# Patient Record
Sex: Male | Born: 1965
Health system: Southern US, Academic
[De-identification: ages and names within clinical notes are randomized; demographics above are authoritative.]

## PROBLEM LIST (undated history)

## (undated) ENCOUNTER — Telehealth
Attending: Student in an Organized Health Care Education/Training Program | Primary: Student in an Organized Health Care Education/Training Program

## (undated) ENCOUNTER — Encounter

## (undated) ENCOUNTER — Ambulatory Visit

## (undated) ENCOUNTER — Ambulatory Visit
Payer: MEDICAID | Attending: Student in an Organized Health Care Education/Training Program | Primary: Student in an Organized Health Care Education/Training Program

## (undated) ENCOUNTER — Telehealth: Attending: Clinical | Primary: Clinical

## (undated) ENCOUNTER — Encounter: Attending: Family | Primary: Family

## (undated) ENCOUNTER — Telehealth

## (undated) ENCOUNTER — Telehealth: Attending: Registered" | Primary: Registered"

## (undated) ENCOUNTER — Ambulatory Visit: Payer: MEDICAID

## (undated) ENCOUNTER — Encounter
Attending: Pharmacist Clinician (PhC)/ Clinical Pharmacy Specialist | Primary: Pharmacist Clinician (PhC)/ Clinical Pharmacy Specialist

## (undated) ENCOUNTER — Ambulatory Visit: Payer: MEDICAID | Attending: Addiction (Substance Use Disorder) | Primary: Addiction (Substance Use Disorder)

## (undated) ENCOUNTER — Encounter
Attending: Student in an Organized Health Care Education/Training Program | Primary: Student in an Organized Health Care Education/Training Program

## (undated) ENCOUNTER — Inpatient Hospital Stay: Payer: MEDICAID

## (undated) ENCOUNTER — Ambulatory Visit: Payer: MEDICAID | Attending: Clinical | Primary: Clinical

## (undated) ENCOUNTER — Ambulatory Visit
Attending: Student in an Organized Health Care Education/Training Program | Primary: Student in an Organized Health Care Education/Training Program

## (undated) ENCOUNTER — Inpatient Hospital Stay: Payer: Medicaid (Managed Care)

## (undated) ENCOUNTER — Ambulatory Visit: Attending: Addiction (Substance Use Disorder) | Primary: Addiction (Substance Use Disorder)

## (undated) ENCOUNTER — Ambulatory Visit
Payer: Medicaid (Managed Care) | Attending: Student in an Organized Health Care Education/Training Program | Primary: Student in an Organized Health Care Education/Training Program

## (undated) ENCOUNTER — Ambulatory Visit: Payer: MEDICAID | Attending: Registered" | Primary: Registered"

## (undated) ENCOUNTER — Encounter: Attending: Certified Registered" | Primary: Certified Registered"

## (undated) ENCOUNTER — Ambulatory Visit: Attending: Pharmacist | Primary: Pharmacist

## (undated) ENCOUNTER — Ambulatory Visit: Payer: MEDICAID | Attending: Urology | Primary: Urology

## (undated) ENCOUNTER — Ambulatory Visit: Payer: Medicaid (Managed Care)

## (undated) ENCOUNTER — Ambulatory Visit: Payer: MEDICAID | Attending: Acute Care | Primary: Acute Care

## (undated) ENCOUNTER — Telehealth: Payer: MEDICAID

## (undated) ENCOUNTER — Ambulatory Visit: Attending: Mental Health | Primary: Mental Health

## (undated) ENCOUNTER — Ambulatory Visit
Payer: Medicare (Managed Care) | Attending: Student in an Organized Health Care Education/Training Program | Primary: Student in an Organized Health Care Education/Training Program

## (undated) ENCOUNTER — Emergency Department: Admission: EM | Payer: MEDICAID

## (undated) DIAGNOSIS — F319 Bipolar disorder, unspecified: Secondary | ICD-10-CM

## (undated) DIAGNOSIS — I499 Cardiac arrhythmia, unspecified: Secondary | ICD-10-CM

## (undated) DIAGNOSIS — M009 Pyogenic arthritis, unspecified: Secondary | ICD-10-CM

## (undated) DIAGNOSIS — N400 Enlarged prostate without lower urinary tract symptoms: Secondary | ICD-10-CM

## (undated) DIAGNOSIS — M1990 Unspecified osteoarthritis, unspecified site: Secondary | ICD-10-CM

## (undated) DIAGNOSIS — T8859XA Other complications of anesthesia, initial encounter: Secondary | ICD-10-CM

## (undated) DIAGNOSIS — I10 Essential (primary) hypertension: Secondary | ICD-10-CM

## (undated) DIAGNOSIS — F1410 Cocaine abuse, uncomplicated: Secondary | ICD-10-CM

## (undated) DIAGNOSIS — R112 Nausea with vomiting, unspecified: Secondary | ICD-10-CM

## (undated) DIAGNOSIS — K219 Gastro-esophageal reflux disease without esophagitis: Secondary | ICD-10-CM

## (undated) DIAGNOSIS — I2510 Atherosclerotic heart disease of native coronary artery without angina pectoris: Secondary | ICD-10-CM

## (undated) DIAGNOSIS — B1920 Unspecified viral hepatitis C without hepatic coma: Secondary | ICD-10-CM

## (undated) DIAGNOSIS — F32A Depression, unspecified: Secondary | ICD-10-CM

## (undated) DIAGNOSIS — Z21 Asymptomatic human immunodeficiency virus [HIV] infection status: Secondary | ICD-10-CM

## (undated) DIAGNOSIS — B20 Human immunodeficiency virus [HIV] disease: Secondary | ICD-10-CM

## (undated) DIAGNOSIS — T783XXA Angioneurotic edema, initial encounter: Secondary | ICD-10-CM

## (undated) DIAGNOSIS — J40 Bronchitis, not specified as acute or chronic: Secondary | ICD-10-CM

## (undated) DIAGNOSIS — Z9889 Other specified postprocedural states: Secondary | ICD-10-CM

## (undated) DIAGNOSIS — A630 Anogenital (venereal) warts: Secondary | ICD-10-CM

## (undated) DIAGNOSIS — N179 Acute kidney failure, unspecified: Secondary | ICD-10-CM

## (undated) DIAGNOSIS — E785 Hyperlipidemia, unspecified: Secondary | ICD-10-CM

## (undated) DIAGNOSIS — J45909 Unspecified asthma, uncomplicated: Secondary | ICD-10-CM

## (undated) DIAGNOSIS — K439 Ventral hernia without obstruction or gangrene: Secondary | ICD-10-CM

## (undated) DIAGNOSIS — R001 Bradycardia, unspecified: Secondary | ICD-10-CM

## (undated) DIAGNOSIS — R7303 Prediabetes: Secondary | ICD-10-CM

## (undated) DIAGNOSIS — F329 Major depressive disorder, single episode, unspecified: Secondary | ICD-10-CM

## (undated) DIAGNOSIS — I471 Supraventricular tachycardia, unspecified: Secondary | ICD-10-CM

## (undated) DIAGNOSIS — T4145XA Adverse effect of unspecified anesthetic, initial encounter: Secondary | ICD-10-CM

## (undated) HISTORY — PX: HERNIA REPAIR: SHX51

## (undated) HISTORY — PX: TOE SURGERY: SHX1073

---
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
## 2005-05-18 ENCOUNTER — Emergency Department: Payer: Self-pay | Admitting: Emergency Medicine

## 2008-01-03 ENCOUNTER — Emergency Department: Payer: Self-pay | Admitting: Emergency Medicine

## 2008-06-01 ENCOUNTER — Emergency Department: Payer: Self-pay | Admitting: Emergency Medicine

## 2008-09-07 ENCOUNTER — Emergency Department: Payer: Self-pay | Admitting: Emergency Medicine

## 2008-09-10 ENCOUNTER — Other Ambulatory Visit: Payer: Self-pay

## 2008-09-10 ENCOUNTER — Observation Stay: Payer: Self-pay | Admitting: Internal Medicine

## 2008-09-14 ENCOUNTER — Emergency Department: Payer: Self-pay | Admitting: Emergency Medicine

## 2009-02-03 ENCOUNTER — Emergency Department: Payer: Self-pay | Admitting: Emergency Medicine

## 2009-03-12 ENCOUNTER — Emergency Department: Payer: Self-pay | Admitting: Emergency Medicine

## 2009-04-16 ENCOUNTER — Emergency Department: Payer: Self-pay | Admitting: Emergency Medicine

## 2009-06-22 ENCOUNTER — Emergency Department: Payer: Self-pay | Admitting: Emergency Medicine

## 2009-07-12 ENCOUNTER — Ambulatory Visit: Payer: Self-pay

## 2011-09-25 ENCOUNTER — Emergency Department: Payer: Self-pay | Admitting: Internal Medicine

## 2011-10-27 ENCOUNTER — Emergency Department: Payer: Self-pay | Admitting: Emergency Medicine

## 2012-01-21 ENCOUNTER — Emergency Department: Payer: Self-pay | Admitting: Unknown Physician Specialty

## 2012-04-16 ENCOUNTER — Emergency Department: Payer: Self-pay

## 2012-04-16 LAB — URINALYSIS, COMPLETE
Bacteria: NONE SEEN
Bilirubin,UR: NEGATIVE
Glucose,UR: NEGATIVE mg/dL (ref 0–75)
Ph: 5 (ref 4.5–8.0)
Protein: NEGATIVE
Squamous Epithelial: NONE SEEN

## 2012-04-29 ENCOUNTER — Emergency Department: Payer: Self-pay | Admitting: Emergency Medicine

## 2012-06-21 ENCOUNTER — Emergency Department: Payer: Self-pay | Admitting: Emergency Medicine

## 2013-05-08 ENCOUNTER — Inpatient Hospital Stay: Payer: Self-pay | Admitting: Internal Medicine

## 2013-05-08 LAB — COMPREHENSIVE METABOLIC PANEL
Albumin: 4.4 g/dL (ref 3.4–5.0)
Alkaline Phosphatase: 87 U/L (ref 50–136)
Anion Gap: 6 — ABNORMAL LOW (ref 7–16)
Co2: 26 mmol/L (ref 21–32)
EGFR (African American): 38 — ABNORMAL LOW
EGFR (Non-African Amer.): 33 — ABNORMAL LOW
Glucose: 145 mg/dL — ABNORMAL HIGH (ref 65–99)
Total Protein: 9.1 g/dL — ABNORMAL HIGH (ref 6.4–8.2)

## 2013-05-08 LAB — URINALYSIS, COMPLETE
Leukocyte Esterase: NEGATIVE
Nitrite: NEGATIVE

## 2013-05-08 LAB — TROPONIN I
Troponin-I: <0.02 ng/mL
Troponin-I: <0.02 ng/mL

## 2013-05-08 LAB — CBC
HGB: 16.9 g/dL (ref 13.0–18.0)
MCHC: 33.1 g/dL (ref 32.0–36.0)
MCV: 101 fL — ABNORMAL HIGH (ref 80–100)
Platelet: 204 10*3/uL (ref 150–440)
WBC: 15.5 10*3/uL — ABNORMAL HIGH (ref 3.8–10.6)

## 2013-05-08 LAB — CK TOTAL AND CKMB (NOT AT ARMC)
CK, Total: 396 U/L — ABNORMAL HIGH (ref 35–232)
CK-MB: 4.2 ng/mL — ABNORMAL HIGH (ref 0.5–3.6)

## 2013-05-09 DIAGNOSIS — I059 Rheumatic mitral valve disease, unspecified: Secondary | ICD-10-CM

## 2013-05-09 LAB — BASIC METABOLIC PANEL
Anion Gap: 5 — ABNORMAL LOW (ref 7–16)
BUN: 19 mg/dL — ABNORMAL HIGH (ref 7–18)
Calcium, Total: 8.6 mg/dL (ref 8.5–10.1)
Chloride: 108 mmol/L — ABNORMAL HIGH (ref 98–107)
EGFR (African American): >60
Sodium: 139 mmol/L (ref 136–145)

## 2013-05-09 LAB — CK-MB
CK-MB: 2.6 ng/mL (ref 0.5–3.6)
CK-MB: 2.1 ng/mL (ref 0.5–3.6)

## 2013-05-09 LAB — CBC WITH DIFFERENTIAL/PLATELET
Eosinophil #: 0.1 10*3/uL (ref 0.0–0.7)
HCT: 41.4 % (ref 40.0–52.0)
HGB: 14 g/dL (ref 13.0–18.0)
Lymphocyte %: 33.1 %
MCH: 33.8 pg (ref 26.0–34.0)
MCHC: 33.7 g/dL (ref 32.0–36.0)
Monocyte %: 4.5 %
Neutrophil #: 5 10*3/uL (ref 1.4–6.5)
Platelet: 158 10*3/uL (ref 150–440)
RDW: 13.1 % (ref 11.5–14.5)

## 2013-05-09 LAB — TROPONIN I: Troponin-I: <0.02 ng/mL

## 2013-05-09 LAB — CK: CK, Total: 263 U/L — ABNORMAL HIGH (ref 35–232)

## 2013-05-10 LAB — BASIC METABOLIC PANEL
Calcium, Total: 8.3 mg/dL — ABNORMAL LOW (ref 8.5–10.1)
Chloride: 108 mmol/L — ABNORMAL HIGH (ref 98–107)
Co2: 27 mmol/L (ref 21–32)
Creatinine: 1.06 mg/dL (ref 0.60–1.30)
EGFR (African American): >60
EGFR (Non-African Amer.): >60
Glucose: 89 mg/dL (ref 65–99)
Osmolality: 275 (ref 275–301)
Potassium: 3.6 mmol/L (ref 3.5–5.1)
Sodium: 138 mmol/L (ref 136–145)

## 2013-08-22 ENCOUNTER — Emergency Department: Payer: Self-pay | Admitting: Emergency Medicine

## 2013-11-25 ENCOUNTER — Emergency Department: Payer: Self-pay | Admitting: Emergency Medicine

## 2014-04-19 ENCOUNTER — Emergency Department: Payer: Self-pay | Admitting: Emergency Medicine

## 2014-05-25 IMAGING — CT CT HEAD WITHOUT CONTRAST
2 series · 16 of 30 positions shown, 20 images · non-contrast
Comparison: none

REASON FOR EXAM: hit in head w/ towel dispenser
COMMENTS:

PROCEDURE:     CT  - CT HEAD WITHOUT CONTRAST  - June 21, 2012  [DATE]
RESULT:     Comparison:  None
TECHNIQUE: Multiple axial images from the foramen magnum to the vertex were
obtained without IV contrast.

[Series 2: without · axial · non-contrast · 0.45mm/px · z∈[-50,+70]mm · 13 of 29 slices shown, 17 images]
[im 3/29  brain]
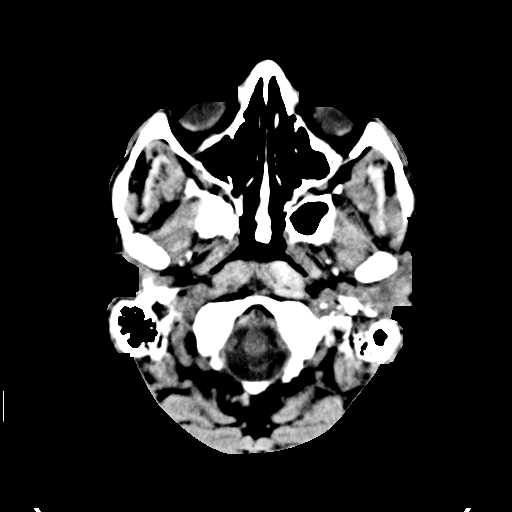
[im 3/29  bone]
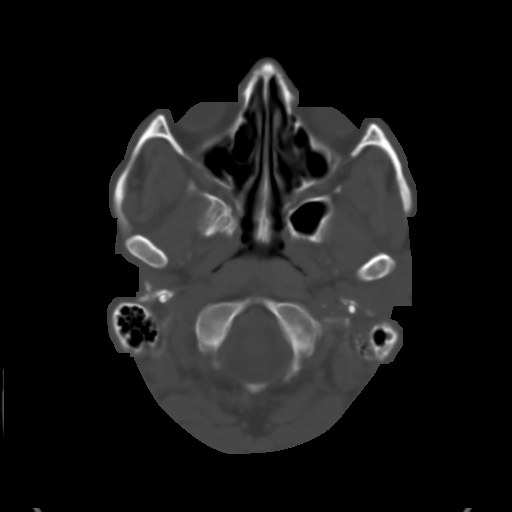
[im 5/29  brain]
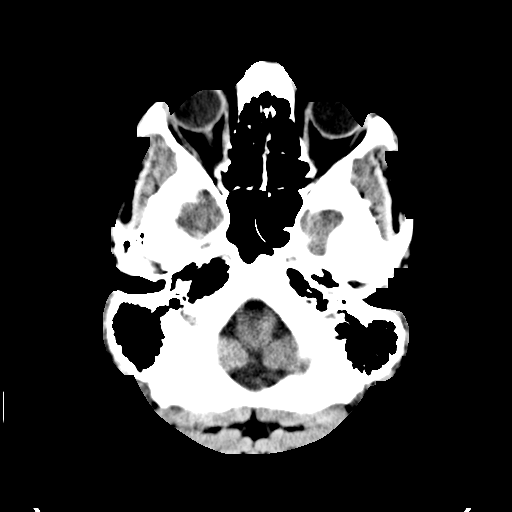
[im 7/29  brain]
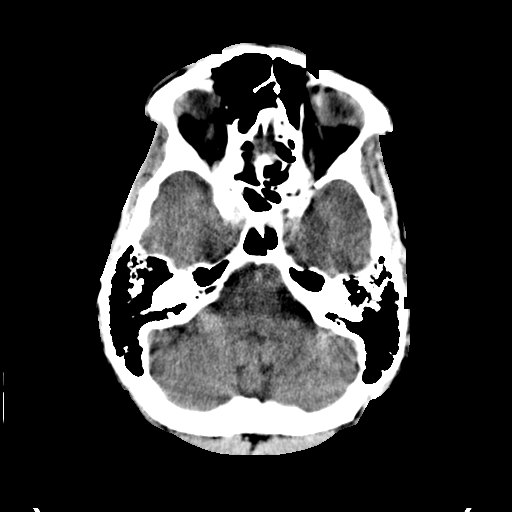
[im 9/29  brain]
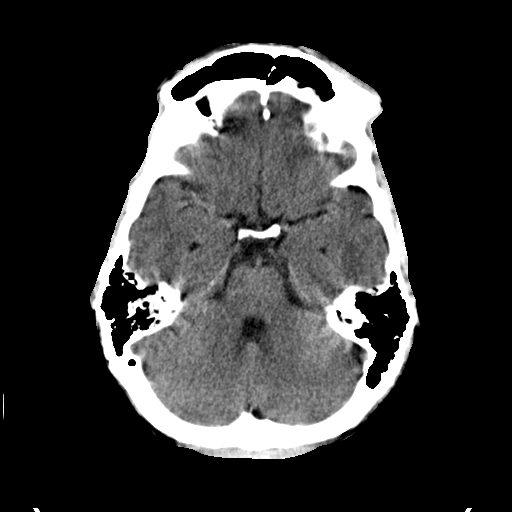
[im 11/29  brain]
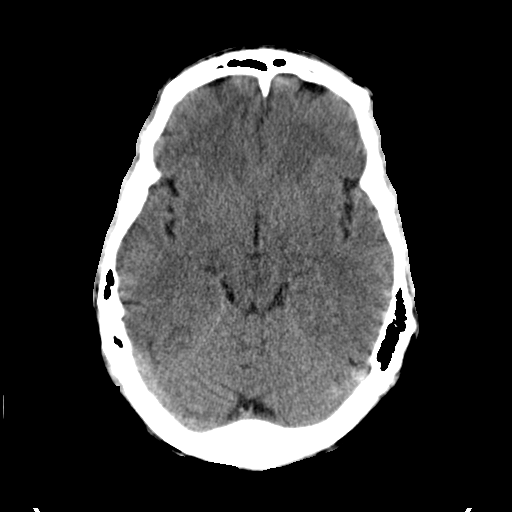
[im 11/29  bone]
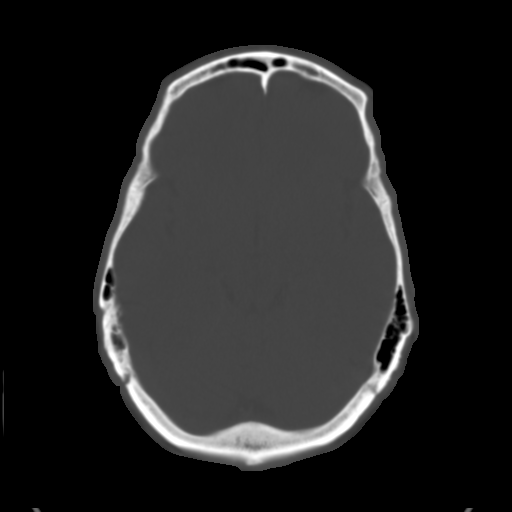
[im 13/29  brain]
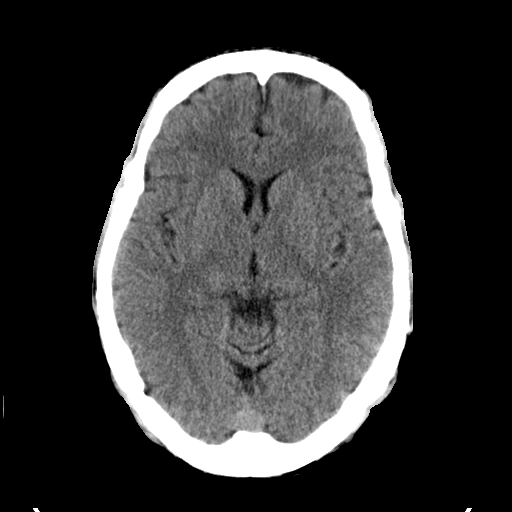
[im 15/29  brain]
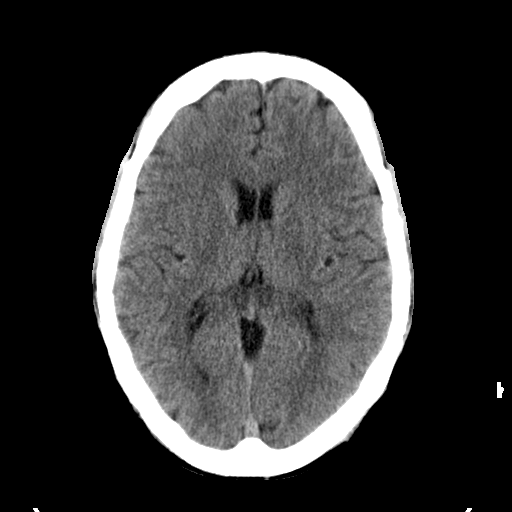
[im 17/29  brain]
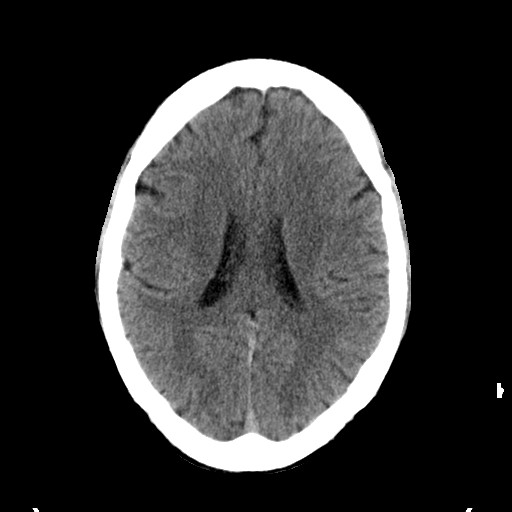
[im 19/29  brain]
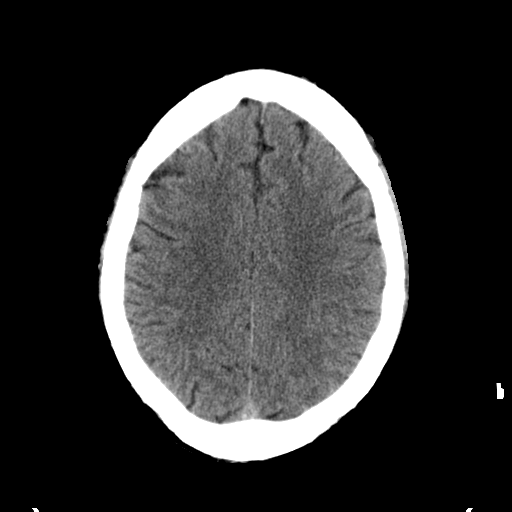
[im 19/29  bone]
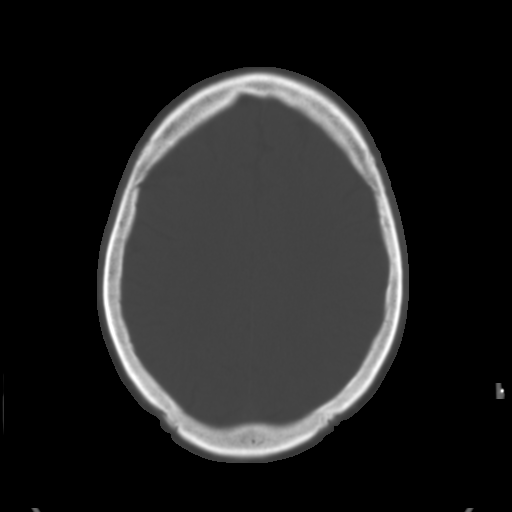
[im 21/29  brain]
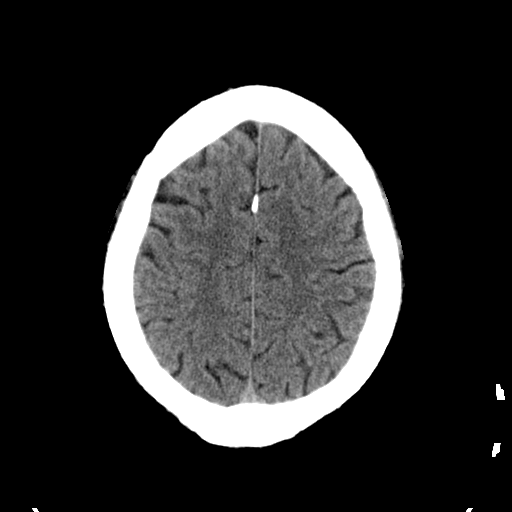
[im 23/29  brain]
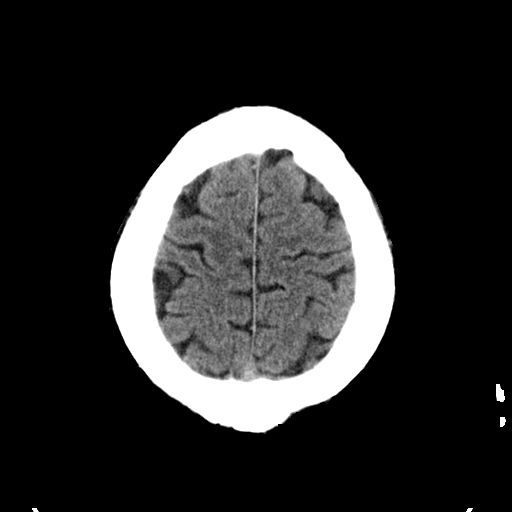
[im 25/29  brain]
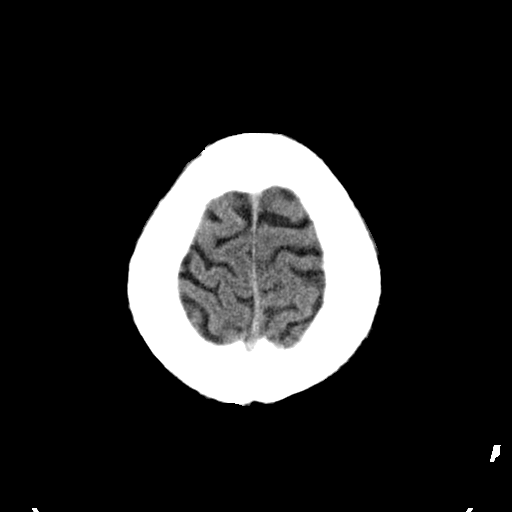
[im 27/29  brain]
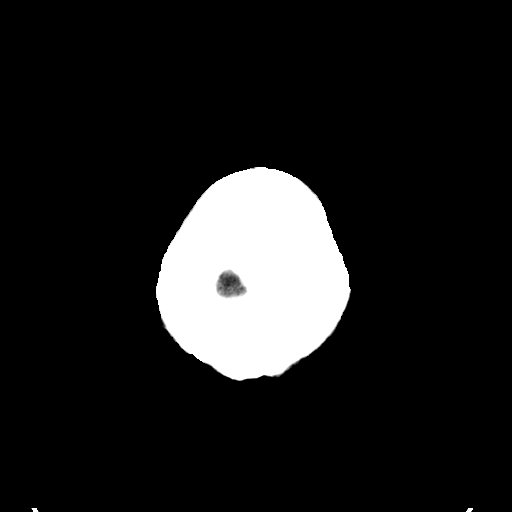
[im 27/29  bone]
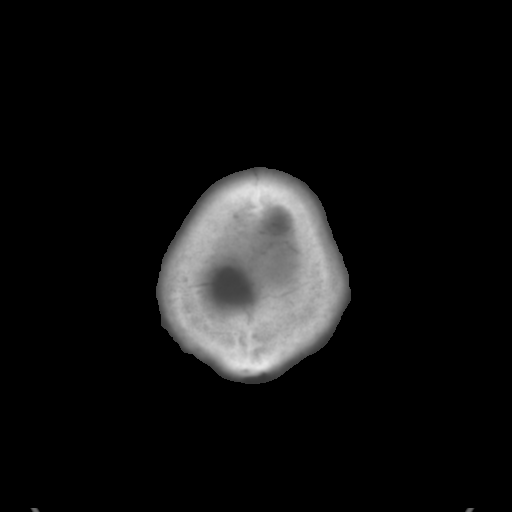

[Series 3: bone · axial · 0.45mm/px · z∈[-50,-10]mm · 3 of 29 slices shown]
[im 3/29  bone]
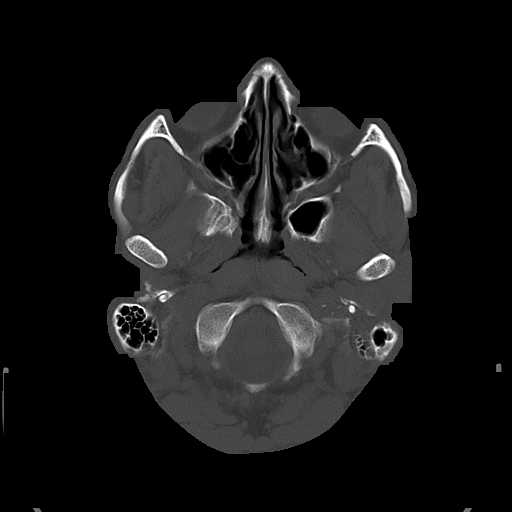
[im 7/29  bone]
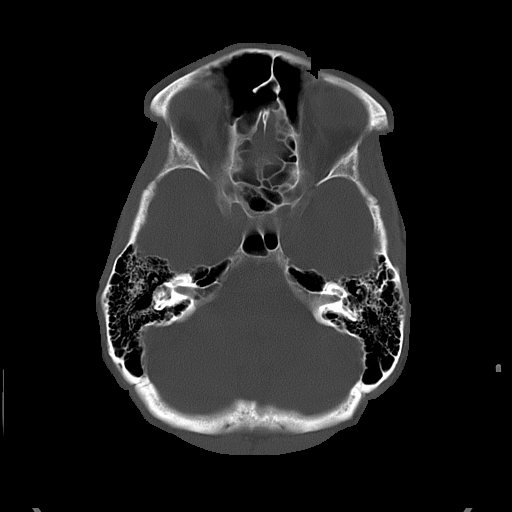
[im 11/29  bone]
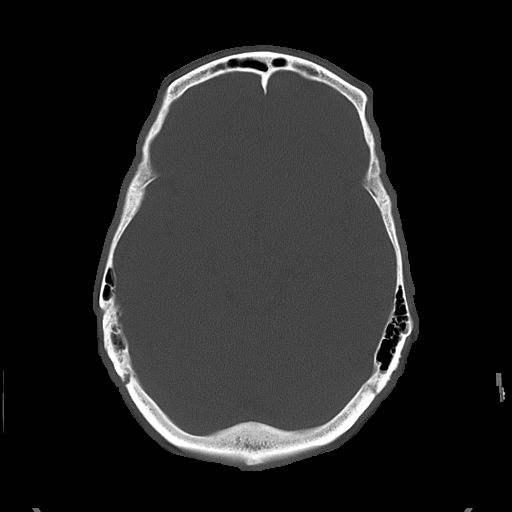

[16 of 30 positions shown; findings below may reference images not displayed]

FINDINGS: There is no evidence of mass effect, midline shift, or extra-axial fluid
collections.  There is no evidence of a space-occupying lesion or
intracranial hemorrhage. There is no evidence of a cortical-based area of
acute infarction.

The ventricles and sulci are appropriate for the patient's age. The basal
cisterns are patent.

Visualized portions of the orbits are unremarkable. The visualized portions
of the paranasal sinuses and mastoid air cells are unremarkable.

The osseous structures are unremarkable.
IMPRESSION: No acute intracranial process.

[REDACTED]

## 2014-10-25 DIAGNOSIS — M1990 Unspecified osteoarthritis, unspecified site: Secondary | ICD-10-CM | POA: Insufficient documentation

## 2015-01-06 ENCOUNTER — Emergency Department: Payer: Self-pay | Admitting: Student

## 2015-01-06 LAB — COMPREHENSIVE METABOLIC PANEL
ANION GAP: 5 — AB (ref 7–16)
AST: 33 U/L (ref 15–37)
Albumin: 3.7 g/dL (ref 3.4–5.0)
Alkaline Phosphatase: 84 U/L
BILIRUBIN TOTAL: 0.3 mg/dL (ref 0.2–1.0)
BUN: 16 mg/dL (ref 7–18)
CALCIUM: 9 mg/dL (ref 8.5–10.1)
CO2: 29 mmol/L (ref 21–32)
Chloride: 107 mmol/L (ref 98–107)
Creatinine: 1.28 mg/dL (ref 0.60–1.30)
EGFR (African American): >60
Glucose: 111 mg/dL — ABNORMAL HIGH (ref 65–99)
OSMOLALITY: 283 (ref 275–301)
Potassium: 3.9 mmol/L (ref 3.5–5.1)
SGPT (ALT): 63 U/L
SODIUM: 141 mmol/L (ref 136–145)
Total Protein: 7.5 g/dL (ref 6.4–8.2)

## 2015-01-06 LAB — URINALYSIS, COMPLETE
BILIRUBIN, UR: NEGATIVE
Bacteria: NONE SEEN
Glucose,UR: NEGATIVE mg/dL (ref 0–75)
KETONE: NEGATIVE
Leukocyte Esterase: NEGATIVE
Nitrite: NEGATIVE
PH: 5 (ref 4.5–8.0)
Protein: NEGATIVE
RBC,UR: 4 /HPF (ref 0–5)
SPECIFIC GRAVITY: 1.023 (ref 1.003–1.030)
SQUAMOUS EPITHELIAL: NONE SEEN
WBC UR: 1 /HPF (ref 0–5)

## 2015-01-06 LAB — CBC WITH DIFFERENTIAL/PLATELET
BASOS ABS: 0 10*3/uL (ref 0.0–0.1)
Basophil %: 0.6 %
EOS ABS: 0.1 10*3/uL (ref 0.0–0.7)
Eosinophil %: 2.8 %
HCT: 44.6 % (ref 40.0–52.0)
HGB: 15 g/dL (ref 13.0–18.0)
Lymphocyte #: 2.6 10*3/uL (ref 1.0–3.6)
Lymphocyte %: 55.2 %
MCH: 33.1 pg (ref 26.0–34.0)
MCHC: 33.5 g/dL (ref 32.0–36.0)
MCV: 99 fL (ref 80–100)
MONO ABS: 0.3 x10 3/mm (ref 0.2–1.0)
Monocyte %: 7.4 %
NEUTROS PCT: 34 %
Neutrophil #: 1.6 10*3/uL (ref 1.4–6.5)
PLATELETS: 221 10*3/uL (ref 150–440)
RBC: 4.51 10*6/uL (ref 4.40–5.90)
RDW: 13 % (ref 11.5–14.5)
WBC: 4.7 10*3/uL (ref 3.8–10.6)

## 2015-01-06 LAB — TROPONIN I: Troponin-I: <0.02 ng/mL

## 2015-01-15 ENCOUNTER — Emergency Department: Payer: Self-pay | Admitting: Physician Assistant

## 2015-02-07 ENCOUNTER — Emergency Department: Payer: Self-pay | Admitting: Emergency Medicine

## 2015-02-17 ENCOUNTER — Emergency Department: Payer: Self-pay | Admitting: Emergency Medicine

## 2015-02-18 ENCOUNTER — Emergency Department: Payer: Self-pay | Admitting: Emergency Medicine

## 2015-04-08 NOTE — H&P (Signed)
PATIENT NAME:  Roy [name], Roy [name] MR#:  [id number] DATE OF BIRTH:  09/02/1966  DATE OF ADMISSION:  05/08/2013  REFERRING PHYSICIAN: Dorothea Glassman, MD  PRIMARY CARE PHYSICIAN: At [facility].  INFECTIOUS DISEASE PHYSICIAN: At [facility].   CHIEF COMPLAINT: "I passed out."  HISTORY OF PRESENT ILLNESS: The patient is a pleasant 49 year old African American male with history of hepatitis C, HIV, on antiviral medication, who presents with above chief complaint. The patient stated that overall he has been doing well. Of note, he was diagnosed with hypertension recently and started on a low-dose ACE inhibitor, which he ran out after a month and has not taken it for a week. He has occasional positional dizziness, but this morning was mowing the lawn and passed out without having any shortness of breath, pains in the chest or palpitations. He possibly passed out for a few minutes, as much as 5 minutes per him, and a friend who was also helping called EMS. Here, he is noted to have renal failure with creatinine greater than 2. CT of the head is negative. He stated that he was presyncopal at a recent office visit at [facility] and was told that he was dehydrated of note. He has no fevers. He did have up to 5  episodes of nausea, vomiting and diarrhea. This all happened post syncope and none pre-syncope. Hospitalist services were contacted for further evaluation and management.   PAST MEDICAL HISTORY: Chronic hepatitis C, HIV, on antiviral therapy, hypertension, depression.   PAST SURGICAL HISTORY: History of hernia repair x 1 and a foot surgery on the left.   SOCIAL HISTORY: He smokes 15 cigarettes a day. Drinks about 2 beers a day, more over the weekends of up to a 6-pack. No drug use. Is on disability.   FAMILY HISTORY: Mom with hypertension. Brother with MI. Mom also has palpitations, some heart issues.   ALLERGIES: No known drug allergies.   OUTPATIENT MEDICATIONS: Aripiprazole 10 mg daily, aspirin  325 mg as needed for headache, citalopram 20 mg daily, Complera 1 tab once a day, lisinopril 5 mg daily (last taken about a week ago).   REVIEW OF SYSTEMS:    CONSTITUTIONAL: No headache. No fatigue. Has no weight changes. Has some night sweats for 6 months off and on.  EYES: Has chronic blurry vision. Wears glasses.  EARS, NOSE, THROAT: No tinnitus or hearing loss. Has allergies seasonally.  RESPIRATORY: Denies cough, shortness of breath or wheezing. No painful respirations.  CARDIOVASCULAR: Denies chest pain. States that he has low heart rate at baseline. Recently diagnosed with blood pressure issues. Has occasional skipped beats. No dyspnea on exertion. GASTROINTESTINAL: Nausea, vomiting and diarrhea post syncope, resolved now. No abdominal pain. Has history of abdominal hernia.  GENITOURINARY: Denies frequency or incontinence. Occasionally has difficulty starting urine passage.  ENDOCRINE: No polyuria or nocturia.  HEMATOLOGIC AND LYMPHATIC: Denies anemia or easy bruising.  SKIN: Denies rashes.  MUSCULOSKELETAL: Denies focal weakness, numbness or strokes.  PSYCHIATRIC: Denies depression or anxiety.   PHYSICAL EXAMINATION:   VITAL SIGNS: Temperature on arrival 97.8. Initial pulse rate 57. While I was in the room, it was high 40s, low 50s. Initial blood pressure 128/76. Oxygen saturation 97% on room air.  GENERAL: The patient is a well-developed [place] American male lying in bed in no obvious distress.  HEENT: Normocephalic, atraumatic. Pupils are equal and reactive. Anicteric sclerae. Extraocular muscles intact. Moist mucous membranes.  NECK: Supple. No thyroid tenderness. No cervical lymphadenopathy.  CARDIOVASCULAR: S1,  S2, bradycardic. No murmurs, rubs or gallops.  LUNGS: Clear to auscultation without wheezing, rhonchi or rales.  ABDOMEN: Soft, nontender, nondistended. Positive bowel sounds in all quadrants.  EXTREMITIES: No significant lower extremity edema.  NEUROLOGIC: Cranial  nerves II through XII grossly intact. Strength is 5 out of 5 in all extremities. Sensation is intact to light touch.  PSYCHIATRIC: Awake, alert, oriented x 3. Pleasant, cooperative, conversant.  SKIN: No obvious rashes.   LABORATORY, DIAGNOSTIC AND RADIOLOGICAL DATA: Glucose 145, BUN 16, creatinine 2.26, sodium 138. LFTs: AST 39, total protein 9.1, otherwise within normal limits. CK total 396, CK-MB 4.2, troponin negative. WBC 15.5, hemoglobin 16.9, platelets 204. Urinalysis not suggestive of infection. CT of head without contrast: No acute intracranial process. EKG: Sinus bradycardia, rate 54, no acute ST elevations or depressions.   ASSESSMENT AND PLAN: We have a 49 year old African American male with history of chronic hepatitis C, human immunodeficiency virus, on HAART and per him has been compliant but his medication and last viral load was undetectable, history of recently diagnosed hypertension who took a month of lisinopril, off of it for about a week, who presents with syncopal episode. He states he was passed out for 5 minutes or so and currently is neurologically intact and had a CT of the head which was negative for acute stroke. He does appear to have new  renal failure, as his creatinine on March 31 per [facility] record was 1.23, currently is in the low 2's. He was also told recently that he was dehydrated. It is possible that he passed out because of renal failure, also possibly bradycardia. He does appear to have sinus bradycardia, but states that this has been ongoing and chronic. At this point, we would admit the patient to the hospital for syncopal workup. We would start the patient on intravenous fluids, hold the lisinopril, admit the patient with remote telemetry to look for significant arrhythmias, obtain an echocardiogram and ultrasound of the carotids. He does not have any chest pains or shortness of breath, but does have positional dizziness. It is possible that the patient is dehydrated and  therefore passed out. We will check orthostatic vital signs; however, he has had 2 liters of intravenous fluids thus far. He does have mild rhabdomyolysis with CK total of 396, which is slightly elevated and therefore, I would go ahead and trend them. He has no chest pains. I would follow the CK-MB and troponins as well. He does have mild leukocytosis, which could be reactive. He has no fevers, no gastrointestinal symptoms prior to passing out, no rashes or urinary tract infection symptoms or respiratory symptoms otherwise. It might be reactive and we will be monitoring it. Although infection is a possibility, he does not appear to be significantly immunocompromised, as he states his viral load from his HIV is  below detection and he has been compliant with medication. We would monitor the WBC tomorrow and I would not start an antibiotic at this point. We would also follow up with his GFR in the morning. Resume his depression medications as well as his HIV medication. I will  start him on heparin for deep vein thrombosis prophylaxis.   TOTAL TIME SPENT: 55 minutes.   CODE STATUS: The patient is full code.    ____________________________ Krystal Eaton, MD sa:jm D: 05/08/2013 19:08:37 ET T: 05/08/2013 19:48:20 ET JOB#: [id number]  cc: Krystal Eaton, MD, <Dictator> Krystal Eaton MD ELECTRONICALLY SIGNED 05/19/2013 12:40

## 2015-04-08 NOTE — Discharge Summary (Signed)
PATIENT NAME:  Roy [name], Roy [name] MR#:  [id number] DATE OF BIRTH:  Jul 16, 1966  DATE OF ADMISSION:  05/08/2013  DATE OF DISCHARGE:  05/10/2013  PRIMARY CARE PHYSICIAN:  Non-local.  DISCHARGE DIAGNOSIS:  1.  Syncope, possibly due to acute renal failure. 2.  Human immunodeficiency virus.  3.  Tobacco abuse.   CONDITION:  Stable.   CODE STATUS:  FULL CODE.   HOME MEDICATIONS:   1.  Aripiprazole 10 mg p.o. daily.  2.  Complera 200 mg/25 mg/300 mg p.o. tablets once a day.  3.  Citalopram 20 mg p.o. daily.  4.  Norvasc 10 mg p.o. daily.   DIET:  Low sodium diet.   ACTIVITY:  As tolerated.   FOLLOWUP CARE:   1.  Follow with PCP within 1 to 2 weeks.  2.  Smoking cessation.   REASON FOR ADMISSION:  Passed out.  HOSPITAL COURSE: The patient is 49 year old African-American male with a history of hepatitis C and HIV, on antiviral medication, presented in the ED with passing out. The patient also has occasional positional dizziness. The patient passed out while he was mowing the lawn.  The patient's CAT scan of head was negative in the ED. For detailed History and Physical Examination, please refer to the admission note dictated by Dr. Jacques Navy.   LABS:  On admission date, the patient's laboratory data are as follows:  BUN 16, creatinine 2.26, sodium 138, glucose 140, CK 396. Troponin negative. WBC 15.5, hemoglobin 16.9. Urinalysis not suggestive of infection. EKG shows sinus bradycardia at 54.  HOSPITAL COURSE:  The patient was admitted for syncope, possibly due to sinus bradycardia or acute renal failure. After admission, the patient's lisinopril was on hold due to acute renal failure. In addition, patient has been treated with IV fluid support. His renal function has improved to normal range. In addition, patient got an echocardiogram which showed essentially normal study, with ejection fraction 50% to 55%. In addition, the patient's leukocytosis has improved. The patient has no complaints  since admission. His vital signs are stable. He is clinically stable, and will be discharged home today. I discussed the patient's discharge plan with the patient and the case manager.   TIME SPENT:  About 32 minutes.    ____________________________ Shaune Pollack, MD qc:mr D: 05/10/2013 18:41:00 ET T: 05/10/2013 21:58:52 ET JOB#: [id number]  cc: Shaune Pollack, MD, <Dictator> Shaune Pollack MD ELECTRONICALLY SIGNED 05/11/2013 15:00

## 2015-04-17 ENCOUNTER — Other Ambulatory Visit: Payer: Self-pay

## 2015-04-17 ENCOUNTER — Emergency Department: Payer: Medicaid Other

## 2015-04-17 ENCOUNTER — Emergency Department
Admission: EM | Admit: 2015-04-17 | Discharge: 2015-04-17 | Disposition: A | Discharge status: Home / Self Care | Payer: Medicaid Other | Attending: Internal Medicine | Admitting: Internal Medicine

## 2015-04-17 ENCOUNTER — Encounter: Payer: Self-pay | Admitting: Emergency Medicine

## 2015-04-17 DIAGNOSIS — I10 Essential (primary) hypertension: Secondary | ICD-10-CM | POA: Insufficient documentation

## 2015-04-17 DIAGNOSIS — R079 Chest pain, unspecified: Secondary | ICD-10-CM | POA: Diagnosis present

## 2015-04-17 DIAGNOSIS — F419 Anxiety disorder, unspecified: Secondary | ICD-10-CM | POA: Diagnosis not present

## 2015-04-17 DIAGNOSIS — Z720 Tobacco use: Secondary | ICD-10-CM | POA: Insufficient documentation

## 2015-04-17 DIAGNOSIS — F17200 Nicotine dependence, unspecified, uncomplicated: Secondary | ICD-10-CM

## 2015-04-17 HISTORY — DX: Human immunodeficiency virus (HIV) disease: B20

## 2015-04-17 HISTORY — DX: Unspecified viral hepatitis C without hepatic coma: B19.20

## 2015-04-17 HISTORY — DX: Essential (primary) hypertension: I10

## 2015-04-17 HISTORY — DX: Asymptomatic human immunodeficiency virus (hiv) infection status: Z21

## 2015-04-17 HISTORY — DX: Unspecified osteoarthritis, unspecified site: M19.90

## 2015-04-17 LAB — COMPREHENSIVE METABOLIC PANEL
ALT: 46 U/L (ref 17–63)
AST: 38 U/L (ref 15–41)
Albumin: 4.4 g/dL (ref 3.5–5.0)
Alkaline Phosphatase: 73 U/L (ref 38–126)
Anion gap: 9 (ref 5–15)
BUN: 21 mg/dL — ABNORMAL HIGH (ref 6–20)
CALCIUM: 9.4 mg/dL (ref 8.9–10.3)
CO2: 26 mmol/L (ref 22–32)
CREATININE: 1.31 mg/dL — AB (ref 0.61–1.24)
Chloride: 103 mmol/L (ref 101–111)
GFR calc Af Amer: >60 mL/min (ref >60)
GFR calc non Af Amer: >60 mL/min (ref >60)
Glucose, Bld: 116 mg/dL — ABNORMAL HIGH (ref 65–99)
Potassium: 3.6 mmol/L (ref 3.5–5.1)
SODIUM: 138 mmol/L (ref 135–145)
TOTAL PROTEIN: 7.8 g/dL (ref 6.5–8.1)
Total Bilirubin: 0.9 mg/dL (ref 0.3–1.2)

## 2015-04-17 LAB — CBC
HEMATOCRIT: 43.8 % (ref 40.0–52.0)
Hemoglobin: 14.9 g/dL (ref 13.0–18.0)
MCH: 33.5 pg (ref 26.0–34.0)
MCHC: 34.1 g/dL (ref 32.0–36.0)
MCV: 98.3 fL (ref 80.0–100.0)
Platelets: 200 10*3/uL (ref 150–440)
RBC: 4.45 MIL/uL (ref 4.40–5.90)
RDW: 13.3 % (ref 11.5–14.5)
WBC: 3.7 10*3/uL — ABNORMAL LOW (ref 3.8–10.6)

## 2015-04-17 LAB — TROPONIN I

## 2015-04-17 MED ORDER — KETOROLAC TROMETHAMINE 10 MG PO TABS: 10.0000 mg | ORAL_TABLET | Freq: Three times a day (TID) | ORAL | Status: AC | PRN

## 2015-04-17 MED ORDER — KETOROLAC TROMETHAMINE 10 MG PO TABS
ORAL_TABLET | ORAL | Status: AC
  Administered 2015-04-17: 18:00:00
  Filled 2015-04-17: qty 1

## 2015-04-17 MED ORDER — KETOROLAC TROMETHAMINE 10 MG PO TABS: 10.0000 mg | ORAL_TABLET | Freq: Once | ORAL | Status: AC

## 2015-04-17 NOTE — ED Notes (Signed)
Pt states that he started having chest pain last night, states that he has had this before and states that when he checked his heart rate last night it was 47, pt states that it feels like it is skipping, ekg performed in triage with a rate of 57, pulse ox reading pulse of 90. No distress noted at this time

## 2015-04-17 NOTE — ED Provider Notes (Signed)
[facility] Emergency Department Provider Note    ____________________________________________  Time seen: 5:35 PM  I have reviewed the triage vital signs and the nursing notes.   HISTORY  Chief Complaint Chest Pain       HPI Herschell Ferrebee Sr. is a 49 y.o. male with a past medical history of hypertension HIV hepatitis C and arthritis who presents to the emergency department with acute onset of sharp chest pain that started at midnight last night while he was smoking a cigarette. He has not smoked in 5 months. He has been using the patch to stop smoking. He felt very upset and smoked a cigarette. After smoke the cigarette he started to have shooting sharp chest pain over his left breast area. He also had associated pounding heart rate. He denies any shortness of breath. He denies drug use. The pain was sharp. Located over the left breast area. Lasted 2 minutes. That has been intermittently recurrent during the day. This is severity was 5 out of 10. The quality was stabbing. The modifying factor was related to smoking. There were no other associated symptoms.      Past Medical History  Diagnosis Date  . HTN (hypertension)   . HIV (human immunodeficiency virus infection)   . Arthritis   . Hepatitis C     There are no active problems to display for this patient.   Past Surgical History  Procedure Laterality Date  . Hernia repair      No current outpatient prescriptions on file.  Allergies Review of patient's allergies indicates no known allergies.  No family history on file.  Social History History  Substance Use Topics  . Smoking status: Current Every Day Smoker    Types: Cigarettes  . Smokeless tobacco: Not on file  . Alcohol Use: No    Review of Systems  Constitutional: Negative for fever. Eyes: Negative for visual changes. ENT: Negative for sore throat. Cardiovascular: Positive for chest pain. That is sharp Respiratory:  Negative for shortness of breath. Gastrointestinal: Negative for abdominal pain, vomiting and diarrhea. Genitourinary: Negative for dysuria. Musculoskeletal: Negative for back pain. Skin: Negative for rash. Neurological: Negative for headaches, focal weakness or numbness.  10-point ROS otherwise negative.  ____________________________________________   PHYSICAL EXAM:  VITAL SIGNS: ED Triage Vitals  Enc Vitals Group     BP 04/17/15 1511 139/90 mmHg     Pulse Rate 04/17/15 1511 91     Resp --      Temp 04/17/15 1511 98.5 F (36.9 C)     Temp Source 04/17/15 1511 Oral     SpO2 04/17/15 1511 97 %     Weight 04/17/15 1511 205 lb (92.987 kg)     Height 04/17/15 1511 5\' 9"  (1.753 m)     Head Cir --      Peak Flow --      Pain Score --      Pain Loc --      Pain Edu? --      Excl. in GC? --    Vital signs are noted and are stable  Constitutional: Alert and oriented. Well appearing and in no distress. Eyes: Conjunctivae are normal. PERRL. Normal extraocular movements. ENT   Head: Normocephalic and atraumatic.   Nose: No congestion/rhinnorhea.   Mouth/Throat: Mucous membranes are moist.   Neck: No stridor. Hematological/Lymphatic/Immunilogical: No cervical lymphadenopathy. Cardiovascular: Normal rate, regular rhythm. Normal and symmetric distal pulses are present in all extremities. No murmurs, rubs, or gallops.  Respiratory: Normal respiratory effort without tachypnea nor retractions. Breath sounds are clear and equal bilaterally. No wheezes/rales/rhonchi. Gastrointestinal: Soft and nontender. No distention. No abdominal bruits. There is no CVA tenderness. Genitourinary: Deferred Musculoskeletal: Nontender with normal range of motion in all extremities. No joint effusions.  No lower extremity tenderness nor edema. Neurologic:  Normal speech and language. No gross focal neurologic deficits are appreciated. Speech is normal. No gait instability. Skin:  Skin is warm,  dry and intact. No rash noted. Psychiatric: Mood and affect are normal. Speech and behavior are normal. Patient exhibits appropriate insight and judgment.  ____________________________________________   EKG  ED ECG REPORT   Date: 04/17/2015  EKG Time: 1503  Rate: 57  Rhythm:nsr  Axis: Normal  Intervals:none  ST&T Change: Specific ST-T wave changes, moderate voltage for LVH   ____________________________________________    RADIOLOGY  Normal chest x-ray  ____________________________________________   PROCEDURES  Procedure(s) performed: None  Critical Care performed: No  ____________________________________________   INITIAL IMPRESSION / ASSESSMENT AND PLAN / ED COURSE  Pertinent labs & imaging results that were available during my care of the patient were reviewed by me and considered in my medical decision making (see chart for details).  Patient is a 49 year old black male with a history of HIV hepatitis C. Who had chest pain that started greater than 16 hours ago.   He has no past medical history of cardiac disease.  He has no risk factors for cardiac disease except for a former history of smoking.   He is chest pain is atypical . He will receive nonsteroidal anti-inflammatory pain medicine in the emergency department. Evaluation in the emergency department including chest x-ray labs including troponin and EKG are without any abnormalities.  He remained stable in the emergency department and is stable for follow-up outpatient with his primary care physician. ____________________________________________   FINAL CLINICAL IMPRESSION(S) / ED DIAGNOSES  Final diagnoses:  None  1. Chest pain 2. Anxiety 3. Smoking   Sherlyn Hay, DO 04/17/15 1804

## 2015-04-17 NOTE — ED Notes (Signed)
Patient out to nurses desk to request hospital note stating patient was in ED, patient wanting to leave. Informed patient MD will be in room shortly to assess patient. Patient verbalized understanding and agreed to stay and be treated. MD made aware.

## 2015-04-17 NOTE — ED Notes (Signed)
Dr. Mindi Junker at bedside to assess patient.

## 2015-11-08 ENCOUNTER — Emergency Department
Admission: EM | Admit: 2015-11-08 | Discharge: 2015-11-08 | Disposition: A | Discharge status: Home / Self Care | Payer: Medicaid Other | Attending: Emergency Medicine | Admitting: Emergency Medicine

## 2015-11-08 ENCOUNTER — Encounter: Payer: Self-pay | Admitting: *Deleted

## 2015-11-08 DIAGNOSIS — R441 Visual hallucinations: Secondary | ICD-10-CM | POA: Insufficient documentation

## 2015-11-08 DIAGNOSIS — I10 Essential (primary) hypertension: Secondary | ICD-10-CM | POA: Insufficient documentation

## 2015-11-08 DIAGNOSIS — Z008 Encounter for other general examination: Secondary | ICD-10-CM | POA: Diagnosis present

## 2015-11-08 DIAGNOSIS — F17210 Nicotine dependence, cigarettes, uncomplicated: Secondary | ICD-10-CM | POA: Insufficient documentation

## 2015-11-08 DIAGNOSIS — F1410 Cocaine abuse, uncomplicated: Secondary | ICD-10-CM | POA: Diagnosis not present

## 2015-11-08 LAB — COMPREHENSIVE METABOLIC PANEL
ALT: 45 U/L (ref 17–63)
ANION GAP: 5 (ref 5–15)
AST: 32 U/L (ref 15–41)
Albumin: 3.7 g/dL (ref 3.5–5.0)
Alkaline Phosphatase: 82 U/L (ref 38–126)
BUN: 21 mg/dL — ABNORMAL HIGH (ref 6–20)
CHLORIDE: 104 mmol/L (ref 101–111)
CO2: 31 mmol/L (ref 22–32)
Calcium: 8.6 mg/dL — ABNORMAL LOW (ref 8.9–10.3)
Creatinine, Ser: 1.42 mg/dL — ABNORMAL HIGH (ref 0.61–1.24)
GFR calc non Af Amer: 56 mL/min — ABNORMAL LOW (ref >60)
GLUCOSE: 88 mg/dL (ref 65–99)
POTASSIUM: 3.9 mmol/L (ref 3.5–5.1)
SODIUM: 140 mmol/L (ref 135–145)
Total Bilirubin: 0.4 mg/dL (ref 0.3–1.2)
Total Protein: 7.5 g/dL (ref 6.5–8.1)

## 2015-11-08 LAB — URINE DRUG SCREEN, QUALITATIVE (ARMC ONLY)
AMPHETAMINES, UR SCREEN: NOT DETECTED
Barbiturates, Ur Screen: NOT DETECTED
Benzodiazepine, Ur Scrn: NOT DETECTED
COCAINE METABOLITE, UR ~~LOC~~: POSITIVE — AB
Cannabinoid 50 Ng, Ur ~~LOC~~: NOT DETECTED
MDMA (ECSTASY) UR SCREEN: NOT DETECTED
Methadone Scn, Ur: NOT DETECTED
OPIATE, UR SCREEN: NOT DETECTED
PHENCYCLIDINE (PCP) UR S: NOT DETECTED
Tricyclic, Ur Screen: NOT DETECTED

## 2015-11-08 LAB — CBC
HEMATOCRIT: 44.3 % (ref 40.0–52.0)
HEMOGLOBIN: 14.9 g/dL (ref 13.0–18.0)
MCH: 33.6 pg (ref 26.0–34.0)
MCHC: 33.7 g/dL (ref 32.0–36.0)
MCV: 99.6 fL (ref 80.0–100.0)
PLATELETS: 191 10*3/uL (ref 150–440)
RBC: 4.44 MIL/uL (ref 4.40–5.90)
RDW: 13.1 % (ref 11.5–14.5)
WBC: 4.5 10*3/uL (ref 3.8–10.6)

## 2015-11-08 LAB — ETHANOL: Alcohol, Ethyl (B): 12 mg/dL — ABNORMAL HIGH (ref <5)

## 2015-11-08 LAB — TROPONIN I: Troponin I: <0.03 ng/mL (ref <0.031)

## 2015-11-08 NOTE — ED Notes (Signed)
Spoke with [facility], Tammy Sours, and informed them pt is on his way to their facility. Tammy Sours verbalized understanding.

## 2015-11-08 NOTE — ED Notes (Signed)
Called lab to add on Troponin 

## 2015-11-08 NOTE — ED Notes (Signed)

## 2015-11-08 NOTE — ED Provider Notes (Signed)
CSN: [id number]     Arrival date & time 11/08/15  1647 History   First MD Initiated Contact with Patient 11/08/15 1728     Chief Complaint  Patient presents with  . Medical Clearance     (Consider location/radiation/quality/duration/timing/severity/associated sxs/prior Treatment) The history is provided by the patient.  Roy Liter Kerper Sr. is a 49 y.o. male hx of HIV (CD4 nl, viral load undetectable a week ago at [facility] per patient), here wanting detox. Patient states that he wants detox from alcohol and cocaine. Patient uses cocaine daily and last use was a day ago. Patient also drinks alcohol and last alcohol use was 2 days ago. Called [facility] in [place] wants him to come here for medical clearance. Denies suicidal or homicidal ideations. Has occasional visual hallucinations but no auditory hallucinations.    Past Medical History  Diagnosis Date  . HTN (hypertension)   . HIV (human immunodeficiency virus infection) (HCC)   . Arthritis   . Hepatitis C    Past Surgical History  Procedure Laterality Date  . Hernia repair    . Toe surgery     No family history on file. Social History  Substance Use Topics  . Smoking status: Current Every Day Smoker    Types: Cigarettes  . Smokeless tobacco: None  . Alcohol Use: No    Review of Systems  Psychiatric/Behavioral: The patient is not nervous/anxious.   All other systems reviewed and are negative.     Allergies  Review of patient's allergies indicates no known allergies.  Home Medications   Prior to Admission medications   Not on File   BP 131/86 mmHg  Pulse 49  Temp(Src) 98.6 F (37 C) (Oral)  Resp 20  Ht 5\' 9"  (1.753 m)  Wt 185 lb (83.915 kg)  BMI 27.31 kg/m2  SpO2 98% Physical Exam  Constitutional: He is oriented to person, place, and time. He appears well-developed and well-nourished.  HENT:  Head: Normocephalic.  Mouth/Throat: Oropharynx is clear and moist.  Eyes: Conjunctivae are normal. Pupils  are equal, round, and reactive to light.  Neck: Normal range of motion.  Cardiovascular: Normal rate, regular rhythm and normal heart sounds.   Pulmonary/Chest: Effort normal and breath sounds normal. No respiratory distress. He has no wheezes. He has no rales.  Abdominal: Soft. Bowel sounds are normal. He exhibits no distension. There is no tenderness. There is no rebound.  Musculoskeletal: Normal range of motion. He exhibits no edema or tenderness.  Neurological: He is alert and oriented to person, place, and time. No cranial nerve deficit. Coordination normal.  Skin: Skin is warm and dry.  Psychiatric: He has a normal mood and affect. His behavior is normal. Judgment and thought content normal.  Nursing note and vitals reviewed.   ED Course  Procedures (including critical care time) Labs Review Labs Reviewed  COMPREHENSIVE METABOLIC PANEL - Abnormal; Notable for the following:    BUN 21 (*)    Creatinine, Ser 1.42 (*)    Calcium 8.6 (*)    GFR calc non Af Amer 56 (*)    All other components within normal limits  ETHANOL - Abnormal; Notable for the following:    Alcohol, Ethyl (B) 12 (*)    All other components within normal limits  URINE DRUG SCREEN, QUALITATIVE ([facility] ONLY) - Abnormal; Notable for the following:    Cocaine Metabolite,Ur [place] POSITIVE (*)    All other components within normal limits  CBC  TROPONIN I  Imaging Review No results found. I have personally reviewed and evaluated these images and lab results as part of my medical decision-making.   EKG Interpretation None       ED ECG REPORT I, YAO, DAVID, the attending physician, personally viewed and interpreted this ECG.   Date: 11/08/2015  EKG Time: 18:50  Rate: 53  Rhythm: sinus bradycardia  Axis: normal  Intervals:none  ST&T Change: nonspecific   MDM   Final diagnoses:  None   Roy Register Sr. is a 49 y.o. male here with medical clearance. No suicidal or homicidal. Has occasional  visual hallucinations that is chronic and no auditory hallucinations. Will check CBC given hx of HIV. If labs unremarkable, will be medically cleared. Will not need psych eval.   6:50 pm Patient had some chest pain just now. EKG unremarkable. Will add on trop. Turned out that he had intermittent chest pain for several weeks so trop x 1 sufficient.   8:00 PM Trop neg x 1. UDS + cocaine. Nurse called [facility]. Patient can be discharged and will go there for detox.      Richardean Canal, MD 11/08/15 2005

## 2015-11-08 NOTE — ED Notes (Signed)
Pt here for med clearance to [facility] in [place]. Denies SI,HI. A/O. Not dressed out at this time. Blood and urine sent to lab per order.

## 2015-11-08 NOTE — ED Notes (Signed)
Drug abuse, wants to go to [facility] in [place]

## 2015-11-08 NOTE — ED Notes (Signed)
Per Dr. Silverio Lay, pt is medically cleared at this time. [facility], [phone], was called to confirm bed for pt.Marland Kitchen Spoke with [place]. Bed confirmed for pt. Called [facility] per pt statement, spoke with Kathlene November. Kathlene November stated he will be at [facility] to pick pt up in an hour to take to [facility]. Pt verbalized understanding of plan. Pt was provided meal tray and drink.

## 2015-11-08 NOTE — Discharge Instructions (Signed)
Go to [facility] for detox.   Avoid taking drugs or drinking alcohol.  See your doctor.   Return to ER if you have thoughts of harming yourself or others, hallucinations.

## 2015-11-08 NOTE — ED Notes (Signed)
Pt c/o 6/10 chest pain, mid sternal. States that he has been having cp on and off about 2 weeks and takes aspirin when he has pain. Ekg, vs done. Dr Silverio Lay notified.

## 2015-12-04 ENCOUNTER — Encounter: Payer: Self-pay | Admitting: *Deleted

## 2015-12-04 ENCOUNTER — Emergency Department
Admission: EM | Admit: 2015-12-04 | Discharge: 2015-12-04 | Disposition: A | Discharge status: Home / Self Care | Payer: Medicaid Other | Attending: Emergency Medicine | Admitting: Emergency Medicine

## 2015-12-04 ENCOUNTER — Emergency Department: Payer: Medicaid Other

## 2015-12-04 DIAGNOSIS — R05 Cough: Secondary | ICD-10-CM | POA: Diagnosis not present

## 2015-12-04 DIAGNOSIS — F17210 Nicotine dependence, cigarettes, uncomplicated: Secondary | ICD-10-CM | POA: Insufficient documentation

## 2015-12-04 DIAGNOSIS — R079 Chest pain, unspecified: Secondary | ICD-10-CM

## 2015-12-04 DIAGNOSIS — I10 Essential (primary) hypertension: Secondary | ICD-10-CM | POA: Diagnosis not present

## 2015-12-04 HISTORY — DX: Unspecified asthma, uncomplicated: J45.909

## 2015-12-04 LAB — CBC WITH DIFFERENTIAL/PLATELET
BASOS PCT: 1 %
Basophils Absolute: 0.1 10*3/uL (ref 0–0.1)
Eosinophils Absolute: 0.2 10*3/uL (ref 0–0.7)
Eosinophils Relative: 4 %
HEMATOCRIT: 41.5 % (ref 40.0–52.0)
HEMOGLOBIN: 13.9 g/dL (ref 13.0–18.0)
LYMPHS ABS: 2.7 10*3/uL (ref 1.0–3.6)
LYMPHS PCT: 60 %
MCH: 32.8 pg (ref 26.0–34.0)
MCHC: 33.5 g/dL (ref 32.0–36.0)
MCV: 98.2 fL (ref 80.0–100.0)
MONO ABS: 0.4 10*3/uL (ref 0.2–1.0)
MONOS PCT: 8 %
NEUTROS ABS: 1.2 10*3/uL — AB (ref 1.4–6.5)
NEUTROS PCT: 27 %
Platelets: 174 10*3/uL (ref 150–440)
RBC: 4.23 MIL/uL — ABNORMAL LOW (ref 4.40–5.90)
RDW: 12.5 % (ref 11.5–14.5)
WBC: 4.5 10*3/uL (ref 3.8–10.6)

## 2015-12-04 LAB — TROPONIN I
Troponin I: <0.03 ng/mL (ref <0.031)
Troponin I: <0.03 ng/mL (ref <0.031)

## 2015-12-04 LAB — BASIC METABOLIC PANEL
ANION GAP: 8 (ref 5–15)
BUN: 18 mg/dL (ref 6–20)
CALCIUM: 8.8 mg/dL — AB (ref 8.9–10.3)
CHLORIDE: 109 mmol/L (ref 101–111)
CO2: 24 mmol/L (ref 22–32)
Creatinine, Ser: 1.24 mg/dL (ref 0.61–1.24)
GFR calc non Af Amer: >60 mL/min (ref >60)
Glucose, Bld: 153 mg/dL — ABNORMAL HIGH (ref 65–99)
Potassium: 4 mmol/L (ref 3.5–5.1)
SODIUM: 141 mmol/L (ref 135–145)

## 2015-12-04 LAB — CBC
HCT: 42.6 % (ref 40.0–52.0)
HEMOGLOBIN: 14.3 g/dL (ref 13.0–18.0)
MCH: 32.8 pg (ref 26.0–34.0)
MCHC: 33.5 g/dL (ref 32.0–36.0)
MCV: 98 fL (ref 80.0–100.0)
Platelets: 185 10*3/uL (ref 150–440)
RBC: 4.35 MIL/uL — AB (ref 4.40–5.90)
RDW: 12.5 % (ref 11.5–14.5)
WBC: 3.7 10*3/uL — AB (ref 3.8–10.6)

## 2015-12-04 MED ORDER — IOHEXOL 350 MG/ML SOLN
75.0000 mL | Freq: Once | INTRAVENOUS | Status: AC | PRN
  Administered 2015-12-04: 75 mL via INTRAVENOUS

## 2015-12-04 NOTE — ED Provider Notes (Signed)
[facility] Emergency Department Provider Note  ____________________________________________  Time seen: Approximately 10:37 PM  I have reviewed the triage vital signs and the nursing notes.   HISTORY  Chief Complaint Chest Pain    HPI Roy Googe Sr. is a 49 y.o. male who per his last note has HIV with an undetectable viral load. Patient reports he is taking his medicines as directed. He has been having some sharp stabbing chest pain in the left chest comes last a second goes away comes last or second goes away repeats like this for approximately half an hour time. Nothing really seems to make it better or worse and worse and really exercising or deep breathing. Several times in the past and was admitted for twice in the past but he says nothing was found. Patient is not short of breath with it is not nauseated with it . Patient reports his heart rate is usually in the mid low 40s. Patient denies any fever but says he is coughing up lots of thick slimy black material.  Past Medical History  Diagnosis Date  . HTN (hypertension)   . HIV (human immunodeficiency virus infection) (HCC)   . Arthritis   . Hepatitis C   . Asthma     There are no active problems to display for this patient.   Past Surgical History  Procedure Laterality Date  . Hernia repair    . Toe surgery      No current outpatient prescriptions on file.  Allergies Review of patient's allergies indicates no known allergies.  No family history on file.  Social History Social History  Substance Use Topics  . Smoking status: Current Every Day Smoker    Types: Cigarettes  . Smokeless tobacco: None  . Alcohol Use: No    Review of Systems Constitutional: No fever/chills Eyes: No visual changes. ENT: No sore throat. Cardiovascular:  chest pain. Respiratory: Denies shortness of breath. Gastrointestinal: No abdominal pain.  No nausea, no vomiting.  No diarrhea.  No  constipation. Genitourinary: Negative for dysuria. Musculoskeletal: Negative for back pain. Skin: Negative for rash. Neurological: Negative for headaches, focal weakness or numbness.  10-point ROS otherwise negative.  ____________________________________________   PHYSICAL EXAM:  VITAL SIGNS: ED Triage Vitals  Enc Vitals Group     BP 12/04/15 1548 130/65 mmHg     Pulse Rate 12/04/15 1548 50     Resp 12/04/15 1548 16     Temp 12/04/15 1548 99.1 F (37.3 C)     Temp Source 12/04/15 1548 Oral     SpO2 12/04/15 1548 97 %     Weight 12/04/15 1548 196 lb (88.905 kg)     Height 12/04/15 1548 5\' 9"  (1.753 m)     Head Cir --      Peak Flow --      Pain Score 12/04/15 1550 6     Pain Loc --      Pain Edu? --      Excl. in GC? --     Constitutional: Alert and oriented. Well appearing and in no acute distress. Eyes: Conjunctivae are normal. PERRL. EOMI. Head: Atraumatic. Nose: No congestion/rhinnorhea. Mouth/Throat: Mucous membranes are moist.  Oropharynx non-erythematous. Neck: No stridor. Cardiovascular: Normal rate, regular rhythm. Grossly normal heart sounds.  Good peripheral circulation. Respiratory: Normal respiratory effort.  No retractions. Lungs CTAB. Gastrointestinal: Soft and nontender. No distention. No abdominal bruits. No CVA tenderness. Musculoskeletal: No lower extremity tenderness nor edema.  No joint effusions. Neurologic:  Normal speech and language. No gross focal neurologic deficits are appreciated. No gait instability. Skin:  Skin is warm, dry and intact. No rash noted. Psychiatric: Mood and affect are normal. Speech and behavior are normal.  ____________________________________________   LABS (all labs ordered are listed, but only abnormal results are displayed)  Labs Reviewed  BASIC METABOLIC PANEL - Abnormal; Notable for the following:    Glucose, Bld 153 (*)    Calcium 8.8 (*)    All other components within normal limits  CBC - Abnormal; Notable  for the following:    WBC 3.7 (*)    RBC 4.35 (*)    All other components within normal limits  CBC WITH DIFFERENTIAL/PLATELET - Abnormal; Notable for the following:    RBC 4.23 (*)    Neutro Abs 1.2 (*)    All other components within normal limits  TROPONIN I  TROPONIN I   ____________________________________________  EKG  EKG read and interpreted by me shows sinus bradycardia at rate of 46 normal axis there are some nonspecific ST-T wave changes ____________________________________________  RADIOLOGY  Chest x-ray read by radiology as no acute disease CT read by radiology as no acute disease ____________________________________________   PROCEDURES    ____________________________________________   INITIAL IMPRESSION / ASSESSMENT AND PLAN / ED COURSE  Pertinent labs & imaging results that were available during my care of the patient were reviewed by me and considered in my medical decision making (see chart for details).   ____________________________________________   FINAL CLINICAL IMPRESSION(S) / ED DIAGNOSES  Final diagnoses:  Chest pain, unspecified chest pain type      Arnaldo Natal, MD 12/04/15 2332

## 2015-12-04 NOTE — ED Notes (Signed)
Patient c/o left-side chest pain that has been intermittent for over 6 months. Patient is being followed at [facility] at the ID clinic due to swollen lymph nodes in the jaw, night sweats, pain with swallowing. Patient states the chest pain became worse this afternoon after church. Patient states he has a productive cough and is coughing up "black balls." Patient states he had a recent chest cold.

## 2015-12-06 ENCOUNTER — Other Ambulatory Visit: Payer: Self-pay | Admitting: Internal Medicine

## 2015-12-06 DIAGNOSIS — R011 Cardiac murmur, unspecified: Secondary | ICD-10-CM

## 2015-12-06 DIAGNOSIS — R0602 Shortness of breath: Secondary | ICD-10-CM

## 2015-12-06 DIAGNOSIS — I208 Other forms of angina pectoris: Secondary | ICD-10-CM

## 2015-12-07 ENCOUNTER — Other Ambulatory Visit: Payer: Self-pay | Admitting: Internal Medicine

## 2015-12-07 DIAGNOSIS — R011 Cardiac murmur, unspecified: Secondary | ICD-10-CM

## 2015-12-07 DIAGNOSIS — I208 Other forms of angina pectoris: Secondary | ICD-10-CM

## 2015-12-07 DIAGNOSIS — R0602 Shortness of breath: Secondary | ICD-10-CM

## 2015-12-15 ENCOUNTER — Ambulatory Visit
Admission: RE | Admit: 2015-12-15 | Discharge: 2015-12-15 | Disposition: A | Discharge status: Home / Self Care | Payer: Medicaid Other | Source: Ambulatory Visit | Attending: Internal Medicine | Admitting: Internal Medicine

## 2015-12-15 DIAGNOSIS — R011 Cardiac murmur, unspecified: Secondary | ICD-10-CM | POA: Diagnosis not present

## 2015-12-15 DIAGNOSIS — I208 Other forms of angina pectoris: Secondary | ICD-10-CM

## 2015-12-15 DIAGNOSIS — R0602 Shortness of breath: Secondary | ICD-10-CM | POA: Insufficient documentation

## 2015-12-15 MED ORDER — TECHNETIUM TC 99M SESTAMIBI - CARDIOLITE
13.9900 | Freq: Once | INTRAVENOUS | Status: AC | PRN
  Administered 2015-12-15: 09:00:00 13.99 via INTRAVENOUS

## 2015-12-15 MED ORDER — TECHNETIUM TC 99M SESTAMIBI - CARDIOLITE
30.7600 | Freq: Once | INTRAVENOUS | Status: AC | PRN
  Administered 2015-12-15: 30.76 via INTRAVENOUS

## 2015-12-15 NOTE — Progress Notes (Signed)
*  PRELIMINARY RESULTS* Echocardiogram 2D Echocardiogram has been performed.  Roy [name] 12/15/2015, 11:17 AM

## 2015-12-20 LAB — NM MYOCAR MULTI W/SPECT W/WALL MOTION / EF
CHL CUP RESTING HR STRESS: 43 {beats}/min
CSEPED: 10 min
CSEPHR: 72 %
CSEPPHR: 122 {beats}/min
Estimated workload: 11.7 METS
Exercise duration (sec): 0 s
LV dias vol: 155 mL
LVSYSVOL: 63 mL
MPHR: 169 {beats}/min
SDS: 0
SRS: 0
SSS: 0
TID: 1

## 2016-07-16 DIAGNOSIS — M2021 Hallux rigidus, right foot: Secondary | ICD-10-CM | POA: Insufficient documentation

## 2016-10-24 ENCOUNTER — Encounter: Payer: Self-pay | Admitting: Emergency Medicine

## 2016-10-24 ENCOUNTER — Emergency Department
Admission: EM | Admit: 2016-10-24 | Discharge: 2016-10-25 | Disposition: A | Discharge status: Other Acute Inpatient Hospital | Payer: Medicaid Other | Attending: Emergency Medicine | Admitting: Emergency Medicine

## 2016-10-24 DIAGNOSIS — M79671 Pain in right foot: Secondary | ICD-10-CM | POA: Insufficient documentation

## 2016-10-24 DIAGNOSIS — J45909 Unspecified asthma, uncomplicated: Secondary | ICD-10-CM | POA: Diagnosis not present

## 2016-10-24 DIAGNOSIS — I10 Essential (primary) hypertension: Secondary | ICD-10-CM | POA: Insufficient documentation

## 2016-10-24 DIAGNOSIS — R45851 Suicidal ideations: Secondary | ICD-10-CM

## 2016-10-24 DIAGNOSIS — F4325 Adjustment disorder with mixed disturbance of emotions and conduct: Secondary | ICD-10-CM | POA: Diagnosis not present

## 2016-10-24 DIAGNOSIS — F17210 Nicotine dependence, cigarettes, uncomplicated: Secondary | ICD-10-CM | POA: Diagnosis not present

## 2016-10-24 DIAGNOSIS — Z21 Asymptomatic human immunodeficiency virus [HIV] infection status: Secondary | ICD-10-CM | POA: Diagnosis not present

## 2016-10-24 DIAGNOSIS — Z79899 Other long term (current) drug therapy: Secondary | ICD-10-CM | POA: Insufficient documentation

## 2016-10-24 LAB — COMPREHENSIVE METABOLIC PANEL
ALT: 33 U/L (ref 17–63)
AST: 29 U/L (ref 15–41)
Albumin: 3.6 g/dL (ref 3.5–5.0)
Alkaline Phosphatase: 60 U/L (ref 38–126)
Anion gap: 5 (ref 5–15)
BUN: 16 mg/dL (ref 6–20)
CO2: 27 mmol/L (ref 22–32)
Calcium: 8.8 mg/dL — ABNORMAL LOW (ref 8.9–10.3)
Chloride: 107 mmol/L (ref 101–111)
Creatinine, Ser: 1.23 mg/dL (ref 0.61–1.24)
GFR calc Af Amer: >60 mL/min (ref >60)
GFR calc non Af Amer: >60 mL/min (ref >60)
Glucose, Bld: 88 mg/dL (ref 65–99)
Potassium: 4.4 mmol/L (ref 3.5–5.1)
Sodium: 139 mmol/L (ref 135–145)
Total Bilirubin: 0.2 mg/dL — ABNORMAL LOW (ref 0.3–1.2)
Total Protein: 7.4 g/dL (ref 6.5–8.1)

## 2016-10-24 LAB — CBC
HCT: 41.9 % (ref 40.0–52.0)
Hemoglobin: 14.1 g/dL (ref 13.0–18.0)
MCH: 33.9 pg (ref 26.0–34.0)
MCHC: 33.6 g/dL (ref 32.0–36.0)
MCV: 100.8 fL — ABNORMAL HIGH (ref 80.0–100.0)
Platelets: 213 10*3/uL (ref 150–440)
RBC: 4.16 MIL/uL — ABNORMAL LOW (ref 4.40–5.90)
RDW: 13.1 % (ref 11.5–14.5)
WBC: 4 10*3/uL (ref 3.8–10.6)

## 2016-10-24 LAB — ETHANOL

## 2016-10-24 LAB — ACETAMINOPHEN LEVEL

## 2016-10-24 LAB — SALICYLATE LEVEL: Salicylate Lvl: <7 mg/dL (ref 2.8–30.0)

## 2016-10-24 NOTE — ED Notes (Addendum)
Pt ambulatory to the BR with no assistance  Pt pushing the walker but ambulates with a steady gait

## 2016-10-24 NOTE — ED Notes (Addendum)
+  hepC +HIV +Homeless

## 2016-10-24 NOTE — ED Notes (Signed)
Pt given ginger ale with ice and two packs of graham crackers at this time. No other needs voiced at this time.

## 2016-10-24 NOTE — ED Notes (Signed)
Collected CBC, R forearm. Sent to lab.

## 2016-10-24 NOTE — ED Provider Notes (Signed)
[facility] Emergency Department Provider Note   ____________________________________________    I have reviewed the triage vital signs and the nursing notes.   HISTORY  Chief Complaint Suicidal     HPI Roy RegisterDanny Kay Hattabaugh Sr. is a 50 y.o. male who presents with complaints of suicidal ideation. Patient reports he broke up with his girlfriend and is thinking of "ending it all" by taking a "bunch of pills." Reportedly he has attempted suicide before. Patient has a history of HIV, he reports compliance with his medications. He denies physical complaints this time.   Past Medical History:  Diagnosis Date  . Arthritis   . Asthma   . Hepatitis C   . HIV (human immunodeficiency virus infection) (HCC)   . HTN (hypertension)     Patient Active Problem List   Diagnosis Date Noted  . 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 HERNIA REPAIR    . TOE SURGERY      Prior to Admission medications   Medication Sig Start Date End Date Taking? Authorizing Provider  acetaminophen (TYLENOL) 500 MG tablet Take 500 mg by mouth every 8 (eight) hours as needed.   Yes Historical Provider, MD  aspirin EC 81 MG tablet Take 81 mg by mouth daily.   Yes Historical Provider, MD  docusate sodium (COLACE) 100 MG capsule Take by mouth 2 (two) times daily.   Yes Historical Provider, MD  ergocalciferol (VITAMIN D2) 50000 units capsule Take 50,000 Units by mouth once a week.   Yes Historical Provider, MD  gabapentin (NEURONTIN) 100 MG capsule Take 100 mg by mouth 3 (three) times daily.   Yes Historical Provider, MD  oxycodone (OXY-IR) 5 MG capsule Take 5 mg by mouth every 4 (four) hours as needed.   Yes Historical Provider, MD  promethazine (PHENERGAN) 12.5 MG tablet Take 12.5 mg by mouth every 6 (six) hours as needed for nausea or vomiting.   Yes Historical Provider, MD      Allergies Patient has no known allergies.  No family history on file.  Social History Social History  Substance Use Topics  . Smoking status: Current Every Day Smoker    Types: Cigarettes  . Smokeless tobacco: Never Used  . Alcohol use No    Review of Systems  Constitutional: No fever/chills   Cardiovascular: Denies chest pain. Respiratory: Denies shortness of breath. Gastrointestinal: No abdominal pain.  No nausea, no vomiting.   Genitourinary: Negative for dysuria. Musculoskeletal: Right foot pain, post surgical Skin: Negative for rash. Neurological: Negative for headaches or weakness  10-point ROS otherwise negative.  ____________________________________________   PHYSICAL EXAM:  VITAL SIGNS: ED Triage Vitals  Enc Vitals Group     BP 10/24/16 1522 122/68     Pulse Rate 10/24/16 1522 (!) 46     Resp 10/24/16 1522 20     Temp 10/24/16 1522 98.9 F (37.2 C)     Temp Source 10/24/16 1522 Oral     SpO2 10/24/16 1522 97 %     Weight 10/24/16 1522 185 lb (83.9 kg)     Height 10/24/16 1522 5\' 9"  (1.753 m)     Head Circumference --      Peak Flow --      Pain Score 10/24/16 1528 8     Pain Loc --      Pain Edu? --      Excl. in GC? --  Constitutional: Alert and oriented. No acute distress. Eyes: Conjunctivae are normal.   Nose: No congestion/rhinnorhea. Mouth/Throat: Mucous membranes are moist.    Cardiovascular: Normal rate, regular rhythm. Grossly normal heart sounds.  Good peripheral circulation. Respiratory: Normal respiratory effort.  No retractions. Lungs CTAB. Gastrointestinal: Soft and nontender. No distention.  No CVA tenderness. Genitourinary: deferred Musculoskeletal: Right foot, healing incision, clean dry and intact, warm and well perfused Neurologic:  Normal speech and language. No gross focal neurologic deficits are appreciated.  Skin:  Skin is warm, dry Psychiatric: Mood and affect are normal. Speech and behavior are  normal.  ____________________________________________   LABS (all labs ordered are listed, but only abnormal results are displayed)  Labs Reviewed  COMPREHENSIVE METABOLIC PANEL - Abnormal; Notable for the following:       Result Value   Calcium 8.8 (*)    Total Bilirubin 0.2 (*)    All other components within normal limits  ACETAMINOPHEN LEVEL - Abnormal; Notable for the following:    Acetaminophen (Tylenol), Serum <10 (*)    All other components within normal limits  CBC - Abnormal; Notable for the following:    RBC 4.16 (*)    MCV 100.8 (*)    All other components within normal limits  ETHANOL  SALICYLATE LEVEL  URINE DRUG SCREEN, QUALITATIVE ([facility] ONLY)   ____________________________________________  EKG  None ____________________________________________  RADIOLOGY  None ____________________________________________   PROCEDURES  Procedure(s) performed: No    Critical Care performed: No ____________________________________________   INITIAL IMPRESSION / ASSESSMENT AND PLAN / ED COURSE  Pertinent labs & imaging results that were available during my care of the patient were reviewed by me and considered in my medical decision making (see chart for details).  Patient overall well-appearing and in no distress. He is actually joking with staff however given his history of depression and suicidal attempts and he acknowledges suicidal ideation currently I will place him under involuntary commitment and consult TTS in psychiatry.  Clinical Course    ____________________________________________   FINAL CLINICAL IMPRESSION(S) / ED DIAGNOSES  Final diagnoses:  Suicidal ideation      NEW MEDICATIONS STARTED DURING THIS VISIT:  New Prescriptions   No medications on file     Note:  This document was prepared using Dragon voice recognition software and may include unintentional dictation errors.    Jene Every, MD 10/24/16 [phone]

## 2016-10-24 NOTE — ED Triage Notes (Signed)
Pt presents with SI, brought in by case worker. Pt states him and his girlfriend broke up recently. Pt states that he would take a lot of pills to end his life. Pt with hx of same.

## 2016-10-24 NOTE — Consult Note (Signed)
[facility] Consult   Reason for Consult:  Consult for 50 year old man with a history of multiple medical problems who came to the emergency room voicing suicidal ideation Referring Physician:  Corky Downs Patient Identification: Roy Payment Hassing Sr. MRN:  [id number] Principal Diagnosis: Adjustment disorder with mixed disturbance of emotions and conduct Diagnosis:   Patient Active Problem List   Diagnosis Date Noted  . Adjustment disorder with mixed disturbance of emotions and conduct [F43.25] 10/24/2016    Total Time spent with patient: 1 hour  Subjective:   Roy Guild Sr. is a 50 y.o. male patient admitted with "I've just been feeling suicidal".  HPI:  Patient interviewed. Chart reviewed including his notes from other hospitals most specifically including the note from the [facility] emergency room yesterday. This is a 50 year old man who comes into the emergency room saying he is having suicidal thoughts. He tells me that he's been having suicidal thoughts for the last few days. He feels like everything is hopeless and known will ever help him and so he should just give up. He says he's been thinking about "taking a bunch of pills". He can't really specify why it is that he hasn't followed up on it and done anything yet. He says he's been depressed for about a month and he attributes it to the death of his mother. [name] died a couple months ago and since then he has not had her to fall back on. He's had trouble having a stable place to live. He expresses resentment against his siblings for not helping him more. Right now he appears to be homeless. Was at the [facility] emergency room yesterday with an identical set of complaints but recanted the suicidality when they said they could get him into a shelter. There was no bed available at the [facility] today and so his case manager dropped him at our emergency room. Patient denies being on any medicine for depression  although it's clear from the note yesterday that he is supposed to be on citalopram. He doesn't seem to have a very clear idea of any of his medical care. He denies that he's been drinking or using any drugs.  Social history: Mother is deceased. Patient is disabled. He complains about how he can't get full benefits because of his history of convictions in the past. Has several siblings but sounds like he doesn't have very good relationships with them.  Medical history: HIV-positive. Hepatitis C positive. Patient claims that he is compliant with all of his medications. He has a bag of medicine with him however and none of it is his HIV medicine.  Substance abuse history: Says that he hasn't used any alcohol or cocaine in about 3 or 4 months.  Past Psychiatric History: Patient has had prior psychiatric admissions he says many years ago at our facility probably 30 or 14 years ago. He has been followed for his mental health treatment largely through the HIV clinic. He is supposed to be on citalopram currently. He claims that he is supposed to be on Abilify but I don't see any mention of that recently. He says there was a time in the past when he thought seriously about killing himself by an overdose. Not clear that he ever really followed through on it. No history of mania.  Risk to Self: Is patient at risk for suicide?: Yes Risk to Others:   Prior Inpatient Therapy:   Prior Outpatient Therapy:    Past Medical History:  Past  Medical History:  Diagnosis Date  . Arthritis   . Asthma   . Hepatitis C   . HIV (human immunodeficiency virus infection) ([place])   . HTN (hypertension)     Past Surgical History:  Procedure Laterality Date  . HERNIA REPAIR    . TOE SURGERY     Family History: No family history on file. Family Psychiatric  History: No known family history Social History:  History  Alcohol Use No     History  Drug use: Unknown    Social History   Social History  . Marital  status: Single    Spouse name: N/A  . Number of children: N/A  . Years of education: N/A   Social History Main Topics  . Smoking status: Current Every Day Smoker    Types: Cigarettes  . Smokeless tobacco: Never Used  . Alcohol use No  . Drug use: Unknown  . Sexual activity: Yes   Other Topics Concern  . None   Social History Narrative  . None   Additional Social History:    Allergies:  No Known Allergies  Labs:  Results for orders placed or performed during the hospital encounter of 10/24/16 (from the past 48 hour(s))  Comprehensive metabolic panel     Status: Abnormal   Collection Time: 10/24/16  3:30 PM  Result Value Ref Range   Sodium 139 135 - 145 mmol/L   Potassium 4.4 3.5 - 5.1 mmol/L   Chloride 107 101 - 111 mmol/L   CO2 27 22 - 32 mmol/L   Glucose, Bld 88 65 - 99 mg/dL   BUN 16 6 - 20 mg/dL   Creatinine, Ser 1.23 0.61 - 1.24 mg/dL   Calcium 8.8 (L) 8.9 - 10.3 mg/dL   Total Protein 7.4 6.5 - 8.1 g/dL   Albumin 3.6 3.5 - 5.0 g/dL   AST 29 15 - 41 U/L   ALT 33 17 - 63 U/L   Alkaline Phosphatase 60 38 - 126 U/L   Total Bilirubin 0.2 (L) 0.3 - 1.2 mg/dL   GFR calc non Af Amer >60 >60 mL/min   GFR calc Af Amer >60 >60 mL/min    Comment: (NOTE) The eGFR has been calculated using the CKD EPI equation. This calculation has not been validated in all clinical situations. eGFR's persistently <60 mL/min signify possible Chronic Kidney Disease.    Anion gap 5 5 - 15  Ethanol     Status: None   Collection Time: 10/24/16  3:30 PM  Result Value Ref Range   Alcohol, Ethyl (B) <5 <5 mg/dL    Comment:        LOWEST DETECTABLE LIMIT FOR SERUM ALCOHOL IS 5 mg/dL FOR MEDICAL PURPOSES ONLY   Salicylate level     Status: None   Collection Time: 10/24/16  3:30 PM  Result Value Ref Range   Salicylate Lvl <5.0 2.8 - 30.0 mg/dL  Acetaminophen level     Status: Abnormal   Collection Time: 10/24/16  3:30 PM  Result Value Ref Range   Acetaminophen (Tylenol), Serum <10 (L)  10 - 30 ug/mL    Comment:        THERAPEUTIC CONCENTRATIONS VARY SIGNIFICANTLY. A RANGE OF 10-30 ug/mL MAY BE AN EFFECTIVE CONCENTRATION FOR MANY PATIENTS. HOWEVER, SOME ARE BEST TREATED AT CONCENTRATIONS OUTSIDE THIS RANGE. ACETAMINOPHEN CONCENTRATIONS >150 ug/mL AT 4 HOURS AFTER INGESTION AND >50 ug/mL AT 12 HOURS AFTER INGESTION ARE OFTEN ASSOCIATED WITH TOXIC REACTIONS.   cbc  Status: Abnormal   Collection Time: 10/24/16  3:30 PM  Result Value Ref Range   WBC 4.0 3.8 - 10.6 K/uL   RBC 4.16 (L) 4.40 - 5.90 MIL/uL   Hemoglobin 14.1 13.0 - 18.0 g/dL   HCT 41.9 40.0 - 52.0 %   MCV 100.8 (H) 80.0 - 100.0 fL   MCH 33.9 26.0 - 34.0 pg   MCHC 33.6 32.0 - 36.0 g/dL   RDW 13.1 11.5 - 14.5 %   Platelets 213 150 - 440 K/uL    No current facility-administered medications for this encounter.    No current outpatient prescriptions on file.    Musculoskeletal: Strength & Muscle Tone: within normal limits Gait & Station: normal Patient leans: N/A  Psychiatric Specialty Exam: Physical Exam  Nursing note and vitals reviewed. Constitutional: He appears well-developed and well-nourished.  HENT:  Head: Normocephalic and atraumatic.  Eyes: Conjunctivae are normal. Pupils are equal, round, and reactive to light.  Neck: Normal range of motion.  Cardiovascular: Regular rhythm and normal heart sounds.   Respiratory: Effort normal. No respiratory distress.  GI: Soft.  Musculoskeletal: Normal range of motion.  Neurological: He is alert.  Skin: Skin is warm and dry.  Psychiatric: His speech is normal and behavior is normal. Cognition and memory are normal. He expresses impulsivity. He exhibits a depressed mood. He expresses suicidal ideation. He expresses no suicidal plans.    Review of Systems  Constitutional: Negative.   HENT: Negative.   Eyes: Negative.   Respiratory: Negative.   Cardiovascular: Negative.   Gastrointestinal: Negative.   Musculoskeletal: Negative.   Skin:  Negative.   Neurological: Negative.   Psychiatric/Behavioral: Positive for depression and suicidal ideas. Negative for hallucinations, memory loss and substance abuse. The patient is nervous/anxious and has insomnia.     Blood pressure 122/68, pulse (!) 46, temperature 98.9 F (37.2 C), temperature source Oral, resp. rate 20, height _0  (1.753 m), weight 83.9 kg (185 lb), SpO2 97 %.Body mass index is 27.32 kg/m.  General Appearance: Casual  Eye Contact:  Fair  Speech:  Clear and Coherent  Volume:  Normal  Mood:  Dysphoric  Affect:  Appropriate  Thought Process:  Goal Directed  Orientation:  Full (Time, Place, and Person)  Thought Content:  Logical  Suicidal Thoughts:  Yes.  without intent/plan  Homicidal Thoughts:  No  Memory:  Immediate;   Good Recent;   Fair Remote;   Fair  Judgement:  Fair  Insight:  Fair  Psychomotor Activity:  Decreased  Concentration:  Concentration: Fair  Recall:  [employer] of Knowledge:  Fair  Language:  Fair  Akathisia:  No  Handed:  Right  AIMS (if indicated):     Assets:  Communication Skills Desire for Improvement  ADL's:  Intact  Cognition:  WNL  Sleep:        Treatment Plan Summary: Plan This is a 50 year old man presents to the emergency room claiming to be suicidal. It's pretty well documented yesterday at [facility] that he was also saying he was suicidal but recanted it when they thought they could get him into a homeless shelter. Although he is talking about being suicidal today he also has with him names of homeless shelters and reports that he really wants to get himself back on his feet. His suicidality appears to be situationally based for secondary gain. This despite the fact that he probably does have some mood problems. At this point we do not have a bed available for  admission. I will reconsult with TTS and social worker over the next half day and see how he is feeling in the morning.  Disposition: Supportive therapy provided about  ongoing stressors.  Alethia Berthold, MD 10/24/2016 5:24 PM

## 2016-10-24 NOTE — ED Notes (Signed)
BEHAVIORAL HEALTH ROUNDING Patient sleeping: No. Patient alert and oriented: yes Behavior appropriate: Yes.  ; If no, describe:  Nutrition and fluids offered: yes Toileting and hygiene offered: Yes  Sitter present: q15 minute observations and security  monitoring Law enforcement present: Yes  ODS  

## 2016-10-24 NOTE — ED Notes (Signed)

## 2016-10-25 ENCOUNTER — Inpatient Hospital Stay
Admission: RE | Admit: 2016-10-25 | Discharge: 2016-10-29 | DRG: 885 | Disposition: A | Discharge status: Home / Self Care | Payer: Medicaid Other | Source: Intra-hospital | Attending: Psychiatry | Admitting: Psychiatry

## 2016-10-25 DIAGNOSIS — Z590 Homelessness: Secondary | ICD-10-CM | POA: Diagnosis not present

## 2016-10-25 DIAGNOSIS — Z79899 Other long term (current) drug therapy: Secondary | ICD-10-CM

## 2016-10-25 DIAGNOSIS — E785 Hyperlipidemia, unspecified: Secondary | ICD-10-CM | POA: Diagnosis present

## 2016-10-25 DIAGNOSIS — F332 Major depressive disorder, recurrent severe without psychotic features: Principal | ICD-10-CM | POA: Diagnosis present

## 2016-10-25 DIAGNOSIS — J449 Chronic obstructive pulmonary disease, unspecified: Secondary | ICD-10-CM | POA: Diagnosis present

## 2016-10-25 DIAGNOSIS — Z9889 Other specified postprocedural states: Secondary | ICD-10-CM | POA: Diagnosis not present

## 2016-10-25 DIAGNOSIS — R45851 Suicidal ideations: Secondary | ICD-10-CM | POA: Diagnosis present

## 2016-10-25 DIAGNOSIS — Z7982 Long term (current) use of aspirin: Secondary | ICD-10-CM

## 2016-10-25 DIAGNOSIS — B20 Human immunodeficiency virus [HIV] disease: Secondary | ICD-10-CM | POA: Diagnosis present

## 2016-10-25 DIAGNOSIS — M1990 Unspecified osteoarthritis, unspecified site: Secondary | ICD-10-CM | POA: Diagnosis present

## 2016-10-25 DIAGNOSIS — G4700 Insomnia, unspecified: Secondary | ICD-10-CM | POA: Diagnosis present

## 2016-10-25 DIAGNOSIS — F4325 Adjustment disorder with mixed disturbance of emotions and conduct: Secondary | ICD-10-CM | POA: Diagnosis not present

## 2016-10-25 DIAGNOSIS — I10 Essential (primary) hypertension: Secondary | ICD-10-CM | POA: Diagnosis present

## 2016-10-25 DIAGNOSIS — N400 Enlarged prostate without lower urinary tract symptoms: Secondary | ICD-10-CM | POA: Diagnosis present

## 2016-10-25 DIAGNOSIS — K5900 Constipation, unspecified: Secondary | ICD-10-CM | POA: Diagnosis present

## 2016-10-25 DIAGNOSIS — F17210 Nicotine dependence, cigarettes, uncomplicated: Secondary | ICD-10-CM | POA: Diagnosis not present

## 2016-10-25 MED ORDER — ALUM & MAG HYDROXIDE-SIMETH 200-200-20 MG/5ML PO SUSP
30.0000 mL | ORAL | Status: DC | PRN
  Filled 2016-10-25: qty 30

## 2016-10-25 MED ORDER — AMLODIPINE BESYLATE 5 MG PO TABS
5.0000 mg | ORAL_TABLET | Freq: Every day | ORAL | Status: DC
  Filled 2016-10-25: qty 1

## 2016-10-25 MED ORDER — OXYMETAZOLINE HCL 0.05 % NA SOLN
2.0000 | Freq: Two times a day (BID) | NASAL | Status: DC | PRN
  Administered 2016-10-26 – 2016-10-29 (×5): 2 via NASAL
  Filled 2016-10-25: qty 15

## 2016-10-25 MED ORDER — ALBUTEROL SULFATE HFA 108 (90 BASE) MCG/ACT IN AERS
2.0000 | INHALATION_SPRAY | RESPIRATORY_TRACT | Status: DC | PRN
  Filled 2016-10-25: qty 6.7

## 2016-10-25 MED ORDER — POLYETHYLENE GLYCOL 3350 17 G PO PACK
17.0000 g | PACK | Freq: Every day | ORAL | Status: DC
  Administered 2016-10-25: 17 g via ORAL
  Filled 2016-10-25: qty 1

## 2016-10-25 MED ORDER — TAMSULOSIN HCL 0.4 MG PO CAPS
0.4000 mg | ORAL_CAPSULE | Freq: Every day | ORAL | Status: DC
  Administered 2016-10-26 – 2016-10-29 (×4): 0.4 mg via ORAL
  Filled 2016-10-25 (×5): qty 1

## 2016-10-25 MED ORDER — EMTRICITABINE-TENOFOVIR AF 200-25 MG PO TABS
1.0000 | ORAL_TABLET | Freq: Every day | ORAL | Status: DC
  Administered 2016-10-25: 1 via ORAL
  Filled 2016-10-25: qty 1

## 2016-10-25 MED ORDER — MAGNESIUM HYDROXIDE 400 MG/5ML PO SUSP
30.0000 mL | Freq: Every day | ORAL | Status: DC | PRN
  Filled 2016-10-25: qty 30

## 2016-10-25 MED ORDER — OXYCODONE HCL 5 MG PO TABS
5.0000 mg | ORAL_TABLET | ORAL | Status: DC | PRN
  Administered 2016-10-25 – 2016-10-28 (×3): 5 mg via ORAL
  Filled 2016-10-25 (×3): qty 1

## 2016-10-25 MED ORDER — DOCUSATE SODIUM 100 MG PO CAPS
100.0000 mg | ORAL_CAPSULE | Freq: Two times a day (BID) | ORAL | Status: DC
  Administered 2016-10-25: 100 mg via ORAL
  Filled 2016-10-25: qty 1

## 2016-10-25 MED ORDER — AMLODIPINE BESYLATE 5 MG PO TABS
5.0000 mg | ORAL_TABLET | Freq: Every day | ORAL | Status: DC
  Administered 2016-10-26 – 2016-10-29 (×4): 5 mg via ORAL
  Filled 2016-10-25 (×5): qty 1

## 2016-10-25 MED ORDER — EMTRICITABINE-TENOFOVIR AF 200-25 MG PO TABS
1.0000 | ORAL_TABLET | Freq: Every day | ORAL | Status: DC
  Administered 2016-10-26 – 2016-10-29 (×4): 1 via ORAL
  Filled 2016-10-25 (×4): qty 1

## 2016-10-25 MED ORDER — ASPIRIN EC 81 MG PO TBEC
81.0000 mg | DELAYED_RELEASE_TABLET | Freq: Every day | ORAL | Status: DC
  Administered 2016-10-25: 81 mg via ORAL
  Filled 2016-10-25: qty 1

## 2016-10-25 MED ORDER — ATORVASTATIN CALCIUM 20 MG PO TABS
40.0000 mg | ORAL_TABLET | Freq: Every day | ORAL | Status: DC
  Administered 2016-10-26 – 2016-10-28 (×3): 40 mg via ORAL
  Filled 2016-10-25 (×4): qty 2

## 2016-10-25 MED ORDER — POLYETHYLENE GLYCOL 3350 17 G PO PACK
17.0000 g | PACK | Freq: Every day | ORAL | Status: DC
  Administered 2016-10-26 – 2016-10-29 (×4): 17 g via ORAL
  Filled 2016-10-25 (×4): qty 1

## 2016-10-25 MED ORDER — ACETAMINOPHEN 325 MG PO TABS: 650.0000 mg | ORAL_TABLET | Freq: Four times a day (QID) | ORAL | Status: DC | PRN

## 2016-10-25 MED ORDER — GABAPENTIN 300 MG PO CAPS
300.0000 mg | ORAL_CAPSULE | Freq: Three times a day (TID) | ORAL | Status: DC
  Administered 2016-10-26 – 2016-10-29 (×11): 300 mg via ORAL
  Filled 2016-10-25 (×12): qty 1

## 2016-10-25 MED ORDER — ASPIRIN EC 81 MG PO TBEC
81.0000 mg | DELAYED_RELEASE_TABLET | Freq: Every day | ORAL | Status: DC
  Administered 2016-10-26 – 2016-10-29 (×4): 81 mg via ORAL
  Filled 2016-10-25 (×5): qty 1

## 2016-10-25 MED ORDER — CITALOPRAM HYDROBROMIDE 20 MG PO TABS
20.0000 mg | ORAL_TABLET | Freq: Every day | ORAL | Status: DC
  Administered 2016-10-25: 20 mg via ORAL
  Filled 2016-10-25: qty 1

## 2016-10-25 MED ORDER — ATORVASTATIN CALCIUM 20 MG PO TABS
40.0000 mg | ORAL_TABLET | Freq: Every day | ORAL | Status: DC
  Administered 2016-10-25: 40 mg via ORAL
  Filled 2016-10-25: qty 2

## 2016-10-25 MED ORDER — LISINOPRIL 20 MG PO TABS
20.0000 mg | ORAL_TABLET | Freq: Every day | ORAL | Status: DC
  Administered 2016-10-25: 20 mg via ORAL
  Filled 2016-10-25: qty 1

## 2016-10-25 MED ORDER — CITALOPRAM HYDROBROMIDE 20 MG PO TABS
20.0000 mg | ORAL_TABLET | Freq: Every day | ORAL | Status: DC
  Administered 2016-10-26 – 2016-10-29 (×4): 20 mg via ORAL
  Filled 2016-10-25 (×5): qty 1

## 2016-10-25 MED ORDER — DOLUTEGRAVIR SODIUM 50 MG PO TABS
50.0000 mg | ORAL_TABLET | Freq: Every day | ORAL | Status: DC
  Administered 2016-10-25: 50 mg via ORAL
  Filled 2016-10-25: qty 1

## 2016-10-25 MED ORDER — DOCUSATE SODIUM 100 MG PO CAPS
100.0000 mg | ORAL_CAPSULE | Freq: Two times a day (BID) | ORAL | Status: DC
  Administered 2016-10-25 – 2016-10-29 (×8): 100 mg via ORAL
  Filled 2016-10-25 (×9): qty 1

## 2016-10-25 MED ORDER — GABAPENTIN 300 MG PO CAPS
300.0000 mg | ORAL_CAPSULE | Freq: Three times a day (TID) | ORAL | Status: DC
  Administered 2016-10-25: 300 mg via ORAL
  Filled 2016-10-25: qty 1

## 2016-10-25 MED ORDER — DOLUTEGRAVIR SODIUM 50 MG PO TABS
50.0000 mg | ORAL_TABLET | Freq: Every day | ORAL | Status: DC
  Administered 2016-10-26 – 2016-10-29 (×4): 50 mg via ORAL
  Filled 2016-10-25 (×5): qty 1

## 2016-10-25 MED ORDER — LISINOPRIL 10 MG PO TABS
20.0000 mg | ORAL_TABLET | Freq: Every day | ORAL | Status: DC
  Administered 2016-10-26 – 2016-10-29 (×4): 20 mg via ORAL
  Filled 2016-10-25 (×5): qty 2

## 2016-10-25 MED ORDER — ALBUTEROL SULFATE HFA 108 (90 BASE) MCG/ACT IN AERS
2.0000 | INHALATION_SPRAY | RESPIRATORY_TRACT | Status: DC | PRN
  Administered 2016-10-26 – 2016-10-29 (×6): 2 via RESPIRATORY_TRACT
  Filled 2016-10-25: qty 6.7

## 2016-10-25 MED ORDER — TAMSULOSIN HCL 0.4 MG PO CAPS
0.4000 mg | ORAL_CAPSULE | Freq: Every day | ORAL | Status: DC
Start: 2016-10-26 — End: 2016-10-25

## 2016-10-25 NOTE — ED Notes (Signed)
Pt requested graham crackers and ginger ale. RN Susy Frizzle approved because the pt was sleeping when dinner was served earlier. Graham crackers and ginger ale were provided.

## 2016-10-25 NOTE — ED Notes (Signed)
This RN gave update to Enzo Montgomery, [phone], with patient permission at this time.

## 2016-10-25 NOTE — ED Notes (Signed)
NAD noted at this time. Pt resting in bed. Denies any needs at this time. Will continue to monitor for further patient needs.

## 2016-10-25 NOTE — ED Notes (Signed)
Pt using telephone  

## 2016-10-25 NOTE — ED Notes (Signed)
Pt given breakfast tray

## 2016-10-25 NOTE — ED Provider Notes (Signed)
-----------------------------------------   6:13 AM on 10/25/2016 -----------------------------------------   Blood pressure (!) 151/92, pulse 63, temperature 98.2 F (36.8 C), temperature source Oral, resp. rate 20, height 5\' 9"  (1.753 m), weight 185 lb (83.9 kg), SpO2 99 %.  The patient had no acute events since last update.  Calm and cooperative at this time.  Disposition is pending Psychiatry/Behavioral Medicine team recommendations.     Irean Hong, MD 10/25/16 [phone]

## 2016-10-25 NOTE — ED Notes (Signed)
Pt d/c to lower level BHU at this time, report called to [facility], pt taken to lobby by Alinda Money, EDT.

## 2016-10-25 NOTE — BH Assessment (Signed)
Assessment Note  Roy Oleson Sr. is an 50 y.o. male who presents to the ER due to voicing SI with plan to overdose on medications. Patient reports of having several factors that are contributing to his current mental and emotional state. His mother passed away approximately "a month and a half ago." His relationship with his siblings is strained and after the death of his mother, it worsened. He's currently homeless, on yesterday (10/24/2016) there was no beds at the [facility]) shelter. The early part of this month he had surgery on his right foot and now he is temporarily unable to walk like he was prior to the surgery. He have to use a walker.  He admits to past substance use. Main drug of use is; alcohol and cocaine. Reported last use of alcohol was 07/2016. The last use of cocaine use was 06/2016.  Patient denies current involvement with the legal system and DSS. He denies having history of aggression and violence. During the interview, he was calm, cooperative and pleasant.  Patient denies HI and AV/H.  Diagnosis: Depression  Past Medical History:  Past Medical History:  Diagnosis Date  . Arthritis   . Asthma   . Hepatitis C   . HIV (human immunodeficiency virus infection) (HCC)   . HTN (hypertension)     Past Surgical History:  Procedure Laterality Date  . HERNIA REPAIR    . TOE SURGERY      Family History: No family history on file.  Social History:  reports that he has been smoking Cigarettes.  He has never used smokeless tobacco. He reports that he does not drink alcohol. His drug history is not on file.  Additional Social History:  Alcohol / Drug Use Pain Medications: See PTA Prescriptions: See PTA Over the Counter: See PTA History of alcohol / drug use?: Yes Longest period of sobriety (when/how long): "I really can't say (Unable to quantify)" Negative Consequences of Use: Personal relationships, Legal, Financial, Work / School Substance #1 Name of Substance  1: Alcohol 1 - Age of First Use: 9 1 - Amount (size/oz): Case of beer (24, 24oz) 1 - Frequency: Use to be daily 1 - Duration: On and off for 25 years 1 - Last Use / Amount: 07/2016 Substance #2 Name of Substance 2: Cocaine 2 - Age of First Use: 43 2 - Amount (size/oz): $500 to $600 2 - Frequency: Daily 2 - Duration: 6 years 2 - Last Use / Amount: 06/2016  CIWA: CIWA-Ar BP: (!) 133/94 Pulse Rate: (!) 52 COWS:    Allergies: No Known Allergies  Home Medications:  (Not in a hospital admission)  OB/GYN Status:  No LMP for male patient.  General Assessment Data Location of Assessment: [facility] ED TTS Assessment: In system Is this a Tele or Face-to-Face Assessment?: Face-to-Face Is this an Initial Assessment or a Re-assessment for this encounter?: Initial Assessment Marital status: Single Maiden name: n/a Is patient pregnant?: No Pregnancy Status: No Living Arrangements: Spouse/significant other Can pt return to current living arrangement?: No Admission Status: Voluntary Is patient capable of signing voluntary admission?: No Referral Source: Self/Family/Friend Insurance type: None  Medical Screening Exam [facility] Walk-in ONLY) Medical Exam completed: Yes  Crisis Care Plan Living Arrangements: Spouse/significant other Legal Guardian: Other: (None) Name of Psychiatrist: Reports of none Name of Therapist: Reports of none  Education Status Is patient currently in school?: No Current Grade: n/a Highest grade of school patient has completed: 6th grade Name of school: n/a Contact person: n/a  Risk to self with the past 6 months Suicidal Ideation: Yes-Currently Present Has patient been a risk to self within the past 6 months prior to admission? : Yes Suicidal Intent: No Has patient had any suicidal intent within the past 6 months prior to admission? : No Is patient at risk for suicide?: Yes Suicidal Plan?: Yes-Currently Present Has patient had any suicidal plan within the  past 6 months prior to admission? : Yes Specify Current Suicidal Plan: Overdose on medication Access to Means: Yes Specify Access to Suicidal Means: Have medication What has been your use of drugs/alcohol within the last 12 months?: Alcohol & Cocaine Previous Attempts/Gestures: No Other Self Harm Risks: Addiction  Triggers for Past Attempts: Other (Comment), Other personal contacts, Family contact (Active addiction ) Intentional Self Injurious Behavior: None Family Suicide History: Unknown Recent stressful life event(s): Other (Comment), Conflict (Comment), Financial Problems, Trauma (Comment) Persecutory voices/beliefs?: No Depression: Yes Depression Symptoms: Feeling worthless/self pity, Loss of interest in usual pleasures, Guilt, Fatigue, Isolating, Tearfulness Substance abuse history and/or treatment for substance abuse?: No Suicide prevention information given to non-admitted patients: Not applicable  Risk to Others within the past 6 months Homicidal Ideation: No Does patient have any lifetime risk of violence toward others beyond the six months prior to admission? : No Thoughts of Harm to Others: No Current Homicidal Intent: No Current Homicidal Plan: No Access to Homicidal Means: No Identified Victim: Reports of none History of harm to others?: No Assessment of Violence: None Noted Violent Behavior Description: Reports of none Does patient have access to weapons?: No Criminal Charges Pending?: No Does patient have a court date: No Is patient on probation?: No  Psychosis Hallucinations: None noted Delusions: None noted  Mental Status Report Appearance/Hygiene: Unremarkable, In scrubs Eye Contact: Good Motor Activity: Unremarkable, Freedom of movement Speech: Soft, Logical/coherent, Unremarkable Level of Consciousness: Alert Mood: Depressed, Anxious, Helpless, Sad, Pleasant Affect: Appropriate to circumstance, Anxious, Sad, Depressed Anxiety Level: Minimal Thought  Processes: Coherent, Relevant Judgement: Unimpaired Orientation: Person, Place, Time, Situation, Appropriate for developmental age Obsessive Compulsive Thoughts/Behaviors: Minimal  Cognitive Functioning Concentration: Normal Memory: Recent Intact, Remote Intact IQ: Average Insight: Fair Impulse Control: Poor Appetite: Fair Weight Loss: 0 Weight Gain: 0 Sleep: No Change Total Hours of Sleep: 8 Vegetative Symptoms: None  ADLScreening [facility] Assessment Services) [name] cognitive ability adequate to safely complete daily activities?: Yes Patient able to express need for assistance with ADLs?: Yes Independently performs ADLs?: Yes (appropriate for developmental age)  Prior Inpatient Therapy Prior Inpatient Therapy: Yes Prior Therapy Dates: 09/2016 Prior Therapy Facilty/Provider(s): [facility] Reason for Treatment: Depression  Prior Outpatient Therapy Prior Outpatient Therapy: Yes Prior Therapy Dates: Current Prior Therapy Facilty/Provider(s): [facility] Reason for Treatment: Depression Does patient have an ACCT team?: No Does patient have [facility] services? : No Does patient have P4CC services?: No  ADL Screening (condition at time of admission) [name] cognitive ability adequate to safely complete daily activities?: Yes Is the patient deaf or have difficulty hearing?: No Does the patient have difficulty seeing, even when wearing glasses/contacts?: No Does the patient have difficulty concentrating, remembering, or making decisions?: No Patient able to express need for assistance with ADLs?: Yes Does the patient have difficulty dressing or bathing?: No Independently performs ADLs?: Yes (appropriate for developmental age) Does the patient have difficulty walking or climbing stairs?: No Weakness of Legs: None Weakness of Arms/Hands: None  Home Assistive Devices/Equipment Home Assistive Devices/Equipment: None  Therapy Consults (therapy  consults  require a physician order) PT Evaluation Needed: No OT Evalulation Needed: No SLP Evaluation Needed: No Abuse/Neglect Assessment (Assessment to be complete while patient is alone) Physical Abuse: Denies Verbal Abuse: Denies Sexual Abuse: Denies Exploitation of patient/patient's resources: Denies Self-Neglect: Denies Values / Beliefs Cultural Requests During Hospitalization: None Spiritual Requests During Hospitalization: None Consults Spiritual Care Consult Needed: No Social Work Consult Needed: No Merchant navy officerAdvance Directives (For Healthcare) Does patient have an advance directive?: No Would patient like information on creating an advanced directive?: No - patient declined information    Additional Information 1:1 In Past 12 Months?: No CIRT Risk: No Elopement Risk: No Does patient have medical clearance?: Yes  Child/Adolescent Assessment Running Away Risk: Denies (Patient is an adult)  Disposition:  Disposition Initial Assessment Completed for this Encounter: Yes Disposition of Patient: Other dispositions  On Site Evaluation by:   Reviewed with Physician:    Lilyan Gilfordalvin J. Jcion Buddenhagen MS, LCAS, LPC, NCC, CCSI Therapeutic Triage Specialist 10/25/2016 10:49 AM

## 2016-10-25 NOTE — ED Notes (Signed)
Pt resting in bed at this time. Respirations even and unlabored at this time. Will continue to monitor for further patient needs.

## 2016-10-25 NOTE — ED Notes (Signed)
NAD noted at this time. Pt resting in bed. Pt up intermittently with his walker. Denies any needs at this time.

## 2016-10-25 NOTE — Progress Notes (Signed)
Skin and contraband search completed and witnessed by [facility]. Pt has surgical incision to right foot, intact, no redness, bleeding. No other areas noted. No contraband found on patient.

## 2016-10-25 NOTE — BH Assessment (Signed)
Patient is to be admitted to [facility] by Dr. Toni Amend.  Attending Physician will be Dr. Jennet Maduro.   Patient has been assigned to room 324, by [facility] Charge Nurse Victorino Dike.   Intake Paper Work has been signed and placed on patient chart.  ER staff is aware of the admission [facility]).

## 2016-10-25 NOTE — ED Notes (Signed)
Pt to the door to return phone at this time. NAD noted. Pt noted to use walker appropriately at this time. Will continue to monitor for further patient needs.

## 2016-10-25 NOTE — Tx Team (Signed)
Initial Treatment Plan 10/25/2016 8:12 PM Kelen Ryals Toda Sr. GQB:[id number]    PATIENT STRESSORS: Financial difficulties Health problems   PATIENT STRENGTHS: Communication skills General fund of knowledge   PATIENT IDENTIFIED PROBLEMS: "Get me a place when I'm out"   Suicidal Ideations   Depression                  DISCHARGE CRITERIA:  Improved stabilization in mood, thinking, and/or behavior  PRELIMINARY DISCHARGE PLAN: Outpatient therapy  PATIENT/FAMILY INVOLVEMENT: This treatment plan has been presented to and reviewed with the patient, Diangelo Biber Sr., and/or family member.  The patient and family have been given the opportunity to ask questions and make suggestions.  Lendell Caprice, RN 10/25/2016, 8:12 PM

## 2016-10-25 NOTE — Progress Notes (Signed)
Patient ID: Roy Drechsler Sr., male   DOB: [birth year], 50 y.o.   MRN: [id number] Patient admitted stating, "I'm tired of living." Patient states he doesn't trust anybody anymore and everybody lies to him. He endorses SI but contracts for safety. Denies HI/AVH. Patient compliant with admission. Patient states he had surgery on his left foot recently. Sutures still in foot. Clean dry and intact. Patient asked for medication for pain and states he had been taking oxy IR and it is in his home meds sheet. MD notified and order put in. Patient oriented to unit. Safety maintained with 15 min checks.

## 2016-10-25 NOTE — BH Assessment (Signed)
[facility] Assessment Progress Note  Patient accepted to [facility]. Attending Dr. Jennet Maduro. Rm [id number]

## 2016-10-25 NOTE — Consult Note (Signed)
[place] [facility] Consult   Reason for Consult:  Consult for 50 year old man with a history of multiple medical problems who came to the emergency room voicing suicidal ideation Referring Physician:  Corky [name] Patient Identification: Roy Payment Oberhaus Sr. MRN:  [id number] Principal Diagnosis: Adjustment disorder with mixed disturbance of emotions and conduct Diagnosis:   Patient Active Problem List   Diagnosis Date Noted  . Adjustment disorder with mixed disturbance of emotions and conduct [F43.25] 10/24/2016    Total Time spent with patient: 20 minutes  Subjective:   Roy Guild Sr. is a 50 y.o. male patient admitted with "I've just been feeling suicidal".  Patient seen for follow-up assessment today. Chart reviewed. Patient continues to state symptoms of depression. Passive suicidal thoughts. Feelings of hopelessness. His affect is irritable. He is withdrawn and blunted in his affect. At this point patient seems to still be functioning very poorly. He was able to discuss his outpatient treatment but still seems hopeless about it. He had not been restarted on any of his normal medicines but I've been able to get what appears to be a reasonable updated list of what he is supposed to be taking from his [facility] notes.  HPI:  Patient interviewed. Chart reviewed including his notes from other hospitals most specifically including the note from the [facility] emergency room yesterday. This is a 50 year old man who comes into the emergency room saying he is having suicidal thoughts. He tells me that he's been having suicidal thoughts for the last few days. He feels like everything is hopeless and known will ever help him and so he should just give up. He says he's been thinking about "taking a bunch of pills". He can't really specify why it is that he hasn't followed up on it and done anything yet. He says he's been depressed for about a month and he attributes it to the death of his mother.  [name] died a couple months ago and since then he has not had her to fall back on. He's had trouble having a stable place to live. He expresses resentment against his siblings for not helping him more. Right now he appears to be homeless. Was at the [facility] emergency room yesterday with an identical set of complaints but recanted the suicidality when they said they could get him into a shelter. There was no bed available at the [facility] today and so his case manager dropped him at our emergency room. Patient denies being on any medicine for depression although it's clear from the note yesterday that he is supposed to be on citalopram. He doesn't seem to have a very clear idea of any of his medical care. He denies that he's been drinking or using any drugs.  Social history: Mother is deceased. Patient is disabled. He complains about how he can't get full benefits because of his history of convictions in the past. Has several siblings but sounds like he doesn't have very good relationships with them.  Medical history: HIV-positive. Hepatitis C positive. Patient claims that he is compliant with all of his medications. He has a bag of medicine with him however and none of it is his HIV medicine.  Substance abuse history: Says that he hasn't used any alcohol or cocaine in about 3 or 4 months.  Past Psychiatric History: Patient has had prior psychiatric admissions he says many years ago at our facility probably 22 or 14 years ago. He has been followed for his mental health treatment largely  through the HIV clinic. He is supposed to be on citalopram currently. He claims that he is supposed to be on Abilify but I don't see any mention of that recently. He says there was a time in the past when he thought seriously about killing himself by an overdose. Not clear that he ever really followed through on it. No history of mania.  Risk to Self: Suicidal Ideation: Yes-Currently Present Suicidal Intent:  No Is patient at risk for suicide?: Yes Suicidal Plan?: Yes-Currently Present Specify Current Suicidal Plan: Overdose on medication Access to Means: Yes Specify Access to Suicidal Means: Have medication What has been your use of drugs/alcohol within the last 12 months?: Alcohol & Cocaine Other Self Harm Risks: Addiction  Triggers for Past Attempts: Other (Comment), Other personal contacts, Family contact (Active addiction ) Intentional Self Injurious Behavior: None Risk to Others: Homicidal Ideation: No Thoughts of Harm to Others: No Current Homicidal Intent: No Current Homicidal Plan: No Access to Homicidal Means: No Identified Victim: Reports of none History of harm to others?: No Assessment of Violence: None Noted Violent Behavior Description: Reports of none Does patient have access to weapons?: No Criminal Charges Pending?: No Does patient have a court date: No Prior Inpatient Therapy: Prior Inpatient Therapy: Yes Prior Therapy Dates: 09/2016 Prior Therapy Facilty/Provider(s): [facility] Reason for Treatment: Depression Prior Outpatient Therapy: Prior Outpatient Therapy: Yes Prior Therapy Dates: Current Prior Therapy Facilty/Provider(s): [facility] Reason for Treatment: Depression Does patient have an ACCT team?: No Does patient have [facility] services? : No Does patient have [facility] services?: No  Past Medical History:  Past Medical History:  Diagnosis Date  . Arthritis   . Asthma   . Hepatitis C   . HIV (human immunodeficiency virus infection) ([place])   . HTN (hypertension)     Past Surgical History:  Procedure Laterality Date  . HERNIA REPAIR    . TOE SURGERY     Family History: No family history on file. Family Psychiatric  History: No known family history Social History:  History  Alcohol Use No     History  Drug use: Unknown    Social History   Social History  . Marital status: Single    Spouse name: N/A  . Number of  children: N/A  . Years of education: N/A   Social History Main Topics  . Smoking status: Current Every Day Smoker    Types: Cigarettes  . Smokeless tobacco: Never Used  . Alcohol use No  . Drug use: Unknown  . Sexual activity: Yes   Other Topics Concern  . None   Social History Narrative  . None   Additional Social History:    Allergies:  No Known Allergies  Labs:  Results for orders placed or performed during the hospital encounter of 10/24/16 (from the past 48 hour(s))  Comprehensive metabolic panel     Status: Abnormal   Collection Time: 10/24/16  3:30 PM  Result Value Ref Range   Sodium 139 135 - 145 mmol/L   Potassium 4.4 3.5 - 5.1 mmol/L   Chloride 107 101 - 111 mmol/L   CO2 27 22 - 32 mmol/L   Glucose, Bld 88 65 - 99 mg/dL   BUN 16 6 - 20 mg/dL   Creatinine, Ser 1.23 0.61 - 1.24 mg/dL   Calcium 8.8 (L) 8.9 - 10.3 mg/dL   Total Protein 7.4 6.5 - 8.1 g/dL   Albumin 3.6 3.5 - 5.0 g/dL  AST 29 15 - 41 U/L   ALT 33 17 - 63 U/L   Alkaline Phosphatase 60 38 - 126 U/L   Total Bilirubin 0.2 (L) 0.3 - 1.2 mg/dL   GFR calc non Af Amer >60 >60 mL/min   GFR calc Af Amer >60 >60 mL/min    Comment: (NOTE) The eGFR has been calculated using the CKD EPI equation. This calculation has not been validated in all clinical situations. eGFR's persistently <60 mL/min signify possible Chronic Kidney Disease.    Anion gap 5 5 - 15  Ethanol     Status: None   Collection Time: 10/24/16  3:30 PM  Result Value Ref Range   Alcohol, Ethyl (B) <5 <5 mg/dL    Comment:        LOWEST DETECTABLE LIMIT FOR SERUM ALCOHOL IS 5 mg/dL FOR MEDICAL PURPOSES ONLY   Salicylate level     Status: None   Collection Time: 10/24/16  3:30 PM  Result Value Ref Range   Salicylate Lvl <3.5 2.8 - 30.0 mg/dL  Acetaminophen level     Status: Abnormal   Collection Time: 10/24/16  3:30 PM  Result Value Ref Range   Acetaminophen (Tylenol), Serum <10 (L) 10 - 30 ug/mL    Comment:        THERAPEUTIC  CONCENTRATIONS VARY SIGNIFICANTLY. A RANGE OF 10-30 ug/mL MAY BE AN EFFECTIVE CONCENTRATION FOR MANY PATIENTS. HOWEVER, SOME ARE BEST TREATED AT CONCENTRATIONS OUTSIDE THIS RANGE. ACETAMINOPHEN CONCENTRATIONS >150 ug/mL AT 4 HOURS AFTER INGESTION AND >50 ug/mL AT 12 HOURS AFTER INGESTION ARE OFTEN ASSOCIATED WITH TOXIC REACTIONS.   cbc     Status: Abnormal   Collection Time: 10/24/16  3:30 PM  Result Value Ref Range   WBC 4.0 3.8 - 10.6 K/uL   RBC 4.16 (L) 4.40 - 5.90 MIL/uL   Hemoglobin 14.1 13.0 - 18.0 g/dL   HCT 41.9 40.0 - 52.0 %   MCV 100.8 (H) 80.0 - 100.0 fL   MCH 33.9 26.0 - 34.0 pg   MCHC 33.6 32.0 - 36.0 g/dL   RDW 13.1 11.5 - 14.5 %   Platelets 213 150 - 440 K/uL    Current Facility-Administered Medications  Medication Dose Route Frequency Provider Last Rate Last Dose  . albuterol (PROVENTIL HFA;VENTOLIN HFA) 108 (90 Base) MCG/ACT inhaler 2 puff  2 puff Inhalation Q4H PRN Gonzella Lex, MD      . amLODipine (NORVASC) tablet 5 mg  5 mg Oral Daily Gonzella Lex, MD      . aspirin EC tablet 81 mg  81 mg Oral Daily Gonzella Lex, MD      . atorvastatin (LIPITOR) tablet 40 mg  40 mg Oral q1800 Gonzella Lex, MD      . citalopram (CELEXA) tablet 20 mg  20 mg Oral Daily John T Clapacs, MD      . docusate sodium (COLACE) capsule 100 mg  100 mg Oral BID Gonzella Lex, MD      . dolutegravir (TIVICAY) tablet 50 mg  50 mg Oral Daily Gonzella Lex, MD      . emtricitabine-tenofovir AF (DESCOVY) 200-25 MG per tablet 1 tablet  1 tablet Oral Daily John T Clapacs, MD      . gabapentin (NEURONTIN) tablet 300 mg  300 mg Oral TID Gonzella Lex, MD      . lisinopril (PRINIVIL,ZESTRIL) tablet 20 mg  20 mg Oral Daily Gonzella Lex, MD      .  polyethylene glycol (MIRALAX / GLYCOLAX) packet 17 g  17 g Oral Daily Gonzella Lex, MD      . Derrill Memo ON 10/26/2016] tamsulosin (FLOMAX) capsule 0.4 mg  0.4 mg Oral QPC breakfast Gonzella Lex, MD       Current Outpatient Prescriptions   Medication Sig Dispense Refill  . acetaminophen (TYLENOL) 500 MG tablet Take 500 mg by mouth every 8 (eight) hours as needed.    Marland Kitchen aspirin EC 81 MG tablet Take 81 mg by mouth daily.    Marland Kitchen docusate sodium (COLACE) 100 MG capsule Take by mouth 2 (two) times daily.    . ergocalciferol (VITAMIN D2) 50000 units capsule Take 50,000 Units by mouth once a week.    . gabapentin (NEURONTIN) 100 MG capsule Take 100 mg by mouth 3 (three) times daily.    Marland Kitchen oxycodone (OXY-IR) 5 MG capsule Take 5 mg by mouth every 4 (four) hours as needed.    . promethazine (PHENERGAN) 12.5 MG tablet Take 12.5 mg by mouth every 6 (six) hours as needed for nausea or vomiting.      Musculoskeletal: Strength & Muscle Tone: within normal limits Gait & Station: normal Patient leans: N/A  Psychiatric Specialty Exam: Physical Exam  Nursing note and vitals reviewed. Constitutional: He appears well-developed and well-nourished.  HENT:  Head: Normocephalic and atraumatic.  Eyes: Conjunctivae are normal. Pupils are equal, round, and reactive to light.  Neck: Normal range of motion.  Cardiovascular: Regular rhythm and normal heart sounds.   Respiratory: Effort normal. No respiratory distress.  GI: Soft.  Musculoskeletal: Normal range of motion.  Neurological: He is alert.  Skin: Skin is warm and dry.  Psychiatric: His speech is normal and behavior is normal. Cognition and memory are normal. He expresses impulsivity. He exhibits a depressed mood. He expresses suicidal ideation. He expresses no suicidal plans.    Review of Systems  Constitutional: Negative.   HENT: Negative.   Eyes: Negative.   Respiratory: Negative.   Cardiovascular: Negative.   Gastrointestinal: Negative.   Musculoskeletal: Negative.   Skin: Negative.   Neurological: Negative.   Psychiatric/Behavioral: Positive for depression and suicidal ideas. Negative for hallucinations, memory loss and substance abuse. The patient is nervous/anxious and has  insomnia.     Blood pressure (!) 133/94, pulse (!) 52, temperature 98.8 F (37.1 C), temperature source Oral, resp. rate 20, height '5\' 9"'$  (1.753 m), weight 83.9 kg (185 lb), SpO2 99 %.Body mass index is 27.32 kg/m.  General Appearance: Casual  Eye Contact:  Fair  Speech:  Clear and Coherent  Volume:  Normal  Mood:  Dysphoric  Affect:  Appropriate  Thought Process:  Goal Directed  Orientation:  Full (Time, Place, and Person)  Thought Content:  Logical  Suicidal Thoughts:  Yes.  without intent/plan  Homicidal Thoughts:  No  Memory:  Immediate;   Good Recent;   Fair Remote;   Fair  Judgement:  Fair  Insight:  Fair  Psychomotor Activity:  Decreased  Concentration:  Concentration: Fair  Recall:  [employer] of Knowledge:  Fair  Language:  Fair  Akathisia:  No  Handed:  Right  AIMS (if indicated):     Assets:  Communication Skills Desire for Improvement  ADL's:  Intact  Cognition:  WNL  Sleep:        Treatment Plan Summary: Plan After talking with the patient again it seems that the best thing to do at this point would simply be to admit him to the  hospital for his depression. Reviewed plan with patient who is agreeable. I have restarted him on medication going by the notes from [facility] and a way that seems to be most reasonable. Doesn't seem to be obviously needed to restart narcotics that can be considered downstairs. Continue the citalopram for depression. Restart HIV medicines and medicines for blood pressure. Orders done for admission. 15 minute checks. Usual groups and activities.  Disposition: Recommend psychiatric Inpatient admission when medically cleared. Supportive therapy provided about ongoing stressors.  Alethia Berthold, MD 10/25/2016 1:30 PM

## 2016-10-25 NOTE — ED Notes (Signed)
Report called to Gwen,RN.

## 2016-10-26 ENCOUNTER — Encounter: Payer: Self-pay | Admitting: Psychiatry

## 2016-10-26 DIAGNOSIS — E785 Hyperlipidemia, unspecified: Secondary | ICD-10-CM | POA: Diagnosis present

## 2016-10-26 DIAGNOSIS — N400 Enlarged prostate without lower urinary tract symptoms: Secondary | ICD-10-CM | POA: Diagnosis present

## 2016-10-26 DIAGNOSIS — F332 Major depressive disorder, recurrent severe without psychotic features: Principal | ICD-10-CM

## 2016-10-26 DIAGNOSIS — K5900 Constipation, unspecified: Secondary | ICD-10-CM | POA: Diagnosis present

## 2016-10-26 DIAGNOSIS — B20 Human immunodeficiency virus [HIV] disease: Secondary | ICD-10-CM | POA: Diagnosis present

## 2016-10-26 DIAGNOSIS — I10 Essential (primary) hypertension: Secondary | ICD-10-CM | POA: Diagnosis present

## 2016-10-26 LAB — LIPID PANEL
CHOLESTEROL: 138 mg/dL (ref 0–200)
HDL: 57 mg/dL (ref >40)
LDL Cholesterol: 69 mg/dL (ref 0–99)
Total CHOL/HDL Ratio: 2.4 RATIO
Triglycerides: 62 mg/dL (ref <150)
VLDL: 12 mg/dL (ref 0–40)

## 2016-10-26 LAB — URINE DRUG SCREEN, QUALITATIVE (ARMC ONLY)
AMPHETAMINES, UR SCREEN: NOT DETECTED
Barbiturates, Ur Screen: NOT DETECTED
Benzodiazepine, Ur Scrn: NOT DETECTED
COCAINE METABOLITE, UR ~~LOC~~: NOT DETECTED
Cannabinoid 50 Ng, Ur ~~LOC~~: NOT DETECTED
MDMA (ECSTASY) UR SCREEN: NOT DETECTED
Methadone Scn, Ur: NOT DETECTED
Opiate, Ur Screen: NOT DETECTED
Phencyclidine (PCP) Ur S: NOT DETECTED
TRICYCLIC, UR SCREEN: NOT DETECTED

## 2016-10-26 LAB — TSH: TSH: 2.372 u[IU]/mL (ref 0.350–4.500)

## 2016-10-26 MED ORDER — ENSURE ENLIVE PO LIQD
237.0000 mL | Freq: Three times a day (TID) | ORAL | Status: DC
  Administered 2016-10-26 – 2016-10-29 (×9): 237 mL via ORAL

## 2016-10-26 NOTE — Progress Notes (Signed)
Recreation Therapy Notes  Date: 11.10.17 Time: 1:00 pm Location: Craft Room  Group Topic: Social Skills  Goal Area(s) Addresses:  Patient will participate in healthy coping skill. Patient will verbalize benefit of using art as a coping skill.  Behavioral Response: Attentive, Interactive  Intervention: [employer]  Activity: Patients were given 15 pipe cleaners and were instructed to build a free standing tower. Patients were given 2 minutes to strategize. After about 5 minutes of building, patients were instructed to put their dominant hand behind their backs and continue building.  Education: LRT educated patients on healthy support systems.  Education Outcome: Acknowledges education/In group clarification offered  Clinical Observations/Feedback: Patient worked with peer to build tower. Patient used effective communication, problem solving, and teamwork skills. Patient contributed to group discussion by stating his team was successful, what skills they used to be successful, why these skills are important, what prevents him from using these skills at home, how he felt after using these skills in group, and what would change for him if he used these skills post d/c.  Jacquelynn Cree, LRT/CTRS 10/26/2016 3:04 PM

## 2016-10-26 NOTE — Plan of Care (Signed)
Problem: Safety: Goal: Ability to remain free from injury will improve Outcome: Progressing No injury reported or observed   
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
## 2016-10-26 NOTE — [facility] Group Notes (Signed)
[facility] LCSW Group Therapy  10/26/2016 2:05 PM  Type of Therapy:  Group Therapy  Participation Level:  Active  Participation Quality:  Attentive  Affect:  Appropriate  Cognitive:  Alert  Insight:  Limited  Engagement in Therapy:  Limited  Modes of Intervention:  Activity, Discussion, Education and Support  Summary of Progress/Problems:Feelings around Relapse. Group members discussed the meaning of relapse and shared personal stories of relapse, how it affected them and others, and how they perceived themselves during this time. Group members were encouraged to identify triggers, warning signs and coping skills used when facing the possibility of relapse. Social supports were discussed and explored in detail. Patients also discussed facing disappointment and how that can trigger someone to relapse.   [name] G. Garnette Czech MSW, LCSWA 10/26/2016, 2:05 PM

## 2016-10-26 NOTE — [facility] Suicide Risk Assessment (Signed)
[facility] Admission Suicide Risk Assessment   Nursing information obtained from:  Patient Demographic factors:  Male, Divorced or widowed, Low socioeconomic status, Unemployed Current Mental Status:  Suicidal ideation indicated by patient, Self-harm thoughts, Intention to act on suicide plan, Suicide plan Loss Factors:  Financial problems / change in socioeconomic status Historical Factors:  Prior suicide attempts ("five or six years ago"-SA) Risk Reduction Factors:  Living with another person, especially a relative, Positive social support  Total Time spent with patient: 1 hour Principal Problem: Severe recurrent major depression without psychotic features (HCC) Diagnosis:   Patient Active Problem List   Diagnosis Date Noted  . Tobacco use disorder [F17.200] 10/26/2016  . HIV disease (HCC) [B20] 10/26/2016  . HTN (hypertension) [I10] 10/26/2016  . Dyslipidemia [E78.5] 10/26/2016  . BPH (benign prostatic hyperplasia) [N40.0] 10/26/2016  . Constipation [K59.00] 10/26/2016  . Severe recurrent major depression without psychotic features (HCC) [F33.2] 10/25/2016   Subjective Data: Suicidal ideation.  Continued Clinical Symptoms:  Alcohol Use Disorder Identification Test Final Score (AUDIT): 0 The "Alcohol Use Disorders Identification Test", Guidelines for Use in Primary Care, Second Edition.  World Science writer [facility]). Score between 0-7:  no or low risk or alcohol related problems. Score between 8-15:  moderate risk of alcohol related problems. Score between 16-19:  high risk of alcohol related problems. Score 20 or above:  warrants further diagnostic evaluation for alcohol dependence and treatment.   CLINICAL FACTORS:   Bipolar Disorder:   Depressive phase Depression:   Impulsivity Medical Diagnoses and Treatments/Surgeries   Musculoskeletal: Strength & Muscle Tone: within normal limits Gait & Station: normal Patient leans: N/A  Psychiatric Specialty Exam: Physical Exam   Nursing note and vitals reviewed.   Review of Systems  Musculoskeletal: Positive for joint pain.  Psychiatric/Behavioral: Positive for depression and suicidal ideas.  All other systems reviewed and are negative.   Blood pressure (!) 143/92, pulse (!) 42, temperature 98.2 F (36.8 C), temperature source Oral, resp. rate 18, height 5\' 9"  (1.753 m), weight 88 kg (194 lb), SpO2 100 %.Body mass index is 28.65 kg/m.  General Appearance: Casual  Eye Contact:  Good  Speech:  Clear and Coherent  Volume:  Normal  Mood:  Dysphoric  Affect:  Appropriate  Thought Process:  Goal Directed and Descriptions of Associations: Intact  Orientation:  Full (Time, Place, and Person)  Thought Content:  WDL  Suicidal Thoughts:  Yes.  with intent/plan  Homicidal Thoughts:  No  Memory:  Immediate;   Fair Recent;   Fair Remote;   Fair  Judgement:  Poor  Insight:  Shallow  Psychomotor Activity:  Normal  Concentration:  Concentration: Fair and Attention Span: Fair  Recall:  [employer] of Knowledge:  Fair  Language:  Fair  Akathisia:  No  Handed:  Right  AIMS (if indicated):     Assets:  Communication Skills Desire for Improvement Financial Resources/Insurance Resilience Social Support  ADL's:  Intact  Cognition:  WNL  Sleep:  Number of Hours: 7      COGNITIVE FEATURES THAT CONTRIBUTE TO RISK:  None    SUICIDE RISK:   Moderate:  Frequent suicidal ideation with limited intensity, and duration, some specificity in terms of plans, no associated intent, good self-control, limited dysphoria/symptomatology, some risk factors present, and identifiable protective factors, including available and accessible social support.   PLAN OF CARE: Hospital admission, medication management, discharge planning.  [name] is a 50 year old male with history of bipolar disorder admitted for suicidal ideation  the context of severe social stressors.  1. Suicidal ideation. The patient is able to contract for  safety in the hospital.  2. Mood. We will continue Celexa for depression. Abilify was discontinued in the community as it caused EPS.  3. HIV. We'll continue antiretrovirals as prescribed by [facility].  4. Hypertension. He is on lisinopril, ASA, and Norvasc.  5. BPH. He is on Flomax.  6. COPD. He is on inhaler.  7. Dyslipidemia. He is on Lipitor.  8. Arthritis. [facility]. He still has stitches. He is on Percocet.  9. Metabolic syndrome monitoring. Lipid panel, TSH and hemoglobin A1c are pending.  10. EKG. Pending.  11. Constipation. He is on MiraLAX.   12. Substance abuse. The patient has a history of substance use but has been clean for 4 months.   13. Disposition. To be established. The patient is homeless and will not get his money until the first of next month. She will follow up with [facility].  I certify that inpatient services furnished can reasonably be expected to improve the patient's condition.  Kristine LineaJolanta Tereasa Yilmaz, MD 10/26/2016, 9:38 AM

## 2016-10-26 NOTE — Progress Notes (Signed)
Recreation Therapy Notes  INPATIENT RECREATION THERAPY ASSESSMENT  Patient Details Name: Roy [name] [facility] MRN: [id number] DOB: [birth year] Today's Date: 10/26/2016  Patient Stressors: Family, Death, Other (Comment) Not good relationship with family - since mom died things are not the same; mom's death has been stressful; no home; learning how to read  Coping Skills:   Isolate, Avoidance, Exercise, Art/Dance, Music, Sports  Personal Challenges: Communication, Concentration, Decision-Making, Expressing Yourself, Problem-Solving, Relationships, Self-Esteem/Confidence, Social Interaction, Stress Management, Trusting Others  Leisure Interests (2+):  Individual - Other (Comment) (Go to the movies, go to the beach)  Awareness of Community Resources:  Yes  Community Resources:  [employer], Research scientist (physical sciences)  Current Use: No  If no, Barriers?: Other (Comment) (Been homeless)  Patient Strengths:  "Not now"  Patient Identified Areas of Improvement:  Having somehwere to stay and develop relationship with family  Current Recreation Participation:  Can't remember  Patient Goal for Hospitalization:  To get a place to stay and get married  Lane of Residence:  [place] of Residence:  [place]   Current SI (including self-harm):  Yes ("Sometimes")  Current HI:  No  Consent to Intern Participation: N/A   Jacquelynn Cree, LRT/CTRS 10/26/2016, 2:32 PM

## 2016-10-26 NOTE — Progress Notes (Signed)
Patient pleasant and cooperative with care. No negative behaviors. Med and group compliant. Appropriate with staff and peers. Denies HI, AVH.  Passive SI, endorses depression.  Encouragement and support offered. Pt receptive and remains safe on unit with q 15 min checks.
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
## 2016-10-27 DIAGNOSIS — Z79899 Other long term (current) drug therapy: Secondary | ICD-10-CM

## 2016-10-27 DIAGNOSIS — F17210 Nicotine dependence, cigarettes, uncomplicated: Secondary | ICD-10-CM

## 2016-10-27 LAB — HEMOGLOBIN A1C
Hgb A1c MFr Bld: 5.6 % (ref 4.8–5.6)
Mean Plasma Glucose: 114 mg/dL

## 2016-10-27 LAB — CK TOTAL AND CKMB (NOT AT ARMC)
CK, MB: 2.4 ng/mL (ref 0.5–5.0)
Relative Index: 1.9 (ref 0.0–2.5)
Total CK: 127 U/L (ref 49–397)

## 2016-10-27 NOTE — Progress Notes (Signed)
Pt was observed in the dayroom watching television with peers. Alert and oriented x 4, respirations even and unlabored, uses a wheelchair for mobility, no acute distress noted. Pt requested to use Albuterol inhaler before bed. Denied feeling SOB but wanted it to "loosen me up. I use it all of the time when I'm at home." Also denied having any pain or discomfort. Pt reports that he had surgery on 09/18/2016 at [facility] on his right foot due to "arthritis." Denies AVH but does report "I feel suicidal at times. Not all of the time. I'm just tired of people lying to me." Pt was asked to elaborate on his statement but he just kept repeating "i'm just tired of people lying to me." Pt did report feeling anxiety at a level of 8/10 and depression at a level of 6/10. Pt is group and medication compliant. No further complaints. Remains on q15 minute observation rounds for safety. Will continue to monitor.

## 2016-10-27 NOTE — Plan of Care (Signed)
Problem: Safety: Goal: Ability to remain free from injury will improve Outcome: Progressing Pt has remained free from injury.  Problem: Health Behavior/Discharge Planning: Goal: Ability to manage health-related needs will improve Outcome: Progressing Pt will collaborate with staff to manage health-related needs.  Problem: Pain Managment: Goal: General experience of comfort will improve Outcome: Progressing Pt denies having any pain or discomfort.  Problem: Physical Regulation: Goal: Will remain free from infection Outcome: Progressing Pt has remained free from infection.

## 2016-10-27 NOTE — Progress Notes (Signed)
[facility] MD Progress Note  10/27/2016 12:28 PM Roy Joyce Gross Bray Sr.  MRN:  [id number] Subjective:  Patient is a 50 year old male with history of bipolar disorder and who presented with Tom suicidal thoughts due to stressors in his life. His mother died recently 2 months ago and he had a problem with his apartment and had to go to the homeless shelter. He shouldn't recently had surgery on his fourth at [facility] for arthritis and is currently limping since he is not healed fully. Patient continues to report symptoms of depression with the poor sleep and decreased appetite. He has endorsed symptoms of feeling hopeless and having crying spells. Morning patient was seen and chart reviewed. Per nurse patient has been complaining of pain in his left shoulder and some chest pain. On assessment patient looks well and is walking with a slight limp. He denies any shortness of breath. He denies any dizziness. He did say that his been having a sharp pain in his left arm radiating from his shoulder. Denies any previous cardiac history. However patient is positive for HIV and hepatitis C.  Principal Problem: Severe recurrent major depression without psychotic features (HCC) Diagnosis:   Patient Active Problem List   Diagnosis Date Noted  . Tobacco use disorder [F17.200] 10/26/2016  . HIV disease (HCC) [B20] 10/26/2016  . HTN (hypertension) [I10] 10/26/2016  . Dyslipidemia [E78.5] 10/26/2016  . BPH (benign prostatic hyperplasia) [N40.0] 10/26/2016  . Constipation [K59.00] 10/26/2016  . Severe recurrent major depression without psychotic features (HCC) [F33.2] 10/25/2016   Total Time spent with patient: 20 minutes  Past Psychiatric History:   Past Medical History:  Past Medical History:  Diagnosis Date  . Arthritis   . Asthma   . Hepatitis C   . HIV (human immunodeficiency virus infection) (HCC)   . HTN (hypertension)     Past Surgical History:  Procedure Laterality Date  . HERNIA REPAIR    . TOE SURGERY      Family History: History reviewed. No pertinent family history. Family Psychiatric  History:  Social History:  History  Alcohol Use No     History  Drug use: Unknown    Social History   Social History  . Marital status: Single    Spouse name: N/A  . Number of children: N/A  . Years of education: N/A   Social History Main Topics  . Smoking status: Current Every Day Smoker    Types: Cigarettes  . Smokeless tobacco: Never Used  . Alcohol use No  . Drug use: Unknown  . Sexual activity: Yes   Other Topics Concern  . None   Social History Narrative  . None   Additional Social History:                         Sleep: Fair  Appetite:  Fair  Current Medications: Current Facility-Administered Medications  Medication Dose Route Frequency Provider Last Rate Last Dose  . acetaminophen (TYLENOL) tablet 650 mg  650 mg Oral Q6H PRN Audery Amel, MD      . albuterol (PROVENTIL HFA;VENTOLIN HFA) 108 (90 Base) MCG/ACT inhaler 2 puff  2 puff Inhalation Q4H PRN Audery Amel, MD   2 puff at 10/26/16 2040  . alum & mag hydroxide-simeth (MAALOX/MYLANTA) 200-200-20 MG/5ML suspension 30 mL  30 mL Oral Q4H PRN Audery Amel, MD      . amLODipine (NORVASC) tablet 5 mg  5 mg Oral Daily Audery Amel, MD  5 mg at 10/27/16 0813  . aspirin EC tablet 81 mg  81 mg Oral Daily Audery Amel, MD   81 mg at 10/27/16 0813  . atorvastatin (LIPITOR) tablet 40 mg  40 mg Oral q1800 Audery Amel, MD   40 mg at 10/26/16 1643  . citalopram (CELEXA) tablet 20 mg  20 mg Oral Daily Audery Amel, MD   20 mg at 10/27/16 0813  . docusate sodium (COLACE) capsule 100 mg  100 mg Oral BID Audery Amel, MD   100 mg at 10/27/16 0813  . dolutegravir (TIVICAY) tablet 50 mg  50 mg Oral Daily Audery Amel, MD   50 mg at 10/27/16 0813  . emtricitabine-tenofovir AF (DESCOVY) 200-25 MG per tablet 1 tablet  1 tablet Oral Daily Audery Amel, MD   1 tablet at 10/27/16 0849  . feeding supplement (ENSURE  ENLIVE) (ENSURE ENLIVE) liquid 237 mL  237 mL Oral TID BM Jolanta B Pucilowska, MD   237 mL at 10/27/16 0853  . gabapentin (NEURONTIN) capsule 300 mg  300 mg Oral TID Audery Amel, MD   300 mg at 10/27/16 1144  . lisinopril (PRINIVIL,ZESTRIL) tablet 20 mg  20 mg Oral Daily Audery Amel, MD   20 mg at 10/27/16 0813  . magnesium hydroxide (MILK OF MAGNESIA) suspension 30 mL  30 mL Oral Daily PRN Audery Amel, MD      . oxyCODONE (Oxy IR/ROXICODONE) immediate release tablet 5 mg  5 mg Oral Q4H PRN Brandy Hale, MD   5 mg at 10/27/16 1144  . oxymetazoline (AFRIN) 0.05 % nasal spray 2 spray  2 spray Each Nare BID PRN Brandy Hale, MD   2 spray at 10/26/16 0850  . polyethylene glycol (MIRALAX / GLYCOLAX) packet 17 g  17 g Oral Daily Audery Amel, MD   17 g at 10/27/16 0849  . tamsulosin (FLOMAX) capsule 0.4 mg  0.4 mg Oral QPC breakfast Audery Amel, MD   0.4 mg at 10/27/16 1610    Lab Results:  Results for orders placed or performed during the hospital encounter of 10/25/16 (from the past 48 hour(s))  Hemoglobin A1c     Status: None   Collection Time: 10/26/16  6:42 AM  Result Value Ref Range   Hgb A1c MFr Bld 5.6 4.8 - 5.6 %    Comment: (NOTE)         Pre-diabetes: 5.7 - 6.4         Diabetes: >6.4         Glycemic control for adults with diabetes: <7.0    Mean Plasma Glucose 114 mg/dL    Comment: (NOTE) Performed At: [facility] [address] [place], [place] [id number] Mila Homer MD [id number]   Lipid panel     Status: None   Collection Time: 10/26/16  6:42 AM  Result Value Ref Range   Cholesterol 138 0 - 200 mg/dL   Triglycerides 62 <130 mg/dL   HDL 57 >86 mg/dL   Total CHOL/HDL Ratio 2.4 RATIO   VLDL 12 0 - 40 mg/dL   LDL Cholesterol 69 0 - 99 mg/dL    Comment:        Total Cholesterol/HDL:CHD Risk Coronary Heart Disease Risk Table                     Men   Women  1/2 Average Risk   3.4   3.3  Average Risk  5.0   4.4  2 X Average Risk   9.6    7.1  3 X Average Risk  23.4   11.0        Use the calculated Patient Ratio above and the CHD Risk Table to determine the patient's CHD Risk.        ATP III CLASSIFICATION (LDL):  <100     mg/dL   Optimal  161-096  mg/dL   Near or Above                    Optimal  130-159  mg/dL   Borderline  045-409  mg/dL   High  >811     mg/dL   Very High   TSH     Status: None   Collection Time: 10/26/16  6:42 AM  Result Value Ref Range   TSH 2.372 0.350 - 4.500 uIU/mL    Comment: Performed by a 3rd Generation assay with a functional sensitivity of <=0.01 uIU/mL.  Urine Drug Screen, Qualitative     Status: None   Collection Time: 10/26/16  7:05 AM  Result Value Ref Range   Tricyclic, Ur Screen NONE DETECTED NONE DETECTED   Amphetamines, Ur Screen NONE DETECTED NONE DETECTED   MDMA (Ecstasy)Ur Screen NONE DETECTED NONE DETECTED   Cocaine Metabolite,Ur [place] NONE DETECTED NONE DETECTED   Opiate, Ur Screen NONE DETECTED NONE DETECTED   Phencyclidine (PCP) Ur S NONE DETECTED NONE DETECTED   Cannabinoid 50 Ng, Ur [place] NONE DETECTED NONE DETECTED   Barbiturates, Ur Screen NONE DETECTED NONE DETECTED   Benzodiazepine, Ur Scrn NONE DETECTED NONE DETECTED   Methadone Scn, Ur NONE DETECTED NONE DETECTED    Comment: (NOTE) 100  Tricyclics, urine               Cutoff 1000 ng/mL 200  Amphetamines, urine             Cutoff 1000 ng/mL 300  MDMA (Ecstasy), urine           Cutoff 500 ng/mL 400  Cocaine Metabolite, urine       Cutoff 300 ng/mL 500  Opiate, urine                   Cutoff 300 ng/mL 600  Phencyclidine (PCP), urine      Cutoff 25 ng/mL 700  Cannabinoid, urine              Cutoff 50 ng/mL 800  Barbiturates, urine             Cutoff 200 ng/mL 900  Benzodiazepine, urine           Cutoff 200 ng/mL 1000 Methadone, urine                Cutoff 300 ng/mL 1100 1200 The urine drug screen provides only a preliminary, unconfirmed 1300 analytical test result and should not be used for non-medical 1400  purposes. Clinical consideration and professional judgment should 1500 be applied to any positive drug screen result due to possible 1600 interfering substances. A more specific alternate chemical method 1700 must be used in order to obtain a confirmed analytical result.  1800 Gas chromato graphy / mass spectrometry (GC/MS) is the preferred 1900 confirmatory method.     Blood Alcohol level:  Lab Results  Component Value Date   ETH <5 10/24/2016   ETH 12 (H) 11/08/2015    Metabolic Disorder Labs: Lab Results  Component Value Date   HGBA1C  5.6 10/26/2016   MPG 114 10/26/2016   No results found for: PROLACTIN Lab Results  Component Value Date   CHOL 138 10/26/2016   TRIG 62 10/26/2016   HDL 57 10/26/2016   CHOLHDL 2.4 10/26/2016   VLDL 12 10/26/2016   LDLCALC 69 10/26/2016    Physical Findings: AIMS:  , ,  ,  ,    CIWA:    COWS:     Musculoskeletal: Strength & Muscle Tone: within normal limits Gait & Station: Patient currently limping due to surgery on his foot Patient leans: Right  Psychiatric Specialty Exam: Physical Exam  ROS  Blood pressure (!) 148/90, pulse 77, temperature 98.6 F (37 C), temperature source Oral, resp. rate 19, height 5\' 9"  (1.753 m), weight 194 lb (88 kg), SpO2 100 %.Body mass index is 28.65 kg/m.  General Appearance: Casual  Eye Contact:  Fair  Speech:  Clear and Coherent  Volume:  Normal  Mood:  Anxious and Dysphoric  Affect:  Congruent  Thought Process:  Coherent  Orientation:  Full (Time, Place, and Person)  Thought Content:  WDL  Suicidal Thoughts:  No  Homicidal Thoughts:  No  Memory:  Immediate;   Fair Recent;   Fair Remote;   Fair  Judgement:  Impaired  Insight:  Shallow  Psychomotor Activity:  Normal  Concentration:  Concentration: Fair and Attention Span: Fair  Recall:  [employer] of Knowledge:  Fair  Language:  Fair  Akathisia:  No  Handed:  Right  AIMS (if indicated):     Assets:  Communication Skills  ADL's:   Intact  Cognition:  WNL  Sleep:  Number of Hours: 7.15     Treatment Plan Summary: Daily contact with patient to assess and evaluate symptoms and progress in treatment and Medication management   Mr. Mudgett is a 50 year old male with history of bipolar disorder admitted for suicidal ideation the context of severe social stressors.  1. Suicidal ideation. The patient is able to contract for safety in the hospital.  2. Mood. We will continue Celexa for depression.   3. HIV. We'll continue antiretrovirals as prescribed by [facility]  4. Hypertension. He is on lisinopril, ASA, and Norvasc.  5. BPH. He is on Flomax.  6. COPD. He is on inhaler.  7. Dyslipidemia. He is on Lipitor.  8. Arthritis. He had foot surgery recently at [facility]. He still has stitches. He is on Percocet.  9. Metabolic syndrome monitoring. Lipid panel Is normal TSH and hemoglobin A1c is normal  10. EKG. Pending.  11. Constipation. He is on MiraLAX.   12. Substance abuse. The patient has a history of substance use but has been clean for 4 months.  Blood alcohol level was detected at 12  13. Insomnia. Trazodone is available.  14. Chest pain- to be monitored, EKG was ordered for today along with cardiac enzymes  15. Disposition. To be established. The patient is homeless and will not get his money until the first of next month. She will follow up with [facility].    Physician Treatment Plan for Primary Diagnosis: Severe recurrent major depression without psychotic features (HCC) Long Term Goal(s): Improvement in symptoms so as ready for discharge  Short Term Goals: Ability to identify changes in lifestyle to reduce recurrence of condition will improve, Ability to verbalize feelings will improve, Ability to disclose and discuss suicidal ideas, Ability to demonstrate self-control will improve, Ability to identify and develop effective coping behaviors will improve, Ability to maintain  clinical measurements within normal limits will improve and Compliance with prescribed medications will improve  Physician Treatment Plan for Secondary Diagnosis: Principal Problem:   Severe recurrent major depression without psychotic features (HCC) Active Problems:   Tobacco use disorder   HIV disease (HCC)   HTN (hypertension)   Dyslipidemia   BPH (benign prostatic hyperplasia)   Constipation    Verlaine Embry, MD 10/27/2016, 12:28 PM

## 2016-10-27 NOTE — [facility] Group Notes (Signed)
[facility] LCSW Group Therapy  10/27/2016 3:25 PM  Type of Therapy:  Group Therapy  Participation Level:  Patient did not attend group. CSW invited patient to group.   Summary of Progress/Problems:Self esteem: Patients discussed self esteem and how it impacts them. They discussed what aspects in their lives has influenced their self esteem. They were challenged to identify changes that are needed in order to improve self esteem. Patients participated in activity where they had to identify positive adjectives they felt described their personality. Patients shared with the group on the following areas: Things I am good at, What I like about my appearance, I've helped others by, What I value the most, compliments I have received, challenges I have overcome, thing that make me unique, and Times I've made others happy.    Jeoffrey Eleazer G. Garnette Czech MSW, LCSWA 10/27/2016, 3:26 PM

## 2016-10-27 NOTE — Progress Notes (Addendum)
Patient with appropriate affect, cooperative behavior with meals, meds and plan of care. No SI/HI at this time. Patient c/o discomfort to shoulder, back and chest. No s/s of distress. Skin warm and dry. VS monitored and recorded. MD into assess and considers orders. Patient with dressing to surgical site on foot that is clean, dry, intact. Patient verbalizes understanding to use wheelchair with leg elevated.Clergy aware patient is verbalizes grief process rt his "Mother passed two months ago". Safety maintained. Attend therapy group is daily goal.

## 2016-10-28 MED ORDER — TRAZODONE HCL 50 MG PO TABS
50.0000 mg | ORAL_TABLET | Freq: Once | ORAL | Status: AC
  Administered 2016-10-28: 50 mg via ORAL
  Filled 2016-10-28: qty 1

## 2016-10-28 NOTE — Progress Notes (Signed)
[facility] MD Progress Note  10/28/2016 1:20 PM Roy Joyce Gross Greenblatt Sr.  MRN:  [id number] Subjective:  Patient is a 50 year old male with history of bipolar disorder and who presented with Tom suicidal thoughts due to stressors in his life. His mother died recently 2 months ago and he had a problem with his apartment and had to go to the homeless shelter. He shouldn't recently had surgery on his fourth at [facility] for arthritis and is currently limping since he is not healed fully. Patient continues to report symptoms of depression with the poor sleep and decreased appetite. He has endorsed symptoms of feeling hopeless and having crying spells. Patient was seen and chart reviewed. Patient has been complaining of pain in his left shoulder , looks comfortable with his foot propped up and watching TV. Patient informed his cardiac enzymes are normal. Reports mood as okay, denies any suicidal thoughts. Denies any previous cardiac history. However patient is positive for HIV and hepatitis C.  Principal Problem: Severe recurrent major depression without psychotic features (HCC) Diagnosis:   Patient Active Problem List   Diagnosis Date Noted  . Tobacco use disorder [F17.200] 10/26/2016  . HIV disease (HCC) [B20] 10/26/2016  . HTN (hypertension) [I10] 10/26/2016  . Dyslipidemia [E78.5] 10/26/2016  . BPH (benign prostatic hyperplasia) [N40.0] 10/26/2016  . Constipation [K59.00] 10/26/2016  . Severe recurrent major depression without psychotic features (HCC) [F33.2] 10/25/2016   Total Time spent with patient: 20 minutes  Past Psychiatric History:   Past Medical History:  Past Medical History:  Diagnosis Date  . Arthritis   . Asthma   . Hepatitis C   . HIV (human immunodeficiency virus infection) (HCC)   . HTN (hypertension)     Past Surgical History:  Procedure Laterality Date  . HERNIA REPAIR    . TOE SURGERY     Family History: History reviewed. No pertinent family history. Family Psychiatric   History:  Social History:  History  Alcohol Use No     History  Drug use: Unknown    Social History   Social History  . Marital status: Single    Spouse name: N/A  . Number of children: N/A  . Years of education: N/A   Social History Main Topics  . Smoking status: Current Every Day Smoker    Types: Cigarettes  . Smokeless tobacco: Never Used  . Alcohol use No  . Drug use: Unknown  . Sexual activity: Yes   Other Topics Concern  . None   Social History Narrative  . None   Additional Social History:                         Sleep: Fair  Appetite:  Fair  Current Medications: Current Facility-Administered Medications  Medication Dose Route Frequency Provider Last Rate Last Dose  . acetaminophen (TYLENOL) tablet 650 mg  650 mg Oral Q6H PRN Audery Amel, MD      . albuterol (PROVENTIL HFA;VENTOLIN HFA) 108 (90 Base) MCG/ACT inhaler 2 puff  2 puff Inhalation Q4H PRN Audery Amel, MD   2 puff at 10/28/16 0855  . alum & mag hydroxide-simeth (MAALOX/MYLANTA) 200-200-20 MG/5ML suspension 30 mL  30 mL Oral Q4H PRN Audery Amel, MD      . amLODipine (NORVASC) tablet 5 mg  5 mg Oral Daily Audery Amel, MD   5 mg at 10/28/16 0854  . aspirin EC tablet 81 mg  81 mg Oral Daily John T  Clapacs, MD   81 mg at 10/28/16 0854  . atorvastatin (LIPITOR) tablet 40 mg  40 mg Oral q1800 Audery AmelJohn T Clapacs, MD   40 mg at 10/27/16 1616  . citalopram (CELEXA) tablet 20 mg  20 mg Oral Daily Audery AmelJohn T Clapacs, MD   20 mg at 10/28/16 0854  . docusate sodium (COLACE) capsule 100 mg  100 mg Oral BID Audery AmelJohn T Clapacs, MD   100 mg at 10/28/16 0854  . dolutegravir (TIVICAY) tablet 50 mg  50 mg Oral Daily Audery AmelJohn T Clapacs, MD   50 mg at 10/28/16 0854  . emtricitabine-tenofovir AF (DESCOVY) 200-25 MG per tablet 1 tablet  1 tablet Oral Daily Audery AmelJohn T Clapacs, MD   1 tablet at 10/28/16 0855  . feeding supplement (ENSURE ENLIVE) (ENSURE ENLIVE) liquid 237 mL  237 mL Oral TID BM Jolanta B Pucilowska, MD   237  mL at 10/28/16 1142  . gabapentin (NEURONTIN) capsule 300 mg  300 mg Oral TID Audery AmelJohn T Clapacs, MD   300 mg at 10/28/16 1142  . lisinopril (PRINIVIL,ZESTRIL) tablet 20 mg  20 mg Oral Daily Audery AmelJohn T Clapacs, MD   20 mg at 10/28/16 0854  . magnesium hydroxide (MILK OF MAGNESIA) suspension 30 mL  30 mL Oral Daily PRN Audery AmelJohn T Clapacs, MD      . oxyCODONE (Oxy IR/ROXICODONE) immediate release tablet 5 mg  5 mg Oral Q4H PRN Brandy HaleUzma Faheem, MD   5 mg at 10/28/16 0856  . oxymetazoline (AFRIN) 0.05 % nasal spray 2 spray  2 spray Each Nare BID PRN Brandy HaleUzma Faheem, MD   2 spray at 10/28/16 0855  . polyethylene glycol (MIRALAX / GLYCOLAX) packet 17 g  17 g Oral Daily Audery AmelJohn T Clapacs, MD   17 g at 10/28/16 0854  . tamsulosin (FLOMAX) capsule 0.4 mg  0.4 mg Oral QPC breakfast Audery AmelJohn T Clapacs, MD   0.4 mg at 10/28/16 [id number]    Lab Results:  Results for orders placed or performed during the hospital encounter of 10/25/16 (from the past 48 hour(s))  CK total and CKMB (cardiac)not [facility]     Status: None   Collection Time: 10/27/16  1:58 PM  Result Value Ref Range   Total CK 127 49 - 397 U/L   CK, MB 2.4 0.5 - 5.0 ng/mL   Relative Index 1.9 0.0 - 2.5    Blood Alcohol level:  Lab Results  Component Value Date   ETH <5 10/24/2016   ETH 12 (H) 11/08/2015    Metabolic Disorder Labs: Lab Results  Component Value Date   HGBA1C 5.6 10/26/2016   MPG 114 10/26/2016   No results found for: PROLACTIN Lab Results  Component Value Date   CHOL 138 10/26/2016   TRIG 62 10/26/2016   HDL 57 10/26/2016   CHOLHDL 2.4 10/26/2016   VLDL 12 10/26/2016   LDLCALC 69 10/26/2016    Physical Findings: AIMS:  , ,  ,  ,    CIWA:    COWS:     Musculoskeletal: Strength & Muscle Tone: within normal limits Gait & Station: Patient currently limping due to surgery on his foot Patient leans: Right  Psychiatric Specialty Exam: Physical Exam  ROS  Blood pressure (!) 153/94, pulse (!) 50, temperature 98.7 F (37.1 C), resp. rate  19, height 5\' 9"  (1.753 m), weight 194 lb (88 kg), SpO2 100 %.Body mass index is 28.65 kg/m.  General Appearance: Casual  Eye Contact:  Fair  Speech:  Clear  and Coherent  Volume:  Normal  Mood:  Anxious and Dysphoric  Affect:  Congruent  Thought Process:  Coherent  Orientation:  Full (Time, Place, and Person)  Thought Content:  WDL  Suicidal Thoughts:  No  Homicidal Thoughts:  No  Memory:  Immediate;   Fair Recent;   Fair Remote;   Fair  Judgement:  Impaired  Insight:  Shallow  Psychomotor Activity:  Normal  Concentration:  Concentration: Fair and Attention Span: Fair  Recall:  [employer] of Knowledge:  Fair  Language:  Fair  Akathisia:  No  Handed:  Right  AIMS (if indicated):     Assets:  Communication Skills  ADL's:  Intact  Cognition:  WNL  Sleep:  Number of Hours: 7.15     Treatment Plan Summary: Daily contact with patient to assess and evaluate symptoms and progress in treatment and Medication management   Mr. Picardo is a 50 year old male with history of bipolar disorder admitted for suicidal ideation the context of severe social stressors.  1. Suicidal ideation. The patient is able to contract for safety in the hospital.  2. Mood. We will continue Celexa for depression.   3. HIV. We'll continue antiretrovirals as prescribed by [facility].  4. Hypertension. He is on lisinopril, ASA, and Norvasc.  5. BPH. He is on Flomax.  6. COPD. He is on inhaler.  7. Dyslipidemia. He is on Lipitor.  8. Arthritis. He had foot surgery recently at [facility]. He still has stitches. He is on Percocet.  9. Metabolic syndrome monitoring. Lipid panel Is normal TSH and hemoglobin A1c is normal  10. EKG. Pending.  11. Constipation. He is on MiraLAX.   12. Substance abuse. The patient has a history of substance use but has been clean for 4 months.  Blood alcohol level was detected at 12  13. Insomnia. Trazodone is available.  14. Chest pain- to be monitored,  EKG was ordered for today along with cardiac enzymes  15. Disposition. To be established. The patient is homeless and will not get his money until the first of next month. He will follow up with [facility].    Physician Treatment Plan for Primary Diagnosis: Severe recurrent major depression without psychotic features (HCC) Long Term Goal(s): Improvement in symptoms so as ready for discharge  Short Term Goals: Ability to identify changes in lifestyle to reduce recurrence of condition will improve, Ability to verbalize feelings will improve, Ability to disclose and discuss suicidal ideas, Ability to demonstrate self-control will improve, Ability to identify and develop effective coping behaviors will improve, Ability to maintain clinical measurements within normal limits will improve and Compliance with prescribed medications will improve  Physician Treatment Plan for Secondary Diagnosis: Principal Problem:   Severe recurrent major depression without psychotic features (HCC) Active Problems:   Tobacco use disorder   HIV disease (HCC)   HTN (hypertension)   Dyslipidemia   BPH (benign prostatic hyperplasia)   Constipation  No change in medication management 10/28/2016.   Patrick North, MD 10/28/2016, 1:20 PM

## 2016-10-28 NOTE — Plan of Care (Signed)
Problem: Safety: Goal: Ability to remain free from injury will improve Outcome: Progressing Pt has remained free from injury.   Problem: Pain Managment: Goal: General experience of comfort will improve Outcome: Progressing Pts reports that he is "not as bad as he was at first." Overall comfort has improved.  Problem: Physical Regulation: Goal: Will remain free from infection Outcome: Progressing Pt has remained free form infection.  Problem: Nutrition: Goal: Adequate nutrition will be maintained Outcome: Progressing Pt has maintained adequate nutrition. Receives Ensure supplement in addition to meals and snacks.

## 2016-10-28 NOTE — [facility] Group Notes (Signed)
[facility] LCSW Group Therapy  10/28/2016 3:17 PM  Type of Therapy:  Group Therapy  Participation Level:  Patient did not attend group. CSW invited patient to group.   Summary of Progress/Problems:Coping Skills: Patients defined and discussed healthy coping skills. Patients identified healthy coping skills they would like to try during hospitalization and after discharge. CSW offered insight to varying coping skills that may have been new to patients such as practicing mindfulness.  Roy [name] G. Garnette Czech MSW, LCSWA 10/28/2016, 3:18 PM

## 2016-10-28 NOTE — Plan of Care (Signed)
Problem: Safety: Goal: Ability to remain free from injury will improve Outcome: Progressing Pt has remained free from injury.  Problem: Pain Managment: Goal: General experience of comfort will improve Outcome: Progressing Today, pt has expressed that he is happy. Has an upcoming wedding to look forward to.  Problem: Tissue Perfusion: Goal: Risk factors for ineffective tissue perfusion will decrease Outcome: Progressing Incision on right foot appears to be healing well and free of infection.  Problem: Nutrition: Goal: Adequate nutrition will be maintained Outcome: Progressing Pt reports eating all meals. Also receives a supplement, Ensure Enlive.

## 2016-10-28 NOTE — Progress Notes (Signed)
Pt alert and oriented x 4, respirations even and unlabored, uses a wheelchair for mobility, no acute distress noted. Denies HI/AVH, but does report feeling depressed and anxious on a level of 4/10. Also reports feeling suicidal "at times, but not right now." Pt states "i'm tired of being lied to." Complained of nose bleeding and feeling dizzy. Vital signs checked and were normal. Pt reported "no, I don't drink much water at all. My nose feels dry." Pt was encouraged to drink more water and was also given a Gatorade to drink. About thirty minutes later pt reported "I feel better now. I'm drinking the Gatorade and I'm ok. I'll try to drink more water too." No further complaints. Pt reports that the dressing on his right foot was changed this morning and the sutures are to be removed soon. Remains on q15 minute observation rounds for safety. Will continue to monitor.

## 2016-10-28 NOTE — Progress Notes (Signed)
Patient with appropriate affect, cooperative behavior with meals, meds and plan of care. No SI/HI at this time. Dressing change with no s/s of infection. Patient states "stiches are supposed to be removed this week". Uses prn pain med for shoulder discomfort with good effect. No distress. Patient states he [facility] a girl at [facility] and will marry end of next summer.

## 2016-10-29 MED ORDER — LISINOPRIL 20 MG PO TABS
20.0000 mg | ORAL_TABLET | Freq: Every day | ORAL | 0 refills | Status: DC
Start: 2016-10-29 — End: 2017-02-26

## 2016-10-29 MED ORDER — AMLODIPINE BESYLATE 5 MG PO TABS: 5.0000 mg | ORAL_TABLET | Freq: Every day | ORAL | 0 refills | Status: DC

## 2016-10-29 MED ORDER — TAMSULOSIN HCL 0.4 MG PO CAPS: 0.4000 mg | ORAL_CAPSULE | Freq: Every day | ORAL | 0 refills | Status: DC

## 2016-10-29 MED ORDER — EMTRICITABINE-TENOFOVIR AF 200-25 MG PO TABS
1.0000 | ORAL_TABLET | Freq: Every day | ORAL | 0 refills | Status: DC
Start: 2016-10-29 — End: 2017-02-26

## 2016-10-29 MED ORDER — POLYETHYLENE GLYCOL 3350 17 G PO PACK: 17.0000 g | PACK | Freq: Every day | ORAL | 0 refills | Status: DC

## 2016-10-29 MED ORDER — ATORVASTATIN CALCIUM 40 MG PO TABS
40.0000 mg | ORAL_TABLET | Freq: Every day | ORAL | 0 refills | Status: DC
Start: 2016-10-29 — End: 2017-02-26

## 2016-10-29 MED ORDER — ALBUTEROL SULFATE HFA 108 (90 BASE) MCG/ACT IN AERS: 2.0000 | INHALATION_SPRAY | RESPIRATORY_TRACT | 0 refills | Status: DC | PRN

## 2016-10-29 MED ORDER — DOLUTEGRAVIR SODIUM 50 MG PO TABS
50.0000 mg | ORAL_TABLET | Freq: Every day | ORAL | 0 refills | Status: DC
Start: 2016-10-29 — End: 2017-02-26

## 2016-10-29 MED ORDER — GABAPENTIN 300 MG PO CAPS: 300.0000 mg | ORAL_CAPSULE | Freq: Three times a day (TID) | ORAL | 0 refills | Status: DC

## 2016-10-29 MED ORDER — OXYCODONE HCL 5 MG PO TABS: 5.0000 mg | ORAL_TABLET | Freq: Four times a day (QID) | ORAL | Status: DC | PRN

## 2016-10-29 MED ORDER — ASPIRIN EC 81 MG PO TBEC
81.0000 mg | DELAYED_RELEASE_TABLET | Freq: Every day | ORAL | 0 refills | Status: DC
Start: 2016-10-29 — End: 2017-02-26

## 2016-10-29 MED ORDER — CITALOPRAM HYDROBROMIDE 20 MG PO TABS: 20.0000 mg | ORAL_TABLET | Freq: Every day | ORAL | 0 refills | Status: DC

## 2016-10-29 MED ORDER — DOCUSATE SODIUM 100 MG PO CAPS: 100.0000 mg | ORAL_CAPSULE | Freq: Two times a day (BID) | ORAL | 0 refills | Status: DC

## 2016-10-29 NOTE — Progress Notes (Signed)
Recreation Therapy Notes  Date: 11.13.17 Time: 1:00 pm Location: Craft Room  Group Topic: Wellness  Goal Area(s) Addresses:  Patient will identify at least one item per dimension of health. Patient will examine areas they are deficient in.  Behavioral Response: Attentive, Interactive  Intervention: 6 Dimensions of Health  Activity: Patients were given a definition sheet with the 6 Dimensions of Health and a worksheet with each dimension listed. Patients were instructed to write at least one item in each dimension that they were doing before they came to the hospital. LRT encouraged patient to write 2-3 items.  Education: LRT educated patients on ways to improve each dimension.  Education Outcome: Acknowledges education/In group clarification offered   Clinical Observations/Feedback: Patient wrote at least one item in each dimension with assistance from LRT. Patient contributed to group discussion by stating what areas he was not giving enough attention to, and what he can do to improve certain emotions.  Jacquelynn Cree, LRT/CTRS 10/29/2016 2:16 PM

## 2016-10-29 NOTE — Progress Notes (Signed)
Pt is alert and oriented x 4, respirations even and unlabored, uses a wheelchair to assist with mobility, no acute distress noted. Denies SI/HI/AVH, anxiety and depression. With a huge smile on his face pt stated "i'm getting married when I leave here. My fiance, she gets 2 checks, and I get 1 check, and I think we can make it. She's a Engineer, site too. I'm happy about that." Pt did c/o having insomnia last night stating "I had a hard time getting to sleep last night. I woke up off and on. Do I have anything ordered for sleep?" On call was notified and ordered a one-time dose of Trazodone 50 mg by mouth. Also requested to use his inhaler and nasal spray because "it's so dry in here. This helps me a lot." Has surgical wound to right foot. No s/s of infection noted. Pt was encouraged to keep this extremity elevated while sitting. No further complaints. Remains on q15 minute observation checks for safety. Will continue to monitor.
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
## 2016-10-29 NOTE — Progress Notes (Signed)
  [facility] Adult Case Management Discharge Plan :  Will you be returning to the same living situation after discharge:  No, pt will be discharging to [place] to live with his aunt. At discharge, do you have transportation home?: Yes,  pt will be provided with a bus pass at discharge Do you have the ability to pay for your medications: Yes,  pt will be provided with prescriptions at discharge  Release of information consent forms completed and in the chart;  [name] signature needed at discharge.  Patient to Follow up at: Follow-up Information    [facility] Follow up.   Why:  Please arrive to the walk-in clinic between the hours of 8am-2:30pm for an assessment for medication management, substance abuse treatment and therapy.  Please call Unk Pinto at [phone] for questions and assista Contact information: [facility] [address] [place] [address] Ph: [phone] Fax: [phone]          Next level of care provider has access to [facility] Link:no  Safety Planning and Suicide Prevention discussed: No.  Pt refused SPE from then CSW.   Have you used any form of tobacco in the last 30 days? (Cigarettes, Smokeless Tobacco, Cigars, and/or Pipes): No  Has patient been referred to the Quitline?: N/A patient is not a smoker  Patient has been referred for addiction treatment: Yes  Dorothe Pea Nakyla Bracco 10/29/2016, 2:59 PM

## 2016-10-29 NOTE — BHH Suicide Risk Assessment (Signed)
[facility] Discharge Suicide Risk Assessment   Principal Problem: Severe recurrent major depression without psychotic features [facility]) Discharge Diagnoses:  Patient Active Problem List   Diagnosis Date Noted  . Tobacco use disorder [F17.200] 10/26/2016  . HIV disease (HCC) [B20] 10/26/2016  . HTN (hypertension) [I10] 10/26/2016  . Dyslipidemia [E78.5] 10/26/2016  . BPH (benign prostatic hyperplasia) [N40.0] 10/26/2016  . Constipation [K59.00] 10/26/2016  . Severe recurrent major depression without psychotic features (HCC) [F33.2] 10/25/2016    Total Time spent with patient: 30 minutes  Musculoskeletal: Strength & Muscle Tone: within normal limits Gait & Station: normal Patient leans: N/A  Psychiatric Specialty Exam: Review of Systems  Musculoskeletal: Positive for joint pain.  All other systems reviewed and are negative.   Blood pressure (!) 153/94, pulse (!) 50, temperature 98.7 F (37.1 C), resp. rate 19, height 5\' 9"  (1.753 m), weight 88 kg (194 lb), SpO2 100 %.Body mass index is 28.65 kg/m.  General Appearance: Casual  Eye Contact::  Good  Speech:  Clear and Coherent409  Volume:  Normal  Mood:  Euthymic  Affect:  Appropriate  Thought Process:  Goal Directed  Orientation:  Full (Time, Place, and Person)  Thought Content:  WDL  Suicidal Thoughts:  No  Homicidal Thoughts:  No  Memory:  Immediate;   Fair Recent;   Fair Remote;   Fair  Judgement:  Impaired  Insight:  Shallow  Psychomotor Activity:  Normal  Concentration:  Fair  Recall:  [employer] of Knowledge:Fair  Language: Fair  Akathisia:  No  Handed:  Right  AIMS (if indicated):     Assets:  Communication Skills Desire for Improvement Financial Resources/Insurance Resilience Social Support  Sleep:  Number of Hours: 7.15  Cognition: WNL  ADL's:  Intact   Mental Status Per Nursing Assessment::   On Admission:  Suicidal ideation indicated by patient, Self-harm thoughts, Intention to act on suicide plan,  Suicide plan  Demographic Factors:  Male and Low socioeconomic status  Loss Factors: Loss of significant relationship, Decline in physical health and Financial problems/change in socioeconomic status  Historical Factors: Prior suicide attempts, Family history of mental illness or substance abuse and Impulsivity  Risk Reduction Factors:   Sense of responsibility to family and Positive therapeutic relationship  Continued Clinical Symptoms:  Depression:   Impulsivity Medical Diagnoses and Treatments/Surgeries  Cognitive Features That Contribute To Risk:  None    Suicide Risk:  Minimal: No identifiable suicidal ideation.  Patients presenting with no risk factors but with morbid ruminations; may be classified as minimal risk based on the severity of the depressive symptoms    Plan Of Care/Follow-up recommendations:  Activity:  as tolerated. Diet:  low sodium heart healthy. Other:  keep follow up appointments.  Kristine Linea, MD 10/29/2016, 5:39 AM

## 2016-10-29 NOTE — BHH Group Notes (Signed)
BHH Group Notes:  (Nursing/MHT/Case Management/Adjunct)  Date:  10/29/2016  Time:  12:55 AM  Type of Therapy:  Psychoeducational Skills  Participation Level:  Active  Participation Quality:  Appropriate, Attentive and Sharing  Affect:  Appropriate  Cognitive:  Appropriate  Insight:  Appropriate, Good and Improving  Engagement in Group:  Engaged  Modes of Intervention:  Discussion, Socialization and Support  Summary of Progress/Problems:  Roy [name] 10/29/2016, 12:55 AM

## 2016-10-29 NOTE — Plan of Care (Signed)
Problem: BHH Participation in Recreation Therapeutic Interventions Goal: STG-Patient will demonstrate improved self esteem by identif STG: Self-Esteem - Within 3 treatment sessions, patient will verbalize at least 5 positive affirmation statements in one treatment session to increase self-esteem post d/c.  Outcome: Completed/Met Date Met: 10/29/16 Treatment Session 1; Completed 1 out of 1: At approximately 11:25 am, LRT met with patient in community room. Patient verbalized 5 positive affirmation statements. Patient reported it felt "good". LRT encouraged patient to continue saying positive affirmation statements.   M , LRT/CTRS 11.13.17 11:44 am Goal: STG-Other Recreation Therapy Goal (Specify) STG: Stress Management - Within 3 treatment sessions, patient will verbalize understanding of the stress management techniques in one treatment session to increase stress management skills post d/c.  Outcome: Completed/Met Date Met: 10/29/16 Treatment Session 1; Completed 1 out of 1: At approximately 11:25 am, LRT met with patient in community room. LRT educated and provided patient with handouts on stress management techniques. Patient verbalized understanding. LRT encouraged patient to practice the stress management techniques.   M , LRT/CTRS 11.13.17 11:45 am   

## 2016-10-29 NOTE — Tx Team (Signed)
Pt was admitted after the previous tx team mtg that the CSW was present for (on11/9/17) and before the next scheduled tx meeting (on 10/30/16) and as such, a tx team note was not needed.    Walid Haig F. Kroy Sprung, LCSWA, LCAS  10/29/16  

## 2016-10-29 NOTE — BHH Suicide Risk Assessment (Signed)
BHH INPATIENT:  Family/Significant Other Suicide Prevention Education  Suicide Prevention Education:  Patient Refusal for Family/Significant Other Suicide Prevention Education: The patient Roy Illingworth Sr. has refused to provide written consent for family/significant other to be provided Family/Significant Other Suicide Prevention Education during admission and/or prior to discharge.  Physician notified.   Pt refused SPE from then CSW.  Roy [name] 10/29/2016, 2:56 PM

## 2016-10-29 NOTE — BHH Suicide Risk Assessment (Signed)
[facility] Discharge Suicide Risk Assessment   Principal Problem: Severe recurrent major depression without psychotic features [facility]) Discharge Diagnoses:  Patient Active Problem List   Diagnosis Date Noted  . Tobacco use disorder [F17.200] 10/26/2016  . HIV disease (HCC) [B20] 10/26/2016  . HTN (hypertension) [I10] 10/26/2016  . Dyslipidemia [E78.5] 10/26/2016  . BPH (benign prostatic hyperplasia) [N40.0] 10/26/2016  . Constipation [K59.00] 10/26/2016  . Severe recurrent major depression without psychotic features (HCC) [F33.2] 10/25/2016    Total Time spent with patient: 30 minutes  Musculoskeletal: Strength & Muscle Tone: within normal limits Gait & Station: normal Patient leans: N/A  Psychiatric Specialty Exam: Review of Systems  Musculoskeletal: Positive for joint pain.  All other systems reviewed and are negative.   Blood pressure 121/68, pulse (!) 49, temperature 98.4 F (36.9 C), temperature source Oral, resp. rate 18, height 5\' 9"  (1.753 m), weight 88 kg (194 lb), SpO2 100 %.Body mass index is 28.65 kg/m.  General Appearance: Casual  Eye Contact::  Good  Speech:  Clear and Coherent409  Volume:  Normal  Mood:  Euthymic  Affect:  Appropriate  Thought Process:  Goal Directed  Orientation:  Full (Time, Place, and Person)  Thought Content:  WDL  Suicidal Thoughts:  No  Homicidal Thoughts:  No  Memory:  Immediate;   Fair Recent;   Fair Remote;   Fair  Judgement:  Impaired  Insight:  Shallow  Psychomotor Activity:  Normal  Concentration:  Fair  Recall:  [employer] of Knowledge:Fair  Language: Fair  Akathisia:  No  Handed:  Right  AIMS (if indicated):     Assets:  Communication Skills Desire for Improvement Financial Resources/Insurance Resilience Social Support  Sleep:  Number of Hours: 7.15  Cognition: WNL  ADL's:  Intact   Mental Status Per Nursing Assessment::   On Admission:  Suicidal ideation indicated by patient, Self-harm thoughts, Intention to act on  suicide plan, Suicide plan  Demographic Factors:  Male and Low socioeconomic status  Loss Factors: Loss of significant relationship, Decline in physical health and Financial problems/change in socioeconomic status  Historical Factors: Prior suicide attempts, Family history of mental illness or substance abuse and Impulsivity  Risk Reduction Factors:   Sense of responsibility to family and Positive therapeutic relationship  Continued Clinical Symptoms:  Depression:   Impulsivity Medical Diagnoses and Treatments/Surgeries  Cognitive Features That Contribute To Risk:  None    Suicide Risk:  Minimal: No identifiable suicidal ideation.  Patients presenting with no risk factors but with morbid ruminations; may be classified as minimal risk based on the severity of the depressive symptoms    Plan Of Care/Follow-up recommendations:  Activity:  as tolerated. Diet:  low sodium heart healthy. Other:  keep follow up appointments.  Kristine Linea, MD 10/29/2016, 12:04 PM

## 2016-10-30 NOTE — Progress Notes (Signed)
Recreation Therapy Notes  INPATIENT RECREATION TR PLAN  Patient Details Name: Roy Markuson [facility] Sr. MRN: [id number] DOB: [birth year] Today's Date: 10/30/2016  Rec Therapy Plan Is patient appropriate for Therapeutic Recreation?: Yes Treatment times per week: At least once a week TR Treatment/Interventions: 1:1 session, Group participation (Comment) (Appropriate participation in daily recreational therapy tx)  Discharge Criteria Pt will be discharged from therapy if:: Treatment goals are met, Discharged Treatment plan/goals/alternatives discussed and agreed upon by:: Patient/family  Discharge Summary Short term goals set: See Care Plan Short term goals met: Complete Progress toward goals comments: One-to-one attended Which groups?: Social skills, Wellness One-to-one attended: Self-esteem, stress management Reason goals not met: N/A Therapeutic equipment acquired: None Reason patient discharged from therapy: Discharge from hospital Pt/family agrees with progress & goals achieved: Yes Date patient discharged from therapy: 10/29/16   Leonette Monarch, LRT/CTRS 10/30/2016, 10:31 AM

## 2016-10-31 ENCOUNTER — Emergency Department
Admission: EM | Admit: 2016-10-31 | Discharge: 2016-10-31 | Disposition: A | Discharge status: Home / Self Care | Payer: Medicaid Other | Attending: Emergency Medicine | Admitting: Emergency Medicine

## 2016-10-31 ENCOUNTER — Encounter: Payer: Self-pay | Admitting: Emergency Medicine

## 2016-10-31 DIAGNOSIS — Z7982 Long term (current) use of aspirin: Secondary | ICD-10-CM | POA: Insufficient documentation

## 2016-10-31 DIAGNOSIS — I10 Essential (primary) hypertension: Secondary | ICD-10-CM | POA: Diagnosis not present

## 2016-10-31 DIAGNOSIS — Z21 Asymptomatic human immunodeficiency virus [HIV] infection status: Secondary | ICD-10-CM | POA: Insufficient documentation

## 2016-10-31 DIAGNOSIS — F17210 Nicotine dependence, cigarettes, uncomplicated: Secondary | ICD-10-CM | POA: Insufficient documentation

## 2016-10-31 DIAGNOSIS — Z4802 Encounter for removal of sutures: Secondary | ICD-10-CM | POA: Diagnosis not present

## 2016-10-31 DIAGNOSIS — Z791 Long term (current) use of non-steroidal anti-inflammatories (NSAID): Secondary | ICD-10-CM | POA: Diagnosis not present

## 2016-10-31 DIAGNOSIS — J45909 Unspecified asthma, uncomplicated: Secondary | ICD-10-CM | POA: Insufficient documentation

## 2016-10-31 DIAGNOSIS — Z79899 Other long term (current) drug therapy: Secondary | ICD-10-CM | POA: Diagnosis not present

## 2016-10-31 NOTE — ED Notes (Signed)
Pt verbalized understanding of discharge instructions. NAD at this time. 

## 2016-10-31 NOTE — ED Provider Notes (Signed)
[facility] Emergency Department Provider Note  ____________________________________________  Time seen: Approximately 2:43 PM  I have reviewed the triage vital signs and the nursing notes.   HISTORY  Chief Complaint Suture / Staple Removal    HPI Roy Sada Sr. is a 50 y.o. male , NAD, presents to the emergency department for suture removal about the right foot. States he had surgery at [facility] about the right great toe to repair "arthritis". Sutures were placed after the surgery and was told to follow-up in 7-10 days for suture removal. Patient notes there has been no redness, increased warmth, increased pain, oozing or weeping at the site. He's had no fevers, chills or body aches. Denies abdominal pain, nausea or vomiting. No chest pain or shortness of breath.   Past Medical History:  Diagnosis Date  . Arthritis   . Asthma   . Hepatitis C   . HIV (human immunodeficiency virus infection) (HCC)   . HTN (hypertension)     Patient Active Problem List   Diagnosis Date Noted  . Tobacco use disorder 10/26/2016  . HIV disease (HCC) 10/26/2016  . HTN (hypertension) 10/26/2016  . Dyslipidemia 10/26/2016  . BPH (benign prostatic hyperplasia) 10/26/2016  . Constipation 10/26/2016  . Severe recurrent major depression without psychotic features (HCC) 10/25/2016    Past Surgical History:  Procedure Laterality Date  . HERNIA REPAIR    . TOE SURGERY      Prior to Admission medications   Medication Sig Start Date End Date Taking? Authorizing Provider  acetaminophen (TYLENOL) 500 MG tablet Take 500 mg by mouth every 8 (eight) hours as needed.    Historical Provider, MD  albuterol (PROVENTIL HFA;VENTOLIN HFA) 108 (90 Base) MCG/ACT inhaler Inhale 2 puffs into the lungs every 4 (four) hours as needed for shortness of breath. 10/29/16   Shari Prows, MD  amLODipine (NORVASC) 5 MG tablet Take 1 tablet (5 mg total) by mouth daily. 10/29/16    Shari Prows, MD  aspirin EC 81 MG tablet Take 1 tablet (81 mg total) by mouth daily. 10/29/16   Shari Prows, MD  atorvastatin (LIPITOR) 40 MG tablet Take 1 tablet (40 mg total) by mouth daily at 6 PM. 10/29/16   Jolanta B Pucilowska, MD  citalopram (CELEXA) 20 MG tablet Take 1 tablet (20 mg total) by mouth daily. 10/29/16   Shari Prows, MD  docusate sodium (COLACE) 100 MG capsule Take 1 capsule (100 mg total) by mouth 2 (two) times daily. 10/29/16   Shari Prows, MD  dolutegravir (TIVICAY) 50 MG tablet Take 1 tablet (50 mg total) by mouth daily. 10/29/16   Shari Prows, MD  emtricitabine-tenofovir AF (DESCOVY) 200-25 MG tablet Take 1 tablet by mouth daily. 10/29/16   Shari Prows, MD  ergocalciferol (VITAMIN D2) 50000 units capsule Take 50,000 Units by mouth once a week.    Historical Provider, MD  gabapentin (NEURONTIN) 300 MG capsule Take 1 capsule (300 mg total) by mouth 3 (three) times daily. 10/29/16   Shari Prows, MD  lisinopril (PRINIVIL,ZESTRIL) 20 MG tablet Take 1 tablet (20 mg total) by mouth daily. 10/29/16   Shari Prows, MD  polyethylene glycol (MIRALAX / GLYCOLAX) packet Take 17 g by mouth daily. 10/29/16   Shari Prows, MD  tamsulosin (FLOMAX) 0.4 MG CAPS capsule Take 1 capsule (0.4 mg total) by mouth daily after breakfast. 10/29/16   Shari Prows, MD    Allergies Patient  has no known allergies.  History reviewed. No pertinent family history.  Social History Social History  Substance Use Topics  . Smoking status: Current Every Day Smoker    Types: Cigarettes  . Smokeless tobacco: Never Used  . Alcohol use No     Review of Systems  Constitutional: No fever/chills Cardiovascular: No chest pain. Respiratory: No shortness of breath. Gastrointestinal: No abdominal pain.  No nausea, vomiting.   Musculoskeletal: Negative for foot pain.  Skin: Surgical incision with 9 sutures in place about  right foot. Negative for rash, redness, swelling, oozing, weeping, bleeding. Neurological: Negative for numbness, weakness, tingling. 10-point ROS otherwise negative.  ____________________________________________   PHYSICAL EXAM:  VITAL SIGNS: ED Triage Vitals  Enc Vitals Group     BP 10/31/16 1433 137/76     Pulse Rate 10/31/16 1433 (!) 54     Resp 10/31/16 1433 19     Temp 10/31/16 1433 97.8 F (36.6 C)     Temp src --      SpO2 10/31/16 1433 100 %     Weight --      Height --      Head Circumference --      Peak Flow --      Pain Score 10/31/16 1434 0     Pain Loc --      Pain Edu? --      Excl. in GC? --      Constitutional: Alert and oriented. Well appearing and in no acute distress. Eyes: Conjunctivae are normal.  Head: Atraumatic. Cardiovascular: Good peripheral circulation with 2+ pulses in the right lower extremity. Respiratory: Normal respiratory effort without tachypnea or retractions.  Musculoskeletal: No lower extremity tenderness nor edema.  No joint effusions. No pain to palpation of right foot and toes Neurologic:  Normal speech and language. No gross focal neurologic deficits are appreciated.  Skin:  Well healing surgical incision with 9 sutures in place about the medial, distal right foot consistent with hallux repair. Skin is warm, dry. No rash, redness, swelling, abnormal warmth, oozing, weeping, bleeding noted. Psychiatric: Mood and affect are normal. Speech and behavior are normal. Patient exhibits appropriate insight and judgement.   ____________________________________________   LABS  None ____________________________________________  EKG  None ____________________________________________  RADIOLOGY  None ____________________________________________    PROCEDURES  Procedure(s) performed: None   .Suture Removal Date/Time: 10/31/2016 2:55 PM Performed by: Tye SavoyHAGLER, Landon Truax L Authorized by: Hope PigeonHAGLER, Montague Corella L   Consent:    Consent  obtained:  Verbal   Consent given by:  Patient   Risks discussed:  Bleeding, pain and wound separation   Alternatives discussed:  No treatment Location:    Location:  Lower extremity   Lower extremity location:  Foot   Foot location:  R foot Procedure details:    Wound appearance:  No signs of infection   Number of sutures removed:  9 Post-procedure details:    Post-removal:  No dressing applied   Patient tolerance of procedure:  Tolerated well, no immediate complications     Medications - No data to display   ____________________________________________   INITIAL IMPRESSION / ASSESSMENT AND PLAN / ED COURSE  Pertinent labs & imaging results that were available during my care of the patient were reviewed by me and considered in my medical decision making (see chart for details).  Clinical Course     [name] diagnosis is consistent with encounter for removal of sutures.  Patient advised to keep wound clean and dry. Discussed signs of  infection to monitor. Patient is to follow up with [facility] as needed. Patient is given ED precautions to return to the ED for any worsening or new symptoms.    ____________________________________________  FINAL CLINICAL IMPRESSION(S) / ED DIAGNOSES  Final diagnoses:  Encounter for removal of sutures      NEW MEDICATIONS STARTED DURING THIS VISIT:  Discharge Medication List as of 10/31/2016  2:58 PM           Hope Pigeon, PA-C 10/31/16 1510    Rockne Menghini, MD 10/31/16 1534

## 2016-10-31 NOTE — ED Triage Notes (Signed)
Pt presents to ED for suture removal on right foot

## 2016-10-31 NOTE — ED Notes (Signed)
Pt to ED for suture removal. Not redness, swelling or infection noted upon assessment.

## 2016-12-17 ENCOUNTER — Emergency Department
Admission: EM | Admit: 2016-12-17 | Discharge: 2016-12-17 | Disposition: A | Discharge status: Home / Self Care | Payer: Medicaid Other | Attending: Emergency Medicine | Admitting: Emergency Medicine

## 2016-12-17 ENCOUNTER — Emergency Department: Payer: Medicaid Other

## 2016-12-17 DIAGNOSIS — I10 Essential (primary) hypertension: Secondary | ICD-10-CM | POA: Diagnosis not present

## 2016-12-17 DIAGNOSIS — Z79899 Other long term (current) drug therapy: Secondary | ICD-10-CM | POA: Insufficient documentation

## 2016-12-17 DIAGNOSIS — F1490 Cocaine use, unspecified, uncomplicated: Secondary | ICD-10-CM | POA: Insufficient documentation

## 2016-12-17 DIAGNOSIS — M778 Other enthesopathies, not elsewhere classified: Secondary | ICD-10-CM

## 2016-12-17 DIAGNOSIS — Z21 Asymptomatic human immunodeficiency virus [HIV] infection status: Secondary | ICD-10-CM | POA: Insufficient documentation

## 2016-12-17 DIAGNOSIS — J45909 Unspecified asthma, uncomplicated: Secondary | ICD-10-CM | POA: Insufficient documentation

## 2016-12-17 DIAGNOSIS — F17210 Nicotine dependence, cigarettes, uncomplicated: Secondary | ICD-10-CM | POA: Diagnosis not present

## 2016-12-17 DIAGNOSIS — M779 Enthesopathy, unspecified: Secondary | ICD-10-CM | POA: Insufficient documentation

## 2016-12-17 DIAGNOSIS — Z7982 Long term (current) use of aspirin: Secondary | ICD-10-CM | POA: Insufficient documentation

## 2016-12-17 DIAGNOSIS — M25531 Pain in right wrist: Secondary | ICD-10-CM | POA: Diagnosis present

## 2016-12-17 MED ORDER — MELOXICAM 7.5 MG PO TABS: 7.5000 mg | ORAL_TABLET | Freq: Every day | ORAL | 2 refills | Status: DC

## 2016-12-17 NOTE — ED Notes (Signed)
velcro R wrist splint applied with instructions given.

## 2016-12-17 NOTE — Discharge Instructions (Signed)
Wear cockup wrist splint for immobilization and protection. Begin meloxicam once a day with food. Follow-up with her primary care doctor at [facility] if any continued problems.

## 2016-12-17 NOTE — ED Provider Notes (Signed)
[facility] Emergency Department Provider Note  ____________________________________________   First MD Initiated Contact with Patient 12/17/16 1158     (approximate)  I have reviewed the triage vital signs and the nursing notes.   HISTORY  Chief Complaint Wrist Pain   HPI Roy Noun Sr. is a 51 y.o. male is here complaining of right wrist pain.Patient states that yesterday he was chopping wood and overnight his right wrist began to hurt with increased pain this morning. Patient has full range of motion of his extremity which increases his pain. He denies any injury other than movement of CHOP in lead. He denies any previous problems with his wrist. He has not taken any over-the-counter medication for his wrist pain. Currently he rates his pain as 6/10.   Past Medical History:  Diagnosis Date  . Arthritis   . Asthma   . Hepatitis C   . HIV (human immunodeficiency virus infection) (HCC)   . HTN (hypertension)     Patient Active Problem List   Diagnosis Date Noted  . Tobacco use disorder 10/26/2016  . HIV disease (HCC) 10/26/2016  . HTN (hypertension) 10/26/2016  . Dyslipidemia 10/26/2016  . BPH (benign prostatic hyperplasia) 10/26/2016  . Constipation 10/26/2016  . Severe recurrent major depression without psychotic features (HCC) 10/25/2016    Past Surgical History:  Procedure Laterality Date  . HERNIA REPAIR    . TOE SURGERY      Prior to Admission medications   Medication Sig Start Date End Date Taking? Authorizing Provider  acetaminophen (TYLENOL) 500 MG tablet Take 500 mg by mouth every 8 (eight) hours as needed.    Historical Provider, MD  albuterol (PROVENTIL HFA;VENTOLIN HFA) 108 (90 Base) MCG/ACT inhaler Inhale 2 puffs into the lungs every 4 (four) hours as needed for shortness of breath. 10/29/16   Shari Prows, MD  amLODipine (NORVASC) 5 MG tablet Take 1 tablet (5 mg total) by mouth daily. 10/29/16   Shari Prows, MD  aspirin EC 81 MG tablet Take 1 tablet (81 mg total) by mouth daily. 10/29/16   Shari Prows, MD  atorvastatin (LIPITOR) 40 MG tablet Take 1 tablet (40 mg total) by mouth daily at 6 PM. 10/29/16   Jolanta B Pucilowska, MD  citalopram (CELEXA) 20 MG tablet Take 1 tablet (20 mg total) by mouth daily. 10/29/16   Shari Prows, MD  docusate sodium (COLACE) 100 MG capsule Take 1 capsule (100 mg total) by mouth 2 (two) times daily. 10/29/16   Shari Prows, MD  dolutegravir (TIVICAY) 50 MG tablet Take 1 tablet (50 mg total) by mouth daily. 10/29/16   Shari Prows, MD  emtricitabine-tenofovir AF (DESCOVY) 200-25 MG tablet Take 1 tablet by mouth daily. 10/29/16   Shari Prows, MD  ergocalciferol (VITAMIN D2) 50000 units capsule Take 50,000 Units by mouth once a week.    Historical Provider, MD  gabapentin (NEURONTIN) 300 MG capsule Take 1 capsule (300 mg total) by mouth 3 (three) times daily. 10/29/16   Shari Prows, MD  lisinopril (PRINIVIL,ZESTRIL) 20 MG tablet Take 1 tablet (20 mg total) by mouth daily. 10/29/16   Shari Prows, MD  meloxicam (MOBIC) 7.5 MG tablet Take 1 tablet (7.5 mg total) by mouth daily. With food 12/17/16 12/17/17  Tommi Rumps, PA-C  polyethylene glycol (MIRALAX / GLYCOLAX) packet Take 17 g by mouth daily. 10/29/16   Shari Prows, MD  tamsulosin (FLOMAX) 0.4 MG CAPS capsule  Take 1 capsule (0.4 mg total) by mouth daily after breakfast. 10/29/16   Shari Prows, MD    Allergies Patient has no known allergies.  No family history on file.  Social History Social History  Substance Use Topics  . Smoking status: Current Every Day Smoker    Types: Cigarettes  . Smokeless tobacco: Never Used  . Alcohol use No    Review of Systems Constitutional: No fever/chills Cardiovascular: Denies chest pain. Respiratory: Denies shortness of breath. Gastrointestinal:   No nausea, no vomiting.     Musculoskeletal: Positive for right wrist pain. Skin: Negative for rash. Neurological: Negative for  focal weakness or numbness.  10-point ROS otherwise negative.  ____________________________________________   PHYSICAL EXAM:  VITAL SIGNS: ED Triage Vitals [12/17/16 1127]  Enc Vitals Group     BP 136/86     Pulse Rate 94     Resp 16     Temp 97.7 F (36.5 C)     Temp Source Oral     SpO2 100 %     Weight 195 lb (88.5 kg)     Height      Head Circumference      Peak Flow      Pain Score 6     Pain Loc      Pain Edu?      Excl. in GC?     Constitutional: Alert and oriented. Well appearing and in no acute distress. Eyes: Conjunctivae are normal. PERRL. EOMI. Head: Atraumatic. Nose: No congestion/rhinnorhea. Neck: No stridor.   Cardiovascular: Normal rate, regular rhythm. Grossly normal heart sounds.  Good peripheral circulation. Respiratory: Normal respiratory effort.  No retractions. Lungs CTAB. Musculoskeletal: On examination of the right wrist there is no gross deformity noted and no swelling appreciated in comparison with his left wrist. Range of motion is decreased secondary to pain and on the radial aspect of the wrist there is some crepitus with range of motion. Pulses positive. Skin without ecchymosis or abrasions. The digits move distally without any difficulty. Motor sensory function intact. Neurologic:  Normal speech and language. No gross focal neurologic deficits are appreciated. No gait instability. Skin:  Skin is warm, dry and intact. No rash noted. Psychiatric: Mood and affect are normal. Speech and behavior are normal.  ____________________________________________   LABS (all labs ordered are listed, but only abnormal results are displayed)  Labs Reviewed - No data to display   RADIOLOGY  Right wrist x-ray per radiologist shows old degenerative changes. I, Tommi Rumps, personally viewed and evaluated these images (plain radiographs) as part  of my medical decision making, as well as reviewing the written report by the radiologist. ____________________________________________   PROCEDURES  Procedure(s) performed: None  Procedures  Critical Care performed: No  ____________________________________________   INITIAL IMPRESSION / ASSESSMENT AND PLAN / ED COURSE  Pertinent labs & imaging results that were available during my care of the patient were reviewed by me and considered in my medical decision making (see chart for details).    Clinical Course    Patient is placed in a Velcro cock-up splint. He is also given a prescription for meloxicam 7.5 mg one daily with food for 2 weeks. Patient is follow-up with his primary care doctor at [facility] if any continued problems.  ____________________________________________   FINAL CLINICAL IMPRESSION(S) / ED DIAGNOSES  Final diagnoses:  Right wrist tendonitis      NEW MEDICATIONS STARTED DURING THIS VISIT:  Discharge Medication List as of 12/17/2016 12:34  PM    START taking these medications   Details  meloxicam (MOBIC) 7.5 MG tablet Take 1 tablet (7.5 mg total) by mouth daily. With food, Starting Mon 12/17/2016, Until Tue 12/17/2017, Print         Note:  This document was prepared using Dragon voice recognition software and may include unintentional dictation errors.    Tommi Rumps, PA-C 12/17/16 1252    Sharman Cheek, MD 12/17/16 [phone]

## 2016-12-17 NOTE — ED Triage Notes (Signed)
Right wrist pain after chopping wood yesterday, pain worsening overnight. Full ROM, color WNL of extremity. No deformity. Pt alert and oriented X4, active, cooperative, pt in NAD. RR even and unlabored, color WNL.

## 2016-12-19 ENCOUNTER — Encounter: Payer: Self-pay | Admitting: Emergency Medicine

## 2016-12-19 ENCOUNTER — Emergency Department
Admission: EM | Admit: 2016-12-19 | Discharge: 2016-12-19 | Disposition: A | Discharge status: Home / Self Care | Payer: Medicaid Other | Attending: Emergency Medicine | Admitting: Emergency Medicine

## 2016-12-19 DIAGNOSIS — I10 Essential (primary) hypertension: Secondary | ICD-10-CM | POA: Insufficient documentation

## 2016-12-19 DIAGNOSIS — Z7982 Long term (current) use of aspirin: Secondary | ICD-10-CM | POA: Insufficient documentation

## 2016-12-19 DIAGNOSIS — Z21 Asymptomatic human immunodeficiency virus [HIV] infection status: Secondary | ICD-10-CM | POA: Diagnosis not present

## 2016-12-19 DIAGNOSIS — F1410 Cocaine abuse, uncomplicated: Secondary | ICD-10-CM | POA: Diagnosis present

## 2016-12-19 DIAGNOSIS — F17210 Nicotine dependence, cigarettes, uncomplicated: Secondary | ICD-10-CM | POA: Insufficient documentation

## 2016-12-19 DIAGNOSIS — Z79899 Other long term (current) drug therapy: Secondary | ICD-10-CM | POA: Insufficient documentation

## 2016-12-19 DIAGNOSIS — J45909 Unspecified asthma, uncomplicated: Secondary | ICD-10-CM | POA: Insufficient documentation

## 2016-12-19 LAB — URINE DRUG SCREEN, QUALITATIVE (ARMC ONLY)
Amphetamines, Ur Screen: NOT DETECTED
BARBITURATES, UR SCREEN: NOT DETECTED
BENZODIAZEPINE, UR SCRN: NOT DETECTED
CANNABINOID 50 NG, UR ~~LOC~~: NOT DETECTED
Cocaine Metabolite,Ur ~~LOC~~: POSITIVE — AB
MDMA (Ecstasy)Ur Screen: NOT DETECTED
METHADONE SCREEN, URINE: NOT DETECTED
OPIATE, UR SCREEN: NOT DETECTED
PHENCYCLIDINE (PCP) UR S: NOT DETECTED
Tricyclic, Ur Screen: NOT DETECTED

## 2016-12-19 LAB — COMPREHENSIVE METABOLIC PANEL
ALT: 37 U/L (ref 17–63)
AST: 32 U/L (ref 15–41)
Albumin: 3.3 g/dL — ABNORMAL LOW (ref 3.5–5.0)
Alkaline Phosphatase: 60 U/L (ref 38–126)
Anion gap: 4 — ABNORMAL LOW (ref 5–15)
BILIRUBIN TOTAL: 0.4 mg/dL (ref 0.3–1.2)
BUN: 14 mg/dL (ref 6–20)
CALCIUM: 8.2 mg/dL — AB (ref 8.9–10.3)
CHLORIDE: 113 mmol/L — AB (ref 101–111)
CO2: 24 mmol/L (ref 22–32)
CREATININE: 0.97 mg/dL (ref 0.61–1.24)
Glucose, Bld: 116 mg/dL — ABNORMAL HIGH (ref 65–99)
Potassium: 4 mmol/L (ref 3.5–5.1)
Sodium: 141 mmol/L (ref 135–145)
TOTAL PROTEIN: 6.6 g/dL (ref 6.5–8.1)

## 2016-12-19 LAB — CBC WITH DIFFERENTIAL/PLATELET
Basophils Absolute: 0 10*3/uL (ref 0–0.1)
Basophils Relative: 0 %
EOS PCT: 4 %
Eosinophils Absolute: 0.2 10*3/uL (ref 0–0.7)
HEMATOCRIT: 38.4 % — AB (ref 40.0–52.0)
Hemoglobin: 13.2 g/dL (ref 13.0–18.0)
LYMPHS ABS: 2.1 10*3/uL (ref 1.0–3.6)
LYMPHS PCT: 58 %
MCH: 33.5 pg (ref 26.0–34.0)
MCHC: 34.4 g/dL (ref 32.0–36.0)
MCV: 97.4 fL (ref 80.0–100.0)
MONO ABS: 0.6 10*3/uL (ref 0.2–1.0)
Monocytes Relative: 15 %
NEUTROS ABS: 0.9 10*3/uL — AB (ref 1.4–6.5)
Neutrophils Relative %: 23 %
PLATELETS: 151 10*3/uL (ref 150–440)
RBC: 3.94 MIL/uL — ABNORMAL LOW (ref 4.40–5.90)
RDW: 12.7 % (ref 11.5–14.5)
WBC: 3.6 10*3/uL — ABNORMAL LOW (ref 3.8–10.6)

## 2016-12-19 LAB — ETHANOL: Alcohol, Ethyl (B): <5 mg/dL (ref <5)

## 2016-12-19 LAB — ACETAMINOPHEN LEVEL

## 2016-12-19 LAB — SALICYLATE LEVEL

## 2016-12-19 NOTE — ED Notes (Signed)
Spoke with Reita Cliche, the case manager for the patient, and explained that the patient has been medically cleared and that paperwork has been faxed to [facility].  Explained that the patient would be free to go there and see if they could place him once he is discharged from the hospital.  Patient states he has no transportation.  Reita Cliche explained that he will call [facility] first and make sure they would be willing to take him before transportation is arranged for his pickup.

## 2016-12-19 NOTE — ED Triage Notes (Signed)
Pt ambulatory to triage with steady gait, no distress noted. 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 Pt reports last use 2 days ago.

## 2016-12-19 NOTE — ED Provider Notes (Signed)
-----------------------------------------   1:56 PM on 12/19/2016 -----------------------------------------   Blood pressure 137/73, pulse 60, temperature 97.8 F (36.6 C), temperature source Oral, resp. rate 15, height 5\' 9"  (1.753 m), weight 185 lb (83.9 kg), SpO2 100 %.  The patient had no acute events since last update.  Calm and cooperative at this time.  Patient resting comfortably at this time. Patient to be discharged to the [facility] for rehabilitation. Has a ride.    Myrna Blazer, MD 12/19/16 1356

## 2016-12-19 NOTE — ED Notes (Signed)
Reita Cliche, case Production designer, theatre/television/film, is on his way to be transportation for the patient from [facility] to [facility].

## 2016-12-19 NOTE — ED Notes (Signed)
Pt given lunch tray.

## 2016-12-19 NOTE — Progress Notes (Signed)
Labs and requested documents sent to [facility].  Per [facility], states pt. Can come in for an assessment, no further info was given. Jayvin Hurrell K. Sherlon Handing, LPC-A, [facility]  Counselor 12/19/2016 12:46 PM

## 2016-12-19 NOTE — ED Notes (Signed)
Gave food tray. 

## 2016-12-19 NOTE — ED Provider Notes (Signed)
[facility] Emergency Department Provider Note   ____________________________________________   First MD Initiated Contact with Patient 12/19/16 [phone]     (approximate)  I have reviewed the triage vital signs and the nursing notes.   HISTORY  Chief Complaint Addiction Problem    HPI Roy [name]. is a 51 y.o. male who presents to the ED from home with a chief complaint of desiring detox. Specifically, patient desires to detox from cocaine. States he wants to go to [facility] as he has been there before. Reports not taking his medications for hypertension, HIV or hepatitis C. Voices no medical complaints. Denies active SI/HI/AH/VH. Denies chest pain with cocaine use.   Past Medical History:  Diagnosis Date  . Arthritis   . Asthma   . Hepatitis C   . HIV (human immunodeficiency virus infection) (HCC)   . HTN (hypertension)     Patient Active Problem List   Diagnosis Date Noted  . Tobacco use disorder 10/26/2016  . HIV disease (HCC) 10/26/2016  . HTN (hypertension) 10/26/2016  . Dyslipidemia 10/26/2016  . BPH (benign prostatic hyperplasia) 10/26/2016  . Constipation 10/26/2016  . Severe recurrent major depression without psychotic features (HCC) 10/25/2016    Past Surgical History:  Procedure Laterality Date  . HERNIA REPAIR    . TOE SURGERY      Prior to Admission medications   Medication Sig Start Date End Date Taking? Authorizing Provider  acetaminophen (TYLENOL) 500 MG tablet Take 500 mg by mouth every 8 (eight) hours as needed.    Historical Provider, MD  albuterol (PROVENTIL HFA;VENTOLIN HFA) 108 (90 Base) MCG/ACT inhaler Inhale 2 puffs into the lungs every 4 (four) hours as needed for shortness of breath. 10/29/16   Shari Prows, MD  amLODipine (NORVASC) 5 MG tablet Take 1 tablet (5 mg total) by mouth daily. 10/29/16   Shari Prows, MD  aspirin EC 81 MG tablet Take 1 tablet (81 mg total) by mouth daily.  10/29/16   Shari Prows, MD  atorvastatin (LIPITOR) 40 MG tablet Take 1 tablet (40 mg total) by mouth daily at 6 PM. 10/29/16   Jolanta B Pucilowska, MD  citalopram (CELEXA) 20 MG tablet Take 1 tablet (20 mg total) by mouth daily. 10/29/16   Shari Prows, MD  docusate sodium (COLACE) 100 MG capsule Take 1 capsule (100 mg total) by mouth 2 (two) times daily. 10/29/16   Shari Prows, MD  dolutegravir (TIVICAY) 50 MG tablet Take 1 tablet (50 mg total) by mouth daily. 10/29/16   Shari Prows, MD  emtricitabine-tenofovir AF (DESCOVY) 200-25 MG tablet Take 1 tablet by mouth daily. 10/29/16   Shari Prows, MD  ergocalciferol (VITAMIN D2) 50000 units capsule Take 50,000 Units by mouth once a week.    Historical Provider, MD  gabapentin (NEURONTIN) 300 MG capsule Take 1 capsule (300 mg total) by mouth 3 (three) times daily. 10/29/16   Shari Prows, MD  lisinopril (PRINIVIL,ZESTRIL) 20 MG tablet Take 1 tablet (20 mg total) by mouth daily. 10/29/16   Shari Prows, MD  meloxicam (MOBIC) 7.5 MG tablet Take 1 tablet (7.5 mg total) by mouth daily. With food 12/17/16 12/17/17  Tommi Rumps, PA-C  polyethylene glycol (MIRALAX / GLYCOLAX) packet Take 17 g by mouth daily. 10/29/16   Shari Prows, MD  tamsulosin (FLOMAX) 0.4 MG CAPS capsule Take 1 capsule (0.4 mg total) by mouth daily after breakfast. 10/29/16   Jolanta B  Pucilowska, MD    Allergies Patient has no known allergies.  History reviewed. No pertinent family history.  Social History Social History  Substance Use Topics  . Smoking status: Current Every Day Smoker    Types: Cigarettes  . Smokeless tobacco: Never Used  . Alcohol use No    Review of Systems  Constitutional: No fever/chills. Eyes: No visual changes. ENT: No sore throat. Cardiovascular: Denies chest pain. Respiratory: Denies shortness of breath. Gastrointestinal: No abdominal pain.  No nausea, no vomiting.  No  diarrhea.  No constipation. Genitourinary: Negative for dysuria. Musculoskeletal: Negative for back pain. Skin: Negative for rash. Neurological: Negative for headaches, focal weakness or numbness. Psychiatric: Negative for SI.  10-point ROS otherwise negative.  ____________________________________________   PHYSICAL EXAM:  VITAL SIGNS: ED Triage Vitals [12/19/16 0511]  Enc Vitals Group     BP 137/73     Pulse Rate 60     Resp 15     Temp 97.8 F (36.6 C)     Temp Source Oral     SpO2 100 %     Weight 185 lb (83.9 kg)     Height 5\' 9"  (1.753 m)     Head Circumference      Peak Flow      Pain Score      Pain Loc      Pain Edu?      Excl. in GC?     Constitutional: Alert and oriented. Well appearing and in no acute distress. Eyes: Conjunctivae are normal. PERRL. EOMI. Head: Atraumatic. Nose: No congestion/rhinnorhea. Mouth/Throat: Mucous membranes are moist.  Oropharynx non-erythematous. Neck: No stridor.   Cardiovascular: Normal rate, regular rhythm. Grossly normal heart sounds.  Good peripheral circulation. Respiratory: Normal respiratory effort.  No retractions. Lungs CTAB. Gastrointestinal: Soft and nontender. No distention. No abdominal bruits. No CVA tenderness. Musculoskeletal: No lower extremity tenderness nor edema.  No joint effusions. Neurologic:  Normal speech and language. No gross focal neurologic deficits are appreciated. No gait instability. Skin:  Skin is warm, dry and intact. No rash noted. Psychiatric: Mood and affect are normal. Speech and behavior are normal.  ____________________________________________   LABS (all labs ordered are listed, but only abnormal results are displayed)  Labs Reviewed  CBC WITH DIFFERENTIAL/PLATELET  COMPREHENSIVE METABOLIC PANEL  ACETAMINOPHEN LEVEL  SALICYLATE LEVEL  ETHANOL  URINE DRUG SCREEN, QUALITATIVE ([facility] ONLY)    ____________________________________________  EKG  None ____________________________________________  RADIOLOGY  None ____________________________________________   PROCEDURES  Procedure(s) performed: None  Procedures  Critical Care performed: No  ____________________________________________   INITIAL IMPRESSION / ASSESSMENT AND PLAN / ED COURSE  Pertinent labs & imaging results that were available during my care of the patient were reviewed by me and considered in my medical decision making (see chart for details).  51 year old male desiring cocaine detox. TTS Deanna Artis spoke with patient and [facility] who desires labwork and urine and will consider patient further program. No IVC criteria. Care transferred to Dr. Langston Masker pending laboratory urinalysis results.  Clinical Course as of Dec 19 812  Wed Dec 19, 2016  4782 Laboratory and urinalysis results remarkable for positive cocaine metabolites. At this time patient is medically cleared for behavioral medicine disposition.  [JS]    Clinical Course User Index [JS] Irean Hong, MD     ____________________________________________   FINAL CLINICAL IMPRESSION(S) / ED DIAGNOSES  Final diagnoses:  Cocaine abuse      NEW MEDICATIONS STARTED DURING THIS VISIT:  New Prescriptions  No medications on file     Note:  This document was prepared using Dragon voice recognition software and may include unintentional dictation errors.    Irean Hong, MD 12/19/16 [phone]

## 2016-12-27 DIAGNOSIS — F1020 Alcohol dependence, uncomplicated: Secondary | ICD-10-CM | POA: Insufficient documentation

## 2017-02-17 ENCOUNTER — Emergency Department
Admission: EM | Admit: 2017-02-17 | Discharge: 2017-02-17 | Disposition: A | Discharge status: Home / Self Care | Payer: Medicaid Other | Attending: Emergency Medicine | Admitting: Emergency Medicine

## 2017-02-17 ENCOUNTER — Emergency Department
Admission: EM | Admit: 2017-02-17 | Discharge: 2017-02-18 | Disposition: A | Discharge status: Home / Self Care | Payer: Medicaid Other | Source: Home / Self Care | Attending: Emergency Medicine | Admitting: Emergency Medicine

## 2017-02-17 ENCOUNTER — Emergency Department: Payer: Medicaid Other

## 2017-02-17 ENCOUNTER — Encounter: Payer: Self-pay | Admitting: Emergency Medicine

## 2017-02-17 DIAGNOSIS — J45909 Unspecified asthma, uncomplicated: Secondary | ICD-10-CM | POA: Diagnosis not present

## 2017-02-17 DIAGNOSIS — M1611 Unilateral primary osteoarthritis, right hip: Secondary | ICD-10-CM | POA: Diagnosis not present

## 2017-02-17 DIAGNOSIS — I10 Essential (primary) hypertension: Secondary | ICD-10-CM | POA: Insufficient documentation

## 2017-02-17 DIAGNOSIS — Z79899 Other long term (current) drug therapy: Secondary | ICD-10-CM | POA: Diagnosis not present

## 2017-02-17 DIAGNOSIS — Z21 Asymptomatic human immunodeficiency virus [HIV] infection status: Secondary | ICD-10-CM | POA: Insufficient documentation

## 2017-02-17 DIAGNOSIS — Z7982 Long term (current) use of aspirin: Secondary | ICD-10-CM | POA: Insufficient documentation

## 2017-02-17 DIAGNOSIS — M19071 Primary osteoarthritis, right ankle and foot: Secondary | ICD-10-CM | POA: Diagnosis not present

## 2017-02-17 DIAGNOSIS — Z791 Long term (current) use of non-steroidal anti-inflammatories (NSAID): Secondary | ICD-10-CM | POA: Diagnosis not present

## 2017-02-17 DIAGNOSIS — M25551 Pain in right hip: Secondary | ICD-10-CM | POA: Diagnosis present

## 2017-02-17 DIAGNOSIS — F17210 Nicotine dependence, cigarettes, uncomplicated: Secondary | ICD-10-CM | POA: Insufficient documentation

## 2017-02-17 DIAGNOSIS — R079 Chest pain, unspecified: Secondary | ICD-10-CM

## 2017-02-17 DIAGNOSIS — M19079 Primary osteoarthritis, unspecified ankle and foot: Secondary | ICD-10-CM

## 2017-02-17 HISTORY — DX: Human immunodeficiency virus (HIV) disease: B20

## 2017-02-17 LAB — CBC
HCT: 44.1 % (ref 40.0–52.0)
HEMOGLOBIN: 15.1 g/dL (ref 13.0–18.0)
MCH: 32.6 pg (ref 26.0–34.0)
MCHC: 34.1 g/dL (ref 32.0–36.0)
MCV: 95.5 fL (ref 80.0–100.0)
Platelets: 175 10*3/uL (ref 150–440)
RBC: 4.62 MIL/uL (ref 4.40–5.90)
RDW: 14.2 % (ref 11.5–14.5)
WBC: 6.8 10*3/uL (ref 3.8–10.6)

## 2017-02-17 LAB — COMPREHENSIVE METABOLIC PANEL
ALT: 72 U/L — AB (ref 17–63)
AST: 102 U/L — AB (ref 15–41)
Albumin: 4.3 g/dL (ref 3.5–5.0)
Alkaline Phosphatase: 60 U/L (ref 38–126)
Anion gap: 11 (ref 5–15)
BUN: 38 mg/dL — AB (ref 6–20)
CHLORIDE: 102 mmol/L (ref 101–111)
CO2: 21 mmol/L — AB (ref 22–32)
CREATININE: 1.45 mg/dL — AB (ref 0.61–1.24)
Calcium: 8.8 mg/dL — ABNORMAL LOW (ref 8.9–10.3)
GFR calc Af Amer: >60 mL/min (ref >60)
GFR, EST NON AFRICAN AMERICAN: 54 mL/min — AB (ref >60)
Glucose, Bld: 76 mg/dL (ref 65–99)
Potassium: 3.6 mmol/L (ref 3.5–5.1)
SODIUM: 134 mmol/L — AB (ref 135–145)
Total Bilirubin: 2 mg/dL — ABNORMAL HIGH (ref 0.3–1.2)
Total Protein: 8.2 g/dL — ABNORMAL HIGH (ref 6.5–8.1)

## 2017-02-17 LAB — TROPONIN I: TROPONIN I: 0.03 ng/mL — AB (ref <0.03)

## 2017-02-17 MED ORDER — PREDNISONE 10 MG PO TABS: 10.0000 mg | ORAL_TABLET | Freq: Every day | ORAL | 0 refills | Status: DC

## 2017-02-17 MED ORDER — HYDROCODONE-ACETAMINOPHEN 5-325 MG PO TABS
1.0000 | ORAL_TABLET | ORAL | Status: AC
  Administered 2017-02-17: 1 via ORAL
  Filled 2017-02-17: qty 1

## 2017-02-17 MED ORDER — SODIUM CHLORIDE 0.9 % IV BOLUS (SEPSIS)
1000.0000 mL | INTRAVENOUS | Status: AC
  Administered 2017-02-17: 1000 mL via INTRAVENOUS

## 2017-02-17 NOTE — ED Triage Notes (Signed)
Pt states is being treated for bronchitis. Pt states he has AIDS and began to experience left sided to mid chest pain while ambulating tonight. Pt states he does feel dizzy and is "coughing up black stuff". resps unlabored, pt states pain increases with inspiration.

## 2017-02-17 NOTE — ED Notes (Signed)
Patient transported to X-ray 

## 2017-02-17 NOTE — Discharge Instructions (Signed)
Please take medications as prescribed and return to the ER for any worsening symptoms urgent changes in her health. Please follow-up with orthopedics.

## 2017-02-17 NOTE — ED Notes (Signed)
Patient c/o right foot pain extending up leg. Pt reports hx of surgery to foot.

## 2017-02-17 NOTE — Discharge Instructions (Signed)

## 2017-02-17 NOTE — ED Triage Notes (Signed)
Pt c/o right foot pain for 3-4 days; pt says he had surgery on his foot back in November of last year for arthritis in his great toe; pt with redness and mild swelling to area; pt was seen here last night for chest pain but did not mention his foot as it wasn't as painful; pt says he has been outside working on the yard, on his feet all day; pt in no acute distress

## 2017-02-17 NOTE — ED Provider Notes (Signed)
[facility]-EMERGENCY DEPARTMENT Provider Note   CSN: [id number] Arrival date & time: 02/17/17  1935     History   Chief Complaint Chief Complaint  Patient presents with  . Foot Pain    HPI Roy Chinchilla Duckett Sr. is a 51 y.o. male presents to the emergency department for evaluation of right groin, right great toe pain. Patient has had one week of increasing right great toe pain. He denies any trauma or injury. No warmth or redness. He has increased swelling with walking. He has a history of surgery to the right great toe. Patient also complains of right groin pain that is moderate and described as a deep ache radiating down to the distal thigh anteriorly. No numbness tingling. No back pain. She has not been taking any medications for pain. Denies a history of gout. No fevers.  HPI  Past Medical History:  Diagnosis Date  . AIDS (acquired immune deficiency syndrome) (HCC)   . Arthritis   . Asthma   . Hepatitis C   . HIV (human immunodeficiency virus infection) (HCC)   . HTN (hypertension)     Patient Active Problem List   Diagnosis Date Noted  . Tobacco use disorder 10/26/2016  . HIV disease (HCC) 10/26/2016  . HTN (hypertension) 10/26/2016  . Dyslipidemia 10/26/2016  . BPH (benign prostatic hyperplasia) 10/26/2016  . Constipation 10/26/2016  . Severe recurrent major depression without psychotic features (HCC) 10/25/2016    Past Surgical History:  Procedure Laterality Date  . HERNIA REPAIR    . TOE SURGERY         Home Medications    Prior to Admission medications   Medication Sig Start Date End Date Taking? Authorizing Provider  acetaminophen (TYLENOL) 500 MG tablet Take 500 mg by mouth every 8 (eight) hours as needed.    Historical Provider, MD  albuterol (PROVENTIL HFA;VENTOLIN HFA) 108 (90 Base) MCG/ACT inhaler Inhale 2 puffs into the lungs every 4 (four) hours as needed for shortness of breath. 10/29/16   Shari Prows, MD  amLODipine (NORVASC) 5 MG tablet  Take 1 tablet (5 mg total) by mouth daily. 10/29/16   Shari Prows, MD  aspirin EC 81 MG tablet Take 1 tablet (81 mg total) by mouth daily. 10/29/16   Shari Prows, MD  atorvastatin (LIPITOR) 40 MG tablet Take 1 tablet (40 mg total) by mouth daily at 6 PM. 10/29/16   Jolanta B Pucilowska, MD  citalopram (CELEXA) 20 MG tablet Take 1 tablet (20 mg total) by mouth daily. 10/29/16   Shari Prows, MD  docusate sodium (COLACE) 100 MG capsule Take 1 capsule (100 mg total) by mouth 2 (two) times daily. 10/29/16   Shari Prows, MD  dolutegravir (TIVICAY) 50 MG tablet Take 1 tablet (50 mg total) by mouth daily. 10/29/16   Shari Prows, MD  emtricitabine-tenofovir AF (DESCOVY) 200-25 MG tablet Take 1 tablet by mouth daily. Patient not taking: Reported on 02/17/2017 10/29/16   Shari Prows, MD  ergocalciferol (VITAMIN D2) 50000 units capsule Take 50,000 Units by mouth once a week.    Historical Provider, MD  gabapentin (NEURONTIN) 300 MG capsule Take 1 capsule (300 mg total) by mouth 3 (three) times daily. Patient not taking: Reported on 02/17/2017 10/29/16   Shari Prows, MD  lisinopril (PRINIVIL,ZESTRIL) 20 MG tablet Take 1 tablet (20 mg total) by mouth daily. 10/29/16   Shari Prows, MD  meloxicam (MOBIC) 7.5 MG tablet Take 1 tablet (7.5 mg total)  by mouth daily. With food 12/17/16 12/17/17  Tommi Rumps, PA-C  polyethylene glycol (MIRALAX / GLYCOLAX) packet Take 17 g by mouth daily. 10/29/16   Shari Prows, MD  predniSONE (DELTASONE) 10 MG tablet Take 1 tablet (10 mg total) by mouth daily. 6,5,4,3,2,1 six day taper 02/17/17   Evon Slack, PA-C  tamsulosin [facility]) 0.4 MG CAPS capsule Take 1 capsule (0.4 mg total) by mouth daily after breakfast. 10/29/16   Shari Prows, MD    Family History No family history on file.  Social History Social History  Substance Use Topics  . Smoking status: Current Every Day Smoker    Types:  Cigarettes  . Smokeless tobacco: Never Used  . Alcohol use No     Allergies   Lactose and Pollen extract   Review of Systems Review of Systems  Constitutional: Negative for chills and fever.  HENT: Negative for ear pain and sore throat.   Eyes: Negative for pain and visual disturbance.  Respiratory: Negative for cough and shortness of breath.   Cardiovascular: Negative for chest pain and palpitations.  Gastrointestinal: Negative for abdominal pain and vomiting.  Genitourinary: Negative for dysuria and hematuria.  Musculoskeletal: Positive for arthralgias and joint swelling. Negative for back pain.  Skin: Negative for color change and rash.  Neurological: Negative for seizures and syncope.  All other systems reviewed and are negative.    Physical Exam Updated Vital Signs BP 133/78 (BP Location: Left Arm)   Pulse (!) 51   Temp 98 F (36.7 C) (Oral)   Resp 16   Ht 5\' 9"  (1.753 m)   Wt 96.2 kg   SpO2 97%   BMI 31.31 kg/m   Physical Exam  Constitutional: He appears well-developed and well-nourished.  HENT:  Head: Normocephalic and atraumatic.  Eyes: Conjunctivae are normal.  Neck: Normal range of motion. Neck supple.  Cardiovascular: Normal rate, regular rhythm and normal heart sounds.   No murmur heard. Pulmonary/Chest: Effort normal and breath sounds normal. No respiratory distress. He has no wheezes. He has no rales.  Abdominal: Soft. There is no tenderness. There is no guarding.  Musculoskeletal: He exhibits no edema.  Examination of the right lower extremity shows patient has no swelling warmth erythema or edema. He is tender along the first MTP joint of the right foot with no skin breakdown noted. No significant tenderness to palpation. No warmth or erythema. Negative Homans sign. Patient has painful right hip internal rotation with stiffness, limited range of motion with hip internal rotation.  Neurological: He is alert.  Skin: Skin is warm and dry.    Psychiatric: He has a normal mood and affect.  Nursing note and vitals reviewed.    ED Treatments / Results  Labs (all labs ordered are listed, but only abnormal results are displayed) Labs Reviewed - No data to display  EKG  EKG Interpretation None       Radiology Dg Chest 2 View  Result Date: 02/17/2017 CLINICAL DATA:  Left-sided chest pain. EXAM: CHEST  2 VIEW COMPARISON:  Radiographs 12/04/2015 FINDINGS: The cardiomediastinal contours are normal. The lungs are clear. Pulmonary vasculature is normal. No consolidation, pleural effusion, or pneumothorax. No acute osseous abnormalities are seen. IMPRESSION: No acute abnormality. Electronically Signed   By: Rubye Oaks M.D.   On: 02/17/2017 03:35   Dg Toe Great Right  Result Date: 02/17/2017 CLINICAL DATA:  Right foot pain times 3-4 days. Erythema and swelling. EXAM: RIGHT GREAT TOE COMPARISON:  None. FINDINGS:  Osteoarthritic joint space narrowing and spurring along the medial aspect of the first MTP articulation. No acute fractures identified. There is soft tissue swelling along the medial aspect of the first metatarsal head with large extra-articular erosion involving medial first metatarsal head. Findings are strongly suspicious for crystalline arthropathy and gout. No acute fracture or dislocations. 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 Osteoarthritis of the first MTP. No acute fracture. Electronically Signed   By: Tollie Eth M.D.   On: 02/17/2017 23:15   Dg Hip Unilat W Or Wo Pelvis 2-3 Views Right  Result Date: 02/17/2017 CLINICAL DATA:  Right groin pain radiating down leg x1 week. EXAM: DG HIP (WITH OR WITHOUT PELVIS) 2-3V RIGHT COMPARISON:  None. FINDINGS: Joint space narrowing of both hips right slightly worse than left with subchondral cystic change of the acetabular components bilaterally.  Osteoarthritis with spurring off the femoral heads bilaterally. There is lower lumbar facet arthropathy and hypertrophy at L4-5. The bony pelvis appears intact. SI joints and pubic symphysis are unremarkable. Small phlebolith or injection granuloma adjacent to the left hip laterally. IMPRESSION: Lower lumbar facet arthropathy L4-5 bilaterally. Osteoarthritis of both hips with joint space narrowing, spurring and degenerative subchondral cystic lucencies noted. Electronically Signed   By: Tollie Eth M.D.   On: 02/17/2017 23:12    Procedures Procedures (including critical care time)  Medications Ordered in ED Medications  HYDROcodone-acetaminophen (NORCO/VICODIN) 5-325 MG per tablet 1 tablet (1 tablet Oral Given 02/17/17 2305)     Initial Impression / Assessment and Plan / ED Course  I have reviewed the triage vital signs and the nursing notes.  Pertinent labs & imaging results that were available during my care of the patient were reviewed by me and considered in my medical decision making (see chart for details).     51 year old male with right hip osteoarthritis and right great toe first MTP arthritis, possibly secondary to gout. No signs of infection today. Patient is started on a 6 day steroid taper. He'll follow-up with orthopedics. He'll return to the ER for any worsening symptoms urgent changes in his health.  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 6,5,4,3,2,1 six day taper     Evon Slack, PA-C 02/17/17 2356    Jeanmarie Plant, MD 02/18/17 [phone]

## 2017-02-17 NOTE — ED Notes (Signed)
Pt up to commode for bowel movement.  

## 2017-02-17 NOTE — ED Provider Notes (Signed)
[facility] Emergency Department Provider Note  ____________________________________________   First MD Initiated Contact with Patient 02/17/17 0327     (approximate)  I have reviewed the triage vital signs and the nursing notes.   HISTORY  Chief Complaint Chest Pain    HPI Gio Janoski Sr. is a 51 y.o. male with a history that includes HIV/AIDS for which he is followed at [facility] who presents by EMS for evaluation of acute onset chest pain.  He reports that he was seen a few months ago at [facility] for chest pain and had a stress test, but they told him "everything was okay."  He states that he regularly has chest pain and shortness of breath, particularly with exertion, but this was worse.  Tonight he watched the basketball game at his uncle's house and was walking home when he developed a severe cramping sensation in the left upper chest with some shortness of breath.  He sat down to rest and called EMS.  He feels better now but still feels some discomfort in his chest which is reproducible with palpation and movement of his torso.    Chest pain was severe, now it is mild.  He denies fever/chills, nausea/vomiting, sweating, headache, abdominal pain, dizziness/lightheadedness.    Past Medical History:  Diagnosis Date  . AIDS (acquired immune deficiency syndrome) (HCC)   . Arthritis   . Asthma   . Hepatitis C   . HIV (human immunodeficiency virus infection) (HCC)   . HTN (hypertension)     Patient Active Problem List   Diagnosis Date Noted  . Tobacco use disorder 10/26/2016  . HIV disease (HCC) 10/26/2016  . HTN (hypertension) 10/26/2016  . Dyslipidemia 10/26/2016  . BPH (benign prostatic hyperplasia) 10/26/2016  . Constipation 10/26/2016  . Severe recurrent major depression without psychotic features (HCC) 10/25/2016    Past Surgical History:  Procedure Laterality Date  . HERNIA REPAIR    . TOE SURGERY      Prior to Admission medications     Medication Sig Start Date End Date Taking? Authorizing Provider  acetaminophen (TYLENOL) 500 MG tablet Take 500 mg by mouth every 8 (eight) hours as needed.   Yes Historical Provider, MD  albuterol (PROVENTIL HFA;VENTOLIN HFA) 108 (90 Base) MCG/ACT inhaler Inhale 2 puffs into the lungs every 4 (four) hours as needed for shortness of breath. 10/29/16  Yes Jolanta B Pucilowska, MD  amLODipine (NORVASC) 5 MG tablet Take 1 tablet (5 mg total) by mouth daily. 10/29/16  Yes Shari Prows, MD  aspirin EC 81 MG tablet Take 1 tablet (81 mg total) by mouth daily. 10/29/16  Yes Jolanta B Pucilowska, MD  atorvastatin (LIPITOR) 40 MG tablet Take 1 tablet (40 mg total) by mouth daily at 6 PM. 10/29/16  Yes Jolanta B Pucilowska, MD  citalopram (CELEXA) 20 MG tablet Take 1 tablet (20 mg total) by mouth daily. 10/29/16  Yes Jolanta B Pucilowska, MD  docusate sodium (COLACE) 100 MG capsule Take 1 capsule (100 mg total) by mouth 2 (two) times daily. 10/29/16  Yes Jolanta B Pucilowska, MD  dolutegravir (TIVICAY) 50 MG tablet Take 1 tablet (50 mg total) by mouth daily. 10/29/16  Yes Jolanta B Pucilowska, MD  lisinopril (PRINIVIL,ZESTRIL) 20 MG tablet Take 1 tablet (20 mg total) by mouth daily. 10/29/16  Yes Jolanta B Pucilowska, MD  meloxicam (MOBIC) 7.5 MG tablet Take 1 tablet (7.5 mg total) by mouth daily. With food 12/17/16 12/17/17 Yes Tommi Rumps, PA-C  polyethylene glycol (MIRALAX / GLYCOLAX) packet Take 17 g by mouth daily. 10/29/16  Yes Shari Prows, MD  tamsulosin (FLOMAX) 0.4 MG CAPS capsule Take 1 capsule (0.4 mg total) by mouth daily after breakfast. 10/29/16  Yes Jolanta B Pucilowska, MD  emtricitabine-tenofovir AF (DESCOVY) 200-25 MG tablet Take 1 tablet by mouth daily. Patient not taking: Reported on 02/17/2017 10/29/16   Shari Prows, MD  ergocalciferol (VITAMIN D2) 50000 units capsule Take 50,000 Units by mouth once a week.    Historical Provider, MD  gabapentin (NEURONTIN) 300 MG  capsule Take 1 capsule (300 mg total) by mouth 3 (three) times daily. Patient not taking: Reported on 02/17/2017 10/29/16   Shari Prows, MD    Allergies Lactose and Pollen extract  No family history on file.  Social History Social History  Substance Use Topics  . Smoking status: Current Every Day Smoker    Types: Cigarettes  . Smokeless tobacco: Never Used  . Alcohol use No    Review of Systems Constitutional: No fever/chills Eyes: No visual changes. ENT: No sore throat. Cardiovascular: +chest pain. Respiratory: +shortness of breath. Gastrointestinal: No abdominal pain.  No nausea, no vomiting.  No diarrhea.  No constipation. Genitourinary: Negative for dysuria. Musculoskeletal: Negative for back pain. Skin: Negative for rash. Neurological: Negative for headaches, focal weakness or numbness.  10-point ROS otherwise negative.  ____________________________________________   PHYSICAL EXAM:  VITAL SIGNS: ED Triage Vitals  Enc Vitals Group     BP 02/17/17 0256 (!) 141/88     Pulse Rate 02/17/17 0259 63     Resp 02/17/17 0256 20     Temp 02/17/17 0259 98 F (36.7 C)     Temp Source 02/17/17 0256 Oral     SpO2 02/17/17 0259 96 %     Weight 02/17/17 0256 212 lb (96.2 kg)     Height 02/17/17 0256 5\' 9"  (1.753 m)     Head Circumference --      Peak Flow --      Pain Score 02/17/17 0256 7     Pain Loc --      Pain Edu? --      Excl. in GC? --     Constitutional: Alert and oriented. Well appearing and in no acute distress. Eyes: Conjunctivae are normal. PERRL. EOMI. Head: Atraumatic. Nose: No congestion/rhinnorhea. Mouth/Throat: Mucous membranes are moist. Neck: No stridor.  No meningeal signs.   Cardiovascular: Normal rate, regular rhythm. Good peripheral circulation. Grossly normal heart sounds.  Reproducible anterior left sided chest wall tenderness to palpation Respiratory: Normal respiratory effort.  No retractions. Lungs CTAB. Gastrointestinal: Soft  and nontender. No distention.  Musculoskeletal: No lower extremity tenderness nor edema. No gross deformities of extremities. Neurologic:  Normal speech and language. No gross focal neurologic deficits are appreciated.  Skin:  Skin is warm, dry and intact. No rash noted. Psychiatric: Mood and affect are normal. Speech and behavior are normal.  ____________________________________________   LABS (all labs ordered are listed, but only abnormal results are displayed)  Labs Reviewed  TROPONIN I - Abnormal; Notable for the following:       Result Value   Troponin I 0.03 (*)    All other components within normal limits  COMPREHENSIVE METABOLIC PANEL - Abnormal; Notable for the following:    Sodium 134 (*)    CO2 21 (*)    BUN 38 (*)    Creatinine, Ser 1.45 (*)    Calcium 8.8 (*)    Total  Protein 8.2 (*)    AST 102 (*)    ALT 72 (*)    Total Bilirubin 2.0 (*)    GFR calc non Af Amer 54 (*)    All other components within normal limits  CBC  TROPONIN I   ____________________________________________  EKG  ED ECG REPORT I, Khloee Garza, the attending physician, personally viewed and interpreted this ECG.  Date: 02/17/2017 EKG Time: 3:00 AM Rate: 61 Rhythm: normal sinus rhythm QRS Axis: normal Intervals: normal ST/T Wave abnormalities: normal Conduction Disturbances: none Narrative Interpretation: unremarkable  ____________________________________________  RADIOLOGY   Dg Chest 2 View  Result Date: 02/17/2017 CLINICAL DATA:  Left-sided chest pain. EXAM: CHEST  2 VIEW COMPARISON:  Radiographs 12/04/2015 FINDINGS: The cardiomediastinal contours are normal. The lungs are clear. Pulmonary vasculature is normal. No consolidation, pleural effusion, or pneumothorax. No acute osseous abnormalities are seen. IMPRESSION: No acute abnormality. Electronically Signed   By: Rubye Oaks M.D.   On: 02/17/2017 03:35     ____________________________________________   PROCEDURES  Procedure(s) performed:   Procedures   Critical Care performed: No ____________________________________________   INITIAL IMPRESSION / ASSESSMENT AND PLAN / ED COURSE  Pertinent labs & imaging results that were available during my care of the patient were reviewed by me and considered in my medical decision making (see chart for details).  I verified in CareEverywhere That about 6 months ago he had an exercise stress test at [facility] that was nondiagnostic followed by a dobutamine stress echo which was low risk with no evidence of ischemia.  6 months prior to that he had a nuclear medicine study here at [facility] that was negative for ischemia.  He does have risk factors of hypertension and tobacco abuse but he has had relatively recent his initial troponin here was 0.03.  I will give a full dose aspirin and we will check his 3 hour troponin to determine whether or not he needs to come into this hospital or whether he is appropriate for outpatient follow-up.  I discussed all of this with the patient and he understands and agrees with the plan.    Clinical Course as of Feb 18 651  Wynelle Link Feb 17, 2017  0336 I reviewed the patient's prescription history over the last 12 months in the multi-state controlled substances database(s) that includes [place], [place], [place], [place], [place], [place], [place], [place], [place], [place], [place], [place], [place], and [place].  The patient has filled no controlled substances during that time.   [CF]  0554 Although creatinine is slightly elevated, I reviewed prior results including those at [facility] that are available and care everywhere and he has a widely variable creatinine.  The current one of 1.4 is consistent with some of his prior values.  I have given 1 L normal saline for rehydration but I do not believe this represents an acute or emergent renal  insufficiency.  [CF]  [phone] Repeat troponin was negative.  Patient has been sleeping comfortably and has no pain or discomfort at this time.I gave my usual and customary return precautions and he assured me he will follow-up in [facility].  [CF]    Clinical Course User Index [CF] Loleta Rose, MD    ____________________________________________  FINAL CLINICAL IMPRESSION(S) / ED DIAGNOSES  Final diagnoses:  Chest pain, unspecified type     MEDICATIONS GIVEN DURING THIS VISIT:  Medications  sodium chloride 0.9 % bolus 1,000 mL (0 mLs Intravenous Stopped 02/17/17 0600)     NEW  OUTPATIENT MEDICATIONS STARTED DURING THIS VISIT:  New Prescriptions   No medications on file    Modified Medications   No medications on file    Discontinued Medications   No medications on file     Note:  This document was prepared using Dragon voice recognition software and may include unintentional dictation errors.    Loleta Rose, MD 02/17/17 [phone]

## 2017-02-18 NOTE — ED Notes (Signed)
Reviewed d/c instructions, follow-up care, prescription, and cryotherapy with patient. Pt verbalized understanding

## 2017-02-19 ENCOUNTER — Encounter: Payer: Self-pay | Admitting: Emergency Medicine

## 2017-02-19 ENCOUNTER — Emergency Department
Admission: EM | Admit: 2017-02-19 | Discharge: 2017-02-20 | Disposition: A | Discharge status: Other Acute Inpatient Hospital | Payer: Medicaid Other | Attending: Emergency Medicine | Admitting: Emergency Medicine

## 2017-02-19 DIAGNOSIS — Z79899 Other long term (current) drug therapy: Secondary | ICD-10-CM | POA: Insufficient documentation

## 2017-02-19 DIAGNOSIS — R45851 Suicidal ideations: Secondary | ICD-10-CM | POA: Diagnosis present

## 2017-02-19 DIAGNOSIS — F1420 Cocaine dependence, uncomplicated: Secondary | ICD-10-CM

## 2017-02-19 DIAGNOSIS — F1994 Other psychoactive substance use, unspecified with psychoactive substance-induced mood disorder: Secondary | ICD-10-CM | POA: Diagnosis not present

## 2017-02-19 DIAGNOSIS — J45909 Unspecified asthma, uncomplicated: Secondary | ICD-10-CM | POA: Diagnosis not present

## 2017-02-19 DIAGNOSIS — Z7982 Long term (current) use of aspirin: Secondary | ICD-10-CM | POA: Diagnosis not present

## 2017-02-19 DIAGNOSIS — F1410 Cocaine abuse, uncomplicated: Secondary | ICD-10-CM | POA: Diagnosis not present

## 2017-02-19 DIAGNOSIS — F17210 Nicotine dependence, cigarettes, uncomplicated: Secondary | ICD-10-CM | POA: Insufficient documentation

## 2017-02-19 DIAGNOSIS — B20 Human immunodeficiency virus [HIV] disease: Secondary | ICD-10-CM | POA: Insufficient documentation

## 2017-02-19 DIAGNOSIS — I10 Essential (primary) hypertension: Secondary | ICD-10-CM | POA: Diagnosis not present

## 2017-02-19 HISTORY — DX: Gastro-esophageal reflux disease without esophagitis: K21.9

## 2017-02-19 HISTORY — DX: Bipolar disorder, unspecified: F31.9

## 2017-02-19 HISTORY — DX: Depression, unspecified: F32.A

## 2017-02-19 HISTORY — DX: Bronchitis, not specified as acute or chronic: J40

## 2017-02-19 HISTORY — DX: Major depressive disorder, single episode, unspecified: F32.9

## 2017-02-19 LAB — COMPREHENSIVE METABOLIC PANEL
ALBUMIN: 4.3 g/dL (ref 3.5–5.0)
ALK PHOS: 66 U/L (ref 38–126)
ALT: 55 U/L (ref 17–63)
AST: 45 U/L — AB (ref 15–41)
Anion gap: 7 (ref 5–15)
BUN: 13 mg/dL (ref 6–20)
CALCIUM: 9.1 mg/dL (ref 8.9–10.3)
CHLORIDE: 105 mmol/L (ref 101–111)
CO2: 25 mmol/L (ref 22–32)
CREATININE: 0.93 mg/dL (ref 0.61–1.24)
GFR calc non Af Amer: >60 mL/min (ref >60)
GLUCOSE: 96 mg/dL (ref 65–99)
Potassium: 3.7 mmol/L (ref 3.5–5.1)
SODIUM: 137 mmol/L (ref 135–145)
Total Bilirubin: 1 mg/dL (ref 0.3–1.2)
Total Protein: 8.3 g/dL — ABNORMAL HIGH (ref 6.5–8.1)

## 2017-02-19 LAB — CBC
HEMATOCRIT: 43.9 % (ref 40.0–52.0)
HEMOGLOBIN: 15.2 g/dL (ref 13.0–18.0)
MCH: 33 pg (ref 26.0–34.0)
MCHC: 34.6 g/dL (ref 32.0–36.0)
MCV: 95.3 fL (ref 80.0–100.0)
Platelets: 187 10*3/uL (ref 150–440)
RBC: 4.61 MIL/uL (ref 4.40–5.90)
RDW: 13.8 % (ref 11.5–14.5)
WBC: 5.2 10*3/uL (ref 3.8–10.6)

## 2017-02-19 LAB — URINE DRUG SCREEN, QUALITATIVE (ARMC ONLY)
Amphetamines, Ur Screen: NOT DETECTED
Barbiturates, Ur Screen: NOT DETECTED
Benzodiazepine, Ur Scrn: NOT DETECTED
CANNABINOID 50 NG, UR ~~LOC~~: NOT DETECTED
COCAINE METABOLITE, UR ~~LOC~~: POSITIVE — AB
MDMA (ECSTASY) UR SCREEN: NOT DETECTED
METHADONE SCREEN, URINE: NOT DETECTED
OPIATE, UR SCREEN: NOT DETECTED
Phencyclidine (PCP) Ur S: NOT DETECTED
TRICYCLIC, UR SCREEN: NOT DETECTED

## 2017-02-19 LAB — ACETAMINOPHEN LEVEL: Acetaminophen (Tylenol), Serum: <10 ug/mL — ABNORMAL LOW (ref 10–30)

## 2017-02-19 LAB — ETHANOL: Alcohol, Ethyl (B): <5 mg/dL (ref <5)

## 2017-02-19 LAB — SALICYLATE LEVEL

## 2017-02-19 MED ORDER — DIPHENHYDRAMINE HCL 25 MG PO CAPS
25.0000 mg | ORAL_CAPSULE | Freq: Four times a day (QID) | ORAL | Status: DC | PRN
  Administered 2017-02-19: 25 mg via ORAL

## 2017-02-19 MED ORDER — CEFAZOLIN IN D5W 1 GM/50ML IV SOLN
INTRAVENOUS | Status: AC
  Filled 2017-02-19: qty 50

## 2017-02-19 MED ORDER — ACETAMINOPHEN 500 MG PO TABS
1000.0000 mg | ORAL_TABLET | Freq: Four times a day (QID) | ORAL | Status: DC | PRN
  Administered 2017-02-19 – 2017-02-20 (×2): 1000 mg via ORAL
  Filled 2017-02-19 (×2): qty 2

## 2017-02-19 MED ORDER — OXYMETAZOLINE HCL 0.05 % NA SOLN
1.0000 | Freq: Once | NASAL | Status: AC
  Administered 2017-02-19: 1 via NASAL
  Filled 2017-02-19: qty 15

## 2017-02-19 MED ORDER — SALINE SPRAY 0.65 % NA SOLN
1.0000 | Freq: Once | NASAL | Status: DC
  Filled 2017-02-19: qty 44

## 2017-02-19 MED ORDER — DIPHENHYDRAMINE HCL 25 MG PO CAPS
ORAL_CAPSULE | ORAL | Status: AC
Start: 2017-02-19 — End: 2017-02-19
  Administered 2017-02-19: 25 mg via ORAL
  Filled 2017-02-19: qty 1

## 2017-02-19 NOTE — ED Notes (Signed)
Patient has been quiet with eyes closed and unlabored respirations.  POC discussed. 15' visual checks continued.

## 2017-02-19 NOTE — ED Notes (Signed)
ENVIRONMENTAL ASSESSMENT  Potentially harmful objects out of patient reach: Yes.  Personal belongings secured: Yes.  Patient dressed in hospital provided attire only: Yes.  Plastic bags out of patient reach: Yes.  Patient care equipment (cords, cables, call bells, lines, and drains) shortened, removed, or accounted for: Yes.  Equipment and supplies removed from bottom of stretcher: Yes.  Potentially toxic materials out of patient reach: Yes.  Sharps container removed or out of patient reach: Yes  BEHAVIORAL HEALTH ROUNDING  Patient sleeping: No.  Patient alert and oriented: yes  Behavior appropriate: Yes. ; If no, describe:  Nutrition and fluids offered: Yes  Toileting and hygiene offered: Yes  Sitter present: not applicable, Q 15 min safety rounds and observation via security camera. Law enforcement present: Yes ODS  Pt brought into ED BHU via sally port and wand with metal detector for safety by ODS officer. Patient oriented to unit/care area: Pt informed of unit policies and procedures.  Informed that, for their safety, care areas are designed for safety and monitored by security cameras at all times; and visiting hours explained to patient. Patient verbalizes understanding, and verbal contract for safety obtained.Pt shown to their room.   ED BHU PLACEMENT JUSTIFICATION  Is the patient under IVC or is there intent for IVC: No.  Is the patient medically cleared: Yes.  Is there vacancy in the ED BHU: Yes.  Is the population mix appropriate for patient: Yes.  Is the patient awaiting placement in inpatient or outpatient setting: Pending psych consult. Has the patient had a psychiatric consult: Pending.  Survey of unit performed for contraband, proper placement and condition of furniture, tampering with fixtures in bathroom, shower, and each patient room: Yes. ; Findings: All clear  APPEARANCE/BEHAVIOR  calm, cooperative and adequate rapport can be established  NEURO ASSESSMENT   Orientation: time, place and person  Hallucinations: No.None noted (Hallucinations)  Speech: Normal  Gait: normal  RESPIRATORY ASSESSMENT  WNL  CARDIOVASCULAR ASSESSMENT  WNL  GASTROINTESTINAL ASSESSMENT  WNL  EXTREMITIES  WNL  PLAN OF CARE  Provide calm/safe environment. Vital signs assessed TID. ED BHU Assessment once each 12-hour shift. Collaborate with TTS daily or as condition indicates. Assure the ED provider has rounded once each shift. Provide and encourage hygiene. Provide redirection as needed. Assess for escalating behavior; address immediately and inform ED provider.  Assess family dynamic and appropriateness for visitation as needed: Yes. ; If necessary, describe findings:  Educate the patient/family about BHU procedures/visitation: Yes. ; If necessary, describe findings: Pt is calm and cooperative at this time. Pt understanding and accepting of unit procedures/rules. Will continue to monitor with Q 15 min safety rounds and observation via security camera.    Marland Kitchen

## 2017-02-19 NOTE — ED Provider Notes (Signed)
[facility] Emergency Department Provider Note   ____________________________________________   First MD Initiated Contact with Patient 02/19/17 [phone]     (approximate)  I have reviewed the triage vital signs and the nursing notes.   HISTORY  Chief Complaint Psychiatric Evaluation and Suicidal    HPI Roy Ghee Sr. is a 51 y.o. male who comes into the hospital today with suicidal thoughts. He reports that they started right before he came into the hospital. He was using cocaine when he started having these symptoms. He denies using any other drugs or drinking alcohol. He's had a history of suicidal thoughts with an attempt. He reports the last time was a few months ago. The patient denies any auditory or actions. He reports that he is feeling depressed. He is here today for evaluation of these symptoms.   Past Medical History:  Diagnosis Date  . AIDS (acquired immune deficiency syndrome) (HCC)   . Arthritis   . Asthma   . Bipolar disorder (HCC)   . Bronchitis   . Depression   . GERD (gastroesophageal reflux disease)   . Hepatitis C   . HIV (human immunodeficiency virus infection) (HCC)   . HTN (hypertension)     Patient Active Problem List   Diagnosis Date Noted  . Tobacco use disorder 10/26/2016  . HIV disease (HCC) 10/26/2016  . HTN (hypertension) 10/26/2016  . Dyslipidemia 10/26/2016  . BPH (benign prostatic hyperplasia) 10/26/2016  . Constipation 10/26/2016  . Severe recurrent major depression without psychotic features (HCC) 10/25/2016    Past Surgical History:  Procedure Laterality Date  . HERNIA REPAIR    . TOE SURGERY      Prior to Admission medications   Medication Sig Start Date End Date Taking? Authorizing Provider  acetaminophen (TYLENOL) 500 MG tablet Take 500 mg by mouth every 8 (eight) hours as needed.   Yes Historical Provider, MD  albuterol (PROVENTIL HFA;VENTOLIN HFA) 108 (90 Base) MCG/ACT inhaler Inhale 2  puffs into the lungs every 4 (four) hours as needed for shortness of breath. 10/29/16  Yes Jolanta B Pucilowska, MD  amLODipine (NORVASC) 5 MG tablet Take 1 tablet (5 mg total) by mouth daily. 10/29/16  Yes Shari Prows, MD  aspirin EC 81 MG tablet Take 1 tablet (81 mg total) by mouth daily. 10/29/16  Yes Jolanta B Pucilowska, MD  atorvastatin (LIPITOR) 40 MG tablet Take 1 tablet (40 mg total) by mouth daily at 6 PM. 10/29/16  Yes Jolanta B Pucilowska, MD  citalopram (CELEXA) 20 MG tablet Take 1 tablet (20 mg total) by mouth daily. 10/29/16  Yes Jolanta B Pucilowska, MD  docusate sodium (COLACE) 100 MG capsule Take 1 capsule (100 mg total) by mouth 2 (two) times daily. 10/29/16  Yes Jolanta B Pucilowska, MD  dolutegravir (TIVICAY) 50 MG tablet Take 1 tablet (50 mg total) by mouth daily. 10/29/16  Yes Jolanta B Pucilowska, MD  lisinopril (PRINIVIL,ZESTRIL) 20 MG tablet Take 1 tablet (20 mg total) by mouth daily. 10/29/16  Yes Jolanta B Pucilowska, MD  meloxicam (MOBIC) 7.5 MG tablet Take 1 tablet (7.5 mg total) by mouth daily. With food 12/17/16 12/17/17 Yes Tommi Rumps, PA-C  polyethylene glycol (MIRALAX / GLYCOLAX) packet Take 17 g by mouth daily. 10/29/16  Yes Jolanta B Pucilowska, MD  predniSONE (DELTASONE) 10 MG tablet Take 1 tablet (10 mg total) by mouth daily. 6,5,4,3,2,1 six day taper 02/17/17  Yes Evon Slack, PA-C  tamsulosin (FLOMAX) 0.4 MG CAPS capsule  Take 1 capsule (0.4 mg total) by mouth daily after breakfast. 10/29/16  Yes Jolanta B Pucilowska, MD  emtricitabine-tenofovir AF (DESCOVY) 200-25 MG tablet Take 1 tablet by mouth daily. Patient not taking: Reported on 02/17/2017 10/29/16   Shari Prows, MD  ergocalciferol (VITAMIN D2) 50000 units capsule Take 50,000 Units by mouth once a week.    Historical Provider, MD  gabapentin (NEURONTIN) 300 MG capsule Take 1 capsule (300 mg total) by mouth 3 (three) times daily. Patient not taking: Reported on 02/17/2017 10/29/16    Shari Prows, MD    Allergies Lactose and Pollen extract  No family history on file.  Social History Social History  Substance Use Topics  . Smoking status: Current Every Day Smoker    Types: Cigarettes  . Smokeless tobacco: Never Used  . Alcohol use No    Review of Systems Constitutional: No fever/chills Eyes: No visual changes. ENT: No sore throat. Cardiovascular: Denies chest pain. Respiratory: Denies shortness of breath. Gastrointestinal: No abdominal pain.  No nausea, no vomiting.  No diarrhea.  No constipation. Genitourinary: Negative for dysuria. Musculoskeletal: Negative for back pain. Skin: Negative for rash. Neurological: Negative for headaches, focal weakness or numbness. Psych: Suicidal ideation  10-point ROS otherwise negative.  ____________________________________________   PHYSICAL EXAM:  VITAL SIGNS: ED Triage Vitals  Enc Vitals Group     BP 02/19/17 0307 (!) 170/109     Pulse Rate 02/19/17 0307 71     Resp 02/19/17 0307 20     Temp 02/19/17 0307 98.4 F (36.9 C)     Temp Source 02/19/17 0307 Oral     SpO2 02/19/17 0307 99 %     Weight 02/19/17 0307 212 lb (96.2 kg)     Height 02/19/17 0307 5\' 9"  (1.753 m)     Head Circumference --      Peak Flow --      Pain Score 02/19/17 0308 10     Pain Loc --      Pain Edu? --      Excl. in GC? --     Constitutional: Alert and oriented. Well appearing and in no acute distress. Eyes: Conjunctivae are normal. PERRL. EOMI. Head: Atraumatic. Nose: No congestion/rhinnorhea. Mouth/Throat: Mucous membranes are moist.  Oropharynx non-erythematous. Cardiovascular: Normal rate, regular rhythm. Grossly normal heart sounds.  Good peripheral circulation. Respiratory: Normal respiratory effort.  No retractions. Lungs CTAB. Gastrointestinal: Soft and nontender. No distention. Positive bowel sounds Musculoskeletal: No lower extremity tenderness nor edema.  . Neurologic:  Normal speech and language.    Skin:  Skin is warm, dry and intact.  Psychiatric: Mood and affect are normal.   ____________________________________________   LABS (all labs ordered are listed, but only abnormal results are displayed)  Labs Reviewed  COMPREHENSIVE METABOLIC PANEL - Abnormal; Notable for the following:       Result Value   Total Protein 8.3 (*)    AST 45 (*)    All other components within normal limits  ACETAMINOPHEN LEVEL - Abnormal; Notable for the following:    Acetaminophen (Tylenol), Serum <10 (*)    All other components within normal limits  URINE DRUG SCREEN, QUALITATIVE ([facility] ONLY) - Abnormal; Notable for the following:    Cocaine Metabolite,Ur [place] POSITIVE (*)    All other components within normal limits  ETHANOL  SALICYLATE LEVEL  CBC   ____________________________________________  EKG  none ____________________________________________  RADIOLOGY  none ____________________________________________   PROCEDURES  Procedure(s) performed: None  Procedures  Critical  Care performed: No  ____________________________________________   INITIAL IMPRESSION / ASSESSMENT AND PLAN / ED COURSE  Pertinent labs & imaging results that were available during my care of the patient were reviewed by me and considered in my medical decision making (see chart for details).  This is a 51 year old male who comes into the hospital today with suicidal ideation. The patient was using cocaine which was the trigger to these symptoms. The patient has been seen in the hospital previously for this. I will have the patient seen by psych for further evaluation.      ____________________________________________   FINAL CLINICAL IMPRESSION(S) / ED DIAGNOSES  Final diagnoses:  Suicidal ideation  Cocaine abuse      NEW MEDICATIONS STARTED DURING THIS VISIT:  New Prescriptions   No medications on file     Note:  This document was prepared using Dragon voice recognition software and may  include unintentional dictation errors.    Rebecka Apley, MD 02/19/17 [phone]

## 2017-02-19 NOTE — BH Assessment (Signed)
Assessment Note  Roy Aber Sr. is an 51 y.o. male.  [name] reports to the ED due to suicidal thoughts.  He reports that he "came back from [facility], and failed", He states that he started doing drugs again.  He shared "I feel like I let everybody down".  He shared that he is feeling depressed and reported, "I feel unsafe and nervous".  He shared that he is further depressed due to his body breaking down and arthritis in his knee. He expressed having a difficult time since his mother passed away.  He states that he lost his place to stay, and he is now homeless. He denied symptoms of anxiety.  He denied auditory or visual hallucinations.  He denied homicidal ideation or intent. He reports that he has been using cocaine.  Diagnosis: Depression, SI, Cocaine Misuse  Past Medical History:  Past Medical History:  Diagnosis Date  . AIDS (acquired immune deficiency syndrome) (HCC)   . Arthritis   . Asthma   . Bipolar disorder (HCC)   . Bronchitis   . Depression   . GERD (gastroesophageal reflux disease)   . Hepatitis C   . HIV (human immunodeficiency virus infection) (HCC)   . HTN (hypertension)     Past Surgical History:  Procedure Laterality Date  . HERNIA REPAIR    . TOE SURGERY      Family History: No family history on file.  Social History:  reports that he has been smoking Cigarettes.  He has never used smokeless tobacco. He reports that he uses drugs, including Cocaine. He reports that he does not drink alcohol.  Additional Social History:  Alcohol / Drug Use History of alcohol / drug use?: Yes Substance #1 Name of Substance 1: Cocaine 1 - Age of First Use: 23 1 - Amount (size/oz): Unknown 1 - Frequency: daily 1 - Last Use / Amount: 02/19/2017  CIWA: CIWA-Ar BP: (!) 170/109 Pulse Rate: 71 COWS:    Allergies:  Allergies  Allergen Reactions  . Lactose Other (See Comments)    GI distress  . Pollen Extract Itching    Home Medications:  (Not in a  hospital admission)  OB/GYN Status:  No LMP for male patient.  General Assessment Data Location of Assessment: [facility] ED TTS Assessment: In system Is this a Tele or Face-to-Face Assessment?: Face-to-Face Is this an Initial Assessment or a Re-assessment for this encounter?: Initial Assessment Marital status: Single Maiden name: n/a Is patient pregnant?: No Pregnancy Status: No Living Arrangements: Alone Can pt return to current living arrangement?: Yes Admission Status: Voluntary Is patient capable of signing voluntary admission?: Yes Referral Source: Self/Family/Friend Insurance type: Medicaid  Medical Screening Exam [facility] Walk-in ONLY) Medical Exam completed: Yes  Crisis Care Plan Living Arrangements: Alone Legal Guardian: Other: (Self) Name of Psychiatrist: None at this time Name of Therapist: None at this time (Participates with [facility] at [facility])  Education Status Is patient currently in school?: No Current Grade: n/a Highest grade of school patient has completed: 5th Name of school: [employer] person: n/a  Risk to self with the past 6 months Suicidal Ideation: Yes-Currently Present Has patient been a risk to self within the past 6 months prior to admission? : Yes Suicidal Intent: No-Not Currently/Within Last 6 Months Has patient had any suicidal intent within the past 6 months prior to admission? : Yes Is patient at risk for suicide?: No (Currently in the hospital) Suicidal Plan?: Yes-Currently Present Has patient had any suicidal  plan within the past 6 months prior to admission? : Yes Specify Current Suicidal Plan: Overdose on medication Access to Means: Yes Specify Access to Suicidal Means: patient has access to his medications What has been your use of drugs/alcohol within the last 12 months?: daily use of cocaine Previous Attempts/Gestures: Yes How many times?: 1 Other Self Harm Risks: denied Triggers for Past Attempts: Unknown Intentional  Self Injurious Behavior: None Family Suicide History: No Recent stressful life event(s): Financial Problems (loss of home, relapse) Persecutory voices/beliefs?: No Depression: Yes Depression Symptoms: Despondent Substance abuse history and/or treatment for substance abuse?: Yes Suicide prevention information given to non-admitted patients: Not applicable  Risk to Others within the past 6 months Homicidal Ideation: No Does patient have any lifetime risk of violence toward others beyond the six months prior to admission? : No Thoughts of Harm to Others: No Current Homicidal Intent: No Current Homicidal Plan: No Access to Homicidal Means: No Identified Victim: None identified History of harm to others?: No Assessment of Violence: None Noted Does patient have access to weapons?: No Criminal Charges Pending?: Yes Describe Pending Criminal Charges: Shoplifting, Breaking and Entering, misdomeanor larceny Does patient have a court date: Yes (April 10, 2017) Court Date: 02/28/17 Is patient on probation?: No  Psychosis Hallucinations: None noted Delusions: None noted  Mental Status Report Appearance/Hygiene: In scrubs Eye Contact: Fair Motor Activity: Unremarkable Speech: Logical/coherent Level of Consciousness: Alert Mood: Depressed Affect: Sad Anxiety Level: None Thought Processes: Coherent Judgement: Unimpaired Orientation: Person, Place, Time, Situation Obsessive Compulsive Thoughts/Behaviors: None  Cognitive Functioning Concentration: Normal Memory: Recent Intact IQ: Average Insight: Fair Impulse Control: Fair Appetite: Good Sleep: Decreased Vegetative Symptoms: None  ADLScreening [facility] Assessment Services) [name] cognitive ability adequate to safely complete daily activities?: Yes Patient able to express need for assistance with ADLs?: Yes Independently performs ADLs?: Yes (appropriate for developmental age)  Prior Inpatient Therapy Prior Inpatient Therapy:  Yes Prior Therapy Dates: 2017 Prior Therapy Facilty/Provider(s): [facility], [facility], [facility] Reason for Treatment: Depression, Substance abuse  Prior Outpatient Therapy Prior Outpatient Therapy: Yes Prior Therapy Dates: Current Prior Therapy Facilty/Provider(s): [facility] Reason for Treatment: Depression Does patient have an ACCT team?: No Does patient have Intensive In-House Services?  : No Does patient have [facility] services? : No Does patient have P4CC services?: No  ADL Screening (condition at time of admission) [name] cognitive ability adequate to safely complete daily activities?: Yes Patient able to express need for assistance with ADLs?: Yes Independently performs ADLs?: Yes (appropriate for developmental age)       Abuse/Neglect Assessment (Assessment to be complete while patient is alone) Physical Abuse: Denies Verbal Abuse: Denies Sexual Abuse: Denies Exploitation of patient/patient's resources: Denies Self-Neglect: Denies     Merchant navy officer (For Healthcare) Does Patient Have a Medical Advance Directive?: No Would patient like information on creating a medical advance directive?: No - Patient declined    Additional Information 1:1 In Past 12 Months?: No CIRT Risk: No Elopement Risk: No Does patient have medical clearance?: Yes     Disposition:  Disposition Initial Assessment Completed for this Encounter: Yes Disposition of Patient: Other dispositions  On Site Evaluation by:   Reviewed with Physician:    Justice Deeds 02/19/2017 6:05 AM

## 2017-02-19 NOTE — ED Notes (Signed)
Pt  Seen  By  Dr  Toni Amend  Pending  placement

## 2017-02-19 NOTE — ED Triage Notes (Signed)
Pt presents to ED with suicidal ideation. Denies recent attempt. Pt states he wants to take a "bunch of pills". Reports "using dope" yesterday and feeling depressed.

## 2017-02-19 NOTE — Consult Note (Signed)
[facility] Consult   Reason for Consult:  Consult for 52 year old man with a history of substance abuse and depression seen this morning in the emergency room Referring Physician:  Jimmye Norman Patient Identification: Roy Payment Rudder Sr. MRN:  [id number] Principal Diagnosis: Substance induced mood disorder ([place]) Diagnosis:   Patient Active Problem List   Diagnosis Date Noted  . Cocaine abuse [F14.10] 02/19/2017  . Substance induced mood disorder ([place]) [F19.94] 02/19/2017  . Tobacco use disorder [F17.200] 10/26/2016  . HIV disease ([place]) [B20] 10/26/2016  . HTN (hypertension) [I10] 10/26/2016  . Dyslipidemia [E78.5] 10/26/2016  . BPH (benign prostatic hyperplasia) [N40.0] 10/26/2016  . Constipation [K59.00] 10/26/2016  . Severe recurrent major depression without psychotic features ([place]) [F33.2] 10/25/2016    Total Time spent with patient: 1 hour  Subjective:   Roy Guild Sr. is a 51 y.o. male patient admitted with "really it's my leg hurting".  HPI:  Patient interviewed chart reviewed. 51 year old man with a history of substance abuse depression HIV and leg pain presented to the emergency room this time complaining of suicidal thinking. He had just been in the emergency room the previous day. At that time he had not been discussing suicidal thoughts. Was discharged with an outpatient plan and it sounds like he immediately fell and with some people that started him smoking cocaine. Smokes and cocaine and had been off of it for about a month before that. As soon as he did so he said he started having thoughts about killing himself. Doesn't have a specific plan. Talks vaguely about "taking a bunch of pills". Patient is able to articulate and discuss plans for the future. Does not appear to actually want to die. He is able to discuss the fact that he has really no place safe to stay at the moment. He was planning to move into his deceased mother's home in [place] but  it's not ready for living yet there are no utilities and he has run out of money. She has not been taking his medicines for the last couple days.  Social history: Had been living with his brother for a month or so but just left there. During that time he was staying clean. He says he was planning to go to [place] where his late mother's home is a vacant however that plan is not yet ready. On further questioning however it turns out that he actually is going to have to serve a year in jail starting in April. He tells me that mostly he just wants to make it until he can get his next check so that he'll level some money when he gets out of jail.  Medical history: Patient is HIV positive and is followed at the [facility] infectious disease clinic. Planes of chronic leg pain.  Substance abuse history: Long history of cocaine abuse. Most of his time sober is been when he was either in a contained setting or living with family. Has been in some substance abuse treatment in the past. Has a distant history of being atrocious but only completed a few months of it.  Past Psychiatric History: Past history of suicidal threats. No suicide attempts. Has been on medicine in the past including Celexa and Abilify which she gets prescribed by his infectious disease doctors.  Risk to Self: Suicidal Ideation: Yes-Currently Present Suicidal Intent: No-Not Currently/Within Last 6 Months Is patient at risk for suicide?: No (Currently in the hospital) Suicidal Plan?: Yes-Currently Present Specify Current Suicidal Plan: Overdose  on medication Access to Means: Yes Specify Access to Suicidal Means: patient has access to his medications What has been your use of drugs/alcohol within the last 12 months?: daily use of cocaine How many times?: 1 Other Self Harm Risks: denied Triggers for Past Attempts: Unknown Intentional Self Injurious Behavior: None Risk to Others: Homicidal Ideation: No Thoughts of Harm to Others:  No Current Homicidal Intent: No Current Homicidal Plan: No Access to Homicidal Means: No Identified Victim: None identified History of harm to others?: No Assessment of Violence: None Noted Does patient have access to weapons?: No Criminal Charges Pending?: Yes Describe Pending Criminal Charges: Shoplifting, Breaking and Entering, misdomeanor larceny Does patient have a court date: Yes (April 10, 2017) Court Date: 02/28/17 Prior Inpatient Therapy: Prior Inpatient Therapy: Yes Prior Therapy Dates: 2017 Prior Therapy Facilty/Provider(s): [facility], [facility], [facility] Reason for Treatment: Depression, Substance abuse Prior Outpatient Therapy: Prior Outpatient Therapy: Yes Prior Therapy Dates: Current Prior Therapy Facilty/Provider(s): [facility] Reason for Treatment: Depression Does patient have an ACCT team?: No Does patient have Intensive In-House Services?  : No Does patient have [facility] services? : No Does patient have P4CC services?: No  Past Medical History:  Past Medical History:  Diagnosis Date  . AIDS (acquired immune deficiency syndrome) (HCC)   . Arthritis   . Asthma   . Bipolar disorder (HCC)   . Bronchitis   . Depression   . GERD (gastroesophageal reflux disease)   . Hepatitis C   . HIV (human immunodeficiency virus infection) (HCC)   . HTN (hypertension)     Past Surgical History:  Procedure Laterality Date  . HERNIA REPAIR    . TOE SURGERY     Family History: No family history on file. Family Psychiatric  History: None Social History:  History  Alcohol Use No     History  Drug Use  . Types: Cocaine    Social History   Social History  . Marital status: Divorced    Spouse name: N/A  . Number of children: N/A  . Years of education: N/A   Social History Main Topics  . Smoking status: Current Every Day Smoker    Types: Cigarettes  . Smokeless tobacco: Never Used  . Alcohol use No  . Drug use: Yes    Types: Cocaine  . Sexual activity:  Yes   Other Topics Concern  . Not on file   Social History Narrative  . No narrative on file   Additional Social History:    Allergies:   Allergies  Allergen Reactions  . Lactose Other (See Comments)    GI distress  . Pollen Extract Itching    Labs:  Results for orders placed or performed during the hospital encounter of 02/19/17 (from the past 48 hour(s))  Comprehensive metabolic panel     Status: Abnormal   Collection Time: 02/19/17  3:13 AM  Result Value Ref Range   Sodium 137 135 - 145 mmol/L   Potassium 3.7 3.5 - 5.1 mmol/L   Chloride 105 101 - 111 mmol/L   CO2 25 22 - 32 mmol/L   Glucose, Bld 96 65 - 99 mg/dL   BUN 13 6 - 20 mg/dL   Creatinine, Ser 0.68 0.61 - 1.24 mg/dL   Calcium 9.1 8.9 - 40.3 mg/dL   Total Protein 8.3 (H) 6.5 - 8.1 g/dL   Albumin 4.3 3.5 - 5.0 g/dL   AST 45 (H) 15 - 41 U/L   ALT 55 17 - 63  U/L   Alkaline Phosphatase 66 38 - 126 U/L   Total Bilirubin 1.0 0.3 - 1.2 mg/dL   GFR calc non Af Amer >60 >60 mL/min   GFR calc Af Amer >60 >60 mL/min    Comment: (NOTE) The eGFR has been calculated using the CKD EPI equation. This calculation has not been validated in all clinical situations. eGFR's persistently <60 mL/min signify possible Chronic Kidney Disease.    Anion gap 7 5 - 15  Ethanol     Status: None   Collection Time: 02/19/17  3:13 AM  Result Value Ref Range   Alcohol, Ethyl (B) <5 <5 mg/dL    Comment:        LOWEST DETECTABLE LIMIT FOR SERUM ALCOHOL IS 5 mg/dL FOR MEDICAL PURPOSES ONLY   Salicylate level     Status: None   Collection Time: 02/19/17  3:13 AM  Result Value Ref Range   Salicylate Lvl <4.5 2.8 - 30.0 mg/dL  Acetaminophen level     Status: Abnormal   Collection Time: 02/19/17  3:13 AM  Result Value Ref Range   Acetaminophen (Tylenol), Serum <10 (L) 10 - 30 ug/mL    Comment:        THERAPEUTIC CONCENTRATIONS VARY SIGNIFICANTLY. A RANGE OF 10-30 ug/mL MAY BE AN EFFECTIVE CONCENTRATION FOR MANY PATIENTS. HOWEVER,  SOME ARE BEST TREATED AT CONCENTRATIONS OUTSIDE THIS RANGE. ACETAMINOPHEN CONCENTRATIONS >150 ug/mL AT 4 HOURS AFTER INGESTION AND >50 ug/mL AT 12 HOURS AFTER INGESTION ARE OFTEN ASSOCIATED WITH TOXIC REACTIONS.   cbc     Status: None   Collection Time: 02/19/17  3:13 AM  Result Value Ref Range   WBC 5.2 3.8 - 10.6 K/uL   RBC 4.61 4.40 - 5.90 MIL/uL   Hemoglobin 15.2 13.0 - 18.0 g/dL   HCT 43.9 40.0 - 52.0 %   MCV 95.3 80.0 - 100.0 fL   MCH 33.0 26.0 - 34.0 pg   MCHC 34.6 32.0 - 36.0 g/dL   RDW 13.8 11.5 - 14.5 %   Platelets 187 150 - 440 K/uL  Urine Drug Screen, Qualitative     Status: Abnormal   Collection Time: 02/19/17  3:13 AM  Result Value Ref Range   Tricyclic, Ur Screen NONE DETECTED NONE DETECTED   Amphetamines, Ur Screen NONE DETECTED NONE DETECTED   MDMA (Ecstasy)Ur Screen NONE DETECTED NONE DETECTED   Cocaine Metabolite,Ur [place] POSITIVE (A) NONE DETECTED   Opiate, Ur Screen NONE DETECTED NONE DETECTED   Phencyclidine (PCP) Ur S NONE DETECTED NONE DETECTED   Cannabinoid 50 Ng, Ur [place] NONE DETECTED NONE DETECTED   Barbiturates, Ur Screen NONE DETECTED NONE DETECTED   Benzodiazepine, Ur Scrn NONE DETECTED NONE DETECTED   Methadone Scn, Ur NONE DETECTED NONE DETECTED    Comment: (NOTE) 997  Tricyclics, urine               Cutoff 1000 ng/mL 200  Amphetamines, urine             Cutoff 1000 ng/mL 300  MDMA (Ecstasy), urine           Cutoff 500 ng/mL 400  Cocaine Metabolite, urine       Cutoff 300 ng/mL 500  Opiate, urine                   Cutoff 300 ng/mL 600  Phencyclidine (PCP), urine      Cutoff 25 ng/mL 700  Cannabinoid, urine  Cutoff 50 ng/mL 800  Barbiturates, urine             Cutoff 200 ng/mL 900  Benzodiazepine, urine           Cutoff 200 ng/mL 1000 Methadone, urine                Cutoff 300 ng/mL 1100 1200 The urine drug screen provides only a preliminary, unconfirmed 1300 analytical test result and should not be used for non-medical 1400  purposes. Clinical consideration and professional judgment should 1500 be applied to any positive drug screen result due to possible 1600 interfering substances. A more specific alternate chemical method 1700 must be used in order to obtain a confirmed analytical result.  1800 Gas chromato graphy / mass spectrometry (GC/MS) is the preferred 1900 confirmatory method.     Current Facility-Administered Medications  Medication Dose Route Frequency Provider Last Rate Last Dose  . acetaminophen (TYLENOL) tablet 1,000 mg  1,000 mg Oral Q6H PRN Earleen Newport, MD   1,000 mg at 02/19/17 1128   Current Outpatient Prescriptions  Medication Sig Dispense Refill  . acetaminophen (TYLENOL) 500 MG tablet Take 500 mg by mouth every 8 (eight) hours as needed.    Marland Kitchen albuterol (PROVENTIL HFA;VENTOLIN HFA) 108 (90 Base) MCG/ACT inhaler Inhale 2 puffs into the lungs every 4 (four) hours as needed for shortness of breath. 1 Inhaler 0  . amLODipine (NORVASC) 5 MG tablet Take 1 tablet (5 mg total) by mouth daily. 30 tablet 0  . aspirin EC 81 MG tablet Take 1 tablet (81 mg total) by mouth daily. 30 tablet 0  . atorvastatin (LIPITOR) 40 MG tablet Take 1 tablet (40 mg total) by mouth daily at 6 PM. 30 tablet 0  . citalopram (CELEXA) 20 MG tablet Take 1 tablet (20 mg total) by mouth daily. 30 tablet 0  . docusate sodium (COLACE) 100 MG capsule Take 1 capsule (100 mg total) by mouth 2 (two) times daily. 60 capsule 0  . dolutegravir (TIVICAY) 50 MG tablet Take 1 tablet (50 mg total) by mouth daily. 30 tablet 0  . lisinopril (PRINIVIL,ZESTRIL) 20 MG tablet Take 1 tablet (20 mg total) by mouth daily. 30 tablet 0  . meloxicam (MOBIC) 7.5 MG tablet Take 1 tablet (7.5 mg total) by mouth daily. With food 14 tablet 2  . polyethylene glycol (MIRALAX / GLYCOLAX) packet Take 17 g by mouth daily. 14 each 0  . predniSONE (DELTASONE) 10 MG tablet Take 1 tablet (10 mg total) by mouth daily. 6,5,4,3,2,1 six day taper 21 tablet 0   . tamsulosin (FLOMAX) 0.4 MG CAPS capsule Take 1 capsule (0.4 mg total) by mouth daily after breakfast. 30 capsule 0  . emtricitabine-tenofovir AF (DESCOVY) 200-25 MG tablet Take 1 tablet by mouth daily. (Patient not taking: Reported on 02/17/2017) 30 tablet 0  . ergocalciferol (VITAMIN D2) 50000 units capsule Take 50,000 Units by mouth once a week.    . gabapentin (NEURONTIN) 300 MG capsule Take 1 capsule (300 mg total) by mouth 3 (three) times daily. (Patient not taking: Reported on 02/17/2017) 90 capsule 0    Musculoskeletal: Strength & Muscle Tone: within normal limits Gait & Station: normal Patient leans: N/A  Psychiatric Specialty Exam: Physical Exam  Nursing note and vitals reviewed. Constitutional: He appears well-developed and well-nourished.  HENT:  Head: Normocephalic and atraumatic.  Eyes: Conjunctivae are normal. Pupils are equal, round, and reactive to light.  Neck: Normal range of motion.  Cardiovascular: Regular rhythm and  normal heart sounds.   Respiratory: Effort normal. No respiratory distress.  GI: Soft.  Musculoskeletal: Normal range of motion.  Neurological: He is alert.  Skin: Skin is warm and dry.  Psychiatric: His speech is delayed. He is slowed. Cognition and memory are normal. He expresses impulsivity. He exhibits a depressed mood. He expresses suicidal ideation. He expresses no suicidal plans.    Review of Systems  Constitutional: Negative.   HENT: Negative.   Eyes: Negative.   Respiratory: Negative.   Cardiovascular: Negative.   Gastrointestinal: Negative.   Musculoskeletal: Positive for joint pain.  Skin: Negative.   Neurological: Negative.   Psychiatric/Behavioral: Positive for depression, substance abuse and suicidal ideas. Negative for hallucinations and memory loss. The patient is nervous/anxious and has insomnia.     Blood pressure (!) 142/76, pulse (!) 52, temperature 98.4 F (36.9 C), temperature source Oral, resp. rate 16, height '5\' 9"'$   (1.753 m), weight 96.2 kg (212 lb), SpO2 98 %.Body mass index is 31.31 kg/m.  General Appearance: Casual  Eye Contact:  Fair  Speech:  Normal Rate  Volume:  Normal  Mood:  Depressed  Affect:  Congruent  Thought Process:  Goal Directed  Orientation:  Full (Time, Place, and Person)  Thought Content:  Logical  Suicidal Thoughts:  Yes.  without intent/plan  Homicidal Thoughts:  No  Memory:  Immediate;   Good Recent;   Fair Remote;   Fair  Judgement:  Impaired  Insight:  Shallow  Psychomotor Activity:  Decreased  Concentration:  Concentration: Fair  Recall:  [employer] of Knowledge:  Fair  Language:  Fair  Akathisia:  No  Handed:  Right  AIMS (if indicated):     Assets:  Communication Skills Desire for Improvement  ADL's:  Intact  Cognition:  WNL  Sleep:        Treatment Plan Summary: Daily contact with patient to assess and evaluate symptoms and progress in treatment and Plan 51 year old man presented to the emergency room with vague suicidal statements and seemed to mostly be related to the fact that he has no place to live for the moment and spent all his money on cocaine. Patient has minimal resources at this time. We did not discharge him from the emergency room today as I was concerned about where he would go to stay but he doesn't clearly require inpatient treatment. He will be reevaluated tomorrow. Supportive counseling and review of the need for him to stay off of his cocaine completed.  Disposition: No evidence of imminent risk to self or others at present.   Supportive therapy provided about ongoing stressors.  Alethia Berthold, MD 02/19/2017 6:18 PM

## 2017-02-20 ENCOUNTER — Inpatient Hospital Stay
Admission: EM | Admit: 2017-02-20 | Discharge: 2017-02-27 | DRG: 885 | Disposition: A | Discharge status: Home / Self Care | Payer: Medicaid Other | Source: Intra-hospital | Attending: Psychiatry | Admitting: Psychiatry

## 2017-02-20 DIAGNOSIS — F332 Major depressive disorder, recurrent severe without psychotic features: Principal | ICD-10-CM | POA: Diagnosis present

## 2017-02-20 DIAGNOSIS — Z9114 Patient's other noncompliance with medication regimen: Secondary | ICD-10-CM

## 2017-02-20 DIAGNOSIS — Z590 Homelessness: Secondary | ICD-10-CM

## 2017-02-20 DIAGNOSIS — M1990 Unspecified osteoarthritis, unspecified site: Secondary | ICD-10-CM | POA: Diagnosis present

## 2017-02-20 DIAGNOSIS — I10 Essential (primary) hypertension: Secondary | ICD-10-CM | POA: Diagnosis present

## 2017-02-20 DIAGNOSIS — F1410 Cocaine abuse, uncomplicated: Secondary | ICD-10-CM | POA: Diagnosis not present

## 2017-02-20 DIAGNOSIS — E785 Hyperlipidemia, unspecified: Secondary | ICD-10-CM | POA: Diagnosis present

## 2017-02-20 DIAGNOSIS — G4700 Insomnia, unspecified: Secondary | ICD-10-CM | POA: Diagnosis present

## 2017-02-20 DIAGNOSIS — K5900 Constipation, unspecified: Secondary | ICD-10-CM | POA: Diagnosis present

## 2017-02-20 DIAGNOSIS — Z9889 Other specified postprocedural states: Secondary | ICD-10-CM | POA: Diagnosis not present

## 2017-02-20 DIAGNOSIS — Z7982 Long term (current) use of aspirin: Secondary | ICD-10-CM | POA: Diagnosis not present

## 2017-02-20 DIAGNOSIS — R45851 Suicidal ideations: Secondary | ICD-10-CM | POA: Diagnosis present

## 2017-02-20 DIAGNOSIS — J449 Chronic obstructive pulmonary disease, unspecified: Secondary | ICD-10-CM | POA: Diagnosis present

## 2017-02-20 DIAGNOSIS — F1994 Other psychoactive substance use, unspecified with psychoactive substance-induced mood disorder: Secondary | ICD-10-CM | POA: Diagnosis present

## 2017-02-20 DIAGNOSIS — B20 Human immunodeficiency virus [HIV] disease: Secondary | ICD-10-CM | POA: Diagnosis present

## 2017-02-20 DIAGNOSIS — F17210 Nicotine dependence, cigarettes, uncomplicated: Secondary | ICD-10-CM | POA: Diagnosis present

## 2017-02-20 DIAGNOSIS — Z915 Personal history of self-harm: Secondary | ICD-10-CM

## 2017-02-20 DIAGNOSIS — Z79899 Other long term (current) drug therapy: Secondary | ICD-10-CM | POA: Diagnosis not present

## 2017-02-20 DIAGNOSIS — N400 Enlarged prostate without lower urinary tract symptoms: Secondary | ICD-10-CM | POA: Diagnosis present

## 2017-02-20 DIAGNOSIS — K219 Gastro-esophageal reflux disease without esophagitis: Secondary | ICD-10-CM | POA: Diagnosis present

## 2017-02-20 DIAGNOSIS — F1420 Cocaine dependence, uncomplicated: Secondary | ICD-10-CM | POA: Diagnosis present

## 2017-02-20 DIAGNOSIS — Z888 Allergy status to other drugs, medicaments and biological substances status: Secondary | ICD-10-CM | POA: Diagnosis not present

## 2017-02-20 MED ORDER — LISINOPRIL 20 MG PO TABS
20.0000 mg | ORAL_TABLET | Freq: Every day | ORAL | Status: DC
  Administered 2017-02-20: 20 mg via ORAL
  Filled 2017-02-20: qty 1

## 2017-02-20 MED ORDER — DOLUTEGRAVIR SODIUM 50 MG PO TABS
50.0000 mg | ORAL_TABLET | Freq: Every day | ORAL | Status: DC
  Administered 2017-02-21 – 2017-02-27 (×7): 50 mg via ORAL
  Filled 2017-02-20 (×7): qty 1

## 2017-02-20 MED ORDER — AMLODIPINE BESYLATE 5 MG PO TABS
5.0000 mg | ORAL_TABLET | Freq: Every day | ORAL | Status: DC
  Administered 2017-02-20: 5 mg via ORAL
  Filled 2017-02-20: qty 1

## 2017-02-20 MED ORDER — INFLUENZA VAC SPLIT QUAD 0.5 ML IM SUSY
0.5000 mL | PREFILLED_SYRINGE | INTRAMUSCULAR | Status: AC
  Administered 2017-02-21: 0.5 mL via INTRAMUSCULAR
  Filled 2017-02-20: qty 0.5

## 2017-02-20 MED ORDER — TAMSULOSIN HCL 0.4 MG PO CAPS
0.4000 mg | ORAL_CAPSULE | Freq: Every day | ORAL | Status: DC
  Administered 2017-02-20: 0.4 mg via ORAL
  Filled 2017-02-20: qty 1

## 2017-02-20 MED ORDER — AMLODIPINE BESYLATE 5 MG PO TABS
5.0000 mg | ORAL_TABLET | Freq: Every day | ORAL | Status: DC
  Administered 2017-02-21 – 2017-02-27 (×7): 5 mg via ORAL
  Filled 2017-02-20 (×7): qty 1

## 2017-02-20 MED ORDER — GABAPENTIN 300 MG PO CAPS
300.0000 mg | ORAL_CAPSULE | Freq: Three times a day (TID) | ORAL | Status: DC
  Administered 2017-02-21 – 2017-02-23 (×7): 300 mg via ORAL
  Filled 2017-02-20 (×7): qty 1

## 2017-02-20 MED ORDER — CITALOPRAM HYDROBROMIDE 20 MG PO TABS
20.0000 mg | ORAL_TABLET | Freq: Every day | ORAL | Status: DC
  Administered 2017-02-20 – 2017-02-24 (×5): 20 mg via ORAL
  Filled 2017-02-20 (×5): qty 1

## 2017-02-20 MED ORDER — MAGNESIUM HYDROXIDE 400 MG/5ML PO SUSP
30.0000 mL | Freq: Every day | ORAL | Status: DC | PRN
  Administered 2017-02-25: 30 mL via ORAL
  Filled 2017-02-20: qty 30

## 2017-02-20 MED ORDER — ASPIRIN EC 81 MG PO TBEC
81.0000 mg | DELAYED_RELEASE_TABLET | Freq: Every day | ORAL | Status: DC
  Administered 2017-02-21 – 2017-02-27 (×7): 81 mg via ORAL
  Filled 2017-02-20 (×7): qty 1

## 2017-02-20 MED ORDER — DOLUTEGRAVIR SODIUM 50 MG PO TABS
50.0000 mg | ORAL_TABLET | Freq: Every day | ORAL | Status: DC
  Filled 2017-02-20: qty 1

## 2017-02-20 MED ORDER — OXYMETAZOLINE HCL 0.05 % NA SOLN
1.0000 | Freq: Two times a day (BID) | NASAL | Status: DC | PRN
  Administered 2017-02-20: 1 via NASAL
  Filled 2017-02-20: qty 15

## 2017-02-20 MED ORDER — GABAPENTIN 300 MG PO CAPS
300.0000 mg | ORAL_CAPSULE | Freq: Three times a day (TID) | ORAL | Status: DC
  Administered 2017-02-20: 300 mg via ORAL
  Filled 2017-02-20: qty 1

## 2017-02-20 MED ORDER — PNEUMOCOCCAL VAC POLYVALENT 25 MCG/0.5ML IJ INJ
0.5000 mL | INJECTION | INTRAMUSCULAR | Status: AC
  Administered 2017-02-21: 0.5 mL via INTRAMUSCULAR
  Filled 2017-02-20: qty 0.5

## 2017-02-20 MED ORDER — ASPIRIN EC 81 MG PO TBEC
81.0000 mg | DELAYED_RELEASE_TABLET | Freq: Every day | ORAL | Status: DC
  Administered 2017-02-20: 81 mg via ORAL
  Filled 2017-02-20: qty 1

## 2017-02-20 MED ORDER — DOCUSATE SODIUM 100 MG PO CAPS
100.0000 mg | ORAL_CAPSULE | Freq: Two times a day (BID) | ORAL | Status: DC
  Administered 2017-02-20 – 2017-02-27 (×15): 100 mg via ORAL
  Filled 2017-02-20 (×15): qty 1

## 2017-02-20 MED ORDER — ALUM & MAG HYDROXIDE-SIMETH 200-200-20 MG/5ML PO SUSP
30.0000 mL | ORAL | Status: DC | PRN
  Administered 2017-02-24 – 2017-02-26 (×3): 30 mL via ORAL
  Filled 2017-02-20 (×3): qty 30

## 2017-02-20 MED ORDER — LISINOPRIL 10 MG PO TABS
20.0000 mg | ORAL_TABLET | Freq: Every day | ORAL | Status: DC
  Administered 2017-02-21 – 2017-02-27 (×7): 20 mg via ORAL
  Filled 2017-02-20 (×7): qty 2

## 2017-02-20 MED ORDER — EMTRICITABINE-TENOFOVIR AF 200-25 MG PO TABS
1.0000 | ORAL_TABLET | Freq: Every day | ORAL | Status: DC
  Administered 2017-02-21 – 2017-02-27 (×7): 1 via ORAL
  Filled 2017-02-20 (×7): qty 1

## 2017-02-20 MED ORDER — TAMSULOSIN HCL 0.4 MG PO CAPS
0.4000 mg | ORAL_CAPSULE | Freq: Every day | ORAL | Status: DC
  Administered 2017-02-20 – 2017-02-27 (×8): 0.4 mg via ORAL
  Filled 2017-02-20 (×8): qty 1

## 2017-02-20 MED ORDER — ALBUTEROL SULFATE HFA 108 (90 BASE) MCG/ACT IN AERS
2.0000 | INHALATION_SPRAY | RESPIRATORY_TRACT | Status: DC | PRN
  Filled 2017-02-20: qty 6.7

## 2017-02-20 MED ORDER — MELOXICAM 7.5 MG PO TABS
7.5000 mg | ORAL_TABLET | Freq: Every day | ORAL | Status: DC
  Administered 2017-02-20 – 2017-02-23 (×4): 7.5 mg via ORAL
  Filled 2017-02-20 (×4): qty 1

## 2017-02-20 MED ORDER — ACETAMINOPHEN 325 MG PO TABS
650.0000 mg | ORAL_TABLET | Freq: Four times a day (QID) | ORAL | Status: DC | PRN
  Administered 2017-02-21 – 2017-02-24 (×3): 650 mg via ORAL
  Filled 2017-02-20 (×3): qty 2

## 2017-02-20 NOTE — ED Notes (Signed)
Pt complaining of nasal congestion and only wants to use Afrin. RN spoke with ER MD and obtained orders. Medication administered as ordered.

## 2017-02-20 NOTE — ED Provider Notes (Signed)
Patient seen by psychiatry, recommends admission   Jene Every, MD 02/20/17 1601

## 2017-02-20 NOTE — ED Notes (Signed)
Pt irritable, reports he did not sleep well because he was "stuffy."  Pt informed he would be re-evaluated by psychiatrist and a disposition would be determined. Pt accepting. Pt voiced no further needs/concerns. Maintained on 15 minute checks and observation by security camera for safety.

## 2017-02-20 NOTE — ED Notes (Signed)
Pt will be admitted to BMU. Pt accepting. No concerns or needs at this time. Maintained on 15 minute checks and observation by security camera for safety.

## 2017-02-20 NOTE — BH Assessment (Signed)
Patient is to be admitted to [facility] by Dr. Toni Amend.  Attending Physician will be Dr. Jennet Maduro.   Patient has been assigned to room 320, by [facility] Charge Nurse Edwena Bunde.   Intake Paper Work has been signed and placed on patient chart.  ER staff is aware of the admission [name], ER Sect.; Dr. Cyril Loosen, ER MD; [name]., [name] Nurse & [name], Patient Access).

## 2017-02-20 NOTE — Tx Team (Signed)
Initial Treatment Plan 02/20/2017 6:49 PM Roy Liter Lamke Sr. VUY:[id number]    PATIENT STRESSORS: Financial difficulties Legal issue Substance abuse   PATIENT STRENGTHS: Motivation for treatment/growth Supportive family/friends   PATIENT IDENTIFIED PROBLEMS:   Increased depression, hopelessness, anxiety= suicidal ideation    "I used my whole check to buy cociane"-substance abuse    Homeless           DISCHARGE CRITERIA:  Ability to meet basic life and health needs Adequate post-discharge living arrangements Improved stabilization in mood, thinking, and/or behavior Medical problems require only outpatient monitoring Motivation to continue treatment in a less acute level of care Verbal commitment to aftercare and medication compliance Withdrawal symptoms are absent or subacute and managed without 24-hour nursing intervention  PRELIMINARY DISCHARGE PLAN: Outpatient therapy Placement in alternative living arrangements  PATIENT/FAMILY INVOLVEMENT: This treatment plan has been presented to and reviewed with the patient, Roy Zubia Sr..  The patient and family have been given the opportunity to ask questions and make suggestions.  Tonye Pearson, RN 02/20/2017, 6:49 PM

## 2017-02-20 NOTE — ED Notes (Signed)
Patient resting quietly in room. No noted distress or abnormal behaviors noted. Will continue 15 minute checks and observation by security camera for safety. 

## 2017-02-20 NOTE — Consult Note (Signed)
Roy [name]   Reason for [name]:  [name] for 51 year old man with a history of substance abuse and depression seen this morning in the emergency room Referring Physician:  Jimmye Norman Roy [name] Identification: Roy Payment Lofstrom Sr. MRN:  [id number] Principal Diagnosis: Substance induced mood disorder ([place]) Diagnosis:   Roy [name] Active Problem List   Diagnosis Date Noted  . Cocaine abuse [F14.10] 02/19/2017  . Substance induced mood disorder ([place]) [F19.94] 02/19/2017  . Tobacco use disorder [F17.200] 10/26/2016  . HIV disease ([place]) [B20] 10/26/2016  . HTN (hypertension) [I10] 10/26/2016  . Dyslipidemia [E78.5] 10/26/2016  . BPH (benign prostatic hyperplasia) [N40.0] 10/26/2016  . Constipation [K59.00] 10/26/2016  . Severe recurrent major depression without psychotic features ([place]) [F33.2] 10/25/2016    Total Time spent with Roy [name]: 20 minutes  Subjective:   Roy Guild Sr. is a 51 y.o. male Roy [name] admitted with "really it's my leg hurting".  Follow-up for 51 year old man with a history of substance abuse and depression. Reevaluated today. Roy [name] says that he still feels very bad. Still feels depressed and hopeless. Claims to still have active thoughts of killing himself. Affect blunted and flat. Roy [name] is withdrawn low activity. Compliant with treatment so far.  HPI:  Roy [name] interviewed chart reviewed. 51 year old man with a history of substance abuse depression HIV and leg pain presented to the emergency room this time complaining of suicidal thinking. He had just been in the emergency room the previous day. At that time he had not been discussing suicidal thoughts. Was discharged with an outpatient plan and it sounds like he immediately fell and with some people that started him smoking cocaine. Smokes and cocaine and had been off of it for about a month before that. As soon as he did so he said he started having thoughts about killing himself. Doesn't  have a specific plan. Talks vaguely about "taking a bunch of pills". Roy [name] is able to articulate and discuss plans for the future. Does not appear to actually want to die. He is able to discuss the fact that he has really no place safe to stay at the moment. He was planning to move into his deceased mother's home in [place] but it's not ready for living yet there are no utilities and he has run out of money. She has not been taking his medicines for the last couple days.  Social history: Had been living with his brother for a month or so but just left there. During that time he was staying clean. He says he was planning to go to [place] where his late mother's home is a vacant however that plan is not yet ready. On further questioning however it turns out that he actually is going to have to serve a year in jail starting in April. He tells me that mostly he just wants to make it until he can get his next check so that he'll level some money when he gets out of jail.  Medical history: Roy [name] is HIV positive and is followed at the [facility] infectious disease clinic. Planes of chronic leg pain.  Substance abuse history: Long history of cocaine abuse. Most of his time sober is been when he was either in a contained setting or living with family. Has been in some substance abuse treatment in the past. Has a distant history of being atrocious but only completed a few months of it.  Past Psychiatric History: Past history of suicidal threats. No suicide attempts. Has been on medicine  in the past including Celexa and Abilify which she gets prescribed by his infectious disease doctors.  Risk to Self: Suicidal Ideation: Yes-Currently Present Suicidal Intent: No-Not Currently/Within Last 6 Months [name]: No (Currently in the hospital) Suicidal Plan?: Yes-Currently Present Specify Current Suicidal Plan: Overdose on medication Access to Means: Yes Specify Access to Suicidal Means:  Roy [name] has access to his medications What has been your use of drugs/alcohol within the last 12 months?: daily use of cocaine How many times?: 1 Other Self Harm Risks: denied Triggers for Past Attempts: Unknown Intentional Self Injurious Behavior: None Risk to Others: Homicidal Ideation: No Thoughts of Harm to Others: No Current Homicidal Intent: No Current Homicidal Plan: No Access to Homicidal Means: No Identified Victim: None identified History of harm to others?: No Assessment of Violence: None Noted [name]: No Criminal Charges Pending?: Yes Describe Pending Criminal Charges: Shoplifting, Breaking and Entering, misdomeanor larceny [name]: Yes (April 10, 2017) Court Date: 02/28/17 Prior Inpatient Therapy: Prior Inpatient Therapy: Yes Prior Therapy Dates: 2017 Prior Therapy Facilty/Provider(s): [facility], [facility], [facility] Reason for Treatment: Depression, Substance abuse Prior Outpatient Therapy: Prior Outpatient Therapy: Yes Prior Therapy Dates: Current Prior Therapy Facilty/Provider(s): [facility] Reason for Treatment: Depression [name]: No [name]  : No [name] : No [name]: No  Past Medical History:  Past Medical History:  Diagnosis Date  . AIDS (acquired immune deficiency syndrome) ([place])   . Arthritis   . Asthma   . Bipolar disorder ([place])   . Bronchitis   . Depression   . GERD (gastroesophageal reflux disease)   . Hepatitis C   . HIV (human immunodeficiency virus infection) ([place])   . HTN (hypertension)     Past Surgical History:  Procedure Laterality Date  . HERNIA REPAIR    . TOE SURGERY     Family History: No family history on file. Family Psychiatric  History: None Social History:  History  Alcohol Use No     History  Drug Use  . Types: Cocaine    Social History    Social History  . Marital status: Divorced    Spouse name: N/A  . Number of children: N/A  . Years of education: N/A   Social History Main Topics  . Smoking status: Current Every Day Smoker    Types: Cigarettes  . Smokeless tobacco: Never Used  . Alcohol use No  . Drug use: Yes    Types: Cocaine  . Sexual activity: Yes   Other Topics Concern  . Not on file   Social History Narrative  . No narrative on file   Additional Social History:    Allergies:   Allergies  Allergen Reactions  . Lactose Other (See Comments)    GI distress  . Pollen Extract Itching    Labs:  Results for orders placed or performed during the hospital encounter of 02/19/17 (from the past 48 hour(s))  Comprehensive metabolic panel     Status: Abnormal   Collection Time: 02/19/17  3:13 AM  Result Value Ref Range   Sodium 137 135 - 145 mmol/L   Potassium 3.7 3.5 - 5.1 mmol/L   Chloride 105 101 - 111 mmol/L   CO2 25 22 - 32 mmol/L   Glucose, Bld 96 65 - 99 mg/dL   BUN 13 6 - 20 mg/dL  Creatinine, Ser 0.93 0.61 - 1.24 mg/dL   Calcium 9.1 8.9 - 10.3 mg/dL   Total Protein 8.3 (H) 6.5 - 8.1 g/dL   Albumin 4.3 3.5 - 5.0 g/dL   AST 45 (H) 15 - 41 U/L   ALT 55 17 - 63 U/L   Alkaline Phosphatase 66 38 - 126 U/L   Total Bilirubin 1.0 0.3 - 1.2 mg/dL   GFR calc non Af Amer >60 >60 mL/min   GFR calc Af Amer >60 >60 mL/min    Comment: (NOTE) The eGFR has been calculated using the CKD EPI equation. This calculation has not been validated in all clinical situations. eGFR's persistently <60 mL/min signify possible Chronic Kidney Disease.    Anion gap 7 5 - 15  Ethanol     Status: None   Collection Time: 02/19/17  3:13 AM  Result Value Ref Range   Alcohol, Ethyl (B) <5 <5 mg/dL    Comment:        LOWEST DETECTABLE LIMIT FOR SERUM ALCOHOL IS 5 mg/dL FOR MEDICAL PURPOSES ONLY   Salicylate level     Status: None   Collection Time: 02/19/17  3:13 AM  Result Value Ref Range   Salicylate Lvl <7.7  2.8 - 30.0 mg/dL  Acetaminophen level     Status: Abnormal   Collection Time: 02/19/17  3:13 AM  Result Value Ref Range   Acetaminophen (Tylenol), Serum <10 (L) 10 - 30 ug/mL    Comment:        THERAPEUTIC CONCENTRATIONS VARY SIGNIFICANTLY. A RANGE OF 10-30 ug/mL MAY BE AN EFFECTIVE CONCENTRATION FOR MANY PATIENTS. HOWEVER, SOME ARE BEST TREATED AT CONCENTRATIONS OUTSIDE THIS RANGE. ACETAMINOPHEN CONCENTRATIONS >150 ug/mL AT 4 HOURS AFTER INGESTION AND >50 ug/mL AT 12 HOURS AFTER INGESTION ARE OFTEN ASSOCIATED WITH TOXIC REACTIONS.   cbc     Status: None   Collection Time: 02/19/17  3:13 AM  Result Value Ref Range   WBC 5.2 3.8 - 10.6 K/uL   RBC 4.61 4.40 - 5.90 MIL/uL   Hemoglobin 15.2 13.0 - 18.0 g/dL   HCT 43.9 40.0 - 52.0 %   MCV 95.3 80.0 - 100.0 fL   MCH 33.0 26.0 - 34.0 pg   MCHC 34.6 32.0 - 36.0 g/dL   RDW 13.8 11.5 - 14.5 %   Platelets 187 150 - 440 K/uL  Urine Drug Screen, Qualitative     Status: Abnormal   Collection Time: 02/19/17  3:13 AM  Result Value Ref Range   Tricyclic, Ur Screen NONE DETECTED NONE DETECTED   Amphetamines, Ur Screen NONE DETECTED NONE DETECTED   MDMA (Ecstasy)Ur Screen NONE DETECTED NONE DETECTED   Cocaine Metabolite,Ur [place] POSITIVE (A) NONE DETECTED   Opiate, Ur Screen NONE DETECTED NONE DETECTED   Phencyclidine (PCP) Ur S NONE DETECTED NONE DETECTED   Cannabinoid 50 Ng, Ur  NONE DETECTED NONE DETECTED   Barbiturates, Ur Screen NONE DETECTED NONE DETECTED   Benzodiazepine, Ur Scrn NONE DETECTED NONE DETECTED   Methadone Scn, Ur NONE DETECTED NONE DETECTED    Comment: (NOTE) 412  Tricyclics, urine               Cutoff 1000 ng/mL 200  Amphetamines, urine             Cutoff 1000 ng/mL 300  MDMA (Ecstasy), urine           Cutoff 500 ng/mL 400  Cocaine Metabolite, urine       Cutoff 300 ng/mL 500  Opiate, urine                   Cutoff 300 ng/mL 600  Phencyclidine (PCP), urine      Cutoff 25 ng/mL 700  Cannabinoid, urine               Cutoff 50 ng/mL 800  Barbiturates, urine             Cutoff 200 ng/mL 900  Benzodiazepine, urine           Cutoff 200 ng/mL 1000 Methadone, urine                Cutoff 300 ng/mL 1100 1200 The urine drug screen provides only a preliminary, unconfirmed 1300 analytical test result and should not be used for non-medical 1400 purposes. Clinical consideration and professional judgment should 1500 be applied to any positive drug screen result due to possible 1600 interfering substances. A more specific alternate chemical method 1700 must be used in order to obtain a confirmed analytical result.  1800 Gas chromato graphy / mass spectrometry (GC/MS) is the preferred 1900 confirmatory method.     Current Facility-Administered Medications  Medication Dose Route Frequency Provider Last Rate Last Dose  . acetaminophen (TYLENOL) tablet 1,000 mg  1,000 mg Oral Q6H PRN Earleen Newport, MD   1,000 mg at 02/20/17 1149  . diphenhydrAMINE (BENADRYL) capsule 25 mg  25 mg Oral Q6H PRN Merlyn Lot, MD   25 mg at 02/19/17 2124  . oxymetazoline (AFRIN) 0.05 % nasal spray 1 spray  1 spray Each Nare BID PRN Lavonia Drafts, MD   1 spray at 02/20/17 0909  . sodium chloride (OCEAN) 0.65 % nasal spray 1 spray  1 spray Each Nare Once Merlyn Lot, MD       Current Outpatient Prescriptions  Medication Sig Dispense Refill  . acetaminophen (TYLENOL) 500 MG tablet Take 500 mg by mouth every 8 (eight) hours as needed.    Marland Kitchen albuterol (PROVENTIL HFA;VENTOLIN HFA) 108 (90 Base) MCG/ACT inhaler Inhale 2 puffs into the lungs every 4 (four) hours as needed for shortness of breath. 1 Inhaler 0  . amLODipine (NORVASC) 5 MG tablet Take 1 tablet (5 mg total) by mouth daily. 30 tablet 0  . aspirin EC 81 MG tablet Take 1 tablet (81 mg total) by mouth daily. 30 tablet 0  . atorvastatin (LIPITOR) 40 MG tablet Take 1 tablet (40 mg total) by mouth daily at 6 PM. 30 tablet 0  . citalopram (CELEXA) 20 MG tablet Take 1 tablet  (20 mg total) by mouth daily. 30 tablet 0  . docusate sodium (COLACE) 100 MG capsule Take 1 capsule (100 mg total) by mouth 2 (two) times daily. 60 capsule 0  . dolutegravir (TIVICAY) 50 MG tablet Take 1 tablet (50 mg total) by mouth daily. 30 tablet 0  . lisinopril (PRINIVIL,ZESTRIL) 20 MG tablet Take 1 tablet (20 mg total) by mouth daily. 30 tablet 0  . meloxicam (MOBIC) 7.5 MG tablet Take 1 tablet (7.5 mg total) by mouth daily. With food 14 tablet 2  . polyethylene glycol (MIRALAX / GLYCOLAX) packet Take 17 g by mouth daily. 14 each 0  . predniSONE (DELTASONE) 10 MG tablet Take 1 tablet (10 mg total) by mouth daily. 6,5,4,3,2,1 six day taper 21 tablet 0  . tamsulosin (FLOMAX) 0.4 MG CAPS capsule Take 1 capsule (0.4 mg total) by mouth daily after breakfast. 30 capsule 0  . emtricitabine-tenofovir AF (DESCOVY) 200-25 MG tablet Take  1 tablet by mouth daily. (Roy [name] not taking: Reported on 02/17/2017) 30 tablet 0  . ergocalciferol (VITAMIN D2) 50000 units capsule Take 50,000 Units by mouth once a week.    . gabapentin (NEURONTIN) 300 MG capsule Take 1 capsule (300 mg total) by mouth 3 (three) times daily. (Roy [name] not taking: Reported on 02/17/2017) 90 capsule 0    Musculoskeletal: Strength & Muscle Tone: within normal limits Gait & Station: normal Roy [name] leans: N/A  Psychiatric Specialty Exam: Physical Exam  Nursing note and vitals reviewed. Constitutional: He appears well-developed and well-nourished.  HENT:  Head: Normocephalic and atraumatic.  Eyes: Conjunctivae are normal. Pupils are equal, round, and reactive to light.  Neck: Normal range of motion.  Cardiovascular: Regular rhythm and normal heart sounds.   Respiratory: Effort normal. No respiratory distress.  GI: Soft.  Musculoskeletal: Normal range of motion.  Neurological: He is alert.  Skin: Skin is warm and dry.  Psychiatric: His speech is delayed. He is slowed. Cognition and memory are normal. He expresses impulsivity. He  exhibits a depressed mood. He expresses suicidal ideation. He expresses no suicidal plans.    Review of Systems  Constitutional: Negative.   HENT: Negative.   Eyes: Negative.   Respiratory: Negative.   Cardiovascular: Negative.   Gastrointestinal: Negative.   Musculoskeletal: Positive for joint pain.  Skin: Negative.   Neurological: Negative.   Psychiatric/Behavioral: Positive for depression, substance abuse and suicidal ideas. Negative for hallucinations and memory loss. The Roy [name] is nervous/anxious and has insomnia.     Blood pressure 127/79, pulse (!) 53, temperature 98.3 F (36.8 C), temperature source Oral, resp. rate 18, height '5\' 9"'$  (1.753 m), weight 96.2 kg (212 lb), SpO2 97 %.Body mass index is 31.31 kg/m.  General Appearance: Casual  Eye Contact:  Fair  Speech:  Normal Rate  Volume:  Normal  Mood:  Depressed  Affect:  Congruent  Thought Process:  Goal Directed  Orientation:  Full (Time, Place, and Person)  Thought Content:  Logical  Suicidal Thoughts:  Yes.  without intent/plan  Homicidal Thoughts:  No  Memory:  Immediate;   Good Recent;   Fair Remote;   Fair  Judgement:  Impaired  Insight:  Shallow  Psychomotor Activity:  Decreased  Concentration:  Concentration: Fair  Recall:  [employer] of Knowledge:  Fair  Language:  Fair  Akathisia:  No  Handed:  Right  AIMS (if indicated):     Assets:  Communication Skills Desire for Improvement  ADL's:  Intact  Cognition:  WNL  Sleep:        Treatment Plan Summary: Daily contact with Roy [name] to assess and evaluate symptoms and progress in treatment and Plan Roy [name] continues to endorse suicidality. Admit Roy [name] to psychiatric unit downstairs. Restart appropriate outpatient medication. Roy [name] is agreeable to the plan. Case reviewed with TTS and emergency room physician.  Disposition: No evidence of imminent risk to self or others at present.   Supportive therapy provided about ongoing stressors.  Alethia Berthold, MD 02/20/2017 3:49 PM

## 2017-02-20 NOTE — ED Notes (Signed)
Report called to [place], RN in BMU.  Pt has signed Voluntary Consent form.  All belongings will be sent with patient.

## 2017-02-20 NOTE — Progress Notes (Signed)
[facility] in scrubs. Pt unsteady when ambulating due to right knee pain/swelling. Pt very calm, cooperative with admission assessment. Appears somewhat anxious, but is bright on interaction, smiling/laughing appropriately. Skin and contraband search performed with Shelia Media, RN present. No contraband found. Scarring to Right knee noted, pt reports surgery in Nov 2017 to Right knee. Describes arthritic pain rated 8/10 to knee. Pt reports recent suicidal ideation, depression, hopelessness due to "using my check to buy cocaine, now I don't have any money or place to go until I'm paid on the first." Pt reports he had not used cocaine "all last month," but when he got paid, he purchased some cocaine and used all his money for the month. Pt reports he is awaiting his mother's home to come available for him to move into due to recent death of his mother, but the house is not ready yet, so he is homeless until then. Pt is requesting Ensure while here. Pt oriented to room/unit/call light, is familiar with unit due to recent admission in November 2017. Educated on fall risk and fall safety. Support and encouragement provided. Food and fluids provided by dietary, pt appears to have good appetite. Safety maintained with every 15 minute checks. Will continue to monitor.

## 2017-02-21 ENCOUNTER — Encounter: Payer: Self-pay | Admitting: Psychiatry

## 2017-02-21 DIAGNOSIS — F332 Major depressive disorder, recurrent severe without psychotic features: Principal | ICD-10-CM

## 2017-02-21 MED ORDER — ENSURE ENLIVE PO LIQD
237.0000 mL | Freq: Three times a day (TID) | ORAL | Status: DC
  Administered 2017-02-21 – 2017-02-27 (×20): 237 mL via ORAL

## 2017-02-21 MED ORDER — OXYMETAZOLINE HCL 0.05 % NA SOLN
1.0000 | Freq: Two times a day (BID) | NASAL | Status: DC
  Administered 2017-02-21 – 2017-02-27 (×13): 1 via NASAL
  Filled 2017-02-21 (×2): qty 15
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
## 2017-02-21 NOTE — Progress Notes (Signed)
D: Pt denies SI/HI/AVH, affect is flat but brightens upon approach. Pt is pleasant and cooperative, thoughts are organized. Pt appears less anxious and he is interacting with peers and staff appropriately. Patient c/o nasal congestion because he has polyps he stated " I only used afrin, l have been on it for years"  Writer noted that patient's afrin has been recently discontinued.   A: Pt was offered support and encouragement. Pt was given scheduled medications. MD on call paged awiating response. Pt was encouraged to attend groups. Q 15 minute checks were done for safety.  R:Pt attends groups and interacts well with peers and staff. Pt is taking medication. Patient receptive to treatment and safety maintained on unit.

## 2017-02-21 NOTE — [facility] Group Notes (Signed)
[facility] Group Notes:  (Nursing/MHT/Case Management/Adjunct)  Date:  02/21/2017  Time:  4:59 PM  Type of Therapy:  Psychoeducational Skills  Participation Level:  Did Not Attend  Swan [place] De'Chelle Marty Uy 02/21/2017, 4:59 PM

## 2017-02-21 NOTE — Progress Notes (Signed)
Pt on the phone, from the phone, pt speaks to nurse reporting he felt dizzy. When pt hung up the phone, VS checked, stable as noted. Fluids encouraged. Pt re-educated on fall safety, to use call light, no slip socks, use side rails on walls of hallway, rise slowly from lying to sitting to standing. Pt verbalized understanding, went to lie down in his bed. Support and encouragement provided. Safety maintained. Will continue to monitor.

## 2017-02-21 NOTE — [facility] Group Notes (Signed)
[facility] Group Notes:  (Nursing/MHT/Case Management/Adjunct)  Date:  02/21/2017  Time:  1:50 AM  Type of Therapy:  Group Therapy  Participation Level:  Active  Participation Quality:  Appropriate  Affect:  Appropriate  Cognitive:  Appropriate  Insight:  Appropriate  Engagement in Group:  Engaged  Modes of Intervention:  n/a  Summary of Progress/Problems:  Roy [name] Roy [name] 02/21/2017, 1:50 AM 

## 2017-02-21 NOTE — Progress Notes (Signed)
Pt awake, alert, oriented and up on unit today. Pleasant and cooperative, bright on approach. Requests made for nose spray "because I can't breathe without it" and ensure to drink. Orders placed by MD and dietician. Continues to endorse suicidal ideation without a specific plan, verbally contracts for safety here at the hospital. Pt appropriately interacts with staff/peers, but is noted to minimally interact with peers. Did not attend group today. Pt is medication complaint.   Support and encouragement provided with use of therapeutic communication. Medications administered as ordered with education. Safety maintained with every 15 minute checks. Will continue to monitor.

## 2017-02-21 NOTE — Plan of Care (Signed)
Problem: Medication: Goal: Compliance with prescribed medication regimen will improve Outcome: Progressing Compliant with medications as ordered.

## 2017-02-21 NOTE — Progress Notes (Signed)
Recreation Therapy Notes  INPATIENT RECREATION THERAPY ASSESSMENT  Patient Details Name: Roy Sabetta [facility] Sr. MRN: [id number] DOB: 1966/11/22 Today's Date: 02/21/2017  Patient Stressors: Family, Relationship, Death, Other (Comment) (Doesn't talk to family; dating but "she's a mess"; mom died in 2016/11/28; can't sleep; about to be locked up for a year; arthritis is hurting him)  Coping Skills:   Isolate, Substance Abuse, Avoidance, Art/Dance, Music, Sports  Personal Challenges: Anger, Communication, Concentration, Decision-Making, Expressing Yourself, Relationships, Self-Esteem/Confidence, Problem-Solving, Social Interaction, Stress Management, Substance Abuse, Time Management, Trusting Others  Leisure Interests (2+):  [employer], Individual - Other (Comment) (Going out to eat)  Awareness of Community Resources:  Yes  Community Resources:  Gym, Other (Comment) ([place])  Current Use: No  If no, Barriers?: Transportation  Patient Strengths:  "Not right now"  Patient Identified Areas of Improvement:  Everything  Current Recreation Participation:  Doing dope  Patient Goal for Hospitalization:  Patient does not know yet  [place] of Residence:  [place] of Residence:  [place]   Current SI (including self-harm):  Yes  Current HI:  No  Consent to Intern Participation: N/A   Jacquelynn Cree, LRT/CTRS 02/21/2017, 4:06 PM

## 2017-02-21 NOTE — [facility] Group Notes (Signed)
[facility] LCSW Group Therapy Note  Date/Time: 02/21/17, 0930  Type of Therapy/Topic:  Group Therapy:  Balance in Life  Participation Level:  Pt did not attend group.  Description of Group:    This group will address the concept of balance and how it feels and looks when one is unbalanced. Patients will be encouraged to process areas in their lives that are out of balance, and identify reasons for remaining unbalanced. Facilitators will guide patients utilizing problem- solving interventions to address and correct the stressor making their life unbalanced. Understanding and applying boundaries will be explored and addressed for obtaining  and maintaining a balanced life. Patients will be encouraged to explore ways to assertively make their unbalanced needs known to significant others in their lives, using other group members and facilitator for support and feedback.  Therapeutic Goals: 1. Patient will identify two or more emotions or situations they have that consume much of in their lives. 2. Patient will identify signs/triggers that life has become out of balance:  3. Patient will identify two ways to set boundaries in order to achieve balance in their lives:  4. Patient will demonstrate ability to communicate their needs through discussion and/or role plays  Summary of Patient Progress:          Therapeutic Modalities:   Cognitive Behavioral Therapy Solution-Focused Therapy Assertiveness Training  D[employer], LCSW

## 2017-02-21 NOTE — [facility] Suicide Risk Assessment (Signed)
[facility] INPATIENT:  Family/Significant Other Suicide Prevention Education  Suicide Prevention Education:  Patient Refusal for Family/Significant Other Suicide Prevention Education: The patient Roy Travassos Sr. has refused to provide written consent for family/significant other to be provided Family/Significant Other Suicide Prevention Education during admission and/or prior to discharge.  Physician notified.  Lynden Oxford, MSW, LCSW-A 02/21/2017, 9:49 AM

## 2017-02-21 NOTE — [facility] Suicide Risk Assessment (Signed)
[facility] Admission Suicide Risk Assessment   Nursing information obtained from:  Patient Demographic factors:  Male, Divorced or widowed, Low socioeconomic status, Unemployed Current Mental Status:  Suicidal ideation indicated by patient Loss Factors:  Decline in physical health, Legal issues, Financial problems / change in socioeconomic status Historical Factors:  Prior suicide attempts, Family history of mental illness or substance abuse Risk Reduction Factors:  Sense of responsibility to family  Total Time spent with patient: 1 hour Principal Problem: Severe recurrent major depression without psychotic features (HCC) Diagnosis:   Patient Active Problem List   Diagnosis Date Noted  . Cocaine use disorder, moderate, dependence (HCC) [F14.20] 02/19/2017  . Substance induced mood disorder (HCC) [F19.94] 02/19/2017  . Tobacco use disorder [F17.200] 10/26/2016  . HIV disease (HCC) [B20] 10/26/2016  . HTN (hypertension) [I10] 10/26/2016  . Dyslipidemia [E78.5] 10/26/2016  . BPH (benign prostatic hyperplasia) [N40.0] 10/26/2016  . Constipation [K59.00] 10/26/2016  . Severe recurrent major depression without psychotic features (HCC) [F33.2] 10/25/2016   Subjective Data: suicidal ideation.  Continued Clinical Symptoms:  Alcohol Use Disorder Identification Test Final Score (AUDIT): 0 The "Alcohol Use Disorders Identification Test", Guidelines for Use in Primary Care, Second Edition.  World Science writer [facility]). Score between 0-7:  no or low risk or alcohol related problems. Score between 8-15:  moderate risk of alcohol related problems. Score between 16-19:  high risk of alcohol related problems. Score 20 or above:  warrants further diagnostic evaluation for alcohol dependence and treatment.   CLINICAL FACTORS:   Depression:   Comorbid alcohol abuse/dependence Impulsivity Alcohol/Substance Abuse/Dependencies   Musculoskeletal: Strength & Muscle Tone: within normal limits Gait &  Station: normal Patient leans: N/A  Psychiatric Specialty Exam: Physical Exam  Nursing note and vitals reviewed. Psychiatric: His speech is normal. He is withdrawn. Cognition and memory are normal. He expresses impulsivity. He exhibits a depressed mood. He expresses suicidal ideation. He expresses suicidal plans.    Review of Systems  Psychiatric/Behavioral: Positive for depression, substance abuse and suicidal ideas.  All other systems reviewed and are negative.   Blood pressure (!) 144/92, pulse (!) 47, temperature 98.6 F (37 C), resp. rate 18, height 5\' 8"  (1.727 m), weight 93.4 kg (206 lb), SpO2 100 %.Body mass index is 31.32 kg/m.  General Appearance: Casual  Eye Contact:  Good  Speech:  Clear and Coherent  Volume:  Normal  Mood:  Angry  Affect:  Congruent  Thought Process:  Goal Directed and Descriptions of Associations: Intact  Orientation:  Full (Time, Place, and Person)  Thought Content:  WDL  Suicidal Thoughts:  Yes.  with intent/plan  Homicidal Thoughts:  No  Memory:  Immediate;   Fair Recent;   Fair Remote;   Fair  Judgement:  Poor  Insight:  Lacking  Psychomotor Activity:  Decreased  Concentration:  Concentration: Fair and Attention Span: Fair  Recall:  [employer] of Knowledge:  Fair  Language:  Fair  Akathisia:  No  Handed:  Right  AIMS (if indicated):     Assets:  Communication Skills Desire for Improvement Financial Resources/Insurance Resilience  ADL's:  Intact  Cognition:  WNL  Sleep:  Number of Hours: 5.5      COGNITIVE FEATURES THAT CONTRIBUTE TO RISK:  None    SUICIDE RISK:   Moderate:  Frequent suicidal ideation with limited intensity, and duration, some specificity in terms of plans, no associated intent, good self-control, limited dysphoria/symptomatology, some risk factors present, and identifiable protective factors, including available and accessible  social support.  PLAN OF CARE: Hospital admission, medication management, and  substance abuse counseling, discharge planning.  Mr. Bruney is a 51 year old male with history of depression and substance admitted for suicidal ideation in the context of relapse on cocaine and homelessness.  1. Suicidal ideation. The patient is able to contract for safety in the hospital.  2. Mood. We'll continue Celexa 20 mg daily.  3. HIV. We continue antiretrovirals. The patient follows up with [facility] ID clinic.  4. Hypertension. He is on ASA, lisinopril and Norvasc.  5. COPD. He is on albuterol.  6. Arthritis. He is on Mobic and Neurontin.  7. BPH. He is on Flomax.  8. Constipation. He is on Colace.  9. Weight loss. We offered an sure.  10. Allergies. He is on Afrin.  11. Cocaine abuse. The patient has a long history of cocaine use. He minimizes his problems and puts forward depression. He is not interested in substance abuse treatment.  12. Social. He spent all his money this month, he is homeless, he goes to jail on April 7 for a year.  13. Disposition. To be established. He follows up with [facility] infectious disease clinic at [facility] in [place].    I certify that inpatient services furnished can reasonably be expected to improve the patient's condition.   Kristine Linea, MD 02/21/2017, 1:18 PM

## 2017-02-21 NOTE — [facility] Group Notes (Signed)
[facility] Group Notes:  (Nursing/MHT/Case Management/Adjunct)  Date:  02/21/2017  Time:  9:55 PM  Type of Therapy:  Evening Wrap-up Group  Participation Level:  Minimal  Participation Quality:  Appropriate and Attentive  Affect:  Appropriate  Cognitive:  Appropriate  Insight:  Appropriate  Engagement in Group:  Improving  Modes of Intervention:  Discussion  Summary of Progress/Problems:  Tomasita Morrow 02/21/2017, 9:55 PM

## 2017-02-21 NOTE — Progress Notes (Signed)
NUTRITION ASSESSMENT  Pt identified as at risk on the Malnutrition Screen Tool  INTERVENTION: 1. Educated patient on the importance of nutrition and encouraged intake of food and beverages. 2. Discussed weight goals. 3. Supplements: Ensure Enlive po TID per patient request, each supplement provides 350 kcal and 20 grams of protein  NUTRITION DIAGNOSIS: Unintentional weight loss related to sub-optimal intake as evidenced by pt report.   Goal: Pt to meet >/= 90% of their estimated nutrition needs.  Monitor:  PO intake  Assessment:  51 y.o. male with PMHx of HTN, HIVAIDS, Hepatitis C, Bipolar disorder, GERD.  Spoke with patient in room. He reports his appetite has been poor for a while now. He may eat 1.5 meals per day (usually french fries or cheese burgers). Patient reports he has difficulty chewing because he does not have any teeth so he has to eat soft foods. Patient also endorses abdominal pain from gastritis and acid reflux, so he reports avoiding greasy or fried foods. Patient is requesting Ensure TID. Also discussed with patient soft foods on the menu he can choose and also soft snacks he can have on the unit between meals.   Patient reports he was 212 lbs one week ago and he has lost 6 lbs (2.8% body weight) over 1 week, which would be significant for time frame. Unsure of accuracy of weight recorded in chart last week as previously patient was 185-195 lbs.   Meal Completion: 100% of Regular diet per chart  Height: Ht Readings from Last 1 Encounters:  02/20/17 5\' 8"  (1.727 m)    Weight: Wt Readings from Last 1 Encounters:  02/20/17 206 lb (93.4 kg)    Weight Hx: Wt Readings from Last 10 Encounters:  02/20/17 206 lb (93.4 kg)  02/19/17 212 lb (96.2 kg)  02/17/17 212 lb (96.2 kg)  02/17/17 212 lb (96.2 kg)  12/19/16 185 lb (83.9 kg)  12/17/16 195 lb (88.5 kg)  10/25/16 194 lb (88 kg)  10/24/16 185 lb (83.9 kg)  12/04/15 196 lb (88.9 kg)  11/08/15 185 lb (83.9 kg)     BMI:  Body mass index is 31.32 kg/m. Pt meets criteria for obesity class I based on current BMI.  Estimated Nutritional Needs: Kcal: 25-30 kcal/kg Protein: > 1 gram protein/kg Fluid: 1 ml/kcal  Diet Order: Diet regular Room service appropriate? Yes; Fluid consistency: Thin Pt is also offered choice of unit snacks mid-morning and mid-afternoon.  Pt is eating as desired.   Lab results and medications reviewed.   Helane Rima, MS, RD, LDN Pager: [phone] After Hours Pager: [phone]

## 2017-02-21 NOTE — H&P (Signed)
Psychiatric Admission Assessment Adult  Patient Identification: Roy Sundt Wahlert Sr. MRN:  [id number] Date of Evaluation:  02/21/2017 Chief Complaint:  Depression Principal Diagnosis: Severe recurrent major depression without psychotic features [facility]) Diagnosis:   Patient Active Problem List   Diagnosis Date Noted  . Cocaine use disorder, moderate, dependence (HCC) [F14.20] 02/19/2017  . Substance induced mood disorder (HCC) [F19.94] 02/19/2017  . Tobacco use disorder [F17.200] 10/26/2016  . HIV disease (HCC) [B20] 10/26/2016  . HTN (hypertension) [I10] 10/26/2016  . Dyslipidemia [E78.5] 10/26/2016  . BPH (benign prostatic hyperplasia) [N40.0] 10/26/2016  . Constipation [K59.00] 10/26/2016  . Severe recurrent major depression without psychotic features (HCC) [F33.2] 2016/11/18   History of Present Illness:   Identifying data. Roy [name] is a 51 year old male with history of depression, mood instability and substance abuse.   Chief complaint. "I lost my mother."  History of present illness. Information was obtained from the patient and the chart.The patient came to the emergency room. Days ago complaining of chest pain. He was ruled out for cardiac event and sent home after a few days. He returned to the hospital that same day this time complaining of suicidal ideation with a plan to overdose on medications. He reports that he is under considerable stress since his mother passed away in Nov 18, 2016. He promised his dying mother that he would do better but has not been able to keep his promises. He reports good compliance with medications but he has been getting increasingly depressed with poor sleep, decreased appetite, anhedonia, feeling of guilt and hopelessness worthlessness, poor energy and concentration social isolation crying spells that culminated in suicidal thinking. It is quite possible that he is malingering as he is now also homeless and without any money that he spent  cocaine. He denies psychotic symptoms or symptoms suggestive of bipolar mania. He is not excessively anxious. He reports that he has been free of substances for the past month living with his brother. He recently relapsed on cocaine and may no longer stay with his brother. He denies alcohol use.    Past psychiatric history. The patient reports prior hospitalization for "crisis". He he was admitted to [facility] several times. His last hospitalization was at [facility] in January in a very similar scenario. He is usually maintained on Celexa. He believes that Abilify gave him EPS. He believes that he has a diagnosis of bipolar illness. He reports suicidal attempts in the past.  Family psychiatric history. His son has substance abuse problems but is doing better. He just graduated from the [employer].  Social history. He is disabled from HIV . He has health insurance. Since his mother passed awa,y he does not have much support and not a place to stay. He has family in the area but they have not been supportive. Of note, the patient doesn't read or write. The patient has extensive legal history but has not been arrested in the past 8 years. He will go to jail for 1 year on March 23, 2017.  Total Time spent with patient: 1 hour  Is the patient at risk to self? Yes.    Has the patient been a risk to self in the past 6 months? Yes.    Has the patient been a risk to self within the distant past? Yes.    Is the patient a risk to others? No.  Has the patient been a risk to others in the past 6 months? No.  Has the patient been  a risk to others within the distant past? No.   Prior Inpatient Therapy:   Prior Outpatient Therapy:    Alcohol Screening: 1. How often do you have a drink containing alcohol?: Never 2. How many drinks containing alcohol do you have on a typical day when you are drinking?: 1 or 2 3. How often do you have six or more drinks on one occasion?: Never Preliminary  Score: 0 4. How often during the last year have you found that you were not able to stop drinking once you had started?: Never 5. How often during the last year have you failed to do what was normally expected from you becasue of drinking?: Never 6. How often during the last year have you needed a first drink in the morning to get yourself going after a heavy drinking session?: Never 7. How often during the last year have you had a feeling of guilt of remorse after drinking?: Never 8. How often during the last year have you been unable to remember what happened the night before because you had been drinking?: Never 9. Have you or someone else been injured as a result of your drinking?: No 10. Has a relative or friend or a doctor or another health worker been concerned about your drinking or suggested you cut down?: No Alcohol Use Disorder Identification Test Final Score (AUDIT): 0 Brief Intervention: AUDIT score less than 7 or less-screening does not suggest unhealthy drinking-brief intervention not indicated Substance Abuse History in the last 12 months:  Yes.   Consequences of Substance Abuse: Negative Previous Psychotropic Medications: Yes  Psychological Evaluations: No  Past Medical History:  Past Medical History:  Diagnosis Date  . AIDS (acquired immune deficiency syndrome) (HCC)   . Arthritis   . Asthma   . Bipolar disorder (HCC)   . Bronchitis   . Depression   . GERD (gastroesophageal reflux disease)   . Hepatitis C   . HIV (human immunodeficiency virus infection) (HCC)   . HTN (hypertension)     Past Surgical History:  Procedure Laterality Date  . HERNIA REPAIR    . TOE SURGERY     Family History: History reviewed. No pertinent family history.  Tobacco Screening: Have you used any form of tobacco in the last 30 days? (Cigarettes, Smokeless Tobacco, Cigars, and/or Pipes): Yes Tobacco use, Select all that apply: 4 or less cigarettes per day Are you interested in Tobacco  Cessation Medications?: No, patient refused Counseled patient on smoking cessation including recognizing danger situations, developing coping skills and basic information about quitting provided: Yes Social History:  History  Alcohol Use No     History  Drug Use  . Types: Cocaine    Additional Social History:      History of alcohol / drug use?: Yes Negative Consequences of Use: Financial, Legal, Personal relationships                    Allergies:   Allergies  Allergen Reactions  . Lactose Other (See Comments)    GI distress  . Pollen Extract Itching   Lab Results: No results found for this or any previous visit (from the past 48 hour(s)).  Blood Alcohol level:  Lab Results  Component Value Date   [facility] <5 02/19/2017   ETH <5 12/19/2016    Metabolic Disorder Labs:  Lab Results  Component Value Date   HGBA1C 5.6 10/26/2016   MPG 114 10/26/2016   No results found for: PROLACTIN Lab Results  Component Value Date   CHOL 138 10/26/2016   TRIG 62 10/26/2016   HDL 57 10/26/2016   CHOLHDL 2.4 10/26/2016   VLDL 12 10/26/2016   LDLCALC 69 10/26/2016    Current Medications: Current Facility-Administered Medications  Medication Dose Route Frequency Provider Last Rate Last Dose  . acetaminophen (TYLENOL) tablet 650 mg  650 mg Oral Q6H PRN Audery Amel, MD   650 mg at 02/21/17 0801  . albuterol (PROVENTIL HFA;VENTOLIN HFA) 108 (90 Base) MCG/ACT inhaler 2 puff  2 puff Inhalation Q4H PRN Audery Amel, MD      . alum & mag hydroxide-simeth (MAALOX/MYLANTA) 200-200-20 MG/5ML suspension 30 mL  30 mL Oral Q4H PRN Audery Amel, MD      . amLODipine (NORVASC) tablet 5 mg  5 mg Oral Daily Audery Amel, MD   5 mg at 02/21/17 0800  . aspirin EC tablet 81 mg  81 mg Oral Daily Audery Amel, MD   81 mg at 02/21/17 0800  . citalopram (CELEXA) tablet 20 mg  20 mg Oral Daily Audery Amel, MD   20 mg at 02/21/17 0800  . docusate sodium (COLACE) capsule 100 mg  100 mg  Oral BID Audery Amel, MD   100 mg at 02/21/17 0800  . dolutegravir (TIVICAY) tablet 50 mg  50 mg Oral Daily Audery Amel, MD   50 mg at 02/21/17 0800  . emtricitabine-tenofovir AF (DESCOVY) 200-25 MG per tablet 1 tablet  1 tablet Oral Daily Audery Amel, MD   1 tablet at 02/21/17 0800  . feeding supplement (ENSURE ENLIVE) (ENSURE ENLIVE) liquid 237 mL  237 mL Oral TID BM Kelli Robeck B Jadon Ressler, MD   237 mL at 02/21/17 1030  . gabapentin (NEURONTIN) capsule 300 mg  300 mg Oral TID Audery Amel, MD   300 mg at 02/21/17 1112  . lisinopril (PRINIVIL,ZESTRIL) tablet 20 mg  20 mg Oral Daily Audery Amel, MD   20 mg at 02/21/17 0800  . magnesium hydroxide (MILK OF MAGNESIA) suspension 30 mL  30 mL Oral Daily PRN Audery Amel, MD      . meloxicam (MOBIC) tablet 7.5 mg  7.5 mg Oral Daily Audery Amel, MD   7.5 mg at 02/21/17 0800  . oxymetazoline (AFRIN) 0.05 % nasal spray 1 spray  1 spray Each Nare BID Shari Prows, MD   1 spray at 02/21/17 1111  . tamsulosin (FLOMAX) capsule 0.4 mg  0.4 mg Oral QPC supper Audery Amel, MD   0.4 mg at 02/20/17 2100   PTA Medications: Prescriptions Prior to Admission  Medication Sig Dispense Refill Last Dose  . acetaminophen (TYLENOL) 500 MG tablet Take 500 mg by mouth every 8 (eight) hours as needed.   02/16/2017 at Unknown time  . albuterol (PROVENTIL HFA;VENTOLIN HFA) 108 (90 Base) MCG/ACT inhaler Inhale 2 puffs into the lungs every 4 (four) hours as needed for shortness of breath. 1 Inhaler 0 02/16/2017 at Unknown time  . amLODipine (NORVASC) 5 MG tablet Take 1 tablet (5 mg total) by mouth daily. 30 tablet 0 02/16/2017 at Unknown time  . aspirin EC 81 MG tablet Take 1 tablet (81 mg total) by mouth daily. 30 tablet 0 02/16/2017 at Unknown time  . atorvastatin (LIPITOR) 40 MG tablet Take 1 tablet (40 mg total) by mouth daily at 6 PM. 30 tablet 0 02/16/2017 at Unknown time  . citalopram (CELEXA) 20 MG tablet Take 1 tablet (20  mg total) by mouth daily. 30  tablet 0 02/16/2017 at Unknown time  . docusate sodium (COLACE) 100 MG capsule Take 1 capsule (100 mg total) by mouth 2 (two) times daily. 60 capsule 0 02/16/2017 at Unknown time  . dolutegravir (TIVICAY) 50 MG tablet Take 1 tablet (50 mg total) by mouth daily. 30 tablet 0 02/16/2017 at Unknown time  . emtricitabine-tenofovir AF (DESCOVY) 200-25 MG tablet Take 1 tablet by mouth daily. (Patient not taking: Reported on 02/17/2017) 30 tablet 0 Not Taking at Unknown time  . ergocalciferol (VITAMIN D2) 50000 units capsule Take 50,000 Units by mouth once a week.   Not Taking at Unknown time  . gabapentin (NEURONTIN) 300 MG capsule Take 1 capsule (300 mg total) by mouth 3 (three) times daily. (Patient not taking: Reported on 02/17/2017) 90 capsule 0 Not Taking at Unknown time  . lisinopril (PRINIVIL,ZESTRIL) 20 MG tablet Take 1 tablet (20 mg total) by mouth daily. 30 tablet 0 02/16/2017 at Unknown time  . meloxicam (MOBIC) 7.5 MG tablet Take 1 tablet (7.5 mg total) by mouth daily. With food 14 tablet 2 02/16/2017 at Unknown time  . polyethylene glycol (MIRALAX / GLYCOLAX) packet Take 17 g by mouth daily. 14 each 0 02/16/2017 at Unknown time  . predniSONE (DELTASONE) 10 MG tablet Take 1 tablet (10 mg total) by mouth daily. 6,5,4,3,2,1 six day taper 21 tablet 0   . tamsulosin (FLOMAX) 0.4 MG CAPS capsule Take 1 capsule (0.4 mg total) by mouth daily after breakfast. 30 capsule 0 02/16/2017 at Unknown time    Musculoskeletal: Strength & Muscle Tone: within normal limits Gait & Station: normal Patient leans: N/A  Psychiatric Specialty Exam: I reviewed physical exam performed in the emergency room and agree with the findings. Physical Exam  Nursing note and vitals reviewed. Psychiatric: His speech is normal. His affect is angry. He is withdrawn. Cognition and memory are normal. He expresses impulsivity. He expresses suicidal ideation. He expresses suicidal plans.    Review of Systems  Musculoskeletal: Positive for joint  pain.  Psychiatric/Behavioral: Positive for substance abuse.  All other systems reviewed and are negative.   Blood pressure (!) 144/92, pulse (!) 47, temperature 98.6 F (37 C), resp. rate 18, height 5\' 8"  (1.727 m), weight 93.4 kg (206 lb), SpO2 100 %.Body mass index is 31.32 kg/m.  See SRA.                                                  Sleep:  Number of Hours: 5.5    Treatment Plan Summary: Daily contact with patient to assess and evaluate symptoms and progress in treatment and Medication management   Roy [name] is a 51 year old male with history of depression and substance admitted for suicidal ideation in the context of relapse on cocaine and homelessness.  1. Suicidal ideation. The patient is able to contract for safety in the hospital.  2. Mood. We'll continue Celexa 20 mg daily.  3. HIV. We continue antiretrovirals. The patient follows up with [facility] ID clinic.  4. Hypertension. He is on ASA, lisinopril and Norvasc.  5. COPD. He is on albuterol.  6. Arthritis. He is on Mobic and Neurontin.  7. BPH. He is on Flomax.  8. Constipation. He is on Colace.  9. Weight loss. We offered an sure.  10. Allergies. He is on Afrin.  11. Cocaine abuse. The patient has a long history of cocaine use. He minimizes his problems and puts forward depression. He is not interested in substance abuse treatment.  12. Social. He spent all his money this month, he is homeless, he goes to jail on April 7 for a year.  13. Disposition. To be established. He follows up with [facility] infectious disease clinic [facility] in [place].   Observation Level/Precautions:  15 minute checks  Laboratory:  CBC Chemistry Profile UDS UA  Psychotherapy:    Medications:    Consultations:    Discharge Concerns:    Estimated LOS:  Other:     Physician Treatment Plan for Primary Diagnosis: Severe recurrent major depression without psychotic features (HCC) Long Term Goal(s):  Improvement in symptoms so as ready for discharge  Short Term Goals: Ability to identify changes in lifestyle to reduce recurrence of condition will improve, Ability to verbalize feelings will improve, Ability to disclose and discuss suicidal ideas, Ability to demonstrate self-control will improve, Ability to identify and develop effective coping behaviors will improve, Ability to maintain clinical measurements within normal limits will improve, Compliance with prescribed medications will improve and Ability to identify triggers associated with substance abuse/mental health issues will improve  Physician Treatment Plan for Secondary Diagnosis: Principal Problem:   Severe recurrent major depression without psychotic features (HCC) Active Problems:   Tobacco use disorder   HIV disease (HCC)   HTN (hypertension)   BPH (benign prostatic hyperplasia)   Constipation   Cocaine use disorder, moderate, dependence (HCC)   Substance induced mood disorder (HCC)  Long Term Goal(s): Improvement in symptoms so as ready for discharge  Short Term Goals: Ability to identify changes in lifestyle to reduce recurrence of condition will improve, Ability to demonstrate self-control will improve and Ability to identify triggers associated with substance abuse/mental health issues will improve  I certify that inpatient services furnished can reasonably be expected to improve the patient's condition.    Kristine Linea, MD 3/8/20181:18 PM

## 2017-02-22 MED ORDER — TRAZODONE HCL 100 MG PO TABS
100.0000 mg | ORAL_TABLET | Freq: Every day | ORAL | Status: DC
  Administered 2017-02-22: 100 mg via ORAL
  Filled 2017-02-22: qty 1

## 2017-02-22 NOTE — Progress Notes (Signed)
[facility] MD Progress Note  02/22/2017 12:11 PM Roy Zigmund Daniel Mallicoat Sr.  MRN:  [id number]  Subjective:   [name] has a long history of depression, mood instability, and substance. He relapsed on cocaine recently and came to the hospital suicidal. He has not been compliant with his medications including HIV treatment. He was restarted on all his medicines. He is able to return to his brother's house. He will go to prison for a year on April 9 which is his major stressor now. He also spent his money on cocaine.  02/22/2017. [name] met with treatment team today. He feels slightly better as he realizes that he can return to his brother's place. He seems to tolerate medications well. Sleep and appetite are good. He receives additional and as he claims to lose some weight. He complains of arthritis pain in his right leg that he now believes spread to his hip. This is an old problem. He is very worried about his HIV status as he was noncompliant and feels energetic. He is asking to get CD4 count. I would do it but the patient is going to [facility] ID clinic where he should follow up before his jail time.  Per nursing: D: Pt denies SI/HI/AVH. Pt is pleasant and cooperative. Pt appears less anxious and he is interacting with peers and staff appropriately. Patient thought are organized. Patient stated he wanted his CD4 checked because he was feeling funny.  A: Pt was offered support and encouragement. Pt was given scheduled medications. Pt was encouraged to attend groups. Q 15 minute checks were done for safety.  R:Pt attends groups and interacts well with peers and staff. Pt is taking medication. Pt has no complaints.Pt receptive to treatment and safety maintained on unit.  Principal Problem: Severe recurrent major depression without psychotic features ([place]) Diagnosis:   Patient Active Problem List   Diagnosis Date Noted  . Cocaine use disorder, moderate, dependence ([place]) [F14.20] 02/19/2017  . Substance induced  mood disorder ([place]) [F19.94] 02/19/2017  . Tobacco use disorder [F17.200] 10/26/2016  . HIV disease ([place]) [B20] 10/26/2016  . HTN (hypertension) [I10] 10/26/2016  . Dyslipidemia [E78.5] 10/26/2016  . BPH (benign prostatic hyperplasia) [N40.0] 10/26/2016  . Constipation [K59.00] 10/26/2016  . Severe recurrent major depression without psychotic features ([place]) [F33.2] 10/25/2016   Total Time spent with patient: 20 minutes  Past Psychiatric History: substance abuse.  Past Medical History:  Past Medical History:  Diagnosis Date  . AIDS (acquired immune deficiency syndrome) ([place])   . Arthritis   . Asthma   . Bipolar disorder ([place])   . Bronchitis   . Depression   . GERD (gastroesophageal reflux disease)   . Hepatitis C   . HIV (human immunodeficiency virus infection) ([place])   . HTN (hypertension)     Past Surgical History:  Procedure Laterality Date  . HERNIA REPAIR    . TOE SURGERY     Family History: History reviewed. No pertinent family history. Family Psychiatric  History: see H&P. Social History:  History  Alcohol Use No     History  Drug Use  . Types: Cocaine    Social History   Social History  . Marital status: Divorced    Spouse name: N/A  . Number of children: N/A  . Years of education: N/A   Social History Main Topics  . Smoking status: Current Every Day Smoker    Types: Cigarettes  . Smokeless tobacco: Never Used  . Alcohol use No  . Drug  use: Yes    Types: Cocaine  . Sexual activity: Yes   Other Topics Concern  . None   Social History Narrative  . None   Additional Social History:    History of alcohol / drug use?: Yes Negative Consequences of Use: Financial, Legal, Personal relationships                    Sleep: Fair  Appetite:  Fair  Current Medications: Current Facility-Administered Medications  Medication Dose Route Frequency Provider Last Rate Last Dose  . acetaminophen (TYLENOL) tablet 650 mg  650 mg Oral Q6H PRN Gonzella Lex, MD   650 mg at 02/21/17 0801  . albuterol (PROVENTIL HFA;VENTOLIN HFA) 108 (90 Base) MCG/ACT inhaler 2 puff  2 puff Inhalation Q4H PRN Gonzella Lex, MD      . alum & mag hydroxide-simeth (MAALOX/MYLANTA) 200-200-20 MG/5ML suspension 30 mL  30 mL Oral Q4H PRN Gonzella Lex, MD      . amLODipine (NORVASC) tablet 5 mg  5 mg Oral Daily Gonzella Lex, MD   5 mg at 02/22/17 0845  . aspirin EC tablet 81 mg  81 mg Oral Daily Gonzella Lex, MD   81 mg at 02/22/17 0845  . citalopram (CELEXA) tablet 20 mg  20 mg Oral Daily Gonzella Lex, MD   20 mg at 02/22/17 0845  . docusate sodium (COLACE) capsule 100 mg  100 mg Oral BID Gonzella Lex, MD   100 mg at 02/22/17 0845  . dolutegravir (TIVICAY) tablet 50 mg  50 mg Oral Daily Gonzella Lex, MD   50 mg at 02/22/17 0846  . emtricitabine-tenofovir AF (DESCOVY) 200-25 MG per tablet 1 tablet  1 tablet Oral Daily Gonzella Lex, MD   1 tablet at 02/22/17 0845  . feeding supplement (ENSURE ENLIVE) (ENSURE ENLIVE) liquid 237 mL  237 mL Oral TID BM Caswell Alvillar B Danford Tat, MD   237 mL at 02/22/17 1000  . gabapentin (NEURONTIN) capsule 300 mg  300 mg Oral TID Gonzella Lex, MD   300 mg at 02/22/17 0845  . lisinopril (PRINIVIL,ZESTRIL) tablet 20 mg  20 mg Oral Daily Gonzella Lex, MD   20 mg at 02/22/17 0844  . magnesium hydroxide (MILK OF MAGNESIA) suspension 30 mL  30 mL Oral Daily PRN Gonzella Lex, MD      . meloxicam (MOBIC) tablet 7.5 mg  7.5 mg Oral Daily Gonzella Lex, MD   7.5 mg at 02/22/17 0845  . oxymetazoline (AFRIN) 0.05 % nasal spray 1 spray  1 spray Each Nare BID Clovis Fredrickson, MD   1 spray at 02/22/17 0846  . tamsulosin (FLOMAX) capsule 0.4 mg  0.4 mg Oral QPC supper Gonzella Lex, MD   0.4 mg at 02/21/17 1647    Lab Results: No results found for this or any previous visit (from the past 48 hour(s)).  Blood Alcohol level:  Lab Results  Component Value Date   [facility] Psychiatric <5 02/19/2017   ETH <5 13/24/4010    Metabolic Disorder  Labs: Lab Results  Component Value Date   HGBA1C 5.6 10/26/2016   MPG 114 10/26/2016   No results found for: PROLACTIN Lab Results  Component Value Date   CHOL 138 10/26/2016   TRIG 62 10/26/2016   HDL 57 10/26/2016   CHOLHDL 2.4 10/26/2016   VLDL 12 10/26/2016   LDLCALC 69 10/26/2016    Physical Findings: AIMS: Facial and Oral  Movements Muscles of Facial Expression: None, normal Lips and Perioral Area: None, normal Jaw: None, normal Tongue: None, normal,Extremity Movements Upper (arms, wrists, hands, fingers): None, normal Lower (legs, knees, ankles, toes): None, normal, Trunk Movements Neck, shoulders, hips: None, normal, Overall Severity Severity of abnormal movements (highest score from questions above): None, normal Incapacitation due to abnormal movements: None, normal [name] awareness of abnormal movements (rate only patient's report): No Awareness, Dental Status Current problems with teeth and/or dentures?: Yes Does patient usually wear dentures?: No  CIWA:    COWS:     Musculoskeletal: Strength & Muscle Tone: within normal limits Gait & Station: normal Patient leans: N/A  Psychiatric Specialty Exam: Physical Exam  Nursing note and vitals reviewed. Psychiatric: His speech is normal. He is withdrawn. Cognition and memory are normal. He expresses impulsivity. He exhibits a depressed mood. He expresses suicidal ideation. He expresses suicidal plans.    Review of Systems  Psychiatric/Behavioral: Positive for depression, substance abuse and suicidal ideas.  All other systems reviewed and are negative.   Blood pressure (!) 151/96, pulse (!) 52, temperature 98.7 F (37.1 C), temperature source Oral, resp. rate 18, height '5\' 8"'$  (1.727 m), weight 93.4 kg (206 lb), SpO2 100 %.Body mass index is 31.32 kg/m.  General Appearance: Casual  Eye Contact:  Good  Speech:  Clear and Coherent  Volume:  Normal  Mood:  Depressed and Irritable  Affect:  Congruent  Thought  Process:  Goal Directed and Descriptions of Associations: Intact  Orientation:  Full (Time, Place, and Person)  Thought Content:  WDL  Suicidal Thoughts:  No  Homicidal Thoughts:  No  Memory:  Immediate;   Fair Recent;   Fair Remote;   Fair  Judgement:  Poor  Insight:  Lacking  Psychomotor Activity:  Decreased  Concentration:  Concentration: Fair and Attention Span: Fair  Recall:  [employer] of Knowledge:  Fair  Language:  Fair  Akathisia:  No  Handed:  Right  AIMS (if indicated):     Assets:  Communication Skills Desire for Improvement Financial Resources/Insurance Resilience Social Support  ADL's:  Intact  Cognition:  WNL  Sleep:  Number of Hours: 7     Treatment Plan Summary: Daily contact with patient to assess and evaluate symptoms and progress in treatment and Medication management   Mr. Ress is a 51 year old male with history of depression and substance admitted for suicidal ideation in the context of relapse on cocaine and homelessness.  1. Suicidal ideation. The patient is able to contract for safety in the hospital.  2. Mood. We'll continue Celexa 20 mg daily.  3. HIV. We continue antiretrovirals. The patient follows up with [facility] ID clinic. Will order CD4.  4. Hypertension. He is on ASA, lisinopril and Norvasc.  5. COPD. He is on albuterol.  6. Arthritis. He is on Mobic and Neurontin.  7. BPH. He is on Flomax.  8. Constipation. He is on Colace.  9. Weight loss. We offered an sure.  10. Allergies. He is on Afrin.  11. Cocaine abuse. The patient has a long history of cocaine use. He minimizes his problems and puts forward depression. He is not interested in substance abuse treatment.  12. Social. He spent all his money this month, he is homeless, he goes to jail on April 7 for a year.  13. Disposition. To be established. He follows up with [facility] infectious disease clinic at [facility] in [place].  Orson Slick, MD 02/22/2017, 12:11  PM

## 2017-02-22 NOTE — Progress Notes (Signed)
Recreation Therapy Notes  Date: 03.09.18 Time: 1:00 pm Location: Craft Room  Group Topic: Social Skills  Goal Area(s) Addresses:  Patient will effectively work with peer towards shared goal. Patient will identify skills used to make activity successful. Patient will identify benefit of using group skills effectively post d/c.  Behavioral Response: Attentive, Interactive  Intervention: [employer]  Activity: Patients were put in groups and given 15 pipe cleaners. Patients were instructed to build a free standing tower with all 15 pipe cleaners. Patients were given 2 minutes to strategize. After about 5 minutes of building, patient were instructed to put their dominant hand behind their backs. After about another 5 minutes of building, patients were instructed to stop talking to each other.  Education: LRT educated patients on healthy support systems.  Education Outcome: Acknowledges education/In group clarification offered  Clinical Observations/Feedback: Patient worked with peers to build tower. Patient used effective communication, teamwork, and problem solving skills. Patient contributed to group discussion by stating why these skills are important, and what would change for him if he started using these skills.  Jacquelynn Cree, LRT/CTRS 02/22/2017 2:07 PM

## 2017-02-22 NOTE — Tx Team (Signed)
Interdisciplinary Treatment and Diagnostic Plan Update  02/22/2017 Time of Session: 10:30 AM Park Liter [name] MRN: [id number]  Principal Diagnosis: Severe recurrent major depression without psychotic features (HCC)  Secondary Diagnoses: Principal Problem:   Severe recurrent major depression without psychotic features (HCC) Active Problems:   Tobacco use disorder   HIV disease (HCC)   HTN (hypertension)   BPH (benign prostatic hyperplasia)   Constipation   Cocaine use disorder, moderate, dependence (HCC)   Substance induced mood disorder (HCC)   Current Medications:  Current Facility-Administered Medications  Medication Dose Route Frequency Provider Last Rate Last Dose  . acetaminophen (TYLENOL) tablet 650 mg  650 mg Oral Q6H PRN Audery Amel, MD   650 mg at 02/21/17 0801  . albuterol (PROVENTIL HFA;VENTOLIN HFA) 108 (90 Base) MCG/ACT inhaler 2 puff  2 puff Inhalation Q4H PRN Audery Amel, MD      . alum & mag hydroxide-simeth (MAALOX/MYLANTA) 200-200-20 MG/5ML suspension 30 mL  30 mL Oral Q4H PRN Audery Amel, MD      . amLODipine (NORVASC) tablet 5 mg  5 mg Oral Daily Audery Amel, MD   5 mg at 02/22/17 0845  . aspirin EC tablet 81 mg  81 mg Oral Daily Audery Amel, MD   81 mg at 02/22/17 0845  . citalopram (CELEXA) tablet 20 mg  20 mg Oral Daily Audery Amel, MD   20 mg at 02/22/17 0845  . docusate sodium (COLACE) capsule 100 mg  100 mg Oral BID Audery Amel, MD   100 mg at 02/22/17 0845  . dolutegravir (TIVICAY) tablet 50 mg  50 mg Oral Daily Audery Amel, MD   50 mg at 02/22/17 0846  . emtricitabine-tenofovir AF (DESCOVY) 200-25 MG per tablet 1 tablet  1 tablet Oral Daily Audery Amel, MD   1 tablet at 02/22/17 0845  . feeding supplement (ENSURE ENLIVE) (ENSURE ENLIVE) liquid 237 mL  237 mL Oral TID BM Jolanta B Pucilowska, MD   237 mL at 02/22/17 1000  . gabapentin (NEURONTIN) capsule 300 mg  300 mg Oral TID Audery Amel, MD   300 mg at 02/22/17 0845  .  lisinopril (PRINIVIL,ZESTRIL) tablet 20 mg  20 mg Oral Daily Audery Amel, MD   20 mg at 02/22/17 0844  . magnesium hydroxide (MILK OF MAGNESIA) suspension 30 mL  30 mL Oral Daily PRN Audery Amel, MD      . meloxicam (MOBIC) tablet 7.5 mg  7.5 mg Oral Daily Audery Amel, MD   7.5 mg at 02/22/17 0845  . oxymetazoline (AFRIN) 0.05 % nasal spray 1 spray  1 spray Each Nare BID Shari Prows, MD   1 spray at 02/22/17 0846  . tamsulosin (FLOMAX) capsule 0.4 mg  0.4 mg Oral QPC supper Audery Amel, MD   0.4 mg at 02/21/17 1647   PTA Medications: Prescriptions Prior to Admission  Medication Sig Dispense Refill Last Dose  . acetaminophen (TYLENOL) 500 MG tablet Take 500 mg by mouth every 8 (eight) hours as needed.   02/16/2017 at Unknown time  . albuterol (PROVENTIL HFA;VENTOLIN HFA) 108 (90 Base) MCG/ACT inhaler Inhale 2 puffs into the lungs every 4 (four) hours as needed for shortness of breath. 1 Inhaler 0 02/16/2017 at Unknown time  . amLODipine (NORVASC) 5 MG tablet Take 1 tablet (5 mg total) by mouth daily. 30 tablet 0 02/16/2017 at Unknown time  . aspirin EC 81 MG tablet Take  1 tablet (81 mg total) by mouth daily. 30 tablet 0 02/16/2017 at Unknown time  . atorvastatin (LIPITOR) 40 MG tablet Take 1 tablet (40 mg total) by mouth daily at 6 PM. 30 tablet 0 02/16/2017 at Unknown time  . citalopram (CELEXA) 20 MG tablet Take 1 tablet (20 mg total) by mouth daily. 30 tablet 0 02/16/2017 at Unknown time  . docusate sodium (COLACE) 100 MG capsule Take 1 capsule (100 mg total) by mouth 2 (two) times daily. 60 capsule 0 02/16/2017 at Unknown time  . dolutegravir (TIVICAY) 50 MG tablet Take 1 tablet (50 mg total) by mouth daily. 30 tablet 0 02/16/2017 at Unknown time  . emtricitabine-tenofovir AF (DESCOVY) 200-25 MG tablet Take 1 tablet by mouth daily. (Patient not taking: Reported on 02/17/2017) 30 tablet 0 Not Taking at Unknown time  . ergocalciferol (VITAMIN D2) 50000 units capsule Take 50,000 Units by mouth  once a week.   Not Taking at Unknown time  . gabapentin (NEURONTIN) 300 MG capsule Take 1 capsule (300 mg total) by mouth 3 (three) times daily. (Patient not taking: Reported on 02/17/2017) 90 capsule 0 Not Taking at Unknown time  . lisinopril (PRINIVIL,ZESTRIL) 20 MG tablet Take 1 tablet (20 mg total) by mouth daily. 30 tablet 0 02/16/2017 at Unknown time  . meloxicam (MOBIC) 7.5 MG tablet Take 1 tablet (7.5 mg total) by mouth daily. With food 14 tablet 2 02/16/2017 at Unknown time  . polyethylene glycol (MIRALAX / GLYCOLAX) packet Take 17 g by mouth daily. 14 each 0 02/16/2017 at Unknown time  . predniSONE (DELTASONE) 10 MG tablet Take 1 tablet (10 mg total) by mouth daily. 6,5,4,3,2,1 six day taper 21 tablet 0   . tamsulosin (FLOMAX) 0.4 MG CAPS capsule Take 1 capsule (0.4 mg total) by mouth daily after breakfast. 30 capsule 0 02/16/2017 at Unknown time    Patient Stressors: Financial difficulties Legal issue Substance abuse  Patient Strengths: Motivation for treatment/growth Supportive family/friends  Treatment Modalities: Medication Management, Group therapy, Case management,  1 to 1 session with clinician, Psychoeducation, Recreational therapy.   Physician Treatment Plan for Primary Diagnosis: Severe recurrent major depression without psychotic features (HCC) Long Term Goal(s): Improvement in symptoms so as ready for discharge Improvement in symptoms so as ready for discharge   Short Term Goals: Ability to identify changes in lifestyle to reduce recurrence of condition will improve Ability to verbalize feelings will improve Ability to disclose and discuss suicidal ideas Ability to demonstrate self-control will improve Ability to identify and develop effective coping behaviors will improve Ability to maintain clinical measurements within normal limits will improve Compliance with prescribed medications will improve Ability to identify triggers associated with substance abuse/mental health  issues will improve Ability to identify changes in lifestyle to reduce recurrence of condition will improve Ability to demonstrate self-control will improve Ability to identify triggers associated with substance abuse/mental health issues will improve  Medication Management: Evaluate patient's response, side effects, and tolerance of medication regimen.  Therapeutic Interventions: 1 to 1 sessions, Unit Group sessions and Medication administration.  Evaluation of Outcomes: Progressing  Physician Treatment Plan for Secondary Diagnosis: Principal Problem:   Severe recurrent major depression without psychotic features (HCC) Active Problems:   Tobacco use disorder   HIV disease (HCC)   HTN (hypertension)   BPH (benign prostatic hyperplasia)   Constipation   Cocaine use disorder, moderate, dependence (HCC)   Substance induced mood disorder (HCC)  Long Term Goal(s): Improvement in symptoms so as ready for discharge  Improvement in symptoms so as ready for discharge   Short Term Goals: Ability to identify changes in lifestyle to reduce recurrence of condition will improve Ability to verbalize feelings will improve Ability to disclose and discuss suicidal ideas Ability to demonstrate self-control will improve Ability to identify and develop effective coping behaviors will improve Ability to maintain clinical measurements within normal limits will improve Compliance with prescribed medications will improve Ability to identify triggers associated with substance abuse/mental health issues will improve Ability to identify changes in lifestyle to reduce recurrence of condition will improve Ability to demonstrate self-control will improve Ability to identify triggers associated with substance abuse/mental health issues will improve     Medication Management: Evaluate patient's response, side effects, and tolerance of medication regimen.  Therapeutic Interventions: 1 to 1 sessions, Unit Group  sessions and Medication administration.  Evaluation of Outcomes: Progressing   RN Treatment Plan for Primary Diagnosis: Severe recurrent major depression without psychotic features (HCC) Long Term Goal(s): Knowledge of disease and therapeutic regimen to maintain health will improve  Short Term Goals: Ability to remain free from injury will improve, Ability to demonstrate self-control and Ability to disclose and discuss suicidal ideas  Medication Management: RN will administer medications as ordered by provider, will assess and evaluate patient's response and provide education to patient for prescribed medication. RN will report any adverse and/or side effects to prescribing provider.  Therapeutic Interventions: 1 on 1 counseling sessions, Psychoeducation, Medication administration, Evaluate responses to treatment, Monitor vital signs and CBGs as ordered, Perform/monitor CIWA, COWS, AIMS and Fall Risk screenings as ordered, Perform wound care treatments as ordered.  Evaluation of Outcomes: Progressing   LCSW Treatment Plan for Primary Diagnosis: Severe recurrent major depression without psychotic features (HCC) Long Term Goal(s): Safe transition to appropriate next level of care at discharge, Engage patient in therapeutic group addressing interpersonal concerns.  Short Term Goals: Engage patient in aftercare planning with referrals and resources, Increase ability to appropriately verbalize feelings and Increase emotional regulation  Therapeutic Interventions: Assess for all discharge needs, 1 to 1 time with Social worker, Explore available resources and support systems, Assess for adequacy in community support network, Educate family and significant other(s) on suicide prevention, Complete Psychosocial Assessment, Interpersonal group therapy.  Evaluation of Outcomes: Progressing    Recreational Therapy Treatment Plan for Primary Diagnosis: Severe recurrent major depression without psychotic  features (HCC) Long Term Goal(s): Patient will participate in recreation therapy treatment in at least 2 group sessions without prompting from LRT  Short Term Goals: Increase self-esteem, Increase health coping skills  Treatment Modalities: Group Therapy and Individual Treatment Sessions  Therapeutic Interventions: Psychoeducation  Evaluation of Outcomes: Progressing   Progress in Treatment: Attending groups: Yes. Participating in groups: Yes. Taking medication as prescribed: Yes. Toleration medication: Yes. Family/Significant other contact made: No, will contact:  Pt refused family contact. Patient understands diagnosis: Yes. Discussing patient identified problems/goals with staff: Yes. Medical problems stabilized or resolved: Yes. Denies suicidal/homicidal ideation: Yes. Issues/concerns per patient self-inventory: No.  New problem(s) identified: No, Describe:  None identified.  New Short Term/Long Term Goal(s): Pt stated that his goal is to "get back on track." He also would like to stop doing drugs as a long term goal.  Discharge Plan or Barriers: Pt will discharge home with his brother and follow-up with [facility].  Reason for Continuation of Hospitalization: Depression Suicidal ideation  Estimated Length of Stay: 2 days   Attendees: Patient: Roy Sondgeroth Sr. 02/22/2017 10:34 AM  Physician:  Dr. Kristine Linea, MD  02/22/2017 10:34 AM  Nursing: Leonia Reader, BSN, RN 02/22/2017 10:34 AM  RN Care Manager: 02/22/2017 10:34 AM  Social Worker: Hampton Abbot, MSW, LCSW-A 02/22/2017 10:34 AM  Recreational Therapist: Princella Ion, LRT/CTRS  02/22/2017 10:34 AM  Other:  02/22/2017 10:34 AM  Other:  02/22/2017 10:34 AM  Other: 02/22/2017 10:34 AM    Scribe for Treatment Team: Lynden Oxford, LCSWA 02/22/2017 10:34 AM

## 2017-02-22 NOTE — [facility] Group Notes (Signed)
[facility] LCSW Group Therapy Note  Date/Time: 02/22/17, 0930  Type of Therapy and Topic:  Group Therapy:  Feelings around Relapse and Recovery  Participation Level:  Did Not Attend   Mood:  Description of Group:    Patients in this group will discuss emotions they experience before and after a relapse. They will process how experiencing these feelings, or avoidance of experiencing them, relates to having a relapse. Facilitator will guide patients to explore emotions they have related to recovery. Patients will be encouraged to process which emotions are more powerful. They will be guided to discuss the emotional reaction significant others in their lives may have to patients' relapse or recovery. Patients will be assisted in exploring ways to respond to the emotions of others without this contributing to a relapse.  Therapeutic Goals: 1. Patient will identify two or more emotions that lead to relapse for them:  2. Patient will identify two emotions that result when they relapse:  3. Patient will identify two emotions related to recovery:  4. Patient will demonstrate ability to communicate their needs through discussion and/or role plays.   Summary of Patient Progress:     Therapeutic Modalities:   Cognitive Behavioral Therapy Solution-Focused Therapy Assertiveness Training Relapse Prevention Therapy  Greg Makenzy Krist, LCSW       

## 2017-02-22 NOTE — Progress Notes (Signed)
Recreation Therapy Notes  (Late Entry for 03.08.18)  Date: 03.08.18 Time: 1:00 pm Location: Craft Room  Group Topic: Leisure Education  Goal Area(s) Addresses:  Patient will identify activities for each letter of the alphabet. Patient will verbalize ability to integrate positive leisure into life post d/c. Patient will verbalized ability to use leisure as a Associate Professor.  Behavioral Response: Did not attend  Intervention: Leisure Alphabet  Activity: Patients were given a Leisure Information systems manager and were instructed to write healthy leisure activities for each letter of the alphabet.  Education: LRT educated patients on what they need to participate in leisure.  Education Outcome: Patient did not attend group.   Clinical Observations/Feedback: Patient did not attend group.  Jacquelynn Cree, LRT/CTRS 02/22/2017 8:11 AM

## 2017-02-22 NOTE — Progress Notes (Signed)
D: Pt denies SI/HI/AVH. Pt is pleasant and cooperative. Pt appears less anxious and he is interacting with peers and staff appropriately. Patient thought are organized. Patient stated he wanted his CD4 checked because he was feeling funny.  A: Pt was offered support and encouragement. Pt was given scheduled medications. Pt was encouraged to attend groups. Q 15 minute checks were done for safety.  R:Pt attends groups and interacts well with peers and staff. Pt is taking medication. Pt has no complaints.Pt receptive to treatment and safety maintained on unit.

## 2017-02-22 NOTE — Plan of Care (Signed)
Problem: Coping: Goal: Ability to verbalize feelings will improve Outcome: Progressing Patient verbalized feelings to staff.    

## 2017-02-22 NOTE — Plan of Care (Signed)
Problem: [facility] Participation in Recreation Therapeutic Interventions Goal: STG-Patient will demonstrate improved self esteem by identif STG: Self-Esteem - Within 4 treatment sessions, patient will verbalize at least 5 positive affirmation statements in each of 2 treatment sessions to increase self-esteem.  Outcome: Not Progressing Treatment Session 1; Completed 0 out of 1: At approximately 3:55 pm, LRT attempted treatment session. Patient reported he wanted to rest because he did not sleep last night.  Jacquelynn Cree, LRT/CTRS 03.09.18 4:37 pm Goal: STG-Patient will identify at least five coping skills for ** STG: Coping Skills - Within 4 treatment sessions, patient will verbalize at least 5 coping skills for substance abuse in each of 2 treatment sessions to decrease substance abuse.  Outcome: Not Progressing Treatment Session 1; Completed 0 out of 1: At approximately 3:55 pm, LRT attempted treatment session. Patient reported he wanted to rest because he did not sleep last night.  Jacquelynn Cree, LRT/CTRS 03.09.18 4:38 pm

## 2017-02-22 NOTE — Progress Notes (Signed)
Passive SI.  Affect sad.  Medication and group compliant.  Support and encouragement offered.  Safety maintained.

## 2017-02-23 LAB — T-HELPER CELLS CD4/CD8 %
% CD 4 POS. LYMPH.: 22 % — AB (ref 30.8–58.5)
ABSOLUTE CD 4 HELPER: 506 /uL (ref 359–1519)
BASOS: 0 %
Basophils Absolute: 0 10*3/uL (ref 0.0–0.2)
CD3+CD4+ CELLS/CD3+CD8+ CELLS BLD: 0.36 — AB (ref 0.92–3.72)
CD3+CD8+ CELLS NFR BLD: 60.6 % — AB (ref 12.0–35.5)
CD3+CD8+ Cells # Bld: 1394 /uL — ABNORMAL HIGH (ref 109–897)
EOS (ABSOLUTE): 0.1 10*3/uL (ref 0.0–0.4)
EOS: 2 %
Hematocrit: 39.7 % (ref 37.5–51.0)
Hemoglobin: 13.3 g/dL (ref 13.0–17.7)
IMMATURE GRANS (ABS): 0 10*3/uL (ref 0.0–0.1)
Immature Granulocytes: 1 %
LYMPHS: 43 %
Lymphocytes Absolute: 2.3 10*3/uL (ref 0.7–3.1)
MCH: 32 pg (ref 26.6–33.0)
MCHC: 33.5 g/dL (ref 31.5–35.7)
MCV: 95 fL (ref 79–97)
MONOCYTES: 7 %
Monocytes Absolute: 0.4 10*3/uL (ref 0.1–0.9)
Neutrophils Absolute: 2.4 10*3/uL (ref 1.4–7.0)
Neutrophils: 47 %
Platelets: 243 10*3/uL (ref 150–379)
RBC: 4.16 x10E6/uL (ref 4.14–5.80)
RDW: 13.9 % (ref 12.3–15.4)
WBC: 5.3 10*3/uL (ref 3.4–10.8)

## 2017-02-23 MED ORDER — GABAPENTIN 300 MG PO CAPS
300.0000 mg | ORAL_CAPSULE | Freq: Three times a day (TID) | ORAL | Status: DC
  Administered 2017-02-23 – 2017-02-24 (×4): 300 mg via ORAL
  Filled 2017-02-23 (×4): qty 1

## 2017-02-23 MED ORDER — TRAZODONE HCL 50 MG PO TABS
150.0000 mg | ORAL_TABLET | Freq: Every day | ORAL | Status: DC
  Administered 2017-02-23 – 2017-02-26 (×4): 150 mg via ORAL
  Filled 2017-02-23 (×4): qty 1

## 2017-02-23 MED ORDER — MELOXICAM 7.5 MG PO TABS
7.5000 mg | ORAL_TABLET | Freq: Two times a day (BID) | ORAL | Status: DC
  Administered 2017-02-23 – 2017-02-27 (×9): 7.5 mg via ORAL
  Filled 2017-02-23 (×9): qty 1

## 2017-02-23 NOTE — [facility] Group Notes (Signed)
[facility] LCSW Group Therapy  02/23/2017 3:32 PM  Type of Therapy:  Group Therapy  Participation Level:  Patient did not attend group. CSW invited patient to group.   Summary of Progress/Problems: Goal Setting: The objective is to set goals as they relate to the crisis in which they were admitted. Patients will be using SMART goal modalities to set measurable goals. Characteristics of realistic goals will be discussed and patients will be assisted in setting and processing how one will reach their goal. Facilitator will also assist patients in applying interventions and coping skills learned in psycho-education groups to the SMART goal and process how one will achieve defined goal.  Yaziel Brandon G. Garnette Czech MSW, LCSWA 02/23/2017, 3:32 PM

## 2017-02-23 NOTE — Progress Notes (Signed)
Continues to endorse passive SI.  Rates depression, anxiety and hopelessness as 2/2.  Visible in the milieu.  No inappropriate behavior noted.  Safety checks maintained.  Support and encouragement offered.

## 2017-02-23 NOTE — [facility] Group Notes (Signed)
[facility] Group Notes:  (Nursing/MHT/Case Management/Adjunct)  Date:  02/23/2017  Time:  2:47 AM  Type of Therapy:  Psychoeducational Skills  Participation Level:  Did Not Attend  Summary of Progress/Problems:  Chancy Milroy 02/23/2017, 2:47 AM

## 2017-02-23 NOTE — Progress Notes (Signed)
[facility] MD Progress Note  02/23/2017 10:28 AM Roy Zigmund Daniel Omdahl Sr.  MRN:  [id number]  Subjective:   Roy [name] has a long history of depression, mood instability, and substance. He relapsed on cocaine recently and came to the hospital suicidal. He has not been compliant with his medications including HIV treatment. He was restarted on all his medicines. He is able to return to his brother's house. He will go to prison for a year on April 9 which is his major stressor now. He also spent his money on cocaine.  02/22/2017. Roy [name]. He feels slightly better as he realizes that he can return to his brother's place. He seems to tolerate medications well. Sleep and appetite are good. He receives additional and as he claims to lose some weight. He complains of arthritis pain in his right leg that he now believes spread to his hip. This is an old problem. He is very worried about his HIV status as he was noncompliant and feels energetic. He is asking to get CD4 count. I would do it but the patient is going to [facility] ID clinic where he should follow up before his jail time.  3/10 patient continues to report having ideations. Patient says that he was unable to sleep last night. He denies hallucinations homicidal ideation. He continues to feel very depressed and worried about his HIV status. As far as physical complaints he reports that pain in his joints. No other physical complaints. Denies any side effects from medications  Per nursing: Passive SI.  Affect sad.  Medication and group compliant.  Support and encouragement offered.  Safety maintained.    Principal Problem: Severe recurrent major depression without psychotic features ([place]) Diagnosis:   Patient Active Problem List   Diagnosis Date Noted  . Cocaine use disorder, moderate, dependence ([place]) [F14.20] 02/19/2017  . Substance induced mood disorder ([place]) [F19.94] 02/19/2017  . Tobacco use disorder [F17.200] 10/26/2016  .  HIV disease ([place]) [B20] 10/26/2016  . HTN (hypertension) [I10] 10/26/2016  . Dyslipidemia [E78.5] 10/26/2016  . BPH (benign prostatic hyperplasia) [N40.0] 10/26/2016  . Constipation [K59.00] 10/26/2016  . Severe recurrent major depression without psychotic features ([place]) [F33.2] 10/25/2016   Total Time spent with patient: 20 minutes  Past Psychiatric History: substance abuse.  Past Medical History:  Past Medical History:  Diagnosis Date  . AIDS (acquired immune deficiency syndrome) ([place])   . Arthritis   . Asthma   . Bipolar disorder ([place])   . Bronchitis   . Depression   . GERD (gastroesophageal reflux disease)   . Hepatitis C   . HIV (human immunodeficiency virus infection) ([place])   . HTN (hypertension)     Past Surgical History:  Procedure Laterality Date  . HERNIA REPAIR    . TOE SURGERY     Family History: History reviewed. No pertinent family history. Family Psychiatric  History: see H&P. Social History:  History  Alcohol Use No     History  Drug Use  . Types: Cocaine    Social History   Social History  . Marital status: Divorced    Spouse name: N/A  . Number of children: N/A  . Years of education: N/A   Social History Main Topics  . Smoking status: Current Every Day Smoker    Types: Cigarettes  . Smokeless tobacco: Never Used  . Alcohol use No  . Drug use: Yes    Types: Cocaine  . Sexual activity: Yes   Other  Topics Concern  . None   Social History Narrative  . None   Additional Social History:    History of alcohol / drug use?: Yes Negative Consequences of Use: Financial, Legal, Personal relationships                    Sleep: Fair  Appetite:  Fair  Current Medications: Current Facility-Administered Medications  Medication Dose Route Frequency Provider Last Rate Last Dose  . acetaminophen (TYLENOL) tablet 650 mg  650 mg Oral Q6H PRN Gonzella Lex, MD   650 mg at 02/22/17 1240  . albuterol (PROVENTIL HFA;VENTOLIN HFA) 108 (90  Base) MCG/ACT inhaler 2 puff  2 puff Inhalation Q4H PRN Gonzella Lex, MD      . alum & mag hydroxide-simeth (MAALOX/MYLANTA) 200-200-20 MG/5ML suspension 30 mL  30 mL Oral Q4H PRN Gonzella Lex, MD      . amLODipine (NORVASC) tablet 5 mg  5 mg Oral Daily Gonzella Lex, MD   5 mg at 02/23/17 0814  . aspirin EC tablet 81 mg  81 mg Oral Daily Gonzella Lex, MD   81 mg at 02/23/17 2542  . citalopram (CELEXA) tablet 20 mg  20 mg Oral Daily Gonzella Lex, MD   20 mg at 02/23/17 0814  . docusate sodium (COLACE) capsule 100 mg  100 mg Oral BID Gonzella Lex, MD   100 mg at 02/23/17 7062  . dolutegravir (TIVICAY) tablet 50 mg  50 mg Oral Daily Gonzella Lex, MD   50 mg at 02/23/17 0814  . emtricitabine-tenofovir AF (DESCOVY) 200-25 MG per tablet 1 tablet  1 tablet Oral Daily Gonzella Lex, MD   1 tablet at 02/23/17 [phone]  . feeding supplement (ENSURE ENLIVE) (ENSURE ENLIVE) liquid 237 mL  237 mL Oral TID BM Hildred Priest, MD   237 mL at 02/22/17 2100  . gabapentin (NEURONTIN) capsule 300 mg  300 mg Oral TID Hildred Priest, MD      . lisinopril (PRINIVIL,ZESTRIL) tablet 20 mg  20 mg Oral Daily Gonzella Lex, MD   20 mg at 02/23/17 0814  . magnesium hydroxide (MILK OF MAGNESIA) suspension 30 mL  30 mL Oral Daily PRN Gonzella Lex, MD      . meloxicam (MOBIC) tablet 7.5 mg  7.5 mg Oral BID Hildred Priest, MD      . oxymetazoline (AFRIN) 0.05 % nasal spray 1 spray  1 spray Each Nare BID Clovis Fredrickson, MD   1 spray at 02/23/17 0814  . tamsulosin (FLOMAX) capsule 0.4 mg  0.4 mg Oral QPC supper Gonzella Lex, MD   0.4 mg at 02/22/17 1735  . traZODone (DESYREL) tablet 150 mg  150 mg Oral QHS Hildred Priest, MD        Lab Results: No results found for this or any previous visit (from the past 48 hour(s)).  Blood Alcohol level:  Lab Results  Component Value Date   ETH <5 02/19/2017   ETH <5 83/15/1761    Metabolic Disorder Labs: Lab Results   Component Value Date   HGBA1C 5.6 10/26/2016   MPG 114 10/26/2016   No results found for: PROLACTIN Lab Results  Component Value Date   CHOL 138 10/26/2016   TRIG 62 10/26/2016   HDL 57 10/26/2016   CHOLHDL 2.4 10/26/2016   VLDL 12 10/26/2016   LDLCALC 69 10/26/2016    Physical Findings: AIMS: Facial and Oral Movements Muscles of Facial Expression:  None, normal Lips and Perioral Area: None, normal Jaw: None, normal Tongue: None, normal,Extremity Movements Upper (arms, wrists, hands, fingers): None, normal Lower (legs, knees, ankles, toes): None, normal, Trunk Movements Neck, shoulders, hips: None, normal, Overall Severity Severity of abnormal movements (highest score from questions above): None, normal Incapacitation due to abnormal movements: None, normal [name] awareness of abnormal movements (rate only patient's report): No Awareness, Dental Status Current problems with teeth and/or dentures?: Yes Does patient usually wear dentures?: No  CIWA:    COWS:     Musculoskeletal: Strength & Muscle Tone: within normal limits Gait & Station: normal Patient leans: N/A  Psychiatric Specialty Exam: Physical Exam  Nursing note and vitals reviewed. Psychiatric: His speech is normal. He is withdrawn. Cognition and memory are normal. He expresses impulsivity. He exhibits a depressed mood. He expresses suicidal ideation. He expresses suicidal plans.    Review of Systems  Psychiatric/Behavioral: Positive for depression, substance abuse and suicidal ideas.  All other systems reviewed and are negative.   Blood pressure 130/80, pulse (!) 51, temperature 98 F (36.7 C), temperature source Oral, resp. rate 18, height 5' 8" (1.727 m), weight 93.4 kg (206 lb), SpO2 100 %.Body mass index is 31.32 kg/m.  General Appearance: Casual  Eye Contact:  Good  Speech:  Clear and Coherent  Volume:  Normal  Mood:  Depressed  Affect:  Congruent  Thought Process:  Goal Directed and  Descriptions of Associations: Intact  Orientation:  Full (Time, Place, and Person)  Thought Content:  WDL  Suicidal Thoughts:  Yes.  without intent/plan  Homicidal Thoughts:  No  Memory:  Immediate;   Fair Recent;   Fair Remote;   Fair  Judgement:  Poor  Insight:  Lacking  Psychomotor Activity:  Decreased  Concentration:  Concentration: Fair and Attention Span: Fair  Recall:  [employer] of Knowledge:  Fair  Language:  Fair  Akathisia:  No  Handed:  Right  AIMS (if indicated):     Assets:  Communication Skills Desire for Improvement Financial Resources/Insurance Resilience Social Support  ADL's:  Intact  Cognition:  WNL  Sleep:  Number of Hours: 8     Treatment Plan Summary: Daily contact with patient to assess and evaluate symptoms and progress in treatment and Medication management   Roy [name] is a 51 year old male with history of depression and substance admitted for suicidal ideation in the context of relapse on cocaine and homelessness.  1. Suicidal ideation. The patient is able to contract for safety in the hospital.  2. Mood. We'll continue Celexa 20 mg daily.  3. HIV. We continue antiretrovirals. The patient follows up with [facility] ID clinic. Will order CD4.  4. Hypertension. He is on ASA, lisinopril and Norvasc.  5. COPD. He is on albuterol.  6. Arthritis. He is on Mobic and Neurontin.  7. BPH. He is on Flomax.  8. Constipation. He is on Colace.  9. Weight loss. We offered an sure.  10. Allergies. He is on Afrin.  11. Cocaine abuse. The patient has a long history of cocaine use. He minimizes his problems and puts forward depression. He is not interested in substance abuse treatment.  12. Social. He spent all his money this month, he is homeless, he goes to jail on April 7 for a year.  13. Disposition. To be established. He follows up with [facility] infectious disease clinic at [facility] in [place].   3/10 I will increase his trazodone to 150 mg  daily at bedtime. 4  patient I will increase Mobic from once daily to twice a day.  No other changes patient appears to be slowly improving but still having passive suicidal ideation.  Hildred Priest, MD 02/23/2017, 10:28 AM

## 2017-02-24 MED ORDER — GABAPENTIN 300 MG PO CAPS
300.0000 mg | ORAL_CAPSULE | Freq: Every day | ORAL | Status: DC
  Administered 2017-02-25 – 2017-02-27 (×3): 300 mg via ORAL
  Filled 2017-02-24 (×3): qty 1

## 2017-02-24 MED ORDER — CITALOPRAM HYDROBROMIDE 20 MG PO TABS
20.0000 mg | ORAL_TABLET | Freq: Every day | ORAL | Status: DC
Start: 2017-02-25 — End: 2017-02-27
  Administered 2017-02-25 – 2017-02-26 (×2): 20 mg via ORAL
  Filled 2017-02-24 (×2): qty 1

## 2017-02-24 MED ORDER — ACETAMINOPHEN 500 MG PO TABS
1000.0000 mg | ORAL_TABLET | Freq: Four times a day (QID) | ORAL | Status: DC | PRN
  Administered 2017-02-25 – 2017-02-26 (×2): 1000 mg via ORAL
  Filled 2017-02-24 (×2): qty 2

## 2017-02-24 MED ORDER — GABAPENTIN 600 MG PO TABS
600.0000 mg | ORAL_TABLET | Freq: Every day | ORAL | Status: DC
  Administered 2017-02-24 – 2017-02-26 (×3): 600 mg via ORAL
  Filled 2017-02-24 (×4): qty 1

## 2017-02-24 NOTE — Progress Notes (Signed)
Pleasant and cooperative.  Denies SI/HI/AVH.  Affect bright.  Visible in the milieu.  Rates depression , anxiety and hopelessness as 2/10.  Medication and group compliant.  Safety checks maintained.  Support and encouragement offered.

## 2017-02-24 NOTE — Progress Notes (Signed)
Pleasant on approach, cooperative with treatment, he was medication compliant, he continues to endorse passive SI although. He rates depression, anxiety and hopelessness as 2/2.  Visible in the milieu, he spent most of the evening in the dayroom with peers .  No inappropriate behavior noted.  Safety checks maintained. Support and encouragement offered.

## 2017-02-24 NOTE — Progress Notes (Signed)
[facility] MD Progress Note  02/24/2017 12:49 PM Roy Zigmund Daniel Abascal Sr.  MRN:  [id number]  Subjective:   [name] has a long history of depression, mood instability, and substance. He relapsed on cocaine recently and came to the hospital suicidal. He has not been compliant with his medications including HIV treatment. He was restarted on all his medicines. He is able to return to his brother's house. He will go to prison for a year on April 9 which is his major stressor now. He also spent his money on cocaine.  02/22/2017. [name] met with treatment team today. He feels slightly better as he realizes that he can return to his brother's place. He seems to tolerate medications well. Sleep and appetite are good. He receives additional and as he claims to lose some weight. He complains of arthritis pain in his right leg that he now believes spread to his hip. This is an old problem. He is very worried about his HIV status as he was noncompliant and feels energetic. He is asking to get CD4 count. I would do it but the patient is going to [facility] ID clinic where he should follow up before his jail time.  3/10 patient continues to report having ideations. Patient says that he was unable to sleep last night. He denies hallucinations homicidal ideation. He continues to feel very depressed and worried about his HIV status. As far as physical complaints he reports that pain in his joints. No other physical complaints. Denies any side effects from medications  3/11 Patient reports he feels about the same. He spent most of yesterday sleeping, he sleeps during the day because he is unable to sleep at night. Reports poor mood, low energy, and lack of concentration/motivation. Does not have suicidal ideations today. Denies Hi/auditory/visual hallucinations. Appetite fluctuates, no side effects to meds, reports pain from arthritis in hip. Yesterday he found out his best friend since elementary school died from bursting a heart  vessel after drinking alcohol and mixing oxycontin. Having a difficult time coping. Has a court date on Mar 15th, was wondering if he would still be here by then and if so can we call the court and make them aware.  Per nursing:   Pleasant on approach, cooperative with treatment, he was medication compliant, he continues to endorse passive SI although. Herates depression, anxiety and hopelessness as 2/2. Visible in the milieu, he spent most of the evening in the dayroom with peers .  No inappropriate behavior noted. Safety checks maintained. Support and encouragement offered.   Continues to endorse passive SI.  Rates depression, anxiety and hopelessness as 2/2.  Visible in the milieu.  No inappropriate behavior noted.  Safety checks maintained.  Support and encouragement offered.    Principal Problem: Severe recurrent major depression without psychotic features ([place]) Diagnosis:   Patient Active Problem List   Diagnosis Date Noted  . Cocaine use disorder, moderate, dependence ([place]) [F14.20] 02/19/2017  . Substance induced mood disorder ([place]) [F19.94] 02/19/2017  . Tobacco use disorder [F17.200] 10/26/2016  . HIV disease ([place]) [B20] 10/26/2016  . HTN (hypertension) [I10] 10/26/2016  . Dyslipidemia [E78.5] 10/26/2016  . BPH (benign prostatic hyperplasia) [N40.0] 10/26/2016  . Constipation [K59.00] 10/26/2016  . Severe recurrent major depression without psychotic features ([place]) [F33.2] 10/25/2016   Total Time spent with patient: 20 minutes  Past Psychiatric History: substance abuse.  Past Medical History:  Past Medical History:  Diagnosis Date  . AIDS (acquired immune deficiency syndrome) ([place])   .  Arthritis   . Asthma   . Bipolar disorder ([place])   . Bronchitis   . Depression   . GERD (gastroesophageal reflux disease)   . Hepatitis C   . HIV (human immunodeficiency virus infection) ([place])   . HTN (hypertension)     Past Surgical History:  Procedure Laterality Date  . HERNIA  REPAIR    . TOE SURGERY     Family History: History reviewed. No pertinent family history. Family Psychiatric  History: see H&P. Social History:  History  Alcohol Use No     History  Drug Use  . Types: Cocaine    Social History   Social History  . Marital status: Divorced    Spouse name: N/A  . Number of children: N/A  . Years of education: N/A   Social History Main Topics  . Smoking status: Current Every Day Smoker    Types: Cigarettes  . Smokeless tobacco: Never Used  . Alcohol use No  . Drug use: Yes    Types: Cocaine  . Sexual activity: Yes   Other Topics Concern  . None   Social History Narrative  . None   Additional Social History:    History of alcohol / drug use?: Yes Negative Consequences of Use: Financial, Legal, Personal relationships                    Sleep: Fair  Appetite:  Fair  Current Medications: Current Facility-Administered Medications  Medication Dose Route Frequency Provider Last Rate Last Dose  . acetaminophen (TYLENOL) tablet 1,000 mg  1,000 mg Oral Q6H PRN Hildred Priest, MD      . albuterol (PROVENTIL HFA;VENTOLIN HFA) 108 (90 Base) MCG/ACT inhaler 2 puff  2 puff Inhalation Q4H PRN Gonzella Lex, MD      . alum & mag hydroxide-simeth (MAALOX/MYLANTA) 200-200-20 MG/5ML suspension 30 mL  30 mL Oral Q4H PRN Gonzella Lex, MD      . amLODipine (NORVASC) tablet 5 mg  5 mg Oral Daily Gonzella Lex, MD   5 mg at 02/24/17 0931  . aspirin EC tablet 81 mg  81 mg Oral Daily Gonzella Lex, MD   81 mg at 02/24/17 0932  . [START ON 02/25/2017] citalopram (CELEXA) tablet 20 mg  20 mg Oral QHS Hildred Priest, MD      . docusate sodium (COLACE) capsule 100 mg  100 mg Oral BID Gonzella Lex, MD   100 mg at 02/24/17 0930  . dolutegravir (TIVICAY) tablet 50 mg  50 mg Oral Daily Gonzella Lex, MD   50 mg at 02/24/17 0931  . emtricitabine-tenofovir AF (DESCOVY) 200-25 MG per tablet 1 tablet  1 tablet Oral Daily Gonzella Lex, MD   1 tablet at 02/24/17 0931  . feeding supplement (ENSURE ENLIVE) (ENSURE ENLIVE) liquid 237 mL  237 mL Oral TID BM Hildred Priest, MD   237 mL at 02/24/17 1000  . [START ON 02/25/2017] gabapentin (NEURONTIN) capsule 300 mg  300 mg Oral Daily Hildred Priest, MD      . gabapentin (NEURONTIN) tablet 600 mg  600 mg Oral QHS Hildred Priest, MD      . lisinopril (PRINIVIL,ZESTRIL) tablet 20 mg  20 mg Oral Daily Gonzella Lex, MD   20 mg at 02/24/17 0931  . magnesium hydroxide (MILK OF MAGNESIA) suspension 30 mL  30 mL Oral Daily PRN Gonzella Lex, MD      . meloxicam (MOBIC) tablet 7.5 mg  7.5 mg Oral BID Hildred Priest, MD   7.5 mg at 02/24/17 0931  . oxymetazoline (AFRIN) 0.05 % nasal spray 1 spray  1 spray Each Nare BID Clovis Fredrickson, MD   1 spray at 02/24/17 0710  . tamsulosin (FLOMAX) capsule 0.4 mg  0.4 mg Oral QPC supper Gonzella Lex, MD   0.4 mg at 02/23/17 1746  . traZODone (DESYREL) tablet 150 mg  150 mg Oral QHS Hildred Priest, MD   150 mg at 02/23/17 2219    Lab Results:  Results for orders placed or performed during the hospital encounter of 02/20/17 (from the past 48 hour(s))  T-helper cells CD4/CD8 %     Status: Abnormal   Collection Time: 02/22/17  2:59 PM  Result Value Ref Range   Absolute CD 4 Helper 506 359 - 1,519 /uL   % CD 4 Pos. Lymph. 22.0 (L) 30.8 - 58.5 %   CD3+CD8+ Cells # Bld 1,394 (H) 109 - 897 /uL   CD3+CD8+ Cells NFr Bld 60.6 (H) 12.0 - 35.5 %   CD3+CD4+ Cells/CD3+CD8+ Cells Bld 0.36 (L) 0.92 - 3.72   WBC 5.3 3.4 - 10.8 x10E3/uL   RBC 4.16 4.14 - 5.80 x10E6/uL   Hemoglobin 13.3 13.0 - 17.7 g/dL   Hematocrit 39.7 37.5 - 51.0 %   MCV 95 79 - 97 fL   MCH 32.0 26.6 - 33.0 pg   MCHC 33.5 31.5 - 35.7 g/dL   RDW 13.9 12.3 - 15.4 %   Platelets 243 150 - 379 x10E3/uL   Neutrophils 47 Not Estab. %   Lymphs 43 Not Estab. %   Monocytes 7 Not Estab. %   Eos 2 Not Estab. %   Basos 0 Not Estab.  %   Neutrophils Absolute 2.4 1.4 - 7.0 x10E3/uL   Lymphocytes Absolute 2.3 0.7 - 3.1 x10E3/uL   Monocytes Absolute 0.4 0.1 - 0.9 x10E3/uL   EOS (ABSOLUTE) 0.1 0.0 - 0.4 x10E3/uL   Basophils Absolute 0.0 0.0 - 0.2 x10E3/uL   Immature Granulocytes 1 Not Estab. %   Immature Grans (Abs) 0.0 0.0 - 0.1 x10E3/uL    Comment: (NOTE) Performed At: [facility] [address] [place], [place] [id number] Lindon Romp MD OQ:[id number]     Blood Alcohol level:  Lab Results  Component Value Date   [facility] <5 02/19/2017   ETH <5 54/65/6812    Metabolic Disorder Labs: Lab Results  Component Value Date   HGBA1C 5.6 10/26/2016   MPG 114 10/26/2016   No results found for: PROLACTIN Lab Results  Component Value Date   CHOL 138 10/26/2016   TRIG 62 10/26/2016   HDL 57 10/26/2016   CHOLHDL 2.4 10/26/2016   VLDL 12 10/26/2016   LDLCALC 69 10/26/2016    Physical Findings: AIMS: Facial and Oral Movements Muscles of Facial Expression: None, normal Lips and Perioral Area: None, normal Jaw: None, normal Tongue: None, normal,Extremity Movements Upper (arms, wrists, hands, fingers): None, normal Lower (legs, knees, ankles, toes): None, normal, Trunk Movements Neck, shoulders, hips: None, normal, Overall Severity Severity of abnormal movements (highest score from questions above): None, normal Incapacitation due to abnormal movements: None, normal [name] awareness of abnormal movements (rate only patient's report): No Awareness, Dental Status Current problems with teeth and/or dentures?: Yes Does patient usually wear dentures?: No  CIWA:    COWS:     Musculoskeletal: Strength & Muscle Tone: within normal limits Gait & Station: normal Patient leans: N/A  Psychiatric Specialty Exam: Physical  Exam  Nursing note and vitals reviewed. Constitutional: He is oriented to person, place, and time. He appears well-developed and well-nourished.  HENT:  Head: Normocephalic.  Eyes: EOM  are normal.  Musculoskeletal: Normal range of motion.  Neurological: He is alert and oriented to person, place, and time.  Psychiatric: His speech is normal. He is withdrawn. Cognition and memory are normal. He expresses impulsivity. He exhibits a depressed mood. He expresses suicidal ideation. He expresses suicidal plans.    Review of Systems  Musculoskeletal: Positive for joint pain.  Psychiatric/Behavioral: Positive for depression, substance abuse and suicidal ideas. The patient has insomnia.   All other systems reviewed and are negative.   Blood pressure 132/86, pulse (!) 48, temperature 98.4 F (36.9 C), temperature source Oral, resp. rate 18, height '5\' 8"'$  (1.727 m), weight 93.4 kg (206 lb), SpO2 100 %.Body mass index is 31.32 kg/m.  General Appearance: Casual  Eye Contact:  Good  Speech:  Clear and Coherent  Volume:  Normal  Mood:  Depressed  Affect:  Congruent  Thought Process:  Goal Directed and Descriptions of Associations: Intact  Orientation:  Full (Time, Place, and Person)  Thought Content:  WDL  Suicidal Thoughts:  Yes.  without intent/plan  Homicidal Thoughts:  No  Memory:  Immediate;   Fair Recent;   Fair Remote;   Fair  Judgement:  Fair  Insight:  Fair  Psychomotor Activity:  Normal  Concentration:  Concentration: Fair and Attention Span: Fair  Recall:  [employer] of Knowledge:  Fair  Language:  Fair  Akathisia:  No  Handed:  Right  AIMS (if indicated):     Assets:  Communication Skills Desire for Improvement Financial Resources/Insurance Resilience Social Support  ADL's:  Intact  Cognition:  WNL  Sleep:  Number of Hours: 5.5     Treatment Plan Summary: Daily contact with patient to assess and evaluate symptoms and progress in treatment and Medication management   Mr. Cromartie is a 51 year old male with history of depression and substance admitted for suicidal ideation in the context of relapse on cocaine and homelessness.  1. Suicidal ideation.  The patient is able to contract for safety in the hospital.  2. Mood. We'll continue Celexa 20 mg daily.  3. HIV. We continue antiretrovirals. The patient follows up with [facility] ID clinic. Will order CD4.  4. Hypertension. He is on ASA, lisinopril and Norvasc.  5. COPD. He is on albuterol.  6. Arthritis. He is on Mobic and Neurontin. Add tylenol 1000 mg q6 prn.  7. BPH. He is on Flomax.  8. Constipation. He is on Colace.  9. Weight loss. We offered an sure.  10. Allergies. He is on Afrin.  11. Cocaine abuse. The patient has a long history of cocaine use. He minimizes his problems and puts forward depression. He is not interested in substance abuse treatment.  12. Social. He spent all his money this month, he is homeless, he goes to jail on April 7 for a year.  13. Disposition. To be established. He follows up with [facility] infectious disease clinic at [facility] in [place].   3/10 I will increase his trazodone to 150 mg daily at bedtime. 4 patient I will increase Mobic from once daily to twice a day.  No other changes patient appears to be slowly improving but still having passive suicidal ideation.  3/11 Decreased suicidal ideation today. Added tylenol 1000 mg q6 prn for pain from arthritis. Neurotin dosing changed from 300 mg tid to  300 mg in the AM and 600 mg at bedtime to aid with insomnia. Patient will start taking Celexa at bedtime instead.  Hildred Priest, MD 02/24/2017, 12:49 PM

## 2017-02-24 NOTE — [facility] Group Notes (Signed)
[facility] LCSW Group Therapy  02/24/2017 2:50 PM  Type of Therapy:  Group Therapy  Participation Level:  Active  Participation Quality:  Appropriate  Affect:  Appropriate  Cognitive:  Appropriate  Insight:  Developing/Improving  Engagement in Therapy:  Developing/Improving  Modes of Intervention:  Discussion, Education, Problem-solving, Reality Testing and Support  Summary of Progress/Problems: Communications: Patients identify how individuals communicate with one another appropriately and inappropriately. Patients will be guided to discuss their thoughts, feelings, and behaviors related to barriers when communicating. The group will process together ways to execute positive and appropriate communications.   Derrel Moore G. Garnette Czech MSW, LCSWA 02/24/2017, 2:50 PM

## 2017-02-24 NOTE — [facility] Group Notes (Signed)
[facility] Group Notes:  (Nursing/MHT/Case Management/Adjunct)  Date:  02/24/2017  Time:  4:03 AM  Type of Therapy:  Psychoeducational Skills  Participation Level:  Active  Participation Quality:  Appropriate  Affect:  Appropriate  Cognitive:  Appropriate  Insight:  Appropriate and Good  Engagement in Group:  Engaged  Modes of Intervention:  Discussion, Socialization and Support  Summary of Progress/Problems:  Chancy Milroy 02/24/2017, 4:03 AM

## 2017-02-25 NOTE — [facility] Group Notes (Signed)
[facility] Group Notes:  (Nursing/MHT/Case Management/Adjunct)  Date:  02/25/2017  Time:  11:57 PM  Type of Therapy:  Psychoeducational Skills  Participation Level:  Active  Participation Quality:  Appropriate  Affect:  Appropriate  Cognitive:  Alert  Insight:  Good  Engagement in Group:  Engaged  Modes of Intervention:  Activity  Summary of Progress/Problems:  Roy [name] 02/25/2017, 11:57 PM

## 2017-02-25 NOTE — Progress Notes (Signed)
[facility] MD Progress Note  02/25/2017 9:57 AM Roy Zigmund Daniel Abarca Sr.  MRN:  [id number]  Subjective:   [name] has a long history of depression, mood instability, and substance. He relapsed on cocaine recently and came to the hospital suicidal. He has not been compliant with his medications including HIV treatment. He was restarted on all his medicines. He is able to return to his brother's house. He will go to prison for a year on April 9 which is his major stressor now. He also spent his money on cocaine.  02/22/2017. [name] met with treatment team today. He feels slightly better as he realizes that he can return to his brother's place. He seems to tolerate medications well. Sleep and appetite are good. He receives additional and as he claims to lose some weight. He complains of arthritis pain in his right leg that he now believes spread to his hip. This is an old problem. He is very worried about his HIV status as he was noncompliant and feels energetic. He is asking to get CD4 count. I would do it but the patient is going to [facility] ID clinic where he should follow up before his jail time.  3/10 patient continues to report having ideations. Patient says that he was unable to sleep last night. He denies hallucinations homicidal ideation. He continues to feel very depressed and worried about his HIV status. As far as physical complaints he reports that pain in his joints. No other physical complaints. Denies any side effects from medications  3/11 Patient reports he feels about the same. He spent most of yesterday sleeping, he sleeps during the day because he is unable to sleep at night. Reports poor mood, low energy, and lack of concentration/motivation. Does not have suicidal ideations today. Denies Hi/auditory/visual hallucinations. Appetite fluctuates, no side effects to meds, reports pain from arthritis in hip. Yesterday he found out his best friend since elementary school died from bursting a heart  vessel after drinking alcohol and mixing oxycontin. Having a difficult time coping. Has a court date on Mar 15th, was wondering if he would still be here by then and if so can we call the court and make them aware.  02/25/2017. [name] feels very anxious this morning. There is a court date on Thursday for shoplifting. He feels that he must appear in court. She would like to be discharged as soon as possible but still reports suicidal ideation. He complains of hip and leg pain. He accepts medications and tolerates them well. He was in bed today and did not participate in group.   Per nursing: D: Pt passive SI-contracts for safety, denies HI/ AVH. Pt is pleasant and cooperative. Pt stated he was feeling better due to not having SI thoughts everyday  A: Pt was offered support and encouragement. Pt was given scheduled medications. Pt was encourage to attend groups. Q 15 minute checks were done for safety.   R: Pt is taking medication. .Pt receptive to treatment and safety maintained on unit.  Principal Problem: Severe recurrent major depression without psychotic features ([place]) Diagnosis:   Patient Active Problem List   Diagnosis Date Noted  . Cocaine use disorder, moderate, dependence ([place]) [F14.20] 02/19/2017  . Substance induced mood disorder ([place]) [F19.94] 02/19/2017  . Tobacco use disorder [F17.200] 10/26/2016  . HIV disease ([place]) [B20] 10/26/2016  . HTN (hypertension) [I10] 10/26/2016  . Dyslipidemia [E78.5] 10/26/2016  . BPH (benign prostatic hyperplasia) [N40.0] 10/26/2016  . Constipation [K59.00] 10/26/2016  .  Severe recurrent major depression without psychotic features ([place]) [F33.2] 10/25/2016   Total Time spent with patient: 20 minutes  Past Psychiatric History: substance abuse.  Past Medical History:  Past Medical History:  Diagnosis Date  . AIDS (acquired immune deficiency syndrome) ([place])   . Arthritis   . Asthma   . Bipolar disorder ([place])   . Bronchitis   .  Depression   . GERD (gastroesophageal reflux disease)   . Hepatitis C   . HIV (human immunodeficiency virus infection) ([place])   . HTN (hypertension)     Past Surgical History:  Procedure Laterality Date  . HERNIA REPAIR    . TOE SURGERY     Family History: History reviewed. No pertinent family history. Family Psychiatric  History: see H&P. Social History:  History  Alcohol Use No     History  Drug Use  . Types: Cocaine    Social History   Social History  . Marital status: Divorced    Spouse name: N/A  . Number of children: N/A  . Years of education: N/A   Social History Main Topics  . Smoking status: Current Every Day Smoker    Types: Cigarettes  . Smokeless tobacco: Never Used  . Alcohol use No  . Drug use: Yes    Types: Cocaine  . Sexual activity: Yes   Other Topics Concern  . None   Social History Narrative  . None   Additional Social History:    History of alcohol / drug use?: Yes Negative Consequences of Use: Financial, Legal, Personal relationships                    Sleep: Fair  Appetite:  Fair  Current Medications: Current Facility-Administered Medications  Medication Dose Route Frequency Provider Last Rate Last Dose  . acetaminophen (TYLENOL) tablet 1,000 mg  1,000 mg Oral Q6H PRN Hildred Priest, MD      . albuterol (PROVENTIL HFA;VENTOLIN HFA) 108 (90 Base) MCG/ACT inhaler 2 puff  2 puff Inhalation Q4H PRN Gonzella Lex, MD      . alum & mag hydroxide-simeth (MAALOX/MYLANTA) 200-200-20 MG/5ML suspension 30 mL  30 mL Oral Q4H PRN Gonzella Lex, MD   30 mL at 02/24/17 2146  . amLODipine (NORVASC) tablet 5 mg  5 mg Oral Daily Gonzella Lex, MD   5 mg at 02/25/17 0811  . aspirin EC tablet 81 mg  81 mg Oral Daily Gonzella Lex, MD   81 mg at 02/25/17 0811  . citalopram (CELEXA) tablet 20 mg  20 mg Oral QHS Hildred Priest, MD      . docusate sodium (COLACE) capsule 100 mg  100 mg Oral BID Gonzella Lex, MD   100 mg  at 02/25/17 0811  . dolutegravir (TIVICAY) tablet 50 mg  50 mg Oral Daily Gonzella Lex, MD   50 mg at 02/25/17 0811  . emtricitabine-tenofovir AF (DESCOVY) 200-25 MG per tablet 1 tablet  1 tablet Oral Daily Gonzella Lex, MD   1 tablet at 02/25/17 [phone]  . feeding supplement (ENSURE ENLIVE) (ENSURE ENLIVE) liquid 237 mL  237 mL Oral TID BM Hildred Priest, MD   237 mL at 02/25/17 1000  . gabapentin (NEURONTIN) capsule 300 mg  300 mg Oral Daily Hildred Priest, MD   300 mg at 02/25/17 0811  . gabapentin (NEURONTIN) tablet 600 mg  600 mg Oral QHS Hildred Priest, MD   600 mg at 02/24/17 2145  . lisinopril (PRINIVIL,ZESTRIL)  tablet 20 mg  20 mg Oral Daily Gonzella Lex, MD   20 mg at 02/25/17 1740  . magnesium hydroxide (MILK OF MAGNESIA) suspension 30 mL  30 mL Oral Daily PRN Gonzella Lex, MD      . meloxicam (MOBIC) tablet 7.5 mg  7.5 mg Oral BID Hildred Priest, MD   7.5 mg at 02/25/17 0811  . oxymetazoline (AFRIN) 0.05 % nasal spray 1 spray  1 spray Each Nare BID Jolanta B Pucilowska, MD   1 spray at 02/25/17 0700  . tamsulosin (FLOMAX) capsule 0.4 mg  0.4 mg Oral QPC supper Gonzella Lex, MD   0.4 mg at 02/24/17 1719  . traZODone (DESYREL) tablet 150 mg  150 mg Oral QHS Hildred Priest, MD   150 mg at 02/24/17 2145    Lab Results:  No results found for this or any previous visit (from the past 36 hour(s)).  Blood Alcohol level:  Lab Results  Component Value Date   ETH <5 02/19/2017   ETH <5 81/44/8185    Metabolic Disorder Labs: Lab Results  Component Value Date   HGBA1C 5.6 10/26/2016   MPG 114 10/26/2016   No results found for: PROLACTIN Lab Results  Component Value Date   CHOL 138 10/26/2016   TRIG 62 10/26/2016   HDL 57 10/26/2016   CHOLHDL 2.4 10/26/2016   VLDL 12 10/26/2016   LDLCALC 69 10/26/2016    Physical Findings: AIMS: Facial and Oral Movements Muscles of Facial Expression: None, normal Lips and Perioral  Area: None, normal Jaw: None, normal Tongue: None, normal,Extremity Movements Upper (arms, wrists, hands, fingers): None, normal Lower (legs, knees, ankles, toes): None, normal, Trunk Movements Neck, shoulders, hips: None, normal, Overall Severity Severity of abnormal movements (highest score from questions above): None, normal Incapacitation due to abnormal movements: None, normal [name] awareness of abnormal movements (rate only patient's report): No Awareness, Dental Status Current problems with teeth and/or dentures?: Yes Does patient usually wear dentures?: No  CIWA:    COWS:     Musculoskeletal: Strength & Muscle Tone: within normal limits Gait & Station: normal Patient leans: N/A  Psychiatric Specialty Exam: Physical Exam  Nursing note and vitals reviewed. Constitutional: He is oriented to person, place, and time. He appears well-developed and well-nourished.  HENT:  Head: Normocephalic.  Eyes: EOM are normal.  Musculoskeletal: Normal range of motion.  Neurological: He is alert and oriented to person, place, and time.  Psychiatric: His speech is normal. He is withdrawn. Cognition and memory are normal. He expresses impulsivity. He exhibits a depressed mood. He expresses suicidal ideation. He expresses suicidal plans.    Review of Systems  Musculoskeletal: Positive for joint pain.  Psychiatric/Behavioral: Positive for depression, substance abuse and suicidal ideas. The patient has insomnia.   All other systems reviewed and are negative.   Blood pressure (!) 146/96, pulse 62, temperature 98.9 F (37.2 C), temperature source Oral, resp. rate 18, height '5\' 8"'$  (1.727 m), weight 93.4 kg (206 lb), SpO2 100 %.Body mass index is 31.32 kg/m.  General Appearance: Casual  Eye Contact:  Good  Speech:  Clear and Coherent  Volume:  Normal  Mood:  Depressed  Affect:  Congruent  Thought Process:  Goal Directed and Descriptions of Associations: Intact  Orientation:  Full (Time,  Place, and Person)  Thought Content:  WDL  Suicidal Thoughts:  Yes.  without intent/plan  Homicidal Thoughts:  No  Memory:  Immediate;   Fair Recent;   Fair Remote;  Fair  Judgement:  Fair  Insight:  Fair  Psychomotor Activity:  Normal  Concentration:  Concentration: Fair and Attention Span: Fair  Recall:  [employer] of Knowledge:  Fair  Language:  Fair  Akathisia:  No  Handed:  Right  AIMS (if indicated):     Assets:  Communication Skills Desire for Improvement Financial Resources/Insurance Resilience Social Support  ADL's:  Intact  Cognition:  WNL  Sleep:  Number of Hours: 7.45     Treatment Plan Summary: Daily contact with patient to assess and evaluate symptoms and progress in treatment and Medication management   [name] is a 51 year old male with history of depression and substance admitted for suicidal ideation in the context of relapse on cocaine and homelessness.  1. Suicidal ideation. The patient is able to contract for safety in the hospital.  2. Mood. We'll continue Celexa 20 mg daily.  3. HIV. We continue antiretrovirals. The patient follows up with [facility] ID clinic. CD4 500.  4. Hypertension. He is on ASA, lisinopril and Norvasc.  5. COPD. He is on albuterol.  6. Arthritis. He is on Mobic and Neurontin. Add tylenol 1000 mg q6 prn.  7. BPH. He is on Flomax.  8. Constipation. He is on Colace.  9. Weight loss. We offered Ensure.  10. Allergies. He is on Afrin.  11. Cocaine abuse. The patient has a long history of cocaine use. He minimizes his problems and puts forward depression. He is not interested in substance abuse treatment.  12. Social. He spent all his money this month, he is homeless, he goes to jail on April 7 for a year. Cort date for shoplifting on 02/28/2017.  13. Insomnia. Trazodone is available.  Slept 8 hours.  14. Disposition. To be established. He follows up with [facility] infectious disease clinic at [facility] in  [place].        Orson Slick, MD 02/25/2017, 9:57 AM

## 2017-02-25 NOTE — Plan of Care (Signed)
Problem: Nutrition: Goal: Adequate nutrition will be maintained Outcome: Progressing Patient eating and drinking as well as taking supplements

## 2017-02-25 NOTE — Progress Notes (Signed)
Recreation Therapy Notes  Date: 03.12.18 Time: 1:00 pm Location: Craft Room  Group Topic: Wellness  Goal Area(s) Addresses:  Patient will identify at least one item per dimension of health. Patient will examine areas they are deficient in.  Behavioral Response: Attentive, Interactive  Intervention: 6 Dimensions of Health  Activity: Patients were given a definition sheet defining the 6 dimensions of health and a worksheet with each dimension listed. Patients were instructed to write items they were currently doing in each dimension.  Education: LRT educated patients on ways they can improve each dimension.  Education Outcome: Acknowledges education/In group clarification offered  Clinical Observations/Feedback: Patient wrote items in each dimension. Patient contributed to group discussion by stating areas he was giving enough attention to, areas he was not giving enough attention to, how this activity relates to his admission, how this activity relates to his d/c, and what would change for him if he was more aware of his wellness.  Jacquelynn Cree, LRT/CTRS 02/25/2017 1:53 PM

## 2017-02-25 NOTE — [facility] Group Notes (Signed)
[facility] LCSW Group Therapy Note  Date/Time: 02/21/17, 0930  Type of Therapy and Topic:  Group Therapy:  Overcoming Obstacles  Participation Level:  Pt came to group 5 minutes from the end.  CSW briefly engaged him in a summary of the topic and invited him to attend groups on time in the future.  Description of Group:    In this group patients will be encouraged to explore what they see as obstacles to their own wellness and recovery. They will be guided to discuss their thoughts, feelings, and behaviors related to these obstacles. The group will process together ways to cope with barriers, with attention given to specific choices patients can make. Each patient will be challenged to identify changes they are motivated to make in order to overcome their obstacles. This group will be process-oriented, with patients participating in exploration of their own experiences as well as giving and receiving support and challenge from other group members.  Therapeutic Goals: 1. Patient will identify personal and current obstacles as they relate to admission. 2. Patient will identify barriers that currently interfere with their wellness or overcoming obstacles.  3. Patient will identify feelings, thought process and behaviors related to these barriers. 4. Patient will identify two changes they are willing to make to overcome these obstacles:    Summary of Patient Progress      Therapeutic Modalities:   Cognitive Behavioral Therapy Solution Focused Therapy Motivational Interviewing Relapse Prevention Therapy  [employer], LCSW

## 2017-02-25 NOTE — Progress Notes (Signed)
Patient pleasant and cooperative with care. Med and group compliant. Appropriate with staff and peers. Denies SI, HI, AVH. No negative behaviors. No complaints voiced. Encouragement and support offered. Safety checks maintained. Pt receptive and remains safe on unit with q 15 min checks.

## 2017-02-25 NOTE — [facility] Group Notes (Signed)
[facility] Group Notes:  (Nursing/MHT/Case Management/Adjunct)  Date:  02/25/2017  Time:  4:26 PM  Type of Therapy:  Psychoeducational Skills  Participation Level:  Did Not Attend  Twanna Hy 02/25/2017, 4:26 PM

## 2017-02-25 NOTE — Plan of Care (Signed)
Problem: [facility] Participation in Recreation Therapeutic Interventions Goal: STG-Patient will demonstrate improved self esteem by identif STG: Self-Esteem - Within 4 treatment sessions, patient will verbalize at least 5 positive affirmation statements in each of 2 treatment sessions to increase self-esteem.  Outcome: Progressing Treatment Session 2; Completed 1 out of 2: At approximately 11:45 am, LRT met with patient in craft room. Patient verbalized 5 positive affirmation statements. Patient reported it felt "pretty good". LRT encouraged patient to continue saying positive affirmation statements.  Leonette Monarch, LRT/CTRS 03.12.18 3:59 pm Goal: STG-Patient will identify at least five coping skills for ** STG: Coping Skills - Within 4 treatment sessions, patient will verbalize at least 5 coping skills for substance abuse in each of 2 treatment sessions to decrease substance abuse.  Outcome: Progressing Treatment Session 2; Completed 2 out of 2: At approximately 11:45 am, LRT met with patient in craft room. Patient verbalized 5 coping skills for substance abuse. LRT educated patient on leisure and why it is important to implement into his schedule. LRT educated and provided patient with blank schedules to help him plan his day and try to avoid using substances. LRT educated patient on healthy support systems.  Leonette Monarch, LRT/CTRS 03.12.18 4:01 pm

## 2017-02-25 NOTE — Tx Team (Signed)
Interdisciplinary Treatment and Diagnostic Plan Update  02/25/2017 Time of Session: 10:30 AM Roy Liter Manwarren Sr. MRN: [id number]  Principal Diagnosis: Severe recurrent major depression without psychotic features (HCC)  Secondary Diagnoses: Principal Problem:   Severe recurrent major depression without psychotic features (HCC) Active Problems:   Tobacco use disorder   HIV disease (HCC)   HTN (hypertension)   BPH (benign prostatic hyperplasia)   Constipation   Cocaine use disorder, moderate, dependence (HCC)   Substance induced mood disorder (HCC)   Current Medications:  Current Facility-Administered Medications  Medication Dose Route Frequency Provider Last Rate Last Dose  . acetaminophen (TYLENOL) tablet 1,000 mg  1,000 mg Oral Q6H PRN Jimmy Footman, MD      . albuterol (PROVENTIL HFA;VENTOLIN HFA) 108 (90 Base) MCG/ACT inhaler 2 puff  2 puff Inhalation Q4H PRN Audery Amel, MD      . alum & mag hydroxide-simeth (MAALOX/MYLANTA) 200-200-20 MG/5ML suspension 30 mL  30 mL Oral Q4H PRN Audery Amel, MD   30 mL at 02/24/17 2146  . amLODipine (NORVASC) tablet 5 mg  5 mg Oral Daily Audery Amel, MD   5 mg at 02/25/17 0811  . aspirin EC tablet 81 mg  81 mg Oral Daily Audery Amel, MD   81 mg at 02/25/17 0811  . citalopram (CELEXA) tablet 20 mg  20 mg Oral QHS Jimmy Footman, MD      . docusate sodium (COLACE) capsule 100 mg  100 mg Oral BID Audery Amel, MD   100 mg at 02/25/17 0811  . dolutegravir (TIVICAY) tablet 50 mg  50 mg Oral Daily Audery Amel, MD   50 mg at 02/25/17 0811  . emtricitabine-tenofovir AF (DESCOVY) 200-25 MG per tablet 1 tablet  1 tablet Oral Daily Audery Amel, MD   1 tablet at 02/25/17 [phone]  . feeding supplement (ENSURE ENLIVE) (ENSURE ENLIVE) liquid 237 mL  237 mL Oral TID BM Jimmy Footman, MD   237 mL at 02/25/17 1000  . gabapentin (NEURONTIN) capsule 300 mg  300 mg Oral Daily Jimmy Footman, MD   300 mg  at 02/25/17 0811  . gabapentin (NEURONTIN) tablet 600 mg  600 mg Oral QHS Jimmy Footman, MD   600 mg at 02/24/17 2145  . lisinopril (PRINIVIL,ZESTRIL) tablet 20 mg  20 mg Oral Daily Audery Amel, MD   20 mg at 02/25/17 0811  . magnesium hydroxide (MILK OF MAGNESIA) suspension 30 mL  30 mL Oral Daily PRN Audery Amel, MD      . meloxicam (MOBIC) tablet 7.5 mg  7.5 mg Oral BID Jimmy Footman, MD   7.5 mg at 02/25/17 0811  . oxymetazoline (AFRIN) 0.05 % nasal spray 1 spray  1 spray Each Nare BID Jolanta B Pucilowska, MD   1 spray at 02/25/17 0700  . tamsulosin (FLOMAX) capsule 0.4 mg  0.4 mg Oral QPC supper Audery Amel, MD   0.4 mg at 02/24/17 1719  . traZODone (DESYREL) tablet 150 mg  150 mg Oral QHS Jimmy Footman, MD   150 mg at 02/24/17 2145   PTA Medications: Prescriptions Prior to Admission  Medication Sig Dispense Refill Last Dose  . acetaminophen (TYLENOL) 500 MG tablet Take 500 mg by mouth every 8 (eight) hours as needed.   02/16/2017 at Unknown time  . albuterol (PROVENTIL HFA;VENTOLIN HFA) 108 (90 Base) MCG/ACT inhaler Inhale 2 puffs into the lungs every 4 (four) hours as needed for shortness of breath. 1 Inhaler  0 02/16/2017 at Unknown time  . amLODipine (NORVASC) 5 MG tablet Take 1 tablet (5 mg total) by mouth daily. 30 tablet 0 02/16/2017 at Unknown time  . aspirin EC 81 MG tablet Take 1 tablet (81 mg total) by mouth daily. 30 tablet 0 02/16/2017 at Unknown time  . atorvastatin (LIPITOR) 40 MG tablet Take 1 tablet (40 mg total) by mouth daily at 6 PM. 30 tablet 0 02/16/2017 at Unknown time  . citalopram (CELEXA) 20 MG tablet Take 1 tablet (20 mg total) by mouth daily. 30 tablet 0 02/16/2017 at Unknown time  . docusate sodium (COLACE) 100 MG capsule Take 1 capsule (100 mg total) by mouth 2 (two) times daily. 60 capsule 0 02/16/2017 at Unknown time  . dolutegravir (TIVICAY) 50 MG tablet Take 1 tablet (50 mg total) by mouth daily. 30 tablet 0 02/16/2017 at Unknown  time  . emtricitabine-tenofovir AF (DESCOVY) 200-25 MG tablet Take 1 tablet by mouth daily. (Patient not taking: Reported on 02/17/2017) 30 tablet 0 Not Taking at Unknown time  . ergocalciferol (VITAMIN D2) 50000 units capsule Take 50,000 Units by mouth once a week.   Not Taking at Unknown time  . gabapentin (NEURONTIN) 300 MG capsule Take 1 capsule (300 mg total) by mouth 3 (three) times daily. (Patient not taking: Reported on 02/17/2017) 90 capsule 0 Not Taking at Unknown time  . lisinopril (PRINIVIL,ZESTRIL) 20 MG tablet Take 1 tablet (20 mg total) by mouth daily. 30 tablet 0 02/16/2017 at Unknown time  . meloxicam (MOBIC) 7.5 MG tablet Take 1 tablet (7.5 mg total) by mouth daily. With food 14 tablet 2 02/16/2017 at Unknown time  . polyethylene glycol (MIRALAX / GLYCOLAX) packet Take 17 g by mouth daily. 14 each 0 02/16/2017 at Unknown time  . predniSONE (DELTASONE) 10 MG tablet Take 1 tablet (10 mg total) by mouth daily. 6,5,4,3,2,1 six day taper 21 tablet 0   . tamsulosin (FLOMAX) 0.4 MG CAPS capsule Take 1 capsule (0.4 mg total) by mouth daily after breakfast. 30 capsule 0 02/16/2017 at Unknown time    Patient Stressors: Financial difficulties Legal issue Substance abuse  Patient Strengths: Motivation for treatment/growth Supportive family/friends  Treatment Modalities: Medication Management, Group therapy, Case management,  1 to 1 session with clinician, Psychoeducation, Recreational therapy.   Physician Treatment Plan for Primary Diagnosis: Severe recurrent major depression without psychotic features (HCC) Long Term Goal(s): Improvement in symptoms so as ready for discharge Improvement in symptoms so as ready for discharge   Short Term Goals: Ability to identify changes in lifestyle to reduce recurrence of condition will improve Ability to verbalize feelings will improve Ability to disclose and discuss suicidal ideas Ability to demonstrate self-control will improve Ability to identify and  develop effective coping behaviors will improve Ability to maintain clinical measurements within normal limits will improve Compliance with prescribed medications will improve Ability to identify triggers associated with substance abuse/mental health issues will improve Ability to identify changes in lifestyle to reduce recurrence of condition will improve Ability to demonstrate self-control will improve Ability to identify triggers associated with substance abuse/mental health issues will improve  Medication Management: Evaluate patient's response, side effects, and tolerance of medication regimen.  Therapeutic Interventions: 1 to 1 sessions, Unit Group sessions and Medication administration.  Evaluation of Outcomes: Progressing  Physician Treatment Plan for Secondary Diagnosis: Principal Problem:   Severe recurrent major depression without psychotic features (HCC) Active Problems:   Tobacco use disorder   HIV disease (HCC)   HTN (hypertension)  BPH (benign prostatic hyperplasia)   Constipation   Cocaine use disorder, moderate, dependence (HCC)   Substance induced mood disorder (HCC)  Long Term Goal(s): Improvement in symptoms so as ready for discharge Improvement in symptoms so as ready for discharge   Short Term Goals: Ability to identify changes in lifestyle to reduce recurrence of condition will improve Ability to verbalize feelings will improve Ability to disclose and discuss suicidal ideas Ability to demonstrate self-control will improve Ability to identify and develop effective coping behaviors will improve Ability to maintain clinical measurements within normal limits will improve Compliance with prescribed medications will improve Ability to identify triggers associated with substance abuse/mental health issues will improve Ability to identify changes in lifestyle to reduce recurrence of condition will improve Ability to demonstrate self-control will improve Ability  to identify triggers associated with substance abuse/mental health issues will improve     Medication Management: Evaluate patient's response, side effects, and tolerance of medication regimen.  Therapeutic Interventions: 1 to 1 sessions, Unit Group sessions and Medication administration.  Evaluation of Outcomes: Progressing   RN Treatment Plan for Primary Diagnosis: Severe recurrent major depression without psychotic features (HCC) Long Term Goal(s): Knowledge of disease and therapeutic regimen to maintain health will improve  Short Term Goals: Ability to remain free from injury will improve, Ability to demonstrate self-control and Ability to disclose and discuss suicidal ideas  Medication Management: RN will administer medications as ordered by provider, will assess and evaluate patient's response and provide education to patient for prescribed medication. RN will report any adverse and/or side effects to prescribing provider.  Therapeutic Interventions: 1 on 1 counseling sessions, Psychoeducation, Medication administration, Evaluate responses to treatment, Monitor vital signs and CBGs as ordered, Perform/monitor CIWA, COWS, AIMS and Fall Risk screenings as ordered, Perform wound care treatments as ordered.  Evaluation of Outcomes: Progressing   LCSW Treatment Plan for Primary Diagnosis: Severe recurrent major depression without psychotic features (HCC) Long Term Goal(s): Safe transition to appropriate next level of care at discharge, Engage patient in therapeutic group addressing interpersonal concerns.  Short Term Goals: Engage patient in aftercare planning with referrals and resources, Increase ability to appropriately verbalize feelings and Increase emotional regulation  Therapeutic Interventions: Assess for all discharge needs, 1 to 1 time with Social worker, Explore available resources and support systems, Assess for adequacy in community support network, Educate family and  significant other(s) on suicide prevention, Complete Psychosocial Assessment, Interpersonal group therapy.  Evaluation of Outcomes: Progressing    Recreational Therapy Treatment Plan for Primary Diagnosis: Severe recurrent major depression without psychotic features (HCC) Long Term Goal(s): Patient will participate in recreation therapy treatment in at least 2 group sessions without prompting from LRT  Short Term Goals: Increase self-esteem, Increase health coping skills  Treatment Modalities: Group Therapy and Individual Treatment Sessions  Therapeutic Interventions: Psychoeducation  Evaluation of Outcomes: Progressing   Progress in Treatment: Attending groups: Yes. Participating in groups: Yes. Taking medication as prescribed: Yes. Toleration medication: Yes. Family/Significant other contact made: No, will contact:  Pt refused family contact. Patient understands diagnosis: Yes. Discussing patient identified problems/goals with staff: Yes. Medical problems stabilized or resolved: Yes. Denies suicidal/homicidal ideation: Yes. Issues/concerns per patient self-inventory: No.  New problem(s) identified: No, Describe:  None identified.  New Short Term/Long Term Goal(s): Pt stated that his goal is to "get back on track." He also would like to stop doing drugs as a long term goal.  Discharge Plan or Barriers: Pt will discharge home with his brother and  follow-up with [facility].  Reason for Continuation of Hospitalization: Depression Suicidal ideation  Estimated Length of Stay: 2 days   Attendees: Patient: Roy Schwartz Sr. 02/25/2017 11:04 AM  Physician: Dr. Kristine Linea, MD  02/25/2017 11:04 AM  Nursing: Shelia Media, BSN, RN 02/25/2017 11:04 AM  RN Care Manager: 02/25/2017 11:04 AM  Social Worker: Hampton Abbot, MSW, LCSW-A 02/25/2017 11:04 AM  Recreational Therapist: Princella Ion, LRT/CTRS  02/25/2017 11:04 AM  Other:  02/25/2017 11:04 AM  Other:   02/25/2017 11:04 AM  Other: 02/25/2017 11:04 AM    Scribe for Treatment Team: Lynden Oxford, LCSWA 02/25/2017 11:04 AM

## 2017-02-25 NOTE — Progress Notes (Signed)
D: Pt passive SI-contracts for safety, denies HI/ AVH. Pt is pleasant and cooperative. Pt stated he was feeling better due to not having SI thoughts everyday  A: Pt was offered support and encouragement. Pt was given scheduled medications. Pt was encourage to attend groups. Q 15 minute checks were done for safety.   R: Pt is taking medication. .Pt receptive to treatment and safety maintained on unit.

## 2017-02-25 NOTE — [facility] Group Notes (Signed)
[facility] Group Notes:  (Nursing/MHT/Case Management/Adjunct)  Date:  02/25/2017  Time:  3:32 AM  Type of Therapy:  Group Therapy  Participation Level:  Did Not Attend   Veva Holes 02/25/2017, 3:32 AM

## 2017-02-26 MED ORDER — ATORVASTATIN CALCIUM 20 MG PO TABS
40.0000 mg | ORAL_TABLET | Freq: Every day | ORAL | Status: DC
  Administered 2017-02-26 – 2017-02-27 (×2): 40 mg via ORAL
  Filled 2017-02-26 (×2): qty 2

## 2017-02-26 MED ORDER — ERGOCALCIFEROL 1.25 MG (50000 UT) PO CAPS: 50000.0000 [IU] | ORAL_CAPSULE | ORAL | 1 refills | Status: DC

## 2017-02-26 MED ORDER — OXYMETAZOLINE HCL 0.05 % NA SOLN: 1.0000 | Freq: Two times a day (BID) | NASAL | 1 refills | Status: DC

## 2017-02-26 MED ORDER — TRAZODONE HCL 150 MG PO TABS: 150.0000 mg | ORAL_TABLET | Freq: Every day | ORAL | 1 refills | Status: DC

## 2017-02-26 MED ORDER — ASPIRIN EC 81 MG PO TBEC: 81.0000 mg | DELAYED_RELEASE_TABLET | Freq: Every day | ORAL | 1 refills | Status: DC

## 2017-02-26 MED ORDER — TAMSULOSIN HCL 0.4 MG PO CAPS: 0.4000 mg | ORAL_CAPSULE | Freq: Every day | ORAL | 1 refills | Status: DC

## 2017-02-26 MED ORDER — DOLUTEGRAVIR SODIUM 50 MG PO TABS: 50.0000 mg | ORAL_TABLET | Freq: Every day | ORAL | 1 refills | Status: DC

## 2017-02-26 MED ORDER — POLYETHYLENE GLYCOL 3350 17 G PO PACK: 17.0000 g | PACK | Freq: Every day | ORAL | 1 refills | Status: DC

## 2017-02-26 MED ORDER — ALBUTEROL SULFATE HFA 108 (90 BASE) MCG/ACT IN AERS: 2.0000 | INHALATION_SPRAY | RESPIRATORY_TRACT | 1 refills | Status: DC | PRN

## 2017-02-26 MED ORDER — MELOXICAM 7.5 MG PO TABS: 7.5000 mg | ORAL_TABLET | Freq: Two times a day (BID) | ORAL | 1 refills | Status: DC

## 2017-02-26 MED ORDER — ATORVASTATIN CALCIUM 40 MG PO TABS: 40.0000 mg | ORAL_TABLET | Freq: Every day | ORAL | 1 refills | Status: DC

## 2017-02-26 MED ORDER — EMTRICITABINE-TENOFOVIR AF 200-25 MG PO TABS: 1.0000 | ORAL_TABLET | Freq: Every day | ORAL | 1 refills | Status: DC

## 2017-02-26 MED ORDER — AMLODIPINE BESYLATE 5 MG PO TABS: 5.0000 mg | ORAL_TABLET | Freq: Every day | ORAL | 1 refills | Status: DC

## 2017-02-26 MED ORDER — LISINOPRIL 20 MG PO TABS: 20.0000 mg | ORAL_TABLET | Freq: Every day | ORAL | 1 refills | Status: DC

## 2017-02-26 MED ORDER — ACETAMINOPHEN 500 MG PO TABS: 500.0000 mg | ORAL_TABLET | Freq: Three times a day (TID) | ORAL | 1 refills | Status: DC | PRN

## 2017-02-26 MED ORDER — DOCUSATE SODIUM 100 MG PO CAPS: 100.0000 mg | ORAL_CAPSULE | Freq: Two times a day (BID) | ORAL | 1 refills | Status: DC

## 2017-02-26 MED ORDER — CITALOPRAM HYDROBROMIDE 20 MG PO TABS: 20.0000 mg | ORAL_TABLET | Freq: Every day | ORAL | 1 refills | Status: DC

## 2017-02-26 MED ORDER — GABAPENTIN 300 MG PO CAPS: 300.0000 mg | ORAL_CAPSULE | Freq: Three times a day (TID) | ORAL | 1 refills | Status: DC

## 2017-02-26 NOTE — [facility] Group Notes (Signed)
[facility] LCSW Group Therapy Note  Date/Time: 02/26/17, 0930  Type of Therapy/Topic:  Group Therapy:  Feelings about Diagnosis  Participation Level:  Did Not Attend   Mood:   Description of Group:    This group will allow patients to explore their thoughts and feelings about diagnoses they have received. Patients will be guided to explore their level of understanding and acceptance of these diagnoses. Facilitator will encourage patients to process their thoughts and feelings about the reactions of others to their diagnosis, and will guide patients in identifying ways to discuss their diagnosis with significant others in their lives. This group will be process-oriented, with patients participating in exploration of their own experiences as well as giving and receiving support and challenge from other group members.   Therapeutic Goals: 1. Patient will demonstrate understanding of diagnosis as evidence by identifying two or more symptoms of the disorder:  2. Patient will be able to express two feelings regarding the diagnosis 3. Patient will demonstrate ability to communicate their needs through discussion and/or role plays  Summary of Patient Progress:        Therapeutic Modalities:   Cognitive Behavioral Therapy Brief Therapy Feelings Identification   Greg Elantra Caprara, LCSW 

## 2017-02-26 NOTE — Plan of Care (Signed)
Problem: Safety: Goal: Ability to remain free from injury will improve Outcome: Not Met (add Reason) Calm and cooperative. Visible in dayroom. Attends group. Med compliant. C/o genl pain, ingestion, constipation, and nasal congestion. Tylenol 650 mg po, MOM 30 ml, Maalox 30 ml given as ordered PRN. No behavior issues noted. No voiced thoughts of harming himself. Safety maintained wq 15 min checks.    

## 2017-02-26 NOTE — Progress Notes (Signed)
Recreation Therapy Notes  Date: 03.13.18 Time: 3:00 pm Location: Craft Room  Group Topic: Self-expression  Goal Area(s) Addresses:  Patient will be able to identify a color that represents each emotion. Patient will verbalize benefit of using art as a means of self-expression. Patient will verbalize one emotion experienced while participating in activity.  Behavioral Response: Attentive, Interactive  Intervention: The Colors Within Me  Activity: Patients were given a blank face worksheet and were instructed to pick a color for each emotion they were feeling and show on the worksheet how much of that emotion they were feeling.  Education: LRT educated patients on other forms of self-expression.  Education Outcome: Acknowledges education/In group clarification offered  Clinical Observations/Feedback: Patient picked a color for each emotion he was feeling and showed on the worksheet how much of that emotion he was feeling. Patient contributed to group discussion by stating what emotions he was feeling, how his emotions affect his treatment in the hospital, how he sees his emotions changing once he is able to d/c, and what he can do to feel more positive.  Jacquelynn Cree, LRT/CTRS 02/26/2017 4:00 PM

## 2017-02-26 NOTE — Progress Notes (Signed)
[facility] MD Progress Note  02/26/2017 9:24 AM Katlin Zigmund Daniel Bentley Sr.  MRN:  [id number]  Subjective:   [name] has a long history of depression, mood instability, and substance. He relapsed on cocaine recently and came to the hospital suicidal. He has not been compliant with his medications including HIV treatment. He was restarted on all his medicines. He is able to return to his brother's house. He will go to prison for a year on April 9 which is his major stressor now. He also spent his money on cocaine.  02/22/2017. Mr. Welker met with treatment team today. He feels slightly better as he realizes that he can return to his brother's place. He seems to tolerate medications well. Sleep and appetite are good. He receives additional and as he claims to lose some weight. He complains of arthritis pain in his right leg that he now believes spread to his hip. This is an old problem. He is very worried about his HIV status as he was noncompliant and feels energetic. He is asking to get CD4 count. I would do it but the patient is going to [facility] ID clinic where he should follow up before his jail time.  3/10 patient continues to report having ideations. Patient says that he was unable to sleep last night. He denies hallucinations homicidal ideation. He continues to feel very depressed and worried about his HIV status. As far as physical complaints he reports that pain in his joints. No other physical complaints. Denies any side effects from medications  3/11 Patient reports he feels about the same. He spent most of yesterday sleeping, he sleeps during the day because he is unable to sleep at night. Reports poor mood, low energy, and lack of concentration/motivation. Does not have suicidal ideations today. Denies Hi/auditory/visual hallucinations. Appetite fluctuates, no side effects to meds, reports pain from arthritis in hip. Yesterday he found out his best friend since elementary school died from bursting a heart  vessel after drinking alcohol and mixing oxycontin. Having a difficult time coping. Has a court date on Mar 15th, was wondering if he would still be here by then and if so can we call the court and make them aware.  02/25/2017. Mr. Yeargan feels very anxious this morning. There is a court date on Thursday for shoplifting. He feels that he must appear in court. She would like to be discharged as soon as possible but still reports suicidal ideation. He complains of hip and leg pain. He accepts medications and tolerates them well. He was in bed today and did not participate in group.   02/26/2017. Mr. Woolsey has mostly somatic complaints today. Mood is better, anxiety is manageable, he is not psychotic, suicidal or homicidal. Discharge tomorrow.   Per nursing: Patient pleasant and cooperative with care. Med and group compliant. Appropriate with staff and peers. Denies SI, HI, AVH. No negative behaviors. No complaints voiced. Encouragement and support offered. Safety checks maintained. Pt receptive and remains safe on unit with q 15 min checks.  Principal Problem: Severe recurrent major depression without psychotic features ([place]) Diagnosis:   Patient Active Problem List   Diagnosis Date Noted  . Cocaine use disorder, moderate, dependence ([place]) [F14.20] 02/19/2017  . Substance induced mood disorder ([place]) [F19.94] 02/19/2017  . Tobacco use disorder [F17.200] 10/26/2016  . HIV disease ([place]) [B20] 10/26/2016  . HTN (hypertension) [I10] 10/26/2016  . Dyslipidemia [E78.5] 10/26/2016  . BPH (benign prostatic hyperplasia) [N40.0] 10/26/2016  . Constipation [K59.00] 10/26/2016  .  Severe recurrent major depression without psychotic features ([place]) [F33.2] 10/25/2016   Total Time spent with patient: 20 minutes  Past Psychiatric History: substance abuse.  Past Medical History:  Past Medical History:  Diagnosis Date  . AIDS (acquired immune deficiency syndrome) ([place])   . Arthritis   . Asthma   .  Bipolar disorder ([place])   . Bronchitis   . Depression   . GERD (gastroesophageal reflux disease)   . Hepatitis C   . HIV (human immunodeficiency virus infection) ([place])   . HTN (hypertension)     Past Surgical History:  Procedure Laterality Date  . HERNIA REPAIR    . TOE SURGERY     Family History: History reviewed. No pertinent family history. Family Psychiatric  History: see H&P. Social History:  History  Alcohol Use No     History  Drug Use  . Types: Cocaine    Social History   Social History  . Marital status: Divorced    Spouse name: N/A  . Number of children: N/A  . Years of education: N/A   Social History Main Topics  . Smoking status: Current Every Day Smoker    Types: Cigarettes  . Smokeless tobacco: Never Used  . Alcohol use No  . Drug use: Yes    Types: Cocaine  . Sexual activity: Yes   Other Topics Concern  . None   Social History Narrative  . None   Additional Social History:    History of alcohol / drug use?: Yes Negative Consequences of Use: Financial, Legal, Personal relationships                    Sleep: Fair  Appetite:  Fair  Current Medications: Current Facility-Administered Medications  Medication Dose Route Frequency Provider Last Rate Last Dose  . acetaminophen (TYLENOL) tablet 1,000 mg  1,000 mg Oral Q6H PRN Hildred Priest, MD   1,000 mg at 02/25/17 1946  . albuterol (PROVENTIL HFA;VENTOLIN HFA) 108 (90 Base) MCG/ACT inhaler 2 puff  2 puff Inhalation Q4H PRN Gonzella Lex, MD      . alum & mag hydroxide-simeth (MAALOX/MYLANTA) 200-200-20 MG/5ML suspension 30 mL  30 mL Oral Q4H PRN Gonzella Lex, MD   30 mL at 02/25/17 1946  . amLODipine (NORVASC) tablet 5 mg  5 mg Oral Daily Gonzella Lex, MD   5 mg at 02/26/17 0160  . aspirin EC tablet 81 mg  81 mg Oral Daily Gonzella Lex, MD   81 mg at 02/26/17 1093  . citalopram (CELEXA) tablet 20 mg  20 mg Oral QHS Hildred Priest, MD   20 mg at 02/25/17 2153   . docusate sodium (COLACE) capsule 100 mg  100 mg Oral BID Gonzella Lex, MD   100 mg at 02/26/17 0808  . dolutegravir (TIVICAY) tablet 50 mg  50 mg Oral Daily Gonzella Lex, MD   50 mg at 02/26/17 2355  . emtricitabine-tenofovir AF (DESCOVY) 200-25 MG per tablet 1 tablet  1 tablet Oral Daily Gonzella Lex, MD   1 tablet at 02/26/17 [phone]  . feeding supplement (ENSURE ENLIVE) (ENSURE ENLIVE) liquid 237 mL  237 mL Oral TID BM Hildred Priest, MD   237 mL at 02/26/17 1000  . gabapentin (NEURONTIN) capsule 300 mg  300 mg Oral Daily Hildred Priest, MD   300 mg at 02/26/17 0254  . gabapentin (NEURONTIN) tablet 600 mg  600 mg Oral QHS Hildred Priest, MD   600 mg at  02/25/17 2153  . lisinopril (PRINIVIL,ZESTRIL) tablet 20 mg  20 mg Oral Daily Gonzella Lex, MD   20 mg at 02/26/17 8921  . magnesium hydroxide (MILK OF MAGNESIA) suspension 30 mL  30 mL Oral Daily PRN Gonzella Lex, MD   30 mL at 02/25/17 2154  . meloxicam (MOBIC) tablet 7.5 mg  7.5 mg Oral BID Hildred Priest, MD   7.5 mg at 02/26/17 1941  . oxymetazoline (AFRIN) 0.05 % nasal spray 1 spray  1 spray Each Nare BID Clovis Fredrickson, MD   1 spray at 02/26/17 0808  . tamsulosin (FLOMAX) capsule 0.4 mg  0.4 mg Oral QPC supper Gonzella Lex, MD   0.4 mg at 02/25/17 1642  . traZODone (DESYREL) tablet 150 mg  150 mg Oral QHS Hildred Priest, MD   150 mg at 02/25/17 2153    Lab Results:  No results found for this or any previous visit (from the past 57 hour(s)).  Blood Alcohol level:  Lab Results  Component Value Date   ETH <5 02/19/2017   ETH <5 74/07/1447    Metabolic Disorder Labs: Lab Results  Component Value Date   HGBA1C 5.6 10/26/2016   MPG 114 10/26/2016   No results found for: PROLACTIN Lab Results  Component Value Date   CHOL 138 10/26/2016   TRIG 62 10/26/2016   HDL 57 10/26/2016   CHOLHDL 2.4 10/26/2016   VLDL 12 10/26/2016   LDLCALC 69 10/26/2016     Physical Findings: AIMS: Facial and Oral Movements Muscles of Facial Expression: None, normal Lips and Perioral Area: None, normal Jaw: None, normal Tongue: None, normal,Extremity Movements Upper (arms, wrists, hands, fingers): None, normal Lower (legs, knees, ankles, toes): None, normal, Trunk Movements Neck, shoulders, hips: None, normal, Overall Severity Severity of abnormal movements (highest score from questions above): None, normal Incapacitation due to abnormal movements: None, normal [name] awareness of abnormal movements (rate only patient's report): No Awareness, Dental Status Current problems with teeth and/or dentures?: Yes Does patient usually wear dentures?: No  CIWA:    COWS:     Musculoskeletal: Strength & Muscle Tone: within normal limits Gait & Station: normal Patient leans: N/A  Psychiatric Specialty Exam: Physical Exam  Nursing note and vitals reviewed. Constitutional: He is oriented to person, place, and time. He appears well-developed and well-nourished.  HENT:  Head: Normocephalic.  Eyes: EOM are normal.  Musculoskeletal: Normal range of motion.  Neurological: He is alert and oriented to person, place, and time.  Psychiatric: His speech is normal. He is withdrawn. Cognition and memory are normal. He expresses impulsivity. He exhibits a depressed mood. He expresses suicidal ideation. He expresses suicidal plans.    Review of Systems  Musculoskeletal: Positive for joint pain.  Psychiatric/Behavioral: Positive for depression, substance abuse and suicidal ideas. The patient has insomnia.   All other systems reviewed and are negative.   Blood pressure (!) 140/91, pulse (!) 47, temperature 98.3 F (36.8 C), temperature source Oral, resp. rate 20, height '5\' 8"'$  (1.727 m), weight 93.4 kg (206 lb), SpO2 98 %.Body mass index is 31.32 kg/m.  General Appearance: Casual  Eye Contact:  Good  Speech:  Clear and Coherent  Volume:  Normal  Mood:  Depressed   Affect:  Congruent  Thought Process:  Goal Directed and Descriptions of Associations: Intact  Orientation:  Full (Time, Place, and Person)  Thought Content:  WDL  Suicidal Thoughts:  Yes.  without intent/plan  Homicidal Thoughts:  No  Memory:  Immediate;   Fair Recent;   Fair Remote;   Fair  Judgement:  Fair  Insight:  Fair  Psychomotor Activity:  Normal  Concentration:  Concentration: Fair and Attention Span: Fair  Recall:  [employer] of Knowledge:  Fair  Language:  Fair  Akathisia:  No  Handed:  Right  AIMS (if indicated):     Assets:  Communication Skills Desire for Improvement Financial Resources/Insurance Resilience Social Support  ADL's:  Intact  Cognition:  WNL  Sleep:  Number of Hours: 6.45     Treatment Plan Summary: Daily contact with patient to assess and evaluate symptoms and progress in treatment and Medication management   Mr. Burciaga is a 51 year old male with history of depression and substance admitted for suicidal ideation in the context of relapse on cocaine and homelessness.  1. Suicidal ideation. The patient is able to contract for safety in the hospital.  2. Mood. We continude Celexa 20 mg daily.  3. HIV. We continue antiretrovirals. The patient follows up with [facility] ID clinic. CD4 500.  4. Hypertension. He is on ASA, lisinopril and Norvasc.  5. COPD. He is on albuterol.  6. Arthritis. He is on Mobic, Neurontin and Tylenol.  7. BPH. He is on Flomax.  8. Constipation. He is on Colace.  9. Weight loss. We offered Ensure.  10. Allergies. He is on Afrin.  11. Cocaine abuse. The patient has a long history of cocaine use. He minimizes his problems and puts forward depression as a main issue. He is not interested in substance abuse treatment.  12. Social. He goes to jail on April 7 for one year. There is court date for shoplifting on 02/28/2017.  13. Insomnia. Trazodone was available.    14. Disposition. He will be discharged  with his brither in time to attend court. He will follow up with [facility] infectious disease clinic and [facility] for substance abuse and medication management.         Orson Slick, MD 02/26/2017, 9:24 AM

## 2017-02-26 NOTE — Plan of Care (Signed)
Problem: [facility] Participation in Recreation Therapeutic Interventions Goal: STG-Patient will demonstrate improved self esteem by identif STG: Self-Esteem - Within 4 treatment sessions, patient will verbalize at least 5 positive affirmation statements in each of 2 treatment sessions to increase self-esteem.  Outcome: Completed/Met Date Met: 02/26/17 Treatment Session 3; Completed 2 out of 2: At approximately 11:40 am, LRT met with patient in craft room. Patient verbalized 5 positive affirmation statements. Patient reported it felt "great". LRT encouraged patient to continue saying positive affirmation statements.  Leonette Monarch, LRT/CTRS 03.13.18 2:26 pm Goal: STG-Patient will identify at least five coping skills for ** STG: Coping Skills - Within 4 treatment sessions, patient will verbalize at least 5 coping skills for substance abuse in each of 2 treatment sessions to decrease substance abuse.  Outcome: Completed/Met Date Met: 02/26/17 Treatment Session 3; Completed 2 out of 2: At approximately 11:40 am, LRT met with patient in craft room. Patient verbalized 5 coping skills for substance abuse. LRT encouraged patient to use his coping skills instead of using substances.  Leonette Monarch, LRT/CTRS 03.13.18 2:27 pm

## 2017-02-26 NOTE — Progress Notes (Signed)
Patient presents pleasant and cooperative with care. Appropriate with staff and peers. Denies SI, HI, AVH. Pt with orders to discharge tomorrow. Medication and group compliant. Pt noted in hallway with bloody tissue in hand this am. Reports nose dry and was bleeding.  Encouragement and support offered. Pt given nasal spray for dry nose. Medications given as prescribed. Safety checks maintained. Educated patient on safety and infection control practices. Pt verbalized understanding. Pt receptive and remains safe on unit with q 15 min checks.

## 2017-02-27 NOTE — Progress Notes (Signed)
  [facility] Adult Case Management Discharge Plan :  Will you be returning to the same living situation after discharge:  Yes,  home with brother. At discharge, do you have transportation home?: Yes,  pt's sister will pick pt up. Do you have the ability to pay for your medications: Yes,  Medicaid.  Release of information consent forms completed and in the chart;  [name] signature needed at discharge.  Patient to Follow up at: Follow-up Information    [facility]. Go on 03/01/2017.   Why:  Please plan to arrive to your appointment on March 16th at 9:30 AM. It is important that you arrive on time to ensure prompt servicers. If you have any questions or concerns, contact the clinic directly.  Contact information: Address: [address]  [place], [place] [address] Phone: [phone] Fax: [phone]          Next level of care provider has access to [facility] Link:no  Safety Planning and Suicide Prevention discussed: Yes,  SPE completed with patient.  Have you used any form of tobacco in the last 30 days? (Cigarettes, Smokeless Tobacco, Cigars, and/or Pipes): Yes  Has patient been referred to the Quitline?: Patient refused referral  Patient has been referred for addiction treatment: Pt. refused referral  Lynden Oxford, MSW, LCSW-A 02/27/2017, 9:26 AM

## 2017-02-27 NOTE — [facility] Group Notes (Signed)
[facility] Group Notes:  (Nursing/MHT/Case Management/Adjunct)  Date:  02/27/2017  Time:  12:35 AM  Type of Therapy:  Psychoeducational Skills  Participation Level:  Active  Participation Quality:  Appropriate  Affect:  Appropriate  Cognitive:  Appropriate  Insight:  Good  Engagement in Group:  Engaged  Modes of Intervention:  Activity  Summary of Progress/Problems:  Mayra Neer 02/27/2017, 12:35 AM

## 2017-02-27 NOTE — [facility] Suicide Risk Assessment (Signed)
[facility] Discharge Suicide Risk Assessment   Principal Problem: Severe recurrent major depression without psychotic features [facility]) Discharge Diagnoses:  Patient Active Problem List   Diagnosis Date Noted  . Cocaine use disorder, moderate, dependence ([facility]) [F14.20] 02/19/2017  . Substance induced mood disorder ([facility]) [F19.94] 02/19/2017  . Tobacco use disorder [F17.200] 10/26/2016  . HIV disease ([facility]) [B20] 10/26/2016  . HTN (hypertension) [I10] 10/26/2016  . Dyslipidemia [E78.5] 10/26/2016  . BPH (benign prostatic hyperplasia) [N40.0] 10/26/2016  . Constipation [K59.00] 10/26/2016  . Severe recurrent major depression without psychotic features ([facility]) [F33.2] 10/25/2016    Total Time spent with patient: 30 minutes  Musculoskeletal: Strength & Muscle Tone: within normal limits Gait & Station: normal Patient leans: N/A  Psychiatric Specialty Exam: Review of Systems  Musculoskeletal: Positive for joint pain.  Psychiatric/Behavioral: Positive for substance abuse.  All other systems reviewed and are negative.   Blood pressure (!) 143/88, pulse (!) 55, temperature 98.4 F (36.9 C), resp. rate 20, height 5\' 8"  (1.727 m), weight 93.4 kg (206 lb), SpO2 98 %.Body mass index is 31.32 kg/m.  General Appearance: Casual  Eye Contact::  Good  Speech:  Clear and Coherent409  Volume:  Normal  Mood:  Euthymic  Affect:  Appropriate  Thought Process:  Goal Directed and Descriptions of Associations: Intact  Orientation:  Full (Time, Place, and Person)  Thought Content:  WDL  Suicidal Thoughts:  No  Homicidal Thoughts:  No  Memory:  Immediate;   Fair Recent;   Fair Remote;   Fair  Judgement:  Impaired  Insight:  Shallow  Psychomotor Activity:  Normal  Concentration:  Fair  Recall:  [employer] of Knowledge:Fair  Language: Fair  Akathisia:  No  Handed:  Right  AIMS (if indicated):     Assets:  Communication Skills Desire for Improvement Financial  Resources/Insurance Housing Resilience Social Support  Sleep:  Number of Hours: 7  Cognition: WNL  ADL's:  Intact   Mental Status Per Nursing Assessment::   On Admission:  Suicidal ideation indicated by patient  Demographic Factors:  Male and Low socioeconomic status  Loss Factors: Loss of significant relationship, Decline in physical health, Legal issues and Financial problems/change in socioeconomic status  Historical Factors: Prior suicide attempts, Family history of mental illness or substance abuse and Impulsivity  Risk Reduction Factors:   Sense of responsibility to family, Living with another person, especially a relative, Positive social support and Positive therapeutic relationship  Continued Clinical Symptoms:  Depression:   Comorbid alcohol abuse/dependence Impulsivity Alcohol/Substance Abuse/Dependencies Chronic Pain Previous Psychiatric Diagnoses and Treatments Medical Diagnoses and Treatments/Surgeries  Cognitive Features That Contribute To Risk:  None    Suicide Risk:  Minimal: No identifiable suicidal ideation.  Patients presenting with no risk factors but with morbid ruminations; may be classified as minimal risk based on the severity of the depressive symptoms  Follow-up Information    [facility]. Go on 03/01/2017.   Why:  Please plan to arrive to your appointment on March 16th at 9:30 AM. It is important that you arrive on time to ensure prompt servicers. If you have any questions or concerns, contact the clinic directly.  Contact information: Address: [address]  [place], [place] [address] Phone: [phone] Fax: [phone]          Plan Of Care/Follow-up recommendations:  Activity:  As tolerated. Diet:  Low sodium heart healthy. Other:  Keep follow-up appointments.  Kristine Linea, MD 02/27/2017, 8:25 AM

## 2017-02-27 NOTE — Progress Notes (Signed)
Patient discharged home. DC instructions provided and explained. Medications reviewed. Rx given. All questions answered. Pt stable at discharge. Denies SI, Hi, AVH. Belongings returned.

## 2017-02-27 NOTE — [facility] Group Notes (Signed)
  [facility] LCSW Group Therapy Note  Date/Time: 02/27/17, 0930  Type of Therapy/Topic:  Group Therapy:  Emotion Regulation  Participation Level:  Did Not Attend   Mood:  Description of Group:    The purpose of this group is to assist patients in learning to regulate negative emotions and experience positive emotions. Patients will be guided to discuss ways in which they have been vulnerable to their negative emotions. These vulnerabilities will be juxtaposed with experiences of positive emotions or situations, and patients challenged to use positive emotions to combat negative ones. Special emphasis will be placed on coping with negative emotions in conflict situations, and patients will process healthy conflict resolution skills.  Therapeutic Goals: 1. Patient will identify two positive emotions or experiences to reflect on in order to balance out negative emotions:  2. Patient will label two or more emotions that they find the most difficult to experience:  3. Patient will be able to demonstrate positive conflict resolution skills through discussion or role plays:   Summary of Patient Progress:       Therapeutic Modalities:   Cognitive Behavioral Therapy Feelings Identification Dialectical Behavioral Therapy  Greg Carsyn Taubman, LCSW 

## 2017-02-27 NOTE — Tx Team (Signed)
Interdisciplinary Treatment and Diagnostic Plan Update  02/27/2017 Time of Session: 10:30 AM Roy Liter Kister Sr. MRN: [id number]  Principal Diagnosis: Severe recurrent major depression without psychotic features (HCC)  Secondary Diagnoses: Principal Problem:   Severe recurrent major depression without psychotic features (HCC) Active Problems:   Tobacco use disorder   HIV disease (HCC)   HTN (hypertension)   BPH (benign prostatic hyperplasia)   Constipation   Cocaine use disorder, moderate, dependence (HCC)   Substance induced mood disorder (HCC)   Current Medications:  Current Facility-Administered Medications  Medication Dose Route Frequency Provider Last Rate Last Dose  . acetaminophen (TYLENOL) tablet 1,000 mg  1,000 mg Oral Q6H PRN Jimmy Footman, MD   1,000 mg at 02/26/17 2004  . albuterol (PROVENTIL HFA;VENTOLIN HFA) 108 (90 Base) MCG/ACT inhaler 2 puff  2 puff Inhalation Q4H PRN Audery Amel, MD      . alum & mag hydroxide-simeth (MAALOX/MYLANTA) 200-200-20 MG/5ML suspension 30 mL  30 mL Oral Q4H PRN Audery Amel, MD   30 mL at 02/26/17 2004  . amLODipine (NORVASC) tablet 5 mg  5 mg Oral Daily Audery Amel, MD   5 mg at 02/27/17 0747  . aspirin EC tablet 81 mg  81 mg Oral Daily Audery Amel, MD   81 mg at 02/27/17 0748  . atorvastatin (LIPITOR) tablet 40 mg  40 mg Oral q1800 Jolanta B Pucilowska, MD   40 mg at 02/26/17 1716  . citalopram (CELEXA) tablet 20 mg  20 mg Oral QHS Jimmy Footman, MD   20 mg at 02/26/17 2143  . docusate sodium (COLACE) capsule 100 mg  100 mg Oral BID Audery Amel, MD   100 mg at 02/27/17 0747  . dolutegravir (TIVICAY) tablet 50 mg  50 mg Oral Daily Audery Amel, MD   50 mg at 02/27/17 0747  . emtricitabine-tenofovir AF (DESCOVY) 200-25 MG per tablet 1 tablet  1 tablet Oral Daily Audery Amel, MD   1 tablet at 02/27/17 0747  . feeding supplement (ENSURE ENLIVE) (ENSURE ENLIVE) liquid 237 mL  237 mL Oral TID BM  Jimmy Footman, MD   237 mL at 02/27/17 1000  . gabapentin (NEURONTIN) capsule 300 mg  300 mg Oral Daily Jimmy Footman, MD   300 mg at 02/27/17 0747  . gabapentin (NEURONTIN) tablet 600 mg  600 mg Oral QHS Jimmy Footman, MD   600 mg at 02/26/17 2143  . lisinopril (PRINIVIL,ZESTRIL) tablet 20 mg  20 mg Oral Daily Audery Amel, MD   20 mg at 02/27/17 0746  . magnesium hydroxide (MILK OF MAGNESIA) suspension 30 mL  30 mL Oral Daily PRN Audery Amel, MD   30 mL at 02/25/17 2154  . meloxicam (MOBIC) tablet 7.5 mg  7.5 mg Oral BID Jimmy Footman, MD   7.5 mg at 02/27/17 0747  . oxymetazoline (AFRIN) 0.05 % nasal spray 1 spray  1 spray Each Nare BID Shari Prows, MD   1 spray at 02/27/17 0747  . tamsulosin (FLOMAX) capsule 0.4 mg  0.4 mg Oral QPC supper Audery Amel, MD   0.4 mg at 02/26/17 1716  . traZODone (DESYREL) tablet 150 mg  150 mg Oral QHS Jimmy Footman, MD   150 mg at 02/26/17 2143   PTA Medications: Prescriptions Prior to Admission  Medication Sig Dispense Refill Last Dose  . meloxicam (MOBIC) 7.5 MG tablet Take 1 tablet (7.5 mg total) by mouth daily. With food 14 tablet 2  02/16/2017 at Unknown time  . predniSONE (DELTASONE) 10 MG tablet Take 1 tablet (10 mg total) by mouth daily. 6,5,4,3,2,1 six day taper 21 tablet 0   . [DISCONTINUED] acetaminophen (TYLENOL) 500 MG tablet Take 500 mg by mouth every 8 (eight) hours as needed.   02/16/2017 at Unknown time  . [DISCONTINUED] albuterol (PROVENTIL HFA;VENTOLIN HFA) 108 (90 Base) MCG/ACT inhaler Inhale 2 puffs into the lungs every 4 (four) hours as needed for shortness of breath. 1 Inhaler 0 02/16/2017 at Unknown time  . [DISCONTINUED] amLODipine (NORVASC) 5 MG tablet Take 1 tablet (5 mg total) by mouth daily. 30 tablet 0 02/16/2017 at Unknown time  . [DISCONTINUED] aspirin EC 81 MG tablet Take 1 tablet (81 mg total) by mouth daily. 30 tablet 0 02/16/2017 at Unknown time  .  [DISCONTINUED] atorvastatin (LIPITOR) 40 MG tablet Take 1 tablet (40 mg total) by mouth daily at 6 PM. 30 tablet 0 02/16/2017 at Unknown time  . [DISCONTINUED] citalopram (CELEXA) 20 MG tablet Take 1 tablet (20 mg total) by mouth daily. 30 tablet 0 02/16/2017 at Unknown time  . [DISCONTINUED] docusate sodium (COLACE) 100 MG capsule Take 1 capsule (100 mg total) by mouth 2 (two) times daily. 60 capsule 0 02/16/2017 at Unknown time  . [DISCONTINUED] dolutegravir (TIVICAY) 50 MG tablet Take 1 tablet (50 mg total) by mouth daily. 30 tablet 0 02/16/2017 at Unknown time  . [DISCONTINUED] emtricitabine-tenofovir AF (DESCOVY) 200-25 MG tablet Take 1 tablet by mouth daily. (Patient not taking: Reported on 02/17/2017) 30 tablet 0 Not Taking at Unknown time  . [DISCONTINUED] ergocalciferol (VITAMIN D2) 50000 units capsule Take 50,000 Units by mouth once a week.   Not Taking at Unknown time  . [DISCONTINUED] gabapentin (NEURONTIN) 300 MG capsule Take 1 capsule (300 mg total) by mouth 3 (three) times daily. (Patient not taking: Reported on 02/17/2017) 90 capsule 0 Not Taking at Unknown time  . [DISCONTINUED] lisinopril (PRINIVIL,ZESTRIL) 20 MG tablet Take 1 tablet (20 mg total) by mouth daily. 30 tablet 0 02/16/2017 at Unknown time  . [DISCONTINUED] polyethylene glycol (MIRALAX / GLYCOLAX) packet Take 17 g by mouth daily. 14 each 0 02/16/2017 at Unknown time  . [DISCONTINUED] tamsulosin (FLOMAX) 0.4 MG CAPS capsule Take 1 capsule (0.4 mg total) by mouth daily after breakfast. 30 capsule 0 02/16/2017 at Unknown time    Patient Stressors: Financial difficulties Legal issue Substance abuse  Patient Strengths: Motivation for treatment/growth Supportive family/friends  Treatment Modalities: Medication Management, Group therapy, Case management,  1 to 1 session with clinician, Psychoeducation, Recreational therapy.   Physician Treatment Plan for Primary Diagnosis: Severe recurrent major depression without psychotic features  (HCC) Long Term Goal(s): Improvement in symptoms so as ready for discharge Improvement in symptoms so as ready for discharge   Short Term Goals: Ability to identify changes in lifestyle to reduce recurrence of condition will improve Ability to verbalize feelings will improve Ability to disclose and discuss suicidal ideas Ability to demonstrate self-control will improve Ability to identify and develop effective coping behaviors will improve Ability to maintain clinical measurements within normal limits will improve Compliance with prescribed medications will improve Ability to identify triggers associated with substance abuse/mental health issues will improve Ability to identify changes in lifestyle to reduce recurrence of condition will improve Ability to demonstrate self-control will improve Ability to identify triggers associated with substance abuse/mental health issues will improve  Medication Management: Evaluate patient's response, side effects, and tolerance of medication regimen.  Therapeutic Interventions: 1 to 1 sessions,  Unit Group sessions and Medication administration.  Evaluation of Outcomes: Adequate for discharge.  Physician Treatment Plan for Secondary Diagnosis: Principal Problem:   Severe recurrent major depression without psychotic features (HCC) Active Problems:   Tobacco use disorder   HIV disease (HCC)   HTN (hypertension)   BPH (benign prostatic hyperplasia)   Constipation   Cocaine use disorder, moderate, dependence (HCC)   Substance induced mood disorder (HCC)  Long Term Goal(s): Improvement in symptoms so as ready for discharge Improvement in symptoms so as ready for discharge   Short Term Goals: Ability to identify changes in lifestyle to reduce recurrence of condition will improve Ability to verbalize feelings will improve Ability to disclose and discuss suicidal ideas Ability to demonstrate self-control will improve Ability to identify and develop  effective coping behaviors will improve Ability to maintain clinical measurements within normal limits will improve Compliance with prescribed medications will improve Ability to identify triggers associated with substance abuse/mental health issues will improve Ability to identify changes in lifestyle to reduce recurrence of condition will improve Ability to demonstrate self-control will improve Ability to identify triggers associated with substance abuse/mental health issues will improve     Medication Management: Evaluate patient's response, side effects, and tolerance of medication regimen.  Therapeutic Interventions: 1 to 1 sessions, Unit Group sessions and Medication administration.  Evaluation of Outcomes: Adequate for discharge.   RN Treatment Plan for Primary Diagnosis: Severe recurrent major depression without psychotic features (HCC) Long Term Goal(s): Knowledge of disease and therapeutic regimen to maintain health will improve  Short Term Goals: Ability to remain free from injury will improve, Ability to demonstrate self-control and Ability to disclose and discuss suicidal ideas  Medication Management: RN will administer medications as ordered by provider, will assess and evaluate patient's response and provide education to patient for prescribed medication. RN will report any adverse and/or side effects to prescribing provider.  Therapeutic Interventions: 1 on 1 counseling sessions, Psychoeducation, Medication administration, Evaluate responses to treatment, Monitor vital signs and CBGs as ordered, Perform/monitor CIWA, COWS, AIMS and Fall Risk screenings as ordered, Perform wound care treatments as ordered.  Evaluation of Outcomes: Adequate for discharge.   LCSW Treatment Plan for Primary Diagnosis: Severe recurrent major depression without psychotic features (HCC) Long Term Goal(s): Safe transition to appropriate next level of care at discharge, Engage patient in  therapeutic group addressing interpersonal concerns.  Short Term Goals: Engage patient in aftercare planning with referrals and resources, Increase ability to appropriately verbalize feelings and Increase emotional regulation  Therapeutic Interventions: Assess for all discharge needs, 1 to 1 time with Social worker, Explore available resources and support systems, Assess for adequacy in community support network, Educate family and significant other(s) on suicide prevention, Complete Psychosocial Assessment, Interpersonal group therapy.  Evaluation of Outcomes: Adequate for discharge.  Recreational Therapy Treatment Plan for Primary Diagnosis: Severe recurrent major depression without psychotic features (HCC) Long Term Goal(s): Patient will participate in recreation therapy treatment in at least 2 group sessions without prompting from LRT  Short Term Goals: Increase self-esteem, Increase health coping skills  Treatment Modalities: Group Therapy and Individual Treatment Sessions  Therapeutic Interventions: Psychoeducation  Evaluation of Outcomes: Adequate for discharge.  Progress in Treatment: Attending groups: Yes. Participating in groups: Yes. Taking medication as prescribed: Yes. Toleration medication: Yes. Family/Significant other contact made: No, will contact:  Pt refused family contact. Patient understands diagnosis: Yes. Discussing patient identified problems/goals with staff: Yes. Medical problems stabilized or resolved: Yes. Denies suicidal/homicidal ideation: Yes. Issues/concerns per  patient self-inventory: No.  New problem(s) identified: No, Describe:  None identified.  New Short Term/Long Term Goal(s): Pt stated that his goal is to "get back on track." He also would like to stop doing drugs as a long term goal.  Discharge Plan or Barriers: Pt will discharge home with his brother and follow-up with [facility].  Reason for Continuation of Hospitalization:  Depression Suicidal ideation  Estimated Length of Stay: D/C 02/27/2017  Attendees: Patient: Roy Proudfoot Sr. 02/27/2017 11:12 AM  Physician: Dr. Kristine Linea, MD  02/27/2017 11:12 AM  Nursing: Hulan Amato, RN 02/27/2017 11:12 AM  RN Care Manager: 02/27/2017 11:12 AM  Social Worker: Hampton Abbot, MSW, LCSW-A 02/27/2017 11:12 AM  Recreational Therapist: Princella Ion, LRT/CTRS  02/27/2017 11:12 AM  Other:  02/27/2017 11:12 AM  Other:  02/27/2017 11:12 AM  Other: 02/27/2017 11:12 AM    Scribe for Treatment Team: Lynden Oxford, LCSWA 02/27/2017 11:12 AM

## 2017-02-27 NOTE — Plan of Care (Signed)
Problem: Safety: Goal: Ability to remain free from injury will improve Outcome: Not Met (add Reason) Calm and cooperative. Flat affect. Attends group. Interacting with peers and staff. Med compliant.  C/o ingestion and joint pain. Maalox 30 ml and tylenol 650 mg po PRN. Med adm w/ education. Safety maintained w/ q 15 min checks.

## 2017-02-27 NOTE — Progress Notes (Signed)
Recreation Therapy Notes  Date: 03.14.18 Time: 1:00 pm Location: Craft Room  Group Topic: Self-esteem  Goal Area(s) Addresses:  Patient will write at least one positive trait about self. Patient will verbalize benefit of having healthy self-esteem.  Behavioral Response: Attentive, Interactive  Intervention: I Am  Activity: Patients were given a worksheet with the letter I on it and were instructed to write as many positive traits about themselves inside the letter.  Education: LRT educated patients on ways they can increase their self-esteem.  Education Outcome: Acknowledges education/In group clarification offered  Clinical Observations/Feedback: Patient wrote positive traits about self. Patient contributed to group discussion by stating it was difficult to think of positive traits and why, how his self-esteem affects him, and how he can increase his self-esteem.  Jacquelynn Cree, LRT/CTRS 02/27/2017 2:03 PM

## 2017-02-27 NOTE — Discharge Summary (Signed)
Physician Discharge Summary Note  Patient:  Roy Duggins Sr. is an 51 y.o., male MRN:  [id number] DOB:  January 23, 1966 Patient phone:  [phone] (home)  Patient address:   [address] [place] [address],  Total Time spent with patient: 30 minutes  Date of Admission:  02/20/2017 Date of Discharge: 02/27/2017  Reason for Admission:  Suicidal ideation.  Identifying data. [name] is a 51 year old male with history of depression, mood instability and substance abuse.   Chief complaint. "I lost my mother."  History of present illness. Information was obtained from the patient and the chart.The patient came to the emergency room. Days ago complaining of chest pain. He was ruled out for cardiac event and sent home after a few days. He returned to the hospital that same day this time complaining of suicidal ideation with a plan to overdose on medications. He reports that he is under considerable stress since his mother passed away in 12-06-2016. He promised his dying mother that he would do better but has not been able to keep his promises. He reports good compliance with medications but he has been getting increasingly depressed with poor sleep, decreased appetite, anhedonia, feeling of guilt and hopelessness worthlessness, poor energy and concentration social isolation crying spells that culminated in suicidal thinking. It is quite possible that he is malingering as he is now also homeless and without any money that he spent cocaine. He denies psychotic symptoms or symptoms suggestive of bipolar mania. He is not excessively anxious. He reports that he has been free of substances for the past month living with his brother. He recently relapsed on cocaine and may no longer stay with his brother. He denies alcohol use.    Past psychiatric history. The patient reports prior hospitalization for "crisis". He he was admitted to [facility] several times. His last  hospitalization was at [facility] in January in a very similar scenario. He is usually maintained on Celexa. He believes that Abilify gave him EPS. He believes that he has a diagnosis of bipolar illness. He reports suicidal attempts in the past.  Family psychiatric history. His son has substance abuse problems but is doing better. He just graduated from the [employer].  Social history. He is disabled from HIV . He has health insurance. Since his mother passed awa,y he does not have much support and not a place to stay. He has family in the area but they have not been supportive. Of note, the patient doesn't read or write. The patient has extensive legal history but has not been arrested in the past 8 years. He will go to jail for 1 year on March 23, 2017.  Principal Problem: Severe recurrent major depression without psychotic features [facility]) Discharge Diagnoses: Patient Active Problem List   Diagnosis Date Noted  . Cocaine use disorder, moderate, dependence (HCC) [F14.20] 02/19/2017  . Substance induced mood disorder (HCC) [F19.94] 02/19/2017  . Tobacco use disorder [F17.200] 10/26/2016  . HIV disease (HCC) [B20] 10/26/2016  . HTN (hypertension) [I10] 10/26/2016  . Dyslipidemia [E78.5] 10/26/2016  . BPH (benign prostatic hyperplasia) [N40.0] 10/26/2016  . Constipation [K59.00] 10/26/2016  . Severe recurrent major depression without psychotic features (HCC) [F33.2] 10/25/2016    Past Medical History:  Past Medical History:  Diagnosis Date  . AIDS (acquired immune deficiency syndrome) (HCC)   . Arthritis   . Asthma   . Bipolar disorder (HCC)   . Bronchitis   . Depression   . GERD (  gastroesophageal reflux disease)   . Hepatitis C   . HIV (human immunodeficiency virus infection) (HCC)   . HTN (hypertension)     Past Surgical History:  Procedure Laterality Date  . HERNIA REPAIR    . TOE SURGERY     Family History: History reviewed. No pertinent family history.  Social History:   History  Alcohol Use No     History  Drug Use  . Types: Cocaine    Social History   Social History  . Marital status: Divorced    Spouse name: N/A  . Number of children: N/A  . Years of education: N/A   Social History Main Topics  . Smoking status: Current Every Day Smoker    Types: Cigarettes  . Smokeless tobacco: Never Used  . Alcohol use No  . Drug use: Yes    Types: Cocaine  . Sexual activity: Yes   Other Topics Concern  . None   Social History Narrative  . None    Hospital Course:    Roy [name] is a 51 year old male with history of depression and substance admitted for suicidal ideation in the context of relapse on cocaine and homelessness.  1. Suicidal ideation. Resolved. The patient is able to contract for safety. He is forward thinking and more optimistic about the future.   2. Mood. We continued Celexa 20 mg daily.  3. HIV. We continued antiretrovirals. The patient follows up with [facility] ID clinic. CD4 500.  4. Hypertension. He is on ASA, Lisinopril and Norvasc.  5. COPD. He is on Albuterol.  6. Arthritis. He is on Mobic, Neurontin and Tylenol.  7. BPH. He is on Flomax.  8. Constipation. He is on Colace and Miralax.  9. Weight loss. We offered Ensure.  10. Allergies. He is on Afrin.  11. Cocaine abuse. The patient has a long history of cocaine use. He minimizes his problems and puts forward depression as a main issue. He is not interested in substance abuse treatment.  12. Social. He goes to jail on April 7 for one year. There is court date for shoplifting on 02/28/2017.  13. Insomnia. Trazodone was available.    14. Dyslipidemia. He is on Lipitor.   15. Disposition. He was discharged with his brither in time to attend court. He will follow up with [facility] infectious disease clinic.    Physical Findings: AIMS: Facial and Oral Movements Muscles of Facial Expression: None, normal Lips and Perioral Area: None, normal Jaw:  None, normal Tongue: None, normal,Extremity Movements Upper (arms, wrists, hands, fingers): None, normal Lower (legs, knees, ankles, toes): None, normal, Trunk Movements Neck, shoulders, hips: None, normal, Overall Severity Severity of abnormal movements (highest score from questions above): None, normal Incapacitation due to abnormal movements: None, normal [name] awareness of abnormal movements (rate only patient's report): No Awareness, Dental Status Current problems with teeth and/or dentures?: Yes Does patient usually wear dentures?: No  CIWA:    COWS:     Musculoskeletal: Strength & Muscle Tone: within normal limits Gait & Station: normal Patient leans: N/A  Psychiatric Specialty Exam: Physical Exam  Nursing note and vitals reviewed. Psychiatric: He has a normal mood and affect. His speech is normal and behavior is normal. Thought content normal. Cognition and memory are normal. He expresses impulsivity.    Review of Systems  Musculoskeletal: Positive for joint pain.  Psychiatric/Behavioral: Positive for substance abuse.  All other systems reviewed and are negative.   Blood pressure (!) 143/88, pulse (!) 55,  temperature 98.4 F (36.9 C), resp. rate 20, height 5\' 8"  (1.727 m), weight 93.4 kg (206 lb), SpO2 98 %.Body mass index is 31.32 kg/m.  General Appearance: Casual  Eye Contact:  Good  Speech:  Clear and Coherent  Volume:  Normal  Mood:  Euthymic  Affect:  Appropriate  Thought Process:  Goal Directed and Descriptions of Associations: Intact  Orientation:  Full (Time, Place, and Person)  Thought Content:  WDL  Suicidal Thoughts:  No  Homicidal Thoughts:  No  Memory:  Immediate;   Fair Recent;   Fair Remote;   Fair  Judgement:  Impaired  Insight:  Shallow  Psychomotor Activity:  Normal  Concentration:  Concentration: Fair and Attention Span: Fair  Recall:  [employer] of Knowledge:  Fair  Language:  Fair  Akathisia:  No  Handed:  Right  AIMS (if  indicated):     Assets:  Communication Skills Desire for Improvement Financial Resources/Insurance Housing Resilience Social Support  ADL's:  Intact  Cognition:  WNL  Sleep:  Number of Hours: 7     Have you used any form of tobacco in the last 30 days? (Cigarettes, Smokeless Tobacco, Cigars, and/or Pipes): Yes  Has this patient used any form of tobacco in the last 30 days? (Cigarettes, Smokeless Tobacco, Cigars, and/or Pipes) Yes, Yes, A prescription for an FDA-approved tobacco cessation medication was offered at discharge and the patient refused  Blood Alcohol level:  Lab Results  Component Value Date   [facility] <5 02/19/2017   ETH <5 12/19/2016    Metabolic Disorder Labs:  Lab Results  Component Value Date   HGBA1C 5.6 10/26/2016   MPG 114 10/26/2016   No results found for: PROLACTIN Lab Results  Component Value Date   CHOL 138 10/26/2016   TRIG 62 10/26/2016   HDL 57 10/26/2016   CHOLHDL 2.4 10/26/2016   VLDL 12 10/26/2016   LDLCALC 69 10/26/2016    See Psychiatric Specialty Exam and Suicide Risk Assessment completed by Attending Physician prior to discharge.  Discharge destination:  Home  Is patient on multiple antipsychotic therapies at discharge:  No   Has Patient had three or more failed trials of antipsychotic monotherapy by history:  No  Recommended Plan for Multiple Antipsychotic Therapies: NA  Discharge Instructions    Diet - low sodium heart healthy    Complete by:  As directed    Increase activity slowly    Complete by:  As directed      Allergies as of 02/27/2017      Reactions   Lactose Other (See Comments)   GI distress   Pollen Extract Itching      Medication List    STOP taking these medications   predniSONE 10 MG tablet Commonly known as:  DELTASONE     TAKE these medications     Indication  acetaminophen 500 MG tablet Commonly known as:  TYLENOL Take 1 tablet (500 mg total) by mouth every 8 (eight) hours as needed.  Indication:   Pain   albuterol 108 (90 Base) MCG/ACT inhaler Commonly known as:  PROVENTIL HFA;VENTOLIN HFA Inhale 2 puffs into the lungs every 4 (four) hours as needed for shortness of breath.  Indication:  Disease Involving Spasms of the Bronchus   amLODipine 5 MG tablet Commonly known as:  NORVASC Take 1 tablet (5 mg total) by mouth daily.  Indication:  High Blood Pressure Disorder   aspirin EC 81 MG tablet Take 1 tablet (81 mg total)  by mouth daily.  Indication:  Blood Clot   atorvastatin 40 MG tablet Commonly known as:  LIPITOR Take 1 tablet (40 mg total) by mouth daily at 6 PM.  Indication:  Ischemic Heart Disease   citalopram 20 MG tablet Commonly known as:  CELEXA Take 1 tablet (20 mg total) by mouth daily.  Indication:  Depression   docusate sodium 100 MG capsule Commonly known as:  COLACE Take 1 capsule (100 mg total) by mouth 2 (two) times daily.  Indication:  Constipation   dolutegravir 50 MG tablet Commonly known as:  TIVICAY Take 1 tablet (50 mg total) by mouth daily.  Indication:  HIV Disease   emtricitabine-tenofovir AF 200-25 MG tablet Commonly known as:  DESCOVY Take 1 tablet by mouth daily.  Indication:  HIV Disease   ergocalciferol 50000 units capsule Commonly known as:  VITAMIN D2 Take 1 capsule (50,000 Units total) by mouth once a week.  Indication:  Vitamin D Deficiency   gabapentin 300 MG capsule Commonly known as:  NEURONTIN Take 1 capsule (300 mg total) by mouth 3 (three) times daily.  Indication:  Neurogenic Pain   lisinopril 20 MG tablet Commonly known as:  PRINIVIL,ZESTRIL Take 1 tablet (20 mg total) by mouth daily.  Indication:  High Blood Pressure Disorder   meloxicam 7.5 MG tablet Commonly known as:  MOBIC Take 1 tablet (7.5 mg total) by mouth 2 (two) times daily. What changed:  when to take this  additional instructions  Indication:  Rheumatoid Arthritis   oxymetazoline 0.05 % nasal spray Commonly known as:  AFRIN Place 1 spray  into both nostrils 2 (two) times daily.  Indication:  Stuffy Nose   polyethylene glycol packet Commonly known as:  MIRALAX / GLYCOLAX Take 17 g by mouth daily.  Indication:  Constipation   tamsulosin 0.4 MG Caps capsule Commonly known as:  FLOMAX Take 1 capsule (0.4 mg total) by mouth daily after breakfast.  Indication:  Benign Enlargement of Prostate   traZODone 150 MG tablet Commonly known as:  DESYREL Take 1 tablet (150 mg total) by mouth at bedtime.  Indication:  Trouble Sleeping      Follow-up Information    [facility]. Go on 03/01/2017.   Why:  Please plan to arrive to your appointment on March 16th at 9:30 AM. It is important that you arrive on time to ensure prompt servicers. If you have any questions or concerns, contact the clinic directly.  Contact information: Address: [address]  [place], [place] [address] Phone: [phone] Fax: [phone]          Follow-up recommendations:  Activity:  As tolerated. Diet:  Low sodium heart healthy. Other:  Keep follow-up appointments.  Comments:    Signed: Kristine Linea, MD 02/27/2017, 11:49 AM

## 2017-02-28 NOTE — Progress Notes (Signed)
Recreation Therapy Notes  INPATIENT RECREATION TR PLAN  Patient Details Name: Kendricks Reap [facility] MRN: [id number] DOB: 15-Jul-1966 Today's Date: 02/28/2017  Rec Therapy Plan Is patient appropriate for Therapeutic Recreation?: Yes Treatment times per week: At least once a week TR Treatment/Interventions: 1:1 session, Group participation (Comment) (Appropriate participation in daily recreational therapy tx)  Discharge Criteria Pt will be discharged from therapy if:: Treatment goals are met, Discharged Treatment plan/goals/alternatives discussed and agreed upon by:: Patient/family  Discharge Summary Short term goals set: See Care Plan Short term goals met: Complete Progress toward goals comments: One-to-one attended Which groups?: Social skills, Wellness, Self-esteem, Other (Comment) (Self-expressions) One-to-one attended: Self-esteem, coping skills Reason goals not met: N/A Therapeutic equipment acquired: None Reason patient discharged from therapy: Discharge from hospital Pt/family agrees with progress & goals achieved: Yes Date patient discharged from therapy: 02/27/17   Leonette Monarch, LRT/CTRS 02/28/2017, 8:16 AM

## 2017-03-07 DIAGNOSIS — Z765 Malingerer [conscious simulation]: Secondary | ICD-10-CM | POA: Insufficient documentation

## 2017-03-09 ENCOUNTER — Encounter: Payer: Self-pay | Admitting: Emergency Medicine

## 2017-03-09 ENCOUNTER — Emergency Department: Payer: Medicaid Other

## 2017-03-09 ENCOUNTER — Emergency Department
Admission: EM | Admit: 2017-03-09 | Discharge: 2017-03-09 | Disposition: A | Discharge status: Home / Self Care | Payer: Medicaid Other | Attending: Emergency Medicine | Admitting: Emergency Medicine

## 2017-03-09 DIAGNOSIS — F1910 Other psychoactive substance abuse, uncomplicated: Secondary | ICD-10-CM | POA: Insufficient documentation

## 2017-03-09 DIAGNOSIS — J45909 Unspecified asthma, uncomplicated: Secondary | ICD-10-CM | POA: Diagnosis not present

## 2017-03-09 DIAGNOSIS — I10 Essential (primary) hypertension: Secondary | ICD-10-CM | POA: Diagnosis not present

## 2017-03-09 DIAGNOSIS — F17210 Nicotine dependence, cigarettes, uncomplicated: Secondary | ICD-10-CM | POA: Insufficient documentation

## 2017-03-09 DIAGNOSIS — Z5181 Encounter for therapeutic drug level monitoring: Secondary | ICD-10-CM | POA: Insufficient documentation

## 2017-03-09 DIAGNOSIS — Z21 Asymptomatic human immunodeficiency virus [HIV] infection status: Secondary | ICD-10-CM | POA: Insufficient documentation

## 2017-03-09 LAB — URINALYSIS, COMPLETE (UACMP) WITH MICROSCOPIC
BILIRUBIN URINE: NEGATIVE
Bacteria, UA: NONE SEEN
GLUCOSE, UA: NEGATIVE mg/dL
HGB URINE DIPSTICK: NEGATIVE
KETONES UR: NEGATIVE mg/dL
Leukocytes, UA: NEGATIVE
NITRITE: NEGATIVE
PROTEIN: NEGATIVE mg/dL
Specific Gravity, Urine: 1.024 (ref 1.005–1.030)
pH: 6 (ref 5.0–8.0)

## 2017-03-09 LAB — COMPREHENSIVE METABOLIC PANEL
ALT: 68 U/L — AB (ref 17–63)
AST: 39 U/L (ref 15–41)
Albumin: 4.7 g/dL (ref 3.5–5.0)
Alkaline Phosphatase: 62 U/L (ref 38–126)
Anion gap: 8 (ref 5–15)
BUN: 18 mg/dL (ref 6–20)
CHLORIDE: 102 mmol/L (ref 101–111)
CO2: 26 mmol/L (ref 22–32)
CREATININE: 1.02 mg/dL (ref 0.61–1.24)
Calcium: 9.5 mg/dL (ref 8.9–10.3)
GFR calc Af Amer: >60 mL/min (ref >60)
GFR calc non Af Amer: >60 mL/min (ref >60)
Glucose, Bld: 105 mg/dL — ABNORMAL HIGH (ref 65–99)
Potassium: 4.2 mmol/L (ref 3.5–5.1)
SODIUM: 136 mmol/L (ref 135–145)
Total Bilirubin: 0.7 mg/dL (ref 0.3–1.2)
Total Protein: 8.8 g/dL — ABNORMAL HIGH (ref 6.5–8.1)

## 2017-03-09 LAB — URINE DRUG SCREEN, QUALITATIVE (ARMC ONLY)
Amphetamines, Ur Screen: NOT DETECTED
BENZODIAZEPINE, UR SCRN: NOT DETECTED
Barbiturates, Ur Screen: NOT DETECTED
CANNABINOID 50 NG, UR ~~LOC~~: NOT DETECTED
Cocaine Metabolite,Ur ~~LOC~~: POSITIVE — AB
MDMA (Ecstasy)Ur Screen: NOT DETECTED
Methadone Scn, Ur: NOT DETECTED
Opiate, Ur Screen: NOT DETECTED
PHENCYCLIDINE (PCP) UR S: NOT DETECTED
Tricyclic, Ur Screen: NOT DETECTED

## 2017-03-09 LAB — CBC WITH DIFFERENTIAL/PLATELET
BASOS ABS: 0.1 10*3/uL (ref 0–0.1)
BASOS PCT: 1 %
EOS ABS: 0.1 10*3/uL (ref 0–0.7)
EOS PCT: 1 %
HCT: 46.2 % (ref 40.0–52.0)
Hemoglobin: 15.8 g/dL (ref 13.0–18.0)
LYMPHS ABS: 2.5 10*3/uL (ref 1.0–3.6)
Lymphocytes Relative: 31 %
MCH: 32.9 pg (ref 26.0–34.0)
MCHC: 34.3 g/dL (ref 32.0–36.0)
MCV: 95.9 fL (ref 80.0–100.0)
Monocytes Absolute: 0.6 10*3/uL (ref 0.2–1.0)
Monocytes Relative: 7 %
Neutro Abs: 5 10*3/uL (ref 1.4–6.5)
Neutrophils Relative %: 60 %
PLATELETS: 180 10*3/uL (ref 150–440)
RBC: 4.82 MIL/uL (ref 4.40–5.90)
RDW: 14 % (ref 11.5–14.5)
WBC: 8.3 10*3/uL (ref 3.8–10.6)

## 2017-03-09 LAB — ACETAMINOPHEN LEVEL

## 2017-03-09 LAB — ETHANOL: Alcohol, Ethyl (B): <5 mg/dL (ref <5)

## 2017-03-09 LAB — SALICYLATE LEVEL: Salicylate Lvl: <7 mg/dL (ref 2.8–30.0)

## 2017-03-09 NOTE — ED Triage Notes (Signed)
Pt reports he is ready to make a change in his life wants help with Detox, from drugs and alcohol. Pt reports drank alcohol last night and drinks every day about 4 (40) of beer, last time he used drugs last night cocaine

## 2017-03-09 NOTE — ED Notes (Addendum)
Per request of ER MD (McShane), writer provided the pt. with information and instructions on how to access Outpatient Mental Health & Substance Abuse Treatment  [facility] )   [address] [place], [place] [address] [phone]    Patient denies SI/HI and AV/H.

## 2017-03-09 NOTE — ED Notes (Signed)
TTS at bedside. 

## 2017-03-09 NOTE — ED Provider Notes (Addendum)
[facility] Emergency Department Provider Note  ____________________________________________   I have reviewed the triage vital signs and the nursing notes.   HISTORY  Chief Complaint Medical Clearance    HPI Roy Vandersloot Sr. is a 51 y.o. male Who presents today stating that he wants to have help with cocaine abuse. Denies chest pain or shortness of breath. He does  He denies any chills. He does have HIV. He states he is compliant with his medications. He is here for "medical clearance". Patient also takes alcohol. Denies history of SI or HI denies overdose, and denies any history of alcohol withdrawal.     Past Medical History:  Diagnosis Date  . AIDS (acquired immune deficiency syndrome) (HCC)   . Arthritis   . Asthma   . Bipolar disorder (HCC)   . Bronchitis   . Depression   . GERD (gastroesophageal reflux disease)   . Hepatitis C   . HIV (human immunodeficiency virus infection) (HCC)   . HTN (hypertension)     Patient Active Problem List   Diagnosis Date Noted  . Cocaine use disorder, moderate, dependence (HCC) 02/19/2017  . Substance induced mood disorder (HCC) 02/19/2017  . Tobacco use disorder 10/26/2016  . HIV disease (HCC) 10/26/2016  . HTN (hypertension) 10/26/2016  . Dyslipidemia 10/26/2016  . BPH (benign prostatic hyperplasia) 10/26/2016  . Constipation 10/26/2016  . Severe recurrent major depression without psychotic features (HCC) 10/25/2016    Past Surgical History:  Procedure Laterality Date  . HERNIA REPAIR    . TOE SURGERY      Prior to Admission medications   Medication Sig Start Date End Date Taking? Authorizing Provider  acetaminophen (TYLENOL) 500 MG tablet Take 1 tablet (500 mg total) by mouth every 8 (eight) hours as needed. 02/26/17   Shari Prows, MD  albuterol (PROVENTIL HFA;VENTOLIN HFA) 108 (90 Base) MCG/ACT inhaler Inhale 2 puffs into the lungs every 4 (four) hours as needed for shortness of  breath. 02/26/17   Shari Prows, MD  amLODipine (NORVASC) 5 MG tablet Take 1 tablet (5 mg total) by mouth daily. 02/26/17   Shari Prows, MD  aspirin EC 81 MG tablet Take 1 tablet (81 mg total) by mouth daily. 02/26/17   Shari Prows, MD  atorvastatin (LIPITOR) 40 MG tablet Take 1 tablet (40 mg total) by mouth daily at 6 PM. 02/26/17   Jolanta B Pucilowska, MD  citalopram (CELEXA) 20 MG tablet Take 1 tablet (20 mg total) by mouth daily. 02/26/17   Shari Prows, MD  docusate sodium (COLACE) 100 MG capsule Take 1 capsule (100 mg total) by mouth 2 (two) times daily. 02/26/17   Shari Prows, MD  dolutegravir (TIVICAY) 50 MG tablet Take 1 tablet (50 mg total) by mouth daily. 02/26/17   Shari Prows, MD  emtricitabine-tenofovir AF (DESCOVY) 200-25 MG tablet Take 1 tablet by mouth daily. 02/26/17   Shari Prows, MD  ergocalciferol (VITAMIN D2) 50000 units capsule Take 1 capsule (50,000 Units total) by mouth once a week. 02/26/17   Shari Prows, MD  gabapentin (NEURONTIN) 300 MG capsule Take 1 capsule (300 mg total) by mouth 3 (three) times daily. 02/26/17   Shari Prows, MD  lisinopril (PRINIVIL,ZESTRIL) 20 MG tablet Take 1 tablet (20 mg total) by mouth daily. 02/26/17   Shari Prows, MD  meloxicam (MOBIC) 7.5 MG tablet Take 1 tablet (7.5 mg total) by mouth 2 (two) times daily. 02/26/17  Shari Prows, MD  oxymetazoline (AFRIN) 0.05 % nasal spray Place 1 spray into both nostrils 2 (two) times daily. 02/26/17   Shari Prows, MD  polyethylene glycol (MIRALAX / GLYCOLAX) packet Take 17 g by mouth daily. 02/26/17   Shari Prows, MD  tamsulosin (FLOMAX) 0.4 MG CAPS capsule Take 1 capsule (0.4 mg total) by mouth daily after breakfast. 02/26/17   Jolanta B Pucilowska, MD  traZODone (DESYREL) 150 MG tablet Take 1 tablet (150 mg total) by mouth at bedtime. 02/26/17   Shari Prows, MD    Allergies Lactose and Pollen  extract  No family history on file.  Social History Social History  Substance Use Topics  . Smoking status: Current Every Day Smoker    Types: Cigarettes  . Smokeless tobacco: Current User  . Alcohol use 2.4 oz/week    4 Cans of beer per week    Review of Systems Constitutional: No fever/chills Eyes: No visual changes. ENT: No sore throat. No stiff neck no neck pain Cardiovascular: Denies chest pain. Respiratory: Denies shortness of breath. Gastrointestinal:   no vomiting.  No diarrhea.  No constipation. Genitourinary: Negative for dysuria. Musculoskeletal: Negative lower extremity swelling Skin: Negative for rash. Neurological: Negative for severe headaches, focal weakness or numbness. 10-point ROS otherwise negative.  ____________________________________________   PHYSICAL EXAM:  VITAL SIGNS: ED Triage Vitals  Enc Vitals Group     BP 03/09/17 1217 (!) 151/94     Pulse Rate 03/09/17 1217 74     Resp 03/09/17 1217 20     Temp 03/09/17 1217 98.6 F (37 C)     Temp Source 03/09/17 1217 Oral     SpO2 03/09/17 1217 96 %     Weight 03/09/17 1218 227 lb (103 kg)     Height 03/09/17 1218 5\' 9"  (1.753 m)     Head Circumference --      Peak Flow --      Pain Score 03/09/17 1218 6     Pain Loc --      Pain Edu? --      Excl. in GC? --     Constitutional: Alert and oriented. Well appearing and in no acute distress. Eyes: Conjunctivae are normal. PERRL. EOMI. Head: Atraumatic. Nose: No congestion/rhinnorhea. Mouth/Throat: Mucous membranes are moist.  Oropharynx non-erythematous. Neck: No stridor.   Nontender with no meningismus Cardiovascular: Normal rate, regular rhythm. Grossly normal heart sounds.  Good peripheral circulation. Respiratory: Normal respiratory effort.  No retractions. Lungs CTAB. Abdominal: Soft and nontender. No distention. No guarding no rebound Back:  There is no focal tenderness or step off.  there is no midline tenderness there are no lesions  noted. there is no CVA tenderness Musculoskeletal: No lower extremity tenderness, no upper extremity tenderness. No joint effusions, no DVT signs strong distal pulses no edema Neurologic:  Normal speech and language. No gross focal neurologic deficits are appreciated.  Skin:  Skin is warm, dry and intact. No rash noted. Psychiatric: Mood and affect are normal. Speech and behavior are normal.  ____________________________________________   LABS (all labs ordered are listed, but only abnormal results are displayed)  Labs Reviewed  CBC WITH DIFFERENTIAL/PLATELET  COMPREHENSIVE METABOLIC PANEL  ETHANOL  URINALYSIS, COMPLETE (UACMP) WITH MICROSCOPIC  URINE DRUG SCREEN, QUALITATIVE ([facility] ONLY)  ACETAMINOPHEN LEVEL  SALICYLATE LEVEL   ____________________________________________  EKG  I personally interpreted any EKGs ordered by me or triage  ____________________________________________  RADIOLOGY  I reviewed any imaging ordered by me or  triage that were performed during my shift and, if possible, patient and/or family made aware of any abnormal findings. ____________________________________________   PROCEDURES  Procedure(s) performed: None  Procedures  Critical Care performed: None  ____________________________________________   INITIAL IMPRESSION / ASSESSMENT AND PLAN / ED COURSE  Pertinent labs & imaging results that were available during my care of the patient were reviewed by me and considered in my medical decision making (see chart for details).  Very well-appearing gentleman because of history of HIV , we did do a chest x-ray which is reassuring. No evidence of pneumonia or tuberculosis. We are sending screening labs prior to disposition for EtOH/cocaine abuse.    ____________________________________________   FINAL CLINICAL IMPRESSION(S) / ED DIAGNOSES  Final diagnoses:  None      This chart was dictated using voice recognition software.  Despite  best efforts to proofread,  errors can occur which can change meaning.      Jeanmarie Plant, MD 03/09/17 1321    Jeanmarie Plant, MD 03/09/17 [phone]

## 2017-03-09 NOTE — ED Notes (Signed)
Patient transported to X-ray 

## 2017-03-09 NOTE — ED Notes (Signed)
Pt. Given taxi voucher to [employer] [facility] [place].

## 2017-03-09 NOTE — ED Notes (Signed)
[facility]. Does not want to go to [facility] because not sure they will have a bed and he states " I will just be on the streets doing drugs till Monday then".  Will see if can get a referral for pt.

## 2018-01-30 NOTE — Unmapped (Signed)
Social Work Intern contacted patient regarding depression treatment and remission follow-up. The person that responded to the call stated that he was not the patient and the number was incorrect. Caller will follow up.

## 2018-04-08 ENCOUNTER — Emergency Department: Payer: Medicaid Other

## 2018-04-08 ENCOUNTER — Inpatient Hospital Stay
Admission: EM | Admit: 2018-04-08 | Discharge: 2018-04-10 | DRG: 565 | Disposition: A | Discharge status: Home / Self Care | Payer: Medicaid Other | Attending: Specialist | Admitting: Specialist

## 2018-04-08 ENCOUNTER — Other Ambulatory Visit: Payer: Self-pay

## 2018-04-08 DIAGNOSIS — G629 Polyneuropathy, unspecified: Secondary | ICD-10-CM | POA: Diagnosis present

## 2018-04-08 DIAGNOSIS — F17210 Nicotine dependence, cigarettes, uncomplicated: Secondary | ICD-10-CM | POA: Diagnosis present

## 2018-04-08 DIAGNOSIS — W19XXXA Unspecified fall, initial encounter: Secondary | ICD-10-CM | POA: Diagnosis present

## 2018-04-08 DIAGNOSIS — Z8619 Personal history of other infectious and parasitic diseases: Secondary | ICD-10-CM | POA: Diagnosis not present

## 2018-04-08 DIAGNOSIS — X30XXXA Exposure to excessive natural heat, initial encounter: Secondary | ICD-10-CM | POA: Diagnosis not present

## 2018-04-08 DIAGNOSIS — Z79899 Other long term (current) drug therapy: Secondary | ICD-10-CM | POA: Diagnosis not present

## 2018-04-08 DIAGNOSIS — F319 Bipolar disorder, unspecified: Secondary | ICD-10-CM | POA: Diagnosis present

## 2018-04-08 DIAGNOSIS — N179 Acute kidney failure, unspecified: Secondary | ICD-10-CM | POA: Diagnosis present

## 2018-04-08 DIAGNOSIS — B20 Human immunodeficiency virus [HIV] disease: Secondary | ICD-10-CM | POA: Diagnosis present

## 2018-04-08 DIAGNOSIS — I10 Essential (primary) hypertension: Secondary | ICD-10-CM | POA: Diagnosis present

## 2018-04-08 DIAGNOSIS — N289 Disorder of kidney and ureter, unspecified: Secondary | ICD-10-CM | POA: Diagnosis present

## 2018-04-08 DIAGNOSIS — M6282 Rhabdomyolysis: Secondary | ICD-10-CM

## 2018-04-08 DIAGNOSIS — E739 Lactose intolerance, unspecified: Secondary | ICD-10-CM | POA: Diagnosis present

## 2018-04-08 DIAGNOSIS — R55 Syncope and collapse: Secondary | ICD-10-CM | POA: Diagnosis present

## 2018-04-08 DIAGNOSIS — M1990 Unspecified osteoarthritis, unspecified site: Secondary | ICD-10-CM | POA: Diagnosis present

## 2018-04-08 DIAGNOSIS — E785 Hyperlipidemia, unspecified: Secondary | ICD-10-CM | POA: Diagnosis present

## 2018-04-08 DIAGNOSIS — J45909 Unspecified asthma, uncomplicated: Secondary | ICD-10-CM | POA: Diagnosis present

## 2018-04-08 DIAGNOSIS — Z91048 Other nonmedicinal substance allergy status: Secondary | ICD-10-CM

## 2018-04-08 DIAGNOSIS — Z7982 Long term (current) use of aspirin: Secondary | ICD-10-CM | POA: Diagnosis not present

## 2018-04-08 DIAGNOSIS — Z791 Long term (current) use of non-steroidal anti-inflammatories (NSAID): Secondary | ICD-10-CM | POA: Diagnosis not present

## 2018-04-08 DIAGNOSIS — E860 Dehydration: Secondary | ICD-10-CM | POA: Diagnosis present

## 2018-04-08 DIAGNOSIS — R748 Abnormal levels of other serum enzymes: Secondary | ICD-10-CM | POA: Diagnosis present

## 2018-04-08 DIAGNOSIS — T796XXA Traumatic ischemia of muscle, initial encounter: Principal | ICD-10-CM | POA: Diagnosis present

## 2018-04-08 DIAGNOSIS — Y92096 Garden or yard of other non-institutional residence as the place of occurrence of the external cause: Secondary | ICD-10-CM

## 2018-04-08 DIAGNOSIS — R079 Chest pain, unspecified: Secondary | ICD-10-CM

## 2018-04-08 LAB — CBC
HEMATOCRIT: 44.4 % (ref 40.0–52.0)
Hemoglobin: 15.2 g/dL (ref 13.0–18.0)
MCH: 33.4 pg (ref 26.0–34.0)
MCHC: 34.2 g/dL (ref 32.0–36.0)
MCV: 97.9 fL (ref 80.0–100.0)
Platelets: 241 10*3/uL (ref 150–440)
RBC: 4.54 MIL/uL (ref 4.40–5.90)
RDW: 13.8 % (ref 11.5–14.5)
WBC: 6.3 10*3/uL (ref 3.8–10.6)

## 2018-04-08 LAB — TROPONIN I: Troponin I: <0.03 ng/mL (ref <0.03)

## 2018-04-08 LAB — BASIC METABOLIC PANEL
Anion gap: 7 (ref 5–15)
BUN: 36 mg/dL — AB (ref 6–20)
CO2: 24 mmol/L (ref 22–32)
CREATININE: 1.96 mg/dL — AB (ref 0.61–1.24)
Calcium: 9.6 mg/dL (ref 8.9–10.3)
Chloride: 105 mmol/L (ref 101–111)
GFR calc Af Amer: 43 mL/min — ABNORMAL LOW (ref >60)
GFR, EST NON AFRICAN AMERICAN: 37 mL/min — AB (ref >60)
GLUCOSE: 138 mg/dL — AB (ref 65–99)
POTASSIUM: 4 mmol/L (ref 3.5–5.1)
Sodium: 136 mmol/L (ref 135–145)

## 2018-04-08 LAB — CK: Total CK: 2194 U/L — ABNORMAL HIGH (ref 49–397)

## 2018-04-08 MED ORDER — ONDANSETRON HCL 4 MG/2ML IJ SOLN: 4.0000 mg | Freq: Four times a day (QID) | INTRAMUSCULAR | Status: DC | PRN

## 2018-04-08 MED ORDER — OXYMETAZOLINE HCL 0.05 % NA SOLN
1.0000 | Freq: Two times a day (BID) | NASAL | Status: DC
  Administered 2018-04-08 – 2018-04-09 (×2): 1 via NASAL
  Filled 2018-04-08 (×2): qty 15

## 2018-04-08 MED ORDER — BISACODYL 5 MG PO TBEC: 5.0000 mg | DELAYED_RELEASE_TABLET | Freq: Every day | ORAL | Status: DC | PRN

## 2018-04-08 MED ORDER — EMTRICITABINE-TENOFOVIR AF 200-25 MG PO TABS
1.0000 | ORAL_TABLET | Freq: Every day | ORAL | Status: DC
  Administered 2018-04-08 – 2018-04-09 (×2): 1 via ORAL
  Filled 2018-04-08 (×3): qty 1

## 2018-04-08 MED ORDER — ASPIRIN EC 81 MG PO TBEC
81.0000 mg | DELAYED_RELEASE_TABLET | Freq: Every day | ORAL | Status: DC
  Administered 2018-04-08 – 2018-04-09 (×2): 81 mg via ORAL
  Filled 2018-04-08 (×2): qty 1

## 2018-04-08 MED ORDER — GABAPENTIN 300 MG PO CAPS
300.0000 mg | ORAL_CAPSULE | Freq: Three times a day (TID) | ORAL | Status: DC
  Administered 2018-04-08 – 2018-04-09 (×4): 300 mg via ORAL
  Filled 2018-04-08 (×4): qty 1

## 2018-04-08 MED ORDER — ATORVASTATIN CALCIUM 20 MG PO TABS
40.0000 mg | ORAL_TABLET | Freq: Every day | ORAL | Status: DC
  Administered 2018-04-08 – 2018-04-09 (×2): 40 mg via ORAL
  Filled 2018-04-08: qty 4
  Filled 2018-04-08: qty 2

## 2018-04-08 MED ORDER — DOLUTEGRAVIR SODIUM 50 MG PO TABS
50.0000 mg | ORAL_TABLET | Freq: Every day | ORAL | Status: DC
  Administered 2018-04-08 – 2018-04-09 (×2): 50 mg via ORAL
  Filled 2018-04-08 (×3): qty 1

## 2018-04-08 MED ORDER — NICOTINE 14 MG/24HR TD PT24
14.0000 mg | MEDICATED_PATCH | Freq: Every day | TRANSDERMAL | Status: DC
  Administered 2018-04-08 – 2018-04-09 (×2): 14 mg via TRANSDERMAL
  Filled 2018-04-08 (×2): qty 1

## 2018-04-08 MED ORDER — ACETAMINOPHEN 650 MG RE SUPP: 650.0000 mg | Freq: Four times a day (QID) | RECTAL | Status: DC | PRN

## 2018-04-08 MED ORDER — ONDANSETRON HCL 4 MG PO TABS: 4.0000 mg | ORAL_TABLET | Freq: Four times a day (QID) | ORAL | Status: DC | PRN

## 2018-04-08 MED ORDER — SODIUM CHLORIDE 0.9 % IV SOLN
INTRAVENOUS | Status: DC
  Administered 2018-04-08 – 2018-04-10 (×4): via INTRAVENOUS

## 2018-04-08 MED ORDER — TRAZODONE HCL 50 MG PO TABS
150.0000 mg | ORAL_TABLET | Freq: Every day | ORAL | Status: DC
  Administered 2018-04-08 – 2018-04-09 (×2): 150 mg via ORAL
  Filled 2018-04-08 (×2): qty 1

## 2018-04-08 MED ORDER — SODIUM CHLORIDE 0.9 % IV BOLUS
1000.0000 mL | Freq: Once | INTRAVENOUS | Status: AC
  Administered 2018-04-08: 1000 mL via INTRAVENOUS

## 2018-04-08 MED ORDER — ERGOCALCIFEROL 1.25 MG (50000 UT) PO CAPS
50000.0000 [IU] | ORAL_CAPSULE | ORAL | Status: DC
  Administered 2018-04-08: 50000 [IU] via ORAL
  Filled 2018-04-08: qty 1

## 2018-04-08 MED ORDER — ALBUTEROL SULFATE (2.5 MG/3ML) 0.083% IN NEBU: 2.5000 mg | INHALATION_SOLUTION | RESPIRATORY_TRACT | Status: DC | PRN

## 2018-04-08 MED ORDER — SENNOSIDES-DOCUSATE SODIUM 8.6-50 MG PO TABS: 1.0000 | ORAL_TABLET | Freq: Every evening | ORAL | Status: DC | PRN

## 2018-04-08 MED ORDER — POLYETHYLENE GLYCOL 3350 17 G PO PACK
17.0000 g | PACK | Freq: Every day | ORAL | Status: DC
  Administered 2018-04-09: 17 g via ORAL
  Filled 2018-04-08: qty 1

## 2018-04-08 MED ORDER — DOCUSATE SODIUM 100 MG PO CAPS
100.0000 mg | ORAL_CAPSULE | Freq: Two times a day (BID) | ORAL | Status: DC
  Administered 2018-04-09 (×2): 100 mg via ORAL
  Filled 2018-04-08 (×2): qty 1

## 2018-04-08 MED ORDER — CITALOPRAM HYDROBROMIDE 20 MG PO TABS
20.0000 mg | ORAL_TABLET | Freq: Every day | ORAL | Status: DC
  Administered 2018-04-08 – 2018-04-09 (×2): 20 mg via ORAL
  Filled 2018-04-08 (×2): qty 1

## 2018-04-08 MED ORDER — ACETAMINOPHEN 325 MG PO TABS: 650.0000 mg | ORAL_TABLET | Freq: Four times a day (QID) | ORAL | Status: DC | PRN

## 2018-04-08 MED ORDER — HEPARIN SODIUM (PORCINE) 5000 UNIT/ML IJ SOLN
5000.0000 [IU] | Freq: Three times a day (TID) | INTRAMUSCULAR | Status: DC
  Administered 2018-04-08 – 2018-04-10 (×5): 5000 [IU] via SUBCUTANEOUS
  Filled 2018-04-08 (×5): qty 1

## 2018-04-08 MED ORDER — HYDROCODONE-ACETAMINOPHEN 5-325 MG PO TABS
1.0000 | ORAL_TABLET | ORAL | Status: DC | PRN
  Administered 2018-04-09 (×2): 1 via ORAL
  Administered 2018-04-10: 2 via ORAL
  Filled 2018-04-08: qty 1
  Filled 2018-04-08: qty 2
  Filled 2018-04-08: qty 1

## 2018-04-08 NOTE — ED Notes (Signed)
Pt back in room from XR 

## 2018-04-08 NOTE — ED Triage Notes (Signed)
Says was out mowing lawn and he started feeling dizzy and chest started hurting

## 2018-04-08 NOTE — ED Notes (Signed)
Patient transported to XR. 

## 2018-04-08 NOTE — ED Triage Notes (Signed)
Both legs and feet aching since the chest pain started this am.  Has not been taking htn med as is out.  [facility] appt tomorrow at [facility].

## 2018-04-08 NOTE — ED Provider Notes (Signed)
[facility] Emergency Department Provider Note  ___________________________________________   First MD Initiated Contact with Patient 04/08/18 1849     (approximate)  I have reviewed the triage vital signs and the nursing notes.   HISTORY  Chief Complaint Chest Pain   HPI Roy Buccheri Sr. is a 52 y.o. male with a history of HIV and hypertension was presenting to the emergency department today with syncope and chest pain.  He says that he was pushing a lawnmower today when he began to feel a squeezing pain under his left pectoralis major muscle.  He says that he then felt lightheaded and thinks that he passed out because he woke up, confused, on the ground.  He says ever since then he has been having aching pains to his right lower extremity.  Denying any chest pain at this time.  York Spaniel that he has had a similar episode in the past and was told that he had a very slow heart rate and was considered for pacemaker.  Denies any shortness of breath.  Denies any nausea or vomiting.  Says that he does not drink water and thinks he may also be dehydrated.  Says that he has been off his HIV medications for about the past 2 to 3 weeks.   Past Medical History:  Diagnosis Date  . AIDS (acquired immune deficiency syndrome) (HCC)   . Arthritis   . Asthma   . Bipolar disorder (HCC)   . Bronchitis   . Depression   . GERD (gastroesophageal reflux disease)   . Hepatitis C   . HIV (human immunodeficiency virus infection) (HCC)   . HTN (hypertension)     Patient Active Problem List   Diagnosis Date Noted  . ARF (acute renal failure) (HCC) 04/08/2018  . Cocaine use disorder, moderate, dependence (HCC) 02/19/2017  . Substance induced mood disorder (HCC) 02/19/2017  . Tobacco use disorder 10/26/2016  . HIV disease (HCC) 10/26/2016  . HTN (hypertension) 10/26/2016  . Dyslipidemia 10/26/2016  . BPH (benign prostatic hyperplasia) 10/26/2016  . Constipation 10/26/2016    . Severe recurrent major depression without psychotic features (HCC) 10/25/2016    Past Surgical History:  Procedure Laterality Date  . HERNIA REPAIR    . TOE SURGERY      Prior to Admission medications   Medication Sig Start Date End Date Taking? Authorizing Provider  amLODipine (NORVASC) 5 MG tablet Take 1 tablet (5 mg total) by mouth daily. 02/26/17  Yes Pucilowska, Jolanta B, MD  aspirin EC 81 MG tablet Take 1 tablet (81 mg total) by mouth daily. 02/26/17  Yes Pucilowska, Jolanta B, MD  atorvastatin (LIPITOR) 40 MG tablet Take 1 tablet (40 mg total) by mouth daily at 6 PM. 02/26/17  Yes Pucilowska, Jolanta B, MD  citalopram (CELEXA) 20 MG tablet Take 1 tablet (20 mg total) by mouth daily. 02/26/17  Yes Pucilowska, Jolanta B, MD  docusate sodium (COLACE) 100 MG capsule Take 1 capsule (100 mg total) by mouth 2 (two) times daily. 02/26/17  Yes Pucilowska, Jolanta B, MD  dolutegravir (TIVICAY) 50 MG tablet Take 1 tablet (50 mg total) by mouth daily. 02/26/17  Yes Pucilowska, Jolanta B, MD  emtricitabine-tenofovir AF (DESCOVY) 200-25 MG tablet Take 1 tablet by mouth daily. 02/26/17  Yes Pucilowska, Jolanta B, MD  ergocalciferol (VITAMIN D2) 50000 units capsule Take 1 capsule (50,000 Units total) by mouth once a week. 02/26/17  Yes Pucilowska, Jolanta B, MD  gabapentin (NEURONTIN) 300 MG capsule Take 1  capsule (300 mg total) by mouth 3 (three) times daily. 02/26/17  Yes Pucilowska, Jolanta B, MD  lisinopril (PRINIVIL,ZESTRIL) 20 MG tablet Take 1 tablet (20 mg total) by mouth daily. 02/26/17  Yes Pucilowska, Jolanta B, MD  meloxicam (MOBIC) 7.5 MG tablet Take 1 tablet (7.5 mg total) by mouth 2 (two) times daily. 02/26/17  Yes Pucilowska, Jolanta B, MD  oxymetazoline (AFRIN) 0.05 % nasal spray Place 1 spray into both nostrils 2 (two) times daily. 02/26/17  Yes Pucilowska, Jolanta B, MD  polyethylene glycol (MIRALAX / GLYCOLAX) packet Take 17 g by mouth daily. 02/26/17  Yes Pucilowska, Jolanta B, MD   traZODone (DESYREL) 150 MG tablet Take 1 tablet (150 mg total) by mouth at bedtime. 02/26/17  Yes Pucilowska, Jolanta B, MD  acetaminophen (TYLENOL) 500 MG tablet Take 1 tablet (500 mg total) by mouth every 8 (eight) hours as needed. 02/26/17   Pucilowska, Jolanta B, MD  albuterol (PROVENTIL HFA;VENTOLIN HFA) 108 (90 Base) MCG/ACT inhaler Inhale 2 puffs into the lungs every 4 (four) hours as needed for shortness of breath. 02/26/17   Pucilowska, Braulio Conte B, MD  tamsulosin (FLOMAX) 0.4 MG CAPS capsule Take 1 capsule (0.4 mg total) by mouth daily after breakfast. 02/26/17   Pucilowska, Jolanta B, MD    Allergies Lactose and Pollen extract  Family History  Problem Relation Age of Onset  . Cancer Brother     Social History Social History   Tobacco Use  . Smoking status: Current Every Day Smoker    Types: Cigarettes  . Smokeless tobacco: Current User  Substance Use Topics  . Alcohol use: Yes    Alcohol/week: 2.4 oz    Types: 4 Cans of beer per week  . Drug use: Yes    Types: Cocaine    Review of Systems  Constitutional: No fever/chills Eyes: No visual changes. ENT: No sore throat. Cardiovascular: As above Respiratory: Denies shortness of breath. Gastrointestinal: No abdominal pain.  No nausea, no vomiting.  No diarrhea.  No constipation. Genitourinary: Negative for dysuria. Musculoskeletal: Negative for back pain. Skin: Negative for rash. Neurological: Negative for headaches, focal weakness or numbness.   ____________________________________________   PHYSICAL EXAM:  VITAL SIGNS: ED Triage Vitals  Enc Vitals Group     BP 04/08/18 1714 111/66     Pulse Rate 04/08/18 1714 76     Resp 04/08/18 1714 16     Temp --      Temp src --      SpO2 04/08/18 1714 97 %     Weight --      Height --      Head Circumference --      Peak Flow --      Pain Score 04/08/18 1335 9     Pain Loc --      Pain Edu? --      Excl. in GC? --     Constitutional: Alert and oriented. Well  appearing and in no acute distress. Eyes: Conjunctivae are normal.  Head: Atraumatic. Nose: No congestion/rhinnorhea. Mouth/Throat: Mucous membranes are moist.  Neck: No stridor.   Cardiovascular: Normal rate, regular rhythm. Grossly normal heart sounds.  Equal and bilateral radial as well as dorsalis pedis pulses. Respiratory: Normal respiratory effort.  No retractions. Lungs CTAB. Gastrointestinal: Soft and nontender. No distention.  Musculoskeletal: No lower extremity tenderness nor edema.  No joint effusions.  Patient says that it hurts his right femur when he lifts his right leg.  Compartments are soft.  No  deformity or evidence of trauma.   Neurologic:  Normal speech and language. No gross focal neurologic deficits are appreciated. Skin:  Skin is warm, dry and intact. No rash noted. Psychiatric: Mood and affect are normal. Speech and behavior are normal.  ____________________________________________   LABS (all labs ordered are listed, but only abnormal results are displayed)  Labs Reviewed  BASIC METABOLIC PANEL - Abnormal; Notable for the following components:      Result Value   Glucose, Bld 138 (*)    BUN 36 (*)    Creatinine, Ser 1.96 (*)    GFR calc non Af Amer 37 (*)    GFR calc Af Amer 43 (*)    All other components within normal limits  CK - Abnormal; Notable for the following components:   Total CK 2,194 (*)    All other components within normal limits  CBC  TROPONIN I  TROPONIN I   ____________________________________________  EKG  ED ECG REPORT I, Arelia Longest, the attending physician, personally viewed and interpreted this ECG.   Date: 04/08/2018  EKG Time: 1333  Rate: 74  Rhythm: normal sinus rhythm  Axis: Normal  Intervals:none  ST&T Change: No ST segment elevation or depression.  No abnormal T wave inversion.  ____________________________________________  RADIOLOGY  No acute finding on the femur x-ray.  No acute finding on the chest  x-ray. ____________________________________________   PROCEDURES  Procedure(s) performed:   Procedures  Critical Care performed:   ____________________________________________   INITIAL IMPRESSION / ASSESSMENT AND PLAN / ED COURSE  Pertinent labs & imaging results that were available during my care of the patient were reviewed by me and considered in my medical decision making (see chart for details).  DDX: Arrhythmia, aortic dissection, syncope, rhabdomyolysis, dehydration, acute kidney failure, MI, PE As part of my medical decision making, I reviewed the following data within the electronic MEDICAL RECORD NUMBER Notes from prior ED visits  ----------------------------------------- 7:45 PM on 04/08/2018 -----------------------------------------  Patient found to be in mild acute renal failure as well as rhabdomyolysis.  I believe the patient will require telemetry monitoring as well as IV fluids.  Says that when he has syncopized in the past he is also had leg cramping.  Possibly related to the rhabdomyolysis.  Patient is understanding of the diagnosis as well as treatment plan willing to comply.  Will be admitted to the hospital.  Signed out to Dr. Imogene Burn. ____________________________________________   FINAL CLINICAL IMPRESSION(S) / ED DIAGNOSES  Renal insufficiency.  Rhabdomyolysis.  Syncope.  Chest pain.    NEW MEDICATIONS STARTED DURING THIS VISIT:  New Prescriptions   No medications on file     Note:  This document was prepared using Dragon voice recognition software and may include unintentional dictation errors.     Myrna Blazer, MD 04/08/18 1946

## 2018-04-08 NOTE — ED Notes (Signed)
Pt is resting comfortably at this time. States that he was mowing his lawn mower earlier and feels as though he blacked out. States "I'm not sure if I passed out; I probably did." Reports bilateral aching in lower extremities. Says he drinks water "almost never."

## 2018-04-08 NOTE — H&P (Addendum)
[facility] [place] at [facility]   PATIENT NAME: Roy [name]    MR#:  [id number]  DATE OF BIRTH:  1966/07/31  DATE OF ADMISSION:  04/08/2018  PRIMARY CARE PHYSICIAN: System, Provider Not In   REQUESTING/REFERRING PHYSICIAN: Myrna Blazer, MD  CHIEF COMPLAINT:   Chief Complaint  Patient presents with  . Chest Pain   Dizziness, weakness and syncope today. HISTORY OF PRESENT ILLNESS:  Roy [name]  is a 52 y.o. male with a known history of multiple medical problems as below.  The patient feels dizzy, weakness and almost pass out when he was doing yard work today.  He fell on the ground hit leg and chest.  He complains of right same muscle aching and chest pain.  He was found renal failure and elevated CK. PAST MEDICAL HISTORY:   Past Medical History:  Diagnosis Date  . AIDS (acquired immune deficiency syndrome) (HCC)   . Arthritis   . Asthma   . Bipolar disorder (HCC)   . Bronchitis   . Depression   . GERD (gastroesophageal reflux disease)   . Hepatitis C   . HIV (human immunodeficiency virus infection) (HCC)   . HTN (hypertension)     PAST SURGICAL HISTORY:   Past Surgical History:  Procedure Laterality Date  . HERNIA REPAIR    . TOE SURGERY      SOCIAL HISTORY:   Social History   Tobacco Use  . Smoking status: Current Every Day Smoker    Types: Cigarettes  . Smokeless tobacco: Current User  Substance Use Topics  . Alcohol use: Yes    Alcohol/week: 2.4 oz    Types: 4 Cans of beer per week    FAMILY HISTORY:   Family History  Problem Relation Age of Onset  . Cancer Brother     DRUG ALLERGIES:   Allergies  Allergen Reactions  . Lactose Other (See Comments)    GI distress  . Pollen Extract Itching    REVIEW OF SYSTEMS:   Review of Systems  Constitutional: Positive for malaise/fatigue. Negative for chills and fever.  HENT: Negative for sore throat.   Eyes: Negative for blurred vision and double vision.    Respiratory: Negative for cough, hemoptysis, shortness of breath, wheezing and stridor.   Cardiovascular: Positive for chest pain. Negative for palpitations, orthopnea and leg swelling.  Gastrointestinal: Negative for abdominal pain, blood in stool, diarrhea, melena, nausea and vomiting.  Genitourinary: Negative for dysuria, flank pain and hematuria.  Musculoskeletal: Negative for back pain and joint pain.  Skin: Negative for rash.  Neurological: Positive for dizziness. Negative for sensory change, focal weakness, seizures, loss of consciousness, weakness and headaches.  Endo/Heme/Allergies: Negative for polydipsia.  Psychiatric/Behavioral: Negative for depression. The patient is not nervous/anxious.     MEDICATIONS AT HOME:   Prior to Admission medications   Medication Sig Start Date End Date Taking? Authorizing Provider  amLODipine (NORVASC) 5 MG tablet Take 1 tablet (5 mg total) by mouth daily. 02/26/17  Yes Pucilowska, Jolanta B, MD  aspirin EC 81 MG tablet Take 1 tablet (81 mg total) by mouth daily. 02/26/17  Yes Pucilowska, Jolanta B, MD  atorvastatin (LIPITOR) 40 MG tablet Take 1 tablet (40 mg total) by mouth daily at 6 PM. 02/26/17  Yes Pucilowska, Jolanta B, MD  citalopram (CELEXA) 20 MG tablet Take 1 tablet (20 mg total) by mouth daily. 02/26/17  Yes Pucilowska, Jolanta B, MD  docusate sodium (COLACE) 100 MG capsule Take 1 capsule (  100 mg total) by mouth 2 (two) times daily. 02/26/17  Yes Pucilowska, Jolanta B, MD  dolutegravir (TIVICAY) 50 MG tablet Take 1 tablet (50 mg total) by mouth daily. 02/26/17  Yes Pucilowska, Jolanta B, MD  emtricitabine-tenofovir AF (DESCOVY) 200-25 MG tablet Take 1 tablet by mouth daily. 02/26/17  Yes Pucilowska, Jolanta B, MD  ergocalciferol (VITAMIN D2) 50000 units capsule Take 1 capsule (50,000 Units total) by mouth once a week. 02/26/17  Yes Pucilowska, Jolanta B, MD  gabapentin (NEURONTIN) 300 MG capsule Take 1 capsule (300 mg total) by mouth 3 (three)  times daily. 02/26/17  Yes Pucilowska, Jolanta B, MD  lisinopril (PRINIVIL,ZESTRIL) 20 MG tablet Take 1 tablet (20 mg total) by mouth daily. 02/26/17  Yes Pucilowska, Jolanta B, MD  meloxicam (MOBIC) 7.5 MG tablet Take 1 tablet (7.5 mg total) by mouth 2 (two) times daily. 02/26/17  Yes Pucilowska, Jolanta B, MD  oxymetazoline (AFRIN) 0.05 % nasal spray Place 1 spray into both nostrils 2 (two) times daily. 02/26/17  Yes Pucilowska, Jolanta B, MD  polyethylene glycol (MIRALAX / GLYCOLAX) packet Take 17 g by mouth daily. 02/26/17  Yes Pucilowska, Jolanta B, MD  traZODone (DESYREL) 150 MG tablet Take 1 tablet (150 mg total) by mouth at bedtime. 02/26/17  Yes Pucilowska, Jolanta B, MD  acetaminophen (TYLENOL) 500 MG tablet Take 1 tablet (500 mg total) by mouth every 8 (eight) hours as needed. 02/26/17   Pucilowska, Jolanta B, MD  albuterol (PROVENTIL HFA;VENTOLIN HFA) 108 (90 Base) MCG/ACT inhaler Inhale 2 puffs into the lungs every 4 (four) hours as needed for shortness of breath. 02/26/17   Pucilowska, Braulio Conte B, MD  tamsulosin (FLOMAX) 0.4 MG CAPS capsule Take 1 capsule (0.4 mg total) by mouth daily after breakfast. 02/26/17   Pucilowska, Jolanta B, MD      VITAL SIGNS:  Blood pressure 119/68, pulse (!) 56, resp. rate 13, SpO2 100 %.  PHYSICAL EXAMINATION:  Physical Exam  GENERAL:  52 y.o.-year-old patient lying in the bed with no acute distress.  EYES: Pupils equal, round, reactive to light and accommodation. No scleral icterus. Extraocular muscles intact.  HEENT: Head atraumatic, normocephalic. Oropharynx and nasopharynx clear.  NECK:  Supple, no jugular venous distention. No thyroid enlargement, no tenderness.  LUNGS: Normal breath sounds bilaterally, no wheezing, rales,rhonchi or crepitation. No use of accessory muscles of respiration.  Chest tenderness on palpation. CARDIOVASCULAR: S1, S2 normal. No murmurs, rubs, or gallops.  ABDOMEN: Soft, nontender, nondistended. Bowel sounds present. No  organomegaly or mass.  EXTREMITIES: No pedal edema, cyanosis, or clubbing.  NEUROLOGIC: Cranial nerves II through XII are intact. Muscle strength 5/5 in all extremities. Sensation intact. Gait not checked.  PSYCHIATRIC: The patient is alert and oriented x 3.  SKIN: No obvious rash, lesion, or ulcer.   LABORATORY PANEL:   CBC Recent Labs  Lab 04/08/18 1344  WBC 6.3  HGB 15.2  HCT 44.4  PLT 241   ------------------------------------------------------------------------------------------------------------------  Chemistries  Recent Labs  Lab 04/08/18 1344  NA 136  K 4.0  CL 105  CO2 24  GLUCOSE 138*  BUN 36*  CREATININE 1.96*  CALCIUM 9.6   ------------------------------------------------------------------------------------------------------------------  Cardiac Enzymes Recent Labs  Lab 04/08/18 1802  TROPONINI <0.03   ------------------------------------------------------------------------------------------------------------------  RADIOLOGY:  Dg Chest 2 View  Result Date: 04/08/2018 CLINICAL DATA:  Chest pain EXAM: CHEST - 2 VIEW COMPARISON:  03/09/2017 FINDINGS: The heart size and mediastinal contours are within normal limits. Both lungs are clear. Mild degenerative changes of the  spine. IMPRESSION: No active cardiopulmonary disease. Electronically Signed   By: Jasmine Pang M.D.   On: 04/08/2018 13:59   Dg Femur Min 2 Views Right  Result Date: 04/08/2018 CLINICAL DATA:  Recent fall with right leg pain, initial encounter EXAM: RIGHT FEMUR 2 VIEWS COMPARISON:  02/17/2017 FINDINGS: Degenerative changes in the right hip joint are noted with loss of the superior joint space and mild remodeling of the femoral head. No acute fracture or dislocation is noted. No joint effusion is seen. No soft tissue changes are noted. IMPRESSION: Chronic degenerative change without acute abnormality Electronically Signed   By: Alcide Clever M.D.   On: 04/08/2018 18:38      IMPRESSION AND  PLAN:   Acute renal failure. The patient will be admitted to medical floor. Hold meloxicam and lisinopril, start normal saline IV and follow-up BMP.  Rhabdomyolysis.  IV fluid support and follow-up CK.  Syncope, due to dehydration and possible orthostatic hypotension. Orthostatic vital signs.  Normal saline IV.  AIDS.  Continue home medication.  Hypertension.  Hold hypertension medication due to low side blood pressure. Tobacco abuse.  Smoking cessation was counseled for 4 minutes, nicotine patch. All the records are reviewed and case discussed with ED provider. Management plans discussed with the patient, family and they are in agreement.  CODE STATUS:   TOTAL TIME TAKING CARE OF THIS PATIENT: 55 minutes.    Shaune Pollack M.D on 04/08/2018 at 8:04 PM  Between 7am to 6pm - Pager - [phone]  After 6pm go to [email or web address] - Social research officer, government  [facility]  Office  [phone]  CC: Primary care physician; System, Provider Not In   Note: This dictation was prepared with Dragon dictation along with smaller phrase technology. Any transcriptional errors that result from this process are unin

## 2018-04-09 ENCOUNTER — Inpatient Hospital Stay: Payer: Medicaid Other

## 2018-04-09 LAB — CK
CK TOTAL: 1006 U/L — AB (ref 49–397)
CK TOTAL: 813 U/L — AB (ref 49–397)

## 2018-04-09 LAB — LIPID PANEL
CHOL/HDL RATIO: 3.5 ratio
Cholesterol: 130 mg/dL (ref 0–200)
HDL: 37 mg/dL — AB (ref >40)
LDL Cholesterol: 74 mg/dL (ref 0–99)
Triglycerides: 94 mg/dL (ref <150)
VLDL: 19 mg/dL (ref 0–40)

## 2018-04-09 LAB — BASIC METABOLIC PANEL
ANION GAP: 4 — AB (ref 5–15)
Anion gap: 5 (ref 5–15)
BUN: 27 mg/dL — ABNORMAL HIGH (ref 6–20)
BUN: 23 mg/dL — AB (ref 6–20)
CHLORIDE: 109 mmol/L (ref 101–111)
CO2: 22 mmol/L (ref 22–32)
CO2: 24 mmol/L (ref 22–32)
Calcium: 8 mg/dL — ABNORMAL LOW (ref 8.9–10.3)
Calcium: 8 mg/dL — ABNORMAL LOW (ref 8.9–10.3)
Chloride: 106 mmol/L (ref 101–111)
Creatinine, Ser: 1.25 mg/dL — ABNORMAL HIGH (ref 0.61–1.24)
Creatinine, Ser: 1.26 mg/dL — ABNORMAL HIGH (ref 0.61–1.24)
GFR calc Af Amer: >60 mL/min (ref >60)
GLUCOSE: 97 mg/dL (ref 65–99)
Glucose, Bld: 119 mg/dL — ABNORMAL HIGH (ref 65–99)
POTASSIUM: 3.6 mmol/L (ref 3.5–5.1)
POTASSIUM: 4.2 mmol/L (ref 3.5–5.1)
SODIUM: 135 mmol/L (ref 135–145)
Sodium: 135 mmol/L (ref 135–145)

## 2018-04-09 MED ORDER — AMLODIPINE BESYLATE 5 MG PO TABS: 5.0000 mg | ORAL_TABLET | Freq: Every day | ORAL | 1 refills | Status: DC

## 2018-04-09 MED ORDER — GABAPENTIN 300 MG PO CAPS: 300.0000 mg | ORAL_CAPSULE | Freq: Three times a day (TID) | ORAL | 1 refills | Status: DC

## 2018-04-09 MED ORDER — TRAZODONE HCL 150 MG PO TABS: 150.0000 mg | ORAL_TABLET | Freq: Every day | ORAL | 1 refills | Status: DC

## 2018-04-09 MED ORDER — DOCUSATE SODIUM 100 MG PO CAPS: 100.0000 mg | ORAL_CAPSULE | Freq: Two times a day (BID) | ORAL | 1 refills | Status: DC

## 2018-04-09 MED ORDER — CITALOPRAM HYDROBROMIDE 20 MG PO TABS: 20.0000 mg | ORAL_TABLET | Freq: Every day | ORAL | 1 refills | Status: DC

## 2018-04-09 MED ORDER — ATORVASTATIN CALCIUM 40 MG PO TABS: 40.0000 mg | ORAL_TABLET | Freq: Every day | ORAL | 1 refills | Status: DC

## 2018-04-09 MED ORDER — POLYETHYLENE GLYCOL 3350 17 G PO PACK: 17.0000 g | PACK | Freq: Every day | ORAL | 1 refills | Status: DC

## 2018-04-09 MED ORDER — LISINOPRIL 20 MG PO TABS: 20.0000 mg | ORAL_TABLET | Freq: Every day | ORAL | 1 refills | Status: DC

## 2018-04-09 MED ORDER — ERGOCALCIFEROL 1.25 MG (50000 UT) PO CAPS: 50000.0000 [IU] | ORAL_CAPSULE | ORAL | 1 refills | Status: DC

## 2018-04-09 MED ORDER — MELOXICAM 7.5 MG PO TABS: 7.5000 mg | ORAL_TABLET | Freq: Two times a day (BID) | ORAL | 1 refills | Status: DC

## 2018-04-09 MED ORDER — ALBUTEROL SULFATE HFA 108 (90 BASE) MCG/ACT IN AERS: 2.0000 | INHALATION_SPRAY | RESPIRATORY_TRACT | 1 refills | Status: DC | PRN

## 2018-04-09 NOTE — Progress Notes (Signed)
Per Dr. Cherlynn Kaiser okay to cancel discharge as pt is to have a ride in the AM to help look for housing.

## 2018-04-09 NOTE — Care Management (Signed)
Patient to discharge home today.  Patient states that he is followed up [facility] for his antiviral medication.  Patient states he was recently released from prison, and they did not provide him with a supply of medication or rx at his release date.  Patient states that he has previously had Medicaid, and is in the process of getting it reinstated.  Patient states that he is follow by a Case worker from [facility] named Laurette Schimke who will help him obtain his anti viral medication after discharge.  All other discharge scripts have been sent to [facility].  Patient will be able to obtain all scripts (except colace and miralax) at no charge.  Patient to pick up after discharge.  Patient provided with application to [facility]  And [facility].  RNCM signing off.

## 2018-04-09 NOTE — Clinical Social Work Note (Addendum)
CSW was informed by bedside nurse that [facility] which is where patient has been staying has called and at first said he could return back to [employer], then someone called back later and said he was not able to return back.  CSW spoke to patient, he has all of his belongings at [employer] and was planning on returning.  CSW attempted to speak with [employer], CSW left a message awaiting for call back from [employer].  Patient was released from prison on April 4, and he does not have anywhere else to stay so he went to [facility].  3:45pm CSW received phone call back from [employer] of [employer], and he informed CSW that patient was discharged from [employer] on Sunday, because he broke the rules, by sneaking out after dark.  Patient was informed that he was discharged, and patient stated he did not know that he was discharged.  Patient stated that he was working on a trailer which was his mom's with his son on Monday, and he is trying to fix it up so he can live in it.  Patient stated he can not go to his son's house because he does not have space, and then he can not go to his fiancee's because she is in section 8 housing.  Patient contacted his case manager in the community who can pick him up tomorrow and help with finding a boarding house.  Roy [name]. Maricela Schreur, MSW, [facility] Majors [phone]  04/09/2018 3:21 PM

## 2018-04-09 NOTE — Progress Notes (Signed)
[facility] [place] at [facility]   PATIENT NAME: Roy [name]    MR#:  [id number]  DATE OF BIRTH:  09-18-1966  SUBJECTIVE:   She admitted to the hospital due to weakness, dizziness and noted to have acute rhabdomyolysis with acute kidney injury.  CKs and renal function have significantly improved since yesterday.  Patient still feels somewhat weak.  REVIEW OF SYSTEMS:    Review of Systems  Constitutional: Negative for chills and fever.  HENT: Negative for congestion and tinnitus.   Eyes: Negative for blurred vision and double vision.  Respiratory: Negative for cough, shortness of breath and wheezing.   Cardiovascular: Negative for chest pain, orthopnea and PND.  Gastrointestinal: Negative for abdominal pain, diarrhea, nausea and vomiting.  Genitourinary: Negative for dysuria and hematuria.  Neurological: Negative for dizziness, sensory change and focal weakness.  All other systems reviewed and are negative.   Nutrition: heart healthy Tolerating Diet: Yes Tolerating PT: Ambulatory   DRUG ALLERGIES:   Allergies  Allergen Reactions  . Lactose Other (See Comments)    GI distress  . Pollen Extract Itching    VITALS:  Blood pressure 101/69, pulse (!) 54, temperature 99.3 F (37.4 C), temperature source Oral, resp. rate 18, height 5\' 9"  (1.753 m), weight 102.5 kg (225 lb 15.5 oz), SpO2 97 %.  PHYSICAL EXAMINATION:   Physical Exam  GENERAL:  52 y.o.-year-old patient lying in bed in no acute distress.  EYES: Pupils equal, round, reactive to light and accommodation. No scleral icterus. Extraocular muscles intact.  HEENT: Head atraumatic, normocephalic. Oropharynx and nasopharynx clear.  NECK:  Supple, no jugular venous distention. No thyroid enlargement, no tenderness.  LUNGS: Normal breath sounds bilaterally, no wheezing, rales, rhonchi. No use of accessory muscles of respiration.  CARDIOVASCULAR: S1, S2 normal. No murmurs, rubs, or gallops.  ABDOMEN:  Soft, nontender, nondistended. Bowel sounds present. No organomegaly or mass.  EXTREMITIES: No cyanosis, clubbing or edema b/l.    NEUROLOGIC: Cranial nerves II through XII are intact. No focal Motor or sensory deficits b/l.   PSYCHIATRIC: The patient is alert and oriented x 3.  SKIN: No obvious rash, lesion, or ulcer.    LABORATORY PANEL:   CBC Recent Labs  Lab 04/08/18 1344  WBC 6.3  HGB 15.2  HCT 44.4  PLT 241   ------------------------------------------------------------------------------------------------------------------  Chemistries  Recent Labs  Lab 04/09/18 1313  NA 135  K 4.2  CL 106  CO2 24  GLUCOSE 97  BUN 23*  CREATININE 1.26*  CALCIUM 8.0*   ------------------------------------------------------------------------------------------------------------------  Cardiac Enzymes Recent Labs  Lab 04/08/18 1802  TROPONINI <0.03   ------------------------------------------------------------------------------------------------------------------  RADIOLOGY:  Dg Chest 2 View  Result Date: 04/08/2018 CLINICAL DATA:  Chest pain EXAM: CHEST - 2 VIEW COMPARISON:  03/09/2017 FINDINGS: The heart size and mediastinal contours are within normal limits. Both lungs are clear. Mild degenerative changes of the spine. IMPRESSION: No active cardiopulmonary disease. Electronically Signed   By: Jasmine Pang M.D.   On: 04/08/2018 13:59   US Carotid Bilateral  Result Date: 04/09/2018 CLINICAL DATA:  52 year old male with a history of syncope. Cardiovascular risk factors include hypertension, stroke/TIA, hyperlipidemia, tobacco use EXAM: BILATERAL CAROTID DUPLEX ULTRASOUND TECHNIQUE: Wallace Cullens scale imaging, color Doppler and duplex ultrasound were performed of bilateral carotid and vertebral arteries in the neck. COMPARISON:  None. FINDINGS: Criteria: Quantification of carotid stenosis is based on velocity parameters that correlate the residual internal carotid diameter with NASCET-based  stenosis levels, using the diameter of the distal  internal carotid lumen as the denominator for stenosis measurement. The following velocity measurements were obtained: RIGHT ICA:  Systolic 83 cm/sec, Diastolic 22 cm/sec CCA:  99 cm/sec SYSTOLIC ICA/CCA RATIO:  0.8 ECA:  124 cm/sec LEFT ICA:  Systolic 83 cm/sec, Diastolic 19 cm/sec CCA:  98 cm/sec SYSTOLIC ICA/CCA RATIO:  0.9 ECA:  128 cm/sec Right Brachial SBP: Not acquired Left Brachial SBP: Not acquired RIGHT CAROTID ARTERY: No significant calcifications of the right common carotid artery. Intermediate waveform maintained. Heterogeneous and partially calcified plaque at the right carotid bifurcation. No significant lumen shadowing. Low resistance waveform of the right ICA. No significant tortuosity. RIGHT VERTEBRAL ARTERY: Antegrade flow with low resistance waveform. LEFT CAROTID ARTERY: No significant calcifications of the left common carotid artery. Intermediate waveform maintained. Heterogeneous and partially calcified plaque at the left carotid bifurcation without significant lumen shadowing. Low resistance waveform of the left ICA. No significant tortuosity. LEFT VERTEBRAL ARTERY:  Antegrade flow with low resistance waveform. IMPRESSION: Color duplex indicates minimal heterogeneous and calcified plaque, with no hemodynamically significant stenosis by duplex criteria in the extracranial cerebrovascular circulation. Signed, Yvone Neu. Loreta Ave, DO [facility] Radiology Electronically Signed   By: Gilmer Mor D.O.   On: 04/09/2018 11:29   Dg Femur Min 2 Views Right  Result Date: 04/08/2018 CLINICAL DATA:  Recent fall with right leg pain, initial encounter EXAM: RIGHT FEMUR 2 VIEWS COMPARISON:  02/17/2017 FINDINGS: Degenerative changes in the right hip joint are noted with loss of the superior joint space and mild remodeling of the femoral head. No acute fracture or dislocation is noted. No joint effusion is  seen. No soft tissue changes are noted. IMPRESSION: Chronic degenerative change without acute abnormality Electronically Signed   By: Alcide Clever M.D.   On: 04/08/2018 18:38     ASSESSMENT AND PLAN:   52 yo male with PMH of HIV, GERD, hepatitis C, asthma, bipolar disorder who presented to the hospital due to weakness, dizziness and presyncope and noted to be in acute kidney injury with rhabdomyolysis.  1.  Acute rhabdomyolysis-secondary to patient working outside and being dehydrated. -Much improved with IV fluid hydration.  CKs have trended down.  2.  Acute kidney injury-secondary to dehydration and rhabdomyolysis.  Creatinine much improved with IV fluid hydration.  3.  History of HIV-continue patient's HAART  4.  Neuropathy-continue gabapentin.    5.  Depression-continue Celexa.    6.  Tobacco abuse-continue nicotine patch.    7.  Hyperlipidemia-continue atorvastatin.   Possible d/c today.   All the records are reviewed and case discussed with Care Management/Social Worker. Management plans discussed with the patient, family and they are in agreement.  CODE STATUS:  Full code  DVT Prophylaxis: Hep. SQ  TOTAL TIME TAKING CARE OF THIS PATIENT: 30 minutes.   POSSIBLE D/C IN 1-2 DAYS, DEPENDING ON CLINICAL CONDITION.   Houston Siren M.D on 04/09/2018 at 3:05 PM  Between 7am to 6pm - Pager - [phone]  After 6pm go to [email or web address] - Social research officer, government  [facility] [place] [facility]  Office  [phone]  CC: Primary care physician; System, Provider Not In

## 2018-04-10 NOTE — Progress Notes (Signed)
Roy Liter Callaway Sr. to be D/C'd Home per MD order.  Discussed prescriptions and follow up appointments with the [name]. Prescriptions given to [name], medication list explained in detail. Pt verbalized understanding.  Allergies as of 04/10/2018      Reactions   Lactose Other (See Comments)   GI distress   Pollen Extract Itching      Medication List    STOP taking these medications   tamsulosin 0.4 MG Caps capsule Commonly known as:  FLOMAX     TAKE these medications   acetaminophen 500 MG tablet Commonly known as:  TYLENOL Take 1 tablet (500 mg total) by mouth every 8 (eight) hours as needed.   albuterol 108 (90 Base) MCG/ACT inhaler Commonly known as:  PROVENTIL HFA;VENTOLIN HFA Inhale 2 puffs into the lungs every 4 (four) hours as needed for shortness of breath.   amLODipine 5 MG tablet Commonly known as:  NORVASC Take 1 tablet (5 mg total) by mouth daily.   aspirin EC 81 MG tablet Take 1 tablet (81 mg total) by mouth daily.   atorvastatin 40 MG tablet Commonly known as:  LIPITOR Take 1 tablet (40 mg total) by mouth daily at 6 PM.   citalopram 20 MG tablet Commonly known as:  CELEXA Take 1 tablet (20 mg total) by mouth daily.   docusate sodium 100 MG capsule Commonly known as:  COLACE Take 1 capsule (100 mg total) by mouth 2 (two) times daily.   dolutegravir 50 MG tablet Commonly known as:  TIVICAY Take 1 tablet (50 mg total) by mouth daily.   emtricitabine-tenofovir AF 200-25 MG tablet Commonly known as:  DESCOVY Take 1 tablet by mouth daily.   ergocalciferol 50000 units capsule Commonly known as:  VITAMIN D2 Take 1 capsule (50,000 Units total) by mouth once a week.   gabapentin 300 MG capsule Commonly known as:  NEURONTIN Take 1 capsule (300 mg total) by mouth 3 (three) times daily.   lisinopril 20 MG tablet Commonly known as:  PRINIVIL,ZESTRIL Take 1 tablet (20 mg total) by mouth daily.   meloxicam 7.5 MG tablet Commonly known as:  MOBIC Take  1 tablet (7.5 mg total) by mouth 2 (two) times daily.   oxymetazoline 0.05 % nasal spray Commonly known as:  AFRIN Place 1 spray into both nostrils 2 (two) times daily.   polyethylene glycol packet Commonly known as:  MIRALAX / GLYCOLAX Take 17 g by mouth daily.   traZODone 150 MG tablet Commonly known as:  DESYREL Take 1 tablet (150 mg total) by mouth at bedtime.       Vitals:   04/09/18 2009 04/10/18 0524  BP: 126/70 116/80  Pulse: (!) 59 60  Resp: 20 19  Temp: 99.3 F (37.4 C) 100 F (37.8 C)  SpO2: 99% 95%    Skin clean, dry and intact without evidence of skin break down, no evidence of skin tears noted. IV catheter discontinued intact. Site without signs and symptoms of complications. Dressing and pressure applied. Pt denies pain at this time. No complaints noted.  An After Visit Summary was printed and given to the [name]. pts case manager in the room [name] escorted via CM, and D/C via private auto.  Roy [name]

## 2018-04-10 NOTE — Clinical Social Work Note (Signed)
Patient discharged with his community care manager, to help him find housing options since patient was discharged from [employer] on Sunday per [employer].  CSW to sign off please reconsult if other social work needs arise.  Ervin Knack. Liborio Saccente, MSW, Theresia Majors [phone]  04/10/2018 10:19 AM

## 2018-04-11 NOTE — Discharge Summary (Signed)
[facility] - [place] at [facility]   PATIENT NAME: Roy [name]    MR#:  [id number]  DATE OF BIRTH:  1966-07-15  DATE OF ADMISSION:  04/08/2018 ADMITTING PHYSICIAN: Shaune Pollack, MD  DATE OF DISCHARGE: 04/10/2018 10:34 AM  PRIMARY CARE PHYSICIAN: System, Provider Not In    ADMISSION DIAGNOSIS:  Renal insufficiency [N28.9] Non-traumatic rhabdomyolysis [M62.82] Syncope, unspecified syncope type [R55] Chest pain, unspecified type [R07.9]  DISCHARGE DIAGNOSIS:  Active Problems:   ARF (acute renal failure) (HCC)   SECONDARY DIAGNOSIS:   Past Medical History:  Diagnosis Date  . AIDS (acquired immune deficiency syndrome) (HCC)   . Arthritis   . Asthma   . Bipolar disorder (HCC)   . Bronchitis   . Depression   . GERD (gastroesophageal reflux disease)   . Hepatitis C   . HIV (human immunodeficiency virus infection) (HCC)   . HTN (hypertension)     HOSPITAL COURSE:   52 yo male with PMH of HIV, GERD, hepatitis C, asthma, bipolar disorder who presented to the hospital due to weakness, dizziness and presyncope and noted to be in acute kidney injury with rhabdomyolysis.  1.  Acute rhabdomyolysis-secondary to patient working outside in the heat and being dehydrated. -Patient was aggressively hydrated with IV fluids, his CKs have trended down and he feels better.  2.  Acute kidney injury-secondary to dehydration and rhabdomyolysis.   -[name] creatinine has improved and is back to baseline.  3.  History of HIV- pt. Will continue patient's HAART  4.  Neuropathy-pt. Will continue gabapentin.    5.  Depression- pt. Will continue Celexa.    6.  Hyperlipidemia- pt. Will continue atorvastatin  Patient was given prescriptions to all his maintenance medications other than his HIV meds.  DISCHARGE CONDITIONS:   Stable  CONSULTS OBTAINED:    DRUG ALLERGIES:   Allergies  Allergen Reactions  . Lactose Other (See Comments)    GI distress  . Pollen  Extract Itching    DISCHARGE MEDICATIONS:   Allergies as of 04/10/2018      Reactions   Lactose Other (See Comments)   GI distress   Pollen Extract Itching      Medication List    STOP taking these medications   tamsulosin 0.4 MG Caps capsule Commonly known as:  FLOMAX     TAKE these medications   acetaminophen 500 MG tablet Commonly known as:  TYLENOL Take 1 tablet (500 mg total) by mouth every 8 (eight) hours as needed.   albuterol 108 (90 Base) MCG/ACT inhaler Commonly known as:  PROVENTIL HFA;VENTOLIN HFA Inhale 2 puffs into the lungs every 4 (four) hours as needed for shortness of breath.   amLODipine 5 MG tablet Commonly known as:  NORVASC Take 1 tablet (5 mg total) by mouth daily.   aspirin EC 81 MG tablet Take 1 tablet (81 mg total) by mouth daily.   atorvastatin 40 MG tablet Commonly known as:  LIPITOR Take 1 tablet (40 mg total) by mouth daily at 6 PM.   citalopram 20 MG tablet Commonly known as:  CELEXA Take 1 tablet (20 mg total) by mouth daily.   docusate sodium 100 MG capsule Commonly known as:  COLACE Take 1 capsule (100 mg total) by mouth 2 (two) times daily.   dolutegravir 50 MG tablet Commonly known as:  TIVICAY Take 1 tablet (50 mg total) by mouth daily.   emtricitabine-tenofovir AF 200-25 MG tablet Commonly known as:  DESCOVY Take 1 tablet by mouth  daily.   ergocalciferol 50000 units capsule Commonly known as:  VITAMIN D2 Take 1 capsule (50,000 Units total) by mouth once a week.   gabapentin 300 MG capsule Commonly known as:  NEURONTIN Take 1 capsule (300 mg total) by mouth 3 (three) times daily.   lisinopril 20 MG tablet Commonly known as:  PRINIVIL,ZESTRIL Take 1 tablet (20 mg total) by mouth daily.   meloxicam 7.5 MG tablet Commonly known as:  MOBIC Take 1 tablet (7.5 mg total) by mouth 2 (two) times daily.   oxymetazoline 0.05 % nasal spray Commonly known as:  AFRIN Place 1 spray into both nostrils 2 (two) times daily.    polyethylene glycol packet Commonly known as:  MIRALAX / GLYCOLAX Take 17 g by mouth daily.   traZODone 150 MG tablet Commonly known as:  DESYREL Take 1 tablet (150 mg total) by mouth at bedtime.         DISCHARGE INSTRUCTIONS:   DIET:  Cardiac diet  DISCHARGE CONDITION:  Stable  ACTIVITY:  Activity as tolerated  OXYGEN:  Home Oxygen: No.   Oxygen Delivery: room air  DISCHARGE LOCATION:  home   If you experience worsening of your admission symptoms, develop shortness of breath, life threatening emergency, suicidal or homicidal thoughts you must seek medical attention immediately by calling 911 or calling your MD immediately  if symptoms less severe.  You Must read complete instructions/literature along with all the possible adverse reactions/side effects for all the Medicines you take and that have been prescribed to you. Take any new Medicines after you have completely understood and accpet all the possible adverse reactions/side effects.   Please note  You were cared for by a hospitalist during your hospital stay. If you have any questions about your discharge medications or the care you received while you were in the hospital after you are discharged, you can call the unit and asked to speak with the hospitalist on call if the hospitalist that took care of you is not available. Once you are discharged, your primary care physician will handle any further medical issues. Please note that NO REFILLS for any discharge medications will be authorized once you are discharged, as it is imperative that you return to your primary care physician (or establish a relationship with a primary care physician if you do not have one) for your aftercare needs so that they can reassess your need for medications and monitor your lab values.     Today   No acute events overnight.  Feels much better.  CKs have trended down.  Renal function looks improved.  Will discharge home  today.  VITAL SIGNS:  Blood pressure 116/80, pulse 60, temperature 100 F (37.8 C), temperature source Oral, resp. rate 19, height 5\' 9"  (1.753 m), weight 102.5 kg (225 lb 15.5 oz), SpO2 95 %.  I/O:  No intake or output data in the 24 hours ending 04/11/18 1523  PHYSICAL EXAMINATION:  GENERAL:  52 y.o.-year-old patient lying in the bed with no acute distress.  EYES: Pupils equal, round, reactive to light and accommodation. No scleral icterus. Extraocular muscles intact.  HEENT: Head atraumatic, normocephalic. Oropharynx and nasopharynx clear.  NECK:  Supple, no jugular venous distention. No thyroid enlargement, no tenderness.  LUNGS: Normal breath sounds bilaterally, no wheezing, rales,rhonchi. No use of accessory muscles of respiration.  CARDIOVASCULAR: S1, S2 normal. No murmurs, rubs, or gallops.  ABDOMEN: Soft, non-tender, non-distended. Bowel sounds present. No organomegaly or mass.  EXTREMITIES: No pedal edema, cyanosis,  or clubbing.  NEUROLOGIC: Cranial nerves II through XII are intact. No focal motor or sensory defecits b/l.  PSYCHIATRIC: The patient is alert and oriented x 3.  SKIN: No obvious rash, lesion, or ulcer.   DATA REVIEW:   CBC Recent Labs  Lab 04/08/18 1344  WBC 6.3  HGB 15.2  HCT 44.4  PLT 241    Chemistries  Recent Labs  Lab 04/09/18 1313  NA 135  K 4.2  CL 106  CO2 24  GLUCOSE 97  BUN 23*  CREATININE 1.26*  CALCIUM 8.0*    Cardiac Enzymes Recent Labs  Lab 04/08/18 1802  TROPONINI <0.03    Microbiology Results  No results found for this or any previous visit.  RADIOLOGY:  No results found.    Management plans discussed with the patient, family and they are in agreement.  CODE STATUS:  Code Status History    Date Active Date Inactive Code Status Order ID Comments User Context   04/08/2018 2008 04/10/2018 1334 Full Code [id number]  Shaune Pollack, MD Inpatient   TOTAL TIME TAKING CARE OF THIS PATIENT: 40 minutes.    Houston Siren  M.D on 04/11/2018 at 3:23 PM  Between 7am to 6pm - Pager - [phone]  After 6pm go to [email or web address] - Social research officer, government  [facility] [place] [facility]  Office  [phone]  CC: Primary care physician; System, Provider Not In

## 2018-04-30 ENCOUNTER — Ambulatory Visit: Admit: 2018-04-30 | Discharge: 2018-04-30

## 2018-04-30 DIAGNOSIS — B20 Human immunodeficiency virus [HIV] disease: Principal | ICD-10-CM

## 2018-04-30 DIAGNOSIS — I10 Essential (primary) hypertension: Secondary | ICD-10-CM

## 2018-04-30 LAB — URINALYSIS
BACTERIA: NONE SEEN /HPF
BILIRUBIN UA: NEGATIVE
GLUCOSE UA: NEGATIVE
KETONES UA: NEGATIVE
LEUKOCYTE ESTERASE UA: NEGATIVE
NITRITE UA: NEGATIVE
PH UA: 5.5 (ref 5.0–9.0)
RBC UA: 1 /HPF (ref <=3)
SPECIFIC GRAVITY UA: 1.032 — ABNORMAL HIGH (ref 1.003–1.030)
SQUAMOUS EPITHELIAL: <1 /HPF (ref 0–5)
UROBILINOGEN UA: 0.2
WBC UA: <1 /HPF (ref <=2)

## 2018-04-30 LAB — ESTIMATED AVERAGE GLUCOSE: Estimated average glucose:MCnc:Pt:Bld:Qn:Estimated from glycated hemoglobin: 103

## 2018-04-30 LAB — GLUCOSE UA: Lab: NEGATIVE

## 2018-04-30 LAB — CBC W/ AUTO DIFF
BASOPHILS ABSOLUTE COUNT: 0 10*9/L (ref 0.0–0.1)
BASOPHILS RELATIVE PERCENT: 0.4 %
EOSINOPHILS RELATIVE PERCENT: 1.4 %
HEMATOCRIT: 44.7 % (ref 41.0–53.0)
HEMOGLOBIN: 14.7 g/dL (ref 13.5–17.5)
LARGE UNSTAINED CELLS: 2 % (ref 0–4)
LYMPHOCYTES ABSOLUTE COUNT: 1.9 10*9/L (ref 1.5–5.0)
LYMPHOCYTES RELATIVE PERCENT: 40.4 %
MEAN CORPUSCULAR HEMOGLOBIN CONC: 32.9 g/dL (ref 31.0–37.0)
MEAN CORPUSCULAR HEMOGLOBIN: 32.8 pg (ref 26.0–34.0)
MEAN CORPUSCULAR VOLUME: 99.9 fL (ref 80.0–100.0)
MEAN PLATELET VOLUME: 8.4 fL (ref 7.0–10.0)
MONOCYTES ABSOLUTE COUNT: 0.3 10*9/L (ref 0.2–0.8)
MONOCYTES RELATIVE PERCENT: 6.2 %
NEUTROPHILS ABSOLUTE COUNT: 2.3 10*9/L (ref 2.0–7.5)
NEUTROPHILS RELATIVE PERCENT: 49.2 %
PLATELET COUNT: 234 10*9/L (ref 150–440)
RED BLOOD CELL COUNT: 4.48 10*12/L — ABNORMAL LOW (ref 4.50–5.90)
RED CELL DISTRIBUTION WIDTH: 13.1 % (ref 12.0–15.0)
WBC ADJUSTED: 4.7 10*9/L (ref 4.5–11.0)

## 2018-04-30 LAB — HEMOGLOBIN A1C: ESTIMATED AVERAGE GLUCOSE: 103 mg/dL

## 2018-04-30 LAB — BASIC METABOLIC PANEL
ANION GAP: 11 mmol/L (ref 9–15)
BLOOD UREA NITROGEN: 24 mg/dL — ABNORMAL HIGH (ref 7–21)
CALCIUM: 9.5 mg/dL (ref 8.5–10.2)
CHLORIDE: 108 mmol/L — ABNORMAL HIGH (ref 98–107)
CO2: 24 mmol/L (ref 22.0–30.0)
EGFR MDRD AF AMER: >=60 mL/min/{1.73_m2} (ref >=60)
EGFR MDRD NON AF AMER: >=60 mL/min/{1.73_m2} (ref >=60)
GLUCOSE RANDOM: 88 mg/dL (ref 65–99)
POTASSIUM: 4.5 mmol/L (ref 3.5–5.0)

## 2018-04-30 LAB — BILIRUBIN TOTAL: Bilirubin:MCnc:Pt:Ser/Plas:Qn:: 0.3

## 2018-04-30 LAB — HEPATITIS A IGG: Hepatitis A virus Ab.IgG:PrThr:Pt:Ser:Ord:: REACTIVE — AB

## 2018-04-30 LAB — ALT (SGPT): Alanine aminotransferase:CCnc:Pt:Ser/Plas:Qn:: 26

## 2018-04-30 LAB — AST (SGOT): Aspartate aminotransferase:CCnc:Pt:Ser/Plas:Qn:: 24

## 2018-04-30 LAB — CREATININE: Creatinine:MCnc:Pt:Ser/Plas:Qn:: 1.26

## 2018-04-30 LAB — NEUTROPHILS ABSOLUTE COUNT: Lab: 2.3

## 2018-04-30 LAB — HEPATITIS B SURFACE ANTIBODY QUANT: Hepatitis B virus surface Ab:ACnc:Pt:Ser:Qn:: 536.8 — ABNORMAL HIGH

## 2018-04-30 MED ORDER — BICTEGRAVIR 50 MG-EMTRICITABINE 200 MG-TENOFOVIR ALAFENAM 25 MG TABLET
ORAL_TABLET | ORAL | 1 refills | 0 days
Start: 2018-04-30 — End: 2019-04-30

## 2018-04-30 MED ORDER — BICTEGRAVIR 50 MG-EMTRICITABINE 200 MG-TENOFOVIR ALAFENAM 25 MG TABLET
ORAL_TABLET | Freq: Every day | ORAL | 2 refills | 0 days | Status: CP
Start: 2018-04-30 — End: 2018-05-14

## 2018-04-30 MED FILL — BIKTARVY/50-200-25MG/TABS: BIKTARVY/50-200-25MG/TABS | 30 days supply | Qty: 30 | Fill #0

## 2018-04-30 NOTE — Unmapped (Signed)
Assessment/Plan:      Samuel Baker, a 52 y.o. male seen today to re-establish HIV care in our clinic following incarceration. The bulk of today's visit was spent working to resume access to medication and addressing social needs.     Plan:  HIV  Concerning lapse in ART (has happened intermittently in past). Usually fills ART via Medicaid but Medicaid lapsed .   ?? For manufacturer assistance for [place] until [place] is reinstated. Also applying for HMAP. E-prescribed today.  ?? Checking HIV RNA & safety labs (brief return).  ?? Discussed specific strategies to improve ARV adherence.  Lab Results   Component Value Date    ACD4 520 03/28/2017    CD4 22 (L) 03/28/2017    HIVCP 51 (H) 03/28/2017    HIVRS Detected (A) 02/28/2017       Hepatitis C  ?? Treated in prison by Dr. Anne Shutter 08/2017-11/2017 (ELB/GRAZ).     Sexual health & secondary prevention  Sex with women. Monogamous with single partner (also HIV+ and on ART).    Lab Results   Component Value Date    RPR Nonreactive 02/28/2017    LABRPR NON-REACTIVE 01/24/2015    CTNAA Negative 01/06/2017    CTNAA Negative 07/24/2016    CTNAA Negative 05/25/2016    GCNAA Negative 01/06/2017    GCNAA Negative 07/24/2016    GCNAA Negative 05/25/2016    SPECTYPE Urine 01/06/2017    SPECTYPE Urine 07/24/2016    SPECTYPE Urine 05/25/2016    SPECSOURCE Urine 01/06/2017    SPECSOURCE Urine 07/24/2016    SPECSOURCE Urine 05/25/2016     ?? GC/CT NAATs -- needed but deferred to future visit  ?? RPR -- for screening obtained today    Health maintenance  Lab Results   Component Value Date    CREATININE 1.26 03/28/2017    QFTTBGOLD Negative 06/06/2015    HCVRNAIU 1,610,960 10/31/2015    HCVIU 454098 03/08/2015    CHOL 161 02/28/2017    HDL 67 (H) 02/28/2017    LDL 62 02/28/2017    NONHDL 94 02/28/2017    TRIG 119 (H) 02/28/2017    A1C 5.7 02/28/2017    FINALDX  01/06/2016     A: Stomach, biopsy   - Gastric fundic mucosa with chronic superficial gastritis   - Gastric antral mucosa with mildly active chronic superficial gastritis and reactive foveolar hyperplasia  - No Helicobacter pylori identified on H&E stain        Communicable diseases  # TB - no longer needed; negative PPD 2016 and 0mm PPD 04/04/17 in prison and low/no risk  # HCV - s/p HCV tx 08/2017-11/2017 (genotype 1, ELB/GRAZ); rescreen w/RNA q1-2y    Cancer screening  # Anorectal - not yet done  # Colorectal - done with colonoscopy 2016 - repeat 5Y - DUE 2021  # Liver - neg for [facility] by ultrasound 2017 but had liver heterogeneity at that time. Now HCV is cured but will address HCC surveillance at a future visit.  # Lung - not yet done - refer to HIV/pulm clinic at future visit.  # Prostate - Has history of BPH, not address today. Needs shared decision-making re PSA and cancer screening.    Cardiovascular disease  # The 10-year ASCVD risk score Denman George DC Jr., et al., 2013) is: 16.4% (largely driven by smoking)    Immunization History   Administered Date(s) Administered   ??? Hepatitis A 07/04/2017   ??? Hepatitis B, Adult 10/12/2008, 02/15/2009, 07/04/2017   ???  INFLUENZA TIV (TRI) PF (IM) 10/12/2008, 09/30/2009, 12/05/2010, 09/02/2012   ??? Influenza Vaccine Quad (IIV4 PF) 11mo+ injectable 08/21/2013, 09/03/2014, 10/31/2015, 10/02/2016, 02/21/2017   ??? PNEUMOCOCCAL POLYSACCHARIDE 23 04/20/2008, 08/25/2013, 02/21/2017   ??? PPD Test 10/12/2008, 02/15/2009, 03/08/2009, 01/03/2010, 04/11/2010, 06/19/2011, 09/02/2012   ??? Pneumococcal Conjugate 13-Valent 12/02/2012   ??? TdaP 04/20/2008     ?? Screening ordered today: none  ?? Immunizations ordered today: none    Counseling services took more than 50% of today's visit time. An additional 20 minutes was spent reviewing records from prison system and recent hospitalization.  Counseled as documented above regarding medication adherence.    Disposition  Return to clinic 4 weeks or sooner if needed.    Amparo Bristol, MD, MPH   [facility]   [address], 1st floor   [place], [place]. [address] Phone: [phone]   Fax: [phone]        Subjective:      Chief Complaint   HIV followup    HPI  Return patient visit for Samuel Baker, a 52 y.o. man establishing care with me. He has a history of HIV dx in 2006 while at [facility] (initially on TDF/FTC/EFV, then TDF/FTC/RPV, then TDF/FTC/DTG --> TAF/FTC/DTG and now TAF/FTC/BIC started within the past year.)  PMH is significant for HCV (genotype 1a, treated 9-11/2017), HTN, arthritis, depression, substance use (cocaine, denies recent/current use).  Just released from prison 03/2017 - out of meds, Medicaid not yet reactivated. Has been off of Biktarvy for around 3 weeks.  Recently admitted to [facility] with mild rhabdo and dehydration after working outside. CK around 2000, Cr around 2 per Care Everywhere.   At present he is worried about being off of meds. Unsettled re current housing situation (see Social History) and very hungry.  Mental health is ok - denies SI. Feels very anxious about his housing situation.  No fevers/chills/myalgias/NS.    Past Medical History:   Diagnosis Date   ??? Allergic rhinitis    ??? Arthritis    ??? Bipolar disorder (CMS-HCC)    ??? Depression    ??? Depressive disorder    ??? GERD (gastroesophageal reflux disease)    ??? Hepatitis C     genotype 1   ??? HIV disease (CMS-HCC)    ??? HLD (hyperlipidemia)    ??? Hypertension    ??? Kidney stone    ??? Polysubstance abuse    ??? Psychiatric Hospitalizations    ??? Psychiatric Medication Trials    ??? Psychosis    ??? Substance abuse        Medications and Allergies - currently out of most of his meds.  Reviewed and updated today.     Allergies   Allergen Reactions   ??? Pollen Extracts Itching     Social History  ?? Homeless, [place] He cannot read. Does want to learn.  ?? Staying with cousin, ran out of days at shelter. Very worried as cousin is selling drugs and he is worried he will get reincarcerated.  Has h/o incarceration 2007-2009.  ?? Feels he doesn't get enough to eat - the people he is staying with don't keep much food in the house.   ?? 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  ?? Smoking again due to stress, has about 45-50 py hx (started as a little kid, around 1-2 ppd as an adult). Drinking alcohol - around 6 pack beer every other day.  ??  Sex with women - has a steady partner [name]) also positive. They have been together 14 years. Has 2 grown boys with his first wife (they also live in [place], they have their own families and are not able to help him).   ?? His entire family knows he is HIV+ and per records have been supportive.    Review of Systems  As per HPI. Remainder of 10 systems reviewed, negative.        Objective:      BP 116/79  - Pulse 72  - Temp 36.8 ??C (98.3 ??F) (Oral)  - Ht 175.3 cm (5' 9)  - Wt 98.9 kg (218 lb)  - SpO2 98%  - BMI 32.19 kg/m??   Wt Readings from Last 3 Encounters:   03/28/17 98.3 kg (216 lb 11.2 oz)   03/03/17 (!) 102.2 kg (225 lb 4.8 oz)   01/14/17 96.5 kg (212 lb 10.1 oz)     Const attentive, alert, appropriate   Eyes sclerae anicteric, noninjected OU   ENT no thrush, leukoplakia or oral lesions   Lymph no cervical or supraclavicular LAD   CV RRR. No murmurs. No rub or gallop. S1/S2.   Resp CTAB ant/post, normal work of breathing, no wheeze, breath sounds diminished throughout.   GI Soft, no organomegaly. NTND. NABS.   GU deferred   Rectal deferred   Skin no petechiae, ecchymoses or obvious rashes on clothed exam   MSK Antalgic gait sparing R hip. No effusions, full exam deferred today.   Neuro CN II-XII grossly intact, MAEE, non focal   Psych Appropriate affect. Eye contact good. Linear thoughts. Fluent speech.     Laboratory Data  Reviewed in Epic today, using Synopsis and Chart Review filters.    Lab Results   Component Value Date    CREATININE 1.26 03/28/2017    QFTTBGOLD Negative 06/06/2015    HCVRNAIU 2,440,102 10/31/2015    HCVIU [id number] 03/08/2015    CHOL 161 02/28/2017    HDL 67 (H) 02/28/2017    LDL 62 02/28/2017 NONHDL 94 02/28/2017    TRIG 440 (H) 02/28/2017    A1C 5.7 02/28/2017    FINALDX  01/06/2016     A: Stomach, biopsy   - Gastric fundic mucosa with chronic superficial gastritis   - Gastric antral mucosa with mildly active chronic superficial gastritis and reactive foveolar hyperplasia  - No Helicobacter pylori identified on H&E stain                   _____________________________________________________________________

## 2018-04-30 NOTE — Unmapped (Signed)
Met w/ pt today in clinic. Pt recently released from being incarcerated. While incarcerated pt lost his medicaid and social security benefits. Pts case manager was with him today and informed me that he submitted application to get benefits re-instated. Application was submitted 04/23/18. In the meantime, pt needs a way to access medication. Completed HMAP application w/ pt today. Application will be mailed to [facility] today for review. Pt is familiar w/ program. Advised pt that although it is May, if Medicaid is not re-instated he will need to complete HMAP summer renewal July1-Aug 15th.     A financial assessment was completed to determine need and eligibility for patient financial assistance programs.    Household FPL: 0%  Individual FPL: 0%    Current Insurance status:   Uninsured     Patient is currently enrolled in the following financial support programs (check all that apply):  HMAP (State HIV Medication Assistance Program)- Formerly ADAP,      [employer] subprogram (Pharmacy assistance for uninsured patients), [facility], [facility] Pharmacy Assistance and [facility] Pharmacy Assistance    Patient may qualify for other financial support programs (check all that apply):  HMAP (State HIV Medication Assistance Program)- Formerly ADAP,      [employer] subprogram (Pharmacy assistance for uninsured patients), [facility], [facility] Pharmacy Assistance and [employer] Pharmacy Assistance    Interventions Completed (check all that apply):  [employer] Program Eligibility Started - , [employer] entered for individual FPL <300%, HMAP Application Completed and Referred to [facility] to receive assistance with provider and hospital bills    For uninsured patients:  [employer] Screener completed: yes    Outcome of Diplomatic Services operational officer (check all that apply):  Under income requirements      Comments and Follow up plan:    Pt resubmitted application for Medicaid     Time Duration of intervention in minutes: 20 minutes

## 2018-04-30 NOTE — Unmapped (Signed)
Addended byBlair Promise on: 04/30/2018 03:43 PM     Modules accepted: Orders

## 2018-04-30 NOTE — Unmapped (Signed)
Adherence & Medication Management Pharmacist Note    Referral reason: Medication access/adherence  Referring provider: Amparo Bristol, MD    HPI: [name] is a 52 year old male who presents to the ID clinic today for HIV follow-up. He was recently release from prison (around the second week of April). [name] ART regimen is BIC/TAF/FTC. He last received the medication last month and has been off of treatment x 3-4 weeks.     He has come to clinic to get re-established in care. He has some medications on hand that were dispensed to him from the prison upon his release.     HIV-related labs:  Lab Results   Component Value Date/Time    HIVRS Detected (A) 02/28/2017 08:10 PM    HIVRS Not Detected 07/31/2016 09:35 AM    HIVRS Not Detected 05/25/2016 12:00 PM    HIVRS Not Detected 01/24/2015 10:06 AM    HIVRS Detected 10/25/2014 11:22 AM    HIVRS Not Detected 09/03/2014 12:29 PM    HIVCP 51 (H) 03/28/2017 02:39 PM    HIVCP <40 (H) 02/28/2017 08:10 PM    HIVCP <40 10/31/2015 12:32 PM    HIVCP <40 10/25/2014 11:22 AM    HIVCP <40 07/20/2014 10:20 AM    HIVCP 508 08/25/2013 12:20 PM    HIV10 1.71 (H) 03/28/2017 02:39 PM    HIV10  02/28/2017 08:10 PM      Comment:      <1.6 log    HIV10  10/31/2015 12:32 PM      Comment:      <1.6 log    HIV10 <1.60 10/25/2014 11:22 AM    HIV10 <1.60 07/20/2014 10:20 AM    HIV10 2.71 08/25/2013 12:20 PM    HIVCM  03/28/2017 02:39 PM      Comment:      HIV-1 quantification by real-time RT-PCR is performed using the Abbott RealTime  HIV-1 test. This test is FDA approved and can quantitate HIV-1 RNA over the  range of 40 - 1,000,000 copies/mL (1.60 log(10) -6.00 log(10) copies/mL). The reference range for this assay is Not Detected.    HIVCM  02/28/2017 08:10 PM      Comment:      HIV-1 quantification by real-time RT-PCR is performed using the Abbott RealTime  HIV-1 test. This test is FDA approved and can quantitate HIV-1 RNA over the  range of 40 - 1,000,000 copies/mL (1.60 log(10) -6.00 log(10) copies/mL). The reference range for this assay is Not Detected.    HIVCM  07/31/2016 09:35 AM      Comment:      HIV-1 quantification by real-time RT-PCR is performed using the Abbott RealTime  HIV-1 test. This test is FDA approved and can quantitate HIV-1 RNA over the  range of 40 - 1,000,000 copies/mL (1.60 log(10) -6.00 log(10) copies/mL). The reference range for this assay is Not Detected.    HIVCM : 01/24/2015 10:06 AM    HIVCM : 10/25/2014 11:22 AM    HIVCM : 09/03/2014 12:29 PM    ACD4 520 03/28/2017 02:39 PM    ACD4 797 02/28/2017 08:10 PM    ACD4 578 05/25/2016 12:00 PM    ACD4 644 01/24/2015 10:06 AM    ACD4 469 (L) 10/25/2014 11:22 AM    ACD4 274 (L) 07/20/2014 10:20 AM       Adherence Assessment:  Self-reported adherence to ART in past 7 days Missed doses due to not having medications   Refill history in past 3  months N/A   Pill-box N/A   Medication administration N/A   Food requirements N/A     Barriers to adherence:  Access to medications: Medicaid lapsed  Transportation: ???  Financial: ???  Motivation: pt has been motivated  Side effects: none when he was on treatment    Drug interaction review (if applicable): none currently    Assessment/Plan:  1. HIV:  - Not adherence due to lack of access after prison release. He is doing well overall and wants to start taking medication.   - Previously ND with a few blips  - Manufacturer assistance has been applied for and patient will p/u Biktarvy today.   - SW will work with patient on medication access   -Based on adherence assessment, patient has good/partial/poor adherence    2. HCV:  - Treated with Zepatier in Prison and cured. HCV VL will be assessed to confirm cure.     3. Follow-up with patient via telephone in 1 week (05/07/18) and will have an office visit on 06/11/18.    Time Spent During Encounter: 30 minutes    Tonie Griffith, PharmD, BCPS, AAHIVP, CPP  Infectious Disease Clinical Pharmacy Practitioner   [facility] Direct line: [phone]

## 2018-04-30 NOTE — Unmapped (Signed)
Completed [employer] IMMEDIATE access w/ pt today in clinic. Pt is to pick up Biktarvy medication today. Pt is on several other medications in which he was advised to apply to [employer] pharmacy assistance to access those medications until HMAP has been approved. [facility] pharmacy billing information listed below. Also completed [employer] application w/ pt in case pt needs a second fill of Biktarvy while HMAP is processing.     ID# [id number]    BIN#  [id number]    PCN#  [id number]    Group#  [id number]    Avel Peace

## 2018-04-30 NOTE — Unmapped (Signed)
Patient cannot read. Discussed plan of care.

## 2018-04-30 NOTE — Unmapped (Signed)
This SW met with Samuel Baker at the request of his provider, Dr. Rosemarie Beath, to discuss housing and food resources. Samuel Baker shared that since he has been released from prison, he has been staying with his cousin. He explained that they do not own any dishes or cooking utensils, so he isn't able to cook for himself. He reports that he hasn't eaten in the past two days, so this SW provided a food voucher. Samuel Baker explained that he would prefer to stay in a shelter in [place] until his disability is reinstated. This SW provided the information about [place]'s homelessness services to Samuel Baker and his caseworker Samuel Baker since Samuel Baker will need to go to walk in hours at Samuel Baker to receive a shelter referral. This SW will also help Samuel Baker schedule an appointment with his PCP and will contact him at [phone] once the appointment has been scheduled. Samuel Baker asked for assistance with the cost of parking. This SW provided 3 RWB parking passes to cover the cost of parking during today's clinic visit since he is RW eligible.     Duration of intervention: 20 minutes      Chelesea Weiand, LCSWA, CCM

## 2018-05-01 LAB — SYPHILIS RPR SCREEN: Reagin Ab:PrThr:Pt:Ser:Ord:RPR: NONREACTIVE

## 2018-05-05 LAB — HIV RNA, QUANTITATIVE, PCR
HIV RNA LOG(10): 1.9 {Log_copies}/mL — ABNORMAL HIGH (ref <0.00)
HIV RNA: 79 {copies}/mL — ABNORMAL HIGH (ref <0)

## 2018-05-05 LAB — HIV RNA: HIV 1 RNA:NCnc:Pt:Ser/Plas:Qn:Probe.amp.tar: 79 — ABNORMAL HIGH

## 2018-05-05 NOTE — Unmapped (Signed)
Duration of Intervention: 5 Minutes    SW completed and mailed the Medical Report on Adult with Allegation of HIV Infection form that pt provided to clinic on date of last appointment.  This is to assist pt with SSI eligibility now that he has been released from prison.    Arlyn Dunning, MSW, LCSW

## 2018-05-09 NOTE — Unmapped (Signed)
Rcvd lttr from [facility] indicating that pt has been enrolled in program from 04/30/18 to 04/30/19 to access medication until HMAP has been approved. Pharmacy billing information is listed below.     ID#  [id number]    BIN#  [id number]    Grp#  [id number]    PCN#  [id number]        Samuel Baker

## 2018-05-14 MED ORDER — LISINOPRIL 20 MG TABLET
ORAL_TABLET | Freq: Every day | ORAL | 11 refills | 0.00000 days | Status: CP
Start: 2018-05-14 — End: 2019-02-06

## 2018-05-14 MED ORDER — AMLODIPINE 5 MG TABLET
ORAL_TABLET | Freq: Every day | ORAL | 11 refills | 0 days | Status: CP
Start: 2018-05-14 — End: 2019-04-16

## 2018-05-14 MED ORDER — BICTEGRAVIR 50 MG-EMTRICITABINE 200 MG-TENOFOVIR ALAFENAM 25 MG TABLET
ORAL_TABLET | Freq: Every day | ORAL | 11 refills | 0 days | Status: CP
Start: 2018-05-14 — End: 2019-02-06

## 2018-05-14 MED ORDER — GABAPENTIN 300 MG CAPSULE
ORAL_CAPSULE | Freq: Three times a day (TID) | ORAL | 3 refills | 0 days | Status: CP
Start: 2018-05-14 — End: 2019-05-07

## 2018-05-14 MED ORDER — ATORVASTATIN 40 MG TABLET
ORAL_TABLET | Freq: Every day | ORAL | 4 refills | 0 days | Status: CP
Start: 2018-05-14 — End: ?

## 2018-05-14 NOTE — Unmapped (Signed)
Rcvd lttr from [name] indicating that pt has been approved for HMAP until 09/15/2018. Sent message to nurse to have provider send prescriptions to [facility] specialty pharmacy.     Tried reaching pt at number located in epic to inform of approval. A male answered and stated pt was unavailable at time of call. She took my name and number and stated she would have pt return call.     Samuel Baker

## 2018-05-14 NOTE — Unmapped (Signed)
Rcvd return call from pt. Informed pt about HMAP approval. Advised pt that I sent notification to nurse informing of approval and to have provider to send prescriptions to [facility] specialty pharmacy. Provided pt w/ [facility] client line contact info.     Samuel Baker

## 2018-05-26 ENCOUNTER — Inpatient Hospital Stay
Admission: EM | Admit: 2018-05-26 | Discharge: 2018-05-27 | DRG: 311 | Disposition: A | Discharge status: Other Acute Inpatient Hospital | Payer: Medicaid Other | Attending: Internal Medicine | Admitting: Internal Medicine

## 2018-05-26 ENCOUNTER — Emergency Department: Payer: Medicaid Other

## 2018-05-26 ENCOUNTER — Other Ambulatory Visit: Payer: Self-pay

## 2018-05-26 DIAGNOSIS — K219 Gastro-esophageal reflux disease without esophagitis: Secondary | ICD-10-CM | POA: Diagnosis present

## 2018-05-26 DIAGNOSIS — Z79891 Long term (current) use of opiate analgesic: Secondary | ICD-10-CM

## 2018-05-26 DIAGNOSIS — B20 Human immunodeficiency virus [HIV] disease: Secondary | ICD-10-CM | POA: Diagnosis present

## 2018-05-26 DIAGNOSIS — N400 Enlarged prostate without lower urinary tract symptoms: Secondary | ICD-10-CM | POA: Diagnosis present

## 2018-05-26 DIAGNOSIS — Z590 Homelessness: Secondary | ICD-10-CM

## 2018-05-26 DIAGNOSIS — I209 Angina pectoris, unspecified: Secondary | ICD-10-CM

## 2018-05-26 DIAGNOSIS — F332 Major depressive disorder, recurrent severe without psychotic features: Secondary | ICD-10-CM | POA: Diagnosis present

## 2018-05-26 DIAGNOSIS — Z9114 Patient's other noncompliance with medication regimen: Secondary | ICD-10-CM

## 2018-05-26 DIAGNOSIS — E785 Hyperlipidemia, unspecified: Secondary | ICD-10-CM | POA: Diagnosis present

## 2018-05-26 DIAGNOSIS — Z91040 Latex allergy status: Secondary | ICD-10-CM

## 2018-05-26 DIAGNOSIS — F17220 Nicotine dependence, chewing tobacco, uncomplicated: Secondary | ICD-10-CM | POA: Diagnosis present

## 2018-05-26 DIAGNOSIS — B1920 Unspecified viral hepatitis C without hepatic coma: Secondary | ICD-10-CM | POA: Diagnosis present

## 2018-05-26 DIAGNOSIS — F17210 Nicotine dependence, cigarettes, uncomplicated: Secondary | ICD-10-CM | POA: Diagnosis present

## 2018-05-26 DIAGNOSIS — F1420 Cocaine dependence, uncomplicated: Secondary | ICD-10-CM | POA: Diagnosis present

## 2018-05-26 DIAGNOSIS — Z7982 Long term (current) use of aspirin: Secondary | ICD-10-CM

## 2018-05-26 DIAGNOSIS — F1424 Cocaine dependence with cocaine-induced mood disorder: Secondary | ICD-10-CM | POA: Diagnosis present

## 2018-05-26 DIAGNOSIS — Y636 Underdosing and nonadministration of necessary drug, medicament or biological substance: Secondary | ICD-10-CM | POA: Diagnosis present

## 2018-05-26 DIAGNOSIS — I10 Essential (primary) hypertension: Secondary | ICD-10-CM | POA: Diagnosis present

## 2018-05-26 DIAGNOSIS — Z888 Allergy status to other drugs, medicaments and biological substances status: Secondary | ICD-10-CM

## 2018-05-26 DIAGNOSIS — R079 Chest pain, unspecified: Secondary | ICD-10-CM | POA: Diagnosis present

## 2018-05-26 DIAGNOSIS — F1994 Other psychoactive substance use, unspecified with psychoactive substance-induced mood disorder: Secondary | ICD-10-CM | POA: Diagnosis present

## 2018-05-26 DIAGNOSIS — R45851 Suicidal ideations: Secondary | ICD-10-CM | POA: Diagnosis present

## 2018-05-26 DIAGNOSIS — Z79899 Other long term (current) drug therapy: Secondary | ICD-10-CM

## 2018-05-26 LAB — COMPREHENSIVE METABOLIC PANEL
ALBUMIN: 3.8 g/dL (ref 3.5–5.0)
ALK PHOS: 68 U/L (ref 38–126)
ALT: 19 U/L (ref 17–63)
ANION GAP: 10 (ref 5–15)
AST: 22 U/L (ref 15–41)
BUN: 20 mg/dL (ref 6–20)
CALCIUM: 9.1 mg/dL (ref 8.9–10.3)
CO2: 23 mmol/L (ref 22–32)
Chloride: 106 mmol/L (ref 101–111)
Creatinine, Ser: 1.21 mg/dL (ref 0.61–1.24)
Glucose, Bld: 97 mg/dL (ref 65–99)
POTASSIUM: 3.4 mmol/L — AB (ref 3.5–5.1)
Sodium: 139 mmol/L (ref 135–145)
Total Bilirubin: 0.5 mg/dL (ref 0.3–1.2)
Total Protein: 7.4 g/dL (ref 6.5–8.1)

## 2018-05-26 LAB — CBC WITH DIFFERENTIAL/PLATELET
BASOS PCT: 1 %
Basophils Absolute: 0 10*3/uL (ref 0–0.1)
EOS ABS: 0 10*3/uL (ref 0–0.7)
EOS PCT: 1 %
HCT: 42.1 % (ref 40.0–52.0)
HEMOGLOBIN: 14.2 g/dL (ref 13.0–18.0)
Lymphocytes Relative: 41 %
Lymphs Abs: 1.6 10*3/uL (ref 1.0–3.6)
MCH: 33.7 pg (ref 26.0–34.0)
MCHC: 33.7 g/dL (ref 32.0–36.0)
MCV: 99.8 fL (ref 80.0–100.0)
Monocytes Absolute: 0.3 10*3/uL (ref 0.2–1.0)
Monocytes Relative: 7 %
NEUTROS PCT: 50 %
Neutro Abs: 2 10*3/uL (ref 1.4–6.5)
PLATELETS: 176 10*3/uL (ref 150–440)
RBC: 4.22 MIL/uL — AB (ref 4.40–5.90)
RDW: 13.2 % (ref 11.5–14.5)
WBC: 3.9 10*3/uL (ref 3.8–10.6)

## 2018-05-26 LAB — URINE DRUG SCREEN, QUALITATIVE (ARMC ONLY)
AMPHETAMINES, UR SCREEN: NOT DETECTED
BENZODIAZEPINE, UR SCRN: NOT DETECTED
Barbiturates, Ur Screen: NOT DETECTED
CANNABINOID 50 NG, UR ~~LOC~~: NOT DETECTED
Cocaine Metabolite,Ur ~~LOC~~: POSITIVE — AB
MDMA (Ecstasy)Ur Screen: NOT DETECTED
Methadone Scn, Ur: NOT DETECTED
Opiate, Ur Screen: NOT DETECTED
PHENCYCLIDINE (PCP) UR S: NOT DETECTED
TRICYCLIC, UR SCREEN: NOT DETECTED

## 2018-05-26 LAB — ACETAMINOPHEN LEVEL: Acetaminophen (Tylenol), Serum: <10 ug/mL — ABNORMAL LOW (ref 10–30)

## 2018-05-26 LAB — CK: CK TOTAL: 319 U/L (ref 49–397)

## 2018-05-26 LAB — ETHANOL

## 2018-05-26 LAB — SALICYLATE LEVEL

## 2018-05-26 LAB — TROPONIN I

## 2018-05-26 LAB — BRAIN NATRIURETIC PEPTIDE: B NATRIURETIC PEPTIDE 5: 21 pg/mL (ref 0.0–100.0)

## 2018-05-26 MED ORDER — GI COCKTAIL ~~LOC~~
30.0000 mL | Freq: Once | ORAL | Status: AC
  Administered 2018-05-26: 30 mL via ORAL

## 2018-05-26 MED ORDER — GI COCKTAIL ~~LOC~~
ORAL | Status: AC
  Administered 2018-05-26: 30 mL via ORAL
  Filled 2018-05-26: qty 60

## 2018-05-26 MED ORDER — ASPIRIN 81 MG PO CHEW
162.0000 mg | CHEWABLE_TABLET | Freq: Once | ORAL | Status: AC
  Administered 2018-05-26: 162 mg via ORAL
  Filled 2018-05-26: qty 2
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
## 2018-05-26 NOTE — ED Notes (Signed)
Pt changed out of clothing and placed into hospital approved wine colored scrubs, green pants, belt belt, shirt , black colored wallet , no money or credit card, shoes, soak
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
## 2018-05-26 NOTE — ED Notes (Signed)
Pt given sandwich tray and ginger ale. 
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
## 2018-05-27 ENCOUNTER — Inpatient Hospital Stay
Admission: AD | Admit: 2018-05-27 | Discharge: 2018-06-03 | DRG: 885 | Disposition: A | Discharge status: Home / Self Care | Payer: No Typology Code available for payment source | Source: Intra-hospital | Attending: Psychiatry | Admitting: Psychiatry

## 2018-05-27 DIAGNOSIS — F1424 Cocaine dependence with cocaine-induced mood disorder: Secondary | ICD-10-CM | POA: Diagnosis present

## 2018-05-27 DIAGNOSIS — Z91040 Latex allergy status: Secondary | ICD-10-CM | POA: Diagnosis not present

## 2018-05-27 DIAGNOSIS — K5900 Constipation, unspecified: Secondary | ICD-10-CM | POA: Diagnosis present

## 2018-05-27 DIAGNOSIS — Z590 Homelessness: Secondary | ICD-10-CM

## 2018-05-27 DIAGNOSIS — N400 Enlarged prostate without lower urinary tract symptoms: Secondary | ICD-10-CM | POA: Diagnosis present

## 2018-05-27 DIAGNOSIS — G4700 Insomnia, unspecified: Secondary | ICD-10-CM | POA: Diagnosis present

## 2018-05-27 DIAGNOSIS — F332 Major depressive disorder, recurrent severe without psychotic features: Principal | ICD-10-CM | POA: Diagnosis present

## 2018-05-27 DIAGNOSIS — Z7982 Long term (current) use of aspirin: Secondary | ICD-10-CM

## 2018-05-27 DIAGNOSIS — R45851 Suicidal ideations: Secondary | ICD-10-CM | POA: Diagnosis present

## 2018-05-27 DIAGNOSIS — F1410 Cocaine abuse, uncomplicated: Secondary | ICD-10-CM | POA: Diagnosis present

## 2018-05-27 DIAGNOSIS — Z79899 Other long term (current) drug therapy: Secondary | ICD-10-CM

## 2018-05-27 DIAGNOSIS — J45909 Unspecified asthma, uncomplicated: Secondary | ICD-10-CM | POA: Diagnosis present

## 2018-05-27 DIAGNOSIS — M1990 Unspecified osteoarthritis, unspecified site: Secondary | ICD-10-CM | POA: Diagnosis present

## 2018-05-27 DIAGNOSIS — Z9114 Patient's other noncompliance with medication regimen: Secondary | ICD-10-CM | POA: Diagnosis not present

## 2018-05-27 DIAGNOSIS — Z888 Allergy status to other drugs, medicaments and biological substances status: Secondary | ICD-10-CM | POA: Diagnosis not present

## 2018-05-27 DIAGNOSIS — F419 Anxiety disorder, unspecified: Secondary | ICD-10-CM | POA: Diagnosis present

## 2018-05-27 DIAGNOSIS — Z79891 Long term (current) use of opiate analgesic: Secondary | ICD-10-CM | POA: Diagnosis not present

## 2018-05-27 DIAGNOSIS — K219 Gastro-esophageal reflux disease without esophagitis: Secondary | ICD-10-CM | POA: Diagnosis present

## 2018-05-27 DIAGNOSIS — I10 Essential (primary) hypertension: Secondary | ICD-10-CM | POA: Diagnosis present

## 2018-05-27 DIAGNOSIS — E785 Hyperlipidemia, unspecified: Secondary | ICD-10-CM | POA: Diagnosis present

## 2018-05-27 DIAGNOSIS — F17210 Nicotine dependence, cigarettes, uncomplicated: Secondary | ICD-10-CM | POA: Diagnosis present

## 2018-05-27 DIAGNOSIS — Y636 Underdosing and nonadministration of necessary drug, medicament or biological substance: Secondary | ICD-10-CM | POA: Diagnosis present

## 2018-05-27 DIAGNOSIS — R079 Chest pain, unspecified: Secondary | ICD-10-CM | POA: Diagnosis present

## 2018-05-27 DIAGNOSIS — B1920 Unspecified viral hepatitis C without hepatic coma: Secondary | ICD-10-CM | POA: Diagnosis present

## 2018-05-27 DIAGNOSIS — I209 Angina pectoris, unspecified: Secondary | ICD-10-CM | POA: Diagnosis not present

## 2018-05-27 DIAGNOSIS — F1994 Other psychoactive substance use, unspecified with psychoactive substance-induced mood disorder: Secondary | ICD-10-CM | POA: Diagnosis present

## 2018-05-27 DIAGNOSIS — B20 Human immunodeficiency virus [HIV] disease: Secondary | ICD-10-CM | POA: Diagnosis present

## 2018-05-27 DIAGNOSIS — F17220 Nicotine dependence, chewing tobacco, uncomplicated: Secondary | ICD-10-CM | POA: Diagnosis present

## 2018-05-27 LAB — GLUCOSE, CAPILLARY: Glucose-Capillary: 104 mg/dL — ABNORMAL HIGH (ref 65–99)

## 2018-05-27 LAB — BASIC METABOLIC PANEL
Anion gap: 7 (ref 5–15)
BUN: 21 mg/dL — AB (ref 6–20)
CHLORIDE: 108 mmol/L (ref 101–111)
CO2: 23 mmol/L (ref 22–32)
CREATININE: 1.14 mg/dL (ref 0.61–1.24)
Calcium: 8.2 mg/dL — ABNORMAL LOW (ref 8.9–10.3)
GFR calc Af Amer: >60 mL/min (ref >60)
GFR calc non Af Amer: >60 mL/min (ref >60)
Glucose, Bld: 106 mg/dL — ABNORMAL HIGH (ref 65–99)
Potassium: 3.3 mmol/L — ABNORMAL LOW (ref 3.5–5.1)
SODIUM: 138 mmol/L (ref 135–145)

## 2018-05-27 LAB — CBC
HCT: 39.2 % — ABNORMAL LOW (ref 40.0–52.0)
Hemoglobin: 13.4 g/dL (ref 13.0–18.0)
MCH: 34.2 pg — ABNORMAL HIGH (ref 26.0–34.0)
MCHC: 34.2 g/dL (ref 32.0–36.0)
MCV: 99.8 fL (ref 80.0–100.0)
PLATELETS: 175 10*3/uL (ref 150–440)
RBC: 3.92 MIL/uL — ABNORMAL LOW (ref 4.40–5.90)
RDW: 12.7 % (ref 11.5–14.5)
WBC: 3.9 10*3/uL (ref 3.8–10.6)

## 2018-05-27 LAB — TROPONIN I: Troponin I: <0.03 ng/mL (ref <0.03)

## 2018-05-27 MED ORDER — HYDROCODONE-ACETAMINOPHEN 5-325 MG PO TABS: 1.0000 | ORAL_TABLET | ORAL | Status: DC | PRN

## 2018-05-27 MED ORDER — IBUPROFEN 200 MG PO TABS: 400.0000 mg | ORAL_TABLET | Freq: Four times a day (QID) | ORAL | Status: DC | PRN

## 2018-05-27 MED ORDER — MAGNESIUM HYDROXIDE 400 MG/5ML PO SUSP: 30.0000 mL | Freq: Every day | ORAL | Status: DC | PRN

## 2018-05-27 MED ORDER — LISINOPRIL 10 MG PO TABS
20.0000 mg | ORAL_TABLET | Freq: Every day | ORAL | Status: DC
  Administered 2018-05-27: 20 mg via ORAL
  Filled 2018-05-27: qty 2

## 2018-05-27 MED ORDER — EMTRICITABINE-TENOFOVIR AF 200-25 MG PO TABS: 1.0000 | ORAL_TABLET | Freq: Every day | ORAL | Status: DC

## 2018-05-27 MED ORDER — BICTEGRAVIR-EMTRICITAB-TENOFOV 50-200-25 MG PO TABS
1.0000 | ORAL_TABLET | Freq: Every day | ORAL | Status: DC
  Administered 2018-05-28 – 2018-06-03 (×7): 1 via ORAL
  Filled 2018-05-27 (×7): qty 1

## 2018-05-27 MED ORDER — DOCUSATE SODIUM 100 MG PO CAPS
100.0000 mg | ORAL_CAPSULE | Freq: Two times a day (BID) | ORAL | Status: DC
  Administered 2018-05-27 – 2018-06-03 (×14): 100 mg via ORAL
  Filled 2018-05-27 (×14): qty 1

## 2018-05-27 MED ORDER — TRAZODONE HCL 100 MG PO TABS
150.0000 mg | ORAL_TABLET | Freq: Every day | ORAL | Status: DC
  Administered 2018-05-27 – 2018-05-28 (×2): 150 mg via ORAL
  Filled 2018-05-27 (×2): qty 1

## 2018-05-27 MED ORDER — HEPARIN SODIUM (PORCINE) 5000 UNIT/ML IJ SOLN
5000.0000 [IU] | Freq: Three times a day (TID) | INTRAMUSCULAR | Status: DC
  Administered 2018-05-27: 5000 [IU] via SUBCUTANEOUS
  Filled 2018-05-27 (×2): qty 1

## 2018-05-27 MED ORDER — POLYETHYLENE GLYCOL 3350 17 G PO PACK: 17.0000 g | PACK | Freq: Every day | ORAL | Status: DC

## 2018-05-27 MED ORDER — AMLODIPINE BESYLATE 5 MG PO TABS
5.0000 mg | ORAL_TABLET | Freq: Every day | ORAL | Status: DC
  Administered 2018-05-27: 5 mg via ORAL
  Filled 2018-05-27: qty 1

## 2018-05-27 MED ORDER — ONDANSETRON HCL 4 MG PO TABS: 4.0000 mg | ORAL_TABLET | Freq: Four times a day (QID) | ORAL | Status: DC | PRN

## 2018-05-27 MED ORDER — ALUM & MAG HYDROXIDE-SIMETH 200-200-20 MG/5ML PO SUSP: 30.0000 mL | ORAL | Status: DC | PRN

## 2018-05-27 MED ORDER — ONDANSETRON HCL 4 MG/2ML IJ SOLN: 4.0000 mg | Freq: Four times a day (QID) | INTRAMUSCULAR | Status: DC | PRN

## 2018-05-27 MED ORDER — VITAMIN D (ERGOCALCIFEROL) 1.25 MG (50000 UNIT) PO CAPS
50000.0000 [IU] | ORAL_CAPSULE | ORAL | Status: DC
  Administered 2018-06-02: 50000 [IU] via ORAL
  Filled 2018-05-27: qty 1

## 2018-05-27 MED ORDER — ACETAMINOPHEN 325 MG PO TABS
650.0000 mg | ORAL_TABLET | Freq: Four times a day (QID) | ORAL | Status: DC | PRN
  Administered 2018-05-29: 650 mg via ORAL
  Filled 2018-05-27: qty 2

## 2018-05-27 MED ORDER — ASPIRIN EC 81 MG PO TBEC
81.0000 mg | DELAYED_RELEASE_TABLET | Freq: Every day | ORAL | Status: DC
  Administered 2018-05-28 – 2018-06-03 (×7): 81 mg via ORAL
  Filled 2018-05-27 (×7): qty 1

## 2018-05-27 MED ORDER — BISACODYL 5 MG PO TBEC
5.0000 mg | DELAYED_RELEASE_TABLET | Freq: Every day | ORAL | Status: DC | PRN
  Filled 2018-05-27: qty 1

## 2018-05-27 MED ORDER — SODIUM CHLORIDE 0.9 % IV SOLN
INTRAVENOUS | Status: DC
  Administered 2018-05-27: 02:00:00 via INTRAVENOUS

## 2018-05-27 MED ORDER — NICOTINE 21 MG/24HR TD PT24
21.0000 mg | MEDICATED_PATCH | Freq: Every day | TRANSDERMAL | Status: DC
  Filled 2018-05-27 (×3): qty 1

## 2018-05-27 MED ORDER — ACETAMINOPHEN 650 MG RE SUPP: 650.0000 mg | Freq: Four times a day (QID) | RECTAL | Status: DC | PRN

## 2018-05-27 MED ORDER — GABAPENTIN 300 MG PO CAPS
300.0000 mg | ORAL_CAPSULE | Freq: Three times a day (TID) | ORAL | Status: DC
  Administered 2018-05-27 – 2018-05-30 (×9): 300 mg via ORAL
  Filled 2018-05-27 (×11): qty 1

## 2018-05-27 MED ORDER — ERGOCALCIFEROL 1.25 MG (50000 UT) PO CAPS
50000.0000 [IU] | ORAL_CAPSULE | ORAL | Status: DC
  Filled 2018-05-27: qty 1

## 2018-05-27 MED ORDER — BICTEGRAVIR-EMTRICITAB-TENOFOV 50-200-25 MG PO TABS
1.0000 | ORAL_TABLET | Freq: Every day | ORAL | Status: DC
  Administered 2018-05-27: 1 via ORAL
  Filled 2018-05-27: qty 1

## 2018-05-27 MED ORDER — ACETAMINOPHEN 325 MG PO TABS: 650.0000 mg | ORAL_TABLET | Freq: Four times a day (QID) | ORAL | Status: DC | PRN

## 2018-05-27 MED ORDER — BISACODYL 5 MG PO TBEC: 5.0000 mg | DELAYED_RELEASE_TABLET | Freq: Every day | ORAL | Status: DC | PRN

## 2018-05-27 MED ORDER — ENSURE ENLIVE PO LIQD
237.0000 mL | Freq: Two times a day (BID) | ORAL | Status: DC
  Administered 2018-05-28 – 2018-06-03 (×13): 237 mL via ORAL

## 2018-05-27 MED ORDER — ALBUTEROL SULFATE (2.5 MG/3ML) 0.083% IN NEBU: 3.0000 mL | INHALATION_SOLUTION | RESPIRATORY_TRACT | Status: DC | PRN

## 2018-05-27 MED ORDER — ATORVASTATIN CALCIUM 20 MG PO TABS
40.0000 mg | ORAL_TABLET | Freq: Every day | ORAL | Status: DC
  Administered 2018-05-27 – 2018-06-02 (×7): 40 mg via ORAL
  Filled 2018-05-27 (×7): qty 2

## 2018-05-27 MED ORDER — POTASSIUM CHLORIDE CRYS ER 20 MEQ PO TBCR
40.0000 meq | EXTENDED_RELEASE_TABLET | Freq: Once | ORAL | Status: AC
  Administered 2018-05-27: 40 meq via ORAL
  Filled 2018-05-27: qty 2

## 2018-05-27 MED ORDER — ENSURE ENLIVE PO LIQD
237.0000 mL | Freq: Two times a day (BID) | ORAL | Status: DC
  Administered 2018-05-27: 237 mL via ORAL

## 2018-05-27 MED ORDER — CITALOPRAM HYDROBROMIDE 20 MG PO TABS
20.0000 mg | ORAL_TABLET | Freq: Every day | ORAL | Status: DC
  Administered 2018-05-28 – 2018-06-03 (×7): 20 mg via ORAL
  Filled 2018-05-27 (×7): qty 1

## 2018-05-27 MED ORDER — GABAPENTIN 300 MG PO CAPS
300.0000 mg | ORAL_CAPSULE | Freq: Three times a day (TID) | ORAL | Status: DC
  Administered 2018-05-27 (×2): 300 mg via ORAL
  Filled 2018-05-27 (×2): qty 1

## 2018-05-27 MED ORDER — IBUPROFEN 400 MG PO TABS: 400.0000 mg | ORAL_TABLET | Freq: Four times a day (QID) | ORAL | Status: DC | PRN

## 2018-05-27 MED ORDER — DOCUSATE SODIUM 100 MG PO CAPS
100.0000 mg | ORAL_CAPSULE | Freq: Two times a day (BID) | ORAL | Status: DC
  Administered 2018-05-27: 100 mg via ORAL
  Filled 2018-05-27: qty 1

## 2018-05-27 MED ORDER — ATORVASTATIN CALCIUM 20 MG PO TABS: 40.0000 mg | ORAL_TABLET | Freq: Every day | ORAL | Status: DC

## 2018-05-27 MED ORDER — HYDROCODONE-ACETAMINOPHEN 5-325 MG PO TABS
1.0000 | ORAL_TABLET | Freq: Four times a day (QID) | ORAL | Status: DC | PRN
  Administered 2018-05-27 (×2): 1 via ORAL
  Filled 2018-05-27 (×2): qty 1

## 2018-05-27 MED ORDER — CITALOPRAM HYDROBROMIDE 20 MG PO TABS
20.0000 mg | ORAL_TABLET | Freq: Every day | ORAL | Status: DC
  Administered 2018-05-27: 20 mg via ORAL
  Filled 2018-05-27: qty 1

## 2018-05-27 MED ORDER — AMLODIPINE BESYLATE 5 MG PO TABS
5.0000 mg | ORAL_TABLET | Freq: Every day | ORAL | Status: DC
  Administered 2018-05-28 – 2018-06-03 (×7): 5 mg via ORAL
  Filled 2018-05-27 (×7): qty 1

## 2018-05-27 MED ORDER — ASPIRIN EC 81 MG PO TBEC
81.0000 mg | DELAYED_RELEASE_TABLET | Freq: Every day | ORAL | Status: DC
  Administered 2018-05-27: 81 mg via ORAL
  Filled 2018-05-27: qty 1

## 2018-05-27 MED ORDER — POLYETHYLENE GLYCOL 3350 17 G PO PACK
17.0000 g | PACK | Freq: Every day | ORAL | Status: DC
  Administered 2018-05-28 – 2018-06-02 (×5): 17 g via ORAL
  Filled 2018-05-27 (×6): qty 1

## 2018-05-27 MED ORDER — DOCUSATE SODIUM 100 MG PO CAPS: 100.0000 mg | ORAL_CAPSULE | Freq: Two times a day (BID) | ORAL | Status: DC

## 2018-05-27 MED ORDER — TRAZODONE HCL 100 MG PO TABS
150.0000 mg | ORAL_TABLET | Freq: Every day | ORAL | Status: DC
  Administered 2018-05-27: 150 mg via ORAL
  Filled 2018-05-27: qty 2

## 2018-05-27 MED ORDER — ADULT MULTIVITAMIN W/MINERALS CH
1.0000 | ORAL_TABLET | Freq: Every day | ORAL | Status: DC
  Administered 2018-05-27: 1 via ORAL
  Filled 2018-05-27: qty 1

## 2018-05-27 MED ORDER — LISINOPRIL 20 MG PO TABS
20.0000 mg | ORAL_TABLET | Freq: Every day | ORAL | Status: DC
  Administered 2018-05-28 – 2018-06-03 (×7): 20 mg via ORAL
  Filled 2018-05-27 (×7): qty 1

## 2018-05-27 MED ORDER — DOLUTEGRAVIR SODIUM 50 MG PO TABS: 50.0000 mg | ORAL_TABLET | Freq: Every day | ORAL | Status: DC

## 2018-05-27 MED ORDER — ADULT MULTIVITAMIN W/MINERALS CH
1.0000 | ORAL_TABLET | Freq: Every day | ORAL | Status: DC
  Administered 2018-05-28 – 2018-06-02 (×7): 1 via ORAL
  Filled 2018-05-27 (×7): qty 1

## 2018-05-27 NOTE — Plan of Care (Addendum)
Patient found in bed sleeping upon my arrival. Neither visible nor social this evening. Patient appearance is dirty and malodorous. Attempted to have the patient bathe but was unsuccessful. Patient is depressed and anxious, affect congruent. Denies SI at this time. Denies HI/AVH. Denies pain. Reports eating and voiding adequately but did not eat snack. Patient is minimally verbal. Patient has no scheduled HS medications. Q 15 minute checks maintained. Will continue to monitor throughout the shift. Patient slept 7 hours. No apparent distress. Woke at 0530 requests Afrin. Encouraged him to speak with MD. Refused nicotine patch. Will endorse care to oncoming shift.  Problem: Education: Goal: Knowledge of  General Education information/materials will improve Outcome: Not Progressing Goal: Mental status will improve Outcome: Not Progressing Goal: Verbalization of understanding the information provided will improve Outcome: Not Progressing   Problem: Coping: Goal: Ability to verbalize frustrations and anger appropriately will improve Outcome: Not Progressing   Problem: Health Behavior/Discharge Planning: Goal: Identification of resources available to assist in meeting health care needs will improve Outcome: Not Progressing   Problem: Medication: Goal: Compliance with prescribed medication regimen will improve Outcome: Not Progressing   Problem: Self-Concept: Goal: Ability to disclose and discuss suicidal ideas will improve Outcome: Not Progressing Goal: Will verbalize positive feelings about self Outcome: Not Progressing
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
## 2018-05-27 NOTE — Plan of Care (Signed)
  Problem: Education: Goal: Knowledge of General Education information will improve Outcome: Progressing   Problem: Clinical Measurements: Goal: Cardiovascular complication will be avoided Outcome: Progressing   Problem: Pain Managment: Goal: General experience of comfort will improve Outcome: Progressing   Problem: Spiritual Needs Goal: Ability to function at adequate level Outcome: Progressing

## 2018-05-27 NOTE — Progress Notes (Addendum)
Initial Nutrition Assessment  DOCUMENTATION CODES:   Not applicable  INTERVENTION:   Ensure Enlive po BID, each supplement provides 350 kcal and 20 grams of protein  MVI daily  Liberalize diet   Pt likely at moderate refeeding risk; recommend monitor K, Mg, and P labs.   NUTRITION DIAGNOSIS:   Unintentional weight loss related to social / environmental circumstances(homelessness, drug abuse ) as evidenced by per patient/family report, 9% percent weight loss in 2 months.  GOAL:   Patient will meet greater than or equal to 90% of their needs  MONITOR:   PO intake, Supplement acceptance, Labs, Weight trends, I & O's  REASON FOR ASSESSMENT:   Malnutrition Screening Tool    ASSESSMENT:   52 y.o. male with a known history of AIDS, bipolar disorder, depression, hepatitis C, HIV, homeless, hypertension admitted with chest pain and SI   Met with pt in room today. Pt reports good appetite and oral intake at baseline but reports that he has recently become homeless and has difficulty getting assess to food. Pt reports that the majority of his meals come from individuals  where he trades food for services such as mowing the grass. Pt does not get three meals a day and often only eats a sandwich the whole day. Pt reports that at one point, he was staying in a homeless shelter but he was asked to leave after 30 days. Per chart, pt has lost 20lbs(9%) over the past two months; this is significant given the time frame. Pt requesting regular diet today at time of RD visit. Pt also would like to have Ensure. RD will order supplements and MVI to help pt meet his estimated needs. Pt eating 75% of meals. Pt likely at moderate refeeding risk; recommend monitor K, Mg, and P labs. Pt to transfer to [facility].    Medications reviewed and include: aspirin, celexa, colace, vitamin D2, heparin, MVI, miralax, KCl, trazodone  Labs reviewed: K 3.3(L), BUN 21(H)  NUTRITION - FOCUSED PHYSICAL EXAM:    Most  Recent Value  Orbital Region  No depletion  Upper Arm Region  No depletion  Thoracic and Lumbar Region  No depletion  Buccal Region  No depletion  Temple Region  No depletion  Clavicle Bone Region  No depletion  Clavicle and Acromion Bone Region  No depletion  Scapular Bone Region  No depletion  Dorsal Hand  No depletion  Patellar Region  No depletion  Anterior Thigh Region  No depletion  Posterior Calf Region  No depletion  Edema (RD Assessment)  None  Hair  Reviewed  Eyes  Reviewed  Mouth  Reviewed  Skin  Reviewed  Nails  Reviewed     Diet Order:   Diet Order           Diet regular Room service appropriate? Yes; Fluid consistency: Thin  Diet effective now         EDUCATION NEEDS:   Education needs have been addressed  Skin:  Skin Assessment: Reviewed RN Assessment  Last BM:  6/10  Height:   Ht Readings from Last 1 Encounters:  05/27/18 '5\' 9"'$  (1.753 m)    Weight:   Wt Readings from Last 1 Encounters:  05/27/18 203 lb 8 oz (92.3 kg)    Ideal Body Weight:  72.7 kg  BMI:  Body mass index is 30.05 kg/m.  Estimated Nutritional Needs:   Kcal:  1800-2100kcal/day   Protein:  93-112g/day   Fluid:  >2L/day   Koleen Distance MS, RD,  LDN Pager #- [phone] Office#- [phone] After Hours Pager: [phone]

## 2018-05-27 NOTE — Tx Team (Signed)
Initial Treatment Plan 05/27/2018 5:10 PM Park Liter Trussell Sr. BJS:[id number]    PATIENT STRESSORS: Financial difficulties Health problems Legal issue Loss of mother and aunt in the past year Medication change or noncompliance Other: unable to obtain his medictions   PATIENT STRENGTHS: Ability for insight Average or above average intelligence Capable of independent living Communication skills Motivation for treatment/growth   PATIENT IDENTIFIED PROBLEMS: homeless  Financially  Medicaid  No transportation  Arthritis  HIV+           DISCHARGE CRITERIA:  Ability to meet basic life and health needs Adequate post-discharge living arrangements Improved stabilization in mood, thinking, and/or behavior  PRELIMINARY DISCHARGE PLAN: Placement in alternative living arrangements  PATIENT/FAMILY INVOLVEMENT: This treatment plan has been presented to and reviewed with the patient, Lanney Carrithers Sr.  The patient has been given the opportunity to ask questions and make suggestions.  Rex Kras, RN 05/27/2018, 5:10 PM

## 2018-05-27 NOTE — Progress Notes (Signed)
   05/27/18 1046  Clinical Encounter Type  Visited With Patient  Visit Type Spiritual support  Referral From Nurse  Consult/Referral To Chaplain  Spiritual Encounters  Spiritual Needs Prayer;Emotional  Stress Factors  Patient Stress Factors Family relationships;Financial concerns;Major life changes  Family Stress Factors Family relationships;Health changes;Loss of control;Major life changes  [facility] received an order requisition for Roy [name] for pastoral care. He is depressed and endorsing (passive) SI. He was recently released from jail April this year and has had several set backs to include lack of family and social support, lack of finances and his continued use of cocaine to quell his ongoing emotional discord.I and Judeth Cornfield offered ministry pf presence, provided uplift and hope, via incarnational presence, Judeth Cornfield and I offered closed with prayer.
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
## 2018-05-27 NOTE — ED Notes (Signed)
Pt denies wanting to harm himself at this time. Pt states "no" when asked if he wants to harm himself at this time.
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
## 2018-05-27 NOTE — Consult Note (Signed)
We will admit to psychiatry when medically cleared. Admission orders entered.

## 2018-05-27 NOTE — ED Notes (Signed)
TTS in rm at this time

## 2018-05-27 NOTE — Clinical Social Work Note (Signed)
CSW was informed that patient will be transferring to [place], CSW signing off.  Ervin Knack. Meleane Selinger, MSW, Theresia Majors [phone]  05/27/2018 2:55 PM

## 2018-05-27 NOTE — Progress Notes (Signed)
Pt arrived from ED alert and oriented. No c/o chest pain greater than 3/10. Pt states he had thoughts of killing himself earlier but he had no intention of doing it at this time. No concerns offered at this time.

## 2018-05-27 NOTE — BH Assessment (Signed)
Patient is to be admitted to [facility] by Dr. Jennet Maduro.  Attending Physician will be Dr. Jennet Maduro.   Patient has been assigned to room 304, by [facility] Charge Nurse [place] F.

## 2018-05-27 NOTE — Progress Notes (Signed)
Patient stable and ruled out for MI. Needs transfer to [facility].  Can be transferred when bed available.

## 2018-05-27 NOTE — Tx Team (Signed)
Received Roy [name] from the ED, alert and oriented x4. He was cooperative with the admission process. He verbalized this admission is related to SI since April  4th 2019. He hasn't acted on the thoughts, but thought about taking an over dose of his HIV medications. He listed many stressors and health deviations. He has loss 20 lbs in the past month and homeless . He is  currently on probation. He feels anxious, depressed, passive SI and previous voices. He feels he can be safe here on the unit because he is getting help. The skin assessment was completed with nurse Isaiah Blakes. He was oriented to his new environment and dinner was ordered. He was placed on moderated falls precautions related to arthritis in his hip and a previous fall in the p[ast 30 days.

## 2018-05-27 NOTE — Progress Notes (Signed)
Pt discharged to behavorial health unit this afternoon, report given to RN in [facility]. Pt is AxOx4, VSS, no complaints of pain, and cardiac markers negative. He is cooperative and calm. No complaints at this time

## 2018-05-28 LAB — HELPER T-LYMPH-CD4 (ARMC ONLY)
% CD 4 Pos. Lymph.: 27.9 % — ABNORMAL LOW (ref 30.8–58.5)
Absolute CD 4 Helper: 698 /uL (ref 359–1519)
BASOS ABS: 0 10*3/uL (ref 0.0–0.2)
BASOS: 1 %
EOS (ABSOLUTE): 0.1 10*3/uL (ref 0.0–0.4)
EOS: 2 %
HEMOGLOBIN: 13.2 g/dL (ref 13.0–17.7)
Hematocrit: 39.9 % (ref 37.5–51.0)
Immature Grans (Abs): 0 10*3/uL (ref 0.0–0.1)
Immature Granulocytes: 0 %
LYMPHS ABS: 2.5 10*3/uL (ref 0.7–3.1)
Lymphs: 57 %
MCH: 32.8 pg (ref 26.6–33.0)
MCHC: 33.1 g/dL (ref 31.5–35.7)
MCV: 99 fL — AB (ref 79–97)
Monocytes Absolute: 0.4 10*3/uL (ref 0.1–0.9)
Monocytes: 9 %
Neutrophils Absolute: 1.3 10*3/uL — ABNORMAL LOW (ref 1.4–7.0)
Neutrophils: 31 %
PLATELETS: 202 10*3/uL (ref 150–450)
RBC: 4.03 x10E6/uL — AB (ref 4.14–5.80)
RDW: 13.2 % (ref 12.3–15.4)
WBC: 4.3 10*3/uL (ref 3.4–10.8)

## 2018-05-28 LAB — LIPID PANEL
CHOL/HDL RATIO: 3 ratio
CHOLESTEROL: 121 mg/dL (ref 0–200)
HDL: 40 mg/dL — AB (ref >40)
LDL Cholesterol: 67 mg/dL (ref 0–99)
TRIGLYCERIDES: 70 mg/dL (ref <150)
VLDL: 14 mg/dL (ref 0–40)

## 2018-05-28 LAB — TSH: TSH: 0.732 u[IU]/mL (ref 0.350–4.500)

## 2018-05-28 LAB — HEMOGLOBIN A1C
Hgb A1c MFr Bld: 5.2 % (ref 4.8–5.6)
Mean Plasma Glucose: 102.54 mg/dL

## 2018-05-28 MED ORDER — DICLOFENAC SODIUM 75 MG PO TBEC
75.0000 mg | DELAYED_RELEASE_TABLET | Freq: Two times a day (BID) | ORAL | Status: DC
  Administered 2018-05-28 – 2018-06-03 (×12): 75 mg via ORAL
  Filled 2018-05-28 (×14): qty 1

## 2018-05-28 NOTE — BHH Group Notes (Signed)
LCSW Group Therapy Note  05/28/2018 1:00 pm  Type of Therapy/Topic:  Group Therapy:  Emotion Regulation  Participation Level:  Did Not Attend   Description of Group:    The purpose of this group is to assist patients in learning to regulate negative emotions and experience positive emotions. Patients will be guided to discuss ways in which they have been vulnerable to their negative emotions. These vulnerabilities will be juxtaposed with experiences of positive emotions or situations, and patients will be challenged to use positive emotions to combat negative ones. Special emphasis will be placed on coping with negative emotions in conflict situations, and patients will process healthy conflict resolution skills.  Therapeutic Goals: 1. Patient will identify two positive emotions or experiences to reflect on in order to balance out negative emotions 2. Patient will label two or more emotions that they find the most difficult to experience 3. Patient will demonstrate positive conflict resolution skills through discussion and/or role plays  Summary of Patient Progress:  Roy [name] was invited to today's group, but chose not to attend.     Therapeutic Modalities:   Cognitive Behavioral Therapy Feelings Identification Dialectical Behavioral Therapy

## 2018-05-28 NOTE — Progress Notes (Signed)
Recreation Therapy Notes  Date: 05/28/2018  Time: 9:30 am   Location: Craft Room   Behavioral response: N/A   Intervention Topic:  Communication  Discussion/Intervention: Patient did not attend group.   Clinical Observations/Feedback:  Patient did not attend group.   Aiya Keach LRT/CTRS        Lynard Postlewait 05/28/2018 11:22 AM

## 2018-05-28 NOTE — Progress Notes (Signed)
Recreation Therapy Notes  INPATIENT RECREATION THERAPY ASSESSMENT  Patient Details Name: Roy [name] [facility]. MRN: [id number] DOB: June 29, 1966 Today's Date: 05/28/2018       Information Obtained From: Patient  Able to Participate in Assessment/Interview: Yes  Patient Presentation: Responsive  Reason for Admission (Per Patient): Med Non-Compliance, Other (Comments)(Chest pains)  Patient Stressors:    Coping Skills:   Substance Abuse  Leisure Interests (2+):  Art - Draw([place], Walk the beach)  Frequency of Recreation/Participation: Monthly  Awareness of Community Resources:  Yes  Community Resources:  [employer]  Current Use: Yes  If no, Barriers?:    Expressed Interest in [employer] Information:    [place] of Residence:  Film/video editor  Patient Main Form of Transportation: Walk  Patient Strengths:  I do not know  Patient Identified Areas of Improvement:  Get disability and medicaid back  Patient Goal for Hospitalization:  Try to find a place to stay  Current SI (including self-harm):  No  Current HI:  No  Current AVH: No  Staff Intervention Plan: Group Attendance, Collaborate with Interdisciplinary Treatment Team  Consent to Intern Participation: N/A  Roy [name] 05/28/2018, 3:45 PM

## 2018-05-28 NOTE — [facility] Suicide Risk Assessment (Signed)
[facility] Admission Suicide Risk Assessment   Nursing information obtained from:  Patient Demographic factors:  Male, Divorced or widowed, Living alone, Unemployed, Low socioeconomic status Current Mental Status:  Suicidal ideation indicated by patient, Self-harm thoughts, Suicide plan Loss Factors:  Legal issues, Decline in physical health, Financial problems / change in socioeconomic status Historical Factors:  Prior suicide attempts, Anniversary of important loss Risk Reduction Factors:  Positive coping skills or problem solving skills  Total Time spent with patient: 1 hour Principal Problem: Major depressive disorder, recurrent severe without psychotic features (HCC) Diagnosis:   Patient Active Problem List   Diagnosis Date Noted  . Major depressive disorder, recurrent severe without psychotic features (HCC) [F33.2] 05/27/2018    Priority: High  . Chest pain [R07.9] 05/27/2018  . ARF (acute renal failure) (HCC) [N17.9] 04/08/2018  . Cocaine use disorder, moderate, dependence (HCC) [F14.20] 02/19/2017  . Substance induced mood disorder (HCC) [F19.94] 02/19/2017  . HIV disease (HCC) [B20] 10/26/2016  . HTN (hypertension) [I10] 10/26/2016  . Dyslipidemia [E78.5] 10/26/2016  . BPH (benign prostatic hyperplasia) [N40.0] 10/26/2016  . Constipation [K59.00] 10/26/2016   Subjective Data: suicidal ideation  Continued Clinical Symptoms:  Alcohol Use Disorder Identification Test Final Score (AUDIT): 2 The "Alcohol Use Disorders Identification Test", Guidelines for Use in Primary Care, Second Edition.  World Science writer [facility]). Score between 0-7:  no or low risk or alcohol related problems. Score between 8-15:  moderate risk of alcohol related problems. Score between 16-19:  high risk of alcohol related problems. Score 20 or above:  warrants further diagnostic evaluation for alcohol dependence and treatment.   CLINICAL FACTORS:   Depression:   Comorbid alcohol  abuse/dependence Impulsivity Insomnia Alcohol/Substance Abuse/Dependencies Previous Psychiatric Diagnoses and Treatments Medical Diagnoses and Treatments/Surgeries   Musculoskeletal: Strength & Muscle Tone: within normal limits Gait & Station: normal Patient leans: N/A  Psychiatric Specialty Exam: Physical Exam  Nursing note and vitals reviewed. Psychiatric: His speech is normal. His affect is blunt. He is slowed and withdrawn. Cognition and memory are normal. He expresses impulsivity. He exhibits a depressed mood. He expresses suicidal ideation. He expresses suicidal plans.    Review of Systems  Neurological: Negative.   Psychiatric/Behavioral: Positive for depression, substance abuse and suicidal ideas. The patient has insomnia.   All other systems reviewed and are negative.   Blood pressure (!) 130/99, pulse (!) 58, temperature 98.2 F (36.8 C), temperature source Oral, resp. rate 18, height 5\' 9"  (1.753 m), weight 96.6 kg (213 lb), SpO2 97 %.Body mass index is 31.45 kg/m.  See SRA                                                  Sleep:  Number of Hours: 7      COGNITIVE FEATURES THAT CONTRIBUTE TO RISK:  None    SUICIDE RISK:   Moderate:  Frequent suicidal ideation with limited intensity, and duration, some specificity in terms of plans, no associated intent, good self-control, limited dysphoria/symptomatology, some risk factors present, and identifiable protective factors, including available and accessible social support.  PLAN OF CARE: hospital admission, medication management, substance abuse counseling, discharge planning.  Roy [name] is a 52 year old male with a history of depression and substance abuse transferred from medical floor, where he was hospitalized for chest pain, in the context of severe social stressors and relapse on  cocaine.   #Suicidal ideation -patient able to contract for safety in the hospital  #Mood -continue  Celexa 20 mg daily -continue Trazodone 100 mg nightly  #Substance abuse -positive for cocaine and cannabis -unable to participate in rehab due to legal situation  #HIV -Biktarvy daily  #HTN -continue Norvasc 5 mg and Lisinopril 20 mg  #Dyslipidemia -continue ASA 81 mg and Lipitor 40 mg   #Weight loss -Ensure TID and MVI  #Constipation -continue bowel regimen  #Arthritis -Neurontin 300 mg TID -start Voltaren 75 mg BID  #Smoking cessation -nicotine patch is available  #Disposition -homeless -follow up with [facility] and [facility]    I certify that inpatient services furnished can reasonably be expected to improve the patient's condition.   Kristine Linea, MD 05/28/2018, 9:42 AM

## 2018-05-28 NOTE — Plan of Care (Addendum)
Patient found asleep in bed upon my arrival. Patient is visible but not social this evening. Continues to report depression but affect is brighter and more animated. Patient remains on the periphery of milieu. Denies SI/HI/AVH. Reports he feels safe here. Reports eating and voiding adequately. Compliant with HS medications and staff direction. Q 15 minute checks maintained. Will continue to monitor throughout the shift. Patient slept 6.25 hours. No apparent distress. Will endorse care to oncoming shift. Refused nicotine patch.   Problem: Education: Goal: Mental status will improve Outcome: Progressing Goal: Verbalization of understanding the information provided will improve Outcome: Progressing   Problem: Coping: Goal: Ability to verbalize frustrations and anger appropriately will improve Outcome: Progressing   Problem: Medication: Goal: Compliance with prescribed medication regimen will improve Outcome: Progressing   Problem: Self-Concept: Goal: Ability to disclose and discuss suicidal ideas will improve Outcome: Progressing

## 2018-05-28 NOTE — Progress Notes (Signed)
Initial Nutrition Assessment  DOCUMENTATION CODES:   Not applicable  INTERVENTION:   Ensure Enlive po BID, each supplement provides 350 kcal and 20 grams of protein  MVI daily  NUTRITION DIAGNOSIS:   Unintentional weight loss related to social / environmental circumstances(homelessness, drug abuse ) as evidenced by per patient/family report, 9% percent weight loss in 2 months.  GOAL:   Patient will meet greater than or equal to 90% of their needs  MONITOR:   PO intake, Supplement acceptance  REASON FOR ASSESSMENT:   Malnutrition Screening Tool    ASSESSMENT:   52 y.o. male with a known history of AIDS, bipolar disorder, depression, hepatitis C, HIV, homeless, hypertension admitted with chest pain and SI   Met with pt in room 6/11. Pt reports good appetite and oral intake at baseline but reports that he has recently become homeless and has difficulty getting assess to food. Pt reports that the majority of his meals come from individuals  where he trades food for services such as mowing the grass. Pt does not get three meals a day and often only eats a sandwich the whole day. Pt reports that at one point, he was staying in a homeless shelter but he was asked to leave after 30 days. Per chart, pt has lost 20lbs(9%) over the past two months; this is significant given the time frame. Pt would like to have Ensure. RD will order supplements and MVI to help pt meet his estimated needs. Pt currently eating 75-100% of meals.  Medications reviewed and include: aspirin, celexa, colace, nicotine, vitamin D2, MVI, miralax, trazodone  Labs reviewed: K 3.3(L), BUN 21(H)- 6/11  NUTRITION - FOCUSED PHYSICAL EXAM:    Most Recent Value  Orbital Region  No depletion  Upper Arm Region  No depletion  Thoracic and Lumbar Region  No depletion  Buccal Region  No depletion  Temple Region  No depletion  Clavicle Bone Region  No depletion  Clavicle and Acromion Bone Region  No depletion  Scapular  Bone Region  No depletion  Dorsal Hand  No depletion  Patellar Region  No depletion  Anterior Thigh Region  No depletion  Posterior Calf Region  No depletion  Edema (RD Assessment)  None  Hair  Reviewed  Eyes  Reviewed  Mouth  Reviewed  Skin  Reviewed  Nails  Reviewed     Diet Order:   Diet Order           Diet regular Room service appropriate? Yes; Fluid consistency: Thin  Diet effective now         EDUCATION NEEDS:   Education needs have been addressed  Skin:  Skin Assessment: Reviewed RN Assessment  Last BM:  6/10  Height:   Ht Readings from Last 1 Encounters:  05/27/18 '5\' 9"'$  (1.753 m)    Weight:   Wt Readings from Last 1 Encounters:  05/27/18 213 lb (96.6 kg)    Ideal Body Weight:  72.7 kg  BMI:  Body mass index is 31.45 kg/m.  Estimated Nutritional Needs:   Kcal:  1800-2100kcal/day   Protein:  93-112g/day   Fluid:  >2L/day   Koleen Distance MS, RD, LDN Pager #- [phone] Office#- [phone] After Hours Pager: [phone]

## 2018-05-28 NOTE — [facility] Group Notes (Signed)
[facility] Group Notes:  (Nursing/MHT/Case Management/Adjunct)  Date:  05/28/2018  Time:  9:31 PM  Type of Therapy:  Group Therapy  Participation Level:  Active  Participation Quality:  Appropriate  Affect:  Appropriate  Cognitive:  Appropriate  Insight:  Appropriate  Engagement in Group:  Engaged  Modes of Intervention:  Discussion  Summary of Progress/Problems:  Burt Ek 05/28/2018, 9:31 PM

## 2018-05-28 NOTE — Tx Team (Addendum)
Interdisciplinary Treatment and Diagnostic Plan Update  05/28/2018 Time of Session: 10:30 AM Park Liter Fairbank Sr. MRN: [id number]  Principal Diagnosis: Major depressive disorder, recurrent severe without psychotic features (HCC)  Secondary Diagnoses: Principal Problem:   Major depressive disorder, recurrent severe without psychotic features (HCC) Active Problems:   HIV disease (HCC)   HTN (hypertension)   Dyslipidemia   BPH (benign prostatic hyperplasia)   Constipation   Substance induced mood disorder (HCC)   Current Medications:  Current Facility-Administered Medications  Medication Dose Route Frequency Provider Last Rate Last Dose  . acetaminophen (TYLENOL) tablet 650 mg  650 mg Oral Q6H PRN Pucilowska, Jolanta B, MD      . albuterol (PROVENTIL) (2.5 MG/3ML) 0.083% nebulizer solution 3 mL  3 mL Inhalation Q4H PRN Pucilowska, Jolanta B, MD      . alum & mag hydroxide-simeth (MAALOX/MYLANTA) 200-200-20 MG/5ML suspension 30 mL  30 mL Oral Q4H PRN Pucilowska, Jolanta B, MD      . amLODipine (NORVASC) tablet 5 mg  5 mg Oral Daily Pucilowska, Jolanta B, MD   5 mg at 05/28/18 0800  . aspirin EC tablet 81 mg  81 mg Oral Daily Pucilowska, Jolanta B, MD   81 mg at 05/28/18 0800  . atorvastatin (LIPITOR) tablet 40 mg  40 mg Oral q1800 Pucilowska, Jolanta B, MD   40 mg at 05/27/18 1723  . bictegravir-emtricitabine-tenofovir AF (BIKTARVY) 50-200-25 MG per tablet 1 tablet  1 tablet Oral Daily Pucilowska, Jolanta B, MD   1 tablet at 05/28/18 0802  . bisacodyl (DULCOLAX) EC tablet 5 mg  5 mg Oral Daily PRN Pucilowska, Jolanta B, MD      . citalopram (CELEXA) tablet 20 mg  20 mg Oral Daily Pucilowska, Jolanta B, MD   20 mg at 05/28/18 0800  . docusate sodium (COLACE) capsule 100 mg  100 mg Oral BID Pucilowska, Jolanta B, MD   100 mg at 05/28/18 0800  . feeding supplement (ENSURE ENLIVE) (ENSURE ENLIVE) liquid 237 mL  237 mL Oral BID BM Pucilowska, Jolanta B, MD   237 mL at 05/28/18 1048  .  gabapentin (NEURONTIN) capsule 300 mg  300 mg Oral TID Pucilowska, Jolanta B, MD   300 mg at 05/28/18 1147  . ibuprofen (ADVIL,MOTRIN) tablet 400 mg  400 mg Oral Q6H PRN Pucilowska, Jolanta B, MD      . lisinopril (PRINIVIL,ZESTRIL) tablet 20 mg  20 mg Oral Daily Pucilowska, Jolanta B, MD   20 mg at 05/28/18 0800  . magnesium hydroxide (MILK OF MAGNESIA) suspension 30 mL  30 mL Oral Daily PRN Pucilowska, Jolanta B, MD      . multivitamin with minerals tablet 1 tablet  1 tablet Oral Daily Pucilowska, Jolanta B, MD   1 tablet at 05/28/18 0800  . nicotine (NICODERM CQ - dosed in mg/24 hours) patch 21 mg  21 mg Transdermal Q0600 Pucilowska, Jolanta B, MD      . polyethylene glycol (MIRALAX / GLYCOLAX) packet 17 g  17 g Oral Daily Pucilowska, Jolanta B, MD   17 g at 05/28/18 0800  . traZODone (DESYREL) tablet 150 mg  150 mg Oral QHS Pucilowska, Jolanta B, MD   150 mg at 05/27/18 2158  . [START ON 06/03/2018] Vitamin D (Ergocalciferol) (DRISDOL) capsule 50,000 Units  50,000 Units Oral Weekly Pucilowska, Jolanta B, MD       PTA Medications: Medications Prior to Admission  Medication Sig Dispense Refill Last Dose  . acetaminophen (TYLENOL) 500 MG tablet Take 1  tablet (500 mg total) by mouth every 8 (eight) hours as needed. 90 tablet 1 prn at prn  . albuterol (PROVENTIL HFA;VENTOLIN HFA) 108 (90 Base) MCG/ACT inhaler Inhale 2 puffs into the lungs every 4 (four) hours as needed for shortness of breath. 1 Inhaler 1 prn at prn  . amLODipine (NORVASC) 5 MG tablet Take 1 tablet (5 mg total) by mouth daily. 30 tablet 1 05/25/2018 at Unknown time  . aspirin EC 81 MG tablet Take 1 tablet (81 mg total) by mouth daily. 30 tablet 1 05/25/2018 at Unknown time  . atorvastatin (LIPITOR) 40 MG tablet Take 1 tablet (40 mg total) by mouth daily at 6 PM. 30 tablet 1 Past Month at Unknown time  . bictegravir-emtricitabine-tenofovir AF (BIKTARVY) 50-200-25 MG TABS tablet Take 1 tablet by mouth daily.   05/26/2018 at Unknown time  .  citalopram (CELEXA) 20 MG tablet Take 1 tablet (20 mg total) by mouth daily. 30 tablet 1 05/25/2018 at Unknown time  . docusate sodium (COLACE) 100 MG capsule Take 1 capsule (100 mg total) by mouth 2 (two) times daily. (Patient not taking: Reported on 05/26/2018) 60 capsule 1 Not Taking at Unknown time  . dolutegravir (TIVICAY) 50 MG tablet Take 1 tablet (50 mg total) by mouth daily. (Patient not taking: Reported on 05/26/2018) 30 tablet 1 Not Taking at Unknown time  . emtricitabine-tenofovir AF (DESCOVY) 200-25 MG tablet Take 1 tablet by mouth daily. (Patient not taking: Reported on 05/26/2018) 30 tablet 1 Not Taking at Unknown time  . ergocalciferol (VITAMIN D2) 50000 units capsule Take 1 capsule (50,000 Units total) by mouth once a week. 4 capsule 1 Past Month at Unknown time  . gabapentin (NEURONTIN) 300 MG capsule Take 1 capsule (300 mg total) by mouth 3 (three) times daily. 90 capsule 1 Past Month at Unknown time  . lisinopril (PRINIVIL,ZESTRIL) 20 MG tablet Take 1 tablet (20 mg total) by mouth daily. 30 tablet 1 Past Month at Unknown time  . meloxicam (MOBIC) 7.5 MG tablet Take 1 tablet (7.5 mg total) by mouth 2 (two) times daily. (Patient not taking: Reported on 05/26/2018) 60 tablet 1 Not Taking at Unknown time  . oxymetazoline (AFRIN) 0.05 % nasal spray Place 1 spray into both nostrils 2 (two) times daily. 30 mL 1 05/25/2018 at Unknown time  . polyethylene glycol (MIRALAX / GLYCOLAX) packet Take 17 g by mouth daily. 14 each 1 Past Week at Unknown time  . traZODone (DESYREL) 150 MG tablet Take 1 tablet (150 mg total) by mouth at bedtime. 30 tablet 1 Past Month at Unknown time    Patient Stressors: Financial difficulties Health problems Legal issue Loss of mother and aunt in the past year Medication change or noncompliance Other: unable to obtain his medictions  Patient Strengths: Ability for insight Average or above average intelligence Capable of independent living Communication  skills Motivation for treatment/growth  Treatment Modalities: Medication Management, Group therapy, Case management,  1 to 1 session with clinician, Psychoeducation, Recreational therapy.   Physician Treatment Plan for Primary Diagnosis: Major depressive disorder, recurrent severe without psychotic features (HCC) Long Term Goal(s): Improvement in symptoms so as ready for discharge Improvement in symptoms so as ready for discharge   Short Term Goals: Ability to identify changes in lifestyle to reduce recurrence of condition will improve Ability to verbalize feelings will improve Ability to disclose and discuss suicidal ideas Ability to demonstrate self-control will improve Ability to identify and develop effective coping behaviors will improve Ability to maintain  clinical measurements within normal limits will improve Compliance with prescribed medications will improve Ability to identify triggers associated with substance abuse/mental health issues will improve Ability to identify changes in lifestyle to reduce recurrence of condition will improve Ability to demonstrate self-control will improve Ability to identify triggers associated with substance abuse/mental health issues will improve  Medication Management: Evaluate patient's response, side effects, and tolerance of medication regimen.  Therapeutic Interventions: 1 to 1 sessions, Unit Group sessions and Medication administration.  Evaluation of Outcomes: Progressing  Physician Treatment Plan for Secondary Diagnosis: Principal Problem:   Major depressive disorder, recurrent severe without psychotic features (HCC) Active Problems:   HIV disease (HCC)   HTN (hypertension)   Dyslipidemia   BPH (benign prostatic hyperplasia)   Constipation   Substance induced mood disorder (HCC)  Long Term Goal(s): Improvement in symptoms so as ready for discharge Improvement in symptoms so as ready for discharge   Short Term Goals: Ability  to identify changes in lifestyle to reduce recurrence of condition will improve Ability to verbalize feelings will improve Ability to disclose and discuss suicidal ideas Ability to demonstrate self-control will improve Ability to identify and develop effective coping behaviors will improve Ability to maintain clinical measurements within normal limits will improve Compliance with prescribed medications will improve Ability to identify triggers associated with substance abuse/mental health issues will improve Ability to identify changes in lifestyle to reduce recurrence of condition will improve Ability to demonstrate self-control will improve Ability to identify triggers associated with substance abuse/mental health issues will improve     Medication Management: Evaluate patient's response, side effects, and tolerance of medication regimen.  Therapeutic Interventions: 1 to 1 sessions, Unit Group sessions and Medication administration.  Evaluation of Outcomes: Progressing   RN Treatment Plan for Primary Diagnosis: Major depressive disorder, recurrent severe without psychotic features (HCC) Long Term Goal(s): Knowledge of disease and therapeutic regimen to maintain health will improve  Short Term Goals: Ability to remain free from injury will improve, Ability to disclose and discuss suicidal ideas, Ability to identify and develop effective coping behaviors will improve and Compliance with prescribed medications will improve  Medication Management: RN will administer medications as ordered by provider, will assess and evaluate patient's response and provide education to patient for prescribed medication. RN will report any adverse and/or side effects to prescribing provider.  Therapeutic Interventions: 1 on 1 counseling sessions, Psychoeducation, Medication administration, Evaluate responses to treatment, Monitor vital signs and CBGs as ordered, Perform/monitor CIWA, COWS, AIMS and Fall Risk  screenings as ordered, Perform wound care treatments as ordered.  Evaluation of Outcomes: Progressing   LCSW Treatment Plan for Primary Diagnosis: Major depressive disorder, recurrent severe without psychotic features (HCC) Long Term Goal(s): Safe transition to appropriate next level of care at discharge, Engage patient in therapeutic group addressing interpersonal concerns.  Short Term Goals: Engage patient in aftercare planning with referrals and resources and Increase skills for wellness and recovery  Therapeutic Interventions: Assess for all discharge needs, 1 to 1 time with Social worker, Explore available resources and support systems, Assess for adequacy in community support network, Educate family and significant other(s) on suicide prevention, Complete Psychosocial Assessment, Interpersonal group therapy.  Evaluation of Outcomes: Progressing   Progress in Treatment: Attending groups: No. Participating in groups: No. Taking medication as prescribed: Yes. Toleration medication: Yes. Family/Significant other contact made: No, will contact:  CSW given concent to contact pt's significant other, [name] Patient understands diagnosis: Yes. Discussing patient identified problems/goals with staff: Yes. Medical  problems stabilized or resolved: Yes. Denies suicidal/homicidal ideation: Yes. Issues/concerns per patient self-inventory: No. Other: n/a  New problem(s) identified: No, Describe:  No new problems identified  New Short Term/Long Term Goal(s):  Patient Goals:  Stop feeling like I want to die and my life is worthless.  I want to stop the storm"  Discharge Plan or Barriers: Barriers include pt being homeless and on parole.  He would like to go to the homeless shelter in [place], but unsure if he can have his parole transferred to this area.  Pt is also interested in residential substance use treatment.  Reason for Continuation of Hospitalization:  Anxiety Depression Medication stabilization  Estimated Length of Stay: 3-5 days  Recreational Therapy: Patient Stressors: N/A  Patient Goal: Patient will engage in groups without prompting or encouragement from LRT x3 group sessions within 5 recreation therapy group sessions  Attendees: Patient: Roy [name], Sr 05/28/2018 12:48 PM  Physician: Kristine Linea, MD 05/28/2018 12:48 PM  Nursing: Hulan Amato, RN 05/28/2018 12:48 PM  RN Care Manager: 05/28/2018 12:48 PM  Social Worker: Huey Romans, LCSW 05/28/2018 12:48 PM  Recreational Therapist: Garret Reddish, LRT 05/28/2018 12:48 PM  Other: Heidi Dach, LCSW 05/28/2018 12:48 PM  Other: Johny Shears, LCSWA 05/28/2018 12:48 PM  Other: Damian Leavell, Chaplain 05/28/2018 12:48 PM    Scribe for Treatment Team: Alease Frame, LCSW 05/28/2018 12:48 PM

## 2018-05-28 NOTE — H&P (Signed)
Psychiatric Admission Assessment Adult  Patient Identification: Ashaad Gaertner Krysiak Sr. MRN:  [id number] Date of Evaluation:  05/28/2018 Chief Complaint:  Depression Principal Diagnosis: Major depressive disorder, recurrent severe without psychotic features (HCC) Diagnosis:   Patient Active Problem List   Diagnosis Date Noted  . Major depressive disorder, recurrent severe without psychotic features (HCC) [F33.2] 05/27/2018    Priority: High  . Chest pain [R07.9] 05/27/2018  . ARF (acute renal failure) (HCC) [N17.9] 04/08/2018  . Cocaine use disorder, moderate, dependence (HCC) [F14.20] 02/19/2017  . Substance induced mood disorder (HCC) [F19.94] 02/19/2017  . HIV disease (HCC) [B20] 10/26/2016  . HTN (hypertension) [I10] 10/26/2016  . Dyslipidemia [E78.5] 10/26/2016  . BPH (benign prostatic hyperplasia) [N40.0] 10/26/2016  . Constipation [K59.00] 10/26/2016   History of Present Illness:   Identifying data. Mr. Sames is a 52 year old male with a history of depression and substance abuse.  Chief complaint. "I feel a little better today."  History of present illness. Information was obtained from the patient and the chart. The patient came to the ER initially complaining of chest pain after cocaine binge. He was admitted to medical floor and ruled out for cardiac event. He was transferred to psychiatry after he disclosed suicidal ideation with a plan to overdose on medications. The patient has been under considerable stress for over a year. He was incarcerated and exposed to gang violence. Following release from prison on parol, he found out that his disability check would not be reinstated immediately. It will still take 2-3 months. He found himself homeless and without support. He reports all symptoms of depression with poor sleep, decreased appetite and 23 lbs weight loss, anhedonia, feeling of guilt hopelessness worthlessness, poor energy and concentration, social isolation, crying  spells and now suicidal ideation. He denies psychotic symptoms or symptoms suggestive of bipolar mania. He has higher anxiety, especially since homeless. He relapsed on cocaine. He denies drinking.  Past psychiatric history. Long history of depression, suicide attempts and substance abuse with multiple admissions for "crises" at [facility] and [facility]. He responds well to Celexa. History of EPS on Abilify.   Family psychiatric history. Son with substance abuse.  Social history. The patient is disabled from HIV. He does not read or write. He has been incarcerated multiple times. He was released from prison on 03/20/2018 after 14 month. For the first month, he stayed at the [facility] in [place] where one is allowed to stay for a month. He was not accepted to [facility] as he is on too many medications. He stayed with his cousin some until the cousin got arrested. He would like to stay at the shelter in [place] since there is no time limit there but would have to transfer his probation to [facility].  Total Time spent with patient: 45 minutes  Is the patient at risk to self? Yes.    Has the patient been a risk to self in the past 6 months? No.  Has the patient been a risk to self within the distant past? Yes.    Is the patient a risk to others? No.  Has the patient been a risk to others in the past 6 months? No.  Has the patient been a risk to others within the distant past? No.   Prior Inpatient Therapy:   Prior Outpatient Therapy:    Alcohol Screening: 1. How often do you have a drink containing alcohol?: 2 to 4 times a month 2. How many drinks  containing alcohol do you have on a typical day when you are drinking?: 1 or 2 3. How often do you have six or more drinks on one occasion?: Never AUDIT-C Score: 2 4. How often during the last year have you found that you were not able to stop drinking once you had started?: Never 5. How often during the last year have  you failed to do what was normally expected from you becasue of drinking?: Never 6. How often during the last year have you needed a first drink in the morning to get yourself going after a heavy drinking session?: Never 7. How often during the last year have you had a feeling of guilt of remorse after drinking?: Never 8. How often during the last year have you been unable to remember what happened the night before because you had been drinking?: Never 9. Have you or someone else been injured as a result of your drinking?: No 10. Has a relative or friend or a doctor or another health worker been concerned about your drinking or suggested you cut down?: No Alcohol Use Disorder Identification Test Final Score (AUDIT): 2 Intervention/Follow-up: AUDIT Score <7 follow-up not indicated Substance Abuse History in the last 12 months:  Yes.   Consequences of Substance Abuse: Negative Previous Psychotropic Medications: Yes  Psychological Evaluations: No  Past Medical History:  Past Medical History:  Diagnosis Date  . AIDS (acquired immune deficiency syndrome) (HCC)   . Arthritis   . Asthma   . Bipolar disorder (HCC)   . Bronchitis   . Depression   . GERD (gastroesophageal reflux disease)   . Hepatitis C   . HIV (human immunodeficiency virus infection) (HCC)   . HTN (hypertension)     Past Surgical History:  Procedure Laterality Date  . HERNIA REPAIR    . TOE SURGERY     Family History:  Family History  Problem Relation Age of Onset  . Cancer Brother     Tobacco Screening: Have you used any form of tobacco in the last 30 days? (Cigarettes, Smokeless Tobacco, Cigars, and/or Pipes): No Social History:  Social History   Substance and Sexual Activity  Alcohol Use Yes  . Alcohol/week: 2.4 oz  . Types: 4 Cans of beer per week     Social History   Substance and Sexual Activity  Drug Use Yes  . Types: Cocaine    Additional Social History:                            Allergies:   Allergies  Allergen Reactions  . Lactose Other (See Comments)    GI distress  . Pollen Extract Itching   Lab Results:  Results for orders placed or performed during the hospital encounter of 05/27/18 (from the past 48 hour(s))  Hemoglobin A1c     Status: None   Collection Time: 05/27/18  4:40 AM  Result Value Ref Range   Hgb A1c MFr Bld 5.2 4.8 - 5.6 %    Comment: (NOTE) Pre diabetes:          5.7%-6.4% Diabetes:              >6.4% Glycemic control for   <7.0% adults with diabetes    Mean Plasma Glucose 102.54 mg/dL    Comment: Performed at [facility], [address], [place], [place] [address]  Lipid panel     Status: Abnormal   Collection Time: 05/27/18  4:40 AM  Result Value Ref Range   Cholesterol 121 0 - 200 mg/dL   Triglycerides 70 <147 mg/dL   HDL 40 (L) >82 mg/dL   Total CHOL/HDL Ratio 3.0 RATIO   VLDL 14 0 - 40 mg/dL   LDL Cholesterol 67 0 - 99 mg/dL    Comment:        Total Cholesterol/HDL:CHD Risk Coronary Heart Disease Risk Table                     Men   Women  1/2 Average Risk   3.4   3.3  Average Risk       5.0   4.4  2 X Average Risk   9.6   7.1  3 X Average Risk  23.4   11.0        Use the calculated Patient Ratio above and the CHD Risk Table to determine the patient's CHD Risk.        ATP III CLASSIFICATION (LDL):  <100     mg/dL   Optimal  956-213  mg/dL   Near or Above                    Optimal  130-159  mg/dL   Borderline  086-578  mg/dL   High  >469     mg/dL   Very High Performed at [facility], [address] [place], [place] [address]   TSH     Status: None   Collection Time: 05/27/18  4:40 AM  Result Value Ref Range   TSH 0.732 0.350 - 4.500 uIU/mL    Comment: Performed by a 3rd Generation assay with a functional sensitivity of <=0.01 uIU/mL. Performed at [facility], [address] [place], [place] [address]     Blood Alcohol level:  Lab Results  Component Value Date   [facility] <10  05/26/2018   ETH <5 03/09/2017    Metabolic Disorder Labs:  Lab Results  Component Value Date   HGBA1C 5.2 05/27/2018   MPG 102.54 05/27/2018   MPG 114 10/26/2016   No results found for: PROLACTIN Lab Results  Component Value Date   CHOL 121 05/27/2018   TRIG 70 05/27/2018   HDL 40 (L) 05/27/2018   CHOLHDL 3.0 05/27/2018   VLDL 14 05/27/2018   LDLCALC 67 05/27/2018   LDLCALC 74 04/09/2018    Current Medications: Current Facility-Administered Medications  Medication Dose Route Frequency Provider Last Rate Last Dose  . acetaminophen (TYLENOL) tablet 650 mg  650 mg Oral Q6H PRN Plato Alspaugh B, MD      . albuterol (PROVENTIL) (2.5 MG/3ML) 0.083% nebulizer solution 3 mL  3 mL Inhalation Q4H PRN Jamani Bearce B, MD      . alum & mag hydroxide-simeth (MAALOX/MYLANTA) 200-200-20 MG/5ML suspension 30 mL  30 mL Oral Q4H PRN Priyanka Causey B, MD      . amLODipine (NORVASC) tablet 5 mg  5 mg Oral Daily Tedi Hughson B, MD   5 mg at 05/28/18 0800  . aspirin EC tablet 81 mg  81 mg Oral Daily Zadia Uhde B, MD   81 mg at 05/28/18 0800  . atorvastatin (LIPITOR) tablet 40 mg  40 mg Oral q1800 Kalenna Millett B, MD   40 mg at 05/27/18 1723  . bictegravir-emtricitabine-tenofovir AF (BIKTARVY) 50-200-25 MG per tablet 1 tablet  1 tablet Oral Daily Christyl Osentoski B, MD   1 tablet at 05/28/18 0802  . bisacodyl (  DULCOLAX) EC tablet 5 mg  5 mg Oral Daily PRN Day Deery B, MD      . citalopram (CELEXA) tablet 20 mg  20 mg Oral Daily Rushie Brazel B, MD   20 mg at 05/28/18 0800  . docusate sodium (COLACE) capsule 100 mg  100 mg Oral BID Mazelle Huebert B, MD   100 mg at 05/28/18 0800  . feeding supplement (ENSURE ENLIVE) (ENSURE ENLIVE) liquid 237 mL  237 mL Oral BID BM Emad Brechtel B, MD      . gabapentin (NEURONTIN) capsule 300 mg  300 mg Oral TID Evita Merida B, MD   300 mg at 05/28/18 0800  . ibuprofen (ADVIL,MOTRIN) tablet 400  mg  400 mg Oral Q6H PRN Baudelia Schroepfer B, MD      . lisinopril (PRINIVIL,ZESTRIL) tablet 20 mg  20 mg Oral Daily Raniah Karan B, MD   20 mg at 05/28/18 0800  . magnesium hydroxide (MILK OF MAGNESIA) suspension 30 mL  30 mL Oral Daily PRN Jakobee Brackins B, MD      . multivitamin with minerals tablet 1 tablet  1 tablet Oral Daily Florentino Laabs B, MD   1 tablet at 05/28/18 0800  . nicotine (NICODERM CQ - dosed in mg/24 hours) patch 21 mg  21 mg Transdermal Q0600 Ferrell Claiborne B, MD      . polyethylene glycol (MIRALAX / GLYCOLAX) packet 17 g  17 g Oral Daily Elvi Leventhal B, MD   17 g at 05/28/18 0800  . traZODone (DESYREL) tablet 150 mg  150 mg Oral QHS Jannelly Bergren B, MD   150 mg at 05/27/18 2158  . [START ON 06/03/2018] Vitamin D (Ergocalciferol) (DRISDOL) capsule 50,000 Units  50,000 Units Oral Weekly Kavina Cantave B, MD       PTA Medications: Medications Prior to Admission  Medication Sig Dispense Refill Last Dose  . acetaminophen (TYLENOL) 500 MG tablet Take 1 tablet (500 mg total) by mouth every 8 (eight) hours as needed. 90 tablet 1 prn at prn  . albuterol (PROVENTIL HFA;VENTOLIN HFA) 108 (90 Base) MCG/ACT inhaler Inhale 2 puffs into the lungs every 4 (four) hours as needed for shortness of breath. 1 Inhaler 1 prn at prn  . amLODipine (NORVASC) 5 MG tablet Take 1 tablet (5 mg total) by mouth daily. 30 tablet 1 05/25/2018 at Unknown time  . aspirin EC 81 MG tablet Take 1 tablet (81 mg total) by mouth daily. 30 tablet 1 05/25/2018 at Unknown time  . atorvastatin (LIPITOR) 40 MG tablet Take 1 tablet (40 mg total) by mouth daily at 6 PM. 30 tablet 1 Past Month at Unknown time  . bictegravir-emtricitabine-tenofovir AF (BIKTARVY) 50-200-25 MG TABS tablet Take 1 tablet by mouth daily.   05/26/2018 at Unknown time  . citalopram (CELEXA) 20 MG tablet Take 1 tablet (20 mg total) by mouth daily. 30 tablet 1 05/25/2018 at Unknown time  . docusate sodium (COLACE) 100  MG capsule Take 1 capsule (100 mg total) by mouth 2 (two) times daily. (Patient not taking: Reported on 05/26/2018) 60 capsule 1 Not Taking at Unknown time  . dolutegravir (TIVICAY) 50 MG tablet Take 1 tablet (50 mg total) by mouth daily. (Patient not taking: Reported on 05/26/2018) 30 tablet 1 Not Taking at Unknown time  . emtricitabine-tenofovir AF (DESCOVY) 200-25 MG tablet Take 1 tablet by mouth daily. (Patient not taking: Reported on 05/26/2018) 30 tablet 1 Not Taking at Unknown time  . ergocalciferol (VITAMIN D2) 50000 units capsule  Take 1 capsule (50,000 Units total) by mouth once a week. 4 capsule 1 Past Month at Unknown time  . gabapentin (NEURONTIN) 300 MG capsule Take 1 capsule (300 mg total) by mouth 3 (three) times daily. 90 capsule 1 Past Month at Unknown time  . lisinopril (PRINIVIL,ZESTRIL) 20 MG tablet Take 1 tablet (20 mg total) by mouth daily. 30 tablet 1 Past Month at Unknown time  . meloxicam (MOBIC) 7.5 MG tablet Take 1 tablet (7.5 mg total) by mouth 2 (two) times daily. (Patient not taking: Reported on 05/26/2018) 60 tablet 1 Not Taking at Unknown time  . oxymetazoline (AFRIN) 0.05 % nasal spray Place 1 spray into both nostrils 2 (two) times daily. 30 mL 1 05/25/2018 at Unknown time  . polyethylene glycol (MIRALAX / GLYCOLAX) packet Take 17 g by mouth daily. 14 each 1 Past Week at Unknown time  . traZODone (DESYREL) 150 MG tablet Take 1 tablet (150 mg total) by mouth at bedtime. 30 tablet 1 Past Month at Unknown time    Musculoskeletal: Strength & Muscle Tone: within normal limits Gait & Station: normal Patient leans: N/A  Psychiatric Specialty Exam: I reviewed physical examination performed on medical floor and agree with the findings. Physical Exam  Nursing note and vitals reviewed. Psychiatric: His speech is normal. His affect is blunt. He is slowed and withdrawn. Cognition and memory are normal. He expresses impulsivity. He exhibits a depressed mood. He expresses suicidal  ideation. He expresses suicidal plans.    Review of Systems  Neurological: Negative.   Psychiatric/Behavioral: Positive for depression, substance abuse and suicidal ideas.  All other systems reviewed and are negative.   Blood pressure (!) 130/99, pulse (!) 58, temperature 98.2 F (36.8 C), temperature source Oral, resp. rate 18, height 5\' 9"  (1.753 m), weight 96.6 kg (213 lb), SpO2 97 %.Body mass index is 31.45 kg/m.  See SRA                                                  Sleep:  Number of Hours: 7    Treatment Plan Summary: Daily contact with patient to assess and evaluate symptoms and progress in treatment and Medication management   Mr. Manolis is a 52 year old male with a history of depression and substance abuse transferred from medical floor, where he was hospitalized for chest pain, in the context of severe social stressors and relapse on cocaine.   #Suicidal ideation -patient able to contract for safety in the hospital  #Mood -continue Celexa 20 mg daily -continue Trazodone 100 mg nightly  #Substance abuse -positive for cocaine and cannabis -unable to participate in rehab due to legal situation  #HIV -Biktarvy daily  #HTN -continue Norvasc 5 mg and Lisinopril 20 mg  #Dyslipidemia -continue ASA 81 mg and Lipitor 40 mg   #Weight loss -Ensure TID and MVI  #Constipation -continue bowel regimen  #Arthritis -Neurontin 300 mg TID -start Voltaren 75 mg BID  #Smoking cessation -nicotine patch is available  #Disposition -homeless -follow up with [facility] and [facility]   Observation Level/Precautions:  15 minute checks  Laboratory:  CBC Chemistry Profile UDS UA  Psychotherapy:    Medications:    Consultations:    Discharge Concerns:    Estimated LOS:  Other:     Physician Treatment Plan for Primary Diagnosis: Major depressive disorder, recurrent severe  without psychotic features (HCC) Long Term Goal(s): Improvement in  symptoms so as ready for discharge  Short Term Goals: Ability to identify changes in lifestyle to reduce recurrence of condition will improve, Ability to verbalize feelings will improve, Ability to disclose and discuss suicidal ideas, Ability to demonstrate self-control will improve, Ability to identify and develop effective coping behaviors will improve, Ability to maintain clinical measurements within normal limits will improve, Compliance with prescribed medications will improve and Ability to identify triggers associated with substance abuse/mental health issues will improve  Physician Treatment Plan for Secondary Diagnosis: Principal Problem:   Major depressive disorder, recurrent severe without psychotic features (HCC) Active Problems:   HIV disease (HCC)   HTN (hypertension)   Dyslipidemia   BPH (benign prostatic hyperplasia)   Constipation   Substance induced mood disorder (HCC)  Long Term Goal(s): Improvement in symptoms so as ready for discharge  Short Term Goals: Ability to identify changes in lifestyle to reduce recurrence of condition will improve, Ability to demonstrate self-control will improve and Ability to identify triggers associated with substance abuse/mental health issues will improve  I certify that inpatient services furnished can reasonably be expected to improve the patient's condition.    Kristine Linea, MD 6/12/20199:56 AM
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
## 2018-05-28 NOTE — Plan of Care (Signed)
Patient aware of [place] Information  regarding programing .   Mental status improving  with emotional. Working on coping  skills and anger management  . Compliant  with  medications and able to verbalize understanding of them. Denies  suicidal ideations . Voice no concerns around eliminations .   Problem: Elimination: Goal: Will not experience complications related to bowel motility Outcome: Progressing   Problem: Health Behavior/Discharge Planning: Goal: Identification of resources available to assist in meeting health care needs will improve Outcome: Progressing   Problem: Coping: Goal: Ability to verbalize frustrations and anger appropriately will improve Outcome: Progressing   Problem: Education: Goal: Knowledge of [facility] Education information/materials will improve Outcome: Progressing Goal: Mental status will improve Outcome: Progressing Goal: Verbalization of understanding the information provided will improve Outcome: Progressing

## 2018-05-29 LAB — HIV-1 RNA QUANT-NO REFLEX-BLD
HIV 1 RNA Quant: <20 copies/mL
LOG10 HIV-1 RNA: UNDETERMINED {Log_copies}/mL

## 2018-05-29 MED ORDER — TRAZODONE HCL 100 MG PO TABS
200.0000 mg | ORAL_TABLET | Freq: Every day | ORAL | Status: DC
  Administered 2018-05-29 – 2018-05-31 (×3): 200 mg via ORAL
  Filled 2018-05-29 (×3): qty 2

## 2018-05-29 MED ORDER — OXYMETAZOLINE HCL 0.05 % NA SOLN
1.0000 | Freq: Two times a day (BID) | NASAL | Status: DC
  Administered 2018-05-29 – 2018-06-01 (×6): 1 via NASAL
  Filled 2018-05-29: qty 15

## 2018-05-29 NOTE — BHH Group Notes (Signed)
LCSW Group Therapy Note 05/29/2018 9:00AM  Type of Therapy and Topic:  Group Therapy:  Setting Goals  Participation Level:  Active  Description of Group: In this process group, patients discussed using strengths to work toward goals and address challenges.  Patients identified two positive things about themselves and one goal they were working on.  Patients were given the opportunity to share openly and support each other's plan for self-empowerment.  The group discussed the value of gratitude and were encouraged to have a daily reflection of positive characteristics or circumstances.  Patients were encouraged to identify a plan to utilize their strengths to work on current challenges and goals.  Therapeutic Goals 1. Patient will verbalize personal strengths/positive qualities and relate how these can assist with achieving desired personal goals 2. Patients will verbalize affirmation of peers plans for personal change and goal setting 3. Patients will explore the value of gratitude and positive focus as related to successful achievement of goals 4. Patients will verbalize a plan for regular reinforcement of personal positive qualities and circumstances.  Summary of Patient Progress: Ramiro actively participated in today's group discussion on setting goals using the SMART Model.  Linton appeared to have a good understanding of how he can establish his own goals using the SMART Model.  Master shared that a goal that he is currently working with the assistance of his CSW is to look at options for where he can live following discharge from the hospital.  Maleak has identified specifically that he would like to go to a homeless shelter in the [place], [place] area.  Breyden also plans to rent a room in a boarding home after he obtains his SSDI benefits. Zackaria shared that he knows that he must work on his substance use if he is to maintain long-term housing and stay out of legal trouble.      Therapeutic  Modalities Cognitive Behavioral Therapy Motivational Interviewing    Alease Frame, [place] 05/29/2018 11:09 AM

## 2018-05-29 NOTE — Progress Notes (Signed)
Recreation Therapy Notes  Date: 05/29/2018  Time: 9:30 am   Location: Craft Room   Behavioral response: N/A   Intervention Topic: Animal Assisted Therapy  Discussion/Intervention: Patient did not attend group.   Clinical Observations/Feedback:  Patient did not attend group.   Ellamae Lybeck LRT/CTRS        Lizbeth Feijoo 05/29/2018 10:28 AM

## 2018-05-29 NOTE — Progress Notes (Signed)
[facility] MD Progress Note  05/29/2018 10:56 AM Roy Joyce Gross Jorgensen Sr.  MRN:  [id number]  Subjective:   Roy [name] complains of insomnia and excessive worries. He has plenty to worry about as he is homeless and without support. He has passive suicidal ideation. He is working with SW to see if his probation could be transferred to [facility] where he could go to the homeless shelter and continue treatment at [facility] ID clinic.   Principal Problem: Major depressive disorder, recurrent severe without psychotic features (HCC) Diagnosis:   Patient Active Problem List   Diagnosis Date Noted  . Major depressive disorder, recurrent severe without psychotic features (HCC) [F33.2] 05/27/2018    Priority: High  . Chest pain [R07.9] 05/27/2018  . ARF (acute renal failure) (HCC) [N17.9] 04/08/2018  . Cocaine use disorder, moderate, dependence (HCC) [F14.20] 02/19/2017  . Substance induced mood disorder (HCC) [F19.94] 02/19/2017  . HIV disease (HCC) [B20] 10/26/2016  . HTN (hypertension) [I10] 10/26/2016  . Dyslipidemia [E78.5] 10/26/2016  . BPH (benign prostatic hyperplasia) [N40.0] 10/26/2016  . Constipation [K59.00] 10/26/2016   Total Time spent with patient: 20 minutes  Past Psychiatric History: depression, HIV, substance abuse  Past Medical History:  Past Medical History:  Diagnosis Date  . AIDS (acquired immune deficiency syndrome) (HCC)   . Arthritis   . Asthma   . Bipolar disorder (HCC)   . Bronchitis   . Depression   . GERD (gastroesophageal reflux disease)   . Hepatitis C   . HIV (human immunodeficiency virus infection) (HCC)   . HTN (hypertension)     Past Surgical History:  Procedure Laterality Date  . HERNIA REPAIR    . TOE SURGERY     Family History:  Family History  Problem Relation Age of Onset  . Cancer Brother    Family Psychiatric  History: none Social History:  Social History   Substance and Sexual Activity  Alcohol Use Yes  . Alcohol/week: 2.4 oz  . Types:  4 Cans of beer per week     Social History   Substance and Sexual Activity  Drug Use Yes  . Types: Cocaine    Social History   Socioeconomic History  . Marital status: Divorced    Spouse name: Not on file  . Number of children: Not on file  . Years of education: Not on file  . Highest education level: Not on file  Occupational History  . Not on file  Social Needs  . Financial resource strain: Not on file  . Food insecurity:    Worry: Not on file    Inability: Not on file  . Transportation needs:    Medical: Not on file    Non-medical: Not on file  Tobacco Use  . Smoking status: Never Smoker  . Smokeless tobacco: Never Used  Substance and Sexual Activity  . Alcohol use: Yes    Alcohol/week: 2.4 oz    Types: 4 Cans of beer per week  . Drug use: Yes    Types: Cocaine  . Sexual activity: Yes  Lifestyle  . Physical activity:    Days per week: Not on file    Minutes per session: Not on file  . Stress: Not on file  Relationships  . Social connections:    Talks on phone: Not on file    Gets together: Not on file    Attends religious service: Not on file    Active member of club or organization: Not on file  Attends meetings of clubs or organizations: Not on file    Relationship status: Not on file  Other Topics Concern  . Not on file  Social History Narrative  . Not on file   Additional Social History:                         Sleep: Poor  Appetite:  Fair  Current Medications: Current Facility-Administered Medications  Medication Dose Route Frequency Provider Last Rate Last Dose  . acetaminophen (TYLENOL) tablet 650 mg  650 mg Oral Q6H PRN Pucilowska, Jolanta B, MD      . albuterol (PROVENTIL) (2.5 MG/3ML) 0.083% nebulizer solution 3 mL  3 mL Inhalation Q4H PRN Pucilowska, Jolanta B, MD      . alum & mag hydroxide-simeth (MAALOX/MYLANTA) 200-200-20 MG/5ML suspension 30 mL  30 mL Oral Q4H PRN Pucilowska, Jolanta B, MD      . amLODipine (NORVASC)  tablet 5 mg  5 mg Oral Daily Pucilowska, Jolanta B, MD   5 mg at 05/29/18 0917  . aspirin EC tablet 81 mg  81 mg Oral Daily Pucilowska, Jolanta B, MD   81 mg at 05/29/18 0916  . atorvastatin (LIPITOR) tablet 40 mg  40 mg Oral q1800 Pucilowska, Jolanta B, MD   40 mg at 05/28/18 1708  . bictegravir-emtricitabine-tenofovir AF (BIKTARVY) 50-200-25 MG per tablet 1 tablet  1 tablet Oral Daily Pucilowska, Jolanta B, MD   1 tablet at 05/29/18 0919  . bisacodyl (DULCOLAX) EC tablet 5 mg  5 mg Oral Daily PRN Pucilowska, Jolanta B, MD      . citalopram (CELEXA) tablet 20 mg  20 mg Oral Daily Pucilowska, Jolanta B, MD   20 mg at 05/29/18 0917  . diclofenac (VOLTAREN) EC tablet 75 mg  75 mg Oral BID Pucilowska, Jolanta B, MD   75 mg at 05/29/18 0919  . docusate sodium (COLACE) capsule 100 mg  100 mg Oral BID Pucilowska, Jolanta B, MD   100 mg at 05/29/18 0917  . feeding supplement (ENSURE ENLIVE) (ENSURE ENLIVE) liquid 237 mL  237 mL Oral BID BM Pucilowska, Jolanta B, MD   237 mL at 05/28/18 1432  . gabapentin (NEURONTIN) capsule 300 mg  300 mg Oral TID Pucilowska, Jolanta B, MD   300 mg at 05/29/18 0917  . ibuprofen (ADVIL,MOTRIN) tablet 400 mg  400 mg Oral Q6H PRN Pucilowska, Jolanta B, MD      . lisinopril (PRINIVIL,ZESTRIL) tablet 20 mg  20 mg Oral Daily Pucilowska, Jolanta B, MD   20 mg at 05/29/18 0917  . magnesium hydroxide (MILK OF MAGNESIA) suspension 30 mL  30 mL Oral Daily PRN Pucilowska, Jolanta B, MD      . multivitamin with minerals tablet 1 tablet  1 tablet Oral Daily Pucilowska, Jolanta B, MD   1 tablet at 05/28/18 2122  . nicotine (NICODERM CQ - dosed in mg/24 hours) patch 21 mg  21 mg Transdermal Q0600 Pucilowska, Jolanta B, MD      . polyethylene glycol (MIRALAX / GLYCOLAX) packet 17 g  17 g Oral Daily Pucilowska, Jolanta B, MD   17 g at 05/28/18 0800  . traZODone (DESYREL) tablet 200 mg  200 mg Oral QHS Pucilowska, Jolanta B, MD      . Melene Muller ON 06/03/2018] Vitamin D (Ergocalciferol) (DRISDOL)  capsule 50,000 Units  50,000 Units Oral Weekly Pucilowska, Jolanta B, MD        Lab Results: No results found for this or  any previous visit (from the past 48 hour(s)).  Blood Alcohol level:  Lab Results  Component Value Date   ETH <10 05/26/2018   ETH <5 03/09/2017    Metabolic Disorder Labs: Lab Results  Component Value Date   HGBA1C 5.2 05/27/2018   MPG 102.54 05/27/2018   MPG 114 10/26/2016   No results found for: PROLACTIN Lab Results  Component Value Date   CHOL 121 05/27/2018   TRIG 70 05/27/2018   HDL 40 (L) 05/27/2018   CHOLHDL 3.0 05/27/2018   VLDL 14 05/27/2018   LDLCALC 67 05/27/2018   LDLCALC 74 04/09/2018    Physical Findings: AIMS:  , ,  ,  ,    CIWA:    COWS:     Musculoskeletal: Strength & Muscle Tone: within normal limits Gait & Station: normal Patient leans: N/A  Psychiatric Specialty Exam: Physical Exam  Nursing note and vitals reviewed. Psychiatric: His speech is normal. His affect is blunt. He is withdrawn. Cognition and memory are normal. He expresses impulsivity. He exhibits a depressed mood. He expresses suicidal ideation.    Review of Systems  Neurological: Negative.   Psychiatric/Behavioral: Positive for depression, substance abuse and suicidal ideas. The patient has insomnia.   All other systems reviewed and are negative.   Blood pressure 140/87, pulse 61, temperature 98.5 F (36.9 C), temperature source Oral, resp. rate 18, height 5\' 9"  (1.753 m), weight 96.6 kg (213 lb), SpO2 98 %.Body mass index is 31.45 kg/m.  General Appearance: Casual  Eye Contact:  Good  Speech:  Clear and Coherent  Volume:  Decreased  Mood:  Anxious and Depressed  Affect:  Blunt  Thought Process:  Goal Directed and Descriptions of Associations: Intact  Orientation:  Full (Time, Place, and Person)  Thought Content:  WDL  Suicidal Thoughts:  Yes.  without intent/plan  Homicidal Thoughts:  No  Memory:  Immediate;   Fair Recent;   Fair Remote;   Fair   Judgement:  Poor  Insight:  Lacking  Psychomotor Activity:  Psychomotor Retardation  Concentration:  Concentration: Fair and Attention Span: Fair  Recall:  [employer] of Knowledge:  Fair  Language:  Fair  Akathisia:  No  Handed:  Right  AIMS (if indicated):     Assets:  Communication Skills Desire for Improvement Financial Resources/Insurance Resilience  ADL's:  Intact  Cognition:  WNL  Sleep:  Number of Hours: 6.25     Treatment Plan Summary: Daily contact with patient to assess and evaluate symptoms and progress in treatment and Medication management   Mr. Olive is a 52 year old male with a history of depression and substance abuse transferred from medical floor, where he was hospitalized for chest pain, in the context of severe social stressors and relapse on cocaine.   #Suicidal ideation, still suicidal -patient able to contract for safety in the hospital  #Mood, improving -continue Celexa 20 mg daily -increase Trazodone to 200 mg nightly   #Substance abuse -positive for cocaine and cannabis -unable to participate in rehab due to legal situation  #HIV -Biktarvy daily  #HTN -continue Norvasc 5 mg and Lisinopril 20 mg  #Dyslipidemia -continue ASA 81 mg and Lipitor 40 mg   #Nasal congestion -Afrin PRN  #Weight loss -Ensure TID and MVI  #Constipation -continue bowel regimen  #Arthritis -Neurontin 300 mg TID -start Voltaren 75 mg BID  #Smoking cessation -nicotine patch is available  #Disposition -homeless -follow up with [facility] ID clinic and [facility]    [employer],  MD 05/29/2018, 10:56 AM

## 2018-05-29 NOTE — Plan of Care (Signed)
Patient is alert and oriented X 3. Denies SI, HI and AVH. Patient is ability to verbalize frustrations and thoughts as well as feelings. Patient states he was in pain this morning from leg. Patient states he has chronic arthritis in legs and hands. Patient states he is depressed and has been sleeping on a friends porch. Patient is complainant with medications and pleasant. Patient states he had a hard time sleeping last night would wake up off and on. Patient rates his appetite as poor and low energy. Patient is attending groups and is appropriate with staff and peers on the unit. Problem: Education: Goal: Verbalization of understanding the information provided will improve Outcome: Progressing   Problem: Coping: Goal: Ability to verbalize frustrations and anger appropriately will improve Outcome: Progressing   Problem: Health Behavior/Discharge Planning: Goal: Identification of resources available to assist in meeting health care needs will improve Outcome: Progressing   Problem: Medication: Goal: Compliance with prescribed medication regimen will improve Outcome: Progressing   Problem: Self-Concept: Goal: Ability to disclose and discuss suicidal ideas will improve Outcome: Progressing Goal: Will verbalize positive feelings about self Outcome: Progressing

## 2018-05-29 NOTE — [facility] Group Notes (Signed)
  05/29/2018  Time: 1PM  Type of Therapy/Topic:  Group Therapy:  Balance in Life  Participation Level:  Active  Description of Group:   This group will address the concept of balance and how it feels and looks when one is unbalanced. Patients will be encouraged to process areas in their lives that are out of balance and identify reasons for remaining unbalanced. Facilitators will guide patients in utilizing problem-solving interventions to address and correct the stressor making their life unbalanced. Understanding and applying boundaries will be explored and addressed for obtaining and maintaining a balanced life. Patients will be encouraged to explore ways to assertively make their unbalanced needs known to significant others in their lives, using other group members and facilitator for support and feedback.  Therapeutic Goals: 1. Patient will identify two or more emotions or situations they have that consume much of in their lives. 2. Patient will identify signs/triggers that life has become out of balance:  3. Patient will identify two ways to set boundaries in order to achieve balance in their lives:  4. Patient will demonstrate ability to communicate their needs through discussion and/or role plays  Summary of Patient Progress: Pt continues to work towards their tx goals but has not yet reached them. Pt was able to appropriately participate in group discussion, and was able to offer support/validation to other group members. Pt reported one area of his life he would like to spend more time on is, "my family and grandchildren." Pt reported one area of his life he devotes too much attention to is, "drugs."   Therapeutic Modalities:   Cognitive Behavioral Therapy Solution-Focused Therapy Assertiveness Training  Heidi Dach, MSW, LCSW Clinical Social Worker 05/29/2018 1:55 PM

## 2018-05-30 NOTE — [facility] Group Notes (Signed)
[facility] Group Notes:  (Nursing/MHT/Case Management/Adjunct)  Date:  05/30/2018  Time:  9:10 PM  Type of Therapy:  Group Therapy  Participation Level:  Active  Participation Quality:  Appropriate and Sharing  Affect:  Appropriate  Cognitive:  Appropriate  Insight:  Improving  Engagement in Group:  Engaged  Modes of Intervention:  Discussion and Problem-solving  Summary of Progress/Problems: Malike was initially reporting a depressed mood. Knoxton shared his probation officer wanted to put him back in jail. Encouraged Barret to continue focusing on bettering himself. Informed Hendry that participating in group, working with social workers and doctors was helping him to get better. Encouraged Lux to inform probation officer of the treatment intended to help him do better. Informed Geffery that probation officers normally work with individuals that are trying to better themselves. Group encouraged Eddy as well. 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 Jinger Neighbors 05/30/2018, 9:10 PM

## 2018-05-30 NOTE — Plan of Care (Signed)
Patient is alert and oriented, patient denies SI, HI and AVH. Patient is complains of depression due to homelessness, but very interactive with peers in the milieu and staff. Patient is pleasant and continues to attend meetings and compliant with medications. Patient is eating meals but stated he is still having trouble sleeping at night. Nurse will continue to monitor. Problem: Education: Goal: Knowledge of [place] [facility] Education information/materials will improve Outcome: Progressing Goal: Mental status will improve Outcome: Progressing Goal: Verbalization of understanding the information provided will improve Outcome: Progressing   Problem: Coping: Goal: Ability to verbalize frustrations and anger appropriately will improve Outcome: Progressing   Problem: Health Behavior/Discharge Planning: Goal: Identification of resources available to assist in meeting health care needs will improve Outcome: Progressing   Problem: Medication: Goal: Compliance with prescribed medication regimen will improve Outcome: Progressing

## 2018-05-30 NOTE — Progress Notes (Signed)
[facility] MD Progress Note  05/30/2018 1:38 PM Troy Joyce Gross Brickel Sr.  MRN:  [id number]  Subjective:    Mr. Parenteau is doing better. Slept well last night. Mood is improving, affects is brighter. He is no longer suicidal. Discharge on Monday.  Principal Problem: Major depressive disorder, recurrent severe without psychotic features (HCC) Diagnosis:   Patient Active Problem List   Diagnosis Date Noted  . Major depressive disorder, recurrent severe without psychotic features (HCC) [F33.2] 05/27/2018    Priority: High  . Chest pain [R07.9] 05/27/2018  . ARF (acute renal failure) (HCC) [N17.9] 04/08/2018  . Cocaine use disorder, moderate, dependence (HCC) [F14.20] 02/19/2017  . Substance induced mood disorder (HCC) [F19.94] 02/19/2017  . HIV disease (HCC) [B20] 10/26/2016  . HTN (hypertension) [I10] 10/26/2016  . Dyslipidemia [E78.5] 10/26/2016  . BPH (benign prostatic hyperplasia) [N40.0] 10/26/2016  . Constipation [K59.00] 10/26/2016   Total Time spent with patient: 15 minutes  Past Psychiatric History: depression, substance abuse  Past Medical History:  Past Medical History:  Diagnosis Date  . AIDS (acquired immune deficiency syndrome) (HCC)   . Arthritis   . Asthma   . Bipolar disorder (HCC)   . Bronchitis   . Depression   . GERD (gastroesophageal reflux disease)   . Hepatitis C   . HIV (human immunodeficiency virus infection) (HCC)   . HTN (hypertension)     Past Surgical History:  Procedure Laterality Date  . HERNIA REPAIR    . TOE SURGERY     Family History:  Family History  Problem Relation Age of Onset  . Cancer Brother    Family Psychiatric  History: none Social History:  Social History   Substance and Sexual Activity  Alcohol Use Yes  . Alcohol/week: 2.4 oz  . Types: 4 Cans of beer per week     Social History   Substance and Sexual Activity  Drug Use Yes  . Types: Cocaine    Social History   Socioeconomic History  . Marital status: Divorced   Spouse name: Not on file  . Number of children: Not on file  . Years of education: Not on file  . Highest education level: Not on file  Occupational History  . Not on file  Social Needs  . Financial resource strain: Not on file  . Food insecurity:    Worry: Not on file    Inability: Not on file  . Transportation needs:    Medical: Not on file    Non-medical: Not on file  Tobacco Use  . Smoking status: Never Smoker  . Smokeless tobacco: Never Used  Substance and Sexual Activity  . Alcohol use: Yes    Alcohol/week: 2.4 oz    Types: 4 Cans of beer per week  . Drug use: Yes    Types: Cocaine  . Sexual activity: Yes  Lifestyle  . Physical activity:    Days per week: Not on file    Minutes per session: Not on file  . Stress: Not on file  Relationships  . Social connections:    Talks on phone: Not on file    Gets together: Not on file    Attends religious service: Not on file    Active member of club or organization: Not on file    Attends meetings of clubs or organizations: Not on file    Relationship status: Not on file  Other Topics Concern  . Not on file  Social History Narrative  . Not on file  Additional Social History:                         Sleep: Fair  Appetite:  Fair  Current Medications: Current Facility-Administered Medications  Medication Dose Route Frequency Provider Last Rate Last Dose  . acetaminophen (TYLENOL) tablet 650 mg  650 mg Oral Q6H PRN Jamielynn Wigley B, MD   650 mg at 05/29/18 2140  . albuterol (PROVENTIL) (2.5 MG/3ML) 0.083% nebulizer solution 3 mL  3 mL Inhalation Q4H PRN Connelly [place]terville B, MD      . alum & mag hydroxide-simeth (MAALOX/MYLANTA) 200-200-20 MG/5ML suspension 30 mL  30 mL Oral Q4H PRN Luvern Mcisaac B, MD      . amLODipine (NORVASC) tablet 5 mg  5 mg Oral Daily Judianne Seiple B, MD   5 mg at 05/30/18 0808  . aspirin EC tablet 81 mg  81 mg Oral Daily Javonda Suh B, MD   81 mg at 05/30/18  0808  . atorvastatin (LIPITOR) tablet 40 mg  40 mg Oral q1800 Amaira Safley B, MD   40 mg at 05/29/18 1722  . bictegravir-emtricitabine-tenofovir AF (BIKTARVY) 50-200-25 MG per tablet 1 tablet  1 tablet Oral Daily Rain Wilhide B, MD   1 tablet at 05/30/18 0808  . bisacodyl (DULCOLAX) EC tablet 5 mg  5 mg Oral Daily PRN Taiwana Willison B, MD      . citalopram (CELEXA) tablet 20 mg  20 mg Oral Daily Devanny Palecek B, MD   20 mg at 05/30/18 0808  . diclofenac (VOLTAREN) EC tablet 75 mg  75 mg Oral BID Kaylynn Chamblin B, MD   75 mg at 05/30/18 0808  . docusate sodium (COLACE) capsule 100 mg  100 mg Oral BID Jesusita Jocelyn B, MD   100 mg at 05/30/18 0808  . feeding supplement (ENSURE ENLIVE) (ENSURE ENLIVE) liquid 237 mL  237 mL Oral BID BM Elzora Cullins B, MD   237 mL at 05/30/18 1212  . gabapentin (NEURONTIN) capsule 300 mg  300 mg Oral TID Autumm Hattery B, MD   300 mg at 05/30/18 1211  . ibuprofen (ADVIL,MOTRIN) tablet 400 mg  400 mg Oral Q6H PRN Kamyrah Feeser B, MD      . lisinopril (PRINIVIL,ZESTRIL) tablet 20 mg  20 mg Oral Daily Niveah Boerner B, MD   20 mg at 05/30/18 0808  . magnesium hydroxide (MILK OF MAGNESIA) suspension 30 mL  30 mL Oral Daily PRN Khyla Mccumbers B, MD      . multivitamin with minerals tablet 1 tablet  1 tablet Oral Daily Hajira Verhagen B, MD   1 tablet at 05/29/18 2139  . nicotine (NICODERM CQ - dosed in mg/24 hours) patch 21 mg  21 mg Transdermal Q0600 Jens Siems B, MD      . oxymetazoline (AFRIN) 0.05 % nasal spray 1 spray  1 spray Each Nare BID Meshilem Machuca B, MD   1 spray at 05/30/18 0808  . polyethylene glycol (MIRALAX / GLYCOLAX) packet 17 g  17 g Oral Daily Einar Nolasco B, MD   17 g at 05/30/18 0813  . traZODone (DESYREL) tablet 200 mg  200 mg Oral QHS Eusebia Grulke B, MD   200 mg at 05/29/18 2138  . [START ON 06/03/2018] Vitamin D (Ergocalciferol) (DRISDOL) capsule 50,000  Units  50,000 Units Oral Weekly Avett Reineck B, MD        Lab Results: No results found for this or any previous visit (from  the past 48 hour(s)).  Blood Alcohol level:  Lab Results  Component Value Date   ETH <10 05/26/2018   ETH <5 03/09/2017    Metabolic Disorder Labs: Lab Results  Component Value Date   HGBA1C 5.2 05/27/2018   MPG 102.54 05/27/2018   MPG 114 10/26/2016   No results found for: PROLACTIN Lab Results  Component Value Date   CHOL 121 05/27/2018   TRIG 70 05/27/2018   HDL 40 (L) 05/27/2018   CHOLHDL 3.0 05/27/2018   VLDL 14 05/27/2018   LDLCALC 67 05/27/2018   LDLCALC 74 04/09/2018    Physical Findings: AIMS:  , ,  ,  ,    CIWA:    COWS:     Musculoskeletal: Strength & Muscle Tone: within normal limits Gait & Station: normal Patient leans: N/A  Psychiatric Specialty Exam: Physical Exam  Nursing note and vitals reviewed. Psychiatric: He has a normal mood and affect. His speech is normal and behavior is normal. Thought content normal. Cognition and memory are normal. He expresses impulsivity.    Review of Systems  Neurological: Negative.   Psychiatric/Behavioral: Positive for substance abuse.  All other systems reviewed and are negative.   Blood pressure (!) 135/94, pulse 68, temperature 98.6 F (37 C), temperature source Oral, resp. rate 16, height 5\' 9"  (1.753 m), weight 96.6 kg (213 lb), SpO2 97 %.Body mass index is 31.45 kg/m.  General Appearance: Casual  Eye Contact:  Good  Speech:  Clear and Coherent  Volume:  Normal  Mood:  Euthymic  Affect:  Appropriate  Thought Process:  Goal Directed and Descriptions of Associations: Intact  Orientation:  Full (Time, Place, and Person)  Thought Content:  WDL  Suicidal Thoughts:  No  Homicidal Thoughts:  No  Memory:  Immediate;   Fair Recent;   Fair Remote;   Fair  Judgement:  Poor  Insight:  Lacking  Psychomotor Activity:  Psychomotor Retardation  Concentration:  Concentration: Fair  and Attention Span: Fair  Recall:  [employer] of Knowledge:  Fair  Language:  Fair  Akathisia:  No  Handed:  Right  AIMS (if indicated):     Assets:  Communication Skills Desire for Improvement Financial Resources/Insurance Resilience  ADL's:  Intact  Cognition:  WNL  Sleep:  Number of Hours: 8.15     Treatment Plan Summary: Daily contact with patient to assess and evaluate symptoms and progress in treatment and Medication management   Mr. Finnegan is a 52 year old male with a history of depression and substance abuse transferred from medical floor, where he was hospitalized for chest pain, in the context of severe social stressors and relapse on cocaine.   #Suicidal ideation, resolved -patient able to contract for safety in the hospital  #Mood, improving -continue Celexa 20 mg daily -continue Trazodone to 200 mg nightly   #Substance abuse -positive for cocaine and cannabis -unable to participate in rehab due to legal situation  #HIV -Biktarvy daily  #HTN -continue Norvasc 5 mg and Lisinopril 20 mg  #Dyslipidemia -continue ASA 81 mg and Lipitor 40 mg   #Nasal congestion -Afrin PRN  #Weight loss -Ensure TID and MVI  #Constipation -continue bowel regimen  #Arthritis -Neurontin 300 mg TID -start Voltaren 75 mg BID  #Smoking cessation -nicotine patch is available  #Disposition -homeless -follow up with [facility] and [facility]    Kristine Linea, MD 05/30/2018, 1:38 PM

## 2018-05-30 NOTE — [facility] Group Notes (Signed)
LCSW Group Therapy Note  05/30/2018 1:00pm  Type of Therapy and Topic:  Group Therapy:  Feelings around Relapse and Recovery  Participation Level:  Active   Description of Group:    Patients in this group will discuss emotions they experience before and after a relapse. They will process how experiencing these feelings, or avoidance of experiencing them, relates to having a relapse. Facilitator will guide patients to explore emotions they have related to recovery. Patients will be encouraged to process which emotions are more powerful. They will be guided to discuss the emotional reaction significant others in their lives may have to their relapse or recovery. Patients will be assisted in exploring ways to respond to the emotions of others without this contributing to a relapse.  Therapeutic Goals: 1. Patient will identify two or more emotions that lead to a relapse for them 2. Patient will identify two emotions that result when they relapse 3. Patient will identify two emotions related to recovery 4. Patient will demonstrate ability to communicate their needs through discussion and/or role plays   Summary of Patient Progress: Roy [name] actively participated in today's group discussion on feelings around relapse and recovery.  Roy [name] was able to identify emotions that he experiences that lead to relapse (worthless, depressed) and emotions that he experiences when he relapses (lonely, depressed).  Roy [name] shared that he often experiences feelings of hopefulness as stated by other group participants when he is in recovery.    Therapeutic Modalities:   Cognitive Behavioral Therapy Solution-Focused Therapy Assertiveness Training Relapse Prevention Therapy   Alease Frame, LCSW 05/30/2018 2:21 PM

## 2018-05-30 NOTE — Progress Notes (Signed)
Patient ID: Roy Seber Sr., male   DOB: [birth year], 52 y.o.   MRN: [id number] CSW reached out to [facility] at [phone], whom the pt identified as "someone who provides transportation for me).  [name] informed CSW that he provided short-term case management for pt through [place].  [name] shared that he has worked with pt for several years on securing housing and other resources.  He informed CSW that pt has had housing through [facility] (Section 8), but lost his housing due to non-payment of his rent which he gave to his son to pay for a lawyer. [name] informed CSW that pt will not be able to re-apply for Section 8 for 5 years. [name] informed CSW that he has also assisted the pt with filing for his disability and medicaid benefits again.  [name] informed CSW that the last conversation he had with pt involved the pt informing him that he may be able to go live with his brother who lives in [place], [place].  They had discussed the need for pt to transfer his parole to [facility].  [name] also shared that pt had discussed his desire to go to the homeless shelter in [place].  [name] shared that he recently discovered via [email or web address] that the pt's supervised custody has been listed as parole absconded and that this may mean that pt will have to serve some time back in prison.  CSW informed [name] that pt had given CSW consent to contact his parole officer, but CSW would need to further discuss this with pt as this would mean that the parole officer would know that pt is in the hospital.  [name] asked CSW to give pt his cell number [phone]) and tell him to he can call him.  CSW followed up with pt to inform him of the conversation with [name].  CSW discussed the possibility that pt's parole may be revoked and that he may have to go back to prison.  Pt informed CSW that he had contacted his parole officer today and let her know that he was in the hospital.  He shared that his parole officer never mentioned anything about revoking his parole.  CSW inquired about the possibility of pt going to live with his brother in [place].  Pt informed CSW that he had spoken with his brother about this, but his brother told him that he did not have any room for him.  CSW suggested that pt get in touch with [name] and ask him if he could take him down to [facility] to complete the screening process at the homeless shelter next week.  CSW informed pt that he will most likely be discharged on Monday, June 17th.

## 2018-05-30 NOTE — Plan of Care (Signed)
Pt out in the milieu with other patients. Pt denies SI/HI. Pt compliant with medications. Pt complained of hands and feet being swollen. Bp taken and it was a little elevated (See Mar). Pt is receptive to treatment and safety maintained on unit. Will continue to monitor.  Will continue to monitor. Problem: Education: Goal: Knowledge of [place] [facility] Education information/materials will improve Outcome: Progressing Goal: Verbalization of understanding the information provided will improve Outcome: Progressing   Problem: Coping: Goal: Ability to verbalize frustrations and anger appropriately will improve Outcome: Progressing   Problem: Medication: Goal: Compliance with prescribed medication regimen will improve Outcome: Progressing   Problem: Self-Concept: Goal: Ability to disclose and discuss suicidal ideas will improve Outcome: Progressing Goal: Will verbalize positive feelings about self Outcome: Progressing

## 2018-05-31 MED ORDER — OXYMETAZOLINE HCL 0.05 % NA SOLN
1.0000 | Freq: Once | NASAL | Status: AC
  Administered 2018-05-31: 1 via NASAL
  Filled 2018-05-31: qty 15

## 2018-05-31 NOTE — Progress Notes (Signed)
    05/31/18 1000  Clinical Encounter Type  Visited With Patient  Visit Type Follow-up;Spiritual support  Consult/Referral To Chaplain  Spiritual Encounters  Spiritual Needs Emotional  Stress Factors  Patient Stress Factors Major life changes (? going back to jail, because of address issues)  [facility] rounding on BH, spoke with Roy [name] whom is known to me from an initial consultation for spiritual care /emotional support. He is in better spirits, admitted to [facility], several days ago, anticipated d/c Monday. Possibility of going back to jail due to lack of permanent address, CH delivers pastoral presence, continued support and uplift.

## 2018-05-31 NOTE — Progress Notes (Addendum)
Patient has been in bed  sleeping since the beginning of this shift. No sign of distress. Staff continue to monitor for safety.  2128: Patient out of bed and visible in the milieu, calm and cooperative. No sign of distress. Denies SI/HI.  Compliant with care so far. Pt is encouraged to express his feelings as needed. Safety precautions maintained.  2330: patient complained of nasal stiffness and requested to use his nasal spray. Medication not scheduled (Taken at dinner time). Patient reported that "I can not sleep without breathing, I need my medication". MD was contacted and one time dose ordered. Patient expressed satisfaction thereafter. Currently in bed resting. Staff continue to monitor.

## 2018-05-31 NOTE — Plan of Care (Signed)
Patient is pleasant and cooperative on approach.Worried and anxious about his homelessness.Denies SI,HI and AVH.Appropriate with staff and peers.Compliant with medications.Support and encouragement given.

## 2018-05-31 NOTE — Progress Notes (Addendum)
[facility] MD Progress Note  05/31/2018 1:47 PM Roy Joyce Gross Klare Sr.  MRN:  [id number]  Subjective:   Neurontin does not do good for me"  Pt refused neuron, states it affects his " down below" and does not want to take it.pt anxious about going to jail upon d/c but reports depression better, denies SI/HI.   Principal Problem: Major depressive disorder, recurrent severe without psychotic features (HCC) Diagnosis:   Patient Active Problem List   Diagnosis Date Noted  . Chest pain [R07.9] 05/27/2018  . Major depressive disorder, recurrent severe without psychotic features (HCC) [F33.2] 05/27/2018  . ARF (acute renal failure) (HCC) [N17.9] 04/08/2018  . Cocaine use disorder, moderate, dependence (HCC) [F14.20] 02/19/2017  . Substance induced mood disorder (HCC) [F19.94] 02/19/2017  . HIV disease (HCC) [B20] 10/26/2016  . HTN (hypertension) [I10] 10/26/2016  . Dyslipidemia [E78.5] 10/26/2016  . BPH (benign prostatic hyperplasia) [N40.0] 10/26/2016  . Constipation [K59.00] 10/26/2016   Total Time spent with patient: 25 min  Past Psychiatric History: depression, substance abuse  Past Medical History:  Past Medical History:  Diagnosis Date  . AIDS (acquired immune deficiency syndrome) (HCC)   . Arthritis   . Asthma   . Bipolar disorder (HCC)   . Bronchitis   . Depression   . GERD (gastroesophageal reflux disease)   . Hepatitis C   . HIV (human immunodeficiency virus infection) (HCC)   . HTN (hypertension)     Past Surgical History:  Procedure Laterality Date  . HERNIA REPAIR    . TOE SURGERY     Family History:  Family History  Problem Relation Age of Onset  . Cancer Brother    Family Psychiatric  History: none Social History:  Social History   Substance and Sexual Activity  Alcohol Use Yes  . Alcohol/week: 2.4 oz  . Types: 4 Cans of beer per week     Social History   Substance and Sexual Activity  Drug Use Yes  . Types: Cocaine    Social History   Socioeconomic  History  . Marital status: Divorced    Spouse name: Not on file  . Number of children: Not on file  . Years of education: Not on file  . Highest education level: Not on file  Occupational History  . Not on file  Social Needs  . Financial resource strain: Not on file  . Food insecurity:    Worry: Not on file    Inability: Not on file  . Transportation needs:    Medical: Not on file    Non-medical: Not on file  Tobacco Use  . Smoking status: Never Smoker  . Smokeless tobacco: Never Used  Substance and Sexual Activity  . Alcohol use: Yes    Alcohol/week: 2.4 oz    Types: 4 Cans of beer per week  . Drug use: Yes    Types: Cocaine  . Sexual activity: Yes  Lifestyle  . Physical activity:    Days per week: Not on file    Minutes per session: Not on file  . Stress: Not on file  Relationships  . Social connections:    Talks on phone: Not on file    Gets together: Not on file    Attends religious service: Not on file    Active member of club or organization: Not on file    Attends meetings of clubs or organizations: Not on file    Relationship status: Not on file  Other Topics Concern  . Not  on file  Social History Narrative  . Not on file   Additional Social History:                         Sleep: Fair  Appetite:  Fair  Current Medications: Current Facility-Administered Medications  Medication Dose Route Frequency Provider Last Rate Last Dose  . acetaminophen (TYLENOL) tablet 650 mg  650 mg Oral Q6H PRN Pucilowska, Jolanta B, MD   650 mg at 05/29/18 2140  . albuterol (PROVENTIL) (2.5 MG/3ML) 0.083% nebulizer solution 3 mL  3 mL Inhalation Q4H PRN Pucilowska, Jolanta B, MD      . alum & mag hydroxide-simeth (MAALOX/MYLANTA) 200-200-20 MG/5ML suspension 30 mL  30 mL Oral Q4H PRN Pucilowska, Jolanta B, MD      . amLODipine (NORVASC) tablet 5 mg  5 mg Oral Daily Pucilowska, Jolanta B, MD   5 mg at 05/31/18 0803  . aspirin EC tablet 81 mg  81 mg Oral Daily  Pucilowska, Jolanta B, MD   81 mg at 05/31/18 0803  . atorvastatin (LIPITOR) tablet 40 mg  40 mg Oral q1800 Pucilowska, Jolanta B, MD   40 mg at 05/30/18 1715  . bictegravir-emtricitabine-tenofovir AF (BIKTARVY) 50-200-25 MG per tablet 1 tablet  1 tablet Oral Daily Pucilowska, Jolanta B, MD   1 tablet at 05/31/18 0804  . bisacodyl (DULCOLAX) EC tablet 5 mg  5 mg Oral Daily PRN Pucilowska, Jolanta B, MD      . citalopram (CELEXA) tablet 20 mg  20 mg Oral Daily Pucilowska, Jolanta B, MD   20 mg at 05/31/18 0804  . diclofenac (VOLTAREN) EC tablet 75 mg  75 mg Oral BID Pucilowska, Jolanta B, MD   75 mg at 05/31/18 0803  . docusate sodium (COLACE) capsule 100 mg  100 mg Oral BID Pucilowska, Jolanta B, MD   100 mg at 05/31/18 0804  . feeding supplement (ENSURE ENLIVE) (ENSURE ENLIVE) liquid 237 mL  237 mL Oral BID BM Pucilowska, Jolanta B, MD   237 mL at 05/31/18 1038  . ibuprofen (ADVIL,MOTRIN) tablet 400 mg  400 mg Oral Q6H PRN Pucilowska, Jolanta B, MD      . lisinopril (PRINIVIL,ZESTRIL) tablet 20 mg  20 mg Oral Daily Pucilowska, Jolanta B, MD   20 mg at 05/31/18 0804  . magnesium hydroxide (MILK OF MAGNESIA) suspension 30 mL  30 mL Oral Daily PRN Pucilowska, Jolanta B, MD      . multivitamin with minerals tablet 1 tablet  1 tablet Oral Daily Pucilowska, Jolanta B, MD   1 tablet at 05/30/18 2137  . nicotine (NICODERM CQ - dosed in mg/24 hours) patch 21 mg  21 mg Transdermal Q0600 Pucilowska, Jolanta B, MD      . oxymetazoline (AFRIN) 0.05 % nasal spray 1 spray  1 spray Each Nare BID Pucilowska, Jolanta B, MD   1 spray at 05/31/18 0803  . polyethylene glycol (MIRALAX / GLYCOLAX) packet 17 g  17 g Oral Daily Pucilowska, Jolanta B, MD   17 g at 05/31/18 0803  . traZODone (DESYREL) tablet 200 mg  200 mg Oral QHS Pucilowska, Jolanta B, MD   200 mg at 05/30/18 2137  . [START ON 06/03/2018] Vitamin D (Ergocalciferol) (DRISDOL) capsule 50,000 Units  50,000 Units Oral Weekly Pucilowska, Jolanta B, MD         Lab Results: No results found for this or any previous visit (from the past 48 hour(s)).  Blood Alcohol level:  Lab  Results  Component Value Date   ETH <10 05/26/2018   ETH <5 03/09/2017    Metabolic Disorder Labs: Lab Results  Component Value Date   HGBA1C 5.2 05/27/2018   MPG 102.54 05/27/2018   MPG 114 10/26/2016   No results found for: PROLACTIN Lab Results  Component Value Date   CHOL 121 05/27/2018   TRIG 70 05/27/2018   HDL 40 (L) 05/27/2018   CHOLHDL 3.0 05/27/2018   VLDL 14 05/27/2018   LDLCALC 67 05/27/2018   LDLCALC 74 04/09/2018    Physical Findings: AIMS:  , ,  ,  ,    CIWA:    COWS:     Musculoskeletal: Strength & Muscle Tone: within normal limits Gait & Station: normal Patient leans: N/A  Psychiatric Specialty Exam: Physical Exam  Nursing note and vitals reviewed. Psychiatric: He has a normal mood and affect. His speech is normal and behavior is normal. Thought content normal. Cognition and memory are normal. He expresses impulsivity.      Blood pressure (!) 138/96, pulse 84, temperature 98.9 F (37.2 C), temperature source Oral, resp. rate 18, height 5\' 9"  (1.753 m), weight 96.6 kg (213 lb), SpO2 98 %.Body mass index is 31.45 kg/m.  General Appearance: Casual  Eye Contact:  Good  Speech:  Clear and Coherent  Volume:  Normal  Mood:  anxious  Affect:  Appropriate, anxious  Thought Process:  Goal Directed and Descriptions of Associations: Intact  Orientation:  Full (Time, Place, and Person)  Thought Content:  Legal issues  Suicidal Thoughts:  denies  Homicidal Thoughts:  No  Memory:  Immediate;   Fair Recent;   Fair Remote;   Fair  Judgement:  Poor  Insight:  Lacking  Psychomotor Activity:  Psychomotor Retardation  Concentration:  Concentration: Fair and Attention Span: Fair  Recall:  [employer] of Knowledge:  Fair  Language:  Fair  Akathisia:  No  Handed:  Right  AIMS (if indicated):     Assets:  Communication Skills Desire  for Improvement Financial Resources/Insurance Resilience  ADL's:  Intact  Cognition:  WNL  Sleep:  Number of Hours: 8.15     Treatment Plan Summary: Daily contact with patient to assess and evaluate symptoms and progress in treatment and Medication management   Mr. Mehringer is a 52 year old male with a history of depression and substance abuse, cocaine use d/o. Pt not willing to continue neurontin.  Cont celexa, d/c neurontin.    Beverly Sessions, MD 05/31/2018, 1:47 PMPatient ID: Roy Register Sr., male   DOB: Mar 11, 1966, 52 y.o.   MRN: [id number]

## 2018-05-31 NOTE — Plan of Care (Signed)
Pt out in the dayroom for most of the evening. Pt still has some swelling in his hands and feet but better than the day before. Pt states he quit taking his Neurontin because it was messing with his manhood. Pt denies SI/HI. Pt is receptive to treatment and safety maintained on unit. Will continue to monitor. Problem: Education: Goal: Knowledge of [place] [facility] Education information/materials will improve Outcome: Progressing Goal: Verbalization of understanding the information provided will improve Outcome: Progressing   Problem: Coping: Goal: Ability to verbalize frustrations and anger appropriately will improve Outcome: Progressing   Problem: Health Behavior/Discharge Planning: Goal: Identification of resources available to assist in meeting health care needs will improve Outcome: Progressing   Problem: Medication: Goal: Compliance with prescribed medication regimen will improve Outcome: Progressing   Problem: Self-Concept: Goal: Ability to disclose and discuss suicidal ideas will improve Outcome: Progressing Goal: Will verbalize positive feelings about self Outcome: Progressing   Problem: Education: Goal: Knowledge of [place] [facility] Education information/materials will improve Outcome: Progressing Goal: Verbalization of understanding the information provided will improve Outcome: Progressing   Problem: Coping: Goal: Ability to verbalize frustrations and anger appropriately will improve Outcome: Progressing   Problem: Health Behavior/Discharge Planning: Goal: Identification of resources available to assist in meeting health care needs will improve Outcome: Progressing   Problem: Medication: Goal: Compliance with prescribed medication regimen will improve Outcome: Progressing   Problem: Self-Concept: Goal: Ability to disclose and discuss suicidal ideas will improve Outcome: Progressing Goal: Will verbalize positive feelings about self Outcome:  Progressing

## 2018-05-31 NOTE — [facility] Group Notes (Signed)
LCSW Group Therapy Note  05/31/2018 1:15pm  Type of Therapy and Topic:  Group Therapy:  Fears and Unhealthy Coping Skills  Participation Level:  Did Not Attend   Description of Group:  The focus of this group was to discuss some of the prevalent fears that patients experience, and to identify the commonalities among group members.  An exercise was used to initiate the discussion, followed by writing on the white board a group-generated list of unhealthy coping and healthy coping techniques to deal with each fear.    Therapeutic Goals: 1. Patient will identify and describe 3 fears they experience 2. Patient will identify one positive coping strategy for each fear they experience 3. Patient will respond empathically to peers statements regarding fears they experience  Summary of Patient Progress:  Pt was invited to attend group but chose not to attend. CSW will continue to encourage pt to attend group throughout their admission.      Therapeutic Modalities Cognitive Behavioral Therapy Motivational Interviewing  Johnnye Sima, LCSW 05/31/2018 4:42 PM

## 2018-05-31 NOTE — Plan of Care (Signed)
Currently in the milieu, cooperative

## 2018-06-01 DIAGNOSIS — F332 Major depressive disorder, recurrent severe without psychotic features: Principal | ICD-10-CM

## 2018-06-01 MED ORDER — TRAZODONE HCL 100 MG PO TABS
300.0000 mg | ORAL_TABLET | Freq: Every day | ORAL | Status: DC
  Administered 2018-06-01 – 2018-06-02 (×2): 300 mg via ORAL
  Filled 2018-06-01 (×2): qty 3

## 2018-06-01 MED ORDER — OXYMETAZOLINE HCL 0.05 % NA SOLN
1.0000 | Freq: Three times a day (TID) | NASAL | Status: DC | PRN
  Administered 2018-06-01 – 2018-06-03 (×4): 1 via NASAL
  Filled 2018-06-01: qty 15

## 2018-06-01 NOTE — Progress Notes (Signed)
Pt reports not needing nicotine patch.

## 2018-06-01 NOTE — [facility] Group Notes (Signed)
LCSW Group Therapy Note 06/01/2018 1:15pm  Type of Therapy and Topic: Group Therapy: Feelings Around Returning Home & Establishing a Supportive Framework and Supporting Oneself When Supports Not Available  Participation Level: Did Not Attend  Description of Group:  Patients first processed thoughts and feelings about upcoming discharge. These included fears of upcoming changes, lack of change, new living environments, judgements and expectations from others and overall stigma of mental health issues. The group then discussed the definition of a supportive framework, what that looks and feels like, and how do to discern it from an unhealthy non-supportive network. The group identified different types of supports as well as what to do when your family/friends are less than helpful or unavailable  Therapeutic Goals  1. Patient will identify one healthy supportive network that they can use at discharge. 2. Patient will identify one factor of a supportive framework and how to tell it from an unhealthy network. 3. Patient able to identify one coping skill to use when they do not have positive supports from others. 4. Patient will demonstrate ability to communicate their needs through discussion and/or role plays.  Summary of Patient Progress:  Pt was invited to attend group but chose not to attend. CSW will continue to encourage pt to attend group throughout their admission.   Therapeutic Modalities Cognitive Behavioral Therapy Motivational Interviewing   Roy [name]  CUEBAS-COLON, LCSW 06/01/2018 12:40 PM 

## 2018-06-01 NOTE — Progress Notes (Signed)
Patient has been  Walking around and has remained cooperative.  Alert and oriented and has no sign of distress. Eating well and compliant with medications. Has just had a snack and staff continue to provide support and encouragements.

## 2018-06-01 NOTE — Progress Notes (Addendum)
[facility] MD Progress Note  06/01/2018 11:29 AM Roy Joyce Gross Maggi Sr.  MRN:  [id number]  Subjective:   I need afrin for cold" Pt asking to increase his afrin , reports poor sleep,.pt anxious about disposition  upon d/c but reports depression better, denies SI/HI.   Principal Problem: Major depressive disorder, recurrent severe without psychotic features (HCC) Diagnosis:   Patient Active Problem List   Diagnosis Date Noted  . Chest pain [R07.9] 05/27/2018  . Major depressive disorder, recurrent severe without psychotic features (HCC) [F33.2] 05/27/2018  . ARF (acute renal failure) (HCC) [N17.9] 04/08/2018  . Cocaine use disorder, moderate, dependence (HCC) [F14.20] 02/19/2017  . Substance induced mood disorder (HCC) [F19.94] 02/19/2017  . HIV disease (HCC) [B20] 10/26/2016  . HTN (hypertension) [I10] 10/26/2016  . Dyslipidemia [E78.5] 10/26/2016  . BPH (benign prostatic hyperplasia) [N40.0] 10/26/2016  . Constipation [K59.00] 10/26/2016   Total Time spent with patient: 25 min  Past Psychiatric History: depression, substance abuse  Past Medical History:  Past Medical History:  Diagnosis Date  . AIDS (acquired immune deficiency syndrome) (HCC)   . Arthritis   . Asthma   . Bipolar disorder (HCC)   . Bronchitis   . Depression   . GERD (gastroesophageal reflux disease)   . Hepatitis C   . HIV (human immunodeficiency virus infection) (HCC)   . HTN (hypertension)     Past Surgical History:  Procedure Laterality Date  . HERNIA REPAIR    . TOE SURGERY     Family History:  Family History  Problem Relation Age of Onset  . Cancer Brother    Family Psychiatric  History: none Social History:  Social History   Substance and Sexual Activity  Alcohol Use Yes  . Alcohol/week: 2.4 oz  . Types: 4 Cans of beer per week     Social History   Substance and Sexual Activity  Drug Use Yes  . Types: Cocaine    Social History   Socioeconomic History  . Marital status: Divorced   Spouse name: Not on file  . Number of children: Not on file  . Years of education: Not on file  . Highest education level: Not on file  Occupational History  . Not on file  Social Needs  . Financial resource strain: Not on file  . Food insecurity:    Worry: Not on file    Inability: Not on file  . Transportation needs:    Medical: Not on file    Non-medical: Not on file  Tobacco Use  . Smoking status: Never Smoker  . Smokeless tobacco: Never Used  Substance and Sexual Activity  . Alcohol use: Yes    Alcohol/week: 2.4 oz    Types: 4 Cans of beer per week  . Drug use: Yes    Types: Cocaine  . Sexual activity: Yes  Lifestyle  . Physical activity:    Days per week: Not on file    Minutes per session: Not on file  . Stress: Not on file  Relationships  . Social connections:    Talks on phone: Not on file    Gets together: Not on file    Attends religious service: Not on file    Active member of club or organization: Not on file    Attends meetings of clubs or organizations: Not on file    Relationship status: Not on file  Other Topics Concern  . Not on file  Social History Narrative  . Not on file  Additional Social History:                         Sleep: Fair  Appetite:  Fair  Current Medications: Current Facility-Administered Medications  Medication Dose Route Frequency Provider Last Rate Last Dose  . acetaminophen (TYLENOL) tablet 650 mg  650 mg Oral Q6H PRN Pucilowska, Jolanta B, MD   650 mg at 05/29/18 2140  . albuterol (PROVENTIL) (2.5 MG/3ML) 0.083% nebulizer solution 3 mL  3 mL Inhalation Q4H PRN Pucilowska, Jolanta B, MD      . alum & mag hydroxide-simeth (MAALOX/MYLANTA) 200-200-20 MG/5ML suspension 30 mL  30 mL Oral Q4H PRN Pucilowska, Jolanta B, MD      . amLODipine (NORVASC) tablet 5 mg  5 mg Oral Daily Pucilowska, Jolanta B, MD   5 mg at 06/01/18 0816  . aspirin EC tablet 81 mg  81 mg Oral Daily Pucilowska, Jolanta B, MD   81 mg at 06/01/18  0816  . atorvastatin (LIPITOR) tablet 40 mg  40 mg Oral q1800 Pucilowska, Jolanta B, MD   40 mg at 05/31/18 1717  . bictegravir-emtricitabine-tenofovir AF (BIKTARVY) 50-200-25 MG per tablet 1 tablet  1 tablet Oral Daily Pucilowska, Jolanta B, MD   1 tablet at 06/01/18 0816  . bisacodyl (DULCOLAX) EC tablet 5 mg  5 mg Oral Daily PRN Pucilowska, Jolanta B, MD      . citalopram (CELEXA) tablet 20 mg  20 mg Oral Daily Pucilowska, Jolanta B, MD   20 mg at 06/01/18 0816  . diclofenac (VOLTAREN) EC tablet 75 mg  75 mg Oral BID Pucilowska, Jolanta B, MD   75 mg at 06/01/18 0816  . docusate sodium (COLACE) capsule 100 mg  100 mg Oral BID Pucilowska, Jolanta B, MD   100 mg at 06/01/18 0816  . feeding supplement (ENSURE ENLIVE) (ENSURE ENLIVE) liquid 237 mL  237 mL Oral BID BM Pucilowska, Jolanta B, MD   237 mL at 05/31/18 1449  . ibuprofen (ADVIL,MOTRIN) tablet 400 mg  400 mg Oral Q6H PRN Pucilowska, Jolanta B, MD      . lisinopril (PRINIVIL,ZESTRIL) tablet 20 mg  20 mg Oral Daily Pucilowska, Jolanta B, MD   20 mg at 06/01/18 0816  . magnesium hydroxide (MILK OF MAGNESIA) suspension 30 mL  30 mL Oral Daily PRN Pucilowska, Jolanta B, MD      . multivitamin with minerals tablet 1 tablet  1 tablet Oral Daily Pucilowska, Jolanta B, MD   1 tablet at 05/31/18 2151  . nicotine (NICODERM CQ - dosed in mg/24 hours) patch 21 mg  21 mg Transdermal Q0600 Pucilowska, Jolanta B, MD      . oxymetazoline (AFRIN) 0.05 % nasal spray 1 spray  1 spray Each Nare BID Pucilowska, Jolanta B, MD   1 spray at 06/01/18 0816  . polyethylene glycol (MIRALAX / GLYCOLAX) packet 17 g  17 g Oral Daily Pucilowska, Jolanta B, MD   17 g at 06/01/18 0817  . traZODone (DESYREL) tablet 200 mg  200 mg Oral QHS Pucilowska, Jolanta B, MD   200 mg at 05/31/18 2150  . [START ON 06/03/2018] Vitamin D (Ergocalciferol) (DRISDOL) capsule 50,000 Units  50,000 Units Oral Weekly Pucilowska, Jolanta B, MD        Lab Results: No results found for this or any  previous visit (from the past 48 hour(s)).  Blood Alcohol level:  Lab Results  Component Value Date   ETH <10 05/26/2018   ETH <  5 03/09/2017    Metabolic Disorder Labs: Lab Results  Component Value Date   HGBA1C 5.2 05/27/2018   MPG 102.54 05/27/2018   MPG 114 10/26/2016   No results found for: PROLACTIN Lab Results  Component Value Date   CHOL 121 05/27/2018   TRIG 70 05/27/2018   HDL 40 (L) 05/27/2018   CHOLHDL 3.0 05/27/2018   VLDL 14 05/27/2018   LDLCALC 67 05/27/2018   LDLCALC 74 04/09/2018    Physical Findings: AIMS:  , ,  ,  ,    CIWA:    COWS:     Musculoskeletal: Strength & Muscle Tone: within normal limits Gait & Station: normal Patient leans: N/A  Psychiatric Specialty Exam: Physical Exam  Nursing note and vitals reviewed. Psychiatric: He has a normal mood and affect. His speech is normal and behavior is normal. Thought content normal. Cognition and memory are normal. He expresses impulsivity.      Blood pressure (!) 131/99, pulse 82, temperature 98.2 F (36.8 C), temperature source Oral, resp. rate 18, height 5\' 9"  (1.753 m), weight 96.6 kg (213 lb), SpO2 100 %.Body mass index is 31.45 kg/m.  General Appearance: Casual  Eye Contact:  Good  Speech:  Clear and Coherent  Volume:  Normal  Mood:  anxious  Affect:  anxious, congruent  Thought Process:  Goal Directed and Descriptions of Associations: Intact  Orientation:  Full (Time, Place, and Person)  Thought Content:  Legal issues  Suicidal Thoughts:  no  Homicidal Thoughts:  No  Memory:  Immediate;   Fair Recent;   Fair Remote;   Fair  Judgement:  Poor  Insight:  Lacking  Psychomotor Activity:  Psychomotor Retardation  Concentration:  Concentration: Fair and Attention Span: Fair  Recall:  [employer] of Knowledge:  Fair  Language:  Fair  Akathisia:  No  Handed:  Right  AIMS (if indicated):     Assets:  Communication Skills Desire for Improvement Financial Resources/Insurance Resilience   ADL's:  Intact  Cognition:  WNL  Sleep:  Number of Hours: 5.5     Treatment Plan Summary: Daily contact with patient to assess and evaluate symptoms and progress in treatment and Medication management   Mr. Holliman is a 52 year old male with a history of depression and substance abuse, cocaine use d/o.  Cont celexa,  Increase afrin. Increase trazodone for sleep.    Beverly Sessions, MD 06/01/2018, 11:29 AMPatient ID: Roy Register Sr., male   DOB: Oct 29, 1966, 53 y.o.   MRN: [id number] Patient ID: Roy Atchley Sr., male   DOB: [birth year], 52 y.o.   MRN: [id number]

## 2018-06-01 NOTE — [facility] Group Notes (Signed)
[facility] Group Notes:  (Nursing/MHT/Case Management/Adjunct)  Date:  06/01/2018  Time:  10:12 PM  Type of Therapy:  Group Therapy  Participation Level:  Active  Participation Quality:  Appropriate  Affect:  Appropriate  Cognitive:  Appropriate  Insight:  Appropriate  Engagement in Group:  Engaged  Modes of Intervention:  Discussion  Summary of Progress/Problems:  Roy [name] 06/01/2018, 10:12 PM

## 2018-06-01 NOTE — Plan of Care (Signed)
In the milieu, cooperative with treatment

## 2018-06-01 NOTE — Plan of Care (Signed)
Patient is alert and oriented x4. Denies SI, HI and AVH. Patient is pleasant and complains of arthritis pain. Patient states his sleep is poor but believes it due to HIV medications. Patient also receives colace and miralax to help with BM. Patient states last BM was on 6/14.  Problem: Education: Goal: Knowledge of [place] [facility] Education information/materials will improve Outcome: Progressing Goal: Mental status will improve Outcome: Progressing Goal: Verbalization of understanding the information provided will improve Outcome: Progressing   Problem: Medication: Goal: Compliance with prescribed medication regimen will improve Outcome: Progressing  Patient only concern is finding a place to go to after discharge. Patient attends groups and is compliant with medications. Nurse will continue to monitor.

## 2018-06-02 MED ORDER — DOLUTEGRAVIR SODIUM 50 MG PO TABS: 50.0000 mg | ORAL_TABLET | Freq: Every day | ORAL | 1 refills | Status: DC

## 2018-06-02 MED ORDER — CITALOPRAM HYDROBROMIDE 20 MG PO TABS: 20.0000 mg | ORAL_TABLET | Freq: Every day | ORAL | 1 refills | Status: DC

## 2018-06-02 MED ORDER — ATORVASTATIN CALCIUM 40 MG PO TABS: 40.0000 mg | ORAL_TABLET | Freq: Every day | ORAL | 1 refills | Status: DC

## 2018-06-02 MED ORDER — LISINOPRIL 20 MG PO TABS: 20.0000 mg | ORAL_TABLET | Freq: Every day | ORAL | 1 refills | Status: DC

## 2018-06-02 MED ORDER — DOCUSATE SODIUM 100 MG PO CAPS
100.0000 mg | ORAL_CAPSULE | Freq: Two times a day (BID) | ORAL | 1 refills | Status: DC
Start: 2018-06-02 — End: 2019-01-22

## 2018-06-02 MED ORDER — POLYETHYLENE GLYCOL 3350 17 G PO PACK: 17.0000 g | PACK | Freq: Every day | ORAL | 1 refills | Status: DC

## 2018-06-02 MED ORDER — OXYMETAZOLINE HCL 0.05 % NA SOLN: 1.0000 | Freq: Two times a day (BID) | NASAL | 1 refills | Status: DC

## 2018-06-02 MED ORDER — AMLODIPINE BESYLATE 5 MG PO TABS: 5.0000 mg | ORAL_TABLET | Freq: Every day | ORAL | 1 refills | Status: DC

## 2018-06-02 MED ORDER — ERGOCALCIFEROL 1.25 MG (50000 UT) PO CAPS: 50000.0000 [IU] | ORAL_CAPSULE | ORAL | 1 refills | Status: DC

## 2018-06-02 MED ORDER — TRAZODONE HCL 300 MG PO TABS: 300.0000 mg | ORAL_TABLET | Freq: Every day | ORAL | 1 refills | Status: DC

## 2018-06-02 MED ORDER — GABAPENTIN 300 MG PO CAPS: 300.0000 mg | ORAL_CAPSULE | Freq: Three times a day (TID) | ORAL | 1 refills | Status: DC

## 2018-06-02 MED ORDER — ASPIRIN EC 81 MG PO TBEC: 81.0000 mg | DELAYED_RELEASE_TABLET | Freq: Every day | ORAL | 1 refills | Status: DC

## 2018-06-02 MED ORDER — ALBUTEROL SULFATE HFA 108 (90 BASE) MCG/ACT IN AERS: 2.0000 | INHALATION_SPRAY | RESPIRATORY_TRACT | 1 refills | Status: DC | PRN

## 2018-06-02 NOTE — BHH Suicide Risk Assessment (Signed)
[facility] Discharge Suicide Risk Assessment   Principal Problem: Major depressive disorder, recurrent severe without psychotic features [facility]) Discharge Diagnoses:  Patient Active Problem List   Diagnosis Date Noted  . Major depressive disorder, recurrent severe without psychotic features (HCC) [F33.2] 05/27/2018    Priority: High  . Chest pain [R07.9] 05/27/2018  . ARF (acute renal failure) (HCC) [N17.9] 04/08/2018  . Cocaine use disorder, moderate, dependence (HCC) [F14.20] 02/19/2017  . Substance induced mood disorder (HCC) [F19.94] 02/19/2017  . HIV disease (HCC) [B20] 10/26/2016  . HTN (hypertension) [I10] 10/26/2016  . Dyslipidemia [E78.5] 10/26/2016  . BPH (benign prostatic hyperplasia) [N40.0] 10/26/2016  . Constipation [K59.00] 10/26/2016    Total Time spent with patient: 20 minutes  Musculoskeletal: Strength & Muscle Tone: within normal limits Gait & Station: normal Patient leans: N/A  Psychiatric Specialty Exam: Review of Systems  Musculoskeletal: Positive for joint pain.  Neurological: Negative.   Psychiatric/Behavioral: Positive for substance abuse.  All other systems reviewed and are negative.   Blood pressure 115/77, pulse 75, temperature 98.5 F (36.9 C), temperature source Oral, resp. rate 18, height 5\' 9"  (1.753 m), weight 96.6 kg (213 lb), SpO2 97 %.Body mass index is 31.45 kg/m.  General Appearance: Casual  Eye Contact::  Good  Speech:  Clear and Coherent409  Volume:  Normal  Mood:  Anxious  Affect:  Appropriate  Thought Process:  Goal Directed and Descriptions of Associations: Intact  Orientation:  Full (Time, Place, and Person)  Thought Content:  WDL  Suicidal Thoughts:  No  Homicidal Thoughts:  No  Memory:  Immediate;   Fair Recent;   Fair Remote;   Fair  Judgement:  Poor  Insight:  Lacking  Psychomotor Activity:  Normal  Concentration:  Fair  Recall:  [employer] of Knowledge:Fair  Language: Fair  Akathisia:  No  Handed:  Right  AIMS (if  indicated):     Assets:  Communication Skills Desire for Improvement Financial Resources/Insurance Resilience  Sleep:  Number of Hours: 6.15  Cognition: WNL  ADL's:  Intact   Mental Status Per Nursing Assessment::   On Admission:  Suicidal ideation indicated by patient, Self-harm thoughts, Suicide plan  Demographic Factors:  Male and Low socioeconomic status  Loss Factors: Legal issues and Financial problems/change in socioeconomic status  Historical Factors: Prior suicide attempts, Family history of mental illness or substance abuse and Impulsivity  Risk Reduction Factors:   Positive therapeutic relationship  Continued Clinical Symptoms:  Depression:   Comorbid alcohol abuse/dependence Impulsivity Alcohol/Substance Abuse/Dependencies  Cognitive Features That Contribute To Risk:  None    Suicide Risk:  Minimal: No identifiable suicidal ideation.  Patients presenting with no risk factors but with morbid ruminations; may be classified as minimal risk based on the severity of the depressive symptoms  Follow-up Information    [facility] Follow up.   Why:  Intake appointments are conducted Monday through Friday from 9A-4P.  Please try to go for an intake appointment with the next 7 days.  Thank you. Contact information: [address] [place] [address] [phone]           Plan Of Care/Follow-up recommendations:  Activity:  as tolerated Diet:  low sodium heart healthy Other:  keep follow up appoitments  Kristine Linea, MD 06/03/2018, 7:56 AM

## 2018-06-02 NOTE — Progress Notes (Signed)
Roy [name] attended group on tonight.  He was sharing, cooperative and respectful to his peers and staff with a large smile on his face. He shared with the group how he will choose to go to [place] as a vacation spot just to change his current scenery.   Annasofia Pohl [place], MHT  06/02/2018

## 2018-06-02 NOTE — Plan of Care (Signed)
Pt concerned about not having medications today, and I assured him he did. Pt denies SI/HI. Pt compliant with medications. Pt is receptive to treatment and safety maintained on unit. Will continue to monitor. Problem: Education: Goal: Knowledge of [place] [facility] Education information/materials will improve Outcome: Progressing Goal: Mental status will improve Outcome: Progressing Goal: Verbalization of understanding the information provided will improve Outcome: Progressing   Problem: Health Behavior/Discharge Planning: Goal: Identification of resources available to assist in meeting health care needs will improve Outcome: Progressing   Problem: Medication: Goal: Compliance with prescribed medication regimen will improve Outcome: Progressing   Problem: Self-Concept: Goal: Will verbalize positive feelings about self Outcome: Progressing

## 2018-06-02 NOTE — Tx Team (Signed)
Interdisciplinary Treatment and Diagnostic Plan Update  06/02/2018 Time of Session: 10:30 AM Roy [name]. MRN: [id number]  Principal Diagnosis: Major depressive disorder, recurrent severe without psychotic features (HCC)  Secondary Diagnoses: Principal Problem:   Major depressive disorder, recurrent severe without psychotic features (HCC) Active Problems:   HIV disease (HCC)   HTN (hypertension)   Dyslipidemia   BPH (benign prostatic hyperplasia)   Constipation   Substance induced mood disorder (HCC)   Current Medications:  Current Facility-Administered Medications  Medication Dose Route Frequency Provider Last Rate Last Dose  . acetaminophen (TYLENOL) tablet 650 mg  650 mg Oral Q6H PRN Pucilowska, Jolanta B, MD   650 mg at 05/29/18 2140  . albuterol (PROVENTIL) (2.5 MG/3ML) 0.083% nebulizer solution 3 mL  3 mL Inhalation Q4H PRN Pucilowska, Jolanta B, MD      . alum & mag hydroxide-simeth (MAALOX/MYLANTA) 200-200-20 MG/5ML suspension 30 mL  30 mL Oral Q4H PRN Pucilowska, Jolanta B, MD      . amLODipine (NORVASC) tablet 5 mg  5 mg Oral Daily Pucilowska, Jolanta B, MD   5 mg at 06/02/18 0804  . aspirin EC tablet 81 mg  81 mg Oral Daily Pucilowska, Jolanta B, MD   81 mg at 06/02/18 0804  . atorvastatin (LIPITOR) tablet 40 mg  40 mg Oral q1800 Pucilowska, Jolanta B, MD   40 mg at 06/01/18 1642  . bictegravir-emtricitabine-tenofovir AF (BIKTARVY) 50-200-25 MG per tablet 1 tablet  1 tablet Oral Daily Pucilowska, Jolanta B, MD   1 tablet at 06/02/18 0913  . bisacodyl (DULCOLAX) EC tablet 5 mg  5 mg Oral Daily PRN Pucilowska, Jolanta B, MD      . citalopram (CELEXA) tablet 20 mg  20 mg Oral Daily Pucilowska, Jolanta B, MD   20 mg at 06/02/18 0804  . diclofenac (VOLTAREN) EC tablet 75 mg  75 mg Oral BID Pucilowska, Jolanta B, MD   75 mg at 06/02/18 0805  . docusate sodium (COLACE) capsule 100 mg  100 mg Oral BID Pucilowska, Jolanta B, MD   100 mg at 06/02/18 0804  . feeding  supplement (ENSURE ENLIVE) (ENSURE ENLIVE) liquid 237 mL  237 mL Oral BID BM Pucilowska, Jolanta B, MD   237 mL at 06/01/18 1439  . ibuprofen (ADVIL,MOTRIN) tablet 400 mg  400 mg Oral Q6H PRN Pucilowska, Jolanta B, MD      . lisinopril (PRINIVIL,ZESTRIL) tablet 20 mg  20 mg Oral Daily Pucilowska, Jolanta B, MD   20 mg at 06/02/18 0804  . magnesium hydroxide (MILK OF MAGNESIA) suspension 30 mL  30 mL Oral Daily PRN Pucilowska, Jolanta B, MD      . multivitamin with minerals tablet 1 tablet  1 tablet Oral Daily Pucilowska, Jolanta B, MD   1 tablet at 06/01/18 2156  . oxymetazoline (AFRIN) 0.05 % nasal spray 1 spray  1 spray Each Nare TID PRN Beverly Sessions, MD   1 spray at 06/02/18 0804  . polyethylene glycol (MIRALAX / GLYCOLAX) packet 17 g  17 g Oral Daily Pucilowska, Jolanta B, MD   17 g at 06/02/18 0804  . traZODone (DESYREL) tablet 300 mg  300 mg Oral QHS Beverly Sessions, MD   300 mg at 06/01/18 2155  . [START ON 06/03/2018] Vitamin D (Ergocalciferol) (DRISDOL) capsule 50,000 Units  50,000 Units Oral Weekly Pucilowska, Jolanta B, MD       PTA Medications: Medications Prior to Admission  Medication Sig Dispense Refill Last Dose  . acetaminophen (TYLENOL) 500  MG tablet Take 1 tablet (500 mg total) by mouth every 8 (eight) hours as needed. 90 tablet 1 prn at prn  . bictegravir-emtricitabine-tenofovir AF (BIKTARVY) 50-200-25 MG TABS tablet Take 1 tablet by mouth daily.   05/26/2018 at Unknown time  . emtricitabine-tenofovir AF (DESCOVY) 200-25 MG tablet Take 1 tablet by mouth daily. (Patient not taking: Reported on 05/26/2018) 30 tablet 1 Not Taking at Unknown time  . meloxicam (MOBIC) 7.5 MG tablet Take 1 tablet (7.5 mg total) by mouth 2 (two) times daily. (Patient not taking: Reported on 05/26/2018) 60 tablet 1 Not Taking at Unknown time  . traZODone (DESYREL) 150 MG tablet Take 1 tablet (150 mg total) by mouth at bedtime. 30 tablet 1 Past Month at Unknown time  . [DISCONTINUED] albuterol  (PROVENTIL HFA;VENTOLIN HFA) 108 (90 Base) MCG/ACT inhaler Inhale 2 puffs into the lungs every 4 (four) hours as needed for shortness of breath. 1 Inhaler 1 prn at prn  . [DISCONTINUED] amLODipine (NORVASC) 5 MG tablet Take 1 tablet (5 mg total) by mouth daily. 30 tablet 1 05/25/2018 at Unknown time  . [DISCONTINUED] aspirin EC 81 MG tablet Take 1 tablet (81 mg total) by mouth daily. 30 tablet 1 05/25/2018 at Unknown time  . [DISCONTINUED] atorvastatin (LIPITOR) 40 MG tablet Take 1 tablet (40 mg total) by mouth daily at 6 PM. 30 tablet 1 Past Month at Unknown time  . [DISCONTINUED] citalopram (CELEXA) 20 MG tablet Take 1 tablet (20 mg total) by mouth daily. 30 tablet 1 05/25/2018 at Unknown time  . [DISCONTINUED] docusate sodium (COLACE) 100 MG capsule Take 1 capsule (100 mg total) by mouth 2 (two) times daily. (Patient not taking: Reported on 05/26/2018) 60 capsule 1 Not Taking at Unknown time  . [DISCONTINUED] dolutegravir (TIVICAY) 50 MG tablet Take 1 tablet (50 mg total) by mouth daily. (Patient not taking: Reported on 05/26/2018) 30 tablet 1 Not Taking at Unknown time  . [DISCONTINUED] ergocalciferol (VITAMIN D2) 50000 units capsule Take 1 capsule (50,000 Units total) by mouth once a week. 4 capsule 1 Past Month at Unknown time  . [DISCONTINUED] gabapentin (NEURONTIN) 300 MG capsule Take 1 capsule (300 mg total) by mouth 3 (three) times daily. 90 capsule 1 Past Month at Unknown time  . [DISCONTINUED] lisinopril (PRINIVIL,ZESTRIL) 20 MG tablet Take 1 tablet (20 mg total) by mouth daily. 30 tablet 1 Past Month at Unknown time  . [DISCONTINUED] oxymetazoline (AFRIN) 0.05 % nasal spray Place 1 spray into both nostrils 2 (two) times daily. 30 mL 1 05/25/2018 at Unknown time  . [DISCONTINUED] polyethylene glycol (MIRALAX / GLYCOLAX) packet Take 17 g by mouth daily. 14 each 1 Past Week at Unknown time    Patient Stressors: Financial difficulties Health problems Legal issue Loss of mother and aunt in the past  year Medication change or noncompliance Other: unable to obtain his medictions  Patient Strengths: Ability for insight Average or above average intelligence Capable of independent living Communication skills Motivation for treatment/growth  Treatment Modalities: Medication Management, Group therapy, Case management,  1 to 1 session with clinician, Psychoeducation, Recreational therapy.   Physician Treatment Plan for Primary Diagnosis: Major depressive disorder, recurrent severe without psychotic features (HCC) Long Term Goal(s): Improvement in symptoms so as ready for discharge Improvement in symptoms so as ready for discharge   Short Term Goals: Ability to identify changes in lifestyle to reduce recurrence of condition will improve Ability to verbalize feelings will improve Ability to disclose and discuss suicidal ideas Ability to demonstrate  self-control will improve Ability to identify and develop effective coping behaviors will improve Ability to maintain clinical measurements within normal limits will improve Compliance with prescribed medications will improve Ability to identify triggers associated with substance abuse/mental health issues will improve Ability to identify changes in lifestyle to reduce recurrence of condition will improve Ability to demonstrate self-control will improve Ability to identify triggers associated with substance abuse/mental health issues will improve  Medication Management: Evaluate patient's response, side effects, and tolerance of medication regimen.  Therapeutic Interventions: 1 to 1 sessions, Unit Group sessions and Medication administration.  Evaluation of Outcomes: Progressing  Physician Treatment Plan for Secondary Diagnosis: Principal Problem:   Major depressive disorder, recurrent severe without psychotic features (HCC) Active Problems:   HIV disease (HCC)   HTN (hypertension)   Dyslipidemia   BPH (benign prostatic hyperplasia)    Constipation   Substance induced mood disorder (HCC)  Long Term Goal(s): Improvement in symptoms so as ready for discharge Improvement in symptoms so as ready for discharge   Short Term Goals: Ability to identify changes in lifestyle to reduce recurrence of condition will improve Ability to verbalize feelings will improve Ability to disclose and discuss suicidal ideas Ability to demonstrate self-control will improve Ability to identify and develop effective coping behaviors will improve Ability to maintain clinical measurements within normal limits will improve Compliance with prescribed medications will improve Ability to identify triggers associated with substance abuse/mental health issues will improve Ability to identify changes in lifestyle to reduce recurrence of condition will improve Ability to demonstrate self-control will improve Ability to identify triggers associated with substance abuse/mental health issues will improve     Medication Management: Evaluate patient's response, side effects, and tolerance of medication regimen.  Therapeutic Interventions: 1 to 1 sessions, Unit Group sessions and Medication administration.  Evaluation of Outcomes: Progressing   RN Treatment Plan for Primary Diagnosis: Major depressive disorder, recurrent severe without psychotic features (HCC) Long Term Goal(s): Knowledge of disease and therapeutic regimen to maintain health will improve  Short Term Goals: Ability to remain free from injury will improve, Ability to disclose and discuss suicidal ideas, Ability to identify and develop effective coping behaviors will improve and Compliance with prescribed medications will improve  Medication Management: RN will administer medications as ordered by provider, will assess and evaluate patient's response and provide education to patient for prescribed medication. RN will report any adverse and/or side effects to prescribing provider.  Therapeutic  Interventions: 1 on 1 counseling sessions, Psychoeducation, Medication administration, Evaluate responses to treatment, Monitor vital signs and CBGs as ordered, Perform/monitor CIWA, COWS, AIMS and Fall Risk screenings as ordered, Perform wound care treatments as ordered.  Evaluation of Outcomes: Progressing   LCSW Treatment Plan for Primary Diagnosis: Major depressive disorder, recurrent severe without psychotic features (HCC) Long Term Goal(s): Safe transition to appropriate next level of care at discharge, Engage patient in therapeutic group addressing interpersonal concerns.  Short Term Goals: Engage patient in aftercare planning with referrals and resources and Increase skills for wellness and recovery  Therapeutic Interventions: Assess for all discharge needs, 1 to 1 time with Social worker, Explore available resources and support systems, Assess for adequacy in community support network, Educate family and significant other(s) on suicide prevention, Complete Psychosocial Assessment, Interpersonal group therapy.  Evaluation of Outcomes: Progressing   Progress in Treatment: Attending groups: No. Participating in groups: No. Taking medication as prescribed: Yes. Toleration medication: Yes. Family/Significant other contact made: No, will contact:  CSW given concent to contact pt's  significant other, [name] Patient understands diagnosis: Yes. Discussing patient identified problems/goals with staff: Yes. Medical problems stabilized or resolved: Yes. Denies suicidal/homicidal ideation: Yes. Issues/concerns per patient self-inventory: No. Other: n/a  New problem(s) identified: No, Describe:  No new problems identified  New Short Term/Long Term Goal(s):  Patient Goals:  Stop feeling like I want to die and my life is worthless.  I want to stop the storm"  Discharge Plan or Barriers: Barriers include pt being homeless and on parole.  He would like to go to the homeless shelter  in [place], but unsure if he can have his parole transferred to this area.  Pt is also interested in residential substance use treatment.  Reason for Continuation of Hospitalization: Anxiety Depression Medication stabilization  Estimated Length of Stay:1 day  Recreational Therapy: Patient Stressors: N/A  Patient Goal: Patient will engage in groups without prompting or encouragement from LRT x3 group sessions within 5 recreation therapy group sessions  Attendees: Patient: Roy [name], [name] 06/02/2018 11:42 AM  Physician: Kristine Linea, MD 06/02/2018 11:42 AM  Nursing: Hulan Amato, RN 06/02/2018 11:42 AM  RN Care Manager: 06/02/2018 11:42 AM  Social Worker:  06/02/2018 11:42 AM  Recreational Therapist: Garret Reddish, LRT 06/02/2018 11:42 AM  Other: Heidi Dach, LCSW 06/02/2018 11:42 AM  Other: Johny Shears, LCSWA 06/02/2018 11:42 AM  Other: Damian Leavell, Chaplain 06/02/2018 11:42 AM    Scribe for Treatment Team: Heidi Dach, LCSW 06/02/2018 11:42 AM

## 2018-06-02 NOTE — [facility] Group Notes (Signed)
LCSW Group Note  06/02/2018  Time: 1PM   Type of Therapy and Topic:  Group Therapy:  Overcoming Obstacles   Participation Level:  Did Not Attend   Description of Group:   In this group patients will be encouraged to explore what they see as obstacles to their own wellness and recovery. They will be guided to discuss their thoughts, feelings, and behaviors related to these obstacles. The group will process together ways to cope with barriers, with attention given to specific choices patients can make. Each patient will be challenged to identify changes they are motivated to make in order to overcome their obstacles. This group will be process-oriented, with patients participating in exploration of their own experiences, giving and receiving support, and processing challenge from other group members.   Therapeutic Goals: 1. Patient will identify personal and current obstacles as they relate to admission. 2. Patient will identify barriers that currently interfere with their wellness or overcoming obstacles.  3. Patient will identify feelings, thought process and behaviors related to these barriers. 4. Patient will identify two changes they are willing to make to overcome these obstacles:      Summary of Patient Progress  Pt was invited to attend group but chose not to attend. CSW will continue to encourage pt to attend group throughout their admission.     Therapeutic Modalities:   Cognitive Behavioral Therapy Solution Focused Therapy Motivational Interviewing Relapse Prevention Therapy  Heidi Dach, MSW, LCSW Clinical Social Worker 06/02/2018 1:49 PM

## 2018-06-02 NOTE — Plan of Care (Signed)
Patient aware of [facility] [place] Information  regarding programing .   Mental status improving  with emotional. Working on coping  skills and anger management  . Compliant  with  medications and able to verbalize understanding of them. Denies  suicidal ideations . Voice no concerns around eliminations .   Problem: Education: Goal: Knowledge of [place] [facility] Education information/materials will improve Outcome: Progressing Goal: Mental status will improve Outcome: Progressing Goal: Verbalization of understanding the information provided will improve Outcome: Progressing   Problem: Coping: Goal: Ability to verbalize frustrations and anger appropriately will improve Outcome: Progressing   Problem: Health Behavior/Discharge Planning: Goal: Identification of resources available to assist in meeting health care needs will improve Outcome: Progressing   Problem: Medication: Goal: Compliance with prescribed medication regimen will improve Outcome: Progressing   Problem: Self-Concept: Goal: Ability to disclose and discuss suicidal ideas will improve Outcome: Progressing Goal: Will verbalize positive feelings about self Outcome: Progressing   Problem: Elimination: Goal: Will not experience complications related to bowel motility Outcome: Progressing

## 2018-06-02 NOTE — Progress Notes (Signed)
Recreation Therapy Notes  Date: 06/02/2018  Time: 9:30 am   Location: Craft Room   Behavioral response: N/A   Intervention Topic: Happiness  Discussion/Intervention: Patient did not attend group.   Clinical Observations/Feedback:  Patient did not attend group.   Tranell Wojtkiewicz LRT/CTRS        Jayelle Page 06/02/2018 10:26 AM

## 2018-06-02 NOTE — [facility] Suicide Risk Assessment (Signed)
[facility] INPATIENT:  Family/Significant Other Suicide Prevention Education  Suicide Prevention Education:  Contact Attempts: [name] (gf) at [phone] has been identified by the patient as the family member/significant other with whom the patient will be residing, and identified as the person(s) who will aid the patient in the event of a mental health crisis.  With written consent from the patient, two attempts were made to provide suicide prevention education, prior to and/or following the patient's discharge.  We were unsuccessful in providing suicide prevention education.  A suicide education pamphlet was given to the patient to share with family/significant other.  Date and time of first attempt: 06/02/18 at 0930 Date and time of second attempt: 06/02/18 at 2:00PM Heidi Dach, LCSW 06/02/2018, 2:27 PM

## 2018-06-02 NOTE — Progress Notes (Signed)
D: Patient stated slept poor last night .Stated appetite  ( eating everything on tray )and energy level  Is normal. Stated concentration poor . Stated on Depression scale 0, hopeless 0 and anxiety 0 .( low 0-10 high) Denies suicidal  homicidal ideations  .  No auditory hallucinations  No pain concerns . Appropriate ADL'S. Interacting with peers and staff.  Patient aware of [place] Information regarding programing . Mental status improving with emotional. Working on coping skills and anger management . Compliant with medications and able to verbalize understanding of them. Denies suicidal ideations . Voice no concerns around eliminations .  A: Encourage patient participation with unit programming . Instruction  Given on  Medication , verbalize understanding.  R: Voice no other concerns. Staff continue to monitor

## 2018-06-02 NOTE — Progress Notes (Signed)
[facility] MD Progress Note  06/02/2018 12:17 PM Roy Joyce Gross Lechuga Sr.  MRN:  [id number]  Subjective:    Roy [name] is rather anxious and upset today to discover that there has been arrest warrant out and that Roy [name] will be going to jail for at least 4-5 months. We planned on discharging him today. Roy [name] will be transported tomorrow to jail by [employer] so Roy [name] can continue on his medications.   Roy [name] denies suicidal or homicidal ideation and tolerates medications well.   Principal Problem: Major depressive disorder, recurrent severe without psychotic features (HCC) Diagnosis:   Patient Active Problem List   Diagnosis Date Noted  . Major depressive disorder, recurrent severe without psychotic features (HCC) [F33.2] 05/27/2018    Priority: High  . Chest pain [R07.9] 05/27/2018  . ARF (acute renal failure) (HCC) [N17.9] 04/08/2018  . Cocaine use disorder, moderate, dependence (HCC) [F14.20] 02/19/2017  . Substance induced mood disorder (HCC) [F19.94] 02/19/2017  . HIV disease (HCC) [B20] 10/26/2016  . HTN (hypertension) [I10] 10/26/2016  . Dyslipidemia [E78.5] 10/26/2016  . BPH (benign prostatic hyperplasia) [N40.0] 10/26/2016  . Constipation [K59.00] 10/26/2016   Total Time spent with patient: 15 minutes  Past Psychiatric History: depression  Past Medical History:  Past Medical History:  Diagnosis Date  . AIDS (acquired immune deficiency syndrome) (HCC)   . Arthritis   . Asthma   . Bipolar disorder (HCC)   . Bronchitis   . Depression   . GERD (gastroesophageal reflux disease)   . Hepatitis C   . HIV (human immunodeficiency virus infection) (HCC)   . HTN (hypertension)     Past Surgical History:  Procedure Laterality Date  . HERNIA REPAIR    . TOE SURGERY     Family History:  Family History  Problem Relation Age of Onset  . Cancer Brother    Family Psychiatric  History: none Social History:  Social History   Substance and Sexual Activity  Alcohol Use Yes  .  Alcohol/week: 2.4 oz  . Types: 4 Cans of beer per week     Social History   Substance and Sexual Activity  Drug Use Yes  . Types: Cocaine    Social History   Socioeconomic History  . Marital status: Divorced    Spouse name: Not on file  . Number of children: Not on file  . Years of education: Not on file  . Highest education level: Not on file  Occupational History  . Not on file  Social Needs  . Financial resource strain: Not on file  . Food insecurity:    Worry: Not on file    Inability: Not on file  . Transportation needs:    Medical: Not on file    Non-medical: Not on file  Tobacco Use  . Smoking status: Never Smoker  . Smokeless tobacco: Never Used  Substance and Sexual Activity  . Alcohol use: Yes    Alcohol/week: 2.4 oz    Types: 4 Cans of beer per week  . Drug use: Yes    Types: Cocaine  . Sexual activity: Yes  Lifestyle  . Physical activity:    Days per week: Not on file    Minutes per session: Not on file  . Stress: Not on file  Relationships  . Social connections:    Talks on phone: Not on file    Gets together: Not on file    Attends religious service: Not on file    Active member of club  or organization: Not on file    Attends meetings of clubs or organizations: Not on file    Relationship status: Not on file  Other Topics Concern  . Not on file  Social History Narrative  . Not on file   Additional Social History:                         Sleep: Fair  Appetite:  Fair  Current Medications: Current Facility-Administered Medications  Medication Dose Route Frequency Provider Last Rate Last Dose  . acetaminophen (TYLENOL) tablet 650 mg  650 mg Oral Q6H PRN Anarely Nicholls B, MD   650 mg at 05/29/18 2140  . albuterol (PROVENTIL) (2.5 MG/3ML) 0.083% nebulizer solution 3 mL  3 mL Inhalation Q4H PRN Ajee Heasley B, MD      . alum & mag hydroxide-simeth (MAALOX/MYLANTA) 200-200-20 MG/5ML suspension 30 mL  30 mL Oral Q4H PRN  Tyronda Vizcarrondo B, MD      . amLODipine (NORVASC) tablet 5 mg  5 mg Oral Daily Kanitra Purifoy B, MD   5 mg at 06/02/18 0804  . aspirin EC tablet 81 mg  81 mg Oral Daily Donnesha Karg B, MD   81 mg at 06/02/18 0804  . atorvastatin (LIPITOR) tablet 40 mg  40 mg Oral q1800 Andrue Dini B, MD   40 mg at 06/01/18 1642  . bictegravir-emtricitabine-tenofovir AF (BIKTARVY) 50-200-25 MG per tablet 1 tablet  1 tablet Oral Daily Shenika Quint B, MD   1 tablet at 06/02/18 0913  . bisacodyl (DULCOLAX) EC tablet 5 mg  5 mg Oral Daily PRN Ziyana Morikawa B, MD      . citalopram (CELEXA) tablet 20 mg  20 mg Oral Daily Kamill Fulbright B, MD   20 mg at 06/02/18 0804  . diclofenac (VOLTAREN) EC tablet 75 mg  75 mg Oral BID Sundus Pete B, MD   75 mg at 06/02/18 0805  . docusate sodium (COLACE) capsule 100 mg  100 mg Oral BID Clebert Wenger B, MD   100 mg at 06/02/18 0804  . feeding supplement (ENSURE ENLIVE) (ENSURE ENLIVE) liquid 237 mL  237 mL Oral BID BM Riad Wagley B, MD   237 mL at 06/01/18 1439  . ibuprofen (ADVIL,MOTRIN) tablet 400 mg  400 mg Oral Q6H PRN Domonique Brouillard B, MD      . lisinopril (PRINIVIL,ZESTRIL) tablet 20 mg  20 mg Oral Daily Desmin Daleo B, MD   20 mg at 06/02/18 0804  . magnesium hydroxide (MILK OF MAGNESIA) suspension 30 mL  30 mL Oral Daily PRN Mohmmad Saleeby B, MD      . multivitamin with minerals tablet 1 tablet  1 tablet Oral Daily Debbera Wolken B, MD   1 tablet at 06/01/18 2156  . oxymetazoline (AFRIN) 0.05 % nasal spray 1 spray  1 spray Each Nare TID PRN Beverly Sessions, MD   1 spray at 06/02/18 0804  . polyethylene glycol (MIRALAX / GLYCOLAX) packet 17 g  17 g Oral Daily Nanetta Wiegman B, MD   17 g at 06/02/18 0804  . traZODone (DESYREL) tablet 300 mg  300 mg Oral QHS Beverly Sessions, MD   300 mg at 06/01/18 2155  . [START ON 06/03/2018] Vitamin D (Ergocalciferol) (DRISDOL) capsule 50,000 Units   50,000 Units Oral Weekly Vaanya Shambaugh B, MD        Lab Results: No results found for this or any previous visit (from the past 48 hour(s)).  Blood Alcohol level:  Lab Results  Component Value Date   ETH <10 05/26/2018   ETH <5 03/09/2017    Metabolic Disorder Labs: Lab Results  Component Value Date   HGBA1C 5.2 05/27/2018   MPG 102.54 05/27/2018   MPG 114 10/26/2016   No results found for: PROLACTIN Lab Results  Component Value Date   CHOL 121 05/27/2018   TRIG 70 05/27/2018   HDL 40 (L) 05/27/2018   CHOLHDL 3.0 05/27/2018   VLDL 14 05/27/2018   LDLCALC 67 05/27/2018   LDLCALC 74 04/09/2018    Physical Findings: AIMS:  , ,  ,  ,    CIWA:    COWS:     Musculoskeletal: Strength & Muscle Tone: within normal limits Gait & Station: normal Patient leans: N/A  Psychiatric Specialty Exam: Physical Exam  Nursing note and vitals reviewed. Psychiatric: His speech is normal and behavior is normal. Thought content normal. His mood appears anxious. Cognition and memory are normal. Roy [name] expresses impulsivity.    Review of Systems  Musculoskeletal: Positive for joint pain.  Neurological: Negative.   Psychiatric/Behavioral: The patient is nervous/anxious.   All other systems reviewed and are negative.   Blood pressure 114/87, pulse (!) 111, temperature 99.1 F (37.3 C), temperature source Oral, resp. rate 18, height 5\' 9"  (1.753 m), weight 96.6 kg (213 lb), SpO2 95 %.Body mass index is 31.45 kg/m.  General Appearance: Casual  Eye Contact:  Good  Speech:  Clear and Coherent  Volume:  Normal  Mood:  Euthymic  Affect:  Appropriate  Thought Process:  Goal Directed and Descriptions of Associations: Intact  Orientation:  Full (Time, Place, and Person)  Thought Content:  WDL  Suicidal Thoughts:  No  Homicidal Thoughts:  No  Memory:  Immediate;   Fair Recent;   Fair Remote;   Fair  Judgement:  Poor  Insight:  Shallow  Psychomotor Activity:  Normal  Concentration:   Concentration: Fair and Attention Span: Fair  Recall:  [employer] of Knowledge:  Fair  Language:  Fair  Akathisia:  No  Handed:  Right  AIMS (if indicated):     Assets:  Communication Skills Desire for Improvement Financial Resources/Insurance Resilience  ADL's:  Intact  Cognition:  WNL  Sleep:  Number of Hours: 6.15     Treatment Plan Summary: Daily contact with patient to assess and evaluate symptoms and progress in treatment and Medication management   Roy [name] is a 52 year old male with a history of depression and substance abuse transferred from medical floor, where Roy [name] was hospitalized for chest pain, in the context of severe social stressors and relapse on cocaine. Roy [name] realized today, that Roy [name] will return to jail for probation violation.  #Suicidal ideation, resolved  #Mood, improved -continue Celexa 20 mg daily -continue Trazodone 300 mg nightly  #Substance abuse -positive for cocaine and cannabis -unable to participate in rehab due to legal situation  #HIV -Biktarvy daily  #HTN -continue Norvasc 5 mg and Lisinopril 20 mg  #Dyslipidemia -continue ASA 81 mg and Lipitor 40 mg  #Nasal congestion -Afrin PRN  #Weight loss -Ensure TID and MVI  #Constipation -continue bowel regimen  #Arthritis -Neurontin 300 mg TID -start Voltaren 75 mg BID  #Smoking cessation -nicotine patch is available  #Disposition -arrest warrant out, going to jail  -follow up with [facility] clinic and [facility]    Kristine Linea, MD 06/02/2018, 12:17 PM

## 2018-06-03 NOTE — Unmapped (Signed)
PATIENT HAS HMAP,AND CAN NOT FILL BICTAVY @ [facility] HE HAS TO FILL WITH [facility]

## 2018-06-03 NOTE — Unmapped (Signed)
[facility] Refill Coordination Note    Specialty Medication(s) to be Shipped:   Infectious Disease: Biktarvy    Other medication(s) to be shipped:       Samuel Baker, DOB: Apr 18, 1966  Phone: [phone] (home)   Shipping Address: [address] [place]  [place] [address]    All above HIPAA information was verified with patient's caregiver.     Completed refill call assessment today to schedule patient's medication shipment from the [facility] [phone]).       Specialty medication(s) and dose(s) confirmed: Regimen is correct and unchanged.   Changes to medications: Samuel Baker reports no changes reported at this time.  Changes to insurance: No  Questions for the pharmacist: No    The patient will receive an FSI print out for each medication shipped and additional FDA Medication Guides as required.  Patient education from Samuel Baker or Samuel Baker may also be included in the shipment.    DISEASE-SPECIFIC INFORMATION        N/A    ADHERENCE              MEDICARE PART B DOCUMENTATION         SHIPPING     Shipping address confirmed in FSI.     Delivery Scheduled: Yes, Expected medication delivery date: 062019 via UPS or courier.     Samuel Baker   [facility] Specialty Technician

## 2018-06-03 NOTE — Progress Notes (Signed)
D: Patient is aware of  Discharge this shift .Patient denies suicidal /homicidal ideations. Patient received all belongings brought in  A: No Storage medications. Writer reviewed Discharge Summary, Suicide Risk Assessment, and Transitional Record. Patient also received Prescriptions   from  MD. A 30 day supply of medications and 4 day supply of HIV medications  given to sheriff  R: Patient left unit with no questions  Or concerns  With sheriff

## 2018-06-03 NOTE — [facility] Group Notes (Signed)
CSW Group Therapy Note  06/03/2018  Time:  0900  Type of Therapy and Topic: Group Therapy: Goals Group: SMART Goals    Participation Level:  Did Not Attend    Description of Group:   The purpose of a daily goals group is to assist and guide patients in setting recovery/wellness-related goals. The objective is to set goals as they relate to the crisis in which they were admitted. Patients will be using SMART goal modalities to set measurable goals. Characteristics of realistic goals will be discussed and patients will be assisted in setting and processing how one will reach their goal. Facilitator will also assist patients in applying interventions and coping skills learned in psycho-education groups to the SMART goal and process how one will achieve defined goal.    Therapeutic Goals:  -Patients will develop and document one goal related to or their crisis in which brought them into treatment.  -Patients will be guided by LCSW using SMART goal setting modality in how to set a measurable, attainable, realistic and time sensitive goal.  -Patients will process barriers in reaching goal.  -Patients will process interventions in how to overcome and successful in reaching goal.    [name] Goal:  Pt was invited to attend group but chose not to attend. CSW will continue to encourage pt to attend group throughout their admission.   Therapeutic Modalities:  Motivational Interviewing  Cognitive Behavioral Therapy  Crisis Intervention Model  SMART goals setting  Heidi Dach, MSW, LCSW Clinical Social Worker 06/03/2018 9:32 AM

## 2018-06-03 NOTE — Progress Notes (Signed)
CH was rounding on [facility], came across [name] whom was about to be d/c and likely headed back to [facility] for 3 - 4 months. He seemed to be in fair spirits, because he was struggling to survive since being released due to lack of social support.Pastoral presence ensued, he was encouraged to remain hopeful and prior to our departure salutations hoping that many blessings would be bestowed upon him.   06/03/18 1200  Clinical Encounter Type  Visited With Patient  Visit Type Follow-up  Referral From Other (Comment) (rounding on unit)  Spiritual Encounters  Spiritual Needs Emotional  Stress Factors  Patient Stress Factors Major life changes

## 2018-06-03 NOTE — Progress Notes (Addendum)
  [facility] Adult Case Management Discharge Plan :  Will you be returning to the same living situation after discharge:  No. At discharge, do you have transportation home?: Yes,  police. Do you have the ability to pay for your medications: Yes,  30 day supply  Release of information consent forms completed and in the chart;  [name] signature needed at discharge.  Patient to Follow up at: Follow-up Information    [facility] Follow up.   Why:  Intake appointments are conducted Monday through Friday from 9A-4P.  Please try to go for an intake appointment within the next 7 days.  Thank you. Contact information: [address] [place] [address] [phone]           Next level of care provider has access to [facility] Link:no  Safety Planning and Suicide Prevention discussed: Yes,  with pt and attempted x 2 with pt's girlfriend.  Have you used any form of tobacco in the last 30 days? (Cigarettes, Smokeless Tobacco, Cigars, and/or Pipes): No  Has patient been referred to the Quitline?: N/A patient is not a smoker  Patient has been referred for addiction treatment: N/A  Heidi Dach, LCSW 06/03/2018, 8:11 AM
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
## 2018-12-17 DIAGNOSIS — I219 Acute myocardial infarction, unspecified: Secondary | ICD-10-CM

## 2018-12-17 HISTORY — DX: Acute myocardial infarction, unspecified: I21.9

## 2018-12-31 ENCOUNTER — Ambulatory Visit: Admit: 2018-12-31 | Discharge: 2019-01-01 | Payer: MEDICAID

## 2018-12-31 DIAGNOSIS — I10 Essential (primary) hypertension: Secondary | ICD-10-CM

## 2018-12-31 DIAGNOSIS — B20 Human immunodeficiency virus [HIV] disease: Principal | ICD-10-CM

## 2019-01-14 ENCOUNTER — Ambulatory Visit: Admit: 2019-01-14 | Discharge: 2019-01-15

## 2019-01-14 DIAGNOSIS — I10 Essential (primary) hypertension: Secondary | ICD-10-CM

## 2019-01-14 DIAGNOSIS — B20 Human immunodeficiency virus [HIV] disease: Principal | ICD-10-CM

## 2019-01-14 DIAGNOSIS — Z720 Tobacco use: Secondary | ICD-10-CM

## 2019-01-22 ENCOUNTER — Emergency Department: Payer: Medicaid Other

## 2019-01-22 ENCOUNTER — Other Ambulatory Visit: Payer: Self-pay

## 2019-01-22 ENCOUNTER — Observation Stay
Admission: EM | Admit: 2019-01-22 | Discharge: 2019-01-24 | Disposition: A | Discharge status: Home / Self Care | Payer: Medicaid Other | Attending: Specialist | Admitting: Specialist

## 2019-01-22 ENCOUNTER — Encounter: Payer: Self-pay | Admitting: *Deleted

## 2019-01-22 DIAGNOSIS — K219 Gastro-esophageal reflux disease without esophagitis: Secondary | ICD-10-CM | POA: Diagnosis not present

## 2019-01-22 DIAGNOSIS — F319 Bipolar disorder, unspecified: Secondary | ICD-10-CM | POA: Insufficient documentation

## 2019-01-22 DIAGNOSIS — Z79899 Other long term (current) drug therapy: Secondary | ICD-10-CM | POA: Diagnosis not present

## 2019-01-22 DIAGNOSIS — M1990 Unspecified osteoarthritis, unspecified site: Secondary | ICD-10-CM | POA: Diagnosis not present

## 2019-01-22 DIAGNOSIS — I4430 Unspecified atrioventricular block: Secondary | ICD-10-CM | POA: Insufficient documentation

## 2019-01-22 DIAGNOSIS — R9431 Abnormal electrocardiogram [ECG] [EKG]: Secondary | ICD-10-CM | POA: Diagnosis not present

## 2019-01-22 DIAGNOSIS — E785 Hyperlipidemia, unspecified: Secondary | ICD-10-CM | POA: Insufficient documentation

## 2019-01-22 DIAGNOSIS — B1920 Unspecified viral hepatitis C without hepatic coma: Secondary | ICD-10-CM | POA: Insufficient documentation

## 2019-01-22 DIAGNOSIS — N400 Enlarged prostate without lower urinary tract symptoms: Secondary | ICD-10-CM | POA: Insufficient documentation

## 2019-01-22 DIAGNOSIS — I10 Essential (primary) hypertension: Secondary | ICD-10-CM | POA: Diagnosis not present

## 2019-01-22 DIAGNOSIS — R001 Bradycardia, unspecified: Secondary | ICD-10-CM | POA: Insufficient documentation

## 2019-01-22 DIAGNOSIS — R0602 Shortness of breath: Secondary | ICD-10-CM

## 2019-01-22 DIAGNOSIS — R079 Chest pain, unspecified: Secondary | ICD-10-CM | POA: Diagnosis not present

## 2019-01-22 DIAGNOSIS — Z8249 Family history of ischemic heart disease and other diseases of the circulatory system: Secondary | ICD-10-CM | POA: Diagnosis not present

## 2019-01-22 DIAGNOSIS — J45909 Unspecified asthma, uncomplicated: Secondary | ICD-10-CM | POA: Diagnosis not present

## 2019-01-22 DIAGNOSIS — F419 Anxiety disorder, unspecified: Secondary | ICD-10-CM | POA: Insufficient documentation

## 2019-01-22 DIAGNOSIS — F1410 Cocaine abuse, uncomplicated: Secondary | ICD-10-CM | POA: Insufficient documentation

## 2019-01-22 DIAGNOSIS — B20 Human immunodeficiency virus [HIV] disease: Secondary | ICD-10-CM | POA: Insufficient documentation

## 2019-01-22 LAB — BASIC METABOLIC PANEL
ANION GAP: 5 (ref 5–15)
BUN: 19 mg/dL (ref 6–20)
CO2: 28 mmol/L (ref 22–32)
Calcium: 9.3 mg/dL (ref 8.9–10.3)
Chloride: 107 mmol/L (ref 98–111)
Creatinine, Ser: 1.3 mg/dL — ABNORMAL HIGH (ref 0.61–1.24)
GFR calc Af Amer: >60 mL/min (ref >60)
Glucose, Bld: 115 mg/dL — ABNORMAL HIGH (ref 70–99)
Potassium: 3.6 mmol/L (ref 3.5–5.1)
Sodium: 140 mmol/L (ref 135–145)

## 2019-01-22 LAB — CBC
HCT: 40.9 % (ref 39.0–52.0)
Hemoglobin: 14 g/dL (ref 13.0–17.0)
MCH: 32.6 pg (ref 26.0–34.0)
MCHC: 34.2 g/dL (ref 30.0–36.0)
MCV: 95.1 fL (ref 80.0–100.0)
Platelets: 235 10*3/uL (ref 150–400)
RBC: 4.3 MIL/uL (ref 4.22–5.81)
RDW: 12.4 % (ref 11.5–15.5)
WBC: 4.5 10*3/uL (ref 4.0–10.5)
nRBC: 0 % (ref 0.0–0.2)

## 2019-01-22 LAB — TROPONIN I
Troponin I: <0.03 ng/mL (ref <0.03)
Troponin I: <0.03 ng/mL (ref <0.03)

## 2019-01-22 MED ORDER — ASPIRIN 81 MG PO CHEW
324.0000 mg | CHEWABLE_TABLET | Freq: Once | ORAL | Status: AC
  Administered 2019-01-22: 324 mg via ORAL
  Filled 2019-01-22: qty 4

## 2019-01-22 MED ORDER — SERTRALINE HCL 50 MG PO TABS
75.0000 mg | ORAL_TABLET | Freq: Every day | ORAL | Status: DC
  Administered 2019-01-23 – 2019-01-24 (×2): 75 mg via ORAL
  Filled 2019-01-22: qty 2
  Filled 2019-01-22: qty 1.5
  Filled 2019-01-22: qty 2

## 2019-01-22 MED ORDER — ATORVASTATIN CALCIUM 20 MG PO TABS
40.0000 mg | ORAL_TABLET | Freq: Every day | ORAL | Status: DC
  Administered 2019-01-22 – 2019-01-23 (×2): 40 mg via ORAL
  Filled 2019-01-22 (×3): qty 2

## 2019-01-22 MED ORDER — ONDANSETRON HCL 4 MG/2ML IJ SOLN: 4.0000 mg | Freq: Four times a day (QID) | INTRAMUSCULAR | Status: DC | PRN

## 2019-01-22 MED ORDER — SODIUM CHLORIDE 0.9 % IV SOLN
INTRAVENOUS | Status: DC
  Administered 2019-01-22 – 2019-01-23 (×3): via INTRAVENOUS

## 2019-01-22 MED ORDER — LACTASE 3000 UNITS PO TABS
3000.0000 [IU] | ORAL_TABLET | Freq: Three times a day (TID) | ORAL | Status: DC
  Administered 2019-01-23 – 2019-01-24 (×5): 3000 [IU] via ORAL
  Filled 2019-01-22 (×7): qty 1

## 2019-01-22 MED ORDER — ACETAMINOPHEN 325 MG PO TABS
650.0000 mg | ORAL_TABLET | Freq: Four times a day (QID) | ORAL | Status: DC | PRN
  Administered 2019-01-23: 650 mg via ORAL
  Filled 2019-01-22 (×2): qty 2

## 2019-01-22 MED ORDER — LACTULOSE 10 GM/15ML PO SOLN
20.0000 g | Freq: Two times a day (BID) | ORAL | Status: DC | PRN
  Filled 2019-01-22: qty 30

## 2019-01-22 MED ORDER — ACETAMINOPHEN 650 MG RE SUPP
650.0000 mg | Freq: Four times a day (QID) | RECTAL | Status: DC | PRN
  Filled 2019-01-22: qty 1

## 2019-01-22 MED ORDER — OXYCODONE HCL 5 MG PO TABS: 5.0000 mg | ORAL_TABLET | Freq: Four times a day (QID) | ORAL | Status: DC | PRN

## 2019-01-22 MED ORDER — GABAPENTIN 300 MG PO CAPS
300.0000 mg | ORAL_CAPSULE | Freq: Three times a day (TID) | ORAL | Status: DC
  Administered 2019-01-22 – 2019-01-24 (×5): 300 mg via ORAL
  Filled 2019-01-22 (×4): qty 1

## 2019-01-22 MED ORDER — NITROGLYCERIN 0.4 MG SL SUBL
0.4000 mg | SUBLINGUAL_TABLET | SUBLINGUAL | Status: DC | PRN
  Administered 2019-01-22 (×2): 0.4 mg via SUBLINGUAL
  Filled 2019-01-22: qty 1

## 2019-01-22 MED ORDER — TRAZODONE HCL 100 MG PO TABS
300.0000 mg | ORAL_TABLET | Freq: Every day | ORAL | Status: DC
  Administered 2019-01-22 – 2019-01-23 (×2): 300 mg via ORAL
  Filled 2019-01-22 (×2): qty 3

## 2019-01-22 MED ORDER — SODIUM CHLORIDE 0.9% FLUSH
3.0000 mL | Freq: Once | INTRAVENOUS | Status: AC
  Administered 2019-01-22: 3 mL via INTRAVENOUS

## 2019-01-22 MED ORDER — OXYMETAZOLINE HCL 0.05 % NA SOLN
1.0000 | Freq: Two times a day (BID) | NASAL | Status: DC
  Administered 2019-01-22 – 2019-01-24 (×4): 1 via NASAL
  Filled 2019-01-22: qty 15

## 2019-01-22 MED ORDER — ONDANSETRON HCL 4 MG PO TABS
4.0000 mg | ORAL_TABLET | Freq: Four times a day (QID) | ORAL | Status: DC | PRN
  Filled 2019-01-22: qty 1

## 2019-01-22 MED ORDER — ALBUTEROL SULFATE (2.5 MG/3ML) 0.083% IN NEBU: 2.5000 mg | INHALATION_SOLUTION | RESPIRATORY_TRACT | Status: DC | PRN

## 2019-01-22 MED ORDER — ENOXAPARIN SODIUM 40 MG/0.4ML ~~LOC~~ SOLN
40.0000 mg | SUBCUTANEOUS | Status: DC
  Administered 2019-01-22 – 2019-01-23 (×2): 40 mg via SUBCUTANEOUS
  Filled 2019-01-22: qty 0.4

## 2019-01-22 MED ORDER — BICTEGRAVIR-EMTRICITAB-TENOFOV 50-200-25 MG PO TABS
1.0000 | ORAL_TABLET | Freq: Every day | ORAL | Status: DC
  Administered 2019-01-23 – 2019-01-24 (×2): 1 via ORAL
  Filled 2019-01-22 (×2): qty 1

## 2019-01-22 NOTE — ED Notes (Signed)
Cole RN, aware of bed assigned  

## 2019-01-22 NOTE — ED Notes (Signed)
Off unit to xray

## 2019-01-22 NOTE — ED Notes (Signed)
ED TO INPATIENT HANDOFF REPORT  Name/Age/Gender Roy Liter Zara Sr. 53 y.o. male  Code Status    Code Status Orders  (From admission, onward)         Start     Ordered   01/22/19 1736  Full code  Continuous     01/22/19 1735        Code Status History    Date Active Date Inactive Code Status Order ID Comments User Context   05/27/2018 1645 06/03/2018 1526 Full Code [id number]  Shari Prows, MD Inpatient   05/27/2018 0108 05/27/2018 1558 Full Code [id number]  Cammy Copa, MD ED   04/08/2018 2008 04/10/2018 1334 Full Code [id number]  Shaune Pollack, MD Inpatient   02/20/2017 1725 02/27/2017 2237 Full Code [id number]  Audery Amel, MD Inpatient   10/25/2016 2038 10/29/2016 2139 Full Code [id number]  Clapacs, Jackquline Denmark, MD Inpatient      Home/SNF/Other Home  Chief Complaint Chest Pain   Level of Care/Admitting Diagnosis ED Disposition    ED Disposition Condition Comment   Admit  Hospital Area: [facility] [[id number]]  Level of Care: Telemetry [5]  Diagnosis: Chest pain [975300]  Admitting Physician: Alford Highland [[id number]]  Attending Physician: Alford Highland [phone]  PT Class (Do Not Modify): Observation [104]  PT Acc Code (Do Not Modify): Observation [10022]       Medical History Past Medical History:  Diagnosis Date  . AIDS (acquired immune deficiency syndrome) (HCC)   . Arthritis   . Asthma   . Bipolar disorder (HCC)   . Bronchitis   . Depression   . GERD (gastroesophageal reflux disease)   . Hepatitis C   . HIV (human immunodeficiency virus infection) (HCC)   . HTN (hypertension)     Allergies Allergies  Allergen Reactions  . Lactose Other (See Comments)    GI distress  . Pollen Extract Itching    IV Location/Drains/Wounds Patient Lines/Drains/Airways Status   Active Line/Drains/Airways    Name:   Placement date:   Placement time:   Site:   Days:   Peripheral IV 04/08/18 Left;Medial Antecubital   04/08/18    1817     Antecubital   289   Peripheral IV 01/22/19 Left Antecubital   01/22/19    1553    Antecubital   less than 1          Labs/Imaging Results for orders placed or performed during the hospital encounter of 01/22/19 (from the past 48 hour(s))  Basic metabolic panel     Status: Abnormal   Collection Time: 01/22/19  3:50 PM  Result Value Ref Range   Sodium 140 135 - 145 mmol/L   Potassium 3.6 3.5 - 5.1 mmol/L   Chloride 107 98 - 111 mmol/L   CO2 28 22 - 32 mmol/L   Glucose, Bld 115 (H) 70 - 99 mg/dL   BUN 19 6 - 20 mg/dL   Creatinine, Ser 7.01 (H) 0.61 - 1.24 mg/dL   Calcium 9.3 8.9 - 41.0 mg/dL   GFR calc non Af Amer >60 >60 mL/min   GFR calc Af Amer >60 >60 mL/min   Anion gap 5 5 - 15    Comment: Performed at [facility], [address] [place], [place] [address]  CBC     Status: None   Collection Time: 01/22/19  3:50 PM  Result Value Ref Range   WBC 4.5 4.0 - 10.5 K/uL   RBC 4.30 4.22 - 5.81 MIL/uL  Hemoglobin 14.0 13.0 - 17.0 g/dL   HCT 16.140.9 09.639.0 - 04.552.0 %   MCV 95.1 80.0 - 100.0 fL   MCH 32.6 26.0 - 34.0 pg   MCHC 34.2 30.0 - 36.0 g/dL   RDW 40.912.4 81.111.5 - 91.415.5 %   Platelets 235 150 - 400 K/uL   nRBC 0.0 0.0 - 0.2 %    Comment: Performed at [facility], [address] [place], [place] [address]  Troponin I - ONCE - STAT     Status: None   Collection Time: 01/22/19  3:50 PM  Result Value Ref Range   Troponin I <0.03 <0.03 ng/mL    Comment: Performed at [facility], [address] [place], [place] [address]   Dg Chest 2 View  Result Date: 01/22/2019 CLINICAL DATA:  Chest pain. EXAM: CHEST - 2 VIEW COMPARISON:  05/26/2018 and 04/08/2018 FINDINGS: The heart size and mediastinal contours are within normal limits. Both lungs are clear. The visualized skeletal structures are unremarkable. IMPRESSION: Normal exam. Electronically Signed   By: Francene BoyersJames  Maxwell M.D.   On: 01/22/2019 16:44    Pending Labs Unresulted Labs (From admission, onward)     Start     Ordered   01/29/19 0500  Creatinine, serum  (enoxaparin (LOVENOX)    CrCl >/= 30 ml/min)  Weekly,   STAT    Comments:  while on enoxaparin therapy    01/22/19 1735   01/22/19 2359  Troponin I - Once  Once,   STAT     01/22/19 1734   01/22/19 2359  Lipid panel  Once-Timed,   STAT     01/22/19 1736   01/22/19 2000  Troponin I - Once  Once,   STAT     01/22/19 1734   01/22/19 1742  Helper T-Lymph-CD4 ([facility] only)  Add-on,   AD     01/22/19 1741          Vitals/Pain Today's Vitals   01/22/19 1706 01/22/19 1706 01/22/19 1754 01/22/19 1851  BP: 103/70   106/71  Pulse: 62   (!) 53  Resp: 19   16  SpO2: 95%   98%  Weight:      Height:   5\' 9"  (1.753 m)   PainSc:  5       Isolation Precautions No active isolations  Medications Medications  nitroGLYCERIN (NITROSTAT) SL tablet 0.4 mg (0.4 mg Sublingual Given 01/22/19 1705)  enoxaparin (LOVENOX) injection 40 mg (has no administration in time range)  acetaminophen (TYLENOL) tablet 650 mg (has no administration in time range)    Or  acetaminophen (TYLENOL) suppository 650 mg (has no administration in time range)  ondansetron (ZOFRAN) tablet 4 mg (has no administration in time range)    Or  ondansetron (ZOFRAN) injection 4 mg (has no administration in time range)  oxyCODONE (Oxy IR/ROXICODONE) immediate release tablet 5 mg (has no administration in time range)  bictegravir-emtricitabine-tenofovir AF (BIKTARVY) 50-200-25 MG per tablet 1 tablet (has no administration in time range)  atorvastatin (LIPITOR) tablet 40 mg (40 mg Oral Given 01/22/19 1813)  sertraline (ZOLOFT) tablet 75 mg (has no administration in time range)  traZODone (DESYREL) tablet 300 mg (has no administration in time range)  lactase (LACTAID) tablet 3,000 Units (has no administration in time range)  lactulose (CHRONULAC) 10 GM/15ML solution 20 g (has no administration in time range)  gabapentin (NEURONTIN) capsule 300 mg (has no administration in time range)   albuterol (PROVENTIL) (2.5 MG/3ML) 0.083% nebulizer solution 2.5  mg (has no administration in time range)  oxymetazoline (AFRIN) 0.05 % nasal spray 1 spray (has no administration in time range)  0.9 %  sodium chloride infusion ( Intravenous New Bag/Given 01/22/19 1810)  sodium chloride flush (NS) 0.9 % injection 3 mL (3 mLs Intravenous Given 01/22/19 1554)  aspirin chewable tablet 324 mg (324 mg Oral Given 01/22/19 1656)    Mobility walks
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
## 2019-01-22 NOTE — ED Notes (Signed)
Dinner tray given at this time.  
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
## 2019-01-22 NOTE — ED Notes (Signed)
Patients HT 5 ft 9in.

## 2019-01-22 NOTE — ED Notes (Signed)
Dietary notified to send dinner tray. 
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
## 2019-01-22 NOTE — ED Notes (Signed)
Patient reports chest pain down to 3/1`0 after 2nd nitro. Hospitalist at bedside to assess and plan admission

## 2019-01-23 ENCOUNTER — Other Ambulatory Visit: Payer: Self-pay

## 2019-01-23 ENCOUNTER — Observation Stay
Admit: 2019-01-23 | Discharge: 2019-01-23 | Disposition: A | Discharge status: Home / Self Care | Payer: Medicaid Other | Attending: Family Medicine | Admitting: Family Medicine

## 2019-01-23 LAB — TSH: TSH: 2.105 u[IU]/mL (ref 0.350–4.500)

## 2019-01-23 LAB — LIPID PANEL
Cholesterol: 158 mg/dL (ref 0–200)
HDL: 43 mg/dL (ref >40)
LDL Cholesterol: 102 mg/dL — ABNORMAL HIGH (ref 0–99)
Total CHOL/HDL Ratio: 3.7 RATIO
Triglycerides: 63 mg/dL (ref <150)
VLDL: 13 mg/dL (ref 0–40)

## 2019-01-23 LAB — MRSA PCR SCREENING: MRSA by PCR: NEGATIVE

## 2019-01-23 LAB — ECHOCARDIOGRAM COMPLETE
Height: 69 in
Weight: 3414.13 oz

## 2019-01-23 LAB — TROPONIN I: Troponin I: <0.03 ng/mL (ref <0.03)

## 2019-01-23 NOTE — Progress Notes (Signed)
*  PRELIMINARY RESULTS* Echocardiogram 2D Echocardiogram has been performed.  Roy [name] 01/23/2019, 2:16 PM
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
## 2019-01-24 LAB — HELPER T-LYMPH-CD4 (ARMC ONLY)
% CD 4 Pos. Lymph.: 31.8 % (ref 30.8–58.5)
Absolute CD 4 Helper: 890 /uL (ref 359–1519)
Basophils Absolute: 0 10*3/uL (ref 0.0–0.2)
Basos: 1 %
EOS (ABSOLUTE): 0.1 10*3/uL (ref 0.0–0.4)
Eos: 2 %
Hematocrit: 38 % (ref 37.5–51.0)
Hemoglobin: 12.9 g/dL — ABNORMAL LOW (ref 13.0–17.7)
Immature Grans (Abs): 0 10*3/uL (ref 0.0–0.1)
Immature Granulocytes: 0 %
Lymphocytes Absolute: 2.8 10*3/uL (ref 0.7–3.1)
Lymphs: 65 %
MCH: 33.4 pg — ABNORMAL HIGH (ref 26.6–33.0)
MCHC: 33.9 g/dL (ref 31.5–35.7)
MCV: 98 fL — ABNORMAL HIGH (ref 79–97)
MONOCYTES: 9 %
Monocytes Absolute: 0.4 10*3/uL (ref 0.1–0.9)
Neutrophils Absolute: 1 10*3/uL — ABNORMAL LOW (ref 1.4–7.0)
Neutrophils: 23 %
Platelets: 214 10*3/uL (ref 150–450)
RBC: 3.86 x10E6/uL — ABNORMAL LOW (ref 4.14–5.80)
RDW: 12.6 % (ref 11.6–15.4)
WBC: 4.2 10*3/uL (ref 3.4–10.8)

## 2019-01-24 NOTE — Plan of Care (Signed)
VSS. No complaints of chest pain. Patient is prepared to be discharged to shelter.
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
## 2019-01-24 NOTE — Clinical Social Work Note (Signed)
The CSW met with the patient's RNCM to discuss discharge planning. The CSW contacted the patient who confirmed that he plans to discharge to the detox unit at [place], [place]. After detox, he will transition to the [facility] house in [place] or [place] (depending on openings) and [place]. The CSW confirmed with the [facility] that the [facility] can provide a taxi voucher for discharge to the [facility] location. The CSW provided the address to the [facility]. The CSW is signing off. Please consult should additional needs arise.  Santiago Bumpers, MSW, Latanya Presser [phone]
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
## 2019-01-24 NOTE — Progress Notes (Signed)
[name] normal heart rate is in the 40's. Patient is assymptomatic.

## 2019-01-24 NOTE — Progress Notes (Signed)
Pt to [facility] to [facility]  In [place] today. Iv and tele removed. Discharge instructions and taxi voucher provided to pt. [name] med gather ed from pharmacy and given to him. Awaiting taxi.

## 2019-01-24 NOTE — Clinical Social Work Note (Addendum)
CSW received consult from [facility] that patient no longer has a bed at [facility]. The CSW contacted the patient who confirmed such. The patient gave verbal permission for a referral to [facility] for detox. The CSW has contacted the facility who has requested the H+P and discharge summary. CSW is waiting for disposition and will update as more information is available.  UPDATE: [facility] does not have any beds available for the patient (not due to medical issues; facility is full). CSW will direct the patient to contact [facility] for further assistance. CSW will update once more information is available.  UPDATE: The patient was told by [employer] that he would have to discharge and call mobile crisis. The patient has no location to which he can discharge. The CSW contacted [facility] in [place], and the patient can stay there for tonight due to emergency weather conditions (temperature under 39 degrees). In the meantime, the patient has contacted a representative from [facility] who will reach out to the CSW if they can take the patient, tonight. The CSW has sent a taxi voucher to the patient's RN to have him transported to the shelter in [place] should [facility] fall through. The CSW is following.  UPDATE: CSW spoke with Mobile Crisis personnel [facility] about the patient. The plan is for the patient to be met by a mobile crisis worker who will attempt to get the patient a detox bed by tomorrow. After the assessment, and if they are unable to find a bed for him tonight, the patient can use the taxi voucher to go to the shelter in [place]. The CSW has provided the mobile crisis worker with the patient's direct number in his room. CSW has set a plan for discharge this evening to the shelter with the patient and the mobile crisis worker. At this time, the CSW is signing off. Should the patient remain in the hospital overnight, the CSW will begin to follow at the next available  shift.  Santiago Bumpers, MSW, Latanya Presser [phone]

## 2019-01-25 ENCOUNTER — Ambulatory Visit: Admit: 2019-01-25 | Discharge: 2019-01-27 | Disposition: A | Discharge status: Home / Self Care | Payer: MEDICAID

## 2019-01-25 ENCOUNTER — Emergency Department: Admit: 2019-01-25 | Discharge: 2019-01-27 | Disposition: A | Discharge status: Home / Self Care | Payer: MEDICAID

## 2019-01-25 DIAGNOSIS — R45851 Suicidal ideations: Principal | ICD-10-CM

## 2019-01-31 ENCOUNTER — Ambulatory Visit
Admit: 2019-01-31 | Discharge: 2019-01-31 | Disposition: A | Discharge status: Home / Self Care | Payer: MEDICAID | Attending: Family

## 2019-02-06 MED ORDER — LISINOPRIL 20 MG TABLET
ORAL_TABLET | Freq: Every day | ORAL | 11 refills | 0 days | Status: CP
Start: 2019-02-06 — End: 2019-03-08

## 2019-02-06 MED ORDER — BIKTARVY 50 MG-200 MG-25 MG TABLET
ORAL_TABLET | Freq: Every day | ORAL | 11 refills | 0 days | Status: CP
Start: 2019-02-06 — End: ?

## 2019-02-06 MED ORDER — TRAZODONE 300 MG TABLET
ORAL_TABLET | Freq: Every evening | ORAL | 0 refills | 0.00000 days | Status: CP
Start: 2019-02-06 — End: ?

## 2019-02-09 ENCOUNTER — Ambulatory Visit: Admit: 2019-02-09 | Discharge: 2019-02-10 | Payer: MEDICAID

## 2019-02-09 DIAGNOSIS — R0981 Nasal congestion: Secondary | ICD-10-CM

## 2019-02-09 DIAGNOSIS — R05 Cough: Principal | ICD-10-CM

## 2019-02-09 DIAGNOSIS — B20 Human immunodeficiency virus [HIV] disease: Principal | ICD-10-CM

## 2019-02-09 DIAGNOSIS — Z720 Tobacco use: Principal | ICD-10-CM

## 2019-02-09 DIAGNOSIS — R0609 Other forms of dyspnea: Secondary | ICD-10-CM

## 2019-02-09 MED ORDER — FLUTICASONE PROPIONATE 50 MCG/ACTUATION NASAL SPRAY,SUSPENSION
Freq: Two times a day (BID) | NASAL | 6 refills | 0 days | Status: CP
Start: 2019-02-09 — End: 2019-05-07

## 2019-02-09 MED ORDER — BUPROPION HCL SR 150 MG TABLET,12 HR SUSTAINED-RELEASE
ORAL_TABLET | Freq: Two times a day (BID) | ORAL | 11 refills | 0 days | Status: CP
Start: 2019-02-09 — End: 2020-02-09

## 2019-02-09 MED ORDER — VARENICLINE 0.5 MG TABLET
ORAL_TABLET | 0 refills | 0 days | Status: CP
Start: 2019-02-09 — End: 2019-02-09

## 2019-02-11 ENCOUNTER — Ambulatory Visit: Admit: 2019-02-11 | Discharge: 2019-02-12 | Payer: MEDICAID | Attending: Registered" | Primary: Registered"

## 2019-02-11 DIAGNOSIS — E669 Obesity, unspecified: Principal | ICD-10-CM

## 2019-02-12 ENCOUNTER — Ambulatory Visit: Admit: 2019-02-12 | Discharge: 2019-02-13 | Payer: MEDICAID | Attending: Clinical | Primary: Clinical

## 2019-02-12 DIAGNOSIS — F1421 Cocaine dependence, in remission: Principal | ICD-10-CM

## 2019-02-19 ENCOUNTER — Emergency Department: Payer: Medicaid Other

## 2019-02-19 ENCOUNTER — Encounter: Payer: Self-pay | Admitting: Emergency Medicine

## 2019-02-19 ENCOUNTER — Other Ambulatory Visit: Payer: Self-pay

## 2019-02-19 ENCOUNTER — Encounter: Payer: Self-pay | Admitting: Behavioral Health

## 2019-02-19 ENCOUNTER — Emergency Department
Admission: EM | Admit: 2019-02-19 | Discharge: 2019-02-19 | Disposition: A | Discharge status: Other Acute Inpatient Hospital | Payer: Medicaid Other | Attending: Emergency Medicine | Admitting: Emergency Medicine

## 2019-02-19 ENCOUNTER — Inpatient Hospital Stay
Admission: AD | Admit: 2019-02-19 | Discharge: 2019-02-23 | DRG: 885 | Disposition: A | Discharge status: Home / Self Care | Payer: No Typology Code available for payment source | Source: Intra-hospital | Attending: Psychiatry | Admitting: Psychiatry

## 2019-02-19 DIAGNOSIS — K219 Gastro-esophageal reflux disease without esophagitis: Secondary | ICD-10-CM | POA: Diagnosis present

## 2019-02-19 DIAGNOSIS — F329 Major depressive disorder, single episode, unspecified: Secondary | ICD-10-CM | POA: Insufficient documentation

## 2019-02-19 DIAGNOSIS — K5909 Other constipation: Secondary | ICD-10-CM | POA: Diagnosis present

## 2019-02-19 DIAGNOSIS — F331 Major depressive disorder, recurrent, moderate: Secondary | ICD-10-CM | POA: Diagnosis not present

## 2019-02-19 DIAGNOSIS — F17210 Nicotine dependence, cigarettes, uncomplicated: Secondary | ICD-10-CM | POA: Diagnosis present

## 2019-02-19 DIAGNOSIS — F32A Depression, unspecified: Secondary | ICD-10-CM

## 2019-02-19 DIAGNOSIS — I10 Essential (primary) hypertension: Secondary | ICD-10-CM | POA: Insufficient documentation

## 2019-02-19 DIAGNOSIS — F1420 Cocaine dependence, uncomplicated: Secondary | ICD-10-CM | POA: Diagnosis present

## 2019-02-19 DIAGNOSIS — B20 Human immunodeficiency virus [HIV] disease: Secondary | ICD-10-CM | POA: Diagnosis present

## 2019-02-19 DIAGNOSIS — I259 Chronic ischemic heart disease, unspecified: Secondary | ICD-10-CM | POA: Diagnosis present

## 2019-02-19 DIAGNOSIS — Z79899 Other long term (current) drug therapy: Secondary | ICD-10-CM

## 2019-02-19 DIAGNOSIS — J45909 Unspecified asthma, uncomplicated: Secondary | ICD-10-CM | POA: Diagnosis present

## 2019-02-19 DIAGNOSIS — F332 Major depressive disorder, recurrent severe without psychotic features: Secondary | ICD-10-CM | POA: Diagnosis present

## 2019-02-19 DIAGNOSIS — R45851 Suicidal ideations: Secondary | ICD-10-CM | POA: Diagnosis present

## 2019-02-19 DIAGNOSIS — E739 Lactose intolerance, unspecified: Secondary | ICD-10-CM | POA: Diagnosis present

## 2019-02-19 DIAGNOSIS — Z590 Homelessness: Secondary | ICD-10-CM

## 2019-02-19 LAB — URINE DRUG SCREEN, QUALITATIVE (ARMC ONLY)
Amphetamines, Ur Screen: NOT DETECTED
Barbiturates, Ur Screen: NOT DETECTED
Benzodiazepine, Ur Scrn: NOT DETECTED
Cannabinoid 50 Ng, Ur ~~LOC~~: NOT DETECTED
Cocaine Metabolite,Ur ~~LOC~~: POSITIVE — AB
MDMA (Ecstasy)Ur Screen: NOT DETECTED
Methadone Scn, Ur: NOT DETECTED
Opiate, Ur Screen: NOT DETECTED
Phencyclidine (PCP) Ur S: NOT DETECTED
Tricyclic, Ur Screen: NOT DETECTED

## 2019-02-19 LAB — CBC
HCT: 43.6 % (ref 39.0–52.0)
Hemoglobin: 15 g/dL (ref 13.0–17.0)
MCH: 32.5 pg (ref 26.0–34.0)
MCHC: 34.4 g/dL (ref 30.0–36.0)
MCV: 94.6 fL (ref 80.0–100.0)
Platelets: 245 10*3/uL (ref 150–400)
RBC: 4.61 MIL/uL (ref 4.22–5.81)
RDW: 12.4 % (ref 11.5–15.5)
WBC: 6.4 10*3/uL (ref 4.0–10.5)
nRBC: 0 % (ref 0.0–0.2)

## 2019-02-19 LAB — COMPREHENSIVE METABOLIC PANEL
ALBUMIN: 4.5 g/dL (ref 3.5–5.0)
ALT: 36 U/L (ref 0–44)
AST: 86 U/L — ABNORMAL HIGH (ref 15–41)
Alkaline Phosphatase: 75 U/L (ref 38–126)
Anion gap: 11 (ref 5–15)
BUN: 32 mg/dL — ABNORMAL HIGH (ref 6–20)
CO2: 23 mmol/L (ref 22–32)
Calcium: 9.3 mg/dL (ref 8.9–10.3)
Chloride: 102 mmol/L (ref 98–111)
Creatinine, Ser: 1.47 mg/dL — ABNORMAL HIGH (ref 0.61–1.24)
GFR calc Af Amer: >60 mL/min (ref >60)
GFR calc non Af Amer: 53 mL/min — ABNORMAL LOW (ref >60)
Glucose, Bld: 71 mg/dL (ref 70–99)
Potassium: 3.7 mmol/L (ref 3.5–5.1)
SODIUM: 136 mmol/L (ref 135–145)
Total Bilirubin: 1.6 mg/dL — ABNORMAL HIGH (ref 0.3–1.2)
Total Protein: 8.6 g/dL — ABNORMAL HIGH (ref 6.5–8.1)

## 2019-02-19 LAB — ACETAMINOPHEN LEVEL: Acetaminophen (Tylenol), Serum: <10 ug/mL — ABNORMAL LOW (ref 10–30)

## 2019-02-19 LAB — ETHANOL: Alcohol, Ethyl (B): <10 mg/dL (ref <10)

## 2019-02-19 LAB — SALICYLATE LEVEL: Salicylate Lvl: <7 mg/dL (ref 2.8–30.0)

## 2019-02-19 MED ORDER — BICTEGRAVIR-EMTRICITAB-TENOFOV 50-200-25 MG PO TABS
1.0000 | ORAL_TABLET | Freq: Every day | ORAL | Status: DC
  Filled 2019-02-19: qty 1

## 2019-02-19 MED ORDER — LACTASE 3000 UNITS PO TABS
3000.0000 [IU] | ORAL_TABLET | Freq: Three times a day (TID) | ORAL | Status: DC
  Administered 2019-02-19: 3000 [IU] via ORAL
  Filled 2019-02-19 (×2): qty 1

## 2019-02-19 MED ORDER — LISINOPRIL 10 MG PO TABS
20.0000 mg | ORAL_TABLET | Freq: Every day | ORAL | Status: DC
  Administered 2019-02-19: 20 mg via ORAL
  Filled 2019-02-19: qty 2

## 2019-02-19 MED ORDER — AMLODIPINE BESYLATE 5 MG PO TABS
5.0000 mg | ORAL_TABLET | Freq: Every day | ORAL | Status: DC
  Administered 2019-02-20 – 2019-02-23 (×4): 5 mg via ORAL
  Filled 2019-02-19 (×4): qty 1

## 2019-02-19 MED ORDER — ALUM & MAG HYDROXIDE-SIMETH 200-200-20 MG/5ML PO SUSP: 30.0000 mL | ORAL | Status: DC | PRN

## 2019-02-19 MED ORDER — SERTRALINE HCL 100 MG PO TABS
100.0000 mg | ORAL_TABLET | Freq: Every day | ORAL | Status: DC
  Administered 2019-02-20: 100 mg via ORAL
  Filled 2019-02-19: qty 1

## 2019-02-19 MED ORDER — AMLODIPINE BESYLATE 5 MG PO TABS
5.0000 mg | ORAL_TABLET | Freq: Every day | ORAL | Status: DC
  Administered 2019-02-19: 5 mg via ORAL
  Filled 2019-02-19: qty 1

## 2019-02-19 MED ORDER — LACTULOSE 10 GM/15ML PO SOLN
20.0000 g | Freq: Every day | ORAL | Status: DC
  Administered 2019-02-20 – 2019-02-22 (×3): 20 g via ORAL
  Filled 2019-02-19 (×4): qty 30

## 2019-02-19 MED ORDER — ALBUTEROL SULFATE HFA 108 (90 BASE) MCG/ACT IN AERS
2.0000 | INHALATION_SPRAY | RESPIRATORY_TRACT | Status: DC | PRN
  Filled 2019-02-19: qty 6.7

## 2019-02-19 MED ORDER — LACTASE 3000 UNITS PO TABS
3000.0000 [IU] | ORAL_TABLET | Freq: Three times a day (TID) | ORAL | Status: DC
  Administered 2019-02-20 – 2019-02-23 (×10): 3000 [IU] via ORAL
  Filled 2019-02-19 (×13): qty 1

## 2019-02-19 MED ORDER — BICTEGRAVIR-EMTRICITAB-TENOFOV 50-200-25 MG PO TABS
1.0000 | ORAL_TABLET | Freq: Every day | ORAL | Status: DC
  Administered 2019-02-20 – 2019-02-23 (×4): 1 via ORAL
  Filled 2019-02-19 (×5): qty 1

## 2019-02-19 MED ORDER — MAGNESIUM HYDROXIDE 400 MG/5ML PO SUSP: 30.0000 mL | Freq: Every day | ORAL | Status: DC | PRN

## 2019-02-19 MED ORDER — GABAPENTIN 300 MG PO CAPS
300.0000 mg | ORAL_CAPSULE | Freq: Three times a day (TID) | ORAL | Status: DC
  Administered 2019-02-19: 300 mg via ORAL
  Filled 2019-02-19: qty 1

## 2019-02-19 MED ORDER — GABAPENTIN 300 MG PO CAPS
300.0000 mg | ORAL_CAPSULE | Freq: Three times a day (TID) | ORAL | Status: DC
  Administered 2019-02-19 – 2019-02-23 (×11): 300 mg via ORAL
  Filled 2019-02-19 (×11): qty 1

## 2019-02-19 MED ORDER — LACTULOSE 10 GM/15ML PO SOLN
20.0000 g | Freq: Every day | ORAL | Status: DC
  Administered 2019-02-19: 20 g via ORAL
  Filled 2019-02-19: qty 30

## 2019-02-19 MED ORDER — ALBUTEROL SULFATE (2.5 MG/3ML) 0.083% IN NEBU: 2.5000 mg | INHALATION_SOLUTION | RESPIRATORY_TRACT | Status: DC | PRN

## 2019-02-19 MED ORDER — LISINOPRIL 20 MG PO TABS
20.0000 mg | ORAL_TABLET | Freq: Every day | ORAL | Status: DC
  Administered 2019-02-20 – 2019-02-23 (×4): 20 mg via ORAL
  Filled 2019-02-19 (×4): qty 1

## 2019-02-19 MED ORDER — OXYMETAZOLINE HCL 0.05 % NA SOLN
1.0000 | Freq: Two times a day (BID) | NASAL | Status: DC
  Administered 2019-02-19 – 2019-02-23 (×7): 1 via NASAL
  Filled 2019-02-19: qty 15

## 2019-02-19 MED ORDER — SERTRALINE HCL 50 MG PO TABS: 75.0000 mg | ORAL_TABLET | Freq: Every day | ORAL | Status: DC

## 2019-02-19 MED ORDER — SERTRALINE HCL 50 MG PO TABS
100.0000 mg | ORAL_TABLET | Freq: Every day | ORAL | Status: DC
  Administered 2019-02-19: 100 mg via ORAL
  Filled 2019-02-19: qty 2

## 2019-02-19 MED ORDER — OXYMETAZOLINE HCL 0.05 % NA SOLN: 1.0000 | Freq: Two times a day (BID) | NASAL | Status: DC

## 2019-02-19 MED ORDER — ACETAMINOPHEN 325 MG PO TABS
650.0000 mg | ORAL_TABLET | Freq: Four times a day (QID) | ORAL | Status: DC | PRN
  Administered 2019-02-20 – 2019-02-22 (×3): 650 mg via ORAL
  Filled 2019-02-19 (×3): qty 2

## 2019-02-19 MED ORDER — ATORVASTATIN CALCIUM 20 MG PO TABS: 40.0000 mg | ORAL_TABLET | Freq: Every day | ORAL | Status: DC

## 2019-02-19 MED ORDER — TRAZODONE HCL 100 MG PO TABS
300.0000 mg | ORAL_TABLET | Freq: Every day | ORAL | Status: DC
  Administered 2019-02-19: 300 mg via ORAL
  Filled 2019-02-19: qty 3

## 2019-02-19 MED ORDER — TRAZODONE HCL 100 MG PO TABS: 300.0000 mg | ORAL_TABLET | Freq: Every day | ORAL | Status: DC

## 2019-02-19 MED ORDER — ATORVASTATIN CALCIUM 20 MG PO TABS
40.0000 mg | ORAL_TABLET | Freq: Every day | ORAL | Status: DC
  Administered 2019-02-19 – 2019-02-22 (×4): 40 mg via ORAL
  Filled 2019-02-19 (×4): qty 2

## 2019-02-19 NOTE — ED Notes (Signed)
Patient assigned to appropriate care area   Introduced self to pt  Patient oriented to unit/care area: Informed that, for their safety, care areas are designed for safety and visiting and phone hours explained to patient. Patient verbalizes understanding, and verbal contract for safety obtained  Environment secured   Says he is feeling depressed and suicidal and worthless.  [facility]ed [facility]worker at [facility] and they told him to come here

## 2019-02-19 NOTE — Plan of Care (Signed)
Patient is a 53 year old male admitted to [facility] for depression and suicidal thoughts. Patient is homeless, states he recently was released from prison and is having a hard time financially. Patient states he has been trying to get some type of check reinstated but it may take 30 to 60 days.Patient states he has social support from [facility] and a Sports coach. Patient very flat, but logical and coherent. Patient disheveled in scrubs, but pleasant. Patient does smoke cigarettes but refused a nicotine patch at this time, denies alcohol use, patient did test positive for cocaine and is HIV positive. Patient skin is dry and flaky no open sores or wounds. Patient escorted to room and oriented to unit.

## 2019-02-19 NOTE — ED Notes (Signed)
Patient being transferred to [facility] room 302. Patient received belongings and verbalized he has received all of his belongings. Patient appropriate and cooperative, Denies SI/HI AVH. Vital signs taken. NAD noted.

## 2019-02-19 NOTE — BH Assessment (Signed)
Assessment Note  Roy Schielke Sr. is a 53 y.o. male who presented to the ED with the following complaint "says he is feeling depressed and suicidal and worthless.  Called his caseworker at [facility] and they told him to come here."  Upon assessment, Roy [name] is calm and cooperative. He is oriented and has a clear thought process. He reports current suicidal ideation with no intent or plan.  There is a history of suicide attempt several years ago. There is no HI and no AVH. There is a history of substance use with patient reporting last use "yesterday" with no use in about three years prior to this.   Patient reports he was recently imprisoned - released in January 2020 and has been staying in an [address] since that time.  He reports no longer being able to stay at the [facility] due to inability to pay at this time as his disability benefits have not been reinstated.     Diagnosis: Depression  Past Medical History:  Past Medical History:  Diagnosis Date  . AIDS (acquired immune deficiency syndrome) (HCC)   . Arthritis   . Asthma   . Bipolar disorder (HCC)   . Bronchitis   . Depression   . GERD (gastroesophageal reflux disease)   . Hepatitis C   . HIV (human immunodeficiency virus infection) (HCC)   . HTN (hypertension)     Past Surgical History:  Procedure Laterality Date  . HERNIA REPAIR    . TOE SURGERY      Family History:  Family History  Problem Relation Age of Onset  . Cancer Brother   . Uterine cancer Mother   . CAD Mother   . Hypertension Mother   . Hyperlipidemia Mother     Social History:  reports that he has never smoked. He has never used smokeless tobacco. He reports current alcohol use of about 4.0 standard drinks of alcohol per week. He reports current drug use. Drug: Cocaine.  Additional Social History:  Alcohol / Drug Use Pain Medications: See PTA Prescriptions: See PTA Over the Counter: See PTA History of alcohol / drug use?: Yes Longest  period of sobriety (when/how long): about 2.5 to 3 years  Negative Consequences of Use: Legal Substance #1 Name of Substance 1: Cocaine 1 - Age of First Use: 33 1 - Frequency: regularly prior to 3 years ago 1 - Duration: "about 15 years"  1 - Last Use / Amount: "Yesterday"  CIWA: CIWA-Ar BP: 116/80 Pulse Rate: 85 COWS:    Allergies:  Allergies  Allergen Reactions  . Lactose Other (See Comments)    GI distress  . Pollen Extract Itching    Home Medications: (Not in a hospital admission)   OB/GYN Status:  No LMP for male patient.  General Assessment Data Location of Assessment: [facility] ED TTS Assessment: In system Is this a Tele or Face-to-Face Assessment?: Face-to-Face Is this an Initial Assessment or a Re-assessment for this encounter?: Initial Assessment Patient Accompanied by:: N/A Language Other than English: No Living Arrangements: Homeless/Shelter What gender do you identify as?: Male Marital status: Long term relationship Pregnancy Status: No Living Arrangements: Other (Comment)(no stable housing) Can pt return to current living arrangement?: Yes Admission Status: Voluntary Is patient capable of signing voluntary admission?: Yes Referral Source: Self/Family/Friend Insurance type: (Medicaid)  Medical Screening Exam [facility] Walk-in ONLY) Medical Exam completed: Yes  Crisis Care Plan Living Arrangements: Other (Comment)(no stable housing) Legal Guardian: (none identified)  Education Status Is patient currently  in school?: No  Risk to self with the past 6 months Suicidal Ideation: Yes-Currently Present Has patient been a risk to self within the past 6 months prior to admission? : No Suicidal Intent: No Has patient had any suicidal intent within the past 6 months prior to admission? : No Is patient at risk for suicide?: Yes Suicidal Plan?: No-Not Currently/Within Last 6 Months Has patient had any suicidal plan within the past 6 months prior to admission? :  No Access to Means: Yes Specify Access to Suicidal Means: (medications) What has been your use of drugs/alcohol within the last 12 months?: reports cocaine use 02/19/2019 Previous Attempts/Gestures: Yes How many times?: (1) Other Self Harm Risks: (none noted) Triggers for Past Attempts: Unknown Intentional Self Injurious Behavior: None Family Suicide History: Unknown Recent stressful life event(s): (loss of housing) Persecutory voices/beliefs?: No Depression: Yes Depression Symptoms: Tearfulness, Feeling worthless/self pity, Insomnia Substance abuse history and/or treatment for substance abuse?: Yes Suicide prevention information given to non-admitted patients: Not applicable  Risk to Others within the past 6 months Homicidal Ideation: No Does patient have any lifetime risk of violence toward others beyond the six months prior to admission? : No Thoughts of Harm to Others: No Current Homicidal Intent: No Current Homicidal Plan: No Access to Homicidal Means: No Identified Victim: (----) History of harm to others?: No Assessment of Violence: None Noted Violent Behavior Description: (--) Does patient have access to weapons?: No Criminal Charges Pending?: No Does patient have a court date: No Is patient on probation?: No  Psychosis Hallucinations: None noted Delusions: None noted  Mental Status Report Appearance/Hygiene: In scrubs, Unremarkable Eye Contact: Good Motor Activity: Freedom of movement Speech: Logical/coherent Level of Consciousness: Alert Mood: Depressed Affect: Appropriate to circumstance Anxiety Level: None Thought Processes: Coherent, Relevant Judgement: Unimpaired Orientation: Person, Place, Time, Situation, Appropriate for developmental age Obsessive Compulsive Thoughts/Behaviors: None  Cognitive Functioning Concentration: Normal Memory: Remote Intact, Recent Intact Is patient IDD: No Insight: Good Impulse Control: Good Appetite: Fair Have you had  any weight changes? : Loss Amount of the weight change? (lbs): (5lbs in 30 days) Sleep: Decreased Total Hours of Sleep: 3 Vegetative Symptoms: None  ADLScreening [facility]) [name] cognitive ability adequate to safely complete daily activities?: Yes Patient able to express need for assistance with ADLs?: Yes Independently performs ADLs?: Yes (appropriate for developmental age)  Prior Inpatient Therapy Prior Inpatient Therapy: Yes Prior Therapy Dates: 2019 Prior Therapy Facilty/Provider(s): [facility]     ADL Screening (condition at time of admission) [name] cognitive ability adequate to safely complete daily activities?: Yes Is the patient deaf or have difficulty hearing?: No Does the patient have difficulty seeing, even when wearing glasses/contacts?: No Does the patient have difficulty concentrating, remembering, or making decisions?: No Patient able to express need for assistance with ADLs?: Yes Does the patient have difficulty dressing or bathing?: No Independently performs ADLs?: Yes (appropriate for developmental age) Does the patient have difficulty walking or climbing stairs?: No Weakness of Legs: None Weakness of Arms/Hands: None     Therapy Consults (therapy consults require a physician order) PT Evaluation Needed: No OT Evalulation Needed: No SLP Evaluation Needed: No Abuse/Neglect Assessment (Assessment to be complete while patient is alone) Abuse/Neglect Assessment Can Be Completed: Yes Physical Abuse: Denies Verbal Abuse: Denies Sexual Abuse: Denies Exploitation of patient/patient's resources: Denies Self-Neglect: Denies Values / Beliefs Cultural Requests During Hospitalization: None Consults Spiritual Care Consult Needed: No Social Work Consult Needed: No Merchant navy officer (For Healthcare) Does Patient Have  a Medical Advance Directive?: No          Disposition:    Inpatient Admission to [facility] On Site Evaluation by:  Dr.  Thereasa Distance Becton 02/19/2019 12:05 PM

## 2019-02-19 NOTE — BHH Suicide Risk Assessment (Addendum)
[facility] Admission Suicide Risk Assessment   Nursing information obtained from:  Patient Demographic factors:  Male, Low socioeconomic status Current Mental Status:  Suicidal ideation indicated by patient, Self-harm thoughts Loss Factors:  Financial problems / change in socioeconomic status Historical Factors:  NA Risk Reduction Factors:  Positive social support  Total Time spent with patient: 1 hour Principal Problem: MDD (major depressive disorder), recurrent severe, without psychosis (HCC) Diagnosis:  Principal Problem:   MDD (major depressive disorder), recurrent severe, without psychosis (HCC) Active Problems:   HIV disease (HCC)   HTN (hypertension)   Cocaine use disorder, moderate, dependence (HCC)  Subjective Data: suicidal ideation  Continued Clinical Symptoms:  Alcohol Use Disorder Identification Test Final Score (AUDIT): 0 The "Alcohol Use Disorders Identification Test", Guidelines for Use in Primary Care, Second Edition.  World Science writer [facility]). Score between 0-7:  no or low risk or alcohol related problems. Score between 8-15:  moderate risk of alcohol related problems. Score between 16-19:  high risk of alcohol related problems. Score 20 or above:  warrants further diagnostic evaluation for alcohol dependence and treatment.   CLINICAL FACTORS:   Depression:   Comorbid alcohol abuse/dependence Impulsivity Alcohol/Substance Abuse/Dependencies   Musculoskeletal: Strength & Muscle Tone: within normal limits Gait & Station: normal Patient leans: N/A  Psychiatric Specialty Exam: Physical Exam  Nursing note and vitals reviewed. Psychiatric: He has a normal mood and affect. His speech is normal and behavior is normal. Cognition and memory are normal. He expresses impulsivity. He expresses suicidal ideation. He expresses suicidal plans.    Review of Systems  Neurological: Negative.   Psychiatric/Behavioral: Positive for depression, substance abuse and suicidal  ideas.  All other systems reviewed and are negative.   Blood pressure 120/74, pulse 63, temperature 99 F (37.2 C), temperature source Oral, resp. rate 18, height 5\' 9"  (1.753 m), weight 94.3 kg, SpO2 98 %.Body mass index is 30.72 kg/m.  General Appearance: Casual  Eye Contact:  Good  Speech:  Clear and Coherent  Volume:  Normal  Mood:  Euthymic  Affect:  Appropriate  Thought Process:  Goal Directed and Descriptions of Associations: Intact  Orientation:  Full (Time, Place, and Person)  Thought Content:  WDL  Suicidal Thoughts:  No  Homicidal Thoughts:  No  Memory:  Immediate;   Fair Recent;   Fair Remote;   Fair  Judgement:  Impaired  Insight:  Lacking  Psychomotor Activity:  Normal  Concentration:  Concentration: Fair and Attention Span: Fair  Recall:  [employer] of Knowledge:  Fair  Language:  Fair  Akathisia:  No  Handed:  Right  AIMS (if indicated):     Assets:  Communication Skills Desire for Improvement Resilience Social Support  ADL's:  Intact  Cognition:  WNL  Sleep:  Number of Hours: 6.5      COGNITIVE FEATURES THAT CONTRIBUTE TO RISK:  None    SUICIDE RISK:   Mild:  Suicidal ideation of limited frequency, intensity, duration, and specificity.  There are no identifiable plans, no associated intent, mild dysphoria and related symptoms, good self-control (both objective and subjective assessment), few other risk factors, and identifiable protective factors, including available and accessible social support.  PLAN OF CARE: hospital admission, mdiation management, substance abuse counseling, dischare planing.  Roy [name] is a 53 year old male with a history of depression, cocaine addiction, HIV and multiple social problems admitted for suicidal ideation with a plan to overdose on medication.  #Suicidal ideation, resolved -patient is able to contract for  safety in the hospital  #Mood  -restart Zoloft 100 mg -Trazodone 100 mg -Neurontin 300 mg  TID  #HIV -continue Biktarvy  #HTN -Lisinopril 20 mg -Norvasc 5 mg  #Cocaie addiction -SAIOP at [facility]  #Disposition -attempt to get a spot at [facility], medication regiment could be a problem there, will try to shorten the list -follow up with [facility]  I certify that inpatient services furnished can reasonably be expected to improve the patient's condition.   Kristine Linea, MD 02/20/2019, 12:29 PM

## 2019-02-19 NOTE — BH Assessment (Signed)
Patient is to be admitted to [facility] by Dr. Viviano Simas.  Attending Physician will be Dr. Jennet Maduro .   Patient has been assigned to room 302, by [facility] Charge Nurse Shay.   Intake Paper Work has been signed and placed on patient chart.  ER staff is aware of the admission:  Glenda, ER Secretary    Dr. Delsa Sale, ER MD   Geralynn Ochs, [name] Nurse   Renita, Patient Access.

## 2019-02-19 NOTE — ED Provider Notes (Signed)
[facility] Emergency Department Provider Note   ____________________________________________   First MD Initiated Contact with Patient 02/19/19 1027     (approximate)  I have reviewed the triage vital signs and the nursing notes.   HISTORY  Chief Complaint Suicidal   HPI Roy Sitzman Sr. is a 53 y.o. male he reports feeling worthless.  He is now feeling suicidal.  He is also depressed.  Patient also reports no fever but erratically feeling in his chest and a cough which is nonproductive.         Past Medical History:  Diagnosis Date  . AIDS (acquired immune deficiency syndrome) (HCC)   . Arthritis   . Asthma   . Bipolar disorder (HCC)   . Bronchitis   . Depression   . GERD (gastroesophageal reflux disease)   . Hepatitis C   . HIV (human immunodeficiency virus infection) (HCC)   . HTN (hypertension)     Patient Active Problem List   Diagnosis Date Noted  . Chest pain 05/27/2018  . Major depressive disorder, recurrent severe without psychotic features (HCC) 05/27/2018  . ARF (acute renal failure) (HCC) 04/08/2018  . Cocaine use disorder, moderate, dependence (HCC) 02/19/2017  . Substance induced mood disorder (HCC) 02/19/2017  . HIV disease (HCC) 10/26/2016  . HTN (hypertension) 10/26/2016  . Dyslipidemia 10/26/2016  . BPH (benign prostatic hyperplasia) 10/26/2016  . Constipation 10/26/2016    Past Surgical History:  Procedure Laterality Date  . HERNIA REPAIR    . TOE SURGERY      Prior to Admission medications   Medication Sig Start Date End Date Taking? Authorizing Provider  albuterol (PROVENTIL HFA;VENTOLIN HFA) 108 (90 Base) MCG/ACT inhaler Inhale 2 puffs into the lungs every 4 (four) hours as needed for shortness of breath. 06/02/18   Pucilowska, Jolanta B, MD  amLODipine (NORVASC) 5 MG tablet Take 1 tablet (5 mg total) by mouth daily. 06/02/18   Pucilowska, Braulio Conte B, MD  atorvastatin (LIPITOR) 40 MG tablet Take 1 tablet  (40 mg total) by mouth daily at 6 PM. 06/02/18   Pucilowska, Jolanta B, MD  bictegravir-emtricitabine-tenofovir AF (BIKTARVY) 50-200-25 MG TABS tablet Take 1 tablet by mouth daily.    [provider]  gabapentin (NEURONTIN) 300 MG capsule Take 1 capsule (300 mg total) by mouth 3 (three) times daily. 06/02/18   Pucilowska, Braulio Conte B, MD  lactase (LACTAID) 3000 units tablet Take 3,000 Units by mouth 3 (three) times daily with meals.    [provider]  lactulose, encephalopathy, (CHRONULAC) 10 GM/15ML SOLN Take 20 g by mouth 2 (two) times daily as needed.    [provider]  lisinopril (PRINIVIL,ZESTRIL) 20 MG tablet Take 1 tablet (20 mg total) by mouth daily. 06/02/18   Pucilowska, Jolanta B, MD  oxymetazoline (AFRIN) 0.05 % nasal spray Place 1 spray into both nostrils 2 (two) times daily. 06/02/18   Pucilowska, Jolanta B, MD  sertraline (ZOLOFT) 25 MG tablet Take 75 mg by mouth daily.    [provider]  traZODone (DESYREL) 300 MG tablet Take 1 tablet (300 mg total) by mouth at bedtime. 06/02/18   Pucilowska, Ellin Goodie, MD    Allergies Lactose and Pollen extract  Family History  Problem Relation Age of Onset  . Cancer Brother   . Uterine cancer Mother   . CAD Mother   . Hypertension Mother   . Hyperlipidemia Mother     Social History Social History   Tobacco Use  . Smoking status:  Never Smoker  . Smokeless tobacco: Never Used  Substance Use Topics  . Alcohol use: Yes    Alcohol/week: 4.0 standard drinks    Types: 4 Cans of beer per week  . Drug use: Yes    Types: Cocaine    Review of Systems  Constitutional: No fever/chills Eyes: No visual changes. ENT: No sore throat. Cardiovascular: Denies chest pain. Respiratory: Denies shortness of breath see HPI. Gastrointestinal: No abdominal pain.  No nausea, no vomiting.  No diarrhea.  No constipation. Genitourinary: Negative for dysuria. Musculoskeletal: Negative for back pain. Skin: Negative for  rash. Neurological: Negative for headaches, focal weakness   ____________________________________________   PHYSICAL EXAM:  VITAL SIGNS: ED Triage Vitals  Enc Vitals Group     BP 02/19/19 0952 116/80     Pulse Rate 02/19/19 0952 85     Resp 02/19/19 0952 14     Temp 02/19/19 0952 98.5 F (36.9 C)     Temp Source 02/19/19 0952 Oral     SpO2 02/19/19 0952 96 %     Weight 02/19/19 0953 210 lb (95.3 kg)     Height 02/19/19 0953 5\' 9"  (1.753 m)     Head Circumference --      Peak Flow --      Pain Score 02/19/19 0953 10     Pain Loc --      Pain Edu? --      Excl. in GC? --     Constitutional: Alert and oriented.  Looks somewhat depressed. Eyes: Conjunctivae are normal.  Head: Atraumatic. Nose: No congestion/rhinnorhea. Mouth/Throat: Mucous membranes are moist.  Oropharynx non-erythematous. Neck: No stridor.   Cardiovascular: Normal rate, regular rhythm. Grossly normal heart sounds.  Good peripheral circulation. Respiratory: Normal respiratory effort.  No retractions. Lungs CTAB. Gastrointestinal: Soft and nontender. No distention. No abdominal bruits. No CVA tenderness. Musculoskeletal: No lower extremity tenderness nor edema.   Neurologic:  Normal speech and language. No gross focal neurologic deficits are appreciated.  Skin:  Skin is warm, dry and intact. No rash noted. Psychiatric: Mood and affect are normal. Speech and behavior are normal.  ____________________________________________   LABS (all labs ordered are listed, but only abnormal results are displayed)  Labs Reviewed  CBC  COMPREHENSIVE METABOLIC PANEL  ETHANOL  SALICYLATE LEVEL  ACETAMINOPHEN LEVEL  URINE DRUG SCREEN, QUALITATIVE ([facility] ONLY)   ____________________________________________  EKG   ____________________________________________  RADIOLOGY  ED MD interpretation:    Official radiology report(s): No results  found.  ____________________________________________   PROCEDURES  Procedure(s) performed (including Critical Care):  Procedures   ____________________________________________   INITIAL IMPRESSION / ASSESSMENT AND PLAN / ED COURSE                ____________________________________________   FINAL CLINICAL IMPRESSION(S) / ED DIAGNOSES  Final diagnoses:  Depression, unspecified depression type     ED Discharge Orders    None       Note:  This document was prepared using Dragon voice recognition software and may include unintentional dictation errors.    Arnaldo Natal, MD 02/19/19 1036

## 2019-02-19 NOTE — H&P (Addendum)
Psychiatric Admission Assessment Adult  Patient Identification: Roy Lady Vath Sr. MRN:  [id number] Date of Evaluation:  02/20/2019 Chief Complaint:  depression Principal Diagnosis: MDD (major depressive disorder), recurrent severe, without psychosis (HCC) Diagnosis:  Principal Problem:   MDD (major depressive disorder), recurrent severe, without psychosis (HCC) Active Problems:   HIV disease (HCC)   HTN (hypertension)   Cocaine use disorder, moderate, dependence (HCC)  History of Present Illness:   Identifying data. Roy [name] is a 52-year-od male with a history of cocaine addiction and depression.  Chief coplaint. "I just have nowhere to go."  History of present illness. Information was obtained from the patient and the chart. Roy [name] came to the ER after cocaine binge complaining of chest pain. He then reported suicidal ideation with a plan to overdose on medications. He was admitted in the fall in exactly same scenario. At discharge, he was picked up by his PO for probation violation and send to jail. He completed his sentence in January and has been homeless since. While using cocaine he lost his placement at the [facility]. He applied for his SSD and Medicaid to be reinstated and was approved. It will take 60 days to get the money. He reports good compliance with his HIV medication and last saw a [facility] doctor a week ago. Denies psychosis or anxiety. No other drugs involved.   On interview, he is lamenting his situation but does not appear depressed at all.  Past psychiatric history. Longe history of cocainism with multiple attempts to treat. Reports suicide attempts. His depression is always "situational". Responds well to Celexa and Zoloft.  Family psychiatric history. None reported.  Social history. As above. He is illiterate. Awaiting SSD checkand Medicaid.   Total Time spent with patient: 1 hour  Is the patient at risk to self? Yes.    Has the patient been a  risk to self in the past 6 months? Yes.    Has the patient been a risk to self within the distant past? Yes.    Is the patient a risk to others? No.  Has the patient been a risk to others in the past 6 months? No.  Has the patient been a risk to others within the distant past? No.   Prior Inpatient Therapy:   Prior Outpatient Therapy:    Alcohol Screening: 1. How often do you have a drink containing alcohol?: Never 2. How many drinks containing alcohol do you have on a typical day when you are drinking?: 1 or 2 3. How often do you have six or more drinks on one occasion?: Never AUDIT-C Score: 0 4. How often during the last year have you found that you were not able to stop drinking once you had started?: Never 5. How often during the last year have you failed to do what was normally expected from you becasue of drinking?: Never 6. How often during the last year have you needed a first drink in the morning to get yourself going after a heavy drinking session?: Never 7. How often during the last year have you had a feeling of guilt of remorse after drinking?: Never 8. How often during the last year have you been unable to remember what happened the night before because you had been drinking?: Never 9. Have you or someone else been injured as a result of your drinking?: No 10. Has a relative or friend or a doctor or another health worker been concerned about your drinking or suggested  you cut down?: No Alcohol Use Disorder Identification Test Final Score (AUDIT): 0 Alcohol Brief Interventions/Follow-up: AUDIT Score <7 follow-up not indicated, Alcohol Education Substance Abuse History in the last 12 months:  No. Consequences of Substance Abuse: Negative Previous Psychotropic Medications: Yes  Psychological Evaluations: No  Past Medical History:  Past Medical History:  Diagnosis Date  . AIDS (acquired immune deficiency syndrome) (HCC)   . Arthritis   . Asthma   . Bipolar disorder (HCC)    . Bronchitis   . Depression   . GERD (gastroesophageal reflux disease)   . Hepatitis C   . HIV (human immunodeficiency virus infection) (HCC)   . HTN (hypertension)     Past Surgical History:  Procedure Laterality Date  . HERNIA REPAIR    . TOE SURGERY     Family History:  Family History  Problem Relation Age of Onset  . Cancer Brother   . Uterine cancer Mother   . CAD Mother   . Hypertension Mother   . Hyperlipidemia Mother    Tobacco Screening: Have you used any form of tobacco in the last 30 days? (Cigarettes, Smokeless Tobacco, Cigars, and/or Pipes): Yes Tobacco use, Select all that apply: 4 or less cigarettes per day Are you interested in Tobacco Cessation Medications?: No, patient refused Counseled patient on smoking cessation including recognizing danger situations, developing coping skills and basic information about quitting provided: Yes Social History:  Social History   Substance and Sexual Activity  Alcohol Use Yes  . Alcohol/week: 4.0 standard drinks  . Types: 4 Cans of beer per week     Social History   Substance and Sexual Activity  Drug Use Yes  . Types: Cocaine    Additional Social History:                           Allergies:   Allergies  Allergen Reactions  . Lactose Other (See Comments)    GI distress  . Pollen Extract Itching   Lab Results:  Results for orders placed or performed during the hospital encounter of 02/19/19 (from the past 48 hour(s))  Comprehensive metabolic panel     Status: Abnormal   Collection Time: 02/19/19  9:57 AM  Result Value Ref Range   Sodium 136 135 - 145 mmol/L   Potassium 3.7 3.5 - 5.1 mmol/L   Chloride 102 98 - 111 mmol/L   CO2 23 22 - 32 mmol/L   Glucose, Bld 71 70 - 99 mg/dL   BUN 32 (H) 6 - 20 mg/dL   Creatinine, Ser 7.821.47 (H) 0.61 - 1.24 mg/dL   Calcium 9.3 8.9 - 95.610.3 mg/dL   Total Protein 8.6 (H) 6.5 - 8.1 g/dL   Albumin 4.5 3.5 - 5.0 g/dL   AST 86 (H) 15 - 41 U/L   ALT 36 0 - 44 U/L    Alkaline Phosphatase 75 38 - 126 U/L   Total Bilirubin 1.6 (H) 0.3 - 1.2 mg/dL   GFR calc non Af Amer 53 (L) >60 mL/min   GFR calc Af Amer >60 >60 mL/min   Anion gap 11 5 - 15    Comment: Performed at [facility], [address] [place], [place] [id number]  Ethanol     Status: None   Collection Time: 02/19/19  9:57 AM  Result Value Ref Range   Alcohol, Ethyl (B) <10 <10 mg/dL    Comment: (NOTE) Lowest detectable limit for serum alcohol is 10  mg/dL. For medical purposes only. Performed at [facility], [address] [place], [place] [address]   Salicylate level     Status: None   Collection Time: 02/19/19  9:57 AM  Result Value Ref Range   Salicylate Lvl <7.0 2.8 - 30.0 mg/dL    Comment: Performed at [facility], [address] [place], [place] [address]  Acetaminophen level     Status: Abnormal   Collection Time: 02/19/19  9:57 AM  Result Value Ref Range   Acetaminophen (Tylenol), Serum <10 (L) 10 - 30 ug/mL    Comment: (NOTE) Therapeutic concentrations vary significantly. A range of 10-30 ug/mL  may be an effective concentration for many patients. However, some  are best treated at concentrations outside of this range. Acetaminophen concentrations >150 ug/mL at 4 hours after ingestion  and >50 ug/mL at 12 hours after ingestion are often associated with  toxic reactions. Performed at [facility], [address] [place], [place] [address]   cbc     Status: None   Collection Time: 02/19/19  9:57 AM  Result Value Ref Range   WBC 6.4 4.0 - 10.5 K/uL   RBC 4.61 4.22 - 5.81 MIL/uL   Hemoglobin 15.0 13.0 - 17.0 g/dL   HCT 27.2 53.6 - 64.4 %   MCV 94.6 80.0 - 100.0 fL   MCH 32.5 26.0 - 34.0 pg   MCHC 34.4 30.0 - 36.0 g/dL   RDW 03.4 74.2 - 59.5 %   Platelets 245 150 - 400 K/uL   nRBC 0.0 0.0 - 0.2 %    Comment: Performed at [facility], [address] [place], [place] [address]  Urine Drug Screen, Qualitative      Status: Abnormal   Collection Time: 02/19/19  9:57 AM  Result Value Ref Range   Tricyclic, Ur Screen NONE DETECTED NONE DETECTED   Amphetamines, Ur Screen NONE DETECTED NONE DETECTED   MDMA (Ecstasy)Ur Screen NONE DETECTED NONE DETECTED   Cocaine Metabolite,Ur [place] POSITIVE (A) NONE DETECTED   Opiate, Ur Screen NONE DETECTED NONE DETECTED   Phencyclidine (PCP) Ur S NONE DETECTED NONE DETECTED   Cannabinoid 50 Ng, Ur [place] NONE DETECTED NONE DETECTED   Barbiturates, Ur Screen NONE DETECTED NONE DETECTED   Benzodiazepine, Ur Scrn NONE DETECTED NONE DETECTED   Methadone Scn, Ur NONE DETECTED NONE DETECTED    Comment: (NOTE) Tricyclics + metabolites, urine    Cutoff 1000 ng/mL Amphetamines + metabolites, urine  Cutoff 1000 ng/mL MDMA (Ecstasy), urine              Cutoff 500 ng/mL Cocaine Metabolite, urine          Cutoff 300 ng/mL Opiate + metabolites, urine        Cutoff 300 ng/mL Phencyclidine (PCP), urine         Cutoff 25 ng/mL Cannabinoid, urine                 Cutoff 50 ng/mL Barbiturates + metabolites, urine  Cutoff 200 ng/mL Benzodiazepine, urine              Cutoff 200 ng/mL Methadone, urine                   Cutoff 300 ng/mL The urine drug screen provides only a preliminary, unconfirmed analytical test result and should not be used for non-medical purposes. Clinical consideration and professional judgment should be applied to any positive drug screen result due to possible interfering substances.  A more specific alternate chemical method must be used in order to obtain a confirmed analytical result. Gas chromatography / mass spectrometry (GC/MS) is the preferred confirmat ory method. Performed at [facility], [address] [place], [place] [address]     Blood Alcohol level:  Lab Results  Component Value Date   [facility] <10 02/19/2019   ETH <10 05/26/2018    Metabolic Disorder Labs:  Lab Results  Component Value Date   HGBA1C 5.2 05/27/2018   MPG 102.54  05/27/2018   MPG 114 10/26/2016   No results found for: PROLACTIN Lab Results  Component Value Date   CHOL 158 01/23/2019   TRIG 63 01/23/2019   HDL 43 01/23/2019   CHOLHDL 3.7 01/23/2019   VLDL 13 01/23/2019   LDLCALC 102 (H) 01/23/2019   LDLCALC 67 05/27/2018    Current Medications: Current Facility-Administered Medications  Medication Dose Route Frequency Provider Last Rate Last Dose  . acetaminophen (TYLENOL) tablet 650 mg  650 mg Oral Q6H PRN Mariel Craft, MD      . albuterol (PROVENTIL) (2.5 MG/3ML) 0.083% nebulizer solution 2.5 mg  2.5 mg Inhalation Q4H PRN Mariel Craft, MD      . alum & mag hydroxide-simeth (MAALOX/MYLANTA) 200-200-20 MG/5ML suspension 30 mL  30 mL Oral Q4H PRN Mariel Craft, MD      . amLODipine (NORVASC) tablet 5 mg  5 mg Oral Daily Mariel Craft, MD   5 mg at 02/20/19 1914  . atorvastatin (LIPITOR) tablet 40 mg  40 mg Oral q1800 Mariel Craft, MD   40 mg at 02/19/19 1713  . bictegravir-emtricitabine-tenofovir AF (BIKTARVY) 50-200-25 MG per tablet 1 tablet  1 tablet Oral Daily Mariel Craft, MD   1 tablet at 02/20/19 [phone]  . gabapentin (NEURONTIN) capsule 300 mg  300 mg Oral TID Mariel Craft, MD   300 mg at 02/20/19 5621  . lactase (LACTAID) tablet 3,000 Units  3,000 Units Oral TID WC Mariel Craft, MD   3,000 Units at 02/20/19 [phone]  . lactulose (CHRONULAC) 10 GM/15ML solution 20 g  20 g Oral Daily Mariel Craft, MD   20 g at 02/20/19 5784  . lisinopril (PRINIVIL,ZESTRIL) tablet 20 mg  20 mg Oral Daily Mariel Craft, MD   20 mg at 02/20/19 0825  . magnesium hydroxide (MILK OF MAGNESIA) suspension 30 mL  30 mL Oral Daily PRN Mariel Craft, MD      . oxymetazoline (AFRIN) 0.05 % nasal spray 1 spray  1 spray Each Nare BID Mariel Craft, MD   1 spray at 02/20/19 1056  . sertraline (ZOLOFT) tablet 100 mg  100 mg Oral Daily Mariel Craft, MD   100 mg at 02/20/19 0825  . traZODone (DESYREL) tablet 300 mg  300 mg Oral QHS  Mariel Craft, MD   300 mg at 02/19/19 2136   PTA Medications: Medications Prior to Admission  Medication Sig Dispense Refill Last Dose  . albuterol (PROVENTIL HFA;VENTOLIN HFA) 108 (90 Base) MCG/ACT inhaler Inhale 2 puffs into the lungs every 4 (four) hours as needed for shortness of breath. 1 Inhaler 1 prn at prn  . amLODipine (NORVASC) 5 MG tablet Take 1 tablet (5 mg total) by mouth daily. 30 tablet 1 01/22/2019 at 0700  . atorvastatin (LIPITOR) 40 MG tablet Take 1 tablet (40 mg total) by mouth daily at 6 PM. 30 tablet 1 01/21/2019 at 1800  . bictegravir-emtricitabine-tenofovir AF (BIKTARVY) 50-200-25  MG TABS tablet Take 1 tablet by mouth daily.   01/22/2019 at 0700  . gabapentin (NEURONTIN) 300 MG capsule Take 1 capsule (300 mg total) by mouth 3 (three) times daily. 90 capsule 1 Past Week at Unknown time  . lactase (LACTAID) 3000 units tablet Take 3,000 Units by mouth 3 (three) times daily with meals.   01/21/2019 at 1800  . lactulose, encephalopathy, (CHRONULAC) 10 GM/15ML SOLN Take 20 g by mouth 2 (two) times daily as needed.   01/21/2019 at 2000  . lisinopril (PRINIVIL,ZESTRIL) 20 MG tablet Take 1 tablet (20 mg total) by mouth daily. 30 tablet 1 01/22/2019 at 0700  . oxymetazoline (AFRIN) 0.05 % nasal spray Place 1 spray into both nostrils 2 (two) times daily. 30 mL 1 prn at prn  . sertraline (ZOLOFT) 25 MG tablet Take 75 mg by mouth daily.   01/21/2019 at 1600  . traZODone (DESYREL) 300 MG tablet Take 1 tablet (300 mg total) by mouth at bedtime. 30 tablet 1     Musculoskeletal: Strength & Muscle Tone: within normal limits Gait & Station: normal Patient leans: N/A  Psychiatric Specialty Exam: I reviewed physical exam performed I the ER and agree with te findings. Physical Exam  Nursing note and vitals reviewed. Psychiatric: He has a normal mood and affect. His speech is normal and behavior is normal. Cognition and memory are normal. He expresses impulsivity. He expresses suicidal ideation. He  expresses suicidal plans.    Review of Systems  Neurological: Negative.   Psychiatric/Behavioral: Positive for depression, substance abuse and suicidal ideas.  All other systems reviewed and are negative.   Blood pressure 120/74, pulse 63, temperature 99 F (37.2 C), temperature source Oral, resp. rate 18, height 5\' 9"  (1.753 m), weight 94.3 kg, SpO2 98 %.Body mass index is 30.72 kg/m.  See SRA                                                  Sleep:  Number of Hours: 6.5    Treatment Plan Summary: Daily contact with patient to assess and evaluate symptoms and progress in treatment and Medication management   Mr. Holroyd is a 53 year old male with a history of depression, cocaine addiction, HIV and multiple social problems admitted for suicidal ideation with a plan to overdose on medication.  #Suicidal ideation, resolved -patient is able to contract for safety in the hospital  #Mood  -we will stop Zoloft and Trazodone as the may preclude him fro going into the program -Neurontin 300 mg TID  #HIV -continue Biktarvy  #HTN -Lisinopril 20 mg -Norvasc 5 mg  #Cocaie addiction -[facility] at [facility]  #Disposition -attempt to get a spot at [facility], medication regiment could be a problem there, will try to shorten the list -follow up with [facility]   Observation Level/Precautions:  15 minute checks  Laboratory:  CBC Chemistry Profile UDS UA  Psychotherapy:    Medications:    Consultations:    Discharge Concerns:    Estimated LOS:  Other:     Physician Treatment Plan for Primary Diagnosis: MDD (major depressive disorder), recurrent severe, without psychosis (HCC) Long Term Goal(s): Improvement in symptoms so as ready for discharge  Short Term Goals: Ability to identify changes in lifestyle to reduce recurrence of condition will improve, Ability to verbalize feelings will improve, Ability to disclose and discuss  suicidal ideas, Ability to demonstrate  self-control will improve, Ability to identify and develop effective coping behaviors will improve, Ability to maintain clinical measurements within normal limits will improve, Compliance with prescribed medications will improve and Ability to identify triggers associated with substance abuse/mental health issues will improve  Physician Treatment Plan for Secondary Diagnosis: Principal Problem:   MDD (major depressive disorder), recurrent severe, without psychosis (HCC) Active Problems:   HIV disease (HCC)   HTN (hypertension)   Cocaine use disorder, moderate, dependence (HCC)  Long Term Goal(s): Improvement in symptoms so as ready for discharge  Short Term Goals: Ability to identify changes in lifestyle to reduce recurrence of condition will improve, Ability to demonstrate self-control will improve, Ability to identify and develop effective coping behaviors will improve and Ability to identify triggers associated with substance abuse/mental health issues will improve  I certify that inpatient services furnished can reasonably be expected to improve the patient's condition.    Kristine Linea, MD 3/6/202012:29 PM

## 2019-02-19 NOTE — Tx Team (Signed)
Initial Treatment Plan 02/19/2019 5:53 PM Roy Kessner Olesen Sr. JEH:[id number]    PATIENT STRESSORS: Financial difficulties Substance abuse Other: homeless   PATIENT STRENGTHS: Ability for insight Communication skills Motivation for treatment/growth Supportive family/friends   PATIENT IDENTIFIED PROBLEMS: Financial difficulties   Substance abuse  Ineffective coping                 DISCHARGE CRITERIA:  Improved stabilization in mood, thinking, and/or behavior Motivation to continue treatment in a less acute level of care  PRELIMINARY DISCHARGE PLAN: Attend PHP/IOP Outpatient therapy Return to previous living arrangement  PATIENT/FAMILY INVOLVEMENT: This treatment plan has been presented to and reviewed with the patient, Roy Cassada Sr., and/or family member.  The patient and family have been given the opportunity to ask questions and make suggestions.  Leamon Arnt, RN 02/19/2019, 5:53 PM
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
## 2019-02-19 NOTE — ED Notes (Signed)
Patient went to xray 

## 2019-02-19 NOTE — ED Triage Notes (Signed)
Says he is feeling depressed and suicidal and worthless.  Called his caseworker at [facility] and they told him to come here.

## 2019-02-19 NOTE — ED Notes (Addendum)
Pt dressed out into appropriate behavioral health clothing with this tech and [employer] [place] in the rm. Pt belongings consist of a black wallet, a brown jacket, gray shoes, blue jeans, a black shirt, a black toboggan, a black flip phone, white socks, a white earring with a clear stone and a purple backpack with personal belongings. Pt calm and cooperative while dressing out. Pt denies having any cash in his wallet "just cards with numbers." Pt has two bags of belongings.

## 2019-02-20 NOTE — Plan of Care (Signed)
Patient newly admitted, hasn't had time to progress.   Problem: Education: Goal: Knowledge of [place] [facility] Education information/materials will improve Outcome: Not Progressing Goal: Emotional status will improve Outcome: Not Progressing Goal: Mental status will improve Outcome: Not Progressing Goal: Verbalization of understanding the information provided will improve Outcome: Not Progressing   Problem: Self-Concept: Goal: Will verbalize positive feelings about self Outcome: Not Progressing   Problem: Activity: Goal: Interest or engagement in leisure activities will improve Outcome: Not Progressing Goal: Imbalance in normal sleep/wake cycle will improve Outcome: Not Progressing
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
## 2019-02-20 NOTE — Tx Team (Addendum)
Interdisciplinary Treatment and Diagnostic Plan Update  02/20/2019 Time of Session: 1030am  [facility] Sr. MRN: [id number]  Principal Diagnosis: MDD (major depressive disorder), recurrent severe, without psychosis ([place])  Secondary Diagnoses: Principal Problem:   MDD (major depressive disorder), recurrent severe, without psychosis ([place]) Active Problems:   HIV disease ([place])   HTN (hypertension)   Cocaine use disorder, moderate, dependence ([place])   Current Medications:  Current Facility-Administered Medications  Medication Dose Route Frequency Provider Last Rate Last Dose  . acetaminophen (TYLENOL) tablet 650 mg  650 mg Oral Q6H PRN Lavella Hammock, MD      . alum & mag hydroxide-simeth (MAALOX/MYLANTA) 200-200-20 MG/5ML suspension 30 mL  30 mL Oral Q4H PRN Lavella Hammock, MD      . amLODipine (NORVASC) tablet 5 mg  5 mg Oral Daily Lavella Hammock, MD   5 mg at 02/20/19 3825  . atorvastatin (LIPITOR) tablet 40 mg  40 mg Oral q1800 Lavella Hammock, MD   40 mg at 02/19/19 1713  . bictegravir-emtricitabine-tenofovir AF (BIKTARVY) 50-200-25 MG per tablet 1 tablet  1 tablet Oral Daily Lavella Hammock, MD   1 tablet at 02/20/19 [phone]  . gabapentin (NEURONTIN) capsule 300 mg  300 mg Oral TID Lavella Hammock, MD   300 mg at 02/20/19 1256  . lactase (LACTAID) tablet 3,000 Units  3,000 Units Oral TID WC Lavella Hammock, MD   3,000 Units at 02/20/19 1256  . lactulose (CHRONULAC) 10 GM/15ML solution 20 g  20 g Oral Daily Lavella Hammock, MD   20 g at 02/20/19 7673  . lisinopril (PRINIVIL,ZESTRIL) tablet 20 mg  20 mg Oral Daily Lavella Hammock, MD   20 mg at 02/20/19 0825  . magnesium hydroxide (MILK OF MAGNESIA) suspension 30 mL  30 mL Oral Daily PRN Lavella Hammock, MD      . oxymetazoline (AFRIN) 0.05 % nasal spray 1 spray  1 spray Each Nare BID Lavella Hammock, MD   1 spray at 02/20/19 1056   PTA Medications: Medications Prior to Admission  Medication Sig Dispense Refill Last  Dose  . albuterol (PROVENTIL HFA;VENTOLIN HFA) 108 (90 Base) MCG/ACT inhaler Inhale 2 puffs into the lungs every 4 (four) hours as needed for shortness of breath. 1 Inhaler 1 prn at prn  . amLODipine (NORVASC) 5 MG tablet Take 1 tablet (5 mg total) by mouth daily. 30 tablet 1 01/22/2019 at 0700  . atorvastatin (LIPITOR) 40 MG tablet Take 1 tablet (40 mg total) by mouth daily at 6 PM. 30 tablet 1 01/21/2019 at 1800  . bictegravir-emtricitabine-tenofovir AF (BIKTARVY) 50-200-25 MG TABS tablet Take 1 tablet by mouth daily.   01/22/2019 at 0700  . gabapentin (NEURONTIN) 300 MG capsule Take 1 capsule (300 mg total) by mouth 3 (three) times daily. 90 capsule 1 Past Week at Unknown time  . lactase (LACTAID) 3000 units tablet Take 3,000 Units by mouth 3 (three) times daily with meals.   01/21/2019 at 1800  . lactulose, encephalopathy, (CHRONULAC) 10 GM/15ML SOLN Take 20 g by mouth 2 (two) times daily as needed.   01/21/2019 at 2000  . lisinopril (PRINIVIL,ZESTRIL) 20 MG tablet Take 1 tablet (20 mg total) by mouth daily. 30 tablet 1 01/22/2019 at 0700  . oxymetazoline (AFRIN) 0.05 % nasal spray Place 1 spray into both nostrils 2 (two) times daily. 30 mL 1 prn at prn  . sertraline (ZOLOFT) 25 MG tablet Take 75 mg by mouth daily.  01/21/2019 at 1600  . traZODone (DESYREL) 300 MG tablet Take 1 tablet (300 mg total) by mouth at bedtime. 30 tablet 1     Patient Stressors: Financial difficulties Substance abuse Other: homeless  Patient Strengths: Ability for Estate manager/land agent for treatment/growth Supportive family/friends  Treatment Modalities: Medication Management, Group therapy, Case management,  1 to 1 session with clinician, Psychoeducation, Recreational therapy.   Physician Treatment Plan for Primary Diagnosis: MDD (major depressive disorder), recurrent severe, without psychosis ([place]) Long Term Goal(s): Improvement in symptoms so as ready for discharge Improvement in symptoms so as ready  for discharge   Short Term Goals: Ability to identify changes in lifestyle to reduce recurrence of condition will improve Ability to verbalize feelings will improve Ability to disclose and discuss suicidal ideas Ability to demonstrate self-control will improve Ability to identify and develop effective coping behaviors will improve Ability to maintain clinical measurements within normal limits will improve Compliance with prescribed medications will improve Ability to identify triggers associated with substance abuse/mental health issues will improve Ability to identify changes in lifestyle to reduce recurrence of condition will improve Ability to demonstrate self-control will improve Ability to identify and develop effective coping behaviors will improve Ability to identify triggers associated with substance abuse/mental health issues will improve  Medication Management: Evaluate patient's response, side effects, and tolerance of medication regimen.  Therapeutic Interventions: 1 to 1 sessions, Unit Group sessions and Medication administration.  Evaluation of Outcomes: Not Met  Physician Treatment Plan for Secondary Diagnosis: Principal Problem:   MDD (major depressive disorder), recurrent severe, without psychosis ([place]) Active Problems:   HIV disease ([place])   HTN (hypertension)   Cocaine use disorder, moderate, dependence ([place])  Long Term Goal(s): Improvement in symptoms so as ready for discharge Improvement in symptoms so as ready for discharge   Short Term Goals: Ability to identify changes in lifestyle to reduce recurrence of condition will improve Ability to verbalize feelings will improve Ability to disclose and discuss suicidal ideas Ability to demonstrate self-control will improve Ability to identify and develop effective coping behaviors will improve Ability to maintain clinical measurements within normal limits will improve Compliance with prescribed medications will  improve Ability to identify triggers associated with substance abuse/mental health issues will improve Ability to identify changes in lifestyle to reduce recurrence of condition will improve Ability to demonstrate self-control will improve Ability to identify and develop effective coping behaviors will improve Ability to identify triggers associated with substance abuse/mental health issues will improve     Medication Management: Evaluate patient's response, side effects, and tolerance of medication regimen.  Therapeutic Interventions: 1 to 1 sessions, Unit Group sessions and Medication administration.  Evaluation of Outcomes: Not Met   RN Treatment Plan for Primary Diagnosis: MDD (major depressive disorder), recurrent severe, without psychosis ([place]) Long Term Goal(s): Knowledge of disease and therapeutic regimen to maintain health will improve  Short Term Goals: Ability to participate in decision making will improve, Ability to verbalize feelings will improve, Ability to disclose and discuss suicidal ideas, Ability to identify and develop effective coping behaviors will improve and Compliance with prescribed medications will improve  Medication Management: RN will administer medications as ordered by provider, will assess and evaluate patient's response and provide education to patient for prescribed medication. RN will report any adverse and/or side effects to prescribing provider.  Therapeutic Interventions: 1 on 1 counseling sessions, Psychoeducation, Medication administration, Evaluate responses to treatment, Monitor vital signs and CBGs as ordered, Perform/monitor CIWA, COWS, AIMS and Fall  Risk screenings as ordered, Perform wound care treatments as ordered.  Evaluation of Outcomes: Not Met   LCSW Treatment Plan for Primary Diagnosis: MDD (major depressive disorder), recurrent severe, without psychosis ([place]) Long Term Goal(s): Safe transition to appropriate next level of care at  discharge, Engage patient in therapeutic group addressing interpersonal concerns.  Short Term Goals: Engage patient in aftercare planning with referrals and resources  Therapeutic Interventions: Assess for all discharge needs, 1 to 1 time with Social worker, Explore available resources and support systems, Assess for adequacy in community support network, Educate family and significant other(s) on suicide prevention, Complete Psychosocial Assessment, Interpersonal group therapy.  Evaluation of Outcomes: Not Met   Progress in Treatment: Attending groups: Yes. Participating in groups: Yes. Taking medication as prescribed: Yes. Toleration medication: Yes. Family/Significant other contact made: No, will contact:  pt declined Patient understands diagnosis: Yes. Discussing patient identified problems/goals with staff: Yes. Medical problems stabilized or resolved: Yes. Denies suicidal/homicidal ideation: Yes. Issues/concerns per patient self-inventory: No. Other: NA  New problem(s) identified: No, Describe:  none reported  New Short Term/Long Term Goal(s):"Try to find somewhere to go"  Patient Goals:  "Try to find somewhere to go"  Discharge Plan or Barriers: Pt is homeless, no income, no supports, waiting on disability to be reinstated. Discharge plan TBD  Reason for Continuation of Hospitalization: Medication stabilization  Estimated Length of Stay:5-7 days  Recreational Therapy: Patient Stressors: N/A Patient Goal: Patient will identify 3 healthy boundaries to utilize post d/c within 5 recreation therapy group sessions  Attendees: Patient:Roy [name] 02/20/2019 1:20 PM  Physician: Herma Ard Pucilowska 02/20/2019 1:20 PM  Nursing: Nat Math Ravenell 02/20/2019 1:20 PM  RN Care Manager: 02/20/2019 1:20 PM  Social Worker: Darren Reita Chard Moton Michaela Stanback 02/20/2019 1:20 PM  Recreational Therapist: Isaias Sakai Bennett Ram 02/20/2019 1:20 PM  Other:  02/20/2019 1:20 PM  Other:  02/20/2019  1:20 PM  Other: 02/20/2019 1:20 PM    Scribe for Treatment Team: Yvette Rack, LCSW 02/20/2019 1:20 PM

## 2019-02-20 NOTE — Progress Notes (Signed)
D - Patient was in his room upon arrival to the unit. Patient was pleasant during assessment and medication administration. Patient denies SI/HI/AVH and pain. Patient stated, "My anxiety and depression are 7/10, because I am in here and I don't know where I am going to go when I get out of here." Patient presented with a sad affect but was pleasant and answered assessment questions.   A - Patient compliant with medication administration per MD orders and procedures on the unit. Patient given education. Patient given support and encouragement to be active in his treatment plan. Patient informed to let staff know if there are any problems or issues on the unit.   R - Patient being monitored Q 15 minutes for safety per unit protocol. Patient remains safe on the unit.

## 2019-02-20 NOTE — Plan of Care (Signed)
D: Patient has been sitting in the dayroom watching TV. Isolative to self. Denies SI, HI and AV hallucinations. Reports pain of 8/10 in left hip which he says needs to be replaced. He was medicated with Tylenol  per prn order. No further complaints. A: Continue to monitor for safety and offer support. R: Safety maintained.

## 2019-02-20 NOTE — Progress Notes (Signed)
Recreation Therapy Notes  INPATIENT RECREATION THERAPY ASSESSMENT  Patient Details Name: Roy [name] [facility] Sr. MRN: [id number] DOB: [birth year] Today's Date: 02/20/2019       Information Obtained From: Patient  Able to Participate in Assessment/Interview: Yes  Patient Presentation: Responsive  Reason for Admission (Per Patient): Active Symptoms, Suicidal Ideation  Patient Stressors:    Coping Skills:   Substance Abuse, Talk  Leisure Interests (2+):  Sports - Swimming, Art - Draw, Exercise - Walking(Go out to eat, horse back ridding)  Frequency of Recreation/Participation: Monthly  Awareness of Community Resources:  Yes  Community Resources:  [employer], [employer]  Current Use:    If no, Barriers?:    Expressed Interest in [employer] Information:    [place] of Residence:  Film/video editor  Patient Main Form of Transportation: Walk  Patient Strengths:  Nice, Kind  Patient Identified Areas of Improvement:  Getting my money and become independent  Patient Goal for Hospitalization:  A place to stay and feel better  Current SI (including self-harm):  No  Current HI:  No  Current AVH: No  Staff Intervention Plan: Group Attendance, Collaborate with Interdisciplinary Treatment Team  Consent to Intern Participation: N/A  Henri Baumler 02/20/2019, 3:33 PM

## 2019-02-20 NOTE — Plan of Care (Signed)
Patient is alert and oriented; denies SI, HI and AVH. Patient states he slept well last night, states his appetite has been poor, states his energy level is low and concentration is poor. Patient is pleasant and polite, takes medication without any issues.  Complains of depression and sadness.  No self harming behaviors noted. Problem: Education: Goal: Knowledge of [place] General Education information/materials will improve Outcome: Progressing Goal: Emotional status will improve Outcome: Progressing Goal: Mental status will improve Outcome: Progressing Goal: Verbalization of understanding the information provided will improve Outcome: Progressing   Problem: Self-Concept: Goal: Will verbalize positive feelings about self Outcome: Progressing   Problem: Activity: Goal: Interest or engagement in leisure activities will improve Outcome: Progressing Goal: Imbalance in normal sleep/wake cycle will improve Outcome: Progressing

## 2019-02-20 NOTE — Progress Notes (Addendum)
[facility] MD Progress Note  02/21/2019 3:35 PM Roy Joyce [facility] Sr.  MRN:  [id number]  Subjective:   Roy [name] had an interview wit [facility] which went well. Second part of the interview on Monday. But, he spoke with his cousin who will let him stay in his house until he gets his check. No longer interested in [facility] house. Restart Zoloft and Trazodone. Discharge Monday.  Principal Problem: MDD (major depressive disorder), recurrent severe, without psychosis ([facility]) Diagnosis: Principal Problem:   MDD (major depressive disorder), recurrent severe, without psychosis ([facility]) Active Problems:   HIV disease ([facility])   HTN (hypertension)   Cocaine use disorder, moderate, dependence ([facility])  Total Time spent with patient: 20 minutes  Past Psychiatric History: cocaine abuse, dapressiom  Past Medical History:  Past Medical History:  Diagnosis Date  . AIDS (acquired immune deficiency syndrome) ([facility])   . Arthritis   . Asthma   . Bipolar disorder ([facility])   . Bronchitis   . Depression   . GERD (gastroesophageal reflux disease)   . Hepatitis C   . HIV (human immunodeficiency virus infection) ([facility])   . HTN (hypertension)     Past Surgical History:  Procedure Laterality Date  . HERNIA REPAIR    . TOE SURGERY     Family History:  Family History  Problem Relation Age of Onset  . Cancer Brother   . Uterine cancer Mother   . CAD Mother   . Hypertension Mother   . Hyperlipidemia Mother    Family Psychiatric  History: none knwn Social History:  Social History   Substance and Sexual Activity  Alcohol Use Yes  . Alcohol/week: 4.0 standard drinks  . Types: 4 Cans of beer per week     Social History   Substance and Sexual Activity  Drug Use Yes  . Types: Cocaine    Social History   Socioeconomic History  . Marital status: Divorced    Spouse name: Not on file  . Number of children: Not on file  . Years of education: Not on file  . Highest education level: Not on file   Occupational History  . Not on file  Social Needs  . Financial resource strain: Not on file  . Food insecurity:    Worry: Not on file    Inability: Not on file  . Transportation needs:    Medical: Not on file    Non-medical: Not on file  Tobacco Use  . Smoking status: Never Smoker  . Smokeless tobacco: Never Used  Substance and Sexual Activity  . Alcohol use: Yes    Alcohol/week: 4.0 standard drinks    Types: 4 Cans of beer per week  . Drug use: Yes    Types: Cocaine  . Sexual activity: Yes  Lifestyle  . Physical activity:    Days per week: Not on file    Minutes per session: Not on file  . Stress: Not on file  Relationships  . Social connections:    Talks on phone: Not on file    Gets together: Not on file    Attends religious service: Not on file    Active member of club or organization: Not on file    Attends meetings of clubs or organizations: Not on file    Relationship status: Not on file  Other Topics Concern  . Not on file  Social History Narrative  . Not on file   Additional Social History:  Sleep: Fair  Appetite:  Fair  Current Medications: Current Facility-Administered Medications  Medication Dose Route Frequency Provider Last Rate Last Dose  . acetaminophen (TYLENOL) tablet 650 mg  650 mg Oral Q6H PRN Mariel Craft, MD   650 mg at 02/20/19 2117  . alum & mag hydroxide-simeth (MAALOX/MYLANTA) 200-200-20 MG/5ML suspension 30 mL  30 mL Oral Q4H PRN Mariel Craft, MD      . amLODipine (NORVASC) tablet 5 mg  5 mg Oral Daily Mariel Craft, MD   5 mg at 02/21/19 0818  . atorvastatin (LIPITOR) tablet 40 mg  40 mg Oral q1800 Mariel Craft, MD   40 mg at 02/20/19 1650  . bictegravir-emtricitabine-tenofovir AF (BIKTARVY) 50-200-25 MG per tablet 1 tablet  1 tablet Oral Daily Mariel Craft, MD   1 tablet at 02/21/19 0818  . gabapentin (NEURONTIN) capsule 300 mg  300 mg Oral TID Mariel Craft, MD   300 mg at  02/21/19 1159  . lactase (LACTAID) tablet 3,000 Units  3,000 Units Oral TID WC Mariel Craft, MD   3,000 Units at 02/21/19 1159  . lactulose (CHRONULAC) 10 GM/15ML solution 20 g  20 g Oral Daily Mariel Craft, MD   20 g at 02/21/19 0816  . lisinopril (PRINIVIL,ZESTRIL) tablet 20 mg  20 mg Oral Daily Mariel Craft, MD   20 mg at 02/21/19 0818  . magnesium hydroxide (MILK OF MAGNESIA) suspension 30 mL  30 mL Oral Daily PRN Mariel Craft, MD      . oxymetazoline (AFRIN) 0.05 % nasal spray 1 spray  1 spray Each Nare BID Mariel Craft, MD   1 spray at 02/21/19 [phone]  . sertraline (ZOLOFT) tablet 100 mg  100 mg Oral Daily Yashua Bracco B, MD   100 mg at 02/21/19 1201  . traZODone (DESYREL) tablet 100 mg  100 mg Oral QHS Sameeha Rockefeller B, MD        Lab Results:  No results found for this or any previous visit (from the past 48 hour(s)).  Blood Alcohol level:  Lab Results  Component Value Date   ETH <10 02/19/2019   ETH <10 05/26/2018    Metabolic Disorder Labs: Lab Results  Component Value Date   HGBA1C 5.2 05/27/2018   MPG 102.54 05/27/2018   MPG 114 10/26/2016   No results found for: PROLACTIN Lab Results  Component Value Date   CHOL 158 01/23/2019   TRIG 63 01/23/2019   HDL 43 01/23/2019   CHOLHDL 3.7 01/23/2019   VLDL 13 01/23/2019   LDLCALC 102 (H) 01/23/2019   LDLCALC 67 05/27/2018    Physical Findings: AIMS:  , ,  ,  ,    CIWA:    COWS:     Musculoskeletal: Strength & Muscle Tone: within normal limits Gait & Station: normal Patient leans: N/A  Psychiatric Specialty Exam: Physical Exam  Nursing note and vitals reviewed. Psychiatric: His speech is normal and behavior is normal. Thought content normal. His mood appears anxious. Cognition and memory are normal. He expresses impulsivity.    Review of Systems  Neurological: Negative.   Psychiatric/Behavioral: Positive for depression and substance abuse.  All other systems reviewed and are  negative.   Blood pressure (!) 137/93, pulse 64, temperature 98.4 F (36.9 C), temperature source Oral, resp. rate 16, height 5\' 9"  (1.753 m), weight 94.3 kg, SpO2 97 %.Body mass index is 30.72 kg/m.  General Appearance: Casual  Eye Contact:  Good  Speech:  Clear and Coherent  Volume:  Normal  Mood:  Depressed  Affect:  Flat  Thought Process:  Goal Directed and Descriptions of Associations: Intact  Orientation:  Full (Time, Place, and Person)  Thought Content:  WDL  Suicidal Thoughts:  No  Homicidal Thoughts:  No  Memory:  Immediate;   Fair Recent;   Fair Remote;   Fair  Judgement:  Poor  Insight:  Lacking  Psychomotor Activity:  Normal  Concentration:  Concentration: Fair and Attention Span: Fair  Recall:  [employer] of Knowledge:  Fair  Language:  Fair  Akathisia:  No  Handed:  Right  AIMS (if indicated):     Assets:  Communication Skills Desire for Improvement Resilience Social Support  ADL's:  Intact  Cognition:  WNL  Sleep:  Number of Hours: 7.25     Treatment Plan Summary: Daily contact with patient to assess and evaluate symptoms and progress in treatment and Medication management   Roy [name] is a 53 year old male with a history of depression, cocaine addiction, HIV and multiple social problems admitted for suicidal ideation with a plan to overdose on medication.  #Suicidal ideation, resolved -patient is able to contract for safety in the hospital  #Mood  -Zoloft 100 mg daily -Trazodone 100 mg nightly  -Neurontin 300 mg TID  #HIV -continue Biktarvy  #HTN -Lisinopril 20 mg -Norvasc 5 mg  #Cocaie addiction -discharge with family -[facility]IOP at [facility]  Kristine Linea, MD 02/21/2019, 3:35 PM

## 2019-02-20 NOTE — Progress Notes (Signed)
D: Patient has been sitting in the dayroom watching TV. Isolative to self. Denies SI, HI and AV hallucinations. Reports pain of 8/10 in left hip which he says needs to be replaced. He was medicated with Tylenol  per prn order. No further complaints. A: Continue to monitor for safety and offer support. R: Safety maintained. 

## 2019-02-20 NOTE — Progress Notes (Signed)
Recreation Therapy Notes   Date: 02/20/2019  Time: 9:30 am  Location: Craft Room  Behavioral response: Appropriate  Intervention Topic: Leisure  Discussion/Intervention:  Group content today was focused on leisure. The group defined what leisure is and some positive leisure activities they participate in. Individuals identified the difference between good and bad leisure. Participants expressed how they feel after participating in the leisure of their choice. The group discussed how they go about picking a leisure activity and if others are involved in their leisure activities. The patient stated how many leisure activities they too choose from and reasons why it is important to have leisure time. Individuals participated in the intervention "Leisure Jeopardy" where they had a chance to identify new leisure activities as well as benefits of leisure. Clinical Observations/Feedback:  Patient came to group late due to unknown reasons. Individual was social with peers and staff while participating in group.  Kirah Stice LRT/CTRS          Jannae Fagerstrom 02/20/2019 11:48 AM

## 2019-02-21 MED ORDER — TRAZODONE HCL 100 MG PO TABS
100.0000 mg | ORAL_TABLET | Freq: Every day | ORAL | Status: DC
  Administered 2019-02-21 – 2019-02-22 (×2): 100 mg via ORAL
  Filled 2019-02-21: qty 1

## 2019-02-21 MED ORDER — SERTRALINE HCL 100 MG PO TABS
100.0000 mg | ORAL_TABLET | Freq: Every day | ORAL | Status: DC
  Administered 2019-02-21 – 2019-02-23 (×3): 100 mg via ORAL
  Filled 2019-02-21 (×3): qty 1

## 2019-02-21 NOTE — [facility] Group Notes (Signed)
LCSW Group Therapy Note  02/21/2019 1:15pm  Type of Therapy and Topic:  Group Therapy:  Cognitive Distortions  Participation Level:  Active   Description of Group:    Patients in this group will be introduced to the topic of cognitive distortions.  Patients will identify and describe cognitive distortions, describe the feelings these distortions create for them.  Patients will identify one or more situations in their personal life where they have cognitively distorted thinking and will verbalize challenging this cognitive distortion through positive thinking skills.  Patients will practice the skill of using positive affirmations to challenge cognitive distortions using affirmation cards.    Therapeutic Goals:  1. Patient will identify two or more cognitive distortions they have used 2. Patient will identify one or more emotions that stem from use of a cognitive distortion 3. Patient will demonstrate use of a positive affirmation to counter a cognitive distortion through discussion and/or role play. 4. Patient will describe one way cognitive distortions can be detrimental to wellness   Summary of Patient Progress:  The patient reported that he  feels "depressed." Patients were introduced to the topic of cognitive distortions. The patient was able to identify and describe cognitive distortions. The patient identified a situation in their personal life where he  has cognitively distorted thinking and was able to verbalize and challenged this cognitive distortion through positive thinking skills. Patient was able to provide support and validation to other group members.     Therapeutic Modalities:   Cognitive Behavioral Therapy Motivational Interviewing   Madalena Kesecker  CUEBAS-COLON, LCSW 02/21/2019 12:06 PM

## 2019-02-21 NOTE — Plan of Care (Signed)
Patient present in the milieu with a slow gait and a noted limp. Observed up in the dayroom interacting with select peers. Compliant with treatment plan. Denies SI/HI/AVH and pain at this time. Rates his depression,anxiety and hopelessness a 10. With low energy and poor concentration. His goal for today is work with the Child psychotherapist in helping him find somewhere to stay and get disability. Milieu remains safe with q 15 minute safety checks.  Will continue to monitor.

## 2019-02-21 NOTE — Progress Notes (Signed)
D: [name] heart rate was 45 with blood pressure 137/93. Re-checked manually for one minute it was 48. Patient asymptomatic lying in bed. A: Contacted Dr. Donata Clay by phone and made aware. R: No new orders given, physician advised to continue to monitor.

## 2019-02-22 MED ORDER — LISINOPRIL 20 MG PO TABS: 20.0000 mg | ORAL_TABLET | Freq: Every day | ORAL | 1 refills | Status: DC

## 2019-02-22 MED ORDER — SERTRALINE HCL 100 MG PO TABS: 100.0000 mg | ORAL_TABLET | Freq: Every day | ORAL | 1 refills | Status: DC

## 2019-02-22 MED ORDER — AMLODIPINE BESYLATE 5 MG PO TABS: 5.0000 mg | ORAL_TABLET | Freq: Every day | ORAL | 1 refills | Status: DC

## 2019-02-22 MED ORDER — TRAZODONE HCL 100 MG PO TABS: 100.0000 mg | ORAL_TABLET | Freq: Every day | ORAL | 1 refills | Status: DC

## 2019-02-22 MED ORDER — LACTASE 3000 UNITS PO TABS: 3000.0000 [IU] | ORAL_TABLET | Freq: Three times a day (TID) | ORAL | 1 refills | Status: DC

## 2019-02-22 MED ORDER — ATORVASTATIN CALCIUM 40 MG PO TABS: 40.0000 mg | ORAL_TABLET | Freq: Every day | ORAL | 1 refills | Status: DC

## 2019-02-22 MED ORDER — GABAPENTIN 300 MG PO CAPS: 300.0000 mg | ORAL_CAPSULE | Freq: Three times a day (TID) | ORAL | 1 refills | Status: DC

## 2019-02-22 NOTE — Plan of Care (Signed)
D: Patient was socializing in the dayroom, playing cards with the other patients. Affect bright. Mood is pleasant. Denies SI, HI, and AV hallucinations. Contracts for safety on the unit. A: Continue to monitor for safety and offer support. R: Safety maintained. 

## 2019-02-22 NOTE — BHH Group Notes (Signed)
LCSW Group Therapy Note 02/22/2019 1:15pm  Type of Therapy and Topic: Group Therapy: Feelings Around Returning Home & Establishing a Supportive Framework and Supporting Oneself When Supports Not Available  Participation Level: Did Not Attend  Description of Group:  Patients first processed thoughts and feelings about upcoming discharge. These included fears of upcoming changes, lack of change, new living environments, judgements and expectations from others and overall stigma of mental health issues. The group then discussed the definition of a supportive framework, what that looks and feels like, and how do to discern it from an unhealthy non-supportive network. The group identified different types of supports as well as what to do when your family/friends are less than helpful or unavailable  Therapeutic Goals  1. Patient will identify one healthy supportive network that they can use at discharge. 2. Patient will identify one factor of a supportive framework and how to tell it from an unhealthy network. 3. Patient able to identify one coping skill to use when they do not have positive supports from others. 4. Patient will demonstrate ability to communicate their needs through discussion and/or role plays.  Summary of Patient Progress:  Pt was invited to attend group but chose not to attend. CSW will continue to encourage pt to attend group throughout their admission.   Therapeutic Modalities Cognitive Behavioral Therapy Motivational Interviewing   Roy [name]  CUEBAS-COLON, LCSW 02/22/2019 9:46 AM  

## 2019-02-22 NOTE — Plan of Care (Signed)
Patient present in the milieu with a slow gait and a noted limp. Observed up in the dayroom interacting with select peers. Compliant with treatment plan. Denies SI/HI/AVH and pain at this time. Rates his depression, anxiety and hopelessness as being high with low energy and poor concentration. His goal for today is to remain positive. Milieu remains safe with q 15 minute safety checks. Will continue to monitor.

## 2019-02-22 NOTE — BHH Suicide Risk Assessment (Addendum)
[facility] Discharge Suicide Risk Assessment   Principal Problem: MDD (major depressive disorder), recurrent severe, without psychosis (HCC) Discharge Diagnoses: Principal Problem:   MDD (major depressive disorder), recurrent severe, without psychosis (HCC) Active Problems:   HIV disease (HCC)   HTN (hypertension)   Cocaine use disorder, moderate, dependence (HCC)   Total Time spent with patient: 20 minutes  Musculoskeletal: Strength & Muscle Tone: within normal limits Gait & Station: normal Patient leans: N/A  Psychiatric Specialty Exam: Review of Systems  Neurological: Negative.   Psychiatric/Behavioral: Negative.   All other systems reviewed and are negative.   Blood pressure (!) 136/96, pulse (!) 49, temperature 98.1 F (36.7 C), temperature source Oral, resp. rate 18, height 5\' 9"  (1.753 m), weight 94.3 kg, SpO2 98 %.Body mass index is 30.72 kg/m.  General Appearance: Casual  Eye Contact::  Good  Speech:  Clear and Coherent409  Volume:  Normal  Mood:  Euthymic  Affect:  Appropriate  Thought Process:  Goal Directed and Descriptions of Associations: Intact  Orientation:  Full (Time, Place, and Person)  Thought Content:  WDL  Suicidal Thoughts:  No  Homicidal Thoughts:  No  Memory:  Immediate;   Fair Recent;   Fair Remote;   Fair  Judgement:  Impaired  Insight:  Present  Psychomotor Activity:  Normal  Concentration:  Fair  Recall:  [employer] of Knowledge:Fair  Language: Fair  Akathisia:  No  Handed:  Right  AIMS (if indicated):     Assets:  Communication Skills Desire for Improvement Housing Physical Health Resilience Social Support  Sleep:  Number of Hours: 6.5  Cognition: WNL  ADL's:  Intact   Mental Status Per Nursing Assessment::   On Admission:  Suicidal ideation indicated by patient, Self-harm thoughts  Demographic Factors:  Male and Unemployed  Loss Factors: Financial problems/change in socioeconomic status  Historical Factors: Prior suicide  attempts and Impulsivity  Risk Reduction Factors:   Sense of responsibility to family, Living with another person, especially a relative and Positive social support  Continued Clinical Symptoms:  Depression:   Impulsivity Alcohol/Substance Abuse/Dependencies  Cognitive Features That Contribute To Risk:  None    Suicide Risk:  Minimal: No identifiable suicidal ideation.  Patients presenting with no risk factors but with morbid ruminations; may be classified as minimal risk based on the severity of the depressive symptoms    Plan Of Care/Follow-up recommendations:  Activity:  AS TOLERATED Diet:  LOW SODIUM HEART HEALTHY Other:  KEEP FOLLOW UPAPPOINTMENTS  Kristine Linea, MD 02/23/2019, 8:10 AM

## 2019-02-22 NOTE — Progress Notes (Signed)
[facility] MD Progress Note  02/22/2019 10:00 PM Roy [name] Hasting Sr.  MRN:  [id number]  Subjective: Roy [name] is no longer homeless, suicidal or depressed. Discharge with a vousin tomorrow.   Principal Problem: MDD (major depressive disorder), recurrent severe, without psychosis (HCC) Diagnosis: Principal Problem:   MDD (major depressive disorder), recurrent severe, without psychosis (HCC) Active Problems:   HIV disease (HCC)   HTN (hypertension)   Cocaine use disorder, moderate, dependence (HCC)  Total Time spent with patient: 15 minutes  Past Psychiatric History: depresion, cocaine use  Past Medical History:  Past Medical History:  Diagnosis Date  . AIDS (acquired immune deficiency syndrome) (HCC)   . Arthritis   . Asthma   . Bipolar disorder (HCC)   . Bronchitis   . Depression   . GERD (gastroesophageal reflux disease)   . Hepatitis C   . HIV (human immunodeficiency virus infection) (HCC)   . HTN (hypertension)     Past Surgical History:  Procedure Laterality Date  . HERNIA REPAIR    . TOE SURGERY     Family History:  Family History  Problem Relation Age of Onset  . Cancer Brother   . Uterine cancer Mother   . CAD Mother   . Hypertension Mother   . Hyperlipidemia Mother    Family Psychiatric  History: none Social History:  Social History   Substance and Sexual Activity  Alcohol Use Yes  . Alcohol/week: 4.0 standard drinks  . Types: 4 Cans of beer per week     Social History   Substance and Sexual Activity  Drug Use Yes  . Types: Cocaine    Social History   Socioeconomic History  . Marital status: Divorced    Spouse name: Not on file  . Number of children: Not on file  . Years of education: Not on file  . Highest education level: Not on file  Occupational History  . Not on file  Social Needs  . Financial resource strain: Not on file  . Food insecurity:    Worry: Not on file    Inability: Not on file  . Transportation needs:    Medical: Not  on file    Non-medical: Not on file  Tobacco Use  . Smoking status: Never Smoker  . Smokeless tobacco: Never Used  Substance and Sexual Activity  . Alcohol use: Yes    Alcohol/week: 4.0 standard drinks    Types: 4 Cans of beer per week  . Drug use: Yes    Types: Cocaine  . Sexual activity: Yes  Lifestyle  . Physical activity:    Days per week: Not on file    Minutes per session: Not on file  . Stress: Not on file  Relationships  . Social connections:    Talks on phone: Not on file    Gets together: Not on file    Attends religious service: Not on file    Active member of club or organization: Not on file    Attends meetings of clubs or organizations: Not on file    Relationship status: Not on file  Other Topics Concern  . Not on file  Social History Narrative  . Not on file   Additional Social History:                         Sleep: Fair  Appetite:  Fair  Current Medications: Current Facility-Administered Medications  Medication Dose Route Frequency Provider Last Rate Last  Dose  . acetaminophen (TYLENOL) tablet 650 mg  650 mg Oral Q6H PRN Mariel Craft, MD   650 mg at 02/22/19 2026  . alum & mag hydroxide-simeth (MAALOX/MYLANTA) 200-200-20 MG/5ML suspension 30 mL  30 mL Oral Q4H PRN Mariel Craft, MD      . amLODipine (NORVASC) tablet 5 mg  5 mg Oral Daily Mariel Craft, MD   5 mg at 02/22/19 5277  . atorvastatin (LIPITOR) tablet 40 mg  40 mg Oral q1800 Mariel Craft, MD   40 mg at 02/22/19 1712  . bictegravir-emtricitabine-tenofovir AF (BIKTARVY) 50-200-25 MG per tablet 1 tablet  1 tablet Oral Daily Mariel Craft, MD   1 tablet at 02/22/19 [phone]  . gabapentin (NEURONTIN) capsule 300 mg  300 mg Oral TID Mariel Craft, MD   300 mg at 02/22/19 1623  . lactase (LACTAID) tablet 3,000 Units  3,000 Units Oral TID WC Mariel Craft, MD   3,000 Units at 02/22/19 1624  . lactulose (CHRONULAC) 10 GM/15ML solution 20 g  20 g Oral Daily Mariel Craft, MD   20 g at 02/22/19 [phone]  . lisinopril (PRINIVIL,ZESTRIL) tablet 20 mg  20 mg Oral Daily Mariel Craft, MD   20 mg at 02/22/19 1443  . magnesium hydroxide (MILK OF MAGNESIA) suspension 30 mL  30 mL Oral Daily PRN Mariel Craft, MD      . oxymetazoline (AFRIN) 0.05 % nasal spray 1 spray  1 spray Each Nare BID Mariel Craft, MD   1 spray at 02/22/19 0815  . sertraline (ZOLOFT) tablet 100 mg  100 mg Oral Daily Calahan Pak B, MD   100 mg at 02/22/19 0814  . traZODone (DESYREL) tablet 100 mg  100 mg Oral QHS Duaine Radin B, MD   100 mg at 02/22/19 2157    Lab Results: No results found for this or any previous visit (from the past 48 hour(s)).  Blood Alcohol level:  Lab Results  Component Value Date   ETH <10 02/19/2019   ETH <10 05/26/2018    Metabolic Disorder Labs: Lab Results  Component Value Date   HGBA1C 5.2 05/27/2018   MPG 102.54 05/27/2018   MPG 114 10/26/2016   No results found for: PROLACTIN Lab Results  Component Value Date   CHOL 158 01/23/2019   TRIG 63 01/23/2019   HDL 43 01/23/2019   CHOLHDL 3.7 01/23/2019   VLDL 13 01/23/2019   LDLCALC 102 (H) 01/23/2019   LDLCALC 67 05/27/2018    Physical Findings: AIMS:  , ,  ,  ,    CIWA:    COWS:     Musculoskeletal: Strength & Muscle Tone: within normal limits Gait & Station: normal Patient leans: N/A  Psychiatric Specialty Exam: Physical Exam  Nursing note and vitals reviewed. Psychiatric: He has a normal mood and affect. His speech is normal and behavior is normal. Judgment and thought content normal. Cognition and memory are normal.    Review of Systems  Neurological: Negative.   Psychiatric/Behavioral: Negative.   All other systems reviewed and are negative.   Blood pressure 122/80, pulse 74, temperature 98.1 F (36.7 C), temperature source Oral, resp. rate 16, height 5\' 9"  (1.753 m), weight 94.3 kg, SpO2 99 %.Body mass index is 30.72 kg/m.  General Appearance: Casual  Eye  Contact:  Good  Speech:  Clear and Coherent  Volume:  Normal  Mood:  Euthymic  Affect:  Appropriate  Thought Process:  Goal  Directed and Descriptions of Associations: Intact  Orientation:  Full (Time, Place, and Person)  Thought Content:  WDL  Suicidal Thoughts:  No  Homicidal Thoughts:  No  Memory:  Immediate;   Fair Recent;   Fair Remote;   Fair  Judgement:  Impaired  Insight:  Shallow  Psychomotor Activity:  Normal  Concentration:  Concentration: Fair and Attention Span: Fair  Recall:  [employer] of Knowledge:  Fair  Language:  Fair  Akathisia:  No  Handed:  Right  AIMS (if indicated):     Assets:  Communication Skills Desire for Improvement Physical Health Resilience Social Support  ADL's:  Intact  Cognition:  WNL  Sleep:  Number of Hours: 7     Treatment Plan Summary: Daily contact with patient to assess and evaluate symptoms and progress in treatment and Medication management   Mr. Decardenas is a 53 year old male with a history of depression, cocaine addiction, HIV and multiple social problems admitted for suicidal ideation with a plan to overdose on medication.  #Suicidal ideation, resolved -patient is able to contract for safety in the hospital  #Mood  -Zoloft 100 mg daily -Trazodone 100 mg nightly  -Neurontin 300 mg TID  #HIV -continue Biktarvy  #HTN -Lisinopril 20 mg -Norvasc 5 mg  #Cocaie addiction -discharge with family -SA[facility]OP at [facility]  Kristine Linea, MD 02/22/2019, 10:00 PM

## 2019-02-22 NOTE — Progress Notes (Signed)
D: Patient was socializing in the dayroom, playing cards with the other patients. Affect bright. Mood is pleasant. Denies SI, HI, and AV hallucinations. Contracts for safety on the unit. A: Continue to monitor for safety and offer support. R: Safety maintained.

## 2019-02-23 LAB — URINALYSIS, COMPLETE (UACMP) WITH MICROSCOPIC
Bacteria, UA: NONE SEEN
Bilirubin Urine: NEGATIVE
Glucose, UA: NEGATIVE mg/dL
Ketones, ur: NEGATIVE mg/dL
LEUKOCYTE UA: NEGATIVE
Nitrite: NEGATIVE
Protein, ur: NEGATIVE mg/dL
SPECIFIC GRAVITY, URINE: 1.013 (ref 1.005–1.030)
Squamous Epithelial / HPF: NONE SEEN (ref 0–5)
pH: 7 (ref 5.0–8.0)

## 2019-02-23 NOTE — Progress Notes (Signed)
Patient alert and oriented x 4. Ambulates unit with steady gait. Verbally denies SI/HI/AVH and pain. Patient discharged on above date and time. Verbalized understanding the discharge information provided to patient upon discharge. Patient departed unit with discharge paperwork, prescriptions, 7 day supply of medications and personal belongings. Picked up by his cousin to go home and follow up with [facility]. No complaints voiced.

## 2019-02-23 NOTE — Discharge Summary (Signed)
Physician Discharge Summary Note  Patient:  Roy Mowbray Sr. is a 53 y.o., male MRN:  [id number] DOB:  06/25/1966 Patient phone:  [phone] (home)  Patient address:   [address] [place] [address],  Total Time spent with patient: 20 minutes plus 15 min on care coordination and documentation  Date of Admission:  02/19/2019 Date of Discharge: 02/23/2019  Reason for Admission:  Suicidal ideation  History of Present Illness:   Identifying data. Roy [name] is a 53-year-old male with a history of cocaine addiction and depression.  Chief coplaint. "I just have nowhere to go."  History of present illness. Information was obtained from the patient and the chart. [name] came to the ER after cocaine binge complaining of chest pain. He then reported suicidal ideation with a plan to overdose on medications. He was admitted in the fall in exactly same scenario. At discharge, he was picked up by his PO for probation violation and send to jail. He completed his sentence in January and has been homeless since. While using cocaine he lost his placement at the [facility]. He applied for his SSD and Medicaid to be reinstated and was approved. It will take 60 days to get the money. He reports good compliance with his HIV medication and last saw a [facility] ID doctor a week ago. Denies psychosis or anxiety. No other drugs involved.   On interview, he is lamenting his situation but does not appear depressed at all.  Past psychiatric history. Longe history of cocainism with multiple attempts to treat. Reports suicide attempts. His depression is always "situational". Responds well to Celexa and Zoloft.  Family psychiatric history. None reported.  Social history. As above. He is illiterate. Awaiting SSD check and Medicaid.   Principal Problem: MDD (major depressive disorder), recurrent severe, without psychosis (HCC) Discharge Diagnoses: Principal Problem:   MDD (major depressive  disorder), recurrent severe, without psychosis (HCC) Active Problems:   HIV disease (HCC)   HTN (hypertension)   Cocaine use disorder, moderate, dependence (HCC)    Past Medical History:  Past Medical History:  Diagnosis Date  . AIDS (acquired immune deficiency syndrome) (HCC)   . Arthritis   . Asthma   . Bipolar disorder (HCC)   . Bronchitis   . Depression   . GERD (gastroesophageal reflux disease)   . Hepatitis C   . HIV (human immunodeficiency virus infection) (HCC)   . HTN (hypertension)     Past Surgical History:  Procedure Laterality Date  . HERNIA REPAIR    . TOE SURGERY     Family History:  Family History  Problem Relation Age of Onset  . Cancer Brother   . Uterine cancer Mother   . CAD Mother   . Hypertension Mother   . Hyperlipidemia Mother    Social History:  Social History   Substance and Sexual Activity  Alcohol Use Yes  . Alcohol/week: 4.0 standard drinks  . Types: 4 Cans of beer per week     Social History   Substance and Sexual Activity  Drug Use Yes  . Types: Cocaine    Social History   Socioeconomic History  . Marital status: Divorced    Spouse name: Not on file  . Number of children: Not on file  . Years of education: Not on file  . Highest education level: Not on file  Occupational History  . Not on file  Social Needs  . Financial resource strain: Not on file  . Food  insecurity:    Worry: Not on file    Inability: Not on file  . Transportation needs:    Medical: Not on file    Non-medical: Not on file  Tobacco Use  . Smoking status: Never Smoker  . Smokeless tobacco: Never Used  Substance and Sexual Activity  . Alcohol use: Yes    Alcohol/week: 4.0 standard drinks    Types: 4 Cans of beer per week  . Drug use: Yes    Types: Cocaine  . Sexual activity: Yes  Lifestyle  . Physical activity:    Days per week: Not on file    Minutes per session: Not on file  . Stress: Not on file  Relationships  . Social connections:     Talks on phone: Not on file    Gets together: Not on file    Attends religious service: Not on file    Active member of club or organization: Not on file    Attends meetings of clubs or organizations: Not on file    Relationship status: Not on file  Other Topics Concern  . Not on file  Social History Narrative  . Not on file    Hospital Course:    Roy [name] is a 53 year old male with a history of depression, cocaine addiction, HIV and multiple social problems admitted for suicidal ideation with a plan to overdose on medication. He was restarted on medications and tolerated them well. At the time of discharge, the patient is no longer suicidal. He is able to contract for safety. He is forward thinking and optimistic about the future.  #Suicidal ideation, resolved -patient is able to contract for safety in the hospital  #Mood  -Zoloft100 mg daily -Trazodone100 mg nightly -Neurontin 300 mg TID  #HIV -continue Biktarvy  #HTN -Lisinopril 20 mg -Norvasc 5 mg  #Cocaie addiction -discharge with family -SAIOP at [employer]  Physical Findings: AIMS:  , ,  ,  ,    CIWA:    COWS:     Musculoskeletal: Strength & Muscle Tone: within normal limits Gait & Station: normal Patient leans: N/A  Psychiatric Specialty Exam: Physical Exam  Nursing note and vitals reviewed. Psychiatric: He has a normal mood and affect. His speech is normal and behavior is normal. Thought content normal. Cognition and memory are normal. He expresses impulsivity.    Review of Systems  Neurological: Negative.   Psychiatric/Behavioral: Negative.   All other systems reviewed and are negative.   Blood pressure (!) 136/96, pulse (!) 49, temperature 98.1 F (36.7 C), temperature source Oral, resp. rate 18, height 5\' 9"  (1.753 m), weight 94.3 kg, SpO2 98 %.Body mass index is 30.72 kg/m.  General Appearance: Casual  Eye Contact:  Good  Speech:  Clear and Coherent  Volume:  Normal  Mood:  Euthymic   Affect:  Appropriate  Thought Process:  Goal Directed and Descriptions of Associations: Intact  Orientation:  Full (Time, Place, and Person)  Thought Content:  WDL  Suicidal Thoughts:  No  Homicidal Thoughts:  No  Memory:  Immediate;   Fair Recent;   Fair Remote;   Fair  Judgement:  Impaired  Insight:  Shallow  Psychomotor Activity:  Normal  Concentration:  Concentration: Fair and Attention Span: Fair  Recall:  [employer] of Knowledge:  Fair  Language:  Fair  Akathisia:  No  Handed:  Right  AIMS (if indicated):     Assets:  Communication Skills Desire for Improvement Housing Resilience Social Support  ADL's:  Intact  Cognition:  WNL  Sleep:  Number of Hours: 6.5     Have you used any form of tobacco in the last 30 days? (Cigarettes, Smokeless Tobacco, Cigars, and/or Pipes): Yes  Has this patient used any form of tobacco in the last 30 days? (Cigarettes, Smokeless Tobacco, Cigars, and/or Pipes) Yes, Yes, A prescription for an FDA-approved tobacco cessation medication was offered at discharge and the patient refused  Blood Alcohol level:  Lab Results  Component Value Date   [facility] <10 02/19/2019   ETH <10 05/26/2018    Metabolic Disorder Labs:  Lab Results  Component Value Date   HGBA1C 5.2 05/27/2018   MPG 102.54 05/27/2018   MPG 114 10/26/2016   No results found for: PROLACTIN Lab Results  Component Value Date   CHOL 158 01/23/2019   TRIG 63 01/23/2019   HDL 43 01/23/2019   CHOLHDL 3.7 01/23/2019   VLDL 13 01/23/2019   LDLCALC 102 (H) 01/23/2019   LDLCALC 67 05/27/2018    See Psychiatric Specialty Exam and Suicide Risk Assessment completed by Attending Physician prior to discharge.  Discharge destination:  Home  Is patient on multiple antipsychotic therapies at discharge:  No   Has Patient had three or more failed trials of antipsychotic monotherapy by history:  No  Recommended Plan for Multiple Antipsychotic Therapies: NA  Discharge Instructions     Diet - low sodium heart healthy   Complete by:  As directed    Increase activity slowly   Complete by:  As directed      Allergies as of 02/23/2019      Reactions   Lactose Other (See Comments)   GI distress   Pollen Extract Itching      Medication List    STOP taking these medications   oxymetazoline 0.05 % nasal spray Commonly known as:  AFRIN     TAKE these medications     Indication  albuterol 108 (90 Base) MCG/ACT inhaler Commonly known as:  PROVENTIL HFA;VENTOLIN HFA Inhale 2 puffs into the lungs every 4 (four) hours as needed for shortness of breath.  Indication:  Disease Involving Spasms of the Bronchus   amLODipine 5 MG tablet Commonly known as:  NORVASC Take 1 tablet (5 mg total) by mouth daily.  Indication:  High Blood Pressure Disorder   atorvastatin 40 MG tablet Commonly known as:  LIPITOR Take 1 tablet (40 mg total) by mouth daily at 6 PM.  Indication:  Ischemic Heart Disease   Biktarvy 50-200-25 MG Tabs tablet Generic drug:  bictegravir-emtricitabine-tenofovir AF Take 1 tablet by mouth daily.  Indication:  HIV Disease   gabapentin 300 MG capsule Commonly known as:  NEURONTIN Take 1 capsule (300 mg total) by mouth 3 (three) times daily.  Indication:  Neurogenic Pain   lactase 3000 units tablet Commonly known as:  LACTAID Take 1 tablet (3,000 Units total) by mouth 3 (three) times daily with meals.  Indication:  Inability of Body to Handle Lactose or Milk Sugar   lactulose (encephalopathy) 10 GM/15ML Soln Commonly known as:  CHRONULAC Take 20 g by mouth 2 (two) times daily as needed.  Indication:  Chronic Constipation   lisinopril 20 MG tablet Commonly known as:  PRINIVIL,ZESTRIL Take 1 tablet (20 mg total) by mouth daily.  Indication:  High Blood Pressure Disorder   sertraline 100 MG tablet Commonly known as:  ZOLOFT Take 1 tablet (100 mg total) by mouth daily. What changed:    medication strength  how  much to take  Indication:   Major Depressive Disorder   traZODone 100 MG tablet Commonly known as:  DESYREL Take 1 tablet (100 mg total) by mouth at bedtime. What changed:    medication strength  how much to take  Indication:  Trouble Sleeping        Follow-up recommendations:  Activity:  as tolerated Diet:  low sodium heart healthy Other:  keep follow up appointments  Comments:    Signed: Kristine Linea, MD 02/23/2019, 8:11 AM

## 2019-02-23 NOTE — Progress Notes (Signed)
Recreation Therapy Notes  INPATIENT RECREATION TR PLAN  Patient Details Name: Roy [name] [facility] Sr. MRN: [id number] DOB: 05/19/1966 Today's Date: 02/23/2019  Rec Therapy Plan Is patient appropriate for Therapeutic Recreation?: Yes Treatment times per week: at least 3 Estimated Length of Stay: 5-7days TR Treatment/Interventions: Group participation (Comment)  Discharge Criteria Pt will be discharged from therapy if:: Discharged Treatment plan/goals/alternatives discussed and agreed upon by:: Patient/family  Discharge Summary Short term goals set: Patient will identify 3 healthy boundaries to utilize post d/c within 5 recreation therapy group sessions Short term goals met: Adequate for discharge Progress toward goals comments: Groups attended Which groups?: Leisure education, Other (Comment)(Strengths) Reason goals not met: N/A Therapeutic equipment acquired: N/A Reason patient discharged from therapy: Discharge from hospital Pt/family agrees with progress & goals achieved: Yes Date patient discharged from therapy: 02/23/19   Atwell Mcdanel 02/23/2019, 1:13 PM

## 2019-02-23 NOTE — Progress Notes (Signed)
Recreation Therapy Notes   Date: 02/23/2019  Time: 9:30 am  Location: Craft Room  Behavioral response: Appropriate   Intervention Topic: Strengths  Discussion/Intervention:  Group content today was focused on strengths. The group identified some of the strengths they have. Individuals stated reason why they do not use their strengths. Patients expressed what strengths others see in them. The group identified important reason to use their strengths. The group participated in the intervention "Picking strengths", where they had a chance to identify some of their strengths. Clinical Observations/Feedback:  Patient came to group and identified common sense as a strength of his. He was pulled from group by Child psychotherapist and later returned. Individual was social with peers and staff while participating in group.  Negar Sieler LRT/CTRS          Lolamae Voisin 02/23/2019 1:09 PM

## 2019-02-23 NOTE — BHH Counselor (Signed)
CSW provided pt with a list of shelter resources. Pt was scheduled to have 2nd interview at 10am with [facility] but says he is no longer interested in going there.

## 2019-02-23 NOTE — Progress Notes (Signed)
  [facility] Adult Case Management Discharge Plan :  Will you be returning to the same living situation after discharge:  No. pt says he will be living with his nephew. At discharge, do you have transportation home?: Yes,  CSW assist with transportation Do you have the ability to pay for your medications: No.  Release of information consent forms completed and in the chart;  [name] signature needed at discharge.  Patient to Follow up at: Follow-up Information    [facility], [employer]. Go to.   Why:  Walk in clinic hours are Monday-Friday at 9am-4pm. Please bring photo ID, insurance card and medication. Thank you. Contact information: [address] [place] [address] [phone]           Next level of care provider has access to [facility] Link:no  Safety Planning and Suicide Prevention discussed: Yes,  with pt; pt declined family contact  Have you used any form of tobacco in the last 30 days? (Cigarettes, Smokeless Tobacco, Cigars, and/or Pipes): Yes  Has patient been referred to the Quitline?: N/A patient is not a smoker  Patient has been referred for addiction treatment: N/A  Suzan Slick, LCSW 02/23/2019, 9:57 AM

## 2019-02-24 LAB — URINE CULTURE: Culture: NO GROWTH

## 2019-03-21 ENCOUNTER — Encounter: Payer: Self-pay | Admitting: Emergency Medicine

## 2019-03-21 ENCOUNTER — Other Ambulatory Visit: Payer: Self-pay

## 2019-03-21 ENCOUNTER — Emergency Department
Admission: EM | Admit: 2019-03-21 | Discharge: 2019-03-23 | Disposition: A | Discharge status: Other Acute Inpatient Hospital | Payer: Medicaid Other | Attending: Emergency Medicine | Admitting: Emergency Medicine

## 2019-03-21 DIAGNOSIS — F329 Major depressive disorder, single episode, unspecified: Secondary | ICD-10-CM | POA: Diagnosis present

## 2019-03-21 DIAGNOSIS — R45851 Suicidal ideations: Secondary | ICD-10-CM | POA: Diagnosis not present

## 2019-03-21 DIAGNOSIS — Z21 Asymptomatic human immunodeficiency virus [HIV] infection status: Secondary | ICD-10-CM | POA: Diagnosis not present

## 2019-03-21 DIAGNOSIS — I10 Essential (primary) hypertension: Secondary | ICD-10-CM | POA: Diagnosis not present

## 2019-03-21 DIAGNOSIS — J45909 Unspecified asthma, uncomplicated: Secondary | ICD-10-CM | POA: Diagnosis not present

## 2019-03-21 DIAGNOSIS — F1410 Cocaine abuse, uncomplicated: Secondary | ICD-10-CM | POA: Insufficient documentation

## 2019-03-21 LAB — CBC
HCT: 46 % (ref 39.0–52.0)
Hemoglobin: 15.5 g/dL (ref 13.0–17.0)
MCH: 33.1 pg (ref 26.0–34.0)
MCHC: 33.7 g/dL (ref 30.0–36.0)
MCV: 98.3 fL (ref 80.0–100.0)
Platelets: 219 10*3/uL (ref 150–400)
RBC: 4.68 MIL/uL (ref 4.22–5.81)
RDW: 12.8 % (ref 11.5–15.5)
WBC: 3.2 10*3/uL — ABNORMAL LOW (ref 4.0–10.5)
nRBC: 0 % (ref 0.0–0.2)

## 2019-03-21 LAB — COMPREHENSIVE METABOLIC PANEL
ALT: 21 U/L (ref 0–44)
AST: 30 U/L (ref 15–41)
Albumin: 4.6 g/dL (ref 3.5–5.0)
Alkaline Phosphatase: 89 U/L (ref 38–126)
Anion gap: 11 (ref 5–15)
BUN: 26 mg/dL — ABNORMAL HIGH (ref 6–20)
CO2: 26 mmol/L (ref 22–32)
Calcium: 9.9 mg/dL (ref 8.9–10.3)
Chloride: 100 mmol/L (ref 98–111)
Creatinine, Ser: 2.4 mg/dL — ABNORMAL HIGH (ref 0.61–1.24)
GFR calc Af Amer: 34 mL/min — ABNORMAL LOW (ref >60)
GFR calc non Af Amer: 29 mL/min — ABNORMAL LOW (ref >60)
Glucose, Bld: 98 mg/dL (ref 70–99)
Potassium: 3.8 mmol/L (ref 3.5–5.1)
Sodium: 137 mmol/L (ref 135–145)
Total Bilirubin: 0.7 mg/dL (ref 0.3–1.2)
Total Protein: 8.7 g/dL — ABNORMAL HIGH (ref 6.5–8.1)

## 2019-03-21 LAB — ETHANOL: Alcohol, Ethyl (B): <10 mg/dL (ref <10)

## 2019-03-21 LAB — SALICYLATE LEVEL: Salicylate Lvl: <7 mg/dL (ref 2.8–30.0)

## 2019-03-21 LAB — ACETAMINOPHEN LEVEL: Acetaminophen (Tylenol), Serum: <10 ug/mL — ABNORMAL LOW (ref 10–30)

## 2019-03-21 NOTE — ED Triage Notes (Signed)
Pt to ED via POV, pt states that he is feeling suicidal. Pt states that he had a plan to take a bunch of tramadol, pt states that he just refilled his medication the other day. Pt states that he has been having these thoughts x 4-5 days. Pt states that he has smoked $700 worth of cocaine over the last 3 days. Pt is calm and cooperative in triage at this time.

## 2019-03-21 NOTE — ED Notes (Signed)
SOC in progress.  

## 2019-03-21 NOTE — BH Assessment (Signed)
Assessment Note  Roy Glas Sr. is an 53 y.o. male who presents to the ER due to having thoughts of ending his life. Patient states he have a history of "bad luck and I can't keep living like this..." Patient reports of getting his first disability check 03/18/2019 and was going to use it to get somewhere to live. However, he was unable to get an apartment, "all the boarding homes were full and the hotels went up on the prices..." Thus, he became overwhelmed and smoke approximately $700 in cocaine.  Patient is well known to the ER for similar presentation.  During the interview, the patient was calm, cooperative and pleasant. He was able to provide appropriate answers to the questions. Throughout the interview, he denied HI and AV/H.  Diagnosis: Depression  Past Medical History:  Past Medical History:  Diagnosis Date  . AIDS (acquired immune deficiency syndrome) (HCC)   . Arthritis   . Asthma   . Bipolar disorder (HCC)   . Bronchitis   . Depression   . GERD (gastroesophageal reflux disease)   . Hepatitis C   . HIV (human immunodeficiency virus infection) (HCC)   . HTN (hypertension)     Past Surgical History:  Procedure Laterality Date  . HERNIA REPAIR    . TOE SURGERY      Family History:  Family History  Problem Relation Age of Onset  . Cancer Brother   . Uterine cancer Mother   . CAD Mother   . Hypertension Mother   . Hyperlipidemia Mother     Social History:  reports that he has never smoked. He has never used smokeless tobacco. He reports current alcohol use of about 4.0 standard drinks of alcohol per week. He reports current drug use. Drug: Cocaine.  Additional Social History:  Alcohol / Drug Use Pain Medications: See PTA Prescriptions: See PTA Over the Counter: See PTA History of alcohol / drug use?: Yes Longest period of sobriety (when/how long): about 2.5 to 3 years  Negative Consequences of Use: Legal, Personal relationships Substance #1 Name of  Substance 1: Cocaine 1 - Age of First Use: 33 1 - Frequency: regularly prior to 3 years ago 1 - Last Use / Amount: 03/21/2019  CIWA: CIWA-Ar BP: (!) 94/38 Pulse Rate: (!) 102 COWS:    Allergies:  Allergies  Allergen Reactions  . Lactose Other (See Comments)    GI distress  . Pollen Extract Itching    Home Medications: (Not in a hospital admission)   OB/GYN Status:  No LMP for male patient.  General Assessment Data Location of Assessment: [facility] ED TTS Assessment: In system Is this a Tele or Face-to-Face Assessment?: Face-to-Face Is this an Initial Assessment or a Re-assessment for this encounter?: Initial Assessment Patient Accompanied by:: N/A Language Other than English: No Living Arrangements: Homeless/Shelter What gender do you identify as?: Male Marital status: Single Pregnancy Status: No Living Arrangements: Other (Comment) Can pt return to current living arrangement?: Yes Admission Status: Involuntary Petitioner: Police Is patient capable of signing voluntary admission?: No Referral Source: Self/Family/Friend Insurance type: Medicaid  Medical Screening Exam [facility] Walk-in ONLY) Medical Exam completed: Yes  Crisis Care Plan Living Arrangements: Other (Comment) Legal Guardian: Other:(Self) Name of Psychiatrist: Reports of none Name of Therapist: Reports of none  Education Status Is patient currently in school?: No Is the patient employed, unemployed or receiving disability?: Unemployed, Receiving disability income  Risk to self with the past 6 months Suicidal Ideation: Yes-Currently Present Has patient been  a risk to self within the past 6 months prior to admission? : Yes Suicidal Intent: No Has patient had any suicidal intent within the past 6 months prior to admission? : No Is patient at risk for suicide?: No Suicidal Plan?: Yes-Currently Present Has patient had any suicidal plan within the past 6 months prior to admission? : Yes Specify Current  Suicidal Plan: Overdose on drugs Access to Means: Yes Specify Access to Suicidal Means: Recently bought $700 worth of drugs What has been your use of drugs/alcohol within the last 12 months?: Cocaine Previous Attempts/Gestures: Yes How many times?: 1 Other Self Harm Risks: Reports of none Triggers for Past Attempts: Unknown Intentional Self Injurious Behavior: None Family Suicide History: Unknown Recent stressful life event(s): Other (Comment), Loss (Comment), Turmoil (Comment) Persecutory voices/beliefs?: No Depression: Yes Depression Symptoms: Isolating, Guilt, Feeling worthless/self pity, Feeling angry/irritable Substance abuse history and/or treatment for substance abuse?: Yes Suicide prevention information given to non-admitted patients: Not applicable  Risk to Others within the past 6 months Homicidal Ideation: No Does patient have any lifetime risk of violence toward others beyond the six months prior to admission? : No Thoughts of Harm to Others: No Current Homicidal Intent: No Current Homicidal Plan: No Access to Homicidal Means: No Identified Victim: Reports of none History of harm to others?: No Assessment of Violence: None Noted Violent Behavior Description: Reports of none Does patient have access to weapons?: No Criminal Charges Pending?: No Does patient have a court date: No Is patient on probation?: No  Psychosis Hallucinations: None noted Delusions: None noted  Mental Status Report Appearance/Hygiene: Unremarkable, In scrubs Eye Contact: Good Motor Activity: Freedom of movement, Unable to assess Speech: Logical/coherent, Unremarkable Level of Consciousness: Alert Mood: Sad, Pleasant Affect: Appropriate to circumstance, Sad Anxiety Level: Minimal Thought Processes: Coherent, Relevant Judgement: Unimpaired Orientation: Person, Place, Time, Situation, Appropriate for developmental age Obsessive Compulsive Thoughts/Behaviors: Minimal  Cognitive  Functioning Concentration: Normal Memory: Recent Intact, Remote Intact Is patient IDD: No Insight: Fair Impulse Control: Fair Appetite: Good Have you had any weight changes? : No Change Sleep: No Change Total Hours of Sleep: 8 Vegetative Symptoms: None  ADLScreening [facility] Assessment Services) [name] cognitive ability adequate to safely complete daily activities?: Yes Patient able to express need for assistance with ADLs?: Yes Independently performs ADLs?: Yes (appropriate for developmental age)  Prior Inpatient Therapy Prior Inpatient Therapy: Yes Prior Therapy Dates: 02/2019, 05/2018, 02/2017 & 10/2016 Prior Therapy Facilty/Provider(s): [facility] Reason for Treatment: Depression  Prior Outpatient Therapy Prior Outpatient Therapy: No Does patient have an ACCT team?: No Does patient have Intensive In-House Services?  : No Does patient have [facility] services? : No Does patient have P4CC services?: No  ADL Screening (condition at time of admission) [name] cognitive ability adequate to safely complete daily activities?: Yes Is the patient deaf or have difficulty hearing?: No Does the patient have difficulty seeing, even when wearing glasses/contacts?: No Does the patient have difficulty concentrating, remembering, or making decisions?: No Patient able to express need for assistance with ADLs?: Yes Does the patient have difficulty dressing or bathing?: No Independently performs ADLs?: Yes (appropriate for developmental age) Does the patient have difficulty walking or climbing stairs?: No Weakness of Legs: None Weakness of Arms/Hands: None  Home Assistive Devices/Equipment Home Assistive Devices/Equipment: None  Therapy Consults (therapy consults require a physician order) PT Evaluation Needed: No OT Evalulation Needed: No SLP Evaluation Needed: No Abuse/Neglect Assessment (Assessment to be complete while patient is alone) Abuse/Neglect Assessment Can Be Completed:  Yes Physical Abuse: Denies Verbal Abuse: Denies Sexual Abuse: Denies Exploitation of patient/patient's resources: Denies Self-Neglect: Denies Values / Beliefs Cultural Requests During Hospitalization: None Spiritual Requests During Hospitalization: None Consults Spiritual Care Consult Needed: No Social Work Consult Needed: No Merchant navy officer (For Healthcare) Does Patient Have a Medical Advance Directive?: No Would patient like information on creating a medical advance directive?: No - Patient declined       Child/Adolescent Assessment Running Away Risk: Denies(Patient is an adult)  Disposition:  Disposition Initial Assessment Completed for this Encounter: Yes  On Site Evaluation by:   Reviewed with Physician:    Lilyan Gilford MS, LCAS, LPMHCA, NCC, CCSI Therapeutic Triage Specialist 03/21/2019 4:22 PM

## 2019-03-21 NOTE — ED Provider Notes (Signed)
[facility] Emergency Department Provider Note  Time seen: 1:15 PM  I have reviewed the triage vital signs and the nursing notes.   HISTORY  Chief Complaint Suicidal   HPI Roy Marquez Sr. is a 53 y.o. male with a past medical history of arthritis, bipolar, gastric reflux, HIV, hypertension, substance abuse who presents to the emergency department for suicidal ideation and substance abuse.  According to the patient over the past 3 days he has attempted to overdose using crack cocaine.  States he smoked $700 worth of cocaine over the past 3 days.  Patient states he is having thoughts of killing himself due to his poor life choices per patient.  Denies any alcohol or other substances used.  Denies any use today states last use was last night.  Denies any medical complaints or concerns.  No fever cough congestion or recent travel.   Past Medical History:  Diagnosis Date  . AIDS (acquired immune deficiency syndrome) (HCC)   . Arthritis   . Asthma   . Bipolar disorder (HCC)   . Bronchitis   . Depression   . GERD (gastroesophageal reflux disease)   . Hepatitis C   . HIV (human immunodeficiency virus infection) (HCC)   . HTN (hypertension)     Patient Active Problem List   Diagnosis Date Noted  . MDD (major depressive disorder), recurrent episode, moderate (HCC) 02/19/2019  . MDD (major depressive disorder), recurrent severe, without psychosis (HCC) 02/19/2019  . Chest pain 05/27/2018  . Major depressive disorder, recurrent severe without psychotic features (HCC) 05/27/2018  . ARF (acute renal failure) (HCC) 04/08/2018  . Malingering 03/07/2017  . Cocaine use disorder, moderate, dependence (HCC) 02/19/2017  . Substance induced mood disorder (HCC) 02/19/2017  . HIV disease (HCC) 10/26/2016  . HTN (hypertension) 10/26/2016  . Dyslipidemia 10/26/2016  . BPH (benign prostatic hyperplasia) 10/26/2016  . Constipation 10/26/2016    Past Surgical History:   Procedure Laterality Date  . HERNIA REPAIR    . TOE SURGERY      Prior to Admission medications   Medication Sig Start Date End Date Taking? Authorizing Provider  albuterol (PROVENTIL HFA;VENTOLIN HFA) 108 (90 Base) MCG/ACT inhaler Inhale 2 puffs into the lungs every 4 (four) hours as needed for shortness of breath. 06/02/18   Pucilowska, Jolanta B, MD  amLODipine (NORVASC) 5 MG tablet Take 1 tablet (5 mg total) by mouth daily. 02/22/19   Pucilowska, Braulio Conte B, MD  atorvastatin (LIPITOR) 40 MG tablet Take 1 tablet (40 mg total) by mouth daily at 6 PM. 02/22/19   Pucilowska, Jolanta B, MD  bictegravir-emtricitabine-tenofovir AF (BIKTARVY) 50-200-25 MG TABS tablet Take 1 tablet by mouth daily.    [provider]  gabapentin (NEURONTIN) 300 MG capsule Take 1 capsule (300 mg total) by mouth 3 (three) times daily. 02/22/19   Pucilowska, Braulio Conte B, MD  lactase (LACTAID) 3000 units tablet Take 1 tablet (3,000 Units total) by mouth 3 (three) times daily with meals. 02/22/19   Pucilowska, Jolanta B, MD  lactulose, encephalopathy, (CHRONULAC) 10 GM/15ML SOLN Take 20 g by mouth 2 (two) times daily as needed.    [provider]  lisinopril (PRINIVIL,ZESTRIL) 20 MG tablet Take 1 tablet (20 mg total) by mouth daily. 02/22/19   Pucilowska, Braulio Conte B, MD  sertraline (ZOLOFT) 100 MG tablet Take 1 tablet (100 mg total) by mouth daily. 02/23/19   Pucilowska, Braulio Conte B, MD  traZODone (DESYREL) 100 MG tablet Take 1 tablet (100 mg total) by mouth  at bedtime. 02/23/19   Pucilowska, Ellin Goodie, MD    Allergies  Allergen Reactions  . Lactose Other (See Comments)    GI distress  . Pollen Extract Itching    Family History  Problem Relation Age of Onset  . Cancer Brother   . Uterine cancer Mother   . CAD Mother   . Hypertension Mother   . Hyperlipidemia Mother     Social History Social History   Tobacco Use  . Smoking status: Never Smoker  . Smokeless tobacco: Never Used  Substance Use Topics  .  Alcohol use: Yes    Alcohol/week: 4.0 standard drinks    Types: 4 Cans of beer per week  . Drug use: Yes    Types: Cocaine    Review of Systems Constitutional: Negative for fever. Cardiovascular: Negative for chest pain. Respiratory: Negative for shortness of breath. Gastrointestinal: Negative for abdominal pain Musculoskeletal: Negative for musculoskeletal complaints Skin: Negative for skin complaints  Neurological: Negative for headache All other ROS negative  ____________________________________________   PHYSICAL EXAM:  VITAL SIGNS: ED Triage Vitals [03/21/19 1243]  Enc Vitals Group     BP (!) 94/38     Pulse Rate (!) 102     Resp 16     Temp 97.6 F (36.4 C)     Temp Source Oral     SpO2 99 %     Weight 190 lb (86.2 kg)     Height 5\' 9"  (1.753 m)     Head Circumference      Peak Flow      Pain Score 5     Pain Loc      Pain Edu?      Excl. in GC?    Constitutional: Alert and oriented. Well appearing and in no distress. Eyes: Normal exam ENT   Head: Normocephalic and atraumatic.   Mouth/Throat: Mucous membranes are moist. Cardiovascular: Normal rate, regular rhythm. No murmur Respiratory: Normal respiratory effort without tachypnea nor retractions. Breath sounds are clear  Gastrointestinal: Soft and nontender. No distention.  Musculoskeletal: Nontender with normal range of motion in all extremities.  Neurologic:  Normal speech and language. No gross focal neurologic deficits  Skin:  Skin is warm, dry and intact.  Psychiatric: Mood and affect are normal  ____________________________________________   INITIAL IMPRESSION / ASSESSMENT AND PLAN / ED COURSE  Pertinent labs & imaging results that were available during my care of the patient were reviewed by me and considered in my medical decision making (see chart for details).  Patient presents emergency department for suicidal ideation and substance abuse.  States he has been using crack cocaine and  having thoughts of killing himself.  We will check labs, have psychiatry see the patient.  We will place under an IVC until psychiatry can adequately evaluate the patient given his suicidal ideation.  [name] labs do show worsening kidney function baseline creatinine around 1.4 currently 2.4.  The remainder of the lab work is pending.  Remainder the lab work has resulted largely within normal limits.  Psychiatric disposition pending. ____________________________________________   FINAL CLINICAL IMPRESSION(S) / ED DIAGNOSES  Substance abuse Suicidal ideation   Minna Antis, MD 03/21/19 [phone]

## 2019-03-21 NOTE — ED Notes (Signed)
Called SOC for consu.t 1850

## 2019-03-21 NOTE — ED Notes (Signed)
Gave patient turkey tray and drink.AS 

## 2019-03-21 NOTE — ED Notes (Signed)
Pt belonging placed in bag and pt dressed in hospital appropriate attire. Pt has with him the following items  1 brown and blue Jacket 1 pair of gray shoes 1 pair of black pants 1 black short sleeve shirt 1 gray hat 1 purple back pack labeled with patient tag

## 2019-03-21 NOTE — ED Notes (Addendum)
Patient assigned to appropriate care area   Introduced self to pt  Patient oriented to unit/care area: Informed that, for their safety, care areas are designed for safety and visiting and phone hours explained to patient. Patient verbalizes understanding, and verbal contract for safety obtained  Environment secured   Pt said he was feeling suicidal. Pt states that he had a plan to take a bunch of tramadol, pt states that he just refilled his medication the other day. Pt states that he has been having these thoughts x 4-5 days. Patient said he got a hotel room for the past 3 nights and  smoked $800 worth of cocaine over the last 3 days. Pt is calm and cooperative in triage at this time.

## 2019-03-22 LAB — URINE DRUG SCREEN, QUALITATIVE (ARMC ONLY)
Amphetamines, Ur Screen: NOT DETECTED
Barbiturates, Ur Screen: NOT DETECTED
Benzodiazepine, Ur Scrn: NOT DETECTED
Cannabinoid 50 Ng, Ur ~~LOC~~: NOT DETECTED
Cocaine Metabolite,Ur ~~LOC~~: POSITIVE — AB
MDMA (Ecstasy)Ur Screen: NOT DETECTED
Methadone Scn, Ur: NOT DETECTED
Opiate, Ur Screen: NOT DETECTED
Phencyclidine (PCP) Ur S: NOT DETECTED
Tricyclic, Ur Screen: NOT DETECTED

## 2019-03-22 LAB — BASIC METABOLIC PANEL
Anion gap: 8 (ref 5–15)
BUN: 32 mg/dL — ABNORMAL HIGH (ref 6–20)
CO2: 26 mmol/L (ref 22–32)
Calcium: 8.6 mg/dL — ABNORMAL LOW (ref 8.9–10.3)
Chloride: 104 mmol/L (ref 98–111)
Creatinine, Ser: 1.45 mg/dL — ABNORMAL HIGH (ref 0.61–1.24)
GFR calc Af Amer: >60 mL/min (ref >60)
GFR calc non Af Amer: 54 mL/min — ABNORMAL LOW (ref >60)
Glucose, Bld: 121 mg/dL — ABNORMAL HIGH (ref 70–99)
Potassium: 3.8 mmol/L (ref 3.5–5.1)
Sodium: 138 mmol/L (ref 135–145)

## 2019-03-22 MED ORDER — SODIUM CHLORIDE 0.9 % IV BOLUS
1000.0000 mL | Freq: Once | INTRAVENOUS | Status: AC
  Administered 2019-03-22: 1000 mL via INTRAVENOUS

## 2019-03-22 MED ORDER — ACETAMINOPHEN 325 MG PO TABS
650.0000 mg | ORAL_TABLET | Freq: Four times a day (QID) | ORAL | Status: DC | PRN
  Administered 2019-03-22: 650 mg via ORAL
  Filled 2019-03-22: qty 2

## 2019-03-22 NOTE — Progress Notes (Signed)
Patient given breakfast to eat.

## 2019-03-22 NOTE — ED Notes (Addendum)
Report on Situation, Background, Assessment, and Recommendations received from [place], [place]. Patient transferred to [facility]. Patient alert and in no visible distress, denies pain currently. Patient denies HI/AVH, but continues to endorse suicidal thoughts, but denies specific plan and/or intent to this Clinical research associate. Pt. Contracts for safety in the hospital. Pt. Endorses a depressed and anxious mood. Patient made aware of Q15 minute rounds and security cameras for their safety. Patient instructed to come to me with needs or concerns. Pt. Given orientation of the unit and room. Pt. Given fluids to drink.

## 2019-03-22 NOTE — ED Notes (Signed)
Referral information for Psychiatric Hospitalization faxed to;   [facility] [place] [place] [phone]),    . [facility] [phone] -or- [phone])  . [place] ([phone]---[phone]),  . [facility] [phone], [phone], [phone] or [phone]),   . [place] [phone] or [phone])  . [facility] [phone]),   . [name] [phone]),   . [facility] [phone])

## 2019-03-22 NOTE — ED Notes (Signed)
Hourly rounding reveals patient in room. No complaints, stable, in no acute distress. Q15 minute rounds and monitoring via Security Cameras to continue. 

## 2019-03-22 NOTE — Progress Notes (Signed)
Patient transferred to [facility]

## 2019-03-22 NOTE — ED Notes (Signed)
Pt. Instructed on need for UA sample per MD orders. Pt. Verbalizes he will comply when able to.

## 2019-03-22 NOTE — ED Notes (Signed)
Hourly rounding reveals patient sleeping in room. No complaints, stable, in no acute distress. Q15 minute rounds and monitoring via Security Cameras to continue. 

## 2019-03-22 NOTE — BH Assessment (Signed)
[facility]  [name]  [facility]) called requesting Demo-sheet for patient. Writer faxed demographics over to [phone]. Awaiting referral determination.

## 2019-03-22 NOTE — ED Provider Notes (Signed)
-----------------------------------------   12:40 AM on 03/22/2019 -----------------------------------------   Blood pressure (!) 94/38, pulse (!) 102, temperature 97.6 F (36.4 C), temperature source Oral, resp. rate 16, height 5\' 9"  (1.753 m), weight 86.2 kg, SpO2 99 %.  The patient is calm and cooperative at this time.  There have been no acute events since the last update.  Awaiting disposition plan from Behavioral Medicine team.    Arnaldo Natal, MD 03/22/19 0040

## 2019-03-22 NOTE — BH Assessment (Addendum)
    Roy [name] [phone]), Denied (Asher Muir)       [facility] [phone]-  [phone]), Denied (Cat)     [address] [phone]),  Denied Leotis Shames)     [place] [phone], [phone], [phone] or  [phone]), No beds Liborio Nixon)     [place] [phone] or [phone]), Pending review  Gala Murdoch)     [place] [phone]), Under review Karel Jarvis)     [name] [phone]), Denied-Chronicity Carron Brazen)

## 2019-03-22 NOTE — ED Notes (Signed)
Snack and beverage given. 

## 2019-03-22 NOTE — ED Notes (Signed)
IVC, pending placement 

## 2019-03-22 NOTE — ED Notes (Signed)
Report to include Situation, Background, Assessment, and Recommendations received from [facility]. Patient alert and oriented, warm and dry, in no acute distress. Patient denies HI, AVH and pain. Patient states he has SI without a plan. Patient made aware of Q15 minute rounds and security cameras for their safety. Patient instructed to come to me with needs or concerns.

## 2019-03-22 NOTE — ED Notes (Signed)
Pt. Given shower supplies and took a shower.

## 2019-03-23 ENCOUNTER — Inpatient Hospital Stay (HOSPITAL_COMMUNITY)
Admission: AD | Admit: 2019-03-23 | Discharge: 2019-03-26 | DRG: 897 | Disposition: A | Discharge status: Home / Self Care | Payer: Medicaid Other | Source: Intra-hospital | Attending: Psychiatry | Admitting: Psychiatry

## 2019-03-23 ENCOUNTER — Other Ambulatory Visit: Payer: Self-pay | Admitting: Family

## 2019-03-23 ENCOUNTER — Other Ambulatory Visit: Payer: Self-pay

## 2019-03-23 ENCOUNTER — Encounter (HOSPITAL_COMMUNITY): Payer: Self-pay

## 2019-03-23 DIAGNOSIS — F329 Major depressive disorder, single episode, unspecified: Secondary | ICD-10-CM | POA: Diagnosis not present

## 2019-03-23 DIAGNOSIS — G479 Sleep disorder, unspecified: Secondary | ICD-10-CM | POA: Diagnosis present

## 2019-03-23 DIAGNOSIS — F1424 Cocaine dependence with cocaine-induced mood disorder: Secondary | ICD-10-CM | POA: Diagnosis present

## 2019-03-23 DIAGNOSIS — F17210 Nicotine dependence, cigarettes, uncomplicated: Secondary | ICD-10-CM | POA: Diagnosis not present

## 2019-03-23 DIAGNOSIS — Z590 Homelessness: Secondary | ICD-10-CM | POA: Diagnosis not present

## 2019-03-23 DIAGNOSIS — F1994 Other psychoactive substance use, unspecified with psychoactive substance-induced mood disorder: Secondary | ICD-10-CM | POA: Diagnosis not present

## 2019-03-23 DIAGNOSIS — B1920 Unspecified viral hepatitis C without hepatic coma: Secondary | ICD-10-CM | POA: Diagnosis present

## 2019-03-23 DIAGNOSIS — Z79899 Other long term (current) drug therapy: Secondary | ICD-10-CM | POA: Diagnosis not present

## 2019-03-23 DIAGNOSIS — J45909 Unspecified asthma, uncomplicated: Secondary | ICD-10-CM | POA: Diagnosis present

## 2019-03-23 DIAGNOSIS — Z8349 Family history of other endocrine, nutritional and metabolic diseases: Secondary | ICD-10-CM

## 2019-03-23 DIAGNOSIS — F1420 Cocaine dependence, uncomplicated: Secondary | ICD-10-CM

## 2019-03-23 DIAGNOSIS — R45851 Suicidal ideations: Secondary | ICD-10-CM | POA: Diagnosis present

## 2019-03-23 DIAGNOSIS — Z91011 Allergy to milk products: Secondary | ICD-10-CM | POA: Diagnosis not present

## 2019-03-23 DIAGNOSIS — Z8249 Family history of ischemic heart disease and other diseases of the circulatory system: Secondary | ICD-10-CM

## 2019-03-23 DIAGNOSIS — F319 Bipolar disorder, unspecified: Secondary | ICD-10-CM | POA: Diagnosis present

## 2019-03-23 DIAGNOSIS — F419 Anxiety disorder, unspecified: Secondary | ICD-10-CM | POA: Diagnosis present

## 2019-03-23 DIAGNOSIS — Z8049 Family history of malignant neoplasm of other genital organs: Secondary | ICD-10-CM

## 2019-03-23 DIAGNOSIS — K219 Gastro-esophageal reflux disease without esophagitis: Secondary | ICD-10-CM | POA: Diagnosis present

## 2019-03-23 DIAGNOSIS — F339 Major depressive disorder, recurrent, unspecified: Secondary | ICD-10-CM | POA: Insufficient documentation

## 2019-03-23 DIAGNOSIS — B20 Human immunodeficiency virus [HIV] disease: Secondary | ICD-10-CM | POA: Diagnosis present

## 2019-03-23 DIAGNOSIS — R52 Pain, unspecified: Secondary | ICD-10-CM

## 2019-03-23 DIAGNOSIS — M1990 Unspecified osteoarthritis, unspecified site: Secondary | ICD-10-CM | POA: Diagnosis present

## 2019-03-23 DIAGNOSIS — I10 Essential (primary) hypertension: Secondary | ICD-10-CM | POA: Diagnosis present

## 2019-03-23 DIAGNOSIS — Z046 Encounter for general psychiatric examination, requested by authority: Secondary | ICD-10-CM | POA: Diagnosis present

## 2019-03-23 MED ORDER — ATORVASTATIN CALCIUM 20 MG PO TABS
40.0000 mg | ORAL_TABLET | Freq: Every day | ORAL | Status: DC
  Administered 2019-03-23: 40 mg via ORAL
  Filled 2019-03-23: qty 2

## 2019-03-23 MED ORDER — LACTULOSE 10 GM/15ML PO SOLN
20.0000 g | Freq: Two times a day (BID) | ORAL | Status: DC | PRN
  Filled 2019-03-23: qty 30

## 2019-03-23 MED ORDER — LISINOPRIL 20 MG PO TABS
20.0000 mg | ORAL_TABLET | Freq: Every day | ORAL | Status: DC
  Administered 2019-03-23: 20 mg via ORAL
  Filled 2019-03-23: qty 1

## 2019-03-23 MED ORDER — BICTEGRAVIR-EMTRICITAB-TENOFOV 50-200-25 MG PO TABS
1.0000 | ORAL_TABLET | Freq: Every day | ORAL | Status: DC
  Administered 2019-03-23: 1 via ORAL
  Filled 2019-03-23: qty 1

## 2019-03-23 MED ORDER — AMLODIPINE BESYLATE 5 MG PO TABS
5.0000 mg | ORAL_TABLET | Freq: Every day | ORAL | Status: DC
  Administered 2019-03-23: 12:00:00 5 mg via ORAL
  Filled 2019-03-23: qty 1

## 2019-03-23 MED ORDER — ALBUTEROL SULFATE HFA 108 (90 BASE) MCG/ACT IN AERS: 1.0000 | INHALATION_SPRAY | RESPIRATORY_TRACT | Status: DC | PRN

## 2019-03-23 MED ORDER — TRAZODONE HCL 100 MG PO TABS: 100.0000 mg | ORAL_TABLET | Freq: Every day | ORAL | Status: DC

## 2019-03-23 MED ORDER — ATORVASTATIN CALCIUM 10 MG PO TABS
10.0000 mg | ORAL_TABLET | Freq: Every day | ORAL | Status: DC
  Administered 2019-03-24 – 2019-03-25 (×2): 10 mg via ORAL
  Filled 2019-03-23 (×4): qty 1

## 2019-03-23 MED ORDER — SERTRALINE HCL 100 MG PO TABS
100.0000 mg | ORAL_TABLET | Freq: Every day | ORAL | Status: DC
  Administered 2019-03-24: 100 mg via ORAL
  Filled 2019-03-23 (×2): qty 1

## 2019-03-23 MED ORDER — ALBUTEROL SULFATE HFA 108 (90 BASE) MCG/ACT IN AERS
2.0000 | INHALATION_SPRAY | RESPIRATORY_TRACT | Status: DC | PRN
  Filled 2019-03-23: qty 6.7

## 2019-03-23 MED ORDER — ACETAMINOPHEN ER 650 MG PO TBCR: 650.0000 mg | EXTENDED_RELEASE_TABLET | Freq: Three times a day (TID) | ORAL | Status: DC | PRN

## 2019-03-23 MED ORDER — AMLODIPINE BESYLATE 5 MG PO TABS
5.0000 mg | ORAL_TABLET | Freq: Every day | ORAL | Status: DC
  Administered 2019-03-24 – 2019-03-26 (×3): 5 mg via ORAL
  Filled 2019-03-23 (×5): qty 1

## 2019-03-23 MED ORDER — MAGNESIUM HYDROXIDE 400 MG/5ML PO SUSP: 30.0000 mL | Freq: Every day | ORAL | Status: DC | PRN

## 2019-03-23 MED ORDER — LACTASE 3000 UNITS PO TABS
3000.0000 [IU] | ORAL_TABLET | Freq: Three times a day (TID) | ORAL | Status: DC
  Administered 2019-03-24 – 2019-03-26 (×8): 3000 [IU] via ORAL
  Filled 2019-03-23 (×14): qty 1

## 2019-03-23 MED ORDER — ALUM & MAG HYDROXIDE-SIMETH 200-200-20 MG/5ML PO SUSP: 30.0000 mL | ORAL | Status: DC | PRN

## 2019-03-23 MED ORDER — LACTASE 3000 UNITS PO TABS
3000.0000 [IU] | ORAL_TABLET | Freq: Three times a day (TID) | ORAL | Status: DC
  Administered 2019-03-23 (×2): 3000 [IU] via ORAL
  Filled 2019-03-23 (×2): qty 1

## 2019-03-23 MED ORDER — SERTRALINE HCL 100 MG PO TABS
100.0000 mg | ORAL_TABLET | Freq: Every day | ORAL | Status: DC
  Administered 2019-03-23: 100 mg via ORAL
  Filled 2019-03-23: qty 1

## 2019-03-23 MED ORDER — GABAPENTIN 300 MG PO CAPS
300.0000 mg | ORAL_CAPSULE | Freq: Two times a day (BID) | ORAL | Status: DC
  Administered 2019-03-23 – 2019-03-26 (×6): 300 mg via ORAL
  Filled 2019-03-23 (×11): qty 1

## 2019-03-23 MED ORDER — GABAPENTIN 300 MG PO CAPS
300.0000 mg | ORAL_CAPSULE | Freq: Three times a day (TID) | ORAL | Status: DC
  Administered 2019-03-23: 300 mg via ORAL
  Filled 2019-03-23: qty 1

## 2019-03-23 MED ORDER — ACETAMINOPHEN 325 MG PO TABS
650.0000 mg | ORAL_TABLET | Freq: Four times a day (QID) | ORAL | Status: DC | PRN
  Administered 2019-03-23 – 2019-03-25 (×4): 650 mg via ORAL
  Filled 2019-03-23 (×4): qty 2

## 2019-03-23 MED ORDER — TRAZODONE HCL 100 MG PO TABS
100.0000 mg | ORAL_TABLET | Freq: Every evening | ORAL | Status: DC | PRN
  Administered 2019-03-23 – 2019-03-24 (×2): 100 mg via ORAL
  Filled 2019-03-23 (×2): qty 1

## 2019-03-23 MED ORDER — ENSURE ENLIVE PO LIQD
237.0000 mL | Freq: Two times a day (BID) | ORAL | Status: DC
  Administered 2019-03-24 – 2019-03-26 (×5): 237 mL via ORAL

## 2019-03-23 NOTE — ED Notes (Signed)
Hourly rounding reveals patient sleeping in room. No complaints, stable, in no acute distress. Q15 minute rounds and monitoring via Security Cameras to continue. 

## 2019-03-23 NOTE — ED Notes (Signed)
Breakfast tray given.  Hand hygiene encouraged.   

## 2019-03-23 NOTE — ED Notes (Signed)
emtala reviewed by charge RN 

## 2019-03-23 NOTE — ED Provider Notes (Signed)
-----------------------------------------   1:11 PM on 03/23/2019 -----------------------------------------   BP (!) 154/94   Pulse 60   Temp 98.2 F (36.8 C) (Oral)   Resp 16   Ht 1.753 m (5\' 9" )   Wt 86.2 kg   SpO2 100%   BMI 28.06 kg/m   No acute events overnight. Vitals reviewed. Patient remains medically cleared.  Disposition is pending per Psychiatry/Behavioral Medicine team recommendations.    Jene Every, MD 03/23/19 1311

## 2019-03-23 NOTE — Progress Notes (Signed)
Pt accepted to [facility], room 406-1 Dr. Lucianne Muss is the accepting provider.   Dr. Jama Flavors is the attending provider.   Call report to [phone]   [email or web address] ED notified.    Pt is involuntary and will be transported by law enforcement Pt is scheduled to arrive at [facility] at 8pm.    Wells Guiles, LCSW, LCAS Disposition CSW [facility]/TTS [phone]

## 2019-03-23 NOTE — Tx Team (Signed)
Initial Treatment Plan 03/23/2019 10:11 PM Roy Garringer Leabo Sr. PHX:[phone]    PATIENT STRESSORS: Financial difficulties Health problems Marital or family conflict Substance abuse   PATIENT STRENGTHS: Wellsite geologist fund of knowledge Motivation for treatment/growth   PATIENT IDENTIFIED PROBLEMS: "I need to get away from my hometown and old friends" "drug use"                     DISCHARGE CRITERIA:  Need for constant or close observation no longer present Reduction of life-threatening or endangering symptoms to within safe limits  PRELIMINARY DISCHARGE PLAN: Placement in alternative living arrangements  PATIENT/FAMILY INVOLVEMENT: This treatment plan has been presented to and reviewed with the patient, Roy Borey Sr..  The patient and family have been given the opportunity to ask questions and make suggestions.  Edwyna Perfect, RN 03/23/2019, 10:11 PM

## 2019-03-24 LAB — TSH: TSH: 0.675 u[IU]/mL (ref 0.350–4.500)

## 2019-03-24 MED ORDER — ADULT MULTIVITAMIN W/MINERALS CH
1.0000 | ORAL_TABLET | Freq: Every day | ORAL | Status: DC
  Administered 2019-03-24 – 2019-03-26 (×3): 1 via ORAL
  Filled 2019-03-24 (×5): qty 1

## 2019-03-24 MED ORDER — CITALOPRAM HYDROBROMIDE 20 MG PO TABS
20.0000 mg | ORAL_TABLET | Freq: Every day | ORAL | Status: DC
  Administered 2019-03-24 – 2019-03-25 (×2): 20 mg via ORAL
  Filled 2019-03-24 (×3): qty 1

## 2019-03-24 MED ORDER — BICTEGRAVIR-EMTRICITAB-TENOFOV 50-200-25 MG PO TABS
1.0000 | ORAL_TABLET | Freq: Every day | ORAL | Status: DC
  Administered 2019-03-24 – 2019-03-26 (×3): 1 via ORAL
  Filled 2019-03-24 (×5): qty 1

## 2019-03-24 NOTE — BHH Suicide Risk Assessment (Signed)
B[facility] INPATIENT:  Family/Significant Other Suicide Prevention Education  Suicide Prevention Education:   Patient Refusal for Family/Significant Other Suicide Prevention Education: The patient Roy Wand Sr. has refused to provide written consent for family/significant other to be provided Family/Significant Other Suicide Prevention Education during admission and/or prior to discharge.  Physician notified.  SPE completed with patient, as patient refused to consent to family contact. SPI pamphlet provided to pt and pt was encouraged to share information with support network, ask questions, and talk about any concerns relating to SPE. Patient denies access to guns/firearms and verbalized understanding of information provided. Mobile Crisis information also provided to patient.     [name] 03/24/2019, 11:05 AM

## 2019-03-24 NOTE — H&P (Signed)
Psychiatric Admission Assessment Adult  Patient Identification: Weston Kallman Dirocco Sr. MRN:  [id number] Date of Evaluation:  03/24/2019 Chief Complaint:  mdd recurrent severe Principal Diagnosis: <principal problem not specified> Diagnosis:  Active Problems:   MDD (major depressive disorder), recurrent episode (HCC)  History of Present Illness: Patient is seen and examined.  Patient is a 53 year old male with a past psychiatric history significant for cocaine dependence who presented to the [facility] with suicidal ideation.  The patient stated he had just gotten his check on April 1, but became suicidal and tried to "overdose on cocaine".  He stated he blew all of his money on cocaine.  He stated at that point he considered taking trazodone overdose, but decided to go to the emergency room for help.  The patient stated that he had just gotten out of prison somewhere between January and March of this year.  He stated he had been suicidal since then because he was homeless.  His last psychiatric hospitalizations were at [facility] and [facility] both in March.  He was discharged on Zoloft and trazodone.  He stated that he has had bad luck his whole life and needed to find a place to live.  He had been at an [facility], but he owes them to thousand dollars and was unable to get back in there until he was able to pay that.  He is followed at the HIV clinic in [place].  He was hospitalized at [facility] between 3/15 and 3/23.  This again was for suicidal ideation.  He was discharged to a homeless shelter at that time.  He was discharged on trazodone and citalopram.  He was at 40 mg a day for that.  He was admitted to the hospital for evaluation and stabilization.  Associated Signs/Symptoms: Depression Symptoms:  anhedonia, fatigue, difficulty concentrating, suicidal thoughts without plan, anxiety, loss of energy/fatigue, disturbed sleep, (Hypo) Manic Symptoms:  Irritable  Mood, Anxiety Symptoms:  Excessive Worry, Psychotic Symptoms:  denied PTSD Symptoms: Negative Total Time spent with patient: 30 minutes  Past Psychiatric History: Patient admitted to multiple psychiatric hospitalizations in the past.  He was released from prison sometime between January and March 2020.  Since that time he has had 2 psychiatric hospitalizations, both in March.  1 at [facility], and the other at [facility].  Is the patient at risk to self? Yes.    Has the patient been a risk to self in the past 6 months? Yes.    Has the patient been a risk to self within the distant past? Yes.    Is the patient a risk to others? No.  Has the patient been a risk to others in the past 6 months? No.  Has the patient been a risk to others within the distant past? No.   Prior Inpatient Therapy:   Prior Outpatient Therapy:    Alcohol Screening: 1. How often do you have a drink containing alcohol?: Never 2. How many drinks containing alcohol do you have on a typical day when you are drinking?: 1 or 2 3. How often do you have six or more drinks on one occasion?: Never AUDIT-C Score: 0 9. Have you or someone else been injured as a result of your drinking?: No 10. Has a relative or friend or a doctor or another health worker been concerned about your drinking or suggested you cut down?: No Alcohol Use Disorder Identification Test Final Score (AUDIT): 0 Alcohol Brief Interventions/Follow-up: AUDIT Score <7  follow-up not indicated Substance Abuse History in the last 12 months:  Yes.   Consequences of Substance Abuse: NA Previous Psychotropic Medications: Yes  Psychological Evaluations: Yes  Past Medical History:  Past Medical History:  Diagnosis Date  . AIDS (acquired immune deficiency syndrome) (HCC)   . Arthritis   . Asthma   . Bipolar disorder (HCC)   . Bronchitis   . Depression   . GERD (gastroesophageal reflux disease)   . Hepatitis C   . HIV (human immunodeficiency virus infection)  (HCC)   . HTN (hypertension)     Past Surgical History:  Procedure Laterality Date  . HERNIA REPAIR    . TOE SURGERY     Family History:  Family History  Problem Relation Age of Onset  . Cancer Brother   . Uterine cancer Mother   . CAD Mother   . Hypertension Mother   . Hyperlipidemia Mother    Family Psychiatric  History: Noncontributory Tobacco Screening: Have you used any form of tobacco in the last 30 days? (Cigarettes, Smokeless Tobacco, Cigars, and/or Pipes): Yes Tobacco use, Select all that apply: 4 or less cigarettes per day Are you interested in Tobacco Cessation Medications?: No, patient refused Counseled patient on smoking cessation including recognizing danger situations, developing coping skills and basic information about quitting provided: Yes Social History:  Social History   Substance and Sexual Activity  Alcohol Use Yes  . Alcohol/week: 4.0 standard drinks  . Types: 4 Cans of beer per week     Social History   Substance and Sexual Activity  Drug Use Yes  . Types: Cocaine    Additional Social History:                           Allergies:   Allergies  Allergen Reactions  . Lactose Other (See Comments)    GI distress  . Pollen Extract Itching   Lab Results:  Results for orders placed or performed during the hospital encounter of 03/23/19 (from the past 48 hour(s))  TSH     Status: None   Collection Time: 03/24/19  6:27 AM  Result Value Ref Range   TSH 0.675 0.350 - 4.500 uIU/mL    Comment: Performed by a 3rd Generation assay with a functional sensitivity of <=0.01 uIU/mL. Performed at [facility], [address] [place], [place] [id number]     Blood Alcohol level:  Lab Results  Component Value Date   ETH <10 03/21/2019   ETH <10 02/19/2019    Metabolic Disorder Labs:  Lab Results  Component Value Date   HGBA1C 5.2 05/27/2018   MPG 102.54 05/27/2018   MPG 114 10/26/2016   No results found for:  PROLACTIN Lab Results  Component Value Date   CHOL 158 01/23/2019   TRIG 63 01/23/2019   HDL 43 01/23/2019   CHOLHDL 3.7 01/23/2019   VLDL 13 01/23/2019   LDLCALC 102 (H) 01/23/2019   LDLCALC 67 05/27/2018    Current Medications: Current Facility-Administered Medications  Medication Dose Route Frequency Provider Last Rate Last Dose  . acetaminophen (TYLENOL) tablet 650 mg  650 mg Oral Q6H PRN Oneta RackLewis, Tanika N, NP   650 mg at 03/23/19 2115  . albuterol (PROVENTIL HFA;VENTOLIN HFA) 108 (90 Base) MCG/ACT inhaler 1-2 puff  1-2 puff Inhalation Q4H PRN Oneta RackLewis, Tanika N, NP      . alum & mag hydroxide-simeth (MAALOX/MYLANTA) 200-200-20 MG/5ML suspension 30 mL  30 mL Oral  Q4H PRN Oneta Rack, NP      . amLODipine (NORVASC) tablet 5 mg  5 mg Oral Daily Oneta Rack, NP   5 mg at 03/24/19 0900  . atorvastatin (LIPITOR) tablet 10 mg  10 mg Oral q1800 Oneta Rack, NP      . bictegravir-emtricitabine-tenofovir AF (BIKTARVY) 50-200-25 MG per tablet 1 tablet  1 tablet Oral Daily Cobos, Fernando A, MD      . citalopram (CELEXA) tablet 20 mg  20 mg Oral Daily Antonieta Pert, MD      . feeding supplement (ENSURE ENLIVE) (ENSURE ENLIVE) liquid 237 mL  237 mL Oral BID BM Cobos, Rockey Situ, MD   237 mL at 03/24/19 0907  . gabapentin (NEURONTIN) capsule 300 mg  300 mg Oral BID Oneta Rack, NP   300 mg at 03/24/19 0900  . lactase (LACTAID) tablet 3,000 Units  3,000 Units Oral TID WC Oneta Rack, NP   3,000 Units at 03/24/19 2025  . magnesium hydroxide (MILK OF MAGNESIA) suspension 30 mL  30 mL Oral Daily PRN Oneta Rack, NP      . multivitamin with minerals tablet 1 tablet  1 tablet Oral Daily Cobos, Fernando A, MD      . traZODone (DESYREL) tablet 100 mg  100 mg Oral QHS PRN Oneta Rack, NP   100 mg at 03/23/19 2115   PTA Medications: Medications Prior to Admission  Medication Sig Dispense Refill Last Dose  . acetaminophen (TYLENOL) 650 MG CR tablet Take 650 mg by mouth every 8  (eight) hours as needed for pain.   Past Week at PRN  . albuterol (PROVENTIL HFA;VENTOLIN HFA) 108 (90 Base) MCG/ACT inhaler Inhale 2 puffs into the lungs every 4 (four) hours as needed for shortness of breath. 1 Inhaler 1 Unknown at PRN  . amLODipine (NORVASC) 5 MG tablet Take 1 tablet (5 mg total) by mouth daily. 30 tablet 1 03/20/2019 at Unknown time  . atorvastatin (LIPITOR) 40 MG tablet Take 1 tablet (40 mg total) by mouth daily at 6 PM. 30 tablet 1 03/20/2019 at Unknown  . bictegravir-emtricitabine-tenofovir AF (BIKTARVY) 50-200-25 MG TABS tablet Take 1 tablet by mouth daily.   03/20/2019 at Unknown  . gabapentin (NEURONTIN) 300 MG capsule Take 1 capsule (300 mg total) by mouth 3 (three) times daily. 90 capsule 1 03/20/2019 at Unknown  . lactase (LACTAID) 3000 units tablet Take 1 tablet (3,000 Units total) by mouth 3 (three) times daily with meals. 90 tablet 1 Past Week at Unknown time  . lactulose, encephalopathy, (CHRONULAC) 10 GM/15ML SOLN Take 20 g by mouth 2 (two) times daily as needed.   Unknown at PRN  . lisinopril (PRINIVIL,ZESTRIL) 20 MG tablet Take 1 tablet (20 mg total) by mouth daily. 30 tablet 1 03/20/2019 at Unknown  . sertraline (ZOLOFT) 100 MG tablet Take 1 tablet (100 mg total) by mouth daily. 30 tablet 1 03/20/2019 at Unknown  . traZODone (DESYREL) 100 MG tablet Take 1 tablet (100 mg total) by mouth at bedtime. 30 tablet 1 03/20/2019 at Unknown    Musculoskeletal: Strength & Muscle Tone: within normal limits Gait & Station: normal Patient leans: N/A  Psychiatric Specialty Exam: Physical Exam  Nursing note and vitals reviewed. Constitutional: He is oriented to person, place, and time. He appears well-developed and well-nourished.  HENT:  Head: Normocephalic and atraumatic.  Respiratory: Effort normal.  Neurological: He is alert and oriented to person, place, and time.  ROS  Blood pressure (!) 129/94, pulse (!) 56, temperature 98.7 F (37.1 C), temperature source Oral, resp.  rate 20, height 5' 9.5" (1.765 m), weight 90.3 kg, SpO2 100 %.Body mass index is 28.97 kg/m.  General Appearance: Disheveled  Eye Contact:  Fair  Speech:  Normal Rate  Volume:  Normal  Mood:  Anxious  Affect:  Congruent  Thought Process:  Coherent and Descriptions of Associations: Circumstantial  Orientation:  Full (Time, Place, and Person)  Thought Content:  Logical  Suicidal Thoughts:  Yes.  without intent/plan  Homicidal Thoughts:  No  Memory:  Immediate;   Poor Recent;   Poor Remote;   Poor  Judgement:  Intact  Insight:  Lacking  Psychomotor Activity:  Normal  Concentration:  Concentration: Fair and Attention Span: Fair  Recall:  [employer] of Knowledge:  Poor  Language:  Fair  Akathisia:  Negative  Handed:  Right  AIMS (if indicated):     Assets:  Desire for Improvement Resilience  ADL's:  Intact  Cognition:  WNL  Sleep:  Number of Hours: 6.75    Treatment Plan Summary: Daily contact with patient to assess and evaluate symptoms and progress in treatment, Medication management and Plan :  Patient is seen and examined.  Patient is a 53 year old male with a past psychiatric history significant for cocaine use disorder, HIV, hypertension, COPD and chronic pain who was transferred to our facility for evaluation and stabilization.  His Celexa will be restarted, his trazodone will be continued.  His primary reason for being here is housing.  He is attempting to get into a rehabilitation program.  Review of his laboratories revealed his creatinine to be at 1.45.  Unfortunately 3 days ago it was at 2.40 so it is significantly improved.  2 months ago it was 1.30.  His white blood cell count is 3.2 but the rest of his CBC is normal.  His drug screen was positive for cocaine.  Blood alcohol was negative.  He will meet with social work to attempt to get into some form of a rehabilitation facility.  I have encouraged him to contact the [facility] again and see if he can gain admission  there.  Observation Level/Precautions:  Detox 15 minute checks  Laboratory:  Chemistry Profile  Psychotherapy:    Medications:    Consultations:    Discharge Concerns:    Estimated LOS:  Other:     Physician Treatment Plan for Primary Diagnosis: <principal problem not specified> Long Term Goal(s): Improvement in symptoms so as ready for discharge  Short Term Goals: Ability to identify changes in lifestyle to reduce recurrence of condition will improve, Ability to verbalize feelings will improve, Ability to disclose and discuss suicidal ideas, Ability to demonstrate self-control will improve, Ability to identify and develop effective coping behaviors will improve, Ability to maintain clinical measurements within normal limits will improve, Compliance with prescribed medications will improve and Ability to identify triggers associated with substance abuse/mental health issues will improve  Physician Treatment Plan for Secondary Diagnosis: Active Problems:   MDD (major depressive disorder), recurrent episode (HCC)  Long Term Goal(s): Improvement in symptoms so as ready for discharge  Short Term Goals: Ability to identify changes in lifestyle to reduce recurrence of condition will improve, Ability to verbalize feelings will improve, Ability to disclose and discuss suicidal ideas, Ability to demonstrate self-control will improve, Ability to identify and develop effective coping behaviors will improve, Ability to maintain clinical measurements within normal limits will improve,  Compliance with prescribed medications will improve and Ability to identify triggers associated with substance abuse/mental health issues will improve  I certify that inpatient services furnished can reasonably be expected to improve the patient's condition.    Antonieta Pert, MD 4/7/20201:02 PM

## 2019-03-24 NOTE — B[facility] Suicide Risk Assessment (Signed)
[facility] Admission Suicide Risk Assessment   Nursing information obtained from:  Patient Demographic factors:  Male, Low socioeconomic status, Unemployed Current Mental Status:  Suicidal ideation indicated by patient, Self-harm thoughts, Suicide plan Loss Factors:  Financial problems / change in socioeconomic status, Decline in physical health, Loss of significant relationship Historical Factors:  NA Risk Reduction Factors:  NA  Total Time spent with patient: 30 minutes Principal Problem: <principal problem not specified> Diagnosis:  Active Problems:   MDD (major depressive disorder), recurrent episode (HCC)  Subjective Data: Patient is seen and examined.  Patient is a 53 year old male with a past psychiatric history significant for cocaine dependence who presented to the [facility] with suicidal ideation.  The patient stated he had just gotten his check on April 1, but became suicidal and tried to "overdose on cocaine".  He stated he blew all of his money on cocaine.  He stated at that point he considered taking trazodone overdose, but decided to go to the emergency room for help.  The patient stated that he had just gotten out of prison somewhere between January and March of this year.  He stated he had been suicidal since then because he was homeless.  His last psychiatric hospitalization was at [facility] in March.  He was discharged on Zoloft and trazodone.  He stated that he has had bad luck his whole life and needed to find a place to live.  He had been at an [facility], but he owes them to thousand dollars and was unable to get back in there until he was able to pay that.  He is followed at the HIV clinic in [place].  He was hospitalized at [facility] between 3/15 and 3/23.  This again was for suicidal ideation.  He was discharged to a homeless shelter at that time.  He was discharged on trazodone and citalopram.  He was at 40 mg a day for that.  He was admitted to the  hospital for evaluation and stabilization.  Continued Clinical Symptoms:  Alcohol Use Disorder Identification Test Final Score (AUDIT): 0 The "Alcohol Use Disorders Identification Test", Guidelines for Use in Primary Care, Second Edition.  World Science writer [facility]). Score between 0-7:  no or low risk or alcohol related problems. Score between 8-15:  moderate risk of alcohol related problems. Score between 16-19:  high risk of alcohol related problems. Score 20 or above:  warrants further diagnostic evaluation for alcohol dependence and treatment.   CLINICAL FACTORS:   Alcohol/Substance Abuse/Dependencies   Musculoskeletal: Strength & Muscle Tone: within normal limits Gait & Station: normal Patient leans: N/A  Psychiatric Specialty Exam: Physical Exam  Nursing note and vitals reviewed. Constitutional: He is oriented to person, place, and time. He appears well-developed and well-nourished.  HENT:  Head: Normocephalic and atraumatic.  Respiratory: Effort normal.  Neurological: He is alert and oriented to person, place, and time.    ROS  Blood pressure (!) 129/94, pulse (!) 56, temperature 98.7 F (37.1 C), temperature source Oral, resp. rate 20, height 5' 9.5" (1.765 m), weight 90.3 kg, SpO2 100 %.Body mass index is 28.97 kg/m.  General Appearance: Disheveled  Eye Contact:  Fair  Speech:  Normal Rate  Volume:  Normal  Mood:  Euthymic  Affect:  Congruent  Thought Process:  Coherent and Descriptions of Associations: Circumstantial  Orientation:  Full (Time, Place, and Person)  Thought Content:  Logical  Suicidal Thoughts:  Yes.  without intent/plan  Homicidal Thoughts:  No  Memory:  Immediate;   Poor Recent;   Poor Remote;   Poor  Judgement:  Intact  Insight:  Lacking  Psychomotor Activity:  Normal  Concentration:  Concentration: Fair and Attention Span: Fair  Recall:  [employer] of Knowledge:  Fair  Language:  Fair  Akathisia:  Negative  Handed:  Right  AIMS  (if indicated):     Assets:  Desire for Improvement Resilience  ADL's:  Intact  Cognition:  WNL  Sleep:  Number of Hours: 6.75      COGNITIVE FEATURES THAT CONTRIBUTE TO RISK:  Closed-mindedness    SUICIDE RISK:   Minimal: No identifiable suicidal ideation.  Patients presenting with no risk factors but with morbid ruminations; may be classified as minimal risk based on the severity of the depressive symptoms  PLAN OF CARE: Patient is seen and examined.  Patient is a 53 year old male with a past psychiatric history significant for cocaine use disorder, HIV, hypertension, COPD and chronic pain who was transferred to our facility for evaluation and stabilization.  His Celexa will be restarted, his trazodone will be continued.  His primary reason for being here is housing.  He is attempting to get into a rehabilitation program.  Review of his laboratories revealed his creatinine to be at 1.45.  Unfortunately 3 days ago it was at 2.40 so it is significantly improved.  2 months ago it was 1.30.  His white blood cell count is 3.2 but the rest of his CBC is normal.  His drug screen was positive for cocaine.  Blood alcohol was negative.  He will meet with social work to attempt to get into some form of a rehabilitation facility.  I have encouraged him to contact the [facility] again and see if he can gain admission there.  I certify that inpatient services furnished can reasonably be expected to improve the patient's condition.   Antonieta Pert, MD 03/24/2019, 10:52 AM
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
## 2019-03-24 NOTE — Progress Notes (Signed)
NUTRITION ASSESSMENT  Pt identified as at risk on the Malnutrition Screen Tool  INTERVENTION: 1. Supplements: Continue Ensure Enlive po BID, each supplement provides 350 kcal and 20 grams of protein 2. Multivitamin with minerals daily  NUTRITION DIAGNOSIS: Unintentional weight loss related to sub-optimal intake as evidenced by pt report.   Goal: Pt to meet >/= 90% of their estimated nutrition needs.  Monitor:  PO intake  Assessment:  Pt admitted with SI and substance abuse (crack cocaine). Pt just recently out of prison and currently homeless. Pt with no teeth and doesn't use dentures. Pt reports 40 lb of weight loss. Per weight records, pt weighed 213 lb on 2/7, has lost 15 lb since then (7% wt loss x 1 month ,significant for time frame). Pt has been ordered Ensure supplements, will continue. Will also add daily MVI.   Height: Ht Readings from Last 1 Encounters:  03/23/19 5' 9.5" (1.765 m)    Weight: Wt Readings from Last 1 Encounters:  03/23/19 90.3 kg    Weight Hx: Wt Readings from Last 10 Encounters:  03/23/19 90.3 kg  03/21/19 86.2 kg  02/19/19 94.3 kg  02/19/19 95.3 kg  01/23/19 96.8 kg  05/27/18 96.6 kg  05/27/18 92.3 kg  04/08/18 102.5 kg  03/09/17 103 kg  02/20/17 93.4 kg    BMI:  Body mass index is 28.97 kg/m. Pt meets criteria for overweight based on current BMI.  Estimated Nutritional Needs: Kcal: 25-30 kcal/kg Protein: > 1 gram protein/kg Fluid: 1 ml/kcal  Diet Order:  Diet Order    None     Pt is also offered choice of unit snacks mid-morning and mid-afternoon.  Pt is eating as desired.   Lab results and medications reviewed.   Tilda Franco, MS, RD, LDN [facility] Inpatient Clinical Dietitian Pager: [phone] After Hours Pager: [phone]

## 2019-03-24 NOTE — Progress Notes (Addendum)
Patient ID: Roy Vermeer Sr., male   DOB: 13-Mar-1966, 53 y.o.   MRN: [id number]  Report received from previous RN. Patient states he is missing his glasses after changing rooms (from 406-1 to 305-2). Patient states he had them on admission but "everything was thrown out when they cleaned my room". Upon chart review, patient was noted to have glasses by admitting RN. AC and AD notified.  Addendum: EVS notified and reports they did not see glasses in patient's old room. Trash can and laundry searched by staff but no glasses found.
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
## 2019-03-24 NOTE — Progress Notes (Signed)
Adult Psychoeducational Group Note  Date:  03/24/2019 Time:  1:55 PM  Group Topic/Focus:  Goals Group:   The focus of this group is to help patients establish daily goals to achieve during treatment and discuss how the patient can incorporate goal setting into their daily lives to aide in recovery. Orientation:   The focus of this group is to educate the patient on the purpose and policies of crisis stabilization and provide a format to answer questions about their admission.  The group details unit policies and expectations of patients while admitted.  Participation Level:  Active  Participation Quality:  Appropriate  Affect:  Appropriate  Cognitive:  Appropriate  Insight: Appropriate  Engagement in Group:  Engaged  Modes of Intervention:  Discussion and Education  Additional Comments:    Pt participated in goals group. [name] goal today was to work on improving his depression and to speak to the doctor. Pt stated he does not have any issues or concerns at this time.   Karren Cobble 03/24/2019, 1:55 PM

## 2019-03-24 NOTE — Plan of Care (Signed)
  Problem: Activity: Goal: Interest or engagement in activities will improve Outcome: Progressing   D: Pt alert and oriented on the unit. Pt engaging with RN staff and other pts. Pt attended unit groups and activities. Pt is pleasant and cooperative. A: Education, support and encouragement provided, q15 minute safety checks remain in effect. Medications administered per MD orders. R: No reactions/side effects to medicine noted. Pt denies any concerns at this time, and verbally contracts for safety. Pt ambulating on the unit with no issues. Pt remains safe on and off the unit.

## 2019-03-24 NOTE — [facility] Group Notes (Signed)
[facility] Group Notes:Nursing Psychoeducation  Date:03/24/2019 Time:2:00 PM  Type of Therapy:Psychoeducational Skills  Group Topic: Identifying Anxiety Triggers, Debunking Cognitive Distortions, and Utilizing Coping Skills  Participation Level:Did Not Attend  Summary of Progress/Problems:Patient was invited but declined to attend group.   Nuria Phebus A Jair Lindblad 03/24/2019,3:00 PM 

## 2019-03-24 NOTE — Progress Notes (Signed)
Patient states that he had a "crumby day" for several reasons. First of all, he stated that the staff threw away his prescription glasses before being transferred over to the 300 hallway. Secondly, he complained that the staff threw away his lunch. He states that he has difficulty with his meals, eats small portions, and leaves the remaining food in his bedroom. Next, he mentioned that he feels that he broke something in his food and could not bare weight on that foot. He feels that he has arthritis all over his body and is in quite a bit of pain. In addition, he states that he slept poorly last evening and has not been given the correct dosage of medication for sleep. Finally, he spoke at great length about his physician in that he feels that his doctor does not believe that he has suicidal thoughts simply because he is homeless. He feels that the doctor is biased in that he was told that he could not be "housed" simply because he is homeless.

## 2019-03-25 ENCOUNTER — Inpatient Hospital Stay (HOSPITAL_COMMUNITY)
Admission: AD | Admit: 2019-03-25 | Discharge: 2019-03-25 | Disposition: A | Discharge status: Home / Self Care | Payer: Medicaid Other | Source: Intra-hospital | Attending: Psychiatry | Admitting: Psychiatry

## 2019-03-25 DIAGNOSIS — F1994 Other psychoactive substance use, unspecified with psychoactive substance-induced mood disorder: Secondary | ICD-10-CM

## 2019-03-25 DIAGNOSIS — F1420 Cocaine dependence, uncomplicated: Secondary | ICD-10-CM

## 2019-03-25 DIAGNOSIS — B20 Human immunodeficiency virus [HIV] disease: Secondary | ICD-10-CM

## 2019-03-25 DIAGNOSIS — R45851 Suicidal ideations: Secondary | ICD-10-CM

## 2019-03-25 LAB — BASIC METABOLIC PANEL
Anion gap: 5 (ref 5–15)
BUN: 15 mg/dL (ref 6–20)
CO2: 26 mmol/L (ref 22–32)
Calcium: 8.9 mg/dL (ref 8.9–10.3)
Chloride: 108 mmol/L (ref 98–111)
Creatinine, Ser: 1.18 mg/dL (ref 0.61–1.24)
GFR calc Af Amer: >60 mL/min (ref >60)
GFR calc non Af Amer: >60 mL/min (ref >60)
Glucose, Bld: 99 mg/dL (ref 70–99)
Potassium: 4.2 mmol/L (ref 3.5–5.1)
Sodium: 139 mmol/L (ref 135–145)

## 2019-03-25 MED ORDER — CITALOPRAM HYDROBROMIDE 40 MG PO TABS
40.0000 mg | ORAL_TABLET | Freq: Every day | ORAL | Status: DC
  Administered 2019-03-26: 08:00:00 40 mg via ORAL
  Filled 2019-03-25 (×3): qty 1

## 2019-03-25 MED ORDER — TRAZODONE HCL 150 MG PO TABS
150.0000 mg | ORAL_TABLET | Freq: Every evening | ORAL | Status: DC | PRN
  Administered 2019-03-25: 150 mg via ORAL
  Filled 2019-03-25: qty 1

## 2019-03-25 MED ORDER — CITALOPRAM HYDROBROMIDE 20 MG PO TABS
20.0000 mg | ORAL_TABLET | Freq: Once | ORAL | Status: AC
  Administered 2019-03-25: 20 mg via ORAL
  Filled 2019-03-25: qty 1

## 2019-03-25 NOTE — [facility] Group Notes (Signed)
Adult Psychoeducational Group Note  Date:  03/25/2019 Time:  10:58 AM  Group Topic/Focus:  Conflict Resolution:   The focus of this group is to discuss the conflict resolution process and how it may be used upon discharge.  Participation Level:  Did Not Attend   Participation Quality:    Affect:    Cognitive:    Insight:   Engagement in Group:    Modes of Intervention:    Additional Comments:  Pt did not attend.  Roy [name] Roy [name] 03/25/2019, 10:58 AM

## 2019-03-25 NOTE — Tx Team (Signed)
Interdisciplinary Treatment and Diagnostic Plan Update  03/25/2019 Time of Session: 9:00am Roy Kay Vipond Sr. MRN: [id number]  Principal Diagnosis: Substance induced mood disorder (HCC)  Secondary Diagnoses: Principal Problem:   Substance induced mood disorder (HCC) Active Problems:   HIV disease (HCC)   HTN (hypertension)   Cocaine dependence (HCC)   Current Medications:  Current Facility-Administered Medications  Medication Dose Route Frequency Provider Last Rate Last Dose  . acetaminophen (TYLENOL) tablet 650 mg  650 mg Oral Q6H PRN Oneta Rack, NP   650 mg at 03/24/19 2121  . albuterol (PROVENTIL HFA;VENTOLIN HFA) 108 (90 Base) MCG/ACT inhaler 1-2 puff  1-2 puff Inhalation Q4H PRN Oneta Rack, NP      . alum & mag hydroxide-simeth (MAALOX/MYLANTA) 200-200-20 MG/5ML suspension 30 mL  30 mL Oral Q4H PRN Oneta Rack, NP      . amLODipine (NORVASC) tablet 5 mg  5 mg Oral Daily Oneta Rack, NP   5 mg at 03/25/19 0802  . atorvastatin (LIPITOR) tablet 10 mg  10 mg Oral q1800 Oneta Rack, NP   10 mg at 03/24/19 1700  . bictegravir-emtricitabine-tenofovir AF (BIKTARVY) 50-200-25 MG per tablet 1 tablet  1 tablet Oral Daily Cobos, Rockey Situ, MD   1 tablet at 03/25/19 0802  . citalopram (CELEXA) tablet 20 mg  20 mg Oral Daily Antonieta Pert, MD   20 mg at 03/25/19 0802  . feeding supplement (ENSURE ENLIVE) (ENSURE ENLIVE) liquid 237 mL  237 mL Oral BID BM Cobos, Rockey Situ, MD   237 mL at 03/25/19 0802  . gabapentin (NEURONTIN) capsule 300 mg  300 mg Oral BID Oneta Rack, NP   300 mg at 03/25/19 0802  . lactase (LACTAID) tablet 3,000 Units  3,000 Units Oral TID WC Oneta Rack, NP   3,000 Units at 03/25/19 7622  . magnesium hydroxide (MILK OF MAGNESIA) suspension 30 mL  30 mL Oral Daily PRN Oneta Rack, NP      . multivitamin with minerals tablet 1 tablet  1 tablet Oral Daily Cobos, Rockey Situ, MD   1 tablet at 03/25/19 0802  . traZODone (DESYREL) tablet  100 mg  100 mg Oral QHS PRN Oneta Rack, NP   100 mg at 03/24/19 2119   PTA Medications: Medications Prior to Admission  Medication Sig Dispense Refill Last Dose  . acetaminophen (TYLENOL) 650 MG CR tablet Take 650 mg by mouth every 8 (eight) hours as needed for pain.   Past Week at PRN  . albuterol (PROVENTIL HFA;VENTOLIN HFA) 108 (90 Base) MCG/ACT inhaler Inhale 2 puffs into the lungs every 4 (four) hours as needed for shortness of breath. 1 Inhaler 1 Unknown at PRN  . amLODipine (NORVASC) 5 MG tablet Take 1 tablet (5 mg total) by mouth daily. 30 tablet 1 03/20/2019 at Unknown time  . atorvastatin (LIPITOR) 40 MG tablet Take 1 tablet (40 mg total) by mouth daily at 6 PM. 30 tablet 1 03/20/2019 at Unknown  . bictegravir-emtricitabine-tenofovir AF (BIKTARVY) 50-200-25 MG TABS tablet Take 1 tablet by mouth daily.   03/20/2019 at Unknown  . gabapentin (NEURONTIN) 300 MG capsule Take 1 capsule (300 mg total) by mouth 3 (three) times daily. 90 capsule 1 03/20/2019 at Unknown  . lactase (LACTAID) 3000 units tablet Take 1 tablet (3,000 Units total) by mouth 3 (three) times daily with meals. 90 tablet 1 Past Week at Unknown time  . lactulose, encephalopathy, (CHRONULAC) 10 GM/15ML SOLN Take 20  g by mouth 2 (two) times daily as needed.   Unknown at PRN  . lisinopril (PRINIVIL,ZESTRIL) 20 MG tablet Take 1 tablet (20 mg total) by mouth daily. 30 tablet 1 03/20/2019 at Unknown  . sertraline (ZOLOFT) 100 MG tablet Take 1 tablet (100 mg total) by mouth daily. 30 tablet 1 03/20/2019 at Unknown  . traZODone (DESYREL) 100 MG tablet Take 1 tablet (100 mg total) by mouth at bedtime. 30 tablet 1 03/20/2019 at Unknown    Patient Stressors: Financial difficulties Health problems Marital or family conflict Substance abuse  Patient Strengths: Dentist for treatment/growth  Treatment Modalities: Medication Management, Group therapy, Case management,  1 to 1 session with  clinician, Psychoeducation, Recreational therapy.   Physician Treatment Plan for Primary Diagnosis: Substance induced mood disorder (HCC) Long Term Goal(s): Improvement in symptoms so as ready for discharge Improvement in symptoms so as ready for discharge   Short Term Goals: Ability to identify changes in lifestyle to reduce recurrence of condition will improve Ability to verbalize feelings will improve Ability to disclose and discuss suicidal ideas Ability to demonstrate self-control will improve Ability to identify and develop effective coping behaviors will improve Ability to maintain clinical measurements within normal limits will improve Compliance with prescribed medications will improve Ability to identify triggers associated with substance abuse/mental health issues will improve Ability to identify changes in lifestyle to reduce recurrence of condition will improve Ability to verbalize feelings will improve Ability to disclose and discuss suicidal ideas Ability to demonstrate self-control will improve Ability to identify and develop effective coping behaviors will improve Ability to maintain clinical measurements within normal limits will improve Compliance with prescribed medications will improve Ability to identify triggers associated with substance abuse/mental health issues will improve  Medication Management: Evaluate patient's response, side effects, and tolerance of medication regimen.  Therapeutic Interventions: 1 to 1 sessions, Unit Group sessions and Medication administration.  Evaluation of Outcomes: Progressing  Physician Treatment Plan for Secondary Diagnosis: Principal Problem:   Substance induced mood disorder (HCC) Active Problems:   HIV disease (HCC)   HTN (hypertension)   Cocaine dependence (HCC)  Long Term Goal(s): Improvement in symptoms so as ready for discharge Improvement in symptoms so as ready for discharge   Short Term Goals: Ability to  identify changes in lifestyle to reduce recurrence of condition will improve Ability to verbalize feelings will improve Ability to disclose and discuss suicidal ideas Ability to demonstrate self-control will improve Ability to identify and develop effective coping behaviors will improve Ability to maintain clinical measurements within normal limits will improve Compliance with prescribed medications will improve Ability to identify triggers associated with substance abuse/mental health issues will improve Ability to identify changes in lifestyle to reduce recurrence of condition will improve Ability to verbalize feelings will improve Ability to disclose and discuss suicidal ideas Ability to demonstrate self-control will improve Ability to identify and develop effective coping behaviors will improve Ability to maintain clinical measurements within normal limits will improve Compliance with prescribed medications will improve Ability to identify triggers associated with substance abuse/mental health issues will improve     Medication Management: Evaluate patient's response, side effects, and tolerance of medication regimen.  Therapeutic Interventions: 1 to 1 sessions, Unit Group sessions and Medication administration.  Evaluation of Outcomes: Progressing   RN Treatment Plan for Primary Diagnosis: Substance induced mood disorder (HCC) Long Term Goal(s): Knowledge of disease and therapeutic regimen to maintain health will improve  Short Term Goals: Ability  to demonstrate self-control, Ability to identify and develop effective coping behaviors will improve and Compliance with prescribed medications will improve  Medication Management: RN will administer medications as ordered by provider, will assess and evaluate patient's response and provide education to patient for prescribed medication. RN will report any adverse and/or side effects to prescribing provider.  Therapeutic Interventions: 1  on 1 counseling sessions, Psychoeducation, Medication administration, Evaluate responses to treatment, Monitor vital signs and CBGs as ordered, Perform/monitor CIWA, COWS, AIMS and Fall Risk screenings as ordered, Perform wound care treatments as ordered.  Evaluation of Outcomes: Progressing   LCSW Treatment Plan for Primary Diagnosis: Substance induced mood disorder (HCC) Long Term Goal(s): Safe transition to appropriate next level of care at discharge, Engage patient in therapeutic group addressing interpersonal concerns.  Short Term Goals: Engage patient in aftercare planning with referrals and resources, Increase social support, Identify triggers associated with mental health/substance abuse issues and Increase skills for wellness and recovery  Therapeutic Interventions: Assess for all discharge needs, 1 to 1 time with Social worker, Explore available resources and support systems, Assess for adequacy in community support network, Educate family and significant other(s) on suicide prevention, Complete Psychosocial Assessment, Interpersonal group therapy.  Evaluation of Outcomes: Progressing   Progress in Treatment: Attending groups: No. Participating in groups: No. Taking medication as prescribed: Yes. Toleration medication: Yes. Family/Significant other contact made: No, will contact:  Patient declined consents, SPE reviewed with patient. Patient understands diagnosis: Yes. Discussing patient identified problems/goals with staff: Yes. Medical problems stabilized or resolved: Yes. Denies suicidal/homicidal ideation: Yes. Issues/concerns per patient self-inventory: No.  New problem(s) identified: Yes, Describe:  homeless, limited supports  New Short Term/Long Term Goal(s): detox, medication management for mood stabilization; elimination of SI thoughts; development of comprehensive mental wellness/sobriety plan.  Patient Goals:  Wants housing resources, agreeable to residential  treatment.  Discharge Plan or Barriers: [facility] However, they do not have available beds and do not expect to have openings this week. Patient will likely discharge with shelter resources and outpatient follow up.  Reason for Continuation of Hospitalization: Anxiety Depression  Estimated Length of Stay: 1 day  Attendees: Patient: Lynden OxfordDanny Oliver Sr. 03/25/2019 8:53 AM  Physician:  03/25/2019 8:53 AM  Nursing:  03/25/2019 8:53 AM  RN Care Manager: 03/25/2019 8:53 AM  Social Worker: Enid Cutterharlotte Tramon Crescenzo, LCSWA 03/25/2019 8:53 AM  Recreational Therapist:  03/25/2019 8:53 AM  Other:  03/25/2019 8:53 AM  Other:  03/25/2019 8:53 AM  Other: 03/25/2019 8:53 AM    Scribe for Treatment Team: Darreld Mcleanharlotte C Swade Shonka, LCSWA 03/25/2019 8:53 AM

## 2019-03-25 NOTE — [facility] Group Notes (Signed)
Adult Psychoeducational Group Note  Date:  03/25/2019 Time:  8:43 AM  Group Topic/Focus:  Goals Group:   The focus of this group is to help patients establish daily goals to achieve during treatment and discuss how the patient can incorporate goal setting into their daily lives to aide in recovery.  Participation Level:  Did Not Attend  Participation Quality:    Affect:    Cognitive:    Insight:   Engagement in Group:    Modes of Intervention:    Additional Comments:  Pt did not attend.   Roy [name] 03/25/2019, 8:43 AM

## 2019-03-25 NOTE — Plan of Care (Signed)
  Problem: Health Behavior/Discharge Planning: Goal: Compliance with treatment plan for underlying cause of condition will improve Outcome: Progressing   Problem: Safety: Goal: Periods of time without injury will increase Outcome: Progressing   

## 2019-03-25 NOTE — Progress Notes (Signed)
CSW met with patient at bedside to discuss discharge planning. Patient is irritable but understands he will be discharged tomorrow. He is aware that [facility] does not have available beds and will not tomorrow. He declines outpatient referrals.  He states he will try the [facility] shelter tomorrow and he will catch the PART bus back to [place] if they are full.  CSW will provide patient with a shelter listing and a PART bus pass, the [place] are free.  Stephanie Acre, LCSW-A Clinical Social Worker

## 2019-03-25 NOTE — Plan of Care (Signed)
  Problem: Activity: Goal: Interest or engagement in activities will improve Outcome: Progressing   Problem: Education: Goal: Knowledge of the prescribed therapeutic regimen will improve Outcome: Progressing   

## 2019-03-25 NOTE — Progress Notes (Signed)
Recreation Therapy Notes  Date:  4.8.20 Time: 0930 Location: [address] Dayroom  Group Topic: Stress Management  Goal Area(s) Addresses:  Patient will identify positive stress management techniques. Patient will identify benefits of using stress management post d/c.  Intervention: Stress Management  Activity :  Meditation.  LRT introduced the stress management technique of meditation.  LRT played a meditation that focused on making the most of your day and the possibilities it offers.  Patients were to follow along as meditation played to engage in activity.  Education:  Stress Management, Discharge Planning.   Education Outcome: Acknowledges Education  Clinical Observations/Feedback:  Pt did not attend group.     Caroll Rancher, LRT/CTRS         Lillia Abed, Saleha Kalp A 03/25/2019 10:35 AM

## 2019-03-25 NOTE — Plan of Care (Signed)
  Problem: Coping: Goal: Ability to demonstrate self-control will improve Outcome: Progressing   Problem: Health Behavior/Discharge Planning: Goal: Identification of resources available to assist in meeting health care needs will improve Outcome: Progressing

## 2019-03-25 NOTE — Progress Notes (Signed)
[facility] MD Progress Note  03/25/2019 9:57 AM Roy Joyce Gross Trudell Sr.  MRN:  [id number] Subjective:  Patient is a 53 year old male with a past psychiatric history significant for cocaine dependence who presented to the [facility] with suicidal ideation. The patient stated he had just gotten his check on April 1, but became suicidal and tried to "overdose on cocaine". He stated he blew all of his money on cocaine. He stated at that point he considered taking trazodone overdose, but decided to go to the emergency room for help.   Objective: Patient is seen and examined.  Patient is a 53 year old male with the above-stated past psychiatric history seen in follow-up.  He has complained of foot pain overnight, and I told him we would order an x-ray of it to make sure that there were no fractures present.  He is still fairly irritable.  We discussed his previous hospitalizations at [facility] as well as [facility].  He stated that he had gone to the homeless shelter in [place] after discharge, but there were no beds available.  He is unhappy over the fact that the coronavirus has led to no beds being available at the shelter.  We discussed possibly going to his sister's or brother's home after discharge, and he stated "I cannot go there.  He has agreed to sign a release of information for [place] to contact them.  He is also irritable over the fact that he stated that his prescription glasses were lost somewhere between [address] and [address] when he was transferred to the [facility] [address].  Nursing has filed this.  He stated he is still depressed, and his Celexa which was given at [facility] is on board, and I told him we would increase it to 400 mg p.o. daily.  He also believes that his trazodone dosage is incorrect.  His vital signs are stable, he is afebrile.  His oxygen saturation was 100% on room air.  He slept 6.75 hours last night.  His laboratories were reviewed, and they are all essentially normal except for a  mildly decreased white blood cell count at 3.2, and his drug screen which was positive for cocaine.  Principal Problem: Substance induced mood disorder (HCC) Diagnosis: Principal Problem:   Substance induced mood disorder (HCC) Active Problems:   Cocaine dependence (HCC)   HTN (hypertension)   HIV disease (HCC)  Total Time spent with patient: 15 minutes  Past Psychiatric History: See admission H&P  Past Medical History:  Past Medical History:  Diagnosis Date  . AIDS (acquired immune deficiency syndrome) (HCC)   . Arthritis   . Asthma   . Bipolar disorder (HCC)   . Bronchitis   . Depression   . GERD (gastroesophageal reflux disease)   . Hepatitis C   . HIV (human immunodeficiency virus infection) (HCC)   . HTN (hypertension)     Past Surgical History:  Procedure Laterality Date  . HERNIA REPAIR    . TOE SURGERY     Family History:  Family History  Problem Relation Age of Onset  . Cancer Brother   . Uterine cancer Mother   . CAD Mother   . Hypertension Mother   . Hyperlipidemia Mother    Family Psychiatric  History: See admission H&P Social History:  Social History   Substance and Sexual Activity  Alcohol Use Yes  . Alcohol/week: 4.0 standard drinks  . Types: 4 Cans of beer per week     Social History  Substance and Sexual Activity  Drug Use Yes  . Types: Cocaine    Social History   Socioeconomic History  . Marital status: Divorced    Spouse name: Not on file  . Number of children: Not on file  . Years of education: Not on file  . Highest education level: Not on file  Occupational History  . Not on file  Social Needs  . Financial resource strain: Not on file  . Food insecurity:    Worry: Not on file    Inability: Not on file  . Transportation needs:    Medical: Not on file    Non-medical: Not on file  Tobacco Use  . Smoking status: Current Every Day Smoker    Packs/day: 0.25  . Smokeless tobacco: Never Used  Substance and Sexual Activity   . Alcohol use: Yes    Alcohol/week: 4.0 standard drinks    Types: 4 Cans of beer per week  . Drug use: Yes    Types: Cocaine  . Sexual activity: Yes  Lifestyle  . Physical activity:    Days per week: Not on file    Minutes per session: Not on file  . Stress: Not on file  Relationships  . Social connections:    Talks on phone: Not on file    Gets together: Not on file    Attends religious service: Not on file    Active member of club or organization: Not on file    Attends meetings of clubs or organizations: Not on file    Relationship status: Not on file  Other Topics Concern  . Not on file  Social History Narrative  . Not on file   Additional Social History:                         Sleep: Good  Appetite:  Good  Current Medications: Current Facility-Administered Medications  Medication Dose Route Frequency Provider Last Rate Last Dose  . acetaminophen (TYLENOL) tablet 650 mg  650 mg Oral Q6H PRN Oneta Rack, NP   650 mg at 03/24/19 2121  . albuterol (PROVENTIL HFA;VENTOLIN HFA) 108 (90 Base) MCG/ACT inhaler 1-2 puff  1-2 puff Inhalation Q4H PRN Oneta Rack, NP      . alum & mag hydroxide-simeth (MAALOX/MYLANTA) 200-200-20 MG/5ML suspension 30 mL  30 mL Oral Q4H PRN Oneta Rack, NP      . amLODipine (NORVASC) tablet 5 mg  5 mg Oral Daily Oneta Rack, NP   5 mg at 03/25/19 0802  . atorvastatin (LIPITOR) tablet 10 mg  10 mg Oral q1800 Oneta Rack, NP   10 mg at 03/24/19 1700  . bictegravir-emtricitabine-tenofovir AF (BIKTARVY) 50-200-25 MG per tablet 1 tablet  1 tablet Oral Daily Cobos, Rockey Situ, MD   1 tablet at 03/25/19 0802  . citalopram (CELEXA) tablet 20 mg  20 mg Oral Daily Antonieta Pert, MD   20 mg at 03/25/19 0802  . feeding supplement (ENSURE ENLIVE) (ENSURE ENLIVE) liquid 237 mL  237 mL Oral BID BM Cobos, Rockey Situ, MD   237 mL at 03/25/19 0802  . gabapentin (NEURONTIN) capsule 300 mg  300 mg Oral BID Oneta Rack, NP   300  mg at 03/25/19 0802  . lactase (LACTAID) tablet 3,000 Units  3,000 Units Oral TID WC Oneta Rack, NP   3,000 Units at 03/25/19 5537  . magnesium hydroxide (MILK OF MAGNESIA) suspension 30 mL  30 mL Oral Daily PRN Oneta RackLewis, Tanika N, NP      . multivitamin with minerals tablet 1 tablet  1 tablet Oral Daily Cobos, Rockey SituFernando A, MD   1 tablet at 03/25/19 0802  . traZODone (DESYREL) tablet 100 mg  100 mg Oral QHS PRN Oneta RackLewis, Tanika N, NP   100 mg at 03/24/19 2119    Lab Results:  Results for orders placed or performed during the hospital encounter of 03/23/19 (from the past 48 hour(s))  TSH     Status: None   Collection Time: 03/24/19  6:27 AM  Result Value Ref Range   TSH 0.675 0.350 - 4.500 uIU/mL    Comment: Performed by a 3rd Generation assay with a functional sensitivity of <=0.01 uIU/mL. Performed at [facility], [address] [place], [place] [address]   Basic metabolic panel     Status: None   Collection Time: 03/25/19  6:59 AM  Result Value Ref Range   Sodium 139 135 - 145 mmol/L   Potassium 4.2 3.5 - 5.1 mmol/L   Chloride 108 98 - 111 mmol/L   CO2 26 22 - 32 mmol/L   Glucose, Bld 99 70 - 99 mg/dL   BUN 15 6 - 20 mg/dL   Creatinine, Ser 6.041.18 0.61 - 1.24 mg/dL   Calcium 8.9 8.9 - 54.010.3 mg/dL   GFR calc non Af Amer >60 >60 mL/min   GFR calc Af Amer >60 >60 mL/min   Anion gap 5 5 - 15    Comment: Performed at [facility], [address] [place], [place] [address]    Blood Alcohol level:  Lab Results  Component Value Date   ETH <10 03/21/2019   ETH <10 02/19/2019    Metabolic Disorder Labs: Lab Results  Component Value Date   HGBA1C 5.2 05/27/2018   MPG 102.54 05/27/2018   MPG 114 10/26/2016   No results found for: PROLACTIN Lab Results  Component Value Date   CHOL 158 01/23/2019   TRIG 63 01/23/2019   HDL 43 01/23/2019   CHOLHDL 3.7 01/23/2019   VLDL 13 01/23/2019   LDLCALC 102 (H) 01/23/2019   LDLCALC 67 05/27/2018     Physical Findings: AIMS: Facial and Oral Movements Muscles of Facial Expression: None, normal Lips and Perioral Area: None, normal Jaw: None, normal Tongue: None, normal,Extremity Movements Upper (arms, wrists, hands, fingers): None, normal Lower (legs, knees, ankles, toes): None, normal, Trunk Movements Neck, shoulders, hips: None, normal, Overall Severity Severity of abnormal movements (highest score from questions above): None, normal Incapacitation due to abnormal movements: None, normal [name] awareness of abnormal movements (rate only patient's report): No Awareness, Dental Status Current problems with teeth and/or dentures?: No(no teeth) Does patient usually wear dentures?: No  CIWA:    COWS:     Musculoskeletal: Strength & Muscle Tone: within normal limits Gait & Station: broad based Patient leans: N/A  Psychiatric Specialty Exam: Physical Exam  Nursing note and vitals reviewed. Constitutional: He is oriented to person, place, and time. He appears well-developed and well-nourished.  HENT:  Head: Normocephalic and atraumatic.  Respiratory: Effort normal.  Neurological: He is alert and oriented to person, place, and time.    ROS  Blood pressure 138/90, pulse 78, temperature 98.2 F (36.8 C), temperature source Oral, resp. rate 18, height 5' 9.5" (1.765 m), weight 90.3 kg, SpO2 100 %.Body mass index is 28.97 kg/m.  General Appearance: Casual  Eye Contact:  Fair  Speech:  Normal Rate  Volume:  Normal  Mood:  Dysphoric  Affect:  Congruent  Thought Process:  Coherent and Descriptions of Associations: Circumstantial  Orientation:  Full (Time, Place, and Person)  Thought Content:  Logical  Suicidal Thoughts:  Yes.  without intent/plan  Homicidal Thoughts:  No  Memory:  Immediate;   Fair Recent;   Fair Remote;   Fair  Judgement:  Intact  Insight:  Lacking  Psychomotor Activity:  Normal  Concentration:  Concentration: Fair and Attention Span: Fair  Recall:   [employer] of Knowledge:  Fair  Language:  Fair  Akathisia:  Negative  Handed:  Right  AIMS (if indicated):     Assets:  Desire for Improvement Resilience  ADL's:  Intact  Cognition:  WNL  Sleep:  Number of Hours: 6.75     Treatment Plan Summary: Daily contact with patient to assess and evaluate symptoms and progress in treatment, Medication management and Plan : Patient is seen and examined.  Patient is a 53 year old male with the above-stated past psychiatric history who is seen in follow-up.   Diagnosis: #1 cocaine dependence, #2 substance-induced mood disorder, #3 right foot pain, #4 HIV, #5 hypertension, #6 suspected secondary gain.  Patient is seen and examined.  I will increase his Celexa back up to 40 mg p.o. daily.  I will also increase his trazodone 250 mg p.o. nightly.  We will x-ray his right foot to make sure there are no fractures.  Otherwise we will continue his antihypertensive medication, his hyperlipidemia medication, the gabapentin for pain and anxiety, and his HIV medication.  We have encouraged him to contact the [place] houses that he has been at before, but he stated he owes them money.  I have also asked him to sign a release of information so we can contact his sister and brother to see if they can house him at least in the short run.  It does appear that the homeless shelters have no beds available, and he is very unhappy about that.  He also would like to get placed in a rehabilitation program, but those beds are highly limited at this time as well.  Hopefully we will be able to arrange some form of housing. 1.  Continue amlodipine 5 mg p.o. daily for hypertension. 2.  Continue Lipitor 10 mg p.o. nightly for hyperlipidemia. 3.  Continue Biktarvy for HIV. 4.  Increase citalopram to 40 mg p.o. daily. 5.  Continue gabapentin 300 mg p.o. twice daily for chronic pain and anxiety. 6.  Increase trazodone 250 mg p.o. nightly as needed insomnia. 7.  Disposition planning-in  progress.Antonieta Pert, MD 03/25/2019, 9:57 AM

## 2019-03-25 NOTE — Progress Notes (Signed)
D:  Roy [name] was up and visible on the unit.  He attended evening wrap up group.  He continued to voice passive SI and verbally agrees to not harm self on the unit.  He continued to voice being upset because of his missing glasses.  He did report having some difficulty with pain on the bottom of his right foot, "I think that I may have broken something" but denied any recent injury.  He reported that he uses a cane at home and is requesting walker.  Urged him to talk with MD tomorrow about this as we will need an order or possible OT consult.  He ambulated steadily but cautious on the unit.  Urged him to use handrails and seek out staff if his symptoms worsen.  He took hs medications along with tylenol prn without difficulty.  He is currently resting quietly with his eyes closed and appears to be asleep.   A:  1:1 with RN for support and encouragement.  Medications as ordered.  Q 15 minute checks maintained for safety.  Encouraged participation in group and unit activities.   R:  Roy [name] remains safe on the unit.  We will continue to monitor the progress towards his goals.

## 2019-03-25 NOTE — Progress Notes (Signed)
Patient stated in group that he had a bad day overall. He explained in group that he is to be discharged tomorrow but is unclear as to where he will be going. He would like to go to [facility] in [place] but will call them tomorrow. In addition, he mentioned that he can call the shelter in [place] as a second choice for housing. Another alternative would be to call and find room at one of the [facility] He talked about not having very many options for housing, but was not overly upset.

## 2019-03-25 NOTE — Plan of Care (Deleted)
  Problem: Activity: Goal: Interest or engagement in activities will improve Outcome: Progressing Goal: Sleeping patterns will improve Outcome: Progressing   Problem: Education: Goal: Knowledge of the prescribed therapeutic regimen will improve Outcome: Progressing

## 2019-03-25 NOTE — Progress Notes (Signed)
Patient ID: Roy Whitehall Sr., male   DOB: 1966/11/29, 53 y.o.   MRN: [id number]  Nursing Progress Note [phone]  Patient is calm and cooperative on approach. Patient is seen ambulating without any unsteadiness. He continues to endorse pain to his foot however x-ray was unremarkable. Patient presents disgruntled at times and states, "the system is failing [place], things were better when Obama was in office". Patient expressing interest in going to [facility] in [place] after discharge. Patient compliant with scheduled medications. Patient is seen attending groups and visible in the milieu. Patient currently denies SI/HI/AVH.   Patient is educated about and provided medication per provider's orders. Patient safety maintained with q15 min safety checks and low fall risk precautions. Emotional support given, 1:1 interaction, and active listening provided. Patient encouraged to attend meals, groups, and work on treatment plan and goals. Labs, vital signs and patient behavior monitored throughout shift.   Patient contracts for safety with staff. Patient remains safe on the unit at this time and agrees to come to staff with any issues/concerns. Will continue to support and monitor.

## 2019-03-25 NOTE — Progress Notes (Signed)
D:  Roy [name] was up and visible on the unit.  He continues to be upset about being discharged but did speak of a few places he could possible go.  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  He continued to voice pain in his foot and asked "why is it hurting."  Explained to him that he will have to follow up in an OP clinic for further treatment as his x-ray was unremarkable.  He voiced passive SI and verbally agrees to not harm himself on the unit.  He denied HI or A/V hallucinations. He took his hs medications without difficulty.  He is currently resting with his eyes closed and appears to be asleep. A:  1:1 with RN for support and encouragement.  Medications as ordered.  Q 15 minute checks maintained for safety.  Encouraged participation in group and unit activities.   R:  Roy [name] remains safe on the unit.  We will continue to monitor the progress towards his goals.

## 2019-03-25 NOTE — [facility] Group Notes (Signed)
Adult Psychoeducational Nursing Group Note  Date:  03/25/2019 Time:  4:15 PM  Group Topic/Focus: Anxiety Triggers and Coping Skills  Participation Level:  Active  Participation Quality:  Inattentive and Monopolizing  Affect:  Blunted  Cognitive:  Alert and Oriented  Insight: Lacking and Limited  Engagement in Group:  Limited and Monopolizing  Modes of Intervention:  Activity, Discussion, Education and Socialization  Additional Comments:  Patient states he is anxious about his upcoming discharge. Patient spoke of how the "system is failing him" and how he feels there should be more resources for the homeless. Patient demonstrated poor insight, was blaming others and failed to identify ways he could help himself.  Marchelle Folks A Celso Granja 03/25/2019, 5:00 PM

## 2019-03-26 ENCOUNTER — Emergency Department (HOSPITAL_COMMUNITY)
Admission: EM | Admit: 2019-03-26 | Discharge: 2019-03-26 | Disposition: A | Discharge status: Home / Self Care | Payer: Medicaid Other | Attending: Emergency Medicine | Admitting: Emergency Medicine

## 2019-03-26 ENCOUNTER — Other Ambulatory Visit: Payer: Self-pay

## 2019-03-26 DIAGNOSIS — R45851 Suicidal ideations: Secondary | ICD-10-CM | POA: Insufficient documentation

## 2019-03-26 DIAGNOSIS — I10 Essential (primary) hypertension: Secondary | ICD-10-CM | POA: Insufficient documentation

## 2019-03-26 DIAGNOSIS — F319 Bipolar disorder, unspecified: Secondary | ICD-10-CM | POA: Diagnosis not present

## 2019-03-26 DIAGNOSIS — B20 Human immunodeficiency virus [HIV] disease: Secondary | ICD-10-CM | POA: Insufficient documentation

## 2019-03-26 DIAGNOSIS — Z79899 Other long term (current) drug therapy: Secondary | ICD-10-CM | POA: Insufficient documentation

## 2019-03-26 DIAGNOSIS — F17210 Nicotine dependence, cigarettes, uncomplicated: Secondary | ICD-10-CM | POA: Insufficient documentation

## 2019-03-26 MED ORDER — ATORVASTATIN CALCIUM 10 MG PO TABS: 10.0000 mg | ORAL_TABLET | Freq: Every day | ORAL | 0 refills | Status: DC

## 2019-03-26 MED ORDER — TRAZODONE HCL 150 MG PO TABS: 150.0000 mg | ORAL_TABLET | Freq: Every evening | ORAL | 0 refills | Status: DC | PRN

## 2019-03-26 MED ORDER — AMLODIPINE BESYLATE 5 MG PO TABS: 5.0000 mg | ORAL_TABLET | Freq: Every day | ORAL | 0 refills | Status: DC

## 2019-03-26 MED ORDER — LACTASE 3000 UNITS PO TABS: 3000.0000 [IU] | ORAL_TABLET | Freq: Three times a day (TID) | ORAL | 0 refills | Status: DC

## 2019-03-26 MED ORDER — GABAPENTIN 300 MG PO CAPS: 300.0000 mg | ORAL_CAPSULE | Freq: Two times a day (BID) | ORAL | 0 refills | Status: DC

## 2019-03-26 MED ORDER — BICTEGRAVIR-EMTRICITAB-TENOFOV 50-200-25 MG PO TABS: 1.0000 | ORAL_TABLET | Freq: Every day | ORAL | 0 refills | Status: DC

## 2019-03-26 MED ORDER — CITALOPRAM HYDROBROMIDE 40 MG PO TABS: 40.0000 mg | ORAL_TABLET | Freq: Every day | ORAL | 0 refills | Status: DC

## 2019-03-26 MED ORDER — ALBUTEROL SULFATE HFA 108 (90 BASE) MCG/ACT IN AERS: 1.0000 | INHALATION_SPRAY | RESPIRATORY_TRACT | 0 refills | Status: DC | PRN

## 2019-03-26 NOTE — ED Notes (Addendum)
Pt d/c home per MD order. Pt agitated, blaming hospital and staff for being in [place]. Pt wanting to get back to [place]. Pt informs this nurse he was given a [employer] bus pass when left [facility] earlier, but had problems with the route due to [place] at this [place]. Pt decided to come back to the ED because he is not familiar with [facility] and is homeless; with hopes to get back to [place]. This nurse reached out to the sheriff department to see if able to provide pt courtsey ride back to [place]-unable at this time d/t staffing. This nurse then consulted social work and spoke with Denny Peon- unable to provide transport to [place] at this time. This nurse to room to inform patient and to explore/  review all shelters/resources in [place]. Pt not interested. Jacki Cones NP aware. Pt aware that city bus is free at this time. Pt demanding and unappreciated. Uninterested in any resources this nurse has to give. Pt threw AVS on the floor. Verbally aggressive. Security and [employer] on unit. Ambulatory off unit. Personal property returned.

## 2019-03-26 NOTE — Discharge Summary (Signed)
Physician Discharge Summary Note  Patient:  Roy Spar Sr. is an 53 y.o., male MRN:  [id number] DOB:  1966/03/24 Patient phone:  [phone] (home)  Patient address:   [address] [place] [address],  Total Time spent with patient: 15 minutes  Date of Admission:  03/23/2019 Date of Discharge: 03/26/19  Reason for Admission:  Cocaine abuse with suicidal ideation  Principal Problem: Substance induced mood disorder [facility]) Discharge Diagnoses: Principal Problem:   Substance induced mood disorder (HCC) Active Problems:   HIV disease (HCC)   HTN (hypertension)   Cocaine dependence (HCC)   Past Psychiatric History: From admission H&P: Patient admitted to multiple psychiatric hospitalizations in the past.  He was released from prison sometime between January and March 2020.  Since that time he has had 2 psychiatric hospitalizations, both in March.  1 at [facility], and the other at [facility].  Past Medical History:  Past Medical History:  Diagnosis Date  . AIDS (acquired immune deficiency syndrome) (HCC)   . Arthritis   . Asthma   . Bipolar disorder (HCC)   . Bronchitis   . Depression   . GERD (gastroesophageal reflux disease)   . Hepatitis C   . HIV (human immunodeficiency virus infection) (HCC)   . HTN (hypertension)     Past Surgical History:  Procedure Laterality Date  . HERNIA REPAIR    . TOE SURGERY     Family History:  Family History  Problem Relation Age of Onset  . Cancer Brother   . Uterine cancer Mother   . CAD Mother   . Hypertension Mother   . Hyperlipidemia Mother    Family Psychiatric  History: Noncontributory Social History:  Social History   Substance and Sexual Activity  Alcohol Use Yes  . Alcohol/week: 4.0 standard drinks  . Types: 4 Cans of beer per week     Social History   Substance and Sexual Activity  Drug Use Yes  . Types: Cocaine    Social History   Socioeconomic History  . Marital status: Divorced    Spouse  name: Not on file  . Number of children: Not on file  . Years of education: Not on file  . Highest education level: Not on file  Occupational History  . Not on file  Social Needs  . Financial resource strain: Not on file  . Food insecurity:    Worry: Not on file    Inability: Not on file  . Transportation needs:    Medical: Not on file    Non-medical: Not on file  Tobacco Use  . Smoking status: Current Every Day Smoker    Packs/day: 0.25  . Smokeless tobacco: Never Used  Substance and Sexual Activity  . Alcohol use: Yes    Alcohol/week: 4.0 standard drinks    Types: 4 Cans of beer per week  . Drug use: Yes    Types: Cocaine  . Sexual activity: Yes  Lifestyle  . Physical activity:    Days per week: Not on file    Minutes per session: Not on file  . Stress: Not on file  Relationships  . Social connections:    Talks on phone: Not on file    Gets together: Not on file    Attends religious service: Not on file    Active member of club or organization: Not on file    Attends meetings of clubs or organizations: Not on file    Relationship status: Not on file  Other Topics Concern  . Not on file  Social History Narrative  . Not on file    Hospital Course:  From admission H&P 03/24/2019: Patient is a 53 year old male with a past psychiatric history significant for cocaine dependence who presented to the [facility] with suicidal ideation. The patient stated he had just gotten his check on April 1, but became suicidal and tried to "overdose on cocaine". He stated he blew all of his money on cocaine. He stated at that point he considered taking trazodone overdose, but decided to go to the emergency room for help. The patient stated that he had just gotten out of prison somewhere between January and March of this year. He stated he had been suicidal since then because he was homeless. His last psychiatric hospitalizations were at [facility]  in March. He was discharged on Zoloft and trazodone. He stated that he has had bad luck his whole life and needed to find a place to live. He had been at an [facility] house, but he owes them to thousand dollars and was unable to get back in there until he was able to pay that. He is followed at the HIV clinic in [place]. He [facility] between 3/15 and 3/23. This again was for suicidal ideation. He was discharged to a homeless shelter at that time. He was discharged on trazodone and citalopram. He was at 40 mg a day for that. He was admitted to the hospital for evaluation and stabilization.  Roy [name] was admitted for cocaine abuse with suicidal ideation. Celexa and trazodone were restarted and increased. He had limited participation in group therapy. [facility]. He stabilized with medication and therapy. He was discharged on the medications listed below. He has shown improvement with improved mood, affect, sleep, appetite, and interaction. He denies any SI/HI/AVH and contracts for safety. He agrees [facility] (see below). He is provided with prescriptions for medications upon discharge. He is discharging to homeless shelter via bus.  Physical Findings: AIMS: Facial and Oral Movements Muscles of Facial Expression: None, normal Lips and Perioral Area: None, normal Jaw: None, normal Tongue: None, normal,Extremity Movements Upper (arms, wrists, hands, fingers): None, normal Lower (legs, knees, ankles, toes): None, normal, Trunk Movements Neck, shoulders, hips: None, normal, Overall Severity Severity of abnormal movements (highest score from questions above): None, normal Incapacitation due to abnormal movements: None, normal [name] awareness of abnormal movements (rate only patient's report): No Awareness, Dental Status Current problems with teeth and/or dentures?: No Does patient usually wear dentures?: No  CIWA:    COWS:      Musculoskeletal: Strength & Muscle Tone: within normal limits Gait & Station: normal Patient leans: N/A  Psychiatric Specialty Exam: Physical Exam  Nursing note and vitals reviewed. Constitutional: He is oriented to person, place, and time. He appears well-developed and well-nourished.  Cardiovascular: Normal rate.  Respiratory: Effort normal.  Neurological: He is alert and oriented to person, place, and time.    Review of Systems  Constitutional: Negative.   Psychiatric/Behavioral: Positive for substance abuse (cocaine). Negative for depression, hallucinations, memory loss and suicidal ideas. The patient is not nervous/anxious and does not have insomnia.     Blood pressure 138/88, pulse 67, temperature 98.7 F (37.1 C), temperature source Oral, resp. rate 16, height 5' 9.5" (1.765 m), weight 90.3 kg, SpO2 100 %.Body mass index is 28.97 kg/m.  See MD's discharge  SRA     Have you used any form of tobacco in the last 30 days? (Cigarettes, Smokeless Tobacco, Cigars, and/or Pipes): Yes  Has this patient used any form of tobacco in the last 30 days? (Cigarettes, Smokeless Tobacco, Cigars, and/or Pipes)  No  Blood Alcohol level:  Lab Results  Component Value Date   ETH <10 03/21/2019   ETH <10 02/19/2019    Metabolic Disorder Labs:  Lab Results  Component Value Date   HGBA1C 5.2 05/27/2018   MPG 102.54 05/27/2018   MPG 114 10/26/2016   No results found for: PROLACTIN Lab Results  Component Value Date   CHOL 158 01/23/2019   TRIG 63 01/23/2019   HDL 43 01/23/2019   CHOLHDL 3.7 01/23/2019   VLDL 13 01/23/2019   LDLCALC 102 (H) 01/23/2019   LDLCALC 67 05/27/2018    See Psychiatric Specialty Exam and Suicide Risk Assessment completed by Attending Physician prior to discharge.  Discharge destination:  Other:  Homeless shelter  Is patient on multiple antipsychotic therapies at discharge:  No   Has Patient had three or more failed trials of antipsychotic monotherapy  by history:  No  Recommended Plan for Multiple Antipsychotic Therapies: NA  Discharge Instructions    Discharge instructions   Complete by:  As directed    Patient is instructed to take all prescribed medications as recommended. Report any side effects or adverse reactions to your outpatient psychiatrist. Patient is instructed to abstain from alcohol and illegal drugs while on prescription medications. In the event of worsening symptoms, patient is instructed to call the crisis hotline, 911, or go to the nearest emergency department for evaluation and treatment.     Allergies as of 03/26/2019      Reactions   Lactose Other (See Comments)   GI distress   Pollen Extract Itching      Medication List    STOP taking these medications   acetaminophen 650 MG CR tablet Commonly known as:  TYLENOL   lactulose (encephalopathy) 10 GM/15ML Soln Commonly known as:  CHRONULAC   lisinopril 20 MG tablet Commonly known as:  PRINIVIL,ZESTRIL   sertraline 100 MG tablet Commonly known as:  ZOLOFT     TAKE these medications     Indication  albuterol 108 (90 Base) MCG/ACT inhaler Commonly known as:  PROVENTIL HFA;VENTOLIN HFA Inhale 1-2 puffs into the lungs every 4 (four) hours as needed for wheezing or shortness of breath. What changed:    how much to take  reasons to take this  Indication:  Asthma   amLODipine 5 MG tablet Commonly known as:  NORVASC Take 1 tablet (5 mg total) by mouth daily. For high blood pressure What changed:  additional instructions  Indication:  High Blood Pressure Disorder   atorvastatin 10 MG tablet Commonly known as:  LIPITOR Take 1 tablet (10 mg total) by mouth daily at 6 PM. For high cholesterol What changed:    medication strength  how much to take  additional instructions  Indication:  High Amount of Fats in the Blood   bictegravir-emtricitabine-tenofovir AF 50-200-25 MG Tabs tablet Commonly known as:  Biktarvy Take 1 tablet by mouth daily.  For HIV Start taking on:  March 27, 2019 What changed:  additional instructions  Indication:  HIV Disease   citalopram 40 MG tablet Commonly known as:  CELEXA Take 1 tablet (40 mg total) by mouth daily. For mood Start taking on:  March 27, 2019  Indication:  Mood   gabapentin 300  MG capsule Commonly known as:  NEURONTIN Take 1 capsule (300 mg total) by mouth 2 (two) times daily. What changed:  when to take this  Indication:  Abuse or Misuse of Alcohol   lactase 3000 units tablet Commonly known as:  LACTAID Take 1 tablet (3,000 Units total) by mouth 3 (three) times daily with meals.  Indication:  Inability of Body to Handle Lactose or Milk Sugar   traZODone 150 MG tablet Commonly known as:  DESYREL Take 1 tablet (150 mg total) by mouth at bedtime as needed for sleep. What changed:    medication strength  how much to take  when to take this  reasons to take this  Indication:  Trouble Sleeping      Follow-up Information    [facility] Follow up.   Specialty:  Addiction Medicine Why:  A referral for residential substance use treatment was made for you. If you wish to follow up, please call [phone]. Contact information: [address] [place] [address] [phone]        Patient Declines Follow up.   Contact information: Patient declines outpatient follow up          Follow-up recommendations: Activity as tolerated. Diet as recommended by primary care physician. Keep all scheduled follow-up appointments as recommended.   Comments:   Patient is instructed to take all prescribed medications as recommended. Report any side effects or adverse reactions to your outpatient psychiatrist. Patient is instructed to abstain from alcohol and illegal drugs while on prescription medications. In the event of worsening symptoms, patient is instructed to call the crisis hotline, 911, or go to the nearest emergency department for  evaluation and treatment.  Signed: Aldean Baker, NP 03/26/2019, 10:31 AM

## 2019-03-26 NOTE — ED Notes (Signed)
ED provider at bedside.

## 2019-03-26 NOTE — Progress Notes (Signed)
Patient ID: Roy Merisier Sr., male   DOB: 1966/11/24, 53 y.o.   MRN: [id number]  Patient voices concern about missing glasses. AC notified. Patient provided with Patient Experience number for further assistance.

## 2019-03-26 NOTE — BHH Suicide Risk Assessment (Signed)
[facility] Discharge Suicide Risk Assessment   Principal Problem: Substance induced mood disorder (HCC) Discharge Diagnoses: Principal Problem:   Substance induced mood disorder (HCC) Active Problems:   Cocaine dependence (HCC)   HTN (hypertension)   HIV disease (HCC)   Total Time spent with patient: 15 minutes  Musculoskeletal: Strength & Muscle Tone: within normal limits Gait & Station: normal Patient leans: N/A  Psychiatric Specialty Exam: Review of Systems  Musculoskeletal: Positive for joint pain.  All other systems reviewed and are negative.   Blood pressure 138/88, pulse 67, temperature 98.7 F (37.1 C), temperature source Oral, resp. rate 16, height 5' 9.5" (1.765 m), weight 90.3 kg, SpO2 100 %.Body mass index is 28.97 kg/m.  General Appearance: Casual  Eye Contact::  Fair  Speech:  Normal Rate409  Volume:  Normal  Mood:  Euthymic  Affect:  Congruent  Thought Process:  Coherent and Descriptions of Associations: Intact  Orientation:  Full (Time, Place, and Person)  Thought Content:  Logical  Suicidal Thoughts:  No  Homicidal Thoughts:  No  Memory:  Immediate;   Fair Recent;   Fair Remote;   Fair  Judgement:  Intact  Insight:  Lacking  Psychomotor Activity:  Normal  Concentration:  Good  Recall:  Fair  Fund of Knowledge:Fair  Language: Good  Akathisia:  Negative  Handed:  Right  AIMS (if indicated):     Assets:  Desire for Improvement Resilience  Sleep:  Number of Hours: 6.75  Cognition: WNL  ADL's:  Intact   Mental Status Per Nursing Assessment::   On Admission:  Suicidal ideation indicated by patient, Self-harm thoughts, Suicide plan  Demographic Factors:  Male, Divorced or widowed, Low socioeconomic status and Unemployed  Loss Factors: Legal issues  Historical Factors: Impulsivity  Risk Reduction Factors:   NA  Continued Clinical Symptoms:  Depression:   Comorbid alcohol abuse/dependence Impulsivity Alcohol/Substance  Abuse/Dependencies  Cognitive Features That Contribute To Risk:  Thought constriction (tunnel vision)    Suicide Risk:  Minimal: No identifiable suicidal ideation.  Patients presenting with no risk factors but with morbid ruminations; may be classified as minimal risk based on the severity of the depressive symptoms  Follow-up Information    [facility] Follow up.   Specialty:  Addiction Medicine Why:  A referral for residential substance use treatment was made for you. If you wish to follow up, please call [phone]. Contact information: [address] [place] [address] [phone]        Patient Declines Follow up.   Contact information: Patient declines outpatient follow up          Plan Of Care/Follow-up recommendations:  Activity:  ad lib  Antonieta Pert, MD 03/26/2019, 9:02 AM

## 2019-03-26 NOTE — ED Provider Notes (Signed)
[facility]-EMERGENCY DEPT Provider Note   CSN: [id number] Arrival date & time: 03/26/19  1509    History   Chief Complaint Chief Complaint  Patient presents with  . Psychiatric Evaluation    HPI Roy Saiki Sr. is a 53 y.o. male.     The history is provided by medical records and the patient. No language interpreter was used.   Roy Liter Zogg Sr. is a 53 y.o. male  with a PMH as listed below who presents to the Emergency Department complaining of suicidal thoughts.  Patient was just discharged from behavioral health today o'clock.  He states that he came frustrated because he is not familiar with [facility] and did not know his right around where to go.  He did somehow find his way to his friend's house whom he reports had a gun. He got the gun and had intention of killing himself but friend stopped him. He then came to the ER. He states that he has just continued to have bad luck which is making him depressed. Denies HI or AVH. No other complaints.   Past Medical History:  Diagnosis Date  . AIDS (acquired immune deficiency syndrome) (HCC)   . Arthritis   . Asthma   . Bipolar disorder (HCC)   . Bronchitis   . Depression   . GERD (gastroesophageal reflux disease)   . Hepatitis C   . HIV (human immunodeficiency virus infection) (HCC)   . HTN (hypertension)     Patient Active Problem List   Diagnosis Date Noted  . MDD (major depressive disorder), recurrent episode (HCC) 03/23/2019  . MDD (major depressive disorder), recurrent episode, moderate (HCC) 02/19/2019  . MDD (major depressive disorder), recurrent severe, without psychosis (HCC) 02/19/2019  . Chest pain 05/27/2018  . Major depressive disorder, recurrent severe without psychotic features (HCC) 05/27/2018  . ARF (acute renal failure) (HCC) 04/08/2018  . Malingering 03/07/2017  . Cocaine dependence (HCC) 02/19/2017  . Substance induced mood disorder (HCC) 02/19/2017  . HIV disease  (HCC) 10/26/2016  . HTN (hypertension) 10/26/2016  . Dyslipidemia 10/26/2016  . BPH (benign prostatic hyperplasia) 10/26/2016  . Constipation 10/26/2016    Past Surgical History:  Procedure Laterality Date  . HERNIA REPAIR    . TOE SURGERY          Home Medications    Prior to Admission medications   Medication Sig Start Date End Date Taking? Authorizing Provider  albuterol (PROVENTIL HFA;VENTOLIN HFA) 108 (90 Base) MCG/ACT inhaler Inhale 1-2 puffs into the lungs every 4 (four) hours as needed for wheezing or shortness of breath. 03/26/19  Yes Aldean Baker, NP  amLODipine (NORVASC) 5 MG tablet Take 1 tablet (5 mg total) by mouth daily. For high blood pressure 03/26/19  Yes Aldean Baker, NP  atorvastatin (LIPITOR) 10 MG tablet Take 1 tablet (10 mg total) by mouth daily at 6 PM. For high cholesterol 03/26/19  Yes Aldean Baker, NP  bictegravir-emtricitabine-tenofovir AF (BIKTARVY) 50-200-25 MG TABS tablet Take 1 tablet by mouth daily. For HIV 03/27/19  Yes Aldean Baker, NP  citalopram (CELEXA) 40 MG tablet Take 1 tablet (40 mg total) by mouth daily. For mood 03/27/19  Yes Aldean Baker, NP  gabapentin (NEURONTIN) 300 MG capsule Take 1 capsule (300 mg total) by mouth 2 (two) times daily. 03/26/19  Yes Aldean Baker, NP  lactase (LACTAID) 3000 units tablet Take 1 tablet (3,000 Units total) by mouth 3 (three) times daily with meals.  03/26/19  Yes Aldean Baker, NP  traZODone (DESYREL) 150 MG tablet Take 1 tablet (150 mg total) by mouth at bedtime as needed for sleep. 03/26/19  Yes Aldean Baker, NP    Family History Family History  Problem Relation Age of Onset  . Cancer Brother   . Uterine cancer Mother   . CAD Mother   . Hypertension Mother   . Hyperlipidemia Mother     Social History Social History   Tobacco Use  . Smoking status: Current Every Day Smoker    Packs/day: 0.25  . Smokeless tobacco: Never Used  Substance Use Topics  . Alcohol use: Yes    Alcohol/week: 4.0  standard drinks    Types: 4 Cans of beer per week  . Drug use: Yes    Types: Cocaine     Allergies   Lactose and Pollen extract   Review of Systems Review of Systems  Psychiatric/Behavioral: Positive for suicidal ideas.  All other systems reviewed and are negative.    Physical Exam Updated Vital Signs There were no vitals taken for this visit.  Physical Exam Vitals signs and nursing note reviewed.  Constitutional:      General: He is not in acute distress.    Appearance: He is well-developed.  HENT:     Head: Normocephalic and atraumatic.  Neck:     Musculoskeletal: Neck supple.  Cardiovascular:     Rate and Rhythm: Normal rate and regular rhythm.     Heart sounds: Normal heart sounds. No murmur.  Pulmonary:     Effort: Pulmonary effort is normal. No respiratory distress.     Breath sounds: Normal breath sounds.  Abdominal:     General: There is no distension.     Palpations: Abdomen is soft.     Tenderness: There is no abdominal tenderness.  Skin:    General: Skin is warm and dry.  Neurological:     Mental Status: He is alert and oriented to person, place, and time.  Psychiatric:     Comments: Does not appear to be responding to internal stimuli.       ED Treatments / Results  Labs (all labs ordered are listed, but only abnormal results are displayed) Labs Reviewed - No data to display  EKG None  Radiology Dg Foot 2 Views Right  Result Date: 03/25/2019 CLINICAL DATA:  Marthe Patch pop while walking 2 days ago.  Pain EXAM: RIGHT FOOT - 2 VIEW COMPARISON:  None. FINDINGS: Degenerative changes at the 1st MTP joint with joint space narrowing and spurring. Small plantar calcaneal spur. No acute bony abnormality. Specifically, no fracture, subluxation, or dislocation. IMPRESSION: No acute bony abnormality. Electronically Signed   By: Charlett Nose M.D.   On: 03/25/2019 10:30    Procedures Procedures (including critical care time)  Medications Ordered in ED  Medications - No data to display   Initial Impression / Assessment and Plan / ED Course  I have reviewed the triage vital signs and the nursing notes.  Pertinent labs & imaging results that were available during my care of the patient were reviewed by me and considered in my medical decision making (see chart for details).       Roy Liter Ngu Sr. is a 53 y.o. male who presents to ED for suicidal thoughts. Discharged from inpatient stay at behavioral health at 1PM today. He reports since leaving he has had a string of bad luck including difficulty with the bus and losing his glasses. These  unfortunate events have made him feel suicidal again. I discussed case with [facility] NP Elta Guadeloupe who reports that he has been seen by psychiatry today already and can be discharged from ED. He declined all outpatient resources at [facility] earlier. I included these in his AVS today - hopefully he will use them. [facility] referral was sent for him as well at [facility] which he declined. He was given the phone number for [facility] that he can call if he changes his mind.    Final Clinical Impressions(s) / ED Diagnoses   Final diagnoses:  Suicidal ideation    ED Discharge Orders    None       Noha Milberger, Chase Picket, PA-C 03/26/19 1621    Melene Plan, DO 03/26/19 2054

## 2019-03-26 NOTE — Progress Notes (Signed)
Patient ID: Roy Swartley Sr., male   DOB: 1966-05-15, 53 y.o.   MRN: [id number]  Discharge Note  Patient denies SI/HI and states readiness for discharge.  Written and verbal discharge instructions reviewed with the patient. Patient accepting to information and verbalized understanding with no concerns. All belongings returned to patient from the unit and secured lockers. Patient has completed their Suicide Safety Plan and has been provided Suicide Prevention resources. Patient provided an opportunity to complete and return Patient Satisfaction Survey.   Patient was safely escorted to the lobby for discharge. Patient discharged from [facility] with prescriptions, personal belongings, bus passes and discharge paperwork.

## 2019-03-26 NOTE — ED Triage Notes (Signed)
Pt to room 39. Pt alert. Pt knowledged just being DCd from [facility].  Pt stated transportation was not available to get to [place].  Pt went to friends house. Pt stated had gun to head, friend stopped him.

## 2019-03-26 NOTE — Progress Notes (Signed)
  [facility] Adult Case Management Discharge Plan :  Will you be returning to the same living situation after discharge:  No. Provided shelter resources. At discharge, do you have transportation home?: Yes,  buses are free, PART bus pass on chart. Do you have the ability to pay for your medications: Yes,  Medicaid  Release of information consent forms completed and in the chart;  Shelter Pharmacist, hospital and PART bus pass on chart. Patient to Follow up at: Follow-up Information    [facility] Follow up.   Specialty:  Addiction Medicine Why:  A referral for residential substance use treatment was made for you. If you wish to follow up, please call [phone]. Contact information: [address] [place] [address] [phone]        Patient Declines Follow up.   Contact information: Patient declines outpatient follow up          Next level of care provider has access to [facility] Link:no  Safety Planning and Suicide Prevention discussed: Yes,  with patient.  Have you used any form of tobacco in the last 30 days? (Cigarettes, Smokeless Tobacco, Cigars, and/or Pipes): Yes  Has patient been referred to the Quitline?: Patient refused referral  Patient has been referred for addiction treatment: Pt. refused referral  Darreld Mclean, LCSWA 03/26/2019, 9:11 AM

## 2019-04-16 MED ORDER — AMLODIPINE 5 MG TABLET
ORAL_TABLET | Freq: Every day | ORAL | 11 refills | 0.00000 days | Status: CP
Start: 2019-04-16 — End: 2019-05-16

## 2019-05-07 MED ORDER — FLUTICASONE PROPIONATE 50 MCG/ACTUATION NASAL SPRAY,SUSPENSION
Freq: Two times a day (BID) | NASAL | 6 refills | 0 days | Status: CP
Start: 2019-05-07 — End: 2019-11-23

## 2019-05-07 MED ORDER — GABAPENTIN 300 MG CAPSULE
ORAL_CAPSULE | Freq: Three times a day (TID) | ORAL | 1 refills | 0 days | Status: CP
Start: 2019-05-07 — End: ?

## 2019-05-07 MED ORDER — ALBUTEROL SULFATE HFA 90 MCG/ACTUATION AEROSOL INHALER
Freq: Four times a day (QID) | RESPIRATORY_TRACT | 6 refills | 0 days | Status: CP | PRN
Start: 2019-05-07 — End: 2020-05-06

## 2019-05-13 ENCOUNTER — Institutional Professional Consult (permissible substitution): Admit: 2019-05-13 | Discharge: 2019-05-14 | Payer: MEDICAID

## 2019-06-08 ENCOUNTER — Emergency Department (HOSPITAL_BASED_OUTPATIENT_CLINIC_OR_DEPARTMENT_OTHER)
Admission: EM | Admit: 2019-06-08 | Discharge: 2019-06-08 | Disposition: A | Discharge status: Home / Self Care | Payer: Medicaid Other | Attending: Emergency Medicine | Admitting: Emergency Medicine

## 2019-06-08 ENCOUNTER — Other Ambulatory Visit: Payer: Self-pay

## 2019-06-08 ENCOUNTER — Encounter (HOSPITAL_BASED_OUTPATIENT_CLINIC_OR_DEPARTMENT_OTHER): Payer: Self-pay

## 2019-06-08 ENCOUNTER — Emergency Department (HOSPITAL_BASED_OUTPATIENT_CLINIC_OR_DEPARTMENT_OTHER): Payer: Medicaid Other

## 2019-06-08 DIAGNOSIS — M1611 Unilateral primary osteoarthritis, right hip: Secondary | ICD-10-CM | POA: Diagnosis not present

## 2019-06-08 DIAGNOSIS — Z79899 Other long term (current) drug therapy: Secondary | ICD-10-CM | POA: Diagnosis not present

## 2019-06-08 DIAGNOSIS — B20 Human immunodeficiency virus [HIV] disease: Secondary | ICD-10-CM | POA: Insufficient documentation

## 2019-06-08 DIAGNOSIS — I10 Essential (primary) hypertension: Secondary | ICD-10-CM | POA: Insufficient documentation

## 2019-06-08 DIAGNOSIS — F17210 Nicotine dependence, cigarettes, uncomplicated: Secondary | ICD-10-CM | POA: Insufficient documentation

## 2019-06-08 DIAGNOSIS — J45909 Unspecified asthma, uncomplicated: Secondary | ICD-10-CM | POA: Diagnosis not present

## 2019-06-08 DIAGNOSIS — M25551 Pain in right hip: Secondary | ICD-10-CM | POA: Diagnosis present

## 2019-06-08 MED ORDER — ACETAMINOPHEN 500 MG PO TABS
1000.0000 mg | ORAL_TABLET | Freq: Once | ORAL | Status: AC
  Administered 2019-06-08: 1000 mg via ORAL

## 2019-06-08 MED ORDER — TRAMADOL HCL 50 MG PO TABS: 50.0000 mg | ORAL_TABLET | Freq: Three times a day (TID) | ORAL | 0 refills | Status: DC | PRN

## 2019-06-08 MED ORDER — AMLODIPINE BESYLATE 5 MG PO TABS
5.0000 mg | ORAL_TABLET | Freq: Once | ORAL | Status: AC
  Administered 2019-06-08: 5 mg via ORAL
  Filled 2019-06-08: qty 1

## 2019-06-08 NOTE — ED Triage Notes (Signed)
Pt c/o right hip pain-states hx of same and that he is in need of surgery-waiting for referral-NAD-steady gait

## 2019-06-08 NOTE — ED Provider Notes (Addendum)
[facility] EMERGENCY DEPARTMENT Provider Note   CSN: [id number] Arrival date & time: 06/08/19  1553     History   Chief Complaint Chief Complaint  Patient presents with  . Hip Pain    HPI Park LiterDanny Kay Weyrauch Sr. is a 53 y.o. male.     Patient c/o right hip pain for the past several weeks. Symptoms gradual onset, constant, moderate-severe, slowly worsening. Denies radicular pain or pain down leg. No leg numbness or weakness. No leg swelling. No skin changes, lesions, erythema or rash to area of pain. No low back pain. Indicates hx arthritis. No prior hip surgery. Denies hx ddd. No abrupt worsening today, but states pain makes it harder to work. Denies acute trauma or fall. No fever or chills.   The history is provided by the patient.  Hip Pain    Past Medical History:  Diagnosis Date  . AIDS (acquired immune deficiency syndrome) (HCC)   . Arthritis   . Asthma   . Bipolar disorder (HCC)   . Bronchitis   . Depression   . GERD (gastroesophageal reflux disease)   . Hepatitis C   . HIV (human immunodeficiency virus infection) (HCC)   . HTN (hypertension)     Patient Active Problem List   Diagnosis Date Noted  . MDD (major depressive disorder), recurrent episode (HCC) 03/23/2019  . MDD (major depressive disorder), recurrent episode, moderate (HCC) 02/19/2019  . MDD (major depressive disorder), recurrent severe, without psychosis (HCC) 02/19/2019  . Chest pain 05/27/2018  . Major depressive disorder, recurrent severe without psychotic features (HCC) 05/27/2018  . ARF (acute renal failure) (HCC) 04/08/2018  . Malingering 03/07/2017  . Cocaine dependence (HCC) 02/19/2017  . Substance induced mood disorder (HCC) 02/19/2017  . HIV disease (HCC) 10/26/2016  . HTN (hypertension) 10/26/2016  . Dyslipidemia 10/26/2016  . BPH (benign prostatic hyperplasia) 10/26/2016  . Constipation 10/26/2016    Past Surgical History:  Procedure Laterality Date  . HERNIA REPAIR     . TOE SURGERY          Home Medications    Prior to Admission medications   Medication Sig Start Date End Date Taking? Authorizing Provider  albuterol (PROVENTIL HFA;VENTOLIN HFA) 108 (90 Base) MCG/ACT inhaler Inhale 1-2 puffs into the lungs every 4 (four) hours as needed for wheezing or shortness of breath. 03/26/19   Aldean BakerSykes, Janet E, NP  amLODipine (NORVASC) 5 MG tablet Take 1 tablet (5 mg total) by mouth daily. For high blood pressure 03/26/19   Aldean BakerSykes, Janet E, NP  atorvastatin (LIPITOR) 10 MG tablet Take 1 tablet (10 mg total) by mouth daily at 6 PM. For high cholesterol 03/26/19   Aldean BakerSykes, Janet E, NP  bictegravir-emtricitabine-tenofovir AF (BIKTARVY) 50-200-25 MG TABS tablet Take 1 tablet by mouth daily. For HIV 03/27/19   Aldean BakerSykes, Janet E, NP  citalopram (CELEXA) 40 MG tablet Take 1 tablet (40 mg total) by mouth daily. For mood 03/27/19   Aldean BakerSykes, Janet E, NP  gabapentin (NEURONTIN) 300 MG capsule Take 1 capsule (300 mg total) by mouth 2 (two) times daily. 03/26/19   Aldean BakerSykes, Janet E, NP  lactase (LACTAID) 3000 units tablet Take 1 tablet (3,000 Units total) by mouth 3 (three) times daily with meals. 03/26/19   Aldean BakerSykes, Janet E, NP  traZODone (DESYREL) 150 MG tablet Take 1 tablet (150 mg total) by mouth at bedtime as needed for sleep. 03/26/19   Aldean BakerSykes, Janet E, NP    Family History Family History  Problem Relation Age  of Onset  . Cancer Brother   . Uterine cancer Mother   . CAD Mother   . Hypertension Mother   . Hyperlipidemia Mother     Social History Social History   Tobacco Use  . Smoking status: Current Every Day Smoker    Packs/day: 0.25    Types: Cigarettes  . Smokeless tobacco: Never Used  Substance Use Topics  . Alcohol use: Yes    Comment: daily  . Drug use: Not Currently     Allergies   Lactose and Pollen extract   Review of Systems Review of Systems  Constitutional: Negative for chills and fever.  Cardiovascular: Negative for leg swelling.  Genitourinary: Negative for  flank pain.  Musculoskeletal: Negative for back pain.  Skin: Negative for rash and wound.  Neurological: Negative for weakness and numbness.     Physical Exam Updated Vital Signs BP (!) 167/108 (BP Location: Left Arm)   Pulse (!) 50   Temp 99 F (37.2 C) (Oral)   Resp 20   Ht 1.753 m (5\' 9" )   Wt 103 kg   SpO2 99%   BMI 33.52 kg/m   Physical Exam Vitals signs and nursing note reviewed.  Constitutional:      Appearance: Normal appearance. He is well-developed.  HENT:     Head: Atraumatic.     Nose: Nose normal.     Mouth/Throat:     Mouth: Mucous membranes are moist.     Pharynx: Oropharynx is clear.  Eyes:     General: No scleral icterus.    Conjunctiva/sclera: Conjunctivae normal.  Neck:     Musculoskeletal: Normal range of motion and neck supple. No neck rigidity.     Trachea: No tracheal deviation.  Cardiovascular:     Rate and Rhythm: Normal rate.     Pulses: Normal pulses.  Pulmonary:     Effort: Pulmonary effort is normal. No accessory muscle usage or respiratory distress.  Genitourinary:    Comments: No cva tenderness. Musculoskeletal:        General: No swelling or tenderness.     Right lower leg: No edema.     Left lower leg: No edema.     Comments: Patient notes pain right hip. Good passive rom right hip and knee without pain. No leg swelling. Dp/pt 2+. L/S spine non tender, aligned, no step off.   Skin:    General: Skin is warm and dry.     Findings: No rash.  Neurological:     Mental Status: He is alert.     Comments: Alert, speech clear. RLE motor stre 5/5. sens grossly intact. Steady gait.   Psychiatric:        Mood and Affect: Mood normal.      ED Treatments / Results  Labs (all labs ordered are listed, but only abnormal results are displayed) Labs Reviewed - No data to display  EKG    Radiology Dg Hip Unilat W Or W/o Pelvis 2-3 Views Right  Result Date: 06/08/2019 CLINICAL DATA:  Right hip pain. EXAM: DG HIP (WITH OR WITHOUT  PELVIS) 2-3V RIGHT COMPARISON:  Radiograph 02/17/2017 FINDINGS: Advanced right hip osteoarthritis with near complete joint space loss and subchondral sclerosis and cystic change. Acetabular and femoral head osteophytes. No fracture or bony destructive change. Mild to moderate left hip osteoarthritis. Pubic symphysis and sacroiliac joints are congruent. IMPRESSION: Advanced right hip osteoarthritis.  No acute findings. Electronically Signed   By: Narda Rutherford M.D.   On: 06/08/2019 18:59  Procedures Procedures (including critical care time)  Medications Ordered in ED Medications  acetaminophen (TYLENOL) tablet 1,000 mg (1,000 mg Oral Given 06/08/19 1718)     Initial Impression / Assessment and Plan / ED Course  I have reviewed the triage vital signs and the nursing notes.  Pertinent labs & imaging results that were available during my care of the patient were reviewed by me and considered in my medical decision making (see chart for details).  Xrays.  Reviewed nursing notes and prior charts for additional history.   Acetaminophen po.  xrays reviewed by me -  Severe djd changes.   Discussed xrays w pt.  Recheck pt, comfortable, no distress.  Pt indicates he has adequate of his bp meds at home. Has not yet taken today. Amlodipine po.  No cp or discomfort. No sob. No headache.   Pt unsure of which local pharmacy for his pain med/ultram - therefore will provide paper script.   Pt currently appears stable for d/c.     Final Clinical Impressions(s) / ED Diagnoses   Final diagnoses:  None    ED Discharge Orders    None              Lajean Saver, MD 06/08/19 [id number]

## 2019-06-08 NOTE — Discharge Instructions (Addendum)
It was our pleasure to provide your ER care today - we hope that you feel better.  For hip pain/degenerative joint disease - follow up with orthopedist in the next couple weeks - call office to arrange appointment.   Your blood pressure is high today - take your blood pressure medication, and follow up with primary care doctor in the next 1-2 weeks.  You may take ultram as need for pain - no driving when taking.   Return to ER if worse, new symptoms, fevers, intractable pain, other concern.

## 2019-06-09 ENCOUNTER — Telehealth: Payer: Self-pay | Admitting: *Deleted

## 2019-06-09 NOTE — Telephone Encounter (Signed)
[facility] ED TOC CM -referral PCP/4 ED visits/3 IP admissions in past 6 months  Spoke to pt and states his Leta Speller # [phone] arranged an appt with a new PCP, [facility] [phone] on Friday. States he does not have any details. Contacted [facility] and appt is 06/12/2019 on 11 am with Dr Pamella Pert. Updated new address. States he staying with his brother temp until he and his fiance get their own place. Provided pt with address, phone and time. Jonnie Finner RN CCM Case Mgmt phone [phone]

## 2019-06-11 ENCOUNTER — Telehealth: Payer: Self-pay | Admitting: *Deleted

## 2019-06-11 NOTE — Telephone Encounter (Signed)
Received call from pt requesting information on his appt. Provided pt with address and time from appt with [facility] with Devonne Doughty NP on tomorrow at 10:45 am on [place], [employer]. Jonnie Finner RN CCM Case Mgmt phone [phone]

## 2019-07-11 ENCOUNTER — Encounter: Payer: Self-pay | Admitting: Emergency Medicine

## 2019-07-11 ENCOUNTER — Other Ambulatory Visit: Payer: Self-pay

## 2019-07-11 ENCOUNTER — Emergency Department
Admission: EM | Admit: 2019-07-11 | Discharge: 2019-07-11 | Disposition: A | Discharge status: Home / Self Care | Payer: Medicaid Other | Attending: Emergency Medicine | Admitting: Emergency Medicine

## 2019-07-11 DIAGNOSIS — F17210 Nicotine dependence, cigarettes, uncomplicated: Secondary | ICD-10-CM | POA: Insufficient documentation

## 2019-07-11 DIAGNOSIS — K5900 Constipation, unspecified: Secondary | ICD-10-CM

## 2019-07-11 DIAGNOSIS — I10 Essential (primary) hypertension: Secondary | ICD-10-CM | POA: Diagnosis not present

## 2019-07-11 DIAGNOSIS — Z79899 Other long term (current) drug therapy: Secondary | ICD-10-CM | POA: Insufficient documentation

## 2019-07-11 DIAGNOSIS — R195 Other fecal abnormalities: Secondary | ICD-10-CM | POA: Insufficient documentation

## 2019-07-11 DIAGNOSIS — J45909 Unspecified asthma, uncomplicated: Secondary | ICD-10-CM | POA: Insufficient documentation

## 2019-07-11 LAB — COMPREHENSIVE METABOLIC PANEL
ALT: 38 U/L (ref 0–44)
AST: 30 U/L (ref 15–41)
Albumin: 4.1 g/dL (ref 3.5–5.0)
Alkaline Phosphatase: 79 U/L (ref 38–126)
Anion gap: 5 (ref 5–15)
BUN: 16 mg/dL (ref 6–20)
CO2: 25 mmol/L (ref 22–32)
Calcium: 9.1 mg/dL (ref 8.9–10.3)
Chloride: 107 mmol/L (ref 98–111)
Creatinine, Ser: 1.19 mg/dL (ref 0.61–1.24)
GFR calc Af Amer: >60 mL/min (ref >60)
GFR calc non Af Amer: >60 mL/min (ref >60)
Glucose, Bld: 105 mg/dL — ABNORMAL HIGH (ref 70–99)
Potassium: 4.3 mmol/L (ref 3.5–5.1)
Sodium: 137 mmol/L (ref 135–145)
Total Bilirubin: 0.6 mg/dL (ref 0.3–1.2)
Total Protein: 8 g/dL (ref 6.5–8.1)

## 2019-07-11 LAB — CBC WITH DIFFERENTIAL/PLATELET
Abs Immature Granulocytes: 0.01 10*3/uL (ref 0.00–0.07)
Basophils Absolute: 0 10*3/uL (ref 0.0–0.1)
Basophils Relative: 1 %
Eosinophils Absolute: 0.1 10*3/uL (ref 0.0–0.5)
Eosinophils Relative: 2 %
HCT: 45.8 % (ref 39.0–52.0)
Hemoglobin: 15.8 g/dL (ref 13.0–17.0)
Immature Granulocytes: 0 %
Lymphocytes Relative: 57 %
Lymphs Abs: 2.1 10*3/uL (ref 0.7–4.0)
MCH: 33.1 pg (ref 26.0–34.0)
MCHC: 34.5 g/dL (ref 30.0–36.0)
MCV: 95.8 fL (ref 80.0–100.0)
Monocytes Absolute: 0.3 10*3/uL (ref 0.1–1.0)
Monocytes Relative: 9 %
Neutro Abs: 1.1 10*3/uL — ABNORMAL LOW (ref 1.7–7.7)
Neutrophils Relative %: 31 %
Platelets: 213 10*3/uL (ref 150–400)
RBC: 4.78 MIL/uL (ref 4.22–5.81)
RDW: 11.8 % (ref 11.5–15.5)
WBC: 3.7 10*3/uL — ABNORMAL LOW (ref 4.0–10.5)
nRBC: 0 % (ref 0.0–0.2)

## 2019-07-11 LAB — PROTIME-INR
INR: 1.1 (ref 0.8–1.2)
Prothrombin Time: 13.8 seconds (ref 11.4–15.2)

## 2019-07-11 MED ORDER — SODIUM CHLORIDE 0.9 % IV BOLUS
500.0000 mL | Freq: Once | INTRAVENOUS | Status: AC
  Administered 2019-07-11: 11:00:00 500 mL via INTRAVENOUS

## 2019-07-11 NOTE — Discharge Instructions (Addendum)
Your dark stools were likely caused by your Pepto-Bismol, try stopping the Pepto-Bismol and see if it helps.  Try taking MiraLAX over-the-counter for your constipation.  If you have abdominal pain fever vomiting bloody stools headedness or you feel worse in any way please return to the emergency room

## 2019-07-11 NOTE — ED Triage Notes (Signed)
Pt to ED via POV c/o abdominal pain and dark stools. Pt states that he called his MD at [facility] who advised him to come in and be evaluated for GI bleed. Pt is in NAD at this time.

## 2019-07-11 NOTE — ED Provider Notes (Signed)
[facility] Emergency Department Provider Note  ____________________________________________   I have reviewed the triage vital signs and the nursing notes. Where available I have reviewed prior notes and, if possible and indicated, outside hospital notes.   Patient seen and evaluated during the coronavirus epidemic during a time with low staffing  Patient seen for the symptoms described in the history of present illness. She was evaluated in the context of the global COVID-19 pandemic, which necessitated consideration that the patient might be at risk for infection with the SARS-CoV-2 virus that causes COVID-19. Institutional protocols and algorithms that pertain to the evaluation of patients at risk for COVID-19 are in a state of rapid change based on information released by regulatory bodies including the CDC and federal and state organizations. These policies and algorithms were followed during the patient's care in the ED.    HISTORY  Chief Complaint Abdominal Pain    HPI Roy LiterDanny Kay Burrowes Sr. is a 53 y.o. male  With a history of HIV, nondetectable viral load, under the care of infectious disease.  No history of GI bleed.  Not on aspirin, states he is supposed to be has not taken it in several months, does not take any nonsteroidals  is not on any blood thinners.  He does suffer from constipation.  He states his been constipated for couple days.  He states he took Pepto-Bismol because he thought it would help with the constipation.  Started 3 days ago.  Then his stool turned black.  He has had no hematemesis.  No bright red blood per rectum.  He let his infectious disease doctors know he was having black stools, and they sent him to the emergency room for further evaluation.  He has no abdominal pain.  He states sometimes he has a feeling like he has to have a bowel movement and then it feels better after he has a bowel movement.  Last bowel movement was this morning.   Stools are hard and well formed.     Past Medical History:  Diagnosis Date  . AIDS (acquired immune deficiency syndrome) (HCC)   . Arthritis   . Asthma   . Bipolar disorder (HCC)   . Bronchitis   . Depression   . GERD (gastroesophageal reflux disease)   . Hepatitis C   . HIV (human immunodeficiency virus infection) (HCC)   . HTN (hypertension)     Patient Active Problem List   Diagnosis Date Noted  . MDD (major depressive disorder), recurrent episode (HCC) 03/23/2019  . MDD (major depressive disorder), recurrent episode, moderate (HCC) 02/19/2019  . MDD (major depressive disorder), recurrent severe, without psychosis (HCC) 02/19/2019  . Chest pain 05/27/2018  . Major depressive disorder, recurrent severe without psychotic features (HCC) 05/27/2018  . ARF (acute renal failure) (HCC) 04/08/2018  . Malingering 03/07/2017  . Cocaine dependence (HCC) 02/19/2017  . Substance induced mood disorder (HCC) 02/19/2017  . HIV disease (HCC) 10/26/2016  . HTN (hypertension) 10/26/2016  . Dyslipidemia 10/26/2016  . BPH (benign prostatic hyperplasia) 10/26/2016  . Constipation 10/26/2016    Past Surgical History:  Procedure Laterality Date  . HERNIA REPAIR    . TOE SURGERY      Prior to Admission medications   Medication Sig Start Date End Date Taking? Authorizing Provider  albuterol (PROVENTIL HFA;VENTOLIN HFA) 108 (90 Base) MCG/ACT inhaler Inhale 1-2 puffs into the lungs every 4 (four) hours as needed for wheezing or shortness of breath. 03/26/19   Aldean BakerSykes, Janet E,  NP  amLODipine (NORVASC) 5 MG tablet Take 1 tablet (5 mg total) by mouth daily. For high blood pressure 03/26/19   Aldean Baker, NP  atorvastatin (LIPITOR) 10 MG tablet Take 1 tablet (10 mg total) by mouth daily at 6 PM. For high cholesterol 03/26/19   Aldean Baker, NP  bictegravir-emtricitabine-tenofovir AF (BIKTARVY) 50-200-25 MG TABS tablet Take 1 tablet by mouth daily. For HIV 03/27/19   Aldean Baker, NP  citalopram  (CELEXA) 40 MG tablet Take 1 tablet (40 mg total) by mouth daily. For mood 03/27/19   Aldean Baker, NP  gabapentin (NEURONTIN) 300 MG capsule Take 1 capsule (300 mg total) by mouth 2 (two) times daily. 03/26/19   Aldean Baker, NP  lactase (LACTAID) 3000 units tablet Take 1 tablet (3,000 Units total) by mouth 3 (three) times daily with meals. 03/26/19   Aldean Baker, NP  traMADol (ULTRAM) 50 MG tablet Take 1 tablet (50 mg total) by mouth every 8 (eight) hours as needed. 06/08/19   Cathren Laine, MD  traZODone (DESYREL) 150 MG tablet Take 1 tablet (150 mg total) by mouth at bedtime as needed for sleep. 03/26/19   Aldean Baker, NP    Allergies Lactose and Pollen extract  Family History  Problem Relation Age of Onset  . Cancer Brother   . Uterine cancer Mother   . CAD Mother   . Hypertension Mother   . Hyperlipidemia Mother     Social History Social History   Tobacco Use  . Smoking status: Current Every Day Smoker    Packs/day: 0.25    Types: Cigarettes  . Smokeless tobacco: Never Used  Substance Use Topics  . Alcohol use: Yes    Comment: daily  . Drug use: Not Currently    Review of Systems Constitutional: No fever/chills Eyes: No visual changes. ENT: No sore throat. No stiff neck no neck pain Cardiovascular: Denies chest pain. Respiratory: Denies shortness of breath. Gastrointestinal:   no vomiting.  No diarrhea.  + constipation. Genitourinary: Negative for dysuria. Musculoskeletal: Negative lower extremity swelling Skin: Negative for rash. Neurological: Negative for severe headaches, focal weakness or numbness.   ____________________________________________   PHYSICAL EXAM:  VITAL SIGNS: ED Triage Vitals  Enc Vitals Group     BP 07/11/19 1018 (!) 148/111     Pulse Rate 07/11/19 1018 60     Resp 07/11/19 1018 17     Temp 07/11/19 1018 99 F (37.2 C)     Temp Source 07/11/19 1018 Oral     SpO2 07/11/19 1018 97 %     Weight --      Height --      Head  Circumference --      Peak Flow --      Pain Score 07/11/19 1014 5     Pain Loc --      Pain Edu? --      Excl. in GC? --     Constitutional: Alert and oriented. Well appearing and in no acute distress. Eyes: Conjunctivae are normal Head: Atraumatic HEENT: No congestion/rhinnorhea. Mucous membranes are moist.  Oropharynx non-erythematous Neck:   Nontender with no meningismus, no masses, no stridor Cardiovascular: Normal rate, regular rhythm. Grossly normal heart sounds.  Good peripheral circulation. Respiratory: Normal respiratory effort.  No retractions. Lungs CTAB. Abdominal: Soft and nontender. No distention. No guarding no rebound Back:  There is no focal tenderness or step off.  there is no midline tenderness there are no  lesions noted. there is no CVA tenderness Rectal exam: Dark stool which is guaiac negative Musculoskeletal: No lower extremity tenderness, no upper extremity tenderness. No joint effusions, no DVT signs strong distal pulses no edema Neurologic:  Normal speech and language. No gross focal neurologic deficits are appreciated.  Skin:  Skin is warm, dry and intact. No rash noted. Psychiatric: Mood and affect are normal. Speech and behavior are normal.  ____________________________________________   LABS (all labs ordered are listed, but only abnormal results are displayed)  Labs Reviewed  CBC WITH DIFFERENTIAL/PLATELET  PROTIME-INR  COMPREHENSIVE METABOLIC PANEL    Pertinent labs  results that were available during my care of the patient were reviewed by me and considered in my medical decision making (see chart for details). ____________________________________________  EKG  I personally interpreted any EKGs ordered by me or triage  ____________________________________________  RADIOLOGY  Pertinent labs & imaging results that were available during my care of the patient were reviewed by me and considered in my medical decision making (see chart for  details). If possible, patient and/or family made aware of any abnormal findings.  No results found. ____________________________________________    PROCEDURES  Procedure(s) performed: None  Procedures  Critical Care performed: None  ____________________________________________   INITIAL IMPRESSION / ASSESSMENT AND PLAN / ED COURSE  Pertinent labs & imaging results that were available during my care of the patient were reviewed by me and considered in my medical decision making (see chart for details).   Patient here with dark stool after taking Pepto-Bismol, also complaining of some degree of constipation.  Abdomen is completely benign we will check basic blood work but does not appear to be a GI bleed.  More likely this is secondary to the Pepto-Bismol.  If hemoglobin is reassuring, I think would be safe to get him home with close outpatient follow-up and something for his constipation.  Even though he is guaiac negative I will still check a CBC as this was going on for couple days.    ____________________________________________   FINAL CLINICAL IMPRESSION(S) / ED DIAGNOSES  Final diagnoses:  None      This chart was dictated using voice recognition software.  Despite best efforts to proofread,  errors can occur which can change meaning.      Schuyler Amor, MD 07/11/19 1105

## 2019-07-14 ENCOUNTER — Other Ambulatory Visit: Payer: Self-pay

## 2019-07-14 DIAGNOSIS — Z20822 Contact with and (suspected) exposure to covid-19: Secondary | ICD-10-CM

## 2019-07-16 LAB — NOVEL CORONAVIRUS, NAA: SARS-CoV-2, NAA: NOT DETECTED

## 2019-07-20 ENCOUNTER — Institutional Professional Consult (permissible substitution): Admit: 2019-07-20 | Discharge: 2019-07-21 | Payer: MEDICAID | Attending: Registered" | Primary: Registered"

## 2019-07-24 ENCOUNTER — Encounter: Payer: Self-pay | Admitting: Emergency Medicine

## 2019-07-24 ENCOUNTER — Observation Stay
Admission: EM | Admit: 2019-07-24 | Discharge: 2019-07-25 | Disposition: A | Discharge status: Home / Self Care | Payer: Medicaid Other | Attending: Specialist | Admitting: Specialist

## 2019-07-24 ENCOUNTER — Telehealth: Payer: Self-pay

## 2019-07-24 ENCOUNTER — Other Ambulatory Visit: Payer: Self-pay

## 2019-07-24 ENCOUNTER — Emergency Department: Payer: Medicaid Other

## 2019-07-24 DIAGNOSIS — T380X5A Adverse effect of glucocorticoids and synthetic analogues, initial encounter: Secondary | ICD-10-CM | POA: Diagnosis not present

## 2019-07-24 DIAGNOSIS — K219 Gastro-esophageal reflux disease without esophagitis: Secondary | ICD-10-CM | POA: Insufficient documentation

## 2019-07-24 DIAGNOSIS — Z8349 Family history of other endocrine, nutritional and metabolic diseases: Secondary | ICD-10-CM | POA: Diagnosis not present

## 2019-07-24 DIAGNOSIS — B20 Human immunodeficiency virus [HIV] disease: Secondary | ICD-10-CM | POA: Diagnosis present

## 2019-07-24 DIAGNOSIS — E785 Hyperlipidemia, unspecified: Secondary | ICD-10-CM | POA: Diagnosis not present

## 2019-07-24 DIAGNOSIS — Z8249 Family history of ischemic heart disease and other diseases of the circulatory system: Secondary | ICD-10-CM | POA: Diagnosis not present

## 2019-07-24 DIAGNOSIS — T464X5A Adverse effect of angiotensin-converting-enzyme inhibitors, initial encounter: Secondary | ICD-10-CM | POA: Insufficient documentation

## 2019-07-24 DIAGNOSIS — N400 Enlarged prostate without lower urinary tract symptoms: Secondary | ICD-10-CM | POA: Insufficient documentation

## 2019-07-24 DIAGNOSIS — Z79899 Other long term (current) drug therapy: Secondary | ICD-10-CM | POA: Insufficient documentation

## 2019-07-24 DIAGNOSIS — D72829 Elevated white blood cell count, unspecified: Secondary | ICD-10-CM | POA: Insufficient documentation

## 2019-07-24 DIAGNOSIS — G629 Polyneuropathy, unspecified: Secondary | ICD-10-CM | POA: Diagnosis not present

## 2019-07-24 DIAGNOSIS — T887XXA Unspecified adverse effect of drug or medicament, initial encounter: Secondary | ICD-10-CM | POA: Diagnosis not present

## 2019-07-24 DIAGNOSIS — B1920 Unspecified viral hepatitis C without hepatic coma: Secondary | ICD-10-CM | POA: Diagnosis not present

## 2019-07-24 DIAGNOSIS — Z716 Tobacco abuse counseling: Secondary | ICD-10-CM | POA: Diagnosis not present

## 2019-07-24 DIAGNOSIS — Z20828 Contact with and (suspected) exposure to other viral communicable diseases: Secondary | ICD-10-CM | POA: Insufficient documentation

## 2019-07-24 DIAGNOSIS — J45909 Unspecified asthma, uncomplicated: Secondary | ICD-10-CM | POA: Insufficient documentation

## 2019-07-24 DIAGNOSIS — F17210 Nicotine dependence, cigarettes, uncomplicated: Secondary | ICD-10-CM | POA: Insufficient documentation

## 2019-07-24 DIAGNOSIS — T783XXA Angioneurotic edema, initial encounter: Principal | ICD-10-CM

## 2019-07-24 DIAGNOSIS — F319 Bipolar disorder, unspecified: Secondary | ICD-10-CM | POA: Diagnosis not present

## 2019-07-24 DIAGNOSIS — I10 Essential (primary) hypertension: Secondary | ICD-10-CM | POA: Insufficient documentation

## 2019-07-24 DIAGNOSIS — Z888 Allergy status to other drugs, medicaments and biological substances status: Secondary | ICD-10-CM | POA: Insufficient documentation

## 2019-07-24 DIAGNOSIS — Y848 Other medical procedures as the cause of abnormal reaction of the patient, or of later complication, without mention of misadventure at the time of the procedure: Secondary | ICD-10-CM | POA: Insufficient documentation

## 2019-07-24 LAB — COMPREHENSIVE METABOLIC PANEL
ALT: 26 U/L (ref 0–44)
AST: 25 U/L (ref 15–41)
Albumin: 4.2 g/dL (ref 3.5–5.0)
Alkaline Phosphatase: 74 U/L (ref 38–126)
Anion gap: 8 (ref 5–15)
BUN: 32 mg/dL — ABNORMAL HIGH (ref 6–20)
CO2: 21 mmol/L — ABNORMAL LOW (ref 22–32)
Calcium: 8.9 mg/dL (ref 8.9–10.3)
Chloride: 103 mmol/L (ref 98–111)
Creatinine, Ser: 1.43 mg/dL — ABNORMAL HIGH (ref 0.61–1.24)
GFR calc Af Amer: >60 mL/min (ref >60)
GFR calc non Af Amer: 55 mL/min — ABNORMAL LOW (ref >60)
Glucose, Bld: 144 mg/dL — ABNORMAL HIGH (ref 70–99)
Potassium: 4.6 mmol/L (ref 3.5–5.1)
Sodium: 132 mmol/L — ABNORMAL LOW (ref 135–145)
Total Bilirubin: 1.2 mg/dL (ref 0.3–1.2)
Total Protein: 8.1 g/dL (ref 6.5–8.1)

## 2019-07-24 LAB — CBC WITH DIFFERENTIAL/PLATELET
Abs Immature Granulocytes: 0.01 10*3/uL (ref 0.00–0.07)
Basophils Absolute: 0 10*3/uL (ref 0.0–0.1)
Basophils Relative: 1 %
Eosinophils Absolute: 0 10*3/uL (ref 0.0–0.5)
Eosinophils Relative: 0 %
HCT: 46 % (ref 39.0–52.0)
Hemoglobin: 16.3 g/dL (ref 13.0–17.0)
Immature Granulocytes: 0 %
Lymphocytes Relative: 18 %
Lymphs Abs: 0.8 10*3/uL (ref 0.7–4.0)
MCH: 33.4 pg (ref 26.0–34.0)
MCHC: 35.4 g/dL (ref 30.0–36.0)
MCV: 94.3 fL (ref 80.0–100.0)
Monocytes Absolute: 0.1 10*3/uL (ref 0.1–1.0)
Monocytes Relative: 1 %
Neutro Abs: 3.6 10*3/uL (ref 1.7–7.7)
Neutrophils Relative %: 80 %
Platelets: 214 10*3/uL (ref 150–400)
RBC: 4.88 MIL/uL (ref 4.22–5.81)
RDW: 11.9 % (ref 11.5–15.5)
WBC: 4.4 10*3/uL (ref 4.0–10.5)
nRBC: 0 % (ref 0.0–0.2)

## 2019-07-24 LAB — SARS CORONAVIRUS 2 BY RT PCR (HOSPITAL ORDER, PERFORMED IN ~~LOC~~ HOSPITAL LAB): SARS Coronavirus 2: NEGATIVE

## 2019-07-24 MED ORDER — TRAZODONE HCL 50 MG PO TABS: 150.0000 mg | ORAL_TABLET | Freq: Every evening | ORAL | Status: DC | PRN

## 2019-07-24 MED ORDER — DOCUSATE SODIUM 100 MG PO CAPS: 100.0000 mg | ORAL_CAPSULE | Freq: Two times a day (BID) | ORAL | Status: DC | PRN

## 2019-07-24 MED ORDER — ALBUTEROL SULFATE (2.5 MG/3ML) 0.083% IN NEBU: 2.5000 mg | INHALATION_SOLUTION | RESPIRATORY_TRACT | Status: DC | PRN

## 2019-07-24 MED ORDER — FAMOTIDINE IN NACL 20-0.9 MG/50ML-% IV SOLN
20.0000 mg | Freq: Once | INTRAVENOUS | Status: AC
  Administered 2019-07-24: 18:00:00 20 mg via INTRAVENOUS
  Filled 2019-07-24: qty 50

## 2019-07-24 MED ORDER — FAMOTIDINE IN NACL 20-0.9 MG/50ML-% IV SOLN
20.0000 mg | Freq: Once | INTRAVENOUS | Status: AC
  Administered 2019-07-24: 20 mg via INTRAVENOUS
  Filled 2019-07-24: qty 50

## 2019-07-24 MED ORDER — ATORVASTATIN CALCIUM 20 MG PO TABS: 10.0000 mg | ORAL_TABLET | Freq: Every day | ORAL | Status: DC

## 2019-07-24 MED ORDER — METHYLPREDNISOLONE SODIUM SUCC 125 MG IJ SOLR
125.0000 mg | Freq: Once | INTRAMUSCULAR | Status: AC
  Administered 2019-07-24: 125 mg via INTRAVENOUS
  Filled 2019-07-24: qty 2

## 2019-07-24 MED ORDER — DOLUTEGRAVIR SODIUM 50 MG PO TABS
50.0000 mg | ORAL_TABLET | Freq: Every day | ORAL | Status: DC
  Administered 2019-07-24 – 2019-07-25 (×2): 50 mg via ORAL
  Filled 2019-07-24 (×3): qty 1

## 2019-07-24 MED ORDER — CITALOPRAM HYDROBROMIDE 20 MG PO TABS
40.0000 mg | ORAL_TABLET | Freq: Every day | ORAL | Status: DC
  Administered 2019-07-25: 11:00:00 40 mg via ORAL
  Filled 2019-07-24: qty 2

## 2019-07-24 MED ORDER — DIPHENHYDRAMINE HCL 50 MG/ML IJ SOLN
50.0000 mg | Freq: Once | INTRAMUSCULAR | Status: AC
  Administered 2019-07-24: 50 mg via INTRAVENOUS
  Filled 2019-07-24: qty 1

## 2019-07-24 MED ORDER — HEPARIN SODIUM (PORCINE) 5000 UNIT/ML IJ SOLN
5000.0000 [IU] | Freq: Three times a day (TID) | INTRAMUSCULAR | Status: DC
  Administered 2019-07-24 – 2019-07-25 (×2): 5000 [IU] via SUBCUTANEOUS
  Filled 2019-07-24 (×2): qty 1

## 2019-07-24 MED ORDER — BICTEGRAVIR-EMTRICITAB-TENOFOV 50-200-25 MG PO TABS
1.0000 | ORAL_TABLET | Freq: Every day | ORAL | Status: DC
  Administered 2019-07-25: 1 via ORAL
  Filled 2019-07-24: qty 1

## 2019-07-24 MED ORDER — GABAPENTIN 300 MG PO CAPS
300.0000 mg | ORAL_CAPSULE | Freq: Two times a day (BID) | ORAL | Status: DC
  Administered 2019-07-24 – 2019-07-25 (×2): 300 mg via ORAL
  Filled 2019-07-24 (×2): qty 1

## 2019-07-24 MED ORDER — DIPHENHYDRAMINE HCL 50 MG/ML IJ SOLN
25.0000 mg | Freq: Two times a day (BID) | INTRAMUSCULAR | Status: AC
  Administered 2019-07-24 – 2019-07-25 (×2): 25 mg via INTRAVENOUS
  Filled 2019-07-24 (×2): qty 1

## 2019-07-24 MED ORDER — METHYLPREDNISOLONE SODIUM SUCC 125 MG IJ SOLR
60.0000 mg | Freq: Four times a day (QID) | INTRAMUSCULAR | Status: DC
  Administered 2019-07-24 – 2019-07-25 (×4): 60 mg via INTRAVENOUS
  Filled 2019-07-24 (×4): qty 2

## 2019-07-24 MED ORDER — LACTASE 3000 UNITS PO TABS
3000.0000 [IU] | ORAL_TABLET | Freq: Three times a day (TID) | ORAL | Status: DC
  Administered 2019-07-25 (×2): 3000 [IU] via ORAL
  Filled 2019-07-24 (×3): qty 1

## 2019-07-24 MED ORDER — AMLODIPINE BESYLATE 5 MG PO TABS
5.0000 mg | ORAL_TABLET | Freq: Every day | ORAL | Status: DC
  Administered 2019-07-25: 5 mg via ORAL
  Filled 2019-07-24: qty 1

## 2019-07-24 NOTE — Plan of Care (Signed)
  Problem: Education: Goal: Knowledge of General Education information will improve Description Including pain rating scale, medication(s)/side effects and non-pharmacologic comfort measures Outcome: Progressing   Problem: Health Behavior/Discharge Planning: Goal: Ability to manage health-related needs will improve Outcome: Progressing   

## 2019-07-24 NOTE — ED Provider Notes (Signed)
[facility] Emergency Department Provider Note  ____________________________________________  Time seen: Approximately 1:24 PM  I have reviewed the triage vital signs and the nursing notes.   HISTORY  Chief Complaint Angioedema    HPI Roy Zietz Sr. is a 53 y.o. male with a history of bipolar, hepatitis C, HIV, hypertension who comes the ED complaining of swelling of his tongue and throat that started about 1:00 AM today.  He relates it to eating at a [place] last night for dinner.  He is also been on lisinopril for 14 years, no changes in dosage or stopping and restarting recently.  No known food allergy.  Denies shortness of breath but feels like it is difficult to swallow.   Constant, no aggravating or alleviating factors.  Never had anything like this before     Past Medical History:  Diagnosis Date  . AIDS (acquired immune deficiency syndrome) ([place])   . Arthritis   . Asthma   . Bipolar disorder ([place])   . Bronchitis   . Depression   . GERD (gastroesophageal reflux disease)   . Hepatitis C   . HIV (human immunodeficiency virus infection) ([place])   . HTN (hypertension)      Patient Active Problem List   Diagnosis Date Noted  . MDD (major depressive disorder), recurrent episode ([place]) 03/23/2019  . MDD (major depressive disorder), recurrent episode, moderate ([place]) 02/19/2019  . MDD (major depressive disorder), recurrent severe, without psychosis ([place]) 02/19/2019  . Chest pain 05/27/2018  . Major depressive disorder, recurrent severe without psychotic features ([place]) 05/27/2018  . ARF (acute renal failure) ([place]) 04/08/2018  . Malingering 03/07/2017  . Cocaine dependence ([place]) 02/19/2017  . Substance induced mood disorder ([place]) 02/19/2017  . HIV disease ([place]) 10/26/2016  . HTN (hypertension) 10/26/2016  . Dyslipidemia 10/26/2016  . BPH (benign prostatic hyperplasia) 10/26/2016  . Constipation 10/26/2016     Past  Surgical History:  Procedure Laterality Date  . HERNIA REPAIR    . TOE SURGERY       Prior to Admission medications   Medication Sig Start Date End Date Taking? Authorizing Provider  albuterol (PROVENTIL HFA;VENTOLIN HFA) 108 (90 Base) MCG/ACT inhaler Inhale 1-2 puffs into the lungs every 4 (four) hours as needed for wheezing or shortness of breath. 03/26/19   Connye Burkitt, NP  amLODipine (NORVASC) 5 MG tablet Take 1 tablet (5 mg total) by mouth daily. For high blood pressure 03/26/19   Connye Burkitt, NP  atorvastatin (LIPITOR) 10 MG tablet Take 1 tablet (10 mg total) by mouth daily at 6 PM. For high cholesterol 03/26/19   Connye Burkitt, NP  bictegravir-emtricitabine-tenofovir AF (BIKTARVY) 50-200-25 MG TABS tablet Take 1 tablet by mouth daily. For HIV 03/27/19   Connye Burkitt, NP  citalopram (CELEXA) 40 MG tablet Take 1 tablet (40 mg total) by mouth daily. For mood 03/27/19   Connye Burkitt, NP  gabapentin (NEURONTIN) 300 MG capsule Take 1 capsule (300 mg total) by mouth 2 (two) times daily. 03/26/19   Connye Burkitt, NP  lactase (LACTAID) 3000 units tablet Take 1 tablet (3,000 Units total) by mouth 3 (three) times daily with meals. 03/26/19   Connye Burkitt, NP  traMADol (ULTRAM) 50 MG tablet Take 1 tablet (50 mg total) by mouth every 8 (eight) hours as needed. 06/08/19   Lajean Saver, MD  traZODone (DESYREL) 150 MG tablet Take 1 tablet (150 mg total) by mouth at bedtime as needed for  sleep. 03/26/19   Aldean BakerSykes, Janet E, NP     Allergies Lactose and Pollen extract   Family History  Problem Relation Age of Onset  . Cancer Brother   . Uterine cancer Mother   . CAD Mother   . Hypertension Mother   . Hyperlipidemia Mother     Social History Social History   Tobacco Use  . Smoking status: Current Every Day Smoker    Packs/day: 0.25    Types: Cigarettes  . Smokeless tobacco: Never Used  Substance Use Topics  . Alcohol use: Yes    Comment: daily  . Drug use: Not Currently    Review of  Systems  Constitutional:   No fever or chills.  ENT:   No sore throat. No rhinorrhea.  Tongue and throat swelling as above Cardiovascular:   No chest pain or syncope. Respiratory:   No dyspnea or cough. Gastrointestinal:   Negative for abdominal pain, vomiting and diarrhea.  Musculoskeletal:   Negative for focal pain or swelling All other systems reviewed and are negative except as documented above in ROS and HPI.  ____________________________________________   PHYSICAL EXAM:  VITAL SIGNS: ED Triage Vitals  Enc Vitals Group     BP 07/24/19 0844 130/89     Pulse Rate 07/24/19 0844 62     Resp 07/24/19 0844 16     Temp 07/24/19 0844 98.6 F (37 C)     Temp Source 07/24/19 0844 Oral     SpO2 07/24/19 0844 100 %     Weight 07/24/19 0843 230 lb (104.3 kg)     Height 07/24/19 0843 5\' 9"  (1.753 m)     Head Circumference --      Peak Flow --      Pain Score 07/24/19 0843 0     Pain Loc --      Pain Edu? --      Excl. in GC? --     Vital signs reviewed, nursing assessments reviewed.   Constitutional:   Alert and oriented. Non-toxic appearance. Eyes:   Conjunctivae are normal. EOMI. PERRL. ENT      Head:   Normocephalic and atraumatic.      Nose:   No congestion/rhinnorhea.       Mouth/Throat:   MMM, there is angioedema of the tongue and floor of mouth.  Lips appear unremarkable.  Submandibular space feels full on palpation without focal mass. no pharyngeal erythema. No peritonsillar mass no uvula edema..  Patient is managing secretions without difficulty.      Neck:   No meningismus. Full ROM. Hematological/Lymphatic/Immunilogical:   No cervical lymphadenopathy. Cardiovascular:   RRR. Symmetric bilateral radial and DP pulses.  No murmurs. Cap refill less than 2 seconds. Respiratory:   Normal respiratory effort without tachypnea/retractions. Breath sounds are clear and equal bilaterally. No wheezes/rales/rhonchi.  No stridor Gastrointestinal:   Soft and nontender. Non  distended. There is no CVA tenderness.  No rebound, rigidity, or guarding. Musculoskeletal:   Normal range of motion in all extremities. No joint effusions.  No lower extremity tenderness.  No edema. Neurologic:   Normal speech and language.  Motor grossly intact. No acute focal neurologic deficits are appreciated.  Skin:    Skin is warm, dry and intact. No rash noted.  No petechiae, purpura, or bullae.  ____________________________________________    LABS (pertinent positives/negatives) (all labs ordered are listed, but only abnormal results are displayed) Labs Reviewed  CBC WITH DIFFERENTIAL/PLATELET  COMPREHENSIVE METABOLIC PANEL   ____________________________________________  EKG    ____________________________________________    RADIOLOGY  Dg Neck Soft Tissue  Result Date: 07/24/2019 CLINICAL DATA:  Tongue and throat swelling since last night, pain with swallowing. EXAM: NECK SOFT TISSUES - 1+ VIEW COMPARISON:  Radiographs of the cervical spine 08/22/2013 FINDINGS: There is no evidence of retropharyngeal soft tissue swelling or epiglottic enlargement. Question submandibular/floor of mouth soft tissue swelling. The cervical airway is unremarkable and no radio-opaque foreign body identified. Degenerative change of the cervical spine with multilevel ventral osteophytes. IMPRESSION: No evidence of retropharyngeal soft tissue swelling or epiglottic enlargement. Question submandibular/floor of mouth soft tissue swelling. Electronically Signed   By: Jackey Loge   On: 07/24/2019 09:41    ____________________________________________   PROCEDURES Procedures  ____________________________________________    CLINICAL IMPRESSION / ASSESSMENT AND PLAN / ED COURSE  Medications ordered in the ED: Medications  methylPREDNISolone sodium succinate (SOLU-MEDROL) 125 mg/2 mL injection 125 mg (125 mg Intravenous Given 07/24/19 1003)  diphenhydrAMINE (BENADRYL) injection 50 mg (50 mg  Intravenous Given 07/24/19 1003)  famotidine (PEPCID) IVPB 20 mg premix (0 mg Intravenous Stopped 07/24/19 1035)    Pertinent labs & imaging results that were available during my care of the patient were reviewed by me and considered in my medical decision making (see chart for details).  Roy Kotarski Gail Sr. was evaluated in Emergency Department on 07/24/2019 for the symptoms described in the history of present illness. He was evaluated in the context of the global COVID-19 pandemic, which necessitated consideration that the patient might be at risk for infection with the SARS-CoV-2 virus that causes COVID-19. Institutional protocols and algorithms that pertain to the evaluation of patients at risk for COVID-19 are in a state of rapid change based on information released by regulatory bodies including the CDC and federal and state organizations. These policies and algorithms were followed during the patient's care in the ED.     Clinical Course as of Jul 24 1323  Fri Jul 24, 2019  [id number] Patient presents with tongue and throat swelling that started overnight at about 1:00 AM after eating at a [employer] yesterday evening.  He is also on lisinopril continuously for the past 14 years.  Exam does show edema of the tongue and floor of mouth without stridor wheezing or respiratory distress.  Managing secretions.  Will check a chest x-ray give steroids and antihistamines and observe in the ED   [PS]    Clinical Course User Index [PS] Sharman Cheek, MD    ----------------------------------------- 1:26 PM on 07/24/2019 -----------------------------------------  Persistent swelling of tongue and floor mouth.  Patient feels like it is a little bit harder to swallow, but does take crackers and peanut butter.  He is having some new swelling of the upper lip as well.  With his lack of improvement and gradual worsening despite steroids and antihistamines, he will need to be hospitalized for  further observation of airway status and intervention as needed.  Currently would not intubate.  Vital signs are normal.   ____________________________________________   FINAL CLINICAL IMPRESSION(S) / ED DIAGNOSES    Final diagnoses:  Angioedema, initial encounter  HIV infection, unspecified symptom status [facility])     ED Discharge Orders    None      Portions of this note were generated with dragon dictation software. Dictation errors may occur despite best attempts at proofreading.   Sharman Cheek, MD 07/24/19 1327

## 2019-07-24 NOTE — ED Notes (Signed)
Pt states he's currently on lisinopril and has been taking it for 14 years. Pt has swelling noted under his neck on the lateral sides. Pt in NAD at this time. Pt states "the swelling to his tongue and throat started at 1am and his tongue has since then decreased in size, however the swelling is spreading down his neck further than originally onset". O2 sats 97% on RA.

## 2019-07-24 NOTE — ED Notes (Signed)
Pt reports that his throat feels sore. Pt st that "its tightness hasn't changed" since last reported status

## 2019-07-24 NOTE — H&P (Signed)
[employer] - [place] at [facility]   PATIENT NAME: Roy [name]    MR#:  [id number]  DATE OF BIRTH:  1966-10-10  DATE OF ADMISSION:  07/24/2019  PRIMARY CARE PHYSICIAN: System, Pcp Not In   REQUESTING/REFERRING PHYSICIAN: Scotty Court  CHIEF COMPLAINT:   Chief Complaint  Patient presents with  . Angioedema    HISTORY OF PRESENT ILLNESS: Roy [name]  is a 53 y.o. male with a known history of ARDS, hepatitis C, bipolar disorder, bronchitis, depression, gastroesophageal reflux disease, hypertension-was taking lisinopril for many years.  He follows regularly at his HIV clinic at [facility] and his viral load was undetectable and CD4 count was more than 600 as per him. Today around 1:00 in the early morning he woke up with some tongue swelling and since then that has got some worse he received some injections in the emergency room and his tongue swelling got better but he still have feeling of some swelling in his throat where he has some difficulty in swallowing solid food but he can swallow liquids fine and he denies any difficulty in breathing.  He denies any cough or fever associated with that.  ER physician gave initial treatment with IV steroid, IV Benadryl, IV Pepcid and due to his condition suggested to monitor under medical service until he resolves.  PAST MEDICAL HISTORY:   Past Medical History:  Diagnosis Date  . AIDS (acquired immune deficiency syndrome) ([facility])   . Arthritis   . Asthma   . Bipolar disorder ([facility])   . Bronchitis   . Depression   . GERD (gastroesophageal reflux disease)   . Hepatitis C   . HIV (human immunodeficiency virus infection) ([facility])   . HTN (hypertension)     PAST SURGICAL HISTORY:  Past Surgical History:  Procedure Laterality Date  . HERNIA REPAIR    . TOE SURGERY      SOCIAL HISTORY:  Social History   Tobacco Use  . Smoking status: Current Every Day Smoker    Packs/day: 0.25    Types: Cigarettes  . Smokeless tobacco: Never Used   Substance Use Topics  . Alcohol use: Yes    Comment: daily    FAMILY HISTORY:  Family History  Problem Relation Age of Onset  . Cancer Brother   . Uterine cancer Mother   . CAD Mother   . Hypertension Mother   . Hyperlipidemia Mother     DRUG ALLERGIES:  Allergies  Allergen Reactions  . Lactose Other (See Comments)    GI distress  . Pollen Extract Itching    REVIEW OF SYSTEMS:   CONSTITUTIONAL: No fever, fatigue or weakness.  EYES: No blurred or double vision.  EARS, NOSE, AND THROAT: No tinnitus or ear pain.  Has feeling of swelling in his throat. RESPIRATORY: No cough, shortness of breath, wheezing or hemoptysis.  CARDIOVASCULAR: No chest pain, orthopnea, edema.  GASTROINTESTINAL: No nausea, vomiting, diarrhea or abdominal pain.  GENITOURINARY: No dysuria, hematuria.  ENDOCRINE: No polyuria, nocturia,  HEMATOLOGY: No anemia, easy bruising or bleeding SKIN: No rash or lesion. MUSCULOSKELETAL: No joint pain or arthritis.   NEUROLOGIC: No tingling, numbness, weakness.  PSYCHIATRY: No anxiety or depression.   MEDICATIONS AT HOME:  Prior to Admission medications   Medication Sig Start Date End Date Taking? Authorizing Provider  amLODipine (NORVASC) 10 MG tablet Take 10 mg by mouth daily. Take along with 5 mg tablet for total 15 mg 06/12/19  Yes [provider]  amLODipine (NORVASC) 5 MG tablet  Take 1 tablet (5 mg total) by mouth daily. For high blood pressure 03/26/19  Yes Aldean BakerSykes, Janet E, NP  atorvastatin (LIPITOR) 10 MG tablet Take 1 tablet (10 mg total) by mouth daily at 6 PM. For high cholesterol 03/26/19  Yes Aldean BakerSykes, Janet E, NP  bictegravir-emtricitabine-tenofovir AF (BIKTARVY) 50-200-25 MG TABS tablet Take 1 tablet by mouth daily. For HIV 03/27/19  Yes Aldean BakerSykes, Janet E, NP  citalopram (CELEXA) 40 MG tablet Take 1 tablet (40 mg total) by mouth daily. For mood 03/27/19  Yes Aldean BakerSykes, Janet E, NP  dolutegravir (TIVICAY) 50 MG tablet Take 50 mg by mouth daily. 10/31/15   Yes [provider]  gabapentin (NEURONTIN) 300 MG capsule Take 1 capsule (300 mg total) by mouth 2 (two) times daily. 03/26/19  Yes Aldean BakerSykes, Janet E, NP  lactase (LACTAID) 3000 units tablet Take 1 tablet (3,000 Units total) by mouth 3 (three) times daily with meals. 03/26/19  Yes Aldean BakerSykes, Janet E, NP  traZODone (DESYREL) 150 MG tablet Take 1 tablet (150 mg total) by mouth at bedtime as needed for sleep. Patient taking differently: Take 150 mg by mouth at bedtime as needed for sleep. Takes 300 mg at bedtime 03/26/19  Yes Aldean BakerSykes, Janet E, NP  albuterol (PROVENTIL HFA;VENTOLIN HFA) 108 (90 Base) MCG/ACT inhaler Inhale 1-2 puffs into the lungs every 4 (four) hours as needed for wheezing or shortness of breath. 03/26/19   Aldean BakerSykes, Janet E, NP  traMADol (ULTRAM) 50 MG tablet Take 1 tablet (50 mg total) by mouth every 8 (eight) hours as needed. Patient not taking: Reported on 07/24/2019 06/08/19   Cathren LaineSteinl, Kevin, MD      PHYSICAL EXAMINATION:   VITAL SIGNS: Blood pressure 111/83, pulse 65, temperature 98.6 F (37 C), temperature source Oral, resp. rate 16, height 5\' 9"  (1.753 m), weight 104.3 kg, SpO2 99 %.  GENERAL:  53 y.o.-year-old patient lying in the bed with no acute distress.  EYES: Pupils equal, round, reactive to light and accommodation. No scleral icterus. Extraocular muscles intact.  HEENT: Head atraumatic, normocephalic. Oropharynx and nasopharynx clear.  Slight swelling on upper lip. NECK:  Supple, no jugular venous distention. No thyroid enlargement, no tenderness.  LUNGS: Normal breath sounds bilaterally, no wheezing, rales,rhonchi or crepitation. No use of accessory muscles of respiration.  CARDIOVASCULAR: S1, S2 normal. No murmurs, rubs, or gallops.  ABDOMEN: Soft, nontender, nondistended. Bowel sounds present. No organomegaly or mass.  EXTREMITIES: No pedal edema, cyanosis, or clubbing.  NEUROLOGIC: Cranial nerves II through XII are intact. Muscle strength 5/5 in all extremities. Sensation  intact. Gait not checked.  PSYCHIATRIC: The patient is alert and oriented x 3.  SKIN: No obvious rash, lesion, or ulcer.   LABORATORY PANEL:   CBC Recent Labs  Lab 07/24/19 1355  WBC 4.4  HGB 16.3  HCT 46.0  PLT 214  MCV 94.3  MCH 33.4  MCHC 35.4  RDW 11.9  LYMPHSABS 0.8  MONOABS 0.1  EOSABS 0.0  BASOSABS 0.0   ------------------------------------------------------------------------------------------------------------------  Chemistries  Recent Labs  Lab 07/24/19 1355  NA 132*  K 4.6  CL 103  CO2 21*  GLUCOSE 144*  BUN 32*  CREATININE 1.43*  CALCIUM 8.9  AST 25  ALT 26  ALKPHOS 74  BILITOT 1.2   ------------------------------------------------------------------------------------------------------------------ estimated creatinine clearance is 70.2 mL/min (A) (by C-G formula based on SCr of 1.43 mg/dL (H)). ------------------------------------------------------------------------------------------------------------------ No results for input(s): TSH, T4TOTAL, T3FREE, THYROIDAB in the last 72 hours.  Invalid input(s): FREET3   Coagulation profile  No results for input(s): INR, PROTIME in the last 168 hours. ------------------------------------------------------------------------------------------------------------------- No results for input(s): DDIMER in the last 72 hours. -------------------------------------------------------------------------------------------------------------------  Cardiac Enzymes No results for input(s): CKMB, TROPONINI, MYOGLOBIN in the last 168 hours.  Invalid input(s): CK ------------------------------------------------------------------------------------------------------------------ Invalid input(s): POCBNP  ---------------------------------------------------------------------------------------------------------------  Urinalysis    Component Value Date/Time   COLORURINE STRAW (A) 02/23/2019 1010   APPEARANCEUR CLEAR (A)  02/23/2019 1010   APPEARANCEUR Clear 01/06/2015 1505   LABSPEC 1.013 02/23/2019 1010   LABSPEC 1.023 01/06/2015 1505   PHURINE 7.0 02/23/2019 1010   GLUCOSEU NEGATIVE 02/23/2019 1010   GLUCOSEU Negative 01/06/2015 1505   HGBUR SMALL (A) 02/23/2019 1010   BILIRUBINUR NEGATIVE 02/23/2019 1010   BILIRUBINUR Negative 01/06/2015 1505   KETONESUR NEGATIVE 02/23/2019 1010   PROTEINUR NEGATIVE 02/23/2019 1010   NITRITE NEGATIVE 02/23/2019 1010   LEUKOCYTESUR NEGATIVE 02/23/2019 1010   LEUKOCYTESUR Negative 01/06/2015 1505     RADIOLOGY: Dg Neck Soft Tissue  Result Date: 07/24/2019 CLINICAL DATA:  Tongue and throat swelling since last night, pain with swallowing. EXAM: NECK SOFT TISSUES - 1+ VIEW COMPARISON:  Radiographs of the cervical spine 08/22/2013 FINDINGS: There is no evidence of retropharyngeal soft tissue swelling or epiglottic enlargement. Question submandibular/floor of mouth soft tissue swelling. The cervical airway is unremarkable and no radio-opaque foreign body identified. Degenerative change of the cervical spine with multilevel ventral osteophytes. IMPRESSION: No evidence of retropharyngeal soft tissue swelling or epiglottic enlargement. Question submandibular/floor of mouth soft tissue swelling. Electronically Signed   By: Jackey LogeKyle  Golden   On: 07/24/2019 09:41    EKG: Orders placed or performed during the hospital encounter of 01/22/19  . ED EKG  . ED EKG  . EKG 12-Lead  . EKG 12-Lead  . EKG 12-Lead  . EKG 12-Lead    IMPRESSION AND PLAN:  *Angioedema Likely lisinopril induced Continue IV Benadryl, Pepcid, Solu-Medrol as started by ER. Currently no difficulty breathing. Continue to monitor on medical floor.  I advised patient to report to nurses immediately if he feels any difficulty with his breathing. Lisinopril is added to his allergy list. As patient has HIV, I have discussed with ID physician if there is any conditions in HIV patients which could cause angioedema  to look into that if any further testing will be questions needed. As per ID with my discussion, patient's CD4 count was more than 600 so does not need any further acute testing now he can follow with his primary care as outpatient.  *HIV CD4 count was more than 600, continue same home medication and advised to follow-up with his ID clinic in next 1 to 2 weeks.  *Hypertension Continue amlodipine.  *Hyperlipidemia Continue atorvastatin.  *Active smoking Counseled to quit smoking for 4 minutes and offered nicotine patch.  All the records are reviewed and case discussed with ED provider. Management plans discussed with the patient, family and they are in agreement.  CODE STATUS: Full. Code Status History    Date Active Date Inactive Code Status Order ID Comments User Context   03/23/2019 2036 03/26/2019 1509 Full Code [id number]  Oneta RackLewis, Tanika N, NP Inpatient   02/19/2019 1449 02/23/2019 1416 Full Code [id number]  Mariel CraftMaurer, Sheila M, MD Inpatient   01/22/2019 1735 01/25/2019 0136 Full Code [id number]  Alford HighlandWieting, Richard, MD ED   05/27/2018 1645 06/03/2018 1526 Full Code [id number]  Shari ProwsPucilowska, Jolanta B, MD Inpatient   05/27/2018 0108 05/27/2018 1558 Full Code [id number]  Cammy CopaMaier, Angela, MD ED   04/08/2018 2008 04/10/2018 1334 Full  Code [id number]  Demetrios Loll, MD Inpatient   02/20/2017 1725 02/27/2017 2237 Full Code [id number]  Gonzella Lex, MD Inpatient   10/25/2016 2038 10/29/2016 2139 Full Code [id number]  Clapacs, Madie Reno, MD Inpatient   Advance Care Planning Activity       TOTAL TIME TAKING CARE OF THIS PATIENT: 45 minutes.    Vaughan Basta M.D on 07/24/2019   Between 7am to 6pm - Pager - [phone]  After 6pm go to [email or web address] - password [email or web address] [facility]  Office  [phone]  CC: Primary care physician; System, Pcp Not In   Note: This dictation was prepared with Dragon dictation along with smaller phrase technology. Any transcriptional errors that result from this  process are unintentional.

## 2019-07-24 NOTE — ED Triage Notes (Signed)
Patient presents to the ED with tongue and lip swelling that started yesterday.  Patient states he takes lisinopril for blood pressure.

## 2019-07-24 NOTE — Progress Notes (Signed)
Family Meeting Note  Advance Directive:yes  Today a meeting took place with the Patient and fiance.   The following clinical team members were present during this meeting:MD  The following were discussed:[name] diagnosis: HIV, Hep C, Htn, Angioedema , [name] progosis: Unable to determine and Goals for treatment: Full Code  Additional follow-up to be provided: PMD  Time spent during discussion:20 minutes  Vaughan Basta, MD

## 2019-07-24 NOTE — Telephone Encounter (Signed)
Pt aware covid lab test negative, not detected °

## 2019-07-24 NOTE — ED Notes (Signed)
ED TO INPATIENT HANDOFF REPORT  ED Nurse Name and Phone #: Maralyn SagoSarah & Victorino DikeJennifer [phone]  S Name/Age/Gender Roy Registeranny Kay Buitron Sr. 53 y.o. male Room/Bed: ED24A/ED24A  Code Status   Code Status: Prior  Home/SNF/Other Home Patient oriented to: self, place, time and situation Is this baseline? Yes   Triage Complete: Triage complete  Chief Complaint Throat swelling   Triage Note Patient presents to the ED with tongue and lip swelling that started yesterday.  Patient states he takes lisinopril for blood pressure.     Allergies Allergies  Allergen Reactions  . Lactose Other (See Comments)    GI distress  . Lisinopril Swelling  . Pollen Extract Itching    Level of Care/Admitting Diagnosis ED Disposition    ED Disposition Condition Comment   Admit  Hospital Area: [facility] [[id number]]  Level of Care: Med-Surg [16]  Covid Evaluation: Asymptomatic Screening Protocol (No Symptoms)  Diagnosis: Angioedema [161096][[id number]]  Admitting Physician: Altamese DillingVACHHANI, VAIBHAVKUMAR [[id number]]  Attending Physician: Altamese DillingVACHHANI, VAIBHAVKUMAR [phone][[id number]]  PT Class (Do Not Modify): Observation [104]  PT Acc Code (Do Not Modify): Observation [10022]       B Medical/Surgery History Past Medical History:  Diagnosis Date  . AIDS (acquired immune deficiency syndrome) (HCC)   . Arthritis   . Asthma   . Bipolar disorder (HCC)   . Bronchitis   . Depression   . GERD (gastroesophageal reflux disease)   . Hepatitis C   . HIV (human immunodeficiency virus infection) (HCC)   . HTN (hypertension)    Past Surgical History:  Procedure Laterality Date  . HERNIA REPAIR    . TOE SURGERY       A IV Location/Drains/Wounds Patient Lines/Drains/Airways Status   Active Line/Drains/Airways    Name:   Placement date:   Placement time:   Site:   Days:   Peripheral IV 07/24/19 Left;Lateral Antecubital   07/24/19    1002    Antecubital   less than 1          Intake/Output Last 24 hours No intake  or output data in the 24 hours ending 07/24/19 1653  Labs/Imaging Results for orders placed or performed during the hospital encounter of 07/24/19 (from the past 48 hour(s))  CBC with Differential     Status: None   Collection Time: 07/24/19  1:55 PM  Result Value Ref Range   WBC 4.4 4.0 - 10.5 K/uL   RBC 4.88 4.22 - 5.81 MIL/uL   Hemoglobin 16.3 13.0 - 17.0 g/dL   HCT 29.546.0 62.139.0 - 30.852.0 %   MCV 94.3 80.0 - 100.0 fL   MCH 33.4 26.0 - 34.0 pg   MCHC 35.4 30.0 - 36.0 g/dL   RDW 65.711.9 84.611.5 - 96.215.5 %   Platelets 214 150 - 400 K/uL   nRBC 0.0 0.0 - 0.2 %   Neutrophils Relative % 80 %   Neutro Abs 3.6 1.7 - 7.7 K/uL   Lymphocytes Relative 18 %   Lymphs Abs 0.8 0.7 - 4.0 K/uL   Monocytes Relative 1 %   Monocytes Absolute 0.1 0.1 - 1.0 K/uL   Eosinophils Relative 0 %   Eosinophils Absolute 0.0 0.0 - 0.5 K/uL   Basophils Relative 1 %   Basophils Absolute 0.0 0.0 - 0.1 K/uL   Immature Granulocytes 0 %   Abs Immature Granulocytes 0.01 0.00 - 0.07 K/uL    Comment: Performed at [facility], [address] [place], [place] [address]  Comprehensive metabolic panel  Status: Abnormal   Collection Time: 07/24/19  1:55 PM  Result Value Ref Range   Sodium 132 (L) 135 - 145 mmol/L   Potassium 4.6 3.5 - 5.1 mmol/L   Chloride 103 98 - 111 mmol/L   CO2 21 (L) 22 - 32 mmol/L   Glucose, Bld 144 (H) 70 - 99 mg/dL   BUN 32 (H) 6 - 20 mg/dL   Creatinine, Ser 0.01 (H) 0.61 - 1.24 mg/dL   Calcium 8.9 8.9 - 74.9 mg/dL   Total Protein 8.1 6.5 - 8.1 g/dL   Albumin 4.2 3.5 - 5.0 g/dL   AST 25 15 - 41 U/L   ALT 26 0 - 44 U/L   Alkaline Phosphatase 74 38 - 126 U/L   Total Bilirubin 1.2 0.3 - 1.2 mg/dL   GFR calc non Af Amer 55 (L) >60 mL/min   GFR calc Af Amer >60 >60 mL/min   Anion gap 8 5 - 15    Comment: Performed at [facility], [address] [place], [place] [address]  SARS Coronavirus 2 [facility] order, Performed in [facility] hospital lab) Nasopharyngeal Nasopharyngeal  Swab     Status: None   Collection Time: 07/24/19  3:03 PM   Specimen: Nasopharyngeal Swab  Result Value Ref Range   SARS Coronavirus 2 NEGATIVE NEGATIVE    Comment: (NOTE) If result is NEGATIVE SARS-CoV-2 target nucleic acids are NOT DETECTED. The SARS-CoV-2 RNA is generally detectable in upper and lower  respiratory specimens during the acute phase of infection. The lowest  concentration of SARS-CoV-2 viral copies this assay can detect is 250  copies / mL. A negative result does not preclude SARS-CoV-2 infection  and should not be used as the sole basis for treatment or other  patient management decisions.  A negative result may occur with  improper specimen collection / handling, submission of specimen other  than nasopharyngeal swab, presence of viral mutation(s) within the  areas targeted by this assay, and inadequate number of viral copies  (<250 copies / mL). A negative result must be combined with clinical  observations, patient history, and epidemiological information. If result is POSITIVE SARS-CoV-2 target nucleic acids are DETECTED. The SARS-CoV-2 RNA is generally detectable in upper and lower  respiratory specimens dur ing the acute phase of infection.  Positive  results are indicative of active infection with SARS-CoV-2.  Clinical  correlation with patient history and other diagnostic information is  necessary to determine patient infection status.  Positive results do  not rule out bacterial infection or co-infection with other viruses. If result is PRESUMPTIVE POSTIVE SARS-CoV-2 nucleic acids MAY BE PRESENT.   A presumptive positive result was obtained on the submitted specimen  and confirmed on repeat testing.  While 2019 novel coronavirus  (SARS-CoV-2) nucleic acids may be present in the submitted sample  additional confirmatory testing may be necessary for epidemiological  and / or clinical management purposes  to differentiate between  SARS-CoV-2 and other  Sarbecovirus currently known to infect humans.  If clinically indicated additional testing with an alternate test  methodology [phone]) is advised. The SARS-CoV-2 RNA is generally  detectable in upper and lower respiratory sp ecimens during the acute  phase of infection. The expected result is Negative. Fact Sheet for Patients:  BoilerBrush.com.cy Fact Sheet for Healthcare Providers: https://pope.com/ This test is not yet approved or cleared by the [place] FDA and has been authorized for detection and/or diagnosis of SARS-CoV-2 by FDA under an Emergency Use Authorization (  EUA).  This EUA will remain in effect (meaning this test can be used) for the duration of the COVID-19 declaration under Section 564(b)(1) of the Act, 21 U.S.C. section 360bbb-3(b)(1), unless the authorization is terminated or revoked sooner. Performed at [place] [facility], [address] [place], [place] [address]    Dg Neck Soft Tissue  Result Date: 07/24/2019 CLINICAL DATA:  Tongue and throat swelling since last night, pain with swallowing. EXAM: NECK SOFT TISSUES - 1+ VIEW COMPARISON:  Radiographs of the cervical spine 08/22/2013 FINDINGS: There is no evidence of retropharyngeal soft tissue swelling or epiglottic enlargement. Question submandibular/floor of mouth soft tissue swelling. The cervical airway is unremarkable and no radio-opaque foreign body identified. Degenerative change of the cervical spine with multilevel ventral osteophytes. IMPRESSION: No evidence of retropharyngeal soft tissue swelling or epiglottic enlargement. Question submandibular/floor of mouth soft tissue swelling. Electronically Signed   By: Kellie Simmering   On: 07/24/2019 09:41    Pending Labs [employer] (From admission, onward)    Start     Ordered   Signed and Occupational hygienist morning,   R     Signed and Held   Signed and Held  CBC  Tomorrow morning,   R      Signed and Held   Signed and Held  CBC  (heparin)  Once,   R    Comments: Baseline for heparin therapy IF NOT ALREADY DRAWN.  Notify MD if PLT < 100 K.    Signed and Held   Signed and Held  Creatinine, serum  (heparin)  Once,   R    Comments: Baseline for heparin therapy IF NOT ALREADY DRAWN.    Signed and Held          Vitals/Pain Today's Vitals   07/24/19 1354 07/24/19 1400 07/24/19 1500 07/24/19 1616  BP: (!) 104/58 111/83 103/68 117/79  Pulse: 61 65 65 65  Resp: 16  18 16   Temp:    98.6 F (37 C)  TempSrc:    Oral  SpO2: 95% 99% 96% 97%  Weight:      Height:      PainSc: 0-No pain   0-No pain    Isolation Precautions No active isolations  Medications Medications  diphenhydrAMINE (BENADRYL) injection 25 mg (25 mg Intravenous Given 07/24/19 1619)  famotidine (PEPCID) IVPB 20 mg premix (has no administration in time range)  methylPREDNISolone sodium succinate (SOLU-MEDROL) 125 mg/2 mL injection 60 mg (60 mg Intravenous Given 07/24/19 1618)  methylPREDNISolone sodium succinate (SOLU-MEDROL) 125 mg/2 mL injection 125 mg (125 mg Intravenous Given 07/24/19 1003)  diphenhydrAMINE (BENADRYL) injection 50 mg (50 mg Intravenous Given 07/24/19 1003)  famotidine (PEPCID) IVPB 20 mg premix (0 mg Intravenous Stopped 07/24/19 1035)    Mobility walks Low fall risk   Focused Assessments Pulmonary Assessment Handoff:  Lung sounds:   O2 Device: Room Air     ,   R Recommendations: See Admitting Provider Note  Report given to:   Additional Notes:

## 2019-07-25 LAB — BASIC METABOLIC PANEL
Anion gap: 6 (ref 5–15)
BUN: 33 mg/dL — ABNORMAL HIGH (ref 6–20)
CO2: 23 mmol/L (ref 22–32)
Calcium: 9.3 mg/dL (ref 8.9–10.3)
Chloride: 107 mmol/L (ref 98–111)
Creatinine, Ser: 1.41 mg/dL — ABNORMAL HIGH (ref 0.61–1.24)
GFR calc Af Amer: >60 mL/min (ref >60)
GFR calc non Af Amer: 56 mL/min — ABNORMAL LOW (ref >60)
Glucose, Bld: 135 mg/dL — ABNORMAL HIGH (ref 70–99)
Potassium: 4.8 mmol/L (ref 3.5–5.1)
Sodium: 136 mmol/L (ref 135–145)

## 2019-07-25 LAB — CBC
HCT: 45.2 % (ref 39.0–52.0)
Hemoglobin: 15.7 g/dL (ref 13.0–17.0)
MCH: 33.1 pg (ref 26.0–34.0)
MCHC: 34.7 g/dL (ref 30.0–36.0)
MCV: 95.4 fL (ref 80.0–100.0)
Platelets: 219 10*3/uL (ref 150–400)
RBC: 4.74 MIL/uL (ref 4.22–5.81)
RDW: 11.9 % (ref 11.5–15.5)
WBC: 17 10*3/uL — ABNORMAL HIGH (ref 4.0–10.5)
nRBC: 0 % (ref 0.0–0.2)

## 2019-07-25 MED ORDER — PREDNISONE 10 MG PO TABS: ORAL_TABLET | ORAL | 0 refills | Status: DC

## 2019-07-25 MED ORDER — AMLODIPINE BESYLATE 10 MG PO TABS: 10.0000 mg | ORAL_TABLET | Freq: Every day | ORAL | Status: DC

## 2019-07-25 NOTE — Progress Notes (Signed)
Pt is being discharged home.  Discharged papers given and explained to pt. Pt verbalized understanding. Meds and f/up appointments reviewed. Rx given.

## 2019-07-25 NOTE — Discharge Summary (Signed)
[facility] [place] at [facility]   PATIENT NAME: Roy [name]    MR#:  [id number]  DATE OF BIRTH:  1966/04/13  DATE OF ADMISSION:  07/24/2019 ADMITTING PHYSICIAN: Altamese Dilling, MD  DATE OF DISCHARGE: 07/25/2019  1:01 PM  PRIMARY CARE PHYSICIAN: System, Pcp Not In    ADMISSION DIAGNOSIS:  Angioedema, initial encounter [T78.3XXA] HIV infection, unspecified symptom status (HCC) [B20]  DISCHARGE DIAGNOSIS:  Principal Problem:   Angioedema   SECONDARY DIAGNOSIS:   Past Medical History:  Diagnosis Date  . AIDS (acquired immune deficiency syndrome) (HCC)   . Arthritis   . Asthma   . Bipolar disorder (HCC)   . Bronchitis   . Depression   . GERD (gastroesophageal reflux disease)   . Hepatitis C   . HIV (human immunodeficiency virus infection) (HCC)   . HTN (hypertension)     HOSPITAL COURSE:   53 year old male with past medical history of HIV, bipolar disorder, hypertension, hepatitis C, GERD who presented to the hospital due to tongue and lip swelling and suspected angioedema.  1.  Angioedema- secondary to ACE inhibitor's.  Patient was on lisinopril which she has been taking for a while but developed the symptoms acutely. - He was admitted to the hospital placed on IV steroids, Benadryl as needed and IV Pepcid. - [name] tongue and lip swelling has significantly improved, he is hemodynamically stable, he has no airway compromise.  He will be discharged home on oral prednisone taper.  He was advised to stop taking his lisinopril.  2.  Essential hypertension- patient was taken off his lisinopril given his angioedema, he has already been started on higher dose of Norvasc which he will continue and further changes can be made by his primary care physician.  3.  History of HIV-patient will continue his HAART as stated below.  4.  Neuropathy-patient will continue his gabapentin.  5.  Depression-patient will continue Celexa  6. Leukocytosis - due  to steroids and can be further followed as outpatient.   DISCHARGE CONDITIONS:   Stable.   CONSULTS OBTAINED:    DRUG ALLERGIES:   Allergies  Allergen Reactions  . Lactose Other (See Comments)    GI distress  . Lisinopril Swelling  . Pollen Extract Itching    DISCHARGE MEDICATIONS:   Allergies as of 07/25/2019      Reactions   Lactose Other (See Comments)   GI distress   Lisinopril Swelling   Pollen Extract Itching      Medication List    STOP taking these medications   traMADol 50 MG tablet Commonly known as: ULTRAM     TAKE these medications   albuterol 108 (90 Base) MCG/ACT inhaler Commonly known as: VENTOLIN HFA Inhale 1-2 puffs into the lungs every 4 (four) hours as needed for wheezing or shortness of breath.   amLODipine 10 MG tablet Commonly known as: NORVASC Take 1 tablet (10 mg total) by mouth daily. What changed:   additional instructions  Another medication with the same name was removed. Continue taking this medication, and follow the directions you see here.   atorvastatin 10 MG tablet Commonly known as: LIPITOR Take 1 tablet (10 mg total) by mouth daily at 6 PM. For high cholesterol   bictegravir-emtricitabine-tenofovir AF 50-200-25 MG Tabs tablet Commonly known as: Biktarvy Take 1 tablet by mouth daily. For HIV   citalopram 40 MG tablet Commonly known as: CELEXA Take 1 tablet (40 mg total) by mouth daily. For mood   dolutegravir 50  MG tablet Commonly known as: TIVICAY Take 50 mg by mouth daily.   gabapentin 300 MG capsule Commonly known as: NEURONTIN Take 1 capsule (300 mg total) by mouth 2 (two) times daily.   lactase 3000 units tablet Commonly known as: LACTAID Take 1 tablet (3,000 Units total) by mouth 3 (three) times daily with meals.   predniSONE 10 MG tablet Commonly known as: DELTASONE Label  & dispense according to the schedule below. 5 Pills PO for 1 day then, 4 Pills PO for 1 day, 3 Pills PO for 1 day, 2 Pills PO for 1  day, 1 Pill PO for 1 days then STOP.   traZODone 150 MG tablet Commonly known as: DESYREL Take 1 tablet (150 mg total) by mouth at bedtime as needed for sleep. What changed: additional instructions         DISCHARGE INSTRUCTIONS:   DIET:  Cardiac diet  DISCHARGE CONDITION:  Stable  ACTIVITY:  Activity as tolerated  OXYGEN:  Home Oxygen: No.   Oxygen Delivery: room air  DISCHARGE LOCATION:  home   If you experience worsening of your admission symptoms, develop shortness of breath, life threatening emergency, suicidal or homicidal thoughts you must seek medical attention immediately by calling 911 or calling your MD immediately  if symptoms less severe.  You Must read complete instructions/literature along with all the possible adverse reactions/side effects for all the Medicines you take and that have been prescribed to you. Take any new Medicines after you have completely understood and accpet all the possible adverse reactions/side effects.   Please note  You were cared for by a hospitalist during your hospital stay. If you have any questions about your discharge medications or the care you received while you were in the hospital after you are discharged, you can call the unit and asked to speak with the hospitalist on call if the hospitalist that took care of you is not available. Once you are discharged, your primary care physician will handle any further medical issues. Please note that NO REFILLS for any discharge medications will be authorized once you are discharged, as it is imperative that you return to your primary care physician (or establish a relationship with a primary care physician if you do not have one) for your aftercare needs so that they can reassess your need for medications and monitor your lab values.     Today   Tongue swelling/lip swelling much improved. Hemodynamically stable.  No other complaints tolerating PO well.   VITAL SIGNS:  Blood  pressure (!) 145/94, pulse 65, temperature 97.8 F (36.6 C), temperature source Oral, resp. rate 18, height 5\' 9"  (1.753 m), weight 104.3 kg, SpO2 97 %.  I/O:    Intake/Output Summary (Last 24 hours) at 07/25/2019 1640 Last data filed at 07/25/2019 0500 Gross per 24 hour  Intake 0 ml  Output 200 ml  Net -200 ml    PHYSICAL EXAMINATION:   GENERAL:  53 y.o.-year-old patient lying in the bed with no acute distress.  EYES: Pupils equal, round, reactive to light and accommodation. No scleral icterus. Extraocular muscles intact.  HEENT: Head atraumatic, normocephalic. Oropharynx and nasopharynx clear.  NECK:  Supple, no jugular venous distention. No thyroid enlargement, no tenderness.  LUNGS: Normal breath sounds bilaterally, no wheezing, rales,rhonchi. No use of accessory muscles of respiration.  CARDIOVASCULAR: S1, S2 normal. No murmurs, rubs, or gallops.  ABDOMEN: Soft, non-tender, non-distended. Bowel sounds present. No organomegaly or mass.  EXTREMITIES: No pedal edema, cyanosis, or  clubbing.  NEUROLOGIC: Cranial nerves II through XII are intact. No focal motor or sensory defecits b/l.  PSYCHIATRIC: The patient is alert and oriented x 3.  SKIN: No obvious rash, lesion, or ulcer.   DATA REVIEW:   CBC Recent Labs  Lab 07/25/19 0416  WBC 17.0*  HGB 15.7  HCT 45.2  PLT 219    Chemistries  Recent Labs  Lab 07/24/19 1355 07/25/19 0416  NA 132* 136  K 4.6 4.8  CL 103 107  CO2 21* 23  GLUCOSE 144* 135*  BUN 32* 33*  CREATININE 1.43* 1.41*  CALCIUM 8.9 9.3  AST 25  --   ALT 26  --   ALKPHOS 74  --   BILITOT 1.2  --     Cardiac Enzymes No results for input(s): TROPONINI in the last 168 hours.  Microbiology Results  Results for orders placed or performed during the hospital encounter of 07/24/19  SARS Coronavirus 2 [facility] order, Performed in [facility] hospital lab) Nasopharyngeal Nasopharyngeal Swab     Status: None   Collection Time: 07/24/19  3:03 PM    Specimen: Nasopharyngeal Swab  Result Value Ref Range Status   SARS Coronavirus 2 NEGATIVE NEGATIVE Final    Comment: (NOTE) If result is NEGATIVE SARS-CoV-2 target nucleic acids are NOT DETECTED. The SARS-CoV-2 RNA is generally detectable in upper and lower  respiratory specimens during the acute phase of infection. The lowest  concentration of SARS-CoV-2 viral copies this assay can detect is 250  copies / mL. A negative result does not preclude SARS-CoV-2 infection  and should not be used as the sole basis for treatment or other  patient management decisions.  A negative result may occur with  improper specimen collection / handling, submission of specimen other  than nasopharyngeal swab, presence of viral mutation(s) within the  areas targeted by this assay, and inadequate number of viral copies  (<250 copies / mL). A negative result must be combined with clinical  observations, patient history, and epidemiological information. If result is POSITIVE SARS-CoV-2 target nucleic acids are DETECTED. The SARS-CoV-2 RNA is generally detectable in upper and lower  respiratory specimens dur ing the acute phase of infection.  Positive  results are indicative of active infection with SARS-CoV-2.  Clinical  correlation with patient history and other diagnostic information is  necessary to determine patient infection status.  Positive results do  not rule out bacterial infection or co-infection with other viruses. If result is PRESUMPTIVE POSTIVE SARS-CoV-2 nucleic acids MAY BE PRESENT.   A presumptive positive result was obtained on the submitted specimen  and confirmed on repeat testing.  While 2019 novel coronavirus  (SARS-CoV-2) nucleic acids may be present in the submitted sample  additional confirmatory testing may be necessary for epidemiological  and / or clinical management purposes  to differentiate between  SARS-CoV-2 and other Sarbecovirus currently known to infect humans.  If  clinically indicated additional testing with an alternate test  methodology [id number]) is advised. The SARS-CoV-2 RNA is generally  detectable in upper and lower respiratory sp ecimens during the acute  phase of infection. The expected result is Negative. Fact Sheet for Patients:  StrictlyIdeas.no Fact Sheet for Healthcare Providers: BankingDealers.co.za This test is not yet approved or cleared by the [place] FDA and has been authorized for detection and/or diagnosis of SARS-CoV-2 by FDA under an Emergency Use Authorization (EUA).  This EUA will remain in effect (meaning this test can be used) for the duration of the  COVID-19 declaration under Section 564(b)(1) of the Act, 21 U.S.C. section 360bbb-3(b)(1), unless the authorization is terminated or revoked sooner. Performed at [facility], [address] [place], [place] [address]     RADIOLOGY:  Dg Neck Soft Tissue  Result Date: 07/24/2019 CLINICAL DATA:  Tongue and throat swelling since last night, pain with swallowing. EXAM: NECK SOFT TISSUES - 1+ VIEW COMPARISON:  Radiographs of the cervical spine 08/22/2013 FINDINGS: There is no evidence of retropharyngeal soft tissue swelling or epiglottic enlargement. Question submandibular/floor of mouth soft tissue swelling. The cervical airway is unremarkable and no radio-opaque foreign body identified. Degenerative change of the cervical spine with multilevel ventral osteophytes. IMPRESSION: No evidence of retropharyngeal soft tissue swelling or epiglottic enlargement. Question submandibular/floor of mouth soft tissue swelling. Electronically Signed   By: Jackey LogeKyle  Golden   On: 07/24/2019 09:41      Management plans discussed with the patient, family and they are in agreement.  CODE STATUS:  Code Status History    Date Active Date Inactive Code Status Order ID Comments User Context   07/24/2019 1750 07/25/2019 1603 Full Code [id number]   Altamese DillingVachhani, Vaibhavkumar, MD Inpatient   TOTAL TIME TAKING CARE OF THIS PATIENT: 40 minutes.    Houston SirenVivek J  M.D on 07/25/2019 at 4:40 PM  Between 7am to 6pm - Pager - [phone]  After 6pm go to [email or web address] - Social research officer, governmentpassword [email or web address]  [facility] [place] [facility]  Office  [phone]  CC: Primary care physician; System, Pcp Not In

## 2019-07-25 NOTE — Plan of Care (Signed)

## 2019-08-03 ENCOUNTER — Inpatient Hospital Stay: Admit: 2019-08-03 | Payer: Medicaid Other

## 2019-08-04 ENCOUNTER — Institutional Professional Consult (permissible substitution): Admit: 2019-08-04 | Discharge: 2019-08-05 | Payer: MEDICAID | Attending: Registered" | Primary: Registered"

## 2019-08-07 ENCOUNTER — Other Ambulatory Visit: Admit: 2019-08-07 | Payer: Medicaid Other

## 2019-08-11 ENCOUNTER — Other Ambulatory Visit: Payer: Self-pay

## 2019-08-11 DIAGNOSIS — Z20822 Contact with and (suspected) exposure to covid-19: Secondary | ICD-10-CM

## 2019-08-12 LAB — NOVEL CORONAVIRUS, NAA: SARS-CoV-2, NAA: NOT DETECTED

## 2019-08-18 ENCOUNTER — Institutional Professional Consult (permissible substitution): Admit: 2019-08-18 | Discharge: 2019-08-19 | Payer: MEDICAID | Attending: Registered" | Primary: Registered"

## 2019-08-19 ENCOUNTER — Institutional Professional Consult (permissible substitution): Admit: 2019-08-19 | Discharge: 2019-08-20 | Payer: MEDICAID

## 2019-08-25 ENCOUNTER — Inpatient Hospital Stay: Admission: RE | Admit: 2019-08-25 | Payer: Medicaid Other | Source: Ambulatory Visit

## 2019-08-26 ENCOUNTER — Institutional Professional Consult (permissible substitution): Admit: 2019-08-26 | Discharge: 2019-08-27 | Payer: MEDICAID

## 2019-08-26 DIAGNOSIS — Z21 Asymptomatic human immunodeficiency virus [HIV] infection status: Secondary | ICD-10-CM

## 2019-08-26 DIAGNOSIS — M1611 Unilateral primary osteoarthritis, right hip: Secondary | ICD-10-CM

## 2019-08-26 DIAGNOSIS — B20 Human immunodeficiency virus [HIV] disease: Secondary | ICD-10-CM

## 2019-08-26 DIAGNOSIS — E669 Obesity, unspecified: Secondary | ICD-10-CM

## 2019-08-27 ENCOUNTER — Other Ambulatory Visit: Payer: Self-pay

## 2019-08-27 ENCOUNTER — Encounter
Admission: RE | Admit: 2019-08-27 | Discharge: 2019-08-27 | Disposition: A | Discharge status: Home / Self Care | Payer: Medicaid Other | Source: Ambulatory Visit | Attending: Orthopedic Surgery | Admitting: Orthopedic Surgery

## 2019-08-27 DIAGNOSIS — Z01818 Encounter for other preprocedural examination: Secondary | ICD-10-CM | POA: Diagnosis present

## 2019-08-27 HISTORY — DX: Other specified postprocedural states: Z98.890

## 2019-08-27 HISTORY — DX: Other complications of anesthesia, initial encounter: T88.59XA

## 2019-08-27 HISTORY — DX: Cardiac arrhythmia, unspecified: I49.9

## 2019-08-27 HISTORY — DX: Nausea with vomiting, unspecified: R11.2

## 2019-08-27 LAB — CBC
HCT: 43.9 % (ref 39.0–52.0)
Hemoglobin: 15 g/dL (ref 13.0–17.0)
MCH: 33.3 pg (ref 26.0–34.0)
MCHC: 34.2 g/dL (ref 30.0–36.0)
MCV: 97.3 fL (ref 80.0–100.0)
Platelets: 224 10*3/uL (ref 150–400)
RBC: 4.51 MIL/uL (ref 4.22–5.81)
RDW: 12.3 % (ref 11.5–15.5)
WBC: 4.8 10*3/uL (ref 4.0–10.5)
nRBC: 0 % (ref 0.0–0.2)

## 2019-08-27 LAB — TYPE AND SCREEN
ABO/RH(D): O POS
Antibody Screen: NEGATIVE

## 2019-08-27 LAB — URINALYSIS, ROUTINE W REFLEX MICROSCOPIC
Bacteria, UA: NONE SEEN
Bilirubin Urine: NEGATIVE
Glucose, UA: NEGATIVE mg/dL
Ketones, ur: NEGATIVE mg/dL
Leukocytes,Ua: NEGATIVE
Nitrite: NEGATIVE
Protein, ur: NEGATIVE mg/dL
Specific Gravity, Urine: 1.017 (ref 1.005–1.030)
pH: 5 (ref 5.0–8.0)

## 2019-08-27 LAB — SURGICAL PCR SCREEN
MRSA, PCR: NEGATIVE
Staphylococcus aureus: POSITIVE — AB

## 2019-08-27 LAB — BASIC METABOLIC PANEL
Anion gap: 8 (ref 5–15)
BUN: 16 mg/dL (ref 6–20)
CO2: 25 mmol/L (ref 22–32)
Calcium: 9.2 mg/dL (ref 8.9–10.3)
Chloride: 105 mmol/L (ref 98–111)
Creatinine, Ser: 1.39 mg/dL — ABNORMAL HIGH (ref 0.61–1.24)
GFR calc Af Amer: >60 mL/min (ref >60)
GFR calc non Af Amer: 57 mL/min — ABNORMAL LOW (ref >60)
Glucose, Bld: 101 mg/dL — ABNORMAL HIGH (ref 70–99)
Potassium: 4.2 mmol/L (ref 3.5–5.1)
Sodium: 138 mmol/L (ref 135–145)

## 2019-08-27 LAB — APTT: aPTT: 31 seconds (ref 24–36)

## 2019-08-27 LAB — PROTIME-INR
INR: 1 (ref 0.8–1.2)
Prothrombin Time: 13.5 seconds (ref 11.4–15.2)

## 2019-08-27 LAB — SEDIMENTATION RATE: Sed Rate: 4 mm/hr (ref 0–20)

## 2019-08-27 NOTE — Patient Instructions (Signed)
Your procedure is scheduled on: Tues 9/15 Report to Day Surgery. To find out your arrival time please call [phone] between 1PM - 3PM on Mon 9/14.  Remember: Instructions that are not followed completely may result in serious medical risk,  up to and including death, or upon the discretion of your surgeon and anesthesiologist your  surgery may need to be rescheduled.     _X__ 1. Do not eat food after midnight the night before your procedure.                 No gum chewing or hard candies. You may drink clear liquids up to 2 hours                 before you are scheduled to arrive for your surgery- DO not drink clear                 liquids within 2 hours of the start of your surgery.                 Clear Liquids include:  water, apple juice without pulp, clear carbohydrate                 drink such as Clearfast of Gatorade, Black Coffee or Tea (Do not add                 anything to coffee or tea).  __X__2.  On the morning of surgery brush your teeth with toothpaste and water, you                may rinse your mouth with mouthwash if you wish.  Do not swallow any toothpaste of mouthwash.     _X__ 3.  No Alcohol for 24 hours before or after surgery.   _X__ 4.  Do Not Smoke or use e-cigarettes For 24 Hours Prior to Your Surgery.                 Do not use any chewable tobacco products for at least 6 hours prior to                 surgery.  ____  5.  Bring all medications with you on the day of surgery if instructed.   _x___  6.  Notify your doctor if there is any change in your medical condition      (cold, fever, infections).     Do not wear jewelry, make-up, hairpins, clips or nail polish. Do not wear lotions, powders, or perfumes. You may wear deodorant. Do not shave 48 hours prior to surgery. Men may shave face and neck. Do not bring valuables to the hospital.    [facility] is not responsible for any belongings or valuables.  Contacts, dentures  or bridgework may not be worn into surgery. Leave your suitcase in the car. After surgery it may be brought to your room. For patients admitted to the hospital, discharge time is determined by your treatment team.   Patients discharged the day of surgery will not be allowed to drive home.   Please read over the following fact sheets that you were given:     __x__ Take these medicines the morning of surgery with A SIP OF WATER:    1. bictegravir-emtricitabine-tenofovir AF (BIKTARVY) 50-200-25 MG TABS tablet  2. citalopram (CELEXA) 40 MG tablet  3. gabapentin (NEURONTIN) 300 MG capsule  4.  No BP med due to allergy  5.  6.  ____ Fleet Enema (as directed)   __x__ Use CHG Soap as directed x ____ Use inhalers on the day of surgeryalbuterol (PROVENTIL HFA;VENTOLIN HFA) 108 (90 Base) MCG/ACT inhaler  And bring with you  ____ Stop metformin 2 days prior to surgery    ____ Take 1/2 of usual insulin dose the night before surgery. No insulin the morning          of surgery.   ____ Stop Coumadin/Plavix/aspirin  ____ Stop Anti-inflammatories No Ibuprofen  Aleve     May take tylenol   ____ Stop supplements until after surgery.    ____ Bring C-Pap to the hospital.

## 2019-08-27 NOTE — Pre-Procedure Instructions (Signed)
Abnormal EKG, Dr Andree Elk notified.  Medical clearance requested.  Dr Rudene Christians office called and faxed regarding need for clearance.

## 2019-08-28 ENCOUNTER — Other Ambulatory Visit
Admission: RE | Admit: 2019-08-28 | Discharge: 2019-08-28 | Disposition: A | Discharge status: Home / Self Care | Payer: Medicaid Other | Source: Ambulatory Visit | Attending: Orthopedic Surgery | Admitting: Orthopedic Surgery

## 2019-08-28 DIAGNOSIS — Z01812 Encounter for preprocedural laboratory examination: Secondary | ICD-10-CM | POA: Diagnosis present

## 2019-08-28 DIAGNOSIS — Z20828 Contact with and (suspected) exposure to other viral communicable diseases: Secondary | ICD-10-CM | POA: Diagnosis not present

## 2019-08-29 LAB — URINE CULTURE
Culture: NO GROWTH
Special Requests: NORMAL

## 2019-08-29 LAB — SARS CORONAVIRUS 2 (TAT 6-24 HRS): SARS Coronavirus 2: NEGATIVE

## 2019-08-31 NOTE — H&P (Signed)
Chief Complaint  Patient presents with  . Right Hip Pain  Pain for a year. Gradually getting worse.   Roy [name] is a 53 y.o. male who presents today for evaluation of right hip pain. The patient reports that his right hip pain is been ongoing for the last year however worsening over the last several weeks. He denies any injury or trauma affecting the right hip. The patient did go to the emergency room for right hip discomfort and right leg pain, x-rays at the time demonstrated significant right hip osteoarthritic changes with complete loss of acetabular joint space. The patient is on antiviral treatment for HIV/AIDS, his most recent viral load was close to undetectable. He denies any numbness or tingling to the right lower extremity at today's appointment. He reports a significant amount of right groin pain and an aching, throbbing sensation. He does state that in the past he was taking Truvada and Atripla which he states he has been informed that this can cause bone degeneration but he is not sure of this was a specific cause of his right hip discomfort and pain. He denies any personal history of heart attack, stroke, asthma or COPD. No personal history of blood clots.  Past Medical History: Past Medical History:  Diagnosis Date  . Gastritis  . GERD (gastroesophageal reflux disease)  . Hepatitis C  . HIV (human immunodeficiency virus infection) (CMS-HCC)  . Hyperlipidemia  . Hypertension   Past Surgical History: Past Surgical History:  Procedure Laterality Date  . INGUINAL HERNIA REPAIR  . Left Foot Pinning  . Right Foot Surgery   Past Family History: Family History  Problem Relation Age of Onset  . Uterine cancer Mother  . Myocardial Infarction (Heart attack) Father  . Lung cancer Maternal Grandmother   Medications: Current Outpatient Medications Ordered in Epic  Medication Sig Dispense Refill  . albuterol 90 mcg/actuation inhaler Inhale 1 inhalation into the lungs  as needed  . amLODIPine (NORVASC) 10 MG tablet Take 10 mg by mouth once daily With a 5 mg tablet  . amLODIPine (NORVASC) 5 MG tablet Take by mouth Take 1 tablet (5 mg total) by mouth daily. For high blood pressure with a 20 mg tablet  . bictegravir-emtricitabine-tenofovir alafenamide (BIKTARVY) 50-200-25 mg tablet Take 1 tablet by mouth once daily  . citalopram (CELEXA) 40 MG tablet Take 1 tablet by mouth once daily  . dolutegravir (TIVICAY) 50 mg tablet Take 50 mg by mouth once daily  . gabapentin (NEURONTIN) 300 MG capsule Take 1 capsule by mouth once daily  . lactase (LACTAID) 3,000 unit tablet Take by mouth Take 3,000 Units by mouth as needed  . polyvinyl alcohol (LIQUITEARS) 1.4 % ophthalmic solution 1 drop as needed for dry eyes.  . traMADoL (ULTRAM) 50 mg tablet Take by mouth Take 1 tablet (50 mg total) by mouth every 8 (eight) hours as needed.  . traZODone (DESYREL) 150 MG tablet Take 1 tablet by mouth nightly as needed   No current Epic-ordered facility-administered medications on file.   Allergies: Allergies  Allergen Reactions  . Lactose Other (See Comments)  GI distress   . Bee Pollen Itching and Other (See Comments)    Review of Systems:  A comprehensive 14 point ROS was performed, reviewed by me today, and the pertinent orthopaedic findings are documented in the HPI.  Exam: BP 118/80  Ht 175.3 cm (5\' 9" )  Wt (!) 105 kg (231 lb 6.4 oz)  BMI 34.17 kg/m  General/Constitutional: The  patient appears to be well-nourished, well-developed, and in no acute distress. Neuro/Psych: Normal mood and affect, oriented to person, place and time. Eyes: Non-icteric. Pupils are equal, round, and reactive to light, and exhibit synchronous movement. ENT: Unremarkable. Lymphatic: No palpable adenopathy. Respiratory: Lungs clear to auscultation, Normal chest excursion, No wheezes and Non-labored breathing Cardiovascular: Regular rate and rhythm. No murmurs. and No edema, swelling or  tenderness, except as noted in detailed exam. Integumentary: No impressive skin lesions present, except as noted in detailed exam. Musculoskeletal: Unremarkable, except as noted in detailed exam.  General: Well developed, well nourished 53 y.o. male in no apparent distress. Normal affect. Normal communication. Patient answers questions appropriately. The patient has abnormal gait. There is slight antalgic component. There is no hip lurch.   Right Lower Extremities: Examination of the right lower extremities reveals no bony abnormality, no edema, and no ecchymosis. The patient has significant limitations with range of motion of the right hip. The patient has 10 degrees of external rotation to the right hip, at most 5 degrees of internal rotation, both of which provide moderate discomfort. Limited both abduction and abduction of the right hip. The patient has moderate anterior hip joint tenderness. The patient has a positive axial load test at the hip joint. The patient has moderate tenderness going from hip flexion into extension. The patient has a negative Patrick's test. The patient is non tender along the greater trochanter region. The patient has a negative Denna Haggard' test bilaterally. There is normal skin warmth. There is normal capillary refill bilaterally.   Neurologic: The patient has a negative straight leg raise. The patient has normal muscle strength testing for the quadriceps, calves, ankle dorsiflexion, ankle plantarflexion, and extensor hallicus longus. The patient has sensation that is intact to light touch. The deep tendon reflexes are normal at the patella and achilles. No clonus is noted.   Imaging: X-rays obtained in the emergency room are detailed in the HPI above.  Impression: Primary osteoarthritis of right hip [M16.11] Primary osteoarthritis of right hip (primary encounter diagnosis)  Plan:  THA through anterior approach.  The procedure was discussed with the patient, as  were the potential risks (including bleeding, infection, nerve and/or blood vessel injury, persistent or recurrent pain, leg length inequality, dislocation, need for further surgery, blood clots, strokes, heart attacks and/or arhythmias, pneumonia, etc.) and benefits. The patient states his understanding and wishes to proceed.

## 2019-09-01 ENCOUNTER — Inpatient Hospital Stay: Payer: Medicaid Other

## 2019-09-01 ENCOUNTER — Encounter
Admission: RE | Disposition: A | Discharge status: Home Health | Payer: Self-pay | Source: Home / Self Care | Attending: Orthopedic Surgery

## 2019-09-01 ENCOUNTER — Inpatient Hospital Stay: Payer: Medicaid Other | Admitting: Anesthesiology

## 2019-09-01 ENCOUNTER — Encounter: Payer: Self-pay | Admitting: *Deleted

## 2019-09-01 ENCOUNTER — Other Ambulatory Visit: Payer: Self-pay

## 2019-09-01 ENCOUNTER — Inpatient Hospital Stay
Admission: RE | Admit: 2019-09-01 | Discharge: 2019-09-03 | DRG: 470 | Disposition: A | Discharge status: Home Health | Payer: Medicaid Other | Attending: Orthopedic Surgery | Admitting: Orthopedic Surgery

## 2019-09-01 DIAGNOSIS — K219 Gastro-esophageal reflux disease without esophagitis: Secondary | ICD-10-CM | POA: Diagnosis present

## 2019-09-01 DIAGNOSIS — K08109 Complete loss of teeth, unspecified cause, unspecified class: Secondary | ICD-10-CM | POA: Diagnosis present

## 2019-09-01 DIAGNOSIS — Z79899 Other long term (current) drug therapy: Secondary | ICD-10-CM

## 2019-09-01 DIAGNOSIS — Z8619 Personal history of other infectious and parasitic diseases: Secondary | ICD-10-CM | POA: Diagnosis not present

## 2019-09-01 DIAGNOSIS — B20 Human immunodeficiency virus [HIV] disease: Secondary | ICD-10-CM | POA: Diagnosis present

## 2019-09-01 DIAGNOSIS — Z419 Encounter for procedure for purposes other than remedying health state, unspecified: Secondary | ICD-10-CM

## 2019-09-01 DIAGNOSIS — Z79891 Long term (current) use of opiate analgesic: Secondary | ICD-10-CM | POA: Diagnosis not present

## 2019-09-01 DIAGNOSIS — M1611 Unilateral primary osteoarthritis, right hip: Secondary | ICD-10-CM | POA: Diagnosis present

## 2019-09-01 DIAGNOSIS — G8918 Other acute postprocedural pain: Secondary | ICD-10-CM

## 2019-09-01 DIAGNOSIS — F319 Bipolar disorder, unspecified: Secondary | ICD-10-CM | POA: Diagnosis present

## 2019-09-01 DIAGNOSIS — Z96641 Presence of right artificial hip joint: Secondary | ICD-10-CM

## 2019-09-01 DIAGNOSIS — J45909 Unspecified asthma, uncomplicated: Secondary | ICD-10-CM | POA: Diagnosis present

## 2019-09-01 DIAGNOSIS — E785 Hyperlipidemia, unspecified: Secondary | ICD-10-CM | POA: Diagnosis present

## 2019-09-01 DIAGNOSIS — I10 Essential (primary) hypertension: Secondary | ICD-10-CM | POA: Diagnosis present

## 2019-09-01 DIAGNOSIS — Z888 Allergy status to other drugs, medicaments and biological substances status: Secondary | ICD-10-CM | POA: Diagnosis not present

## 2019-09-01 HISTORY — PX: TOTAL HIP ARTHROPLASTY: SHX124

## 2019-09-01 HISTORY — PX: APPLICATION OF WOUND VAC: SHX5189

## 2019-09-01 LAB — CBC
HCT: 41.2 % (ref 39.0–52.0)
Hemoglobin: 14 g/dL (ref 13.0–17.0)
MCH: 33.3 pg (ref 26.0–34.0)
MCHC: 34 g/dL (ref 30.0–36.0)
MCV: 97.9 fL (ref 80.0–100.0)
Platelets: 193 10*3/uL (ref 150–400)
RBC: 4.21 MIL/uL — ABNORMAL LOW (ref 4.22–5.81)
RDW: 11.7 % (ref 11.5–15.5)
WBC: 4.6 10*3/uL (ref 4.0–10.5)
nRBC: 0 % (ref 0.0–0.2)

## 2019-09-01 LAB — URINE DRUG SCREEN, QUALITATIVE (ARMC ONLY)
Amphetamines, Ur Screen: NOT DETECTED
Barbiturates, Ur Screen: NOT DETECTED
Benzodiazepine, Ur Scrn: NOT DETECTED
Cannabinoid 50 Ng, Ur ~~LOC~~: NOT DETECTED
Cocaine Metabolite,Ur ~~LOC~~: NOT DETECTED
MDMA (Ecstasy)Ur Screen: NOT DETECTED
Methadone Scn, Ur: NOT DETECTED
Opiate, Ur Screen: NOT DETECTED
Phencyclidine (PCP) Ur S: NOT DETECTED
Tricyclic, Ur Screen: NOT DETECTED

## 2019-09-01 LAB — CREATININE, SERUM
Creatinine, Ser: 1.29 mg/dL — ABNORMAL HIGH (ref 0.61–1.24)
GFR calc Af Amer: >60 mL/min (ref >60)
GFR calc non Af Amer: >60 mL/min (ref >60)

## 2019-09-01 LAB — ABO/RH: ABO/RH(D): O POS

## 2019-09-01 SURGERY — ARTHROPLASTY, HIP, TOTAL, ANTERIOR APPROACH
Anesthesia: Spinal | Site: Hip | Laterality: Right

## 2019-09-01 MED ORDER — FENTANYL CITRATE (PF) 100 MCG/2ML IJ SOLN
INTRAMUSCULAR | Status: DC | PRN
  Administered 2019-09-01 (×2): 50 ug via INTRAVENOUS

## 2019-09-01 MED ORDER — FAMOTIDINE 20 MG PO TABS
ORAL_TABLET | ORAL | Status: AC
  Administered 2019-09-01: 11:00:00 20 mg via ORAL
  Filled 2019-09-01: qty 1

## 2019-09-01 MED ORDER — NEOMYCIN-POLYMYXIN B GU 40-200000 IR SOLN
Status: DC | PRN
  Administered 2019-09-01 (×2): 4 mL

## 2019-09-01 MED ORDER — ALBUTEROL SULFATE (2.5 MG/3ML) 0.083% IN NEBU: 3.0000 mL | INHALATION_SOLUTION | RESPIRATORY_TRACT | Status: DC | PRN

## 2019-09-01 MED ORDER — OXYCODONE HCL 5 MG PO TABS
5.0000 mg | ORAL_TABLET | ORAL | Status: DC | PRN
  Administered 2019-09-01: 10 mg via ORAL
  Filled 2019-09-01 (×2): qty 2

## 2019-09-01 MED ORDER — SODIUM CHLORIDE 0.9 % IV SOLN
INTRAVENOUS | Status: DC
  Administered 2019-09-01 – 2019-09-02 (×3): via INTRAVENOUS

## 2019-09-01 MED ORDER — BUPIVACAINE-EPINEPHRINE 0.25% -1:200000 IJ SOLN
INTRAMUSCULAR | Status: DC | PRN
  Administered 2019-09-01: 30 mL

## 2019-09-01 MED ORDER — FAMOTIDINE 20 MG PO TABS
20.0000 mg | ORAL_TABLET | Freq: Once | ORAL | Status: AC
  Administered 2019-09-01: 11:00:00 20 mg via ORAL

## 2019-09-01 MED ORDER — BICTEGRAVIR-EMTRICITAB-TENOFOV 50-200-25 MG PO TABS
1.0000 | ORAL_TABLET | Freq: Every day | ORAL | Status: DC
  Administered 2019-09-02 – 2019-09-03 (×2): 1 via ORAL
  Filled 2019-09-01 (×2): qty 1

## 2019-09-01 MED ORDER — BISACODYL 5 MG PO TBEC: 5.0000 mg | DELAYED_RELEASE_TABLET | Freq: Every day | ORAL | Status: DC | PRN

## 2019-09-01 MED ORDER — GABAPENTIN 300 MG PO CAPS
300.0000 mg | ORAL_CAPSULE | Freq: Two times a day (BID) | ORAL | Status: DC
  Administered 2019-09-01 – 2019-09-03 (×4): 300 mg via ORAL
  Filled 2019-09-01 (×4): qty 1

## 2019-09-01 MED ORDER — CEFAZOLIN SODIUM-DEXTROSE 2-4 GM/100ML-% IV SOLN
2.0000 g | Freq: Once | INTRAVENOUS | Status: AC
  Administered 2019-09-01: 2 g via INTRAVENOUS

## 2019-09-01 MED ORDER — ONDANSETRON HCL 4 MG/2ML IJ SOLN
4.0000 mg | Freq: Four times a day (QID) | INTRAMUSCULAR | Status: DC | PRN
  Administered 2019-09-01: 4 mg via INTRAVENOUS
  Filled 2019-09-01: qty 2

## 2019-09-01 MED ORDER — PHENOL 1.4 % MT LIQD
1.0000 | OROMUCOSAL | Status: DC | PRN
  Filled 2019-09-01: qty 177

## 2019-09-01 MED ORDER — CITALOPRAM HYDROBROMIDE 20 MG PO TABS
40.0000 mg | ORAL_TABLET | Freq: Every day | ORAL | Status: DC
  Administered 2019-09-01 – 2019-09-03 (×3): 40 mg via ORAL
  Filled 2019-09-01 (×3): qty 2

## 2019-09-01 MED ORDER — EPHEDRINE SULFATE 50 MG/ML IJ SOLN
INTRAMUSCULAR | Status: DC | PRN
  Administered 2019-09-01: 10 mg via INTRAVENOUS

## 2019-09-01 MED ORDER — MAGNESIUM HYDROXIDE 400 MG/5ML PO SUSP
30.0000 mL | Freq: Every day | ORAL | Status: DC | PRN
  Administered 2019-09-03: 05:00:00 30 mL via ORAL
  Filled 2019-09-01: qty 30

## 2019-09-01 MED ORDER — MIDAZOLAM HCL 2 MG/2ML IJ SOLN
INTRAMUSCULAR | Status: AC
  Filled 2019-09-01: qty 2

## 2019-09-01 MED ORDER — ATORVASTATIN CALCIUM 10 MG PO TABS
10.0000 mg | ORAL_TABLET | Freq: Every day | ORAL | Status: DC
  Administered 2019-09-02: 10 mg via ORAL
  Filled 2019-09-01: qty 1

## 2019-09-01 MED ORDER — ZOLPIDEM TARTRATE 5 MG PO TABS
5.0000 mg | ORAL_TABLET | Freq: Every evening | ORAL | Status: DC | PRN
  Administered 2019-09-01 – 2019-09-02 (×2): 5 mg via ORAL
  Filled 2019-09-01 (×2): qty 1

## 2019-09-01 MED ORDER — ONDANSETRON HCL 4 MG PO TABS: 4.0000 mg | ORAL_TABLET | Freq: Four times a day (QID) | ORAL | Status: DC | PRN

## 2019-09-01 MED ORDER — OXYCODONE HCL 5 MG PO TABS
10.0000 mg | ORAL_TABLET | ORAL | Status: DC | PRN
  Administered 2019-09-02 – 2019-09-03 (×2): 10 mg via ORAL
  Filled 2019-09-01: qty 2

## 2019-09-01 MED ORDER — MENTHOL 3 MG MT LOZG
1.0000 | LOZENGE | OROMUCOSAL | Status: DC | PRN
  Filled 2019-09-01: qty 9

## 2019-09-01 MED ORDER — ACETAMINOPHEN 500 MG PO TABS
1000.0000 mg | ORAL_TABLET | Freq: Four times a day (QID) | ORAL | Status: AC
  Administered 2019-09-01 – 2019-09-02 (×4): 1000 mg via ORAL
  Filled 2019-09-01 (×4): qty 2

## 2019-09-01 MED ORDER — SODIUM CHLORIDE 0.9 % IV SOLN
INTRAVENOUS | Status: DC | PRN
  Administered 2019-09-01: 60 mL

## 2019-09-01 MED ORDER — FENTANYL CITRATE (PF) 100 MCG/2ML IJ SOLN: 25.0000 ug | INTRAMUSCULAR | Status: DC | PRN

## 2019-09-01 MED ORDER — MIDAZOLAM HCL 5 MG/5ML IJ SOLN
INTRAMUSCULAR | Status: DC | PRN
  Administered 2019-09-01: 2 mg via INTRAVENOUS

## 2019-09-01 MED ORDER — DOLUTEGRAVIR SODIUM 50 MG PO TABS
50.0000 mg | ORAL_TABLET | Freq: Every day | ORAL | Status: DC
  Administered 2019-09-01 – 2019-09-02 (×2): 50 mg via ORAL
  Filled 2019-09-01 (×2): qty 1

## 2019-09-01 MED ORDER — FENTANYL CITRATE (PF) 100 MCG/2ML IJ SOLN
INTRAMUSCULAR | Status: AC
  Filled 2019-09-01: qty 2

## 2019-09-01 MED ORDER — DIPHENHYDRAMINE HCL 12.5 MG/5ML PO ELIX: 12.5000 mg | ORAL_SOLUTION | ORAL | Status: DC | PRN

## 2019-09-01 MED ORDER — AMLODIPINE BESYLATE 10 MG PO TABS
10.0000 mg | ORAL_TABLET | Freq: Every day | ORAL | Status: DC
  Administered 2019-09-02 – 2019-09-03 (×2): 10 mg via ORAL
  Filled 2019-09-01 (×2): qty 1

## 2019-09-01 MED ORDER — TRAZODONE HCL 50 MG PO TABS: 150.0000 mg | ORAL_TABLET | Freq: Every evening | ORAL | Status: DC | PRN

## 2019-09-01 MED ORDER — METHOCARBAMOL 1000 MG/10ML IJ SOLN
500.0000 mg | Freq: Four times a day (QID) | INTRAVENOUS | Status: DC | PRN
  Filled 2019-09-01: qty 5

## 2019-09-01 MED ORDER — DOCUSATE SODIUM 100 MG PO CAPS
100.0000 mg | ORAL_CAPSULE | Freq: Two times a day (BID) | ORAL | Status: DC
  Administered 2019-09-01 – 2019-09-03 (×4): 100 mg via ORAL
  Filled 2019-09-01 (×4): qty 1

## 2019-09-01 MED ORDER — PROPOFOL 500 MG/50ML IV EMUL
INTRAVENOUS | Status: AC
  Filled 2019-09-01: qty 50

## 2019-09-01 MED ORDER — METOCLOPRAMIDE HCL 10 MG PO TABS: 5.0000 mg | ORAL_TABLET | Freq: Three times a day (TID) | ORAL | Status: DC | PRN

## 2019-09-01 MED ORDER — METOCLOPRAMIDE HCL 5 MG/ML IJ SOLN: 5.0000 mg | Freq: Three times a day (TID) | INTRAMUSCULAR | Status: DC | PRN

## 2019-09-01 MED ORDER — ALUM & MAG HYDROXIDE-SIMETH 200-200-20 MG/5ML PO SUSP
30.0000 mL | ORAL | Status: DC | PRN
  Administered 2019-09-02: 23:00:00 30 mL via ORAL
  Filled 2019-09-01: qty 30

## 2019-09-01 MED ORDER — METHOCARBAMOL 500 MG PO TABS: 500.0000 mg | ORAL_TABLET | Freq: Four times a day (QID) | ORAL | Status: DC | PRN

## 2019-09-01 MED ORDER — ONDANSETRON HCL 4 MG/2ML IJ SOLN: 4.0000 mg | Freq: Once | INTRAMUSCULAR | Status: DC | PRN

## 2019-09-01 MED ORDER — CEFAZOLIN SODIUM-DEXTROSE 2-4 GM/100ML-% IV SOLN
INTRAVENOUS | Status: AC
  Filled 2019-09-01: qty 100

## 2019-09-01 MED ORDER — ENOXAPARIN SODIUM 40 MG/0.4ML ~~LOC~~ SOLN
40.0000 mg | SUBCUTANEOUS | Status: DC
  Administered 2019-09-02 – 2019-09-03 (×2): 40 mg via SUBCUTANEOUS
  Filled 2019-09-01 (×2): qty 0.4

## 2019-09-01 MED ORDER — EPHEDRINE SULFATE 50 MG/ML IJ SOLN
INTRAMUSCULAR | Status: AC
  Filled 2019-09-01: qty 1

## 2019-09-01 MED ORDER — LACTATED RINGERS IV SOLN
INTRAVENOUS | Status: DC
  Administered 2019-09-01: 11:00:00 via INTRAVENOUS

## 2019-09-01 MED ORDER — CEFAZOLIN SODIUM-DEXTROSE 2-4 GM/100ML-% IV SOLN
2.0000 g | Freq: Four times a day (QID) | INTRAVENOUS | Status: AC
  Administered 2019-09-01 – 2019-09-02 (×2): 2 g via INTRAVENOUS
  Filled 2019-09-01 (×2): qty 100

## 2019-09-01 MED ORDER — TRAMADOL HCL 50 MG PO TABS
50.0000 mg | ORAL_TABLET | Freq: Four times a day (QID) | ORAL | Status: DC
  Administered 2019-09-01 – 2019-09-03 (×8): 50 mg via ORAL
  Filled 2019-09-01 (×8): qty 1

## 2019-09-01 MED ORDER — PANTOPRAZOLE SODIUM 40 MG PO TBEC
40.0000 mg | DELAYED_RELEASE_TABLET | Freq: Every day | ORAL | Status: DC
  Administered 2019-09-01 – 2019-09-03 (×3): 40 mg via ORAL
  Filled 2019-09-01 (×3): qty 1

## 2019-09-01 MED ORDER — LACTASE 3000 UNITS PO TABS
3000.0000 [IU] | ORAL_TABLET | Freq: Three times a day (TID) | ORAL | Status: DC
  Administered 2019-09-01 – 2019-09-03 (×6): 3000 [IU] via ORAL
  Filled 2019-09-01 (×7): qty 1

## 2019-09-01 MED ORDER — PROPOFOL 500 MG/50ML IV EMUL
INTRAVENOUS | Status: DC | PRN
  Administered 2019-09-01: 100 ug/kg/min via INTRAVENOUS

## 2019-09-01 MED ORDER — MAGNESIUM CITRATE PO SOLN
1.0000 | Freq: Once | ORAL | Status: DC | PRN
  Filled 2019-09-01: qty 296

## 2019-09-01 MED ORDER — ACETAMINOPHEN 325 MG PO TABS
325.0000 mg | ORAL_TABLET | Freq: Four times a day (QID) | ORAL | Status: DC | PRN
  Filled 2019-09-01: qty 2

## 2019-09-01 MED ORDER — GLYCOPYRROLATE 0.2 MG/ML IJ SOLN
INTRAMUSCULAR | Status: DC | PRN
  Administered 2019-09-01 (×2): 0.2 mg via INTRAVENOUS

## 2019-09-01 MED ORDER — HYDROMORPHONE HCL 1 MG/ML IJ SOLN: 0.5000 mg | INTRAMUSCULAR | Status: DC | PRN

## 2019-09-01 SURGICAL SUPPLY — 60 items
BLADE SAGITTAL AGGR TOOTH XLG (BLADE) ×3 IMPLANT
BNDG COHESIVE 6X5 TAN STRL LF (GAUZE/BANDAGES/DRESSINGS) ×9 IMPLANT
CANISTER SUCT 1200ML W/VALVE (MISCELLANEOUS) ×3 IMPLANT
CANISTER WOUND CARE 500ML ATS (WOUND CARE) ×3 IMPLANT
CHLORAPREP W/TINT 26 (MISCELLANEOUS) ×3 IMPLANT
COVER BACK TABLE REUSABLE LG (DRAPES) ×3 IMPLANT
COVER WAND RF STERILE (DRAPES) ×3 IMPLANT
CUP ACETAB VERSA DBL 28X58 DMI (Orthopedic Implant) ×2 IMPLANT
DRAPE 3/4 80X56 (DRAPES) ×9 IMPLANT
DRAPE C-ARM XRAY 36X54 (DRAPES) ×3 IMPLANT
DRAPE INCISE IOBAN 66X60 STRL (DRAPES) IMPLANT
DRAPE POUCH INSTRU U-SHP 10X18 (DRAPES) ×3 IMPLANT
DRESSING SURGICEL FIBRLLR 1X2 (HEMOSTASIS) ×2 IMPLANT
DRSG OPSITE POSTOP 4X8 (GAUZE/BANDAGES/DRESSINGS) ×6 IMPLANT
DRSG SURGICEL FIBRILLAR 1X2 (HEMOSTASIS) ×6
ELECT BLADE 6.5 EXT (BLADE) ×3 IMPLANT
ELECT REM PT RETURN 9FT ADLT (ELECTROSURGICAL) ×3
ELECTRODE REM PT RTRN 9FT ADLT (ELECTROSURGICAL) ×1 IMPLANT
GLOVE BIOGEL PI IND STRL 9 (GLOVE) ×1 IMPLANT
GLOVE BIOGEL PI INDICATOR 9 (GLOVE) ×2
GLOVE SURG SYN 9.0  PF PI (GLOVE) ×4
GLOVE SURG SYN 9.0 PF PI (GLOVE) ×2 IMPLANT
GOWN SRG 2XL LVL 4 RGLN SLV (GOWNS) ×1 IMPLANT
GOWN STRL NON-REIN 2XL LVL4 (GOWNS) ×2
GOWN STRL REUS W/ TWL LRG LVL3 (GOWN DISPOSABLE) ×1 IMPLANT
GOWN STRL REUS W/TWL LRG LVL3 (GOWN DISPOSABLE) ×2
HEAD FEMORAL 28MM SZ M (Head) ×2 IMPLANT
HEMOVAC 400CC 10FR (MISCELLANEOUS) IMPLANT
HOLDER FOLEY CATH W/STRAP (MISCELLANEOUS) ×3 IMPLANT
HOOD PEEL AWAY FLYTE STAYCOOL (MISCELLANEOUS) ×3 IMPLANT
KIT PREVENA INCISION MGT 13 (CANNISTER) ×3 IMPLANT
MAT ABSORB  FLUID 56X50 GRAY (MISCELLANEOUS) ×2
MAT ABSORB FLUID 56X50 GRAY (MISCELLANEOUS) ×1 IMPLANT
NDL SAFETY ECLIPSE 18X1.5 (NEEDLE) ×1 IMPLANT
NDL SPNL 20GX3.5 QUINCKE YW (NEEDLE) ×2 IMPLANT
NEEDLE HYPO 18GX1.5 SHARP (NEEDLE) ×2
NEEDLE SPNL 20GX3.5 QUINCKE YW (NEEDLE) ×6 IMPLANT
NS IRRIG 1000ML POUR BTL (IV SOLUTION) ×5 IMPLANT
PACK HIP COMPR (MISCELLANEOUS) ×3 IMPLANT
SCALPEL PROTECTED #10 DISP (BLADE) ×6 IMPLANT
SHELL ACETABULAR SZ0 58MM (Shell) ×2 IMPLANT
SOL PREP PVP 2OZ (MISCELLANEOUS) ×3
SOLUTION PREP PVP 2OZ (MISCELLANEOUS) ×1 IMPLANT
SPONGE DRAIN TRACH 4X4 STRL 2S (GAUZE/BANDAGES/DRESSINGS) ×3 IMPLANT
STAPLER SKIN PROX 35W (STAPLE) ×3 IMPLANT
STEM FEMORAL SZ3  STD COLLARED (Stem) ×2 IMPLANT
STRAP SAFETY 5IN WIDE (MISCELLANEOUS) ×3 IMPLANT
SUT DVC 2 QUILL PDO  T11 36X36 (SUTURE) ×2
SUT DVC 2 QUILL PDO T11 36X36 (SUTURE) ×1 IMPLANT
SUT SILK 0 (SUTURE) ×2
SUT SILK 0 30XBRD TIE 6 (SUTURE) ×1 IMPLANT
SUT V-LOC 90 ABS DVC 3-0 CL (SUTURE) ×3 IMPLANT
SUT VIC AB 1 CT1 36 (SUTURE) ×3 IMPLANT
SYR 20ML LL LF (SYRINGE) ×3 IMPLANT
SYR 30ML LL (SYRINGE) ×3 IMPLANT
SYR 50ML LL SCALE MARK (SYRINGE) ×6 IMPLANT
SYR BULB IRRIG 60ML STRL (SYRINGE) ×3 IMPLANT
TAPE MICROFOAM 4IN (TAPE) ×3 IMPLANT
TOWEL OR 17X26 4PK STRL BLUE (TOWEL DISPOSABLE) ×3 IMPLANT
TRAY FOLEY MTR SLVR 16FR STAT (SET/KITS/TRAYS/PACK) ×3 IMPLANT

## 2019-09-01 NOTE — Anesthesia Post-op Follow-up Note (Signed)
Anesthesia QCDR form completed.        

## 2019-09-01 NOTE — Op Note (Signed)
09/01/2019  4:01 PM  PATIENT:  Roy Payment Schmaltz Sr.  53 y.o. male  PRE-OPERATIVE DIAGNOSIS:  PRIMARY OSTEOARTHRITIS OF RIGHT HIP  POST-OPERATIVE DIAGNOSIS:  PRIMARY OSTEOARTHRITIS OF RIGHT HIP  PROCEDURE:  Procedure(s) with comments: TOTAL HIP ARTHROPLASTY ANTERIOR APPROACH (Right) APPLICATION OF WOUND VAC (Right) - Serial # [id number]  SURGEON: Laurene Footman, MD  ASSISTANTS: None  ANESTHESIA:   spinal  EBL:  Total I/O In: 750 [I.V.:750] Out: 550 [Urine:150; Blood:400]  BLOOD ADMINISTERED:none  DRAINS: none   LOCAL MEDICATIONS USED:  MARCAINE    and OTHER Exparel  SPECIMEN:  Source of Specimen:  Right femoral head  DISPOSITION OF SPECIMEN:  PATHOLOGY  COUNTS:  YES  TOURNIQUET:  * No tourniquets in log *  IMPLANTS: Medactra AMIS the standard stem, 56 mm Mpact DM cup and liner with M ceramic 28 mm head  DICTATION: .Dragon Dictation   The patient was brought to the operating room and after spinal anesthesia was obtained patient was placed on the operative table with the ipsilateral foot into the Medacta attachment, contralateral leg on a well-padded table. C-arm was brought in and preop template x-ray taken. After prepping and draping in usual sterile fashion appropriate patient identification and timeout procedures were completed. Anterior approach to the hip was obtained and centered over the greater trochanter and TFL muscle. The subcutaneous tissue was incised hemostasis being achieved by electrocautery. TFL fascia was incised and the muscle retracted laterally deep retractor placed. The lateral femoral circumflex vessels were identified and ligated. The anterior capsule was exposed and a capsulotomy performed. The neck was identified and a femoral neck cut carried out with a saw. The head was removed without difficulty and showed sclerotic femoral head and acetabulum. Reaming was carried out to 56 mm and a 56 mm cup trial gave appropriate tightness to the acetabular  component a 56 cup was impacted into position. The leg was then externally rotated and ischiofemoral and pubofemoral releases carried out. The femur was sequentially broached to a size 3 , size 3 standard with M head trials were placed and the final components chosen. The 3 standard stem was inserted along with a ceramic M 28 mm head and 56 mm liner. The hip was reduced and was stable the wound was thoroughly irrigated with fibrillar placed along the posterior capsule and medial neck. The deep fascia ws closed using a heavy Quill after infiltration of 30 cc of quarter percent Sensorcaine with epinephrine.3-0 V-loc to close the skin with skin staples.  Incisional wound VAC applied and patient was sent to recovery in stable condition.   PLAN OF CARE: Admit to inpatient

## 2019-09-01 NOTE — Anesthesia Procedure Notes (Signed)
Spinal  Patient location during procedure: OR Start time: 09/01/2019 1:35 PM End time: 09/01/2019 1:45 PM Staffing Resident/CRNA: Justus Memory, CRNA Preanesthetic Checklist Completed: patient identified, site marked, surgical consent, pre-op evaluation, timeout performed, IV checked, risks and benefits discussed and monitors and equipment checked Spinal Block Patient position: sitting Prep: Betadine Patient monitoring: heart rate, continuous pulse ox, blood pressure and cardiac monitor Approach: midline Location: L4-5 Injection technique: single-shot Needle Needle type: Whitacre and Introducer  Needle gauge: 25 G Needle length: 9 cm Additional Notes Negative paresthesia. Negative blood return. Positive free-flowing CSF. Expiration date of kit checked and confirmed. Patient tolerated procedure well, without complications.

## 2019-09-01 NOTE — Anesthesia Postprocedure Evaluation (Signed)
Anesthesia Post Note  Patient: Roy Braniff Hopwood Sr.  Procedure(s) Performed: TOTAL HIP ARTHROPLASTY ANTERIOR APPROACH (Right Hip) APPLICATION OF WOUND VAC (Right Hip)  Anesthesia Type: Spinal and General     Last Vitals:  Vitals:   09/01/19 1028 09/01/19 1556  BP: (!) 175/118 92/63  Pulse: (!) 53   Resp: 16   Temp: 36.7 C   SpO2: 99%     Last Pain:  Vitals:   09/01/19 1028  TempSrc: Temporal  PainSc: [place]

## 2019-09-01 NOTE — TOC Progression Note (Signed)
Transition of Care (TOC) - Progression Note    Patient Details  Name: Roy Dershem [facility] Sr. MRN: [id number] Date of Birth: Dec 16, 1966  [facility] CM/SW Contact  Su Hilt, RN Phone Number: 09/01/2019, 10:23 AM  Clinical Narrative:      Requested the price of Lovenox, will notify the patient once obtained      Expected Discharge Plan and Services                                                 Social Determinants of Health (SDOH) Interventions    Readmission Risk Interventions No flowsheet data found.

## 2019-09-01 NOTE — Anesthesia Preprocedure Evaluation (Signed)
Anesthesia Evaluation  Patient identified by MRN, date of birth, ID band Patient awake    Reviewed: Allergy & Precautions, H&P , NPO status , [name] Chart, lab work & pertinent test results, reviewed documented beta blocker date and time   History of Anesthesia Complications (+) PONV and history of anesthetic complications  Airway Mallampati: II  TM Distance: >3 FB Neck ROM: full    Dental  (+) Dental Advidsory Given, Edentulous Upper, Edentulous Lower   Pulmonary neg shortness of breath, asthma , neg sleep apnea, neg COPD, neg recent URI, Current SmokerPatient did not abstain from smoking.,    Pulmonary exam normal        Cardiovascular Exercise Tolerance: Good hypertension, (-) angina(-) Past MI and (-) Cardiac Stents Normal cardiovascular exam+ dysrhythmias (-) Valvular Problems/Murmurs     Neuro/Psych PSYCHIATRIC DISORDERS Depression Bipolar Disorder negative neurological ROS     GI/Hepatic GERD  ,(+) Hepatitis - (s/p treatment), C  Endo/Other  negative endocrine ROS  Renal/GU negative Renal ROS  negative genitourinary   Musculoskeletal   Abdominal   Peds  Hematology  (+) HIV,   Anesthesia Other Findings Past Medical History: No date: AIDS (acquired immune deficiency syndrome) (HCC) No date: Arthritis No date: Asthma No date: Bipolar disorder ([place]) No date: Bronchitis No date: Complication of anesthesia No date: Depression No date: Dysrhythmia     Comment:  1st degree heart block/ brady No date: GERD (gastroesophageal reflux disease) No date: Hepatitis C     Comment:  treated No date: HIV (human immunodeficiency virus infection) (HCC) No date: HTN (hypertension) No date: PONV (postoperative nausea and vomiting)   Reproductive/Obstetrics negative OB ROS                             Anesthesia Physical Anesthesia Plan  ASA: III  Anesthesia Plan: Spinal   Post-op Pain  Management:    Induction:   PONV Risk Score and Plan: 1 and Propofol infusion and TIVA  Airway Management Planned: Natural Airway and Simple Face Mask  Additional Equipment:   Intra-op Plan:   Post-operative Plan:   Informed Consent: I have reviewed the patients History and Physical, chart, labs and discussed the procedure including the risks, benefits and alternatives for the proposed anesthesia with the patient or authorized representative who has indicated his/her understanding and acceptance.     Dental Advisory Given  Plan Discussed with: Anesthesiologist, CRNA and Surgeon  Anesthesia Plan Comments:         Anesthesia Quick Evaluation

## 2019-09-01 NOTE — Progress Notes (Signed)
15 minute call to floor. 

## 2019-09-01 NOTE — Transfer of Care (Signed)
Immediate Anesthesia Transfer of Care Note  Patient: Roy Lemmerman [place] Sr.  Procedure(s) Performed: TOTAL HIP ARTHROPLASTY ANTERIOR APPROACH (Right Hip) APPLICATION OF WOUND VAC (Right Hip)  Patient Location: PACU  Anesthesia Type:Spinal  Level of Consciousness: awake, alert  and oriented  Airway & Oxygen Therapy: Patient Spontanous Breathing  Post-op Assessment: Report given to RN and Post -op Vital signs reviewed and stable  Post vital signs: Reviewed and stable  Last Vitals:  Vitals Value Taken Time  BP    Temp    Pulse 73 09/01/19 1554  Resp 12 09/01/19 1554  SpO2 94 % 09/01/19 1554  Vitals shown include unvalidated device data.  Last Pain:  Vitals:   09/01/19 1028  TempSrc: Temporal  PainSc: 8          Complications: No apparent anesthesia complications

## 2019-09-01 NOTE — Progress Notes (Signed)
Ortho floor requested we wait another 15 minutes prior to bringing patient to floor.

## 2019-09-02 DIAGNOSIS — G629 Polyneuropathy, unspecified: Secondary | ICD-10-CM

## 2019-09-02 LAB — BASIC METABOLIC PANEL
Anion gap: 5 (ref 5–15)
BUN: 18 mg/dL (ref 6–20)
CO2: 23 mmol/L (ref 22–32)
Calcium: 8.2 mg/dL — ABNORMAL LOW (ref 8.9–10.3)
Chloride: 107 mmol/L (ref 98–111)
Creatinine, Ser: 1.29 mg/dL — ABNORMAL HIGH (ref 0.61–1.24)
GFR calc Af Amer: >60 mL/min (ref >60)
GFR calc non Af Amer: >60 mL/min (ref >60)
Glucose, Bld: 132 mg/dL — ABNORMAL HIGH (ref 70–99)
Potassium: 4.1 mmol/L (ref 3.5–5.1)
Sodium: 135 mmol/L (ref 135–145)

## 2019-09-02 LAB — CBC
HCT: 36.3 % — ABNORMAL LOW (ref 39.0–52.0)
Hemoglobin: 12.5 g/dL — ABNORMAL LOW (ref 13.0–17.0)
MCH: 33.6 pg (ref 26.0–34.0)
MCHC: 34.4 g/dL (ref 30.0–36.0)
MCV: 97.6 fL (ref 80.0–100.0)
Platelets: 190 10*3/uL (ref 150–400)
RBC: 3.72 MIL/uL — ABNORMAL LOW (ref 4.22–5.81)
RDW: 12 % (ref 11.5–15.5)
WBC: 9 10*3/uL (ref 4.0–10.5)
nRBC: 0 % (ref 0.0–0.2)

## 2019-09-02 MED ORDER — ENOXAPARIN SODIUM 40 MG/0.4ML ~~LOC~~ SOLN: 40.0000 mg | SUBCUTANEOUS | 14 refills | Status: DC

## 2019-09-02 MED ORDER — OXYCODONE HCL 5 MG PO TABS: 5.0000 mg | ORAL_TABLET | ORAL | 0 refills | Status: DC | PRN

## 2019-09-02 MED ORDER — TRAZODONE 300 MG TABLET
ORAL_TABLET | 3 refills | 0 days | Status: CP
Start: 2019-09-02 — End: ?

## 2019-09-02 MED ORDER — GABAPENTIN 300 MG CAPSULE
ORAL_CAPSULE | 0 refills | 0 days | Status: CP
Start: 2019-09-02 — End: ?

## 2019-09-02 NOTE — Evaluation (Signed)
Physical Therapy Evaluation Patient Details Name: Roy [name] [facility] MRN: [id number] DOB: June 16, 1966 Today's Date: 09/02/2019   History of Present Illness  Pt is a 53 yo male s/p R THR on 09/01/19. PMH includes HTN, dysrhythmia, HIV, AIDs, hep C  Clinical Impression  Pt is a 53 yo male admitted s/p R THR. Pt in bed upon arrival and agreed to participate with PT. Pt AxO x4 and very pleasant throughout session. Pt educated on precautions and HEP with pt verbalizing understanding and demonstrating HEP. Pt required min VC for correct performance of exercises and some physical assist. Pt reports pain 6/10 but that it is much better than last night. Pt required min guard assist for functional mobility and cuing for correct performance of mobility tasks within precautions. Pt ambulated ~180 ft with RW with no LOB, buckling or unsteadiness. Pt presents with decreased strength, ROM, balance consistent with recent surgery. Pt would benefit from skilled acute therapy to improve deficits and would benefit from home health PT following hospital discharge to further improve deficits and allow for return to PLOF.     Follow Up Recommendations Home health PT    Equipment Recommendations  Rolling walker with 5" wheels;3in1 (PT)    Recommendations for Other Services       Precautions / Restrictions Precautions Precautions: Anterior Hip;Fall Precaution Booklet Issued: Yes (comment) Restrictions Weight Bearing Restrictions: Yes RLE Weight Bearing: Weight bearing as tolerated      Mobility  Bed Mobility Overal bed mobility: Needs Assistance Bed Mobility: Supine to Sit     Supine to sit: Min guard;HOB elevated     General bed mobility comments: min guard for safety, cuing for sequencing, no physical assist required to sit EOB, pt utilized bed rails and increased time  Transfers Overall transfer level: Needs assistance Equipment used: Rolling walker (2 wheeled) Transfers: Sit to/from  Stand Sit to Stand: Min guard         General transfer comment: pt safe and steady with sit to stand, min guard for safety and line management, cuing for hand and foot placement  Ambulation/Gait Ambulation/Gait assistance: Min guard Gait Distance (Feet): 180 Feet Assistive device: Rolling walker (2 wheeled) Gait Pattern/deviations: Step-to pattern;Antalgic;Decreased stride length;Decreased weight shift to right Gait velocity: decreased   General Gait Details: pt ambulated lap around nurses station with mildly antalgic gait pattern and increased reliance on UE support from walker, pt steady and safe with ambulation with no overt LOB or instances of unsteadiness  Stairs            Wheelchair Mobility    Modified Rankin (Stroke Patients Only)       Balance Overall balance assessment: Needs assistance   Sitting balance-Leahy Scale: Good     Standing balance support: Bilateral upper extremity supported;During functional activity Standing balance-Leahy Scale: Poor Standing balance comment: pt reliant on UE support from RW secondary to recent surgery                             Pertinent Vitals/Pain Pain Assessment: 0-10 Pain Score: 6  Pain Location: R hip Pain Descriptors / Indicators: Aching;Sore;Operative site guarding Pain Intervention(s): Limited activity within patient's tolerance;Monitored during session;Repositioned    Home Living Family/patient expects to be discharged to:: Private residence Living Arrangements: Spouse/significant other Available Help at Discharge: Family;Available 24 hours/day Type of Home: Mobile home Home Access: Stairs to enter Entrance Stairs-Rails: Can reach both Entrance Stairs-Number of Steps: 4 Home  Layout: One level Home Equipment: None      Prior Function Level of Independence: Independent         Comments: pt reports completely independent with ADLs/IADLs and just last week doing yard and house work      Journalist, newspaper        Extremity/Trunk Assessment   Upper Extremity Assessment Upper Extremity Assessment: Defer to OT evaluation    Lower Extremity Assessment Lower Extremity Assessment: RLE deficits/detail RLE Deficits / Details: strength, ROM and balance deficits consistent with recent surgery    Cervical / Trunk Assessment Cervical / Trunk Assessment: Normal  Communication   Communication: No difficulties  Cognition Arousal/Alertness: Awake/alert Behavior During Therapy: WFL for tasks assessed/performed Overall Cognitive Status: Within Functional Limits for tasks assessed                                        General Comments      Exercises Total Joint Exercises Ankle Circles/Pumps: AROM;Both;10 reps Quad Sets: AROM;Right;10 reps Gluteal Sets: AROM;Both;10 reps Towel Squeeze: AROM;Both;10 reps Hip ABduction/ADduction: AAROM;Right;5 reps Straight Leg Raises: AAROM;Right;5 reps Long Arc Quad: AROM;Right;10 reps   Assessment/Plan    PT Assessment Patient needs continued PT services  PT Problem List Decreased strength;Decreased mobility;Decreased range of motion;Decreased knowledge of precautions;Decreased activity tolerance;Decreased balance;Decreased knowledge of use of DME;Pain       PT Treatment Interventions DME instruction;Therapeutic exercise;Gait training;Balance training;Stair training;Neuromuscular re-education;Functional mobility training;Therapeutic activities;Patient/family education    PT Goals (Current goals can be found in the Care Plan section)  Acute Rehab PT Goals Patient Stated Goal: return to normal activities such as yard work PT Goal Formulation: With patient Time For Goal Achievement: 09/16/19 Potential to Achieve Goals: Good    Frequency BID   Barriers to discharge Inaccessible home environment(pt reports their home does not have A/C and they have to leave during the day due to the heat)      Co-evaluation                AM-PAC PT "6 Clicks" Mobility  Outcome Measure Help needed turning from your back to your side while in a flat bed without using bedrails?: A Little Help needed moving from lying on your back to sitting on the side of a flat bed without using bedrails?: A Little Help needed moving to and from a bed to a chair (including a wheelchair)?: A Little Help needed standing up from a chair using your arms (e.g., wheelchair or bedside chair)?: A Little Help needed to walk in hospital room?: A Little Help needed climbing 3-5 steps with a railing? : A Little 6 Click Score: 18    End of Session Equipment Utilized During Treatment: Gait belt Activity Tolerance: Patient tolerated treatment well Patient left: in chair;with call bell/phone within reach;with SCD's reapplied Nurse Communication: Mobility status PT Visit Diagnosis: Other abnormalities of gait and mobility (R26.89);Muscle weakness (generalized) (M62.81);Pain Pain - Right/Left: Right Pain - part of body: Hip    Time: 7026-3785 PT Time Calculation (min) (ACUTE ONLY): 31 min   Charges:   PT Evaluation $PT Eval Moderate Complexity: 1 Mod PT Treatments $Therapeutic Exercise: 8-22 mins       Crucita Lacorte PT, DPT 10:03 AM,09/02/19 [phone]

## 2019-09-02 NOTE — Addendum Note (Signed)
Addendum  created 09/02/19 1042 by Aline Brochure, CRNA   Clinical Note Signed

## 2019-09-02 NOTE — Anesthesia Postprocedure Evaluation (Signed)
Anesthesia Post Note  Patient: Roy [facility] Sr.  Procedure(s) Performed: TOTAL HIP ARTHROPLASTY ANTERIOR APPROACH (Right Hip) APPLICATION OF WOUND VAC (Right Hip)  Patient location during evaluation: Nursing Unit Anesthesia Type: Spinal Level of consciousness: oriented and awake and alert Pain management: pain level controlled Vital Signs Assessment: post-procedure vital signs reviewed and stable Respiratory status: spontaneous breathing, respiratory function stable and patient connected to nasal cannula oxygen Cardiovascular status: blood pressure returned to baseline and stable Postop Assessment: no headache, no backache and no apparent nausea or vomiting Anesthetic complications: no     Last Vitals:  Vitals:   09/02/19 0442 09/02/19 0744  BP: 101/75 106/79  Pulse: 81 83  Resp: 16   Temp: 37.3 C 36.9 C  SpO2: 97% 96%    Last Pain:  Vitals:   09/02/19 1022  TempSrc:   PainSc: 7                  Roxana Lai,  Clearnce Sorrel

## 2019-09-02 NOTE — Evaluation (Signed)
Occupational Therapy Evaluation Patient [name] MRN: [id number] DOB: 1966/02/18 Today's Date: 09/02/2019    History of Present Illness Pt is a 53 yo male s/p R THR on 09/01/19. PMH includes HTN, dysrhythmia, HIV, AIDs, hep C   Clinical Impression   [name] was seen for OT evaluation this date, POD#1 from above surgery. Pt reports he was active and independent in all ADLs prior to surgery. Pt is eager to return to PLOF with less pain and improved safety and independence. Pt currently requires minimal to moderate assist for LB dressing and bathing while in seated position due to pain and limited AROM of R hip. Pt instructed in self care skills, falls prevention strategies, home/routines modifications, DME/AE for LB bathing and dressing tasks, and compression stocking mgt strategies. Handout provided. Pt able to return demonstrate use of sock aid and LH reacher for LB dressing this date with min VCs for technique. Pt would benefit from additional instruction in self care skills and techniques to help maintain precautions with or without assistive devices to support recall and carryover prior to discharge. Recommend HHOT upon discharge.      Follow Up Recommendations  Home health OT    Equipment Recommendations  3 in 1 bedside commode    Recommendations for Other Services       Precautions / Restrictions Precautions Precautions: Anterior Hip;Fall Precaution Booklet Issued: Yes (comment) Restrictions Weight Bearing Restrictions: Yes RLE Weight Bearing: Weight bearing as tolerated      Mobility Bed Mobility Overal bed mobility: Needs Assistance Bed Mobility: Sit to Supine     Supine to sit: Min guard;HOB elevated Sit to supine: Min assist;HOB elevated   General bed mobility comments: Min assist for mgt of RLE into bed during sit>sup this date.  Transfers Overall transfer level: Needs assistance Equipment used: Rolling walker (2  wheeled) Transfers: Sit to/from Stand Sit to Stand: Supervision         General transfer comment: pt safe and steady with sit to stand, SBA for safety and line management, minimal cuing for hand and foot placement    Balance Overall balance assessment: Needs assistance Sitting-balance support: Feet supported;No upper extremity supported Sitting balance-Leahy Scale: Good Sitting balance - Comments: Steady sitting, reaching within BOS. Good static and dynamic balance when completing functional activities including LB dressing this date.   Standing balance support: During functional activity;Single extremity supported Standing balance-Leahy Scale: Fair Standing balance comment: Pt demonstrated consistent UE use on RW when standing/ambulating. Able to maintain static standing with 1 UE on RW for brief time.                           ADL either performed or assessed with clinical judgement   ADL                                         General ADL Comments: Min to moderate assist for LB dressing and bathing tasks in seated position due to increased pain and decreased ROM in the RLE. Pt overal supervision with SBA for mgt of lines/leads during functional mobility. Demonstrates good safety awareness and safe use of AE for mobility.     Vision Baseline Vision/History: Wears glasses Wears Glasses: Reading only Patient Visual Report: No change from baseline       Perception  Praxis      Pertinent Vitals/Pain Pain Assessment: 0-10 Pain Score: 6  Pain Location: R hip Pain Descriptors / Indicators: Aching;Sore;Operative site guarding Pain Intervention(s): Limited activity within patient's tolerance;Monitored during session;Repositioned     Hand Dominance Left   Extremity/Trunk Assessment Upper Extremity Assessment Upper Extremity Assessment: Overall WFL for tasks assessed(Good functional UE use with bathing, UB dressing, LB Dressing, and functional  mobility. BUE WNL.)   Lower Extremity Assessment Lower Extremity Assessment: Defer to PT evaluation;RLE deficits/detail RLE Deficits / Details: RLE s/p THA. RLE Coordination: decreased gross motor   Cervical / Trunk Assessment Cervical / Trunk Assessment: Normal   Communication Communication Communication: No difficulties   Cognition Arousal/Alertness: Awake/alert Behavior During Therapy: WFL for tasks assessed/performed Overall Cognitive Status: Within Functional Limits for tasks assessed                                 General Comments: Pleasent; A&O x4, able to follow multistep commands consistently.   General Comments  Wound vac in place at start/end of session.    Exercises Total Joint Exercises Ankle Circles/Pumps: AROM;Both;10 reps Quad Sets: AROM;Right;10 reps Gluteal Sets: AROM;Both;10 reps Towel Squeeze: AROM;Both;10 reps Hip ABduction/ADduction: AAROM;Right;5 reps Straight Leg Raises: AAROM;Right;5 reps Long Arc Quad: AROM;Right;10 reps Other Exercises Other Exercises: Pt educated on falls prevention strategies, safe use of DME/AE for ADL mgt, Safe use of AE for functional mobility, compression stocking mgt, and routines modifications to support safety and functional independence upon hospital DC. Handout provided.   Shoulder Instructions      Home Living Family/patient expects to be discharged to:: Private residence Living Arrangements: Spouse/significant other Available Help at Discharge: Family;Available 24 hours/day Type of Home: Mobile home Home Access: Stairs to enter Entrance Stairs-Number of Steps: 4 Entrance Stairs-Rails: Can reach both Home Layout: One level     Bathroom Shower/Tub: Tub/shower unit;Walk-in shower   Bathroom Toilet: Standard     Home Equipment: None          Prior Functioning/Environment Level of Independence: Independent        Comments: pt reports completely independent with ADLs/IADLs and just last  week doing yard and house work        OT Problem List: Decreased strength;Decreased coordination;Pain;Decreased range of motion;Decreased activity tolerance;Decreased safety awareness;Decreased knowledge of use of DME or AE;Decreased knowledge of precautions;Impaired balance (sitting and/or standing)      OT Treatment/Interventions: Self-care/ADL training;Balance training;Therapeutic exercise;Therapeutic activities;DME and/or AE instruction;Patient/family education    OT Goals(Current goals can be found in the care plan section) Acute Rehab OT Goals Patient Stated Goal: return to normal activities such as yard work OT Goal Formulation: With patient Time For Goal Achievement: 09/16/19 Potential to Achieve Goals: Good ADL Goals Pt Will Perform Lower Body Bathing: sitting/lateral leans;with modified independence(With LRAD PRN for improved safety and functional independence) Pt Will Perform Lower Body Dressing: with modified independence;sit to/from stand(With LRAD PRN for improved safety and functional independence) Pt Will Transfer to Toilet: with modified independence;ambulating;bedside commode(With LRAD PRN for improved safety and functional independence) Pt Will Perform Toileting - Clothing Manipulation and hygiene: sit to/from stand;with set-up;with modified independence(With LRAD PRN for improved safety and functional independence)  OT Frequency: Min 2X/week   Barriers to D/C: Inaccessible home environment  Pt with 4 steps to enter the home       Co-evaluation              AM-PAC  OT "6 Clicks" Daily Activity     Outcome Measure Help from another person eating meals?: None Help from another person taking care of personal grooming?: A Little Help from another person toileting, which includes using toliet, bedpan, or urinal?: A Little Help from another person bathing (including washing, rinsing, drying)?: A Lot Help from another person to put on and taking off regular upper  body clothing?: A Little Help from another person to put on and taking off regular lower body clothing?: A Lot 6 Click Score: 17   End of Session Equipment Utilized During Treatment: Gait belt;Rolling walker  Activity Tolerance: Patient tolerated treatment well Patient left: in bed;with call bell/phone within reach;with bed alarm set;with SCD's reapplied;with nursing/sitter in room(With CNA in room to finish taking vitals.)  OT Visit Diagnosis: Other abnormalities of gait and mobility (R26.89);Pain Pain - Right/Left: Right Pain - part of body: Hip                Time: 9357-0177 OT Time Calculation (min): 31 min Charges:  OT General Charges $OT Visit: 1 Visit OT Evaluation $OT Eval Low Complexity: 1 Low OT Treatments $Self Care/Home Management : 23-37 mins  Rockney Ghee, M.S., OTR/L Ascom: [phone] 09/02/19, 12:14 PM

## 2019-09-02 NOTE — Discharge Instructions (Signed)
°ANTERIOR APPROACH TOTAL HIP REPLACEMENT POSTOPERATIVE DIRECTIONS ° ° °Hip Rehabilitation, Guidelines Following Surgery  °The results of a hip operation are greatly improved after range of motion and muscle strengthening exercises. Follow all safety measures which are given to protect your hip. If any of these exercises cause increased pain or swelling in your joint, decrease the amount until you are comfortable again. Then slowly increase the exercises. Call your caregiver if you have problems or questions.  ° °HOME CARE INSTRUCTIONS  °Remove items at home which could result in a fall. This includes throw rugs or furniture in walking pathways.  °· ICE to the affected hip every three hours for 30 minutes at a time and then as needed for pain and swelling.  Continue to use ice on the hip for pain and swelling from surgery. You may notice swelling that will progress down to the foot and ankle.  This is normal after surgery.  Elevate the leg when you are not up walking on it.   °· Continue to use the breathing machine which will help keep your temperature down.  It is common for your temperature to cycle up and down following surgery, especially at night when you are not up moving around and exerting yourself.  The breathing machine keeps your lungs expanded and your temperature down. °· Do not place pillow under knee, focus on keeping the knee straight while resting ° °DIET °You may resume your previous home diet once your are discharged from the hospital. ° °DRESSING / WOUND CARE / SHOWERING °Please remove provena negative pressure dressing on 09/10/2019 and apply honey comb dressing. Keep dressing clean and dry at all times. ° ° °ACTIVITY °Walk with your walker as instructed. °Use walker as long as suggested by your caregivers. °Avoid periods of inactivity such as sitting longer than an hour when not asleep. This helps prevent blood clots.  °You may resume a sexual relationship in one month or when given the OK by  your doctor.  °You may return to work once you are cleared by your doctor.  °Do not drive a car for 6 weeks or until released by you surgeon.  °Do not drive while taking narcotics. ° °WEIGHT BEARING °Weight bearing as tolerated. Use walker/cane as needed for at least 4 weeks post op. ° °POSTOPERATIVE CONSTIPATION PROTOCOL °Constipation - defined medically as fewer than three stools per week and severe constipation as less than one stool per week. ° °One of the most common issues patients have following surgery is constipation.  Even if you have a regular bowel pattern at home, your normal regimen is likely to be disrupted due to multiple reasons following surgery.  Combination of anesthesia, postoperative narcotics, change in appetite and fluid intake all can affect your bowels.  In order to avoid complications following surgery, here are some recommendations in order to help you during your recovery period. ° °Colace (docusate) - Pick up an over-the-counter form of Colace or another stool softener and take twice a day as long as you are requiring postoperative pain medications.  Take with a full glass of water daily.  If you experience loose stools or diarrhea, hold the colace until you stool forms back up.  If your symptoms do not get better within 1 week or if they get worse, check with your doctor. ° °Dulcolax (bisacodyl) - Pick up over-the-counter and take as directed by the product packaging as needed to assist with the movement of your bowels.  Take with a   full glass of water.  Use this product as needed if not relieved by Colace only.  ° °MiraLax (polyethylene glycol) - Pick up over-the-counter to have on hand.  MiraLax is a solution that will increase the amount of water in your bowels to assist with bowel movements.  Take as directed and can mix with a glass of water, juice, soda, coffee, or tea.  Take if you go more than two days without a movement. °Do not use MiraLax more than once per day. Call your  doctor if you are still constipated or irregular after using this medication for 7 days in a row. ° °If you continue to have problems with postoperative constipation, please contact the office for further assistance and recommendations.  If you experience "the worst abdominal pain ever" or develop nausea or vomiting, please contact the office immediatly for further recommendations for treatment. ° °ITCHING ° If you experience itching with your medications, try taking only a single pain pill, or even half a pain pill at a time.  You can also use Benadryl over the counter for itching or also to help with sleep.  ° °TED HOSE STOCKINGS °Wear the elastic stockings on both legs for six weeks following surgery during the day but you may remove then at night for sleeping. ° °MEDICATIONS °See your medication summary on the “After Visit Summary” that the nursing staff will review with you prior to discharge.  You may have some home medications which will be placed on hold until you complete the course of blood thinner medication.  It is important for you to complete the blood thinner medication as prescribed by your surgeon.  Continue your approved medications as instructed at time of discharge. ° °PRECAUTIONS °If you experience chest pain or shortness of breath - call 911 immediately for transfer to the hospital emergency department.  °If you develop a fever greater that 101 F, purulent drainage from wound, increased redness or drainage from wound, foul odor from the wound/dressing, or calf pain - CONTACT YOUR SURGEON.   °                                                °FOLLOW-UP APPOINTMENTS °Make sure you keep all of your appointments after your operation with your surgeon and caregivers. You should call the office at the above phone number and make an appointment for approximately two weeks after the date of your surgery or on the date instructed by your surgeon outlined in the "After Visit Summary". ° °RANGE OF MOTION  AND STRENGTHENING EXERCISES  °These exercises are designed to help you keep full movement of your hip joint. Follow your caregiver's or physical therapist's instructions. Perform all exercises about fifteen times, three times per day or as directed. Exercise both hips, even if you have had only one joint replacement. These exercises can be done on a training (exercise) mat, on the floor, on a table or on a bed. Use whatever works the best and is most comfortable for you. Use music or television while you are exercising so that the exercises are a pleasant break in your day. This will make your life better with the exercises acting as a break in routine you can look forward to.  °Lying on your back, slowly slide your foot toward your buttocks, raising your knee up off the floor. Then   slowly slide your foot back down until your leg is straight again.  °Lying on your back spread your legs as far apart as you can without causing discomfort.  °Lying on your side, raise your upper leg and foot straight up from the floor as far as is comfortable. Slowly lower the leg and repeat.  °Lying on your back, tighten up the muscle in the front of your thigh (quadriceps muscles). You can do this by keeping your leg straight and trying to raise your heel off the floor. This helps strengthen the largest muscle supporting your knee.  °Lying on your back, tighten up the muscles of your buttocks both with the legs straight and with the knee bent at a comfortable angle while keeping your heel on the floor.  ° °IF YOU ARE TRANSFERRED TO A SKILLED REHAB FACILITY °If the patient is transferred to a skilled rehab facility following release from the hospital, a list of the current medications will be sent to the facility for the patient to continue.  When discharged from the skilled rehab facility, please have the facility set up the patient's Home Health Physical Therapy prior to being released. Also, the skilled facility will be responsible  for providing the patient with their medications at time of release from the facility to include their pain medication, the muscle relaxants, and their blood thinner medication. If the patient is still at the rehab facility at time of the two week follow up appointment, the skilled rehab facility will also need to assist the patient in arranging follow up appointment in our office and any transportation needs. ° °MAKE SURE YOU:  °Understand these instructions.  °Get help right away if you are not doing well or get worse.  ° ° °Pick up stool softner and laxative for home use following surgery while on pain medications. °Continue to use ice for pain and swelling after surgery. °Do not use any lotions or creams on the incision until instructed by your surgeon. ° °

## 2019-09-02 NOTE — Progress Notes (Signed)
Physical Therapy Treatment Patient Details Name: Roy Gladd [facility] Sr. MRN: [id number] DOB: 04-29-1966 Today's Date: 09/02/2019    History of Present Illness Pt is a 53 yo male s/p R THR on 09/01/19. PMH includes HTN, dysrhythmia, HIV, AIDs, hep C    PT Comments    Pt in bed with family present and agreed to participate with PT. Pt is progressing well towards PT goals. Pt progressed therex this afternoon increasing reps and requiring less assistance. Pt reports pain at 4/10 in right hip along the incision. Pt performed supine to sit and sit to stand transfers with supervision assist and required min assist for RLE advancement during sit to supine. Pt progressed ambulation tolerance with an improved gait pattern with improved cadence, speed and a step through pattern. Pt will benefit from continued skilled acute therapy to improve deficits and initiate stair training. Home health PT following hospital discharge remains appropriate.   Follow Up Recommendations  Home health PT     Equipment Recommendations  Rolling walker with 5" wheels;3in1 (PT)    Recommendations for Other Services       Precautions / Restrictions Precautions Precautions: Fall;Anterior Hip Precaution Booklet Issued: Yes (comment) Restrictions Weight Bearing Restrictions: Yes RLE Weight Bearing: Weight bearing as tolerated    Mobility  Bed Mobility Overal bed mobility: Needs Assistance Bed Mobility: Sit to Supine;Supine to Sit     Supine to sit: HOB elevated;Supervision Sit to supine: Min assist;HOB elevated   General bed mobility comments: min guard for supine to sit, pt utilized bed rails and increased time and effort to sit EOB but no physical assist needed, min assist for sit to supine for RLE management  Transfers Overall transfer level: Needs assistance Equipment used: Rolling walker (2 wheeled) Transfers: Sit to/from Stand Sit to Stand: Supervision         General transfer comment: pt safe and  steady with sit to stand, SBA for safety and line management, good carryover of hand and foot placement, x2 trials from bed and BSC  Ambulation/Gait Ambulation/Gait assistance: Min guard Gait Distance (Feet): 200 Feet Assistive device: Rolling walker (2 wheeled) Gait Pattern/deviations: Step-through pattern;Decreased weight shift to right;Antalgic Gait velocity: decreased   General Gait Details: pt progressed to step through gait pattern this session, pt ambulated with mildly antalgic gait pattern and heavy reliance on UE support from RW, pt improved gait speed and cadence from this morning, no overt buckling, unsteadiness or LOB   Stairs             Wheelchair Mobility    Modified Rankin (Stroke Patients Only)       Balance Overall balance assessment: Needs assistance Sitting-balance support: Feet supported Sitting balance-Leahy Scale: Good Sitting balance - Comments: steady sitting EOB and performing LE exercises   Standing balance support: Bilateral upper extremity supported;During functional activity Standing balance-Leahy Scale: Fair Standing balance comment: pt reliant on UE support from RW                            Cognition Arousal/Alertness: Awake/alert Behavior During Therapy: WFL for tasks assessed/performed Overall Cognitive Status: Within Functional Limits for tasks assessed                                 General Comments: Pleasent; A&O x4, able to follow multistep commands consistently.      Exercises Total Joint Exercises Ankle  Circles/Pumps: AROM;Both;10 reps Quad Sets: AROM;Right;10 reps Gluteal Sets: AROM;Both;10 reps Hip ABduction/ADduction: AAROM;Right;10 reps Straight Leg Raises: AAROM;Right;10 reps Long Arc Quad: AROM;Right;10 reps Marching in Standing: AROM;Both;10 reps;Standing Other Exercises Other Exercises:     General Comments General comments (skin integrity, edema, etc.): wound vac in place at  start/end of session      Pertinent Vitals/Pain Pain Assessment: 0-10 Pain Score: 4  Pain Location: R hip Pain Descriptors / Indicators: Aching;Sore;Operative site guarding Pain Intervention(s): Limited activity within patient's tolerance;Monitored during session;Repositioned    Home Living Family/patient expects to be discharged to:: Private residence Living Arrangements: Spouse/significant other Available Help at Discharge: Family;Available 24 hours/day Type of Home: Mobile home Home Access: Stairs to enter Entrance Stairs-Rails: Can reach both Home Layout: One level Home Equipment: None      Prior Function Level of Independence: Independent      Comments: pt reports completely independent with ADLs/IADLs and just last week doing yard and house work   PT Goals (current goals can now be found in the care plan section) Acute Rehab PT Goals Patient Stated Goal: return to normal activities such as yard work Progress towards PT goals: Progressing toward goals    Frequency    BID      PT Plan Current plan remains appropriate    Co-evaluation              AM-PAC PT "6 Clicks" Mobility   Outcome Measure  Help needed turning from your back to your side while in a flat bed without using bedrails?: A Little Help needed moving from lying on your back to sitting on the side of a flat bed without using bedrails?: A Little Help needed moving to and from a bed to a chair (including a wheelchair)?: A Little Help needed standing up from a chair using your arms (e.g., wheelchair or bedside chair)?: A Little Help needed to walk in hospital room?: A Little Help needed climbing 3-5 steps with a railing? : A Little 6 Click Score: 18    End of Session Equipment Utilized During Treatment: Gait belt Activity Tolerance: Patient tolerated treatment well Patient left: in bed;with call bell/phone within reach;with bed alarm set;with SCD's reapplied;with family/visitor present Nurse  Communication: Mobility status PT Visit Diagnosis: Other abnormalities of gait and mobility (R26.89);Muscle weakness (generalized) (M62.81);Pain Pain - Right/Left: Right Pain - part of body: Hip     Time: 9539-6728 PT Time Calculation (min) (ACUTE ONLY): 22 min  Charges:  $Therapeutic Exercise: 8-22 mins                     Karoline Caldwell PT, DPT 2:49 PM,09/02/19 [phone]

## 2019-09-02 NOTE — Discharge Summary (Signed)
Physician Discharge Summary  Patient ID: Roy Iocco [facility] Sr. MRN: [id number] DOB/AGE: 06-30-1966 53 y.o.  Admit date: 09/01/2019 Discharge date: 09/03/2019 Admission Diagnoses:  PRIMARY OSTEOARTHRITIS OF RIGHT HIP   Discharge Diagnoses: Patient Active Problem List   Diagnosis Date Noted  . 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 Angioedema 07/24/2019  . MDD (major depressive disorder), recurrent episode (HCC) 03/23/2019  . MDD (major depressive disorder), recurrent episode, moderate (HCC) 02/19/2019  . MDD (major depressive disorder), recurrent severe, without psychosis (HCC) 02/19/2019  . Chest pain 05/27/2018  . Major depressive disorder, recurrent severe without psychotic features (HCC) 05/27/2018  . ARF (acute renal failure) (HCC) 04/08/2018  . Malingering 03/07/2017  . Cocaine dependence (HCC) 02/19/2017  . Substance induced mood disorder (HCC) 02/19/2017  . HIV disease (HCC) 10/26/2016  . HTN (hypertension) 10/26/2016  . Dyslipidemia 10/26/2016  . BPH (benign prostatic hyperplasia) 10/26/2016  . Constipation 10/26/2016    Past Medical History:  Diagnosis Date  . AIDS (acquired immune deficiency syndrome) (HCC)   . Arthritis   . Asthma   . Bipolar disorder (HCC)   . Bronchitis   . Complication of anesthesia   . Depression   . Dysrhythmia    1st degree heart block/ brady  . GERD (gastroesophageal reflux disease)   . Hepatitis C    treated  . HIV (human immunodeficiency virus infection) (HCC)   . HTN (hypertension)   . PONV (postoperative nausea and vomiting)      Transfusion: none   Consultants (if any):   Discharged Condition: Improved  Hospital Course: Roy Salvetti Sr. is an 53 y.o. male who was admitted 09/01/2019 with a diagnosis of right hip osteoarthritis and went to the operating room on 09/01/2019 and underwent the above named procedures.    Surgeries: Procedure(s): TOTAL HIP ARTHROPLASTY ANTERIOR APPROACH APPLICATION OF  WOUND VAC on 09/01/2019 Patient tolerated the surgery well. Taken to PACU where she was stabilized and then transferred to the orthopedic floor.  Started on Lovenox 40mg  q 24 hrs. Foot pumps applied bilaterally at 80 mm. Heels elevated on bed with rolled towels. No evidence of DVT. Negative Homan. Physical therapy started on day #1 for gait training and transfer. OT started day #1 for ADL and assisted devices.  [name] foley was d/c on day #1. [name] IV was d/c on day #2.  On post op day #2 patient was stable and ready for discharge to home with HHPT.  Implants: Medactra AMIS the standard stem, 56 mm Mpact DM cup and liner with M ceramic 28 mm head  He was given perioperative antibiotics:  Anti-infectives (From admission, onward)   Start     Dose/Rate Route Frequency Ordered Stop   09/02/19 1000  bictegravir-emtricitabine-tenofovir AF (BIKTARVY) 50-200-25 MG per tablet 1 tablet    Note to Pharmacy: For HIV     1 tablet Oral Daily 09/01/19 1715     09/01/19 2000  ceFAZolin (ANCEF) IVPB 2g/100 mL premix     2 g 200 mL/hr over 30 Minutes Intravenous Every 6 hours 09/01/19 1715 09/02/19 0144   09/01/19 1730  dolutegravir (TIVICAY) tablet 50 mg  Status:  Discontinued     50 mg Oral Daily 09/01/19 1715 09/02/19 1400   09/01/19 1017  ceFAZolin (ANCEF) 2-4 GM/100ML-% IVPB    Note to Pharmacy: Manfred Arch   : cabinet override      09/01/19 1017 09/01/19 2229   09/01/19 1015  ceFAZolin (ANCEF) IVPB 2g/100 mL premix  2 g 200 mL/hr over 30 Minutes Intravenous  Once 09/01/19 1010 09/01/19 1405    .  He was given sequential compression devices, early ambulation, and Lovenox, teds for DVT prophylaxis.  He benefited maximally from the hospital stay and there were no complications.    Recent vital signs:  Vitals:   09/02/19 2313 09/03/19 0737  BP: 126/81 (!) 129/93  Pulse: 80 82  Resp: 18   Temp: 98.8 F (37.1 C) 99.6 F (37.6 C)  SpO2: 94% 95%    Recent laboratory studies:   Lab Results  Component Value Date   HGB 12.5 (L) 09/02/2019   HGB 14.0 09/01/2019   HGB 15.0 08/27/2019   Lab Results  Component Value Date   WBC 9.0 09/02/2019   PLT 190 09/02/2019   Lab Results  Component Value Date   INR 1.0 08/27/2019   Lab Results  Component Value Date   NA 135 09/02/2019   K 4.1 09/02/2019   CL 107 09/02/2019   CO2 23 09/02/2019   BUN 18 09/02/2019   CREATININE 1.29 (H) 09/02/2019   GLUCOSE 132 (H) 09/02/2019    Discharge Medications:   Allergies as of 09/03/2019      Reactions   Amlodipine Swelling   Of the tongue   Lisinopril Swelling   Lactose Other (See Comments)   GI distress   Pollen Extract Other (See Comments)   Itchy eyes and runny nose      Medication List    TAKE these medications   albuterol 108 (90 Base) MCG/ACT inhaler Commonly known as: VENTOLIN HFA Inhale 1-2 puffs into the lungs every 4 (four) hours as needed for wheezing or shortness of breath.   amLODipine 10 MG tablet Commonly known as: NORVASC Take 1 tablet (10 mg total) by mouth daily.   atorvastatin 10 MG tablet Commonly known as: LIPITOR Take 1 tablet (10 mg total) by mouth daily at 6 PM. For high cholesterol   bictegravir-emtricitabine-tenofovir AF 50-200-25 MG Tabs tablet Commonly known as: Biktarvy Take 1 tablet by mouth daily. For HIV   citalopram 40 MG tablet Commonly known as: CELEXA Take 1 tablet (40 mg total) by mouth daily. For mood   enoxaparin 40 MG/0.4ML injection Commonly known as: LOVENOX Inject 0.4 mLs (40 mg total) into the skin daily for 14 days.   gabapentin 300 MG capsule Commonly known as: NEURONTIN Take 1 capsule (300 mg total) by mouth 2 (two) times daily.   lactase 3000 units tablet Commonly known as: LACTAID Take 1 tablet (3,000 Units total) by mouth 3 (three) times daily with meals.   oxyCODONE 5 MG immediate release tablet Commonly known as: Oxy IR/ROXICODONE Take 1-2 tablets (5-10 mg total) by mouth every 4 (four)  hours as needed for moderate pain (pain score 4-6).   traZODone 150 MG tablet Commonly known as: DESYREL Take 1 tablet (150 mg total) by mouth at bedtime as needed for sleep. What changed: additional instructions            Durable Medical Equipment  (From admission, onward)         Start     Ordered   09/01/19 1716  DME Bedside commode  Once    Question:  Patient needs a bedside commode to treat with the following condition  Answer:  Status post total hip replacement, right   09/01/19 1715   09/01/19 1716  DME Walker rolling  Once    Question:  Patient needs a walker to treat with  the following condition  Answer:  Status post total hip replacement, right   09/01/19 1715   09/01/19 1716  DME 3 n 1  Once     09/01/19 1715          Diagnostic Studies: Dg Hip Operative Unilat W Or W/o Pelvis Right  Result Date: 09/01/2019 CLINICAL DATA:  Right hip replacement EXAM: OPERATIVE right HIP (WITH PELVIS IF PERFORMED) 3 VIEWS TECHNIQUE: Fluoroscopic spot image(s) were submitted for interpretation post-operatively. COMPARISON:  06/08/2019 FINDINGS: Three low resolution intraoperative spot views of the right hip. Total fluoroscopy time was 30 seconds. The images demonstrate a right hip replacement with normal alignment. IMPRESSION: Intraoperative fluoroscopic assistance provided during right hip replacement surgery Electronically Signed   By: Jasmine PangKim  Fujinaga M.D.   On: 09/01/2019 16:26   Dg Hip Unilat W Or W/o Pelvis 2-3 Views Right  Result Date: 09/01/2019 CLINICAL DATA:  Followup right hip replacement EXAM: DG HIP (WITH OR WITHOUT PELVIS) 2-3V RIGHT COMPARISON:  Earlier same day FINDINGS: Bipolar hip replacement on the right appears well position without evidence operative complication. IMPRESSION: Good appearance following right hip arthroplasty. Electronically Signed   By: Paulina FusiMark  Shogry M.D.   On: 09/01/2019 16:36    Disposition:     Follow-up Information    Evon SlackGaines, Kashina Mecum C, PA-C  Follow up in 2 week(s).   Specialties: Orthopedic Surgery, Emergency Medicine Contact information: [address] [place] [address] [phone]            Signed: Patience MuscaGAINES, Charliee Krenz CHRISTOPHER 09/03/2019, 9:42 AM

## 2019-09-02 NOTE — TOC Initial Note (Addendum)
Transition of Care (TOC) - Initial/Assessment Note    Patient Details  Name: Roy Viney [facility] MRN: [id number] Date of Birth: 02-Oct-1966  Transition of [facility]) CM/SW Contact:    Arli Bree, Lenice Llamas Phone Number: [phone]  09/02/2019, 12:07 PM  Clinical Narrative: Clinical Social Worker ([place]) met with patient and his fiance Roy [name] was at bedside. PT is recommending home health. CSW introduced self and explained role of CSW department. Patient was alert and oriented X4 and was sitting up in the chair at bedside. Patient reported that him and his fiance Roy [name] live in a mobile home in [place]. Patient reported that the MD wrote him a letter stating that he needs a HVAC system. Patient reported that DSS gave him a fan however it is not working and he needs a Technical brewer. CSW explained that patient can try to reach out to the [place] or continue to work with [place]. Patient verbalized his understanding. CSW explained home health process and provided patient with CMS home health list. Patient does not have a home health agency preference. CSW made patient aware that [facility]. Per Helene Kelp [facility] representative they can accept patient. [facility] is agreeable to [facility]. Patient requested a rolling walker and bedside commode. Brad [employer] D[employer] agency representative is aware of above. Patient is aware that under medicaid his Lovenox should be $3. Patient verbalized his understanding and reported no other needs CSW will continue to follow and assist as needed.                 4:18 pm: Patient contacted the charitable foundation and requested an air conditioner window unit. CSW submitted charitable foundation request form. CSW left a voicemail for Martinique at [employer].  Patient reported that he receives SSI/ disability, [phone] per month. Patient reported that his fiance has a car and he has access to transportation. CSW made  patient aware that CSW submitted request for air conditioner window unit.   Expected Discharge Plan: [place] Barriers to Discharge: Continued Medical Work up   Patient Goals and CMS Choice Patient states their goals for this hospitalization and ongoing recovery are:: To go home. CMS Medicare.gov Compare Post Acute Care list provided to:: Patient Choice offered to / list presented to : Patient  Expected Discharge Plan and Services Expected Discharge Plan: [place] In-house Referral: Clinical Social Work   Post Acute Care Choice: [place] arrangements for the past 2 months: Mobile Home                 DME Arranged: Bedside commode, Walker rolling DME Agency: [employer] Date DME Agency Contacted: 09/02/19 Time DME Agency Contacted: [phone] Representative spoke with at DME Agency: [place]: PT [place]: [facility] [employer]) Date [place]: 09/02/19 Time [place]: [phone] Representative spoke with at [place]: [place] Arrangements/Services Living arrangements for the past 2 months: Mobile Home Lives with:: Significant Other Patient language and need for interpreter reviewed:: No Do you feel safe going back to the place where you live?: Yes      Need for Family Participation in Patient Care: Yes (Comment) Care giver support system in place?: Yes (comment)   Criminal Activity/Legal Involvement Pertinent to Current Situation/Hospitalization: No - Comment as needed  Activities of Daily Living Home Assistive Devices/Equipment: None ADL Screening (condition at time of admission) [name] cognitive ability  adequate to safely complete daily activities?: Yes Is the patient deaf or have difficulty hearing?: No Does the patient have difficulty seeing, even when wearing glasses/contacts?: No Does the patient have difficulty concentrating, remembering, or making decisions?:  No Patient able to express need for assistance with ADLs?: Yes Does the patient have difficulty dressing or bathing?: No Independently performs ADLs?: Yes (appropriate for developmental age) Does the patient have difficulty walking or climbing stairs?: Yes Weakness of Legs: Right Weakness of Arms/Hands: Left  Permission Sought/Granted Permission sought to share information with : Other (comment)(Home Health agency and DME agency) Permission granted to share information with : Yes, Verbal Permission Granted              Emotional Assessment Appearance:: Appears stated age Attitude/Demeanor/Rapport: Engaged Affect (typically observed): Pleasant, Calm Orientation: : Oriented to Self, Oriented to Place, Oriented to  Time, Oriented to Situation Alcohol / Substance Use: Not Applicable Psych Involvement: No (comment)  Admission diagnosis:  PRIMARY OSTEOARTHRITIS OF RIGHT HIP Patient Active Problem List   Diagnosis Date Noted  . 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 Angioedema 07/24/2019  . MDD (major depressive disorder), recurrent episode ([place]) 03/23/2019  . MDD (major depressive disorder), recurrent episode, moderate ([place]) 02/19/2019  . MDD (major depressive disorder), recurrent severe, without psychosis ([place]) 02/19/2019  . Chest pain 05/27/2018  . Major depressive disorder, recurrent severe without psychotic features ([place]) 05/27/2018  . ARF (acute renal failure) ([place]) 04/08/2018  . Malingering 03/07/2017  . Cocaine dependence ([place]) 02/19/2017  . Substance induced mood disorder ([place]) 02/19/2017  . HIV disease ([place]) 10/26/2016  . HTN (hypertension) 10/26/2016  . Dyslipidemia 10/26/2016  . BPH (benign prostatic hyperplasia) 10/26/2016  . Constipation 10/26/2016   PCP:  System, Pcp Not In Pharmacy:   [place], [place] [address] [place] [address] Phone: [phone] Fax:  [phone]  [id number] [place] [address] [id number]: [phone]: [phone]    Social Determinants of Health (SDOH) Interventions    Readmission Risk Interventions No flowsheet data found.

## 2019-09-02 NOTE — Progress Notes (Signed)
   Subjective: 1 Day Post-Op Procedure(s) (LRB): TOTAL HIP ARTHROPLASTY ANTERIOR APPROACH (Right) APPLICATION OF WOUND VAC (Right) Patient reports pain as 5 on 0-10 scale.   Patient is well, and has had no acute complaints or problems Denies any CP, SOB, ABD pain. We will continue therapy today.  Plan is to go Home after hospital stay.  Objective: Vital signs in last 24 hours: Temp:  [96.7 F (35.9 C)-99.1 F (37.3 C)] 98.5 F (36.9 C) (09/16 0744) Pulse Rate:  [43-97] 83 (09/16 0744) Resp:  [10-19] 16 (09/16 0442) BP: (92-175)/(63-118) 106/79 (09/16 0744) SpO2:  [95 %-100 %] 96 % (09/16 0744)  Intake/Output from previous day: 09/15 0701 - 09/16 0700 In: 1485.1 [I.V.:1285.1; IV Piggyback:200] Out: 1025 [Urine:625; Blood:400] Intake/Output this shift: No intake/output data recorded.  Recent Labs    09/01/19 1730 09/02/19 0417  HGB 14.0 12.5*   Recent Labs    09/01/19 1730 09/02/19 0417  WBC 4.6 9.0  RBC 4.21* 3.72*  HCT 41.2 36.3*  PLT 193 190   Recent Labs    09/01/19 1730 09/02/19 0417  NA  --  135  K  --  4.1  CL  --  107  CO2  --  23  BUN  --  18  CREATININE 1.29* 1.29*  GLUCOSE  --  132*  CALCIUM  --  8.2*   No results for input(s): LABPT, INR in the last 72 hours.  EXAM General - Patient is Alert, Appropriate and Oriented Extremity - Neurovascular intact Sensation intact distally Intact pulses distally Dorsiflexion/Plantar flexion intact No cellulitis present Compartment soft Dressing - dressing C/D/I and no drainage, prevena intact with out drainage Motor Function - intact, moving foot and toes well on exam.   Past Medical History:  Diagnosis Date  . AIDS (acquired immune deficiency syndrome) ([place])   . Arthritis   . Asthma   . Bipolar disorder ([place])   . Bronchitis   . Complication of anesthesia   . Depression   . Dysrhythmia    1st degree heart block/ brady  . GERD (gastroesophageal reflux disease)   . Hepatitis C    treated  .  HIV (human immunodeficiency virus infection) ([place])   . HTN (hypertension)   . PONV (postoperative nausea and vomiting)     Assessment/Plan:   1 Day Post-Op Procedure(s) (LRB): TOTAL HIP ARTHROPLASTY ANTERIOR APPROACH (Right) APPLICATION OF WOUND VAC (Right) Active Problems:   Status post total hip replacement, right  Estimated body mass index is 33.73 kg/m as calculated from the following:   Height as of 08/27/19: 5\' 9"  (1.753 m).   Weight as of 08/27/19: 103.6 kg. Advance diet Up with therapy  Needs BM Labs and VS stable Pain controlled CM to assist with discharge  DVT Prophylaxis - Lovenox, TED hose and SCDs Weight-Bearing as tolerated to right leg   T. Rachelle Hora, PA-C [place] 09/02/2019, 8:01 AM

## 2019-09-03 MED ORDER — BISACODYL 10 MG RE SUPP
10.0000 mg | Freq: Once | RECTAL | Status: DC
  Filled 2019-09-03: qty 1

## 2019-09-03 NOTE — Progress Notes (Signed)
Physical Therapy Treatment Patient Details Name: Roy [name] [facility] Sr. MRN: [id number] DOB: 1966/11/22 Today's Date: 09/03/2019    History of Present Illness 53 yo male s/p R THR on 09/01/19. PMH includes HTN, dysrhythmia, HIV, AIDs, hep C    PT Comments    Pt showed good overall confidence, mobility and safety t/o the PT session.  He was able to perform most exercises with some resistance, did not have excessive pain t/o the session, was able to do bed mobility w/o direct assist and showed improvement with consistency, cadence, confidence with ambulation and stairs with cuing. Overall pt doing well and should be safe to return home later today as planned.   Follow Up Recommendations  Home health PT     Equipment Recommendations  Rolling walker with 5" wheels;3in1 (PT)    Recommendations for Other Services       Precautions / Restrictions Precautions Precautions: Fall;Anterior Hip Precaution Booklet Issued: Yes (comment) Restrictions Weight Bearing Restrictions: Yes RLE Weight Bearing: Weight bearing as tolerated    Mobility  Bed Mobility Overal bed mobility: Needs Assistance Bed Mobility: Supine to Sit;Sit to Supine     Supine to sit: Modified independent (Device/Increase time)     General bed mobility comments: Pt able to get himself to EOB w/o issue  Transfers Overall transfer level: Modified independent Equipment used: Rolling walker (2 wheeled) Transfers: Sit to/from Stand Sit to Stand: Supervision         General transfer comment: Pt able to rise with very little cuing, able to rise w/o assist  Ambulation/Gait Ambulation/Gait assistance: Min guard Gait Distance (Feet): 250 Feet Assistive device: Rolling walker (2 wheeled)       General Gait Details: Pt is able to ambulate with good safety and consistent, solid effort. Pt was able to easily and confidently circumambulate the nurses' station; with increased distance was able to increase speed and  cadence with decreased UE reliance.    Stairs Stairs: Yes Stairs assistance: Modified independent (Device/Increase time) Stair Management: Two rails Number of Stairs: 4 General stair comments: Pt able to negotiate up/down steps without direct physical assist.  No direct assist needed.   Wheelchair Mobility    Modified Rankin (Stroke Patients Only)       Balance Overall balance assessment: Needs assistance Sitting-balance support: Feet supported Sitting balance-Leahy Scale: Good Sitting balance - Comments: steady sitting EOB and performing LE exercises   Standing balance support: Bilateral upper extremity supported;During functional activity Standing balance-Leahy Scale: Good                              Cognition Arousal/Alertness: Awake/alert Behavior During Therapy: WFL for tasks assessed/performed Overall Cognitive Status: Within Functional Limits for tasks assessed                                        Exercises Total Joint Exercises Ankle Circles/Pumps: AROM;10 reps Quad Sets: Strengthening;15 reps Short Arc Quad: Strengthening;15 reps Heel Slides: Strengthening;10 reps Hip ABduction/ADduction: Strengthening;10 reps Straight Leg Raises: AROM;10 reps    General Comments        Pertinent Vitals/Pain Pain Assessment: 0-10 Pain Score: 2     Home Living                      Prior Function  PT Goals (current goals can now be found in the care plan section) Progress towards PT goals: Progressing toward goals    Frequency    BID      PT Plan Current plan remains appropriate    Co-evaluation              AM-PAC PT "6 Clicks" Mobility   Outcome Measure  Help needed turning from your back to your side while in a flat bed without using bedrails?: A Little Help needed moving from lying on your back to sitting on the side of a flat bed without using bedrails?: A Little Help needed moving to and  from a bed to a chair (including a wheelchair)?: None Help needed standing up from a chair using your arms (e.g., wheelchair or bedside chair)?: None Help needed to walk in hospital room?: None Help needed climbing 3-5 steps with a railing? : A Little 6 Click Score: 21    End of Session Equipment Utilized During Treatment: Gait belt Activity Tolerance: Patient tolerated treatment well Patient left: with chair alarm set;with call bell/phone within reach Nurse Communication: Mobility status PT Visit Diagnosis: Other abnormalities of gait and mobility (R26.89);Muscle weakness (generalized) (M62.81);Pain Pain - Right/Left: Right Pain - part of body: Hip     Time: 1117-3567 PT Time Calculation (min) (ACUTE ONLY): 44 min  Charges:  $Gait Training: 8-22 mins $Therapeutic Exercise: 23-37 mins                     Malachi Pro, DPT 09/03/2019, 11:35 AM

## 2019-09-03 NOTE — Progress Notes (Signed)
Discharge education given to patient and family. IV removed and wound vac changed to handheld.

## 2019-09-03 NOTE — Progress Notes (Signed)
   Subjective: 2 Days Post-Op Procedure(s) (LRB): TOTAL HIP ARTHROPLASTY ANTERIOR APPROACH (Right) APPLICATION OF WOUND VAC (Right) Patient reports pain as 5 on 0-10 scale.   Patient is well, and has had no acute complaints or problems Denies any CP, SOB, ABD pain. We will continue therapy today.  Plan is to go Home after hospital stay.  Objective: Vital signs in last 24 hours: Temp:  [98.2 F (36.8 C)-99.6 F (37.6 C)] 99.6 F (37.6 C) (09/17 0737) Pulse Rate:  [55-82] 82 (09/17 0737) Resp:  [18] 18 (09/16 2313) BP: (116-129)/(71-93) 129/93 (09/17 0737) SpO2:  [94 %-98 %] 95 % (09/17 0737)  Intake/Output from previous day: 09/16 0701 - 09/17 0700 In: 240 [P.O.:240] Out: 975 [Urine:975] Intake/Output this shift: Total I/O In: -  Out: 200 [Urine:200]  Recent Labs    09/01/19 1730 09/02/19 0417  HGB 14.0 12.5*   Recent Labs    09/01/19 1730 09/02/19 0417  WBC 4.6 9.0  RBC 4.21* 3.72*  HCT 41.2 36.3*  PLT 193 190   Recent Labs    09/01/19 1730 09/02/19 0417  NA  --  135  K  --  4.1  CL  --  107  CO2  --  23  BUN  --  18  CREATININE 1.29* 1.29*  GLUCOSE  --  132*  CALCIUM  --  8.2*   No results for input(s): LABPT, INR in the last 72 hours.  EXAM General - Patient is Alert, Appropriate and Oriented Extremity - Neurovascular intact Sensation intact distally Intact pulses distally Dorsiflexion/Plantar flexion intact No cellulitis present Compartment soft Dressing - dressing C/D/I and no drainage, prevena intact with out drainage Motor Function - intact, moving foot and toes well on exam.   Past Medical History:  Diagnosis Date  . AIDS (acquired immune deficiency syndrome) ([place])   . Arthritis   . Asthma   . Bipolar disorder ([place])   . Bronchitis   . Complication of anesthesia   . Depression   . Dysrhythmia    1st degree heart block/ brady  . GERD (gastroesophageal reflux disease)   . Hepatitis C    treated  . HIV (human immunodeficiency  virus infection) ([place])   . HTN (hypertension)   . PONV (postoperative nausea and vomiting)     Assessment/Plan:   2 Days Post-Op Procedure(s) (LRB): TOTAL HIP ARTHROPLASTY ANTERIOR APPROACH (Right) APPLICATION OF WOUND VAC (Right) Active Problems:   Status post total hip replacement, right  Estimated body mass index is 33.73 kg/m as calculated from the following:   Height as of 08/27/19: 5\' 9"  (1.753 m).   Weight as of 08/27/19: 103.6 kg. Advance diet Up with therapy  Discharge home today with HHPT pending BM  DVT Prophylaxis - Lovenox, TED hose and SCDs Weight-Bearing as tolerated to right leg   T. Rachelle Hora, PA-C [place] 09/03/2019, 9:43 AM

## 2019-09-03 NOTE — TOC Transition Note (Addendum)
Transition of Care [facility]) - CM/SW Discharge Note   Patient Details  Name: Roy [name]. MRN: [id number] Date of Birth: 24-Aug-1966  Transition of Care [facility]) CM/SW Contact:  Aydenn Gervin, Lenice Llamas Phone Number: [phone]  09/03/2019, 11:19 AM   Clinical Narrative: Clinical Social Worker ([place]) received a call this morning from Martinique Wood at [facility]. Martinique requested that CSW provide her with patient's mailing address, size of the air condition window unit needed and who is going to install the window unit. CSW met with patient and made him aware of above. Patient reported that his address is [place], [place], [place] [address] and he needs a wat size 110 and it is a regular size window. Per patient his fiance [name] son [name] will install the window unit. CSW sent [place]ique an email making her aware of above. Per [place] the window unit will be delivered to patient's home on 9/26. CSW made patient and his fiance [name] aware of above.   Patient is agreeable to D/C home today with [facility]. Helene Kelp [facility] representative is aware of above and is aware of patient's address. [employer] D[employer] agency representative will deliver rolling walker and bedside commode to patient's room today prior to D/C. Patient is aware of his Lovenox should be $3 under medicaid. RN aware of above. Please reconsult if future social work needs arise. CSW signing off.     Final next level of care: [place] Barriers to Discharge: Barriers Resolved   Patient Goals and CMS Choice Patient states their goals for this hospitalization and ongoing recovery are:: To go home. CMS Medicare.gov Compare Post Acute Care list provided to:: Patient Choice offered to / list presented to : Patient  Discharge Placement                       Discharge Plan and Services In-house Referral: Clinical Social Work   Post Acute Care Choice: Home  Health          DME Arranged: Bedside commode, Walker rolling DME Agency: [employer] Date DME Agency Contacted: 09/03/19 Time DME Agency Contacted: [phone] Representative spoke with at DME Agency: [place]: PT [place]: [facility] [employer] Date [place]: 09/03/19 Time [place]: [phone] Representative spoke with at [place]: [place] ([place]) Interventions     Readmission Risk Interventions No flowsheet data found.

## 2019-09-08 ENCOUNTER — Institutional Professional Consult (permissible substitution): Admit: 2019-09-08 | Discharge: 2019-09-09 | Payer: MEDICAID | Attending: Registered" | Primary: Registered"

## 2019-09-18 LAB — SURGICAL PATHOLOGY

## 2019-09-21 ENCOUNTER — Other Ambulatory Visit: Payer: Self-pay

## 2019-09-21 DIAGNOSIS — Z20822 Contact with and (suspected) exposure to covid-19: Secondary | ICD-10-CM

## 2019-09-23 LAB — NOVEL CORONAVIRUS, NAA: SARS-CoV-2, NAA: NOT DETECTED

## 2019-10-13 ENCOUNTER — Institutional Professional Consult (permissible substitution): Admit: 2019-10-13 | Discharge: 2019-10-14 | Payer: MEDICAID | Attending: Registered" | Primary: Registered"

## 2019-11-16 ENCOUNTER — Ambulatory Visit: Admit: 2019-11-16 | Discharge: 2019-11-17 | Payer: MEDICAID | Attending: Registered" | Primary: Registered"

## 2019-12-16 ENCOUNTER — Ambulatory Visit: Admit: 2019-12-16 | Discharge: 2019-12-17 | Payer: MEDICAID | Attending: Registered" | Primary: Registered"

## 2019-12-16 DIAGNOSIS — E669 Obesity, unspecified: Principal | ICD-10-CM

## 2019-12-30 DIAGNOSIS — R062 Wheezing: Principal | ICD-10-CM

## 2019-12-30 DIAGNOSIS — R0981 Nasal congestion: Principal | ICD-10-CM

## 2019-12-30 MED ORDER — ALBUTEROL SULFATE HFA 90 MCG/ACTUATION AEROSOL INHALER
0 refills | 0 days | Status: CP
Start: 2019-12-30 — End: ?

## 2020-01-05 ENCOUNTER — Ambulatory Visit: Admit: 2020-01-05 | Discharge: 2020-01-06 | Payer: MEDICAID | Attending: Family | Primary: Family

## 2020-01-15 ENCOUNTER — Institutional Professional Consult (permissible substitution): Admit: 2020-01-15 | Discharge: 2020-01-16 | Payer: MEDICAID | Attending: Registered" | Primary: Registered"

## 2020-02-01 ENCOUNTER — Emergency Department: Payer: Medicaid Other

## 2020-02-01 ENCOUNTER — Other Ambulatory Visit: Payer: Self-pay

## 2020-02-01 ENCOUNTER — Emergency Department
Admission: EM | Admit: 2020-02-01 | Discharge: 2020-02-01 | Disposition: A | Discharge status: Home / Self Care | Payer: Medicaid Other | Attending: Student in an Organized Health Care Education/Training Program | Admitting: Student in an Organized Health Care Education/Training Program

## 2020-02-01 DIAGNOSIS — F17210 Nicotine dependence, cigarettes, uncomplicated: Secondary | ICD-10-CM | POA: Diagnosis not present

## 2020-02-01 DIAGNOSIS — R0789 Other chest pain: Secondary | ICD-10-CM | POA: Diagnosis present

## 2020-02-01 DIAGNOSIS — Z79899 Other long term (current) drug therapy: Secondary | ICD-10-CM | POA: Diagnosis not present

## 2020-02-01 DIAGNOSIS — Z96641 Presence of right artificial hip joint: Secondary | ICD-10-CM | POA: Diagnosis not present

## 2020-02-01 DIAGNOSIS — U071 COVID-19: Secondary | ICD-10-CM | POA: Diagnosis not present

## 2020-02-01 DIAGNOSIS — J45909 Unspecified asthma, uncomplicated: Secondary | ICD-10-CM | POA: Diagnosis not present

## 2020-02-01 DIAGNOSIS — I10 Essential (primary) hypertension: Secondary | ICD-10-CM | POA: Insufficient documentation

## 2020-02-01 DIAGNOSIS — B20 Human immunodeficiency virus [HIV] disease: Secondary | ICD-10-CM | POA: Diagnosis not present

## 2020-02-01 DIAGNOSIS — M791 Myalgia, unspecified site: Secondary | ICD-10-CM

## 2020-02-01 LAB — TROPONIN I (HIGH SENSITIVITY)
Troponin I (High Sensitivity): 11 ng/L (ref <18)
Troponin I (High Sensitivity): 9 ng/L (ref <18)

## 2020-02-01 LAB — BASIC METABOLIC PANEL
Anion gap: 9 (ref 5–15)
BUN: 28 mg/dL — ABNORMAL HIGH (ref 6–20)
CO2: 22 mmol/L (ref 22–32)
Calcium: 9.1 mg/dL (ref 8.9–10.3)
Chloride: 107 mmol/L (ref 98–111)
Creatinine, Ser: 1.2 mg/dL (ref 0.61–1.24)
GFR calc Af Amer: >60 mL/min (ref >60)
GFR calc non Af Amer: >60 mL/min (ref >60)
Glucose, Bld: 108 mg/dL — ABNORMAL HIGH (ref 70–99)
Potassium: 3.8 mmol/L (ref 3.5–5.1)
Sodium: 138 mmol/L (ref 135–145)

## 2020-02-01 LAB — CBC
HCT: 44.6 % (ref 39.0–52.0)
Hemoglobin: 15 g/dL (ref 13.0–17.0)
MCH: 32.8 pg (ref 26.0–34.0)
MCHC: 33.6 g/dL (ref 30.0–36.0)
MCV: 97.4 fL (ref 80.0–100.0)
Platelets: 186 10*3/uL (ref 150–400)
RBC: 4.58 MIL/uL (ref 4.22–5.81)
RDW: 13.2 % (ref 11.5–15.5)
WBC: 2.8 10*3/uL — ABNORMAL LOW (ref 4.0–10.5)
nRBC: 0 % (ref 0.0–0.2)

## 2020-02-01 LAB — POC SARS CORONAVIRUS 2 AG: SARS Coronavirus 2 Ag: POSITIVE — AB

## 2020-02-01 LAB — FIBRIN DERIVATIVES D-DIMER (ARMC ONLY): Fibrin derivatives D-dimer (ARMC): 398.37 ng/mL (FEU) (ref 0.00–499.00)

## 2020-02-01 MED ORDER — OXYCODONE-ACETAMINOPHEN 5-325 MG PO TABS
1.0000 | ORAL_TABLET | Freq: Once | ORAL | Status: AC
  Administered 2020-02-01: 1 via ORAL
  Filled 2020-02-01: qty 1

## 2020-02-01 MED ORDER — SODIUM CHLORIDE 0.9 % IV BOLUS
500.0000 mL | Freq: Once | INTRAVENOUS | Status: AC
  Administered 2020-02-01: 500 mL via INTRAVENOUS

## 2020-02-01 MED ORDER — SODIUM CHLORIDE 0.9% FLUSH: 3.0000 mL | Freq: Once | INTRAVENOUS | Status: DC

## 2020-02-01 MED ORDER — ASPIRIN 81 MG PO CHEW
324.0000 mg | CHEWABLE_TABLET | Freq: Once | ORAL | Status: AC
  Administered 2020-02-01: 324 mg via ORAL
  Filled 2020-02-01: qty 4

## 2020-02-01 MED ORDER — HYDROCODONE-ACETAMINOPHEN 5-325 MG PO TABS: 1.0000 | ORAL_TABLET | ORAL | 0 refills | Status: DC | PRN

## 2020-02-01 NOTE — ED Provider Notes (Signed)
[facility] Emergency Department Provider Note    First MD Initiated Contact with Patient 02/01/20 1352     (approximate)  I have reviewed the triage vital signs and the nursing notes.   HISTORY  Chief Complaint Chest Pain    HPI Roy Noah Sr. is a 54 y.o. male the below listed past medical history presents the ER for evaluation of chest pain, myalgias, headache, sore throat, cough generalized malaise over past 24 hours.  States he did wake up with night sweats last night.  States his girlfriend just got out of the hospital after a 1 week stay for COVID-19.  Denies any exertional discomfort.  States it does hurt to take deep breath.  He does still smoke.  Has not had any nausea or vomiting but has had decreased p.o. intake.    Past Medical History:  Diagnosis Date  . AIDS (acquired immune deficiency syndrome) (HCC)   . Arthritis   . Asthma   . Bipolar disorder (HCC)   . Bronchitis   . Complication of anesthesia   . Depression   . Dysrhythmia    1st degree heart block/ brady  . GERD (gastroesophageal reflux disease)   . Hepatitis C    treated  . HIV (human immunodeficiency virus infection) (HCC)   . HTN (hypertension)   . PONV (postoperative nausea and vomiting)    Family History  Problem Relation Age of Onset  . Cancer Brother   . Uterine cancer Mother   . CAD Mother   . Hypertension Mother   . Hyperlipidemia Mother    Past Surgical History:  Procedure Laterality Date  . APPLICATION OF WOUND VAC Right 09/01/2019   Procedure: APPLICATION OF WOUND VAC;  Surgeon: Kennedy Bucker, MD;  Location: [facility] ORS;  Service: Orthopedics;  Laterality: Right;  Serial # [id number]  . HERNIA REPAIR Left    inguinal  . TOE SURGERY    . TOE SURGERY Right   . TOTAL HIP ARTHROPLASTY Right 09/01/2019   Procedure: TOTAL HIP ARTHROPLASTY ANTERIOR APPROACH;  Surgeon: Kennedy Bucker, MD;  Location: [facility] ORS;  Service: Orthopedics;  Laterality: Right;    Patient Active Problem List   Diagnosis Date Noted  . 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 Angioedema 07/24/2019  . MDD (major depressive disorder), recurrent episode (HCC) 03/23/2019  . MDD (major depressive disorder), recurrent episode, moderate (HCC) 02/19/2019  . MDD (major depressive disorder), recurrent severe, without psychosis (HCC) 02/19/2019  . Chest pain 05/27/2018  . Major depressive disorder, recurrent severe without psychotic features (HCC) 05/27/2018  . ARF (acute renal failure) (HCC) 04/08/2018  . Malingering 03/07/2017  . Cocaine dependence (HCC) 02/19/2017  . Substance induced mood disorder (HCC) 02/19/2017  . HIV disease (HCC) 10/26/2016  . HTN (hypertension) 10/26/2016  . Dyslipidemia 10/26/2016  . BPH (benign prostatic hyperplasia) 10/26/2016  . Constipation 10/26/2016      Prior to Admission medications   Medication Sig Start Date End Date Taking? Authorizing Provider  albuterol (PROVENTIL HFA;VENTOLIN HFA) 108 (90 Base) MCG/ACT inhaler Inhale 1-2 puffs into the lungs every 4 (four) hours as needed for wheezing or shortness of breath. 03/26/19   Aldean Baker, NP  amLODipine (NORVASC) 10 MG tablet Take 1 tablet (10 mg total) by mouth daily. 07/25/19   Houston Siren, MD  atorvastatin (LIPITOR) 10 MG tablet Take 1 tablet (10 mg total) by mouth daily at 6 PM. For high cholesterol 03/26/19   Aldean Baker, NP  bictegravir-emtricitabine-tenofovir AF (BIKTARVY) 50-200-25 MG TABS tablet Take 1 tablet by mouth daily. For HIV 03/27/19   Connye Burkitt, NP  citalopram (CELEXA) 40 MG tablet Take 1 tablet (40 mg total) by mouth daily. For mood 03/27/19   Connye Burkitt, NP  enoxaparin (LOVENOX) 40 MG/0.4ML injection Inject 0.4 mLs (40 mg total) into the skin daily for 14 days. 09/03/19 09/17/19  Duanne Guess, PA-C  gabapentin (NEURONTIN) 300 MG capsule Take 1 capsule (300 mg total) by mouth 2 (two) times daily. 03/26/19   Connye Burkitt, NP   HYDROcodone-acetaminophen (NORCO) 5-325 MG tablet Take 1 tablet by mouth every 4 (four) hours as needed for moderate pain. 02/01/20   Merlyn Lot, MD  lactase (LACTAID) 3000 units tablet Take 1 tablet (3,000 Units total) by mouth 3 (three) times daily with meals. 03/26/19   Connye Burkitt, NP  oxyCODONE (OXY IR/ROXICODONE) 5 MG immediate release tablet Take 1-2 tablets (5-10 mg total) by mouth every 4 (four) hours as needed for moderate pain (pain score 4-6). 09/02/19   Duanne Guess, PA-C  traZODone (DESYREL) 150 MG tablet Take 1 tablet (150 mg total) by mouth at bedtime as needed for sleep. Patient taking differently: Take 150 mg by mouth at bedtime as needed for sleep. Takes 300 mg at bedtime 03/26/19   Connye Burkitt, NP    Allergies Amlodipine, Lisinopril, Lactose, and Pollen extract    Social History Social History   Tobacco Use  . Smoking status: Current Every Day Smoker    Packs/day: 0.25    Types: Cigarettes  . Smokeless tobacco: Never Used  Substance Use Topics  . Alcohol use: Yes    Alcohol/week: 84.0 standard drinks    Types: 84 Cans of beer per week    Comment: daily  . Drug use: Yes    Types: Cocaine    Comment: in the last 6 months    Review of Systems Patient denies headaches, rhinorrhea, blurry vision, numbness, shortness of breath, chest pain, edema, cough, abdominal pain, nausea, vomiting, diarrhea, dysuria, fevers, rashes or hallucinations unless otherwise stated above in HPI. ____________________________________________   PHYSICAL EXAM:  VITAL SIGNS: Vitals:   02/01/20 1430 02/01/20 1500  BP: 136/88 (!) 138/91  Pulse: (!) 50 (!) 45  Resp: 15 12  Temp:    SpO2: 98% 98%    Constitutional: Alert and oriented.  Eyes: Conjunctivae are normal.  Head: Atraumatic. Nose: No congestion/rhinnorhea. Mouth/Throat: Mucous membranes are moist.   Neck: No stridor. Painless ROM.  Cardiovascular: Normal rate, regular rhythm. Grossly normal heart sounds.   Good peripheral circulation. Respiratory: Normal respiratory effort.  No retractions. Lungs CTAB. Gastrointestinal: Soft and nontender. No distention. No abdominal bruits. No CVA tenderness. Genitourinary:  Musculoskeletal: No lower extremity tenderness nor edema.  No joint effusions. Neurologic:  Normal speech and language. No gross focal neurologic deficits are appreciated. No facial droop Skin:  Skin is warm, dry and intact. No rash noted. Psychiatric: Mood and affect are normal. Speech and behavior are normal.  ____________________________________________   LABS (all labs ordered are listed, but only abnormal results are displayed)  Results for orders placed or performed during the hospital encounter of 02/01/20 (from the past 24 hour(s))  Basic metabolic panel     Status: Abnormal   Collection Time: 02/01/20 12:37 PM  Result Value Ref Range   Sodium 138 135 - 145 mmol/L   Potassium 3.8 3.5 - 5.1 mmol/L   Chloride 107 98 - 111 mmol/L  CO2 22 22 - 32 mmol/L   Glucose, Bld 108 (H) 70 - 99 mg/dL   BUN 28 (H) 6 - 20 mg/dL   Creatinine, Ser 1.06 0.61 - 1.24 mg/dL   Calcium 9.1 8.9 - 26.9 mg/dL   GFR calc non Af Amer >60 >60 mL/min   GFR calc Af Amer >60 >60 mL/min   Anion gap 9 5 - 15  CBC     Status: Abnormal   Collection Time: 02/01/20 12:37 PM  Result Value Ref Range   WBC 2.8 (L) 4.0 - 10.5 K/uL   RBC 4.58 4.22 - 5.81 MIL/uL   Hemoglobin 15.0 13.0 - 17.0 g/dL   HCT 48.5 46.2 - 70.3 %   MCV 97.4 80.0 - 100.0 fL   MCH 32.8 26.0 - 34.0 pg   MCHC 33.6 30.0 - 36.0 g/dL   RDW 50.0 93.8 - 18.2 %   Platelets 186 150 - 400 K/uL   nRBC 0.0 0.0 - 0.2 %  Troponin I (High Sensitivity)     Status: None   Collection Time: 02/01/20 12:37 PM  Result Value Ref Range   Troponin I (High Sensitivity) 11 <18 ng/L  Fibrin derivatives D-Dimer ([facility] only)     Status: None   Collection Time: 02/01/20  2:01 PM  Result Value Ref Range   Fibrin derivatives D-dimer ([facility]) 398.37 0.00 - 499.00  ng/mL (FEU)  Troponin I (High Sensitivity)     Status: None   Collection Time: 02/01/20  2:39 PM  Result Value Ref Range   Troponin I (High Sensitivity) 9 <18 ng/L  POC SARS Coronavirus 2 Ag     Status: Abnormal   Collection Time: 02/01/20  3:25 PM  Result Value Ref Range   SARS Coronavirus 2 Ag POSITIVE (A) NEGATIVE   ____________________________________________  EKG My review and personal interpretation at Time: 12:26   Indication: chest pain  Rate: 60  Rhythm: sinus Axis: normal Other: normal intervals, no stemi,  New inferolateral t wave inversions as compared to previous  ____________________________________________  RADIOLOGY  I personally reviewed all radiographic images ordered to evaluate for the above acute complaints and reviewed radiology reports and findings.  These findings were personally discussed with the patient.  Please see medical record for radiology report.  ____________________________________________   PROCEDURES  Procedure(s) performed:  Procedures    Critical Care performed: no ____________________________________________   INITIAL IMPRESSION / ASSESSMENT AND PLAN / ED COURSE  Pertinent labs & imaging results that were available during my care of the patient were reviewed by me and considered in my medical decision making (see chart for details).   DDX: covid 19, acs, dissection, pe, flu like illness, pna  Roy [employer] Sr. is a 54 y.o. who presents to the ED with flulike illness symptoms as described above.  I do have a high suspicion this is COVID-19 related.  Has been having some vague chest discomfort during this recent illness over the past few days.  Sounds like he is also been having some fevers and chills.  Abdominal exam is soft and benign.  EKG does have some nonspecific T wave changes as compared to previous.  Symptoms are otherwise atypical.  Will order serial enzymes but have a lower suspicion for ACS or dissection.  Will order  D-dimer to further restratify as I do have a high clinical suspicion that his symptoms are secondary to Covid.  Clinical Course as of Jan 31 1622  Mon Feb 01, 2020  1521 Patient  is Covid positive.  Symptoms significantly improved.  No Hypoxia.  Remains well appearing clinically.  D-dimer is normal.   [PR]  1613 Repeat troponin negative.  This does not seem consistent with ACS.  His symptoms seem to be flulike illness related and are consistent with COVID-19 however fortunately he is not hypoxic and does not have infiltrates on his chest x-ray right now. I will give him a short course of pain medication to be taken as needed. I instructed him to take his albuterol inhaler at home as needed for wheezing.  We also discussed taking a daily aspirin. He is quite well appearing clinically. I think he would be a good candidate for referral to the infusion center however given his comorbidities.  He is otherwise been compliant with his medications.  Discussed signs and symptoms for which he should return to the ER.    [PR]    Clinical Course User Index [PR] Willy Eddy, MD    The patient was evaluated in Emergency Department today for the symptoms described in the history of present illness. He/she was evaluated in the context of the global COVID-19 pandemic, which necessitated consideration that the patient might be at risk for infection with the SARS-CoV-2 virus that causes COVID-19. Institutional protocols and algorithms that pertain to the evaluation of patients at risk for COVID-19 are in a state of rapid change based on information released by regulatory bodies including the CDC and federal and state organizations. These policies and algorithms were followed during the patient's care in the ED.  As part of my medical decision making, I reviewed the following data within the electronic MEDICAL RECORD NUMBER Nursing notes reviewed and incorporated, Labs reviewed, notes from prior ED visits and [place]  Controlled Substance Database   ____________________________________________   FINAL CLINICAL IMPRESSION(S) / ED DIAGNOSES  Final diagnoses:  Generalized muscle ache  COVID-19 virus infection      NEW MEDICATIONS STARTED DURING THIS VISIT:  New Prescriptions   HYDROCODONE-ACETAMINOPHEN (NORCO) 5-325 MG TABLET    Take 1 tablet by mouth every 4 (four) hours as needed for moderate pain.     Note:  This document was prepared using Dragon voice recognition software and may include unintentional dictation errors.    Willy Eddy, MD 02/01/20 1623

## 2020-02-01 NOTE — ED Notes (Signed)
Given crackers and more drink

## 2020-02-01 NOTE — ED Triage Notes (Signed)
Patient presents to the ED with chest pain that began at 2am today and woke him up out of sleep.  Patient reports diaphoresis with the chest pain.  Patient reports his girlfriend has Covid19 and he has been exposed to her.  Patient states he has had cough and congestion x 3 days with body aches.

## 2020-02-02 ENCOUNTER — Telehealth: Payer: Self-pay | Admitting: Nurse Practitioner

## 2020-02-02 ENCOUNTER — Telehealth: Payer: Self-pay | Admitting: Adult Health

## 2020-02-02 ENCOUNTER — Telehealth: Payer: Self-pay | Admitting: Physician Assistant

## 2020-02-02 NOTE — Telephone Encounter (Addendum)
Called todiscuss with Roy Liter Aguinaga Sr. about SARS-CoV-2 positive test, COVID symptoms and the use of bamlanivimab, a monoclonal antibody infusion for those with mild to moderate Covid symptoms and at a high risk of hospitalization.   Pt is qualified for this infusion at the [facility] infusion center due to co-morbid conditions and/or a member of an at-risk group.  Unable to reach patient. Mobile number is invalid.  Home number goes straight to voicemail- which is not set-up. MyChart is not active  Will attempt contact at later time.  Per ED note 02/01/2020- sx onset 01/31/2020  William Hamburger, NP-C

## 2020-02-02 NOTE — Telephone Encounter (Signed)
Called to discuss with patient about Covid symptoms and the use of bamlanivimab or casirivimab/imdevimab, a monoclonal antibody infusion for those with mild to moderate Covid symptoms and at a high risk of hospitalization.  Pt is qualified for this infusion at the [facility] infusion center due to immuno-compromised state.   Not able to leave a message on numbers listed and no answer. We will try back again later.   Cline Crock PA-C  [facility]

## 2020-02-02 NOTE — Telephone Encounter (Signed)
Called to discuss with Roy Register Sr. about Covid symptoms and the use of bamlanivimab, a monoclonal antibody infusion for those with mild to moderate Covid symptoms and at a high risk of hospitalization.     Pt is qualified for this infusion at the [facility] infusion center due to co-morbid conditions and/or a member of an at-risk group.  Unable to reach patient. Mobile number is invalid. Home number goes straight to voicemail message indicating it is not set-up. Patient does not have MyChart active.   Patient advised to go to Urgent care or ED with severe symptoms. Last date he would be eligible for infusion is based on chart review and recent ED visit 02/09/20.    Patient Active Problem List   Diagnosis Date Noted  . 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 Angioedema 07/24/2019  . MDD (major depressive disorder), recurrent episode (HCC) 03/23/2019  . MDD (major depressive disorder), recurrent episode, moderate (HCC) 02/19/2019  . MDD (major depressive disorder), recurrent severe, without psychosis (HCC) 02/19/2019  . Chest pain 05/27/2018  . Major depressive disorder, recurrent severe without psychotic features (HCC) 05/27/2018  . ARF (acute renal failure) (HCC) 04/08/2018  . Malingering 03/07/2017  . Cocaine dependence (HCC) 02/19/2017  . Substance induced mood disorder (HCC) 02/19/2017  . HIV disease (HCC) 10/26/2016  . HTN (hypertension) 10/26/2016  . Dyslipidemia 10/26/2016  . BPH (benign prostatic hyperplasia) 10/26/2016  . Constipation 10/26/2016    Willette Alma, AGPCNP-BC Pager: [phone] Amion: Thea Alken

## 2020-02-04 ENCOUNTER — Telehealth: Payer: Self-pay | Admitting: Adult Health

## 2020-02-04 NOTE — Telephone Encounter (Signed)
Called todiscuss withDanny Avon Products Sr.about SARS-CoV-2 positive test, COVID symptoms and the use of bamlanivimab, a monoclonal antibody infusion for those with mild to moderate Covid symptoms and at a high risk of hospitalization.   Pt is qualified for this infusion at the [facility] infusion center due to co-morbid conditions and/or a member of an at-risk group.  Unable to reach patient.  Mobile number is invalid.  Home number goes straight to voicemail- which is not set-up. MyChart is not active Called cell number provided from Sheran Luz, RN [phone]- per her note is her cell at the [facility] that he is currently residing.  Per ED note 02/01/2020- sx onset 01/31/2020, last day eligible for infusion 02/09/20.  Unable to leave voicemail on any lines.  William Hamburger, NP-C

## 2020-02-08 ENCOUNTER — Telehealth: Payer: Self-pay | Admitting: Adult Health

## 2020-02-08 NOTE — Telephone Encounter (Signed)
Called toDiscuss with patient about SARS-CoV-2 positive test, COVIDsymptoms and the use of bamlanivimab, a monoclonal antibody infusion for those with mild to moderate Covid symptoms and at a high risk of hospitalization.   His finance tested + for SARS-CoV-2 beginning Feb 2021. She required hospitalization and is now receiving home health care during her d/c recovery period.  After lengthy discussion it was determined that his sx onset was 01/25/20 with the following sx's: Sore throat Body aches HA Chills/fever Nasal drainage Loss of sense of smell/taste. This places him outside th 10 day window for infusion therapy.  ED Visit 02/01/20: + SARS-CoV- 2 test  Discussed Red Flag sx's and if any develop to seek immediate medical assistance- he verbalized understanding/agreement.  He is currently living at [facility]- contact number at this facility:  [phone]  His cell number will be reactivated 02/18/2020.  All questions/concerns answered.  Roy Hamburger, NP-C

## 2020-02-10 ENCOUNTER — Ambulatory Visit: Admit: 2020-02-10 | Discharge: 2020-02-11 | Payer: MEDICAID

## 2020-02-10 DIAGNOSIS — U071 Disease due to severe acute respiratory syndrome coronavirus 2 (SARS-CoV-2) in immunocompromised patient (CMS-HCC): Secondary | ICD-10-CM

## 2020-02-10 DIAGNOSIS — D849 Immunodeficiency, unspecified: Secondary | ICD-10-CM

## 2020-02-10 DIAGNOSIS — B20 Human immunodeficiency virus [HIV] disease: Principal | ICD-10-CM

## 2020-02-18 DIAGNOSIS — B20 Human immunodeficiency virus [HIV] disease: Principal | ICD-10-CM

## 2020-02-18 MED ORDER — BUPROPION HCL SR 150 MG TABLET,12 HR SUSTAINED-RELEASE
ORAL_TABLET | Freq: Two times a day (BID) | ORAL | 0 refills | 30.00000 days | Status: CP
Start: 2020-02-18 — End: 2021-02-17

## 2020-02-18 MED ORDER — BIKTARVY 50 MG-200 MG-25 MG TABLET
ORAL_TABLET | 2 refills | 0 days | Status: CP
Start: 2020-02-18 — End: ?

## 2020-03-07 ENCOUNTER — Institutional Professional Consult (permissible substitution): Admit: 2020-03-07 | Discharge: 2020-03-08 | Payer: MEDICAID

## 2020-03-08 DIAGNOSIS — R0981 Nasal congestion: Principal | ICD-10-CM

## 2020-03-08 DIAGNOSIS — R062 Wheezing: Principal | ICD-10-CM

## 2020-03-09 ENCOUNTER — Other Ambulatory Visit: Payer: Self-pay | Admitting: Internal Medicine

## 2020-03-09 DIAGNOSIS — R0602 Shortness of breath: Secondary | ICD-10-CM

## 2020-03-14 ENCOUNTER — Other Ambulatory Visit: Payer: Self-pay | Admitting: Internal Medicine

## 2020-03-14 DIAGNOSIS — I208 Other forms of angina pectoris: Secondary | ICD-10-CM

## 2020-03-14 DIAGNOSIS — R0602 Shortness of breath: Secondary | ICD-10-CM

## 2020-03-18 ENCOUNTER — Ambulatory Visit: Payer: Medicaid Other

## 2020-03-28 DIAGNOSIS — R0981 Nasal congestion: Principal | ICD-10-CM

## 2020-03-28 DIAGNOSIS — J301 Allergic rhinitis due to pollen: Principal | ICD-10-CM

## 2020-04-02 ENCOUNTER — Emergency Department
Admission: EM | Admit: 2020-04-02 | Discharge: 2020-04-02 | Disposition: A | Discharge status: Home / Self Care | Payer: Medicaid Other | Attending: Emergency Medicine | Admitting: Emergency Medicine

## 2020-04-02 ENCOUNTER — Emergency Department: Payer: Medicaid Other

## 2020-04-02 ENCOUNTER — Other Ambulatory Visit: Payer: Self-pay

## 2020-04-02 DIAGNOSIS — Z96641 Presence of right artificial hip joint: Secondary | ICD-10-CM | POA: Diagnosis not present

## 2020-04-02 DIAGNOSIS — I10 Essential (primary) hypertension: Secondary | ICD-10-CM | POA: Diagnosis not present

## 2020-04-02 DIAGNOSIS — J45909 Unspecified asthma, uncomplicated: Secondary | ICD-10-CM | POA: Insufficient documentation

## 2020-04-02 DIAGNOSIS — B20 Human immunodeficiency virus [HIV] disease: Secondary | ICD-10-CM | POA: Diagnosis not present

## 2020-04-02 DIAGNOSIS — Z79899 Other long term (current) drug therapy: Secondary | ICD-10-CM | POA: Insufficient documentation

## 2020-04-02 DIAGNOSIS — F17210 Nicotine dependence, cigarettes, uncomplicated: Secondary | ICD-10-CM | POA: Diagnosis not present

## 2020-04-02 DIAGNOSIS — M25551 Pain in right hip: Secondary | ICD-10-CM

## 2020-04-02 MED ORDER — OXYCODONE-ACETAMINOPHEN 5-325 MG PO TABS: 1.0000 | ORAL_TABLET | ORAL | 0 refills | Status: DC | PRN

## 2020-04-02 MED ORDER — OXYCODONE-ACETAMINOPHEN 5-325 MG PO TABS
2.0000 | ORAL_TABLET | Freq: Once | ORAL | Status: AC
  Administered 2020-04-02: 2 via ORAL
  Filled 2020-04-02: qty 2

## 2020-04-02 NOTE — ED Provider Notes (Signed)
[facility] Emergency Department Provider Note  Time seen: 11:15 AM  I have reviewed the triage vital signs and the nursing notes.   HISTORY  Chief Complaint Hip Pain   HPI Roy Lashomb Garofano Sr. is a 54 y.o. male with a past medical history of HIV, bipolar, depression, presents to the emergency department for right hip pain.  According to the patient he had a hip surgery 7 months ago with a hip replacement, states he was mulching his yard yesterday when he felt a pop in the hip and has been having increased pain since then.  Patient has been able to walk on the hip but with significant discomfort per patient.  Patient has not followed up with his orthopedist.  Denies any fall.   Past Medical History:  Diagnosis Date  . AIDS (acquired immune deficiency syndrome) ([place])   . Arthritis   . Asthma   . Bipolar disorder ([place])   . Bronchitis   . Complication of anesthesia   . Depression   . Dysrhythmia    1st degree heart block/ brady  . GERD (gastroesophageal reflux disease)   . Hepatitis C    treated  . HIV (human immunodeficiency virus infection) ([place])   . HTN (hypertension)   . PONV (postoperative nausea and vomiting)     Patient Active Problem List   Diagnosis Date Noted  . 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 Angioedema 07/24/2019  . MDD (major depressive disorder), recurrent episode ([place]) 03/23/2019  . MDD (major depressive disorder), recurrent episode, moderate ([place]) 02/19/2019  . MDD (major depressive disorder), recurrent severe, without psychosis ([place]) 02/19/2019  . Chest pain 05/27/2018  . Major depressive disorder, recurrent severe without psychotic features ([place]) 05/27/2018  . ARF (acute renal failure) ([place]) 04/08/2018  . Malingering 03/07/2017  . Cocaine dependence ([place]) 02/19/2017  . Substance induced mood disorder ([place]) 02/19/2017  . HIV disease ([place]) 10/26/2016  . HTN (hypertension) 10/26/2016  . Dyslipidemia  10/26/2016  . BPH (benign prostatic hyperplasia) 10/26/2016  . Constipation 10/26/2016    Past Surgical History:  Procedure Laterality Date  . APPLICATION OF WOUND VAC Right 09/01/2019   Procedure: APPLICATION OF WOUND VAC;  Surgeon: Hessie Knows, MD;  Location: [facility] ORS;  Service: Orthopedics;  Laterality: Right;  Serial # [id number]  . HERNIA REPAIR Left    inguinal  . TOE SURGERY    . TOE SURGERY Right   . TOTAL HIP ARTHROPLASTY Right 09/01/2019   Procedure: TOTAL HIP ARTHROPLASTY ANTERIOR APPROACH;  Surgeon: Hessie Knows, MD;  Location: [facility] ORS;  Service: Orthopedics;  Laterality: Right;    Prior to Admission medications   Medication Sig Start Date End Date Taking? Authorizing Provider  albuterol (PROVENTIL HFA;VENTOLIN HFA) 108 (90 Base) MCG/ACT inhaler Inhale 1-2 puffs into the lungs every 4 (four) hours as needed for wheezing or shortness of breath. 03/26/19   Connye Burkitt, NP  amLODipine (NORVASC) 10 MG tablet Take 1 tablet (10 mg total) by mouth daily. 07/25/19   Henreitta Leber, MD  atorvastatin (LIPITOR) 10 MG tablet Take 1 tablet (10 mg total) by mouth daily at 6 PM. For high cholesterol 03/26/19   Connye Burkitt, NP  bictegravir-emtricitabine-tenofovir AF (BIKTARVY) 50-200-25 MG TABS tablet Take 1 tablet by mouth daily. For HIV 03/27/19   Connye Burkitt, NP  citalopram (CELEXA) 40 MG tablet Take 1 tablet (40 mg total) by mouth daily. For mood 03/27/19   Connye Burkitt, NP  enoxaparin (  LOVENOX) 40 MG/0.4ML injection Inject 0.4 mLs (40 mg total) into the skin daily for 14 days. 09/03/19 09/17/19  Evon Slack, PA-C  gabapentin (NEURONTIN) 300 MG capsule Take 1 capsule (300 mg total) by mouth 2 (two) times daily. 03/26/19   Aldean Baker, NP  HYDROcodone-acetaminophen (NORCO) 5-325 MG tablet Take 1 tablet by mouth every 4 (four) hours as needed for moderate pain. 02/01/20   Willy Eddy, MD  lactase (LACTAID) 3000 units tablet Take 1 tablet (3,000 Units total) by mouth 3 (three)  times daily with meals. 03/26/19   Aldean Baker, NP  oxyCODONE (OXY IR/ROXICODONE) 5 MG immediate release tablet Take 1-2 tablets (5-10 mg total) by mouth every 4 (four) hours as needed for moderate pain (pain score 4-6). 09/02/19   Evon Slack, PA-C  traZODone (DESYREL) 150 MG tablet Take 1 tablet (150 mg total) by mouth at bedtime as needed for sleep. Patient taking differently: Take 150 mg by mouth at bedtime as needed for sleep. Takes 300 mg at bedtime 03/26/19   Aldean Baker, NP    Allergies  Allergen Reactions  . Amlodipine Swelling    Of the tongue  . Lisinopril Swelling  . Lactose Other (See Comments)    GI distress  . Pollen Extract Other (See Comments)    Itchy eyes and runny nose    Family History  Problem Relation Age of Onset  . Cancer Brother   . Uterine cancer Mother   . CAD Mother   . Hypertension Mother   . Hyperlipidemia Mother     Social History Social History   Tobacco Use  . Smoking status: Current Every Day Smoker    Packs/day: 0.25    Types: Cigarettes  . Smokeless tobacco: Never Used  Substance Use Topics  . Alcohol use: Yes    Alcohol/week: 84.0 standard drinks    Types: 84 Cans of beer per week    Comment: daily  . Drug use: Yes    Types: Cocaine    Comment: in the last 6 months    Review of Systems Constitutional: Negative for fever. Cardiovascular: Negative for chest pain. Respiratory: Negative for shortness of breath. Gastrointestinal: Negative for abdominal pain Musculoskeletal: Right hip pain Neurological: Negative for headache All other ROS negative  ____________________________________________   PHYSICAL EXAM:  VITAL SIGNS: ED Triage Vitals  Enc Vitals Group     BP 04/02/20 1034 129/81     Pulse Rate 04/02/20 1034 (!) 101     Resp 04/02/20 1034 18     Temp 04/02/20 1034 99.1 F (37.3 C)     Temp Source 04/02/20 1034 Oral     SpO2 04/02/20 1034 96 %     Weight 04/02/20 1035 210 lb (95.3 kg)     Height 04/02/20  1035 5\' 9"  (1.753 m)     Head Circumference --      Peak Flow --      Pain Score 04/02/20 1035 10     Pain Loc --      Pain Edu? --      Excl. in GC? --    Constitutional: Alert and oriented. Well appearing and in no distress. Eyes: Normal exam ENT      Head: Normocephalic and atraumatic.      Mouth/Throat: Mucous membranes are moist. Cardiovascular: Normal rate, regular rhythm.  Respiratory: Normal respiratory effort without tachypnea nor retractions. Breath sounds are clear  Gastrointestinal: Soft and nontender. No distention.  Musculoskeletal: Mild tenderness  of the right hip.  Moderate pain with range of motion of the right hip.  Neurovascular intact distally. Neurologic:  Normal speech and language. No gross focal neurologic deficits  Skin:  Skin is warm, dry and intact.  Psychiatric: Mood and affect are normal.  ____________________________________________  RADIOLOGY  X-ray negative.  ____________________________________________   INITIAL IMPRESSION / ASSESSMENT AND PLAN / ED COURSE  Pertinent labs & imaging results that were available during my care of the patient were reviewed by me and considered in my medical decision making (see chart for details).   Patient presents emergency department for right hip pain after mulching his yard yesterday.  Patient states he felt a pop.  [name] hip is mildly tender to palpation with moderate pain elicited with range of motion.  Patient has been able to walk on the hip although with pain per patient.  We will obtain x-rays to evaluate.  Differential this time would include dislocation, fracture, musculoskeletal pain.  [name] x-ray is negative no sign of fracture or dislocation.  We will discharge with a short course of pain medication and crutches with weightbearing as tolerated and orthopedic follow-up with Dr. Rosita Kea.  Patient agreeable to plan of care.  Roy Hallquist Satterfield Sr. was evaluated in Emergency Department on 04/02/2020  for the symptoms described in the history of present illness. He was evaluated in the context of the global COVID-19 pandemic, which necessitated consideration that the patient might be at risk for infection with the SARS-CoV-2 virus that causes COVID-19. Institutional protocols and algorithms that pertain to the evaluation of patients at risk for COVID-19 are in a state of rapid change based on information released by regulatory bodies including the CDC and federal and state organizations. These policies and algorithms were followed during the patient's care in the ED.  ____________________________________________   FINAL CLINICAL IMPRESSION(S) / ED DIAGNOSES  Right hip pain   Minna Antis, MD 04/02/20 1137

## 2020-04-02 NOTE — Discharge Instructions (Signed)
Please call the number provided for Dr. Rosita Kea to arrange a follow-up appointment soon as possible.  In the meantime please use your crutches with weightbearing as tolerated as we discussed.  Please take your pain medication as needed but only as prescribed.  Please note that Percocet contains acetaminophen, please limit your total acetaminophen (Tylenol) to 4 g or less per day.

## 2020-04-02 NOTE — ED Triage Notes (Signed)
Pt arrives [facility] from home for possible R hip dislocation. Has artifical R hip, surgery 7-8 months ago. Pain began last night after mulching his yard. 75HR, 99% RA, 142/77.

## 2020-04-02 NOTE — ED Notes (Signed)
Pt verbalized understanding of discharge instructions. NAD at this time. 

## 2020-05-04 DIAGNOSIS — B20 Human immunodeficiency virus [HIV] disease: Principal | ICD-10-CM

## 2020-05-04 MED ORDER — BIKTARVY 50 MG-200 MG-25 MG TABLET
ORAL_TABLET | 4 refills | 0.00000 days
Start: 2020-05-04 — End: ?

## 2020-05-06 ENCOUNTER — Ambulatory Visit: Payer: Medicaid Other | Attending: Internal Medicine

## 2020-05-09 ENCOUNTER — Other Ambulatory Visit
Admission: RE | Admit: 2020-05-09 | Discharge: 2020-05-09 | Disposition: A | Discharge status: Home / Self Care | Payer: Medicaid Other | Source: Ambulatory Visit | Attending: Sports Medicine | Admitting: Sports Medicine

## 2020-05-09 DIAGNOSIS — M25459 Effusion, unspecified hip: Secondary | ICD-10-CM | POA: Diagnosis not present

## 2020-05-09 DIAGNOSIS — M009 Pyogenic arthritis, unspecified: Secondary | ICD-10-CM | POA: Insufficient documentation

## 2020-05-09 LAB — SYNOVIAL CELL COUNT + DIFF, W/ CRYSTALS
Crystals, Fluid: NONE SEEN
Eosinophils-Synovial: 0 %
Lymphocytes-Synovial Fld: 1 %
Monocyte-Macrophage-Synovial Fluid: 3 %
Neutrophil, Synovial: 96 %
Other Cells-SYN: 0
WBC, Synovial: 80745 /mm3 — ABNORMAL HIGH (ref 0–200)

## 2020-05-10 ENCOUNTER — Ambulatory Visit: Payer: Medicaid Other | Attending: Internal Medicine

## 2020-05-12 ENCOUNTER — Other Ambulatory Visit: Payer: Self-pay | Admitting: Orthopedic Surgery

## 2020-05-15 ENCOUNTER — Emergency Department
Admission: EM | Admit: 2020-05-15 | Discharge: 2020-05-17 | Disposition: A | Discharge status: Other Acute Inpatient Hospital | Payer: Medicaid Other | Attending: Emergency Medicine | Admitting: Emergency Medicine

## 2020-05-15 ENCOUNTER — Other Ambulatory Visit: Payer: Self-pay

## 2020-05-15 DIAGNOSIS — F17210 Nicotine dependence, cigarettes, uncomplicated: Secondary | ICD-10-CM | POA: Diagnosis not present

## 2020-05-15 DIAGNOSIS — F333 Major depressive disorder, recurrent, severe with psychotic symptoms: Secondary | ICD-10-CM | POA: Insufficient documentation

## 2020-05-15 DIAGNOSIS — Z96641 Presence of right artificial hip joint: Secondary | ICD-10-CM | POA: Insufficient documentation

## 2020-05-15 DIAGNOSIS — N189 Chronic kidney disease, unspecified: Secondary | ICD-10-CM | POA: Diagnosis not present

## 2020-05-15 DIAGNOSIS — Z79899 Other long term (current) drug therapy: Secondary | ICD-10-CM | POA: Insufficient documentation

## 2020-05-15 DIAGNOSIS — Z20822 Contact with and (suspected) exposure to covid-19: Secondary | ICD-10-CM | POA: Insufficient documentation

## 2020-05-15 DIAGNOSIS — F1420 Cocaine dependence, uncomplicated: Secondary | ICD-10-CM | POA: Diagnosis present

## 2020-05-15 DIAGNOSIS — J45909 Unspecified asthma, uncomplicated: Secondary | ICD-10-CM | POA: Insufficient documentation

## 2020-05-15 DIAGNOSIS — Z590 Homelessness: Secondary | ICD-10-CM | POA: Diagnosis not present

## 2020-05-15 DIAGNOSIS — Z9114 Patient's other noncompliance with medication regimen: Secondary | ICD-10-CM | POA: Insufficient documentation

## 2020-05-15 DIAGNOSIS — I129 Hypertensive chronic kidney disease with stage 1 through stage 4 chronic kidney disease, or unspecified chronic kidney disease: Secondary | ICD-10-CM | POA: Insufficient documentation

## 2020-05-15 DIAGNOSIS — R45851 Suicidal ideations: Secondary | ICD-10-CM

## 2020-05-15 DIAGNOSIS — Z046 Encounter for general psychiatric examination, requested by authority: Secondary | ICD-10-CM | POA: Diagnosis present

## 2020-05-15 DIAGNOSIS — F332 Major depressive disorder, recurrent severe without psychotic features: Secondary | ICD-10-CM | POA: Diagnosis present

## 2020-05-15 LAB — COMPREHENSIVE METABOLIC PANEL
ALT: 14 U/L (ref 0–44)
AST: 16 U/L (ref 15–41)
Albumin: 3.2 g/dL — ABNORMAL LOW (ref 3.5–5.0)
Alkaline Phosphatase: 76 U/L (ref 38–126)
Anion gap: 7 (ref 5–15)
BUN: 20 mg/dL (ref 6–20)
CO2: 25 mmol/L (ref 22–32)
Calcium: 8.4 mg/dL — ABNORMAL LOW (ref 8.9–10.3)
Chloride: 106 mmol/L (ref 98–111)
Creatinine, Ser: 1.22 mg/dL (ref 0.61–1.24)
GFR calc Af Amer: >60 mL/min (ref >60)
GFR calc non Af Amer: >60 mL/min (ref >60)
Glucose, Bld: 97 mg/dL (ref 70–99)
Potassium: 3.6 mmol/L (ref 3.5–5.1)
Sodium: 138 mmol/L (ref 135–145)
Total Bilirubin: 0.3 mg/dL (ref 0.3–1.2)
Total Protein: 7 g/dL (ref 6.5–8.1)

## 2020-05-15 LAB — CBC
HCT: 39.3 % (ref 39.0–52.0)
Hemoglobin: 13.4 g/dL (ref 13.0–17.0)
MCH: 33.2 pg (ref 26.0–34.0)
MCHC: 34.1 g/dL (ref 30.0–36.0)
MCV: 97.3 fL (ref 80.0–100.0)
Platelets: 260 10*3/uL (ref 150–400)
RBC: 4.04 MIL/uL — ABNORMAL LOW (ref 4.22–5.81)
RDW: 12.1 % (ref 11.5–15.5)
WBC: 4.5 10*3/uL (ref 4.0–10.5)
nRBC: 0 % (ref 0.0–0.2)

## 2020-05-15 LAB — URINE DRUG SCREEN, QUALITATIVE (ARMC ONLY)
Amphetamines, Ur Screen: NOT DETECTED
Barbiturates, Ur Screen: NOT DETECTED
Benzodiazepine, Ur Scrn: NOT DETECTED
Cannabinoid 50 Ng, Ur ~~LOC~~: NOT DETECTED
Cocaine Metabolite,Ur ~~LOC~~: POSITIVE — AB
MDMA (Ecstasy)Ur Screen: NOT DETECTED
Methadone Scn, Ur: NOT DETECTED
Opiate, Ur Screen: NOT DETECTED
Phencyclidine (PCP) Ur S: NOT DETECTED
Tricyclic, Ur Screen: NOT DETECTED

## 2020-05-15 LAB — SALICYLATE LEVEL: Salicylate Lvl: <7 mg/dL — ABNORMAL LOW (ref 7.0–30.0)

## 2020-05-15 LAB — ETHANOL: Alcohol, Ethyl (B): <10 mg/dL (ref <10)

## 2020-05-15 LAB — ACETAMINOPHEN LEVEL: Acetaminophen (Tylenol), Serum: <10 ug/mL — ABNORMAL LOW (ref 10–30)

## 2020-05-15 NOTE — ED Notes (Signed)
Pt states he has had nothing but bad luck.  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  However, he states he will be denied for surgery b/c he is homeless and has no where to recover. Pt has also admits to drug use and stopping his depression meds b/c he doesn't notice a difference. Pt is calm and cooperative.

## 2020-05-15 NOTE — ED Notes (Signed)
Pt given sandwich tray and ginger ale. 
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
## 2020-05-15 NOTE — ED Triage Notes (Signed)
Pt arrives to ER with BPD under IVC for SI. Pt states he has been SI with plan (OD on his meds) x 1 week. Pt calm and cooperative in triage. Coming from [facility]. A&O, ambulatory.
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
## 2020-05-15 NOTE — ED Provider Notes (Signed)
[facility] Emergency Department Provider Note   ____________________________________________    I have reviewed the triage vital signs and the nursing notes.   HISTORY  Chief Complaint Suicidal     HPI Roy Strey Sr. is a 54 y.o. male HIV, bipolar disorder, major depressive disorder, substance abuse who presents today with suicidal ideation with a plan to overdose on his meds.  He does admit to using cocaine and alcohol.  He was sent from [facility]  Past Medical History:  Diagnosis Date  . AIDS (acquired immune deficiency syndrome) ([place])   . Arthritis   . Asthma   . Bipolar disorder ([place])   . Bronchitis   . Complication of anesthesia   . Depression   . Dysrhythmia    1st degree heart block/ brady  . GERD (gastroesophageal reflux disease)   . Hepatitis C    treated  . HIV (human immunodeficiency virus infection) ([place])   . HTN (hypertension)   . PONV (postoperative nausea and vomiting)     Patient Active Problem List   Diagnosis Date Noted  . 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 Angioedema 07/24/2019  . MDD (major depressive disorder), recurrent episode ([place]) 03/23/2019  . MDD (major depressive disorder), recurrent episode, moderate ([place]) 02/19/2019  . MDD (major depressive disorder), recurrent severe, without psychosis ([place]) 02/19/2019  . Chest pain 05/27/2018  . Major depressive disorder, recurrent severe without psychotic features ([place]) 05/27/2018  . ARF (acute renal failure) ([place]) 04/08/2018  . Malingering 03/07/2017  . Cocaine dependence ([place]) 02/19/2017  . Substance induced mood disorder ([place]) 02/19/2017  . HIV disease ([place]) 10/26/2016  . HTN (hypertension) 10/26/2016  . Dyslipidemia 10/26/2016  . BPH (benign prostatic hyperplasia) 10/26/2016  . Constipation 10/26/2016    Past Surgical History:  Procedure Laterality Date  . APPLICATION OF WOUND VAC Right 09/01/2019   Procedure: APPLICATION OF WOUND VAC;   Surgeon: Hessie Knows, MD;  Location: [facility] ORS;  Service: Orthopedics;  Laterality: Right;  Serial # [id number]  . HERNIA REPAIR Left    inguinal  . TOE SURGERY    . TOE SURGERY Right   . TOTAL HIP ARTHROPLASTY Right 09/01/2019   Procedure: TOTAL HIP ARTHROPLASTY ANTERIOR APPROACH;  Surgeon: Hessie Knows, MD;  Location: [facility] ORS;  Service: Orthopedics;  Laterality: Right;    Prior to Admission medications   Medication Sig Start Date End Date Taking? Authorizing Provider  albuterol (PROVENTIL HFA;VENTOLIN HFA) 108 (90 Base) MCG/ACT inhaler Inhale 1-2 puffs into the lungs every 4 (four) hours as needed for wheezing or shortness of breath. 03/26/19   Connye Burkitt, NP  amLODipine (NORVASC) 10 MG tablet Take 1 tablet (10 mg total) by mouth daily. 07/25/19   Henreitta Leber, MD  atorvastatin (LIPITOR) 10 MG tablet Take 1 tablet (10 mg total) by mouth daily at 6 PM. For high cholesterol 03/26/19   Connye Burkitt, NP  bictegravir-emtricitabine-tenofovir AF (BIKTARVY) 50-200-25 MG TABS tablet Take 1 tablet by mouth daily. For HIV 03/27/19   Connye Burkitt, NP  citalopram (CELEXA) 40 MG tablet Take 1 tablet (40 mg total) by mouth daily. For mood 03/27/19   Connye Burkitt, NP  enoxaparin (LOVENOX) 40 MG/0.4ML injection Inject 0.4 mLs (40 mg total) into the skin daily for 14 days. Patient not taking: Reported on 05/13/2020 09/03/19 09/17/19  Duanne Guess, PA-C  gabapentin (NEURONTIN) 300 MG capsule Take 1 capsule (300 mg total) by mouth 2 (two) times daily.  03/26/19   Aldean Baker, NP  HYDROcodone-acetaminophen (NORCO) 5-325 MG tablet Take 1 tablet by mouth every 4 (four) hours as needed for moderate pain. Patient not taking: Reported on 05/13/2020 02/01/20   Willy Eddy, MD  lactase (LACTAID) 3000 units tablet Take 1 tablet (3,000 Units total) by mouth 3 (three) times daily with meals. 03/26/19   Aldean Baker, NP  oxyCODONE (OXY IR/ROXICODONE) 5 MG immediate release tablet Take 1-2 tablets (5-10 mg  total) by mouth every 4 (four) hours as needed for moderate pain (pain score 4-6). Patient not taking: Reported on 05/13/2020 09/02/19   Evon Slack, PA-C  oxyCODONE-acetaminophen (PERCOCET) 5-325 MG tablet Take 1 tablet by mouth every 4 (four) hours as needed for severe pain. Patient not taking: Reported on 05/13/2020 04/02/20   Minna Antis, MD  oxymetazoline (AFRIN) 0.05 % nasal spray Place 1 spray into both nostrils 2 (two) times daily as needed for congestion.    [provider]  traZODone (DESYREL) 150 MG tablet Take 1 tablet (150 mg total) by mouth at bedtime as needed for sleep. Patient taking differently: Take 300 mg by mouth at bedtime.  03/26/19   Aldean Baker, NP     Allergies Amlodipine, Lisinopril, Lactose, and Pollen extract  Family History  Problem Relation Age of Onset  . Cancer Brother   . Uterine cancer Mother   . CAD Mother   . Hypertension Mother   . Hyperlipidemia Mother     Social History Social History   Tobacco Use  . Smoking status: Current Every Day Smoker    Packs/day: 0.25    Types: Cigarettes  . Smokeless tobacco: Never Used  Substance Use Topics  . Alcohol use: Yes    Alcohol/week: 84.0 standard drinks    Types: 84 Cans of beer per week    Comment: daily  . Drug use: Yes    Types: Cocaine    Comment: in the last 6 months    Review of Systems  Constitutional: No fever/chills  .Cardiovascular: Denies chest pain. Respiratory: No cough     Skin: Negative for rash. Neurological: Negative for headaches    ____________________________________________   PHYSICAL EXAM:  VITAL SIGNS: ED Triage Vitals [05/15/20 1638]  Enc Vitals Group     BP (!) 139/92     Pulse Rate (!) 52     Resp 16     Temp 98.3 F (36.8 C)     Temp Source Oral     SpO2 100 %     Weight 88.5 kg (195 lb)     Height 1.753 m (5\' 9" )     Head Circumference      Peak Flow      Pain Score 10     Pain Loc      Pain Edu?      Excl. in GC?      Constitutional: Alert and oriented.   Nose: No congestion/rhinnorhea. Mouth/Throat: Mucous membranes are moist.    Cardiovascular: Normal rate, regular rhythm.  Good peripheral circulation. Respiratory: Normal respiratory effort.  No retractions.  Gastrointestinal: Soft and nontender. No distention.    Musculoskeletal:   Warm and well perfused Neurologic:  Normal speech and language. No gross focal neurologic deficits are appreciated.  Skin:  Skin is warm, dry and intact. No rash noted. Psychiatric: Depressed mood and affect l. Speech and behavior are normal.  ____________________________________________   LABS (all labs ordered are listed, but only abnormal results are displayed)  Labs  Reviewed  COMPREHENSIVE METABOLIC PANEL - Abnormal; Notable for the following components:      Result Value   Calcium 8.4 (*)    Albumin 3.2 (*)    All other components within normal limits  SALICYLATE LEVEL - Abnormal; Notable for the following components:   Salicylate Lvl <7.0 (*)    All other components within normal limits  ACETAMINOPHEN LEVEL - Abnormal; Notable for the following components:   Acetaminophen (Tylenol), Serum <10 (*)    All other components within normal limits  CBC - Abnormal; Notable for the following components:   RBC 4.04 (*)    All other components within normal limits  URINE DRUG SCREEN, QUALITATIVE ([facility] ONLY) - Abnormal; Notable for the following components:   Cocaine Metabolite,Ur [place] POSITIVE (*)    All other components within normal limits  SARS CORONAVIRUS 2 BY RT PCR (HOSPITAL ORDER, PERFORMED IN [facility])  ETHANOL   ____________________________________________  EKG   ____________________________________________  RADIOLOGY   ____________________________________________   PROCEDURES  Procedure(s) performed: No  Procedures   Critical Care performed: No ____________________________________________   INITIAL IMPRESSION /  ASSESSMENT AND PLAN / ED COURSE  Pertinent labs & imaging results that were available during my care of the patient were reviewed by me and considered in my medical decision making (see chart for details).  Patient presents with suicidal ideation, history of bipolar disorder and major depressive disorder.  Have consulted TTS and psychiatry.  Lab work is reassuring, patient is medically cleared at this time for psychiatric evaluation.  Psychiatry will admit    ____________________________________________   FINAL CLINICAL IMPRESSION(S) / ED DIAGNOSES  Final diagnoses:  Suicidal ideation        Note:  This document was prepared using Dragon voice recognition software and may include unintentional dictation errors.   Jene Every, MD 05/15/20 2256

## 2020-05-15 NOTE — ED Notes (Signed)
Pt dressed out by this RN and Mimi EDT, with male [employer] officer.   Pt belongings: gray tennis shoes, black socks, plaid shirt, dark jeans, bag of medicine in grey puma bag, black jacket, brown belt.   Belongings placed in 2 bags.

## 2020-05-16 LAB — SARS CORONAVIRUS 2 BY RT PCR (HOSPITAL ORDER, PERFORMED IN ~~LOC~~ HOSPITAL LAB): SARS Coronavirus 2: NEGATIVE

## 2020-05-16 NOTE — ED Notes (Signed)
Pt given second dinner tray and ginger ale.

## 2020-05-16 NOTE — ED Notes (Signed)
Pt up and using restroom by himself with no help needed from staff

## 2020-05-16 NOTE — ED Notes (Signed)
IVC/  PENDING  PLACEMENT 

## 2020-05-16 NOTE — BH Assessment (Signed)
Referral check:   [name] ([phone]-DP- [phone]), [name] reports no bed availability    [facility] [phone]), [name] reports under review   [name] [phone] -or- [phone]), [name] denied due to acuity (hip replacement)   [facility] [phone] reports no [facility] unit    [facility] [phone]) [name] reports under review

## 2020-05-16 NOTE — BH Assessment (Signed)
Referral information for Psychiatric Hospitalization faxed to;   . [facility] ([phone]-or- [phone]),   . [facility] ([phone]),   . [facility] ([phone] -or- [phone]),   . [facility] ([phone]),   . [facility] ([phone])  

## 2020-05-16 NOTE — ED Notes (Signed)
Patient ate 100% of breakfast and beverage, refused shower, no signs of distress at this time, patient states that He does have SI thoughts, but no plan, will continue to monitor.

## 2020-05-16 NOTE — ED Notes (Signed)
IVC/PENDING PSYCH ADMIT WHEN MEDICALLY CLEARED 

## 2020-05-16 NOTE — ED Notes (Signed)
Patient ate 100% of lunch and beverage, No signs of distress.

## 2020-05-16 NOTE — ED Notes (Signed)
Food tray was given with juice. 

## 2020-05-17 ENCOUNTER — Inpatient Hospital Stay: Admission: RE | Admit: 2020-05-17 | Payer: Medicaid Other | Source: Ambulatory Visit

## 2020-05-17 ENCOUNTER — Other Ambulatory Visit: Admission: RE | Admit: 2020-05-17 | Payer: Medicaid Other | Source: Ambulatory Visit

## 2020-05-17 NOTE — ED Notes (Signed)
Report given to [facility] in BMU. Pt prepared for transport.

## 2020-05-17 NOTE — ED Notes (Signed)
Call received from  [facility] left a contact # [phone]

## 2020-05-17 NOTE — ED Notes (Signed)
Pt given meal tray. Fork, knife and spoon removed from the tray.

## 2020-05-17 NOTE — BH Assessment (Signed)
Patient is to be admitted to [facility] by Dr. Smith Robert.  Attending Physician will be Dr. Toni Amend.   Patient has been assigned to room 312, by [facility] Charge Nurse Aurther Loft.   ER staff is aware of the admission:  Dr. Darnelle Catalan, ER MD   Tho Patient Access.

## 2020-05-17 NOTE — ED Notes (Signed)
IVC/  PENDING  PLACEMENT 

## 2020-05-17 NOTE — ED Notes (Signed)
Pt. Given a snack and a drink.

## 2020-05-17 NOTE — ED Notes (Signed)
Attempted to call report to BMU. Was asked to call back in 20 min. Nurse passing meds and is currently unavailable.

## 2020-05-17 NOTE — ED Notes (Signed)
Pt given breakfast tray and gingerale 

## 2020-05-17 NOTE — ED Notes (Addendum)
Pt given supplies for shower and mixture of clean burgundy and blue scrubs, linens changed while in shower.

## 2020-05-17 NOTE — ED Notes (Signed)
Pt given lunch tray.

## 2020-05-17 NOTE — ED Notes (Signed)
Pt c/o not having anything to eat his food with. Lattie Corns, RN is aware and pt has not been cleared from SI yet. New meal order will be put in.

## 2020-05-17 NOTE — ED Notes (Signed)
Pt to restroom. Gait steady.

## 2020-05-17 NOTE — ED Notes (Signed)
Pt given meal tray.

## 2020-05-17 NOTE — ED Provider Notes (Signed)
Emergency Medicine Observation Re-evaluation Note  Roy Duffner Sr. is a 54 y.o. male, seen on rounds today.  Pt initially presented to the ED for complaints of Suicidal Currently, the patient is resting.  Physical Exam  BP (!) 160/100 (BP Location: Left Arm)   Pulse 67   Temp 98.3 F (36.8 C) (Oral)   Resp 17   Ht 1.753 m (5\' 9" )   Wt 88.5 kg   SpO2 100%   BMI 28.80 kg/m  Physical Exam  ED Course / MDM  EKG:    I have reviewed the labs performed to date as well as medications administered while in observation.  Recent changes in the last 24 hours include nothing clinically relevant. Plan  Current plan is for psychiatric disposition. Patient is under full IVC at this time.   , MD 05/17/20 [phone]

## 2020-05-18 ENCOUNTER — Inpatient Hospital Stay: Admission: RE | Admit: 2020-05-18 | Payer: Medicaid Other | Source: Ambulatory Visit

## 2020-05-18 ENCOUNTER — Inpatient Hospital Stay
Admission: AD | Admit: 2020-05-18 | Discharge: 2020-05-24 | DRG: 882 | Disposition: A | Discharge status: Home / Self Care | Payer: Medicaid Other | Source: Intra-hospital | Attending: Psychiatry | Admitting: Psychiatry

## 2020-05-18 ENCOUNTER — Encounter: Payer: Self-pay | Admitting: Psychiatry

## 2020-05-18 ENCOUNTER — Other Ambulatory Visit: Payer: Self-pay

## 2020-05-18 DIAGNOSIS — J45909 Unspecified asthma, uncomplicated: Secondary | ICD-10-CM | POA: Diagnosis present

## 2020-05-18 DIAGNOSIS — Z96641 Presence of right artificial hip joint: Secondary | ICD-10-CM | POA: Diagnosis present

## 2020-05-18 DIAGNOSIS — Z8249 Family history of ischemic heart disease and other diseases of the circulatory system: Secondary | ICD-10-CM | POA: Diagnosis not present

## 2020-05-18 DIAGNOSIS — Z83438 Family history of other disorder of lipoprotein metabolism and other lipidemia: Secondary | ICD-10-CM | POA: Diagnosis not present

## 2020-05-18 DIAGNOSIS — F3130 Bipolar disorder, current episode depressed, mild or moderate severity, unspecified: Secondary | ICD-10-CM | POA: Diagnosis present

## 2020-05-18 DIAGNOSIS — F411 Generalized anxiety disorder: Secondary | ICD-10-CM | POA: Diagnosis present

## 2020-05-18 DIAGNOSIS — R45851 Suicidal ideations: Secondary | ICD-10-CM | POA: Diagnosis present

## 2020-05-18 DIAGNOSIS — F319 Bipolar disorder, unspecified: Secondary | ICD-10-CM | POA: Diagnosis present

## 2020-05-18 DIAGNOSIS — K219 Gastro-esophageal reflux disease without esophagitis: Secondary | ICD-10-CM | POA: Diagnosis present

## 2020-05-18 DIAGNOSIS — F1424 Cocaine dependence with cocaine-induced mood disorder: Secondary | ICD-10-CM | POA: Diagnosis present

## 2020-05-18 DIAGNOSIS — M1990 Unspecified osteoarthritis, unspecified site: Secondary | ICD-10-CM | POA: Diagnosis present

## 2020-05-18 DIAGNOSIS — F17210 Nicotine dependence, cigarettes, uncomplicated: Secondary | ICD-10-CM | POA: Diagnosis present

## 2020-05-18 DIAGNOSIS — I10 Essential (primary) hypertension: Secondary | ICD-10-CM | POA: Diagnosis present

## 2020-05-18 DIAGNOSIS — F1010 Alcohol abuse, uncomplicated: Secondary | ICD-10-CM | POA: Diagnosis present

## 2020-05-18 DIAGNOSIS — Z8049 Family history of malignant neoplasm of other genital organs: Secondary | ICD-10-CM | POA: Diagnosis not present

## 2020-05-18 DIAGNOSIS — Z590 Homelessness: Secondary | ICD-10-CM | POA: Diagnosis not present

## 2020-05-18 DIAGNOSIS — E785 Hyperlipidemia, unspecified: Secondary | ICD-10-CM | POA: Diagnosis present

## 2020-05-18 DIAGNOSIS — E739 Lactose intolerance, unspecified: Secondary | ICD-10-CM | POA: Diagnosis present

## 2020-05-18 DIAGNOSIS — F4323 Adjustment disorder with mixed anxiety and depressed mood: Principal | ICD-10-CM | POA: Diagnosis present

## 2020-05-18 DIAGNOSIS — F1420 Cocaine dependence, uncomplicated: Secondary | ICD-10-CM | POA: Diagnosis present

## 2020-05-18 DIAGNOSIS — F1994 Other psychoactive substance use, unspecified with psychoactive substance-induced mood disorder: Secondary | ICD-10-CM | POA: Diagnosis present

## 2020-05-18 DIAGNOSIS — B20 Human immunodeficiency virus [HIV] disease: Secondary | ICD-10-CM | POA: Diagnosis present

## 2020-05-18 DIAGNOSIS — M1610 Unilateral primary osteoarthritis, unspecified hip: Secondary | ICD-10-CM

## 2020-05-18 LAB — TSH: TSH: 2.116 u[IU]/mL (ref 0.350–4.500)

## 2020-05-18 MED ORDER — ALBUTEROL SULFATE HFA 108 (90 BASE) MCG/ACT IN AERS
1.0000 | INHALATION_SPRAY | RESPIRATORY_TRACT | Status: DC | PRN
  Filled 2020-05-18: qty 6.7

## 2020-05-18 MED ORDER — HYDROXYZINE HCL 10 MG PO TABS
10.0000 mg | ORAL_TABLET | Freq: Three times a day (TID) | ORAL | Status: DC | PRN
  Administered 2020-05-20 (×2): 10 mg via ORAL
  Filled 2020-05-18 (×2): qty 1

## 2020-05-18 MED ORDER — MAGNESIUM HYDROXIDE 400 MG/5ML PO SUSP: 30.0000 mL | Freq: Every day | ORAL | Status: DC | PRN

## 2020-05-18 MED ORDER — TRAZODONE HCL 50 MG PO TABS
150.0000 mg | ORAL_TABLET | Freq: Every evening | ORAL | Status: DC | PRN
  Administered 2020-05-18 – 2020-05-20 (×3): 150 mg via ORAL
  Filled 2020-05-18 (×3): qty 1

## 2020-05-18 MED ORDER — ALUM & MAG HYDROXIDE-SIMETH 200-200-20 MG/5ML PO SUSP
30.0000 mL | ORAL | Status: DC | PRN
  Administered 2020-05-23: 30 mL via ORAL
  Filled 2020-05-18: qty 30

## 2020-05-18 MED ORDER — ALUM & MAG HYDROXIDE-SIMETH 200-200-20 MG/5ML PO SUSP: 30.0000 mL | ORAL | Status: DC | PRN

## 2020-05-18 MED ORDER — GABAPENTIN 300 MG PO CAPS
300.0000 mg | ORAL_CAPSULE | Freq: Two times a day (BID) | ORAL | Status: DC
  Administered 2020-05-18 – 2020-05-22 (×3): 300 mg via ORAL
  Filled 2020-05-18 (×8): qty 1

## 2020-05-18 MED ORDER — ACETAMINOPHEN 325 MG PO TABS
650.0000 mg | ORAL_TABLET | Freq: Four times a day (QID) | ORAL | Status: DC | PRN
Start: 2020-05-18 — End: 2020-05-18

## 2020-05-18 MED ORDER — AMLODIPINE BESYLATE 5 MG PO TABS: 10.0000 mg | ORAL_TABLET | Freq: Every day | ORAL | Status: DC

## 2020-05-18 MED ORDER — NAPROXEN 500 MG PO TABS
500.0000 mg | ORAL_TABLET | Freq: Two times a day (BID) | ORAL | Status: DC
  Administered 2020-05-18 – 2020-05-24 (×12): 500 mg via ORAL
  Filled 2020-05-18 (×12): qty 1

## 2020-05-18 MED ORDER — CITALOPRAM HYDROBROMIDE 20 MG PO TABS
40.0000 mg | ORAL_TABLET | Freq: Every day | ORAL | Status: DC
  Administered 2020-05-18 – 2020-05-24 (×7): 40 mg via ORAL
  Filled 2020-05-18 (×7): qty 2

## 2020-05-18 MED ORDER — ACETAMINOPHEN 325 MG PO TABS
650.0000 mg | ORAL_TABLET | Freq: Four times a day (QID) | ORAL | Status: DC | PRN
  Administered 2020-05-20 – 2020-05-23 (×2): 650 mg via ORAL
  Filled 2020-05-18 (×2): qty 2

## 2020-05-18 MED ORDER — ATORVASTATIN CALCIUM 20 MG PO TABS
10.0000 mg | ORAL_TABLET | Freq: Every day | ORAL | Status: DC
  Administered 2020-05-19 – 2020-05-23 (×5): 10 mg via ORAL
  Filled 2020-05-18 (×6): qty 1

## 2020-05-18 MED ORDER — BICTEGRAVIR-EMTRICITAB-TENOFOV 50-200-25 MG PO TABS
1.0000 | ORAL_TABLET | Freq: Every day | ORAL | Status: DC
  Administered 2020-05-18 – 2020-05-24 (×7): 1 via ORAL
  Filled 2020-05-18 (×7): qty 1

## 2020-05-18 NOTE — Progress Notes (Signed)
Patient pleasant during assessment denying SI/HI/AVH and pain. Patient endorses depression. Patient given education, support and encouragement to be active in his treatment plan. Pt compliant with medication administration per MD orders. Patient observed by this Clinical research associate interacting appropriately with staff and peers. Patient being monitored Q 15 minutes for safety per unit protocol. Patient remains safe on the unit.

## 2020-05-18 NOTE — Progress Notes (Signed)
Recreation Therapy Notes  Date: 05/18/2020  Time: 9:30 am   Location: Craft room   Behavioral response: N/A   Intervention Topic: Communication   Discussion/Intervention: Patient did not attend group.   Clinical Observations/Feedback:  Patient did not attend group.   Latavia Goga LRT/CTRS           Sina Sumpter 05/18/2020 11:30 AM

## 2020-05-18 NOTE — Plan of Care (Signed)
NEWLY ADMITTED TO THE UNIT. Problem: Education: Goal: Knowledge of Tool General Education information/materials will improve Outcome: Not Progressing Goal: Emotional status will improve Outcome: Not Progressing Goal: Mental status will improve Outcome: Not Progressing Goal: Verbalization of understanding the information provided will improve Outcome: Not Progressing   Problem: Activity: Goal: Interest or engagement in activities will improve Outcome: Not Progressing Goal: Sleeping patterns will improve Outcome: Not Progressing   Problem: Coping: Goal: Ability to verbalize frustrations and anger appropriately will improve Outcome: Not Progressing Goal: Ability to demonstrate self-control will improve Outcome: Not Progressing   Problem: Health Behavior/Discharge Planning: Goal: Identification of resources available to assist in meeting health care needs will improve Outcome: Not Progressing Goal: Compliance with treatment plan for underlying cause of condition will improve Outcome: Not Progressing   Problem: Physical Regulation: Goal: Ability to maintain clinical measurements within normal limits will improve Outcome: Not Progressing   Problem: Safety: Goal: Periods of time without injury will increase Outcome: Not Progressing   Problem: Education: Goal: Knowledge of disease or condition will improve Outcome: Not Progressing Goal: Understanding of discharge needs will improve Outcome: Not Progressing   Problem: Health Behavior/Discharge Planning: Goal: Ability to identify changes in lifestyle to reduce recurrence of condition will improve Outcome: Not Progressing Goal: Identification of resources available to assist in meeting health care needs will improve Outcome: Not Progressing   Problem: Physical Regulation: Goal: Complications related to the disease process, condition or treatment will be avoided or minimized Outcome: Not Progressing   Problem:  Safety: Goal: Ability to remain free from injury will improve Outcome: Not Progressing

## 2020-05-18 NOTE — [facility] Group Notes (Signed)
LCSW Group Therapy Note  05/18/2020 1:00 PM  Type of Therapy/Topic:  Group Therapy:  Emotion Regulation  Participation Level:  Did Not Attend   Description of Group:   The purpose of this group is to assist patients in learning to regulate negative emotions and experience positive emotions. Patients will be guided to discuss ways in which they have been vulnerable to their negative emotions. These vulnerabilities will be juxtaposed with experiences of positive emotions or situations, and patients will be challenged to use positive emotions to combat negative ones. Special emphasis will be placed on coping with negative emotions in conflict situations, and patients will process healthy conflict resolution skills.  Therapeutic Goals: 1. Patient will identify two positive emotions or experiences to reflect on in order to balance out negative emotions 2. Patient will label two or more emotions that they find the most difficult to experience 3. Patient will demonstrate positive conflict resolution skills through discussion and/or role plays  Summary of Patient Progress: X  Therapeutic Modalities:   Cognitive Behavioral Therapy Feelings Identification Dialectical Behavioral Therapy  Deionte Spivack, MSW, LCSW 05/18/2020 2:18 PM 
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
## 2020-05-18 NOTE — Plan of Care (Signed)
Patient calm and appropriate on the unit this evening.   Problem: Education: Goal: Emotional status will improve Outcome: Progressing Goal: Mental status will improve Outcome: Progressing

## 2020-05-18 NOTE — Progress Notes (Signed)
Pt is alert and oriented to person, place, time and situation. Pt is calm, cooperative, medication complaint, out for meals, attends groups, affect is flat, mood is depressed, denies suicidal and homicidal ideation, denies hallucinations, denies feelings of anxiety. 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 A sticky note was sent to inpatient psychiatry regarding a possible need for a consult while pt is in the hospital and for follow up planning after pt's discharged, MD replied he would address this with pt today. Pt is pleasant and polite. No distress noted, none reported, will continue to monitor pt per Q15 minute face checks and monitor for safety and progress.

## 2020-05-18 NOTE — [facility] Group Notes (Signed)
[facility] Group Notes:  (Nursing/MHT/Case Management/Adjunct)  Date:  05/18/2020  Time:  9:42 PM  Type of Therapy:  Group Therapy  Participation Level:  Active  Participation Quality:  Appropriate  Affect:  Appropriate  Cognitive:  Alert  Insight:  Good  Engagement in Group:  Engaged and he said he lost card and need to know a fax number.  Modes of Intervention:  Support  Summary of Progress/Problems:  Roy [name] 05/18/2020, 9:42 PM
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
## 2020-05-18 NOTE — BHH Suicide Risk Assessment (Signed)
[facility] Admission Suicide Risk Assessment   Nursing information obtained from:  Patient Demographic factors:  Male Current Mental Status:  Suicidal ideation indicated by patient Loss Factors:  Financial problems / change in socioeconomic status Historical Factors:  Impulsivity Risk Reduction Factors:  Positive therapeutic relationship  Total Time spent with patient: 1 hour Principal Problem: Adjustment disorder with mixed anxiety and depressed mood Diagnosis:  Principal Problem:   Adjustment disorder with mixed anxiety and depressed mood Active Problems:   HIV disease (HCC)   HTN (hypertension)   Dyslipidemia   Cocaine dependence (HCC)   Substance induced mood disorder (HCC)   Bipolar disorder (manic depression) (HCC)   Arthritis pain, hip  Subjective Data: Patient seen chart reviewed.  54 year old man with a history of substance abuse and mood instability came to the emergency room reporting suicidal thoughts.  On interview today he does not mention being suicidal.  He has clear plans for the future.  Still says his mood is not so good but seems much more hopeful than he was yesterday.  No evidence of psychosis.  No evidence of any withdrawal problems  Continued Clinical Symptoms:  Alcohol Use Disorder Identification Test Final Score (AUDIT): 14 The "Alcohol Use Disorders Identification Test", Guidelines for Use in Primary Care, Second Edition.  World Science writer [facility]). Score between 0-7:  no or low risk or alcohol related problems. Score between 8-15:  moderate risk of alcohol related problems. Score between 16-19:  high risk of alcohol related problems. Score 20 or above:  warrants further diagnostic evaluation for alcohol dependence and treatment.   CLINICAL FACTORS:   Depression:   Comorbid alcohol abuse/dependence Alcohol/Substance Abuse/Dependencies   Musculoskeletal: Strength & Muscle Tone: within normal limits Gait & Station: Slightly unsteady due to arthritis in  both hips and pain particularly on the right side Patient leans: N/A  Psychiatric Specialty Exam: Physical Exam  Nursing note and vitals reviewed. Constitutional: He appears well-developed and well-nourished.  HENT:  Head: Normocephalic and atraumatic.  Eyes: Pupils are equal, round, and reactive to light. Conjunctivae are normal.  Cardiovascular: Regular rhythm and normal heart sounds.  Respiratory: Effort normal. No respiratory distress.  GI: Soft.  Musculoskeletal:        General: Normal range of motion.     Cervical back: Normal range of motion.  Neurological: He is alert.  Skin: Skin is warm and dry.  Psychiatric: His speech is normal and behavior is normal. Judgment and thought content normal. Cognition and memory are normal. He exhibits a depressed mood.    Review of Systems  Constitutional: Negative.   HENT: Negative.   Eyes: Negative.   Respiratory: Negative.   Cardiovascular: Negative.   Gastrointestinal: Negative.   Musculoskeletal: Positive for arthralgias.  Skin: Negative.   Neurological: Negative.   Psychiatric/Behavioral: Positive for dysphoric mood.    Height 5\' 9"  (1.753 m), weight 88.5 kg.Body mass index is 28.81 kg/m.  General Appearance: Casual  Eye Contact:  Good  Speech:  Clear and Coherent  Volume:  Normal  Mood:  Dysphoric  Affect:  Congruent  Thought Process:  Coherent  Orientation:  Full (Time, Place, and Person)  Thought Content:  Logical  Suicidal Thoughts:  Yes.  without intent/plan  Homicidal Thoughts:  No  Memory:  Immediate;   Fair Recent;   Fair Remote;   Fair  Judgement:  Fair  Insight:  Fair  Psychomotor Activity:  Normal  Concentration:  Concentration: Fair  Recall:  of Knowledge:  Fair  Language:  Fair  Akathisia:  No  Handed:  Right  AIMS (if indicated):     Assets:  Desire for Improvement Resilience  ADL's:  Impaired  Cognition:  WNL  Sleep:         COGNITIVE FEATURES THAT CONTRIBUTE TO RISK:   Thought constriction (tunnel vision)    SUICIDE RISK:   Minimal: No identifiable suicidal ideation.  Patients presenting with no risk factors but with morbid ruminations; may be classified as minimal risk based on the severity of the depressive symptoms  PLAN OF CARE: Continue 15-minute checks.  Medication for depression.  Manage medical problems.  Treatment team working on stabilization  I certify that inpatient services furnished can reasonably be expected to improve the patient's condition.   Alethia Berthold, MD 05/18/2020, 5:16 PM
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
## 2020-05-18 NOTE — Tx Team (Signed)
Initial Treatment Plan 05/18/2020 4:56 AM Roy Liter Locastro Sr. PDF:[id number]    PATIENT STRESSORS: Educational concerns Financial difficulties Health problems Medication change or noncompliance Substance abuse   PATIENT STRENGTHS: Capable of independent living Communication skills   PATIENT IDENTIFIED PROBLEMS: Financial stress  Loss of housing  Substance use                 DISCHARGE CRITERIA:  Ability to meet basic life and health needs Adequate post-discharge living arrangements Improved stabilization in mood, thinking, and/or behavior Motivation to continue treatment in a less acute level of care Need for constant or close observation no longer present Verbal commitment to aftercare and medication compliance Withdrawal symptoms are absent or subacute and managed without 24-hour nursing intervention  PRELIMINARY DISCHARGE PLAN: Attend PHP/IOP Attend 12-step recovery group Outpatient therapy Placement in alternative living arrangements  PATIENT/FAMILY INVOLVEMENT: This treatment plan has been presented to and reviewed with the patient, Roy Register Sr.,.  The patient has been given the opportunity to ask questions and make suggestions.  Roy Kamaka L Butler-Nicholson, LPN 08/19/122, 7:99 AM

## 2020-05-18 NOTE — Progress Notes (Signed)
Patient is a 73 yr black male quiet and reserved during initial encounter. He is calm and cooperative as he states he is tired and wanting to lay down for the night.  He endorses depression and SI, but does not have a plan while on the unit. He verbally contracted for safety at this time. He denied HI/AVH and anxiety. No contraband on person or in clothes. He declined skin assessment and vitals asking to complete these task later in the morning.  Patient was acclimated to the unit then shown his room were he promptly laid in the bed. He remains safe at this time with 15 minute safety checks.

## 2020-05-19 NOTE — Plan of Care (Signed)
The patient is calm and cooperative.  Problem: Education: Goal: Emotional status will improve Outcome: Progressing Goal: Mental status will improve Outcome: Progressing   

## 2020-05-19 NOTE — BHH Counselor (Signed)
Per pt request, CSW provided pt with a list of boarding houses. Pt says he is scheduled to call today at 1pm and 3pm for a room.

## 2020-05-19 NOTE — Progress Notes (Signed)
Recreation Therapy Notes  INPATIENT RECREATION THERAPY ASSESSMENT  Patient Details Name: Roy [name] [facility] Sr. MRN: [id number] DOB: 1966-06-03 Today's Date: 05/19/2020       Information Obtained From: Patient  Able to Participate in Assessment/Interview: Yes  Patient Presentation: Responsive  Reason for Admission (Per Patient): Active Symptoms, Med Non-Compliance  Patient Stressors:    Coping Skills:   Art  Leisure Interests (2+):  (Nothing)  Frequency of Recreation/Participation: Monthly  Awareness of Community Resources:     [employer]:     Current Use:    If no, Barriers?:    Expressed Interest in [employer] Information:    [place] of Residence:  Film/video editor  Patient Main Form of Transportation: Therapist, music  Patient Strengths:  Nothing  Patient Identified Areas of Improvement:  Getting my card back  Patient Goal for Hospitalization:  Get a place to stay  Current SI (including self-harm):  No  Current HI:  No  Current AVH: No  Staff Intervention Plan: Group Attendance, Collaborate with Interdisciplinary Treatment Team  Consent to Intern Participation: N/A  Roy [name] 05/19/2020, 1:35 PM

## 2020-05-19 NOTE — BHH Group Notes (Signed)
Balance In Life °05/19/2020 9:30AM/1PM ° °Type of Therapy/Topic:  Group Therapy:  Balance in Life ° °Participation Level:  Did Not Attend ° °Description of Group:   °This group will address the concept of balance and how it feels and looks when one is unbalanced. Patients will be encouraged to process areas in their lives that are out of balance and identify reasons for remaining unbalanced. Facilitators will guide patients in utilizing problem-solving interventions to address and correct the stressor making their life unbalanced. Understanding and applying boundaries will be explored and addressed for obtaining and maintaining a balanced life. Patients will be encouraged to explore ways to assertively make their unbalanced needs known to significant others in their lives, using other group members and facilitator for support and feedback. ° °Therapeutic Goals: °1. Patient will identify two or more emotions or situations they have that consume much of in their lives. °2. Patient will identify signs/triggers that life has become out of balance:  °3. Patient will identify two ways to set boundaries in order to achieve balance in their lives:  °4. Patient will demonstrate ability to communicate their needs through discussion and/or role plays ° °Summary of Patient Progress: ° ° ° °Therapeutic Modalities:   °Cognitive Behavioral Therapy °Solution-Focused Therapy °Assertiveness Training ° °Roy [name] T Swanson Farnell, LCSW ° °

## 2020-05-19 NOTE — Plan of Care (Signed)
Patient stated he had a great day and was pleasant and cooperative on the unit  Problem: Education: Goal: Emotional status will improve Outcome: Progressing Goal: Mental status will improve Outcome: Progressing

## 2020-05-19 NOTE — Progress Notes (Signed)
The patient is calm and cooperative.  No significant changes to note.

## 2020-05-19 NOTE — Progress Notes (Signed)
[facility] MD Progress Note  05/19/2020 5:15 PM Roy Zigmund Daniel Blondin Sr.  MRN:  [id number] Subjective: Follow-up patient with substance abuse.  Patient reports no change to his mood.  Feeling stable.  No current suicidal ideation.  Working on discharge planning Principal Problem: Adjustment disorder with mixed anxiety and depressed mood Diagnosis: Principal Problem:   Adjustment disorder with mixed anxiety and depressed mood Active Problems:   HIV disease (HCC)   HTN (hypertension)   Dyslipidemia   Cocaine dependence (HCC)   Substance induced mood disorder (HCC)   Bipolar disorder (manic depression) ([place])   Arthritis pain, hip  Total Time spent with patient: 20 minutes  Past Psychiatric History: Past history of alcohol and cocaine abuse  Past Medical History:  Past Medical History:  Diagnosis Date  . AIDS (acquired immune deficiency syndrome) ([place])   . Arthritis   . Asthma   . Bipolar disorder ([place])   . Bronchitis   . Complication of anesthesia   . Depression   . Dysrhythmia    1st degree heart block/ brady  . GERD (gastroesophageal reflux disease)   . Hepatitis C    treated  . HIV (human immunodeficiency virus infection) ([place])   . HTN (hypertension)   . PONV (postoperative nausea and vomiting)     Past Surgical History:  Procedure Laterality Date  . APPLICATION OF WOUND VAC Right 09/01/2019   Procedure: APPLICATION OF WOUND VAC;  Surgeon: Hessie Knows, MD;  Location: [facility] ORS;  Service: Orthopedics;  Laterality: Right;  Serial # [id number]  . HERNIA REPAIR Left    inguinal  . TOE SURGERY    . TOE SURGERY Right   . TOTAL HIP ARTHROPLASTY Right 09/01/2019   Procedure: TOTAL HIP ARTHROPLASTY ANTERIOR APPROACH;  Surgeon: Hessie Knows, MD;  Location: [facility] ORS;  Service: Orthopedics;  Laterality: Right;   Family History:  Family History  Problem Relation Age of Onset  . Cancer Brother   . Uterine cancer Mother   . CAD Mother   . Hypertension Mother   . Hyperlipidemia Mother     Family Psychiatric  History: See previous Social History:  Social History   Substance and Sexual Activity  Alcohol Use Yes  . Alcohol/week: 84.0 standard drinks  . Types: 84 Cans of beer per week   Comment: daily     Social History   Substance and Sexual Activity  Drug Use Yes  . Types: Cocaine   Comment: in the last 6 months    Social History   Socioeconomic History  . Marital status: Divorced    Spouse name: Not on file  . Number of children: Not on file  . Years of education: Not on file  . Highest education level: Not on file  Occupational History  . Not on file  Tobacco Use  . Smoking status: Current Every Day Smoker    Packs/day: 0.25    Types: Cigarettes  . Smokeless tobacco: Never Used  Substance and Sexual Activity  . Alcohol use: Yes    Alcohol/week: 84.0 standard drinks    Types: 84 Cans of beer per week    Comment: daily  . Drug use: Yes    Types: Cocaine    Comment: in the last 6 months  . Sexual activity: Yes  Other Topics Concern  . Not on file  Social History Narrative  . Not on file   Social Determinants of Health   Financial Resource Strain:   . Difficulty of Paying Living Expenses:  Food Insecurity:   . Worried About Programme researcher, broadcasting/film/video in the Last Year:   . Barista in the Last Year:   Transportation Needs:   . Freight forwarder (Medical):   Marland Kitchen Lack of Transportation (Non-Medical):   Physical Activity:   . Days of Exercise per Week:   . Minutes of Exercise per Session:   Stress:   . Feeling of Stress :   Social Connections:   . Frequency of Communication with Friends and Family:   . Frequency of Social Gatherings with Friends and Family:   . Attends Religious Services:   . Active Member of Clubs or Organizations:   . Attends Banker Meetings:   Marland Kitchen Marital Status:    Additional Social History:                         Sleep: Fair  Appetite:  Fair  Current Medications: Current  Facility-Administered Medications  Medication Dose Route Frequency Provider Last Rate Last Admin  . acetaminophen (TYLENOL) tablet 650 mg  650 mg Oral Q6H PRN Roselind Messier, MD      . albuterol (VENTOLIN HFA) 108 (90 Base) MCG/ACT inhaler 1-2 puff  1-2 puff Inhalation Q4H PRN Mosie Angus, Jackquline Denmark, MD      . alum & mag hydroxide-simeth (MAALOX/MYLANTA) 200-200-20 MG/5ML suspension 30 mL  30 mL Oral Q4H PRN Roselind Messier, MD      . amLODipine (NORVASC) tablet 10 mg  10 mg Oral Daily Dian Minahan, Jackquline Denmark, MD   Stopped at 05/19/20 0957  . atorvastatin (LIPITOR) tablet 10 mg  10 mg Oral q1800 Colbin Jovel, Jackquline Denmark, MD   10 mg at 05/19/20 1703  . bictegravir-emtricitabine-tenofovir AF (BIKTARVY) 50-200-25 MG per tablet 1 tablet  1 tablet Oral Daily Asaf Elmquist, Jackquline Denmark, MD   1 tablet at 05/19/20 0954  . citalopram (CELEXA) tablet 40 mg  40 mg Oral Daily Tamsyn Owusu, Jackquline Denmark, MD   40 mg at 05/19/20 0954  . gabapentin (NEURONTIN) capsule 300 mg  300 mg Oral BID Shaquel Josephson T, MD   300 mg at 05/18/20 1731  . hydrOXYzine (ATARAX/VISTARIL) tablet 10 mg  10 mg Oral TID PRN Roselind Messier, MD      . magnesium hydroxide (MILK OF MAGNESIA) suspension 30 mL  30 mL Oral Daily PRN Roselind Messier, MD      . naproxen (NAPROSYN) tablet 500 mg  500 mg Oral BID WC Envi Eagleson, Jackquline Denmark, MD   500 mg at 05/19/20 1703  . traZODone (DESYREL) tablet 150 mg  150 mg Oral QHS PRN Bionca Mckey, Jackquline Denmark, MD   150 mg at 05/18/20 2123    Lab Results:  Results for orders placed or performed during the hospital encounter of 05/18/20 (from the past 48 hour(s))  TSH     Status: None   Collection Time: 05/18/20  6:54 AM  Result Value Ref Range   TSH 2.116 0.350 - 4.500 uIU/mL    Comment: Performed by a 3rd Generation assay with a functional sensitivity of <=0.01 uIU/mL. Performed at [facility], [address] [place], [place] [address]     Blood Alcohol level:  Lab Results  Component Value Date   [facility] <10 05/15/2020   ETH <10 03/21/2019     Metabolic Disorder Labs: Lab Results  Component Value Date   HGBA1C 5.2 05/27/2018   MPG 102.54 05/27/2018   MPG 114 10/26/2016   No results found for: PROLACTIN  Lab Results  Component Value Date   CHOL 158 01/23/2019   TRIG 63 01/23/2019   HDL 43 01/23/2019   CHOLHDL 3.7 01/23/2019   VLDL 13 01/23/2019   LDLCALC 102 (H) 01/23/2019   LDLCALC 67 05/27/2018    Physical Findings: AIMS:  , ,  ,  ,    CIWA:    COWS:     Musculoskeletal: Strength & Muscle Tone: within normal limits Gait & Station: normal Patient leans: N/A  Psychiatric Specialty Exam: Physical Exam  Nursing note and vitals reviewed. Constitutional: He appears well-developed and well-nourished.  HENT:  Head: Normocephalic and atraumatic.  Eyes: Pupils are equal, round, and reactive to light. Conjunctivae are normal.  Cardiovascular: Regular rhythm and normal heart sounds.  Respiratory: Effort normal. No respiratory distress.  GI: Soft.  Musculoskeletal:        General: Normal range of motion.     Cervical back: Normal range of motion.  Neurological: He is alert.  Skin: Skin is warm and dry.  Psychiatric: He has a normal mood and affect. His behavior is normal. Judgment and thought content normal.    Review of Systems  Constitutional: Negative.   HENT: Negative.   Eyes: Negative.   Respiratory: Negative.   Cardiovascular: Negative.   Gastrointestinal: Negative.   Musculoskeletal: Negative.   Skin: Negative.   Neurological: Negative.   Psychiatric/Behavioral: Negative.     Blood pressure 136/85, pulse 88, temperature 98.6 F (37 C), temperature source Oral, resp. rate 17, height 5\' 9"  (1.753 m), weight 88.5 kg, SpO2 97 %.Body mass index is 28.81 kg/m.  General Appearance: Casual  Eye Contact:  Good  Speech:  Clear and Coherent  Volume:  Normal  Mood:  Euthymic  Affect:  Constricted  Thought Process:  Goal Directed  Orientation:  Full (Time, Place, and Person)  Thought Content:   Logical  Suicidal Thoughts:  No  Homicidal Thoughts:  No  Memory:  Immediate;   Fair Recent;   Fair Remote;   Fair  Judgement:  Fair  Insight:  Fair  Psychomotor Activity:  Normal  Concentration:  Concentration: Fair  Recall:  of Knowledge:  Fair  Language:  Fair  Akathisia:  No  Handed:  Right  AIMS (if indicated):     Assets:  Housing Physical Health Resilience  ADL's:  Intact  Cognition:  WNL  Sleep:  Number of Hours: 7.25     Treatment Plan Summary: Plan Patient is seeking out arrangements for a place to stay and we hope we can arrange discharge within the next day or so  [employer], MD 05/19/2020, 5:15 PM

## 2020-05-19 NOTE — Progress Notes (Signed)
Recreation Therapy Notes  Date: 05/19/2020  Time: 9:30 am   Location: Room 21   Behavioral response: N/A   Intervention Topic: Animal Assisted therapy    Discussion/Intervention: Patient did not attend group.   Clinical Observations/Feedback:  Patient did not attend group.   Derin Granquist LRT/CTRS        [name] 05/19/2020 12:02 PM
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
## 2020-05-19 NOTE — Progress Notes (Signed)
Patient pleasant during assessment denying SI/HI/AVH and pain. Patient endorses depression. Patient given education, support and encouragement to be active in his treatment plan. Pt compliant with medication administration per MD orders. Patient observed by this writer interacting appropriately with staff and peers. Patient being monitored Q 15 minutes for safety per unit protocol. Patient remains safe on the unit.  
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
## 2020-05-20 NOTE — Progress Notes (Signed)
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

## 2020-05-20 NOTE — Progress Notes (Signed)
[facility] MD Progress Note  05/20/2020 1:57 PM Roy Joyce Gross Mainwaring Sr.  MRN:  [id number] Subjective: Follow-up for patient with mood symptoms and substance abuse.  His concerns tend to still be around getting his bank card.  He says he is running into some problems with it but hopes that will be mailed soon.  Mood otherwise stable.  No suicidal or homicidal thoughts no evidence of psychosis. Principal Problem: Adjustment disorder with mixed anxiety and depressed mood Diagnosis: Principal Problem:   Adjustment disorder with mixed anxiety and depressed mood Active Problems:   HIV disease (HCC)   HTN (hypertension)   Dyslipidemia   Cocaine dependence (HCC)   Substance induced mood disorder (HCC)   Bipolar disorder (manic depression) (HCC)   Arthritis pain, hip  Total Time spent with patient: 30 minutes  Past Psychiatric History: Past psychiatric history of substance abuse and behavior problems  Past Medical History:  Past Medical History:  Diagnosis Date  . AIDS (acquired immune deficiency syndrome) (HCC)   . Arthritis   . Asthma   . Bipolar disorder (HCC)   . Bronchitis   . Complication of anesthesia   . Depression   . Dysrhythmia    1st degree heart block/ brady  . GERD (gastroesophageal reflux disease)   . Hepatitis C    treated  . HIV (human immunodeficiency virus infection) (HCC)   . HTN (hypertension)   . PONV (postoperative nausea and vomiting)     Past Surgical History:  Procedure Laterality Date  . APPLICATION OF WOUND VAC Right 09/01/2019   Procedure: APPLICATION OF WOUND VAC;  Surgeon: Kennedy Bucker, MD;  Location: [facility] ORS;  Service: Orthopedics;  Laterality: Right;  Serial # [id number]  . HERNIA REPAIR Left    inguinal  . TOE SURGERY    . TOE SURGERY Right   . TOTAL HIP ARTHROPLASTY Right 09/01/2019   Procedure: TOTAL HIP ARTHROPLASTY ANTERIOR APPROACH;  Surgeon: Kennedy Bucker, MD;  Location: [facility] ORS;  Service: Orthopedics;  Laterality: Right;   Family History:   Family History  Problem Relation Age of Onset  . Cancer Brother   . Uterine cancer Mother   . CAD Mother   . Hypertension Mother   . Hyperlipidemia Mother    Family Psychiatric  History: See previous Social History:  Social History   Substance and Sexual Activity  Alcohol Use Yes  . Alcohol/week: 84.0 standard drinks  . Types: 84 Cans of beer per week   Comment: daily     Social History   Substance and Sexual Activity  Drug Use Yes  . Types: Cocaine   Comment: in the last 6 months    Social History   Socioeconomic History  . Marital status: Divorced    Spouse name: Not on file  . Number of children: Not on file  . Years of education: Not on file  . Highest education level: Not on file  Occupational History  . Not on file  Tobacco Use  . Smoking status: Current Every Day Smoker    Packs/day: 0.25    Types: Cigarettes  . Smokeless tobacco: Never Used  Substance and Sexual Activity  . Alcohol use: Yes    Alcohol/week: 84.0 standard drinks    Types: 84 Cans of beer per week    Comment: daily  . Drug use: Yes    Types: Cocaine    Comment: in the last 6 months  . Sexual activity: Yes  Other Topics Concern  . Not on file  Social History Narrative  . Not on file   Social Determinants of Health   Financial Resource Strain:   . Difficulty of Paying Living Expenses:   Food Insecurity:   . Worried About Charity fundraiser in the Last Year:   . Arboriculturist in the Last Year:   Transportation Needs:   . Film/video editor (Medical):   Marland Kitchen Lack of Transportation (Non-Medical):   Physical Activity:   . Days of Exercise per Week:   . Minutes of Exercise per Session:   Stress:   . Feeling of Stress :   Social Connections:   . Frequency of Communication with Friends and Family:   . Frequency of Social Gatherings with Friends and Family:   . Attends Religious Services:   . Active Member of Clubs or Organizations:   . Attends Archivist Meetings:    Marland Kitchen Marital Status:    Additional Social History:                         Sleep: Fair  Appetite:  Fair  Current Medications: Current Facility-Administered Medications  Medication Dose Route Frequency Provider Last Rate Last Admin  . acetaminophen (TYLENOL) tablet 650 mg  650 mg Oral Q6H PRN Eulas Post, MD   650 mg at 05/20/20 0853  . albuterol (VENTOLIN HFA) 108 (90 Base) MCG/ACT inhaler 1-2 puff  1-2 puff Inhalation Q4H PRN Connee Ikner T, MD      . alum & mag hydroxide-simeth (MAALOX/MYLANTA) 200-200-20 MG/5ML suspension 30 mL  30 mL Oral Q4H PRN Eulas Post, MD      . amLODipine (NORVASC) tablet 10 mg  10 mg Oral Daily Brecken Walth, Madie Reno, MD   Stopped at 05/19/20 0957  . atorvastatin (LIPITOR) tablet 10 mg  10 mg Oral q1800 Tait Balistreri, Madie Reno, MD   10 mg at 05/19/20 1703  . bictegravir-emtricitabine-tenofovir AF (BIKTARVY) 50-200-25 MG per tablet 1 tablet  1 tablet Oral Daily London Tarnowski, Madie Reno, MD   1 tablet at 05/20/20 [phone]  . citalopram (CELEXA) tablet 40 mg  40 mg Oral Daily Lillyth Spong, Madie Reno, MD   40 mg at 05/20/20 0852  . gabapentin (NEURONTIN) capsule 300 mg  300 mg Oral BID Zuzu Befort T, MD   300 mg at 05/18/20 1731  . hydrOXYzine (ATARAX/VISTARIL) tablet 10 mg  10 mg Oral TID PRN Eulas Post, MD   10 mg at 05/20/20 0855  . magnesium hydroxide (MILK OF MAGNESIA) suspension 30 mL  30 mL Oral Daily PRN Eulas Post, MD      . naproxen (NAPROSYN) tablet 500 mg  500 mg Oral BID WC Willis Holquin, Madie Reno, MD   500 mg at 05/20/20 0852  . traZODone (DESYREL) tablet 150 mg  150 mg Oral QHS PRN Amai Cappiello, Madie Reno, MD   150 mg at 05/19/20 2125    Lab Results: No results found for this or any previous visit (from the past 7 hour(s)).  Blood Alcohol level:  Lab Results  Component Value Date   ETH <10 05/15/2020   ETH <10 01/75/1025    Metabolic Disorder Labs: Lab Results  Component Value Date   HGBA1C 5.2 05/27/2018   MPG 102.54 05/27/2018   MPG 114 10/26/2016    No results found for: PROLACTIN Lab Results  Component Value Date   CHOL 158 01/23/2019   TRIG 63 01/23/2019   HDL 43 01/23/2019   CHOLHDL 3.7 01/23/2019   VLDL 13  01/23/2019   LDLCALC 102 (H) 01/23/2019   LDLCALC 67 05/27/2018    Physical Findings: AIMS:  , ,  ,  ,    CIWA:    COWS:     Musculoskeletal: Strength & Muscle Tone: within normal limits Gait & Station: normal Patient leans: N/A  Psychiatric Specialty Exam: Physical Exam  Nursing note and vitals reviewed. Constitutional: He appears well-developed and well-nourished.  HENT:  Head: Normocephalic and atraumatic.  Eyes: Pupils are equal, round, and reactive to light. Conjunctivae are normal.  Cardiovascular: Regular rhythm and normal heart sounds.  Respiratory: Effort normal. No respiratory distress.  GI: Soft.  Musculoskeletal:        General: Normal range of motion.     Cervical back: Normal range of motion.  Neurological: He is alert.  Skin: Skin is warm and dry.  Psychiatric: He has a normal mood and affect. His behavior is normal. Judgment and thought content normal.    Review of Systems  Constitutional: Negative.   HENT: Negative.   Eyes: Negative.   Respiratory: Negative.   Cardiovascular: Negative.   Gastrointestinal: Negative.   Musculoskeletal: Negative.   Skin: Negative.   Neurological: Negative.   Psychiatric/Behavioral: Negative.     Blood pressure (!) 132/96, pulse (!) 46, temperature 98.6 F (37 C), temperature source Oral, resp. rate 18, height 5\' 9"  (1.753 m), weight 88.5 kg, SpO2 98 %.Body mass index is 28.81 kg/m.  General Appearance: Casual  Eye Contact:  Fair  Speech:  Clear and Coherent  Volume:  Normal  Mood:  Euthymic  Affect:  Congruent  Thought Process:  Goal Directed  Orientation:  Full (Time, Place, and Person)  Thought Content:  Logical  Suicidal Thoughts:  No  Homicidal Thoughts:  No  Memory:  Immediate;   Fair Recent;   Fair Remote;   Fair  Judgement:  Fair   Insight:  Fair  Psychomotor Activity:  Normal  Concentration:  Concentration: Fair  Recall:  of Knowledge:  Fair  Language:  Fair  Akathisia:  No  Handed:  Right  AIMS (if indicated):     Assets:  Desire for Improvement  ADL's:  Intact  Cognition:  WNL  Sleep:  Number of Hours: 6     Treatment Plan Summary: Daily contact with patient to assess and evaluate symptoms and progress in treatment, Medication management and Plan Psychiatrically stable.  Just waiting for his money to get stabilized so he can be discharged.  No change to treatment plan.  Encourage group attendance  [employer], MD 05/20/2020, 1:57 PM

## 2020-05-20 NOTE — Progress Notes (Signed)
Patient refused his Gabapentin, stating that "it messes with my manhood". MD will be notified.

## 2020-05-20 NOTE — Progress Notes (Signed)
Recreation Therapy Notes  Date: 05/20/2020  Time: 9:30 am   Location: Craft room   Behavioral response: N/A   Intervention Topic: Emotions   Discussion/Intervention: Patient did not attend group.   Clinical Observations/Feedback:  Patient did not attend group.   Charlton Boule LRT/CTRS        Roy [name] 05/20/2020 12:56 PM 

## 2020-05-20 NOTE — [facility] Group Notes (Signed)
LCSW Group Therapy Note  05/20/2020 2:40 PM  Type of Therapy and Topic:  Group Therapy:  Feelings around Relapse and Recovery  Participation Level:  Did Not Attend   Description of Group:    Patients in this group will discuss emotions they experience before and after a relapse. They will process how experiencing these feelings, or avoidance of experiencing them, relates to having a relapse. Facilitator will guide patients to explore emotions they have related to recovery. Patients will be encouraged to process which emotions are more powerful. They will be guided to discuss the emotional reaction significant others in their lives may have to their relapse or recovery. Patients will be assisted in exploring ways to respond to the emotions of others without this contributing to a relapse.  Therapeutic Goals: 1. Patient will identify two or more emotions that lead to a relapse for them 2. Patient will identify two emotions that result when they relapse 3. Patient will identify two emotions related to recovery 4. Patient will demonstrate ability to communicate their needs through discussion and/or role plays   Summary of Patient Progress: X  Therapeutic Modalities:   Cognitive Behavioral Therapy Solution-Focused Therapy Assertiveness Training Relapse Prevention Therapy   Penni Homans, MSW, LCSW 05/20/2020 2:40 PM

## 2020-05-20 NOTE — [facility] Suicide Risk Assessment (Signed)
[facility] INPATIENT:  Family/Significant Other Suicide Prevention Education  Suicide Prevention Education:  Education Completed; Roy [name], [id number] has been identified by the patient as the family member/significant other with whom the patient will be residing, and identified as the person(s) who will aid the patient in the event of a mental health crisis (suicidal ideations/suicide attempt).  With written consent from the patient, the family member/significant other has been provided the following suicide prevention education, prior to the and/or following the discharge of the patient.  The suicide prevention education provided includes the following:  Suicide risk factors  Suicide prevention and interventions  National Suicide Hotline telephone number  [facility] assessment telephone number  [facility] Emergency Assistance 911  [facility] and/or Residential Mobile Crisis Unit telephone number  Request made of family/significant other to:  Remove weapons (e.g., guns, rifles, knives), all items previously/currently identified as safety concern.    Remove drugs/medications (over-the-counter, prescriptions, illicit drugs), all items previously/currently identified as a safety concern.  The family member/significant other verbalizes understanding of the suicide prevention education information provided.  The family member/significant other agrees to remove the items of safety concern listed above. Ms. Roy [name] reports she encouraged the patient to seek help after noticing for two weeks changes in mood and behavior. She states the patient has a history of suicidal ideation and depression. She denies the patient having access to guns or weapons. No additional information provided, Ms. Roy [name] stated not feeling well.  Clare Casto T Milik Gilreath 05/20/2020, 3:41 PM

## 2020-05-20 NOTE — Progress Notes (Signed)
MHT engaged in nightly group greeting and introducing self to patient. MHT went over the rules for the unit, provided snack, and went over the remaining routine for the night. MHT interacted with patient asking what a good coping skill is that is used daily and a good goal patient is working on meeting. Patient responded that his coping skill is to continue to take his time on things and allow things to process before he reacts. Patient goal was to get his money card activated and it mailed to him as soon as possible which he obtained today and is just waiting for it to arrive.

## 2020-05-21 MED ORDER — ENSURE ENLIVE PO LIQD
237.0000 mL | Freq: Two times a day (BID) | ORAL | Status: DC
  Administered 2020-05-21 – 2020-05-24 (×3): 237 mL via ORAL

## 2020-05-21 MED ORDER — HYDROCHLOROTHIAZIDE 12.5 MG PO CAPS
12.5000 mg | ORAL_CAPSULE | Freq: Every day | ORAL | Status: DC
  Administered 2020-05-21 – 2020-05-24 (×4): 12.5 mg via ORAL
  Filled 2020-05-21 (×4): qty 1

## 2020-05-21 MED ORDER — TRAZODONE HCL 100 MG PO TABS
300.0000 mg | ORAL_TABLET | Freq: Every evening | ORAL | Status: DC | PRN
  Administered 2020-05-21 – 2020-05-23 (×3): 300 mg via ORAL
  Filled 2020-05-21 (×3): qty 3

## 2020-05-21 NOTE — [facility] Group Notes (Signed)
[facility] LCSW Group Therapy Note  Date/Time: 05/21/2020 - 1:00pm  Type of Therapy/Topic:  Group Therapy:  Feelings about Diagnosis  Participation Level:  Active   Mood: Pleasant   Description of Group:    This group will allow patients to explore their thoughts and feelings about diagnoses they have received. Patients will be guided to explore their level of understanding and acceptance of these diagnoses. Facilitator will encourage patients to process their thoughts and feelings about the reactions of others to their diagnosis, and will guide patients in identifying ways to discuss their diagnosis with significant others in their lives. This group will be process-oriented, with patients participating in exploration of their own experiences as well as giving and receiving support and challenge from other group members.   Therapeutic Goals: 1. Patient will demonstrate understanding of diagnosis as evidence by identifying two or more symptoms of the disorder:  2. Patient will be able to express two feelings regarding the diagnosis 3. Patient will demonstrate ability to communicate their needs through discussion and/or role plays  Summary of Patient Progress:    Patient was active and engaged throughout group therapy. Patient colored the entire group, but did discuss his diagnosis of HIV and how that has affected his mental health. Patient reports that he found out he has HIV 17 years ago and became depressed about the news. Patient reports using his fiance as a support who he met 17 years ago. Patient reports that she has HIV and they have been able to be support systems for each other.     Therapeutic Modalities:   Cognitive Behavioral Therapy Brief Therapy Feelings Identification   Ardelle Anton, LCSW

## 2020-05-21 NOTE — Progress Notes (Signed)
Patient is pleasant. Active on the unit observed in the day room interacting well with other patients. Received his medications and tolerated without incident.  Had no pain issues to report and denied SI/HI/AVH during this encounter. Continues to endorse some anxiety and depression related to his situation, but has hope after talking with his worker that his money card will be getting replaced and all of his money is still there. Has plans to find a new boarding house to live in. Patient is safe with 15 minute safety. Informed to contact staff with any concerns.

## 2020-05-21 NOTE — Plan of Care (Signed)
°  Problem: Education: Goal: Knowledge of [facility] Education information/materials will improve Outcome: Progressing Goal: Emotional status will improve Outcome: Progressing Goal: Mental status will improve Outcome: Progressing Goal: Verbalization of understanding the information provided will improve Outcome: Progressing   Problem: Activity: Goal: Interest or engagement in activities will improve Outcome: Progressing Goal: Sleeping patterns will improve Outcome: Progressing   Problem: Coping: Goal: Ability to verbalize frustrations and anger appropriately will improve Outcome: Progressing Goal: Ability to demonstrate self-control will improve Outcome: Progressing   Problem: Health Behavior/Discharge Planning: Goal: Identification of resources available to assist in meeting health care needs will improve Outcome: Progressing Goal: Compliance with treatment plan for underlying cause of condition will improve Outcome: Progressing   Problem: Physical Regulation: Goal: Ability to maintain clinical measurements within normal limits will improve Outcome: Progressing   Problem: Safety: Goal: Periods of time without injury will increase Outcome: Progressing   Problem: Education: Goal: Knowledge of disease or condition will improve Outcome: Progressing Goal: Understanding of discharge needs will improve Outcome: Progressing   Problem: Health Behavior/Discharge Planning: Goal: Ability to identify changes in lifestyle to reduce recurrence of condition will improve Outcome: Progressing Goal: Identification of resources available to assist in meeting health care needs will improve Outcome: Progressing   Problem: Physical Regulation: Goal: Complications related to the disease process, condition or treatment will be avoided or minimized Outcome: Progressing   Problem: Safety: Goal: Ability to remain free from injury will improve Outcome: Progressing   

## 2020-05-21 NOTE — Plan of Care (Signed)
The patient is calm and cooperative.  Problem: Education: Goal: Emotional status will improve Outcome: Progressing Goal: Mental status will improve Outcome: Progressing   

## 2020-05-21 NOTE — Progress Notes (Signed)
P - Secure Housing  D - The patient was pleasant, cooperative, and behaviorally appropriate.  He accepted his medications as ordered and denied thoughts of harming himself.  Aidin consumed adequate food and fluids.  Mood was stable.  The patient utilized the phone for discharge planning.  No significant changes or issues to note.    A - Medications and emotional support provided.  R - Continue with care.

## 2020-05-21 NOTE — Progress Notes (Signed)
[facility] MD Progress Note  05/21/2020 12:10 PM Roy Joyce Gross Carver Sr.  MRN:  [id number] Subjective: Follow-up for this 54 year old man with anxiety and depression and h substance abuse.  Patient is requesting Ensure.  He specifically asked for it although he is listed as being lactose intolerant.  His mood in general was good.  Leg pain is still present.  He is hoping that his bank card comes today.  Denies suicidal thoughts.  No current behavior problems Principal Problem: Adjustment disorder with mixed anxiety and depressed mood Diagnosis: Principal Problem:   Adjustment disorder with mixed anxiety and depressed mood Active Problems:   HIV disease (HCC)   HTN (hypertension)   Dyslipidemia   Cocaine dependence (HCC)   Substance induced mood disorder (HCC)   Bipolar disorder (manic depression) (HCC)   Arthritis pain, hip  Total Time spent with patient: 30 minutes  Past Psychiatric History: Past history of substance abuse  Past Medical History:  Past Medical History:  Diagnosis Date  . AIDS (acquired immune deficiency syndrome) (HCC)   . Arthritis   . Asthma   . Bipolar disorder (HCC)   . Bronchitis   . Complication of anesthesia   . Depression   . Dysrhythmia    1st degree heart block/ brady  . GERD (gastroesophageal reflux disease)   . Hepatitis C    treated  . HIV (human immunodeficiency virus infection) (HCC)   . HTN (hypertension)   . PONV (postoperative nausea and vomiting)     Past Surgical History:  Procedure Laterality Date  . APPLICATION OF WOUND VAC Right 09/01/2019   Procedure: APPLICATION OF WOUND VAC;  Surgeon: Kennedy Bucker, MD;  Location: [facility] ORS;  Service: Orthopedics;  Laterality: Right;  Serial # [id number]  . HERNIA REPAIR Left    inguinal  . TOE SURGERY    . TOE SURGERY Right   . TOTAL HIP ARTHROPLASTY Right 09/01/2019   Procedure: TOTAL HIP ARTHROPLASTY ANTERIOR APPROACH;  Surgeon: Kennedy Bucker, MD;  Location: [facility] ORS;  Service: Orthopedics;  Laterality:  Right;   Family History:  Family History  Problem Relation Age of Onset  . Cancer Brother   . Uterine cancer Mother   . CAD Mother   . Hypertension Mother   . Hyperlipidemia Mother    Family Psychiatric  History: See previous Social History:  Social History   Substance and Sexual Activity  Alcohol Use Yes  . Alcohol/week: 84.0 standard drinks  . Types: 84 Cans of beer per week   Comment: daily     Social History   Substance and Sexual Activity  Drug Use Yes  . Types: Cocaine   Comment: in the last 6 months    Social History   Socioeconomic History  . Marital status: Divorced    Spouse name: Not on file  . Number of children: Not on file  . Years of education: Not on file  . Highest education level: Not on file  Occupational History  . Not on file  Tobacco Use  . Smoking status: Current Every Day Smoker    Packs/day: 0.25    Types: Cigarettes  . Smokeless tobacco: Never Used  Substance and Sexual Activity  . Alcohol use: Yes    Alcohol/week: 84.0 standard drinks    Types: 84 Cans of beer per week    Comment: daily  . Drug use: Yes    Types: Cocaine    Comment: in the last 6 months  . Sexual activity: Yes  Other Topics  Concern  . Not on file  Social History Narrative  . Not on file   Social Determinants of Health   Financial Resource Strain:   . Difficulty of Paying Living Expenses:   Food Insecurity:   . Worried About Charity fundraiser in the Last Year:   . Arboriculturist in the Last Year:   Transportation Needs:   . Film/video editor (Medical):   Marland Kitchen Lack of Transportation (Non-Medical):   Physical Activity:   . Days of Exercise per Week:   . Minutes of Exercise per Session:   Stress:   . Feeling of Stress :   Social Connections:   . Frequency of Communication with Friends and Family:   . Frequency of Social Gatherings with Friends and Family:   . Attends Religious Services:   . Active Member of Clubs or Organizations:   . Attends  Archivist Meetings:   Marland Kitchen Marital Status:    Additional Social History:                         Sleep: Fair  Appetite:  Fair  Current Medications: Current Facility-Administered Medications  Medication Dose Route Frequency Provider Last Rate Last Admin  . acetaminophen (TYLENOL) tablet 650 mg  650 mg Oral Q6H PRN Eulas Post, MD   650 mg at 05/20/20 0853  . albuterol (VENTOLIN HFA) 108 (90 Base) MCG/ACT inhaler 1-2 puff  1-2 puff Inhalation Q4H PRN Traeh Milroy T, MD      . alum & mag hydroxide-simeth (MAALOX/MYLANTA) 200-200-20 MG/5ML suspension 30 mL  30 mL Oral Q4H PRN Eulas Post, MD      . atorvastatin (LIPITOR) tablet 10 mg  10 mg Oral q1800 Jazilyn Siegenthaler, Madie Reno, MD   10 mg at 05/20/20 1700  . bictegravir-emtricitabine-tenofovir AF (BIKTARVY) 50-200-25 MG per tablet 1 tablet  1 tablet Oral Daily Alaira Level, Madie Reno, MD   1 tablet at 05/21/20 0744  . citalopram (CELEXA) tablet 40 mg  40 mg Oral Daily Rahim Astorga, Madie Reno, MD   40 mg at 05/21/20 0743  . feeding supplement (ENSURE ENLIVE) (ENSURE ENLIVE) liquid 237 mL  237 mL Oral BID BM Koty Anctil T, MD      . gabapentin (NEURONTIN) capsule 300 mg  300 mg Oral BID Caylynn Minchew T, MD   300 mg at 05/18/20 1731  . hydrochlorothiazide (MICROZIDE) capsule 12.5 mg  12.5 mg Oral Daily Damiel Barthold T, MD      . hydrOXYzine (ATARAX/VISTARIL) tablet 10 mg  10 mg Oral TID PRN Eulas Post, MD   10 mg at 05/20/20 2110  . magnesium hydroxide (MILK OF MAGNESIA) suspension 30 mL  30 mL Oral Daily PRN Eulas Post, MD      . naproxen (NAPROSYN) tablet 500 mg  500 mg Oral BID WC Christyanna Mckeon, Madie Reno, MD   500 mg at 05/21/20 0743  . traZODone (DESYREL) tablet 150 mg  150 mg Oral QHS PRN Silena Wyss, Madie Reno, MD   150 mg at 05/20/20 2109    Lab Results: No results found for this or any previous visit (from the past 48 hour(s)).  Blood Alcohol level:  Lab Results  Component Value Date   ETH <10 05/15/2020   ETH <10 16/09/9603     Metabolic Disorder Labs: Lab Results  Component Value Date   HGBA1C 5.2 05/27/2018   MPG 102.54 05/27/2018   MPG 114 10/26/2016   No results found for: PROLACTIN  Lab Results  Component Value Date   CHOL 158 01/23/2019   TRIG 63 01/23/2019   HDL 43 01/23/2019   CHOLHDL 3.7 01/23/2019   VLDL 13 01/23/2019   LDLCALC 102 (H) 01/23/2019   LDLCALC 67 05/27/2018    Physical Findings: AIMS:  , ,  ,  ,    CIWA:    COWS:     Musculoskeletal: Strength & Muscle Tone: within normal limits Gait & Station: normal Patient leans: N/A  Psychiatric Specialty Exam: Physical Exam  Nursing note and vitals reviewed. Constitutional: He appears well-developed and well-nourished.  HENT:  Head: Normocephalic and atraumatic.  Eyes: Pupils are equal, round, and reactive to light. Conjunctivae are normal.  Cardiovascular: Regular rhythm and normal heart sounds.  Respiratory: Effort normal.  GI: Soft.  Musculoskeletal:        General: Normal range of motion.     Cervical back: Normal range of motion.  Neurological: He is alert.  Skin: Skin is warm and dry.  Psychiatric: He has a normal mood and affect. His behavior is normal. Judgment and thought content normal.    Review of Systems  Constitutional: Negative.   HENT: Negative.   Eyes: Negative.   Respiratory: Negative.   Cardiovascular: Negative.   Gastrointestinal: Negative.   Musculoskeletal: Negative.   Skin: Negative.   Neurological: Negative.   Psychiatric/Behavioral: Negative.     Blood pressure (!) 145/99, pulse (!) 50, temperature 98 F (36.7 C), temperature source Oral, resp. rate 18, height 5\' 9"  (1.753 m), weight 88.5 kg, SpO2 100 %.Body mass index is 28.81 kg/m.  General Appearance: Casual  Eye Contact:  Good  Speech:  Clear and Coherent  Volume:  Normal  Mood:  Euthymic  Affect:  Congruent  Thought Process:  Goal Directed  Orientation:  Full (Time, Place, and Person)  Thought Content:  Logical  Suicidal  Thoughts:  No  Homicidal Thoughts:  No  Memory:  Immediate;   Fair Recent;   Fair Remote;   Fair  Judgement:  Fair  Insight:  Fair  Psychomotor Activity:  Normal  Concentration:  Concentration: Fair  Recall:  of Knowledge:  Fair  Language:  Fair  Akathisia:  No  Handed:  Right  AIMS (if indicated):     Assets:  Desire for Improvement  ADL's:  Intact  Cognition:  WNL  Sleep:  Number of Hours: 6     Treatment Plan Summary: Daily contact with patient to assess and evaluate symptoms and progress in treatment, Medication management and Plan [name] blood pressure has continued to be high but that is because he has not been receiving his amlodipine.  He is allegedly allergic to it so I will switch it to hydrochlorothiazide so that at least he has some kind of extra blood pressure medicine.  He is also requesting we increase his trazodone dose at night for sleep.  Adding Ensure as noted above.  His specific request.  Psychoeducation and supportive therapy done.  [employer], MD 05/21/2020, 12:10 PM

## 2020-05-22 NOTE — Plan of Care (Signed)
  Problem: Education: Goal: Knowledge of [place] [facility] Education information/materials will improve Outcome: Progressing Goal: Emotional status will improve Outcome: Progressing Goal: Mental status will improve Outcome: Progressing Goal: Verbalization of understanding the information provided will improve Outcome: Progressing   Problem: Activity: Goal: Interest or engagement in activities will improve Outcome: Progressing Goal: Sleeping patterns will improve Outcome: Progressing   

## 2020-05-22 NOTE — Progress Notes (Signed)
F - Secure Housing  D - The patient remains calm and cooperative.  He endorsed low energy levels and poor sleep on his daily PSI-A and accepted medications as ordered.  Mood was stable.     A - Medications provided with emotional support.  No significant changes to note.    R - Continue with care.

## 2020-05-22 NOTE — Plan of Care (Signed)
The patient is calm and cooperative.  Problem: Education: Goal: Emotional status will improve Outcome: Progressing Goal: Mental status will improve Outcome: Progressing   

## 2020-05-22 NOTE — BHH Group Notes (Signed)
LCSW Aftercare Discharge Planning Group Note  05/22/2020  Type of Group and Topic: Psychoeducational Group: Discharge Planning  Participation Level: Did Not Attend  Description of Group  Discharge planning group reviews patient's anticipated discharge plans and assists patients to anticipate and address any barriers to wellness/recovery in the community. Suicide prevention education is reviewed with patients in group.  Therapeutic Goals  1. Patients will state their anticipated discharge plan and mental health aftercare  2. Patients will identify potential barriers to wellness in the community setting  3. Patients will engage in problem solving, solution focused discussion of ways to anticipate and address barriers to wellness/recovery  Summary of Patient Progress  Plan for Discharge/Comments:  Transportation Means:  Supports:  Therapeutic Modalities:  Motivational Interviewing  Mahesh Sizemore C Klynn Linnemann, LCSWA  05/22/2020 10:13 AM 

## 2020-05-22 NOTE — Progress Notes (Signed)
[facility] MD Progress Note  05/22/2020 11:04 AM Roy Zigmund Daniel Coufal Sr.  MRN:  [id number] Subjective: Follow-up for this 54 year old man with adjustment disorder and substance abuse and medical problems.  No new complaints today.  Continues to feel certain that his bank card will arrive in the mail.  Affect stable.  Blood pressure still slightly elevated but he is just now starting a new blood pressure medicine Principal Problem: Adjustment disorder with mixed anxiety and depressed mood Diagnosis: Principal Problem:   Adjustment disorder with mixed anxiety and depressed mood Active Problems:   HIV disease (HCC)   HTN (hypertension)   Dyslipidemia   Cocaine dependence (HCC)   Substance induced mood disorder (HCC)   Bipolar disorder (manic depression) (HCC)   Arthritis pain, hip  Total Time spent with patient: 30 minutes  Past Psychiatric History: Past history of recurrent mood symptoms in the context of substance relapse  Past Medical History:  Past Medical History:  Diagnosis Date  . AIDS (acquired immune deficiency syndrome) ([place])   . Arthritis   . Asthma   . Bipolar disorder ([place])   . Bronchitis   . Complication of anesthesia   . Depression   . Dysrhythmia    1st degree heart block/ brady  . GERD (gastroesophageal reflux disease)   . Hepatitis C    treated  . HIV (human immunodeficiency virus infection) ([place])   . HTN (hypertension)   . PONV (postoperative nausea and vomiting)     Past Surgical History:  Procedure Laterality Date  . APPLICATION OF WOUND VAC Right 09/01/2019   Procedure: APPLICATION OF WOUND VAC;  Surgeon: Hessie Knows, MD;  Location: [facility] ORS;  Service: Orthopedics;  Laterality: Right;  Serial # [id number]  . HERNIA REPAIR Left    inguinal  . TOE SURGERY    . TOE SURGERY Right   . TOTAL HIP ARTHROPLASTY Right 09/01/2019   Procedure: TOTAL HIP ARTHROPLASTY ANTERIOR APPROACH;  Surgeon: Hessie Knows, MD;  Location: [facility] ORS;  Service: Orthopedics;  Laterality:  Right;   Family History:  Family History  Problem Relation Age of Onset  . Cancer Brother   . Uterine cancer Mother   . CAD Mother   . Hypertension Mother   . Hyperlipidemia Mother    Family Psychiatric  History: See previous Social History:  Social History   Substance and Sexual Activity  Alcohol Use Yes  . Alcohol/week: 84.0 standard drinks  . Types: 84 Cans of beer per week   Comment: daily     Social History   Substance and Sexual Activity  Drug Use Yes  . Types: Cocaine   Comment: in the last 6 months    Social History   Socioeconomic History  . Marital status: Divorced    Spouse name: Not on file  . Number of children: Not on file  . Years of education: Not on file  . Highest education level: Not on file  Occupational History  . Not on file  Tobacco Use  . Smoking status: Current Every Day Smoker    Packs/day: 0.25    Types: Cigarettes  . Smokeless tobacco: Never Used  Substance and Sexual Activity  . Alcohol use: Yes    Alcohol/week: 84.0 standard drinks    Types: 84 Cans of beer per week    Comment: daily  . Drug use: Yes    Types: Cocaine    Comment: in the last 6 months  . Sexual activity: Yes  Other Topics Concern  . Not  on file  Social History Narrative  . Not on file   Social Determinants of Health   Financial Resource Strain:   . Difficulty of Paying Living Expenses:   Food Insecurity:   . Worried About Programme researcher, broadcasting/film/video in the Last Year:   . Barista in the Last Year:   Transportation Needs:   . Freight forwarder (Medical):   Marland Kitchen Lack of Transportation (Non-Medical):   Physical Activity:   . Days of Exercise per Week:   . Minutes of Exercise per Session:   Stress:   . Feeling of Stress :   Social Connections:   . Frequency of Communication with Friends and Family:   . Frequency of Social Gatherings with Friends and Family:   . Attends Religious Services:   . Active Member of Clubs or Organizations:   . Attends  Banker Meetings:   Marland Kitchen Marital Status:    Additional Social History:                         Sleep: Fair  Appetite:  Fair  Current Medications: Current Facility-Administered Medications  Medication Dose Route Frequency Provider Last Rate Last Admin  . acetaminophen (TYLENOL) tablet 650 mg  650 mg Oral Q6H PRN Roselind Messier, MD   650 mg at 05/20/20 0853  . albuterol (VENTOLIN HFA) 108 (90 Base) MCG/ACT inhaler 1-2 puff  1-2 puff Inhalation Q4H PRN Rogerick Baldwin T, MD      . alum & mag hydroxide-simeth (MAALOX/MYLANTA) 200-200-20 MG/5ML suspension 30 mL  30 mL Oral Q4H PRN Roselind Messier, MD      . atorvastatin (LIPITOR) tablet 10 mg  10 mg Oral q1800 Kaan Tosh, Jackquline Denmark, MD   10 mg at 05/21/20 1640  . bictegravir-emtricitabine-tenofovir AF (BIKTARVY) 50-200-25 MG per tablet 1 tablet  1 tablet Oral Daily Emaly Boschert, Jackquline Denmark, MD   1 tablet at 05/22/20 0801  . citalopram (CELEXA) tablet 40 mg  40 mg Oral Daily Grisela Mesch, Jackquline Denmark, MD   40 mg at 05/22/20 0803  . feeding supplement (ENSURE ENLIVE) (ENSURE ENLIVE) liquid 237 mL  237 mL Oral BID BM Damariz Paganelli T, MD   237 mL at 05/21/20 1354  . gabapentin (NEURONTIN) capsule 300 mg  300 mg Oral BID Miyoshi Ligas, Jackquline Denmark, MD   300 mg at 05/22/20 0803  . hydrochlorothiazide (MICROZIDE) capsule 12.5 mg  12.5 mg Oral Daily Mirca Yale T, MD   12.5 mg at 05/22/20 0803  . hydrOXYzine (ATARAX/VISTARIL) tablet 10 mg  10 mg Oral TID PRN Roselind Messier, MD   10 mg at 05/20/20 2110  . magnesium hydroxide (MILK OF MAGNESIA) suspension 30 mL  30 mL Oral Daily PRN Roselind Messier, MD      . naproxen (NAPROSYN) tablet 500 mg  500 mg Oral BID WC Caressa Scearce, Jackquline Denmark, MD   500 mg at 05/22/20 0803  . traZODone (DESYREL) tablet 300 mg  300 mg Oral QHS PRN Evva Din, Jackquline Denmark, MD   300 mg at 05/21/20 2134    Lab Results: No results found for this or any previous visit (from the past 48 hour(s)).  Blood Alcohol level:  Lab Results  Component Value Date    ETH <10 05/15/2020   ETH <10 03/21/2019    Metabolic Disorder Labs: Lab Results  Component Value Date   HGBA1C 5.2 05/27/2018   MPG 102.54 05/27/2018   MPG 114 10/26/2016   No results found  for: PROLACTIN Lab Results  Component Value Date   CHOL 158 01/23/2019   TRIG 63 01/23/2019   HDL 43 01/23/2019   CHOLHDL 3.7 01/23/2019   VLDL 13 01/23/2019   LDLCALC 102 (H) 01/23/2019   LDLCALC 67 05/27/2018    Physical Findings: AIMS:  , ,  ,  ,    CIWA:    COWS:     Musculoskeletal: Strength & Muscle Tone: within normal limits Gait & Station: normal Patient leans: N/A  Psychiatric Specialty Exam: Physical Exam  Nursing note and vitals reviewed. Constitutional: He appears well-developed and well-nourished.  HENT:  Head: Normocephalic and atraumatic.  Eyes: Pupils are equal, round, and reactive to light. Conjunctivae are normal.  Cardiovascular: Regular rhythm and normal heart sounds.  Respiratory: Effort normal.  GI: Soft.  Musculoskeletal:        General: Normal range of motion.     Cervical back: Normal range of motion.  Neurological: He is alert.  Skin: Skin is warm and dry.  Psychiatric: He has a normal mood and affect. His behavior is normal. Judgment and thought content normal.    Review of Systems  Constitutional: Negative.   HENT: Negative.   Eyes: Negative.   Respiratory: Negative.   Cardiovascular: Negative.   Gastrointestinal: Negative.   Musculoskeletal: Negative.   Skin: Negative.   Neurological: Negative.   Psychiatric/Behavioral: Negative.     Blood pressure (!) 139/97, pulse (!) 56, temperature 99 F (37.2 C), temperature source Oral, resp. rate 18, height 5\' 9"  (1.753 m), weight 88.5 kg, SpO2 99 %.Body mass index is 28.81 kg/m.  General Appearance: Casual  Eye Contact:  Good  Speech:  Clear and Coherent  Volume:  Normal  Mood:  Euthymic  Affect:  Congruent  Thought Process:  Goal Directed  Orientation:  Full (Time, Place, and Person)   Thought Content:  Logical  Suicidal Thoughts:  No  Homicidal Thoughts:  No  Memory:  Immediate;   Fair  Judgement:  Fair  Insight:  Fair  Psychomotor Activity:  Normal  Concentration:  Concentration: Fair  Recall:  of Knowledge:  Fair  Language:  Fair  Akathisia:  No  Handed:  Right  AIMS (if indicated):     Assets:  Desire for Improvement  ADL's:  Intact  Cognition:  WNL  Sleep:  Number of Hours: 7.5     Treatment Plan Summary: Plan No change to current treatment plan.  Continue monitoring blood pressure.  I am really hoping we can find a way to get him discharged in the next couple days  [employer], MD 05/22/2020, 11:04 AM

## 2020-05-22 NOTE — Progress Notes (Signed)
Patient has been pleasant and cooperative. Monitored patient while he shaved. Denies SI, HI and AVH. Voices no complaints. Interacting appropriately with patients and staff. Requested and received Trazodone at hs for sleep.

## 2020-05-23 MED ORDER — NAPROXEN 500 MG PO TABS: 500.0000 mg | ORAL_TABLET | Freq: Two times a day (BID) | ORAL | 0 refills | Status: DC

## 2020-05-23 MED ORDER — HYDROXYZINE HCL 10 MG PO TABS: 10.0000 mg | ORAL_TABLET | Freq: Three times a day (TID) | ORAL | 1 refills | Status: DC | PRN

## 2020-05-23 MED ORDER — TRAZODONE HCL 300 MG PO TABS: 300.0000 mg | ORAL_TABLET | Freq: Every evening | ORAL | 1 refills | Status: DC | PRN

## 2020-05-23 MED ORDER — ATORVASTATIN CALCIUM 10 MG PO TABS: 10.0000 mg | ORAL_TABLET | Freq: Every day | ORAL | 1 refills | Status: DC

## 2020-05-23 MED ORDER — GABAPENTIN 300 MG PO CAPS: 300.0000 mg | ORAL_CAPSULE | Freq: Two times a day (BID) | ORAL | 1 refills | Status: DC

## 2020-05-23 MED ORDER — HYDROCHLOROTHIAZIDE 12.5 MG PO CAPS: 12.5000 mg | ORAL_CAPSULE | Freq: Every day | ORAL | 1 refills | Status: DC

## 2020-05-23 MED ORDER — CITALOPRAM HYDROBROMIDE 40 MG PO TABS: 40.0000 mg | ORAL_TABLET | Freq: Every day | ORAL | 1 refills | Status: DC

## 2020-05-23 MED ORDER — BIKTARVY 50-200-25 MG PO TABS: 1.0000 | ORAL_TABLET | Freq: Every day | ORAL | 1 refills | Status: DC

## 2020-05-23 MED ORDER — ALBUTEROL SULFATE HFA 108 (90 BASE) MCG/ACT IN AERS: 1.0000 | INHALATION_SPRAY | RESPIRATORY_TRACT | 1 refills | Status: AC | PRN

## 2020-05-23 NOTE — Progress Notes (Signed)
Recreation Therapy Notes  Date: 05/23/2020  Time: 9:30 am  Location: Craft room  Behavioral response: Appropriate  Intervention Topic: Goals   Discussion/Intervention:  Group content on today was focused on goals. Patients described what goals are and how they define goals. Individuals expressed how they go about setting goals and reaching them. The group identified how important goals are and if they make short term goals to reach long term goals. Patients described how many goals they work on at a time and what affects them not reaching their goal. Individuals described how much time they put into planning and obtaining their goals. The group participated in the intervention "My Goal Board" and made personal goal boards to help them achieve their goal. Clinical Observations/Feedback:  Patient came to group and defined goals as accomplishments. He stated that his goal is to be well off and healthy. Individual was social with peers and staff while participating on the intervention.  Denielle Bayard LRT/CTRS         Gissella Niblack 05/23/2020 12:39 PM

## 2020-05-23 NOTE — BHH Counselor (Signed)
CSW spoke with pt to discuss discharge plan. Pt states he is hoping to receive his debit card today, reports it will be delivered to the hospital. Pt says he would like to reside in [facility] because he would like to stay close to his family. Pt reports he has reached out to several boarding houses and plans to make follow up calls today for room availability.

## 2020-05-23 NOTE — BHH Group Notes (Signed)
Emotional Regulation 05/23/2020 9:30AM/1PM  Type of Therapy/Topic:  Group Therapy:  Emotion Regulation  Participation Level:  Active   Description of Group:   The purpose of this group is to assist patients in learning to regulate negative emotions and experience positive emotions. Patients will be guided to discuss ways in which they have been vulnerable to their negative emotions. These vulnerabilities will be juxtaposed with experiences of positive emotions or situations, and patients will be challenged to use positive emotions to combat negative ones. Special emphasis will be placed on coping with negative emotions in conflict situations, and patients will process healthy conflict resolution skills.  Therapeutic Goals: 1. Patient will identify two positive emotions or experiences to reflect on in order to balance out negative emotions 2. Patient will label two or more emotions that they find the most difficult to experience 3. Patient will demonstrate positive conflict resolution skills through discussion and/or role plays  Summary of Patient Progress: Patient actively and appropriately participated in group exercise. Patient discussed with group, losing his debit card and not being able to afford housing. Patient used the group exercise on the cognitive triangle to provide examples of the thoughts, emotions and behaviors he displayed as a result of the lost debit card. Patient demonstrated understanding of the subject matter and respected boundaries during session.     Therapeutic Modalities:   Cognitive Behavioral Therapy Feelings Identification Dialectical Behavioral Therapy   Suzan Slick, LCSW 05/23/2020 2:30 PM

## 2020-05-23 NOTE — Progress Notes (Signed)
[facility] MD Progress Note  05/23/2020 2:57 PM Roy Joyce Gross Willow Sr.  MRN:  [id number] Subjective: Follow-up for this gentleman with substance abuse and mood symptoms.  He is optimistic today that he will get his bank card in the mail.  He has identified a couple of boardinghouse rooms that he could rent.  No new complaints.  Mood upbeat. Principal Problem: Adjustment disorder with mixed anxiety and depressed mood Diagnosis: Principal Problem:   Adjustment disorder with mixed anxiety and depressed mood Active Problems:   HIV disease (HCC)   HTN (hypertension)   Dyslipidemia   Cocaine dependence (HCC)   Substance induced mood disorder (HCC)   Bipolar disorder (manic depression) (HCC)   Arthritis pain, hip  Total Time spent with patient: 20 minutes  Past Psychiatric History: Past history of recurrent substance abuse and mood complaints  Past Medical History:  Past Medical History:  Diagnosis Date  . AIDS (acquired immune deficiency syndrome) (HCC)   . Arthritis   . Asthma   . Bipolar disorder (HCC)   . Bronchitis   . Complication of anesthesia   . Depression   . Dysrhythmia    1st degree heart block/ brady  . GERD (gastroesophageal reflux disease)   . Hepatitis C    treated  . HIV (human immunodeficiency virus infection) (HCC)   . HTN (hypertension)   . PONV (postoperative nausea and vomiting)     Past Surgical History:  Procedure Laterality Date  . APPLICATION OF WOUND VAC Right 09/01/2019   Procedure: APPLICATION OF WOUND VAC;  Surgeon: Kennedy Bucker, MD;  Location: [facility] ORS;  Service: Orthopedics;  Laterality: Right;  Serial # [id number]  . HERNIA REPAIR Left    inguinal  . TOE SURGERY    . TOE SURGERY Right   . TOTAL HIP ARTHROPLASTY Right 09/01/2019   Procedure: TOTAL HIP ARTHROPLASTY ANTERIOR APPROACH;  Surgeon: Kennedy Bucker, MD;  Location: [facility] ORS;  Service: Orthopedics;  Laterality: Right;   Family History:  Family History  Problem Relation Age of Onset  . Cancer  Brother   . Uterine cancer Mother   . CAD Mother   . Hypertension Mother   . Hyperlipidemia Mother    Family Psychiatric  History: See previous Social History:  Social History   Substance and Sexual Activity  Alcohol Use Yes  . Alcohol/week: 84.0 standard drinks  . Types: 84 Cans of beer per week   Comment: daily     Social History   Substance and Sexual Activity  Drug Use Yes  . Types: Cocaine   Comment: in the last 6 months    Social History   Socioeconomic History  . Marital status: Divorced    Spouse name: Not on file  . Number of children: Not on file  . Years of education: Not on file  . Highest education level: Not on file  Occupational History  . Not on file  Tobacco Use  . Smoking status: Current Every Day Smoker    Packs/day: 0.25    Types: Cigarettes  . Smokeless tobacco: Never Used  Substance and Sexual Activity  . Alcohol use: Yes    Alcohol/week: 84.0 standard drinks    Types: 84 Cans of beer per week    Comment: daily  . Drug use: Yes    Types: Cocaine    Comment: in the last 6 months  . Sexual activity: Yes  Other Topics Concern  . Not on file  Social History Narrative  . Not on file  Social Determinants of Health   Financial Resource Strain:   . Difficulty of Paying Living Expenses:   Food Insecurity:   . Worried About Charity fundraiser in the Last Year:   . Arboriculturist in the Last Year:   Transportation Needs:   . Film/video editor (Medical):   Marland Kitchen Lack of Transportation (Non-Medical):   Physical Activity:   . Days of Exercise per Week:   . Minutes of Exercise per Session:   Stress:   . Feeling of Stress :   Social Connections:   . Frequency of Communication with Friends and Family:   . Frequency of Social Gatherings with Friends and Family:   . Attends Religious Services:   . Active Member of Clubs or Organizations:   . Attends Archivist Meetings:   Marland Kitchen Marital Status:    Additional Social History:                          Sleep: Fair  Appetite:  Fair  Current Medications: Current Facility-Administered Medications  Medication Dose Route Frequency Provider Last Rate Last Admin  . acetaminophen (TYLENOL) tablet 650 mg  650 mg Oral Q6H PRN Eulas Post, MD   650 mg at 05/20/20 0853  . albuterol (VENTOLIN HFA) 108 (90 Base) MCG/ACT inhaler 1-2 puff  1-2 puff Inhalation Q4H PRN Levina Boyack T, MD      . alum & mag hydroxide-simeth (MAALOX/MYLANTA) 200-200-20 MG/5ML suspension 30 mL  30 mL Oral Q4H PRN Eulas Post, MD      . atorvastatin (LIPITOR) tablet 10 mg  10 mg Oral q1800 Eliott Amparan, Madie Reno, MD   10 mg at 05/22/20 1628  . bictegravir-emtricitabine-tenofovir AF (BIKTARVY) 50-200-25 MG per tablet 1 tablet  1 tablet Oral Daily Lauretta Sallas, Madie Reno, MD   1 tablet at 05/23/20 1054  . citalopram (CELEXA) tablet 40 mg  40 mg Oral Daily Jadon Ressler, Madie Reno, MD   40 mg at 05/23/20 1054  . feeding supplement (ENSURE ENLIVE) (ENSURE ENLIVE) liquid 237 mL  237 mL Oral BID BM Samvel Zinn T, MD   237 mL at 05/23/20 1056  . gabapentin (NEURONTIN) capsule 300 mg  300 mg Oral BID Dontrez Pettis, Madie Reno, MD   300 mg at 05/22/20 0803  . hydrochlorothiazide (MICROZIDE) capsule 12.5 mg  12.5 mg Oral Daily Pierrette Scheu T, MD   12.5 mg at 05/23/20 1054  . hydrOXYzine (ATARAX/VISTARIL) tablet 10 mg  10 mg Oral TID PRN Eulas Post, MD   10 mg at 05/20/20 2110  . magnesium hydroxide (MILK OF MAGNESIA) suspension 30 mL  30 mL Oral Daily PRN Eulas Post, MD      . naproxen (NAPROSYN) tablet 500 mg  500 mg Oral BID WC Jenafer Winterton, Madie Reno, MD   500 mg at 05/23/20 1054  . traZODone (DESYREL) tablet 300 mg  300 mg Oral QHS PRN Darrik Richman, Madie Reno, MD   300 mg at 05/22/20 2131    Lab Results: No results found for this or any previous visit (from the past 48 hour(s)).  Blood Alcohol level:  Lab Results  Component Value Date   ETH <10 05/15/2020   ETH <10 60/63/0160    Metabolic Disorder Labs: Lab Results   Component Value Date   HGBA1C 5.2 05/27/2018   MPG 102.54 05/27/2018   MPG 114 10/26/2016   No results found for: PROLACTIN Lab Results  Component Value Date   CHOL 158 01/23/2019  TRIG 63 01/23/2019   HDL 43 01/23/2019   CHOLHDL 3.7 01/23/2019   VLDL 13 01/23/2019   LDLCALC 102 (H) 01/23/2019   LDLCALC 67 05/27/2018    Physical Findings: AIMS:  , ,  ,  ,    CIWA:    COWS:     Musculoskeletal: Strength & Muscle Tone: within normal limits Gait & Station: normal Patient leans: N/A  Psychiatric Specialty Exam: Physical Exam  Nursing note and vitals reviewed. Constitutional: He appears well-developed and well-nourished.  HENT:  Head: Normocephalic and atraumatic.  Eyes: Pupils are equal, round, and reactive to light. Conjunctivae are normal.  Cardiovascular: Regular rhythm and normal heart sounds.  Respiratory: Effort normal.  GI: Soft.  Musculoskeletal:        General: Normal range of motion.     Cervical back: Normal range of motion.  Neurological: He is alert.  Skin: Skin is warm and dry.  Psychiatric: He has a normal mood and affect. His behavior is normal. Judgment and thought content normal.    Review of Systems  Constitutional: Negative.   HENT: Negative.   Eyes: Negative.   Respiratory: Negative.   Cardiovascular: Negative.   Gastrointestinal: Negative.   Musculoskeletal: Negative.   Skin: Negative.   Neurological: Negative.   Psychiatric/Behavioral: Negative.     Blood pressure (!) 144/90, pulse (!) 59, temperature 98.6 F (37 C), temperature source Oral, resp. rate 17, height 5\' 9"  (1.753 m), weight 88.5 kg, SpO2 100 %.Body mass index is 28.81 kg/m.  General Appearance: Casual  Eye Contact:  Good  Speech:  Clear and Coherent  Volume:  Normal  Mood:  Euthymic  Affect:  Congruent  Thought Process:  Goal Directed  Orientation:  Full (Time, Place, and Person)  Thought Content:  Logical  Suicidal Thoughts:  No  Homicidal Thoughts:  No  Memory:   Immediate;   Fair Recent;   Fair Remote;   Fair  Judgement:  Fair  Insight:  Fair  Psychomotor Activity:  Normal  Concentration:  Concentration: Fair  Recall:  of Knowledge:  Fair  Language:  Fair  Akathisia:  No  Handed:  Right  AIMS (if indicated):     Assets:  Desire for Improvement  ADL's:  Intact  Cognition:  WNL  Sleep:  Number of Hours: 6.5     Treatment Plan Summary: Plan As soon as he gets his bank card we will plan for discharge with referral for outpatient follow-up through [facility].  [employer], MD 05/23/2020, 2:57 PM

## 2020-05-23 NOTE — Progress Notes (Signed)
   05/23/20 1440  Clinical Encounter Type  Visited With Patient  Visit Type Initial;Spiritual support;Social support;Behavioral Health  Referral From Chaplain  Consult/Referral To Chaplain  Ch talked to Pt while rounding BH unit. Pt was putting together a puzzle. Pt want to talk to Ch. Pt told me he was getting ready to go home. Ch invited PT to group tomorrow if he is still her. Ch will follow-up with Pt.

## 2020-05-23 NOTE — [facility] Suicide Risk Assessment (Signed)
[facility] Discharge Suicide Risk Assessment   Principal Problem: Adjustment disorder with mixed anxiety and depressed mood Discharge Diagnoses: Principal Problem:   Adjustment disorder with mixed anxiety and depressed mood Active Problems:   HIV disease (HCC)   HTN (hypertension)   Dyslipidemia   Cocaine dependence (HCC)   Substance induced mood disorder (HCC)   Bipolar disorder (manic depression) (HCC)   Arthritis pain, hip   Total Time spent with patient: 30 minutes  Musculoskeletal: Strength & Muscle Tone: within normal limits Gait & Station: normal Patient leans: N/A  Psychiatric Specialty Exam: Review of Systems  Constitutional: Negative.   HENT: Negative.   Eyes: Negative.   Respiratory: Negative.   Cardiovascular: Negative.   Gastrointestinal: Negative.   Musculoskeletal: Negative.   Skin: Negative.   Neurological: Negative.   Psychiatric/Behavioral: Negative.     Blood pressure (!) 144/90, pulse (!) 59, temperature 98.6 F (37 C), temperature source Oral, resp. rate 17, height 5\' 9"  (1.753 m), weight 88.5 kg, SpO2 100 %.Body mass index is 28.81 kg/m.  General Appearance: Casual  Eye Contact::  Good  Speech:  Clear and Coherent409  Volume:  Normal  Mood:  Euthymic  Affect:  Congruent  Thought Process:  Goal Directed  Orientation:  Full (Time, Place, and Person)  Thought Content:  Logical  Suicidal Thoughts:  No  Homicidal Thoughts:  No  Memory:  Immediate;   Fair Recent;   Fair Remote;   Fair  Judgement:  Fair  Insight:  Fair  Psychomotor Activity:  Normal  Concentration:  Fair  Recall:  [phone] of Knowledge:Fair  Language: Fair  Akathisia:  No  Handed:  Right  AIMS (if indicated):     Assets:  Desire for Improvement Resilience  Sleep:  Number of Hours: 6.5  Cognition: WNL  ADL's:  Intact   Mental Status Per Nursing Assessment::   On Admission:  Suicidal ideation indicated by patient  Demographic Factors:  Male  Loss Factors: Financial  problems/change in socioeconomic status  Historical Factors: Impulsivity  Risk Reduction Factors:   Positive therapeutic relationship  Continued Clinical Symptoms:  Depression:   Impulsivity Alcohol/Substance Abuse/Dependencies  Cognitive Features That Contribute To Risk:  None    Suicide Risk:  Minimal: No identifiable suicidal ideation.  Patients presenting with no risk factors but with morbid ruminations; may be classified as minimal risk based on the severity of the depressive symptoms  Follow-up Information    [facility] Follow up on 06/02/2020.   Why: You are scheduled to speak with 06/04/2020, peer support specialist via phone on Thursday, June 17th at 7am. Thank You. Contact information: [address] [place] [phone]           Plan Of Care/Follow-up recommendations:  Activity:  Activity as tolerated Diet:  Regular diet Other:  Follow-up with outpatient treatment at [facility]  [facility], MD 05/23/2020, 3:02 PM

## 2020-05-23 NOTE — Plan of Care (Signed)
  Problem: Education: Goal: Knowledge of [facility] Education information/materials will improve Outcome: Progressing Goal: Emotional status will improve Outcome: Progressing Goal: Mental status will improve Outcome: Progressing Goal: Verbalization of understanding the information provided will improve Outcome: Progressing   Problem: Activity: Goal: Interest or engagement in activities will improve Outcome: Progressing Goal: Sleeping patterns will improve Outcome: Progressing   

## 2020-05-23 NOTE — BHH Group Notes (Signed)
[facility] Group Notes:  (Nursing/MHT/Case Management/Adjunct)  Date:  05/23/2020  Time:  9:19 PM  Type of Therapy:  Group Therapy  Participation Level:  Active  Participation Quality:  Appropriate  Affect:  Appropriate  Cognitive:  Alert  Insight:  Good  Engagement in Group:  Engaged and talk about leaving tomorrow and he got his card and would like to go to a rehabilitation center program.  Modes of Intervention:  Support  Summary of Progress/Problems:  Mayra Neer 05/23/2020, 9:19 PM

## 2020-05-23 NOTE — Progress Notes (Signed)
The patient has been med compliant on the unit. He has been interacting well in the milieu and during groups. Patient has been noted to be interacting with his peers. Denies SI/HI AVH.

## 2020-05-23 NOTE — Progress Notes (Signed)
Patient has been pleasant and cooperative. Denies SI, HI AVH or withdrawal symptoms. Affect is bright. Interacting well with staff and peers. Requested Trazodone 300 mg at hs and slept through the night

## 2020-05-23 NOTE — Care Plan (Signed)
  Problem: Education: Goal: Knowledge of [facility] Education information/materials will improve Outcome: Progressing Goal: Emotional status will improve Outcome: Progressing Goal: Mental status will improve Outcome: Progressing Goal: Verbalization of understanding the information provided will improve Outcome: Progressing   Problem: Activity: Goal: Interest or engagement in activities will improve Outcome: Progressing Goal: Sleeping patterns will improve Outcome: Progressing   

## 2020-05-24 ENCOUNTER — Other Ambulatory Visit: Payer: Self-pay

## 2020-05-24 ENCOUNTER — Encounter
Admit: 2020-05-24 | Discharge: 2020-05-24 | Disposition: A | Discharge status: Home / Self Care | Payer: Medicaid Other | Attending: Orthopedic Surgery | Admitting: Orthopedic Surgery

## 2020-05-24 NOTE — Plan of Care (Signed)
Pt rates anxiety, depression and hopelessness all 10/10. Pt denies SI, HI and AVH. Pt was educated on care plan and verbalizes understanding. Pt was encouraged to attend groups. Torrie Mayers RN Problem: Education: Goal: Knowledge of [facility] Education information/materials will improve Outcome: Adequate for Discharge Goal: Emotional status will improve Outcome: Adequate for Discharge Goal: Mental status will improve Outcome: Adequate for Discharge Goal: Verbalization of understanding the information provided will improve Outcome: Adequate for Discharge   Problem: Activity: Goal: Interest or engagement in activities will improve Outcome: Adequate for Discharge Goal: Sleeping patterns will improve Outcome: Adequate for Discharge   Problem: Coping: Goal: Ability to verbalize frustrations and anger appropriately will improve Outcome: Adequate for Discharge Goal: Ability to demonstrate self-control will improve Outcome: Adequate for Discharge   Problem: Health Behavior/Discharge Planning: Goal: Identification of resources available to assist in meeting health care needs will improve Outcome: Adequate for Discharge Goal: Compliance with treatment plan for underlying cause of condition will improve Outcome: Adequate for Discharge   Problem: Physical Regulation: Goal: Ability to maintain clinical measurements within normal limits will improve Outcome: Adequate for Discharge   Problem: Safety: Goal: Periods of time without injury will increase Outcome: Adequate for Discharge   Problem: Education: Goal: Knowledge of disease or condition will improve Outcome: Adequate for Discharge Goal: Understanding of discharge needs will improve Outcome: Adequate for Discharge   Problem: Health Behavior/Discharge Planning: Goal: Ability to identify changes in lifestyle to reduce recurrence of condition will improve Outcome: Adequate for Discharge Goal: Identification of resources  available to assist in meeting health care needs will improve Outcome: Adequate for Discharge   Problem: Physical Regulation: Goal: Complications related to the disease process, condition or treatment will be avoided or minimized Outcome: Adequate for Discharge   Problem: Safety: Goal: Ability to remain free from injury will improve Outcome: Adequate for Discharge

## 2020-05-24 NOTE — Discharge Summary (Signed)
Physician Discharge Summary Note  Patient:  Roy Ardoin Sr. is an 54 y.o., male MRN:  [id number] DOB:  26-Mar-1966 Patient phone:  [phone] (home)  Patient address:   [place] [address],  Total Time spent with patient: 30 minutes  Date of Admission:  05/18/2020 Date of Discharge: 05/24/2020  Reason for Admission: Admitted because of presentation with reports of suicidal ideation and depression in the context of substance abuse and homelessness  Principal Problem: Adjustment disorder with mixed anxiety and depressed mood Discharge Diagnoses: Principal Problem:   Adjustment disorder with mixed anxiety and depressed mood Active Problems:   HIV disease ([place])   HTN (hypertension)   Dyslipidemia   Cocaine dependence ([place])   Substance induced mood disorder ([place])   Bipolar disorder (manic depression) ([place])   Arthritis pain, hip   Past Psychiatric History: Past history of similar presentations chronic substance abuse issues multiple medical problems  Past Medical History:  Past Medical History:  Diagnosis Date  . AIDS (acquired immune deficiency syndrome) ([place])   . Arthritis   . Asthma   . Bipolar disorder ([place])   . Bronchitis   . Complication of anesthesia   . Depression   . Dysrhythmia    1st degree heart block/ brady  . GERD (gastroesophageal reflux disease)   . Hepatitis C    treated  . HIV (human immunodeficiency virus infection) ([place])   . HTN (hypertension)   . PONV (postoperative nausea and vomiting)     Past Surgical History:  Procedure Laterality Date  . APPLICATION OF WOUND VAC Right 09/01/2019   Procedure: APPLICATION OF WOUND VAC;  Surgeon: Hessie Knows, MD;  Location: [facility] ORS;  Service: Orthopedics;  Laterality: Right;  Serial # [id number]  . HERNIA REPAIR Left    inguinal  . TOE SURGERY    . TOE SURGERY Right   . TOTAL HIP ARTHROPLASTY Right 09/01/2019   Procedure: TOTAL HIP ARTHROPLASTY ANTERIOR APPROACH;  Surgeon: Hessie Knows, MD;   Location: [facility] ORS;  Service: Orthopedics;  Laterality: Right;   Family History:  Family History  Problem Relation Age of Onset  . Cancer Brother   . Uterine cancer Mother   . CAD Mother   . Hypertension Mother   . Hyperlipidemia Mother    Family Psychiatric  History: See previous. Social History:  Social History   Substance and Sexual Activity  Alcohol Use Yes  . Alcohol/week: 84.0 standard drinks  . Types: 84 Cans of beer per week   Comment: daily     Social History   Substance and Sexual Activity  Drug Use Yes  . Types: Cocaine   Comment: in the last 6 months    Social History   Socioeconomic History  . Marital status: Divorced    Spouse name: Not on file  . Number of children: Not on file  . Years of education: Not on file  . Highest education level: Not on file  Occupational History  . Not on file  Tobacco Use  . Smoking status: Current Every Day Smoker    Packs/day: 0.25    Types: Cigarettes  . Smokeless tobacco: Never Used  Substance and Sexual Activity  . Alcohol use: Yes    Alcohol/week: 84.0 standard drinks    Types: 84 Cans of beer per week    Comment: daily  . Drug use: Yes    Types: Cocaine    Comment: in the last 6 months  . Sexual activity: Yes  Other Topics Concern  .  Not on file  Social History Narrative  . Not on file   Social Determinants of Health   Financial Resource Strain:   . Difficulty of Paying Living Expenses:   Food Insecurity:   . Worried About Charity fundraiser in the Last Year:   . Arboriculturist in the Last Year:   Transportation Needs:   . Film/video editor (Medical):   Marland Kitchen Lack of Transportation (Non-Medical):   Physical Activity:   . Days of Exercise per Week:   . Minutes of Exercise per Session:   Stress:   . Feeling of Stress :   Social Connections:   . Frequency of Communication with Friends and Family:   . Frequency of Social Gatherings with Friends and Family:   . Attends Religious Services:   .  Active Member of Clubs or Organizations:   . Attends Archivist Meetings:   Marland Kitchen Marital Status:     Hospital Course: Patient needed minimal supervision or treatment for detox.  He engaged appropriately in treatment on the unit.  Displayed no dangerous behavior.  Soon after hospitalization began to denies suicidal ideation particularly when progress was made on discharge planning.  Medically he was continued on his HIV medicine and given as needed nonsteroidal medicine for his hip pain.  His hip surgery has been rescheduled for later this week.  Patient now has his money available and is planning to stay at a group home.  He met with [facility] and is agreeable to outpatient treatment  Physical Findings: AIMS:  , ,  ,  ,    CIWA:    COWS:     Musculoskeletal: Strength & Muscle Tone: within normal limits Gait & Station: normal Patient leans: N/A  Psychiatric Specialty Exam: Physical Exam  Nursing note and vitals reviewed. Constitutional: He appears well-developed and well-nourished.  HENT:  Head: Normocephalic and atraumatic.  Eyes: Pupils are equal, round, and reactive to light. Conjunctivae are normal.  Cardiovascular: Regular rhythm and normal heart sounds.  Respiratory: Effort normal. No respiratory distress.  GI: Soft.  Musculoskeletal:        General: Normal range of motion.     Cervical back: Normal range of motion.  Neurological: He is alert.  Skin: Skin is warm and dry.  Psychiatric: He has a normal mood and affect. His behavior is normal. Judgment and thought content normal.    Review of Systems  Constitutional: Negative.   HENT: Negative.   Eyes: Negative.   Respiratory: Negative.   Cardiovascular: Negative.   Gastrointestinal: Negative.   Musculoskeletal: Negative.   Skin: Negative.   Neurological: Negative.   Psychiatric/Behavioral: Negative.     Blood pressure (!) 146/98, pulse (!) 53, temperature 98.8 F (37.1 C), temperature source Oral, resp. rate 18,  height 5' 9" (1.753 m), weight 88.5 kg, SpO2 99 %.Body mass index is 28.81 kg/m.  General Appearance: Casual  Eye Contact:  Good  Speech:  Clear and Coherent  Volume:  Normal  Mood:  Euthymic  Affect:  Congruent  Thought Process:  Goal Directed  Orientation:  Full (Time, Place, and Person)  Thought Content:  WDL  Suicidal Thoughts:  No  Homicidal Thoughts:  No  Memory:  Immediate;   Good Recent;   Good Remote;   Good  Judgement:  Fair  Insight:  Fair  Psychomotor Activity:  Normal  Concentration:  Concentration: Fair  Recall:  [employer] of Knowledge:  Fair  Language:  Fair  Akathisia:  No  Handed:  Right  AIMS (if indicated):     Assets:  Desire for Improvement  ADL's:  Intact  Cognition:  WNL  Sleep:  Number of Hours: 7     Have you used any form of tobacco in the last 30 days? (Cigarettes, Smokeless Tobacco, Cigars, and/or Pipes): Yes  Has this patient used any form of tobacco in the last 30 days? (Cigarettes, Smokeless Tobacco, Cigars, and/or Pipes) Yes, No  Blood Alcohol level:  Lab Results  Component Value Date   ETH <10 05/15/2020   ETH <10 43/15/4008    Metabolic Disorder Labs:  Lab Results  Component Value Date   HGBA1C 5.2 05/27/2018   MPG 102.54 05/27/2018   MPG 114 10/26/2016   No results found for: PROLACTIN Lab Results  Component Value Date   CHOL 158 01/23/2019   TRIG 63 01/23/2019   HDL 43 01/23/2019   CHOLHDL 3.7 01/23/2019   VLDL 13 01/23/2019   LDLCALC 102 (H) 01/23/2019   LDLCALC 67 05/27/2018    See Psychiatric Specialty Exam and Suicide Risk Assessment completed by Attending Physician prior to discharge.  Discharge destination:  Home  Is patient on multiple antipsychotic therapies at discharge:  No   Has Patient had three or more failed trials of antipsychotic monotherapy by history:  No  Recommended Plan for Multiple Antipsychotic Therapies: NA  Discharge Instructions    Diet - low sodium heart healthy   Complete by: As  directed    Increase activity slowly   Complete by: As directed      Allergies as of 05/24/2020      Reactions   Amlodipine Swelling   Of the tongue   Lisinopril Swelling   Lactose Other (See Comments)   GI distress   Pollen Extract Other (See Comments)   Itchy eyes and runny nose      Medication List    STOP taking these medications   amLODipine 10 MG tablet Commonly known as: NORVASC   enoxaparin 40 MG/0.4ML injection Commonly known as: LOVENOX   HYDROcodone-acetaminophen 5-325 MG tablet Commonly known as: Norco   lactase 3000 units tablet Commonly known as: LACTAID   oxyCODONE 5 MG immediate release tablet Commonly known as: Oxy IR/ROXICODONE   oxyCODONE-acetaminophen 5-325 MG tablet Commonly known as: Percocet   oxymetazoline 0.05 % nasal spray Commonly known as: AFRIN     TAKE these medications     Indication  albuterol 108 (90 Base) MCG/ACT inhaler Commonly known as: VENTOLIN HFA Inhale 1-2 puffs into the lungs every 4 (four) hours as needed for wheezing or shortness of breath.  Indication: Asthma   atorvastatin 10 MG tablet Commonly known as: LIPITOR Take 1 tablet (10 mg total) by mouth daily at 6 PM. What changed: additional instructions  Indication: High Amount of Fats in the Blood   Biktarvy 50-200-25 MG Tabs tablet Generic drug: bictegravir-emtricitabine-tenofovir AF Take 1 tablet by mouth daily. What changed: additional instructions  Indication: HIV Disease   citalopram 40 MG tablet Commonly known as: CELEXA Take 1 tablet (40 mg total) by mouth daily. What changed: additional instructions  Indication: Depression, Generalized Anxiety Disorder, Mood   gabapentin 300 MG capsule Commonly known as: NEURONTIN Take 1 capsule (300 mg total) by mouth 2 (two) times daily.  Indication: Abuse or Misuse of Alcohol   hydrochlorothiazide 12.5 MG capsule Commonly known as: MICROZIDE Take 1 capsule (12.5 mg total) by mouth daily.  Indication: High  Blood Pressure Disorder   hydrOXYzine 10  MG tablet Commonly known as: ATARAX/VISTARIL Take 1 tablet (10 mg total) by mouth 3 (three) times daily as needed for anxiety.  Indication: Feeling Anxious   naproxen 500 MG tablet Commonly known as: NAPROSYN Take 1 tablet (500 mg total) by mouth 2 (two) times daily with a meal.  Indication: Joint Damage causing Pain and Loss of Function   trazodone 300 MG tablet Commonly known as: DESYREL Take 1 tablet (300 mg total) by mouth at bedtime as needed for sleep. What changed:   medication strength  how much to take  Indication: [place] Follow up on 06/02/2020.   Why: You are scheduled to speak with Sherrian Divers, peer support specialist via phone on Thursday, June 17th at [place]. Contact information: [place] [address] [phone]           Follow-up recommendations:  Activity:  Activity as tolerated Diet:  Regular diet Other:  Follow-up with outpatient medical and psychiatric care  Comments: Prescriptions provided at discharge  Signed: Alethia Berthold, MD 05/24/2020, 9:45 AM

## 2020-05-24 NOTE — Progress Notes (Signed)
  [facility] Adult Case Management Discharge Plan :  Will you be returning to the same living situation after discharge:  Yes,  pt reports that he is returning to the home with his girlfriend.  From there he plans on going to the boarding house ofice to pay his deposit. At discharge, do you have transportation home?: Yes,  CSW will assist patient with bus pass. Do you have the ability to pay for your medications: Yes,  [place] [employer].  Release of information consent forms completed and in the chart;  [name] signature needed at discharge.  Patient to Follow up at: Follow-up Information    [facility] Follow up on 06/02/2020.   Why: You are scheduled to speak with Unk Pinto, peer support specialist via phone on Thursday, June 17th at 7am. Thank You. Contact information: [address] [place] [address] [phone]           Next level of care provider has access to [facility] Link:no  Safety Planning and Suicide Prevention discussed: Yes,  SPE completed with pt and pt's fiance.  Have you used any form of tobacco in the last 30 days? (Cigarettes, Smokeless Tobacco, Cigars, and/or Pipes): Yes  Has patient been referred to the Quitline?: Patient refused referral  Patient has been referred for addiction treatment: Pt. refused referral  [name], LCSW 05/24/2020, 10:04 AM

## 2020-05-24 NOTE — Plan of Care (Signed)
  Problem: Group Participation Goal: STG - Patient will engage in groups without prompting or encouragement from LRT x3 group sessions within 5 recreation therapy group sessions Description: STG - Patient will engage in groups without prompting or encouragement from LRT x3 group sessions within 5 recreation therapy group sessions Outcome: Completed/Met

## 2020-05-24 NOTE — Progress Notes (Signed)
Pt denies SI, HI and AVH. Pt was educated on dc plan and verbalizes understanding. Pt was given prescriptions, belongings and dc packet. Pt was given a bus pass and taken to the bus stop. Torrie Mayers RN

## 2020-05-24 NOTE — Progress Notes (Signed)
Recreation Therapy Notes  INPATIENT RECREATION TR PLAN  Patient Details Name: Roy [name] MRN: [id number] DOB: 09/28/1966 Today's Date: 05/24/2020  Rec Therapy Plan Is patient appropriate for Therapeutic Recreation?: Yes Treatment times per week: at least 3 Estimated Length of Stay: 5-7 days TR Treatment/Interventions: Group participation (Comment)  Discharge Criteria Pt will be discharged from therapy if:: Discharged Treatment plan/goals/alternatives discussed and agreed upon by:: Patient/family  Discharge Summary Short term goals set: Patient will engage in groups without prompting or encouragement from LRT x3 group sessions within 5 recreation therapy group sessions Short term goals met: Complete Progress toward goals comments: Groups attended Which groups?: Goal setting, Stress management Reason goals not met: N/A Therapeutic equipment acquired: N/A Reason patient discharged from therapy: Discharge from hospital Pt/family agrees with progress & goals achieved: Yes Date patient discharged from therapy: 05/24/20   Almadelia Looman 05/24/2020, 12:04 PM

## 2020-05-24 NOTE — Progress Notes (Signed)
Patient has been pleasant and cooperative. Denies SI, HI, and AVH. Contracting for safety. Looking forward to discharge.

## 2020-05-24 NOTE — Patient Instructions (Addendum)
Your procedure is scheduled on: 05/26/20 Report to DAY SURGERY DEPARTMENT LOCATED ON 2ND FLOOR MEDICAL MALL ENTRANCE. To find out your arrival time please call [phone] between 1PM - 3PM on 05/25/20.  Remember: Instructions that are not followed completely may result in serious medical risk, up to and including death, or upon the discretion of your surgeon and anesthesiologist your surgery may need to be rescheduled.     _X__ 1. Do not eat food after midnight the night before your procedure.                 No gum chewing or hard candies. You may drink clear liquids up to 2 hours                 before you are scheduled to arrive for your surgery- DO not drink clear                 liquids within 2 hours of the start of your surgery.                 Clear Liquids include:  water, apple juice without pulp, clear carbohydrate                 drink such as Clearfast or Gatorade, Black Coffee or Tea (Do not add                 anything to coffee or tea). Diabetics water only  __X__2.  On the morning of surgery brush your teeth with toothpaste and water, you                 may rinse your mouth with mouthwash if you wish.  Do not swallow any              toothpaste of mouthwash.     _X__ 3.  No Alcohol for 24 hours before or after surgery.   _X__ 4.  Do Not Smoke or use e-cigarettes For 24 Hours Prior to Your Surgery.                 Do not use any chewable tobacco products for at least 6 hours prior to                 surgery.  ____  5.  Bring all medications with you on the day of surgery if instructed.   __X__  6.  Notify your doctor if there is any change in your medical condition      (cold, fever, infections).     Do not wear jewelry, make-up, hairpins, clips or nail polish. Do not wear lotions, powders, or perfumes.  Do not shave 48 hours prior to surgery. Men may shave face and neck. Do not bring valuables to the hospital.     [facility] is not responsible for any belongings or  valuables.  Contacts, dentures/partials or body piercings may not be worn into surgery. Bring a case for your contacts, glasses or hearing aids, a denture cup will be supplied. Leave your suitcase in the car. After surgery it may be brought to your room. For patients admitted to the hospital, discharge time is determined by your treatment team.   Patients discharged the day of surgery will not be allowed to drive home.   Please read over the following fact sheets that you were given:   MRSA Information  __X__ Take these medicines the morning of surgery with A SIP OF WATER:  1. bictegravir-emtricitabine-tenofovir AF (BIKTARVY) 50-200-25 MG TABS tablet  2. citalopram (CELEXA) 40 MG tablet  3. gabapentin (NEURONTIN) 300 MG capsule  4. atorvastatin (LIPITOR) 10 MG tablet  5. hydrOXYzine (ATARAX/VISTARIL) 10 MG tablet if needed  6.  ____ Fleet Enema (as directed)   __X__ Use CHG Soap/SAGE wipes as directed  __X__ Use inhalers on the day of surgery  ____ Stop metformin/Janumet/Farxiga 2 days prior to surgery    ____ Take 1/2 of usual insulin dose the night before surgery. No insulin the morning          of surgery.   ____ Stop Blood Thinners Coumadin/Plavix/Xarelto/Pleta/Pradaxa/Eliquis/Effient/Aspirin  on   Or contact your Surgeon, Cardiologist or Medical Doctor regarding  ability to stop your blood thinners  __X__ Stop Anti-inflammatories 7 days before surgery such as Advil, Ibuprofen, Motrin,  BC or Goodies Powder, Naprosyn, Naproxen, Aleve, Aspirin    __X__ Stop all herbal supplements, fish oil or vitamin E until after surgery.    ____ Bring C-Pap to the hospital.    FINISH THE ENSURE PRE SURGERY DRINK 2 HOURS BEFORE ARRIVING FOR SURGERY  REVIEW THE INCENTIVE SPIROMETER INSTRUCTIONS.  2 SHOWERS WITH THE CHG SOAP, SEE INSTRUCTIONS

## 2020-05-24 NOTE — Progress Notes (Signed)
Recreation Therapy Notes  Date: 05/24/2020  Time: 9:30 am  Location: Craft room  Behavioral response: Appropriate  Intervention Topic: Stress Management   Discussion/Intervention:  Group content on today was focused on stress. The group defined stress and way to cope with stress. Participants expressed how they know when they are stresses out. Individuals described the different ways they have to cope with stress. The group stated reasons why it is important to cope with stress. Patient explained what good stress is and some examples. The group participated in the intervention "Stress Management". Individuals were separated into two group and answered questions related to stress.  Clinical Observations/Feedback:  Patient came to group and was focused on what peers and staff had to say about stress. He stated that depression and peer pressure is what keeps him from managing his stress properly. Participant explained that he could improve his stress management by finding a hobby. Individual was social with peers and staff while participating on the intervention.  Irl Bodie LRT/CTRS         Alder Murri 05/24/2020 11:52 AM

## 2020-05-24 NOTE — Plan of Care (Signed)
  Problem: Education: Goal: Knowledge of [place] [facility] Education information/materials will improve Outcome: Progressing Goal: Emotional status will improve Outcome: Progressing Goal: Mental status will improve Outcome: Progressing Goal: Verbalization of understanding the information provided will improve Outcome: Progressing   Problem: Activity: Goal: Interest or engagement in activities will improve Outcome: Progressing Goal: Sleeping patterns will improve Outcome: Progressing   

## 2020-05-24 NOTE — BHH Suicide Risk Assessment (Signed)
[facility] Discharge Suicide Risk Assessment   Principal Problem: Adjustment disorder with mixed anxiety and depressed mood Discharge Diagnoses: Principal Problem:   Adjustment disorder with mixed anxiety and depressed mood Active Problems:   HIV disease (HCC)   HTN (hypertension)   Dyslipidemia   Cocaine dependence (HCC)   Substance induced mood disorder (HCC)   Bipolar disorder (manic depression) (HCC)   Arthritis pain, hip   Total Time spent with patient: 30 minutes  Musculoskeletal: Strength & Muscle Tone: within normal limits Gait & Station: normal Patient leans: N/A  Psychiatric Specialty Exam: Review of Systems  Constitutional: Negative.   HENT: Negative.   Eyes: Negative.   Respiratory: Negative.   Cardiovascular: Negative.   Gastrointestinal: Negative.   Musculoskeletal: Negative.   Skin: Negative.   Neurological: Negative.   Psychiatric/Behavioral: Negative.     Blood pressure (!) 146/98, pulse (!) 53, temperature 98.8 F (37.1 C), temperature source Oral, resp. rate 18, height 5\' 9"  (1.753 m), weight 88.5 kg, SpO2 99 %.Body mass index is 28.81 kg/m.  General Appearance: Casual  Eye Contact::  Good  Speech:  Clear and Coherent409  Volume:  Normal  Mood:  Euthymic  Affect:  Constricted  Thought Process:  Goal Directed  Orientation:  Full (Time, Place, and Person)  Thought Content:  Logical  Suicidal Thoughts:  No  Homicidal Thoughts:  No  Memory:  Immediate;   Fair Recent;   Fair Remote;   Fair  Judgement:  Fair  Insight:  Fair  Psychomotor Activity:  Normal  Concentration:  Fair  Recall:  [id number] of Knowledge:Fair  Language: Fair  Akathisia:  No  Handed:  Right  AIMS (if indicated):     Assets:  Desire for Improvement Resilience  Sleep:  Number of Hours: 7  Cognition: WNL  ADL's:  Intact   Mental Status Per Nursing Assessment::   On Admission:  Suicidal ideation indicated by patient  Demographic Factors:  Male and Living alone  Loss  Factors: Decline in physical health and Financial problems/change in socioeconomic status  Historical Factors: Impulsivity  Risk Reduction Factors:   Positive therapeutic relationship and Positive coping skills or problem solving skills  Continued Clinical Symptoms:  Alcohol/Substance Abuse/Dependencies  Cognitive Features That Contribute To Risk:  None    Suicide Risk:  Minimal: No identifiable suicidal ideation.  Patients presenting with no risk factors but with morbid ruminations; may be classified as minimal risk based on the severity of the depressive symptoms  Follow-up Information    [facility] Follow up on 06/02/2020.   Why: You are scheduled to speak with 06/04/2020, peer support specialist via phone on Thursday, June 17th at 7am. Thank You. Contact information: [address] [place] [phone]           Plan Of Care/Follow-up recommendations:  Activity:  Activity as tolerated Diet:  Regular diet Other:  Follow-up with [facility] and also follow-up with regular medical care including orthopedic surgery  [phone], MD 05/24/2020, 9:42 AM

## 2020-05-25 ENCOUNTER — Encounter
Admission: RE | Admit: 2020-05-25 | Discharge: 2020-05-25 | Disposition: A | Discharge status: Home / Self Care | Payer: Medicaid Other | Source: Ambulatory Visit | Attending: Orthopedic Surgery | Admitting: Orthopedic Surgery

## 2020-05-25 ENCOUNTER — Other Ambulatory Visit: Payer: Self-pay | Admitting: Orthopedic Surgery

## 2020-05-25 ENCOUNTER — Other Ambulatory Visit: Payer: Medicaid Other

## 2020-05-25 DIAGNOSIS — Z01818 Encounter for other preprocedural examination: Secondary | ICD-10-CM | POA: Insufficient documentation

## 2020-05-25 DIAGNOSIS — R001 Bradycardia, unspecified: Secondary | ICD-10-CM | POA: Insufficient documentation

## 2020-05-25 DIAGNOSIS — Z419 Encounter for procedure for purposes other than remedying health state, unspecified: Secondary | ICD-10-CM

## 2020-05-25 LAB — CBC WITH DIFFERENTIAL/PLATELET
Abs Immature Granulocytes: 0.02 10*3/uL (ref 0.00–0.07)
Basophils Absolute: 0 10*3/uL (ref 0.0–0.1)
Basophils Relative: 1 %
Eosinophils Absolute: 0.1 10*3/uL (ref 0.0–0.5)
Eosinophils Relative: 1 %
HCT: 39.7 % (ref 39.0–52.0)
Hemoglobin: 13.5 g/dL (ref 13.0–17.0)
Immature Granulocytes: 0 %
Lymphocytes Relative: 41 %
Lymphs Abs: 2.2 10*3/uL (ref 0.7–4.0)
MCH: 33.6 pg (ref 26.0–34.0)
MCHC: 34 g/dL (ref 30.0–36.0)
MCV: 98.8 fL (ref 80.0–100.0)
Monocytes Absolute: 0.5 10*3/uL (ref 0.1–1.0)
Monocytes Relative: 9 %
Neutro Abs: 2.7 10*3/uL (ref 1.7–7.7)
Neutrophils Relative %: 48 %
Platelets: 258 10*3/uL (ref 150–400)
RBC: 4.02 MIL/uL — ABNORMAL LOW (ref 4.22–5.81)
RDW: 12.2 % (ref 11.5–15.5)
WBC: 5.4 10*3/uL (ref 4.0–10.5)
nRBC: 0 % (ref 0.0–0.2)

## 2020-05-25 LAB — PROTIME-INR
INR: 1 (ref 0.8–1.2)
Prothrombin Time: 12.7 seconds (ref 11.4–15.2)

## 2020-05-25 LAB — URINALYSIS, ROUTINE W REFLEX MICROSCOPIC
Bilirubin Urine: NEGATIVE
Glucose, UA: NEGATIVE mg/dL
Hgb urine dipstick: NEGATIVE
Ketones, ur: NEGATIVE mg/dL
Leukocytes,Ua: NEGATIVE
Nitrite: NEGATIVE
Protein, ur: NEGATIVE mg/dL
Specific Gravity, Urine: 1.02 (ref 1.005–1.030)
pH: 7 (ref 5.0–8.0)

## 2020-05-25 LAB — SURGICAL PCR SCREEN
MRSA, PCR: NEGATIVE
Staphylococcus aureus: NEGATIVE

## 2020-05-25 LAB — COMPREHENSIVE METABOLIC PANEL
ALT: 26 U/L (ref 0–44)
AST: 23 U/L (ref 15–41)
Albumin: 3.6 g/dL (ref 3.5–5.0)
Alkaline Phosphatase: 77 U/L (ref 38–126)
Anion gap: 9 (ref 5–15)
BUN: 18 mg/dL (ref 6–20)
CO2: 30 mmol/L (ref 22–32)
Calcium: 8.7 mg/dL — ABNORMAL LOW (ref 8.9–10.3)
Chloride: 100 mmol/L (ref 98–111)
Creatinine, Ser: 1.12 mg/dL (ref 0.61–1.24)
GFR calc Af Amer: >60 mL/min (ref >60)
GFR calc non Af Amer: >60 mL/min (ref >60)
Glucose, Bld: 88 mg/dL (ref 70–99)
Potassium: 4.4 mmol/L (ref 3.5–5.1)
Sodium: 139 mmol/L (ref 135–145)
Total Bilirubin: 0.4 mg/dL (ref 0.3–1.2)
Total Protein: 7.5 g/dL (ref 6.5–8.1)

## 2020-05-25 LAB — TYPE AND SCREEN
ABO/RH(D): O POS
Antibody Screen: NEGATIVE
Extend sample reason: UNDETERMINED

## 2020-05-26 ENCOUNTER — Inpatient Hospital Stay: Payer: Medicaid Other | Admitting: Anesthesiology

## 2020-05-26 ENCOUNTER — Inpatient Hospital Stay: Payer: Medicaid Other

## 2020-05-26 ENCOUNTER — Encounter: Payer: Self-pay | Admitting: Orthopedic Surgery

## 2020-05-26 ENCOUNTER — Inpatient Hospital Stay
Admission: AD | Admit: 2020-05-26 | Discharge: 2020-06-02 | DRG: 467 | Disposition: A | Discharge status: Home / Self Care | Payer: Medicaid Other | Attending: Orthopedic Surgery | Admitting: Orthopedic Surgery

## 2020-05-26 ENCOUNTER — Other Ambulatory Visit: Payer: Self-pay

## 2020-05-26 ENCOUNTER — Inpatient Hospital Stay
Admission: RE | Admit: 2020-05-26 | Discharge: 2020-05-26 | Disposition: A | Discharge status: Home / Self Care | Payer: Medicaid Other | Source: Ambulatory Visit | Attending: Orthopedic Surgery | Admitting: Orthopedic Surgery

## 2020-05-26 ENCOUNTER — Encounter
Admission: AD | Disposition: A | Discharge status: Home / Self Care | Payer: Self-pay | Source: Home / Self Care | Attending: Orthopedic Surgery

## 2020-05-26 DIAGNOSIS — Z20822 Contact with and (suspected) exposure to covid-19: Secondary | ICD-10-CM | POA: Diagnosis present

## 2020-05-26 DIAGNOSIS — J45909 Unspecified asthma, uncomplicated: Secondary | ICD-10-CM | POA: Diagnosis present

## 2020-05-26 DIAGNOSIS — G8918 Other acute postprocedural pain: Secondary | ICD-10-CM

## 2020-05-26 DIAGNOSIS — M009 Pyogenic arthritis, unspecified: Secondary | ICD-10-CM | POA: Diagnosis not present

## 2020-05-26 DIAGNOSIS — Z791 Long term (current) use of non-steroidal anti-inflammatories (NSAID): Secondary | ICD-10-CM | POA: Diagnosis not present

## 2020-05-26 DIAGNOSIS — I440 Atrioventricular block, first degree: Secondary | ICD-10-CM | POA: Diagnosis present

## 2020-05-26 DIAGNOSIS — F17210 Nicotine dependence, cigarettes, uncomplicated: Secondary | ICD-10-CM | POA: Diagnosis present

## 2020-05-26 DIAGNOSIS — F319 Bipolar disorder, unspecified: Secondary | ICD-10-CM | POA: Diagnosis present

## 2020-05-26 DIAGNOSIS — R338 Other retention of urine: Secondary | ICD-10-CM | POA: Diagnosis not present

## 2020-05-26 DIAGNOSIS — N401 Enlarged prostate with lower urinary tract symptoms: Secondary | ICD-10-CM | POA: Diagnosis present

## 2020-05-26 DIAGNOSIS — T8451XA Infection and inflammatory reaction due to internal right hip prosthesis, initial encounter: Principal | ICD-10-CM | POA: Diagnosis present

## 2020-05-26 DIAGNOSIS — I10 Essential (primary) hypertension: Secondary | ICD-10-CM | POA: Diagnosis present

## 2020-05-26 DIAGNOSIS — Y792 Prosthetic and other implants, materials and accessory orthopedic devices associated with adverse incidents: Secondary | ICD-10-CM | POA: Diagnosis present

## 2020-05-26 DIAGNOSIS — Z590 Homelessness: Secondary | ICD-10-CM

## 2020-05-26 DIAGNOSIS — T8450XA Infection and inflammatory reaction due to unspecified internal joint prosthesis, initial encounter: Secondary | ICD-10-CM | POA: Diagnosis not present

## 2020-05-26 DIAGNOSIS — L02415 Cutaneous abscess of right lower limb: Secondary | ICD-10-CM | POA: Diagnosis present

## 2020-05-26 DIAGNOSIS — K219 Gastro-esophageal reflux disease without esophagitis: Secondary | ICD-10-CM | POA: Diagnosis present

## 2020-05-26 DIAGNOSIS — Z79899 Other long term (current) drug therapy: Secondary | ICD-10-CM

## 2020-05-26 DIAGNOSIS — B20 Human immunodeficiency virus [HIV] disease: Secondary | ICD-10-CM | POA: Diagnosis present

## 2020-05-26 DIAGNOSIS — Z8619 Personal history of other infectious and parasitic diseases: Secondary | ICD-10-CM | POA: Diagnosis not present

## 2020-05-26 DIAGNOSIS — F1490 Cocaine use, unspecified, uncomplicated: Secondary | ICD-10-CM | POA: Diagnosis present

## 2020-05-26 DIAGNOSIS — Z888 Allergy status to other drugs, medicaments and biological substances status: Secondary | ICD-10-CM | POA: Diagnosis not present

## 2020-05-26 DIAGNOSIS — M7061 Trochanteric bursitis, right hip: Secondary | ICD-10-CM | POA: Diagnosis present

## 2020-05-26 LAB — CREATININE, SERUM
Creatinine, Ser: 1.1 mg/dL (ref 0.61–1.24)
GFR calc Af Amer: >60 mL/min (ref >60)
GFR calc non Af Amer: >60 mL/min (ref >60)

## 2020-05-26 LAB — URINE DRUG SCREEN, QUALITATIVE (ARMC ONLY)
Amphetamines, Ur Screen: NOT DETECTED
Barbiturates, Ur Screen: NOT DETECTED
Benzodiazepine, Ur Scrn: NOT DETECTED
Cannabinoid 50 Ng, Ur ~~LOC~~: NOT DETECTED
Cocaine Metabolite,Ur ~~LOC~~: NOT DETECTED
MDMA (Ecstasy)Ur Screen: NOT DETECTED
Methadone Scn, Ur: NOT DETECTED
Opiate, Ur Screen: NOT DETECTED
Phencyclidine (PCP) Ur S: NOT DETECTED
Tricyclic, Ur Screen: NOT DETECTED

## 2020-05-26 LAB — CBC
HCT: 38 % — ABNORMAL LOW (ref 39.0–52.0)
Hemoglobin: 12.6 g/dL — ABNORMAL LOW (ref 13.0–17.0)
MCH: 33 pg (ref 26.0–34.0)
MCHC: 33.2 g/dL (ref 30.0–36.0)
MCV: 99.5 fL (ref 80.0–100.0)
Platelets: 237 10*3/uL (ref 150–400)
RBC: 3.82 MIL/uL — ABNORMAL LOW (ref 4.22–5.81)
RDW: 12.3 % (ref 11.5–15.5)
WBC: 13.4 10*3/uL — ABNORMAL HIGH (ref 4.0–10.5)
nRBC: 0 % (ref 0.0–0.2)

## 2020-05-26 LAB — SARS CORONAVIRUS 2 BY RT PCR (HOSPITAL ORDER, PERFORMED IN ~~LOC~~ HOSPITAL LAB): SARS Coronavirus 2: NEGATIVE

## 2020-05-26 SURGERY — IRRIGATION AND DEBRIDEMENT POSTERIOR HIP
Anesthesia: General | Site: Hip | Laterality: Right

## 2020-05-26 MED ORDER — METOCLOPRAMIDE HCL 10 MG PO TABS: 5.0000 mg | ORAL_TABLET | Freq: Three times a day (TID) | ORAL | Status: DC | PRN

## 2020-05-26 MED ORDER — METOCLOPRAMIDE HCL 5 MG/ML IJ SOLN: 5.0000 mg | Freq: Three times a day (TID) | INTRAMUSCULAR | Status: DC | PRN

## 2020-05-26 MED ORDER — ONDANSETRON HCL 4 MG PO TABS: 4.0000 mg | ORAL_TABLET | Freq: Four times a day (QID) | ORAL | Status: DC | PRN

## 2020-05-26 MED ORDER — PHENOL 1.4 % MT LIQD
1.0000 | OROMUCOSAL | Status: DC | PRN
  Filled 2020-05-26: qty 177

## 2020-05-26 MED ORDER — MAGNESIUM CITRATE PO SOLN
1.0000 | Freq: Once | ORAL | Status: DC | PRN
  Filled 2020-05-26: qty 296

## 2020-05-26 MED ORDER — SODIUM CHLORIDE 0.9 % IV SOLN
2.0000 g | INTRAVENOUS | Status: DC
  Administered 2020-05-27 – 2020-06-02 (×8): 2 g via INTRAVENOUS
  Filled 2020-05-26 (×3): qty 20
  Filled 2020-05-26: qty 2
  Filled 2020-05-26: qty 20
  Filled 2020-05-26 (×2): qty 2
  Filled 2020-05-26: qty 20
  Filled 2020-05-26 (×2): qty 2

## 2020-05-26 MED ORDER — MAGNESIUM HYDROXIDE 400 MG/5ML PO SUSP
30.0000 mL | Freq: Every day | ORAL | Status: DC | PRN
  Administered 2020-05-27: 30 mL via ORAL
  Filled 2020-05-26: qty 30

## 2020-05-26 MED ORDER — FAMOTIDINE 20 MG PO TABS
ORAL_TABLET | ORAL | Status: AC
  Administered 2020-05-26: 20 mg via ORAL
  Filled 2020-05-26: qty 1

## 2020-05-26 MED ORDER — PROPOFOL 10 MG/ML IV BOLUS
INTRAVENOUS | Status: DC | PRN
  Administered 2020-05-26: 150 mg via INTRAVENOUS
  Administered 2020-05-26: 50 mg via INTRAVENOUS

## 2020-05-26 MED ORDER — ONDANSETRON HCL 4 MG/2ML IJ SOLN: 4.0000 mg | Freq: Once | INTRAMUSCULAR | Status: DC | PRN

## 2020-05-26 MED ORDER — VANCOMYCIN HCL 2000 MG/400ML IV SOLN
2000.0000 mg | Freq: Once | INTRAVENOUS | Status: AC
  Administered 2020-05-26: 2000 mg via INTRAVENOUS
  Filled 2020-05-26: qty 400

## 2020-05-26 MED ORDER — LACTATED RINGERS IV SOLN: INTRAVENOUS | Status: DC

## 2020-05-26 MED ORDER — CITALOPRAM HYDROBROMIDE 20 MG PO TABS
40.0000 mg | ORAL_TABLET | Freq: Every day | ORAL | Status: DC
  Administered 2020-05-26 – 2020-06-02 (×8): 40 mg via ORAL
  Filled 2020-05-26 (×8): qty 2

## 2020-05-26 MED ORDER — ACETAMINOPHEN 10 MG/ML IV SOLN
INTRAVENOUS | Status: AC
  Filled 2020-05-26: qty 100

## 2020-05-26 MED ORDER — BISACODYL 10 MG RE SUPP: 10.0000 mg | Freq: Every day | RECTAL | Status: DC | PRN

## 2020-05-26 MED ORDER — IPRATROPIUM-ALBUTEROL 0.5-2.5 (3) MG/3ML IN SOLN
RESPIRATORY_TRACT | Status: AC
  Administered 2020-05-26: 3 mL via RESPIRATORY_TRACT
  Filled 2020-05-26: qty 3

## 2020-05-26 MED ORDER — DOCUSATE SODIUM 100 MG PO CAPS
100.0000 mg | ORAL_CAPSULE | Freq: Two times a day (BID) | ORAL | Status: DC
  Administered 2020-05-26 – 2020-06-02 (×13): 100 mg via ORAL
  Filled 2020-05-26 (×14): qty 1

## 2020-05-26 MED ORDER — CEFAZOLIN SODIUM-DEXTROSE 2-4 GM/100ML-% IV SOLN
INTRAVENOUS | Status: AC
  Filled 2020-05-26: qty 100

## 2020-05-26 MED ORDER — CHLORHEXIDINE GLUCONATE 0.12 % MT SOLN
OROMUCOSAL | Status: AC
  Administered 2020-05-26: 15 mL via OROMUCOSAL
  Filled 2020-05-26: qty 15

## 2020-05-26 MED ORDER — FENTANYL CITRATE (PF) 100 MCG/2ML IJ SOLN
INTRAMUSCULAR | Status: DC | PRN
  Administered 2020-05-26 (×2): 50 ug via INTRAVENOUS
  Administered 2020-05-26 (×2): 25 ug via INTRAVENOUS
  Administered 2020-05-26: 50 ug via INTRAVENOUS

## 2020-05-26 MED ORDER — VANCOMYCIN HCL 1000 MG IV SOLR
INTRAVENOUS | Status: AC
  Filled 2020-05-26: qty 1000

## 2020-05-26 MED ORDER — ENOXAPARIN SODIUM 40 MG/0.4ML ~~LOC~~ SOLN
40.0000 mg | SUBCUTANEOUS | Status: DC
  Administered 2020-05-27 – 2020-06-02 (×7): 40 mg via SUBCUTANEOUS
  Filled 2020-05-26 (×7): qty 0.4

## 2020-05-26 MED ORDER — ZOLPIDEM TARTRATE 5 MG PO TABS
5.0000 mg | ORAL_TABLET | Freq: Every evening | ORAL | Status: DC | PRN
  Administered 2020-05-28: 5 mg via ORAL
  Filled 2020-05-26: qty 1

## 2020-05-26 MED ORDER — MENTHOL 3 MG MT LOZG
1.0000 | LOZENGE | OROMUCOSAL | Status: DC | PRN
  Filled 2020-05-26: qty 9

## 2020-05-26 MED ORDER — HYDROMORPHONE HCL 1 MG/ML IJ SOLN: 0.5000 mg | INTRAMUSCULAR | Status: DC | PRN

## 2020-05-26 MED ORDER — POVIDONE-IODINE 10 % EX SOLN
CUTANEOUS | Status: DC | PRN
  Administered 2020-05-26: 1 via TOPICAL

## 2020-05-26 MED ORDER — TRAZODONE HCL 100 MG PO TABS
300.0000 mg | ORAL_TABLET | Freq: Every evening | ORAL | Status: DC | PRN
  Administered 2020-05-27: 300 mg via ORAL
  Filled 2020-05-26: qty 3

## 2020-05-26 MED ORDER — METHOCARBAMOL 1000 MG/10ML IJ SOLN
500.0000 mg | Freq: Four times a day (QID) | INTRAVENOUS | Status: DC | PRN
  Filled 2020-05-26: qty 5

## 2020-05-26 MED ORDER — ALBUTEROL SULFATE (2.5 MG/3ML) 0.083% IN NEBU: 2.5000 mg | INHALATION_SOLUTION | RESPIRATORY_TRACT | Status: DC | PRN

## 2020-05-26 MED ORDER — MIDAZOLAM HCL 2 MG/2ML IJ SOLN
INTRAMUSCULAR | Status: DC | PRN
  Administered 2020-05-26: 2 mg via INTRAVENOUS

## 2020-05-26 MED ORDER — FENTANYL CITRATE (PF) 100 MCG/2ML IJ SOLN
INTRAMUSCULAR | Status: AC
  Filled 2020-05-26: qty 2

## 2020-05-26 MED ORDER — HYDROCHLOROTHIAZIDE 12.5 MG PO CAPS
12.5000 mg | ORAL_CAPSULE | Freq: Every day | ORAL | Status: DC
  Administered 2020-05-26 – 2020-06-01 (×7): 12.5 mg via ORAL
  Filled 2020-05-26 (×7): qty 1

## 2020-05-26 MED ORDER — SODIUM CHLORIDE 0.9 % IV SOLN: INTRAVENOUS | Status: DC

## 2020-05-26 MED ORDER — CHLORHEXIDINE GLUCONATE 0.12 % MT SOLN: 15.0000 mL | Freq: Once | OROMUCOSAL | Status: AC

## 2020-05-26 MED ORDER — SODIUM CHLORIDE 0.9 % IV SOLN
INTRAVENOUS | Status: DC | PRN
  Administered 2020-05-26: 40 ug/min via INTRAVENOUS

## 2020-05-26 MED ORDER — ATORVASTATIN CALCIUM 10 MG PO TABS
10.0000 mg | ORAL_TABLET | Freq: Every day | ORAL | Status: DC
  Administered 2020-05-26 – 2020-06-02 (×8): 10 mg via ORAL
  Filled 2020-05-26 (×8): qty 1

## 2020-05-26 MED ORDER — DEXAMETHASONE SODIUM PHOSPHATE 10 MG/ML IJ SOLN
INTRAMUSCULAR | Status: AC
  Filled 2020-05-26: qty 1

## 2020-05-26 MED ORDER — GLYCOPYRROLATE 0.2 MG/ML IJ SOLN
INTRAMUSCULAR | Status: DC | PRN
  Administered 2020-05-26 (×2): .2 mg via INTRAVENOUS

## 2020-05-26 MED ORDER — BICTEGRAVIR-EMTRICITAB-TENOFOV 50-200-25 MG PO TABS
1.0000 | ORAL_TABLET | Freq: Every day | ORAL | Status: DC
  Administered 2020-05-26 – 2020-06-02 (×8): 1 via ORAL
  Filled 2020-05-26 (×9): qty 1

## 2020-05-26 MED ORDER — OXYCODONE HCL 5 MG PO TABS
5.0000 mg | ORAL_TABLET | ORAL | Status: DC | PRN
  Administered 2020-05-29 – 2020-05-30 (×3): 5 mg via ORAL
  Administered 2020-05-30: 10 mg via ORAL
  Administered 2020-06-01: 5 mg via ORAL
  Filled 2020-05-26: qty 2
  Filled 2020-05-26 (×2): qty 1
  Filled 2020-05-26: qty 2

## 2020-05-26 MED ORDER — LIDOCAINE HCL (CARDIAC) PF 100 MG/5ML IV SOSY
PREFILLED_SYRINGE | INTRAVENOUS | Status: DC | PRN
  Administered 2020-05-26: 100 mg via INTRAVENOUS

## 2020-05-26 MED ORDER — CEFAZOLIN SODIUM-DEXTROSE 2-4 GM/100ML-% IV SOLN
2.0000 g | INTRAVENOUS | Status: AC
  Administered 2020-05-26: 2 g via INTRAVENOUS

## 2020-05-26 MED ORDER — METHOCARBAMOL 500 MG PO TABS
500.0000 mg | ORAL_TABLET | Freq: Four times a day (QID) | ORAL | Status: DC | PRN
  Administered 2020-05-27 – 2020-06-02 (×2): 500 mg via ORAL
  Filled 2020-05-26 (×2): qty 1

## 2020-05-26 MED ORDER — ONDANSETRON HCL 4 MG/2ML IJ SOLN
INTRAMUSCULAR | Status: DC | PRN
  Administered 2020-05-26: 4 mg via INTRAVENOUS

## 2020-05-26 MED ORDER — FENTANYL CITRATE (PF) 100 MCG/2ML IJ SOLN: 25.0000 ug | INTRAMUSCULAR | Status: DC | PRN

## 2020-05-26 MED ORDER — MIDAZOLAM HCL 2 MG/2ML IJ SOLN
INTRAMUSCULAR | Status: AC
  Filled 2020-05-26: qty 2

## 2020-05-26 MED ORDER — VANCOMYCIN HCL IN DEXTROSE 1-5 GM/200ML-% IV SOLN
1000.0000 mg | Freq: Two times a day (BID) | INTRAVENOUS | Status: DC
  Filled 2020-05-26 (×2): qty 200

## 2020-05-26 MED ORDER — ACETAMINOPHEN 10 MG/ML IV SOLN
INTRAVENOUS | Status: DC | PRN
  Administered 2020-05-26: 1000 mg via INTRAVENOUS

## 2020-05-26 MED ORDER — EPHEDRINE SULFATE 50 MG/ML IJ SOLN
INTRAMUSCULAR | Status: DC | PRN
  Administered 2020-05-26 (×6): 10 mg via INTRAVENOUS

## 2020-05-26 MED ORDER — FAMOTIDINE 20 MG PO TABS: 20.0000 mg | ORAL_TABLET | Freq: Once | ORAL | Status: AC

## 2020-05-26 MED ORDER — PHENYLEPHRINE HCL (PRESSORS) 10 MG/ML IV SOLN
INTRAVENOUS | Status: DC | PRN
  Administered 2020-05-26 (×2): 100 ug via INTRAVENOUS
  Administered 2020-05-26: 50 ug via INTRAVENOUS
  Administered 2020-05-26: 100 ug via INTRAVENOUS

## 2020-05-26 MED ORDER — DEXAMETHASONE SODIUM PHOSPHATE 10 MG/ML IJ SOLN
INTRAMUSCULAR | Status: DC | PRN
  Administered 2020-05-26: 10 mg via INTRAVENOUS

## 2020-05-26 MED ORDER — GABAPENTIN 300 MG PO CAPS
300.0000 mg | ORAL_CAPSULE | Freq: Two times a day (BID) | ORAL | Status: DC
  Administered 2020-05-26 – 2020-06-02 (×14): 300 mg via ORAL
  Filled 2020-05-26 (×15): qty 1

## 2020-05-26 MED ORDER — HYDROXYZINE HCL 10 MG PO TABS
10.0000 mg | ORAL_TABLET | Freq: Three times a day (TID) | ORAL | Status: DC | PRN
  Filled 2020-05-26: qty 1

## 2020-05-26 MED ORDER — IPRATROPIUM-ALBUTEROL 0.5-2.5 (3) MG/3ML IN SOLN: 3.0000 mL | Freq: Once | RESPIRATORY_TRACT | Status: AC

## 2020-05-26 MED ORDER — VANCOMYCIN HCL IN DEXTROSE 1-5 GM/200ML-% IV SOLN
1000.0000 mg | Freq: Two times a day (BID) | INTRAVENOUS | Status: DC
  Administered 2020-05-27 – 2020-05-30 (×7): 1000 mg via INTRAVENOUS
  Filled 2020-05-26 (×9): qty 200

## 2020-05-26 MED ORDER — OXYCODONE HCL 5 MG PO TABS
10.0000 mg | ORAL_TABLET | ORAL | Status: DC | PRN
  Administered 2020-05-31: 10 mg via ORAL
  Administered 2020-06-02: 15 mg via ORAL
  Filled 2020-05-26 (×2): qty 3
  Filled 2020-05-26: qty 2

## 2020-05-26 MED ORDER — VANCOMYCIN HCL 1000 MG IV SOLR
INTRAVENOUS | Status: DC | PRN
  Administered 2020-05-26: 1000 mg

## 2020-05-26 MED ORDER — NEOMYCIN-POLYMYXIN B GU 40-200000 IR SOLN
Status: AC
  Filled 2020-05-26: qty 20

## 2020-05-26 MED ORDER — OXYCODONE HCL 5 MG PO TABS: 5.0000 mg | ORAL_TABLET | Freq: Once | ORAL | Status: DC | PRN

## 2020-05-26 MED ORDER — ONDANSETRON HCL 4 MG/2ML IJ SOLN: 4.0000 mg | Freq: Four times a day (QID) | INTRAMUSCULAR | Status: DC | PRN

## 2020-05-26 MED ORDER — NEOMYCIN-POLYMYXIN B GU 40-200000 IR SOLN
Status: DC | PRN
  Administered 2020-05-26: 16 mL

## 2020-05-26 MED ORDER — EPHEDRINE 5 MG/ML INJ
INTRAVENOUS | Status: AC
  Filled 2020-05-26: qty 10

## 2020-05-26 MED ORDER — ACETAMINOPHEN 325 MG PO TABS: 325.0000 mg | ORAL_TABLET | Freq: Four times a day (QID) | ORAL | Status: DC | PRN

## 2020-05-26 MED ORDER — OXYCODONE HCL 5 MG/5ML PO SOLN: 5.0000 mg | Freq: Once | ORAL | Status: DC | PRN

## 2020-05-26 MED ORDER — DEXMEDETOMIDINE HCL 200 MCG/2ML IV SOLN
INTRAVENOUS | Status: DC | PRN
  Administered 2020-05-26: 8 ug via INTRAVENOUS

## 2020-05-26 MED ORDER — ORAL CARE MOUTH RINSE: 15.0000 mL | Freq: Once | OROMUCOSAL | Status: AC

## 2020-05-26 MED ORDER — TRAMADOL HCL 50 MG PO TABS
50.0000 mg | ORAL_TABLET | Freq: Four times a day (QID) | ORAL | Status: DC
  Administered 2020-05-26 – 2020-06-02 (×24): 50 mg via ORAL
  Filled 2020-05-26 (×25): qty 1

## 2020-05-26 SURGICAL SUPPLY — 61 items
BLADE SAGITTAL AGGR TOOTH XLG (BLADE) IMPLANT
BLADE SAW 90X13X1.19 OSCILLAT (BLADE) ×6 IMPLANT
BNDG COHESIVE 6X5 TAN STRL LF (GAUZE/BANDAGES/DRESSINGS) ×9 IMPLANT
CANISTER SUCT 1200ML W/VALVE (MISCELLANEOUS) ×3 IMPLANT
CANISTER WOUND CARE 500ML ATS (WOUND CARE) ×3 IMPLANT
CHLORAPREP W/TINT 26 (MISCELLANEOUS) ×3 IMPLANT
COVER BACK TABLE REUSABLE LG (DRAPES) ×3 IMPLANT
COVER WAND RF STERILE (DRAPES) ×3 IMPLANT
CUP ACETAB VERSA DBL 28X58 DMI (Orthopedic Implant) ×3 IMPLANT
DRAPE 3/4 80X56 (DRAPES) ×9 IMPLANT
DRAPE C-ARM XRAY 36X54 (DRAPES) IMPLANT
DRAPE INCISE IOBAN 66X60 STRL (DRAPES) IMPLANT
DRAPE POUCH INSTRU U-SHP 10X18 (DRAPES) ×3 IMPLANT
DRESSING SURGICEL FIBRLLR 1X2 (HEMOSTASIS) IMPLANT
DRSG OPSITE POSTOP 4X8 (GAUZE/BANDAGES/DRESSINGS) IMPLANT
DRSG SURGICEL FIBRILLAR 1X2 (HEMOSTASIS)
ELECT BLADE 6.5 EXT (BLADE) ×3 IMPLANT
ELECT REM PT RETURN 9FT ADLT (ELECTROSURGICAL) ×3
ELECTRODE REM PT RTRN 9FT ADLT (ELECTROSURGICAL) ×1 IMPLANT
GLOVE BIOGEL PI IND STRL 9 (GLOVE) ×1 IMPLANT
GLOVE BIOGEL PI INDICATOR 9 (GLOVE) ×2
GLOVE SURG SYN 9.0  PF PI (GLOVE) ×4
GLOVE SURG SYN 9.0 PF PI (GLOVE) ×2 IMPLANT
GOWN SRG 2XL LVL 4 RGLN SLV (GOWNS) ×1 IMPLANT
GOWN STRL NON-REIN 2XL LVL4 (GOWNS) ×2
GOWN STRL REUS W/ TWL LRG LVL3 (GOWN DISPOSABLE) ×1 IMPLANT
GOWN STRL REUS W/TWL LRG LVL3 (GOWN DISPOSABLE) ×2
HANDPIECE VERSAJET DEBRIDEMENT (MISCELLANEOUS) ×3 IMPLANT
HEMOVAC 400CC 10FR (MISCELLANEOUS) IMPLANT
HIP FEM HD M 28 (Head) ×3 IMPLANT
HOLDER FOLEY CATH W/STRAP (MISCELLANEOUS) IMPLANT
HOOD PEEL AWAY FLYTE STAYCOOL (MISCELLANEOUS) ×6 IMPLANT
KIT PREVENA INCISION MGT 13 (CANNISTER) ×3 IMPLANT
KIT STIMULAN RAPID CURE 5CC (Orthopedic Implant) ×3 IMPLANT
MAT ABSORB  FLUID 56X50 GRAY (MISCELLANEOUS) ×2
MAT ABSORB FLUID 56X50 GRAY (MISCELLANEOUS) ×1 IMPLANT
NDL SAFETY ECLIPSE 18X1.5 (NEEDLE) ×1 IMPLANT
NEEDLE HYPO 18GX1.5 SHARP (NEEDLE) ×2
NEEDLE SPNL 20GX3.5 QUINCKE YW (NEEDLE) IMPLANT
NS IRRIG 1000ML POUR BTL (IV SOLUTION) ×3 IMPLANT
PACK HIP COMPR (MISCELLANEOUS) ×3 IMPLANT
SCALPEL PROTECTED #10 DISP (BLADE) ×6 IMPLANT
SOL PREP PVP 2OZ (MISCELLANEOUS) ×6
SOLUTION PREP PVP 2OZ (MISCELLANEOUS) ×2 IMPLANT
SPONGE DRAIN TRACH 4X4 STRL 2S (GAUZE/BANDAGES/DRESSINGS) IMPLANT
STAPLER SKIN PROX 35W (STAPLE) ×3 IMPLANT
STRAP SAFETY 5IN WIDE (MISCELLANEOUS) ×3 IMPLANT
SUT DVC 2 QUILL PDO  T11 36X36 (SUTURE) ×2
SUT DVC 2 QUILL PDO T11 36X36 (SUTURE) ×1 IMPLANT
SUT SILK 0 (SUTURE) ×2
SUT SILK 0 30XBRD TIE 6 (SUTURE) ×1 IMPLANT
SUT V-LOC 90 ABS DVC 3-0 CL (SUTURE) ×3 IMPLANT
SUT VIC AB 1 CT1 36 (SUTURE) ×3 IMPLANT
SYR 20ML LL LF (SYRINGE) ×3 IMPLANT
SYR 30ML LL (SYRINGE) ×3 IMPLANT
SYR 50ML LL SCALE MARK (SYRINGE) IMPLANT
SYR BULB IRRIG 60ML STRL (SYRINGE) ×3 IMPLANT
TAPE MICROFOAM 4IN (TAPE) IMPLANT
TIP BRUSH PULSAVAC PLUS 24.33 (MISCELLANEOUS) ×3 IMPLANT
TOWEL OR 17X26 4PK STRL BLUE (TOWEL DISPOSABLE) IMPLANT
TRAY FOLEY MTR SLVR 16FR STAT (SET/KITS/TRAYS/PACK) IMPLANT

## 2020-05-26 NOTE — Anesthesia Preprocedure Evaluation (Addendum)
Anesthesia Evaluation  Patient identified by MRN, date of birth, ID band Patient awake    Reviewed: Allergy & Precautions, H&P , NPO status , [name] Chart, lab work & pertinent test results  History of Anesthesia Complications (+) PONV  Airway Mallampati: II  TM Distance: >3 FB     Dental  (+) Edentulous Upper, Edentulous Lower   Pulmonary asthma , Smoking history: denies smoking.,  Chronic bronchitis No home O2    + decreased breath sounds      Cardiovascular hypertension, + dysrhythmias (asymptomatic bradycardia)  Rhythm:regular Rate:Bradycardia     Neuro/Psych PSYCHIATRIC DISORDERS Depression Bipolar Disorder negative neurological ROS     GI/Hepatic GERD  ,(+) Hepatitis -, C  Endo/Other  negative endocrine ROS  Renal/GU      Musculoskeletal   Abdominal   Peds  Hematology negative hematology ROS (+)   Anesthesia Other Findings Past Medical History: No date: AIDS (acquired immune deficiency syndrome) (HCC) No date: Arthritis No date: Asthma No date: Bipolar disorder (HCC) No date: Bronchitis No date: Complication of anesthesia No date: Depression No date: Dysrhythmia     Comment:  1st degree heart block/ brady No date: GERD (gastroesophageal reflux disease) No date: Hepatitis C     Comment:  treated No date: HIV (human immunodeficiency virus infection) (HCC) No date: HTN (hypertension) No date: PONV (postoperative nausea and vomiting)  Past Surgical History: 09/01/2019: APPLICATION OF WOUND VAC; Right     Comment:  Procedure: APPLICATION OF WOUND VAC;  Surgeon: Kennedy Bucker, MD;  Location: [facility] ORS;  Service: Orthopedics;               Laterality: Right;  Serial # [id number] No date: HERNIA REPAIR; Left     Comment:  inguinal No date: TOE SURGERY No date: TOE SURGERY; Right 09/01/2019: TOTAL HIP ARTHROPLASTY; Right     Comment:  Procedure: TOTAL HIP ARTHROPLASTY ANTERIOR APPROACH;                 Surgeon: Kennedy Bucker, MD;  Location: [facility] ORS;                Service: Orthopedics;  Laterality: Right;  BMI    Body Mass Index: 27.62 kg/m      Reproductive/Obstetrics negative OB ROS                            Anesthesia Physical Anesthesia Plan  ASA: III  Anesthesia Plan: General LMA   Post-op Pain Management:    Induction:   PONV Risk Score and Plan: Dexamethasone, Ondansetron, Midazolam and Treatment may vary due to age or medical condition  Airway Management Planned:   Additional Equipment:   Intra-op Plan:   Post-operative Plan:   Informed Consent: I have reviewed the patients History and Physical, chart, labs and discussed the procedure including the risks, benefits and alternatives for the proposed anesthesia with the patient or authorized representative who has indicated his/her understanding and acceptance.     Dental Advisory Given  Plan Discussed with: Anesthesiologist, CRNA and Surgeon  Anesthesia Plan Comments:         Anesthesia Quick Evaluation

## 2020-05-26 NOTE — Anesthesia Procedure Notes (Signed)
Procedure Name: LMA Insertion Date/Time: 05/26/2020 1:49 PM Performed by: Rosanne Gutting, CRNA Pre-anesthesia Checklist: Patient identified, Patient being monitored, Timeout performed, Emergency Drugs available and Suction available Patient Re-evaluated:Patient Re-evaluated prior to induction Oxygen Delivery Method: Circle system utilized Preoxygenation: Pre-oxygenation with 100% oxygen Induction Type: IV induction Ventilation: Mask ventilation without difficulty LMA: LMA inserted LMA Size: 4.5 Tube type: Oral Number of attempts: 1 Placement Confirmation: positive ETCO2 and breath sounds checked- equal and bilateral Tube secured with: Tape Dental Injury: Teeth and Oropharynx as per pre-operative assessment

## 2020-05-26 NOTE — Consult Note (Signed)
Pharmacy Antibiotic Note  Roy Rodino Sr. is Roy 54 y.o. male admitted on 05/26/2020 with septic total hip.  Pharmacy has been consulted for Vancomycin dosing. Patient to receive Vancomycin 2g IV loading dose initially.  Plan: Vancomycin 1g IV Q12 hours per [place] [facility]  Height: 5\' 9"  (175.3 cm) Weight: 84.8 kg (187 lb) IBW/kg (Calculated) : 70.7  Temp (24hrs), Avg:97.6 F (36.4 C), Min:96.9 F (36.1 C), Max:98.6 F (37 C)  Recent Labs  Lab 05/25/20 1212  WBC 5.4  CREATININE 1.12    Estimated Creatinine Clearance: 74.5 mL/min (by C-G formula based on SCr of 1.12 mg/dL).    Allergies  Allergen Reactions  . Amlodipine Swelling    Of the tongue  . Lisinopril Swelling  . Lactose Other (See Comments)    GI distress  . Pollen Extract Other (See Comments)    Itchy eyes and runny nose    Antimicrobials this admission: Vancomycin 6/10 >>  Cefazolin 6/10 x1  Microbiology results: 6/10 Wound Cx: pending   Thank you for allowing pharmacy to be Roy part of this patient's care.  8/10 Roy [name] 05/26/2020 6:45 PM

## 2020-05-26 NOTE — Transfer of Care (Signed)
Immediate Anesthesia Transfer of Care Note  Patient: Roy Cory Belote Sr.  Procedure(s) Performed: RIGHT HIP IRRIGATION AND DEBRIDEMENT, WITH EXCHANGE OF FEMORAL HEAD COMPONENT AND ANTIBIOTIC BEADS, VERSAJET (Right Hip)  Patient Location: PACU  Anesthesia Type:General  Level of Consciousness: awake, alert  and oriented  Airway & Oxygen Therapy: Patient Spontanous Breathing and Patient connected to face mask oxygen  Post-op Assessment: Report given to RN and Post -op Vital signs reviewed and stable  Post vital signs: stable  Last Vitals:  Vitals Value Taken Time  BP 128/95 05/26/20 1617  Temp    Pulse 113 05/26/20 1626  Resp 22 05/26/20 1626  SpO2 98 % 05/26/20 1626  Vitals shown include unvalidated device data.  Last Pain:  Vitals:   05/26/20 1216  TempSrc: Tympanic  PainSc: 0-No pain         Complications: Pt reported chest tightness and difficulty breathing. Breathing treatment administered and pt reports his breathing feels much better. Will continue to monitor. VSS throughout.

## 2020-05-26 NOTE — Consult Note (Signed)
NAME: Roy Pentico Sr.  DOB: [birth year]  MRN: [id number]  Date/Time: 05/26/2020 6:23 PM  REQUESTING PROVIDER: Rosita Kea Subjective:  REASON FOR CONSULT: THA rt -septic arthritis ? Roy Liter Besecker Sr. is a 54 y.o. male with a history of HIV, Bipolar disorder, treated hepC, is admitted for rt hip surgery, cocaine use but denies IVDA. Pt underwent RT hip replacement on 09/01/19. He presented to ED on 4/15 with rt hip and leg pain after mulching his yard. Xray did not show any abnormality. He was discharged home on oxycodone. As he continued to have pain he was assessed by ortho on 04/20/20 and sent to [facility] for rt trochanteric bursa steroid injection. During the ultrasound exam Dr. Kirtland Bouchard noted a 4X4 cm loculated mixed echogenic lesion anterior to the femur and he wanted to aspirate it.Pt did not undergo aspiration that day as he had transportation issues. He had the procedure on 05/09/20- about 10cc of purulent fluid was aspirated and sent for culture. The culture was negative, but cell count showed > 8000 wbc with predominance of neutrophils.Meanwhile pt presented with suicidal ideation and was admitted to behavioral health unit between 6/2-05/24/20. Today he underwent RIGHT HIP IRRIGATION AND DEBRIDEMENT, WITH EXCHANGE OF FEMORAL HEAD COMPONENT AND ANTIBIOTIC BEADS by Dr.Menz. I am asked to see him for antibiotic administration- he has not been on any antibiotics before surgery Past Medical History:  Diagnosis Date  . AIDS (acquired immune deficiency syndrome) (HCC)   . Arthritis   . Asthma   . Bipolar disorder (HCC)   . Bronchitis   . Complication of anesthesia   . Depression   . Dysrhythmia    1st degree heart block/ brady  . GERD (gastroesophageal reflux disease)   . Hepatitis C    treated  . HIV (human immunodeficiency virus infection) (HCC)   . HTN (hypertension)   . PONV (postoperative nausea and vomiting)     Past Surgical History:  Procedure Laterality Date  . APPLICATION OF  WOUND VAC Right 09/01/2019   Procedure: APPLICATION OF WOUND VAC;  Surgeon: Kennedy Bucker, MD;  Location: [facility] ORS;  Service: Orthopedics;  Laterality: Right;  Serial # [id number]  . HERNIA REPAIR Left    inguinal  . TOE SURGERY    . TOE SURGERY Right   . TOTAL HIP ARTHROPLASTY Right 09/01/2019   Procedure: TOTAL HIP ARTHROPLASTY ANTERIOR APPROACH;  Surgeon: Kennedy Bucker, MD;  Location: [facility] ORS;  Service: Orthopedics;  Laterality: Right;    Social History   Socioeconomic History  . Marital status: Divorced    Spouse name: Not on file  . Number of children: Not on file  . Years of education: Not on file  . Highest education level: Not on file  Occupational History  . Not on file  Tobacco Use  . Smoking status: Current Every Day Smoker    Packs/day: 0.25    Types: Cigarettes  . Smokeless tobacco: Never Used  Vaping Use  . Vaping Use: Never used  Substance and Sexual Activity  . Alcohol use: Yes    Alcohol/week: 84.0 standard drinks    Types: 84 Cans of beer per week    Comment: daily  . Drug use: Yes    Types: Cocaine, "Crack" cocaine    Comment: 05/24/20 says 9 days ago  . Sexual activity: Yes  Other Topics Concern  . Not on file  Social History Narrative  . Not on file   Social Determinants of Health   Financial Resource Strain:   .  Difficulty of Paying Living Expenses:   Food Insecurity:   . Worried About Programme researcher, broadcasting/film/video in the Last Year:   . Barista in the Last Year:   Transportation Needs:   . Freight forwarder (Medical):   Marland Kitchen Lack of Transportation (Non-Medical):   Physical Activity:   . Days of Exercise per Week:   . Minutes of Exercise per Session:   Stress:   . Feeling of Stress :   Social Connections:   . Frequency of Communication with Friends and Family:   . Frequency of Social Gatherings with Friends and Family:   . Attends Religious Services:   . Active Member of Clubs or Organizations:   . Attends Banker Meetings:    Marland Kitchen Marital Status:   Intimate Partner Violence:   . Fear of Current or Ex-Partner:   . Emotionally Abused:   Marland Kitchen Physically Abused:   . Sexually Abused:     Family History  Problem Relation Age of Onset  . Cancer Brother   . Uterine cancer Mother   . CAD Mother   . Hypertension Mother   . Hyperlipidemia Mother    Allergies  Allergen Reactions  . Amlodipine Swelling    Of the tongue  . Lisinopril Swelling  . Lactose Other (See Comments)    GI distress  . Pollen Extract Other (See Comments)    Itchy eyes and runny nose    ? Current Facility-Administered Medications  Medication Dose Route Frequency Provider Last Rate Last Admin  . 0.9 %  sodium chloride infusion   Intravenous Continuous Kennedy Bucker, MD      . Melene Muller ON 05/27/2020] acetaminophen (TYLENOL) tablet 325-650 mg  325-650 mg Oral Q6H PRN Kennedy Bucker, MD      . albuterol (VENTOLIN HFA) 108 (90 Base) MCG/ACT inhaler 1-2 puff  1-2 puff Inhalation Q4H PRN Kennedy Bucker, MD      . Melene Muller ON 05/27/2020] atorvastatin (LIPITOR) tablet 10 mg  10 mg Oral q1800 Kennedy Bucker, MD      . bictegravir-emtricitabine-tenofovir AF (BIKTARVY) 50-200-25 MG per tablet 1 tablet  1 tablet Oral Daily Kennedy Bucker, MD      . bisacodyl (DULCOLAX) suppository 10 mg  10 mg Rectal Daily PRN Kennedy Bucker, MD      . citalopram (CELEXA) tablet 40 mg  40 mg Oral Daily Kennedy Bucker, MD      . docusate sodium (COLACE) capsule 100 mg  100 mg Oral BID Kennedy Bucker, MD      . Melene Muller ON 05/27/2020] enoxaparin (LOVENOX) injection 40 mg  40 mg Subcutaneous Q24H Kennedy Bucker, MD      . gabapentin (NEURONTIN) capsule 300 mg  300 mg Oral BID Kennedy Bucker, MD      . hydrochlorothiazide (MICROZIDE) capsule 12.5 mg  12.5 mg Oral Daily Kennedy Bucker, MD      . HYDROmorphone (DILAUDID) injection 0.5-1 mg  0.5-1 mg Intravenous Q4H PRN Kennedy Bucker, MD      . hydrOXYzine (ATARAX/VISTARIL) tablet 10 mg  10 mg Oral TID PRN Kennedy Bucker, MD      . magnesium  citrate solution 1 Bottle  1 Bottle Oral Once PRN Kennedy Bucker, MD      . magnesium hydroxide (MILK OF MAGNESIA) suspension 30 mL  30 mL Oral Daily PRN Kennedy Bucker, MD      . menthol-cetylpyridinium (CEPACOL) lozenge 3 mg  1 lozenge Oral PRN Kennedy Bucker, MD  Or  . phenol (CHLORASEPTIC) mouth spray 1 spray  1 spray Mouth/Throat PRN Kennedy Bucker, MD      . methocarbamol (ROBAXIN) tablet 500 mg  500 mg Oral Q6H PRN Kennedy Bucker, MD       Or  . methocarbamol (ROBAXIN) 500 mg in dextrose 5 % 50 mL IVPB  500 mg Intravenous Q6H PRN Kennedy Bucker, MD      . metoCLOPramide (REGLAN) tablet 5-10 mg  5-10 mg Oral Q8H PRN Kennedy Bucker, MD       Or  . metoCLOPramide (REGLAN) injection 5-10 mg  5-10 mg Intravenous Q8H PRN Kennedy Bucker, MD      . ondansetron [facility]) tablet 4 mg  4 mg Oral Q6H PRN Kennedy Bucker, MD       Or  . ondansetron [facility]) injection 4 mg  4 mg Intravenous Q6H PRN Kennedy Bucker, MD      . oxyCODONE (Oxy IR/ROXICODONE) immediate release tablet 10-15 mg  10-15 mg Oral Q4H PRN Kennedy Bucker, MD      . oxyCODONE (Oxy IR/ROXICODONE) immediate release tablet 5-10 mg  5-10 mg Oral Q4H PRN Kennedy Bucker, MD      . traMADol Janean Sark) tablet 50 mg  50 mg Oral Q6H Kennedy Bucker, MD      . traZODone (DESYREL) tablet 300 mg  300 mg Oral QHS PRN Kennedy Bucker, MD      . zolpidem (AMBIEN) tablet 5 mg  5 mg Oral QHS PRN,MR X 1 Kennedy Bucker, MD         Abtx:  Anti-infectives (From admission, onward)   Start     Dose/Rate Route Frequency Ordered Stop   05/27/20 0600  ceFAZolin (ANCEF) IVPB 2g/100 mL premix        2 g 200 mL/hr over 30 Minutes Intravenous On call to O.R. 05/26/20 1204 05/26/20 1418   05/26/20 1815  bictegravir-emtricitabine-tenofovir AF (BIKTARVY) 50-200-25 MG per tablet 1 tablet     Discontinue     1 tablet Oral Daily 05/26/20 1808     05/26/20 1450  vancomycin (VANCOCIN) powder  Status:  Discontinued          As needed 05/26/20 1450 05/26/20 1608   05/26/20 1208   ceFAZolin (ANCEF) 2-4 GM/100ML-% IVPB       Note to Pharmacy: Desma Paganini   : cabinet override      05/26/20 1208 05/26/20 1418      REVIEW OF SYSTEMS:  Const: negative fever, negative chills, negative weight loss Eyes: negative diplopia or visual changes, negative eye pain ENT: negative coryza, negative sore throat Resp: negative cough, hemoptysis, dyspnea Cards: negative for chest pain, palpitations, lower extremity edema GU: negative for frequency, dysuria and hematuria GI: Negative for abdominal pain, diarrhea, bleeding, constipation Skin: negative for rash and pruritus Heme: negative for easy bruising and gum/nose bleeding MS: pain rt hip rt leg Neurolo:negative for headaches, dizziness, vertigo, memory problems  Psych:, depression  Endocrine: negative for thyroid, diabetes Allergy/Immunology-as above Objective:  VITALS:  BP 116/84 (BP Location: Right Arm)   Pulse 84   Temp 98.6 F (37 C)   Resp 17   Ht 5\' 9"  (1.753 m)   Wt 84.8 kg   SpO2 99%   BMI 27.62 kg/m  PHYSICAL EXAM:  General: Alert, cooperative, no distress, appears stated age.  Head: Normocephalic, without obvious abnormality, atraumatic. Eyes: Conjunctivae clear, anicteric sclerae. Pupils are equal ENT Nares normal. No drainage or sinus tenderness. Lips, mucosa, and tongue normal. No Thrush  Neck: Supple, symmetrical, no adenopathy, thyroid: non tender no carotid bruit and no JVD. Back: No CVA tenderness. Lungs: Clear to auscultation bilaterally. No Wheezing or Rhonchi. No rales. Heart: Regular rate and rhythm, no murmur, rub or gallop. Abdomen: Soft, non-tender,not distended. Bowel sounds normal. No masses Extremities: rt hip anterior  vac dressingSkin: No rashes or lesions. Or bruising Lymph: Cervical, supraclavicular normal. Neurologic: Grossly non-focal Pertinent Labs Lab Results CBC    Component Value Date/Time   WBC 5.4 05/25/2020 1212   RBC 4.02 (L) 05/25/2020 1212   HGB 13.5 05/25/2020  1212   HGB 12.9 (L) 01/22/2019 2208   HCT 39.7 05/25/2020 1212   HCT 38.0 01/22/2019 2208   PLT 258 05/25/2020 1212   PLT 214 01/22/2019 2208   MCV 98.8 05/25/2020 1212   MCV 98 (H) 01/22/2019 2208   MCV 99 01/06/2015 1505   MCH 33.6 05/25/2020 1212   MCHC 34.0 05/25/2020 1212   RDW 12.2 05/25/2020 1212   RDW 12.6 01/22/2019 2208   RDW 13.0 01/06/2015 1505   LYMPHSABS 2.2 05/25/2020 1212   LYMPHSABS 2.8 01/22/2019 2208   LYMPHSABS 2.6 01/06/2015 1505   MONOABS 0.5 05/25/2020 1212   MONOABS 0.3 01/06/2015 1505   EOSABS 0.1 05/25/2020 1212   EOSABS 0.1 01/22/2019 2208   EOSABS 0.1 01/06/2015 1505   BASOSABS 0.0 05/25/2020 1212   BASOSABS 0.0 01/22/2019 2208   BASOSABS 0.0 01/06/2015 1505    CMP Latest Ref Rng & Units 05/25/2020 05/15/2020 02/01/2020  Glucose 70 - 99 mg/dL 88 97 108(H)  BUN 6 - 20 mg/dL 18 20 28(H)  Creatinine 0.61 - 1.24 mg/dL 1.12 1.22 1.20  Sodium 135 - 145 mmol/L 139 138 138  Potassium 3.5 - 5.1 mmol/L 4.4 3.6 3.8  Chloride 98 - 111 mmol/L 100 106 107  CO2 22 - 32 mmol/L 30 25 22   Calcium 8.9 - 10.3 mg/dL 8.7(L) 8.4(L) 9.1  Total Protein 6.5 - 8.1 g/dL 7.5 7.0 -  Total Bilirubin 0.3 - 1.2 mg/dL 0.4 0.3 -  Alkaline Phos 38 - 126 U/L 77 76 -  AST 15 - 41 U/L 23 16 -  ALT 0 - 44 U/L 26 14 -      Microbiology: Recent Results (from the past 240 hour(s))  Surgical pcr screen     Status: None   Collection Time: 05/25/20 12:12 PM   Specimen: Nasal Mucosa; Nasal Swab  Result Value Ref Range Status   MRSA, PCR NEGATIVE NEGATIVE Final   Staphylococcus aureus NEGATIVE NEGATIVE Final    Comment: (NOTE) The Xpert SA Assay (FDA approved for NASAL specimens in patients 91 years of age and older), is one component of a comprehensive surveillance program. It is not intended to diagnose infection nor to guide or monitor treatment. Performed at [facility], [address] [place],  [address]   SARS Coronavirus 2 by RT PCR (hospital order,  performed in [facility] hospital lab) Nasopharyngeal Nasopharyngeal Swab     Status: None   Collection Time: 05/26/20  9:39 AM   Specimen: Nasopharyngeal Swab  Result Value Ref Range Status   SARS Coronavirus 2 NEGATIVE NEGATIVE Final    Comment: (NOTE) SARS-CoV-2 target nucleic acids are NOT DETECTED.  The SARS-CoV-2 RNA is generally detectable in upper and lower respiratory specimens during the acute phase of infection. The lowest concentration of SARS-CoV-2 viral copies this assay can detect is 250 copies / mL. A negative result does not preclude SARS-CoV-2 infection and should not  be used as the sole basis for treatment or other patient management decisions.  A negative result may occur with improper specimen collection / handling, submission of specimen other than nasopharyngeal swab, presence of viral mutation(s) within the areas targeted by this assay, and inadequate number of viral copies (<250 copies / mL). A negative result must be combined with clinical observations, patient history, and epidemiological information.  Fact Sheet for Patients:   BoilerBrush.com.cy  Fact Sheet for Healthcare Providers: https://pope.com/  This test is not yet approved or  cleared by the [place] FDA and has been authorized for detection and/or diagnosis of SARS-CoV-2 by FDA under an Emergency Use Authorization (EUA).  This EUA will remain in effect (meaning this test can be used) for the duration of the COVID-19 declaration under Section 564(b)(1) of the Act, 21 U.S.C. section 360bbb-3(b)(1), unless the authorization is terminated or revoked sooner.  Performed at [facility], [address] [place], [place] [address]     IMAGING RESULTS: RIGHT total hip arthroplasty and antibiotic bead placement. No gross complicating features I have personally reviewed the films ? 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 Cultures sent.  But the one from [place] [facility] done on 05/10/20 is negative. Will start vanco/ceftriaxone ?await cultures Will add AFB/fungal culturee as well Keep bacterial cultures for 21 days in the labs ? ___Rt THA- sept 2020  HIV on Biktarvy-In June 2020 had very low level viremia and . Cd4 > 500.  HEPC treated   Bipolar disorder on citalopram ________________________________________________ Discussed with patient, requesting provider

## 2020-05-26 NOTE — H&P (Signed)
Subjective:   Patient is a 54 y.o. male presents with right hip pain.  He underwent a right total hip in October of last year and did well up until April.  He started having increasing hip pain and subsequent hip aspiration a few weeks ago showed purulent material but nothing grew.  Plan was for a irrigation debridement at that time however he underwent hospitalization for psychiatric issues and surgery was then delayed because of that.  He now comes in for hip irrigation and debridement.  Patient Active Problem List   Diagnosis Date Noted  . Bipolar disorder (manic depression) ([place]) 05/18/2020  . Adjustment disorder with mixed anxiety and depressed mood 05/18/2020  . Arthritis pain, hip 05/18/2020  . 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 Angioedema 07/24/2019  . MDD (major depressive disorder), recurrent episode ([place]) 03/23/2019  . MDD (major depressive disorder), recurrent episode, moderate ([place]) 02/19/2019  . MDD (major depressive disorder), recurrent severe, without psychosis ([place]) 02/19/2019  . Chest pain 05/27/2018  . Major depressive disorder, recurrent severe without psychotic features ([place]) 05/27/2018  . ARF (acute renal failure) ([place]) 04/08/2018  . Malingering 03/07/2017  . Cocaine dependence ([place]) 02/19/2017  . Substance induced mood disorder ([place]) 02/19/2017  . HIV disease ([place]) 10/26/2016  . HTN (hypertension) 10/26/2016  . Dyslipidemia 10/26/2016  . BPH (benign prostatic hyperplasia) 10/26/2016  . Constipation 10/26/2016   Past Medical History:  Diagnosis Date  . AIDS (acquired immune deficiency syndrome) ([place])   . Arthritis   . Asthma   . Bipolar disorder ([place])   . Bronchitis   . Complication of anesthesia   . Depression   . Dysrhythmia    1st degree heart block/ brady  . GERD (gastroesophageal reflux disease)   . Hepatitis C    treated  . HIV (human immunodeficiency virus infection) ([place])   . HTN (hypertension)   . PONV (postoperative nausea  and vomiting)     Past Surgical History:  Procedure Laterality Date  . APPLICATION OF WOUND VAC Right 09/01/2019   Procedure: APPLICATION OF WOUND VAC;  Surgeon: Hessie Knows, MD;  Location: [facility] ORS;  Service: Orthopedics;  Laterality: Right;  Serial # [id number]  . HERNIA REPAIR Left    inguinal  . TOE SURGERY    . TOE SURGERY Right   . TOTAL HIP ARTHROPLASTY Right 09/01/2019   Procedure: TOTAL HIP ARTHROPLASTY ANTERIOR APPROACH;  Surgeon: Hessie Knows, MD;  Location: [facility] ORS;  Service: Orthopedics;  Laterality: Right;    Medications Prior to Admission  Medication Sig Dispense Refill Last Dose  . albuterol (VENTOLIN HFA) 108 (90 Base) MCG/ACT inhaler Inhale 1-2 puffs into the lungs every 4 (four) hours as needed for wheezing or shortness of breath. 18 g 1   . atorvastatin (LIPITOR) 10 MG tablet Take 1 tablet (10 mg total) by mouth daily at 6 PM. 30 tablet 1   . bictegravir-emtricitabine-tenofovir AF (BIKTARVY) 50-200-25 MG TABS tablet Take 1 tablet by mouth daily. 30 tablet 1   . citalopram (CELEXA) 40 MG tablet Take 1 tablet (40 mg total) by mouth daily. 30 tablet 1   . gabapentin (NEURONTIN) 300 MG capsule Take 1 capsule (300 mg total) by mouth 2 (two) times daily. 60 capsule 1   . hydrochlorothiazide (MICROZIDE) 12.5 MG capsule Take 1 capsule (12.5 mg total) by mouth daily. 30 capsule 1   . hydrOXYzine (ATARAX/VISTARIL) 10 MG tablet Take 1 tablet (10 mg total) by mouth 3 (three) times daily as needed for  anxiety. 60 tablet 1   . naproxen (NAPROSYN) 500 MG tablet Take 1 tablet (500 mg total) by mouth 2 (two) times daily with a meal. 60 tablet 0   . traZODone (DESYREL) 300 MG tablet Take 1 tablet (300 mg total) by mouth at bedtime as needed for sleep. 30 tablet 1    Allergies  Allergen Reactions  . Amlodipine Swelling    Of the tongue  . Lisinopril Swelling  . Lactose Other (See Comments)    GI distress  . Pollen Extract Other (See Comments)    Itchy eyes and runny nose     Social History   Tobacco Use  . Smoking status: Current Every Day Smoker    Packs/day: 0.25    Types: Cigarettes  . Smokeless tobacco: Never Used  Substance Use Topics  . Alcohol use: Yes    Alcohol/week: 84.0 standard drinks    Types: 84 Cans of beer per week    Comment: daily    Family History  Problem Relation Age of Onset  . Cancer Brother   . Uterine cancer Mother   . CAD Mother   . Hypertension Mother   . Hyperlipidemia Mother     Review of Systems Pertinent items are noted in HPI.  Objective:   No data found. No intake/output data recorded. No intake/output data recorded.    Ht 5\' 9"  (1.753 m)   Wt 84.8 kg   BMI 27.62 kg/m  Lungs: clear to auscultation bilaterally Heart: regular rate and rhythm, S1, S2 normal, no murmur, click, rub or gallop Extremities: Well-healed scar right anterior hip with no mass palpable Pulses: 2+ and symmetric Data Review prior hip aspiration showed over 90% PMNs but culture did not grow anything.  There is presumed infection.  This aspiration was from extracapsular abscess but presumed it communicates with the hip joint.  Assessment:   Abscess right hip  Plan:   Plan irrigation and debridement of the hip with multiple cultures to be obtained.  Antibiotic beads were placed around the joint, IV antibiotics will probably be required as well with consult to Dr. .

## 2020-05-26 NOTE — Op Note (Signed)
05/26/2020  4:13 PM  PATIENT:  [facility] Sr.  54 y.o. male  PRE-OPERATIVE DIAGNOSIS:  Primary osteoarthritis of right hip Trochanteric bursitis of right hip Status post total hip replacement, right  Septic arthritis with right total hip  POST-OPERATIVE DIAGNOSIS:  Primary osteoarthritis of right hip Trochanteric bursitis of right hip Status post total hip replacement, right  Septic arthritis right hip  PROCEDURE:  Procedure(s): RIGHT HIP IRRIGATION AND DEBRIDEMENT, WITH EXCHANGE OF FEMORAL HEAD COMPONENT AND ANTIBIOTIC BEADS, VERSAJET (Right)  SURGEON: Leitha Schuller, MD  ASSISTANTS: None  ANESTHESIA:   general  EBL:  Total I/O In: 1000 [I.V.:1000] Out: 500 [Blood:500]  BLOOD ADMINISTERED:none  DRAINS: Incisional wound VAC   LOCAL MEDICATIONS USED:  NONE  SPECIMEN:  Source of Specimen:  Multiple cultures  DISPOSITION OF SPECIMEN:  Microbiology  COUNTS:  YES  TOURNIQUET:  * No tourniquets in log *  IMPLANTS: Stimulant beads with vancomycin, 58 mm DM cup with 28 mm metal head  DICTATION: .Dragon Dictation patient was placed under general anesthesia and then placed on the operative table with the right foot in the mid active foot attachment left leg on a well-padded table.  After prepping and draping in the usual sterile fashion the prior skin incision was elliptically excised.  Incision was carried down to the tensor fascia lata which was incised and the tensor muscle retracted laterally.  The deep fascia was then opened with the anterior circumflex vessels already ligated from prior surgery.  The anterior capsule was exposed and incision made with that with gross pus present for cultures were obtained including fungal and AFB.  Capsulotomy was carried out and flap created to allow for self-retaining retractor which was placed in the proximal femur and acetabulum were exposed.  The dual mobility cup could not be dislocated and so the plastic DM liner was cut with  use of oscillating saw and removed in pieces so the ceramic head but could be dislocated out of the cup.  After this was done leg was adequately externally rotated and placed in extension with a ceramic head was knocked off the stem and the residual plastic removed from the acetabular cup.  The components appeared stable with no evidence of true sinuses or tracking from infection.  Versa jet was then used to clean the metal bone and surrounding tissues at different levels to get mechanical debridement initially following this and also having had sharp dissection with a scalpel to remove scar tissue around the capsule the wound was irrigated with 3 L of Betadine solution which was let initially left in place for 5 minutes.  After this had been flushed through the hip an additional 3 L of antibiotic irrigation was used with antibiotic with the pulse irrigator.  At this point the hip appeared relatively clean and so new components were placed with a metal M 28 mm head and 58 mm DM liner combined and placed onto the femoral component and reduced with some difficulty.  When the hip was reduced the wound was again irrigated and then stimulant beads with vancomycin were placed into the hip joint with closure of the deep fascia with a running Quill 3 OV lock and skin staples followed by incisional wound VAC.  PLAN OF CARE: Admit to inpatient   PATIENT DISPOSITION:  PACU - hemodynamically stable.

## 2020-05-27 LAB — CBC
HCT: 33.2 % — ABNORMAL LOW (ref 39.0–52.0)
Hemoglobin: 11.5 g/dL — ABNORMAL LOW (ref 13.0–17.0)
MCH: 32.8 pg (ref 26.0–34.0)
MCHC: 34.6 g/dL (ref 30.0–36.0)
MCV: 94.6 fL (ref 80.0–100.0)
Platelets: 252 10*3/uL (ref 150–400)
RBC: 3.51 MIL/uL — ABNORMAL LOW (ref 4.22–5.81)
RDW: 12.1 % (ref 11.5–15.5)
WBC: 10.3 10*3/uL (ref 4.0–10.5)
nRBC: 0 % (ref 0.0–0.2)

## 2020-05-27 LAB — BASIC METABOLIC PANEL
Anion gap: 7 (ref 5–15)
BUN: 16 mg/dL (ref 6–20)
CO2: 26 mmol/L (ref 22–32)
Calcium: 8.5 mg/dL — ABNORMAL LOW (ref 8.9–10.3)
Chloride: 102 mmol/L (ref 98–111)
Creatinine, Ser: 0.94 mg/dL (ref 0.61–1.24)
GFR calc Af Amer: >60 mL/min (ref >60)
GFR calc non Af Amer: >60 mL/min (ref >60)
Glucose, Bld: 123 mg/dL — ABNORMAL HIGH (ref 70–99)
Potassium: 5 mmol/L (ref 3.5–5.1)
Sodium: 135 mmol/L (ref 135–145)

## 2020-05-27 MED ORDER — CHLORHEXIDINE GLUCONATE CLOTH 2 % EX PADS
6.0000 | MEDICATED_PAD | Freq: Every day | CUTANEOUS | Status: DC
  Administered 2020-05-27 – 2020-05-31 (×4): 6 via TOPICAL

## 2020-05-27 MED ORDER — ENSURE ENLIVE PO LIQD
237.0000 mL | Freq: Two times a day (BID) | ORAL | Status: DC
  Administered 2020-05-28 – 2020-06-02 (×12): 237 mL via ORAL

## 2020-05-27 NOTE — Progress Notes (Signed)
Date of Admission:  05/26/2020   T Subjective: Doing okay Pain controlled   Medications:  . atorvastatin  10 mg Oral q1800  . bictegravir-emtricitabine-tenofovir AF  1 tablet Oral Daily  . Chlorhexidine Gluconate Cloth  6 each Topical Daily  . citalopram  40 mg Oral Daily  . docusate sodium  100 mg Oral BID  . enoxaparin (LOVENOX) injection  40 mg Subcutaneous Q24H  . [START ON 05/28/2020] feeding supplement (ENSURE ENLIVE)  237 mL Oral BID BM  . gabapentin  300 mg Oral BID  . hydrochlorothiazide  12.5 mg Oral Daily  . traMADol  50 mg Oral Q6H    Objective: Vital signs in last 24 hours: Temp:  [97.7 F (36.5 C)-98.7 F (37.1 C)] 98.7 F (37.1 C) (06/11 1546) Pulse Rate:  [53-81] 60 (06/11 1546) Resp:  [16-19] 18 (06/11 1546) BP: (111-132)/(56-87) 118/72 (06/11 1546) SpO2:  [98 %-100 %] 99 % (06/11 1546)  PHYSICAL EXAM:  General: Alert, cooperative, no distress, appears stated age.  Head: Normocephalic, without obvious abnormality, atraumatic. Eyes: Conjunctivae clear, anicteric sclerae. Pupils are equal ENT Nares normal. No drainage or sinus tenderness. Edentulous Neck: Supple, Lungs: Clear to auscultation bilaterally. No Wheezing or Rhonchi. No rales. Heart: Regular rate and rhythm, no murmur, rub or gallop. Abdomen soft Extremities: rt hip area- vac dressing present Skin: No rashes or lesions. Or bruising Lymph: Cervical, supraclavicular normal. Neurologic: Grossly non-focal  Lab Results Recent Labs    05/25/20 1212 05/25/20 1212 05/26/20 1832 05/27/20 0407  WBC 5.4   < > 13.4* 10.3  HGB 13.5   < > 12.6* 11.5*  HCT 39.7   < > 38.0* 33.2*  NA 139  --   --  135  K 4.4  --   --  5.0  CL 100  --   --  102  CO2 30  --   --  26  BUN 18  --   --  16  CREATININE 1.12   < > 1.10 0.94   < > = values in this interval not displayed.   Liver Panel Recent Labs    05/25/20 1212  PROT 7.5  ALBUMIN 3.6  AST 23  ALT 26  ALKPHOS 77  BILITOT 0.4    Sedimentation Rate No results for input(s): ESRSEDRATE in the last 72 hours. C-Reactive Protein No results for input(s): CRP in the last 72 hours.  Microbiology:  Studies/Results: DG HIP UNILAT W OR W/O PELVIS 2-3 VIEWS RIGHT  Result Date: 05/26/2020 CLINICAL DATA:  RIGHT irrigation and debridement with exchange of femoral component of RIGHT hip arthroplasty and antibiotic beads. EXAM: DG HIP (WITH OR WITHOUT PELVIS) 2-3V RIGHT COMPARISON:  04/02/2020 FINDINGS: RIGHT total hip arthroplasty changes noted. Antibiotic beads are identified. No gross complicating features are noted. IMPRESSION: RIGHT total hip arthroplasty and antibiotic bead placement. No gross complicating features. Electronically Signed   By: Harmon Pier M.D.   On: 05/26/2020 17:24     Assessment/Plan: Prosthetic joint infection rt hip- s/p debridement and exchange of femoral head component. Cultures sent.  But the one from [place] [facility] done on 05/10/20 is negative. on vanco/ceftriaxone ?await cultures Will add AFB/fungal culture as well Keep bacterial cultures for 21 days in the labs Pt is homeless and also uses cocaine- hence cannot be sent with PICC- he will need SNF ? ___Rt THA- sept 2020  HIV on Biktarvy-In June 2020 had very low level viremia and . Cd4 > 500.  HEPC treated  Bipolar disorder on citalopram Discussed the management with the patient and Dr.Menz

## 2020-05-27 NOTE — Progress Notes (Signed)
Pt sitting in bed. Denies pain. Scheduled meds given. Pt was able to void in bedside urinal. Call bell within reach. Pt denies any further needs at this time.

## 2020-05-27 NOTE — Progress Notes (Signed)
PT Cancellation Note  Patient Details Name: Roy [facility] Sr. MRN: [id number] DOB: [birth year]   Cancelled Treatment:    Reason Eval/Treat Not Completed: Other (comment). Consult received and chart reviewed. Pt currently eating breakfast and asks for therapy to return at later time. Will re-attempt   Roy [name] 05/27/2020, 8:45 AM Elizabeth Palau, PT, DPT [phone]

## 2020-05-27 NOTE — Progress Notes (Signed)
   Subjective: 1 Day Post-Op Procedure(s) (LRB): RIGHT HIP IRRIGATION AND DEBRIDEMENT, WITH EXCHANGE OF FEMORAL HEAD COMPONENT AND ANTIBIOTIC BEADS, VERSAJET (Right) Patient reports pain as mild.  Pain much improved after washout.  Patient had urinary retention last night.  Will remove Foley this morning. Patient is well, and has had no acute complaints or problems Denies any CP, SOB, ABD pain. We will continue therapy today.   Objective: Vital signs in last 24 hours: Temp:  [96.9 F (36.1 C)-98.6 F (37 C)] 98.2 F (36.8 C) (06/11 0533) Pulse Rate:  [50-113] 54 (06/11 0533) Resp:  [13-19] 16 (06/11 0533) BP: (92-132)/(60-95) 114/73 (06/11 0533) SpO2:  [95 %-100 %] 99 % (06/11 0533)  Intake/Output from previous day: 06/10 0701 - 06/11 0700 In: 1300 [I.V.:1100; IV Piggyback:200] Out: 3475 [Urine:2300; Drains:675; Blood:500] Intake/Output this shift: No intake/output data recorded.  Recent Labs    05/25/20 1212 05/26/20 1832 05/27/20 0407  HGB 13.5 12.6* 11.5*   Recent Labs    05/26/20 1832 05/27/20 0407  WBC 13.4* 10.3  RBC 3.82* 3.51*  HCT 38.0* 33.2*  PLT 237 252   Recent Labs    05/25/20 1212 05/25/20 1212 05/26/20 1832 05/27/20 0407  NA 139  --   --  135  K 4.4  --   --  5.0  CL 100  --   --  102  CO2 30  --   --  26  BUN 18  --   --  16  CREATININE 1.12   < > 1.10 0.94  GLUCOSE 88  --   --  123*  CALCIUM 8.7*  --   --  8.5*   < > = values in this interval not displayed.   Recent Labs    05/25/20 1212  INR 1.0    EXAM General - Patient is Alert, Appropriate and Oriented Extremity - Neurovascular intact Sensation intact distally Intact pulses distally Dorsiflexion/Plantar flexion intact No cellulitis present Compartment soft Dressing - dressing C/D/I, Praveena intact with 250 cc of bloody drainage Motor Function - intact, moving foot and toes well on exam.   Past Medical History:  Diagnosis Date  . AIDS (acquired immune deficiency  syndrome) ([facility])   . Arthritis   . Asthma   . Bipolar disorder ([facility])   . Bronchitis   . Complication of anesthesia   . Depression   . Dysrhythmia    1st degree heart block/ brady  . GERD (gastroesophageal reflux disease)   . Hepatitis C    treated  . HIV (human immunodeficiency virus infection) ([facility])   . HTN (hypertension)   . PONV (postoperative nausea and vomiting)     Assessment/Plan:   1 Day Post-Op Procedure(s) (LRB): RIGHT HIP IRRIGATION AND DEBRIDEMENT, WITH EXCHANGE OF FEMORAL HEAD COMPONENT AND ANTIBIOTIC BEADS, VERSAJET (Right) Active Problems:   Septic hip ([facility])  Estimated body mass index is 27.62 kg/m as calculated from the following:   Height as of this encounter: 5\' 9"  (1.753 m).   Weight as of this encounter: 84.8 kg. Advance diet Up with therapy  Continue with IV antibiotics per infectious disease. Intraoperative cultures pending Vital signs are stable Pain well controlled Care management to assist with discharge.  DVT Prophylaxis - Lovenox and SCDs Weight-Bearing as tolerated to right leg   T. , PA-C [facility] 05/27/2020, 7:56 AM

## 2020-05-27 NOTE — Progress Notes (Signed)
Initial Nutrition Assessment  DOCUMENTATION CODES:   Not applicable  INTERVENTION:  Ensure Enlive po BID, each supplement provides 350 kcal and 20 grams of protein (strawberry)  Dysphagia 3 (chopped meats)  NUTRITION DIAGNOSIS:   Increased nutrient needs related to wound healing as evidenced by estimated needs.    GOAL:   Patient will meet greater than or equal to 90% of their needs    MONITOR:   Labs, Skin, Supplement acceptance, PO intake, Weight trends  REASON FOR ASSESSMENT:   Malnutrition Screening Tool    ASSESSMENT:  54 year old male admitted for hip irrigation and debridement with history of right total hip replacement in October 2020. Past medical history significant for bipolar disorder, MDD, angioedema, HIV, HTN, HLD, GERD, and Hepatitis C  Very delightful patient reclined in bedside chair eating ice cream and watching television this afternoon. Patient reports having some slight pain around incision site with movement and feeling tired from anesthesia with occasional nausea with po intake. Patient reports that he is unable to finish meals and appetite is not as good as usual. Per flowsheets he has eaten 75-100% x 2 documented meals. Patient noted with poor dentition, reports slight discomfort when swallowing due to intubation. Patient amenable to D3 diet (chopped meats) as well as strawberry Ensure to aid with meeting needs.   Patient endorses some weight loss, recalls weighing ~200 lb 6 months ago. Patient reports that he used to work out all the time and was very muscular. Patient with good muscle tone, no fat or muscle depletions noted.  Current wt 186.56 lb Weight history reviewed, on 08/27/19 pt weighed 227.92 lbs, on  02/01/20 pt weighed 194.7 lb, on 04/02/20 weight increased to 209.66 lb, and on 05/15/20 his weight decreased to 194.7 lb. Unsure of accuracy of weight on 4/17.  NUTRITION - FOCUSED PHYSICAL EXAM: Completed, no fat or muscle depletions   Diet  Order:   Diet Order            DIET DYS 3 Room service appropriate? Yes; Fluid consistency: Thin  Diet effective now                 EDUCATION NEEDS:   Education needs have been addressed  Skin:  Skin Assessment: Skin Integrity Issues: Skin Integrity Issues:: Incisions Incisions: closed; R hip  Last BM:  6/9  Height:   Ht Readings from Last 1 Encounters:  05/24/20 5\' 9"  (1.753 m)    Weight:   Wt Readings from Last 1 Encounters:  05/24/20 84.8 kg    BMI:  Body mass index is 27.62 kg/m.  Estimated Nutritional Needs:   Kcal:  2200-2400  Protein:  110-120  Fluid:  >/= 2.2 L/day  07/24/20, RD, LDN Clinical Nutrition After Hours/Weekend Pager # in Amion

## 2020-05-27 NOTE — Evaluation (Signed)
Occupational Therapy Evaluation Patient Details Name: Petros Ahart [facility] Sr. MRN: [id number] DOB: July 17, 1966 Today's Date: 05/27/2020    History of Present Illness Tomio Kirk Sr. is a 54 y.o. male with a history of HIV, Bipolar disorder, treated hepC, is admitted for rt hip surgery, cocaine use but denies IVDA.   Clinical Impression   Mr Ekholm was seen for OT evaluation this date. Prior to hospital admission, pt was independent with ADLs/IADLs including vigorous yard work. Pt reports recent depressive episode including suicidal ideation resulting in behavioural unit admission - pt reports improvement and support from fiancee. However, pt unable to live c fiancee for more than 1 or 2 days - currently has no home to d/c. Pt presents to acute OT demonstrating impaired ADL performance and functional mobility 2/2 decreased LB access, functional strength/balance deficits, and decreased activity tolerance. At start of session pt reports pain as 3/10 - agreeable to OT session. Pt currently requires MIN A don L sock, MAX A don R sock seated EOB - posterior lean noted during funcional seated ADLs. CGA + RW tooth brushing standing sink side c L lean noted and VCs for standing rest breaks. MIN A + RW + elevated surface for SPT bed<>BSC c VCs for safe technique.  Pt would benefit from skilled OT to address noted impairments and functional limitations (see below for any additional details) in order to maximize safety and independence while minimizing falls risk and caregiver burden. Upon hospital discharge, recommend STR to maximize pt safety and return to PLOF.     Follow Up Recommendations  SNF    Equipment Recommendations   (TBD at next venue of care)    Recommendations for Other Services       Precautions / Restrictions Precautions Precautions: Fall Restrictions Weight Bearing Restrictions: Yes RLE Weight Bearing: Weight bearing as tolerated      Mobility Bed Mobility Overal bed  mobility: Needs Assistance Bed Mobility: Supine to Sit;Sit to Supine     Supine to sit: Min assist;HOB elevated Sit to supine: Min assist   General bed mobility comments: Assist for RLE advancement   Transfers Overall transfer level: Needs assistance Equipment used: Rolling walker (2 wheeled) Transfers: Sit to/from [employer] Sit to Stand: Min guard;From elevated surface Stand pivot transfers: Min guard;From elevated surface            Balance Overall balance assessment: Needs assistance Sitting-balance support: No upper extremity supported;Feet supported Sitting balance-Leahy Scale: Good Sitting balance - Comments: posterior leannoted during functional activity - able to self correct  Postural control: Posterior lean Standing balance support: No upper extremity supported;During functional activity Standing balance-Leahy Scale: Fair Standing balance comment: Pt bears majority of weight on LLE in standing                           ADL either performed or assessed with clinical judgement   ADL Overall ADL's : Needs assistance/impaired                                       General ADL Comments: MIN A don L sock, MAX A don R sock seated EOB - posterior lean noted during funcional seated ADLs. CGA + RW tooth brushing standing sink side c L lean noted and VCs for standing rest breaks. MIN A + RW for SPT bed<>BSC c VCs for safe  technique.      Vision         Perception     Praxis      Pertinent Vitals/Pain Pain Assessment: 0-10 Pain Score: 3  Pain Location: R hip Pain Descriptors / Indicators: Aching;Discomfort;Dull Pain Intervention(s): Limited activity within patient's tolerance;Ice applied     Hand Dominance     Extremity/Trunk Assessment Upper Extremity Assessment Upper Extremity Assessment: Overall WFL for tasks assessed (BUE 5/5 grossly)   Lower Extremity Assessment Lower Extremity Assessment: RLE  deficits/detail RLE Deficits / Details: 3+/5 R hip flexor       Communication Communication Communication: No difficulties   Cognition Arousal/Alertness: Awake/alert Behavior During Therapy: WFL for tasks assessed/performed Overall Cognitive Status: Within Functional Limits for tasks assessed                                     General Comments       Exercises Exercises: Other exercises Other Exercises Other Exercises: Pt educated re: OT role, d/c recs, WBing precautions, pain mgmt, falls prevention, safe t/f technique, energy conservation Other Exercises: Toilet t/f, tooth brushing, LBD, simulated UBD, bed mobility, sup<>sit, sit<>stand x2, sitting/standing balance/tolerance   Shoulder Instructions      Home Living Family/patient expects to be discharged to:: Unsure Living Arrangements: Alone                               Additional Comments: Pt reports unable to stay c finacee for more than 1 or 2 days upon d/c      Prior Functioning/Environment Level of Independence: Independent        Comments: Pt reports independence with ADLs/IADLs including vigorous yard work. Pt reports recent depressive episode including suicidal ideation resulting in behavioural unit admission - pt reports improvement         OT Problem List: Decreased strength;Decreased range of motion;Decreased activity tolerance;Decreased knowledge of use of DME or AE      OT Treatment/Interventions: Self-care/ADL training;Therapeutic exercise;Energy conservation;DME and/or AE instruction;Therapeutic activities;Patient/family education;Balance training    OT Goals(Current goals can be found in the care plan section) Acute Rehab OT Goals Patient Stated Goal: To return to PLOF  OT Goal Formulation: With patient Time For Goal Achievement: 06/10/20 Potential to Achieve Goals: Good ADL Goals Pt Will Perform Lower Body Dressing: with min assist;sit to/from stand (c LRAD PRN) Pt  Will Transfer to Toilet: with min guard assist;ambulating;regular height toilet (c LRAD PRN) Additional ADL Goal #1: Pt will independently verbalize plan to implement x3 energy conservation strategies  OT Frequency: Min 2X/week   Barriers to D/C: Inaccessible home environment;Decreased caregiver support          Co-evaluation              AM-PAC OT "6 Clicks" Daily Activity     Outcome Measure Help from another person eating meals?: None Help from another person taking care of personal grooming?: A Little Help from another person toileting, which includes using toliet, bedpan, or urinal?: A Little Help from another person bathing (including washing, rinsing, drying)?: A Lot Help from another person to put on and taking off regular upper body clothing?: A Little Help from another person to put on and taking off regular lower body clothing?: A Lot 6 Click Score: 17   End of Session Equipment Utilized During Treatment: Surveyor, mining Communication:  Other (comment) (NT advised pt foley near full)  Activity Tolerance: Patient tolerated treatment well Patient left: in bed;with call bell/phone within reach;with bed alarm set  OT Visit Diagnosis: Unsteadiness on feet (R26.81);Other abnormalities of gait and mobility (R26.89)                Time: 3128-1188 OT Time Calculation (min): 29 min Charges:  OT General Charges $OT Visit: 1 Visit OT Evaluation $OT Eval Moderate Complexity: 1 Mod OT Treatments $Self Care/Home Management : 23-37 mins  Kathie Dike, M.S. OTR/L  05/27/20, 10:10 AM

## 2020-05-27 NOTE — Plan of Care (Signed)

## 2020-05-27 NOTE — Anesthesia Postprocedure Evaluation (Signed)
Anesthesia Post Note  Patient: Roy Waynick Sipe Sr.  Procedure(s) Performed: RIGHT HIP IRRIGATION AND DEBRIDEMENT, WITH EXCHANGE OF FEMORAL HEAD COMPONENT AND ANTIBIOTIC BEADS, VERSAJET (Right Hip)  Patient location during evaluation: PACU Anesthesia Type: General Level of consciousness: awake and alert Pain management: pain level controlled Vital Signs Assessment: post-procedure vital signs reviewed and stable Respiratory status: spontaneous breathing, nonlabored ventilation, respiratory function stable and patient connected to nasal cannula oxygen Cardiovascular status: blood pressure returned to baseline and stable Postop Assessment: no apparent nausea or vomiting Anesthetic complications: no   No complications documented.   Last Vitals:  Vitals:   05/27/20 0153 05/27/20 0533  BP: 122/76 114/73  Pulse: (!) 57 (!) 54  Resp: 19 16  Temp: 36.7 C 36.8 C  SpO2: 100% 99%    Last Pain:  Vitals:   05/27/20 0533  TempSrc: Oral  PainSc:                  Lenard Simmer

## 2020-05-27 NOTE — Progress Notes (Signed)
MD notified of pt's inability to void and urinary retention. Orders for Foley cath.

## 2020-05-27 NOTE — Evaluation (Signed)
Physical Therapy Evaluation Patient Details Name: Roy [name] [facility] Sr. MRN: [id number] DOB: 09/20/1966 Today's Date: 05/27/2020   History of Present Illness  Pt admitted for septic R hip and is s/p I&D with exchange of femoral head and bead placement on 05/26/20. History includes previous R THR (2020), bipolar, HIV, MDD, cocaine, and HTN   Clinical Impression  Pt is a pleasant 54 year old male who was admitted for septic R hip and is s/p I&D. Pt performs bed mobility with min assist, transfers with cga, and ambulation with cga and RW. Needs consistent cues for sequencing/safety. Pt demonstrates deficits with strength/mobility/pain. Pt was independent prior to admission and is currently not at baseline level. Would benefit from skilled PT to address above deficits and promote optimal return to PLOF; recommend transition to STR upon discharge from acute hospitalization. Expect quick progress, may be able to upgrade disposition depending on progress made throughout stay.     Follow Up Recommendations SNF    Equipment Recommendations  Rolling walker with 5" wheels    Recommendations for Other Services       Precautions / Restrictions Precautions Precautions: Fall Restrictions Weight Bearing Restrictions: Yes RLE Weight Bearing: Weight bearing as tolerated      Mobility  Bed Mobility Overal bed mobility: Needs Assistance Bed Mobility: Supine to Sit;Sit to Supine     Supine to sit: Min assist;HOB elevated Sit to supine: Min assist   General bed mobility comments: Assist for RLE advancement   Transfers Overall transfer level: Needs assistance Equipment used: Rolling walker (2 wheeled) Transfers: Sit to/from [employer] Sit to Stand: Min guard         General transfer comment: safe technique with RW. LImited WBing through R LE. Cues for safety using RW  Ambulation/Gait Ambulation/Gait assistance: Min guard Gait Distance (Feet): 70 Feet Assistive device:  Rolling walker (2 wheeled) Gait Pattern/deviations: Step-to pattern     General Gait Details: ambulated with limited WBing/guarding noted on R LE. Antalgic gait. Cues for sequencing and upright posture as he tends to look down at ground. 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[employer] (Stroke Patients Only)       Balance Overall balance assessment: Needs assistance Sitting-balance support: No upper extremity supported;Feet supported Sitting balance-Leahy Scale: Good     Standing balance support: Bilateral upper extremity supported Standing balance-Leahy Scale: Fair                               Pertinent Vitals/Pain Pain Assessment: 0-10 Pain Score: 3  Pain Location: R hip Pain Descriptors / Indicators: Aching;Discomfort;Dull Pain Intervention(s): Limited activity within patient's tolerance    Home Living Family/patient expects to be discharged to:: Shelter/Homeless                 Additional Comments: Pt reports unable to stay c finacee for more than 1 or 2 days upon d/c. Reports he has unsafe place to dc. Of note, CSW gave resources for financial assistance for housing    Prior Function Level of Independence: Independent         Comments: Pt reports independence with ADLs/IADLs including vigorous yard work. Pt reports recent depressive episode including suicidal ideation resulting in behavioural unit admission - pt reports improvement      Hand Dominance        Extremity/Trunk Assessment  Upper Extremity Assessment Upper Extremity Assessment: Overall WFL for tasks assessed    Lower Extremity Assessment Lower Extremity Assessment: Generalized weakness (R LE grossly 3+/5; L LE grossly 4/5)       Communication   Communication: No difficulties  Cognition Arousal/Alertness: Awake/alert Behavior During Therapy: WFL for tasks assessed/performed Overall Cognitive Status: Within Functional Limits for  tasks assessed                                        General Comments      Exercises Other Exercises Other Exercises: supine ther-ex performed on R LE including AP, quad sets, glut sets, and hip abd/add. All ther-ex performed x 10 reps with min assist and safe technique. Minimal pain noted   Assessment/Plan    PT Assessment Patient needs continued PT services  PT Problem List Decreased strength;Decreased activity tolerance;Decreased balance;Decreased mobility;Pain       PT Treatment Interventions DME instruction;Gait training;Therapeutic activities;Therapeutic exercise;Balance training    PT Goals (Current goals can be found in the Care Plan section)  Acute Rehab PT Goals Patient Stated Goal: To return to PLOF  PT Goal Formulation: With patient Time For Goal Achievement: 06/10/20 Potential to Achieve Goals: Good    Frequency 7X/week   Barriers to discharge        Co-evaluation               AM-PAC PT "6 Clicks" Mobility  Outcome Measure Help needed turning from your back to your side while in a flat bed without using bedrails?: A Little Help needed moving from lying on your back to sitting on the side of a flat bed without using bedrails?: A Little Help needed moving to and from a bed to a chair (including a wheelchair)?: A Little Help needed standing up from a chair using your arms (e.g., wheelchair or bedside chair)?: A Little Help needed to walk in hospital room?: A Little Help needed climbing 3-5 steps with a railing? : A Little 6 Click Score: 18    End of Session Equipment Utilized During Treatment: Gait belt Activity Tolerance: Patient tolerated treatment well Patient left: in chair;with chair alarm set;with SCD's reapplied Nurse Communication: Mobility status PT Visit Diagnosis: Muscle weakness (generalized) (M62.81);Difficulty in walking, not elsewhere classified (R26.2);Pain Pain - Right/Left: Right Pain - part of body: Hip     Time: 1324-4010 PT Time Calculation (min) (ACUTE ONLY): 29 min   Charges:   PT Evaluation $PT Eval Low Complexity: 1 Low PT Treatments $Therapeutic Exercise: 8-22 mins        Elizabeth Palau, PT, DPT [phone]   Denym Rahimi 05/27/2020, 3:57 PM

## 2020-05-28 LAB — URINALYSIS, COMPLETE (UACMP) WITH MICROSCOPIC
Bilirubin Urine: NEGATIVE
Glucose, UA: NEGATIVE mg/dL
Hgb urine dipstick: NEGATIVE
Ketones, ur: NEGATIVE mg/dL
Leukocytes,Ua: NEGATIVE
Nitrite: NEGATIVE
Protein, ur: NEGATIVE mg/dL
Specific Gravity, Urine: 1.011 (ref 1.005–1.030)
Squamous Epithelial / HPF: NONE SEEN (ref 0–5)
pH: 7 (ref 5.0–8.0)

## 2020-05-28 LAB — CBC
HCT: 31.6 % — ABNORMAL LOW (ref 39.0–52.0)
Hemoglobin: 10.8 g/dL — ABNORMAL LOW (ref 13.0–17.0)
MCH: 32.8 pg (ref 26.0–34.0)
MCHC: 34.2 g/dL (ref 30.0–36.0)
MCV: 96 fL (ref 80.0–100.0)
Platelets: 213 10*3/uL (ref 150–400)
RBC: 3.29 MIL/uL — ABNORMAL LOW (ref 4.22–5.81)
RDW: 12.2 % (ref 11.5–15.5)
WBC: 8.3 10*3/uL (ref 4.0–10.5)
nRBC: 0 % (ref 0.0–0.2)

## 2020-05-28 LAB — ACID FAST SMEAR (AFB, MYCOBACTERIA): Acid Fast Smear: NEGATIVE

## 2020-05-28 NOTE — Progress Notes (Signed)
Physical Therapy Treatment Patient Details Name: Roy [name] [facility] Sr. MRN: [id number] DOB: [birth year] Today's Date: 05/28/2020    History of Present Illness Pt admitted for septic R hip and is s/p I&D with exchange of femoral head and bead placement on 05/26/20. History includes previous R THR (2020), bipolar, HIV, MDD, cocaine, and HTN     PT Comments    Patient agrees to PT treatment. He needs min assist for sit <> supine bed mobility , min assist for sit to stand transfers with RW and min assist for gait training with RW 300 feet x 1. Patient has no loss of balance in sitting or standing and has -3/5 strength to right hip flexors.Patient will continue to benefit from skilled PT to improve mobility and strength.   Follow Up Recommendations  SNF     Equipment Recommendations  Rolling walker with 5" wheels    Recommendations for Other Services       Precautions / Restrictions Precautions Precautions: Fall Restrictions Weight Bearing Restrictions: Yes RLE Weight Bearing: Weight bearing as tolerated    Mobility  Bed Mobility Overal bed mobility: Needs Assistance Bed Mobility: Supine to Sit;Sit to Supine     Supine to sit: Min assist;HOB elevated Sit to supine: Min assist   General bed mobility comments: Assist for RLE advancement   Transfers Overall transfer level: Needs assistance Equipment used: Rolling walker (2 wheeled) Transfers: Sit to/from [employer] Sit to Stand: Min guard         General transfer comment: safe technique with RW. LImited WBing through R LE. Cues for safety using RW  Ambulation/Gait Ambulation/Gait assistance: Min guard Gait Distance (Feet): 300 Feet Assistive device: Rolling walker (2 wheeled) Gait Pattern/deviations: Step-to pattern     General Gait Details: ambulated with limited WBing/guarding noted on R LE. Antalgic gait. Cues for sequencing and upright posture as he tends to look down at ground. 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[employer] (Stroke Patients Only)       Balance Overall balance assessment: Needs assistance Sitting-balance support: No upper extremity supported;Feet supported Sitting balance-Leahy Scale: Good     Standing balance support: Bilateral upper extremity supported Standing balance-Leahy Scale: Fair                              Cognition Arousal/Alertness: Awake/alert Behavior During Therapy: WFL for tasks assessed/performed Overall Cognitive Status: Within Functional Limits for tasks assessed                                        Exercises      General Comments        Pertinent Vitals/Pain Pain Assessment: Faces Faces Pain Scale: Hurts a little bit Pain Location:  (right hip) Pain Descriptors / Indicators: Aching;Discomfort Pain Intervention(s): Limited activity within patient's tolerance    Home Living                      Prior Function            PT Goals (current goals can now be found in the care plan section) Acute Rehab PT Goals Patient Stated Goal: To return to PLOF     Frequency    7X/week  PT Plan      Co-evaluation              AM-PAC PT "6 Clicks" Mobility   Outcome Measure  Help needed turning from your back to your side while in a flat bed without using bedrails?: A Little Help needed moving from lying on your back to sitting on the side of a flat bed without using bedrails?: A Little Help needed moving to and from a bed to a chair (including a wheelchair)?: A Little Help needed standing up from a chair using your arms (e.g., wheelchair or bedside chair)?: A Little Help needed to walk in hospital room?: A Little Help needed climbing 3-5 steps with a railing? : A Little 6 Click Score: 18    End of Session Equipment Utilized During Treatment: Gait belt Activity Tolerance: Patient tolerated treatment well Patient left: in  bed;with bed alarm set Nurse Communication: Mobility status PT Visit Diagnosis: Muscle weakness (generalized) (M62.81);Difficulty in walking, not elsewhere classified (R26.2);Pain Pain - Right/Left: Right Pain - part of body: Hip     Time: 9688-6484 PT Time Calculation (min) (ACUTE ONLY): 25 min  Charges:  $Gait Training: 8-22 mins $Therapeutic Activity: 8-22 mins                        Ezekiel Ina, PT DPT 05/28/2020, 4:26 PM

## 2020-05-28 NOTE — Progress Notes (Signed)
   Subjective: 2 Days Post-Op Procedure(s) (LRB): RIGHT HIP IRRIGATION AND DEBRIDEMENT, WITH EXCHANGE OF FEMORAL HEAD COMPONENT AND ANTIBIOTIC BEADS, VERSAJET (Right) Patient reports pain as mild.  Pain much improved after washout.  Patient had bowel movement. Patient is well, and has had no acute complaints or problems Denies any CP, SOB, ABD pain. We will continue therapy today.   Objective: Vital signs in last 24 hours: Temp:  [98.1 F (36.7 C)-99.3 F (37.4 C)] 98.1 F (36.7 C) (06/12 0356) Pulse Rate:  [53-60] 53 (06/12 0356) Resp:  [17-20] 17 (06/12 0356) BP: (111-127)/(56-81) 111/65 (06/12 0356) SpO2:  [97 %-100 %] 100 % (06/12 0356) Weight:  [84.8 kg] 84.8 kg (06/11 2033)  Intake/Output from previous day: 06/11 0701 - 06/12 0700 In: 1861 [P.O.:970; I.V.:291; IV Piggyback:600] Out: 3150 [Urine:3150] Intake/Output this shift: No intake/output data recorded.  Recent Labs    05/25/20 1212 05/26/20 1832 05/27/20 0407 05/28/20 0522  HGB 13.5 12.6* 11.5* 10.8*   Recent Labs    05/27/20 0407 05/28/20 0522  WBC 10.3 8.3  RBC 3.51* 3.29*  HCT 33.2* 31.6*  PLT 252 213   Recent Labs    05/25/20 1212 05/25/20 1212 05/26/20 1832 05/27/20 0407  NA 139  --   --  135  K 4.4  --   --  5.0  CL 100  --   --  102  CO2 30  --   --  26  BUN 18  --   --  16  CREATININE 1.12   < > 1.10 0.94  GLUCOSE 88  --   --  123*  CALCIUM 8.7*  --   --  8.5*   < > = values in this interval not displayed.   Recent Labs    05/25/20 1212  INR 1.0    EXAM General - Patient is Alert, Appropriate and Oriented Extremity - Neurovascular intact Sensation intact distally Intact pulses distally Dorsiflexion/Plantar flexion intact No cellulitis present Compartment soft Dressing - dressing C/D/I, Praveena intact with total of 675 cc of bloody drainage Motor Function - intact, moving foot and toes well on exam.  The patient ambulated 70 feet with physical therapy.  Past Medical  History:  Diagnosis Date  . AIDS (acquired immune deficiency syndrome) (HCC)   . Arthritis   . Asthma   . Bipolar disorder (HCC)   . Bronchitis   . Complication of anesthesia   . Depression   . Dysrhythmia    1st degree heart block/ brady  . GERD (gastroesophageal reflux disease)   . Hepatitis C    treated  . HIV (human immunodeficiency virus infection) (HCC)   . HTN (hypertension)   . PONV (postoperative nausea and vomiting)     Assessment/Plan:   2 Days Post-Op Procedure(s) (LRB): RIGHT HIP IRRIGATION AND DEBRIDEMENT, WITH EXCHANGE OF FEMORAL HEAD COMPONENT AND ANTIBIOTIC BEADS, VERSAJET (Right) Active Problems:   Septic hip (HCC)  Estimated body mass index is 27.61 kg/m as calculated from the following:   Height as of this encounter: 5\' 9"  (1.753 m).   Weight as of this encounter: 84.8 kg. Advance diet Up with therapy  Continue with IV antibiotics per infectious disease.  Currently on vancomycin and ceftriaxone.  Appreciate infectious disease input. Intraoperative cultures pending Vital signs are stable Pain well controlled Care management to assist with discharge.  DVT Prophylaxis - Lovenox and SCDs Weight-Bearing as tolerated to right leg   , PA-C [facility] 05/28/2020, 7:15 AM

## 2020-05-28 NOTE — Progress Notes (Signed)
Pt c/o of dysuria, frequency. MD notified. UA and Culture ordered, labs pending now. [name] R FA IV infiltrated, RN removed IV and applied ice to site. RN inserted new IV in L UA, fluids infusing. Pt denies any more needs at this time, call bell within reach.

## 2020-05-29 LAB — URINE CULTURE: Culture: NO GROWTH

## 2020-05-29 LAB — CREATININE, SERUM
Creatinine, Ser: 1.08 mg/dL (ref 0.61–1.24)
GFR calc Af Amer: >60 mL/min (ref >60)
GFR calc non Af Amer: >60 mL/min (ref >60)

## 2020-05-29 LAB — VANCOMYCIN, RANDOM: Vancomycin Rm: 13

## 2020-05-29 NOTE — Plan of Care (Signed)
  Problem: Education: Goal: Knowledge of the prescribed therapeutic regimen will improve Outcome: Progressing   Problem: Activity: Goal: Ability to avoid complications of mobility impairment will improve Outcome: Progressing Goal: Ability to tolerate increased activity will improve Outcome: Progressing   Problem: Clinical Measurements: Goal: Postoperative complications will be avoided or minimized Outcome: Progressing   Problem: Pain Management: Goal: Pain level will decrease with appropriate interventions Outcome: Progressing   Problem: Clinical Measurements: Goal: Ability to maintain clinical measurements within normal limits will improve Outcome: Progressing Goal: Will remain free from infection Outcome: Progressing Goal: Diagnostic test results will improve Outcome: Progressing Goal: Respiratory complications will improve Outcome: Progressing   Problem: Activity: Goal: Risk for activity intolerance will decrease Outcome: Progressing

## 2020-05-29 NOTE — Consult Note (Signed)
Pharmacy Antibiotic Note  Roy Fury Sr. is a 54 y.o. male admitted on 05/26/2020 with septic total hip s/p debridement and exchange of femoral head component.  Pharmacy was consulted for vancomycin dosing. This is day # 4 of broad-spectrum antibiotics, leukocytosis resolved, renal function stable and at baseline, wound cultures are pending results.  06/13 0921am vancomycin trough: 13 mcg/mL  Plan: continue vancomycin 1000 mg IV Q12 hours  Check SCr in am   Height: 5\' 9"  (175.3 cm) Weight: 84.8 kg (186 lb 15.2 oz) IBW/kg (Calculated) : 70.7  Temp (24hrs), Avg:98.8 F (37.1 C), Min:98.6 F (37 C), Max:99.1 F (37.3 C)  Recent Labs  Lab 05/25/20 1212 05/26/20 1832 05/27/20 0407 05/28/20 0522 05/29/20 0922  WBC 5.4 13.4* 10.3 8.3  --   CREATININE 1.12 1.10 0.94  --  1.08    Estimated Creatinine Clearance: 77.3 mL/min (by C-G formula based on SCr of 1.08 mg/dL).     Antimicrobials this admission: Vancomycin 6/10 >>  Ceftriaxone 6/11 >>  Microbiology results: 6/10 Wound Cx (right joint): pending 6/12 UCx: NG final   Thank you for allowing pharmacy to be a part of this patients care.  8/12 05/29/2020 12:08 PM

## 2020-05-29 NOTE — Progress Notes (Signed)
   Subjective: 3 Days Post-Op Procedure(s) (LRB): RIGHT HIP IRRIGATION AND DEBRIDEMENT, WITH EXCHANGE OF FEMORAL HEAD COMPONENT AND ANTIBIOTIC BEADS, VERSAJET (Right) Patient reports pain as mild.  Pain much improved after washout.  Patient had bowel movement. Patient is well, and has had no acute complaints or problems Denies any CP, SOB, ABD pain. We will continue therapy today.   Objective: Vital signs in last 24 hours: Temp:  [98.5 F (36.9 C)-99.1 F (37.3 C)] 99.1 F (37.3 C) (06/13 0005) Pulse Rate:  [56-59] 58 (06/13 0005) Resp:  [15-20] 17 (06/13 0005) BP: (103-121)/(66-84) 117/71 (06/13 0005) SpO2:  [96 %-99 %] 96 % (06/13 0005)  Intake/Output from previous day: 06/12 0701 - 06/13 0700 In: 360 [P.O.:360] Out: 3690 [Urine:3690] Intake/Output this shift: No intake/output data recorded.  Recent Labs    05/26/20 1832 05/27/20 0407 05/28/20 0522  HGB 12.6* 11.5* 10.8*   Recent Labs    05/27/20 0407 05/28/20 0522  WBC 10.3 8.3  RBC 3.51* 3.29*  HCT 33.2* 31.6*  PLT 252 213   Recent Labs    05/26/20 1832 05/27/20 0407  NA  --  135  K  --  5.0  CL  --  102  CO2  --  26  BUN  --  16  CREATININE 1.10 0.94  GLUCOSE  --  123*  CALCIUM  --  8.5*   No results for input(s): LABPT, INR in the last 72 hours.  EXAM General - Patient is Alert, Appropriate and Oriented Extremity - Neurovascular intact Sensation intact distally Intact pulses distally Dorsiflexion/Plantar flexion intact No cellulitis present Compartment soft Dressing - dressing C/D/I, Praveena intact with total of 675 cc of bloody drainage Motor Function - intact, moving foot and toes well on exam.  The patient ambulated 300 feet with physical therapy.  Past Medical History:  Diagnosis Date  . AIDS (acquired immune deficiency syndrome) (HCC)   . Arthritis   . Asthma   . Bipolar disorder (HCC)   . Bronchitis   . Complication of anesthesia   . Depression   . Dysrhythmia    1st degree  heart block/ brady  . GERD (gastroesophageal reflux disease)   . Hepatitis C    treated  . HIV (human immunodeficiency virus infection) (HCC)   . HTN (hypertension)   . PONV (postoperative nausea and vomiting)     Assessment/Plan:   3 Days Post-Op Procedure(s) (LRB): RIGHT HIP IRRIGATION AND DEBRIDEMENT, WITH EXCHANGE OF FEMORAL HEAD COMPONENT AND ANTIBIOTIC BEADS, VERSAJET (Right) Active Problems:   Septic hip (HCC)  Estimated body mass index is 27.61 kg/m as calculated from the following:   Height as of this encounter: 5\' 9"  (1.753 m).   Weight as of this encounter: 84.8 kg. Advance diet Up with therapy  Continue with IV antibiotics per infectious disease.  Currently on vancomycin and ceftriaxone.  Appreciate infectious disease input. Intraoperative cultures pending Vital signs are stable Pain well controlled Care management to assist with discharge.  DVT Prophylaxis - Lovenox and SCDs Weight-Bearing as tolerated to right leg   , PA-C [facility] 05/29/2020, 7:12 AM

## 2020-05-29 NOTE — Plan of Care (Signed)
  Problem: Education: Goal: Understanding of discharge needs will improve Outcome: Progressing   

## 2020-05-29 NOTE — Plan of Care (Signed)
  Problem: Pain Management: Goal: Pain level will decrease with appropriate interventions Outcome: Progressing   Problem: Skin Integrity: Goal: Will show signs of wound healing Outcome: Progressing   Problem: Pain Managment: Goal: General experience of comfort will improve Outcome: Progressing   

## 2020-05-29 NOTE — Progress Notes (Signed)
Physical Therapy Treatment Patient Details Name: Roy [name] [facility] Sr. MRN: [id number] DOB: 1966-04-08 Today's Date: 05/29/2020    History of Present Illness Pt admitted for septic R hip and is s/p I&D with exchange of femoral head and bead placement on 05/26/20. History includes previous R THR (2020), bipolar, HIV, MDD, cocaine, and HTN     PT Comments    Pt ready for session.  Stated poor sleep last night due to frequent voiding but overall feels well.  In and OOB with increased time and self assist of RUE.  Stood with min guard and is able to progress gait x 2 laps around unit today.  Steady gait with no LOB or buckling.  Pt stated he is surprised he is moving so well after this surgery.  Pt is progressing well with mobility.  Will leave recommendations at SNF as he is unable to discharge home with PICC per notes.   Follow Up Recommendations  SNF     Equipment Recommendations  Rolling walker with 5" wheels    Recommendations for Other Services       Precautions / Restrictions Precautions Precautions: Fall Restrictions Weight Bearing Restrictions: Yes RLE Weight Bearing: Weight bearing as tolerated    Mobility  Bed Mobility Overal bed mobility: Modified Independent Bed Mobility: Supine to Sit;Sit to Supine     Supine to sit: Modified independent (Device/Increase time) Sit to supine: Modified independent (Device/Increase time)   General bed mobility comments: self assist of LE  Transfers Overall transfer level: Needs assistance Equipment used: Rolling walker (2 wheeled) Transfers: Sit to/from Stand Sit to Stand: Min guard Stand pivot transfers: Min guard;From elevated surface          Ambulation/Gait Ambulation/Gait assistance: Min guard Gait Distance (Feet): 340 Feet Assistive device: Rolling walker (2 wheeled) Gait Pattern/deviations: Step-to pattern Gait velocity: decreased       Stairs             Wheelchair Mobility    Modified Rankin  (Stroke Patients Only)       Balance Overall balance assessment: Modified Independent;Needs assistance Sitting-balance support: Feet supported Sitting balance-Leahy Scale: Good     Standing balance support: Bilateral upper extremity supported Standing balance-Leahy Scale: Good Standing balance comment: less UE assist for gait today                            Cognition Arousal/Alertness: Awake/alert Behavior During Therapy: WFL for tasks assessed/performed Overall Cognitive Status: Within Functional Limits for tasks assessed                                        Exercises      General Comments        Pertinent Vitals/Pain Pain Assessment: 0-10 Pain Score: 1  Pain Location: feels stictches pulling but no pain in joint Pain Descriptors / Indicators: Discomfort Pain Intervention(s): Limited activity within patient's tolerance;Monitored during session    Home Living                      Prior Function            PT Goals (current goals can now be found in the care plan section) Progress towards PT goals: Progressing toward goals    Frequency    7X/week      PT Plan  Co-evaluation              AM-PAC PT "6 Clicks" Mobility   Outcome Measure  Help needed turning from your back to your side while in a flat bed without using bedrails?: None Help needed moving from lying on your back to sitting on the side of a flat bed without using bedrails?: None Help needed moving to and from a bed to a chair (including a wheelchair)?: A Little Help needed standing up from a chair using your arms (e.g., wheelchair or bedside chair)?: A Little Help needed to walk in hospital room?: A Little Help needed climbing 3-5 steps with a railing? : A Little 6 Click Score: 20    End of Session Equipment Utilized During Treatment: Gait belt Activity Tolerance: Patient tolerated treatment well Patient left: in bed;with bed alarm  set Nurse Communication: Mobility status PT Visit Diagnosis: Muscle weakness (generalized) (M62.81);Difficulty in walking, not elsewhere classified (R26.2);Pain Pain - Right/Left: Right Pain - part of body: Hip     Time: 7290-2111 PT Time Calculation (min) (ACUTE ONLY): 23 min  Charges:  $Gait Training: 23-37 mins                    Danielle Dess, PTA 05/29/20, 10:17 AM

## 2020-05-30 LAB — CREATININE, SERUM
Creatinine, Ser: 1.14 mg/dL (ref 0.61–1.24)
GFR calc Af Amer: >60 mL/min (ref >60)
GFR calc non Af Amer: >60 mL/min (ref >60)

## 2020-05-30 NOTE — NC FL2 (Signed)
[place] LEVEL OF CARE SCREENING TOOL     IDENTIFICATION  Patient Name: Roy [name]. Birthdate: 15-Mar-1966 Sex: male Admission Date (Current Location): 05/26/2020  [place] and [place] Number:  Engineering geologist and Address:  [facility], [address], [place], [place] [address]      Provider Number: [id number]  Attending Physician Name and Address:  Hessie Knows, MD  Relative Name and Phone Number:  Lexine Baton [phone]    Current Level of Care: Hospital Recommended Level of Care: [place] Prior Approval Number:    Date Approved/Denied:   PASRR Number: [id number]  Discharge Plan: SNF    Current Diagnoses: Patient Active Problem List   Diagnosis Date Noted  . Septic hip ([place]) 05/26/2020  . Bipolar disorder (manic depression) ([place]) 05/18/2020  . Adjustment disorder with mixed anxiety and depressed mood 05/18/2020  . Arthritis pain, hip 05/18/2020  . 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 Angioedema 07/24/2019  . MDD (major depressive disorder), recurrent episode ([place]) 03/23/2019  . MDD (major depressive disorder), recurrent episode, moderate ([place]) 02/19/2019  . MDD (major depressive disorder), recurrent severe, without psychosis ([place]) 02/19/2019  . Chest pain 05/27/2018  . Major depressive disorder, recurrent severe without psychotic features ([place]) 05/27/2018  . ARF (acute renal failure) ([place]) 04/08/2018  . Malingering 03/07/2017  . Cocaine dependence ([place]) 02/19/2017  . Substance induced mood disorder ([place]) 02/19/2017  . HIV disease ([place]) 10/26/2016  . HTN (hypertension) 10/26/2016  . Dyslipidemia 10/26/2016  . BPH (benign prostatic hyperplasia) 10/26/2016  . Constipation 10/26/2016    Orientation RESPIRATION BLADDER Height & Weight     Self, Time, Situation, Place  Normal Continent Weight: 84.8 kg Height:  5\' 9"  (175.3 cm)  BEHAVIORAL SYMPTOMS/MOOD  NEUROLOGICAL BOWEL NUTRITION STATUS      Continent Diet (Dsy 3 diet thin liquids)  AMBULATORY STATUS COMMUNICATION OF NEEDS Skin   Limited Assist Verbally Surgical wounds                       Personal Care Assistance Level of Assistance  Bathing, Dressing Bathing Assistance: Limited assistance   Dressing Assistance: Limited assistance     Functional Limitations Info             SPECIAL CARE FACTORS FREQUENCY  PT (By licensed PT) (IV ABX long term)     PT Frequency: 5 times per week              Contractures Contractures Info: Not present    Additional Factors Info  Code Status, Allergies Code Status Info: Full code Allergies Info: Amlodipine, Lisinopril, Lactose, Pollen Extract           Current Medications (05/30/2020):  This is the current hospital active medication list Current Facility-Administered Medications  Medication Dose Route Frequency Provider Last Rate Last Admin  . 0.9 %  sodium chloride infusion   Intravenous Continuous Hessie Knows, MD 10 mL/hr at 05/28/20 0347 Rate Verify at 05/28/20 0347  . acetaminophen (TYLENOL) tablet 325-650 mg  325-650 mg Oral Q6H PRN Hessie Knows, MD      . albuterol (PROVENTIL) (2.5 MG/3ML) 0.083% nebulizer solution 2.5 mg  2.5 mg Inhalation Q4H PRN Hessie Knows, MD      . atorvastatin (LIPITOR) tablet 10 mg  10 mg Oral q1800 Hessie Knows, MD   10 mg at 05/29/20 1730  . bictegravir-emtricitabine-tenofovir AF (BIKTARVY) 50-200-25 MG per tablet 1 tablet  1 tablet  Oral Daily Kennedy Bucker, MD   1 tablet at 05/30/20 [phone]  . bisacodyl (DULCOLAX) suppository 10 mg  10 mg Rectal Daily PRN Kennedy Bucker, MD      . cefTRIAXone (ROCEPHIN) 2 g in sodium chloride 0.9 % 100 mL IVPB  2 g Intravenous Q24H Lynn Ito, MD 200 mL/hr at 05/29/20 2235 2 g at 05/29/20 2235  . Chlorhexidine Gluconate Cloth 2 % PADS 6 each  6 each Topical Daily Kennedy Bucker, MD   6 each at 05/30/20 [phone]  . citalopram (CELEXA) tablet 40 mg   40 mg Oral Daily Kennedy Bucker, MD   40 mg at 05/30/20 0917  . docusate sodium (COLACE) capsule 100 mg  100 mg Oral BID Kennedy Bucker, MD   100 mg at 05/30/20 0917  . enoxaparin (LOVENOX) injection 40 mg  40 mg Subcutaneous Q24H Kennedy Bucker, MD   40 mg at 05/30/20 0917  . feeding supplement (ENSURE ENLIVE) (ENSURE ENLIVE) liquid 237 mL  237 mL Oral BID BM Kennedy Bucker, MD   237 mL at 05/30/20 0918  . gabapentin (NEURONTIN) capsule 300 mg  300 mg Oral BID Kennedy Bucker, MD   300 mg at 05/30/20 0917  . hydrochlorothiazide (MICROZIDE) capsule 12.5 mg  12.5 mg Oral Daily Kennedy Bucker, MD   12.5 mg at 05/30/20 0917  . HYDROmorphone (DILAUDID) injection 0.5-1 mg  0.5-1 mg Intravenous Q4H PRN Kennedy Bucker, MD      . hydrOXYzine (ATARAX/VISTARIL) tablet 10 mg  10 mg Oral TID PRN Kennedy Bucker, MD      . magnesium citrate solution 1 Bottle  1 Bottle Oral Once PRN Kennedy Bucker, MD      . magnesium hydroxide (MILK OF MAGNESIA) suspension 30 mL  30 mL Oral Daily PRN Kennedy Bucker, MD   30 mL at 05/27/20 1657  . menthol-cetylpyridinium (CEPACOL) lozenge 3 mg  1 lozenge Oral PRN Kennedy Bucker, MD       Or  . phenol (CHLORASEPTIC) mouth spray 1 spray  1 spray Mouth/Throat PRN Kennedy Bucker, MD      . methocarbamol (ROBAXIN) tablet 500 mg  500 mg Oral Q6H PRN Kennedy Bucker, MD   500 mg at 05/27/20 2105   Or  . methocarbamol (ROBAXIN) 500 mg in dextrose 5 % 50 mL IVPB  500 mg Intravenous Q6H PRN Kennedy Bucker, MD      . metoCLOPramide (REGLAN) tablet 5-10 mg  5-10 mg Oral Q8H PRN Kennedy Bucker, MD       Or  . metoCLOPramide (REGLAN) injection 5-10 mg  5-10 mg Intravenous Q8H PRN Kennedy Bucker, MD      . ondansetron [facility]) tablet 4 mg  4 mg Oral Q6H PRN Kennedy Bucker, MD       Or  . ondansetron [facility]) injection 4 mg  4 mg Intravenous Q6H PRN Kennedy Bucker, MD      . oxyCODONE (Oxy IR/ROXICODONE) immediate release tablet 10-15 mg  10-15 mg Oral Q4H PRN Kennedy Bucker, MD      . oxyCODONE (Oxy  IR/ROXICODONE) immediate release tablet 5-10 mg  5-10 mg Oral Q4H PRN Kennedy Bucker, MD   5 mg at 05/30/20 0926  . traMADol (ULTRAM) tablet 50 mg  50 mg Oral Q6H Kennedy Bucker, MD   50 mg at 05/30/20 0546  . traZODone (DESYREL) tablet 300 mg  300 mg Oral QHS PRN Kennedy Bucker, MD   300 mg at 05/27/20 0120  . vancomycin (VANCOCIN) IVPB 1000 mg/200 mL premix  1,000  mg Intravenous Q12H Mila Merry A, RPH 200 mL/hr at 05/29/20 2107 1,000 mg at 05/29/20 2107  . zolpidem (AMBIEN) tablet 5 mg  5 mg Oral QHS PRN,MR X 1 Kennedy Bucker, MD   5 mg at 05/28/20 2257     Discharge Medications: Please see discharge summary for a list of discharge medications.  Relevant Imaging Results:  Relevant Lab Results:   Additional Information SS# [id number]  Barrie Dunker, RN

## 2020-05-30 NOTE — Progress Notes (Signed)
ID Patient has no complaints No fever Pain right hip better  Patient Vitals for the past 24 hrs:  BP Temp Temp src Pulse Resp SpO2  05/30/20 0720 119/76 98.6 F (37 C) Oral (!) 53 14 97 %  05/29/20 2345 120/80 98.2 F (36.8 C) Oral (!) 51 18 98 %  05/29/20 1532 109/77 98.7 F (37.1 C) Oral (!) 56 14 99 %  On examination awake and alert Right femoral area covered with wound VAC Chest bilateral air entry Edentulous   CBC Latest Ref Rng & Units 05/28/2020 05/27/2020 05/26/2020  WBC 4.0 - 10.5 K/uL 8.3 10.3 13.4(H)  Hemoglobin 13.0 - 17.0 g/dL 10.8(L) 11.5(L) 12.6(L)  Hematocrit 39 - 52 % 31.6(L) 33.2(L) 38.0(L)  Platelets 150 - 400 K/uL 213 252 237     CMP Latest Ref Rng & Units 05/30/2020 05/29/2020 05/27/2020  Glucose 70 - 99 mg/dL - - 425(Z)  BUN 6 - 20 mg/dL - - 16  Creatinine 5.63 - 1.24 mg/dL 8.75 6.43 3.29  Sodium 135 - 145 mmol/L - - 135  Potassium 3.5 - 5.1 mmol/L - - 5.0  Chloride 98 - 111 mmol/L - - 102  CO2 22 - 32 mmol/L - - 26  Calcium 8.9 - 10.3 mg/dL - - 8.5(L)  Total Protein 6.5 - 8.1 g/dL - - -  Total Bilirubin 0.3 - 1.2 mg/dL - - -  Alkaline Phos 38 - 126 U/L - - -  AST 15 - 41 U/L - - -  ALT 0 - 44 U/L - - -     Impression/recommendation 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  Wonder whether we need to look for endocarditis even though from a management perspective it may not change much.  He will need minimum 6 weeks of IV antibiotics and then p.o. antibiotics for a total of 3 months.  HIV disease on Biktarvy. History of treated hepatitis C  History of drug use.  Denies IV drug use.  Him being homeless and with history of drug use is not a candidate to be sent on PICC line.  Bipolar disorder On citalopram Discussed the management with the patient in  detail.

## 2020-05-30 NOTE — Progress Notes (Signed)
Physical Therapy Treatment Patient Details Name: Roy Gritton [facility] Sr. MRN: [id number] DOB: December 07, 1966 Today's Date: 05/30/2020    History of Present Illness Pt admitted for septic R hip and is s/p I&D with exchange of femoral head and bead placement on 05/26/20. History includes previous R THR (2020), bipolar, HIV, MDD, cocaine, and HTN     PT Comments    OOB and completes 4 laps around unit with RW and min guard/supervision.  Remains standing upon return to bed for standing ex.  Progressing well with goals.  SNF appropriate for safe discharge plan per SWS/MD.   Follow Up Recommendations  SNF     Equipment Recommendations  Rolling walker with 5" wheels    Recommendations for Other Services       Precautions / Restrictions Precautions Precautions: Fall Restrictions Weight Bearing Restrictions: Yes RLE Weight Bearing: Weight bearing as tolerated    Mobility  Bed Mobility Overal bed mobility: Modified Independent Bed Mobility: Supine to Sit;Sit to Supine     Supine to sit: Modified independent (Device/Increase time) Sit to supine: Modified independent (Device/Increase time)   General bed mobility comments: self assist of LE  Transfers Overall transfer level: Needs assistance Equipment used: Rolling walker (2 wheeled) Transfers: Sit to/from Stand Sit to Stand: Min guard Stand pivot transfers: Min guard;From elevated surface          Ambulation/Gait Ambulation/Gait assistance: Min guard Gait Distance (Feet): 700 Feet Assistive device: Rolling walker (2 wheeled) Gait Pattern/deviations: Step-to pattern Gait velocity: decreased   General Gait Details: continues with less reliance on walker.  4 laps today   Stairs             Wheelchair Mobility    Modified Rankin (Stroke Patients Only)       Balance Overall balance assessment: Modified Independent;Needs assistance Sitting-balance support: Feet supported Sitting balance-Leahy Scale: Good      Standing balance support: Bilateral upper extremity supported Standing balance-Leahy Scale: Good Standing balance comment: less UE assist for gait today                            Cognition Arousal/Alertness: Awake/alert Behavior During Therapy: WFL for tasks assessed/performed Overall Cognitive Status: Within Functional Limits for tasks assessed                                        Exercises Other Exercises Other Exercises: standing x 10 with walker for heel raises, SLR and marches BLE    General Comments        Pertinent Vitals/Pain Pain Assessment: 0-10 Pain Score: 1  Pain Location: feels stictches pulling but no pain in joint Pain Descriptors / Indicators: Discomfort Pain Intervention(s): Limited activity within patient's tolerance;Monitored during session    Home Living                      Prior Function            PT Goals (current goals can now be found in the care plan section) Progress towards PT goals: Progressing toward goals    Frequency    7X/week      PT Plan      Co-evaluation              AM-PAC PT "6 Clicks" Mobility   Outcome Measure  Help needed turning from your  back to your side while in a flat bed without using bedrails?: None Help needed moving from lying on your back to sitting on the side of a flat bed without using bedrails?: None Help needed moving to and from a bed to a chair (including a wheelchair)?: A Little Help needed standing up from a chair using your arms (e.g., wheelchair or bedside chair)?: None Help needed to walk in hospital room?: A Little Help needed climbing 3-5 steps with a railing? : A Little 6 Click Score: 21    End of Session Equipment Utilized During Treatment: Gait belt Activity Tolerance: Patient tolerated treatment well Patient left: in bed;with bed alarm set Nurse Communication: Mobility status PT Visit Diagnosis: Muscle weakness (generalized)  (M62.81);Difficulty in walking, not elsewhere classified (R26.2);Pain Pain - Right/Left: Right Pain - part of body: Hip     Time: 3016-0109 PT Time Calculation (min) (ACUTE ONLY): 16 min  Charges:  $Gait Training: 8-22 mins                     Roy [name], PTA 05/30/20, 10:51 AM

## 2020-05-30 NOTE — TOC Initial Note (Signed)
Transition of Care (TOC) - Initial/Assessment Note    Patient Details  Name: Roy [facility] Sr. MRN: [id number] Date of Birth: 1966/07/02  Transition of Care [facility]) CM/SW Contact:    Barrie Dunker, RN Phone Number: 05/30/2020, 10:15 AM  Clinical Narrative:                 Patient is homeless and has a drug use history, unable to DC with a PICC line, will need long term ABX for septic Hip, FL2, bed search completed, will be hard to place due to insurance, drug history and no disposition to a home after DC, awaiting bed offers        Patient Goals and CMS Choice        Expected Discharge Plan and Services                                                Prior Living Arrangements/Services                       Activities of Daily Living Home Assistive Devices/Equipment: None ADL Screening (condition at time of admission) [name] cognitive ability adequate to safely complete daily activities?: Yes Is the patient deaf or have difficulty hearing?: No Does the patient have difficulty seeing, even when wearing glasses/contacts?: No Does the patient have difficulty concentrating, remembering, or making decisions?: No Patient able to express need for assistance with ADLs?: Yes Does the patient have difficulty dressing or bathing?: No Independently performs ADLs?: Yes (appropriate for developmental age) Does the patient have difficulty walking or climbing stairs?: Yes Weakness of Legs: Right Weakness of Arms/Hands: Left  Permission Sought/Granted                  Emotional Assessment              Admission diagnosis:  Septic hip [facility]) [M00.9] Patient Active Problem List   Diagnosis Date Noted  . 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  . Bipolar disorder (manic depression) (HCC) 05/18/2020  . Adjustment disorder with mixed anxiety and depressed mood 05/18/2020  . Arthritis pain, hip 05/18/2020  . Status post total hip replacement, right  09/01/2019  . Angioedema 07/24/2019  . MDD (major depressive disorder), recurrent episode (HCC) 03/23/2019  . MDD (major depressive disorder), recurrent episode, moderate (HCC) 02/19/2019  . MDD (major depressive disorder), recurrent severe, without psychosis (HCC) 02/19/2019  . Chest pain 05/27/2018  . Major depressive disorder, recurrent severe without psychotic features (HCC) 05/27/2018  . ARF (acute renal failure) (HCC) 04/08/2018  . Malingering 03/07/2017  . Cocaine dependence (HCC) 02/19/2017  . Substance induced mood disorder (HCC) 02/19/2017  . HIV disease (HCC) 10/26/2016  . HTN (hypertension) 10/26/2016  . Dyslipidemia 10/26/2016  . BPH (benign prostatic hyperplasia) 10/26/2016  . Constipation 10/26/2016   PCP:  Patient, No Pcp Per Pharmacy:   [facility] - [place], [place] - [address] [place] [address] Phone: [phone] Fax: [phone]  [id number] - [place], [place] - [address] [place] [address] Phone: [phone] Fax: [phone]     Social Determinants of Health (SDOH) Interventions    Readmission Risk Interventions No flowsheet data found.

## 2020-05-30 NOTE — Progress Notes (Signed)
   Subjective: 4 Days Post-Op Procedure(s) (LRB): RIGHT HIP IRRIGATION AND DEBRIDEMENT, WITH EXCHANGE OF FEMORAL HEAD COMPONENT AND ANTIBIOTIC BEADS, VERSAJET (Right) Patient reports pain as mild.   Patient is well, and has had no acute complaints or problems Denies any CP, SOB, ABD pain. We will continue therapy today.   Objective: Vital signs in last 24 hours: Temp:  [98.2 F (36.8 C)-98.7 F (37.1 C)] 98.6 F (37 C) (06/14 0720) Pulse Rate:  [51-56] 53 (06/14 0720) Resp:  [14-18] 14 (06/14 0720) BP: (109-120)/(76-80) 119/76 (06/14 0720) SpO2:  [97 %-99 %] 97 % (06/14 0720)  Intake/Output from previous day: 06/13 0701 - 06/14 0700 In: 440 [P.O.:240; IV Piggyback:200] Out: 2575 [Urine:2575] Intake/Output this shift: Total I/O In: -  Out: 350 [Urine:350]  Recent Labs    05/28/20 0522  HGB 10.8*   Recent Labs    05/28/20 0522  WBC 8.3  RBC 3.29*  HCT 31.6*  PLT 213   Recent Labs    05/29/20 0922 05/30/20 0612  CREATININE 1.08 1.14   No results for input(s): LABPT, INR in the last 72 hours.  EXAM General - Patient is Alert, Appropriate and Oriented Extremity - Neurovascular intact Sensation intact distally Intact pulses distally Dorsiflexion/Plantar flexion intact No cellulitis present Compartment soft Dressing - dressing C/D/I, Praveena intact with total of 150cc of bloody drainage, stable Motor Function - intact, moving foot and toes well on exam.    Past Medical History:  Diagnosis Date  . AIDS (acquired immune deficiency syndrome) (HCC)   . Arthritis   . Asthma   . Bipolar disorder (HCC)   . Bronchitis   . Complication of anesthesia   . Depression   . Dysrhythmia    1st degree heart block/ brady  . GERD (gastroesophageal reflux disease)   . Hepatitis C    treated  . HIV (human immunodeficiency virus infection) (HCC)   . HTN (hypertension)   . PONV (postoperative nausea and vomiting)     Assessment/Plan:   4 Days Post-Op Procedure(s)  (LRB): RIGHT HIP IRRIGATION AND DEBRIDEMENT, WITH EXCHANGE OF FEMORAL HEAD COMPONENT AND ANTIBIOTIC BEADS, VERSAJET (Right) Active Problems:   Septic hip (HCC)  Estimated body mass index is 27.61 kg/m as calculated from the following:   Height as of this encounter: 5\' 9"  (1.753 m).   Weight as of this encounter: 84.8 kg. Advance diet Up with therapy  Continue with IV antibiotics per infectious disease.  Currently on vancomycin and ceftriaxone.  Appreciate infectious disease input. Intraoperative cultures pending Vital signs are stable Pain well controlled Care management to assist with discharge to skilled nursing facility  DVT Prophylaxis - Lovenox and SCDs Weight-Bearing as tolerated to right leg  T. , PA-C [facility] 05/30/2020, 8:06 AM

## 2020-05-30 NOTE — TOC Progression Note (Signed)
Transition of Care (TOC) - Progression Note    Patient Details  Name: [name] MRN: [id number] Date of Birth: 11/11/1966  Transition of Care (TOC) CM/SW Contact   J , RN Phone Number: 05/30/2020, 3:45 PM  Clinical Narrative:    Went into the room and met with the patient and his Fiance at the bedside, I reviewed the 3 bed offers that are in place  I explained that the patient will need to sign over his Check for the amount of time er is there in 30 day increments, He stated that he will not be willing to do that, his fiance is working on getting him a  Place to live while he is in the hospital because otherwise he is homeless.  He refuses to sign over his check to the facility because he needs it to get a house.  I explained that would mean not getting a facility and he stated oh well, he stated that he cant leave the hospital anyway due to his inability to walk well and the need for IV ABX, I notified the physician         Expected Discharge Plan and Services                                                 Social Determinants of Health (SDOH) Interventions    Readmission Risk Interventions No flowsheet data found.  

## 2020-05-31 NOTE — TOC Progression Note (Signed)
Transition of Care (TOC) - Progression Note    Patient Details  Name: Roy Kay Soroka Sr. MRN: [id number] Date of Birth: 02/27/1966  Transition of Care (TOC) CM/SW Contact   J , RN Phone Number: 05/31/2020, 1:38 PM  Clinical Narrative:    Met with the patient to discuss DC plan and needs again He still refuses to sign over his check to go to SNF, He said his fiance has called and told him that she has found him a place to live and he needs the check for that He said he is walking better and walked in the room today without a walker,  I reached out to Dr Ravishankar about doing weekly ABX in outpatient clinic, she will let me know if he qualifies for that Awaiting to hear back about that        Expected Discharge Plan and Services                                                 Social Determinants of Health (SDOH) Interventions    Readmission Risk Interventions No flowsheet data found.  

## 2020-05-31 NOTE — Progress Notes (Signed)
Physical Therapy Treatment Patient Details Name: Joshuah Minella [facility] Sr. MRN: [id number] DOB: 1966/08/30 Today's Date: 05/31/2020    History of Present Illness Pt admitted for septic R hip and is s/p I&D with exchange of femoral head and bead placement on 05/26/20. History includes previous R THR (2020), bipolar, HIV, MDD, cocaine, and HTN     PT Comments    Pt was supine in bed with HOB elevated ~ 30 degrees. He agrees to PT session and is cooperative throughout.  No physical assistance required to get out/in bed. Sat EOB x several minutes prior to standing to RW and ambulating 500 ft. Pt was  able to ambulate 1 x 25 ft without use of AD with CGA for safety. Unsteadiness noted with gait training without AD. Once returned to room, therapist reviewed exercises and pt states he has been performing. Pt does not have a home to safely DC to. He will benefit from continued skilled PT at DC to continue progressing towards PLOF. He was supine in bed at conclusion of session with call bell in reach and bed alarm in place.     Follow Up Recommendations  SNF     Equipment Recommendations  Rolling walker with 5" wheels    Recommendations for Other Services       Precautions / Restrictions Precautions Precautions: Fall Restrictions Weight Bearing Restrictions: Yes RLE Weight Bearing: Weight bearing as tolerated    Mobility  Bed Mobility Overal bed mobility: Modified Independent Bed Mobility: Supine to Sit;Sit to Supine     Supine to sit: Modified independent (Device/Increase time) Sit to supine: Modified independent (Device/Increase time)      Transfers Overall transfer level: Needs assistance Equipment used: Rolling walker (2 wheeled) Transfers: Sit to/from Stand Sit to Stand: Supervision         General transfer comment: Pt demonstrated safe ability to STS from EOB to RW  Ambulation/Gait Ambulation/Gait assistance: Supervision;Min guard Gait Distance (Feet): 500  Feet Assistive device: Rolling walker (2 wheeled);None Gait Pattern/deviations: Step-through pattern Gait velocity: decreased   General Gait Details: Pt ambulated 500 ft with RW without LOB or unsteadiness. He requested trial of ambulation without AD. was able to ambulate to doorway of roomand back to bed~ 25 ft with CGA for safety. therapist discussed importance of use of AD when mobilizing with RN staff. pt educated on continued trial without AD during PT sessions.   Stairs             Wheelchair Mobility    Modified Rankin (Stroke Patients Only)       Balance Overall balance assessment: Modified Independent;Needs assistance Sitting-balance support: Feet supported Sitting balance-Leahy Scale: Good     Standing balance support: Bilateral upper extremity supported Standing balance-Leahy Scale: Good Standing balance comment: fair balance without use of AD                            Cognition Arousal/Alertness: Awake/alert Behavior During Therapy: WFL for tasks assessed/performed Overall Cognitive Status: Within Functional Limits for tasks assessed                                 General Comments: Pt is A and O x4. Agreeable to PT session and motivated to participate.      Exercises      General Comments        Pertinent Vitals/Pain Pain Assessment: No/denies  pain Pain Score: 0-No pain Pain Descriptors / Indicators: Tightness Pain Intervention(s): Limited activity within patient's tolerance;Monitored during session;Premedicated before session;Repositioned    Home Living                      Prior Function            PT Goals (current goals can now be found in the care plan section) Acute Rehab PT Goals Patient Stated Goal: To return to PLOF  Progress towards PT goals: Progressing toward goals    Frequency    7X/week      PT Plan Current plan remains appropriate    Co-evaluation              AM-PAC PT  "6 Clicks" Mobility   Outcome Measure  Help needed turning from your back to your side while in a flat bed without using bedrails?: None Help needed moving from lying on your back to sitting on the side of a flat bed without using bedrails?: None Help needed moving to and from a bed to a chair (including a wheelchair)?: A Little Help needed standing up from a chair using your arms (e.g., wheelchair or bedside chair)?: None Help needed to walk in hospital room?: A Little Help needed climbing 3-5 steps with a railing? : A Little 6 Click Score: 21    End of Session Equipment Utilized During Treatment: Gait belt Activity Tolerance: Patient tolerated treatment well Patient left: in bed;with bed alarm set Nurse Communication: Mobility status PT Visit Diagnosis: Muscle weakness (generalized) (M62.81);Difficulty in walking, not elsewhere classified (R26.2);Pain Pain - Right/Left: Right Pain - part of body: Hip     Time: 9622-2979 PT Time Calculation (min) (ACUTE ONLY): 18 min  Charges:  $Gait Training: 8-22 mins                     Julaine Fusi PTA 05/31/20, 9:55 AM

## 2020-05-31 NOTE — Treatment Plan (Signed)
Diagnosis: Rt hip  PJI with streptococcus mitis  Baseline Creatinine 1.14    Allergies  Allergen Reactions  . Amlodipine Swelling    Of the tongue  . Lisinopril Swelling  . Lactose Other (See Comments)    GI distress  . Pollen Extract Other (See Comments)    Itchy eyes and runny nose    OPAT Orders Discharge antibiotics: Dalbavancin 1045m IV infusion  1st dose in the day surgery center on 06/02/20  followed by 5072m every week for 5 doses thru peripheral line End date 07/14/20 Labs weekly while on IV antibiotics: _X_ CBC with differential  _X_ CMP _X_ CRP _X_ ESR    Clinic Follow Up Appt:with Dr.Kadar Chance in 4 weeks   Call [phone] make appt

## 2020-05-31 NOTE — Progress Notes (Signed)
   Subjective: 5 Days Post-Op Procedure(s) (LRB): RIGHT HIP IRRIGATION AND DEBRIDEMENT, WITH EXCHANGE OF FEMORAL HEAD COMPONENT AND ANTIBIOTIC BEADS, VERSAJET (Right) Patient reports pain as mild.   Patient is well, and has had no acute complaints or problems Denies any CP, SOB, ABD pain. We will continue therapy today.   Objective: Vital signs in last 24 hours: Temp:  [97.6 F (36.4 C)-98.2 F (36.8 C)] 98.2 F (36.8 C) (06/15 0713) Pulse Rate:  [50-54] 50 (06/15 0713) Resp:  [15-20] 18 (06/15 0713) BP: (113-131)/(71-84) 113/75 (06/15 0713) SpO2:  [98 %-100 %] 100 % (06/15 0713)  Intake/Output from previous day: 06/14 0701 - 06/15 0700 In: 2950 [P.O.:2850; IV Piggyback:100] Out: 3325 [Urine:3325] Intake/Output this shift: No intake/output data recorded.  No results for input(s): HGB in the last 72 hours. No results for input(s): WBC, RBC, HCT, PLT in the last 72 hours. Recent Labs    05/29/20 0922 05/30/20 0612  CREATININE 1.08 1.14   No results for input(s): LABPT, INR in the last 72 hours.  EXAM General - Patient is Alert, Appropriate and Oriented Extremity - Neurovascular intact Sensation intact distally Intact pulses distally Dorsiflexion/Plantar flexion intact No cellulitis present Compartment soft Dressing - dressing C/D/I, Praveena intact with total of 250cc of bloody drainage, stable Motor Function - intact, moving foot and toes well on exam.    Past Medical History:  Diagnosis Date  . AIDS (acquired immune deficiency syndrome) (HCC)   . Arthritis   . Asthma   . Bipolar disorder (HCC)   . Bronchitis   . Complication of anesthesia   . Depression   . Dysrhythmia    1st degree heart block/ brady  . GERD (gastroesophageal reflux disease)   . Hepatitis C    treated  . HIV (human immunodeficiency virus infection) (HCC)   . HTN (hypertension)   . PONV (postoperative nausea and vomiting)     Assessment/Plan:   5 Days Post-Op Procedure(s)  (LRB): RIGHT HIP IRRIGATION AND DEBRIDEMENT, WITH EXCHANGE OF FEMORAL HEAD COMPONENT AND ANTIBIOTIC BEADS, VERSAJET (Right) Active Problems:   Septic hip (HCC)  Estimated body mass index is 27.61 kg/m as calculated from the following:   Height as of this encounter: 5\' 9"  (1.753 m).   Weight as of this encounter: 84.8 kg. Advance diet Up with therapy  Continue with IV antibiotics per infectious disease.  Currently on ceftriaxone.  Culture showed rare strep salivarius/oralis/mitis. appreciate infectious disease input. Vital signs are stable Pain well controlled Care management to assist with discharge to skilled nursing facility  DVT Prophylaxis - Lovenox and SCDs Weight-Bearing as tolerated to right leg  T. , PA-C [facility] 05/31/2020, 8:13 AM

## 2020-05-31 NOTE — TOC Progression Note (Signed)
Transition of Care (TOC) - Progression Note    Patient Details  Name: Roy [name] [facility] Sr. MRN: [id number] Date of Birth: December 09, 1966  Transition of Care [facility]) CM/SW Contact  Barrie Dunker, RN Phone Number: 05/31/2020, 2:10 PM  Clinical Narrative:    Called Dr Rosita Kea office to see if they could find out the copay for IV ABX weekly, they are unable to obtain that information, I called [facility] at [phone] and left a secure voice mail asking them to check the copay for Dalbavancin 500 mg weekly and to call me with the copay information, I notified Dr Rosita Kea and Dr Rivka Safer of this      Expected Discharge Plan and Services                                                 Social Determinants of Health (SDOH) Interventions    Readmission Risk Interventions No flowsheet data found.

## 2020-05-31 NOTE — TOC Progression Note (Signed)
Transition of Care (TOC) - Progression Note    Patient Details  Name: Roy [name] [facility] Sr. MRN: [id number] Date of Birth: [birth year]  Transition of Care [facility]) CM/SW Contact  Barrie Dunker, RN Phone Number: 05/31/2020, 3:28 PM  Clinical Narrative:     Went in to speak with the patient and his fiance at their request, He stated that he now has a home to go to when he discharges, he has to get out and then he will be able to furnish it, He asked me to fill out DMV forms so that he could get a handicap sticker for a car, I asked him if he had a car, he stated that he would use it in any car that he was in, he handed me a blank DMV form, I explained to him that I was not able to sign a blank form, he will need to fill out his portion and then give it to his PCP to fill out, I did not have the scope to say he is Handicapped, he stated understanding        Expected Discharge Plan and Services                                                 Social Determinants of Health (SDOH) Interventions    Readmission Risk Interventions No flowsheet data found.

## 2020-06-01 MED ORDER — ENOXAPARIN SODIUM 40 MG/0.4ML ~~LOC~~ SOLN: 40.0000 mg | SUBCUTANEOUS | 14 refills | Status: DC

## 2020-06-01 MED ORDER — TRAMADOL HCL 50 MG PO TABS: 50.0000 mg | ORAL_TABLET | Freq: Four times a day (QID) | ORAL | 0 refills | Status: DC | PRN

## 2020-06-01 NOTE — TOC Progression Note (Signed)
Transition of Care (TOC) - Progression Note    Patient Details  Name: Roy Isbell [facility] Sr. MRN: [id number] Date of Birth: [birth year]  Transition of Care [facility]) CM/SW Contact  Barrie Dunker, RN Phone Number: 06/01/2020, 4:01 PM  Clinical Narrative:     Roy [name] with Roy [name] art the Same day surgery and asked about the IV Infusion for a weekly dosing, She looked up the patient and found the medication, She stated she would make a few calls and call me back, The plan is to start the first dose on 6/17 at 1000 mg and continue each week for 5 weeks at 500 MG weekly, awaiting a call back       Expected Discharge Plan and Services                                                 Social Determinants of Health (SDOH) Interventions    Readmission Risk Interventions No flowsheet data found.

## 2020-06-01 NOTE — TOC Progression Note (Signed)
Transition of Care (TOC) - Progression Note    Patient Details  Name: Roy Osgood [facility] Sr. MRN: [id number] Date of Birth: [birth year]  Transition of Care [facility]) CM/SW Contact  Barrie Dunker, RN Phone Number: 06/01/2020, 11:01 AM  Clinical Narrative:    The patient called me this morning asking If I could get him a bed for his new home, I asked if he meant a hospital bed, he said no a regular bed, I explained that I do not have resources to get that however encouraged him to call or have his fiance call  [employer] or Holiday representative or churches in the community to get help with [employer], He stated that he would, He said he asked the Dr to give him another day in the hospital to get everything lined up        Expected Discharge Plan and Services                                                 Social Determinants of Health (SDOH) Interventions    Readmission Risk Interventions No flowsheet data found.

## 2020-06-01 NOTE — Progress Notes (Signed)
Medicaid called to assess if Dalbavancin is covered for outpatient IV administration.  It is not  a preferred antibiotic for Medicaid. I was told that a Prior Auth. Form will need to be filled out by Dr. Rivka Safer. This form can be accessed at [email or web address] Or call [phone]. My call with Dorathy Daft can be referrenced by # [id number]. TOC, Pharmacy and Dr. Rivka Safer notified.

## 2020-06-01 NOTE — Progress Notes (Addendum)
Physical Therapy Treatment Patient Details Name: Roy [name] [facility] Sr. MRN: [id number] DOB: 01/28/1966 Today's Date: 06/01/2020    History of Present Illness Pt admitted for septic R hip and is s/p I&D with exchange of femoral head and bead placement on 05/26/20. History includes previous R THR (2020), bipolar, HIV, MDD, cocaine, and HTN     PT Comments    Pt was seated EOB upon arriving. He agrees to PT session and is cooperative throughout. Pt was able to stand and ambulated 500 ft without AD. No c/o pain or discomfort throughout. He was able to perform stair training without difficulty.NO LOB or unsteadiness noted. Overall pt is progressing towards all PT goals. Therapist changed recommendation from SNF to home with HHPT.      Follow Up Recommendations  Home health PT;Supervision - Intermittent     Equipment Recommendations  None recommended by PT    Recommendations for Other Services       Precautions / Restrictions Precautions Precautions: Fall Restrictions Weight Bearing Restrictions: Yes RLE Weight Bearing: Weight bearing as tolerated    Mobility  Bed Mobility Overal bed mobility: Modified Independent Bed Mobility: Supine to Sit;Sit to Supine     Supine to sit: Modified independent (Device/Increase time) Sit to supine: Modified independent (Device/Increase time)      Transfers Overall transfer level: Needs assistance Equipment used: None Transfers: Sit to/from Stand Sit to Stand: Min guard         General transfer comment: CGA for transfers without AD   Ambulation/Gait Ambulation/Gait assistance: Min guard;Supervision Gait Distance (Feet): 500 Feet Assistive device: None Gait Pattern/deviations: WFL(Within Functional Limits) Gait velocity: decreased   General Gait Details: Pt was able to ambulate this date without AD 500 ft with CGA at first, progressing to supervision only   Stairs Stairs: Yes Stairs assistance: Min guard Stair Management: No  rails;Step to pattern Number of Stairs: 4 General stair comments: pt demonstrated safe ability to ascend/descend FOS without UE support   Wheelchair Mobility    Modified Rankin (Stroke Patients Only)       Balance Overall balance assessment: Modified Independent;Needs assistance         Standing balance support: No upper extremity supported;During functional activity Standing balance-Leahy Scale: Good Standing balance comment: no LOB even without UE support                            Cognition Arousal/Alertness: Awake/alert Behavior During Therapy: WFL for tasks assessed/performed Overall Cognitive Status: Within Functional Limits for tasks assessed                                 General Comments: Pt is A and O x4. Agreeable to PT session and motivated to participate.      Exercises      General Comments        Pertinent Vitals/Pain Pain Assessment: No/denies pain Pain Score: 0-No pain Faces Pain Scale: No hurt Pain Location: feels stictches pulling but no pain in joint Pain Descriptors / Indicators: Tightness;Burning Pain Intervention(s): Limited activity within patient's tolerance;Premedicated before session;Monitored during session    Home Living                      Prior Function            PT Goals (current goals can now be found in the care plan  section) Acute Rehab PT Goals Patient Stated Goal: To return to PLOF     Frequency    7X/week      PT Plan Discharge plan needs to be updated    Co-evaluation              AM-PAC PT "6 Clicks" Mobility   Outcome Measure  Help needed turning from your back to your side while in a flat bed without using bedrails?: None Help needed moving from lying on your back to sitting on the side of a flat bed without using bedrails?: None Help needed moving to and from a bed to a chair (including a wheelchair)?: A Little Help needed standing up from a chair using your  arms (e.g., wheelchair or bedside chair)?: A Little Help needed to walk in hospital room?: A Little Help needed climbing 3-5 steps with a railing? : A Little 6 Click Score: 20    End of Session Equipment Utilized During Treatment: Gait belt Activity Tolerance: Patient tolerated treatment well Patient left: in bed;with bed alarm set Nurse Communication: Mobility status PT Visit Diagnosis: Muscle weakness (generalized) (M62.81);Difficulty in walking, not elsewhere classified (R26.2);Pain Pain - Right/Left: Right Pain - part of body: Hip     Time: 1002-1016 PT Time Calculation (min) (ACUTE ONLY): 14 min  Charges:  $Gait Training: 8-22 mins                     Julaine Fusi PTA 06/01/20, 10:46 AM

## 2020-06-01 NOTE — Progress Notes (Signed)
   Subjective: 6 Days Post-Op Procedure(s) (LRB): RIGHT HIP IRRIGATION AND DEBRIDEMENT, WITH EXCHANGE OF FEMORAL HEAD COMPONENT AND ANTIBIOTIC BEADS, VERSAJET (Right) Patient reports pain as mild.   Patient is well, and has had no acute complaints or problems Denies any CP, SOB, ABD pain. We will continue therapy today.   Objective: Vital signs in last 24 hours: Temp:  [97.9 F (36.6 C)-98.4 F (36.9 C)] 97.9 F (36.6 C) (06/16 0756) Pulse Rate:  [51-55] 51 (06/16 0756) Resp:  [16-18] 16 (06/16 0756) BP: (102-122)/(65-77) 102/72 (06/16 0756) SpO2:  [97 %-100 %] 100 % (06/16 0756)  Intake/Output from previous day: 06/15 0701 - 06/16 0700 In: 1200 [P.O.:1200] Out: 1550 [Urine:1500; Drains:50] Intake/Output this shift: Total I/O In: -  Out: 400 [Urine:400]  No results for input(s): HGB in the last 72 hours. No results for input(s): WBC, RBC, HCT, PLT in the last 72 hours. Recent Labs    05/29/20 0922 05/30/20 0612  CREATININE 1.08 1.14   No results for input(s): LABPT, INR in the last 72 hours.  EXAM General - Patient is Alert, Appropriate and Oriented Extremity - Neurovascular intact Sensation intact distally Intact pulses distally Dorsiflexion/Plantar flexion intact No cellulitis present Compartment soft Dressing - dressing C/D/I, Praveena intact with total of 250cc of bloody drainage, stable Motor Function - intact, moving foot and toes well on exam.    Past Medical History:  Diagnosis Date  . AIDS (acquired immune deficiency syndrome) (HCC)   . Arthritis   . Asthma   . Bipolar disorder (HCC)   . Bronchitis   . Complication of anesthesia   . Depression   . Dysrhythmia    1st degree heart block/ brady  . GERD (gastroesophageal reflux disease)   . Hepatitis C    treated  . HIV (human immunodeficiency virus infection) (HCC)   . HTN (hypertension)   . PONV (postoperative nausea and vomiting)     Assessment/Plan:   6 Days Post-Op Procedure(s)  (LRB): RIGHT HIP IRRIGATION AND DEBRIDEMENT, WITH EXCHANGE OF FEMORAL HEAD COMPONENT AND ANTIBIOTIC BEADS, VERSAJET (Right) Active Problems:   Septic hip (HCC)  Estimated body mass index is 27.61 kg/m as calculated from the following:   Height as of this encounter: 5\' 9"  (1.753 m).   Weight as of this encounter: 84.8 kg. Advance diet Up with therapy  Continue with IV antibiotics per infectious disease.  Currently on Dalbavancin.  Culture showed rare strep salivarius/oralis/mitis. appreciate infectious disease input. Vital signs are stable Pain well controlled Care management to assist with discharge. Patient states he has found a boarding house, wanting to discuss with CM  DVT Prophylaxis - Lovenox and SCDs Weight-Bearing as tolerated to right leg  T. , PA-C [facility] 06/01/2020, 8:00 AM

## 2020-06-01 NOTE — Progress Notes (Signed)
ID No complaints except he had a visitor that was not supposed to be in his room Pain better  Patient Vitals for the past 24 hrs:  BP Temp Temp src Pulse Resp SpO2  06/01/20 0756 102/72 97.9 F (36.6 C) Oral (!) 51 16 100 %  05/31/20 2352 110/65 98.4 F (36.9 C) Oral (!) 51 18 97 %  05/31/20 1601 114/72 98.2 F (36.8 C) Oral (!) 55 16 100 %  awake and alert Chest b/l air entry HSs1s Abd soft Rt femoral vac dressing   CBC Latest Ref Rng & Units 05/28/2020 05/27/2020 05/26/2020  WBC 4.0 - 10.5 K/uL 8.3 10.3 13.4(H)  Hemoglobin 13.0 - 17.0 g/dL 10.8(L) 11.5(L) 12.6(L)  Hematocrit 39 - 52 % 31.6(L) 33.2(L) 38.0(L)  Platelets 150 - 400 K/uL 213 252 237    CMP Latest Ref Rng & Units 05/30/2020 05/29/2020 05/27/2020  Glucose 70 - 99 mg/dL - - 629(B)  BUN 6 - 20 mg/dL - - 16  Creatinine 2.84 - 1.24 mg/dL 1.32 4.40 1.02  Sodium 135 - 145 mmol/L - - 135  Potassium 3.5 - 5.1 mmol/L - - 5.0  Chloride 98 - 111 mmol/L - - 102  CO2 22 - 32 mmol/L - - 26  Calcium 8.9 - 10.3 mg/dL - - 8.5(L)  Total Protein 6.5 - 8.1 g/dL - - -  Total Bilirubin 0.3 - 1.2 mg/dL - - -  Alkaline Phos 38 - 126 U/L - - -  AST 15 - 41 U/L - - -  ALT 0 - 44 U/L - - -    Impression/recommendation 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    He will need minimum 6 weeks of IV antibiotics and then p.o. antibiotics for a total of 3 months. As he uses illegal drugs cannot send him with PICC Aslo he said he was homeless but his girlfriend has found a place which is not set up He does not want to go to SNF HE will get long acting glycpeptide like dalbvancin or oritavancin once a week for 6 weeks  HIV disease on Biktarvy. History of treated hepatitis C    Bipolar disorder On citalopram  Discussed the management with  the patient and care team Will follow him as OP- To call [phone] for appt 3 weeks

## 2020-06-02 LAB — CREATININE, SERUM
Creatinine, Ser: 1.06 mg/dL (ref 0.61–1.24)
GFR calc Af Amer: >60 mL/min (ref >60)
GFR calc non Af Amer: >60 mL/min (ref >60)

## 2020-06-02 LAB — C-REACTIVE PROTEIN: CRP: 1.1 mg/dL — ABNORMAL HIGH (ref <1.0)

## 2020-06-02 LAB — SEDIMENTATION RATE: Sed Rate: 40 mm/hr — ABNORMAL HIGH (ref 0–20)

## 2020-06-02 MED ORDER — ENSURE MAX PROTEIN PO LIQD
11.0000 [oz_av] | Freq: Every day | ORAL | Status: DC
  Filled 2020-06-02: qty 330

## 2020-06-02 MED ORDER — DALBAVANCIN HCL 500 MG IV SOLR: 500.0000 mg | INTRAVENOUS | 0 refills | Status: AC

## 2020-06-02 NOTE — Discharge Summary (Signed)
Physician Discharge Summary  Patient ID: Roy [name] [facility] Sr. MRN: [id number] DOB/AGE: Sep 08, 1966 54 y.o.  Admit date: 05/26/2020 Discharge date: 06/02/2020  Admission Diagnoses:  Septic hip [facility]) [M00.9]   Discharge Diagnoses: Patient Active Problem List   Diagnosis Date Noted  . 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  . Bipolar disorder (manic depression) (HCC) 05/18/2020  . Adjustment disorder with mixed anxiety and depressed mood 05/18/2020  . Arthritis pain, hip 05/18/2020  . 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 Angioedema 07/24/2019  . MDD (major depressive disorder), recurrent episode (HCC) 03/23/2019  . MDD (major depressive disorder), recurrent episode, moderate (HCC) 02/19/2019  . MDD (major depressive disorder), recurrent severe, without psychosis (HCC) 02/19/2019  . Chest pain 05/27/2018  . Major depressive disorder, recurrent severe without psychotic features (HCC) 05/27/2018  . ARF (acute renal failure) (HCC) 04/08/2018  . Malingering 03/07/2017  . Cocaine dependence (HCC) 02/19/2017  . Substance induced mood disorder (HCC) 02/19/2017  . HIV disease (HCC) 10/26/2016  . HTN (hypertension) 10/26/2016  . Dyslipidemia 10/26/2016  . BPH (benign prostatic hyperplasia) 10/26/2016  . Constipation 10/26/2016    Past Medical History:  Diagnosis Date  . AIDS (acquired immune deficiency syndrome) (HCC)   . Arthritis   . Asthma   . Bipolar disorder (HCC)   . Bronchitis   . Complication of anesthesia   . Depression   . Dysrhythmia    1st degree heart block/ brady  . GERD (gastroesophageal reflux disease)   . Hepatitis C    treated  . HIV (human immunodeficiency virus infection) (HCC)   . HTN (hypertension)   . PONV (postoperative nausea and vomiting)      Transfusion: None   Consultants (if any):   Discharged Condition: Improved  Hospital Course: Roy Kwan Sr. is an 54 y.o. male who was admitted 05/26/2020 with a diagnosis of right septic  total hip and went to the operating room on 05/26/2020 and underwent the above named procedures.    Surgeries: Procedure(s): RIGHT HIP IRRIGATION AND DEBRIDEMENT, WITH EXCHANGE OF FEMORAL HEAD COMPONENT AND ANTIBIOTIC BEADS, VERSAJET on 05/26/2020 Patient tolerated the surgery well. Taken to PACU where she was stabilized and then transferred to the orthopedic floor.  Started on Lovenox 40 mg q 24 hrs. Foot pumps applied bilaterally at 80 mm. Heels elevated on bed with rolled towels. No evidence of DVT. Negative Homan. Physical therapy started on day #1 for gait training and transfer. OT started day #1 for ADL and assisted devices. Patient saw significant improvement after washout.  Patient made good progress of physical therapy, saw significant improvement in pain.  Infectious disease consulted.  He was started on IV vancomycin and ceftriaxone.  Labs and vital signs are stable. Rare strep salivarius/oralis/mitis in culture.  Vanco discontinued and patient continued with ceftriaxone.  Due to patient having a history of illegal drug use we elected not to proceed with PICC line.  Infectious disease recommended once weekly antibiotic Dalvance 500 mg once weekly x5 weeks through peripheral line.   On post op day #7 patient was stable and ready for discharge to home    He was given perioperative antibiotics:  Anti-infectives (From admission, onward)   Start     Dose/Rate Route Frequency Ordered Stop   06/02/20 0000  dalbavancin (DALVANCE) 500 MG SOLR     Discontinue     500 mg Intravenous Weekly 06/02/20 1119 07/01/20 2359   05/27/20 1000  vancomycin (VANCOCIN) IVPB 1000 mg/200 mL premix  Status:  Discontinued        1,000 mg 200 mL/hr over 60 Minutes Intravenous Every 12 hours 05/26/20 1853 05/30/20 1349   05/27/20 0600  ceFAZolin (ANCEF) IVPB 2g/100 mL premix        2 g 200 mL/hr over 30 Minutes Intravenous On call to O.R. 05/26/20 1204 05/26/20 1418   05/26/20 2200  vancomycin (VANCOCIN) IVPB  1000 mg/200 mL premix  Status:  Discontinued        1,000 mg 200 mL/hr over 60 Minutes Intravenous Every 12 hours 05/26/20 1843 05/26/20 1853   05/26/20 2200  cefTRIAXone (ROCEPHIN) 2 g in sodium chloride 0.9 % 100 mL IVPB     Discontinue     2 g 200 mL/hr over 30 Minutes Intravenous Every 24 hours 05/26/20 2121     05/26/20 2000  vancomycin (VANCOREADY) IVPB 2000 mg/400 mL        2,000 mg 200 mL/hr over 120 Minutes Intravenous  Once 05/26/20 1853 05/26/20 2235   05/26/20 1900  bictegravir-emtricitabine-tenofovir AF (BIKTARVY) 50-200-25 MG per tablet 1 tablet     Discontinue     1 tablet Oral Daily 05/26/20 1808     05/26/20 1450  vancomycin (VANCOCIN) powder  Status:  Discontinued          As needed 05/26/20 1450 05/26/20 1608   05/26/20 1208  ceFAZolin (ANCEF) 2-4 GM/100ML-% IVPB       Note to Pharmacy: Trudie Reed   : cabinet override      05/26/20 1208 05/26/20 1418    .  He was given sequential compression devices, early ambulation, and Lovenox, teds for DVT prophylaxis.  He benefited maximally from the hospital stay and there were no complications.    Recent vital signs:  Vitals:   06/01/20 2337 06/02/20 0812  BP: 122/78 122/73  Pulse: (!) 51 (!) 57  Resp: 18 16  Temp: 98.2 F (36.8 C) 98.2 F (36.8 C)  SpO2: 100% 98%    Recent laboratory studies:  Lab Results  Component Value Date   HGB 10.8 (L) 05/28/2020   HGB 11.5 (L) 05/27/2020   HGB 12.6 (L) 05/26/2020   Lab Results  Component Value Date   WBC 8.3 05/28/2020   PLT 213 05/28/2020   Lab Results  Component Value Date   INR 1.0 05/25/2020   Lab Results  Component Value Date   NA 135 05/27/2020   K 5.0 05/27/2020   CL 102 05/27/2020   CO2 26 05/27/2020   BUN 16 05/27/2020   CREATININE 1.06 06/02/2020   GLUCOSE 123 (H) 05/27/2020    Discharge Medications:   Allergies as of 06/02/2020      Reactions   Amlodipine Swelling   Of the tongue   Lisinopril Swelling   Lactose Other (See Comments)   GI  distress   Pollen Extract Other (See Comments)   Itchy eyes and runny nose      Medication List    STOP taking these medications   naproxen 500 MG tablet Commonly known as: NAPROSYN     TAKE these medications   albuterol 108 (90 Base) MCG/ACT inhaler Commonly known as: VENTOLIN HFA Inhale 1-2 puffs into the lungs every 4 (four) hours as needed for wheezing or shortness of breath.   atorvastatin 10 MG tablet Commonly known as: LIPITOR Take 1 tablet (10 mg total) by mouth daily at 6 PM.   Biktarvy 50-200-25 MG Tabs tablet Generic drug: bictegravir-emtricitabine-tenofovir AF Take 1 tablet by mouth daily.  citalopram 40 MG tablet Commonly known as: CELEXA Take 1 tablet (40 mg total) by mouth daily.   dalbavancin 500 MG Solr Commonly known as: DALVANCE Inject 500 mg into the vein once a week for 5 doses.   enoxaparin 40 MG/0.4ML injection Commonly known as: LOVENOX Inject 0.4 mLs (40 mg total) into the skin daily for 14 days.   gabapentin 300 MG capsule Commonly known as: NEURONTIN Take 1 capsule (300 mg total) by mouth 2 (two) times daily.   hydrochlorothiazide 12.5 MG capsule Commonly known as: MICROZIDE Take 1 capsule (12.5 mg total) by mouth daily.   hydrOXYzine 10 MG tablet Commonly known as: ATARAX/VISTARIL Take 1 tablet (10 mg total) by mouth 3 (three) times daily as needed for anxiety.   traMADol 50 MG tablet Commonly known as: ULTRAM Take 1 tablet (50 mg total) by mouth every 6 (six) hours as needed.   trazodone 300 MG tablet Commonly known as: DESYREL Take 1 tablet (300 mg total) by mouth at bedtime as needed for sleep.            Durable Medical Equipment  (From admission, onward)         Start     Ordered   05/31/20 1344  For home use only DME Walker rolling  Once       Question Answer Comment  Walker: With 5 Inch Wheels   Patient needs a walker to treat with the following condition Weakness      05/31/20 1343          Diagnostic  Studies: DG HIP UNILAT W OR W/O PELVIS 2-3 VIEWS RIGHT  Result Date: 05/26/2020 CLINICAL DATA:  RIGHT irrigation and debridement with exchange of femoral component of RIGHT hip arthroplasty and antibiotic beads. EXAM: DG HIP (WITH OR WITHOUT PELVIS) 2-3V RIGHT COMPARISON:  04/02/2020 FINDINGS: RIGHT total hip arthroplasty changes noted. Antibiotic beads are identified. No gross complicating features are noted. IMPRESSION: RIGHT total hip arthroplasty and antibiotic bead placement. No gross complicating features. Electronically Signed   By: Harmon Pier M.D.   On: 05/26/2020 17:24    Disposition:      Follow-up Information    Kennedy Bucker, MD On 07/08/2020.   Specialty: Orthopedic Surgery Why: AT 1015 Contact information: [address] [facility] [place] [address] [phone]        Ronnette Juniper On 06/09/2020.   Specialties: Orthopedic Surgery, Emergency Medicine Why: AT [address] Contact information: [address] [place] [address] [phone]                Signed: Patience Musca 06/02/2020, 11:20 AM

## 2020-06-02 NOTE — Progress Notes (Addendum)
Nutrition Follow-up  DOCUMENTATION CODES:   Not applicable  INTERVENTION:  Recommend placing diet back on regular diet as he reports he does not need mechanical soft diet.  Will discontinue Ensure Enlive.  Provide Ensure Max Protein po once daily, each supplement provides 150 kcal and 30 grams of protein. This product is suitable for patients with lactose-intolerance.  NUTRITION DIAGNOSIS:   Increased nutrient needs related to wound healing as evidenced by estimated needs.  Ongoing.  GOAL:   Patient will meet greater than or equal to 90% of their needs  Met.  MONITOR:   Labs, Skin, Supplement acceptance, PO intake, Weight trends  REASON FOR ASSESSMENT:   Malnutrition Screening Tool    ASSESSMENT:   54 year old male admitted for hip irrigation and debridement with history of right total hip replacement in October 2020. Past medical history significant for bipolar disorder, MDD, angioedema, HIV, HTN, HLD, GERD, and Hepatitis C  Met with patient at bedside. He reports his appetite and intake are good. He is eating 100% of his meals. In the past 24 hours patient met >100% kcal needs from meals and 79% minimum protein needs from meals. He is drinking Ensure Enlive BID. Will change to Ensure Max Protein po once daily. Patient is asking why his food is coming chopped up (mechanical soft). Reviewed with him that it was downgraded last week by RD as he had reported difficulty chewing. Patient reports he can chew meats and all food available on menu here. He can only not chew really hard food such as peanuts. He is requesting diet be upgraded back to regular.  Patient with discharge order. Per RN patient will not discharge today.  Medications reviewed and include: Colace 100 mg BID, ceftriaxone.  Labs reviewed.  Diet Order:   Diet Order            DIET DYS 3 Room service appropriate? Yes; Fluid consistency: Thin  Diet effective now                EDUCATION NEEDS:    Education needs have been addressed  Skin:  Skin Assessment: Skin Integrity Issues: Skin Integrity Issues:: Incisions Incisions: closed incision right hip with wound VAC  Last BM:  05/31/2020 - medium type 1  Height:   Ht Readings from Last 1 Encounters:  05/27/20 _0  (1.753 m)   Weight:   Wt Readings from Last 1 Encounters:  05/27/20 84.8 kg   BMI:  Body mass index is 27.61 kg/m.  Estimated Nutritional Needs:   Kcal:  2200-2400  Protein:  110-120  Fluid:  >/= 2.2 L/day  Jacklynn Barnacle, MS, RD, LDN Pager number available on Amion

## 2020-06-02 NOTE — Progress Notes (Signed)
After conflicting information received by Dr. Rivka Safer regarding need for PA for [facility] called again.  Spoke with  Lonia Mad, Ref # for call [phone], "it doesn't look like PA is required for either Dalbivance or Oritavancin" "Medicaid should pay for in home or outpatient administration".  Information given to Dr. Rivka Safer, [facility] and pharmacy.

## 2020-06-02 NOTE — TOC Progression Note (Signed)
Transition of Care (TOC) - Progression Note    Patient Details  Name: Roy Lindner [facility] Sr. MRN: [id number] Date of Birth: 06-Feb-1966  Transition of [facility]) CM/SW Contact  Barrie Dunker, RN Phone Number: 06/02/2020, 3:16 PM  Clinical Narrative:    I spoke with the patient to get the address of where he will be going at [place], He will be discharging to a boarding house at [address] [facility]. [place] Working to see if we can get prior auth for home infusion or outpatient infusion for weekly ABX. I called Pam with [facility] and asked for a call back via Voice Mail        Expected Discharge Plan and Services           Expected Discharge Date: 06/02/20                                     Social Determinants of Health (SDOH) Interventions    Readmission Risk Interventions No flowsheet data found.

## 2020-06-02 NOTE — TOC Progression Note (Signed)
Transition of Care (TOC) - Progression Note    Patient Details  Name: Turner Baillie [facility] Sr. MRN: [id number] Date of Birth: 01/11/1966  [facility] of [facility]) CM/SW Contact  Su Hilt, RN Phone Number: 06/02/2020, 3:40 PM  Clinical Narrative:     Met with the patient and provided a time between 7 am and 3 pm to be at the same day surgery center, He stated understanding, I let him know he could call his ride to be discharged home, he stated that he does not have a ride, I provided a taxi voucher to go home to [place]       Expected Discharge Plan and Services           Expected Discharge Date: 06/02/20                                     Social Determinants of Health (SDOH) Interventions    Readmission Risk Interventions No flowsheet data found.

## 2020-06-02 NOTE — Progress Notes (Signed)
Discharge summary reviewed with verbal understanding. Answered all questions. Wound vac converted to prevena. Escorted to cab via wc

## 2020-06-02 NOTE — Progress Notes (Signed)
Physical Therapy Treatment Patient Details Name: Roy Luviano [facility] Sr. MRN: [id number] DOB: [birth year] Today's Date: 06/02/2020    History of Present Illness Pt admitted for septic R hip and is s/p I&D with exchange of femoral head and bead placement on 05/26/20. History includes previous R THR (2020), bipolar, HIV, MDD, cocaine, and HTN     PT Comments    In and out of bed with ease.  He is able to complete x 3 laps around unit with no AD and generally steady gait.  Some decreased speed but overall does well with no LOB or buckling noted.  He reports feeling comfortable with gait without AD and does not feel like he needs to use one at this time.  Reports comfort with stair training.     Follow Up Recommendations  Home health PT;Supervision - Intermittent     Equipment Recommendations  None recommended by PT    Recommendations for Other Services       Precautions / Restrictions Precautions Precautions: Fall Restrictions Weight Bearing Restrictions: Yes RLE Weight Bearing: Weight bearing as tolerated    Mobility  Bed Mobility Overal bed mobility: Modified Independent Bed Mobility: Supine to Sit;Sit to Supine     Supine to sit: Modified independent (Device/Increase time) Sit to supine: Modified independent (Device/Increase time)      Transfers Overall transfer level: Needs assistance Equipment used: None Transfers: Sit to/from Stand Sit to Stand: Supervision            Ambulation/Gait Ambulation/Gait assistance: Min guard;Supervision Gait Distance (Feet): 500 Feet Assistive device: None Gait Pattern/deviations: WFL(Within Functional Limits);Step-through pattern Gait velocity: decreased   General Gait Details: Pt was able to ambulate this date without AD 500 ft with CGA at first, progressing to supervision only   Stairs             Wheelchair Mobility    Modified Rankin (Stroke Patients Only)       Balance Overall balance assessment:  Modified Independent;Needs assistance Sitting-balance support: Feet supported Sitting balance-Leahy Scale: Good     Standing balance support: No upper extremity supported;During functional activity Standing balance-Leahy Scale: Good Standing balance comment: no LOB even without UE support                            Cognition Arousal/Alertness: Awake/alert Behavior During Therapy: WFL for tasks assessed/performed Overall Cognitive Status: Within Functional Limits for tasks assessed                                        Exercises Other Exercises Other Exercises: Pt instructed in incentive spirometer use, compression stocking mgt (did not recall from previous session), and falls prevention strategies    General Comments        Pertinent Vitals/Pain Pain Assessment: No/denies pain    Home Living                      Prior Function            PT Goals (current goals can now be found in the care plan section) Acute Rehab PT Goals Patient Stated Goal: To return to PLOF  Progress towards PT goals: Progressing toward goals    Frequency    7X/week      PT Plan Current plan remains appropriate    Co-evaluation  AM-PAC PT "6 Clicks" Mobility   Outcome Measure  Help needed turning from your back to your side while in a flat bed without using bedrails?: None Help needed moving from lying on your back to sitting on the side of a flat bed without using bedrails?: None Help needed moving to and from a bed to a chair (including a wheelchair)?: None Help needed standing up from a chair using your arms (e.g., wheelchair or bedside chair)?: None Help needed to walk in hospital room?: A Little Help needed climbing 3-5 steps with a railing? : A Little 6 Click Score: 22    End of Session Equipment Utilized During Treatment: Gait belt Activity Tolerance: Patient tolerated treatment well Patient left: in bed;with bed alarm  set;with call bell/phone within reach Nurse Communication: Mobility status Pain - Right/Left: Right Pain - part of body: Hip     Time: 7017-7939 PT Time Calculation (min) (ACUTE ONLY): 15 min  Charges:  $Gait Training: 8-22 mins                    Danielle Dess, PTA 06/02/20, 10:11 AM

## 2020-06-02 NOTE — Progress Notes (Signed)
Occupational Therapy Treatment Patient Details Name: Roy Luu [facility] Sr. MRN: [id number] DOB: 11-Nov-1966 Today's Date: 05/31/2020    History of present illness (P) Pt admitted for septic R hip and is s/p I&D with exchange of femoral head and bead placement on 05/26/20. History includes previous R THR (2020), bipolar, HIV, MDD, cocaine, and HTN    OT comments  Pt seen for OT tx this date. Pt instructed in incentive spirometer use, compression stocking mgt (did not recall from previous session), and falls prevention strategies. Pt verbalized understanding. Pt expressed concern regarding discharge disposition. Active listening and emotional support provided. Pt agreeable to therapist reaching out to case manager for additional assist. Pt appreciative. Pt continues to benefit from skilled OT services. Continue to recommend STR at discharge to maximize pt's safety and return to independence while minimizing falls risk, risk of readmission, and caregiver burden.    Follow Up Recommendations  SNF    Equipment Recommendations       Recommendations for Other Services      Precautions / Restrictions Precautions Precautions: (P) Fall Restrictions Weight Bearing Restrictions: Yes RLE Weight Bearing: Weight bearing as tolerated       Mobility Bed Mobility                  Transfers                      Balance                                           ADL either performed or assessed with clinical judgement   ADL Overall ADL's : (P) Needs assistance/impaired                                       General ADL Comments: (P) Pt able to perform LLE dressing without assist, requires Mod-Max A for RLE dressing 2/2 hip pain. CGA for ADL transfers     Vision       Perception     Praxis      Cognition Arousal/Alertness: (P) Awake/alert Behavior During Therapy: (P) WFL for tasks assessed/performed Overall Cognitive Status: (P)  Within Functional Limits for tasks assessed                                          Exercises Other Exercises Other Exercises: (P) Pt instructed in incentive spirometer use, compression stocking mgt (did not recall from previous session), and falls prevention strategies   Shoulder Instructions       General Comments      Pertinent Vitals/ Pain          Home Living                                          Prior Functioning/Environment              Frequency  (P) Min 2X/week        Progress Toward Goals  OT Goals(current goals can now be found in the care plan section)     Acute Rehab  OT Goals Patient Stated Goal: (P) To return to PLOF  OT Goal Formulation: (P) With patient Time For Goal Achievement: (P) 06/10/20 Potential to Achieve Goals: (P) Good  Plan      Co-evaluation                 AM-PAC OT "6 Clicks" Daily Activity     Outcome Measure   Help from another person eating meals?: (P) None Help from another person taking care of personal grooming?: (P) None Help from another person toileting, which includes using toliet, bedpan, or urinal?: (P) A Little Help from another person bathing (including washing, rinsing, drying)?: (P) A Little Help from another person to put on and taking off regular upper body clothing?: (P) A Little Help from another person to put on and taking off regular lower body clothing?: (P) A Lot 6 Click Score: (P) 19    End of Session    OT Visit Diagnosis: (P) Unsteadiness on feet (R26.81);Other abnormalities of gait and mobility (R26.89)   Activity Tolerance (P) Patient tolerated treatment well   Patient Left (P) in bed;with call bell/phone within reach;with bed alarm set   Nurse Communication          Time: (P) 0934-(P) 1000 OT Time Calculation (min): (P) 26 min  Charges:    Roy [name], MPH, MS, OTR/L ascom [phone] 06/02/20, 9:24 AM  *Late entry note. Original  date/time reflected in date of service.

## 2020-06-02 NOTE — Discharge Instructions (Signed)

## 2020-06-02 NOTE — Progress Notes (Signed)
   Subjective: 7 Days Post-Op Procedure(s) (LRB): RIGHT HIP IRRIGATION AND DEBRIDEMENT, WITH EXCHANGE OF FEMORAL HEAD COMPONENT AND ANTIBIOTIC BEADS, VERSAJET (Right) Patient reports pain as mild.   Patient is well, and has had no acute complaints or problems Denies any CP, SOB, ABD pain. We will continue therapy today.   Objective: Vital signs in last 24 hours: Temp:  [97.8 F (36.6 C)-98.2 F (36.8 C)] 98.2 F (36.8 C) (06/16 2337) Pulse Rate:  [51] 51 (06/16 2337) Resp:  [16-18] 18 (06/16 2337) BP: (102-122)/(72-78) 122/78 (06/16 2337) SpO2:  [100 %] 100 % (06/16 2337)  Intake/Output from previous day: 06/16 0701 - 06/17 0700 In: 720 [P.O.:720] Out: 2875 [Urine:2850; Drains:25] Intake/Output this shift: No intake/output data recorded.  No results for input(s): HGB in the last 72 hours. No results for input(s): WBC, RBC, HCT, PLT in the last 72 hours. Recent Labs    06/02/20 0531  CREATININE 1.06   No results for input(s): LABPT, INR in the last 72 hours.  EXAM General - Patient is Alert, Appropriate and Oriented Extremity - Neurovascular intact Sensation intact distally Intact pulses distally Dorsiflexion/Plantar flexion intact No cellulitis present Compartment soft Dressing - dressing C/D/I, Praveena intact with total of 250cc of bloody drainage, stable Motor Function - intact, moving foot and toes well on exam.    Past Medical History:  Diagnosis Date  . AIDS (acquired immune deficiency syndrome) (HCC)   . Arthritis   . Asthma   . Bipolar disorder (HCC)   . Bronchitis   . Complication of anesthesia   . Depression   . Dysrhythmia    1st degree heart block/ brady  . GERD (gastroesophageal reflux disease)   . Hepatitis C    treated  . HIV (human immunodeficiency virus infection) (HCC)   . HTN (hypertension)   . PONV (postoperative nausea and vomiting)     Assessment/Plan:   7 Days Post-Op Procedure(s) (LRB): RIGHT HIP IRRIGATION AND DEBRIDEMENT,  WITH EXCHANGE OF FEMORAL HEAD COMPONENT AND ANTIBIOTIC BEADS, VERSAJET (Right) Active Problems:   Septic hip (HCC)  Estimated body mass index is 27.61 kg/m as calculated from the following:   Height as of this encounter: 5\' 9"  (1.753 m).   Weight as of this encounter: 84.8 kg. Advance diet Up with therapy  Continue with IV antibiotics per infectious disease.  Currently on Dalbavancin.  Culture showed rare strep salivarius/oralis/mitis. appreciate infectious disease input. Vital signs are stable Pain well controlled Care management to assist with discharge. Patient states he has found a boarding house, wanting to discuss with CM  DVT Prophylaxis - Lovenox and SCDs Weight-Bearing as tolerated to right leg  T. , PA-C [facility] 06/02/2020, 7:27 AM

## 2020-06-03 ENCOUNTER — Ambulatory Visit
Admission: AD | Admit: 2020-06-03 | Discharge: 2020-06-03 | Disposition: A | Discharge status: Home / Self Care | Payer: Medicaid Other | Source: Home / Self Care | Attending: Infectious Diseases | Admitting: Infectious Diseases

## 2020-06-03 ENCOUNTER — Other Ambulatory Visit: Payer: Self-pay

## 2020-06-03 ENCOUNTER — Inpatient Hospital Stay: Admit: 2020-06-03 | Payer: Medicaid Other

## 2020-06-03 LAB — T-HELPER CELLS CD4/CD8 %
% CD 4 Pos. Lymph.: 31.5 % (ref 30.8–58.5)
Absolute CD 4 Helper: 599 /uL (ref 359–1519)
Basophils Absolute: 0 10*3/uL (ref 0.0–0.2)
Basos: 1 %
CD3+CD4+ Cells/CD3+CD8+ Cells Bld: 0.59 — ABNORMAL LOW (ref 0.92–3.72)
CD3+CD8+ Cells # Bld: 1015 /uL — ABNORMAL HIGH (ref 109–897)
CD3+CD8+ Cells NFr Bld: 53.4 % — ABNORMAL HIGH (ref 12.0–35.5)
EOS (ABSOLUTE): 0.1 10*3/uL (ref 0.0–0.4)
Eos: 3 %
Hematocrit: 35.3 % — ABNORMAL LOW (ref 37.5–51.0)
Hemoglobin: 11.8 g/dL — ABNORMAL LOW (ref 13.0–17.7)
Immature Grans (Abs): 0.2 10*3/uL — ABNORMAL HIGH (ref 0.0–0.1)
Immature Granulocytes: 4 %
Lymphocytes Absolute: 1.9 10*3/uL (ref 0.7–3.1)
Lymphs: 37 %
MCH: 33 pg (ref 26.6–33.0)
MCHC: 33.4 g/dL (ref 31.5–35.7)
MCV: 99 fL — ABNORMAL HIGH (ref 79–97)
Monocytes Absolute: 0.6 10*3/uL (ref 0.1–0.9)
Monocytes: 12 %
Neutrophils Absolute: 2.4 10*3/uL (ref 1.4–7.0)
Neutrophils: 43 %
Platelets: 300 10*3/uL (ref 150–450)
RBC: 3.58 x10E6/uL — ABNORMAL LOW (ref 4.14–5.80)
RDW: 12 % (ref 11.6–15.4)
WBC: 5.3 10*3/uL (ref 3.4–10.8)

## 2020-06-03 LAB — CBC WITH DIFFERENTIAL/PLATELET
Abs Immature Granulocytes: 0.18 10*3/uL — ABNORMAL HIGH (ref 0.00–0.07)
Basophils Absolute: 0 10*3/uL (ref 0.0–0.1)
Basophils Relative: 1 %
Eosinophils Absolute: 0.1 10*3/uL (ref 0.0–0.5)
Eosinophils Relative: 2 %
HCT: 33.8 % — ABNORMAL LOW (ref 39.0–52.0)
Hemoglobin: 11.8 g/dL — ABNORMAL LOW (ref 13.0–17.0)
Immature Granulocytes: 3 %
Lymphocytes Relative: 33 %
Lymphs Abs: 2 10*3/uL (ref 0.7–4.0)
MCH: 32.9 pg (ref 26.0–34.0)
MCHC: 34.9 g/dL (ref 30.0–36.0)
MCV: 94.2 fL (ref 80.0–100.0)
Monocytes Absolute: 0.6 10*3/uL (ref 0.1–1.0)
Monocytes Relative: 10 %
Neutro Abs: 3.2 10*3/uL (ref 1.7–7.7)
Neutrophils Relative %: 51 %
Platelets: 299 10*3/uL (ref 150–400)
RBC: 3.59 MIL/uL — ABNORMAL LOW (ref 4.22–5.81)
RDW: 12 % (ref 11.5–15.5)
WBC: 6.1 10*3/uL (ref 4.0–10.5)
nRBC: 0 % (ref 0.0–0.2)

## 2020-06-03 LAB — COMPREHENSIVE METABOLIC PANEL
ALT: 35 U/L (ref 0–44)
AST: 19 U/L (ref 15–41)
Albumin: 3.2 g/dL — ABNORMAL LOW (ref 3.5–5.0)
Alkaline Phosphatase: 87 U/L (ref 38–126)
Anion gap: 6 (ref 5–15)
BUN: 20 mg/dL (ref 6–20)
CO2: 28 mmol/L (ref 22–32)
Calcium: 8.6 mg/dL — ABNORMAL LOW (ref 8.9–10.3)
Chloride: 102 mmol/L (ref 98–111)
Creatinine, Ser: 1.02 mg/dL (ref 0.61–1.24)
GFR calc Af Amer: >60 mL/min (ref >60)
GFR calc non Af Amer: >60 mL/min (ref >60)
Glucose, Bld: 141 mg/dL — ABNORMAL HIGH (ref 70–99)
Potassium: 4.6 mmol/L (ref 3.5–5.1)
Sodium: 136 mmol/L (ref 135–145)
Total Bilirubin: 0.4 mg/dL (ref 0.3–1.2)
Total Protein: 6.8 g/dL (ref 6.5–8.1)

## 2020-06-03 LAB — HIV-1 RNA QUANT-NO REFLEX-BLD
HIV 1 RNA Quant: 20 copies/mL
LOG10 HIV-1 RNA: 1.301 log10copy/mL

## 2020-06-03 LAB — C-REACTIVE PROTEIN: CRP: 1.4 mg/dL — ABNORMAL HIGH (ref <1.0)

## 2020-06-03 LAB — SEDIMENTATION RATE: Sed Rate: 40 mm/hr — ABNORMAL HIGH (ref 0–20)

## 2020-06-03 LAB — ACID FAST SMEAR (AFB, MYCOBACTERIA): Acid Fast Smear: NEGATIVE

## 2020-06-03 MED ORDER — DEXTROSE 5 % IV SOLN
1000.0000 mg | Freq: Once | INTRAVENOUS | Status: AC
  Administered 2020-06-03: 1000 mg via INTRAVENOUS
  Filled 2020-06-03: qty 50

## 2020-06-03 MED ORDER — DALBAVANCIN HCL 500 MG IV SOLR
1000.0000 mg | Freq: Once | INTRAVENOUS | Status: DC
  Filled 2020-06-03: qty 50

## 2020-06-08 ENCOUNTER — Ambulatory Visit
Admit: 2020-06-08 | Discharge: 2020-06-09 | Payer: MEDICAID | Attending: Addiction (Substance Use Disorder) | Primary: Addiction (Substance Use Disorder)

## 2020-06-08 DIAGNOSIS — B20 Human immunodeficiency virus [HIV] disease: Principal | ICD-10-CM

## 2020-06-10 ENCOUNTER — Other Ambulatory Visit: Payer: Self-pay

## 2020-06-10 ENCOUNTER — Ambulatory Visit
Admission: RE | Admit: 2020-06-10 | Discharge: 2020-06-10 | Disposition: A | Discharge status: Home / Self Care | Payer: Medicaid Other | Source: Ambulatory Visit | Attending: Infectious Diseases | Admitting: Infectious Diseases

## 2020-06-10 DIAGNOSIS — Z96651 Presence of right artificial knee joint: Secondary | ICD-10-CM | POA: Diagnosis not present

## 2020-06-10 DIAGNOSIS — M009 Pyogenic arthritis, unspecified: Secondary | ICD-10-CM | POA: Diagnosis not present

## 2020-06-10 DIAGNOSIS — T8453XA Infection and inflammatory reaction due to internal right knee prosthesis, initial encounter: Secondary | ICD-10-CM | POA: Diagnosis present

## 2020-06-10 LAB — COMPREHENSIVE METABOLIC PANEL
ALT: 16 U/L (ref 0–44)
AST: 13 U/L — ABNORMAL LOW (ref 15–41)
Albumin: 3.8 g/dL (ref 3.5–5.0)
Alkaline Phosphatase: 111 U/L (ref 38–126)
Anion gap: 9 (ref 5–15)
BUN: 14 mg/dL (ref 6–20)
CO2: 26 mmol/L (ref 22–32)
Calcium: 8.9 mg/dL (ref 8.9–10.3)
Chloride: 103 mmol/L (ref 98–111)
Creatinine, Ser: 1.1 mg/dL (ref 0.61–1.24)
GFR calc Af Amer: >60 mL/min (ref >60)
GFR calc non Af Amer: >60 mL/min (ref >60)
Glucose, Bld: 100 mg/dL — ABNORMAL HIGH (ref 70–99)
Potassium: 4.9 mmol/L (ref 3.5–5.1)
Sodium: 138 mmol/L (ref 135–145)
Total Bilirubin: 0.8 mg/dL (ref 0.3–1.2)
Total Protein: 7.7 g/dL (ref 6.5–8.1)

## 2020-06-10 LAB — CBC WITH DIFFERENTIAL/PLATELET
Abs Immature Granulocytes: 0.03 10*3/uL (ref 0.00–0.07)
Basophils Absolute: 0 10*3/uL (ref 0.0–0.1)
Basophils Relative: 0 %
Eosinophils Absolute: 0.1 10*3/uL (ref 0.0–0.5)
Eosinophils Relative: 1 %
HCT: 37.8 % — ABNORMAL LOW (ref 39.0–52.0)
Hemoglobin: 12.9 g/dL — ABNORMAL LOW (ref 13.0–17.0)
Immature Granulocytes: 1 %
Lymphocytes Relative: 38 %
Lymphs Abs: 2 10*3/uL (ref 0.7–4.0)
MCH: 32.9 pg (ref 26.0–34.0)
MCHC: 34.1 g/dL (ref 30.0–36.0)
MCV: 96.4 fL (ref 80.0–100.0)
Monocytes Absolute: 0.3 10*3/uL (ref 0.1–1.0)
Monocytes Relative: 6 %
Neutro Abs: 2.8 10*3/uL (ref 1.7–7.7)
Neutrophils Relative %: 54 %
Platelets: 316 10*3/uL (ref 150–400)
RBC: 3.92 MIL/uL — ABNORMAL LOW (ref 4.22–5.81)
RDW: 12.5 % (ref 11.5–15.5)
WBC: 5.2 10*3/uL (ref 4.0–10.5)
nRBC: 0 % (ref 0.0–0.2)

## 2020-06-10 LAB — C-REACTIVE PROTEIN: CRP: 0.6 mg/dL (ref <1.0)

## 2020-06-10 LAB — SEDIMENTATION RATE: Sed Rate: 26 mm/hr — ABNORMAL HIGH (ref 0–20)

## 2020-06-10 MED ORDER — DEXTROSE 5 % IV SOLN
500.0000 mg | INTRAVENOUS | Status: DC
  Administered 2020-06-10: 500 mg via INTRAVENOUS
  Filled 2020-06-10: qty 25

## 2020-06-13 DIAGNOSIS — B20 Human immunodeficiency virus [HIV] disease: Principal | ICD-10-CM

## 2020-06-13 MED ORDER — BIKTARVY 50 MG-200 MG-25 MG TABLET
ORAL_TABLET | Freq: Every day | ORAL | 2 refills | 30 days | Status: CP
Start: 2020-06-13 — End: 2020-09-11

## 2020-06-15 ENCOUNTER — Ambulatory Visit: Admit: 2020-06-15 | Discharge: 2020-06-16 | Payer: MEDICAID

## 2020-06-15 ENCOUNTER — Telehealth: Payer: Self-pay

## 2020-06-15 DIAGNOSIS — B20 Human immunodeficiency virus [HIV] disease: Principal | ICD-10-CM

## 2020-06-15 NOTE — Telephone Encounter (Signed)
Patient has been with [facility] for years and they have agreed to take over the recent hospitalization and hip issues. He said they are helping with dental and counceling and he would like to stay with [facility] moving forward.

## 2020-06-16 LAB — AEROBIC/ANAEROBIC CULTURE W GRAM STAIN (SURGICAL/DEEP WOUND)

## 2020-06-17 ENCOUNTER — Other Ambulatory Visit: Payer: Self-pay

## 2020-06-17 ENCOUNTER — Emergency Department
Admission: EM | Admit: 2020-06-17 | Discharge: 2020-06-17 | Disposition: A | Discharge status: Home / Self Care | Payer: Medicaid Other | Attending: Emergency Medicine | Admitting: Emergency Medicine

## 2020-06-17 ENCOUNTER — Encounter: Payer: Self-pay | Admitting: Emergency Medicine

## 2020-06-17 ENCOUNTER — Ambulatory Visit
Admission: RE | Admit: 2020-06-17 | Discharge: 2020-06-17 | Disposition: A | Discharge status: Home / Self Care | Payer: Medicaid Other | Source: Ambulatory Visit | Attending: Infectious Diseases | Admitting: Infectious Diseases

## 2020-06-17 DIAGNOSIS — T8453XA Infection and inflammatory reaction due to internal right knee prosthesis, initial encounter: Secondary | ICD-10-CM | POA: Diagnosis present

## 2020-06-17 DIAGNOSIS — Z96651 Presence of right artificial knee joint: Secondary | ICD-10-CM | POA: Insufficient documentation

## 2020-06-17 DIAGNOSIS — R21 Rash and other nonspecific skin eruption: Secondary | ICD-10-CM | POA: Diagnosis present

## 2020-06-17 DIAGNOSIS — I10 Essential (primary) hypertension: Secondary | ICD-10-CM | POA: Insufficient documentation

## 2020-06-17 DIAGNOSIS — B999 Unspecified infectious disease: Secondary | ICD-10-CM | POA: Insufficient documentation

## 2020-06-17 DIAGNOSIS — F17210 Nicotine dependence, cigarettes, uncomplicated: Secondary | ICD-10-CM | POA: Insufficient documentation

## 2020-06-17 DIAGNOSIS — J45909 Unspecified asthma, uncomplicated: Secondary | ICD-10-CM | POA: Diagnosis not present

## 2020-06-17 DIAGNOSIS — Z79899 Other long term (current) drug therapy: Secondary | ICD-10-CM | POA: Insufficient documentation

## 2020-06-17 LAB — CULTURE, FUNGUS WITHOUT SMEAR

## 2020-06-17 MED ORDER — HYDROXYZINE HCL 50 MG PO TABS: 50.0000 mg | ORAL_TABLET | Freq: Three times a day (TID) | ORAL | 0 refills | Status: DC | PRN

## 2020-06-17 MED ORDER — HYDROXYZINE HCL 50 MG PO TABS
50.0000 mg | ORAL_TABLET | Freq: Once | ORAL | Status: AC
  Administered 2020-06-17: 50 mg via ORAL
  Filled 2020-06-17: qty 1

## 2020-06-17 MED ORDER — DEXTROSE 5 % IV SOLN
500.0000 mg | Freq: Once | INTRAVENOUS | Status: AC
  Administered 2020-06-17: 500 mg via INTRAVENOUS
  Filled 2020-06-17: qty 25

## 2020-06-17 MED ORDER — HYDROXYZINE HCL 50 MG TABLET
ORAL_TABLET | 0 refills | 0 days
Start: 2020-06-17 — End: ?

## 2020-06-17 NOTE — ED Provider Notes (Signed)
[facility] Emergency Department Provider Note   ____________________________________________   First MD Initiated Contact with Patient 06/17/20 1209     (approximate)  I have reviewed the triage vital signs and the nursing notes.   HISTORY  Chief Complaint Rash    HPI Roy Mcginness Sr. is a 54 y.o. male patient complain of diffuse itchy rash status post starting Dalbance IV for bacterial skin infection.  Patient started states started a 6-week regimen last week and reported a rash after the initial therapy.  Patient state he was given a second dosage today and was told to come to the emergency room secondary to the increased itching.  Patient denies any other anaphylactic signs and symptoms.  Patient also denies other side effects of the medicine i.e. diarrhea, nausea, headache, or vomiting.         Past Medical History:  Diagnosis Date  . AIDS (acquired immune deficiency syndrome) (HCC)   . Arthritis   . Asthma   . Bipolar disorder (HCC)   . Bronchitis   . Complication of anesthesia   . Depression   . Dysrhythmia    1st degree heart block/ brady  . GERD (gastroesophageal reflux disease)   . Hepatitis C    treated  . HIV (human immunodeficiency virus infection) (HCC)   . HTN (hypertension)   . PONV (postoperative nausea and vomiting)     Patient Active Problem List   Diagnosis Date Noted  . 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  . Bipolar disorder (manic depression) (HCC) 05/18/2020  . Adjustment disorder with mixed anxiety and depressed mood 05/18/2020  . Arthritis pain, hip 05/18/2020  . 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 Angioedema 07/24/2019  . MDD (major depressive disorder), recurrent episode (HCC) 03/23/2019  . MDD (major depressive disorder), recurrent episode, moderate (HCC) 02/19/2019  . MDD (major depressive disorder), recurrent severe, without psychosis (HCC) 02/19/2019  . Chest pain 05/27/2018  . Major  depressive disorder, recurrent severe without psychotic features (HCC) 05/27/2018  . ARF (acute renal failure) (HCC) 04/08/2018  . Malingering 03/07/2017  . Cocaine dependence (HCC) 02/19/2017  . Substance induced mood disorder (HCC) 02/19/2017  . HIV disease (HCC) 10/26/2016  . HTN (hypertension) 10/26/2016  . Dyslipidemia 10/26/2016  . BPH (benign prostatic hyperplasia) 10/26/2016  . Constipation 10/26/2016    Past Surgical History:  Procedure Laterality Date  . APPLICATION OF WOUND VAC Right 09/01/2019   Procedure: APPLICATION OF WOUND VAC;  Surgeon: Kennedy Bucker, MD;  Location: [facility] ORS;  Service: Orthopedics;  Laterality: Right;  Serial # [id number]  . HERNIA REPAIR Left    inguinal  . TOE SURGERY    . TOE SURGERY Right   . TOTAL HIP ARTHROPLASTY Right 09/01/2019   Procedure: TOTAL HIP ARTHROPLASTY ANTERIOR APPROACH;  Surgeon: Kennedy Bucker, MD;  Location: [facility] ORS;  Service: Orthopedics;  Laterality: Right;    Prior to Admission medications   Medication Sig Start Date End Date Taking? Authorizing Provider  albuterol (VENTOLIN HFA) 108 (90 Base) MCG/ACT inhaler Inhale 1-2 puffs into the lungs every 4 (four) hours as needed for wheezing or shortness of breath. 05/23/20   Clapacs, Jackquline Denmark, MD  atorvastatin (LIPITOR) 10 MG tablet Take 1 tablet (10 mg total) by mouth daily at 6 PM. 05/23/20   Clapacs, Jackquline Denmark, MD  bictegravir-emtricitabine-tenofovir AF (BIKTARVY) 50-200-25 MG TABS tablet Take 1 tablet by mouth daily. 05/24/20   Clapacs, Jackquline Denmark, MD  citalopram (CELEXA) 40 MG tablet Take 1  tablet (40 mg total) by mouth daily. 05/24/20   Clapacs, Jackquline Denmark, MD  dalbavancin (DALVANCE) 500 MG SOLR Inject 500 mg into the vein once a week for 5 doses. 06/02/20 07/01/20  Evon Slack, PA-C  enoxaparin (LOVENOX) 40 MG/0.4ML injection Inject 0.4 mLs (40 mg total) into the skin daily for 14 days. 06/01/20 06/15/20  Evon Slack, PA-C  gabapentin (NEURONTIN) 300 MG capsule Take 1 capsule (300 mg total) by  mouth 2 (two) times daily. 05/23/20   Clapacs, Jackquline Denmark, MD  hydrochlorothiazide (MICROZIDE) 12.5 MG capsule Take 1 capsule (12.5 mg total) by mouth daily. 05/24/20   Clapacs, Jackquline Denmark, MD  hydrOXYzine (ATARAX/VISTARIL) 10 MG tablet Take 1 tablet (10 mg total) by mouth 3 (three) times daily as needed for anxiety. 05/23/20   Clapacs, Jackquline Denmark, MD  hydrOXYzine (ATARAX/VISTARIL) 50 MG tablet Take 1 tablet (50 mg total) by mouth 3 (three) times daily as needed. 06/17/20   Joni Reining, PA-C  traMADol (ULTRAM) 50 MG tablet Take 1 tablet (50 mg total) by mouth every 6 (six) hours as needed. 06/01/20   Evon Slack, PA-C  traZODone (DESYREL) 300 MG tablet Take 1 tablet (300 mg total) by mouth at bedtime as needed for sleep. 05/23/20   Clapacs, Jackquline Denmark, MD    Allergies Amlodipine, Lisinopril, Lactose, and Pollen extract  Family History  Problem Relation Age of Onset  . Cancer Brother   . Uterine cancer Mother   . CAD Mother   . Hypertension Mother   . Hyperlipidemia Mother     Social History Social History   Tobacco Use  . Smoking status: Current Every Day Smoker    Packs/day: 0.25    Types: Cigarettes  . Smokeless tobacco: Never Used  Vaping Use  . Vaping Use: Never used  Substance Use Topics  . Alcohol use: Yes    Alcohol/week: 84.0 standard drinks    Types: 84 Cans of beer per week    Comment: daily  . Drug use: Yes    Types: Cocaine, "Crack" cocaine    Comment: 05/24/20 says 9 days ago    Review of Systems Constitutional: No fever/chills Eyes: No visual changes. ENT: No sore throat. Cardiovascular: Denies chest pain. Respiratory: Denies shortness of breath. Gastrointestinal: No abdominal pain.  No nausea, no vomiting.  No diarrhea.  No constipation. Genitourinary: Negative for dysuria. Musculoskeletal: Negative for back pain. Skin: Negative for rash. Neurological: Negative for headaches, focal weakness or numbness. Psychiatric: Bipolar,  Depression, and history of substance  abuse. Endocrine:  Hepatitis C, hyperlipidemia, and hypertension. Allergic/Immunilogical: Norvasc, lisinopril, lactulose, pollen extracts.  ____________________________________________   PHYSICAL EXAM:  VITAL SIGNS: ED Triage Vitals  Enc Vitals Group     BP 06/17/20 1154 (!) 157/84     Pulse Rate 06/17/20 1154 66     Resp 06/17/20 1154 16     Temp 06/17/20 1154 99.1 F (37.3 C)     Temp Source 06/17/20 1154 Oral     SpO2 06/17/20 1154 95 %     Weight 06/17/20 1155 186 lb 1.1 oz (84.4 kg)     Height 06/17/20 1155 5\' 9"  (1.753 m)     Head Circumference --      Peak Flow --      Pain Score 06/17/20 1154 0     Pain Loc --      Pain Edu? --      Excl. in GC? --    Constitutional: Alert and oriented.  Well appearing and in no acute distress. Eyes: Conjunctivae are normal. PERRL. EOMI. Head: Atraumatic. Nose: No congestion/rhinnorhea. Mouth/Throat: Mucous membranes are moist.  Oropharynx non-erythematous. Neck: No stridor.   Hematological/Lymphatic/Immunilogical: No cervical lymphadenopathy. Cardiovascular: Normal rate, regular rhythm. Grossly normal heart sounds.  Good peripheral circulation. Respiratory: Normal respiratory effort.  No retractions. Lungs CTAB. Gastrointestinal: Soft and nontender. No distention. No abdominal bruits. No CVA tenderness. Skin:  Skin is warm, dry and intact.  Diffuse maculopapular lesions. Psychiatric: Mood and affect are normal. Speech and behavior are normal.  ____________________________________________   LABS (all labs ordered are listed, but only abnormal results are displayed)  Labs Reviewed - No data to display ____________________________________________  EKG   ____________________________________________  RADIOLOGY  ED MD interpretation:    Official radiology report(s): No results found.  ____________________________________________   PROCEDURES  Procedure(s) performed (including Critical  Care):  Procedures   ____________________________________________   INITIAL IMPRESSION / ASSESSMENT AND PLAN / ED COURSE  As part of my medical decision making, I reviewed the following data within the electronic MEDICAL RECORD NUMBER     Patient presents with diffuse maculopapular rash for 1 week.  State onset of rash was after starting IV antibiotics which will be given weekly for 6 weeks.  Advised patient to discuss rash with treating doctor.  Patient physical exam is negative for any anaphylactic signs and symptoms.  Patient will be started on Atarax 50 mg 3 times daily.  Return to ED if no improvement or worsening of complaint.    Khaleem Burchill Krizek Sr. was evaluated in Emergency Department on 06/17/2020 for the symptoms described in the history of present illness. He was evaluated in the context of the global COVID-19 pandemic, which necessitated consideration that the patient might be at risk for infection with the SARS-CoV-2 virus that causes COVID-19. Institutional protocols and algorithms that pertain to the evaluation of patients at risk for COVID-19 are in a state of rapid change based on information released by regulatory bodies including the CDC and federal and state organizations. These policies and algorithms were followed during the patient's care in the ED.       ____________________________________________   FINAL CLINICAL IMPRESSION(S) / ED DIAGNOSES  Final diagnoses:  Rash and nonspecific skin eruption     ED Discharge Orders         Ordered    hydrOXYzine (ATARAX/VISTARIL) 50 MG tablet  3 times daily PRN     Discontinue  Reprint     06/17/20 1230           Note:  This document was prepared using Dragon voice recognition software and may include unintentional dictation errors.    Joni Reining, PA-C 06/17/20 1232    Chesley Noon, MD 06/22/20 [phone]

## 2020-06-17 NOTE — ED Triage Notes (Signed)
C/O itchy rash to body x 1 week.

## 2020-06-17 NOTE — Discharge Instructions (Signed)
IV Infusion Therapy IV (intravenous) infusion therapy is a type of treatment to deliver a liquid substance (infusion) directly into a vein. You may have IV infusion therapy to receive an infusion of:  Fluids.  Medicines.  Nutrition.  Chemotherapy. This is the use of medicines to stop or slow the growth of cancer cells.  Blood or blood products.  X-ray dye that is given before an imaging procedure, like an MRI or CT scan. Tell a health care provider about:  Any allergies you have.  All medicines you are taking, including vitamins, herbs, eye drops, creams, and over-the-counter medicines.  Any problems you or family members have had with anesthetic medicines or X-ray dyes.  Any blood disorders you have.  Any surgeries you have had.  Any medical conditions you have.  Whether you are pregnant or may be pregnant.  Any history of IV drug use.  Any history of health care providers not being able to find a vein for an IV or blood draw. What are the risks? Generally, this is a safe procedure. However, problems may occur, including:  Pain.  Bruising.  Bleeding.  Infection.  Failure to place the catheter due to inability to find a vein.  Leaking or blockage of the catheter (infiltration).  Damage to blood vessels or nerves.  Allergic reactions to medicines or dyes.  A blood clot. What happens before the procedure?  Follow instructions from your health care provider about eating or drinking restrictions.  Ask your health care provider about changing or stopping your regular medicines. This is especially important if you are taking diabetes medicines or blood thinners.  Learn as much as you can about your treatment. Ask your health care provider for reliable resources, such as websites, books, videos, and people, to help you learn about the treatment you will be having. What happens during the procedure?     IV infusion therapy starts with a procedure to place a  small, thin tube (catheter) into a vein. An IV tube will be attached to the catheter to allow the infusion to flow into your bloodstream. Your catheter may be placed:  Into a vein that is usually in the elbow, forearm, or back of the hand (peripheral IV catheter). This type of catheter may need to be inserted into a vein each time you get an infusion.  Into a vein near your elbow (midline catheter or PICC). This type of catheter may stay in place for weeks or months at a time so you can receive repeated infusions through it.  Into a vein near your neck that leads to your heart (non-tunneled catheter). This type of catheter is only used for short amounts of time because it can cause infection.  Through the skin of your chest and into a large vein that leads to your heart (tunneled catheter). This type of catheter may stay in your body for months or years.  So that it connects to an implanted port. An implanted port is a device that is surgically inserted under the skin of the chest to provide long-term IV access. The catheter will connect the port to a large vein in the chest or upper arm. ? A port may be kept in place for many months or years. ? Each time you have an infusion, a needle will be inserted through your skin to connect the catheter to the port. After your catheter is placed, your health care team will lower your risk of infection by:  Washing or sanitizing their   hands.  Cleaning the IV site with a germ-killing (antiseptic) solution. To start the infusion, your health care provider will:  Attach the IV tubing to your catheter.  Use a tape or an adhesive bandage (dressing) to hold the catheter and tubing in place against your skin.  Use an IV pump to control the flow of the IV infusion, if needed. During the infusion, your health care provider will check the area to make sure:  There is no swelling.  Your IV infusion is flowing through the catheter properly. After the  infusion, your health care provider will:  Remove the dressing or tape.  Disconnect the tubing from the catheter.  Remove the catheter, if you have a peripheral IV.  Apply pressure over the IV insertion site to stop bleeding, then cover the area with a bandage (dressing). If you have an implanted port, PICC, non-tunneled, or tunneled catheter, your health care team may leave the catheter in place. This depends on your treatment, your medical condition, and what type of catheter you have. The procedure may vary among health care providers and hospitals. What can I expect after the procedure?  Your blood pressure, heart rate, breathing rate, and blood oxygen level may be monitored. Follow these instructions at home:  Take over-the-counter and prescription medicines only as told by your health care provider.  Change or remove any dressings only as told by your health care provider.  Return to your normal activities as told by your health care provider. Ask your health care provider what activities are safe for you.  Do not take baths, swim, or use a hot tub until your health care provider approves. Ask your health care provider if you may take showers.  Check your IV insertion site every day for signs of infection. Check for: ? Redness, swelling, or pain. ? Fluid or blood. If fluid or blood drains from your IV site, use your hands to press down firmly on the area for a minute or two. Doing this should stop the bleeding. ? Warmth. ? Pus or a bad smell.  Keep all follow-up visits as told by your health care provider. This is important. Contact a health care provider if:  You have signs of infection around your IV site.  You have a fever or chills.  You have fluid or blood coming from your IV site that does not stop after you apply pressure to the site.  You have itchy skin.  You have a rash.  You have red, itchy, swollen patches of skin (hives). Get help right away if:  You  have trouble breathing.  You have chest pain. These symptoms may represent a serious problem that is an emergency. Do not wait to see if the symptoms will go away. Get medical help right away. Call your local emergency services (911 in the [place] Do not drive yourself to the hospital. Summary  IV (intravenous) infusion therapy is a type of treatment to deliver a liquid substance (infusion) directly into a vein.  Check your IV insertion site every day for signs of infection, such as redness or swelling.  Change or remove bandages (dressings) only as told by your health care provider.  Call your health care provider if you notice any signs of infection around your IV site or have reactions to your IV therapy. This information is not intended to replace advice given to you by your health care provider. Make sure you discuss any questions you have with your health care provider.   Document Revised: 10/02/2019 Document Reviewed: 08/06/2017 Elsevier Patient Education  2020 [employer]  

## 2020-06-17 NOTE — Discharge Instructions (Addendum)
Follow discharge care instruction and notify her treating doctor of your ER visit.  Advised him this rash did not appear until after he took her first IV injection.  Return back to ED if condition worsens.

## 2020-06-17 NOTE — ED Notes (Signed)
First Nurse Note: Pt checking in for possible allergic reaction. Pt has generalized rash and itching. Pt is in NAD.

## 2020-06-21 ENCOUNTER — Encounter: Payer: Self-pay | Admitting: Infectious Diseases

## 2020-06-21 ENCOUNTER — Other Ambulatory Visit
Admission: RE | Admit: 2020-06-21 | Discharge: 2020-06-21 | Disposition: A | Discharge status: Home / Self Care | Payer: Medicaid Other | Attending: Infectious Diseases | Admitting: Infectious Diseases

## 2020-06-21 ENCOUNTER — Ambulatory Visit: Payer: Medicaid Other | Attending: Infectious Diseases | Admitting: Infectious Diseases

## 2020-06-21 ENCOUNTER — Other Ambulatory Visit: Payer: Self-pay

## 2020-06-21 VITALS — BP 154/95 | HR 52 | Temp 97.8°F | Resp 16 | Ht 69.0 in | Wt 198.0 lb

## 2020-06-21 DIAGNOSIS — F17210 Nicotine dependence, cigarettes, uncomplicated: Secondary | ICD-10-CM | POA: Diagnosis not present

## 2020-06-21 DIAGNOSIS — T8450XD Infection and inflammatory reaction due to unspecified internal joint prosthesis, subsequent encounter: Secondary | ICD-10-CM | POA: Insufficient documentation

## 2020-06-21 DIAGNOSIS — F419 Anxiety disorder, unspecified: Secondary | ICD-10-CM | POA: Insufficient documentation

## 2020-06-21 DIAGNOSIS — Z21 Asymptomatic human immunodeficiency virus [HIV] infection status: Secondary | ICD-10-CM | POA: Diagnosis not present

## 2020-06-21 DIAGNOSIS — Z8249 Family history of ischemic heart disease and other diseases of the circulatory system: Secondary | ICD-10-CM | POA: Insufficient documentation

## 2020-06-21 DIAGNOSIS — Z79899 Other long term (current) drug therapy: Secondary | ICD-10-CM | POA: Insufficient documentation

## 2020-06-21 DIAGNOSIS — T8451XD Infection and inflammatory reaction due to internal right hip prosthesis, subsequent encounter: Secondary | ICD-10-CM

## 2020-06-21 DIAGNOSIS — I440 Atrioventricular block, first degree: Secondary | ICD-10-CM | POA: Insufficient documentation

## 2020-06-21 DIAGNOSIS — Z888 Allergy status to other drugs, medicaments and biological substances status: Secondary | ICD-10-CM | POA: Insufficient documentation

## 2020-06-21 DIAGNOSIS — B1920 Unspecified viral hepatitis C without hepatic coma: Secondary | ICD-10-CM

## 2020-06-21 DIAGNOSIS — I10 Essential (primary) hypertension: Secondary | ICD-10-CM | POA: Insufficient documentation

## 2020-06-21 DIAGNOSIS — M00251 Other streptococcal arthritis, right hip: Secondary | ICD-10-CM | POA: Diagnosis not present

## 2020-06-21 DIAGNOSIS — Z8619 Personal history of other infectious and parasitic diseases: Secondary | ICD-10-CM | POA: Insufficient documentation

## 2020-06-21 DIAGNOSIS — F319 Bipolar disorder, unspecified: Secondary | ICD-10-CM | POA: Diagnosis not present

## 2020-06-21 DIAGNOSIS — J309 Allergic rhinitis, unspecified: Secondary | ICD-10-CM | POA: Insufficient documentation

## 2020-06-21 DIAGNOSIS — K219 Gastro-esophageal reflux disease without esophagitis: Secondary | ICD-10-CM | POA: Insufficient documentation

## 2020-06-21 DIAGNOSIS — Z96641 Presence of right artificial hip joint: Secondary | ICD-10-CM | POA: Diagnosis not present

## 2020-06-21 DIAGNOSIS — B954 Other streptococcus as the cause of diseases classified elsewhere: Secondary | ICD-10-CM | POA: Insufficient documentation

## 2020-06-21 DIAGNOSIS — T8451XA Infection and inflammatory reaction due to internal right hip prosthesis, initial encounter: Secondary | ICD-10-CM | POA: Diagnosis not present

## 2020-06-21 LAB — CBC WITH DIFFERENTIAL/PLATELET
Abs Immature Granulocytes: 0.01 10*3/uL (ref 0.00–0.07)
Basophils Absolute: 0 10*3/uL (ref 0.0–0.1)
Basophils Relative: 0 %
Eosinophils Absolute: 0.1 10*3/uL (ref 0.0–0.5)
Eosinophils Relative: 2 %
HCT: 40.1 % (ref 39.0–52.0)
Hemoglobin: 13.7 g/dL (ref 13.0–17.0)
Immature Granulocytes: 0 %
Lymphocytes Relative: 43 %
Lymphs Abs: 2 10*3/uL (ref 0.7–4.0)
MCH: 32.4 pg (ref 26.0–34.0)
MCHC: 34.2 g/dL (ref 30.0–36.0)
MCV: 94.8 fL (ref 80.0–100.0)
Monocytes Absolute: 0.4 10*3/uL (ref 0.1–1.0)
Monocytes Relative: 8 %
Neutro Abs: 2.2 10*3/uL (ref 1.7–7.7)
Neutrophils Relative %: 47 %
Platelets: 236 10*3/uL (ref 150–400)
RBC: 4.23 MIL/uL (ref 4.22–5.81)
RDW: 13.2 % (ref 11.5–15.5)
WBC: 4.6 10*3/uL (ref 4.0–10.5)
nRBC: 0 % (ref 0.0–0.2)

## 2020-06-21 LAB — COMPREHENSIVE METABOLIC PANEL
ALT: 11 U/L (ref 0–44)
AST: 17 U/L (ref 15–41)
Albumin: 4 g/dL (ref 3.5–5.0)
Alkaline Phosphatase: 112 U/L (ref 38–126)
Anion gap: 7 (ref 5–15)
BUN: 20 mg/dL (ref 6–20)
CO2: 25 mmol/L (ref 22–32)
Calcium: 8.8 mg/dL — ABNORMAL LOW (ref 8.9–10.3)
Chloride: 105 mmol/L (ref 98–111)
Creatinine, Ser: 1.26 mg/dL — ABNORMAL HIGH (ref 0.61–1.24)
GFR calc Af Amer: >60 mL/min (ref >60)
GFR calc non Af Amer: >60 mL/min (ref >60)
Glucose, Bld: 105 mg/dL — ABNORMAL HIGH (ref 70–99)
Potassium: 4.3 mmol/L (ref 3.5–5.1)
Sodium: 137 mmol/L (ref 135–145)
Total Bilirubin: 0.9 mg/dL (ref 0.3–1.2)
Total Protein: 8 g/dL (ref 6.5–8.1)

## 2020-06-21 LAB — C-REACTIVE PROTEIN: CRP: 0.6 mg/dL (ref <1.0)

## 2020-06-21 LAB — SEDIMENTATION RATE: Sed Rate: 6 mm/hr (ref 0–20)

## 2020-06-21 NOTE — Patient Instructions (Addendum)
You are here for follow up of rt hip infection. Last week after you took the dalbavancin you had itching and went to ED . Looks like the itching resolved with benadryl- you have also been given a prescription for atarax by the ED. If you have not filled it you can take benadryl 25 mg oral tablet an hour before the injection
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
## 2020-06-23 DIAGNOSIS — B20 Human immunodeficiency virus [HIV] disease: Principal | ICD-10-CM

## 2020-06-24 ENCOUNTER — Other Ambulatory Visit: Payer: Self-pay

## 2020-06-24 ENCOUNTER — Ambulatory Visit
Admission: RE | Admit: 2020-06-24 | Discharge: 2020-06-24 | Disposition: A | Discharge status: Home / Self Care | Payer: Medicaid Other | Source: Ambulatory Visit | Attending: Infectious Diseases | Admitting: Infectious Diseases

## 2020-06-24 DIAGNOSIS — T8453XA Infection and inflammatory reaction due to internal right knee prosthesis, initial encounter: Secondary | ICD-10-CM | POA: Diagnosis not present

## 2020-06-24 MED ORDER — DEXTROSE 5 % IV SOLN
500.0000 mg | Freq: Once | INTRAVENOUS | Status: AC
  Administered 2020-06-24: 500 mg via INTRAVENOUS
  Filled 2020-06-24: qty 25

## 2020-06-24 MED ORDER — DEXTROSE 5 % IV SOLN
500.0000 mg | Freq: Once | INTRAVENOUS | Status: DC
  Filled 2020-06-24: qty 25

## 2020-06-24 NOTE — Progress Notes (Signed)
Pt noted to have labs drawn on 7/6, requires weekly labs. Pt requesting to not have lab draw today '' i'm afraid of needles.''  Notified Dr. Rivka Safer , and patient does not need labs today, can wait until 7/16 visit next Friday. Patient verbalized understanding.

## 2020-06-27 LAB — FUNGUS CULTURE WITH STAIN

## 2020-06-27 LAB — FUNGUS CULTURE RESULT

## 2020-06-27 LAB — FUNGAL ORGANISM REFLEX

## 2020-06-29 ENCOUNTER — Institutional Professional Consult (permissible substitution): Admit: 2020-06-29 | Payer: MEDICAID | Attending: Registered" | Primary: Registered"

## 2020-07-01 ENCOUNTER — Ambulatory Visit
Admission: RE | Admit: 2020-07-01 | Discharge: 2020-07-01 | Disposition: A | Discharge status: Home / Self Care | Payer: Medicaid Other | Source: Ambulatory Visit | Attending: Infectious Diseases | Admitting: Infectious Diseases

## 2020-07-01 ENCOUNTER — Other Ambulatory Visit: Payer: Self-pay

## 2020-07-01 DIAGNOSIS — T8451XA Infection and inflammatory reaction due to internal right hip prosthesis, initial encounter: Secondary | ICD-10-CM | POA: Insufficient documentation

## 2020-07-01 LAB — CBC WITH DIFFERENTIAL/PLATELET
Abs Immature Granulocytes: 0.01 10*3/uL (ref 0.00–0.07)
Basophils Absolute: 0 10*3/uL (ref 0.0–0.1)
Basophils Relative: 1 %
Eosinophils Absolute: 0.1 10*3/uL (ref 0.0–0.5)
Eosinophils Relative: 1 %
HCT: 39.8 % (ref 39.0–52.0)
Hemoglobin: 13.9 g/dL (ref 13.0–17.0)
Immature Granulocytes: 0 %
Lymphocytes Relative: 37 %
Lymphs Abs: 1.7 10*3/uL (ref 0.7–4.0)
MCH: 33 pg (ref 26.0–34.0)
MCHC: 34.9 g/dL (ref 30.0–36.0)
MCV: 94.5 fL (ref 80.0–100.0)
Monocytes Absolute: 0.5 10*3/uL (ref 0.1–1.0)
Monocytes Relative: 10 %
Neutro Abs: 2.4 10*3/uL (ref 1.7–7.7)
Neutrophils Relative %: 51 %
Platelets: 232 10*3/uL (ref 150–400)
RBC: 4.21 MIL/uL — ABNORMAL LOW (ref 4.22–5.81)
RDW: 13.3 % (ref 11.5–15.5)
WBC: 4.7 10*3/uL (ref 4.0–10.5)
nRBC: 0 % (ref 0.0–0.2)

## 2020-07-01 LAB — COMPREHENSIVE METABOLIC PANEL
ALT: 35 U/L (ref 0–44)
AST: 51 U/L — ABNORMAL HIGH (ref 15–41)
Albumin: 4.1 g/dL (ref 3.5–5.0)
Alkaline Phosphatase: 98 U/L (ref 38–126)
Anion gap: 9 (ref 5–15)
BUN: 21 mg/dL — ABNORMAL HIGH (ref 6–20)
CO2: 23 mmol/L (ref 22–32)
Calcium: 9.1 mg/dL (ref 8.9–10.3)
Chloride: 105 mmol/L (ref 98–111)
Creatinine, Ser: 1.07 mg/dL (ref 0.61–1.24)
GFR calc Af Amer: >60 mL/min (ref >60)
GFR calc non Af Amer: >60 mL/min (ref >60)
Glucose, Bld: 129 mg/dL — ABNORMAL HIGH (ref 70–99)
Potassium: 3.9 mmol/L (ref 3.5–5.1)
Sodium: 137 mmol/L (ref 135–145)
Total Bilirubin: 1.3 mg/dL — ABNORMAL HIGH (ref 0.3–1.2)
Total Protein: 8.2 g/dL — ABNORMAL HIGH (ref 6.5–8.1)

## 2020-07-01 LAB — SEDIMENTATION RATE: Sed Rate: 8 mm/hr (ref 0–20)

## 2020-07-01 LAB — C-REACTIVE PROTEIN: CRP: 0.5 mg/dL (ref <1.0)

## 2020-07-01 MED ORDER — DEXTROSE 5 % IV SOLN
500.0000 mg | Freq: Once | INTRAVENOUS | Status: AC
  Administered 2020-07-01: 500 mg via INTRAVENOUS
  Filled 2020-07-01: qty 25

## 2020-07-01 NOTE — Discharge Instructions (Signed)
IV Infusion Therapy IV (intravenous) infusion therapy is a type of treatment to deliver a liquid substance (infusion) directly into a vein. You may have IV infusion therapy to receive an infusion of:  Fluids.  Medicines.  Nutrition.  Chemotherapy. This is the use of medicines to stop or slow the growth of cancer cells.  Blood or blood products.  X-ray dye that is given before an imaging procedure, like an MRI or CT scan. Tell a health care provider about:  Any allergies you have.  All medicines you are taking, including vitamins, herbs, eye drops, creams, and over-the-counter medicines.  Any problems you or family members have had with anesthetic medicines or X-ray dyes.  Any blood disorders you have.  Any surgeries you have had.  Any medical conditions you have.  Whether you are pregnant or may be pregnant.  Any history of IV drug use.  Any history of health care providers not being able to find a vein for an IV or blood draw. What are the risks? Generally, this is a safe procedure. However, problems may occur, including:  Pain.  Bruising.  Bleeding.  Infection.  Failure to place the catheter due to inability to find a vein.  Leaking or blockage of the catheter (infiltration).  Damage to blood vessels or nerves.  Allergic reactions to medicines or dyes.  A blood clot. What happens before the procedure?  Follow instructions from your health care provider about eating or drinking restrictions.  Ask your health care provider about changing or stopping your regular medicines. This is especially important if you are taking diabetes medicines or blood thinners.  Learn as much as you can about your treatment. Ask your health care provider for reliable resources, such as websites, books, videos, and people, to help you learn about the treatment you will be having. What happens during the procedure?     IV infusion therapy starts with a procedure to place a  small, thin tube (catheter) into a vein. An IV tube will be attached to the catheter to allow the infusion to flow into your bloodstream. Your catheter may be placed:  Into a vein that is usually in the elbow, forearm, or back of the hand (peripheral IV catheter). This type of catheter may need to be inserted into a vein each time you get an infusion.  Into a vein near your elbow (midline catheter or PICC). This type of catheter may stay in place for weeks or months at a time so you can receive repeated infusions through it.  Into a vein near your neck that leads to your heart (non-tunneled catheter). This type of catheter is only used for short amounts of time because it can cause infection.  Through the skin of your chest and into a large vein that leads to your heart (tunneled catheter). This type of catheter may stay in your body for months or years.  So that it connects to an implanted port. An implanted port is a device that is surgically inserted under the skin of the chest to provide long-term IV access. The catheter will connect the port to a large vein in the chest or upper arm. ? A port may be kept in place for many months or years. ? Each time you have an infusion, a needle will be inserted through your skin to connect the catheter to the port. After your catheter is placed, your health care team will lower your risk of infection by:  Washing or sanitizing their   hands.  Cleaning the IV site with a germ-killing (antiseptic) solution. To start the infusion, your health care provider will:  Attach the IV tubing to your catheter.  Use a tape or an adhesive bandage (dressing) to hold the catheter and tubing in place against your skin.  Use an IV pump to control the flow of the IV infusion, if needed. During the infusion, your health care provider will check the area to make sure:  There is no swelling.  Your IV infusion is flowing through the catheter properly. After the  infusion, your health care provider will:  Remove the dressing or tape.  Disconnect the tubing from the catheter.  Remove the catheter, if you have a peripheral IV.  Apply pressure over the IV insertion site to stop bleeding, then cover the area with a bandage (dressing). If you have an implanted port, PICC, non-tunneled, or tunneled catheter, your health care team may leave the catheter in place. This depends on your treatment, your medical condition, and what type of catheter you have. The procedure may vary among health care providers and hospitals. What can I expect after the procedure?  Your blood pressure, heart rate, breathing rate, and blood oxygen level may be monitored. Follow these instructions at home:  Take over-the-counter and prescription medicines only as told by your health care provider.  Change or remove any dressings only as told by your health care provider.  Return to your normal activities as told by your health care provider. Ask your health care provider what activities are safe for you.  Do not take baths, swim, or use a hot tub until your health care provider approves. Ask your health care provider if you may take showers.  Check your IV insertion site every day for signs of infection. Check for: ? Redness, swelling, or pain. ? Fluid or blood. If fluid or blood drains from your IV site, use your hands to press down firmly on the area for a minute or two. Doing this should stop the bleeding. ? Warmth. ? Pus or a bad smell.  Keep all follow-up visits as told by your health care provider. This is important. Contact a health care provider if:  You have signs of infection around your IV site.  You have a fever or chills.  You have fluid or blood coming from your IV site that does not stop after you apply pressure to the site.  You have itchy skin.  You have a rash.  You have red, itchy, swollen patches of skin (hives). Get help right away if:  You  have trouble breathing.  You have chest pain. These symptoms may represent a serious problem that is an emergency. Do not wait to see if the symptoms will go away. Get medical help right away. Call your local emergency services (911 in the [place] Do not drive yourself to the hospital. Summary  IV (intravenous) infusion therapy is a type of treatment to deliver a liquid substance (infusion) directly into a vein.  Check your IV insertion site every day for signs of infection, such as redness or swelling.  Change or remove bandages (dressings) only as told by your health care provider.  Call your health care provider if you notice any signs of infection around your IV site or have reactions to your IV therapy. This information is not intended to replace advice given to you by your health care provider. Make sure you discuss any questions you have with your health care provider.   Document Revised: 10/02/2019 Document Reviewed: 08/06/2017 Elsevier Patient Education  2020 [employer]  

## 2020-07-01 NOTE — OR Nursing (Signed)
Pt. States he took benadryl one hour ago.

## 2020-07-05 ENCOUNTER — Ambulatory Visit: Payer: Medicaid Other | Admitting: Infectious Diseases

## 2020-07-08 ENCOUNTER — Ambulatory Visit
Admission: RE | Admit: 2020-07-08 | Discharge: 2020-07-08 | Disposition: A | Discharge status: Home / Self Care | Payer: Medicaid Other | Source: Ambulatory Visit | Attending: Infectious Diseases | Admitting: Infectious Diseases

## 2020-07-08 ENCOUNTER — Other Ambulatory Visit: Payer: Self-pay

## 2020-07-08 VITALS — BP 120/78 | HR 61 | Temp 97.9°F | Resp 16

## 2020-07-08 DIAGNOSIS — M009 Pyogenic arthritis, unspecified: Secondary | ICD-10-CM | POA: Diagnosis not present

## 2020-07-08 LAB — COMPREHENSIVE METABOLIC PANEL
ALT: 19 U/L (ref 0–44)
AST: 20 U/L (ref 15–41)
Albumin: 4 g/dL (ref 3.5–5.0)
Alkaline Phosphatase: 93 U/L (ref 38–126)
Anion gap: 8 (ref 5–15)
BUN: 17 mg/dL (ref 6–20)
CO2: 25 mmol/L (ref 22–32)
Calcium: 9 mg/dL (ref 8.9–10.3)
Chloride: 103 mmol/L (ref 98–111)
Creatinine, Ser: 1.16 mg/dL (ref 0.61–1.24)
GFR calc Af Amer: >60 mL/min (ref >60)
GFR calc non Af Amer: >60 mL/min (ref >60)
Glucose, Bld: 99 mg/dL (ref 70–99)
Potassium: 4.1 mmol/L (ref 3.5–5.1)
Sodium: 136 mmol/L (ref 135–145)
Total Bilirubin: 0.9 mg/dL (ref 0.3–1.2)
Total Protein: 7.7 g/dL (ref 6.5–8.1)

## 2020-07-08 LAB — CBC
HCT: 40.3 % (ref 39.0–52.0)
Hemoglobin: 13.9 g/dL (ref 13.0–17.0)
MCH: 33 pg (ref 26.0–34.0)
MCHC: 34.5 g/dL (ref 30.0–36.0)
MCV: 95.7 fL (ref 80.0–100.0)
Platelets: 257 10*3/uL (ref 150–400)
RBC: 4.21 MIL/uL — ABNORMAL LOW (ref 4.22–5.81)
RDW: 13.3 % (ref 11.5–15.5)
WBC: 3.7 10*3/uL — ABNORMAL LOW (ref 4.0–10.5)
nRBC: 0 % (ref 0.0–0.2)

## 2020-07-08 LAB — C-REACTIVE PROTEIN: CRP: 0.5 mg/dL (ref <1.0)

## 2020-07-08 LAB — SEDIMENTATION RATE: Sed Rate: 7 mm/hr (ref 0–20)

## 2020-07-08 MED ORDER — DEXTROSE 5 % IV SOLN: 1000.0000 mg | Freq: Once | INTRAVENOUS | Status: DC

## 2020-07-08 MED ORDER — DEXTROSE 5 % IV SOLN
500.0000 mg | Freq: Once | INTRAVENOUS | Status: AC
  Administered 2020-07-08: 500 mg via INTRAVENOUS
  Filled 2020-07-08: qty 25

## 2020-07-08 NOTE — Discharge Instructions (Signed)
Follow up with Infection Prevention, Dr. Rivka Safer.

## 2020-07-08 NOTE — Progress Notes (Signed)
Infusion completed, tolerated well.  Will follow up with Dr. Rivka Safer.

## 2020-07-12 ENCOUNTER — Emergency Department: Payer: Medicaid Other

## 2020-07-12 ENCOUNTER — Other Ambulatory Visit: Payer: Self-pay | Admitting: Infectious Diseases

## 2020-07-12 ENCOUNTER — Emergency Department
Admission: EM | Admit: 2020-07-12 | Discharge: 2020-07-13 | Disposition: A | Discharge status: Home / Self Care | Payer: Medicaid Other | Attending: Student in an Organized Health Care Education/Training Program | Admitting: Student in an Organized Health Care Education/Training Program

## 2020-07-12 ENCOUNTER — Encounter: Payer: Self-pay | Admitting: Emergency Medicine

## 2020-07-12 ENCOUNTER — Other Ambulatory Visit: Payer: Self-pay

## 2020-07-12 DIAGNOSIS — J45909 Unspecified asthma, uncomplicated: Secondary | ICD-10-CM | POA: Diagnosis not present

## 2020-07-12 DIAGNOSIS — R0789 Other chest pain: Secondary | ICD-10-CM | POA: Insufficient documentation

## 2020-07-12 DIAGNOSIS — F17210 Nicotine dependence, cigarettes, uncomplicated: Secondary | ICD-10-CM | POA: Diagnosis not present

## 2020-07-12 DIAGNOSIS — Z79899 Other long term (current) drug therapy: Secondary | ICD-10-CM | POA: Diagnosis not present

## 2020-07-12 DIAGNOSIS — Z7951 Long term (current) use of inhaled steroids: Secondary | ICD-10-CM | POA: Diagnosis not present

## 2020-07-12 DIAGNOSIS — I2510 Atherosclerotic heart disease of native coronary artery without angina pectoris: Secondary | ICD-10-CM | POA: Diagnosis not present

## 2020-07-12 DIAGNOSIS — I10 Essential (primary) hypertension: Secondary | ICD-10-CM | POA: Diagnosis not present

## 2020-07-12 HISTORY — DX: Atherosclerotic heart disease of native coronary artery without angina pectoris: I25.10

## 2020-07-12 LAB — BASIC METABOLIC PANEL
Anion gap: 8 (ref 5–15)
BUN: 15 mg/dL (ref 6–20)
CO2: 24 mmol/L (ref 22–32)
Calcium: 8.7 mg/dL — ABNORMAL LOW (ref 8.9–10.3)
Chloride: 106 mmol/L (ref 98–111)
Creatinine, Ser: 1.64 mg/dL — ABNORMAL HIGH (ref 0.61–1.24)
GFR calc Af Amer: 54 mL/min — ABNORMAL LOW (ref >60)
GFR calc non Af Amer: 46 mL/min — ABNORMAL LOW (ref >60)
Glucose, Bld: 100 mg/dL — ABNORMAL HIGH (ref 70–99)
Potassium: 4.1 mmol/L (ref 3.5–5.1)
Sodium: 138 mmol/L (ref 135–145)

## 2020-07-12 LAB — CBC
HCT: 39.5 % (ref 39.0–52.0)
Hemoglobin: 13.5 g/dL (ref 13.0–17.0)
MCH: 33.3 pg (ref 26.0–34.0)
MCHC: 34.2 g/dL (ref 30.0–36.0)
MCV: 97.3 fL (ref 80.0–100.0)
Platelets: 250 10*3/uL (ref 150–400)
RBC: 4.06 MIL/uL — ABNORMAL LOW (ref 4.22–5.81)
RDW: 13.5 % (ref 11.5–15.5)
WBC: 3.6 10*3/uL — ABNORMAL LOW (ref 4.0–10.5)
nRBC: 0 % (ref 0.0–0.2)

## 2020-07-12 LAB — TROPONIN I (HIGH SENSITIVITY)
Troponin I (High Sensitivity): 6 ng/L (ref <18)
Troponin I (High Sensitivity): 6 ng/L (ref <18)

## 2020-07-12 LAB — ACID FAST CULTURE WITH REFLEXED SENSITIVITIES (MYCOBACTERIA): Acid Fast Culture: NEGATIVE

## 2020-07-12 LAB — FIBRIN DERIVATIVES D-DIMER (ARMC ONLY): Fibrin derivatives D-dimer (ARMC): 1076.8 ng/mL (FEU) — ABNORMAL HIGH (ref 0.00–499.00)

## 2020-07-12 LAB — GLUCOSE, CAPILLARY: Glucose-Capillary: 81 mg/dL (ref 70–99)

## 2020-07-12 MED ORDER — CEPHALEXIN 500 MG PO CAPS: 500.0000 mg | ORAL_CAPSULE | Freq: Four times a day (QID) | ORAL | 1 refills | Status: DC

## 2020-07-12 MED ORDER — SODIUM CHLORIDE 0.9 % IV BOLUS
500.0000 mL | Freq: Once | INTRAVENOUS | Status: AC
  Administered 2020-07-12: 500 mL via INTRAVENOUS

## 2020-07-12 MED ORDER — OXYCODONE-ACETAMINOPHEN 5-325 MG PO TABS
1.0000 | ORAL_TABLET | Freq: Once | ORAL | Status: AC
  Administered 2020-07-12: 1 via ORAL
  Filled 2020-07-12: qty 1

## 2020-07-12 NOTE — ED Provider Notes (Signed)
[facility] Emergency Department Provider Note    First MD Initiated Contact with Patient 07/12/20 2232     (approximate)  I have reviewed the triage vital signs and the nursing notes.   HISTORY  Chief Complaint Chest Pain    HPI Roy Oskey Sr. is a 54 y.o. male with the below listed past medical history presents to the ER for 3 days of left-sided chest pain that is worse with taking deep inspiration.  Some reproducibility with palpation of the left anterior chest.  Denies any pressure.  Does feel short winded.  Denies any diaphoresis.  No recent fevers.  Is currently on antibiotics for recent hip infection.  Is been compliant with his medications.    Past Medical History:  Diagnosis Date  . AIDS (acquired immune deficiency syndrome) (HCC)   . Arthritis   . Asthma   . Bipolar disorder (HCC)   . Bronchitis   . Complication of anesthesia   . Coronary artery disease   . Depression   . Dysrhythmia    1st degree heart block/ brady  . GERD (gastroesophageal reflux disease)   . Hepatitis C    treated  . HIV (human immunodeficiency virus infection) (HCC)   . HTN (hypertension)   . PONV (postoperative nausea and vomiting)    Family History  Problem Relation Age of Onset  . Cancer Brother   . Uterine cancer Mother   . CAD Mother   . Hypertension Mother   . Hyperlipidemia Mother    Past Surgical History:  Procedure Laterality Date  . APPLICATION OF WOUND VAC Right 09/01/2019   Procedure: APPLICATION OF WOUND VAC;  Surgeon: Kennedy Bucker, MD;  Location: [facility] ORS;  Service: Orthopedics;  Laterality: Right;  Serial # [id number]  . HERNIA REPAIR Left    inguinal  . TOE SURGERY    . TOE SURGERY Right   . TOTAL HIP ARTHROPLASTY Right 09/01/2019   Procedure: TOTAL HIP ARTHROPLASTY ANTERIOR APPROACH;  Surgeon: Kennedy Bucker, MD;  Location: [facility] ORS;  Service: Orthopedics;  Laterality: Right;   Patient Active Problem List   Diagnosis Date Noted    . Allergic rhinitis 06/21/2020  . GERD (gastroesophageal reflux disease) 06/21/2020  . 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  . Bipolar disorder (manic depression) (HCC) 05/18/2020  . Adjustment disorder with mixed anxiety and depressed mood 05/18/2020  . Arthritis pain, hip 05/18/2020  . 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 Angioedema 07/24/2019  . MDD (major depressive disorder), recurrent episode (HCC) 03/23/2019  . MDD (major depressive disorder), recurrent episode, moderate (HCC) 02/19/2019  . MDD (major depressive disorder), recurrent severe, without psychosis (HCC) 02/19/2019  . Chest pain 05/27/2018  . Major depressive disorder, recurrent severe without psychotic features (HCC) 05/27/2018  . ARF (acute renal failure) (HCC) 04/08/2018  . Malingering 03/07/2017  . Cocaine dependence (HCC) 02/19/2017  . Substance induced mood disorder (HCC) 02/19/2017  . Alcohol use disorder, severe, dependence (HCC) 12/27/2016  . HIV disease (HCC) 10/26/2016  . HTN (hypertension) 10/26/2016  . Dyslipidemia 10/26/2016  . BPH (benign prostatic hyperplasia) 10/26/2016  . Constipation 10/26/2016  . Acquired hallux rigidus of right foot 07/16/2016  . Arthritis 10/25/2014  . Genital warts 08/25/2013      Prior to Admission medications   Medication Sig Start Date End Date Taking? Authorizing Provider  albuterol (VENTOLIN HFA) 108 (90 Base) MCG/ACT inhaler Inhale 1-2 puffs into the lungs every 4 (four) hours as needed for wheezing or shortness of  breath. 05/23/20   Clapacs, Jackquline Denmark, MD  atorvastatin (LIPITOR) 10 MG tablet Take 1 tablet (10 mg total) by mouth daily at 6 PM. Patient not taking: Reported on 07/01/2020 05/23/20   Clapacs, Jackquline Denmark, MD  bictegravir-emtricitabine-tenofovir AF (BIKTARVY) 50-200-25 MG TABS tablet Take 1 tablet by mouth daily. Patient not taking: Reported on 07/01/2020 05/24/20   Clapacs, Jackquline Denmark, MD  cephALEXin (KEFLEX) 500 MG capsule Take 1 capsule (500 mg total) by mouth 4  (four) times daily. 07/12/20   Lynn Ito, MD  citalopram (CELEXA) 40 MG tablet Take 1 tablet (40 mg total) by mouth daily. Patient not taking: Reported on 07/01/2020 05/24/20   Clapacs, Jackquline Denmark, MD  gabapentin (NEURONTIN) 300 MG capsule Take 1 capsule (300 mg total) by mouth 2 (two) times daily. Patient not taking: Reported on 07/01/2020 05/23/20   Clapacs, Jackquline Denmark, MD  hydrOXYzine (ATARAX/VISTARIL) 50 MG tablet Take 1 tablet (50 mg total) by mouth 3 (three) times daily as needed. Patient not taking: Reported on 07/01/2020 06/17/20   Joni Reining, PA-C  oxyCODONE (OXY IR/ROXICODONE) 5 MG immediate release tablet Take by mouth. Patient not taking: Reported on 06/21/2020 06/09/20   [provider]  traMADol (ULTRAM) 50 MG tablet Take 1 tablet (50 mg total) by mouth every 6 (six) hours as needed. Patient not taking: Reported on 07/01/2020 06/01/20   Evon Slack, PA-C  traZODone (DESYREL) 300 MG tablet Take 1 tablet (300 mg total) by mouth at bedtime as needed for sleep. Patient not taking: Reported on 07/01/2020 05/23/20   Clapacs, Jackquline Denmark, MD    Allergies Amlodipine, Lisinopril, Lactose, and Pollen extract    Social History Social History   Tobacco Use  . Smoking status: Current Every Day Smoker    Packs/day: 0.25    Types: Cigarettes  . Smokeless tobacco: Never Used  Vaping Use  . Vaping Use: Never used  Substance Use Topics  . Alcohol use: Yes    Alcohol/week: 84.0 standard drinks    Types: 84 Cans of beer per week    Comment: daily  . Drug use: Yes    Types: Cocaine, "Crack" cocaine    Comment: states last use 07/10/2020    Review of Systems Patient denies headaches, rhinorrhea, blurry vision, numbness, shortness of breath, chest pain, edema, cough, abdominal pain, nausea, vomiting, diarrhea, dysuria, fevers, rashes or hallucinations unless otherwise stated above in HPI. ____________________________________________   PHYSICAL EXAM:  VITAL SIGNS: Vitals:    07/12/20 2245 07/12/20 2246  BP:  (!) 138/87  Pulse: 54 53  Resp: 19 17  Temp:    SpO2: 100% 100%    Constitutional: Alert and oriented.  Eyes: Conjunctivae are normal.  Head: Atraumatic. Nose: No congestion/rhinnorhea. Mouth/Throat: Mucous membranes are moist.   Neck: No stridor. Painless ROM.  Cardiovascular: Normal rate, regular rhythm. Grossly normal heart sounds.  Good peripheral circulation. Respiratory: Normal respiratory effort.  No retractions. Lungs CTAB. Gastrointestinal: Soft and nontender. No distention. No abdominal bruits. No CVA tenderness. Genitourinary:  Musculoskeletal: No lower extremity tenderness nor edema.  No joint effusions. Neurologic:  Normal speech and language. No gross focal neurologic deficits are appreciated. No facial droop Skin:  Skin is warm, dry and intact. No rash noted. Psychiatric: Mood and affect are normal. Speech and behavior are normal.  ____________________________________________   LABS (all labs ordered are listed, but only abnormal results are displayed)  Results for orders placed or performed during the hospital encounter of 07/12/20 (from the past 24  hour(s))  Basic metabolic panel     Status: Abnormal   Collection Time: 07/12/20  3:11 PM  Result Value Ref Range   Sodium 138 135 - 145 mmol/L   Potassium 4.1 3.5 - 5.1 mmol/L   Chloride 106 98 - 111 mmol/L   CO2 24 22 - 32 mmol/L   Glucose, Bld 100 (H) 70 - 99 mg/dL   BUN 15 6 - 20 mg/dL   Creatinine, Ser 4.85 (H) 0.61 - 1.24 mg/dL   Calcium 8.7 (L) 8.9 - 10.3 mg/dL   GFR calc non Af Amer 46 (L) >60 mL/min   GFR calc Af Amer 54 (L) >60 mL/min   Anion gap 8 5 - 15  CBC     Status: Abnormal   Collection Time: 07/12/20  3:11 PM  Result Value Ref Range   WBC 3.6 (L) 4.0 - 10.5 K/uL   RBC 4.06 (L) 4.22 - 5.81 MIL/uL   Hemoglobin 13.5 13.0 - 17.0 g/dL   HCT 46.2 39 - 52 %   MCV 97.3 80.0 - 100.0 fL   MCH 33.3 26.0 - 34.0 pg   MCHC 34.2 30.0 - 36.0 g/dL   RDW 70.3 50.0 -  93.8 %   Platelets 250 150 - 400 K/uL   nRBC 0.0 0.0 - 0.2 %  Troponin I (High Sensitivity)     Status: None   Collection Time: 07/12/20  3:11 PM  Result Value Ref Range   Troponin I (High Sensitivity) 6 <18 ng/L  Glucose, capillary     Status: None   Collection Time: 07/12/20  4:07 PM  Result Value Ref Range   Glucose-Capillary 81 70 - 99 mg/dL  Troponin I (High Sensitivity)     Status: None   Collection Time: 07/12/20  5:27 PM  Result Value Ref Range   Troponin I (High Sensitivity) 6 <18 ng/L   ____________________________________________  EKG My review and personal interpretation at Time: 15:01   Indication: chest pain  Rate: 60  Rhythm: sinus with frequent PAC Axis: normal Other: normal intervals, no stemi criteria ____________________________________________  RADIOLOGY  I personally reviewed all radiographic images ordered to evaluate for the above acute complaints and reviewed radiology reports and findings.  These findings were personally discussed with the patient.  Please see medical record for radiology report.  ____________________________________________   PROCEDURES  Procedure(s) performed:  Procedures    Critical Care performed: no ____________________________________________   INITIAL IMPRESSION / ASSESSMENT AND PLAN / ED COURSE  Pertinent labs & imaging results that were available during my care of the patient were reviewed by me and considered in my medical decision making (see chart for details).   DDX: ACS, pericarditis, esophagitis, boerhaaves, pe, dissection, pna, bronchitis, costochondritis   Roy Kennedy Bohanon Sr. is a 54 y.o. who presents to the ED with chest discomfort as described above.  His EKG is nonischemic.  Serial troponins are negative.  Doubt infectious process.  Given his symptoms pleuritic chest pain shortness of breath will order D-dimer to further rule stratify for PE.  Patient be signed out to oncoming physician pending follow-up  D-dimer.  Have ordered some IV fluids for mild AKI.  He is otherwise nontoxic-appearing no acute distress.     The patient was evaluated in Emergency Department today for the symptoms described in the history of present illness. He/she was evaluated in the context of the global COVID-19 pandemic, which necessitated consideration that the patient might be at risk for infection with the SARS-CoV-2  virus that causes COVID-19. Institutional protocols and algorithms that pertain to the evaluation of patients at risk for COVID-19 are in a state of rapid change based on information released by regulatory bodies including the CDC and federal and state organizations. These policies and algorithms were followed during the patient's care in the ED.  As part of my medical decision making, I reviewed the following data within the electronic MEDICAL RECORD NUMBER Nursing notes reviewed and incorporated, Labs reviewed, notes from prior ED visits and [place] Controlled Substance Database   ____________________________________________   FINAL CLINICAL IMPRESSION(S) / ED DIAGNOSES  Final diagnoses:  Chest wall pain      NEW MEDICATIONS STARTED DURING THIS VISIT:  New Prescriptions   No medications on file     Note:  This document was prepared using Dragon voice recognition software and may include unintentional dictation errors.    Willy Eddy, MD 07/12/20 2249

## 2020-07-12 NOTE — ED Notes (Signed)
Pt felt like sugar dropping. Was 100 in labs and now 80. Gave pt crackers.

## 2020-07-12 NOTE — Progress Notes (Signed)
Pt has completed 6 weeks of IV for rt hip prosthetic infection- will now start him on PO keflex 500mg  Q6 for 2 more months. He will have a follow up appt with me in 1 month

## 2020-07-12 NOTE — ED Notes (Signed)
Pt given diet ginger ale with graham crackers & peanut butter at request

## 2020-07-12 NOTE — ED Notes (Addendum)
Pt denies diaphoresis and nausea. Resting calmly in bed. Reports dizziness while out in lobby. Denies dizziness currently. Reports in for L sided, upper CP. "Deep pain like a tooth ache or butcher knife" per pt. Hurts worse when pt takes a deep breath. Resp reg/unlabored currently. Skin dry. Reports has always had "night sweats". 100% RA. Report history of HTN and bradycardia; had been on BP meds and reports was taken off of them after having an "allergic reaction".

## 2020-07-12 NOTE — ED Triage Notes (Signed)
Pt presents via pov with c/o sharp central chest pain that began 3 days ago. Pt states pain increasingly has gotten worse. Pt endorses shortness of breath with chest pain. Pt recently diagnosed with CAD. Pt states pain increases with touch.

## 2020-07-13 ENCOUNTER — Emergency Department: Payer: Medicaid Other

## 2020-07-13 ENCOUNTER — Encounter: Payer: Self-pay | Admitting: Radiology

## 2020-07-13 MED ORDER — IOHEXOL 350 MG/ML SOLN
75.0000 mL | Freq: Once | INTRAVENOUS | Status: AC | PRN
  Administered 2020-07-13: 75 mL via INTRAVENOUS

## 2020-07-13 NOTE — ED Provider Notes (Signed)
I assumed care of the patient from Dr. Roxan Hockey at 11:00 PM recommendation follow-up on D-dimer.  [name] D-dimer 1076 and as such CT angiogram of the chest was performed which revealed no pulmonary emboli or any other acute intrathoracic pathology.   [name], MD 07/13/20 [phone]

## 2020-07-15 LAB — ACID FAST CULTURE WITH REFLEXED SENSITIVITIES (MYCOBACTERIA): Acid Fast Culture: NEGATIVE

## 2020-07-21 ENCOUNTER — Ambulatory Visit: Payer: Medicaid Other | Admitting: Infectious Diseases

## 2020-08-09 ENCOUNTER — Ambulatory Visit: Admit: 2020-08-09 | Payer: MEDICAID

## 2020-08-16 ENCOUNTER — Ambulatory Visit: Payer: Medicaid Other | Admitting: Infectious Diseases

## 2020-08-20 DIAGNOSIS — B20 Human immunodeficiency virus [HIV] disease: Principal | ICD-10-CM

## 2020-08-20 MED ORDER — BIKTARVY 50 MG-200 MG-25 MG TABLET
ORAL_TABLET | 2 refills | 0.00000 days
Start: 2020-08-20 — End: ?

## 2020-08-30 DIAGNOSIS — B20 Human immunodeficiency virus [HIV] disease: Principal | ICD-10-CM

## 2020-08-30 MED ORDER — BIKTARVY 50 MG-200 MG-25 MG TABLET
ORAL_TABLET | Freq: Every day | ORAL | 2 refills | 30.00000 days | Status: CP
Start: 2020-08-30 — End: 2020-11-28

## 2020-08-31 DIAGNOSIS — M1612 Unilateral primary osteoarthritis, left hip: Principal | ICD-10-CM

## 2020-09-09 ENCOUNTER — Ambulatory Visit: Admit: 2020-09-09 | Discharge: 2020-09-10 | Payer: MEDICAID

## 2020-09-21 ENCOUNTER — Ambulatory Visit: Admit: 2020-09-21 | Discharge: 2020-09-22 | Payer: MEDICAID | Attending: Registered" | Primary: Registered"

## 2020-09-21 ENCOUNTER — Ambulatory Visit: Admit: 2020-09-21 | Discharge: 2020-09-22 | Payer: MEDICAID

## 2020-09-21 DIAGNOSIS — B20 Human immunodeficiency virus [HIV] disease: Principal | ICD-10-CM

## 2020-09-21 DIAGNOSIS — M1611 Unilateral primary osteoarthritis, right hip: Principal | ICD-10-CM

## 2020-09-23 ENCOUNTER — Ambulatory Visit: Admit: 2020-09-23 | Payer: MEDICAID

## 2020-09-24 ENCOUNTER — Other Ambulatory Visit: Payer: Self-pay

## 2020-09-24 ENCOUNTER — Emergency Department
Admission: EM | Admit: 2020-09-24 | Discharge: 2020-09-24 | Disposition: A | Discharge status: Home / Self Care | Payer: Medicaid Other | Attending: Emergency Medicine | Admitting: Emergency Medicine

## 2020-09-24 ENCOUNTER — Emergency Department: Payer: Medicaid Other

## 2020-09-24 DIAGNOSIS — R109 Unspecified abdominal pain: Secondary | ICD-10-CM | POA: Insufficient documentation

## 2020-09-24 DIAGNOSIS — N390 Urinary tract infection, site not specified: Secondary | ICD-10-CM | POA: Insufficient documentation

## 2020-09-24 DIAGNOSIS — F17210 Nicotine dependence, cigarettes, uncomplicated: Secondary | ICD-10-CM | POA: Diagnosis not present

## 2020-09-24 DIAGNOSIS — Z202 Contact with and (suspected) exposure to infections with a predominantly sexual mode of transmission: Secondary | ICD-10-CM | POA: Insufficient documentation

## 2020-09-24 DIAGNOSIS — Z96641 Presence of right artificial hip joint: Secondary | ICD-10-CM | POA: Diagnosis not present

## 2020-09-24 DIAGNOSIS — Z21 Asymptomatic human immunodeficiency virus [HIV] infection status: Secondary | ICD-10-CM | POA: Insufficient documentation

## 2020-09-24 DIAGNOSIS — R300 Dysuria: Secondary | ICD-10-CM | POA: Diagnosis present

## 2020-09-24 LAB — URINALYSIS, ROUTINE W REFLEX MICROSCOPIC
Bilirubin Urine: NEGATIVE
Glucose, UA: NEGATIVE mg/dL
Ketones, ur: NEGATIVE mg/dL
Nitrite: POSITIVE — AB
Protein, ur: 100 mg/dL — AB
Specific Gravity, Urine: 1.014 (ref 1.005–1.030)
Squamous Epithelial / HPF: NONE SEEN (ref 0–5)
WBC, UA: >50 WBC/hpf — ABNORMAL HIGH (ref 0–5)
pH: 6 (ref 5.0–8.0)

## 2020-09-24 LAB — CBC WITH DIFFERENTIAL/PLATELET
Abs Immature Granulocytes: 0.03 10*3/uL (ref 0.00–0.07)
Basophils Absolute: 0 10*3/uL (ref 0.0–0.1)
Basophils Relative: 0 %
Eosinophils Absolute: 0.1 10*3/uL (ref 0.0–0.5)
Eosinophils Relative: 1 %
HCT: 42 % (ref 39.0–52.0)
Hemoglobin: 14.2 g/dL (ref 13.0–17.0)
Immature Granulocytes: 0 %
Lymphocytes Relative: 14 %
Lymphs Abs: 1.2 10*3/uL (ref 0.7–4.0)
MCH: 32.7 pg (ref 26.0–34.0)
MCHC: 33.8 g/dL (ref 30.0–36.0)
MCV: 96.8 fL (ref 80.0–100.0)
Monocytes Absolute: 0.8 10*3/uL (ref 0.1–1.0)
Monocytes Relative: 9 %
Neutro Abs: 6.3 10*3/uL (ref 1.7–7.7)
Neutrophils Relative %: 76 %
Platelets: 219 10*3/uL (ref 150–400)
RBC: 4.34 MIL/uL (ref 4.22–5.81)
RDW: 13 % (ref 11.5–15.5)
WBC: 8.4 10*3/uL (ref 4.0–10.5)
nRBC: 0 % (ref 0.0–0.2)

## 2020-09-24 LAB — COMPREHENSIVE METABOLIC PANEL
ALT: 16 U/L (ref 0–44)
AST: 14 U/L — ABNORMAL LOW (ref 15–41)
Albumin: 3.7 g/dL (ref 3.5–5.0)
Alkaline Phosphatase: 80 U/L (ref 38–126)
Anion gap: 5 (ref 5–15)
BUN: 20 mg/dL (ref 6–20)
CO2: 27 mmol/L (ref 22–32)
Calcium: 8.9 mg/dL (ref 8.9–10.3)
Chloride: 103 mmol/L (ref 98–111)
Creatinine, Ser: 1.28 mg/dL — ABNORMAL HIGH (ref 0.61–1.24)
GFR, Estimated: >60 mL/min (ref >60)
Glucose, Bld: 102 mg/dL — ABNORMAL HIGH (ref 70–99)
Potassium: 4.7 mmol/L (ref 3.5–5.1)
Sodium: 135 mmol/L (ref 135–145)
Total Bilirubin: 0.4 mg/dL (ref 0.3–1.2)
Total Protein: 7.1 g/dL (ref 6.5–8.1)

## 2020-09-24 LAB — CHLAMYDIA/NGC RT PCR (ARMC ONLY)
Chlamydia Tr: NOT DETECTED
N gonorrhoeae: NOT DETECTED

## 2020-09-24 MED ORDER — ORPHENADRINE CITRATE 30 MG/ML IJ SOLN
60.0000 mg | Freq: Two times a day (BID) | INTRAMUSCULAR | Status: DC
  Administered 2020-09-24: 60 mg via INTRAVENOUS
  Filled 2020-09-24: qty 2

## 2020-09-24 MED ORDER — METRONIDAZOLE 500 MG PO TABS
500.0000 mg | ORAL_TABLET | Freq: Two times a day (BID) | ORAL | 0 refills | Status: AC
Start: 2020-09-24 — End: 2020-10-08

## 2020-09-24 MED ORDER — CIPROFLOXACIN HCL 500 MG PO TABS: 500.0000 mg | ORAL_TABLET | Freq: Two times a day (BID) | ORAL | 0 refills | Status: AC

## 2020-09-24 MED ORDER — IOHEXOL 300 MG/ML  SOLN
100.0000 mL | Freq: Once | INTRAMUSCULAR | Status: AC | PRN
  Administered 2020-09-24: 100 mL via INTRAVENOUS
  Filled 2020-09-24: qty 100

## 2020-09-24 MED ORDER — CEFTRIAXONE SODIUM 1 G IJ SOLR
1.0000 g | Freq: Once | INTRAMUSCULAR | Status: AC
  Administered 2020-09-24: 1 g via INTRAMUSCULAR
  Filled 2020-09-24: qty 10

## 2020-09-24 MED ORDER — CIPROFLOXACIN IN D5W 400 MG/200ML IV SOLN
400.0000 mg | Freq: Once | INTRAVENOUS | Status: AC
  Administered 2020-09-24: 400 mg via INTRAVENOUS
  Filled 2020-09-24: qty 200

## 2020-09-24 MED ORDER — CIPROFLOXACIN 500 MG TABLET
ORAL_TABLET | 0 refills | 0 days
Start: 2020-09-24 — End: ?

## 2020-09-24 MED ORDER — METRONIDAZOLE 500 MG TABLET
ORAL_TABLET | 0 refills | 0 days
Start: 2020-09-24 — End: ?

## 2020-09-24 NOTE — ED Triage Notes (Signed)
Pt to ED stating thinks has STi, fiance currently being treated for STI, also has another partner. Pt denies urethral discharge. States pain with urination and pain R kidney. Pt NAD.

## 2020-09-24 NOTE — ED Notes (Signed)
Pt c/o "kidney pain" x several days, pt also c/o dysuria x several days as well. Pt ambulatory with slight limp from triage. Pt able to provide UA, urine noted to be cloudy with slight foul smell when collected by this RN.

## 2020-09-24 NOTE — ED Notes (Signed)
Multiple attempts over the last hour made to [place] to arrange transportation for this patient. This RN encouraged patient to contact other family members and friends to collect him from the ER. Pt states he "doesn't have anybody like that. I don't have family. My fiance's car is broken. I should have gone to [facility]". This RN explained that pt the cab company is not answering the phone and this is the only resource available for discharge if the patient cannot arrange a ride. Pt taken to lobby at this time.
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
## 2020-09-24 NOTE — ED Notes (Signed)
Patient ambulated to and from hallway restroom with a steady gait.

## 2020-09-27 LAB — URINE CULTURE: Culture: >=100000 — AB

## 2020-10-17 ENCOUNTER — Emergency Department
Admission: EM | Admit: 2020-10-17 | Discharge: 2020-10-18 | Disposition: A | Discharge status: Other Acute Inpatient Hospital | Payer: Medicaid Other | Attending: Emergency Medicine | Admitting: Emergency Medicine

## 2020-10-17 ENCOUNTER — Other Ambulatory Visit: Payer: Self-pay

## 2020-10-17 DIAGNOSIS — I2510 Atherosclerotic heart disease of native coronary artery without angina pectoris: Secondary | ICD-10-CM | POA: Insufficient documentation

## 2020-10-17 DIAGNOSIS — Y906 Blood alcohol level of 120-199 mg/100 ml: Secondary | ICD-10-CM | POA: Insufficient documentation

## 2020-10-17 DIAGNOSIS — I10 Essential (primary) hypertension: Secondary | ICD-10-CM | POA: Diagnosis not present

## 2020-10-17 DIAGNOSIS — M1610 Unilateral primary osteoarthritis, unspecified hip: Secondary | ICD-10-CM | POA: Diagnosis present

## 2020-10-17 DIAGNOSIS — F332 Major depressive disorder, recurrent severe without psychotic features: Secondary | ICD-10-CM | POA: Diagnosis not present

## 2020-10-17 DIAGNOSIS — F1420 Cocaine dependence, uncomplicated: Secondary | ICD-10-CM | POA: Diagnosis not present

## 2020-10-17 DIAGNOSIS — R45851 Suicidal ideations: Secondary | ICD-10-CM | POA: Diagnosis not present

## 2020-10-17 DIAGNOSIS — F17210 Nicotine dependence, cigarettes, uncomplicated: Secondary | ICD-10-CM | POA: Insufficient documentation

## 2020-10-17 DIAGNOSIS — J45909 Unspecified asthma, uncomplicated: Secondary | ICD-10-CM | POA: Insufficient documentation

## 2020-10-17 DIAGNOSIS — B20 Human immunodeficiency virus [HIV] disease: Secondary | ICD-10-CM | POA: Diagnosis present

## 2020-10-17 DIAGNOSIS — Z20822 Contact with and (suspected) exposure to covid-19: Secondary | ICD-10-CM | POA: Insufficient documentation

## 2020-10-17 DIAGNOSIS — F10288 Alcohol dependence with other alcohol-induced disorder: Secondary | ICD-10-CM | POA: Insufficient documentation

## 2020-10-17 DIAGNOSIS — Z046 Encounter for general psychiatric examination, requested by authority: Secondary | ICD-10-CM | POA: Diagnosis present

## 2020-10-17 DIAGNOSIS — F319 Bipolar disorder, unspecified: Secondary | ICD-10-CM | POA: Insufficient documentation

## 2020-10-17 DIAGNOSIS — F322 Major depressive disorder, single episode, severe without psychotic features: Secondary | ICD-10-CM

## 2020-10-17 LAB — CBC
HCT: 39.1 % (ref 39.0–52.0)
Hemoglobin: 13.5 g/dL (ref 13.0–17.0)
MCH: 32.5 pg (ref 26.0–34.0)
MCHC: 34.5 g/dL (ref 30.0–36.0)
MCV: 94 fL (ref 80.0–100.0)
Platelets: 228 10*3/uL (ref 150–400)
RBC: 4.16 MIL/uL — ABNORMAL LOW (ref 4.22–5.81)
RDW: 12.9 % (ref 11.5–15.5)
WBC: 4.8 10*3/uL (ref 4.0–10.5)
nRBC: 0 % (ref 0.0–0.2)

## 2020-10-17 LAB — COMPREHENSIVE METABOLIC PANEL
ALT: 23 U/L (ref 0–44)
AST: 21 U/L (ref 15–41)
Albumin: 3.6 g/dL (ref 3.5–5.0)
Alkaline Phosphatase: 81 U/L (ref 38–126)
Anion gap: 9 (ref 5–15)
BUN: 21 mg/dL — ABNORMAL HIGH (ref 6–20)
CO2: 27 mmol/L (ref 22–32)
Calcium: 8.9 mg/dL (ref 8.9–10.3)
Chloride: 100 mmol/L (ref 98–111)
Creatinine, Ser: 1.2 mg/dL (ref 0.61–1.24)
GFR, Estimated: >60 mL/min (ref >60)
Glucose, Bld: 108 mg/dL — ABNORMAL HIGH (ref 70–99)
Potassium: 3.4 mmol/L — ABNORMAL LOW (ref 3.5–5.1)
Sodium: 136 mmol/L (ref 135–145)
Total Bilirubin: 0.5 mg/dL (ref 0.3–1.2)
Total Protein: 7.8 g/dL (ref 6.5–8.1)

## 2020-10-17 LAB — URINE DRUG SCREEN, QUALITATIVE (ARMC ONLY)
Amphetamines, Ur Screen: NOT DETECTED
Barbiturates, Ur Screen: NOT DETECTED
Benzodiazepine, Ur Scrn: NOT DETECTED
Cannabinoid 50 Ng, Ur ~~LOC~~: NOT DETECTED
Cocaine Metabolite,Ur ~~LOC~~: POSITIVE — AB
MDMA (Ecstasy)Ur Screen: NOT DETECTED
Methadone Scn, Ur: NOT DETECTED
Opiate, Ur Screen: NOT DETECTED
Phencyclidine (PCP) Ur S: NOT DETECTED
Tricyclic, Ur Screen: NOT DETECTED

## 2020-10-17 LAB — RESPIRATORY PANEL BY RT PCR (FLU A&B, COVID)
Influenza A by PCR: NEGATIVE
Influenza B by PCR: NEGATIVE
SARS Coronavirus 2 by RT PCR: NEGATIVE

## 2020-10-17 LAB — ACETAMINOPHEN LEVEL: Acetaminophen (Tylenol), Serum: <10 ug/mL — ABNORMAL LOW (ref 10–30)

## 2020-10-17 LAB — ETHANOL: Alcohol, Ethyl (B): <10 mg/dL (ref <10)

## 2020-10-17 LAB — SALICYLATE LEVEL: Salicylate Lvl: <7 mg/dL — ABNORMAL LOW (ref 7.0–30.0)

## 2020-10-17 NOTE — ED Notes (Signed)
Pt belongings:  1 pair of black shoes 1 pair of black pants 1 jacket 1 black [employer] hat 1 short sleeve shirt 1 black [employer] backpack

## 2020-10-17 NOTE — ED Notes (Signed)
Pt stated he has not eaten in 3 days after being on a drug binge.
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
## 2020-10-17 NOTE — ED Provider Notes (Signed)
[facility] Emergency Department Provider Note  ____________________________________________  Time seen: Approximately 9:11 PM  I have reviewed the triage vital signs and the nursing notes.   HISTORY  Chief Complaint Suicidal    HPI Roy Gales Sr. is a 54 y.o. male with a history of hypertension bipolar disorder and HIV who was brought to the ED under involuntary commitment from [facility] due to feeling depressed, suicidal with plans to overdose or cut himself.  Psychiatric exam completed at [facility].  Patient denies any injuries today, no acute symptoms, completely being hungry.  Denies HI or hallucinations.      Past Medical History:  Diagnosis Date  . AIDS (acquired immune deficiency syndrome) (HCC)   . Arthritis   . Asthma   . Bipolar disorder (HCC)   . Bronchitis   . Complication of anesthesia   . Coronary artery disease   . Depression   . Dysrhythmia    1st degree heart block/ brady  . GERD (gastroesophageal reflux disease)   . Hepatitis C    treated  . HIV (human immunodeficiency virus infection) (HCC)   . HTN (hypertension)   . PONV (postoperative nausea and vomiting)      Patient Active Problem List   Diagnosis Date Noted  . Allergic rhinitis 06/21/2020  . GERD (gastroesophageal reflux disease) 06/21/2020  . 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  . Bipolar disorder (manic depression) (HCC) 05/18/2020  . Adjustment disorder with mixed anxiety and depressed mood 05/18/2020  . Arthritis pain, hip 05/18/2020  . 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 Angioedema 07/24/2019  . MDD (major depressive disorder), recurrent episode (HCC) 03/23/2019  . MDD (major depressive disorder), recurrent episode, moderate (HCC) 02/19/2019  . MDD (major depressive disorder), recurrent severe, without psychosis (HCC) 02/19/2019  . Chest pain 05/27/2018  . Major depressive disorder, recurrent severe without psychotic features (HCC) 05/27/2018  . ARF  (acute renal failure) (HCC) 04/08/2018  . Malingering 03/07/2017  . Cocaine dependence (HCC) 02/19/2017  . Substance induced mood disorder (HCC) 02/19/2017  . Alcohol use disorder, severe, dependence (HCC) 12/27/2016  . HIV disease (HCC) 10/26/2016  . HTN (hypertension) 10/26/2016  . Dyslipidemia 10/26/2016  . BPH (benign prostatic hyperplasia) 10/26/2016  . Constipation 10/26/2016  . Acquired hallux rigidus of right foot 07/16/2016  . Arthritis 10/25/2014  . Genital warts 08/25/2013     Past Surgical History:  Procedure Laterality Date  . APPLICATION OF WOUND VAC Right 09/01/2019   Procedure: APPLICATION OF WOUND VAC;  Surgeon: Kennedy Bucker, MD;  Location: [facility] ORS;  Service: Orthopedics;  Laterality: Right;  Serial # [id number]  . HERNIA REPAIR Left    inguinal  . TOE SURGERY    . TOE SURGERY Right   . TOTAL HIP ARTHROPLASTY Right 09/01/2019   Procedure: TOTAL HIP ARTHROPLASTY ANTERIOR APPROACH;  Surgeon: Kennedy Bucker, MD;  Location: [facility] ORS;  Service: Orthopedics;  Laterality: Right;     Prior to Admission medications   Medication Sig Start Date End Date Taking? Authorizing Provider  albuterol (VENTOLIN HFA) 108 (90 Base) MCG/ACT inhaler Inhale 1-2 puffs into the lungs every 4 (four) hours as needed for wheezing or shortness of breath. 05/23/20   Clapacs, Jackquline Denmark, MD  atorvastatin (LIPITOR) 10 MG tablet Take 1 tablet (10 mg total) by mouth daily at 6 PM. Patient not taking: Reported on 07/01/2020 05/23/20   Clapacs, Jackquline Denmark, MD  bictegravir-emtricitabine-tenofovir AF (BIKTARVY) 50-200-25 MG TABS tablet Take 1 tablet by mouth daily. Patient not  taking: Reported on 07/01/2020 05/24/20   Clapacs, Jackquline Denmark, MD  cephALEXin (KEFLEX) 500 MG capsule Take 1 capsule (500 mg total) by mouth 4 (four) times daily. 07/12/20   Lynn Ito, MD  citalopram (CELEXA) 40 MG tablet Take 1 tablet (40 mg total) by mouth daily. Patient not taking: Reported on 07/01/2020 05/24/20   Clapacs, Jackquline Denmark, MD   gabapentin (NEURONTIN) 300 MG capsule Take 1 capsule (300 mg total) by mouth 2 (two) times daily. Patient not taking: Reported on 07/01/2020 05/23/20   Clapacs, Jackquline Denmark, MD  hydrOXYzine (ATARAX/VISTARIL) 50 MG tablet Take 1 tablet (50 mg total) by mouth 3 (three) times daily as needed. Patient not taking: Reported on 07/01/2020 06/17/20   Joni Reining, PA-C  oxyCODONE (OXY IR/ROXICODONE) 5 MG immediate release tablet Take by mouth. Patient not taking: Reported on 06/21/2020 06/09/20   [provider]  traMADol (ULTRAM) 50 MG tablet Take 1 tablet (50 mg total) by mouth every 6 (six) hours as needed. Patient not taking: Reported on 07/01/2020 06/01/20   Evon Slack, PA-C  traZODone (DESYREL) 300 MG tablet Take 1 tablet (300 mg total) by mouth at bedtime as needed for sleep. Patient not taking: Reported on 07/01/2020 05/23/20   Clapacs, Jackquline Denmark, MD     Allergies Amlodipine, Lisinopril, Lactose, and Pollen extract   Family History  Problem Relation Age of Onset  . Cancer Brother   . Uterine cancer Mother   . CAD Mother   . Hypertension Mother   . Hyperlipidemia Mother     Social History Social History   Tobacco Use  . Smoking status: Current Every Day Smoker    Packs/day: 0.25    Types: Cigarettes  . Smokeless tobacco: Never Used  Vaping Use  . Vaping Use: Never used  Substance Use Topics  . Alcohol use: Yes    Alcohol/week: 84.0 standard drinks    Types: 84 Cans of beer per week    Comment: daily  . Drug use: Yes    Types: Cocaine, "Crack" cocaine    Comment: states last use 07/10/2020    Review of Systems  Constitutional:   No fever or chills.  ENT:   No sore throat. No rhinorrhea. Cardiovascular:   No chest pain or syncope. Respiratory:   No dyspnea or cough. Gastrointestinal:   Negative for abdominal pain, vomiting and diarrhea.  Musculoskeletal:   Negative for focal pain or swelling All other systems reviewed and are negative except as documented above in ROS  and HPI.  ____________________________________________   PHYSICAL EXAM:  VITAL SIGNS: ED Triage Vitals  Enc Vitals Group     BP 10/17/20 1824 119/66     Pulse Rate 10/17/20 1824 64     Resp 10/17/20 1824 16     Temp 10/17/20 1825 98.9 F (37.2 C)     Temp Source 10/17/20 1824 Oral     SpO2 10/17/20 1824 100 %     Weight 10/17/20 1827 165 lb (74.8 kg)     Height 10/17/20 1827 5\' 9"  (1.753 m)     Head Circumference --      Peak Flow --      Pain Score 10/17/20 1827 0     Pain Loc --      Pain Edu? --      Excl. in GC? --     Vital signs reviewed, nursing assessments reviewed.   Constitutional:   Alert and oriented. Non-toxic appearance. Eyes:   Conjunctivae are  normal. EOMI. PERRL. ENT      Head:   Normocephalic and atraumatic.      Nose:   Wearing a mask.      Mouth/Throat:   Wearing a mask.      Neck:   No meningismus. Full ROM. Hematological/Lymphatic/Immunilogical:   No cervical lymphadenopathy. Cardiovascular:   RRR. Symmetric bilateral radial and DP pulses.  No murmurs. Cap refill less than 2 seconds. Respiratory:   Normal respiratory effort without tachypnea/retractions. Breath sounds are clear and equal bilaterally. No wheezes/rales/rhonchi. Gastrointestinal:   Soft and nontender. Non distended. There is no CVA tenderness.  No rebound, rigidity, or guarding.  Musculoskeletal:   Normal range of motion in all extremities. No joint effusions.  No lower extremity tenderness.  No edema.  No wounds Neurologic:   Normal speech and language.  Motor grossly intact. No acute focal neurologic deficits are appreciated.  Skin:    Skin is warm, dry and intact. No rash noted.  No petechiae, purpura, or bullae.  ____________________________________________    LABS (pertinent positives/negatives) (all labs ordered are listed, but only abnormal results are displayed) Labs Reviewed  COMPREHENSIVE METABOLIC PANEL - Abnormal; Notable for the following components:      Result  Value   Potassium 3.4 (*)    Glucose, Bld 108 (*)    BUN 21 (*)    All other components within normal limits  CBC - Abnormal; Notable for the following components:   RBC 4.16 (*)    All other components within normal limits  ACETAMINOPHEN LEVEL - Abnormal; Notable for the following components:   Acetaminophen (Tylenol), Serum <10 (*)    All other components within normal limits  SALICYLATE LEVEL - Abnormal; Notable for the following components:   Salicylate Lvl <7.0 (*)    All other components within normal limits  RESPIRATORY PANEL BY RT PCR (FLU A&B, COVID)  ETHANOL  URINE DRUG SCREEN, QUALITATIVE ([facility] ONLY)   ____________________________________________   EKG    ____________________________________________    RADIOLOGY  No results found.  ____________________________________________   PROCEDURES Procedures  ____________________________________________    CLINICAL IMPRESSION / ASSESSMENT AND PLAN / ED COURSE  Medications ordered in the ED: Medications - No data to display  Pertinent labs & imaging results that were available during my care of the patient were reviewed by me and considered in my medical decision making (see chart for details).  Roy Fewkes Fakhouri Sr. was evaluated in Emergency Department on 10/17/2020 for the symptoms described in the history of present illness. He was evaluated in the context of the global COVID-19 pandemic, which necessitated consideration that the patient might be at risk for infection with the SARS-CoV-2 virus that causes COVID-19. Institutional protocols and algorithms that pertain to the evaluation of patients at risk for COVID-19 are in a state of rapid change based on information released by regulatory bodies including the CDC and federal and state organizations. These policies and algorithms were followed during the patient's care in the ED.   Patient sent to the ED under involuntary commitment after psych exam at [facility].   Has history of drug abuse.  Currently medically stable with normal vitals.  Will contact psychiatry for bed placement and continued reevaluation in the ED while waiting.  The patient has been placed in psychiatric observation due to the need to provide a safe environment for the patient while obtaining psychiatric consultation and evaluation, as well as ongoing medical and medication management to treat the patient's condition.  The  patient has been placed under full IVC at this time. .      ____________________________________________   FINAL CLINICAL IMPRESSION(S) / ED DIAGNOSES    Final diagnoses:  None     ED Discharge Orders    None      Portions of this note were generated with dragon dictation software. Dictation errors may occur despite best attempts at proofreading.   Sharman Cheek, MD 10/17/20 2117

## 2020-10-17 NOTE — ED Notes (Signed)
Pt given ice water and meal tray  

## 2020-10-17 NOTE — ED Notes (Addendum)
Pt given dinner tray, ginger ale, a blanket and a pillow.

## 2020-10-17 NOTE — ED Triage Notes (Signed)
Pt here with suicidal thoughts with BPD under IVC. Pt states that he wants to slice his wrist or "take a bunch of medicine". Pt calm and cooperative in triage.

## 2020-10-18 ENCOUNTER — Encounter: Payer: Self-pay | Admitting: Behavioral Health

## 2020-10-18 ENCOUNTER — Inpatient Hospital Stay
Admission: AD | Admit: 2020-10-18 | Discharge: 2020-10-28 | DRG: 885 | Disposition: A | Discharge status: Home / Self Care | Payer: Medicaid Other | Source: Intra-hospital | Attending: Behavioral Health | Admitting: Behavioral Health

## 2020-10-18 ENCOUNTER — Other Ambulatory Visit: Payer: Self-pay

## 2020-10-18 DIAGNOSIS — Z91011 Allergy to milk products: Secondary | ICD-10-CM

## 2020-10-18 DIAGNOSIS — Z79899 Other long term (current) drug therapy: Secondary | ICD-10-CM | POA: Diagnosis not present

## 2020-10-18 DIAGNOSIS — Z8249 Family history of ischemic heart disease and other diseases of the circulatory system: Secondary | ICD-10-CM

## 2020-10-18 DIAGNOSIS — I2510 Atherosclerotic heart disease of native coronary artery without angina pectoris: Secondary | ICD-10-CM | POA: Diagnosis present

## 2020-10-18 DIAGNOSIS — F17210 Nicotine dependence, cigarettes, uncomplicated: Secondary | ICD-10-CM | POA: Diagnosis present

## 2020-10-18 DIAGNOSIS — J45909 Unspecified asthma, uncomplicated: Secondary | ICD-10-CM | POA: Diagnosis present

## 2020-10-18 DIAGNOSIS — K219 Gastro-esophageal reflux disease without esophagitis: Secondary | ICD-10-CM | POA: Diagnosis present

## 2020-10-18 DIAGNOSIS — R519 Headache, unspecified: Secondary | ICD-10-CM | POA: Diagnosis present

## 2020-10-18 DIAGNOSIS — Z83438 Family history of other disorder of lipoprotein metabolism and other lipidemia: Secondary | ICD-10-CM

## 2020-10-18 DIAGNOSIS — I440 Atrioventricular block, first degree: Secondary | ICD-10-CM | POA: Diagnosis present

## 2020-10-18 DIAGNOSIS — F1420 Cocaine dependence, uncomplicated: Secondary | ICD-10-CM | POA: Diagnosis present

## 2020-10-18 DIAGNOSIS — Z8049 Family history of malignant neoplasm of other genital organs: Secondary | ICD-10-CM

## 2020-10-18 DIAGNOSIS — I10 Essential (primary) hypertension: Secondary | ICD-10-CM | POA: Diagnosis present

## 2020-10-18 DIAGNOSIS — G4700 Insomnia, unspecified: Secondary | ICD-10-CM | POA: Diagnosis present

## 2020-10-18 DIAGNOSIS — Z8619 Personal history of other infectious and parasitic diseases: Secondary | ICD-10-CM

## 2020-10-18 DIAGNOSIS — M1990 Unspecified osteoarthritis, unspecified site: Secondary | ICD-10-CM | POA: Diagnosis present

## 2020-10-18 DIAGNOSIS — Z046 Encounter for general psychiatric examination, requested by authority: Secondary | ICD-10-CM | POA: Diagnosis not present

## 2020-10-18 DIAGNOSIS — Z888 Allergy status to other drugs, medicaments and biological substances status: Secondary | ICD-10-CM | POA: Diagnosis not present

## 2020-10-18 DIAGNOSIS — B20 Human immunodeficiency virus [HIV] disease: Secondary | ICD-10-CM | POA: Diagnosis present

## 2020-10-18 DIAGNOSIS — F332 Major depressive disorder, recurrent severe without psychotic features: Secondary | ICD-10-CM | POA: Diagnosis present

## 2020-10-18 DIAGNOSIS — N390 Urinary tract infection, site not specified: Secondary | ICD-10-CM | POA: Diagnosis present

## 2020-10-18 DIAGNOSIS — Z96641 Presence of right artificial hip joint: Secondary | ICD-10-CM | POA: Diagnosis present

## 2020-10-18 DIAGNOSIS — R45851 Suicidal ideations: Secondary | ICD-10-CM | POA: Diagnosis present

## 2020-10-18 LAB — URINALYSIS, ROUTINE W REFLEX MICROSCOPIC
Bilirubin Urine: NEGATIVE
Glucose, UA: NEGATIVE mg/dL
Ketones, ur: NEGATIVE mg/dL
Nitrite: POSITIVE — AB
Protein, ur: 30 mg/dL — AB
RBC / HPF: >50 RBC/hpf — ABNORMAL HIGH (ref 0–5)
Specific Gravity, Urine: 1.013 (ref 1.005–1.030)
WBC, UA: >50 WBC/hpf — ABNORMAL HIGH (ref 0–5)
pH: 6 (ref 5.0–8.0)

## 2020-10-18 MED ORDER — TRAZODONE HCL 100 MG PO TABS
300.0000 mg | ORAL_TABLET | Freq: Every evening | ORAL | Status: DC | PRN
  Administered 2020-10-19: 300 mg via ORAL
  Filled 2020-10-18: qty 3

## 2020-10-18 MED ORDER — BICTEGRAVIR-EMTRICITAB-TENOFOV 50-200-25 MG PO TABS: 1.0000 | ORAL_TABLET | Freq: Every day | ORAL | Status: DC

## 2020-10-18 MED ORDER — GABAPENTIN 300 MG PO CAPS
300.0000 mg | ORAL_CAPSULE | Freq: Two times a day (BID) | ORAL | Status: DC
  Filled 2020-10-18: qty 1

## 2020-10-18 MED ORDER — CITALOPRAM HYDROBROMIDE 20 MG PO TABS
40.0000 mg | ORAL_TABLET | Freq: Every day | ORAL | Status: DC
  Administered 2020-10-19 – 2020-10-28 (×10): 40 mg via ORAL
  Filled 2020-10-18 (×10): qty 2

## 2020-10-18 MED ORDER — MAGNESIUM HYDROXIDE 400 MG/5ML PO SUSP: 30.0000 mL | Freq: Every day | ORAL | Status: DC | PRN

## 2020-10-18 MED ORDER — BICTEGRAVIR-EMTRICITAB-TENOFOV 50-200-25 MG PO TABS
1.0000 | ORAL_TABLET | Freq: Every day | ORAL | Status: DC
  Administered 2020-10-19 – 2020-10-28 (×10): 1 via ORAL
  Filled 2020-10-18 (×10): qty 1

## 2020-10-18 MED ORDER — ONDANSETRON HCL 4 MG PO TABS
8.0000 mg | ORAL_TABLET | Freq: Three times a day (TID) | ORAL | Status: DC | PRN
  Administered 2020-10-18: 8 mg via ORAL
  Filled 2020-10-18: qty 2

## 2020-10-18 MED ORDER — TRAZODONE HCL 100 MG PO TABS: 300.0000 mg | ORAL_TABLET | Freq: Every evening | ORAL | Status: DC | PRN

## 2020-10-18 MED ORDER — GABAPENTIN 300 MG PO CAPS
300.0000 mg | ORAL_CAPSULE | Freq: Two times a day (BID) | ORAL | Status: DC
  Administered 2020-10-20 (×2): 300 mg via ORAL
  Filled 2020-10-18: qty 3
  Filled 2020-10-18 (×2): qty 1

## 2020-10-18 MED ORDER — ALBUTEROL SULFATE HFA 108 (90 BASE) MCG/ACT IN AERS: 2.0000 | INHALATION_SPRAY | Freq: Four times a day (QID) | RESPIRATORY_TRACT | Status: DC | PRN

## 2020-10-18 MED ORDER — ATORVASTATIN CALCIUM 20 MG PO TABS: 10.0000 mg | ORAL_TABLET | Freq: Every day | ORAL | Status: DC

## 2020-10-18 MED ORDER — HYDROXYZINE HCL 25 MG PO TABS
50.0000 mg | ORAL_TABLET | Freq: Once | ORAL | Status: AC
  Administered 2020-10-18: 50 mg via ORAL
  Filled 2020-10-18: qty 2

## 2020-10-18 MED ORDER — TRAMADOL HCL 50 MG PO TABS
50.0000 mg | ORAL_TABLET | Freq: Four times a day (QID) | ORAL | Status: DC | PRN
  Administered 2020-10-18 – 2020-10-25 (×4): 50 mg via ORAL
  Filled 2020-10-18 (×4): qty 1

## 2020-10-18 MED ORDER — BICTEGRAVIR-EMTRICITAB-TENOFOV 50-200-25 MG PO TABS
1.0000 | ORAL_TABLET | Freq: Every day | ORAL | Status: DC
  Administered 2020-10-18: 1 via ORAL
  Filled 2020-10-18: qty 1

## 2020-10-18 MED ORDER — HYDROXYZINE HCL 25 MG PO TABS: 50.0000 mg | ORAL_TABLET | Freq: Three times a day (TID) | ORAL | Status: DC | PRN

## 2020-10-18 MED ORDER — ENSURE ENLIVE PO LIQD
237.0000 mL | Freq: Two times a day (BID) | ORAL | Status: DC
  Administered 2020-10-19 (×2): 237 mL via ORAL

## 2020-10-18 MED ORDER — ALUM & MAG HYDROXIDE-SIMETH 200-200-20 MG/5ML PO SUSP
30.0000 mL | ORAL | Status: DC | PRN
  Administered 2020-10-27: 30 mL via ORAL
  Filled 2020-10-18: qty 30

## 2020-10-18 MED ORDER — ATORVASTATIN CALCIUM 20 MG PO TABS
10.0000 mg | ORAL_TABLET | Freq: Every day | ORAL | Status: DC
  Administered 2020-10-18: 10 mg via ORAL
  Filled 2020-10-18: qty 1

## 2020-10-18 MED ORDER — ALBUTEROL SULFATE HFA 108 (90 BASE) MCG/ACT IN AERS
1.0000 | INHALATION_SPRAY | RESPIRATORY_TRACT | Status: DC | PRN
  Administered 2020-10-24 – 2020-10-25 (×2): 2 via RESPIRATORY_TRACT
  Filled 2020-10-18: qty 6.7

## 2020-10-18 MED ORDER — HYDROXYZINE HCL 50 MG PO TABS
50.0000 mg | ORAL_TABLET | Freq: Three times a day (TID) | ORAL | Status: DC | PRN
  Administered 2020-10-20 – 2020-10-26 (×5): 50 mg via ORAL
  Filled 2020-10-18 (×6): qty 1

## 2020-10-18 MED ORDER — TRAMADOL HCL 50 MG PO TABS: 50.0000 mg | ORAL_TABLET | Freq: Four times a day (QID) | ORAL | Status: DC | PRN

## 2020-10-18 MED ORDER — ATORVASTATIN CALCIUM 20 MG PO TABS
10.0000 mg | ORAL_TABLET | Freq: Every day | ORAL | Status: DC
  Administered 2020-10-19 – 2020-10-28 (×10): 10 mg via ORAL
  Filled 2020-10-18 (×10): qty 1

## 2020-10-18 MED ORDER — ACETAMINOPHEN 325 MG PO TABS
650.0000 mg | ORAL_TABLET | Freq: Four times a day (QID) | ORAL | Status: DC | PRN
  Administered 2020-10-24 – 2020-10-25 (×2): 650 mg via ORAL
  Filled 2020-10-18 (×2): qty 2

## 2020-10-18 MED ORDER — CITALOPRAM HYDROBROMIDE 20 MG PO TABS
40.0000 mg | ORAL_TABLET | Freq: Every day | ORAL | Status: DC
  Administered 2020-10-18: 40 mg via ORAL
  Filled 2020-10-18: qty 2

## 2020-10-18 MED ORDER — ALBUTEROL SULFATE HFA 108 (90 BASE) MCG/ACT IN AERS
2.0000 | INHALATION_SPRAY | Freq: Four times a day (QID) | RESPIRATORY_TRACT | Status: DC | PRN
  Filled 2020-10-18: qty 6.7

## 2020-10-18 NOTE — ED Provider Notes (Signed)
Emergency Medicine Observation Re-evaluation Note  Roy Mixson Sr. is a 54 y.o. male, seen on rounds today.  Pt initially presented to the ED for complaints of Suicidal Currently, the patient is resting, voices no medical complaints.  Physical Exam  BP 119/66 (BP Location: Left Arm)   Pulse 64   Temp 98.9 F (37.2 C) (Oral)   Resp 16   Ht 5\' 9"  (1.753 m)   Wt 74.8 kg   SpO2 100%   BMI 24.37 kg/m  Physical Exam General: Resting in no acute distress Cardiac: No cyanosis Lungs: Equal rise and fall Psych: Not agitated  ED Course / MDM  EKG:    I have reviewed the labs performed to date as well as medications administered while in observation.  Recent changes in the last 24 hours include no events overnight.  Plan  Current plan is for psychiatric disposition. Patient is under full IVC at this time.   , MD 10/18/20 [id number]

## 2020-10-18 NOTE — ED Notes (Signed)
Lunch tray given. No other needs found at this moment.  

## 2020-10-18 NOTE — ED Notes (Signed)
Pt provided with breakfast tray.

## 2020-10-18 NOTE — Plan of Care (Signed)
New admission  Problem: Education: Goal: Knowledge of [facility] Education information/materials will improve Outcome: Not Progressing Goal: Emotional status will improve Outcome: Not Progressing Goal: Mental status will improve Outcome: Not Progressing Goal: Verbalization of understanding the information provided will improve Outcome: Not Progressing   Problem: Activity: Goal: Interest or engagement in activities will improve Outcome: Not Progressing Goal: Sleeping patterns will improve Outcome: Not Progressing   Problem: Coping: Goal: Ability to verbalize frustrations and anger appropriately will improve Outcome: Not Progressing Goal: Ability to demonstrate self-control will improve Outcome: Not Progressing   Problem: Health Behavior/Discharge Planning: Goal: Identification of resources available to assist in meeting health care needs will improve Outcome: Not Progressing Goal: Compliance with treatment plan for underlying cause of condition will improve Outcome: Not Progressing   Problem: Physical Regulation: Goal: Ability to maintain clinical measurements within normal limits will improve Outcome: Not Progressing   Problem: Safety: Goal: Periods of time without injury will increase Outcome: Not Progressing   Problem: Education: Goal: Knowledge of disease or condition will improve Outcome: Not Progressing Goal: Understanding of discharge needs will improve Outcome: Not Progressing   Problem: Health Behavior/Discharge Planning: Goal: Ability to identify changes in lifestyle to reduce recurrence of condition will improve Outcome: Not Progressing Goal: Identification of resources available to assist in meeting health care needs will improve Outcome: Not Progressing   Problem: Physical Regulation: Goal: Complications related to the disease process, condition or treatment will be avoided or minimized Outcome: Not Progressing   Problem: Safety: Goal:  Ability to remain free from injury will improve Outcome: Not Progressing   Problem: Education: Goal: Ability to state activities that reduce stress will improve Outcome: Not Progressing   Problem: Coping: Goal: Ability to identify and develop effective coping behavior will improve Outcome: Not Progressing   Problem: Self-Concept: Goal: Ability to identify factors that promote anxiety will improve Outcome: Not Progressing Goal: Level of anxiety will decrease Outcome: Not Progressing Goal: Ability to modify response to factors that promote anxiety will improve Outcome: Not Progressing   Problem: Education: Goal: Utilization of techniques to improve thought processes will improve Outcome: Not Progressing Goal: Knowledge of the prescribed therapeutic regimen will improve Outcome: Not Progressing   Problem: Activity: Goal: Interest or engagement in leisure activities will improve Outcome: Not Progressing Goal: Imbalance in normal sleep/wake cycle will improve Outcome: Not Progressing   Problem: Coping: Goal: Coping ability will improve Outcome: Not Progressing Goal: Will verbalize feelings Outcome: Not Progressing   Problem: Health Behavior/Discharge Planning: Goal: Ability to make decisions will improve Outcome: Not Progressing Goal: Compliance with therapeutic regimen will improve Outcome: Not Progressing   Problem: Role Relationship: Goal: Will demonstrate positive changes in social behaviors and relationships Outcome: Not Progressing   Problem: Safety: Goal: Ability to disclose and discuss suicidal ideas will improve Outcome: Not Progressing Goal: Ability to identify and utilize support systems that promote safety will improve Outcome: Not Progressing   Problem: Self-Concept: Goal: Will verbalize positive feelings about self Outcome: Not Progressing Goal: Level of anxiety will decrease Outcome: Not Progressing

## 2020-10-18 NOTE — Consult Note (Signed)
[facility] Face-to-Face Psychiatry Consult   Reason for Consult: Consult for 54 year old man with history of recurrent severe depression and cocaine abuse who comes in reporting suicidal thoughts Referring Physician: Roxan Hockey Patient Identification: Roy Liter Dungee Sr. MRN:  [id number] Principal Diagnosis: Major depressive disorder, recurrent severe without psychotic features (HCC) Diagnosis:  Principal Problem:   Major depressive disorder, recurrent severe without psychotic features (HCC) Active Problems:   HIV disease (HCC)   HTN (hypertension)   Cocaine dependence (HCC)   Arthritis pain, hip   Total Time spent with patient: 1 hour  Subjective:   Roy Register Sr. is a 54 y.o. male patient admitted with "it just seems like it is always the same thing".  HPI: Patient seen chart reviewed.  Patient known from previous encounters.  54 year old man with a history of recurrent severe depression comes to the emergency room reporting that he was having active suicidal thoughts with thoughts of throwing himself in front of a vehicle today.  He has been feeling more depressed for several months.  He feels like life is against him and nothing ever goes his way.  He had been living in a boardinghouse but even that he says seemed like he was being cheated and things were never looking out for him.  He has been using cocaine again and spending all his money.  He has not been going to [facility] or taking psychiatric medicine although he says he does stay compliant with his HIV medicine.  Chronic pain in his hip which is not getting better.  No hallucinations or psychotic symptoms.  Past Psychiatric History: Patient has had several prior hospitalizations similar circumstances.  Recurrent problems with drug abuse complicating his mood and making it difficult for him to maintain any stability.  Risk to Self: Suicidal Ideation: Yes-Currently Present Suicidal Intent: Yes-Currently Present Is patient at risk for  suicide?: Yes Suicidal Plan?: Yes-Currently Present Specify Current Suicidal Plan: Pt reports a plan to overdose on medications Access to Means: Yes Specify Access to Suicidal Means: Pt has access to medications What has been your use of drugs/alcohol within the last 12 months?: Cocaine How many times?: 1 Other Self Harm Risks: pt has a bipolar dx and HIV dx Triggers for Past Attempts: Unpredictable Intentional Self Injurious Behavior: None Risk to Others: Homicidal Ideation: No Thoughts of Harm to Others: No Current Homicidal Intent: No Current Homicidal Plan: No Access to Homicidal Means: No Identified Victim: n/a History of harm to others?: No Assessment of Violence: None Noted Violent Behavior Description: n/a Does patient have access to weapons?: No Criminal Charges Pending?: Yes Describe Pending Criminal Charges: Pt has shop lifting charges Does patient have a court date: Yes Court Date: 11/14/20 Prior Inpatient Therapy: Prior Inpatient Therapy: Yes Prior Therapy Dates: 2017, 2018, 2019, 2020, 2021 Prior Therapy Facilty/Provider(s): [facility], [facility], [facility] Reason for Treatment: Bipolar Depression; SI Prior Outpatient Therapy: Prior Outpatient Therapy: Yes Prior Therapy Dates:  (Pt unable to recall) Prior Therapy Facilty/Provider(s): [facility] Reason for Treatment: Cocaine abuse d/o; bipolar depression Does patient have an ACCT team?: No Does patient have Intensive In-House Services?  : No Does patient have [facility] services? : No Does patient have P4CC services?: No  Past Medical History:  Past Medical History:  Diagnosis Date  . AIDS (acquired immune deficiency syndrome) (HCC)   . Arthritis   . Asthma   . Bipolar disorder (HCC)   . Bronchitis   . Complication of anesthesia   . Coronary artery disease   .  Depression   . Dysrhythmia    1st degree heart block/ brady  . GERD (gastroesophageal reflux disease)   . Hepatitis C    treated  . HIV (human immunodeficiency  virus infection) (HCC)   . HTN (hypertension)   . PONV (postoperative nausea and vomiting)     Past Surgical History:  Procedure Laterality Date  . APPLICATION OF WOUND VAC Right 09/01/2019   Procedure: APPLICATION OF WOUND VAC;  Surgeon: Kennedy Bucker, MD;  Location: [facility];  Service: Orthopedics;  Laterality: Right;  Serial # [id number]  . HERNIA REPAIR Left    inguinal  . TOE SURGERY    . TOE SURGERY Right   . TOTAL HIP ARTHROPLASTY Right 09/01/2019   Procedure: TOTAL HIP ARTHROPLASTY ANTERIOR APPROACH;  Surgeon: Kennedy Bucker, MD;  Location: [facility] ORS;  Service: Orthopedics;  Laterality: Right;   Family History:  Family History  Problem Relation Age of Onset  . Cancer Brother   . Uterine cancer Mother   . CAD Mother   . Hypertension Mother   . Hyperlipidemia Mother    Family Psychiatric  History: See previous Social History:  Social History   Substance and Sexual Activity  Alcohol Use Yes  . Alcohol/week: 84.0 standard drinks  . Types: 84 Cans of beer per week   Comment: daily     Social History   Substance and Sexual Activity  Drug Use Yes  . Types: Cocaine, "Crack" cocaine   Comment: states last use 07/10/2020    Social History   Socioeconomic History  . Marital status: Divorced    Spouse name: Not on file  . Number of children: Not on file  . Years of education: Not on file  . Highest education level: Not on file  Occupational History  . Not on file  Tobacco Use  . Smoking status: Current Every Day Smoker    Packs/day: 0.25    Types: Cigarettes  . Smokeless tobacco: Never Used  Vaping Use  . Vaping Use: Never used  Substance and Sexual Activity  . Alcohol use: Yes    Alcohol/week: 84.0 standard drinks    Types: 84 Cans of beer per week    Comment: daily  . Drug use: Yes    Types: Cocaine, "Crack" cocaine    Comment: states last use 07/10/2020  . Sexual activity: Yes  Other Topics Concern  . Not on file  Social History Narrative  . Not on file    Social Determinants of Health   Financial Resource Strain:   . Difficulty of Paying Living Expenses: Not on file  Food Insecurity:   . Worried About Programme researcher, broadcasting/film/video in the Last Year: Not on file  . Ran Out of Food in the Last Year: Not on file  Transportation Needs:   . Lack of Transportation (Medical): Not on file  . Lack of Transportation (Non-Medical): Not on file  Physical Activity:   . Days of Exercise per Week: Not on file  . Minutes of Exercise per Session: Not on file  Stress:   . Feeling of Stress : Not on file  Social Connections:   . Frequency of Communication with Friends and Family: Not on file  . Frequency of Social Gatherings with Friends and Family: Not on file  . Attends Religious Services: Not on file  . Active Member of Clubs or Organizations: Not on file  . Attends Banker Meetings: Not on file  . Marital Status: Not on file  Additional Social History:    Allergies:   Allergies  Allergen Reactions  . Amlodipine Swelling    Of the tongue  . Lisinopril Swelling  . Lactose Other (See Comments)    GI distress  . Pollen Extract Other (See Comments)    Itchy eyes and runny nose    Labs:  Results for orders placed or performed during the hospital encounter of 10/17/20 (from the past 48 hour(s))  Comprehensive metabolic panel     Status: Abnormal   Collection Time: 10/17/20  6:35 PM  Result Value Ref Range   Sodium 136 135 - 145 mmol/L   Potassium 3.4 (L) 3.5 - 5.1 mmol/L   Chloride 100 98 - 111 mmol/L   CO2 27 22 - 32 mmol/L   Glucose, Bld 108 (H) 70 - 99 mg/dL    Comment: Glucose reference range applies only to samples taken after fasting for at least 8 hours.   BUN 21 (H) 6 - 20 mg/dL   Creatinine, Ser 7.82 0.61 - 1.24 mg/dL   Calcium 8.9 8.9 - 95.6 mg/dL   Total Protein 7.8 6.5 - 8.1 g/dL   Albumin 3.6 3.5 - 5.0 g/dL   AST 21 15 - 41 U/L   ALT 23 0 - 44 U/L   Alkaline Phosphatase 81 38 - 126 U/L   Total Bilirubin 0.5 0.3 -  1.2 mg/dL   GFR, Estimated >21 >30 mL/min    Comment: (NOTE) Calculated using the CKD-EPI Creatinine Equation (2021)    Anion gap 9 5 - 15    Comment: Performed at [facility], [address] [place], [place] [address]  cbc     Status: Abnormal   Collection Time: 10/17/20  6:35 PM  Result Value Ref Range   WBC 4.8 4.0 - 10.5 K/uL   RBC 4.16 (L) 4.22 - 5.81 MIL/uL   Hemoglobin 13.5 13.0 - 17.0 g/dL   HCT 46.9 39 - 52 %   MCV 94.0 80.0 - 100.0 fL   MCH 32.5 26.0 - 34.0 pg   MCHC 34.5 30.0 - 36.0 g/dL   RDW 62.9 52.8 - 41.3 %   Platelets 228 150 - 400 K/uL   nRBC 0.0 0.0 - 0.2 %    Comment: Performed at [facility], [address] [place], [place] [address]  Urine Drug Screen, Qualitative     Status: Abnormal   Collection Time: 10/17/20  6:37 PM  Result Value Ref Range   Tricyclic, Ur Screen NONE DETECTED NONE DETECTED   Amphetamines, Ur Screen NONE DETECTED NONE DETECTED   MDMA (Ecstasy)Ur Screen NONE DETECTED NONE DETECTED   Cocaine Metabolite,Ur [place] POSITIVE (A) NONE DETECTED   Opiate, Ur Screen NONE DETECTED NONE DETECTED   Phencyclidine (PCP) Ur S NONE DETECTED NONE DETECTED   Cannabinoid 50 Ng, Ur [place] NONE DETECTED NONE DETECTED   Barbiturates, Ur Screen NONE DETECTED NONE DETECTED   Benzodiazepine, Ur Scrn NONE DETECTED NONE DETECTED   Methadone Scn, Ur NONE DETECTED NONE DETECTED    Comment: (NOTE) Tricyclics + metabolites, urine    Cutoff 1000 ng/mL Amphetamines + metabolites, urine  Cutoff 1000 ng/mL MDMA (Ecstasy), urine              Cutoff 500 ng/mL Cocaine Metabolite, urine          Cutoff 300 ng/mL Opiate + metabolites, urine        Cutoff 300 ng/mL Phencyclidine (PCP), urine         Cutoff  25 ng/mL Cannabinoid, urine                 Cutoff 50 ng/mL Barbiturates + metabolites, urine  Cutoff 200 ng/mL Benzodiazepine, urine              Cutoff 200 ng/mL Methadone, urine                   Cutoff 300 ng/mL  The urine drug screen provides only  a preliminary, unconfirmed analytical test result and should not be used for non-medical purposes. Clinical consideration and professional judgment should be applied to any positive drug screen result due to possible interfering substances. A more specific alternate chemical method must be used in order to obtain a confirmed analytical result. Gas chromatography / mass spectrometry (GC/MS) is the preferred confirm atory method. Performed at [facility], [address] [place], [place] [address]   Acetaminophen level     Status: Abnormal   Collection Time: 10/17/20  8:14 PM  Result Value Ref Range   Acetaminophen (Tylenol), Serum <10 (L) 10 - 30 ug/mL    Comment: (NOTE) Therapeutic concentrations vary significantly. A range of 10-30 ug/mL  may be an effective concentration for many patients. However, some  are best treated at concentrations outside of this range. Acetaminophen concentrations >150 ug/mL at 4 hours after ingestion  and >50 ug/mL at 12 hours after ingestion are often associated with  toxic reactions.  Performed at [facility], [address] [place], [place] [address]   Ethanol     Status: None   Collection Time: 10/17/20  8:14 PM  Result Value Ref Range   Alcohol, Ethyl (B) <10 <10 mg/dL    Comment: (NOTE) Lowest detectable limit for serum alcohol is 10 mg/dL.  For medical purposes only. Performed at [facility], [address] [place], [place] [address]   Salicylate level     Status: Abnormal   Collection Time: 10/17/20  8:14 PM  Result Value Ref Range   Salicylate Lvl <7.0 (L) 7.0 - 30.0 mg/dL    Comment: Performed at [facility], [address] [place], [place] [address]  Respiratory Panel by RT PCR (Flu A&B, Covid) - Nasopharyngeal Swab     Status: None   Collection Time: 10/17/20  9:39 PM   Specimen: Nasopharyngeal Swab  Result Value Ref Range   SARS Coronavirus 2 by RT PCR NEGATIVE NEGATIVE    Comment:  (NOTE) SARS-CoV-2 target nucleic acids are NOT DETECTED.  The SARS-CoV-2 RNA is generally detectable in upper respiratoy specimens during the acute phase of infection. The lowest concentration of SARS-CoV-2 viral copies this assay can detect is 131 copies/mL. A negative result does not preclude SARS-Cov-2 infection and should not be used as the sole basis for treatment or other patient management decisions. A negative result may occur with  improper specimen collection/handling, submission of specimen other than nasopharyngeal swab, presence of viral mutation(s) within the areas targeted by this assay, and inadequate number of viral copies (<131 copies/mL). A negative result must be combined with clinical observations, patient history, and epidemiological information. The expected result is Negative.  Fact Sheet for Patients:  https://www.moore.com/  Fact Sheet for Healthcare Providers:  https://www.young.biz/  This test is no t yet approved or cleared by the [place] FDA and  has been authorized for detection and/or diagnosis of SARS-CoV-2 by FDA under an Emergency Use Authorization (EUA). This EUA will remain  in effect (meaning this test can  be used) for the duration of the COVID-19 declaration under Section 564(b)(1) of the Act, 21 U.S.C. section 360bbb-3(b)(1), unless the authorization is terminated or revoked sooner.     Influenza A by PCR NEGATIVE NEGATIVE   Influenza B by PCR NEGATIVE NEGATIVE    Comment: (NOTE) The Xpert Xpress SARS-CoV-2/FLU/RSV assay is intended as an aid in  the diagnosis of influenza from Nasopharyngeal swab specimens and  should not be used as a sole basis for treatment. Nasal washings and  aspirates are unacceptable for Xpert Xpress SARS-CoV-2/FLU/RSV  testing.  Fact Sheet for Patients: https://www.moore.com/  Fact Sheet for Healthcare  Providers: https://www.young.biz/  This test is not yet approved or cleared by the [place] FDA and  has been authorized for detection and/or diagnosis of SARS-CoV-2 by  FDA under an Emergency Use Authorization (EUA). This EUA will remain  in effect (meaning this test can be used) for the duration of the  Covid-19 declaration under Section 564(b)(1) of the Act, 21  U.S.C. section 360bbb-3(b)(1), unless the authorization is  terminated or revoked. Performed at [facility], [address] [place], [place] [address]     Current Facility-Administered Medications  Medication Dose Route Frequency Provider Last Rate Last Admin  . albuterol (VENTOLIN HFA) 108 (90 Base) MCG/ACT inhaler 2 puff  2 puff Inhalation Q6H PRN Hadyn Azer T, MD      . atorvastatin (LIPITOR) tablet 10 mg  10 mg Oral Daily Valree Feild T, MD      . bictegravir-emtricitabine-tenofovir AF (BIKTARVY) 50-200-25 MG per tablet 1 tablet  1 tablet Oral Daily Bostyn Bogie T, MD      . citalopram (CELEXA) tablet 40 mg  40 mg Oral Daily Angelissa Supan T, MD      . gabapentin (NEURONTIN) capsule 300 mg  300 mg Oral BID Akshay Spang T, MD      . hydrOXYzine (ATARAX/VISTARIL) tablet 50 mg  50 mg Oral TID PRN Bradford Cazier T, MD      . traMADol (ULTRAM) tablet 50 mg  50 mg Oral Q6H PRN Sadarius Norman T, MD      . traZODone (DESYREL) tablet 300 mg  300 mg Oral QHS PRN Ky Rumple, Jackquline Denmark, MD       Current Outpatient Medications  Medication Sig Dispense Refill  . albuterol (VENTOLIN HFA) 108 (90 Base) MCG/ACT inhaler Inhale 1-2 puffs into the lungs every 4 (four) hours as needed for wheezing or shortness of breath. 18 g 1  . atorvastatin (LIPITOR) 10 MG tablet Take 1 tablet (10 mg total) by mouth daily at 6 PM. (Patient not taking: Reported on 07/01/2020) 30 tablet 1  . bictegravir-emtricitabine-tenofovir AF (BIKTARVY) 50-200-25 MG TABS tablet Take 1 tablet by mouth daily. (Patient not taking: Reported on  07/01/2020) 30 tablet 1  . citalopram (CELEXA) 40 MG tablet Take 1 tablet (40 mg total) by mouth daily. (Patient not taking: Reported on 07/01/2020) 30 tablet 1  . gabapentin (NEURONTIN) 300 MG capsule Take 1 capsule (300 mg total) by mouth 2 (two) times daily. (Patient not taking: Reported on 07/01/2020) 60 capsule 1  . hydrOXYzine (ATARAX/VISTARIL) 50 MG tablet Take 1 tablet (50 mg total) by mouth 3 (three) times daily as needed. (Patient not taking: Reported on 07/01/2020) 30 tablet 0  . oxyCODONE (OXY IR/ROXICODONE) 5 MG immediate release tablet Take by mouth. (Patient not taking: Reported on 06/21/2020)    . traMADol (ULTRAM) 50 MG tablet Take 1 tablet (50 mg total) by mouth every 6 (  six) hours as needed. (Patient not taking: Reported on 07/01/2020) 30 tablet 0  . traZODone (DESYREL) 300 MG tablet Take 1 tablet (300 mg total) by mouth at bedtime as needed for sleep. (Patient not taking: Reported on 07/01/2020) 30 tablet 1    Musculoskeletal: Strength & Muscle Tone: within normal limits Gait & Station: normal Patient leans: N/A  Psychiatric Specialty Exam: Physical Exam Vitals and nursing note reviewed.  Constitutional:      Appearance: He is well-developed.  HENT:     Head: Normocephalic and atraumatic.  Eyes:     Conjunctiva/sclera: Conjunctivae normal.     Pupils: Pupils are equal, round, and reactive to light.  Cardiovascular:     Heart sounds: Normal heart sounds.  Pulmonary:     Effort: Pulmonary effort is normal.  Abdominal:     Palpations: Abdomen is soft.  Musculoskeletal:        General: Normal range of motion.     Cervical back: Normal range of motion.  Skin:    General: Skin is warm and dry.  Neurological:     General: No focal deficit present.     Mental Status: He is alert.  Psychiatric:        Attention and Perception: Attention normal.        Mood and Affect: Mood is anxious and depressed.        Speech: Speech is delayed.        Behavior: Behavior is slowed.         Thought Content: Thought content includes suicidal ideation. Thought content includes suicidal plan.        Cognition and Memory: Cognition is impaired.        Judgment: Judgment is impulsive.     Review of Systems  Constitutional: Negative.   HENT: Negative.   Eyes: Negative.   Respiratory: Negative.   Cardiovascular: Negative.   Gastrointestinal: Negative.   Musculoskeletal: Positive for arthralgias.  Skin: Negative.   Neurological: Negative.   Psychiatric/Behavioral: Positive for behavioral problems, dysphoric mood, sleep disturbance and suicidal ideas.    Blood pressure (!) 100/49, pulse (!) 57, temperature 98.5 F (36.9 C), temperature source Oral, resp. rate 18, height  (1.753 m), weight 74.8 kg, SpO2 100 %.Body mass index is 24.37 kg/m.  General Appearance: Disheveled  Eye Contact:  Fair  Speech:  Normal Rate  Volume:  Decreased  Mood:  Depressed and Dysphoric  Affect:  Congruent  Thought Process:  Coherent  Orientation:  Full (Time, Place, and Person)  Thought Content:  Logical  Suicidal Thoughts:  Yes.  with intent/plan  Homicidal Thoughts:  No  Memory:  Immediate;   Fair Recent;   Fair Remote;   Fair  Judgement:  Fair  Insight:  Fair  Psychomotor Activity:  Normal  Concentration:  Concentration: Poor  Recall:  [employer] of Knowledge:  Fair  Language:  Fair  Akathisia:  No  Handed:  Right  AIMS (if indicated):     Assets:  Desire for Improvement Financial Resources/Insurance  ADL's:  Impaired  Cognition:  WNL  Sleep:        Treatment Plan Summary: Daily contact with patient to assess and evaluate symptoms and progress in treatment, Medication management and Plan This is a 54 year old man with recurrent severe depression and substance abuse.  Multiple medical problems including being HIV positive and having chronic pain.  Recently having worsening suicidal thoughts.  Appears to be very sad down and hopeless.  Reviewed medicine with  him.  Start  back on what should be his appropriate medicine.  Supportive therapy and encouragement.  Discussion of possible longer-term substance abuse treatment.  Patient will be admitted to the hospital as soon as bed space is available.  Disposition: Recommend psychiatric Inpatient admission when medically cleared. Supportive therapy provided about ongoing stressors.  Mordecai Rasmussen, MD 10/18/2020 2:28 PM

## 2020-10-18 NOTE — Progress Notes (Signed)
Patient calm and cooperative during assessment denying SI/HI/AVH. Patient observed interacting appropriately with staff and peers on the unit. Patient didn't have any scheduled medications this evening and didn't request anything PRN. Patient being monitored Q 15 minutes for safety per unit protocol. Patient remains safe on the unit.

## 2020-10-18 NOTE — Progress Notes (Signed)
Pt says that his stressors are his drug abuse, drug dealers, and living in [place]. He is homeless. He says that he received a disability check. His only support is his fiance. He says that he came in with SI however he has has passive ideations now. Ht verbally contracts for safety. He rates depression and anxiety bot 10/10. Pt denies HI and AVH. Pt was oriented to the unit. Pt was educated on care plan and verbalizes understanding. Torrie Mayers RN

## 2020-10-18 NOTE — Consult Note (Signed)
Brief note.  Full note to follow.  Patient with chronic and recurrent depression and substance abuse multiple medical problems.  Active suicidal thoughts.  Needs hospital level treatment.  Orders completed.  Case reviewed with TTS and ER physician

## 2020-10-18 NOTE — Plan of Care (Signed)
Patient new to the unit today, hasn't had time to progress  Problem: Education: Goal: Knowledge of [facility] Education information/materials will improve Outcome: Not Progressing Goal: Emotional status will improve Outcome: Not Progressing Goal: Mental status will improve Outcome: Not Progressing Goal: Verbalization of understanding the information provided will improve Outcome: Not Progressing   Problem: Activity: Goal: Interest or engagement in activities will improve Outcome: Not Progressing Goal: Sleeping patterns will improve Outcome: Not Progressing   Problem: Coping: Goal: Ability to verbalize frustrations and anger appropriately will improve Outcome: Not Progressing Goal: Ability to demonstrate self-control will improve Outcome: Not Progressing   Problem: Health Behavior/Discharge Planning: Goal: Identification of resources available to assist in meeting health care needs will improve Outcome: Not Progressing Goal: Compliance with treatment plan for underlying cause of condition will improve Outcome: Not Progressing   Problem: Physical Regulation: Goal: Ability to maintain clinical measurements within normal limits will improve Outcome: Not Progressing   Problem: Safety: Goal: Periods of time without injury will increase Outcome: Not Progressing   Problem: Education: Goal: Knowledge of disease or condition will improve Outcome: Not Progressing Goal: Understanding of discharge needs will improve Outcome: Not Progressing   Problem: Health Behavior/Discharge Planning: Goal: Ability to identify changes in lifestyle to reduce recurrence of condition will improve Outcome: Not Progressing Goal: Identification of resources available to assist in meeting health care needs will improve Outcome: Not Progressing   Problem: Physical Regulation: Goal: Complications related to the disease process, condition or treatment will be avoided or minimized Outcome: Not  Progressing

## 2020-10-18 NOTE — Tx Team (Signed)
Initial Treatment Plan 10/18/2020 4:38 PM Roy Sauls Lindenbaum Sr. IRJ:[id number]    PATIENT STRESSORS: Health problems Substance abuse   PATIENT STRENGTHS: Ability for insight Licensed conveyancer Motivation for treatment/growth   PATIENT IDENTIFIED PROBLEMS: Substance abuse   depression   Anxiety                  DISCHARGE CRITERIA:  Ability to meet basic life and health needs Improved stabilization in mood, thinking, and/or behavior Need for constant or close observation no longer present Reduction of life-threatening or endangering symptoms to within safe limits Verbal commitment to aftercare and medication compliance  PRELIMINARY DISCHARGE PLAN: Attend aftercare/continuing care group Outpatient therapy Placement in alternative living arrangements  PATIENT/FAMILY INVOLVEMENT: This treatment plan has been presented to and reviewed with the patient, Roy Hellberg Sr. the patient has  been given the opportunity to ask questions and make suggestions.  Chalmers Cater, RN 10/18/2020, 4:38 PM

## 2020-10-18 NOTE — BH Assessment (Addendum)
Patient can come down at 3pm    Call to give report: [phone]  Patient is to be admitted to [facility] by Dr. Neale Burly.  Attending Physician will be. Dr. Neale Burly.   Patient has been assigned to room 307, by [facility] Charge Nurse Lindie Spruce, RN.   Intake Paper Work has been signed and placed on patient chart.  ER staff is aware of the admission: 1. Drinda Butts, ER Secretary  2. Roxan Hockey, ER MD  3. Bill [name] Nurse  4. TH[facility] Patient Access.

## 2020-10-19 ENCOUNTER — Ambulatory Visit: Admit: 2020-10-19 | Payer: MEDICAID

## 2020-10-19 DIAGNOSIS — F332 Major depressive disorder, recurrent severe without psychotic features: Principal | ICD-10-CM

## 2020-10-19 DIAGNOSIS — N390 Urinary tract infection, site not specified: Secondary | ICD-10-CM

## 2020-10-19 LAB — URINE CULTURE

## 2020-10-19 MED ORDER — CIPROFLOXACIN HCL 500 MG PO TABS
500.0000 mg | ORAL_TABLET | Freq: Two times a day (BID) | ORAL | Status: AC
  Administered 2020-10-19 – 2020-10-25 (×14): 500 mg via ORAL
  Filled 2020-10-19 (×14): qty 1

## 2020-10-19 MED ORDER — METRONIDAZOLE 500 MG PO TABS
500.0000 mg | ORAL_TABLET | Freq: Two times a day (BID) | ORAL | Status: AC
  Administered 2020-10-19 – 2020-10-25 (×14): 500 mg via ORAL
  Filled 2020-10-19 (×14): qty 1

## 2020-10-19 NOTE — Plan of Care (Signed)
Patient denies SI/HI/AVH, states to this Clinical research associate that he is feeling better  Problem: Education: Goal: Emotional status will improve Outcome: Progressing Goal: Mental status will improve Outcome: Progressing

## 2020-10-19 NOTE — BHH Suicide Risk Assessment (Signed)
[facility] Admission Suicide Risk Assessment   Nursing information obtained from:  Patient Demographic factors:  Male Current Mental Status:  Self-harm thoughts, Suicidal ideation indicated by others, Suicidal ideation indicated by patient, Suicide plan Loss Factors:  Financial problems / change in socioeconomic status Historical Factors:  NA Risk Reduction Factors:  NA  Total Time spent with patient: 1 hour Principal Problem: Severe recurrent major depression without psychotic features (HCC) Diagnosis:  Principal Problem:   Severe recurrent major depression without psychotic features (HCC) Active Problems:   HIV disease (HCC)   HTN (hypertension)   Cocaine dependence (HCC)   GERD (gastroesophageal reflux disease)   Lower urinary tract infection  Subjective Data: Patient seen one-on-one and again for treatment team. He reports worsening depression and suicidal thoughts with plan to walk in front of a vehicle. He notes he moved to [facility], but his neighborhood is filled with drugs. He admits to relapse on cocaine, and stopping his psychiatric medications. He states he has remained on his HIV medications, and virus remains undetectable. He was recently in the emergency room on Oct 9 for lower urinary tract infection, and did not fill or take his prescriptions for Flagyl or Cipro. He is willing to take his antibiotics, and restart his psychiatric medications. He is also hoping to do a long-term inpatient substance abuse program this admission.   Continued Clinical Symptoms:  Alcohol Use Disorder Identification Test Final Score (AUDIT): 0 The "Alcohol Use Disorders Identification Test", Guidelines for Use in Primary Care, Second Edition.  World Science writer [facility]). Score between 0-7:  no or low risk or alcohol related problems. Score between 8-15:  moderate risk of alcohol related problems. Score between 16-19:  high risk of alcohol related problems. Score 20 or above:  warrants further  diagnostic evaluation for alcohol dependence and treatment.   CLINICAL FACTORS:   Depression:   Anhedonia Hopelessness Impulsivity Insomnia Severe Alcohol/Substance Abuse/Dependencies Unstable or Poor Therapeutic Relationship Previous Psychiatric Diagnoses and Treatments Medical Diagnoses and Treatments/Surgeries   Musculoskeletal: Strength & Muscle Tone: within normal limits Gait & Station: normal Patient leans: N/A  Psychiatric Specialty Exam: Physical Exam Vitals and nursing note reviewed.  Constitutional:      Appearance: Normal appearance.  HENT:     Head: Normocephalic and atraumatic.     Right Ear: External ear normal.     Left Ear: External ear normal.     Nose: Nose normal.     Mouth/Throat:     Mouth: Mucous membranes are moist.     Pharynx: Oropharynx is clear.  Eyes:     Extraocular Movements: Extraocular movements intact.     Conjunctiva/sclera: Conjunctivae normal.     Pupils: Pupils are equal, round, and reactive to light.  Cardiovascular:     Rate and Rhythm: Normal rate.     Pulses: Normal pulses.  Pulmonary:     Effort: Pulmonary effort is normal.     Breath sounds: Normal breath sounds.  Abdominal:     General: Abdomen is flat.     Palpations: Abdomen is soft.  Musculoskeletal:        General: No swelling. Normal range of motion.     Cervical back: Normal range of motion and neck supple.  Skin:    General: Skin is warm and dry.  Neurological:     General: No focal deficit present.     Mental Status: He is alert and oriented to person, place, and time.  Psychiatric:  Attention and Perception: Attention normal.        Mood and Affect: Mood is depressed.        Speech: Speech normal.        Behavior: Behavior is cooperative.        Thought Content: Thought content normal.        Cognition and Memory: Cognition and memory normal.        Judgment: Judgment is impulsive.     Review of Systems  Constitutional: Positive for appetite  change and fatigue.  HENT: Negative for rhinorrhea and sore throat.   Eyes: Negative for photophobia and visual disturbance.  Respiratory: Negative for cough and shortness of breath.   Cardiovascular: Negative for chest pain and palpitations.  Gastrointestinal: Negative for constipation, diarrhea, nausea and vomiting.  Endocrine: Negative for cold intolerance and heat intolerance.  Genitourinary: Positive for dysuria and frequency.  Musculoskeletal: Negative for arthralgias and myalgias.  Skin: Negative for rash and wound.  Allergic/Immunologic: Negative for immunocompromised state.  Neurological: Negative for dizziness and headaches.  Hematological: Negative for adenopathy. Does not bruise/bleed easily.  Psychiatric/Behavioral: Positive for dysphoric mood, sleep disturbance and suicidal ideas.    Blood pressure (!) 149/89, pulse (!) 44, temperature 99.8 F (37.7 C), temperature source Oral, resp. rate 18, SpO2 100 %.There is no height or weight on file to calculate BMI.  General Appearance: Fairly Groomed  Eye Contact:  Good  Speech:  Clear and Coherent  Volume:  Normal  Mood:  Depressed and Irritable  Affect:  Congruent  Thought Process:  Coherent  Orientation:  Full (Time, Place, and Person)  Thought Content:  Logical  Suicidal Thoughts:  Yes.  without intent/plan  Homicidal Thoughts:  No  Memory:  Immediate;   Fair Recent;   Fair Remote;   Fair  Judgement:  Fair  Insight:  Fair  Psychomotor Activity:  Normal  Concentration:  Concentration: Fair and Attention Span: Fair  Recall:  [employer] of Knowledge:  Fair  Language:  Fair  Akathisia:  Negative  Handed:  Right  AIMS (if indicated):     Assets:  Communication Skills Desire for Improvement Financial Resources/Insurance Intimacy Resilience Social Support  ADL's:  Intact  Cognition:  WNL  Sleep:  Number of Hours: 8      COGNITIVE FEATURES THAT CONTRIBUTE TO RISK:  Polarized thinking    SUICIDE RISK:    Moderate:  Frequent suicidal ideation with limited intensity, and duration, some specificity in terms of plans, no associated intent, good self-control, limited dysphoria/symptomatology, some risk factors present, and identifiable protective factors, including available and accessible social support.  PLAN OF CARE: Continue inpatient admission. Will allow patient to sign voluntary. Resume home medication for depression and HIV. Will reorder Cipro 500 mg BID for 7 days and Flagyl 500 mg BID for 7 days for lower urinary tract infection.   I certify that inpatient services furnished can reasonably be expected to improve the patient's condition.   Jesse Sans, MD 10/19/2020, 10:10 AM

## 2020-10-19 NOTE — H&P (Signed)
Psychiatric Admission Assessment Adult  Patient Identification: Roy Ollinger Catalina Sr. MRN:  [id number] Date of Evaluation:  10/19/2020 Chief Complaint:  Severe recurrent major depression without psychotic features (HCC) [F33.2] Principal Diagnosis: Severe recurrent major depression without psychotic features (HCC) Diagnosis:  Principal Problem:   Severe recurrent major depression without psychotic features (HCC) Active Problems:   HIV disease (HCC)   HTN (hypertension)   Cocaine dependence (HCC)   GERD (gastroesophageal reflux disease)   Lower urinary tract infection  History of Present Illness: Patient seen one-on-one and again for treatment team. He reports worsening depression and suicidal thoughts with plan to walk in front of a vehicle. He notes he moved to [facility], but his neighborhood is filled with drugs. He admits to relapse on cocaine, and stopping his psychiatric medications. He states he has remained on his HIV medications, and virus remains undetectable. He was recently in the emergency room on Oct 9 for lower urinary tract infection, and did not fill or take his prescriptions for Flagyl or Cipro. He is willing to take his antibiotics, and restart his psychiatric medications. He is also hoping to do a long-term inpatient substance abuse program this admission.   Associated Signs/Symptoms: Depression Symptoms:  depressed mood, anhedonia, insomnia, fatigue, hopelessness, suicidal thoughts with specific plan, weight loss, decreased appetite, Duration of Depression Symptoms: No data recorded (Hypo) Manic Symptoms:  Impulsivity, Anxiety Symptoms:  Excessive Worry, Psychotic Symptoms:  None Duration of Psychotic Symptoms: No data recorded PTSD Symptoms: Negative Total Time spent with patient: 1 hour  Past Psychiatric History: History: Patient has had several prior hospitalizations similar circumstances.  Recurrent problems with drug abuse complicating his mood and making  it difficult for him to maintain any stability.  Is the patient at risk to self? Yes.    Has the patient been a risk to self in the past 6 months? Yes.    Has the patient been a risk to self within the distant past? Yes.    Is the patient a risk to others? No.  Has the patient been a risk to others in the past 6 months? No.  Has the patient been a risk to others within the distant past? No.   Prior Inpatient Therapy:   Prior Outpatient Therapy:    Alcohol Screening: Patient refused Alcohol Screening Tool: Yes 1. How often do you have a drink containing alcohol?: Never 2. How many drinks containing alcohol do you have on a typical day when you are drinking?: 1 or 2 3. How often do you have six or more drinks on one occasion?: Never AUDIT-C Score: 0 4. How often during the last year have you found that you were not able to stop drinking once you had started?: Never 5. How often during the last year have you failed to do what was normally expected from you because of drinking?: Never 6. How often during the last year have you needed a first drink in the morning to get yourself going after a heavy drinking session?: Never 7. How often during the last year have you had a feeling of guilt of remorse after drinking?: Never 8. How often during the last year have you been unable to remember what happened the night before because you had been drinking?: Never 9. Have you or someone else been injured as a result of your drinking?: No 10. Has a relative or friend or a doctor or another health worker been concerned about your drinking or suggested you cut down?: No  Alcohol Use Disorder Identification Test Final Score (AUDIT): 0 Alcohol Brief Interventions/Follow-up: AUDIT Score <7 follow-up not indicated Substance Abuse History in the last 12 months:  Yes.   Consequences of Substance Abuse: Medical Consequences:  HIV Legal Consequences:  previously imprisoned Withdrawal Symptoms:    Cramps Headaches Previous Psychotropic Medications: Yes  Psychological Evaluations: Yes  Past Medical History:  Past Medical History:  Diagnosis Date  . AIDS (acquired immune deficiency syndrome) (HCC)   . Arthritis   . Asthma   . Bipolar disorder (HCC)   . Bronchitis   . Complication of anesthesia   . Coronary artery disease   . Depression   . Dysrhythmia    1st degree heart block/ brady  . GERD (gastroesophageal reflux disease)   . Hepatitis C    treated  . HIV (human immunodeficiency virus infection) (HCC)   . HTN (hypertension)   . PONV (postoperative nausea and vomiting)     Past Surgical History:  Procedure Laterality Date  . APPLICATION OF WOUND VAC Right 09/01/2019   Procedure: APPLICATION OF WOUND VAC;  Surgeon: Kennedy Bucker, MD;  Location: [facility] ORS;  Service: Orthopedics;  Laterality: Right;  Serial # [id number]  . HERNIA REPAIR Left    inguinal  . TOE SURGERY    . TOE SURGERY Right   . TOTAL HIP ARTHROPLASTY Right 09/01/2019   Procedure: TOTAL HIP ARTHROPLASTY ANTERIOR APPROACH;  Surgeon: Kennedy Bucker, MD;  Location: [facility] ORS;  Service: Orthopedics;  Laterality: Right;   Family History:  Family History  Problem Relation Age of Onset  . Cancer Brother   . Uterine cancer Mother   . CAD Mother   . Hypertension Mother   . Hyperlipidemia Mother    Family Psychiatric  History: Denies Tobacco Screening:   Social History:  Social History   Substance and Sexual Activity  Alcohol Use Yes  . Alcohol/week: 84.0 standard drinks  . Types: 84 Cans of beer per week   Comment: daily     Social History   Substance and Sexual Activity  Drug Use Yes  . Types: Cocaine, "Crack" cocaine   Comment: states last use 07/10/2020    Additional Social History:                           Allergies:   Allergies  Allergen Reactions  . Amlodipine Swelling    Of the tongue  . Lisinopril Swelling  . Lactose Other (See Comments)    GI distress  . Pollen Extract  Other (See Comments)    Itchy eyes and runny nose   Lab Results:  Results for orders placed or performed during the hospital encounter of 10/18/20 (from the past 48 hour(s))  Urinalysis, Routine w reflex microscopic Urine, Clean Catch     Status: Abnormal   Collection Time: 10/18/20  4:36 PM  Result Value Ref Range   Color, Urine YELLOW (A) YELLOW   APPearance CLOUDY (A) CLEAR   Specific Gravity, Urine 1.013 1.005 - 1.030   pH 6.0 5.0 - 8.0   Glucose, UA NEGATIVE NEGATIVE mg/dL   Hgb urine dipstick LARGE (A) NEGATIVE   Bilirubin Urine NEGATIVE NEGATIVE   Ketones, ur NEGATIVE NEGATIVE mg/dL   Protein, ur 30 (A) NEGATIVE mg/dL   Nitrite POSITIVE (A) NEGATIVE   Leukocytes,Ua LARGE (A) NEGATIVE   RBC / HPF >50 (H) 0 - 5 RBC/hpf   WBC, UA >50 (H) 0 - 5 WBC/hpf   Bacteria, UA  RARE (A) NONE SEEN   Squamous Epithelial / LPF 0-5 0 - 5   WBC Clumps PRESENT    Mucus PRESENT     Comment: Performed at [facility], [address] [place], [place] [address]    Blood Alcohol level:  Lab Results  Component Value Date   [facility] <10 10/17/2020   ETH <10 05/15/2020    Metabolic Disorder Labs:  Lab Results  Component Value Date   HGBA1C 5.2 05/27/2018   MPG 102.54 05/27/2018   MPG 114 10/26/2016   No results found for: PROLACTIN Lab Results  Component Value Date   CHOL 158 01/23/2019   TRIG 63 01/23/2019   HDL 43 01/23/2019   CHOLHDL 3.7 01/23/2019   VLDL 13 01/23/2019   LDLCALC 102 (H) 01/23/2019   LDLCALC 67 05/27/2018    Current Medications: Current Facility-Administered Medications  Medication Dose Route Frequency Provider Last Rate Last Admin  . acetaminophen (TYLENOL) tablet 650 mg  650 mg Oral Q6H PRN Clapacs, John T, MD      . albuterol (VENTOLIN HFA) 108 (90 Base) MCG/ACT inhaler 1-2 puff  1-2 puff Inhalation Q4H PRN Clapacs, John T, MD      . alum & mag hydroxide-simeth (MAALOX/MYLANTA) 200-200-20 MG/5ML suspension 30 mL  30 mL Oral Q4H PRN Clapacs, John T, MD       . atorvastatin (LIPITOR) tablet 10 mg  10 mg Oral Daily Clapacs, Jackquline Denmark, MD   10 mg at 10/19/20 0837  . bictegravir-emtricitabine-tenofovir AF (BIKTARVY) 50-200-25 MG per tablet 1 tablet  1 tablet Oral Daily Clapacs, Jackquline Denmark, MD   1 tablet at 10/19/20 [phone]  . ciprofloxacin (CIPRO) tablet 500 mg  500 mg Oral BID Jesse Sans, MD   500 mg at 10/19/20 9518  . citalopram (CELEXA) tablet 40 mg  40 mg Oral Daily Clapacs, Jackquline Denmark, MD   40 mg at 10/19/20 0835  . feeding supplement (ENSURE ENLIVE / ENSURE PLUS) liquid 237 mL  237 mL Oral BID BM Jesse Sans, MD      . gabapentin (NEURONTIN) capsule 300 mg  300 mg Oral BID Clapacs, John T, MD      . hydrOXYzine (ATARAX/VISTARIL) tablet 50 mg  50 mg Oral TID PRN Clapacs, John T, MD      . magnesium hydroxide (MILK OF MAGNESIA) suspension 30 mL  30 mL Oral Daily PRN Clapacs, John T, MD      . metroNIDAZOLE (FLAGYL) tablet 500 mg  500 mg Oral Q12H Jesse Sans, MD   500 mg at 10/19/20 0924  . ondansetron (ZOFRAN) tablet 8 mg  8 mg Oral Q8H PRN Jesse Sans, MD   8 mg at 10/18/20 1659  . traMADol (ULTRAM) tablet 50 mg  50 mg Oral Q6H PRN Clapacs, Jackquline Denmark, MD   50 mg at 10/18/20 1659  . traZODone (DESYREL) tablet 300 mg  300 mg Oral QHS PRN Clapacs, Jackquline Denmark, MD       PTA Medications: Medications Prior to Admission  Medication Sig Dispense Refill Last Dose  . albuterol (VENTOLIN HFA) 108 (90 Base) MCG/ACT inhaler Inhale 1-2 puffs into the lungs every 4 (four) hours as needed for wheezing or shortness of breath. 18 g 1   . atorvastatin (LIPITOR) 10 MG tablet Take 1 tablet (10 mg total) by mouth daily at 6 PM. (Patient not taking: Reported on 07/01/2020) 30 tablet 1   . bictegravir-emtricitabine-tenofovir AF (BIKTARVY) 50-200-25 MG TABS tablet Take 1 tablet  by mouth daily. (Patient not taking: Reported on 07/01/2020) 30 tablet 1   . citalopram (CELEXA) 40 MG tablet Take 1 tablet (40 mg total) by mouth daily. (Patient not taking: Reported on  07/01/2020) 30 tablet 1   . gabapentin (NEURONTIN) 300 MG capsule Take 1 capsule (300 mg total) by mouth 2 (two) times daily. (Patient not taking: Reported on 07/01/2020) 60 capsule 1   . hydrOXYzine (ATARAX/VISTARIL) 50 MG tablet Take 1 tablet (50 mg total) by mouth 3 (three) times daily as needed. (Patient not taking: Reported on 07/01/2020) 30 tablet 0   . oxyCODONE (OXY IR/ROXICODONE) 5 MG immediate release tablet Take by mouth. (Patient not taking: Reported on 06/21/2020)     . traMADol (ULTRAM) 50 MG tablet Take 1 tablet (50 mg total) by mouth every 6 (six) hours as needed. (Patient not taking: Reported on 07/01/2020) 30 tablet 0   . traZODone (DESYREL) 300 MG tablet Take 1 tablet (300 mg total) by mouth at bedtime as needed for sleep. (Patient not taking: Reported on 07/01/2020) 30 tablet 1     Musculoskeletal: Strength & Muscle Tone: within normal limits Gait & Station: normal Patient leans: N/A  Psychiatric Specialty Exam: Physical Exam Vitals and nursing note reviewed.  Constitutional:      Appearance: Normal appearance.  HENT:     Head: Normocephalic and atraumatic.     Right Ear: External ear normal.     Left Ear: External ear normal.     Nose: Nose normal.     Mouth/Throat:     Mouth: Mucous membranes are moist.     Pharynx: Oropharynx is clear.  Eyes:     Extraocular Movements: Extraocular movements intact.     Conjunctiva/sclera: Conjunctivae normal.     Pupils: Pupils are equal, round, and reactive to light.  Cardiovascular:     Rate and Rhythm: Normal rate.     Pulses: Normal pulses.  Pulmonary:     Effort: Pulmonary effort is normal.     Breath sounds: Normal breath sounds.  Abdominal:     General: Abdomen is flat.     Palpations: Abdomen is soft.  Musculoskeletal:        General: No swelling. Normal range of motion.     Cervical back: Normal range of motion and neck supple.  Skin:    General: Skin is warm and dry.  Neurological:     General: No focal deficit  present.     Mental Status: He is alert and oriented to person, place, and time.  Psychiatric:        Attention and Perception: Attention normal.        Mood and Affect: Mood is depressed.        Speech: Speech normal.        Behavior: Behavior is cooperative.        Thought Content: Thought content normal.        Cognition and Memory: Cognition and memory normal.        Judgment: Judgment is impulsive.     Review of Systems  Constitutional: Positive for appetite change and fatigue.  HENT: Negative for rhinorrhea and sore throat.   Eyes: Negative for photophobia and visual disturbance.  Respiratory: Negative for cough and shortness of breath.   Cardiovascular: Negative for chest pain and palpitations.  Gastrointestinal: Negative for constipation, diarrhea, nausea and vomiting.  Endocrine: Negative for cold intolerance and heat intolerance.  Genitourinary: Positive for dysuria and frequency.  Musculoskeletal: Negative for arthralgias  and myalgias.  Skin: Negative for rash and wound.  Allergic/Immunologic: Negative for immunocompromised state.  Neurological: Negative for dizziness and headaches.  Hematological: Negative for adenopathy. Does not bruise/bleed easily.  Psychiatric/Behavioral: Positive for dysphoric mood, sleep disturbance and suicidal ideas.    Blood pressure (!) 149/89, pulse (!) 44, temperature 99.8 F (37.7 C), temperature source Oral, resp. rate 18, SpO2 100 %.There is no height or weight on file to calculate BMI.  General Appearance: Fairly Groomed  Eye Contact:  Good  Speech:  Clear and Coherent  Volume:  Normal  Mood:  Depressed and Irritable  Affect:  Congruent  Thought Process:  Coherent  Orientation:  Full (Time, Place, and Person)  Thought Content:  Logical  Suicidal Thoughts:  Yes.  without intent/plan  Homicidal Thoughts:  No  Memory:  Immediate;   Fair Recent;   Fair Remote;   Fair  Judgement:  Fair  Insight:  Fair  Psychomotor Activity:   Normal  Concentration:  Concentration: Fair and Attention Span: Fair  Recall:  [employer] of Knowledge:  Fair  Language:  Fair  Akathisia:  Negative  Handed:  Right  AIMS (if indicated):     Assets:  Communication Skills Desire for Improvement Financial Resources/Insurance Intimacy Resilience Social Support  ADL's:  Intact  Cognition:  WNL  Sleep:  Number of Hours: 8       Treatment Plan Summary: Daily contact with patient to assess and evaluate symptoms and progress in treatment and Medication management Plan: Continue inpatient admission. Will allow patient to sign voluntary. Resume home medication for depression and HIV. Will reorder Cipro 500 mg BID for 7 days and Flagyl 500 mg BID for 7 days for lower urinary tract infection.   Observation Level/Precautions:  15 minute checks  Laboratory:  UA urine culture  Psychotherapy:    Medications:    Consultations:    Discharge Concerns:    Estimated LOS:  Other:     Physician Treatment Plan for Primary Diagnosis: Severe recurrent major depression without psychotic features (HCC) Long Term Goal(s): Improvement in symptoms so as ready for discharge  Short Term Goals: Ability to identify changes in lifestyle to reduce recurrence of condition will improve, Ability to verbalize feelings will improve, Ability to disclose and discuss suicidal ideas, Ability to demonstrate self-control will improve, Ability to identify and develop effective coping behaviors will improve, Compliance with prescribed medications will improve and Ability to identify triggers associated with substance abuse/mental health issues will improve  Physician Treatment Plan for Secondary Diagnosis: Principal Problem:   Severe recurrent major depression without psychotic features (HCC) Active Problems:   HIV disease (HCC)   HTN (hypertension)   Cocaine dependence (HCC)   GERD (gastroesophageal reflux disease)   Lower urinary tract infection  Long Term Goal(s):  Improvement in symptoms so as ready for discharge  Short Term Goals: Ability to identify changes in lifestyle to reduce recurrence of condition will improve, Ability to verbalize feelings will improve, Ability to disclose and discuss suicidal ideas, Ability to demonstrate self-control will improve, Ability to identify and develop effective coping behaviors will improve, Compliance with prescribed medications will improve and Ability to identify triggers associated with substance abuse/mental health issues will improve  I certify that inpatient services furnished can reasonably be expected to improve the patient's condition.    Jesse Sans, MD 11/3/202110:15 AM

## 2020-10-19 NOTE — Tx Team (Signed)
Interdisciplinary Treatment and Diagnostic Plan Update  10/19/2020 Time of Session: 9:00AM Roy Kay Venables Sr. MRN: [id number]  Principal Diagnosis: <principal problem not specified>  Secondary Diagnoses: Active Problems:   Severe recurrent major depression without psychotic features (HCC)   Current Medications:  Current Facility-Administered Medications  Medication Dose Route Frequency Provider Last Rate Last Admin  . acetaminophen (TYLENOL) tablet 650 mg  650 mg Oral Q6H PRN Clapacs, John T, MD      . albuterol (VENTOLIN HFA) 108 (90 Base) MCG/ACT inhaler 1-2 puff  1-2 puff Inhalation Q4H PRN Clapacs, John T, MD      . alum & mag hydroxide-simeth (MAALOX/MYLANTA) 200-200-20 MG/5ML suspension 30 mL  30 mL Oral Q4H PRN Clapacs, John T, MD      . atorvastatin (LIPITOR) tablet 10 mg  10 mg Oral Daily Clapacs, Roy Denmark, MD   10 mg at 10/19/20 0837  . bictegravir-emtricitabine-tenofovir AF (BIKTARVY) 50-200-25 MG per tablet 1 tablet  1 tablet Oral Daily Clapacs, Roy Denmark, MD   1 tablet at 10/19/20 [phone]  . ciprofloxacin (CIPRO) tablet 500 mg  500 mg Oral BID Roy Sans, MD   500 mg at 10/19/20 5621  . citalopram (CELEXA) tablet 40 mg  40 mg Oral Daily Clapacs, Roy Denmark, MD   40 mg at 10/19/20 0835  . feeding supplement (ENSURE ENLIVE / ENSURE PLUS) liquid 237 mL  237 mL Oral BID BM Roy Sans, MD      . gabapentin (NEURONTIN) capsule 300 mg  300 mg Oral BID Clapacs, John T, MD      . hydrOXYzine (ATARAX/VISTARIL) tablet 50 mg  50 mg Oral TID PRN Clapacs, John T, MD      . magnesium hydroxide (MILK OF MAGNESIA) suspension 30 mL  30 mL Oral Daily PRN Clapacs, John T, MD      . metroNIDAZOLE (FLAGYL) tablet 500 mg  500 mg Oral Q12H Roy Sans, MD   500 mg at 10/19/20 0924  . ondansetron (ZOFRAN) tablet 8 mg  8 mg Oral Q8H PRN Roy Sans, MD   8 mg at 10/18/20 1659  . traMADol (ULTRAM) tablet 50 mg  50 mg Oral Q6H PRN Clapacs, Roy Denmark, MD   50 mg at 10/18/20 1659  . traZODone  (DESYREL) tablet 300 mg  300 mg Oral QHS PRN Clapacs, Roy Denmark, MD       PTA Medications: Medications Prior to Admission  Medication Sig Dispense Refill Last Dose  . albuterol (VENTOLIN HFA) 108 (90 Base) MCG/ACT inhaler Inhale 1-2 puffs into the lungs every 4 (four) hours as needed for wheezing or shortness of breath. 18 g 1   . atorvastatin (LIPITOR) 10 MG tablet Take 1 tablet (10 mg total) by mouth daily at 6 PM. (Patient not taking: Reported on 07/01/2020) 30 tablet 1   . bictegravir-emtricitabine-tenofovir AF (BIKTARVY) 50-200-25 MG TABS tablet Take 1 tablet by mouth daily. (Patient not taking: Reported on 07/01/2020) 30 tablet 1   . citalopram (CELEXA) 40 MG tablet Take 1 tablet (40 mg total) by mouth daily. (Patient not taking: Reported on 07/01/2020) 30 tablet 1   . gabapentin (NEURONTIN) 300 MG capsule Take 1 capsule (300 mg total) by mouth 2 (two) times daily. (Patient not taking: Reported on 07/01/2020) 60 capsule 1   . hydrOXYzine (ATARAX/VISTARIL) 50 MG tablet Take 1 tablet (50 mg total) by mouth 3 (three) times daily as needed. (Patient not taking: Reported on 07/01/2020) 30 tablet 0   .  oxyCODONE (OXY IR/ROXICODONE) 5 MG immediate release tablet Take by mouth. (Patient not taking: Reported on 06/21/2020)     . traMADol (ULTRAM) 50 MG tablet Take 1 tablet (50 mg total) by mouth every 6 (six) hours as needed. (Patient not taking: Reported on 07/01/2020) 30 tablet 0   . traZODone (DESYREL) 300 MG tablet Take 1 tablet (300 mg total) by mouth at bedtime as needed for sleep. (Patient not taking: Reported on 07/01/2020) 30 tablet 1     Patient Stressors: Health problems Substance abuse  Patient Strengths: Ability for insight Licensed conveyancer Motivation for treatment/growth  Treatment Modalities: Medication Management, Group therapy, Case management,  1 to 1 session with clinician, Psychoeducation, Recreational therapy.   Physician Treatment Plan for Primary Diagnosis:  <principal problem not specified> Long Term Goal(s):     Short Term Goals:    Medication Management: Evaluate patient's response, side effects, and tolerance of medication regimen.  Therapeutic Interventions: 1 to 1 sessions, Unit Group sessions and Medication administration.  Evaluation of Outcomes: Not Progressing  Physician Treatment Plan for Secondary Diagnosis: Active Problems:   Severe recurrent major depression without psychotic features (HCC)  Long Term Goal(s):     Short Term Goals:       Medication Management: Evaluate patient's response, side effects, and tolerance of medication regimen.  Therapeutic Interventions: 1 to 1 sessions, Unit Group sessions and Medication administration.  Evaluation of Outcomes: Not Progressing   RN Treatment Plan for Primary Diagnosis: <principal problem not specified> Long Term Goal(s): Knowledge of disease and therapeutic regimen to maintain health will improve  Short Term Goals: Ability to demonstrate self-control, Ability to participate in decision making will improve, Ability to verbalize feelings will improve, Ability to identify and develop effective coping behaviors will improve and Compliance with prescribed medications will improve  Medication Management: RN will administer medications as ordered by provider, will assess and evaluate patient's response and provide education to patient for prescribed medication. RN will report any adverse and/or side effects to prescribing provider.  Therapeutic Interventions: 1 on 1 counseling sessions, Psychoeducation, Medication administration, Evaluate responses to treatment, Monitor vital signs and CBGs as ordered, Perform/monitor CIWA, COWS, AIMS and Fall Risk screenings as ordered, Perform wound care treatments as ordered.  Evaluation of Outcomes: Not Progressing   LCSW Treatment Plan for Primary Diagnosis: <principal problem not specified> Long Term Goal(s): Safe transition to appropriate  next level of care at discharge, Engage patient in therapeutic group addressing interpersonal concerns.  Short Term Goals: Engage patient in aftercare planning with referrals and resources, Increase social support, Increase ability to appropriately verbalize feelings, Increase emotional regulation, Facilitate acceptance of mental health diagnosis and concerns, Facilitate patient progression through stages of change regarding substance use diagnoses and concerns, Identify triggers associated with mental health/substance abuse issues and Increase skills for wellness and recovery  Therapeutic Interventions: Assess for all discharge needs, 1 to 1 time with Social worker, Explore available resources and support systems, Assess for adequacy in community support network, Educate family and significant other(s) on suicide prevention, Complete Psychosocial Assessment, Interpersonal group therapy.  Evaluation of Outcomes: Not Progressing   Progress in Treatment: Attending groups: No. Participating in groups: No. Taking medication as prescribed: Yes. Toleration medication: Yes. Family/Significant other contact made: No, will contact:  when given permission.  Patient understands diagnosis: Yes. Discussing patient identified problems/goals with staff: Yes. Medical problems stabilized or resolved: No. Denies suicidal/homicidal ideation: Yes. Issues/concerns per patient self-inventory: No. Other: None.  New problem(s) identified: No,  Describe:  none.  New Short Term/Long Term Goal(s): detox, medication management for mood stabilization; elimination of SI thoughts; development of comprehensive mental wellness/sobriety plan.  Patient Goals:  "Trying to get into a safe environment."  Discharge Plan or Barriers: Pt expresses desire for placement, specifically 30 day treatment facility.   Reason for Continuation of Hospitalization: Depression Medical Issues Medication stabilization  Estimated Length of  Stay: 1-7 days  Attendees: Patient: Roy [name]. Sees Sr. 10/19/2020 9:25 AM  Physician: Les Pou, MD 10/19/2020 9:25 AM  Nursing: Torrie Mayers, RN 10/19/2020 9:25 AM  RN Care Manager: 10/19/2020 9:25 AM  Social Worker: Penni Homans, MSW, LCSW 10/19/2020 9:25 AM  Recreational Therapist: Garret Reddish, Drue Flirt, LRT  10/19/2020 9:25 AM  Other: Vilma Meckel. Algis Greenhouse, MSW, LCSW, LCAS 10/19/2020 9:25 AM  Other:  10/19/2020 9:25 AM  Other: 10/19/2020 9:25 AM    Scribe for Treatment Team: Glenis Smoker, LCSW 10/19/2020 9:25 AM

## 2020-10-19 NOTE — Progress Notes (Signed)
D- Patient alert and oriented. Affect/mood is calm, cooperative and withdrawn. Pt denies SI, HI, AVH, and pain.   A- Scheduled medications administered to patient, per MD orders. Support and encouragement provided.  Routine safety checks conducted every 15 minutes.  Patient informed to notify staff with problems or concerns.  R- No adverse drug reactions noted. Patient contracts for safety at this time. Patient compliant with medications and treatment plan. Patient receptive, calm, and cooperative. Patient interacts well with others on the unit.  Patient remains safe at this time.  Torrie Mayers RN

## 2020-10-19 NOTE — Plan of Care (Signed)
Pt denies depression, anxiety, SI, HI and AVH. Pt was was educated on care plan and verbalizes understanding. Torrie Mayers RN Problem: Education: Goal: Knowledge of [facility] Education information/materials will improve Outcome: Progressing Goal: Emotional status will improve Outcome: Progressing Goal: Mental status will improve Outcome: Progressing Goal: Verbalization of understanding the information provided will improve Outcome: Progressing   Problem: Activity: Goal: Interest or engagement in activities will improve Outcome: Progressing Goal: Sleeping patterns will improve Outcome: Progressing   Problem: Coping: Goal: Ability to verbalize frustrations and anger appropriately will improve Outcome: Progressing Goal: Ability to demonstrate self-control will improve Outcome: Progressing   Problem: Health Behavior/Discharge Planning: Goal: Identification of resources available to assist in meeting health care needs will improve Outcome: Progressing Goal: Compliance with treatment plan for underlying cause of condition will improve Outcome: Progressing   Problem: Physical Regulation: Goal: Ability to maintain clinical measurements within normal limits will improve Outcome: Progressing   Problem: Safety: Goal: Periods of time without injury will increase Outcome: Progressing   Problem: Education: Goal: Knowledge of disease or condition will improve Outcome: Progressing Goal: Understanding of discharge needs will improve Outcome: Progressing   Problem: Health Behavior/Discharge Planning: Goal: Ability to identify changes in lifestyle to reduce recurrence of condition will improve Outcome: Progressing Goal: Identification of resources available to assist in meeting health care needs will improve Outcome: Progressing   Problem: Physical Regulation: Goal: Complications related to the disease process, condition or treatment will be avoided or minimized Outcome:  Progressing   Problem: Safety: Goal: Ability to remain free from injury will improve Outcome: Progressing   Problem: Education: Goal: Ability to state activities that reduce stress will improve Outcome: Progressing   Problem: Coping: Goal: Ability to identify and develop effective coping behavior will improve Outcome: Progressing   Problem: Self-Concept: Goal: Ability to identify factors that promote anxiety will improve Outcome: Progressing Goal: Level of anxiety will decrease Outcome: Progressing Goal: Ability to modify response to factors that promote anxiety will improve Outcome: Progressing   Problem: Education: Goal: Utilization of techniques to improve thought processes will improve Outcome: Progressing Goal: Knowledge of the prescribed therapeutic regimen will improve Outcome: Progressing   Problem: Activity: Goal: Interest or engagement in leisure activities will improve Outcome: Progressing Goal: Imbalance in normal sleep/wake cycle will improve Outcome: Progressing   Problem: Coping: Goal: Coping ability will improve Outcome: Progressing Goal: Will verbalize feelings Outcome: Progressing   Problem: Health Behavior/Discharge Planning: Goal: Ability to make decisions will improve Outcome: Progressing Goal: Compliance with therapeutic regimen will improve Outcome: Progressing   Problem: Role Relationship: Goal: Will demonstrate positive changes in social behaviors and relationships Outcome: Progressing   Problem: Safety: Goal: Ability to disclose and discuss suicidal ideas will improve Outcome: Progressing Goal: Ability to identify and utilize support systems that promote safety will improve Outcome: Progressing   Problem: Self-Concept: Goal: Will verbalize positive feelings about self Outcome: Progressing Goal: Level of anxiety will decrease Outcome: Progressing

## 2020-10-19 NOTE — Progress Notes (Signed)
Recreation Therapy Notes   Date: 10/19/2020  Time: 9:30 am   Location: Craft room     Behavioral response: N/A   Intervention Topic: Problem Solving    Discussion/Intervention: Patient did not attend group.   Clinical Observations/Feedback:  Patient did not attend group.   Diana Davenport LRT/CTRS       Roy [name] 10/19/2020 12:03 PM

## 2020-10-19 NOTE — Progress Notes (Signed)
Patient calm and cooperative during assessment denying SI/HI/AVH. Patient observed interacting appropriately with staff and peers on the unit. Patient compliant with medication administration per MD orders. Pt given support, encouragement, and education to be active in his treatment plan. Patient being monitored Q 15 minutes for safety per unit protocol. Patient remains safe on the unit. 

## 2020-10-20 MED ORDER — MIRTAZAPINE 15 MG PO TABS
7.5000 mg | ORAL_TABLET | Freq: Every day | ORAL | Status: DC
  Administered 2020-10-20: 7.5 mg via ORAL
  Filled 2020-10-20: qty 1

## 2020-10-20 MED ORDER — OXYMETAZOLINE HCL 0.05 % NA SOLN
1.0000 | Freq: Two times a day (BID) | NASAL | Status: DC | PRN
  Filled 2020-10-20 (×2): qty 15

## 2020-10-20 MED ORDER — TRAZODONE HCL 100 MG PO TABS
200.0000 mg | ORAL_TABLET | Freq: Every evening | ORAL | Status: DC | PRN
  Administered 2020-10-20: 200 mg via ORAL
  Filled 2020-10-20: qty 2

## 2020-10-20 MED ORDER — ENSURE ENLIVE PO LIQD
237.0000 mL | Freq: Three times a day (TID) | ORAL | Status: DC
  Administered 2020-10-20 – 2020-10-28 (×20): 237 mL via ORAL

## 2020-10-20 NOTE — Progress Notes (Signed)
Recreation Therapy Notes  INPATIENT RECREATION THERAPY ASSESSMENT  Patient Details Name: Roy [name] [facility] Sr. MRN: [id number] DOB: [birth year] Today's Date: 10/20/2020       Information Obtained From: Patient  Able to Participate in Assessment/Interview: Yes  Patient Presentation: Responsive  Reason for Admission (Per Patient): Active Symptoms, Substance Abuse  Patient Stressors:    Coping Skills:   Substance Abuse, Art, Other (Comment) (Ride the bus)  Leisure Interests (2+):  Art - Draw, Exercise - Walking  Frequency of Recreation/Participation: Monthly  Awareness of Community Resources:  Yes  Community Resources:  [place], [employer]  Current Use:    If no, Barriers?:    Expressed Interest in [employer] Information:    [place] of Residence:  Film/video editor  Patient Main Form of Transportation: Therapist, music  Patient Strengths:  Drawing  Patient Identified Areas of Improvement:  Get off drugs  Patient Goal for Hospitalization:  Stay off drugs  Current SI (including self-harm):  No  Current HI:  No  Current AVH: No  Staff Intervention Plan: Group Attendance, Collaborate with Interdisciplinary Treatment Team  Consent to Intern Participation: N/A  Roy [name] 10/20/2020, 3:50 PM

## 2020-10-20 NOTE — Progress Notes (Signed)
Pt is alert and oriented to person, place, time and situation. Pt is calm, cooperative, denies suicidal and homicidal ideation, denies feelings of anxiety, reports feelings of depression, rates it 8/10 on a 0-10 scale, 10 being worst. Pt reports suicidal ideation without plan or intent, contracts for safety, reports that he has been using the coping skills to deal with those thoughts and that it has been effective, siting coloring and drawing in the dayroom. Pt is pleasant, flat affect, makes fair eye contact, is medication compliant, appetite is good. Pt has good insight regarding admission triggers able to voice reason for admission. Will continue to monitor pt per Q15 minute face checks and monitor for safety and progress.

## 2020-10-20 NOTE — [facility] Group Notes (Signed)
[facility] Group Notes:  (Nursing/MHT/Case Management/Adjunct)  Date:  10/20/2020  Time:  10:58 PM  Type of Therapy:  Group Therapy  Participation Level:  Active  Participation Quality:  Appropriate  Affect:  Appropriate  Cognitive:  Alert  Insight:  Good  Engagement in Group:  Engaged and his goal is to get off drugs and he is upset with himself because of being back in place like this and he had an ok day.  Modes of Intervention:  Support  Summary of Progress/Problems:  Roy [name] 10/20/2020, 10:58 PM

## 2020-10-20 NOTE — [facility] Counselor (Signed)
CSW faxed referrals for [facility] and [facility].  CSW received confirmation that hte fax was successful.   Penni Homans, MSW, LCSW 10/20/2020 1:06 PM

## 2020-10-20 NOTE — Progress Notes (Signed)
Recreation Therapy Notes   Date: 10/20/2020  Time: 9:30 am   Location: Craft room     Behavioral response: N/A   Intervention Topic: Leisure   Discussion/Intervention: Patient did not attend group.   Clinical Observations/Feedback:  Patient did not attend group.   Cresta Riden LRT/CTRS        Anysa Tacey 10/20/2020 12:43 PM
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
## 2020-10-20 NOTE — Progress Notes (Signed)
[facility] MD Progress Note  10/20/2020 10:33 AM Kule Joyce Gross Rocca Sr.  MRN:  [id number]   Subjective:  Roy [name] seen at bedside this morning. He reports he continues to feel extremely depressed, and has had suicidal thoughts off and on all night and this morning. He is able to contract for safety at this time. He denies homicidal ideations, visual hallucinations, and auditory hallucinations. He reports poor sleep overnight, and feels the trazodone is ineffective. He also reports withdrawal symptoms of chills, night sweats, rhinorrhea. He requests Afrin nasal spay at this time. He also continues to have UTI symptoms of dysuria, frequency, and urgency. He has been medication compliant since admission, and has had no behavioral issues. He continues to desire long-term substance abuse treatment.   Principal Problem: Severe recurrent major depression without psychotic features (HCC) Diagnosis: Principal Problem:   Severe recurrent major depression without psychotic features (HCC) Active Problems:   HIV disease (HCC)   HTN (hypertension)   Cocaine dependence (HCC)   GERD (gastroesophageal reflux disease)   Lower urinary tract infection  Total Time spent with patient: 30 minutes  Past Psychiatric History: Patient has had several prior hospitalizations similar circumstances. Recurrent problems with drug abuse complicating his mood and making it difficult for him to maintain any stability.  Past Medical History:  Past Medical History:  Diagnosis Date  . AIDS (acquired immune deficiency syndrome) (HCC)   . Arthritis   . Asthma   . Bipolar disorder (HCC)   . Bronchitis   . Complication of anesthesia   . Coronary artery disease   . Depression   . Dysrhythmia    1st degree heart block/ brady  . GERD (gastroesophageal reflux disease)   . Hepatitis C    treated  . HIV (human immunodeficiency virus infection) (HCC)   . HTN (hypertension)   . PONV (postoperative nausea and vomiting)     Past  Surgical History:  Procedure Laterality Date  . APPLICATION OF WOUND VAC Right 09/01/2019   Procedure: APPLICATION OF WOUND VAC;  Surgeon: Kennedy Bucker, MD;  Location: [facility] ORS;  Service: Orthopedics;  Laterality: Right;  Serial # [id number]  . HERNIA REPAIR Left    inguinal  . TOE SURGERY    . TOE SURGERY Right   . TOTAL HIP ARTHROPLASTY Right 09/01/2019   Procedure: TOTAL HIP ARTHROPLASTY ANTERIOR APPROACH;  Surgeon: Kennedy Bucker, MD;  Location: [facility] ORS;  Service: Orthopedics;  Laterality: Right;   Family History:  Family History  Problem Relation Age of Onset  . Cancer Brother   . Uterine cancer Mother   . CAD Mother   . Hypertension Mother   . Hyperlipidemia Mother    Family Psychiatric  History: Denies Social History:  Social History   Substance and Sexual Activity  Alcohol Use Yes  . Alcohol/week: 84.0 standard drinks  . Types: 84 Cans of beer per week   Comment: daily     Social History   Substance and Sexual Activity  Drug Use Yes  . Types: Cocaine, "Crack" cocaine   Comment: states last use 07/10/2020    Social History   Socioeconomic History  . Marital status: Divorced    Spouse name: Not on file  . Number of children: Not on file  . Years of education: Not on file  . Highest education level: Not on file  Occupational History  . Not on file  Tobacco Use  . Smoking status: Current Every Day Smoker    Packs/day: 0.25  Types: Cigarettes  . Smokeless tobacco: Never Used  Vaping Use  . Vaping Use: Never used  Substance and Sexual Activity  . Alcohol use: Yes    Alcohol/week: 84.0 standard drinks    Types: 84 Cans of beer per week    Comment: daily  . Drug use: Yes    Types: Cocaine, "Crack" cocaine    Comment: states last use 07/10/2020  . Sexual activity: Yes  Other Topics Concern  . Not on file  Social History Narrative  . Not on file   Social Determinants of Health   Financial Resource Strain:   . Difficulty of Paying Living Expenses: Not  on file  Food Insecurity:   . Worried About Programme researcher, broadcasting/film/video in the Last Year: Not on file  . Ran Out of Food in the Last Year: Not on file  Transportation Needs:   . Lack of Transportation (Medical): Not on file  . Lack of Transportation (Non-Medical): Not on file  Physical Activity:   . Days of Exercise per Week: Not on file  . Minutes of Exercise per Session: Not on file  Stress:   . Feeling of Stress : Not on file  Social Connections:   . Frequency of Communication with Friends and Family: Not on file  . Frequency of Social Gatherings with Friends and Family: Not on file  . Attends Religious Services: Not on file  . Active Member of Clubs or Organizations: Not on file  . Attends Banker Meetings: Not on file  . Marital Status: Not on file   Additional Social History:     Sleep: Poor  Appetite:  Fair  Current Medications: Current Facility-Administered Medications  Medication Dose Route Frequency Provider Last Rate Last Admin  . acetaminophen (TYLENOL) tablet 650 mg  650 mg Oral Q6H PRN Clapacs, John T, MD      . albuterol (VENTOLIN HFA) 108 (90 Base) MCG/ACT inhaler 1-2 puff  1-2 puff Inhalation Q4H PRN Clapacs, John T, MD      . alum & mag hydroxide-simeth (MAALOX/MYLANTA) 200-200-20 MG/5ML suspension 30 mL  30 mL Oral Q4H PRN Clapacs, John T, MD      . atorvastatin (LIPITOR) tablet 10 mg  10 mg Oral Daily Clapacs, Jackquline Denmark, MD   10 mg at 10/20/20 0840  . bictegravir-emtricitabine-tenofovir AF (BIKTARVY) 50-200-25 MG per tablet 1 tablet  1 tablet Oral Daily Clapacs, Jackquline Denmark, MD   1 tablet at 10/20/20 0839  . ciprofloxacin (CIPRO) tablet 500 mg  500 mg Oral BID Jesse Sans, MD   500 mg at 10/20/20 0839  . citalopram (CELEXA) tablet 40 mg  40 mg Oral Daily Clapacs, Jackquline Denmark, MD   40 mg at 10/20/20 0840  . feeding supplement (ENSURE ENLIVE / ENSURE PLUS) liquid 237 mL  237 mL Oral BID BM Jesse Sans, MD   237 mL at 10/19/20 1400  . gabapentin (NEURONTIN)  capsule 300 mg  300 mg Oral BID Clapacs, John T, MD   300 mg at 10/20/20 0841  . hydrOXYzine (ATARAX/VISTARIL) tablet 50 mg  50 mg Oral TID PRN Clapacs, John T, MD      . magnesium hydroxide (MILK OF MAGNESIA) suspension 30 mL  30 mL Oral Daily PRN Clapacs, John T, MD      . metroNIDAZOLE (FLAGYL) tablet 500 mg  500 mg Oral Q12H Jesse Sans, MD   500 mg at 10/20/20 0840  . ondansetron (ZOFRAN) tablet 8 mg  8  mg Oral Q8H PRN Jesse Sans, MD   8 mg at 10/18/20 1659  . traMADol (ULTRAM) tablet 50 mg  50 mg Oral Q6H PRN Clapacs, Jackquline Denmark, MD   50 mg at 10/19/20 2033  . traZODone (DESYREL) tablet 300 mg  300 mg Oral QHS PRN Clapacs, Jackquline Denmark, MD   300 mg at 10/19/20 2033    Lab Results:  Results for orders placed or performed during the hospital encounter of 10/18/20 (from the past 48 hour(s))  Urinalysis, Routine w reflex microscopic Urine, Clean Catch     Status: Abnormal   Collection Time: 10/18/20  4:36 PM  Result Value Ref Range   Color, Urine YELLOW (A) YELLOW   APPearance CLOUDY (A) CLEAR   Specific Gravity, Urine 1.013 1.005 - 1.030   pH 6.0 5.0 - 8.0   Glucose, UA NEGATIVE NEGATIVE mg/dL   Hgb urine dipstick LARGE (A) NEGATIVE   Bilirubin Urine NEGATIVE NEGATIVE   Ketones, ur NEGATIVE NEGATIVE mg/dL   Protein, ur 30 (A) NEGATIVE mg/dL   Nitrite POSITIVE (A) NEGATIVE   Leukocytes,Ua LARGE (A) NEGATIVE   RBC / HPF >50 (H) 0 - 5 RBC/hpf   WBC, UA >50 (H) 0 - 5 WBC/hpf   Bacteria, UA RARE (A) NONE SEEN   Squamous Epithelial / LPF 0-5 0 - 5   WBC Clumps PRESENT    Mucus PRESENT     Comment: Performed at [facility], [address] [place], [place] [address]  Urine Culture     Status: Abnormal   Collection Time: 10/18/20  4:36 PM   Specimen: Urine, Random  Result Value Ref Range   Specimen Description      URINE, RANDOM Performed at [facility], [address], [place], [place] [address]    Special Requests      NONE Performed at [facility], [address] [place], [place] [address]    Culture MULTIPLE SPECIES PRESENT, SUGGEST RECOLLECTION (A)    Report Status 10/19/2020 FINAL     Blood Alcohol level:  Lab Results  Component Value Date   ETH <10 10/17/2020   ETH <10 05/15/2020    Metabolic Disorder Labs: Lab Results  Component Value Date   HGBA1C 5.2 05/27/2018   MPG 102.54 05/27/2018   MPG 114 10/26/2016   No results found for: PROLACTIN Lab Results  Component Value Date   CHOL 158 01/23/2019   TRIG 63 01/23/2019   HDL 43 01/23/2019   CHOLHDL 3.7 01/23/2019   VLDL 13 01/23/2019   LDLCALC 102 (H) 01/23/2019   LDLCALC 67 05/27/2018    Physical Findings: AIMS:  , ,  ,  ,    CIWA:    COWS:     Musculoskeletal: Strength & Muscle Tone: within normal limits Gait & Station: normal Patient leans: N/A  Psychiatric Specialty Exam: Physical Exam Vitals and nursing note reviewed.  Constitutional:      Appearance: Normal appearance.  HENT:     Head: Normocephalic and atraumatic.     Right Ear: External ear normal.     Left Ear: External ear normal.     Nose: Nose normal.     Mouth/Throat:     Mouth: Mucous membranes are moist.     Pharynx: Oropharynx is clear.  Eyes:     Extraocular Movements: Extraocular movements intact.     Conjunctiva/sclera: Conjunctivae normal.     Pupils: Pupils are equal, round, and reactive to light.  Cardiovascular:  Rate and Rhythm: Normal rate.     Pulses: Normal pulses.  Pulmonary:     Effort: Pulmonary effort is normal.     Breath sounds: Normal breath sounds.  Abdominal:     General: Abdomen is flat.     Palpations: Abdomen is soft.  Musculoskeletal:        General: No swelling. Normal range of motion.     Cervical back: Normal range of motion and neck supple.  Skin:    General: Skin is warm and dry.  Neurological:     General: No focal deficit present.     Mental Status: He is alert and oriented to person, place, and time.  Psychiatric:         Attention and Perception: Attention normal.        Mood and Affect: Mood is depressed.        Speech: Speech normal.        Behavior: Behavior is withdrawn.        Thought Content: Thought content includes suicidal ideation.        Cognition and Memory: Cognition and memory normal.        Judgment: Judgment normal.     Review of Systems  Constitutional: Positive for chills and fatigue.  HENT: Positive for congestion. Negative for sinus pressure, sinus pain and sore throat.   Eyes: Negative for photophobia and visual disturbance.  Respiratory: Negative for cough and shortness of breath.   Cardiovascular: Negative for chest pain and palpitations.  Gastrointestinal: Negative for constipation, diarrhea, nausea and vomiting.  Endocrine: Negative for cold intolerance and heat intolerance.  Genitourinary: Positive for dysuria, flank pain and frequency.  Musculoskeletal: Positive for arthralgias and myalgias.  Skin: Negative for rash and wound.  Allergic/Immunologic: Negative for immunocompromised state.  Neurological: Positive for headaches. Negative for dizziness.  Hematological: Negative for adenopathy. Does not bruise/bleed easily.  Psychiatric/Behavioral: Positive for dysphoric mood, sleep disturbance and suicidal ideas. Negative for hallucinations.    Blood pressure 122/75, pulse 60, temperature 98.7 F (37.1 C), temperature source Oral, resp. rate 18, SpO2 99 %.There is no height or weight on file to calculate BMI.  General Appearance: Fairly Groomed  Eye Contact:  Good  Speech:  Clear and Coherent  Volume:  Normal  Mood:  Depressed and Dysphoric  Affect:  Congruent  Thought Process:  Coherent and Linear  Orientation:  Full (Time, Place, and Person)  Thought Content:  Logical  Suicidal Thoughts:  Yes.  without intent/plan  Homicidal Thoughts:  No  Memory:  Immediate;   Fair Recent;   Fair Remote;   Fair  Judgement:  Intact  Insight:  Fair  Psychomotor Activity:  Normal   Concentration:  Concentration: Fair and Attention Span: Fair  Recall:  [employer] of Knowledge:  Fair  Language:  Fair  Akathisia:  Negative  Handed:  Right  AIMS (if indicated):     Assets:  Communication Skills Desire for Improvement Financial Resources/Insurance Resilience Social Support  ADL's:  Intact  Cognition:  WNL  Sleep:  Number of Hours: 6.75     Treatment Plan Summary: Daily contact with patient to assess and evaluate symptoms and progress in treatment, Medication management and Plan Start Remeron 7.5 mg nightly for insomnia, poor appetite, and mood. Reduce Trazodone to 200 mg nightly PRN. Continue Celexa 40 mg daily for mood. Continue Cipro and Flagyl for lower urinary tract infection. No signs of urosepsis as patient has no white count, fever, or tachycardia. Continue to  monitor.   Jesse Sans, MD 10/20/2020, 10:33 AM

## 2020-10-20 NOTE — Progress Notes (Signed)
Patient alert and oriented x 4 affect is blunted, thoughts are organized and coherent no distress noted, he is interacting appropriately with peers and staff, he denies SI/HI/AVH , 15 minutes safety checks maintained will continue to monitor.

## 2020-10-20 NOTE — [facility] Suicide Risk Assessment (Signed)
[facility] INPATIENT:  Family/Significant Other Suicide Prevention Education  Suicide Prevention Education:  Education Completed;  Roy [name], fiance, [phone] has been identified by the patient as the family member/significant other with whom the patient will be residing, and identified as the person(s) who will aid the patient in the event of a mental health crisis (suicidal ideations/suicide attempt).  With written consent from the patient, the family member/significant other has been provided the following suicide prevention education, prior to the and/or following the discharge of the patient.  The suicide prevention education provided includes the following:  Suicide risk factors  Suicide prevention and interventions  National Suicide Hotline telephone number  [facility] assessment telephone number  [facility] Emergency Assistance 911  [facility] and/or Residential Mobile Crisis Unit telephone number  Request made of family/significant other to:  Remove weapons (e.g., guns, rifles, knives), all items previously/currently identified as safety concern.    Remove drugs/medications (over-the-counter, prescriptions, illicit drugs), all items previously/currently identified as a safety concern.  The family member/significant other verbalizes understanding of the suicide prevention education information provided.  The family member/significant other agrees to remove the items of safety concern listed above.  Fiance reports "he is battling severe depression and drug use". She reports that the patient "has been all over the place with the mood swings".  She denies that patient is a danger to others but may be a danger to self due to frequent threats to harm himself.  She denies that the patient has any access to weapons.   Roy [name] 10/20/2020, 11:26 AM

## 2020-10-20 NOTE — [facility] Group Notes (Signed)
LCSW Group Therapy Note  10/20/2020 2:41 PM  Type of Therapy/Topic:  Group Therapy:  Balance in Life  Participation Level:  Did Not Attend  Description of Group:    This group will address the concept of balance and how it feels and looks when one is unbalanced. Patients will be encouraged to process areas in their lives that are out of balance and identify reasons for remaining unbalanced. Facilitators will guide patients in utilizing problem-solving interventions to address and correct the stressor making their life unbalanced. Understanding and applying boundaries will be explored and addressed for obtaining and maintaining a balanced life. Patients will be encouraged to explore ways to assertively make their unbalanced needs known to significant others in their lives, using other group members and facilitator for support and feedback.  Therapeutic Goals: 1. Patient will identify two or more emotions or situations they have that consume much of in their lives. 2. Patient will identify signs/triggers that life has become out of balance:  3. Patient will identify two ways to set boundaries in order to achieve balance in their lives:  4. Patient will demonstrate ability to communicate their needs through discussion and/or role plays  Summary of Patient Progress: X  Therapeutic Modalities:   Cognitive Behavioral Therapy Solution-Focused Therapy Assertiveness Training  Simona Huh R. Algis Greenhouse, MSW, LCSW, LCAS 10/20/2020 2:41 PM

## 2020-10-20 NOTE — Progress Notes (Signed)
Recreation Therapy Notes  INPATIENT RECREATION TR PLAN  Patient Details Name: Roy [name] [facility] Sr. MRN: [id number] DOB: [birth year] Today's Date: 10/20/2020  Rec Therapy Plan Is patient appropriate for Therapeutic Recreation?: Yes Treatment times per week: at least 3 Estimated Length of Stay: 5-7 days TR Treatment/Interventions: Group participation (Comment)  Discharge Criteria    Discharge Summary     Magdaleno Lortie 10/20/2020, 3:50 PM

## 2020-10-20 NOTE — [facility] Counselor (Signed)
CSW called [facility] to obtain any referral information for the men's halfway house.  CSW left HIPAA compliant voicemail.   Penni Homans, MSW, LCSW 10/20/2020 3:45 PM

## 2020-10-21 MED ORDER — OXYMETAZOLINE HCL 0.05 % NA SOLN
1.0000 | Freq: Every day | NASAL | Status: DC
  Administered 2020-10-21: 1 via NASAL
  Filled 2020-10-21: qty 15

## 2020-10-21 MED ORDER — TRAZODONE HCL 50 MG PO TABS
150.0000 mg | ORAL_TABLET | Freq: Every evening | ORAL | Status: DC | PRN
  Administered 2020-10-21 – 2020-10-27 (×5): 150 mg via ORAL
  Filled 2020-10-21 (×6): qty 1

## 2020-10-21 MED ORDER — MIRTAZAPINE 15 MG PO TABS
7.5000 mg | ORAL_TABLET | Freq: Every day | ORAL | Status: DC
  Administered 2020-10-21 – 2020-10-27 (×7): 7.5 mg via ORAL
  Filled 2020-10-21 (×7): qty 1

## 2020-10-21 NOTE — Progress Notes (Signed)
Recreation Therapy Notes   Date: 10/21/2020  Time: 9:30 am   Location: Craft room     Behavioral response: N/A   Intervention Topic: Emotions   Discussion/Intervention: Patient did not attend group.   Clinical Observations/Feedback:  Patient did not attend group.   Josten Warmuth LRT/CTRS           Shelley Pooley 10/21/2020 11:57 AM

## 2020-10-21 NOTE — [facility] Counselor (Signed)
LCSW Group Therapy Note  10/21/2020 2:49 PM  Type of Therapy/Topic:  Group Therapy:  Emotion Regulation  Participation Level:  Active   Description of Group:   The purpose of this group is to assist patients in learning to regulate negative emotions and experience positive emotions. Patients will be guided to discuss ways in which they have been vulnerable to their negative emotions. These vulnerabilities will be juxtaposed with experiences of positive emotions or situations, and patients will be challenged to use positive emotions to combat negative ones. Special emphasis will be placed on coping with negative emotions in conflict situations, and patients will process healthy conflict resolution skills.  Therapeutic Goals: 1. Patient will identify two positive emotions or experiences to reflect on in order to balance out negative emotions 2. Patient will label two or more emotions that they find the most difficult to experience 3. Patient will demonstrate positive conflict resolution skills through discussion and/or role plays  Summary of Patient Progress: Patient was present in group. 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  He reports that he has struggled with substance use and remaining from areas and people that can be a trigger from him.  He was able to participate in discussion on barriers for healthy coping skills being used effectively.    Therapeutic Modalities:   Cognitive Behavioral Therapy Feelings Identification Dialectical Behavioral Therapy  Penni Homans, MSW, LCSW 10/21/2020 2:49 PM

## 2020-10-21 NOTE — [facility] Counselor (Addendum)
CSWs checked in with referrals to [facility] and [facility].   [facility] CSW provided authorization number and checked the status of referral and was informed that it remains under review.  CSW spoke with [facility].  [facility] CSW called and intake worker Drema Pry was not in.  However a call will be returned with more information.   [facility] No bed are available at this time.  Penni Homans, MSW, LCSW 10/21/2020 11:31 AM

## 2020-10-21 NOTE — Plan of Care (Signed)
Patient got up for breakfast then went back to bed reporting that he did not get enough sleep last night. Presented to the medication room reporting that his mains concern is his UTI. He is also worried about housing. Patient refused Gabapentin this morning. He is otherwise cooperative. Currently in bed resting. Safety monitored as expected.

## 2020-10-21 NOTE — [facility] Counselor (Signed)
CSW refaxed referral information to [facility] as they had stated they were unable to find it.   CSW called to confirm receipt but was forwarded to [employer]. Voicemail was left with contact information for follow through.   Vilma Meckel. Algis Greenhouse, MSW, LCSW, LCAS 10/21/2020 2:44 PM

## 2020-10-21 NOTE — Progress Notes (Signed)
Patient calm and cooperative during assessment denying SI/HI/AVH. Patient observed interacting appropriately with staff and peers on the unit. Patient compliant with medication administration per MD orders. Pt given support, encouragement, and education to be active in his treatment plan. Patient being monitored Q 15 minutes for safety per unit protocol. Patient remains safe on the unit.

## 2020-10-21 NOTE — Progress Notes (Signed)
[facility] MD Progress Note  10/21/2020 12:54 PM Roy Joyce Gross Ronan Sr.  MRN:  [id number]   Subjective:  Roy [name] seen at bedside this morning. He reports he continues to feel extremely depressed. He states he is not currently suicidal, but had suicidal thoughts this morning. He is able to contract for safety at this time. He denies homicidal ideations, visual hallucinations, and auditory hallucinations. He reports that he slept very well overnight with Mirtazapine, but notes some lingering daytime somnolence. Will adjust medication to be given at 8PM.  He also continues to have UTI symptoms of dysuria, frequency, and urgency. He has been medication compliant since admission, and has had no behavioral issues. He continues to desire long-term substance abuse treatment.   Principal Problem: Severe recurrent major depression without psychotic features (HCC) Diagnosis: Principal Problem:   Severe recurrent major depression without psychotic features (HCC) Active Problems:   HIV disease (HCC)   HTN (hypertension)   Cocaine dependence (HCC)   GERD (gastroesophageal reflux disease)   Lower urinary tract infection  Total Time spent with patient: 30 minutes  Past Psychiatric History: Patient has had several prior hospitalizations similar circumstances. Recurrent problems with drug abuse complicating his mood and making it difficult for him to maintain any stability.  Past Medical History:  Past Medical History:  Diagnosis Date  . AIDS (acquired immune deficiency syndrome) (HCC)   . Arthritis   . Asthma   . Bipolar disorder (HCC)   . Bronchitis   . Complication of anesthesia   . Coronary artery disease   . Depression   . Dysrhythmia    1st degree heart block/ brady  . GERD (gastroesophageal reflux disease)   . Hepatitis C    treated  . HIV (human immunodeficiency virus infection) (HCC)   . HTN (hypertension)   . PONV (postoperative nausea and vomiting)     Past Surgical History:   Procedure Laterality Date  . APPLICATION OF WOUND VAC Right 09/01/2019   Procedure: APPLICATION OF WOUND VAC;  Surgeon: Kennedy Bucker, MD;  Location: [facility] ORS;  Service: Orthopedics;  Laterality: Right;  Serial # [id number]  . HERNIA REPAIR Left    inguinal  . TOE SURGERY    . TOE SURGERY Right   . TOTAL HIP ARTHROPLASTY Right 09/01/2019   Procedure: TOTAL HIP ARTHROPLASTY ANTERIOR APPROACH;  Surgeon: Kennedy Bucker, MD;  Location: [facility] ORS;  Service: Orthopedics;  Laterality: Right;   Family History:  Family History  Problem Relation Age of Onset  . Cancer Brother   . Uterine cancer Mother   . CAD Mother   . Hypertension Mother   . Hyperlipidemia Mother    Family Psychiatric  History: Denies Social History:  Social History   Substance and Sexual Activity  Alcohol Use Yes  . Alcohol/week: 84.0 standard drinks  . Types: 84 Cans of beer per week   Comment: daily     Social History   Substance and Sexual Activity  Drug Use Yes  . Types: Cocaine, "Crack" cocaine   Comment: states last use 07/10/2020    Social History   Socioeconomic History  . Marital status: Divorced    Spouse name: Not on file  . Number of children: Not on file  . Years of education: Not on file  . Highest education level: Not on file  Occupational History  . Not on file  Tobacco Use  . Smoking status: Current Every Day Smoker    Packs/day: 0.25    Types: Cigarettes  .  Smokeless tobacco: Never Used  Vaping Use  . Vaping Use: Never used  Substance and Sexual Activity  . Alcohol use: Yes    Alcohol/week: 84.0 standard drinks    Types: 84 Cans of beer per week    Comment: daily  . Drug use: Yes    Types: Cocaine, "Crack" cocaine    Comment: states last use 07/10/2020  . Sexual activity: Yes  Other Topics Concern  . Not on file  Social History Narrative  . Not on file   Social Determinants of Health   Financial Resource Strain:   . Difficulty of Paying Living Expenses: Not on file  Food  Insecurity:   . Worried About Programme researcher, broadcasting/film/video in the Last Year: Not on file  . Ran Out of Food in the Last Year: Not on file  Transportation Needs:   . Lack of Transportation (Medical): Not on file  . Lack of Transportation (Non-Medical): Not on file  Physical Activity:   . Days of Exercise per Week: Not on file  . Minutes of Exercise per Session: Not on file  Stress:   . Feeling of Stress : Not on file  Social Connections:   . Frequency of Communication with Friends and Family: Not on file  . Frequency of Social Gatherings with Friends and Family: Not on file  . Attends Religious Services: Not on file  . Active Member of Clubs or Organizations: Not on file  . Attends Banker Meetings: Not on file  . Marital Status: Not on file   Additional Social History:     Sleep: Poor  Appetite:  Fair  Current Medications: Current Facility-Administered Medications  Medication Dose Route Frequency Provider Last Rate Last Admin  . acetaminophen (TYLENOL) tablet 650 mg  650 mg Oral Q6H PRN Clapacs, John T, MD      . albuterol (VENTOLIN HFA) 108 (90 Base) MCG/ACT inhaler 1-2 puff  1-2 puff Inhalation Q4H PRN Clapacs, John T, MD      . alum & mag hydroxide-simeth (MAALOX/MYLANTA) 200-200-20 MG/5ML suspension 30 mL  30 mL Oral Q4H PRN Clapacs, John T, MD      . atorvastatin (LIPITOR) tablet 10 mg  10 mg Oral Daily Clapacs, Jackquline Denmark, MD   10 mg at 10/21/20 0831  . bictegravir-emtricitabine-tenofovir AF (BIKTARVY) 50-200-25 MG per tablet 1 tablet  1 tablet Oral Daily Clapacs, Jackquline Denmark, MD   1 tablet at 10/21/20 0827  . ciprofloxacin (CIPRO) tablet 500 mg  500 mg Oral BID Jesse Sans, MD   500 mg at 10/21/20 0827  . citalopram (CELEXA) tablet 40 mg  40 mg Oral Daily Clapacs, Jackquline Denmark, MD   40 mg at 10/21/20 0827  . feeding supplement (ENSURE ENLIVE / ENSURE PLUS) liquid 237 mL  237 mL Oral TID BM Jesse Sans, MD   237 mL at 10/21/20 0941  . hydrOXYzine (ATARAX/VISTARIL) tablet 50  mg  50 mg Oral TID PRN Clapacs, Jackquline Denmark, MD   50 mg at 10/20/20 2111  . magnesium hydroxide (MILK OF MAGNESIA) suspension 30 mL  30 mL Oral Daily PRN Clapacs, John T, MD      . metroNIDAZOLE (FLAGYL) tablet 500 mg  500 mg Oral Q12H Jesse Sans, MD   500 mg at 10/21/20 0827  . mirtazapine (REMERON) tablet 7.5 mg  7.5 mg Oral QHS Jesse Sans, MD      . ondansetron [facility]) tablet 8 mg  8 mg Oral Q8H PRN  Jesse Sans, MD   8 mg at 10/18/20 1659  . oxymetazoline (AFRIN) 0.05 % nasal spray 1 spray  1 spray Each Nare QHS Jesse Sans, MD      . traMADol Janean Sark) tablet 50 mg  50 mg Oral Q6H PRN Clapacs, Jackquline Denmark, MD   50 mg at 10/19/20 2033  . traZODone (DESYREL) tablet 200 mg  200 mg Oral QHS PRN Jesse Sans, MD   200 mg at 10/20/20 2111    Lab Results:  No results found for this or any previous visit (from the past 48 hour(s)).  Blood Alcohol level:  Lab Results  Component Value Date   ETH <10 10/17/2020   ETH <10 05/15/2020    Metabolic Disorder Labs: Lab Results  Component Value Date   HGBA1C 5.2 05/27/2018   MPG 102.54 05/27/2018   MPG 114 10/26/2016   No results found for: PROLACTIN Lab Results  Component Value Date   CHOL 158 01/23/2019   TRIG 63 01/23/2019   HDL 43 01/23/2019   CHOLHDL 3.7 01/23/2019   VLDL 13 01/23/2019   LDLCALC 102 (H) 01/23/2019   LDLCALC 67 05/27/2018    Physical Findings: AIMS:  , ,  ,  ,    CIWA:    COWS:     Musculoskeletal: Strength & Muscle Tone: within normal limits Gait & Station: normal Patient leans: N/A  Psychiatric Specialty Exam: Physical Exam Vitals and nursing note reviewed.  Constitutional:      Appearance: Normal appearance.  HENT:     Head: Normocephalic and atraumatic.     Right Ear: External ear normal.     Left Ear: External ear normal.     Nose: Nose normal.     Mouth/Throat:     Mouth: Mucous membranes are moist.     Pharynx: Oropharynx is clear.  Eyes:     Extraocular Movements:  Extraocular movements intact.     Conjunctiva/sclera: Conjunctivae normal.     Pupils: Pupils are equal, round, and reactive to light.  Cardiovascular:     Rate and Rhythm: Normal rate.     Pulses: Normal pulses.  Pulmonary:     Effort: Pulmonary effort is normal.     Breath sounds: Normal breath sounds.  Abdominal:     General: Abdomen is flat.     Palpations: Abdomen is soft.  Musculoskeletal:        General: No swelling. Normal range of motion.     Cervical back: Normal range of motion and neck supple.  Skin:    General: Skin is warm and dry.  Neurological:     General: No focal deficit present.     Mental Status: He is alert and oriented to person, place, and time.  Psychiatric:        Attention and Perception: Attention normal.        Mood and Affect: Mood is depressed.        Speech: Speech normal.        Behavior: Behavior is withdrawn.        Thought Content: Thought content includes suicidal ideation.        Cognition and Memory: Cognition and memory normal.        Judgment: Judgment normal.     Review of Systems  Constitutional: Positive for chills and fatigue.  HENT: Positive for congestion. Negative for sinus pressure, sinus pain and sore throat.   Eyes: Negative for photophobia and visual disturbance.  Respiratory: Negative for  cough and shortness of breath.   Cardiovascular: Negative for chest pain and palpitations.  Gastrointestinal: Negative for constipation, diarrhea, nausea and vomiting.  Endocrine: Negative for cold intolerance and heat intolerance.  Genitourinary: Positive for dysuria, flank pain and frequency.  Musculoskeletal: Positive for arthralgias and myalgias.  Skin: Negative for rash and wound.  Allergic/Immunologic: Negative for immunocompromised state.  Neurological: Positive for headaches. Negative for dizziness.  Hematological: Negative for adenopathy. Does not bruise/bleed easily.  Psychiatric/Behavioral: Positive for dysphoric mood, sleep  disturbance and suicidal ideas. Negative for hallucinations.    Blood pressure 130/72, pulse 61, temperature 98.4 F (36.9 C), temperature source Oral, resp. rate 17, SpO2 100 %.There is no height or weight on file to calculate BMI.  General Appearance: Fairly Groomed  Eye Contact:  Good  Speech:  Clear and Coherent  Volume:  Normal  Mood:  Depressed and Dysphoric  Affect:  Congruent  Thought Process:  Coherent and Linear  Orientation:  Full (Time, Place, and Person)  Thought Content:  Logical  Suicidal Thoughts:  Yes.  without intent/plan  Homicidal Thoughts:  No  Memory:  Immediate;   Fair Recent;   Fair Remote;   Fair  Judgement:  Intact  Insight:  Fair  Psychomotor Activity:  Normal  Concentration:  Concentration: Fair and Attention Span: Fair  Recall:  [employer] of Knowledge:  Fair  Language:  Fair  Akathisia:  Negative  Handed:  Right  AIMS (if indicated):     Assets:  Communication Skills Desire for Improvement Financial Resources/Insurance Resilience Social Support  ADL's:  Intact  Cognition:  WNL  Sleep:  Number of Hours: 8.5     Treatment Plan Summary: Daily contact with patient to assess and evaluate symptoms and progress in treatment, Medication management and Plan continue Remeron 7.5 mg nightly for insomnia, poor appetite, and mood. Reduce Trazodone to 150 mg nightly PRN. Continue Celexa 40 mg daily for mood. Continue Cipro and Flagyl for lower urinary tract infection. No signs of urosepsis as patient has no white count, fever, or tachycardia. Continue to monitor.   Jesse Sans, MD 10/21/2020, 12:54 PM

## 2020-10-21 NOTE — Plan of Care (Signed)
Patient stated he is feeling better just hopeful that he can sleep better   Problem: Education: Goal: Emotional status will improve Outcome: Progressing Goal: Mental status will improve Outcome: Progressing

## 2020-10-22 DIAGNOSIS — F332 Major depressive disorder, recurrent severe without psychotic features: Secondary | ICD-10-CM | POA: Diagnosis not present

## 2020-10-22 MED ORDER — OXYMETAZOLINE HCL 0.05 % NA SOLN
1.0000 | Freq: Two times a day (BID) | NASAL | Status: DC | PRN
  Administered 2020-10-24 – 2020-10-25 (×2): 1 via NASAL
  Filled 2020-10-22: qty 15

## 2020-10-22 NOTE — [facility] Group Notes (Signed)
LCSW Aftercare Discharge Planning Group Note   10/22/2020 1:20pm-2:00pm   Type of Group and Topic: Psychoeducational Group:  Discharge Planning  Participation Level:  Did Not Attend  Description of Group  Discharge planning group reviews patient's anticipated discharge plans and assists patients to anticipate and address any barriers to wellness/recovery in the community.  Suicide prevention education is reviewed with patients in group.   Therapeutic Goals 1. Patients will state their anticipated discharge plan and mental health aftercare 2. Patients will identify potential barriers to wellness in the community setting 3. Patients will engage in problem solving, solution focused discussion of ways to anticipate and address barriers to wellness/recovery   Summary of Patient Progress: Did not attend     Therapeutic Modalities: Motivational Interviewing    Susa Simmonds, LCSWA 10/22/2020 2:42 PM

## 2020-10-22 NOTE — Progress Notes (Signed)
[facility] MD Progress Note  10/22/2020 10:39 AM Javarus Joyce Gross Entsminger Sr.  MRN:  [id number]   Subjective:   I'm doing better, sometimes I hear voices" Mr Saffle seen in the dayroom. Marland Kitchen He reports feeling depressed but better than yesterday, reporst hearing voices " calling my name", breakfast is ready". He states he is not currently suicidal, but had suicidal thoughts on and off, denies suicidal intent and plan. Marland Kitchen He is able to contract for safety at this time. He denies homicidal ideations, visual hallucinations.  Sleeping well, taking meds, denies any side effects. .   He has been medication compliant since admission, and has had no behavioral issues. He continues to desire long-term substance abuse treatment.   Principal Problem: Severe recurrent major depression without psychotic features (HCC) Diagnosis: Principal Problem:   Severe recurrent major depression without psychotic features (HCC) Active Problems:   HIV disease (HCC)   HTN (hypertension)   Cocaine dependence (HCC)   GERD (gastroesophageal reflux disease)   Lower urinary tract infection  Total Time spent with patient: 30 minutes  Past Psychiatric History: Patient has had several prior hospitalizations similar circumstances. Recurrent problems with drug abuse complicating his mood and making it difficult for him to maintain any stability.  Past Medical History:  Past Medical History:  Diagnosis Date  . AIDS (acquired immune deficiency syndrome) (HCC)   . Arthritis   . Asthma   . Bipolar disorder (HCC)   . Bronchitis   . Complication of anesthesia   . Coronary artery disease   . Depression   . Dysrhythmia    1st degree heart block/ brady  . GERD (gastroesophageal reflux disease)   . Hepatitis C    treated  . HIV (human immunodeficiency virus infection) (HCC)   . HTN (hypertension)   . PONV (postoperative nausea and vomiting)     Past Surgical History:  Procedure Laterality Date  . APPLICATION OF WOUND VAC Right  09/01/2019   Procedure: APPLICATION OF WOUND VAC;  Surgeon: Kennedy Bucker, MD;  Location: [facility] ORS;  Service: Orthopedics;  Laterality: Right;  Serial # [id number]  . HERNIA REPAIR Left    inguinal  . TOE SURGERY    . TOE SURGERY Right   . TOTAL HIP ARTHROPLASTY Right 09/01/2019   Procedure: TOTAL HIP ARTHROPLASTY ANTERIOR APPROACH;  Surgeon: Kennedy Bucker, MD;  Location: [facility] ORS;  Service: Orthopedics;  Laterality: Right;   Family History:  Family History  Problem Relation Age of Onset  . Cancer Brother   . Uterine cancer Mother   . CAD Mother   . Hypertension Mother   . Hyperlipidemia Mother    Family Psychiatric  History: Denies Social History:  Social History   Substance and Sexual Activity  Alcohol Use Yes  . Alcohol/week: 84.0 standard drinks  . Types: 84 Cans of beer per week   Comment: daily     Social History   Substance and Sexual Activity  Drug Use Yes  . Types: Cocaine, "Crack" cocaine   Comment: states last use 07/10/2020    Social History   Socioeconomic History  . Marital status: Divorced    Spouse name: Not on file  . Number of children: Not on file  . Years of education: Not on file  . Highest education level: Not on file  Occupational History  . Not on file  Tobacco Use  . Smoking status: Current Every Day Smoker    Packs/day: 0.25    Types: Cigarettes  . Smokeless tobacco: Never  Used  Vaping Use  . Vaping Use: Never used  Substance and Sexual Activity  . Alcohol use: Yes    Alcohol/week: 84.0 standard drinks    Types: 84 Cans of beer per week    Comment: daily  . Drug use: Yes    Types: Cocaine, "Crack" cocaine    Comment: states last use 07/10/2020  . Sexual activity: Yes  Other Topics Concern  . Not on file  Social History Narrative  . Not on file   Social Determinants of Health   Financial Resource Strain:   . Difficulty of Paying Living Expenses: Not on file  Food Insecurity:   . Worried About Programme researcher, broadcasting/film/video in the Last  Year: Not on file  . Ran Out of Food in the Last Year: Not on file  Transportation Needs:   . Lack of Transportation (Medical): Not on file  . Lack of Transportation (Non-Medical): Not on file  Physical Activity:   . Days of Exercise per Week: Not on file  . Minutes of Exercise per Session: Not on file  Stress:   . Feeling of Stress : Not on file  Social Connections:   . Frequency of Communication with Friends and Family: Not on file  . Frequency of Social Gatherings with Friends and Family: Not on file  . Attends Religious Services: Not on file  . Active Member of Clubs or Organizations: Not on file  . Attends Banker Meetings: Not on file  . Marital Status: Not on file   Additional Social History:     Sleep: Poor  Appetite:  Fair  Current Medications: Current Facility-Administered Medications  Medication Dose Route Frequency Provider Last Rate Last Admin  . acetaminophen (TYLENOL) tablet 650 mg  650 mg Oral Q6H PRN Clapacs, John T, MD      . albuterol (VENTOLIN HFA) 108 (90 Base) MCG/ACT inhaler 1-2 puff  1-2 puff Inhalation Q4H PRN Clapacs, John T, MD      . alum & mag hydroxide-simeth (MAALOX/MYLANTA) 200-200-20 MG/5ML suspension 30 mL  30 mL Oral Q4H PRN Clapacs, John T, MD      . atorvastatin (LIPITOR) tablet 10 mg  10 mg Oral Daily Clapacs, Jackquline Denmark, MD   10 mg at 10/22/20 0817  . bictegravir-emtricitabine-tenofovir AF (BIKTARVY) 50-200-25 MG per tablet 1 tablet  1 tablet Oral Daily Clapacs, Jackquline Denmark, MD   1 tablet at 10/22/20 0818  . ciprofloxacin (CIPRO) tablet 500 mg  500 mg Oral BID Jesse Sans, MD   500 mg at 10/22/20 0818  . citalopram (CELEXA) tablet 40 mg  40 mg Oral Daily Clapacs, Jackquline Denmark, MD   40 mg at 10/22/20 0817  . feeding supplement (ENSURE ENLIVE / ENSURE PLUS) liquid 237 mL  237 mL Oral TID BM Jesse Sans, MD   237 mL at 10/22/20 1015  . hydrOXYzine (ATARAX/VISTARIL) tablet 50 mg  50 mg Oral TID PRN Clapacs, Jackquline Denmark, MD   50 mg at 10/20/20  2111  . magnesium hydroxide (MILK OF MAGNESIA) suspension 30 mL  30 mL Oral Daily PRN Clapacs, John T, MD      . metroNIDAZOLE (FLAGYL) tablet 500 mg  500 mg Oral Q12H Jesse Sans, MD   500 mg at 10/22/20 0818  . mirtazapine (REMERON) tablet 7.5 mg  7.5 mg Oral QHS Jesse Sans, MD   7.5 mg at 10/21/20 2126  . ondansetron (ZOFRAN) tablet 8 mg  8 mg Oral Q8H PRN  Jesse Sans, MD   8 mg at 10/18/20 1659  . oxymetazoline (AFRIN) 0.05 % nasal spray 1 spray  1 spray Each Nare BID PRN Beverly Sessions, MD      . traMADol Janean Sark) tablet 50 mg  50 mg Oral Q6H PRN Clapacs, Jackquline Denmark, MD   50 mg at 10/21/20 2126  . traZODone (DESYREL) tablet 150 mg  150 mg Oral QHS PRN Jesse Sans, MD   150 mg at 10/21/20 2126    Lab Results:  No results found for this or any previous visit (from the past 48 hour(s)).  Blood Alcohol level:  Lab Results  Component Value Date   ETH <10 10/17/2020   ETH <10 05/15/2020    Metabolic Disorder Labs: Lab Results  Component Value Date   HGBA1C 5.2 05/27/2018   MPG 102.54 05/27/2018   MPG 114 10/26/2016   No results found for: PROLACTIN Lab Results  Component Value Date   CHOL 158 01/23/2019   TRIG 63 01/23/2019   HDL 43 01/23/2019   CHOLHDL 3.7 01/23/2019   VLDL 13 01/23/2019   LDLCALC 102 (H) 01/23/2019   LDLCALC 67 05/27/2018    Physical Findings: AIMS:  , ,  ,  ,    CIWA:    COWS:     Musculoskeletal: Strength & Muscle Tone: within normal limits Gait & Station: normal Patient leans: N/A  Psychiatric Specialty Exam: Physical Exam Vitals and nursing note reviewed.  Constitutional:      Appearance: Normal appearance.  HENT:     Head: Normocephalic and atraumatic.     Right Ear: External ear normal.     Left Ear: External ear normal.     Nose: Nose normal.     Mouth/Throat:     Mouth: Mucous membranes are moist.  Eyes:     Conjunctiva/sclera: Conjunctivae normal.  Cardiovascular:     Rate and Rhythm: Normal rate.   Pulmonary:     Effort: Pulmonary effort is normal.  Musculoskeletal:        General: No swelling. Normal range of motion.     Cervical back: Normal range of motion and neck supple.  Skin:    General: Skin is warm and dry.  Neurological:     General: No focal deficit present.     Mental Status: He is alert and oriented to person, place, and time.  Psychiatric:        Attention and Perception: Attention normal.        Mood and Affect: Mood is depressed.        Speech: Speech normal.        Behavior: Behavior is withdrawn.        Thought Content: Thought content includes suicidal ideation.        Cognition and Memory: Cognition and memory normal.        Judgment: Judgment normal.     Review of Systems  Constitutional: Positive for chills and fatigue.  HENT: Positive for congestion. Negative for sinus pressure, sinus pain and sore throat.   Eyes: Negative for photophobia and visual disturbance.  Respiratory: Negative for cough and shortness of breath.   Cardiovascular: Negative for chest pain and palpitations.  Gastrointestinal: Negative for constipation, diarrhea, nausea and vomiting.  Endocrine: Negative for cold intolerance and heat intolerance.  Genitourinary: Positive for dysuria, flank pain and frequency.  Musculoskeletal: Positive for arthralgias and myalgias.  Skin: Negative for rash and wound.  Allergic/Immunologic: Negative for immunocompromised state.  Neurological: Positive  for headaches. Negative for dizziness.  Hematological: Negative for adenopathy. Does not bruise/bleed easily.  Psychiatric/Behavioral: Positive for dysphoric mood, sleep disturbance and suicidal ideas. Negative for hallucinations.    Blood pressure (!) 146/82, pulse (!) 50, temperature 98 F (36.7 C), resp. rate 17, SpO2 99 %.There is no height or weight on file to calculate BMI.  General Appearance: Fairly Groomed  Eye Contact:  Good  Speech:  Clear and Coherent  Volume:  Normal  Mood:   Depressed and Dysphoric  Affect:  Congruent  Thought Process:  Coherent and Linear  Orientation:  Full (Time, Place, and Person)  Thought Content:  AH, depressed  Suicidal Thoughts:  Yes.  without intent/plan  Homicidal Thoughts:  No  Memory:  Immediate;   Fair Recent;   Fair Remote;   Fair  Judgement:  Intact  Insight:  Fair  Psychomotor Activity:  Normal  Concentration:  Concentration: Fair and Attention Span: Fair  Recall:  [employer] of Knowledge:  Fair  Language:  Fair  Akathisia:  Negative  Handed:  Right  AIMS (if indicated):     Assets:  Communication Skills Desire for Improvement Financial Resources/Insurance Resilience Social Support  ADL's:  Intact  Cognition:  WNL  Sleep:  Number of Hours: 8.25     Treatment Plan Summary: Daily contact with patient to assess and evaluate symptoms and progress in treatment, Medication management and Plan continue Remeron 7.5 mg nightly for insomnia, poor appetite, and mood.  Trazodone to 150 mg nightly PRN for sleep. Continue Celexa 40 mg daily for depression. Continue Cipro  for lower urinary tract infection.. Continue to monitor.  Group/milieu tx.   Beverly Sessions, MD 10/22/2020, 10:39 AMPatient ID: [name], male   DOB: [birth year], 54 y.o.   MRN: [id number]

## 2020-10-22 NOTE — Plan of Care (Signed)
Patient got out of bed for breakfast and  medications. Alert and oriented and denying thoughts of self harm. Pleasant on approach. Continues on current medication regimen. Encouragements provided and safety maintained as expected.

## 2020-10-23 DIAGNOSIS — F332 Major depressive disorder, recurrent severe without psychotic features: Secondary | ICD-10-CM | POA: Diagnosis not present

## 2020-10-23 MED ORDER — RISPERIDONE 1 MG PO TABS
1.0000 mg | ORAL_TABLET | Freq: Every day | ORAL | Status: DC
  Administered 2020-10-23 – 2020-10-27 (×5): 1 mg via ORAL
  Filled 2020-10-23 (×7): qty 1

## 2020-10-23 NOTE — Plan of Care (Signed)
  Problem: Education: Goal: Knowledge of [facility] Education information/materials will improve Outcome: Progressing Goal: Emotional status will improve Outcome: Progressing Goal: Mental status will improve Outcome: Progressing Goal: Verbalization of understanding the information provided will improve Outcome: Progressing   Problem: Activity: Goal: Interest or engagement in activities will improve Outcome: Progressing Goal: Sleeping patterns will improve Outcome: Progressing   Problem: Coping: Goal: Ability to verbalize frustrations and anger appropriately will improve Outcome: Progressing Goal: Ability to demonstrate self-control will improve Outcome: Progressing   Problem: Health Behavior/Discharge Planning: Goal: Identification of resources available to assist in meeting health care needs will improve Outcome: Progressing Goal: Compliance with treatment plan for underlying cause of condition will improve Outcome: Progressing   Problem: Physical Regulation: Goal: Ability to maintain clinical measurements within normal limits will improve Outcome: Progressing   Problem: Safety: Goal: Periods of time without injury will increase Outcome: Progressing   Problem: Education: Goal: Knowledge of disease or condition will improve Outcome: Progressing Goal: Understanding of discharge needs will improve Outcome: Progressing   Problem: Health Behavior/Discharge Planning: Goal: Ability to identify changes in lifestyle to reduce recurrence of condition will improve Outcome: Progressing Goal: Identification of resources available to assist in meeting health care needs will improve Outcome: Progressing   Problem: Physical Regulation: Goal: Complications related to the disease process, condition or treatment will be avoided or minimized Outcome: Progressing   Problem: Safety: Goal: Ability to remain free from injury will improve Outcome: Progressing   Problem:  Education: Goal: Ability to state activities that reduce stress will improve Outcome: Progressing   Problem: Coping: Goal: Ability to identify and develop effective coping behavior will improve Outcome: Progressing   Problem: Self-Concept: Goal: Ability to identify factors that promote anxiety will improve Outcome: Progressing Goal: Level of anxiety will decrease Outcome: Progressing Goal: Ability to modify response to factors that promote anxiety will improve Outcome: Progressing   Problem: Education: Goal: Utilization of techniques to improve thought processes will improve Outcome: Progressing Goal: Knowledge of the prescribed therapeutic regimen will improve Outcome: Progressing   Problem: Activity: Goal: Interest or engagement in leisure activities will improve Outcome: Progressing Goal: Imbalance in normal sleep/wake cycle will improve Outcome: Progressing   Problem: Coping: Goal: Coping ability will improve Outcome: Progressing Goal: Will verbalize feelings Outcome: Progressing   Problem: Health Behavior/Discharge Planning: Goal: Ability to make decisions will improve Outcome: Progressing Goal: Compliance with therapeutic regimen will improve Outcome: Progressing   Problem: Role Relationship: Goal: Will demonstrate positive changes in social behaviors and relationships Outcome: Progressing   Problem: Safety: Goal: Ability to disclose and discuss suicidal ideas will improve Outcome: Progressing Goal: Ability to identify and utilize support systems that promote safety will improve Outcome: Progressing   Problem: Self-Concept: Goal: Will verbalize positive feelings about self Outcome: Progressing Goal: Level of anxiety will decrease Outcome: Progressing

## 2020-10-23 NOTE — Progress Notes (Signed)
Patient alert and oriented x 4 affect is blunted, thoughts are organized and coherent no distress noted, he is interacting appropriately with peers and staff, he denies SI/HI/AVH , 15 minutes safety checks maintained will continue to monitor.  °

## 2020-10-23 NOTE — Progress Notes (Signed)
Pt is alert and oriented to person, place, time and situation. Pt is calm, cooperative, reports having chronic intermittent suicidal ideation, denies plan or intent, contracts for safety, reports he is able to control the suicidal thoughts, and distract from them by going to watch tv in the dayroom, reports he feels safe while in the hosptial and can come talk to staff if his thoughts worsen. Pt denies homicidal ideation, reports having fewer auditory hallucinations, intermittently, reports hearing voices, denies that they are saying anything that distresses the patient or negative things, pt reports he is able to recognize them as not real and part of his symptoms of mental illness, denies that they are of the command type, denies feelings of depression and anxiety. No distress noted, none reported. Will continue to monitor pt per Q15 minute face checks and monitor for safety and progress.

## 2020-10-23 NOTE — [facility] Group Notes (Signed)
[facility] Group Notes: (Clinical Social Work)   10/23/2020      Type of Therapy:  Group Therapy   Participation Level:  Did Not Attend - was invited individually by Nurse/MHT and chose not to attend.   Susa Simmonds, LCSWA 10/23/2020  2:54 PM

## 2020-10-23 NOTE — Progress Notes (Signed)
[facility] MD Progress Note  10/23/2020 1:03 PM Roy Joyce Gross Krejci Sr.  MRN:  [id number]   Subjective:   Joint hurts, I have arthritis,  I hear voices" Roy [name] seen in his room. Marland Kitchen He reports  hearing voices " calling my name", lunch  is ready" for last 2-3 days. Reports pain of multiple  joints due to arthritis and tylenol not helping, He states he is not currently suicidal, but had suicidal thoughts on and off, denies suicidal intent and plan. Marland Kitchen He is able to contract for safety at this time. He denies homicidal ideations, visual hallucinations.  Sleeping well, taking meds, denies any side effects. .   He has been medication compliant since admission, and has had no behavioral issues. He continues to desire long-term substance abuse treatment.   Principal Problem: Severe recurrent major depression without psychotic features (HCC) Diagnosis: Principal Problem:   Severe recurrent major depression without psychotic features (HCC) Active Problems:   HIV disease (HCC)   HTN (hypertension)   Cocaine dependence (HCC)   GERD (gastroesophageal reflux disease)   Lower urinary tract infection  Total Time spent with patient: 30 minutes  Past Psychiatric History: Patient has had several prior hospitalizations similar circumstances. Recurrent problems with drug abuse complicating his mood and making it difficult for him to maintain any stability.  Past Medical History:  Past Medical History:  Diagnosis Date  . AIDS (acquired immune deficiency syndrome) (HCC)   . Arthritis   . Asthma   . Bipolar disorder (HCC)   . Bronchitis   . Complication of anesthesia   . Coronary artery disease   . Depression   . Dysrhythmia    1st degree heart block/ brady  . GERD (gastroesophageal reflux disease)   . Hepatitis C    treated  . HIV (human immunodeficiency virus infection) (HCC)   . HTN (hypertension)   . PONV (postoperative nausea and vomiting)     Past Surgical History:  Procedure Laterality Date  .  APPLICATION OF WOUND VAC Right 09/01/2019   Procedure: APPLICATION OF WOUND VAC;  Surgeon: Kennedy Bucker, MD;  Location: [facility] ORS;  Service: Orthopedics;  Laterality: Right;  Serial # [id number]  . HERNIA REPAIR Left    inguinal  . TOE SURGERY    . TOE SURGERY Right   . TOTAL HIP ARTHROPLASTY Right 09/01/2019   Procedure: TOTAL HIP ARTHROPLASTY ANTERIOR APPROACH;  Surgeon: Kennedy Bucker, MD;  Location: [facility] ORS;  Service: Orthopedics;  Laterality: Right;   Family History:  Family History  Problem Relation Age of Onset  . Cancer Brother   . Uterine cancer Mother   . CAD Mother   . Hypertension Mother   . Hyperlipidemia Mother    Family Psychiatric  History: Denies Social History:  Social History   Substance and Sexual Activity  Alcohol Use Yes  . Alcohol/week: 84.0 standard drinks  . Types: 84 Cans of beer per week   Comment: daily     Social History   Substance and Sexual Activity  Drug Use Yes  . Types: Cocaine, "Crack" cocaine   Comment: states last use 07/10/2020    Social History   Socioeconomic History  . Marital status: Divorced    Spouse name: Not on file  . Number of children: Not on file  . Years of education: Not on file  . Highest education level: Not on file  Occupational History  . Not on file  Tobacco Use  . Smoking status: Current Every Day Smoker  Packs/day: 0.25    Types: Cigarettes  . Smokeless tobacco: Never Used  Vaping Use  . Vaping Use: Never used  Substance and Sexual Activity  . Alcohol use: Yes    Alcohol/week: 84.0 standard drinks    Types: 84 Cans of beer per week    Comment: daily  . Drug use: Yes    Types: Cocaine, "Crack" cocaine    Comment: states last use 07/10/2020  . Sexual activity: Yes  Other Topics Concern  . Not on file  Social History Narrative  . Not on file   Social Determinants of Health   Financial Resource Strain:   . Difficulty of Paying Living Expenses: Not on file  Food Insecurity:   . Worried About  Programme researcher, broadcasting/film/video in the Last Year: Not on file  . Ran Out of Food in the Last Year: Not on file  Transportation Needs:   . Lack of Transportation (Medical): Not on file  . Lack of Transportation (Non-Medical): Not on file  Physical Activity:   . Days of Exercise per Week: Not on file  . Minutes of Exercise per Session: Not on file  Stress:   . Feeling of Stress : Not on file  Social Connections:   . Frequency of Communication with Friends and Family: Not on file  . Frequency of Social Gatherings with Friends and Family: Not on file  . Attends Religious Services: Not on file  . Active Member of Clubs or Organizations: Not on file  . Attends Banker Meetings: Not on file  . Marital Status: Not on file   Additional Social History:     Sleep: Poor  Appetite:  Fair  Current Medications: Current Facility-Administered Medications  Medication Dose Route Frequency Provider Last Rate Last Admin  . acetaminophen (TYLENOL) tablet 650 mg  650 mg Oral Q6H PRN Clapacs, John T, MD      . albuterol (VENTOLIN HFA) 108 (90 Base) MCG/ACT inhaler 1-2 puff  1-2 puff Inhalation Q4H PRN Clapacs, John T, MD      . alum & mag hydroxide-simeth (MAALOX/MYLANTA) 200-200-20 MG/5ML suspension 30 mL  30 mL Oral Q4H PRN Clapacs, John T, MD      . atorvastatin (LIPITOR) tablet 10 mg  10 mg Oral Daily Clapacs, Jackquline Denmark, MD   10 mg at 10/23/20 0756  . bictegravir-emtricitabine-tenofovir AF (BIKTARVY) 50-200-25 MG per tablet 1 tablet  1 tablet Oral Daily Clapacs, Jackquline Denmark, MD   1 tablet at 10/23/20 0756  . ciprofloxacin (CIPRO) tablet 500 mg  500 mg Oral BID Jesse Sans, MD   500 mg at 10/23/20 0757  . citalopram (CELEXA) tablet 40 mg  40 mg Oral Daily Clapacs, Jackquline Denmark, MD   40 mg at 10/23/20 0756  . feeding supplement (ENSURE ENLIVE / ENSURE PLUS) liquid 237 mL  237 mL Oral TID BM Jesse Sans, MD   237 mL at 10/22/20 2100  . hydrOXYzine (ATARAX/VISTARIL) tablet 50 mg  50 mg Oral TID PRN Clapacs,  Jackquline Denmark, MD   50 mg at 10/22/20 2139  . magnesium hydroxide (MILK OF MAGNESIA) suspension 30 mL  30 mL Oral Daily PRN Clapacs, John T, MD      . metroNIDAZOLE (FLAGYL) tablet 500 mg  500 mg Oral Q12H Jesse Sans, MD   500 mg at 10/23/20 0757  . mirtazapine (REMERON) tablet 7.5 mg  7.5 mg Oral QHS Jesse Sans, MD   7.5 mg at 10/22/20 2100  .  ondansetron (ZOFRAN) tablet 8 mg  8 mg Oral Q8H PRN Jesse Sans, MD   8 mg at 10/18/20 1659  . oxymetazoline (AFRIN) 0.05 % nasal spray 1 spray  1 spray Each Nare BID PRN Beverly Sessions, MD      . risperiDONE (RISPERDAL) tablet 1 mg  1 mg Oral QHS Beverly Sessions, MD      . traMADol Janean Sark) tablet 50 mg  50 mg Oral Q6H PRN Clapacs, Jackquline Denmark, MD   50 mg at 10/21/20 2126  . traZODone (DESYREL) tablet 150 mg  150 mg Oral QHS PRN Jesse Sans, MD   150 mg at 10/22/20 2139    Lab Results:  No results found for this or any previous visit (from the past 48 hour(s)).  Blood Alcohol level:  Lab Results  Component Value Date   ETH <10 10/17/2020   ETH <10 05/15/2020    Metabolic Disorder Labs: Lab Results  Component Value Date   HGBA1C 5.2 05/27/2018   MPG 102.54 05/27/2018   MPG 114 10/26/2016   No results found for: PROLACTIN Lab Results  Component Value Date   CHOL 158 01/23/2019   TRIG 63 01/23/2019   HDL 43 01/23/2019   CHOLHDL 3.7 01/23/2019   VLDL 13 01/23/2019   LDLCALC 102 (H) 01/23/2019   LDLCALC 67 05/27/2018    Physical Findings: AIMS:  , ,  ,  ,    CIWA:    COWS:     Musculoskeletal: Strength & Muscle Tone: within normal limits Gait & Station: normal Patient leans: N/A  Psychiatric Specialty Exam: Physical Exam Vitals and nursing note reviewed.  Constitutional:      Appearance: Normal appearance.  HENT:     Head: Normocephalic and atraumatic.     Right Ear: External ear normal.     Left Ear: External ear normal.     Nose: Nose normal.     Mouth/Throat:     Mouth: Mucous membranes are moist.   Eyes:     Conjunctiva/sclera: Conjunctivae normal.  Cardiovascular:     Rate and Rhythm: Normal rate.  Pulmonary:     Effort: Pulmonary effort is normal.  Musculoskeletal:        General: No swelling. Normal range of motion.     Cervical back: Normal range of motion.  Skin:    General: Skin is warm and dry.  Neurological:     General: No focal deficit present.     Mental Status: He is alert and oriented to person, place, and time.  Psychiatric:        Attention and Perception: Attention normal.        Mood and Affect: Mood is depressed.        Speech: Speech normal.        Behavior: Behavior is withdrawn.        Thought Content: Thought content includes suicidal ideation.        Cognition and Memory: Cognition and memory normal.        Judgment: Judgment normal.     Review of Systems  Constitutional: Positive for chills and fatigue.  HENT: Positive for congestion. Negative for sinus pressure, sinus pain and sore throat.   Eyes: Negative for photophobia and visual disturbance.  Respiratory: Negative for cough and shortness of breath.   Cardiovascular: Negative for chest pain and palpitations.  Gastrointestinal: Negative for constipation, diarrhea, nausea and vomiting.  Endocrine: Negative for cold intolerance and heat intolerance.  Genitourinary: Positive for dysuria,  flank pain and frequency.  Musculoskeletal: Positive for arthralgias and myalgias.  Skin: Negative for rash and wound.  Allergic/Immunologic: Negative for immunocompromised state.  Neurological: Positive for headaches. Negative for dizziness.  Hematological: Negative for adenopathy. Does not bruise/bleed easily.  Psychiatric/Behavioral: Positive for dysphoric mood, sleep disturbance and suicidal ideas. Negative for hallucinations.    Blood pressure 131/82, pulse (!) 48, temperature 98.2 F (36.8 C), temperature source Oral, resp. rate 18, SpO2 100 %.There is no height or weight on file to calculate BMI.   General Appearance: Fairly Groomed  Eye Contact:  Good  Speech:  Clear and Coherent  Volume:  Normal  Mood:  Depressed and Dysphoric  Affect:  Congruent  Thought Process:  Coherent and Linear  Orientation:  Full (Time, Place, and Person)  Thought Content:  AH as above, depressed  Suicidal Thoughts:  Yes.  without intent/plan  Homicidal Thoughts:  No  Memory:  Immediate;   Fair Recent;   Fair Remote;   Fair  Judgement:  Intact  Insight:  Fair  Psychomotor Activity:  Normal  Concentration:  Concentration: Fair and Attention Span: Fair  Recall:  [employer] of Knowledge:  Fair  Language:  Fair  Akathisia:  Negative  Handed:  Right  AIMS (if indicated):     Assets:  Communication Skills Desire for Improvement Financial Resources/Insurance Resilience Social Support  ADL's:  Intact  Cognition:  WNL  Sleep:  Number of Hours: 8     Treatment Plan Summary: Daily contact with patient to assess and evaluate symptoms and progress in treatment, Medication management and  Plan  Add risperdal for AH continue Remeron 7.5 mg nightly for insomnia, poor appetite, and mood.   Trazodone to 150 mg nightly PRN for sleep.  Continue Celexa 40 mg daily for depression.  Continue Cipro  for lower urinary tract infection..  Tylenol and ultram for pain. Continue to monitor.  Group/milieu tx.   Beverly Sessions, MD 10/23/2020, 1:03 PMPatient ID: Roy Register Sr., male   DOB: [birth year], 54 y.o.   MRN: [id number] Patient ID: Roy [facility] Sr., male   DOB: 1966-01-05, 54 y.o.   MRN: [id number]

## 2020-10-24 MED ORDER — MENTHOL 3 MG MT LOZG
1.0000 | LOZENGE | OROMUCOSAL | Status: DC | PRN
  Administered 2020-10-24 – 2020-10-25 (×2): 3 mg via ORAL
  Filled 2020-10-24: qty 9

## 2020-10-24 NOTE — Plan of Care (Signed)
  Problem: Education: Goal: Knowledge of [facility] Education information/materials will improve Outcome: Progressing Goal: Emotional status will improve Outcome: Progressing Goal: Mental status will improve Outcome: Progressing Goal: Verbalization of understanding the information provided will improve Outcome: Progressing   Problem: Activity: Goal: Interest or engagement in activities will improve Outcome: Progressing Goal: Sleeping patterns will improve Outcome: Progressing   Problem: Coping: Goal: Ability to verbalize frustrations and anger appropriately will improve Outcome: Progressing Goal: Ability to demonstrate self-control will improve Outcome: Progressing   Problem: Health Behavior/Discharge Planning: Goal: Identification of resources available to assist in meeting health care needs will improve Outcome: Progressing Goal: Compliance with treatment plan for underlying cause of condition will improve Outcome: Progressing   Problem: Physical Regulation: Goal: Ability to maintain clinical measurements within normal limits will improve Outcome: Progressing   Problem: Safety: Goal: Periods of time without injury will increase Outcome: Progressing   Problem: Education: Goal: Knowledge of disease or condition will improve Outcome: Progressing Goal: Understanding of discharge needs will improve Outcome: Progressing   Problem: Health Behavior/Discharge Planning: Goal: Ability to identify changes in lifestyle to reduce recurrence of condition will improve Outcome: Progressing Goal: Identification of resources available to assist in meeting health care needs will improve Outcome: Progressing   Problem: Physical Regulation: Goal: Complications related to the disease process, condition or treatment will be avoided or minimized Outcome: Progressing   Problem: Safety: Goal: Ability to remain free from injury will improve Outcome: Progressing   Problem:  Education: Goal: Ability to state activities that reduce stress will improve Outcome: Progressing   Problem: Coping: Goal: Ability to identify and develop effective coping behavior will improve Outcome: Progressing   Problem: Self-Concept: Goal: Ability to identify factors that promote anxiety will improve Outcome: Progressing Goal: Level of anxiety will decrease Outcome: Progressing Goal: Ability to modify response to factors that promote anxiety will improve Outcome: Progressing   Problem: Education: Goal: Utilization of techniques to improve thought processes will improve Outcome: Progressing Goal: Knowledge of the prescribed therapeutic regimen will improve Outcome: Progressing   Problem: Activity: Goal: Interest or engagement in leisure activities will improve Outcome: Progressing Goal: Imbalance in normal sleep/wake cycle will improve Outcome: Progressing   Problem: Coping: Goal: Coping ability will improve Outcome: Progressing Goal: Will verbalize feelings Outcome: Progressing   Problem: Health Behavior/Discharge Planning: Goal: Ability to make decisions will improve Outcome: Progressing Goal: Compliance with therapeutic regimen will improve Outcome: Progressing   Problem: Role Relationship: Goal: Will demonstrate positive changes in social behaviors and relationships Outcome: Progressing   Problem: Safety: Goal: Ability to disclose and discuss suicidal ideas will improve Outcome: Progressing Goal: Ability to identify and utilize support systems that promote safety will improve Outcome: Progressing   Problem: Self-Concept: Goal: Will verbalize positive feelings about self Outcome: Progressing Goal: Level of anxiety will decrease Outcome: Progressing   

## 2020-10-24 NOTE — [facility] Counselor (Signed)
CSW called to check on status of referrals for the patient:  [facility] [id number] Pt has been approved.  Beds have been minimized to 24 beds so prioritizing detox beds at this time.  CSW spoke with Candy.  She was unable to provide any further details on length of waitlist, length of time this plan will be followed, etc.   [facility], [phone] CSW spoke with Drema Pry, Intake Coordinator, she reports that patient is on the waitlist.   Penni Homans, MSW, LCSW 10/24/2020 2:15 PM

## 2020-10-24 NOTE — Progress Notes (Signed)
D: Pt alert and oriented. Pt rates depression 10/10, hopelessness 10/10, and anxiety 10/10. Pt reports energy level as low and concentration as being poor. Pt reports sleep last night as being poor. Pt did receive medications for sleep and did not find them helpful. Pt reports experiencing 10/10 arthritic join pain that is generalized, prn meds given. Pt denies experiencing any HI, or VH at this time, however pt endorses AH stating he hears voices calling his name and has SI w/o plan or intent to act. Pt states he feels safe while he is here.   A: Scheduled medications administered to pt, per MD orders. Support and encouragement provided. Frequent verbal contact made. Routine safety checks conducted q15 minutes.   R: No adverse drug reactions noted. Pt verbally contracts for safety at this time. Pt complaint with medications. Pt interacts well with others on the unit. Pt remains safe at this time. Will continue to monitor.

## 2020-10-24 NOTE — Progress Notes (Signed)
[facility] MD Progress Note  10/24/2020 10:09 AM Roy Joyce Gross Snedeker Sr.  MRN:  [id number]   Subjective:   "Feeling depressed, and hurting all over" Roy [name] seen in his room. Marland Kitchen He reports  hearing voices " calling my name" for last 3-4 days. He denies that the voices are commanding him to do anything. He continues to report depression, and suicidal ideation. He has no plans or intents to hurt himself in the hospital, but remains concerned he will hurt himself via overdosing if he leaves the hospital. He denies any homicidal ideations or visual hallucinations. Reports pain of multiple  joints due to arthritis. Sleeping well, taking meds, denies any side effects. .   He has been medication compliant since admission, and has had no behavioral issues. He continues to desire long-term substance abuse treatment.   Principal Problem: Severe recurrent major depression without psychotic features (HCC) Diagnosis: Principal Problem:   Severe recurrent major depression without psychotic features (HCC) Active Problems:   HIV disease (HCC)   HTN (hypertension)   Cocaine dependence (HCC)   GERD (gastroesophageal reflux disease)   Lower urinary tract infection  Total Time spent with patient: 30 minutes  Past Psychiatric History: Patient has had several prior hospitalizations similar circumstances. Recurrent problems with drug abuse complicating his mood and making it difficult for him to maintain any stability.  Past Medical History:  Past Medical History:  Diagnosis Date  . AIDS (acquired immune deficiency syndrome) (HCC)   . Arthritis   . Asthma   . Bipolar disorder (HCC)   . Bronchitis   . Complication of anesthesia   . Coronary artery disease   . Depression   . Dysrhythmia    1st degree heart block/ brady  . GERD (gastroesophageal reflux disease)   . Hepatitis C    treated  . HIV (human immunodeficiency virus infection) (HCC)   . HTN (hypertension)   . PONV (postoperative nausea and  vomiting)     Past Surgical History:  Procedure Laterality Date  . APPLICATION OF WOUND VAC Right 09/01/2019   Procedure: APPLICATION OF WOUND VAC;  Surgeon: Kennedy Bucker, MD;  Location: [facility] ORS;  Service: Orthopedics;  Laterality: Right;  Serial # [id number]  . HERNIA REPAIR Left    inguinal  . TOE SURGERY    . TOE SURGERY Right   . TOTAL HIP ARTHROPLASTY Right 09/01/2019   Procedure: TOTAL HIP ARTHROPLASTY ANTERIOR APPROACH;  Surgeon: Kennedy Bucker, MD;  Location: [facility] ORS;  Service: Orthopedics;  Laterality: Right;   Family History:  Family History  Problem Relation Age of Onset  . Cancer Brother   . Uterine cancer Mother   . CAD Mother   . Hypertension Mother   . Hyperlipidemia Mother    Family Psychiatric  History: Denies Social History:  Social History   Substance and Sexual Activity  Alcohol Use Yes  . Alcohol/week: 84.0 standard drinks  . Types: 84 Cans of beer per week   Comment: daily     Social History   Substance and Sexual Activity  Drug Use Yes  . Types: Cocaine, "Crack" cocaine   Comment: states last use 07/10/2020    Social History   Socioeconomic History  . Marital status: Divorced    Spouse name: Not on file  . Number of children: Not on file  . Years of education: Not on file  . Highest education level: Not on file  Occupational History  . Not on file  Tobacco Use  . Smoking status:  Current Every Day Smoker    Packs/day: 0.25    Types: Cigarettes  . Smokeless tobacco: Never Used  Vaping Use  . Vaping Use: Never used  Substance and Sexual Activity  . Alcohol use: Yes    Alcohol/week: 84.0 standard drinks    Types: 84 Cans of beer per week    Comment: daily  . Drug use: Yes    Types: Cocaine, "Crack" cocaine    Comment: states last use 07/10/2020  . Sexual activity: Yes  Other Topics Concern  . Not on file  Social History Narrative  . Not on file   Social Determinants of Health   Financial Resource Strain:   . Difficulty of Paying  Living Expenses: Not on file  Food Insecurity:   . Worried About Programme researcher, broadcasting/film/video in the Last Year: Not on file  . Ran Out of Food in the Last Year: Not on file  Transportation Needs:   . Lack of Transportation (Medical): Not on file  . Lack of Transportation (Non-Medical): Not on file  Physical Activity:   . Days of Exercise per Week: Not on file  . Minutes of Exercise per Session: Not on file  Stress:   . Feeling of Stress : Not on file  Social Connections:   . Frequency of Communication with Friends and Family: Not on file  . Frequency of Social Gatherings with Friends and Family: Not on file  . Attends Religious Services: Not on file  . Active Member of Clubs or Organizations: Not on file  . Attends Banker Meetings: Not on file  . Marital Status: Not on file   Additional Social History:     Sleep: Poor  Appetite:  Fair  Current Medications: Current Facility-Administered Medications  Medication Dose Route Frequency Provider Last Rate Last Admin  . acetaminophen (TYLENOL) tablet 650 mg  650 mg Oral Q6H PRN Clapacs, Jackquline Denmark, MD   650 mg at 10/24/20 0757  . albuterol (VENTOLIN HFA) 108 (90 Base) MCG/ACT inhaler 1-2 puff  1-2 puff Inhalation Q4H PRN Clapacs, Jackquline Denmark, MD   2 puff at 10/24/20 0758  . alum & mag hydroxide-simeth (MAALOX/MYLANTA) 200-200-20 MG/5ML suspension 30 mL  30 mL Oral Q4H PRN Clapacs, John T, MD      . atorvastatin (LIPITOR) tablet 10 mg  10 mg Oral Daily Clapacs, Jackquline Denmark, MD   10 mg at 10/24/20 0757  . bictegravir-emtricitabine-tenofovir AF (BIKTARVY) 50-200-25 MG per tablet 1 tablet  1 tablet Oral Daily Clapacs, Jackquline Denmark, MD   1 tablet at 10/24/20 0757  . ciprofloxacin (CIPRO) tablet 500 mg  500 mg Oral BID Jesse Sans, MD   500 mg at 10/24/20 0758  . citalopram (CELEXA) tablet 40 mg  40 mg Oral Daily Clapacs, Jackquline Denmark, MD   40 mg at 10/24/20 0757  . feeding supplement (ENSURE ENLIVE / ENSURE PLUS) liquid 237 mL  237 mL Oral TID BM Jesse Sans, MD   237 mL at 10/24/20 0936  . hydrOXYzine (ATARAX/VISTARIL) tablet 50 mg  50 mg Oral TID PRN Clapacs, Jackquline Denmark, MD   50 mg at 10/24/20 0757  . magnesium hydroxide (MILK OF MAGNESIA) suspension 30 mL  30 mL Oral Daily PRN Clapacs, John T, MD      . metroNIDAZOLE (FLAGYL) tablet 500 mg  500 mg Oral Q12H Jesse Sans, MD   500 mg at 10/24/20 0758  . mirtazapine (REMERON) tablet 7.5 mg  7.5 mg Oral  QHS Jesse Sans, MD   7.5 mg at 10/23/20 2110  . ondansetron (ZOFRAN) tablet 8 mg  8 mg Oral Q8H PRN Jesse Sans, MD   8 mg at 10/18/20 1659  . oxymetazoline (AFRIN) 0.05 % nasal spray 1 spray  1 spray Each Nare BID PRN Beverly Sessions, MD   1 spray at 10/24/20 0758  . risperiDONE (RISPERDAL) tablet 1 mg  1 mg Oral QHS Beverly Sessions, MD   1 mg at 10/23/20 2110  . traMADol (ULTRAM) tablet 50 mg  50 mg Oral Q6H PRN Clapacs, Jackquline Denmark, MD   50 mg at 10/21/20 2126  . traZODone (DESYREL) tablet 150 mg  150 mg Oral QHS PRN Jesse Sans, MD   150 mg at 10/23/20 2110    Lab Results:  No results found for this or any previous visit (from the past 48 hour(s)).  Blood Alcohol level:  Lab Results  Component Value Date   ETH <10 10/17/2020   ETH <10 05/15/2020    Metabolic Disorder Labs: Lab Results  Component Value Date   HGBA1C 5.2 05/27/2018   MPG 102.54 05/27/2018   MPG 114 10/26/2016   No results found for: PROLACTIN Lab Results  Component Value Date   CHOL 158 01/23/2019   TRIG 63 01/23/2019   HDL 43 01/23/2019   CHOLHDL 3.7 01/23/2019   VLDL 13 01/23/2019   LDLCALC 102 (H) 01/23/2019   LDLCALC 67 05/27/2018    Physical Findings: AIMS:  , ,  ,  ,    CIWA:    COWS:     Musculoskeletal: Strength & Muscle Tone: within normal limits Gait & Station: normal Patient leans: N/A  Psychiatric Specialty Exam: Physical Exam Vitals and nursing note reviewed.  Constitutional:      Appearance: Normal appearance.  HENT:     Head: Normocephalic and atraumatic.      Right Ear: External ear normal.     Left Ear: External ear normal.     Nose: Nose normal.     Mouth/Throat:     Mouth: Mucous membranes are moist.  Eyes:     Conjunctiva/sclera: Conjunctivae normal.  Cardiovascular:     Rate and Rhythm: Normal rate.  Pulmonary:     Effort: Pulmonary effort is normal.  Musculoskeletal:        General: No swelling. Normal range of motion.     Cervical back: Normal range of motion.  Skin:    General: Skin is warm and dry.  Neurological:     General: No focal deficit present.     Mental Status: He is alert and oriented to person, place, and time.  Psychiatric:        Attention and Perception: Attention normal.        Mood and Affect: Mood is depressed.        Speech: Speech normal.        Behavior: Behavior is not withdrawn. Behavior is cooperative.        Thought Content: Thought content includes suicidal ideation.        Cognition and Memory: Cognition and memory normal.        Judgment: Judgment normal.     Review of Systems  Constitutional: Positive for fatigue. Negative for chills.  HENT: Negative for congestion, sinus pressure, sinus pain and sore throat.   Eyes: Negative for photophobia and visual disturbance.  Respiratory: Negative for cough and shortness of breath.   Cardiovascular: Negative for chest pain and palpitations.  Gastrointestinal: Negative for constipation, diarrhea, nausea and vomiting.  Endocrine: Negative for cold intolerance and heat intolerance.  Genitourinary: Positive for frequency. Negative for dysuria and flank pain.  Musculoskeletal: Positive for arthralgias and myalgias.  Skin: Negative for rash and wound.  Allergic/Immunologic: Negative for immunocompromised state.  Neurological: Positive for headaches. Negative for dizziness.  Hematological: Negative for adenopathy. Does not bruise/bleed easily.  Psychiatric/Behavioral: Positive for dysphoric mood and suicidal ideas. Negative for hallucinations and sleep  disturbance.    Blood pressure (!) 139/113, pulse 68, temperature 98.7 F (37.1 C), temperature source Oral, resp. rate 14, SpO2 100 %.There is no height or weight on file to calculate BMI.  General Appearance: Fairly Groomed  Eye Contact:  Good  Speech:  Clear and Coherent  Volume:  Normal  Mood:  Depressed and Dysphoric  Affect:  Congruent  Thought Process:  Coherent and Linear  Orientation:  Full (Time, Place, and Person)  Thought Content:  AH as above, depressed  Suicidal Thoughts:  Yes.  without intent/plan  Homicidal Thoughts:  No  Memory:  Immediate;   Fair Recent;   Fair Remote;   Fair  Judgement:  Intact  Insight:  Fair  Psychomotor Activity:  Normal  Concentration:  Concentration: Fair and Attention Span: Fair  Recall:  [employer] of Knowledge:  Fair  Language:  Fair  Akathisia:  Negative  Handed:  Right  AIMS (if indicated):     Assets:  Communication Skills Desire for Improvement Financial Resources/Insurance Resilience Social Support  ADL's:  Intact  Cognition:  WNL  Sleep:  Number of Hours: 8.5     Treatment Plan Summary: Daily contact with patient to assess and evaluate symptoms and progress in treatment, Medication management Plan  Continue risperdal 1 mg nightly for AH Continue Remeron 7.5 mg nightly for insomnia, poor appetite, and mood.  Trazodone to 150 mg nightly PRN for sleep.  Continue Celexa 40 mg daily for depression. Continue Cipro and Flagyl  for lower urinary tract infection (day 6 of 7) Tylenol and ultram for pain. Continue to monitor.  Group/milieu tx.   Jesse Sans, MD 10/24/2020, 10:09 AMPatient ID: Roy Register Sr., male   DOB: 1966-08-20, 54 y.o.   MRN: [id number] Patient ID: Roy Holberg [facility] Sr., male   DOB: 29-Jun-1966, 54 y.o.   MRN: [id number]

## 2020-10-24 NOTE — Progress Notes (Signed)
Patient is alert and oriented x 4. H is pleasant and cooperative at this encounter. He denies SI  HI AVH (at this moment)  depression and anxiety. He is med compliant and tolerated his medication without incident. He is active on the unit and gets along well with other peers. He is safe with 15 minute safety checks and was encouraged to contact staff with any concerns.    Cleo Butler-Nicholson, LPN

## 2020-10-24 NOTE — Tx Team (Signed)
Interdisciplinary Treatment and Diagnostic Plan Update  10/24/2020 Time of Session: 9:00AM Roy Kay Hayhurst Sr. MRN: [id number]  Principal Diagnosis: Severe recurrent major depression without psychotic features (HCC)  Secondary Diagnoses: Principal Problem:   Severe recurrent major depression without psychotic features (HCC) Active Problems:   HIV disease (HCC)   HTN (hypertension)   Cocaine dependence (HCC)   GERD (gastroesophageal reflux disease)   Lower urinary tract infection   Current Medications:  Current Facility-Administered Medications  Medication Dose Route Frequency Provider Last Rate Last Admin   acetaminophen (TYLENOL) tablet 650 mg  650 mg Oral Q6H PRN Clapacs, John T, MD   650 mg at 10/24/20 0757   albuterol (VENTOLIN HFA) 108 (90 Base) MCG/ACT inhaler 1-2 puff  1-2 puff Inhalation Q4H PRN Clapacs, John T, MD   2 puff at 10/24/20 0758   alum & mag hydroxide-simeth (MAALOX/MYLANTA) 200-200-20 MG/5ML suspension 30 mL  30 mL Oral Q4H PRN Clapacs, John T, MD       atorvastatin (LIPITOR) tablet 10 mg  10 mg Oral Daily Clapacs, John T, MD   10 mg at 10/24/20 0757   bictegravir-emtricitabine-tenofovir AF (BIKTARVY) 50-200-25 MG per tablet 1 tablet  1 tablet Oral Daily Clapacs, Jackquline Denmark, MD   1 tablet at 10/24/20 0757   ciprofloxacin (CIPRO) tablet 500 mg  500 mg Oral BID Jesse Sans, MD   500 mg at 10/24/20 0758   citalopram (CELEXA) tablet 40 mg  40 mg Oral Daily Clapacs, Jackquline Denmark, MD   40 mg at 10/24/20 0757   feeding supplement (ENSURE ENLIVE / ENSURE PLUS) liquid 237 mL  237 mL Oral TID BM Jesse Sans, MD   237 mL at 10/22/20 2100   hydrOXYzine (ATARAX/VISTARIL) tablet 50 mg  50 mg Oral TID PRN Clapacs, Jackquline Denmark, MD   50 mg at 10/24/20 0757   magnesium hydroxide (MILK OF MAGNESIA) suspension 30 mL  30 mL Oral Daily PRN Clapacs, John T, MD       metroNIDAZOLE (FLAGYL) tablet 500 mg  500 mg Oral Q12H Jesse Sans, MD   500 mg at 10/24/20 0758    mirtazapine (REMERON) tablet 7.5 mg  7.5 mg Oral QHS Jesse Sans, MD   7.5 mg at 10/23/20 2110   ondansetron (ZOFRAN) tablet 8 mg  8 mg Oral Q8H PRN Jesse Sans, MD   8 mg at 10/18/20 1659   oxymetazoline (AFRIN) 0.05 % nasal spray 1 spray  1 spray Each Nare BID PRN Beverly Sessions, MD   1 spray at 10/24/20 0758   risperiDONE (RISPERDAL) tablet 1 mg  1 mg Oral QHS Beverly Sessions, MD   1 mg at 10/23/20 2110   traMADol (ULTRAM) tablet 50 mg  50 mg Oral Q6H PRN Clapacs, John T, MD   50 mg at 10/21/20 2126   traZODone (DESYREL) tablet 150 mg  150 mg Oral QHS PRN Jesse Sans, MD   150 mg at 10/23/20 2110   PTA Medications: Medications Prior to Admission  Medication Sig Dispense Refill Last Dose   albuterol (VENTOLIN HFA) 108 (90 Base) MCG/ACT inhaler Inhale 1-2 puffs into the lungs every 4 (four) hours as needed for wheezing or shortness of breath. 18 g 1    atorvastatin (LIPITOR) 10 MG tablet Take 1 tablet (10 mg total) by mouth daily at 6 PM. (Patient not taking: Reported on 07/01/2020) 30 tablet 1    bictegravir-emtricitabine-tenofovir AF (BIKTARVY) 50-200-25 MG TABS tablet Take 1 tablet by mouth  daily. (Patient not taking: Reported on 07/01/2020) 30 tablet 1    citalopram (CELEXA) 40 MG tablet Take 1 tablet (40 mg total) by mouth daily. (Patient not taking: Reported on 07/01/2020) 30 tablet 1    gabapentin (NEURONTIN) 300 MG capsule Take 1 capsule (300 mg total) by mouth 2 (two) times daily. (Patient not taking: Reported on 07/01/2020) 60 capsule 1    hydrOXYzine (ATARAX/VISTARIL) 50 MG tablet Take 1 tablet (50 mg total) by mouth 3 (three) times daily as needed. (Patient not taking: Reported on 07/01/2020) 30 tablet 0    oxyCODONE (OXY IR/ROXICODONE) 5 MG immediate release tablet Take by mouth. (Patient not taking: Reported on 06/21/2020)      traMADol (ULTRAM) 50 MG tablet Take 1 tablet (50 mg total) by mouth every 6 (six) hours as needed. (Patient not taking: Reported on  07/01/2020) 30 tablet 0    traZODone (DESYREL) 300 MG tablet Take 1 tablet (300 mg total) by mouth at bedtime as needed for sleep. (Patient not taking: Reported on 07/01/2020) 30 tablet 1     Patient Stressors: Health problems Substance abuse  Patient Strengths: Ability for insight Licensed conveyancer Motivation for treatment/growth  Treatment Modalities: Medication Management, Group therapy, Case management,  1 to 1 session with clinician, Psychoeducation, Recreational therapy.   Physician Treatment Plan for Primary Diagnosis: Severe recurrent major depression without psychotic features (HCC) Long Term Goal(s): Improvement in symptoms so as ready for discharge Improvement in symptoms so as ready for discharge   Short Term Goals: Ability to identify changes in lifestyle to reduce recurrence of condition will improve Ability to verbalize feelings will improve Ability to disclose and discuss suicidal ideas Ability to demonstrate self-control will improve Ability to identify and develop effective coping behaviors will improve Compliance with prescribed medications will improve Ability to identify triggers associated with substance abuse/mental health issues will improve Ability to identify changes in lifestyle to reduce recurrence of condition will improve Ability to verbalize feelings will improve Ability to disclose and discuss suicidal ideas Ability to demonstrate self-control will improve Ability to identify and develop effective coping behaviors will improve Compliance with prescribed medications will improve Ability to identify triggers associated with substance abuse/mental health issues will improve  Medication Management: Evaluate patient's response, side effects, and tolerance of medication regimen.  Therapeutic Interventions: 1 to 1 sessions, Unit Group sessions and Medication administration.  Evaluation of Outcomes: Progressing  Physician Treatment  Plan for Secondary Diagnosis: Principal Problem:   Severe recurrent major depression without psychotic features (HCC) Active Problems:   HIV disease (HCC)   HTN (hypertension)   Cocaine dependence (HCC)   GERD (gastroesophageal reflux disease)   Lower urinary tract infection  Long Term Goal(s): Improvement in symptoms so as ready for discharge Improvement in symptoms so as ready for discharge   Short Term Goals: Ability to identify changes in lifestyle to reduce recurrence of condition will improve Ability to verbalize feelings will improve Ability to disclose and discuss suicidal ideas Ability to demonstrate self-control will improve Ability to identify and develop effective coping behaviors will improve Compliance with prescribed medications will improve Ability to identify triggers associated with substance abuse/mental health issues will improve Ability to identify changes in lifestyle to reduce recurrence of condition will improve Ability to verbalize feelings will improve Ability to disclose and discuss suicidal ideas Ability to demonstrate self-control will improve Ability to identify and develop effective coping behaviors will improve Compliance with prescribed medications will improve Ability to identify triggers associated  with substance abuse/mental health issues will improve     Medication Management: Evaluate patient's response, side effects, and tolerance of medication regimen.  Therapeutic Interventions: 1 to 1 sessions, Unit Group sessions and Medication administration.  Evaluation of Outcomes: Progressing   RN Treatment Plan for Primary Diagnosis: Severe recurrent major depression without psychotic features (HCC) Long Term Goal(s): Knowledge of disease and therapeutic regimen to maintain health will improve  Short Term Goals: Ability to demonstrate self-control, Ability to participate in decision making will improve, Ability to verbalize feelings will improve,  Ability to identify and develop effective coping behaviors will improve and Compliance with prescribed medications will improve  Medication Management: RN will administer medications as ordered by provider, will assess and evaluate patient's response and provide education to patient for prescribed medication. RN will report any adverse and/or side effects to prescribing provider.  Therapeutic Interventions: 1 on 1 counseling sessions, Psychoeducation, Medication administration, Evaluate responses to treatment, Monitor vital signs and CBGs as ordered, Perform/monitor CIWA, COWS, AIMS and Fall Risk screenings as ordered, Perform wound care treatments as ordered.  Evaluation of Outcomes: Progressing   LCSW Treatment Plan for Primary Diagnosis: Severe recurrent major depression without psychotic features (HCC) Long Term Goal(s): Safe transition to appropriate next level of care at discharge, Engage patient in therapeutic group addressing interpersonal concerns.  Short Term Goals: Engage patient in aftercare planning with referrals and resources, Increase social support, Increase ability to appropriately verbalize feelings, Increase emotional regulation, Facilitate acceptance of mental health diagnosis and concerns, Facilitate patient progression through stages of change regarding substance use diagnoses and concerns, Identify triggers associated with mental health/substance abuse issues and Increase skills for wellness and recovery  Therapeutic Interventions: Assess for all discharge needs, 1 to 1 time with Social worker, Explore available resources and support systems, Assess for adequacy in community support network, Educate family and significant other(s) on suicide prevention, Complete Psychosocial Assessment, Interpersonal group therapy.  Evaluation of Outcomes: Progressing   Progress in Treatment: Attending groups: Yes. Participating in groups: Yes. Taking medication as prescribed:  Yes. Toleration medication: Yes. Family/Significant other contact made: Yes, individual(s) contacted:  SPE completed with patient and collateral. Patient understands diagnosis: Yes. Discussing patient identified problems/goals with staff: Yes. Medical problems stabilized or resolved: Yes. Denies suicidal/homicidal ideation: Yes. Issues/concerns per patient self-inventory: No. Other: None.  New problem(s) identified: No, Describe:  none.  New Short Term/Long Term Goal(s): detox, medication management for mood stabilization; elimination of SI thoughts; development of comprehensive mental wellness/sobriety plan.  Update 10/24/2020: No updates at this time.  Patient Goals:  "Trying to get into a safe environment." Update 10/24/2020: No updates at this time.  Discharge Plan or Barriers: Pt expresses desire for placement, specifically 30 day treatment facility. Update 10/24/2020: CSW has assisted with completing referrals for the patient for inpatient treatment to address his MH and SA uses.    Reason for Continuation of Hospitalization: Depression Medical Issues Medication stabilization  Estimated Length of Stay: 1-7 days  Attendees: Patient:  10/24/2020 9:12 AM  Physician: Les Pou, MD 10/24/2020 9:12 AM  Nursing: 10/24/2020 9:12 AM  RN Care Manager: 10/24/2020 9:12 AM  Social Worker: Penni Homans, MSW, LCSW 10/24/2020 9:12 AM  Recreational Therapist:  10/24/2020 9:12 AM  Other: Vilma Meckel. Algis Greenhouse, MSW, LCSW, LCAS 10/24/2020 9:12 AM  Other:  10/24/2020 9:12 AM  Other: 10/24/2020 9:12 AM    Scribe for Treatment Team: [name], LCSW 10/24/2020 9:12 AM

## 2020-10-24 NOTE — [facility] Group Notes (Addendum)
LCSW Group Therapy Note   10/24/2020 2:12 PM  Type of Therapy and Topic:  Group Therapy:  Overcoming Obstacles   Participation Level:  Active   Description of Group:    In this group patients will be encouraged to explore what they see as obstacles to their own wellness and recovery. They will be guided to discuss their thoughts, feelings, and behaviors related to these obstacles. The group will process together ways to cope with barriers, with attention given to specific choices patients can make. Each patient will be challenged to identify changes they are motivated to make in order to overcome their obstacles. This group will be process-oriented, with patients participating in exploration of their own experiences as well as giving and receiving support and challenge from other group members.   Therapeutic Goals: 1. Patient will identify personal and current obstacles as they relate to admission. 2. Patient will identify barriers that currently interfere with their wellness or overcoming obstacles.  3. Patient will identify feelings, thought process and behaviors related to these barriers. 4. Patient will identify two changes they are willing to make to overcome these obstacles:      Summary of Patient Progress Pt participated in the group discussion. Drug use was identified as his personal/current obstacle. He endorsed environment and reconnecting with people who used to use with as barriers for recovery. He spoke about feelings of guilt/shame and negative self talk which help further this barriers. Pt spoke about asking for help and seeking placement as changes that he wants to make.     Therapeutic Modalities:   Cognitive Behavioral Therapy Solution Focused Therapy Motivational Interviewing Relapse Prevention Therapy  Simona Huh R. Algis Greenhouse, MSW, LCSW, LCAS 10/24/2020 2:12 PM

## 2020-10-25 LAB — URINALYSIS, ROUTINE W REFLEX MICROSCOPIC
Bacteria, UA: NONE SEEN
Bilirubin Urine: NEGATIVE
Glucose, UA: NEGATIVE mg/dL
Hgb urine dipstick: NEGATIVE
Ketones, ur: NEGATIVE mg/dL
Nitrite: NEGATIVE
Protein, ur: NEGATIVE mg/dL
Specific Gravity, Urine: 1.012 (ref 1.005–1.030)
pH: 7 (ref 5.0–8.0)

## 2020-10-25 MED ORDER — OXYMETAZOLINE HCL 0.05 % NA SOLN
1.0000 | Freq: Two times a day (BID) | NASAL | Status: DC
  Administered 2020-10-25 – 2020-10-28 (×6): 1 via NASAL
  Filled 2020-10-25: qty 15

## 2020-10-25 MED ORDER — SALINE SPRAY 0.65 % NA SOLN
1.0000 | NASAL | Status: DC | PRN
  Filled 2020-10-25: qty 44

## 2020-10-25 NOTE — B[facility] Counselor (Signed)
CSW called Reggie at [facility], [place]) to follow up regarding application that had been requested for completion with the pt. Contact unable to be established. HIPPA complaint voicemail left for follow up. CSW will continue to follow.   Vilma Meckel. Algis Greenhouse, MSW, LCSW, LCAS 10/25/2020 3:53 PM

## 2020-10-25 NOTE — Progress Notes (Signed)
Recreation Therapy Notes   Date: 09/24/2020  Time: 9:30 am   Location: Craft room   Behavioral response: Appropriate  Intervention Topic: Relaxation    Discussion/Intervention:  Group content today was focused on relaxation. The group defined relaxation and identified healthy ways to relax. Individuals expressed how much time they spend relaxing. Patients expressed how much their life would be if they did not make time for themselves to relax. The group stated ways they could improve their relaxation techniques in the future.  Individuals participated in the intervention "Time to Relax" where they had a chance to experience different relaxation techniques. Clinical Observations/Feedback: Patient came to group and relaxed while socializing with peers and staff. He explained that drawing is relaxing for him.  Seriyah Collison LRT/CTRS         Devann Cribb 10/25/2020 11:21 AM

## 2020-10-25 NOTE — [facility] Group Notes (Signed)
LCSW Group Therapy Note  10/25/2020 3:25 PM  Type of Therapy/Topic:  Group Therapy:  Feelings about Diagnosis  Participation Level:  Did Not Attend   Description of Group:   This group will allow patients to explore their thoughts and feelings about diagnoses they have received. Patients will be guided to explore their level of understanding and acceptance of these diagnoses. Facilitator will encourage patients to process their thoughts and feelings about the reactions of others to their diagnosis and will guide patients in identifying ways to discuss their diagnosis with significant others in their lives. This group will be process-oriented, with patients participating in exploration of their own experiences, giving and receiving support, and processing challenge from other group members.   Therapeutic Goals: 1. Patient will demonstrate understanding of diagnosis as evidenced by identifying two or more symptoms of the disorder 2. Patient will be able to express two feelings regarding the diagnosis 3. Patient will demonstrate their ability to communicate their needs through discussion and/or role play  Summary of Patient Progress: X     Therapeutic Modalities:   Cognitive Behavioral Therapy Brief Therapy Feelings Identification   Penni Homans, MSW, LCSW 10/25/2020 3:25 PM

## 2020-10-25 NOTE — Progress Notes (Signed)
Patient is in a pleasant mood. He is active on the unit, engaging well with other peers.  He denies SI  HI  AVH. He does endorse having pain, but did not request any medication. He also endorses  having anxiety and depression which he received medication to help with symptoms. He remains safe on the unit with 15 minute safety rounds and was encouraged to contact staff with any concerns.       Cleo Butler-Nicholson, LPN

## 2020-10-25 NOTE — B[facility] Counselor (Signed)
CSW called to check on status of referrals for the patient:  [facility] [phone] Pt has been approved.  Beds have been minimized to 24 beds so prioritizing detox beds at this time.  CSW spoke with Candy.  She was unable to provide any further details on length of waitlist, length of time this plan will be followed, etc.   [facility], [phone] CSW left a HIPPA complaint voicemail with contact information for follow up.  Roy [name]. Algis Greenhouse, MSW, LCSW, LCAS 10/25/2020 3:48 PM

## 2020-10-25 NOTE — Progress Notes (Signed)
[facility] MD Progress Note  10/25/2020 12:23 PM Roy Joyce Gross Otani Sr.  MRN:  [id number]   Subjective:   "Feeling tired" Roy [name] seen in his room. He reports difficulty sleeping overnight, and multiple nighttime awakenings. Sometimes waking up to urinate, and other times because he felt he heard nursing staff calling his name. He continues to report depression, and suicidal ideation. He has no plans or intents to hurt himself in the hospital, but remains concerned he will hurt himself via overdosing if he leaves the hospital. He denies any homicidal ideations or visual hallucinations. Reports pain of multiple  joints due to arthritis. He has been medication compliant, and denies any side effects  He has been participating in groups, and has had no behavioral issues. He continues to desire long-term substance abuse treatment. He continues to report burning with urination after 7-day course of antibiotics. Will repeat urinalysis and urine culture.   Principal Problem: Severe recurrent major depression without psychotic features (HCC) Diagnosis: Principal Problem:   Severe recurrent major depression without psychotic features (HCC) Active Problems:   HIV disease (HCC)   HTN (hypertension)   Cocaine dependence (HCC)   GERD (gastroesophageal reflux disease)   Lower urinary tract infection  Total Time spent with Roy: 30 minutes  Past Psychiatric History: Roy has had several prior hospitalizations similar circumstances. Recurrent problems with drug abuse complicating his mood and making it difficult for him to maintain any stability.  Past Medical History:  Past Medical History:  Diagnosis Date  . AIDS (acquired immune deficiency syndrome) (HCC)   . Arthritis   . Asthma   . Bipolar disorder (HCC)   . Bronchitis   . Complication of anesthesia   . Coronary artery disease   . Depression   . Dysrhythmia    1st degree heart block/ brady  . GERD (gastroesophageal reflux disease)   .  Hepatitis C    treated  . HIV (human immunodeficiency virus infection) (HCC)   . HTN (hypertension)   . PONV (postoperative nausea and vomiting)     Past Surgical History:  Procedure Laterality Date  . APPLICATION OF WOUND VAC Right 09/01/2019   Procedure: APPLICATION OF WOUND VAC;  Surgeon: Kennedy Bucker, MD;  Location: [facility] ORS;  Service: Orthopedics;  Laterality: Right;  Serial # [id number]  . HERNIA REPAIR Left    inguinal  . TOE SURGERY    . TOE SURGERY Right   . TOTAL HIP ARTHROPLASTY Right 09/01/2019   Procedure: TOTAL HIP ARTHROPLASTY ANTERIOR APPROACH;  Surgeon: Kennedy Bucker, MD;  Location: [facility] ORS;  Service: Orthopedics;  Laterality: Right;   Family History:  Family History  Problem Relation Age of Onset  . Cancer Brother   . Uterine cancer Mother   . CAD Mother   . Hypertension Mother   . Hyperlipidemia Mother    Family Psychiatric  History: Denies Social History:  Social History   Substance and Sexual Activity  Alcohol Use Yes  . Alcohol/week: 84.0 standard drinks  . Types: 84 Cans of beer per week   Comment: daily     Social History   Substance and Sexual Activity  Drug Use Yes  . Types: Cocaine, "Crack" cocaine   Comment: states last use 07/10/2020    Social History   Socioeconomic History  . Marital status: Divorced    Spouse name: Not on file  . Number of children: Not on file  . Years of education: Not on file  . Highest education level: Not on file  Occupational History  . Not on file  Tobacco Use  . Smoking status: Current Every Day Smoker    Packs/day: 0.25    Types: Cigarettes  . Smokeless tobacco: Never Used  Vaping Use  . Vaping Use: Never used  Substance and Sexual Activity  . Alcohol use: Yes    Alcohol/week: 84.0 standard drinks    Types: 84 Cans of beer per week    Comment: daily  . Drug use: Yes    Types: Cocaine, "Crack" cocaine    Comment: states last use 07/10/2020  . Sexual activity: Yes  Other Topics Concern  . Not on  file  Social History Narrative  . Not on file   Social Determinants of Health   Financial Resource Strain:   . Difficulty of Paying Living Expenses: Not on file  Food Insecurity:   . Worried About Programme researcher, broadcasting/film/video in the Last Year: Not on file  . Ran Out of Food in the Last Year: Not on file  Transportation Needs:   . Lack of Transportation (Medical): Not on file  . Lack of Transportation (Non-Medical): Not on file  Physical Activity:   . Days of Exercise per Week: Not on file  . Minutes of Exercise per Session: Not on file  Stress:   . Feeling of Stress : Not on file  Social Connections:   . Frequency of Communication with Friends and Family: Not on file  . Frequency of Social Gatherings with Friends and Family: Not on file  . Attends Religious Services: Not on file  . Active Member of Clubs or Organizations: Not on file  . Attends Banker Meetings: Not on file  . Marital Status: Not on file   Additional Social History:     Sleep: Poor  Appetite:  Fair  Current Medications: Current Facility-Administered Medications  Medication Dose Route Frequency Provider Last Rate Last Admin  . acetaminophen (TYLENOL) tablet 650 mg  650 mg Oral Q6H PRN Clapacs, Jackquline Denmark, MD   650 mg at 10/25/20 0749  . albuterol (VENTOLIN HFA) 108 (90 Base) MCG/ACT inhaler 1-2 puff  1-2 puff Inhalation Q4H PRN Clapacs, Jackquline Denmark, MD   2 puff at 10/25/20 0749  . alum & mag hydroxide-simeth (MAALOX/MYLANTA) 200-200-20 MG/5ML suspension 30 mL  30 mL Oral Q4H PRN Clapacs, John T, MD      . atorvastatin (LIPITOR) tablet 10 mg  10 mg Oral Daily Clapacs, Jackquline Denmark, MD   10 mg at 10/25/20 0748  . bictegravir-emtricitabine-tenofovir AF (BIKTARVY) 50-200-25 MG per tablet 1 tablet  1 tablet Oral Daily Clapacs, Jackquline Denmark, MD   1 tablet at 10/25/20 0750  . ciprofloxacin (CIPRO) tablet 500 mg  500 mg Oral BID Jesse Sans, MD   500 mg at 10/25/20 0750  . citalopram (CELEXA) tablet 40 mg  40 mg Oral Daily  Clapacs, Jackquline Denmark, MD   40 mg at 10/25/20 0748  . feeding supplement (ENSURE ENLIVE / ENSURE PLUS) liquid 237 mL  237 mL Oral TID BM Jesse Sans, MD   237 mL at 10/25/20 1037  . hydrOXYzine (ATARAX/VISTARIL) tablet 50 mg  50 mg Oral TID PRN Clapacs, Jackquline Denmark, MD   50 mg at 10/25/20 0748  . magnesium hydroxide (MILK OF MAGNESIA) suspension 30 mL  30 mL Oral Daily PRN Clapacs, John T, MD      . menthol-cetylpyridinium (CEPACOL) lozenge 3 mg  1 lozenge Oral Q4H PRN Jesse Sans, MD   3  mg at 10/25/20 0750  . metroNIDAZOLE (FLAGYL) tablet 500 mg  500 mg Oral Q12H Jesse Sans, MD   500 mg at 10/25/20 0750  . mirtazapine (REMERON) tablet 7.5 mg  7.5 mg Oral QHS Jesse Sans, MD   7.5 mg at 10/24/20 2111  . ondansetron (ZOFRAN) tablet 8 mg  8 mg Oral Q8H PRN Jesse Sans, MD   8 mg at 10/18/20 1659  . oxymetazoline (AFRIN) 0.05 % nasal spray 1 spray  1 spray Each Nare BID Jesse Sans, MD      . risperiDONE (RISPERDAL) tablet 1 mg  1 mg Oral QHS Beverly Sessions, MD   1 mg at 10/24/20 2111  . sodium chloride (OCEAN) 0.65 % nasal spray 1 spray  1 spray Each Nare Q2H PRN Jesse Sans, MD      . traMADol Janean Sark) tablet 50 mg  50 mg Oral Q6H PRN Clapacs, Jackquline Denmark, MD   50 mg at 10/21/20 2126  . traZODone (DESYREL) tablet 150 mg  150 mg Oral QHS PRN Jesse Sans, MD   150 mg at 10/24/20 2112    Lab Results:  No results found for this or any previous visit (from the past 48 hour(s)).  Blood Alcohol level:  Lab Results  Component Value Date   ETH <10 10/17/2020   ETH <10 05/15/2020    Metabolic Disorder Labs: Lab Results  Component Value Date   HGBA1C 5.2 05/27/2018   MPG 102.54 05/27/2018   MPG 114 10/26/2016   No results found for: PROLACTIN Lab Results  Component Value Date   CHOL 158 01/23/2019   TRIG 63 01/23/2019   HDL 43 01/23/2019   CHOLHDL 3.7 01/23/2019   VLDL 13 01/23/2019   LDLCALC 102 (H) 01/23/2019   LDLCALC 67 05/27/2018    Physical  Findings: AIMS:  , ,  ,  ,    CIWA:    COWS:     Musculoskeletal: Strength & Muscle Tone: within normal limits Gait & Station: normal Roy leans: N/A  Psychiatric Specialty Exam: Physical Exam Vitals and nursing note reviewed.  Constitutional:      Appearance: Normal appearance.  HENT:     Head: Normocephalic and atraumatic.     Right Ear: External ear normal.     Left Ear: External ear normal.     Nose: Nose normal.     Mouth/Throat:     Mouth: Mucous membranes are moist.  Eyes:     Conjunctiva/sclera: Conjunctivae normal.  Cardiovascular:     Rate and Rhythm: Normal rate.  Pulmonary:     Effort: Pulmonary effort is normal.  Musculoskeletal:        General: No swelling. Normal range of motion.     Cervical back: Normal range of motion.  Skin:    General: Skin is warm and dry.  Neurological:     General: No focal deficit present.     Mental Status: He is alert and oriented to person, place, and time.  Psychiatric:        Attention and Perception: Attention normal.        Mood and Affect: Mood is depressed.        Speech: Speech normal.        Behavior: Behavior is not withdrawn. Behavior is cooperative.        Thought Content: Thought content includes suicidal ideation.        Cognition and Memory: Cognition and memory normal.  Judgment: Judgment normal.     Review of Systems  Constitutional: Positive for fatigue. Negative for chills.  HENT: Negative for congestion, sinus pressure, sinus pain and sore throat.   Eyes: Negative for photophobia and visual disturbance.  Respiratory: Negative for cough and shortness of breath.   Cardiovascular: Negative for chest pain and palpitations.  Gastrointestinal: Negative for constipation, diarrhea, nausea and vomiting.  Endocrine: Negative for cold intolerance and heat intolerance.  Genitourinary: Positive for frequency. Negative for dysuria and flank pain.  Musculoskeletal: Positive for arthralgias and myalgias.   Skin: Negative for rash and wound.  Allergic/Immunologic: Negative for immunocompromised state.  Neurological: Positive for headaches. Negative for dizziness.  Hematological: Negative for adenopathy. Does not bruise/bleed easily.  Psychiatric/Behavioral: Positive for dysphoric mood and suicidal ideas. Negative for hallucinations and sleep disturbance.    Blood pressure (!) 145/88, pulse 62, temperature 98.7 F (37.1 C), temperature source Oral, resp. rate 16, height 5\' 9"  (1.753 m), weight 82.1 kg, SpO2 100 %.Body mass index is 26.73 kg/m.  General Appearance: Fairly Groomed  Eye Contact:  Good  Speech:  Clear and Coherent  Volume:  Normal  Mood:  Depressed and Dysphoric  Affect:  Congruent  Thought Process:  Coherent and Linear  Orientation:  Full (Time, Place, and Person)  Thought Content:  AH as above, depressed  Suicidal Thoughts:  Yes.  without intent/plan  Homicidal Thoughts:  No  Memory:  Immediate;   Fair Recent;   Fair Remote;   Fair  Judgement:  Intact  Insight:  Fair  Psychomotor Activity:  Normal  Concentration:  Concentration: Fair and Attention Span: Fair  Recall:  of Knowledge:  Fair  Language:  Fair  Akathisia:  Negative  Handed:  Right  AIMS (if indicated):     Assets:  Communication Skills Desire for Improvement Financial Resources/Insurance Resilience Social Support  ADL's:  Intact  Cognition:  WNL  Sleep:  Number of Hours: 8     Treatment Plan Summary: Daily contact with Roy to assess and evaluate symptoms and progress in treatment, Medication management Plan  Continue risperdal 1 mg nightly for AH Continue Remeron 7.5 mg nightly for insomnia, poor appetite, and mood.  Trazodone to 150 mg nightly PRN for sleep.  Continue Celexa 40 mg daily for depression. Continue Cipro and Flagyl  for lower urinary tract infection (day 7 of 7) Repeat urinalysis and urine culture to assess clearance of UTI as he continues to have burning with  urination Tylenol and ultram for pain. Continue to monitor.  Group/milieu tx.   [employer], MD 10/25/2020, 12:23 PMPatient ID: 13/08/2020 Sr., male   DOB: 1966/01/23, 54 y.o.   MRN: [id number] Roy ID: [facility] Sr., male   DOB: 12/13/1966, 54 y.o.   MRN: [id number]

## 2020-10-25 NOTE — Progress Notes (Signed)
D: Pt alert and oriented. Pt rates depression 8/10, hopelessness 8/10, and anxiety 8/10. Pt goal: "Stay off Drugs." Pt reports energy level as low and concentration as being poor. Pt reports sleep last night as being fair. Pt did receive medications for sleep and did not find them helpful. Pt reports experiencing 10/10 generalized arthritic pain, prn med given. Pt denies experiencing any SI/HI, or AVH at this time.   A: Scheduled medications administered to pt, per MD orders. Support and encouragement provided. Frequent verbal contact made. Routine safety checks conducted q15 minutes.   R: No adverse drug reactions noted. Pt verbally contracts for safety at this time. Pt complaint with medications and treatment plan. Pt interacts well with others on the unit. Pt remains safe at this time. Will continue to monitor.

## 2020-10-26 MED ORDER — MELATONIN 5 MG PO TABS
2.5000 mg | ORAL_TABLET | Freq: Every day | ORAL | Status: DC
  Administered 2020-10-26 – 2020-10-27 (×2): 2.5 mg via ORAL
  Filled 2020-10-26 (×2): qty 1

## 2020-10-26 NOTE — Plan of Care (Signed)
Pt rates depression, anxiety and hopelessness all at 10/10.  Pt denies SI, HI and AVH. Pt was educated on care plan and verbalizes understanding. Pt was encouraged to attend groups. Torrie Mayers RN Problem: Education: Goal: Knowledge of [facility] Education information/materials will improve Outcome: Progressing Goal: Emotional status will improve Outcome: Progressing Goal: Mental status will improve Outcome: Progressing Goal: Verbalization of understanding the information provided will improve Outcome: Progressing   Problem: Activity: Goal: Interest or engagement in activities will improve Outcome: Progressing Goal: Sleeping patterns will improve Outcome: Progressing   Problem: Coping: Goal: Ability to verbalize frustrations and anger appropriately will improve Outcome: Progressing Goal: Ability to demonstrate self-control will improve Outcome: Progressing   Problem: Health Behavior/Discharge Planning: Goal: Identification of resources available to assist in meeting health care needs will improve Outcome: Progressing Goal: Compliance with treatment plan for underlying cause of condition will improve Outcome: Progressing   Problem: Physical Regulation: Goal: Ability to maintain clinical measurements within normal limits will improve Outcome: Progressing   Problem: Safety: Goal: Periods of time without injury will increase Outcome: Progressing   Problem: Education: Goal: Knowledge of disease or condition will improve Outcome: Progressing Goal: Understanding of discharge needs will improve Outcome: Progressing   Problem: Health Behavior/Discharge Planning: Goal: Ability to identify changes in lifestyle to reduce recurrence of condition will improve Outcome: Progressing Goal: Identification of resources available to assist in meeting health care needs will improve Outcome: Progressing   Problem: Physical Regulation: Goal: Complications related to the disease  process, condition or treatment will be avoided or minimized Outcome: Progressing   Problem: Safety: Goal: Ability to remain free from injury will improve Outcome: Progressing   Problem: Education: Goal: Ability to state activities that reduce stress will improve Outcome: Progressing   Problem: Coping: Goal: Ability to identify and develop effective coping behavior will improve Outcome: Progressing   Problem: Self-Concept: Goal: Ability to identify factors that promote anxiety will improve Outcome: Progressing Goal: Level of anxiety will decrease Outcome: Progressing Goal: Ability to modify response to factors that promote anxiety will improve Outcome: Progressing   Problem: Education: Goal: Utilization of techniques to improve thought processes will improve Outcome: Progressing Goal: Knowledge of the prescribed therapeutic regimen will improve Outcome: Progressing   Problem: Activity: Goal: Interest or engagement in leisure activities will improve Outcome: Progressing Goal: Imbalance in normal sleep/wake cycle will improve Outcome: Progressing   Problem: Coping: Goal: Coping ability will improve Outcome: Progressing Goal: Will verbalize feelings Outcome: Progressing   Problem: Health Behavior/Discharge Planning: Goal: Ability to make decisions will improve Outcome: Progressing Goal: Compliance with therapeutic regimen will improve Outcome: Progressing   Problem: Role Relationship: Goal: Will demonstrate positive changes in social behaviors and relationships Outcome: Progressing   Problem: Safety: Goal: Ability to disclose and discuss suicidal ideas will improve Outcome: Progressing Goal: Ability to identify and utilize support systems that promote safety will improve Outcome: Progressing   Problem: Self-Concept: Goal: Will verbalize positive feelings about self Outcome: Progressing Goal: Level of anxiety will decrease Outcome: Progressing

## 2020-10-26 NOTE — BHH Counselor (Addendum)
CSW left message with [facility] providing contact information so that referral information can be sent so referrals can be made.   CSW called [facility] and was informed "we've scheduled for the week and will start scheduling on Monday".  CSW spoke with [place].  CSW called [facility] who requested that patient call [phone] to complete intake on 10/27/2020 at 11AM.  Penni Homans, MSW, LCSW 10/26/2020 1:38 PM

## 2020-10-26 NOTE — Progress Notes (Signed)
Recreation Therapy Notes  Date: 10/26/2020  Time: 9:30 am   Location: Craft room     Behavioral response: N/A   Intervention Topic: Leisure   Discussion/Intervention: Patient did not attend group.   Clinical Observations/Feedback:  Patient did not attend group.   Edelmiro Innocent LRT/CTRS        Noretta Frier 10/26/2020 12:44 PM

## 2020-10-26 NOTE — Progress Notes (Signed)
Patient alert and orientedx 4 affect is flat but he brightens upon approach, he is calm and polite with peers and staff  thoughts are organized and coherent no distress noted, he is interacting appropriately with peers and staff, he denies SI/HI/AVH , 15 minutes safety checks maintained will continue to monitor

## 2020-10-26 NOTE — Progress Notes (Signed)
[facility] MD Progress Note  10/26/2020 12:09 PM Roy [name] Roy Sr.  MRN:  [id number]   Subjective:   "Feeling tired" Mr Roy [name] seen in his room. He reports difficulty sleeping overnight, and multiple nighttime awakenings. He continues to report depression, and suicidal ideation. He has no plans or intents to hurt himself in the hospital, but remains concerned he will hurt himself via overdosing if he leaves the hospital. He denies any homicidal ideations or visual hallucinations. Reports pain of multiple  joints due to arthritis. He has been medication compliant, and denies any side effects  He has been participating in groups, and has had no behavioral issues. He continues to desire long-term substance abuse treatment. He continues to report burning with urination after 7-day course of antibiotics. Repeat UA appears clear. Will follow-up urine culture results.    Principal Problem: Severe recurrent major depression without psychotic features (HCC) Diagnosis: Principal Problem:   Severe recurrent major depression without psychotic features (HCC) Active Problems:   HIV disease (HCC)   HTN (hypertension)   Cocaine dependence (HCC)   GERD (gastroesophageal reflux disease)   Lower urinary tract infection  Total Time spent with patient: 30 minutes  Past Psychiatric History: Patient has had several prior hospitalizations similar circumstances. Recurrent problems with drug abuse complicating his mood and making it difficult for him to maintain any stability.  Past Medical History:  Past Medical History:  Diagnosis Date  . AIDS (acquired immune deficiency syndrome) (HCC)   . Arthritis   . Asthma   . Bipolar disorder (HCC)   . Bronchitis   . Complication of anesthesia   . Coronary artery disease   . Depression   . Dysrhythmia    1st degree heart block/ brady  . GERD (gastroesophageal reflux disease)   . Hepatitis C    treated  . HIV (human immunodeficiency virus infection) (HCC)   .  HTN (hypertension)   . PONV (postoperative nausea and vomiting)     Past Surgical History:  Procedure Laterality Date  . APPLICATION OF WOUND VAC Right 09/01/2019   Procedure: APPLICATION OF WOUND VAC;  Surgeon: Kennedy Bucker, MD;  Location: [facility] ORS;  Service: Orthopedics;  Laterality: Right;  Serial # [id number]  . HERNIA REPAIR Left    inguinal  . TOE SURGERY    . TOE SURGERY Right   . TOTAL HIP ARTHROPLASTY Right 09/01/2019   Procedure: TOTAL HIP ARTHROPLASTY ANTERIOR APPROACH;  Surgeon: Kennedy Bucker, MD;  Location: [facility] ORS;  Service: Orthopedics;  Laterality: Right;   Family History:  Family History  Problem Relation Age of Onset  . Cancer Brother   . Uterine cancer Mother   . CAD Mother   . Hypertension Mother   . Hyperlipidemia Mother    Family Psychiatric  History: Denies Social History:  Social History   Substance and Sexual Activity  Alcohol Use Yes  . Alcohol/week: 84.0 standard drinks  . Types: 84 Cans of beer per week   Comment: daily     Social History   Substance and Sexual Activity  Drug Use Yes  . Types: Cocaine, "Crack" cocaine   Comment: states last use 07/10/2020    Social History   Socioeconomic History  . Marital status: Divorced    Spouse name: Not on file  . Number of children: Not on file  . Years of education: Not on file  . Highest education level: Not on file  Occupational History  . Not on file  Tobacco Use  . Smoking  status: Current Every Day Smoker    Packs/day: 0.25    Types: Cigarettes  . Smokeless tobacco: Never Used  Vaping Use  . Vaping Use: Never used  Substance and Sexual Activity  . Alcohol use: Yes    Alcohol/week: 84.0 standard drinks    Types: 84 Cans of beer per week    Comment: daily  . Drug use: Yes    Types: Cocaine, "Crack" cocaine    Comment: states last use 07/10/2020  . Sexual activity: Yes  Other Topics Concern  . Not on file  Social History Narrative  . Not on file   Social Determinants of Health    Financial Resource Strain:   . Difficulty of Paying Living Expenses: Not on file  Food Insecurity:   . Worried About Programme researcher, broadcasting/film/video in the Last Year: Not on file  . Ran Out of Food in the Last Year: Not on file  Transportation Needs:   . Lack of Transportation (Medical): Not on file  . Lack of Transportation (Non-Medical): Not on file  Physical Activity:   . Days of Exercise per Week: Not on file  . Minutes of Exercise per Session: Not on file  Stress:   . Feeling of Stress : Not on file  Social Connections:   . Frequency of Communication with Friends and Family: Not on file  . Frequency of Social Gatherings with Friends and Family: Not on file  . Attends Religious Services: Not on file  . Active Member of Clubs or Organizations: Not on file  . Attends Banker Meetings: Not on file  . Marital Status: Not on file   Additional Social History:     Sleep: Poor  Appetite:  Fair  Current Medications: Current Facility-Administered Medications  Medication Dose Route Frequency Provider Last Rate Last Admin  . acetaminophen (TYLENOL) tablet 650 mg  650 mg Oral Q6H PRN Clapacs, Jackquline Denmark, MD   650 mg at 10/25/20 0749  . albuterol (VENTOLIN HFA) 108 (90 Base) MCG/ACT inhaler 1-2 puff  1-2 puff Inhalation Q4H PRN Clapacs, Jackquline Denmark, MD   2 puff at 10/25/20 0749  . alum & mag hydroxide-simeth (MAALOX/MYLANTA) 200-200-20 MG/5ML suspension 30 mL  30 mL Oral Q4H PRN Clapacs, John T, MD      . atorvastatin (LIPITOR) tablet 10 mg  10 mg Oral Daily Clapacs, Jackquline Denmark, MD   10 mg at 10/26/20 0814  . bictegravir-emtricitabine-tenofovir AF (BIKTARVY) 50-200-25 MG per tablet 1 tablet  1 tablet Oral Daily Clapacs, Jackquline Denmark, MD   1 tablet at 10/26/20 [phone]  . citalopram (CELEXA) tablet 40 mg  40 mg Oral Daily Clapacs, Jackquline Denmark, MD   40 mg at 10/26/20 0814  . feeding supplement (ENSURE ENLIVE / ENSURE PLUS) liquid 237 mL  237 mL Oral TID BM Jesse Sans, MD   237 mL at 10/26/20 1000  .  hydrOXYzine (ATARAX/VISTARIL) tablet 50 mg  50 mg Oral TID PRN Clapacs, Jackquline Denmark, MD   50 mg at 10/25/20 0748  . magnesium hydroxide (MILK OF MAGNESIA) suspension 30 mL  30 mL Oral Daily PRN Clapacs, John T, MD      . melatonin tablet 3 mg  3 mg Oral QHS Jesse Sans, MD      . menthol-cetylpyridinium (CEPACOL) lozenge 3 mg  1 lozenge Oral Q4H PRN Jesse Sans, MD   3 mg at 10/25/20 0750  . mirtazapine (REMERON) tablet 7.5 mg  7.5 mg Oral QHS Neale Burly,  Tylene Fantasia, MD   7.5 mg at 10/25/20 2114  . ondansetron (ZOFRAN) tablet 8 mg  8 mg Oral Q8H PRN Jesse Sans, MD   8 mg at 10/18/20 1659  . oxymetazoline (AFRIN) 0.05 % nasal spray 1 spray  1 spray Each Nare BID Jesse Sans, MD   1 spray at 10/26/20 0815  . risperiDONE (RISPERDAL) tablet 1 mg  1 mg Oral QHS Beverly Sessions, MD   1 mg at 10/25/20 2119  . sodium chloride (OCEAN) 0.65 % nasal spray 1 spray  1 spray Each Nare Q2H PRN Jesse Sans, MD      . traMADol Janean Sark) tablet 50 mg  50 mg Oral Q6H PRN Clapacs, Jackquline Denmark, MD   50 mg at 10/25/20 1652  . traZODone (DESYREL) tablet 150 mg  150 mg Oral QHS PRN Jesse Sans, MD   150 mg at 10/24/20 2112    Lab Results:  Results for orders placed or performed during the hospital encounter of 10/18/20 (from the past 48 hour(s))  Urinalysis, Routine w reflex microscopic Urine, Random     Status: Abnormal   Collection Time: 10/25/20 10:18 AM  Result Value Ref Range   Color, Urine YELLOW (A) YELLOW   APPearance CLEAR (A) CLEAR   Specific Gravity, Urine 1.012 1.005 - 1.030   pH 7.0 5.0 - 8.0   Glucose, UA NEGATIVE NEGATIVE mg/dL   Hgb urine dipstick NEGATIVE NEGATIVE   Bilirubin Urine NEGATIVE NEGATIVE   Ketones, ur NEGATIVE NEGATIVE mg/dL   Protein, ur NEGATIVE NEGATIVE mg/dL   Nitrite NEGATIVE NEGATIVE   Leukocytes,Ua TRACE (A) NEGATIVE   RBC / HPF 0-5 0 - 5 RBC/hpf   WBC, UA 0-5 0 - 5 WBC/hpf   Bacteria, UA NONE SEEN NONE SEEN   Squamous Epithelial / LPF 0-5 0 - 5   Mucus  PRESENT     Comment: Performed at [facility], [address] [place], [place] [address]    Blood Alcohol level:  Lab Results  Component Value Date   [facility] <10 10/17/2020   ETH <10 05/15/2020    Metabolic Disorder Labs: Lab Results  Component Value Date   HGBA1C 5.2 05/27/2018   MPG 102.54 05/27/2018   MPG 114 10/26/2016   No results found for: PROLACTIN Lab Results  Component Value Date   CHOL 158 01/23/2019   TRIG 63 01/23/2019   HDL 43 01/23/2019   CHOLHDL 3.7 01/23/2019   VLDL 13 01/23/2019   LDLCALC 102 (H) 01/23/2019   LDLCALC 67 05/27/2018    Physical Findings: AIMS:  , ,  ,  ,    CIWA:    COWS:     Musculoskeletal: Strength & Muscle Tone: within normal limits Gait & Station: normal Patient leans: N/A  Psychiatric Specialty Exam: Physical Exam Vitals and nursing note reviewed.  Constitutional:      Appearance: Normal appearance.  HENT:     Head: Normocephalic and atraumatic.     Right Ear: External ear normal.     Left Ear: External ear normal.     Nose: Nose normal.     Mouth/Throat:     Mouth: Mucous membranes are moist.  Eyes:     Conjunctiva/sclera: Conjunctivae normal.  Cardiovascular:     Rate and Rhythm: Normal rate.  Pulmonary:     Effort: Pulmonary effort is normal.  Musculoskeletal:        General: No swelling. Normal range of motion.     Cervical  back: Normal range of motion.  Skin:    General: Skin is warm and dry.  Neurological:     General: No focal deficit present.     Mental Status: He is alert and oriented to person, place, and time.  Psychiatric:        Attention and Perception: Attention normal.        Mood and Affect: Mood is depressed.        Speech: Speech normal.        Behavior: Behavior is not withdrawn. Behavior is cooperative.        Thought Content: Thought content includes suicidal ideation.        Cognition and Memory: Cognition and memory normal.        Judgment: Judgment normal.     Review of  Systems  Constitutional: Positive for fatigue. Negative for chills.  HENT: Negative for congestion, sinus pressure, sinus pain and sore throat.   Eyes: Negative for photophobia and visual disturbance.  Respiratory: Negative for cough and shortness of breath.   Cardiovascular: Negative for chest pain and palpitations.  Gastrointestinal: Negative for constipation, diarrhea, nausea and vomiting.  Endocrine: Negative for cold intolerance and heat intolerance.  Genitourinary: Positive for frequency. Negative for dysuria and flank pain.  Musculoskeletal: Positive for arthralgias and myalgias.  Skin: Negative for rash and wound.  Allergic/Immunologic: Negative for immunocompromised state.  Neurological: Positive for headaches. Negative for dizziness.  Hematological: Negative for adenopathy. Does not bruise/bleed easily.  Psychiatric/Behavioral: Positive for dysphoric mood and suicidal ideas. Negative for hallucinations and sleep disturbance.    Blood pressure 137/88, pulse (!) 53, temperature 98.7 F (37.1 C), temperature source Oral, resp. rate 17, height  (1.753 m), weight 82.1 kg, SpO2 100 %.Body mass index is 26.73 kg/m.  General Appearance: Fairly Groomed  Eye Contact:  Good  Speech:  Clear and Coherent  Volume:  Normal  Mood:  Depressed and Dysphoric  Affect:  Congruent  Thought Process:  Coherent and Linear  Orientation:  Full (Time, Place, and Person)  Thought Content:  AH as above, depressed  Suicidal Thoughts:  Yes.  without intent/plan  Homicidal Thoughts:  No  Memory:  Immediate;   Fair Recent;   Fair Remote;   Fair  Judgement:  Intact  Insight:  Fair  Psychomotor Activity:  Normal  Concentration:  Concentration: Fair and Attention Span: Fair  Recall:  [employer] of Knowledge:  Fair  Language:  Fair  Akathisia:  Negative  Handed:  Right  AIMS (if indicated):     Assets:  Communication Skills Desire for Improvement Financial  Resources/Insurance Resilience Social Support  ADL's:  Intact  Cognition:  WNL  Sleep:  Number of Hours: 8     Treatment Plan Summary: Daily contact with patient to assess and evaluate symptoms and progress in treatment, Medication management Plan  Continue risperdal 1 mg nightly for AH Continue Remeron 7.5 mg nightly for insomnia, poor appetite, and mood.  Trazodone to 150 mg nightly PRN for sleep.  Continue Celexa 40 mg daily for depression. Completed Cipro and Flagyl  for lower urinary tract infection (day 7 of 7) Repeat urinalysis looks clear. Awaiting urine culture to determine if infection fully cleared Tylenol and ultram for pain. Continue to monitor.  Group/milieu tx.   Jesse Sans, MD 10/26/2020, 12:09 PMPatient ID: [name], male   DOB: 10/12/1966, 54 y.o.   MRN: [id number] Patient ID: Jhoel Stieg [facility] Sr., male   DOB: [birth year], 53  y.o.   MRN: [id number]

## 2020-10-26 NOTE — Progress Notes (Signed)
Patient is cooperative with treatment and he was pleasant on approach. He interacted well with peers and staff. He denies SI, HI and AVH. He does still endorse depression. He was compliant with medication regime. Appeared to rest well through out the night.

## 2020-10-26 NOTE — BHH Group Notes (Signed)
LCSW Group Therapy Note  10/26/2020 2:16 PM  Type of Therapy/Topic:  Group Therapy:  Emotion Regulation  Participation Level:  Active   Description of Group:   The purpose of this group is to assist patients in learning to regulate negative emotions and experience positive emotions. Patients will be guided to discuss ways in which they have been vulnerable to their negative emotions. These vulnerabilities will be juxtaposed with experiences of positive emotions or situations, and patients will be challenged to use positive emotions to combat negative ones. Special emphasis will be placed on coping with negative emotions in conflict situations, and patients will process healthy conflict resolution skills.  Therapeutic Goals: 1. Patient will identify two positive emotions or experiences to reflect on in order to balance out negative emotions 2. Patient will label two or more emotions that they find the most difficult to experience 3. Patient will demonstrate positive conflict resolution skills through discussion and/or role plays  Summary of Patient Progress: Pt was able to identify worthlessness and guilt as how he felt prior to admission. He shared that oftentimes when he begins hanging out with "old" friends this leads to inevitable relapse. Pt stated that he often feels that when he has been clean for awhile that it is safe for him to go back to hanging out with these "old" friends and that he can use once without ending up back in his addiction. Pt acknowledged that this was something that he needed to work on. Saying no, getting a sponsor, and "playing the tape all the way through" were discussed as ways to help deal with emotional regulation and these thought patterns.   Therapeutic Modalities:   Cognitive Behavioral Therapy Feelings Identification Dialectical Behavioral Therapy  Roy [name]. Roy [name], MSW, LCSW, LCAS 10/26/2020 2:16 PM

## 2020-10-26 NOTE — Progress Notes (Signed)
D- Patient alert and oriented. Affect/mood is pleasant, calm and cooperative. Pt denies SI, HI, AVH, and pain.   A- Scheduled medications administered to patient, per MD orders. Support and encouragement provided.  Routine safety checks conducted every 15 minutes.  Patient informed to notify staff with problems or concerns.  R- No adverse drug reactions noted. Patient contracts for safety at this time. Patient compliant with medications and treatment plan. Patient receptive, calm, and cooperative. Patient interacts well with others on the unit.  Patient remains safe at this time.   Tiffanyann Deroo RN 

## 2020-10-27 LAB — URINE CULTURE: Culture: NO GROWTH

## 2020-10-27 LAB — GLUCOSE, CAPILLARY: Glucose-Capillary: 103 mg/dL — ABNORMAL HIGH (ref 70–99)

## 2020-10-27 MED ORDER — MIRTAZAPINE 7.5 MG PO TABS: 7.5000 mg | ORAL_TABLET | Freq: Every day | ORAL | 1 refills | Status: DC

## 2020-10-27 MED ORDER — RISPERIDONE 1 MG PO TABS: 1.0000 mg | ORAL_TABLET | Freq: Every day | ORAL | 1 refills | Status: DC

## 2020-10-27 MED ORDER — TRAZODONE HCL 150 MG PO TABS: 150.0000 mg | ORAL_TABLET | Freq: Every evening | ORAL | 1 refills | Status: DC | PRN

## 2020-10-27 MED ORDER — CITALOPRAM HYDROBROMIDE 40 MG PO TABS: 40.0000 mg | ORAL_TABLET | Freq: Every day | ORAL | 1 refills | Status: DC

## 2020-10-27 NOTE — Progress Notes (Addendum)
In the morning pt endorsed suicidal ideation without plan or intent reports this is intermittent and chronic and is related to being homeless and not knowing where he will go after discharge. Later these feelings subsided. Pt contracts for safety.  Pt is alert and oriented to person, place, time and situation. Pt is calm, cooperative, denies suicidal and homicidal ideation, denies feelings of depression and anxiety. Pt is social with peers, out in the dayroom watching tv, on the telephone with his significant other several times, is out for meals and is medication complaint. [name] appetite is good. Pt reports he didn't sleep well last evening. Pt reports that he started seeing lights and stars and requested to have his blood sugar checked which was 103 at 1629. Pt vitals checked to at this time and were stable. Pt reports afterwards that the stars he was seeing might be hallucinations and reported he will speak to the doctor about that tomorrow. No distress noted, none reported, will continue to monitor pt per Q15 minute face checks and monitor for safety and progress.

## 2020-10-27 NOTE — BHH Counselor (Signed)
CSW met with the patient and informed that he has a screening with [facility] at [place] provided the contact information.  Patient completed the screening and was informed that he could NOT be accepted due to upcoming court.    CSW asked pt his discharge plan and pt reports that he does not have one.  CSW assessed if pt could go to his girlfriends home and pt reports that he can not due to her living on [address]. He reports that he doesn't have much family support.  He reports that he would like to the shelter in [place].  CSW informed that they are typically filled.  CSW did contact Psychologist, forensic with [facility] [employer] and he reports that there are no beds available.    Pt reports that he would go to shelter in [place] or to the streets.  He declined several times to discuss [employer] or be provided any information on it.   CSW encouraged patient to think about his options.   Assunta Curtis, MSW, LCSW 10/27/2020 11:16 AM

## 2020-10-27 NOTE — BHH Group Notes (Signed)
LCSW Group Therapy Note  10/27/2020 2:19 PM  Type of Therapy/Topic:  Group Therapy:  Balance in Life  Participation Level:  Did Not Attend  Description of Group:    This group will address the concept of balance and how it feels and looks when one is unbalanced. Patients will be encouraged to process areas in their lives that are out of balance and identify reasons for remaining unbalanced. Facilitators will guide patients in utilizing problem-solving interventions to address and correct the stressor making their life unbalanced. Understanding and applying boundaries will be explored and addressed for obtaining and maintaining a balanced life. Patients will be encouraged to explore ways to assertively make their unbalanced needs known to significant others in their lives, using other group members and facilitator for support and feedback.  Therapeutic Goals: 1. Patient will identify two or more emotions or situations they have that consume much of in their lives. 2. Patient will identify signs/triggers that life has become out of balance:  3. Patient will identify two ways to set boundaries in order to achieve balance in their lives:  4. Patient will demonstrate ability to communicate their needs through discussion and/or role plays  Summary of Patient Progress: X  Therapeutic Modalities:   Cognitive Behavioral Therapy Solution-Focused Therapy Assertiveness Training  Penni Homans MSW, LCSW 10/27/2020 2:19 PM

## 2020-10-27 NOTE — Progress Notes (Signed)
[facility] MD Progress Note  10/27/2020 3:56 PM Roy Joyce Gross Droge Sr.  MRN:  [id number]   Subjective:   "Feeling okay" Roy [name] seen in his room. He reports difficulty sleeping overnight, but notes this has been a persistent problem for years. He feels his depression and anxiety have improved. He denies suicidal ideations, homicidal ideations, visual hallucinations, and auditory hallucinations. He notes he was declined by [facility] inpatient substance abuse program due to upcoming court date on Nov 28th. He states he plans to go to the shelter in [place] until he receives his check on Dec 1st. Denies pain today.   He has been medication compliant, and denies any side effects    Principal Problem: Severe recurrent major depression without psychotic features (HCC) Diagnosis: Principal Problem:   Severe recurrent major depression without psychotic features (HCC) Active Problems:   HIV disease (HCC)   HTN (hypertension)   Cocaine dependence (HCC)   GERD (gastroesophageal reflux disease)   Lower urinary tract infection  Total Time spent with patient: 30 minutes  Past Psychiatric History: Patient has had several prior hospitalizations similar circumstances. Recurrent problems with drug abuse complicating his mood and making it difficult for him to maintain any stability.  Past Medical History:  Past Medical History:  Diagnosis Date  . AIDS (acquired immune deficiency syndrome) (HCC)   . Arthritis   . Asthma   . Bipolar disorder (HCC)   . Bronchitis   . Complication of anesthesia   . Coronary artery disease   . Depression   . Dysrhythmia    1st degree heart block/ brady  . GERD (gastroesophageal reflux disease)   . Hepatitis C    treated  . HIV (human immunodeficiency virus infection) (HCC)   . HTN (hypertension)   . PONV (postoperative nausea and vomiting)     Past Surgical History:  Procedure Laterality Date  . APPLICATION OF WOUND VAC Right 09/01/2019   Procedure: APPLICATION OF  WOUND VAC;  Surgeon: Kennedy Bucker, MD;  Location: [facility] ORS;  Service: Orthopedics;  Laterality: Right;  Serial # [id number]  . HERNIA REPAIR Left    inguinal  . TOE SURGERY    . TOE SURGERY Right   . TOTAL HIP ARTHROPLASTY Right 09/01/2019   Procedure: TOTAL HIP ARTHROPLASTY ANTERIOR APPROACH;  Surgeon: Kennedy Bucker, MD;  Location: [facility] ORS;  Service: Orthopedics;  Laterality: Right;   Family History:  Family History  Problem Relation Age of Onset  . Cancer Brother   . Uterine cancer Mother   . CAD Mother   . Hypertension Mother   . Hyperlipidemia Mother    Family Psychiatric  History: Denies Social History:  Social History   Substance and Sexual Activity  Alcohol Use Yes  . Alcohol/week: 84.0 standard drinks  . Types: 84 Cans of beer per week   Comment: daily     Social History   Substance and Sexual Activity  Drug Use Yes  . Types: Cocaine, "Crack" cocaine   Comment: states last use 07/10/2020    Social History   Socioeconomic History  . Marital status: Divorced    Spouse name: Not on file  . Number of children: Not on file  . Years of education: Not on file  . Highest education level: Not on file  Occupational History  . Not on file  Tobacco Use  . Smoking status: Current Every Day Smoker    Packs/day: 0.25    Types: Cigarettes  . Smokeless tobacco: Never Used  Vaping Use  .  Vaping Use: Never used  Substance and Sexual Activity  . Alcohol use: Yes    Alcohol/week: 84.0 standard drinks    Types: 84 Cans of beer per week    Comment: daily  . Drug use: Yes    Types: Cocaine, "Crack" cocaine    Comment: states last use 07/10/2020  . Sexual activity: Yes  Other Topics Concern  . Not on file  Social History Narrative  . Not on file   Social Determinants of Health   Financial Resource Strain:   . Difficulty of Paying Living Expenses: Not on file  Food Insecurity:   . Worried About Programme researcher, broadcasting/film/video in the Last Year: Not on file  . Ran Out of Food in  the Last Year: Not on file  Transportation Needs:   . Lack of Transportation (Medical): Not on file  . Lack of Transportation (Non-Medical): Not on file  Physical Activity:   . Days of Exercise per Week: Not on file  . Minutes of Exercise per Session: Not on file  Stress:   . Feeling of Stress : Not on file  Social Connections:   . Frequency of Communication with Friends and Family: Not on file  . Frequency of Social Gatherings with Friends and Family: Not on file  . Attends Religious Services: Not on file  . Active Member of Clubs or Organizations: Not on file  . Attends Banker Meetings: Not on file  . Marital Status: Not on file   Additional Social History:     Sleep: Poor  Appetite:  Fair  Current Medications: Current Facility-Administered Medications  Medication Dose Route Frequency Provider Last Rate Last Admin  . acetaminophen (TYLENOL) tablet 650 mg  650 mg Oral Q6H PRN Clapacs, Jackquline Denmark, MD   650 mg at 10/25/20 0749  . albuterol (VENTOLIN HFA) 108 (90 Base) MCG/ACT inhaler 1-2 puff  1-2 puff Inhalation Q4H PRN Clapacs, Jackquline Denmark, MD   2 puff at 10/25/20 0749  . alum & mag hydroxide-simeth (MAALOX/MYLANTA) 200-200-20 MG/5ML suspension 30 mL  30 mL Oral Q4H PRN Clapacs, John T, MD      . atorvastatin (LIPITOR) tablet 10 mg  10 mg Oral Daily Clapacs, Jackquline Denmark, MD   10 mg at 10/27/20 0905  . bictegravir-emtricitabine-tenofovir AF (BIKTARVY) 50-200-25 MG per tablet 1 tablet  1 tablet Oral Daily Clapacs, Jackquline Denmark, MD   1 tablet at 10/27/20 0905  . citalopram (CELEXA) tablet 40 mg  40 mg Oral Daily Clapacs, Jackquline Denmark, MD   40 mg at 10/27/20 0905  . feeding supplement (ENSURE ENLIVE / ENSURE PLUS) liquid 237 mL  237 mL Oral TID BM Jesse Sans, MD   237 mL at 10/27/20 1158  . hydrOXYzine (ATARAX/VISTARIL) tablet 50 mg  50 mg Oral TID PRN Clapacs, Jackquline Denmark, MD   50 mg at 10/26/20 2135  . magnesium hydroxide (MILK OF MAGNESIA) suspension 30 mL  30 mL Oral Daily PRN Clapacs, John  T, MD      . melatonin tablet 2.5 mg  2.5 mg Oral QHS Jesse Sans, MD   2.5 mg at 10/26/20 2135  . menthol-cetylpyridinium (CEPACOL) lozenge 3 mg  1 lozenge Oral Q4H PRN Jesse Sans, MD   3 mg at 10/25/20 0750  . mirtazapine (REMERON) tablet 7.5 mg  7.5 mg Oral QHS Jesse Sans, MD   7.5 mg at 10/26/20 2100  . ondansetron (ZOFRAN) tablet 8 mg  8 mg Oral Q8H PRN  Jesse Sans, MD   8 mg at 10/18/20 1659  . oxymetazoline (AFRIN) 0.05 % nasal spray 1 spray  1 spray Each Nare BID Jesse Sans, MD   1 spray at 10/27/20 1156  . risperiDONE (RISPERDAL) tablet 1 mg  1 mg Oral QHS Beverly Sessions, MD   1 mg at 10/26/20 2135  . sodium chloride (OCEAN) 0.65 % nasal spray 1 spray  1 spray Each Nare Q2H PRN Jesse Sans, MD      . traMADol Janean Sark) tablet 50 mg  50 mg Oral Q6H PRN Clapacs, Jackquline Denmark, MD   50 mg at 10/25/20 1652  . traZODone (DESYREL) tablet 150 mg  150 mg Oral QHS PRN Jesse Sans, MD   150 mg at 10/24/20 2112    Lab Results:  No results found for this or any previous visit (from the past 48 hour(s)).  Blood Alcohol level:  Lab Results  Component Value Date   ETH <10 10/17/2020   ETH <10 05/15/2020    Metabolic Disorder Labs: Lab Results  Component Value Date   HGBA1C 5.2 05/27/2018   MPG 102.54 05/27/2018   MPG 114 10/26/2016   No results found for: PROLACTIN Lab Results  Component Value Date   CHOL 158 01/23/2019   TRIG 63 01/23/2019   HDL 43 01/23/2019   CHOLHDL 3.7 01/23/2019   VLDL 13 01/23/2019   LDLCALC 102 (H) 01/23/2019   LDLCALC 67 05/27/2018    Physical Findings: AIMS:  , ,  ,  ,    CIWA:    COWS:     Musculoskeletal: Strength & Muscle Tone: within normal limits Gait & Station: normal Patient leans: N/A  Psychiatric Specialty Exam: Physical Exam Vitals and nursing note reviewed.  Constitutional:      Appearance: Normal appearance.  HENT:     Head: Normocephalic and atraumatic.     Right Ear: External ear normal.      Left Ear: External ear normal.     Nose: Nose normal.     Mouth/Throat:     Mouth: Mucous membranes are moist.  Eyes:     Conjunctiva/sclera: Conjunctivae normal.  Cardiovascular:     Rate and Rhythm: Normal rate.  Pulmonary:     Effort: Pulmonary effort is normal.  Musculoskeletal:        General: No swelling. Normal range of motion.     Cervical back: Normal range of motion.  Skin:    General: Skin is warm and dry.  Neurological:     General: No focal deficit present.     Mental Status: He is alert and oriented to person, place, and time.  Psychiatric:        Attention and Perception: Attention normal.        Mood and Affect: Mood is not depressed.        Speech: Speech normal.        Behavior: Behavior is not withdrawn. Behavior is cooperative.        Thought Content: Thought content does not include suicidal ideation.        Cognition and Memory: Cognition and memory normal.        Judgment: Judgment normal.     Review of Systems  Constitutional: Negative for chills and fatigue.  HENT: Negative for congestion, sinus pressure, sinus pain and sore throat.   Eyes: Negative for photophobia and visual disturbance.  Respiratory: Negative for cough and shortness of breath.   Cardiovascular: Negative for chest  pain and palpitations.  Gastrointestinal: Negative for constipation, diarrhea, nausea and vomiting.  Endocrine: Negative for cold intolerance and heat intolerance.  Genitourinary: Positive for frequency. Negative for dysuria and flank pain.  Musculoskeletal: Positive for arthralgias and myalgias.  Skin: Negative for rash and wound.  Allergic/Immunologic: Negative for immunocompromised state.  Neurological: Negative for dizziness and headaches.  Hematological: Negative for adenopathy. Does not bruise/bleed easily.  Psychiatric/Behavioral: Negative for dysphoric mood, hallucinations, sleep disturbance and suicidal ideas.    Blood pressure (!) 125/94, pulse (!) 57,  temperature 98.4 F (36.9 C), temperature source Oral, resp. rate 17, height 5\' 9"  (1.753 m), weight 82.1 kg, SpO2 100 %.Body mass index is 26.73 kg/m.  General Appearance: Fairly Groomed  Eye Contact:  Good  Speech:  Clear and Coherent  Volume:  Normal  Mood:  Depressed and Dysphoric  Affect:  Congruent  Thought Process:  Coherent and Linear  Orientation:  Full (Time, Place, and Person)  Thought Content: Logical  Suicidal Thoughts:  No  Homicidal Thoughts:  No  Memory:  Immediate;   Fair Recent;   Fair Remote;   Fair  Judgement:  Intact  Insight:  Fair  Psychomotor Activity:  Normal  Concentration:  Concentration: Fair and Attention Span: Fair  Recall:  [employer] of Knowledge:  Fair  Language:  Fair  Akathisia:  Negative  Handed:  Right  AIMS (if indicated):     Assets:  Communication Skills Desire for Improvement Financial Resources/Insurance Resilience Social Support  ADL's:  Intact  Cognition:  WNL  Sleep:  Number of Hours: 8     Treatment Plan Summary: Daily contact with patient to assess and evaluate symptoms and progress in treatment, Medication management Plan  Continue risperdal 1 mg nightly for AH Continue Remeron 7.5 mg nightly for insomnia, poor appetite, and mood.  Trazodone to 150 mg nightly PRN for sleep.  Continue Celexa 40 mg daily for depression. Completed Cipro and Flagyl  for lower urinary tract infection (day 7 of 7) Repeat urinalysis and culture show infection has cleared Tylenol and ultram for pain. Continue to monitor.  Group/milieu tx.   Jesse Sans, MD 10/27/2020, 3:56 PMPatient ID: Roy Register Sr., male   DOB: [birth year], 54 y.o.   MRN: [id number] Patient ID: Roy Feinberg [facility] Sr., male   DOB: 1966/07/15, 54 y.o.   MRN: [id number]

## 2020-10-27 NOTE — Progress Notes (Signed)
Recreation Therapy Notes  Date: 10/27/2020  Time: 9:30 am   Location: Craft room  Behavioral response: Appropriate  Intervention Topic: Communication   Discussion/Intervention:  Group content today was focused on communication. The group defined communication and ways to communicate with others. Individuals stated reason why communication is important and some reasons to communicate with others. Patients expressed if they thought they were good at communicating with others and ways they could improve their communication skills. The group identified important parts of communication and some experiences they have had in the past with communication. The group participated in the intervention "Words in a Bag", where they had a chance to test out their communication skills and identify ways to improve their communication techniques.   Clinical Observations/Feedback: Patient came to group and expressed that communication can be done by reading. He stated that he sometimes communicates by talking to God. Individual was social with peers and staff while participating in the intervention.  Lively Haberman LRT/CTRS           Teela Narducci 10/27/2020 10:54 AM

## 2020-10-28 NOTE — Progress Notes (Signed)
Patient alert and orientedx 4 affect is flat but he brightens upon approach, he appears anxious hej was offered emotional support, he interacted appropriately with peers and staff  thoughts are organized and coherent, he denies SI/HI/AVH , 15 minutes safety checks maintained will continue to monitor

## 2020-10-28 NOTE — Plan of Care (Signed)
Pt rates depression , anxiety and hopelessness all at 10/10. Pt denies SI, HI and AVH. Pt was educated on care plan and verbalizes understanding. Torrie Mayers RN Problem: Education: Goal: Knowledge of [facility] Education information/materials will improve Outcome: Progressing Goal: Emotional status will improve Outcome: Progressing Goal: Mental status will improve Outcome: Progressing Goal: Verbalization of understanding the information provided will improve Outcome: Progressing   Problem: Activity: Goal: Interest or engagement in activities will improve Outcome: Progressing Goal: Sleeping patterns will improve Outcome: Progressing   Problem: Coping: Goal: Ability to verbalize frustrations and anger appropriately will improve Outcome: Progressing Goal: Ability to demonstrate self-control will improve Outcome: Progressing   Problem: Health Behavior/Discharge Planning: Goal: Identification of resources available to assist in meeting health care needs will improve Outcome: Progressing Goal: Compliance with treatment plan for underlying cause of condition will improve Outcome: Progressing   Problem: Physical Regulation: Goal: Ability to maintain clinical measurements within normal limits will improve Outcome: Progressing   Problem: Safety: Goal: Periods of time without injury will increase Outcome: Progressing   Problem: Education: Goal: Knowledge of disease or condition will improve Outcome: Progressing Goal: Understanding of discharge needs will improve Outcome: Progressing   Problem: Health Behavior/Discharge Planning: Goal: Ability to identify changes in lifestyle to reduce recurrence of condition will improve Outcome: Progressing Goal: Identification of resources available to assist in meeting health care needs will improve Outcome: Progressing   Problem: Physical Regulation: Goal: Complications related to the disease process, condition or treatment will  be avoided or minimized Outcome: Progressing   Problem: Safety: Goal: Ability to remain free from injury will improve Outcome: Progressing   Problem: Education: Goal: Ability to state activities that reduce stress will improve Outcome: Progressing   Problem: Coping: Goal: Ability to identify and develop effective coping behavior will improve Outcome: Progressing   Problem: Self-Concept: Goal: Ability to identify factors that promote anxiety will improve Outcome: Progressing Goal: Level of anxiety will decrease Outcome: Progressing Goal: Ability to modify response to factors that promote anxiety will improve Outcome: Progressing   Problem: Education: Goal: Utilization of techniques to improve thought processes will improve Outcome: Progressing Goal: Knowledge of the prescribed therapeutic regimen will improve Outcome: Progressing   Problem: Activity: Goal: Interest or engagement in leisure activities will improve Outcome: Progressing Goal: Imbalance in normal sleep/wake cycle will improve Outcome: Progressing   Problem: Coping: Goal: Coping ability will improve Outcome: Progressing Goal: Will verbalize feelings Outcome: Progressing   Problem: Health Behavior/Discharge Planning: Goal: Ability to make decisions will improve Outcome: Progressing Goal: Compliance with therapeutic regimen will improve Outcome: Progressing   Problem: Role Relationship: Goal: Will demonstrate positive changes in social behaviors and relationships Outcome: Progressing   Problem: Safety: Goal: Ability to disclose and discuss suicidal ideas will improve Outcome: Progressing Goal: Ability to identify and utilize support systems that promote safety will improve Outcome: Progressing   Problem: Self-Concept: Goal: Will verbalize positive feelings about self Outcome: Progressing Goal: Level of anxiety will decrease Outcome: Progressing

## 2020-10-28 NOTE — Plan of Care (Signed)
  Problem: Group Participation Goal: STG - Patient will engage in groups without prompting or encouragement from LRT x3 group sessions within 5 recreation therapy group sessions Description: STG - Patient will engage in groups without prompting or encouragement from LRT x3 group sessions within 5 recreation therapy group sessions Outcome: Completed/Met

## 2020-10-28 NOTE — Discharge Summary (Signed)
Physician Discharge Summary Note  Patient:  Roy Kedzierski Sr. is an 54 y.o., male MRN:  [id number] DOB:  03/26/1966 Patient phone:  [phone] (home)  Patient address:   [address] [place] [address],  Total Time spent with patient: 30 minutes  Date of Admission:  10/18/2020 Date of Discharge: 10/28/2020  Reason for Admission:  Worsening depression with suicidal ideation with plan to walk into traffic  Principal Problem: Severe recurrent major depression without psychotic features [facility]) Discharge Diagnoses: Principal Problem:   Severe recurrent major depression without psychotic features (HCC) Active Problems:   HIV disease (HCC)   HTN (hypertension)   Cocaine dependence (HCC)   GERD (gastroesophageal reflux disease)   Past Psychiatric History: Patient has had several prior hospitalizations similar circumstances. Recurrent problems with drug abuse complicating his mood and making it difficult for him to maintain any stability.  Past Medical History:  Past Medical History:  Diagnosis Date  . AIDS (acquired immune deficiency syndrome) (HCC)   . Arthritis   . Asthma   . Bipolar disorder (HCC)   . Bronchitis   . Complication of anesthesia   . Coronary artery disease   . Depression   . Dysrhythmia    1st degree heart block/ brady  . GERD (gastroesophageal reflux disease)   . Hepatitis C    treated  . HIV (human immunodeficiency virus infection) (HCC)   . HTN (hypertension)   . PONV (postoperative nausea and vomiting)     Past Surgical History:  Procedure Laterality Date  . APPLICATION OF WOUND VAC Right 09/01/2019   Procedure: APPLICATION OF WOUND VAC;  Surgeon: Kennedy Bucker, MD;  Location: [facility] ORS;  Service: Orthopedics;  Laterality: Right;  Serial # [id number]  . HERNIA REPAIR Left    inguinal  . TOE SURGERY    . TOE SURGERY Right   . TOTAL HIP ARTHROPLASTY Right 09/01/2019   Procedure: TOTAL HIP ARTHROPLASTY ANTERIOR APPROACH;  Surgeon: Kennedy Bucker, MD;  Location: [facility] ORS;  Service: Orthopedics;  Laterality: Right;   Family History:  Family History  Problem Relation Age of Onset  . Cancer Brother   . Uterine cancer Mother   . CAD Mother   . Hypertension Mother   . Hyperlipidemia Mother    Family Psychiatric  History: Denies Social History:  Social History   Substance and Sexual Activity  Alcohol Use Yes  . Alcohol/week: 84.0 standard drinks  . Types: 84 Cans of beer per week   Comment: daily     Social History   Substance and Sexual Activity  Drug Use Yes  . Types: Cocaine, "Crack" cocaine   Comment: states last use 07/10/2020    Social History   Socioeconomic History  . Marital status: Divorced    Spouse name: Not on file  . Number of children: Not on file  . Years of education: Not on file  . Highest education level: Not on file  Occupational History  . Not on file  Tobacco Use  . Smoking status: Current Every Day Smoker    Packs/day: 0.25    Types: Cigarettes  . Smokeless tobacco: Never Used  Vaping Use  . Vaping Use: Never used  Substance and Sexual Activity  . Alcohol use: Yes    Alcohol/week: 84.0 standard drinks    Types: 84 Cans of beer per week    Comment: daily  . Drug use: Yes    Types: Cocaine, "Crack" cocaine    Comment: states last use 07/10/2020  .  Sexual activity: Yes  Other Topics Concern  . Not on file  Social History Narrative  . Not on file   Social Determinants of Health   Financial Resource Strain:   . Difficulty of Paying Living Expenses: Not on file  Food Insecurity:   . Worried About Programme researcher, broadcasting/film/video in the Last Year: Not on file  . Ran Out of Food in the Last Year: Not on file  Transportation Needs:   . Lack of Transportation (Medical): Not on file  . Lack of Transportation (Non-Medical): Not on file  Physical Activity:   . Days of Exercise per Week: Not on file  . Minutes of Exercise per Session: Not on file  Stress:   . Feeling of Stress : Not on file   Social Connections:   . Frequency of Communication with Friends and Family: Not on file  . Frequency of Social Gatherings with Friends and Family: Not on file  . Attends Religious Services: Not on file  . Active Member of Clubs or Organizations: Not on file  . Attends Banker Meetings: Not on file  . Marital Status: Not on file    Hospital Course:  Patient admitted for worsening depression and suicidal ideation with plan to walk into traffic. He had been off his psychiatric medications for several weeks, and stopped following up with outpatient provider. He had spent his entire disability check on cocaine as well. Upon admission he was restarted on Celexa 40 mg daily. Remeron 7.5 mg QHS added for mood and sleep. Gabapentin discontinued per patient request due to sexual side effects. Trazodone decreased to 150 mg nightly PRN for sleep. He was continued on Biktarvy for HIV treatment, and was given Tramadol Q6hr PRN for pain. During admission he participated in groups, and was seen interacting well with peers. When told he was being discharged on Friday, he did come forward with several symptoms on Thursday night. He stated he was seeing stars, and vital signs and FSBS were all within normal limits. He then stated they must be visual hallucinations. However, on Friday morning patient denied suicidal ideation, homicidal ideation, visual hallucinations, and auditory hallucinations. It appears patient was exaggerating symptoms temporarily for clear secondary gain of hospital shelter and food. He is future oriented at this time discussing housing options and relationship with girlfriend and plan on where to go until his next check arrived on Dec 1st. Treatment team and patient feel he is safe to discharge with close outpatient follow-up. At this time he is not a danger to himself or others, and has maximized his therapeutic benefit from this hospital stay.   Physical Findings: AIMS:  , ,  ,  ,     CIWA:    COWS:     Musculoskeletal: Strength & Muscle Tone: within normal limits Gait & Station: normal Patient leans: N/A  Psychiatric Specialty Exam: Physical Exam Vitals and nursing note reviewed.  Constitutional:      Appearance: Normal appearance.  HENT:     Head: Normocephalic and atraumatic.     Right Ear: External ear normal.     Left Ear: External ear normal.     Nose: Nose normal.     Mouth/Throat:     Mouth: Mucous membranes are moist.     Pharynx: Oropharynx is clear.  Eyes:     Extraocular Movements: Extraocular movements intact.     Conjunctiva/sclera: Conjunctivae normal.     Pupils: Pupils are equal, round, and reactive to light.  Cardiovascular:     Rate and Rhythm: Normal rate.     Pulses: Normal pulses.  Pulmonary:     Effort: Pulmonary effort is normal.     Breath sounds: Normal breath sounds.  Abdominal:     General: Abdomen is flat.     Palpations: Abdomen is soft.  Musculoskeletal:        General: No swelling. Normal range of motion.     Cervical back: Normal range of motion and neck supple.  Skin:    General: Skin is warm and dry.  Neurological:     General: No focal deficit present.     Mental Status: He is alert and oriented to person, place, and time.  Psychiatric:        Mood and Affect: Mood normal.        Behavior: Behavior normal.        Thought Content: Thought content normal.        Judgment: Judgment normal.     Review of Systems  Constitutional: Negative for activity change and fatigue.  HENT: Negative for rhinorrhea and sore throat.   Eyes: Negative for photophobia and visual disturbance.  Respiratory: Negative for cough and shortness of breath.   Cardiovascular: Negative for chest pain and palpitations.  Gastrointestinal: Negative for constipation, diarrhea, nausea and vomiting.  Endocrine: Negative for cold intolerance and heat intolerance.  Genitourinary: Negative for difficulty urinating and dysuria.  Musculoskeletal:  Negative for arthralgias and myalgias.  Skin: Negative for rash and wound.  Allergic/Immunologic: Negative for food allergies and immunocompromised state.  Neurological: Negative for dizziness and headaches.  Hematological: Negative for adenopathy. Does not bruise/bleed easily.  Psychiatric/Behavioral: Negative for dysphoric mood, sleep disturbance and suicidal ideas. The patient is not nervous/anxious.     Blood pressure 134/77, pulse 60, temperature 98.6 F (37 C), temperature source Oral, resp. rate 16, height 5\' 9"  (1.753 m), weight 82.1 kg, SpO2 96 %.Body mass index is 26.73 kg/m.  General Appearance: Well Groomed  Eye Contact::  Good  Speech:  Clear and Coherent and Normal Rate409  Volume:  Normal  Mood:  Euthymic  Affect:  Congruent  Thought Process:  Coherent and Linear  Orientation:  Full (Time, Place, and Person)  Thought Content:  Logical  Suicidal Thoughts:  No  Homicidal Thoughts:  No  Memory:  Immediate;   Fair Recent;   Fair Remote;   Fair  Judgement:  Intact  Insight:  Fair  Psychomotor Activity:  Normal  Concentration:  Fair  Recall:  [id number] of Knowledge:Fair  Language: Fair  Akathisia:  Negative  Handed:  Right  AIMS (if indicated):     Assets:  Communication Skills Desire for Improvement Financial Resources/Insurance Intimacy Resilience Social Support  Sleep:  Number of Hours: 8.25  Cognition: WNL  ADL's:  Intact           Has this patient used any form of tobacco in the last 30 days? (Cigarettes, Smokeless Tobacco, Cigars, and/or Pipes)  No  Blood Alcohol level:  Lab Results  Component Value Date   ETH <10 10/17/2020   ETH <10 05/15/2020    Metabolic Disorder Labs:  Lab Results  Component Value Date   HGBA1C 5.2 05/27/2018   MPG 102.54 05/27/2018   MPG 114 10/26/2016   No results found for: PROLACTIN Lab Results  Component Value Date   CHOL 158 01/23/2019   TRIG 63 01/23/2019   HDL 43 01/23/2019   CHOLHDL 3.7 01/23/2019    VLDL  13 01/23/2019   LDLCALC 102 (H) 01/23/2019   LDLCALC 67 05/27/2018    See Psychiatric Specialty Exam and Suicide Risk Assessment completed by Attending Physician prior to discharge.  Discharge destination:  Other:  homeless shelter  Is patient on multiple antipsychotic therapies at discharge:  No   Has Patient had three or more failed trials of antipsychotic monotherapy by history:  No  Recommended Plan for Multiple Antipsychotic Therapies: NA  Discharge Instructions    Diet - low sodium heart healthy   Complete by: As directed    Increase activity slowly   Complete by: As directed      Allergies as of 10/28/2020      Reactions   Amlodipine Swelling   Of the tongue   Lisinopril Swelling   Lactose Other (See Comments)   GI distress   Pollen Extract Other (See Comments)   Itchy eyes and runny nose      Medication List    STOP taking these medications   gabapentin 300 MG capsule Commonly known as: NEURONTIN   oxyCODONE 5 MG immediate release tablet Commonly known as: Oxy IR/ROXICODONE   traMADol 50 MG tablet Commonly known as: ULTRAM     TAKE these medications     Indication  albuterol 108 (90 Base) MCG/ACT inhaler Commonly known as: VENTOLIN HFA Inhale 1-2 puffs into the lungs every 4 (four) hours as needed for wheezing or shortness of breath.  Indication: Asthma   atorvastatin 10 MG tablet Commonly known as: LIPITOR Take 1 tablet (10 mg total) by mouth daily at 6 PM.  Indication: High Amount of Fats in the Blood   Biktarvy 50-200-25 MG Tabs tablet Generic drug: bictegravir-emtricitabine-tenofovir AF Take 1 tablet by mouth daily.  Indication: HIV Disease   citalopram 40 MG tablet Commonly known as: CELEXA Take 1 tablet (40 mg total) by mouth daily.  Indication: Depression, Generalized Anxiety Disorder, Mood   hydrOXYzine 50 MG tablet Commonly known as: ATARAX/VISTARIL Take 1 tablet (50 mg total) by mouth 3 (three) times daily as needed.   Indication: Itching   mirtazapine 7.5 MG tablet Commonly known as: REMERON Take 1 tablet (7.5 mg total) by mouth at bedtime.  Indication: Major Depressive Disorder   risperiDONE 1 MG tablet Commonly known as: RISPERDAL Take 1 tablet (1 mg total) by mouth at bedtime.  Indication: Major Depressive Disorder   traZODone 150 MG tablet Commonly known as: DESYREL Take 1 tablet (150 mg total) by mouth at bedtime as needed for sleep. What changed:   medication strength  how much to take  Indication: Trouble Sleeping       Follow-up Information    [facility] Follow up.   Specialty: Addiction Medicine Contact information: [address] [place] [address] [phone]        [facility] .   Contact information: [address] [place] [address] [phone]        [facility] Follow up.   Specialty: [facility] information: [address] [place] [address] [phone]              Follow-up recommendations:  Activity:  as tolerated Diet:  regular diet  Comments:  30-day scripts with one refill sent to [facility] in [place] per patient request.   Signed: Jesse Sans, MD 10/28/2020, 9:49 AM

## 2020-10-28 NOTE — Progress Notes (Signed)
  [facility] Adult Case Management Discharge Plan :  Will you be returning to the same living situation after discharge:  Yes,  Patient declined refferal for [facility], [address], and [employer] At discharge, do you have transportation home?: Yes,  Patient to utilize bus system  Do you have the ability to pay for your medications: Yes,  [facility]  Release of information consent forms completed and in the chart;  [name] signature needed at discharge.  Patient to Follow up at:  Follow-up Information    [facility] Follow up.   Specialty: Addiction Medicine Why: Lewie Loron, on waiting list. Contact information: [address] [place] [address] [phone]        [facility] .   Contact information: [address] [place] [address] [phone]        [facility] Go in 3 day(s).   Why: Appointment scheduled for:  Monday, 31 Oct 2020 @ 1230 Contact information: [address] [place] [address] [phone]               Next level of care provider has access to [facility] Link:no  Safety Planning and Suicide Prevention discussed: Yes,  SPE completed with patient and collateral contact.      Has patient been referred to the Quitline?: Patient refused referral  Patient has been referred for addiction treatment: Yes  Corky Crafts, LCSWA 10/28/2020, 10:52 AM

## 2020-10-28 NOTE — Progress Notes (Signed)
Pt denies SI, HI and AVH. Pt was educated on dc plan and verbalizes understanding. Pt was given dc packet and belongings. Torrie Mayers RN

## 2020-10-28 NOTE — Progress Notes (Signed)
Recreation Therapy Notes   Date: 10/28/2020  Time: 9:30 am              Location: Craft room  Behavioral response: Appropriate  Intervention Topic: Coping Skills   Discussion/Intervention:  Group content on today was focused on coping skills. The group defined what coping skills are and when they normally use coping skills. Individuals described how they normally cope with thing and the coping skills they normally use. Patients expressed why it is important to cope with things and how not coping with things can affect you. The group participated in the intervention "My coping box" and made coping boxes while adding coping skills they could use in the future to the box. Clinical Observations/Feedback: Patient came to group late and was focused on what peers and staff had to say about coping skills.  Individual was social with peers and staff while participating in the intervention.  Renise Gillies LRT/CTRS          Nevia Henkin 10/28/2020 12:48 PM

## 2020-10-28 NOTE — BHH Suicide Risk Assessment (Signed)
[facility] Discharge Suicide Risk Assessment   Principal Problem: Severe recurrent major depression without psychotic features [facility]) Discharge Diagnoses: Principal Problem:   Severe recurrent major depression without psychotic features (HCC) Active Problems:   HIV disease (HCC)   HTN (hypertension)   Cocaine dependence (HCC)   GERD (gastroesophageal reflux disease)   Lower urinary tract infection   Total Time spent with patient: 30 minutes  Musculoskeletal: Strength & Muscle Tone: within normal limits Gait & Station: normal Patient leans: N/A  Psychiatric Specialty Exam: Review of Systems  Constitutional: Negative for activity change and fatigue.  HENT: Negative for rhinorrhea and sore throat.   Eyes: Negative for photophobia and visual disturbance.  Respiratory: Negative for cough and shortness of breath.   Cardiovascular: Negative for chest pain and palpitations.  Gastrointestinal: Negative for constipation, diarrhea, nausea and vomiting.  Endocrine: Negative for cold intolerance and heat intolerance.  Genitourinary: Negative for difficulty urinating and dysuria.  Musculoskeletal: Negative for arthralgias and myalgias.  Skin: Negative for rash and wound.  Allergic/Immunologic: Negative for food allergies and immunocompromised state.  Neurological: Negative for dizziness and headaches.  Hematological: Negative for adenopathy. Does not bruise/bleed easily.  Psychiatric/Behavioral: Negative for dysphoric mood, sleep disturbance and suicidal ideas. The patient is not nervous/anxious.     Blood pressure 134/77, pulse 60, temperature 98.6 F (37 C), temperature source Oral, resp. rate 16, height 5\' 9"  (1.753 m), weight 82.1 kg, SpO2 96 %.Body mass index is 26.73 kg/m.  General Appearance: Well Groomed  Eye Contact::  Good  Speech:  Clear and Coherent and Normal Rate409  Volume:  Normal  Mood:  Euthymic  Affect:  Congruent  Thought Process:  Coherent and Linear  Orientation:  Full  (Time, Place, and Person)  Thought Content:  Logical  Suicidal Thoughts:  No  Homicidal Thoughts:  No  Memory:  Immediate;   Fair Recent;   Fair Remote;   Fair  Judgement:  Intact  Insight:  Fair  Psychomotor Activity:  Normal  Concentration:  Fair  Recall:  002.002.002.002 of Knowledge:Fair  Language: Fair  Akathisia:  Negative  Handed:  Right  AIMS (if indicated):     Assets:  Communication Skills Desire for Improvement Financial Resources/Insurance Intimacy Resilience Social Support  Sleep:  Number of Hours: 8.25  Cognition: WNL  ADL's:  Intact   Mental Status Per Nursing Assessment::   On Admission:  Self-harm thoughts, Suicidal ideation indicated by others, Suicidal ideation indicated by patient, Suicide plan  Demographic Factors:  Male  Loss Factors: Legal issues  Historical Factors: NA  Risk Reduction Factors:   Sense of responsibility to family, Positive social support, Positive therapeutic relationship and Positive coping skills or problem solving skills  Continued Clinical Symptoms:  Depression:   Insomnia Alcohol/Substance Abuse/Dependencies Previous Psychiatric Diagnoses and Treatments  Cognitive Features That Contribute To Risk:  None    Suicide Risk:  Minimal: No identifiable suicidal ideation.  Patients presenting with no risk factors but with morbid ruminations; may be classified as minimal risk based on the severity of the depressive symptoms   Follow-up Information    [facility] Follow up.   Specialty: Addiction Medicine Contact information: [address] [place] [phone]        [facility] .   Contact information: [address] [place] [phone]        [facility] Follow up.   Specialty: Behavioral Health Contact  information: [address] [place] [address] [phone]               Plan Of Care/Follow-up recommendations:  Activity:  as tolerated Diet:  regular diet  Jesse Sans, MD 10/28/2020, 9:45 AM

## 2020-10-28 NOTE — Progress Notes (Signed)
Recreation Therapy Notes  INPATIENT RECREATION TR PLAN  Patient Details Name: Roy [name]. MRN: [id number] DOB: 20-Sep-1966 Today's Date: 10/28/2020  Rec Therapy Plan Is patient appropriate for Therapeutic Recreation?: Yes Treatment times per week: at least 3 Estimated Length of Stay: 5-7 days TR Treatment/Interventions: Group participation (Comment)  Discharge Criteria Pt will be discharged from therapy if:: Discharged Treatment plan/goals/alternatives discussed and agreed upon by:: Patient/family  Discharge Summary Short term goals set: Patient will engage in groups without prompting or encouragement from LRT x3 group sessions within 5 recreation therapy group sessions Short term goals met: Complete Progress toward goals comments: Groups attended Which groups?: Coping skills, Communication, Other (Comment) (Relaxation) Reason goals not met: N/A Therapeutic equipment acquired: N/A Reason patient discharged from therapy: Discharge from hospital Pt/family agrees with progress & goals achieved: Yes Date patient discharged from therapy: 10/28/20   Tanara Turvey 10/28/2020, 12:52 PM

## 2020-10-31 ENCOUNTER — Other Ambulatory Visit: Payer: Self-pay

## 2020-10-31 ENCOUNTER — Emergency Department
Admission: EM | Admit: 2020-10-31 | Discharge: 2020-11-01 | Disposition: A | Discharge status: Other Acute Inpatient Hospital | Payer: Medicaid Other | Attending: Emergency Medicine | Admitting: Emergency Medicine

## 2020-10-31 ENCOUNTER — Encounter: Payer: Self-pay | Admitting: Emergency Medicine

## 2020-10-31 ENCOUNTER — Emergency Department: Payer: Medicaid Other

## 2020-10-31 DIAGNOSIS — R45851 Suicidal ideations: Secondary | ICD-10-CM

## 2020-10-31 DIAGNOSIS — Z20822 Contact with and (suspected) exposure to covid-19: Secondary | ICD-10-CM | POA: Insufficient documentation

## 2020-10-31 DIAGNOSIS — F319 Bipolar disorder, unspecified: Secondary | ICD-10-CM | POA: Diagnosis not present

## 2020-10-31 DIAGNOSIS — F1420 Cocaine dependence, uncomplicated: Secondary | ICD-10-CM | POA: Diagnosis present

## 2020-10-31 DIAGNOSIS — J45909 Unspecified asthma, uncomplicated: Secondary | ICD-10-CM | POA: Diagnosis not present

## 2020-10-31 DIAGNOSIS — I119 Hypertensive heart disease without heart failure: Secondary | ICD-10-CM | POA: Diagnosis not present

## 2020-10-31 DIAGNOSIS — F1010 Alcohol abuse, uncomplicated: Secondary | ICD-10-CM

## 2020-10-31 DIAGNOSIS — I2510 Atherosclerotic heart disease of native coronary artery without angina pectoris: Secondary | ICD-10-CM | POA: Diagnosis not present

## 2020-10-31 DIAGNOSIS — F1994 Other psychoactive substance use, unspecified with psychoactive substance-induced mood disorder: Secondary | ICD-10-CM | POA: Diagnosis present

## 2020-10-31 DIAGNOSIS — I10 Essential (primary) hypertension: Secondary | ICD-10-CM | POA: Diagnosis present

## 2020-10-31 DIAGNOSIS — F1410 Cocaine abuse, uncomplicated: Secondary | ICD-10-CM

## 2020-10-31 DIAGNOSIS — Z96641 Presence of right artificial hip joint: Secondary | ICD-10-CM | POA: Insufficient documentation

## 2020-10-31 DIAGNOSIS — R110 Nausea: Secondary | ICD-10-CM | POA: Insufficient documentation

## 2020-10-31 DIAGNOSIS — F332 Major depressive disorder, recurrent severe without psychotic features: Secondary | ICD-10-CM | POA: Diagnosis present

## 2020-10-31 DIAGNOSIS — F17210 Nicotine dependence, cigarettes, uncomplicated: Secondary | ICD-10-CM | POA: Insufficient documentation

## 2020-10-31 DIAGNOSIS — E785 Hyperlipidemia, unspecified: Secondary | ICD-10-CM | POA: Diagnosis present

## 2020-10-31 DIAGNOSIS — B20 Human immunodeficiency virus [HIV] disease: Secondary | ICD-10-CM | POA: Diagnosis present

## 2020-10-31 LAB — URINE DRUG SCREEN, QUALITATIVE (ARMC ONLY)
Amphetamines, Ur Screen: NOT DETECTED
Barbiturates, Ur Screen: NOT DETECTED
Benzodiazepine, Ur Scrn: NOT DETECTED
Cannabinoid 50 Ng, Ur ~~LOC~~: NOT DETECTED
Cocaine Metabolite,Ur ~~LOC~~: POSITIVE — AB
MDMA (Ecstasy)Ur Screen: NOT DETECTED
Methadone Scn, Ur: NOT DETECTED
Opiate, Ur Screen: NOT DETECTED
Phencyclidine (PCP) Ur S: NOT DETECTED
Tricyclic, Ur Screen: NOT DETECTED

## 2020-10-31 LAB — COMPREHENSIVE METABOLIC PANEL
ALT: 20 U/L (ref 0–44)
AST: 21 U/L (ref 15–41)
Albumin: 4.3 g/dL (ref 3.5–5.0)
Alkaline Phosphatase: 81 U/L (ref 38–126)
Anion gap: 13 (ref 5–15)
BUN: 20 mg/dL (ref 6–20)
CO2: 22 mmol/L (ref 22–32)
Calcium: 8.9 mg/dL (ref 8.9–10.3)
Chloride: 101 mmol/L (ref 98–111)
Creatinine, Ser: 1.07 mg/dL (ref 0.61–1.24)
GFR, Estimated: >60 mL/min (ref >60)
Glucose, Bld: 98 mg/dL (ref 70–99)
Potassium: 4.2 mmol/L (ref 3.5–5.1)
Sodium: 136 mmol/L (ref 135–145)
Total Bilirubin: 0.9 mg/dL (ref 0.3–1.2)
Total Protein: 8.2 g/dL — ABNORMAL HIGH (ref 6.5–8.1)

## 2020-10-31 LAB — ETHANOL: Alcohol, Ethyl (B): <10 mg/dL (ref <10)

## 2020-10-31 LAB — TROPONIN I (HIGH SENSITIVITY): Troponin I (High Sensitivity): 12 ng/L (ref <18)

## 2020-10-31 LAB — CBC
HCT: 41.1 % (ref 39.0–52.0)
Hemoglobin: 14 g/dL (ref 13.0–17.0)
MCH: 32.2 pg (ref 26.0–34.0)
MCHC: 34.1 g/dL (ref 30.0–36.0)
MCV: 94.5 fL (ref 80.0–100.0)
Platelets: 264 10*3/uL (ref 150–400)
RBC: 4.35 MIL/uL (ref 4.22–5.81)
RDW: 14 % (ref 11.5–15.5)
WBC: 8.1 10*3/uL (ref 4.0–10.5)
nRBC: 0 % (ref 0.0–0.2)

## 2020-10-31 LAB — ACETAMINOPHEN LEVEL: Acetaminophen (Tylenol), Serum: <10 ug/mL — ABNORMAL LOW (ref 10–30)

## 2020-10-31 LAB — SALICYLATE LEVEL: Salicylate Lvl: <7 mg/dL — ABNORMAL LOW (ref 7.0–30.0)

## 2020-10-31 MED ORDER — ACETAMINOPHEN 500 MG PO TABS
1000.0000 mg | ORAL_TABLET | Freq: Once | ORAL | Status: AC
  Administered 2020-11-01: 1000 mg via ORAL
  Filled 2020-10-31: qty 2

## 2020-10-31 MED ORDER — ONDANSETRON 4 MG PO TBDP
4.0000 mg | ORAL_TABLET | Freq: Once | ORAL | Status: AC
  Administered 2020-11-01: 4 mg via ORAL
  Filled 2020-10-31: qty 1

## 2020-10-31 MED ORDER — ONDANSETRON 4 MG PO TBDP
4.0000 mg | ORAL_TABLET | Freq: Once | ORAL | Status: AC
  Administered 2020-10-31: 4 mg via ORAL
  Filled 2020-10-31: qty 1

## 2020-10-31 NOTE — ED Notes (Signed)
Pt reports nausea. No vomiting at this time.

## 2020-10-31 NOTE — ED Notes (Signed)
Pt up to restroom with steady gait. Pt c/o nausea. MD aware. Awaiting on orders.

## 2020-10-31 NOTE — ED Notes (Signed)
Dr. Veronese at the bedside for pt evaluation.  

## 2020-10-31 NOTE — ED Triage Notes (Signed)
Pt to ED, homeless, brought in voluntarily by police c/o SI.  States recently here for same and needing placement but released d/t no openings.  Pt reports using crack cocaine approx 20 min PTA and 2 40oz beers today.  Pt calm and cooperative in triage.  States thought about stepping into traffic.  States wanting help.  Ambulatory with steady gait, chest rise even and unlabored, in NAD at this time.  Pt dressed into hospital appropriate scrubs by this RN and Mayra, EDT.  Belongings placed into 3 bags.  Contents include: 1 pair black shoes, 1 pair white socks, 1 black hat, 1 black and white t shirt, 1 brown jacket, 1 brown belt, 1 pair blue jeans, 1 blue bag with clothing, 1 white phone charger.

## 2020-11-01 ENCOUNTER — Encounter: Payer: Self-pay | Admitting: Psychiatry

## 2020-11-01 ENCOUNTER — Inpatient Hospital Stay
Admission: AD | Admit: 2020-11-01 | Discharge: 2020-11-02 | DRG: 885 | Disposition: A | Discharge status: Home / Self Care | Payer: Medicaid Other | Source: Intra-hospital | Attending: Behavioral Health | Admitting: Behavioral Health

## 2020-11-01 DIAGNOSIS — F411 Generalized anxiety disorder: Secondary | ICD-10-CM | POA: Diagnosis present

## 2020-11-01 DIAGNOSIS — F17210 Nicotine dependence, cigarettes, uncomplicated: Secondary | ICD-10-CM | POA: Diagnosis present

## 2020-11-01 DIAGNOSIS — I10 Essential (primary) hypertension: Secondary | ICD-10-CM | POA: Diagnosis present

## 2020-11-01 DIAGNOSIS — F319 Bipolar disorder, unspecified: Secondary | ICD-10-CM | POA: Diagnosis not present

## 2020-11-01 DIAGNOSIS — G4700 Insomnia, unspecified: Secondary | ICD-10-CM | POA: Diagnosis present

## 2020-11-01 DIAGNOSIS — B20 Human immunodeficiency virus [HIV] disease: Secondary | ICD-10-CM | POA: Diagnosis present

## 2020-11-01 DIAGNOSIS — Z20822 Contact with and (suspected) exposure to covid-19: Secondary | ICD-10-CM | POA: Diagnosis present

## 2020-11-01 DIAGNOSIS — F332 Major depressive disorder, recurrent severe without psychotic features: Secondary | ICD-10-CM | POA: Diagnosis present

## 2020-11-01 DIAGNOSIS — R45851 Suicidal ideations: Secondary | ICD-10-CM | POA: Diagnosis present

## 2020-11-01 DIAGNOSIS — F1420 Cocaine dependence, uncomplicated: Secondary | ICD-10-CM | POA: Diagnosis present

## 2020-11-01 DIAGNOSIS — Z5900 Homelessness unspecified: Secondary | ICD-10-CM | POA: Diagnosis not present

## 2020-11-01 DIAGNOSIS — J309 Allergic rhinitis, unspecified: Secondary | ICD-10-CM | POA: Diagnosis present

## 2020-11-01 DIAGNOSIS — J45909 Unspecified asthma, uncomplicated: Secondary | ICD-10-CM | POA: Diagnosis present

## 2020-11-01 LAB — RESP PANEL BY RT PCR (RSV, FLU A&B, COVID)
Influenza A by PCR: NEGATIVE
Influenza B by PCR: NEGATIVE
Respiratory Syncytial Virus by PCR: NEGATIVE
SARS Coronavirus 2 by RT PCR: NEGATIVE

## 2020-11-01 MED ORDER — ALBUTEROL SULFATE HFA 108 (90 BASE) MCG/ACT IN AERS
1.0000 | INHALATION_SPRAY | RESPIRATORY_TRACT | Status: DC | PRN
  Filled 2020-11-01: qty 6.7

## 2020-11-01 MED ORDER — CITALOPRAM HYDROBROMIDE 20 MG PO TABS
40.0000 mg | ORAL_TABLET | Freq: Every day | ORAL | Status: DC
  Administered 2020-11-01 – 2020-11-02 (×2): 40 mg via ORAL
  Filled 2020-11-01 (×2): qty 2

## 2020-11-01 MED ORDER — OXYMETAZOLINE HCL 0.05 % NA SOLN: 1.0000 | Freq: Two times a day (BID) | NASAL | Status: DC

## 2020-11-01 MED ORDER — ATORVASTATIN CALCIUM 20 MG PO TABS
10.0000 mg | ORAL_TABLET | Freq: Every day | ORAL | Status: DC
  Administered 2020-11-01: 10 mg via ORAL
  Filled 2020-11-01: qty 1

## 2020-11-01 MED ORDER — MAGNESIUM HYDROXIDE 400 MG/5ML PO SUSP: 30.0000 mL | Freq: Every day | ORAL | Status: DC | PRN

## 2020-11-01 MED ORDER — ENSURE MAX PROTEIN PO LIQD
11.0000 [oz_av] | Freq: Two times a day (BID) | ORAL | Status: DC
  Administered 2020-11-01 – 2020-11-02 (×2): 11 [oz_av] via ORAL
  Filled 2020-11-01: qty 330

## 2020-11-01 MED ORDER — RISPERIDONE 1 MG PO TABS: 1.0000 mg | ORAL_TABLET | Freq: Every day | ORAL | Status: DC

## 2020-11-01 MED ORDER — OXYMETAZOLINE HCL 0.05 % NA SOLN
1.0000 | Freq: Two times a day (BID) | NASAL | Status: DC
  Administered 2020-11-01 – 2020-11-02 (×2): 1 via NASAL
  Filled 2020-11-01: qty 15

## 2020-11-01 MED ORDER — ALUM & MAG HYDROXIDE-SIMETH 200-200-20 MG/5ML PO SUSP: 30.0000 mL | ORAL | Status: DC | PRN

## 2020-11-01 MED ORDER — RISPERIDONE 1 MG PO TABS
1.0000 mg | ORAL_TABLET | Freq: Every day | ORAL | Status: DC
  Administered 2020-11-01: 1 mg via ORAL
  Filled 2020-11-01: qty 1

## 2020-11-01 MED ORDER — HYDROXYZINE HCL 50 MG PO TABS
50.0000 mg | ORAL_TABLET | Freq: Three times a day (TID) | ORAL | Status: DC | PRN
  Administered 2020-11-02: 50 mg via ORAL
  Filled 2020-11-01: qty 1

## 2020-11-01 MED ORDER — TRAZODONE HCL 50 MG PO TABS
150.0000 mg | ORAL_TABLET | Freq: Every evening | ORAL | Status: DC | PRN
  Administered 2020-11-01: 150 mg via ORAL
  Filled 2020-11-01: qty 3

## 2020-11-01 MED ORDER — ACETAMINOPHEN 325 MG PO TABS
650.0000 mg | ORAL_TABLET | Freq: Four times a day (QID) | ORAL | Status: DC | PRN
  Administered 2020-11-02: 650 mg via ORAL
  Filled 2020-11-01: qty 2

## 2020-11-01 MED ORDER — MIRTAZAPINE 15 MG PO TABS
7.5000 mg | ORAL_TABLET | Freq: Every day | ORAL | Status: DC
  Administered 2020-11-01: 7.5 mg via ORAL
  Filled 2020-11-01: qty 1

## 2020-11-01 MED ORDER — CITALOPRAM HYDROBROMIDE 20 MG PO TABS
40.0000 mg | ORAL_TABLET | Freq: Every day | ORAL | Status: DC
  Administered 2020-11-01: 40 mg via ORAL
  Filled 2020-11-01: qty 2

## 2020-11-01 MED ORDER — BICTEGRAVIR-EMTRICITAB-TENOFOV 50-200-25 MG PO TABS
1.0000 | ORAL_TABLET | Freq: Every day | ORAL | Status: DC
  Administered 2020-11-01 – 2020-11-02 (×2): 1 via ORAL
  Filled 2020-11-01 (×3): qty 1

## 2020-11-01 MED ORDER — BICTEGRAVIR-EMTRICITAB-TENOFOV 50-200-25 MG PO TABS
1.0000 | ORAL_TABLET | Freq: Every day | ORAL | Status: DC
  Administered 2020-11-01: 1 via ORAL
  Filled 2020-11-01: qty 1

## 2020-11-01 MED ORDER — TRAZODONE HCL 50 MG PO TABS: 150.0000 mg | ORAL_TABLET | Freq: Every evening | ORAL | Status: DC | PRN

## 2020-11-01 MED ORDER — SALINE SPRAY 0.65 % NA SOLN
1.0000 | NASAL | Status: DC | PRN
  Filled 2020-11-01: qty 44

## 2020-11-01 NOTE — ED Notes (Addendum)
Pt transferred into ED BHU room 1   Patient assigned to appropriate care area. Patient oriented to unit/care area: Informed that, for his safety, care areas are designed for safety and monitored by security cameras at all times; Visiting hours and phone times explained to patient. Patient verbalizes understanding, and verbal contract for safety obtained.   Assessment completed  He denies pain   

## 2020-11-01 NOTE — BHH Suicide Risk Assessment (Signed)
[facility] Admission Suicide Risk Assessment   Nursing information obtained from:    Demographic factors:    Current Mental Status:    Loss Factors:    Historical Factors:    Risk Reduction Factors:     Total Time spent with patient: 45 minutes Principal Problem: Severe recurrent major depression without psychotic features (HCC) Diagnosis:  Principal Problem:   Severe recurrent major depression without psychotic features (HCC) Active Problems:   HIV disease (HCC)   Cocaine dependence (HCC)   Allergic rhinitis  Subjective Data: Patient seen today at bedside. He states after discharge he did not pick up his medications due to lack of funds. He stayed with his girlfriend Saturday and Sunday, but had to leave on Monday because she is in section 8 housing. After this he went back to old neighborhood, and quickly began using crack cocaine. He states he has been stealing meat and other food to sell for money. He notes that he has also bought crack on credit, and is afraid he will be killed due to inability to bay back dealers with social security income. He is also concerned about his upcoming court date on Nov 29th for misdemeanor larceny from stealing food from [employer]. He knows he is facing misdemeanor habitual and potential for 6-10 years jail time he does not believe he will survive. He is hopeful to get into treatment for substance abuse, and "stay stopped for good" from drugs. He notes it is causing serious issues in his life including discord with girlfriend, health consequences, and legal consequences. Today he notes he has fleeting thoughts of suicide, but does not have plan at this time. He notes he feels safe in the hospital, and able to resist urges. He denies homicidal ideation, visual hallucinations, and auditory hallucinations.   Continued Clinical Symptoms:  Alcohol Use Disorder Identification Test Final Score (AUDIT): 4 The "Alcohol Use Disorders Identification Test", Guidelines for Use in  Primary Care, Second Edition.  World Science writer [facility]). Score between 0-7:  no or low risk or alcohol related problems. Score between 8-15:  moderate risk of alcohol related problems. Score between 16-19:  high risk of alcohol related problems. Score 20 or above:  warrants further diagnostic evaluation for alcohol dependence and treatment.   CLINICAL FACTORS:   Depression:   Comorbid alcohol abuse/dependence Hopelessness Insomnia Severe Alcohol/Substance Abuse/Dependencies Unstable or Poor Therapeutic Relationship Previous Psychiatric Diagnoses and Treatments Medical Diagnoses and Treatments/Surgeries   Musculoskeletal: Strength & Muscle Tone: within normal limits Gait & Station: normal Patient leans: N/A  Psychiatric Specialty Exam: Physical Exam Vitals and nursing note reviewed.  Constitutional:      Appearance: Normal appearance.  HENT:     Head: Normocephalic and atraumatic.     Right Ear: External ear normal.     Left Ear: External ear normal.     Nose: Nose normal.     Mouth/Throat:     Mouth: Mucous membranes are moist.     Pharynx: Oropharynx is clear.  Eyes:     Extraocular Movements: Extraocular movements intact.     Conjunctiva/sclera: Conjunctivae normal.     Pupils: Pupils are equal, round, and reactive to light.  Cardiovascular:     Rate and Rhythm: Normal rate.     Pulses: Normal pulses.  Pulmonary:     Effort: Pulmonary effort is normal.     Breath sounds: Normal breath sounds.  Abdominal:     General: Abdomen is flat.     Palpations: Abdomen is soft.  Musculoskeletal:        General: No swelling. Normal range of motion.     Cervical back: Normal range of motion and neck supple.  Skin:    General: Skin is warm and dry.  Neurological:     General: No focal deficit present.     Mental Status: He is alert and oriented to person, place, and time.  Psychiatric:        Attention and Perception: Attention and perception normal.        Mood  and Affect: Mood is depressed.        Speech: Speech normal.        Behavior: Behavior is withdrawn.        Thought Content: Thought content includes suicidal ideation.        Cognition and Memory: Cognition and memory normal.        Judgment: Judgment is impulsive.     Review of Systems  Constitutional: Positive for appetite change and fatigue.  HENT: Positive for sinus pressure. Negative for sore throat.   Eyes: Negative for photophobia and visual disturbance.  Respiratory: Negative for cough and shortness of breath.   Cardiovascular: Negative for chest pain and palpitations.  Gastrointestinal: Negative for constipation, diarrhea, nausea and vomiting.  Endocrine: Negative for cold intolerance and heat intolerance.  Genitourinary: Negative for difficulty urinating and dysuria.  Musculoskeletal: Negative for arthralgias and back pain.  Skin: Negative for rash and wound.  Allergic/Immunologic: Negative for environmental allergies and immunocompromised state.  Neurological: Negative for dizziness and headaches.  Hematological: Negative for adenopathy. Does not bruise/bleed easily.  Psychiatric/Behavioral: Positive for dysphoric mood, sleep disturbance and suicidal ideas.    Blood pressure (!) 130/92, temperature 99.2 F (37.3 C), temperature source Oral, height 5\' 9"  (1.753 m), SpO2 100 %.Body mass index is 28.21 kg/m.  General Appearance: Well Groomed  Eye Contact:  Good  Speech:  Clear and Coherent  Volume:  Normal  Mood:  Depressed  Affect:  Congruent  Thought Process:  Coherent and Linear  Orientation:  Full (Time, Place, and Person)  Thought Content:  Logical  Suicidal Thoughts:  Yes.  without intent/plan  Homicidal Thoughts:  No  Memory:  Immediate;   Fair Recent;   Fair Remote;   Fair  Judgement:  Fair  Insight:  Fair  Psychomotor Activity:  Normal  Concentration:  Concentration: Fair and Attention Span: Fair  Recall:  of Knowledge:  Fair  Language:   Fair  Akathisia:  Negative  Handed:  Right  AIMS (if indicated):     Assets:  Communication Skills Desire for Improvement Financial Resources/Insurance Intimacy Leisure Time Social Support  ADL's:  Intact  Cognition:  WNL  Sleep:         COGNITIVE FEATURES THAT CONTRIBUTE TO RISK:  Polarized thinking    SUICIDE RISK:   Moderate:  Frequent suicidal ideation with limited intensity, and duration, some specificity in terms of plans, no associated intent, good self-control, limited dysphoria/symptomatology, some risk factors present, and identifiable protective factors, including available and accessible social support.  PLAN OF CARE: Continue inpatient admission. Allow patient to sign in voluntarily. Will resume medications he was on during last admission (Celexa 40 mg, Remeron 7.5 mg, Risperdal 1 mg QHS, Trazodone 150 mg QHS PRN. Encourage participation in group/mileui activities. Will search for substance abuse treatment options with social work.   I certify that inpatient services furnished can reasonably be expected to improve the patient's condition.   [employer]  Neale Burly, MD 11/01/2020, 3:04 PM

## 2020-11-01 NOTE — BHH Group Notes (Signed)
LCSW Group Therapy Note  11/01/2020 2:03 PM  Type of Therapy/Topic:  Group Therapy:  Feelings about Diagnosis  Participation Level:  Did Not Attend   Description of Group:   This group will allow patients to explore their thoughts and feelings about diagnoses they have received. Patients will be guided to explore their level of understanding and acceptance of these diagnoses. Facilitator will encourage patients to process their thoughts and feelings about the reactions of others to their diagnosis and will guide patients in identifying ways to discuss their diagnosis with significant others in their lives. This group will be process-oriented, with patients participating in exploration of their own experiences, giving and receiving support, and processing challenge from other group members.   Therapeutic Goals: 1. Patient will demonstrate understanding of diagnosis as evidenced by identifying two or more symptoms of the disorder 2. Patient will be able to express two feelings regarding the diagnosis 3. Patient will demonstrate their ability to communicate their needs through discussion and/or role play  Summary of Patient Progress: X  Therapeutic Modalities:   Cognitive Behavioral Therapy Brief Therapy Feelings Identification   Markisha Meding R. Algis Greenhouse, MSW, LCSW, LCAS 11/01/2020 2:03 PM

## 2020-11-01 NOTE — Tx Team (Signed)
Initial Treatment Plan 11/01/2020 3:56 PM [name]. [id number]    PATIENT STRESSORS: Health problems Medication change or noncompliance Substance abuse   PATIENT STRENGTHS: Ability for insight Average or above average intelligence Communication skills Motivation for treatment/growth   PATIENT IDENTIFIED PROBLEMS: Substance abuse   Depression   Anxiety                  DISCHARGE CRITERIA:  Ability to meet basic life and health needs Adequate post-discharge living arrangements Improved stabilization in mood, thinking, and/or behavior Verbal commitment to aftercare and medication compliance  PRELIMINARY DISCHARGE PLAN: Attend aftercare/continuing care group Outpatient therapy Placement in alternative living arrangements  PATIENT/FAMILY INVOLVEMENT: This treatment plan has been presented to and reviewed with the patient, Roy [name].The patient  has been given the opportunity to ask questions and make suggestions.  Chalmers Cater, RN 11/01/2020, 3:56 PM

## 2020-11-01 NOTE — ED Notes (Signed)
Pt ambulatory to restroom. NAD noted at this time.

## 2020-11-01 NOTE — ED Notes (Signed)
Pt given crackers and peanut butter for a snack after asking staff for food. Tolerated well.

## 2020-11-01 NOTE — ED Provider Notes (Signed)
[facility] Emergency Department Provider Note  ____________________________________________  Time seen: Approximately 1:17 AM  I have reviewed the triage vital signs and the nursing notes.   HISTORY  Chief Complaint Suicidal   HPI Roy Werntz Sr. is a 54 y.o. male with history of HIV, hypertension, cocaine dependence, depression who is brought in by police voluntarily for depression.  Patient was discharged from our psychiatric department 4 days ago.  Patient is homeless.  Patient drinks and uses crack cocaine.  Last use of cocaine was 20 minutes prior to arrival.  Last alcohol as well 20 minutes prior to arrival.  Patient endorses having to x 40 ounce beers today.  Patient reports that he feels hopeless and very depressed.  He called 911 tonight. He has been having suicidal thoughts with a plan of stepping in front of incoming traffic.  Patient is able to contract for safety and tells me that he feels very safe in here and does not feel suicidal at all when he is in the hospital.  He is complaining of nausea but denies chest pain, shortness of breath, abdominal pain, vomiting.  Past Medical History:  Diagnosis Date  . AIDS (acquired immune deficiency syndrome) (HCC)   . Arthritis   . Asthma   . Bipolar disorder (HCC)   . Bronchitis   . Complication of anesthesia   . Coronary artery disease   . Depression   . Dysrhythmia    1st degree heart block/ brady  . GERD (gastroesophageal reflux disease)   . Hepatitis C    treated  . HIV (human immunodeficiency virus infection) (HCC)   . HTN (hypertension)   . PONV (postoperative nausea and vomiting)     Patient Active Problem List   Diagnosis Date Noted  . Severe recurrent major depression without psychotic features (HCC) 10/18/2020  . Allergic rhinitis 06/21/2020  . GERD (gastroesophageal reflux disease) 06/21/2020  . 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  . Bipolar disorder (manic depression) (HCC)  05/18/2020  . Adjustment disorder with mixed anxiety and depressed mood 05/18/2020  . Arthritis pain, hip 05/18/2020  . 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 Angioedema 07/24/2019  . MDD (major depressive disorder), recurrent episode (HCC) 03/23/2019  . MDD (major depressive disorder), recurrent episode, moderate (HCC) 02/19/2019  . MDD (major depressive disorder), recurrent severe, without psychosis (HCC) 02/19/2019  . Chest pain 05/27/2018  . Major depressive disorder, recurrent severe without psychotic features (HCC) 05/27/2018  . ARF (acute renal failure) (HCC) 04/08/2018  . Malingering 03/07/2017  . Cocaine dependence (HCC) 02/19/2017  . Substance induced mood disorder (HCC) 02/19/2017  . Alcohol use disorder, severe, dependence (HCC) 12/27/2016  . HIV disease (HCC) 10/26/2016  . HTN (hypertension) 10/26/2016  . Dyslipidemia 10/26/2016  . BPH (benign prostatic hyperplasia) 10/26/2016  . Constipation 10/26/2016  . Acquired hallux rigidus of right foot 07/16/2016  . Arthritis 10/25/2014  . Genital warts 08/25/2013    Past Surgical History:  Procedure Laterality Date  . APPLICATION OF WOUND VAC Right 09/01/2019   Procedure: APPLICATION OF WOUND VAC;  Surgeon: Kennedy Bucker, MD;  Location: [facility] ORS;  Service: Orthopedics;  Laterality: Right;  Serial # [id number]  . HERNIA REPAIR Left    inguinal  . TOE SURGERY    . TOE SURGERY Right   . TOTAL HIP ARTHROPLASTY Right 09/01/2019   Procedure: TOTAL HIP ARTHROPLASTY ANTERIOR APPROACH;  Surgeon: Kennedy Bucker, MD;  Location: [facility] ORS;  Service: Orthopedics;  Laterality: Right;  Prior to Admission medications   Medication Sig Start Date End Date Taking? Authorizing Provider  albuterol (VENTOLIN HFA) 108 (90 Base) MCG/ACT inhaler Inhale 1-2 puffs into the lungs every 4 (four) hours as needed for wheezing or shortness of breath. 05/23/20   Clapacs, Jackquline Denmark, MD  atorvastatin (LIPITOR) 10 MG tablet Take 1 tablet (10 mg  total) by mouth daily at 6 PM. Patient not taking: Reported on 07/01/2020 05/23/20   Clapacs, Jackquline Denmark, MD  bictegravir-emtricitabine-tenofovir AF (BIKTARVY) 50-200-25 MG TABS tablet Take 1 tablet by mouth daily. Patient not taking: Reported on 07/01/2020 05/24/20   Clapacs, Jackquline Denmark, MD  citalopram (CELEXA) 40 MG tablet Take 1 tablet (40 mg total) by mouth daily. 10/27/20   Clapacs, Jackquline Denmark, MD  hydrOXYzine (ATARAX/VISTARIL) 50 MG tablet Take 1 tablet (50 mg total) by mouth 3 (three) times daily as needed. Patient not taking: Reported on 07/01/2020 06/17/20   Joni Reining, PA-C  mirtazapine (REMERON) 7.5 MG tablet Take 1 tablet (7.5 mg total) by mouth at bedtime. 10/27/20   Clapacs, Jackquline Denmark, MD  risperiDONE (RISPERDAL) 1 MG tablet Take 1 tablet (1 mg total) by mouth at bedtime. 10/27/20   Clapacs, Jackquline Denmark, MD  trazodone (DESYREL) 150 MG tablet Take 1 tablet (150 mg total) by mouth at bedtime as needed for sleep. 10/27/20   Clapacs, Jackquline Denmark, MD    Allergies Amlodipine, Lisinopril, Lactose, and Pollen extract  Family History  Problem Relation Age of Onset  . Cancer Brother   . Uterine cancer Mother   . CAD Mother   . Hypertension Mother   . Hyperlipidemia Mother     Social History Social History   Tobacco Use  . Smoking status: Current Every Day Smoker    Packs/day: 0.25    Types: Cigarettes  . Smokeless tobacco: Never Used  Vaping Use  . Vaping Use: Never used  Substance Use Topics  . Alcohol use: Yes    Alcohol/week: 84.0 standard drinks    Types: 84 Cans of beer per week    Comment: daily  . Drug use: Yes    Types: Cocaine, "Crack" cocaine    Comment: states last use 10/31/2020    Review of Systems  Constitutional: Negative for fever. Eyes: Negative for visual changes. ENT: Negative for sore throat. Neck: No neck pain  Cardiovascular: Negative for chest pain. Respiratory: Negative for shortness of breath. Gastrointestinal: Negative for abdominal pain, vomiting or diarrhea. +  nausea Genitourinary: Negative for dysuria. Musculoskeletal: Negative for back pain. Skin: Negative for rash. Neurological: Negative for headaches, weakness or numbness. Psych: + depression and SI. no HI  ____________________________________________   PHYSICAL EXAM:  VITAL SIGNS: ED Triage Vitals  Enc Vitals Group     BP 10/31/20 2143 (!) 178/127     Pulse Rate 10/31/20 2143 (!) 115     Resp 10/31/20 2143 20     Temp 10/31/20 2143 98.9 F (37.2 C)     Temp Source 10/31/20 2143 Oral     SpO2 10/31/20 2143 99 %     Weight 10/31/20 2144 191 lb (86.6 kg)     Height 10/31/20 2144 5\' 9"  (1.753 m)     Head Circumference --      Peak Flow --      Pain Score 10/31/20 2144 8     Pain Loc --      Pain Edu? --      Excl. in GC? --     Constitutional: Alert  and oriented. Well appearing and in no apparent distress. HEENT:      Head: Normocephalic and atraumatic.         Eyes: Conjunctivae are normal. Sclera is non-icteric.       Mouth/Throat: Mucous membranes are moist.       Neck: Supple with no signs of meningismus. Cardiovascular: Regular rate and rhythm.  Respiratory: Normal respiratory effort.  Gastrointestinal: Soft, non tender, and non distended. Musculoskeletal: No edema, cyanosis, or erythema of extremities. Neurologic: Normal speech and language. Face is symmetric. Moving all extremities. No gross focal neurologic deficits are appreciated. Skin: Skin is warm, dry and intact. No rash noted. Psychiatric: Mood and affect are normal. Speech and behavior are normal.  ____________________________________________   LABS (all labs ordered are listed, but only abnormal results are displayed)  Labs Reviewed  COMPREHENSIVE METABOLIC PANEL - Abnormal; Notable for the following components:      Result Value   Total Protein 8.2 (*)    All other components within normal limits  SALICYLATE LEVEL - Abnormal; Notable for the following components:   Salicylate Lvl <7.0 (*)    All  other components within normal limits  ACETAMINOPHEN LEVEL - Abnormal; Notable for the following components:   Acetaminophen (Tylenol), Serum <10 (*)    All other components within normal limits  URINE DRUG SCREEN, QUALITATIVE ([facility] ONLY) - Abnormal; Notable for the following components:   Cocaine Metabolite,[place] POSITIVE (*)    All other components within normal limits  ETHANOL  CBC  TROPONIN I (HIGH SENSITIVITY)   ____________________________________________  EKG  ED ECG REPORT I, Nita Sickle, the attending physician, personally viewed and interpreted this ECG.  Sinus tachycardia, rate of 102, LVH, normal intervals, no ST elevation.  No significant changes when compared to prior from July 2021 ____________________________________________  RADIOLOGY  I have personally reviewed the images performed during this visit and I agree with the Radiologist's read.   Interpretation by Radiologist:  DG Chest 2 View  Result Date: 10/31/2020 CLINICAL DATA:  Chest pain EXAM: CHEST - 2 VIEW COMPARISON:  07/12/2020 FINDINGS: The heart size and mediastinal contours are within normal limits. Both lungs are clear. The visualized skeletal structures are unremarkable. IMPRESSION: No active cardiopulmonary disease. Electronically Signed   By: Deatra Robinson M.D.   On: 10/31/2020 22:05     ____________________________________________   PROCEDURES  Procedure(s) performed: None Procedures Critical Care performed:  None ____________________________________________   INITIAL IMPRESSION / ASSESSMENT AND PLAN / ED COURSE  54 y.o. male with history of HIV, hypertension, cocaine dependence, depression who is brought in by police voluntarily for depression, hopelessness, and homelessness.  Patient does report that he feels safe in the hospital and is able to contract for safety.  He denies any active suicidal thoughts at this time.  Is asking for help with placement.  Last cocaine and alcohol  use just prior to arrival.  Clinically looks sober.  Labs with no electrolyte derangements, stable creatinine, negative alcohol level, negative salicylate and acetaminophen levels, normal CBC, UDS positive for cocaine.  Patient at this time will remain voluntary.  TTS and psychiatry have been consulted.  Old medical records from recent psych admission reviewed.    The patient has been placed in psychiatric observation due to the need to provide a safe environment for the patient while obtaining psychiatric consultation and evaluation, as well as ongoing medical and medication management to treat the patient's condition.  The patient has not been placed under full IVC  at this time.    Please note:  Patient was evaluated in Emergency Department today for the symptoms described in the history of present illness. Patient was evaluated in the context of the global COVID-19 pandemic, which necessitated consideration that the patient might be at risk for infection with the SARS-CoV-2 virus that causes COVID-19. Institutional protocols and algorithms that pertain to the evaluation of patients at risk for COVID-19 are in a state of rapid change based on information released by regulatory bodies including the CDC and federal and state organizations. These policies and algorithms were followed during the patient's care in the ED.  Some ED evaluations and interventions may be delayed as a result of limited staffing during the pandemic.   ____________________________________________   FINAL CLINICAL IMPRESSION(S) / ED DIAGNOSES   Final diagnoses:  Severe episode of recurrent major depressive disorder, without psychotic features (HCC)  Suicidal ideation  Cocaine abuse (HCC)  Alcohol abuse      NEW MEDICATIONS STARTED DURING THIS VISIT:  ED Discharge Orders    None       Note:  This document was prepared using Dragon voice recognition software and may include unintentional dictation errors.      Nita Sickle, MD 11/01/20 [phone]

## 2020-11-01 NOTE — BH Assessment (Signed)
Patient can come down NOW  Call to give report: [phone]  Patient is to be admitted to [facility] by Dr. Neale Burly.  Attending Physician will be. Dr. Neale Burly.   Patient has been assigned to room 322, by [facility] Charge Nurse Ivonne Andrew, RN.   Intake Paper Work has been signed and placed on patient chart.  ER staff is aware of the admission: 1. Misty Stanley, ER Secretary  2. Siadecki, ER MD  3. Amy T. [name] Nurse  4. THO Patient Access.

## 2020-11-01 NOTE — Consult Note (Signed)
[facility] Face-to-Face Psychiatry Consult   Reason for Consult:   Consult for 54 year old man well-known to the psychiatric service came to the emergency room reporting suicidal ideation Referring Physician: Siadecki Patient Identification: Roy Liter Bloomfield Sr. MRN:  [id number] Principal Diagnosis: Major depressive disorder, recurrent severe without psychotic features (HCC) Diagnosis:  Principal Problem:   Major depressive disorder, recurrent severe without psychotic features (HCC) Active Problems:   HIV disease (HCC)   HTN (hypertension)   Dyslipidemia   Cocaine dependence (HCC)   Substance induced mood disorder (HCC)   Total Time spent with patient: 1 hour  Subjective:   Roy Register Sr. is a 54 y.o. male patient admitted with "things are just not working out for me".  HPI: Patient seen chart reviewed.  Patient is a good historian.  Reports that when he left the hospital last time he had no stable place to stay and wound up going back to the same boardinghouse where he had been before to try and hang out with some other people.  He says inevitably he started using drugs again when he was there.  Has been using cocaine continuously since then.  Mood is hopeless down and depressed.  Suicidal thoughts are present with thoughts of "walking out into traffic".  Denies auditory or visual hallucinations.  He has been taking his normal medicines for his HIV disease and high blood pressure.  Patient is currently cooperative with treatment.  Not expressing psychotic symptoms.  Does continue to have hopeless suicidal thoughts.  Past Psychiatric History: Past history of recurrent episodes of severe depression and accompanied by substance abuse  Risk to Self: Suicidal Ideation: Yes-Currently Present Suicidal Intent: Yes-Currently Present Is patient at risk for suicide?: Yes Suicidal Plan?: Yes-Currently Present (Pt verbalizes a plan to step out in front of a car) Specify Current Suicidal Plan: Pt  verbalized a plan to step in front of a car Access to Means: Yes Specify Access to Suicidal Means: Pt has access to moving traffic in the community What has been your use of drugs/alcohol within the last 12 months?: Cocaine; Alcohol How many times?: 2 Other Self Harm Risks: Bipolar depression dx Triggers for Past Attempts: Unpredictable Intentional Self Injurious Behavior: None Risk to Others: Homicidal Ideation: No Thoughts of Harm to Others: No Current Homicidal Intent: No Current Homicidal Plan: No Access to Homicidal Means: No Identified Victim: n/a History of harm to others?: No Assessment of Violence: None Noted Violent Behavior Description: n/a Does patient have access to weapons?: No Criminal Charges Pending?: Yes Describe Pending Criminal Charges: Shoplifting Prior Inpatient Therapy: Prior Inpatient Therapy: Yes Prior Therapy Dates: 2017, 2018, 2019, 2020, 2021 Prior Therapy Facilty/Provider(s): [facility], [facility], [facility] Reason for Treatment: Bipolar Depression; SI Prior Outpatient Therapy: Prior Outpatient Therapy: Yes Prior Therapy Dates:  (Unable to recall) Prior Therapy Facilty/Provider(s): [facility] Reason for Treatment: Cocaine abuse d/o; bipolar depression Does patient have an ACCT team?: No Does patient have Intensive In-House Services?  : No Does patient have [facility] services? : No Does patient have P4CC services?: No  Past Medical History:  Past Medical History:  Diagnosis Date  . AIDS (acquired immune deficiency syndrome) (HCC)   . Arthritis   . Asthma   . Bipolar disorder (HCC)   . Bronchitis   . Complication of anesthesia   . Coronary artery disease   . Depression   . Dysrhythmia    1st degree heart block/ brady  . GERD (gastroesophageal reflux disease)   . Hepatitis C  treated  . HIV (human immunodeficiency virus infection) (HCC)   . HTN (hypertension)   . PONV (postoperative nausea and vomiting)     Past Surgical History:  Procedure Laterality  Date  . APPLICATION OF WOUND VAC Right 09/01/2019   Procedure: APPLICATION OF WOUND VAC;  Surgeon: Kennedy Bucker, MD;  Location: [facility];  Service: Orthopedics;  Laterality: Right;  Serial # [id number]  . HERNIA REPAIR Left    inguinal  . TOE SURGERY    . TOE SURGERY Right   . TOTAL HIP ARTHROPLASTY Right 09/01/2019   Procedure: TOTAL HIP ARTHROPLASTY ANTERIOR APPROACH;  Surgeon: Kennedy Bucker, MD;  Location: [facility];  Service: Orthopedics;  Laterality: Right;   Family History:  Family History  Problem Relation Age of Onset  . Cancer Brother   . Uterine cancer Mother   . CAD Mother   . Hypertension Mother   . Hyperlipidemia Mother    Family Psychiatric  History: See previous Social History:  Social History   Substance and Sexual Activity  Alcohol Use Yes  . Alcohol/week: 84.0 standard drinks  . Types: 84 Cans of beer per week   Comment: daily     Social History   Substance and Sexual Activity  Drug Use Yes  . Types: Cocaine, "Crack" cocaine   Comment: states last use 10/31/2020    Social History   Socioeconomic History  . Marital status: Divorced    Spouse name: Not on file  . Number of children: Not on file  . Years of education: Not on file  . Highest education level: Not on file  Occupational History  . Not on file  Tobacco Use  . Smoking status: Current Every Day Smoker    Packs/day: 0.25    Types: Cigarettes  . Smokeless tobacco: Never Used  Vaping Use  . Vaping Use: Never used  Substance and Sexual Activity  . Alcohol use: Yes    Alcohol/week: 84.0 standard drinks    Types: 84 Cans of beer per week    Comment: daily  . Drug use: Yes    Types: Cocaine, "Crack" cocaine    Comment: states last use 10/31/2020  . Sexual activity: Yes  Other Topics Concern  . Not on file  Social History Narrative  . Not on file   Social Determinants of Health   Financial Resource Strain:   . Difficulty of Paying Living Expenses: Not on file  Food Insecurity:   .  Worried About Programme researcher, broadcasting/film/video in the Last Year: Not on file  . Ran Out of Food in the Last Year: Not on file  Transportation Needs:   . Lack of Transportation (Medical): Not on file  . Lack of Transportation (Non-Medical): Not on file  Physical Activity:   . Days of Exercise per Week: Not on file  . Minutes of Exercise per Session: Not on file  Stress:   . Feeling of Stress : Not on file  Social Connections:   . Frequency of Communication with Friends and Family: Not on file  . Frequency of Social Gatherings with Friends and Family: Not on file  . Attends Religious Services: Not on file  . Active Member of Clubs or Organizations: Not on file  . Attends Banker Meetings: Not on file  . Marital Status: Not on file   Additional Social History:    Allergies:   Allergies  Allergen Reactions  . Amlodipine Swelling    Of the tongue  . Lisinopril  Swelling  . Lactose Other (See Comments)    GI distress  . Pollen Extract Other (See Comments)    Itchy eyes and runny nose    Labs:  Results for orders placed or performed during the hospital encounter of 10/31/20 (from the past 48 hour(s))  Comprehensive metabolic panel     Status: Abnormal   Collection Time: 10/31/20  9:58 PM  Result Value Ref Range   Sodium 136 135 - 145 mmol/L   Potassium 4.2 3.5 - 5.1 mmol/L   Chloride 101 98 - 111 mmol/L   CO2 22 22 - 32 mmol/L   Glucose, Bld 98 70 - 99 mg/dL    Comment: Glucose reference range applies only to samples taken after fasting for at least 8 hours.   BUN 20 6 - 20 mg/dL   Creatinine, Ser 7.82 0.61 - 1.24 mg/dL   Calcium 8.9 8.9 - 95.6 mg/dL   Total Protein 8.2 (H) 6.5 - 8.1 g/dL   Albumin 4.3 3.5 - 5.0 g/dL   AST 21 15 - 41 U/L   ALT 20 0 - 44 U/L   Alkaline Phosphatase 81 38 - 126 U/L   Total Bilirubin 0.9 0.3 - 1.2 mg/dL   GFR, Estimated >21 >30 mL/min    Comment: (NOTE) Calculated using the CKD-EPI Creatinine Equation (2021)    Anion gap 13 5 - 15     Comment: Performed at [facility], [address] [place], [place] [address]  Ethanol     Status: None   Collection Time: 10/31/20  9:58 PM  Result Value Ref Range   Alcohol, Ethyl (B) <10 <10 mg/dL    Comment: (NOTE) Lowest detectable limit for serum alcohol is 10 mg/dL.  For medical purposes only. Performed at [facility], [address] [place], [place] [address]   Salicylate level     Status: Abnormal   Collection Time: 10/31/20  9:58 PM  Result Value Ref Range   Salicylate Lvl <7.0 (L) 7.0 - 30.0 mg/dL    Comment: Performed at [facility], [address] [place], [place] [address]  Acetaminophen level     Status: Abnormal   Collection Time: 10/31/20  9:58 PM  Result Value Ref Range   Acetaminophen (Tylenol), Serum <10 (L) 10 - 30 ug/mL    Comment: (NOTE) Therapeutic concentrations vary significantly. A range of 10-30 ug/mL  may be an effective concentration for many patients. However, some  are best treated at concentrations outside of this range. Acetaminophen concentrations >150 ug/mL at 4 hours after ingestion  and >50 ug/mL at 12 hours after ingestion are often associated with  toxic reactions.  Performed at [facility], [address] [place], [place] [address]   cbc     Status: None   Collection Time: 10/31/20  9:58 PM  Result Value Ref Range   WBC 8.1 4.0 - 10.5 K/uL   RBC 4.35 4.22 - 5.81 MIL/uL   Hemoglobin 14.0 13.0 - 17.0 g/dL   HCT 01.0 39 - 52 %   MCV 94.5 80.0 - 100.0 fL   MCH 32.2 26.0 - 34.0 pg   MCHC 34.1 30.0 - 36.0 g/dL   RDW 27.2 53.6 - 64.4 %   Platelets 264 150 - 400 K/uL   nRBC 0.0 0.0 - 0.2 %    Comment: Performed at [facility], [address], [place], [place] [address]  Urine Drug Screen, Qualitative     Status: Abnormal  Collection Time: 10/31/20  9:58 PM  Result Value Ref Range   Tricyclic, Ur Screen NONE DETECTED NONE DETECTED   Amphetamines, Ur Screen NONE DETECTED NONE  DETECTED   MDMA (Ecstasy)Ur Screen NONE DETECTED NONE DETECTED   Cocaine Metabolite,Ur [place] POSITIVE (A) NONE DETECTED   Opiate, Ur Screen NONE DETECTED NONE DETECTED   Phencyclidine (PCP) Ur S NONE DETECTED NONE DETECTED   Cannabinoid 50 Ng, Ur [place] NONE DETECTED NONE DETECTED   Barbiturates, Ur Screen NONE DETECTED NONE DETECTED   Benzodiazepine, Ur Scrn NONE DETECTED NONE DETECTED   Methadone Scn, Ur NONE DETECTED NONE DETECTED    Comment: (NOTE) Tricyclics + metabolites, urine    Cutoff 1000 ng/mL Amphetamines + metabolites, urine  Cutoff 1000 ng/mL MDMA (Ecstasy), urine              Cutoff 500 ng/mL Cocaine Metabolite, urine          Cutoff 300 ng/mL Opiate + metabolites, urine        Cutoff 300 ng/mL Phencyclidine (PCP), urine         Cutoff 25 ng/mL Cannabinoid, urine                 Cutoff 50 ng/mL Barbiturates + metabolites, urine  Cutoff 200 ng/mL Benzodiazepine, urine              Cutoff 200 ng/mL Methadone, urine                   Cutoff 300 ng/mL  The urine drug screen provides only a preliminary, unconfirmed analytical test result and should not be used for non-medical purposes. Clinical consideration and professional judgment should be applied to any positive drug screen result due to possible interfering substances. A more specific alternate chemical method must be used in order to obtain a confirmed analytical result. Gas chromatography / mass spectrometry (GC/MS) is the preferred confirm atory method. Performed at [facility], [address] [place], [place] [address]   Troponin I (High Sensitivity)     Status: None   Collection Time: 10/31/20  9:58 PM  Result Value Ref Range   Troponin I (High Sensitivity) 12 <18 ng/L    Comment: (NOTE) Elevated high sensitivity troponin I (hsTnI) values and significant  changes across serial measurements may suggest ACS but many other  chronic and acute conditions are known to elevate hsTnI results.  Refer to the  "Links" section for chest pain algorithms and additional  guidance. Performed at [facility], [address] [place], [place] [address]   Resp Panel by RT PCR (RSV, Flu A&B, Covid) - Nasopharyngeal Swab     Status: None   Collection Time: 11/01/20  8:14 AM   Specimen: Nasopharyngeal Swab  Result Value Ref Range   SARS Coronavirus 2 by RT PCR NEGATIVE NEGATIVE    Comment: (NOTE) SARS-CoV-2 target nucleic acids are NOT DETECTED.  The SARS-CoV-2 RNA is generally detectable in upper respiratoy specimens during the acute phase of infection. The lowest concentration of SARS-CoV-2 viral copies this assay can detect is 131 copies/mL. A negative result does not preclude SARS-Cov-2 infection and should not be used as the sole basis for treatment or other patient management decisions. A negative result may occur with  improper specimen collection/handling, submission of specimen other than nasopharyngeal swab, presence of viral mutation(s) within the areas targeted by this assay, and inadequate number of viral copies (<131 copies/mL). A negative result must be combined with clinical observations, patient history,  and epidemiological information. The expected result is Negative.  Fact Sheet for Patients:  https://www.moore.com/  Fact Sheet for Healthcare Providers:  https://www.young.biz/  This test is no t yet approved or cleared by the [place] FDA and  has been authorized for detection and/or diagnosis of SARS-CoV-2 by FDA under an Emergency Use Authorization (EUA). This EUA will remain  in effect (meaning this test can be used) for the duration of the COVID-19 declaration under Section 564(b)(1) of the Act, 21 U.S.C. section 360bbb-3(b)(1), unless the authorization is terminated or revoked sooner.     Influenza A by PCR NEGATIVE NEGATIVE   Influenza B by PCR NEGATIVE NEGATIVE    Comment: (NOTE) The Xpert Xpress  SARS-CoV-2/FLU/RSV assay is intended as an aid in  the diagnosis of influenza from Nasopharyngeal swab specimens and  should not be used as a sole basis for treatment. Nasal washings and  aspirates are unacceptable for Xpert Xpress SARS-CoV-2/FLU/RSV  testing.  Fact Sheet for Patients: https://www.moore.com/  Fact Sheet for Healthcare Providers: https://www.young.biz/  This test is not yet approved or cleared by the [place] FDA and  has been authorized for detection and/or diagnosis of SARS-CoV-2 by  FDA under an Emergency Use Authorization (EUA). This EUA will remain  in effect (meaning this test can be used) for the duration of the  Covid-19 declaration under Section 564(b)(1) of the Act, 21  U.S.C. section 360bbb-3(b)(1), unless the authorization is  terminated or revoked.    Respiratory Syncytial Virus by PCR NEGATIVE NEGATIVE    Comment: (NOTE) Fact Sheet for Patients: https://www.moore.com/  Fact Sheet for Healthcare Providers: https://www.young.biz/  This test is not yet approved or cleared by the [place] FDA and  has been authorized for detection and/or diagnosis of SARS-CoV-2 by  FDA under an Emergency Use Authorization (EUA). This EUA will remain  in effect (meaning this test can be used) for the duration of the  COVID-19 declaration under Section 564(b)(1) of the Act, 21 U.S.C.  section 360bbb-3(b)(1), unless the authorization is terminated or  revoked. Performed at [facility], [address], [place], [place] [address]     Current Facility-Administered Medications  Medication Dose Route Frequency Provider Last Rate Last Admin  . albuterol (VENTOLIN HFA) 108 (90 Base) MCG/ACT inhaler 1-2 puff  1-2 puff Inhalation Q4H PRN Don Perking, [place], [place]      . bictegravir-emtricitabine-tenofovir AF (BIKTARVY) 50-200-25 MG per tablet 1 tablet  1 tablet Oral Daily Don Perking,  [place], MD   1 tablet at 11/01/20 1027  . citalopram (CELEXA) tablet 40 mg  40 mg Oral Daily Don Perking, [place], MD   40 mg at 11/01/20 1027  . risperiDONE (RISPERDAL) tablet 1 mg  1 mg Oral QHS Verones[place], [place], MD      . traZODone (DESYREL) tablet 150 mg  150 mg Oral QHS PRN Nita Sickle, MD       Current Outpatient Medications  Medication Sig Dispense Refill  . albuterol (VENTOLIN HFA) 108 (90 Base) MCG/ACT inhaler Inhale 1-2 puffs into the lungs every 4 (four) hours as needed for wheezing or shortness of breath. 18 g 1  . atorvastatin (LIPITOR) 10 MG tablet Take 1 tablet (10 mg total) by mouth daily at 6 PM. (Patient not taking: Reported on 07/01/2020) 30 tablet 1  . bictegravir-emtricitabine-tenofovir AF (BIKTARVY) 50-200-25 MG TABS tablet Take 1 tablet by mouth daily. (Patient not taking: Reported on 07/01/2020) 30 tablet 1  . citalopram (CELEXA) 40 MG tablet Take 1 tablet (40 mg total)  by mouth daily. 30 tablet 1  . hydrOXYzine (ATARAX/VISTARIL) 50 MG tablet Take 1 tablet (50 mg total) by mouth 3 (three) times daily as needed. (Patient not taking: Reported on 07/01/2020) 30 tablet 0  . mirtazapine (REMERON) 7.5 MG tablet Take 1 tablet (7.5 mg total) by mouth at bedtime. 30 tablet 1  . risperiDONE (RISPERDAL) 1 MG tablet Take 1 tablet (1 mg total) by mouth at bedtime. 30 tablet 1  . trazodone (DESYREL) 150 MG tablet Take 1 tablet (150 mg total) by mouth at bedtime as needed for sleep. 30 tablet 1    Musculoskeletal: Strength & Muscle Tone: within normal limits Gait & Station: normal Patient leans: N/A  Psychiatric Specialty Exam: Physical Exam Vitals and nursing note reviewed.  Constitutional:      Appearance: He is well-developed.  HENT:     Head: Normocephalic and atraumatic.  Eyes:     Conjunctiva/sclera: Conjunctivae normal.     Pupils: Pupils are equal, round, and reactive to light.  Cardiovascular:     Heart sounds: Normal heart sounds.  Pulmonary:     Effort:  Pulmonary effort is normal.  Abdominal:     Palpations: Abdomen is soft.  Musculoskeletal:        General: Normal range of motion.     Cervical back: Normal range of motion.  Skin:    General: Skin is warm and dry.  Neurological:     General: No focal deficit present.     Mental Status: He is alert.  Psychiatric:        Attention and Perception: Attention normal.        Mood and Affect: Mood is anxious and depressed. Affect is blunt.        Speech: Speech is delayed.        Behavior: Behavior is cooperative.        Thought Content: Thought content includes suicidal ideation. Thought content does not include homicidal ideation. Thought content includes suicidal plan.        Cognition and Memory: Cognition normal.        Judgment: Judgment is impulsive.     Review of Systems  Constitutional: Negative.   HENT: Negative.   Eyes: Negative.   Respiratory: Negative.   Cardiovascular: Negative.   Gastrointestinal: Negative.   Musculoskeletal: Negative.   Skin: Negative.   Neurological: Negative.   Psychiatric/Behavioral: Positive for dysphoric mood and suicidal ideas.    Blood pressure (!) 132/93, pulse (!) 53, temperature 98.4 F (36.9 C), temperature source Oral, resp. rate 20, height 5\' 9"  (1.753 m), weight 86.6 kg, SpO2 98 %.Body mass index is 28.21 kg/m.  General Appearance: Casual  Eye Contact:  Good  Speech:  Clear and Coherent  Volume:  Normal  Mood:  Depressed  Affect:  Congruent  Thought Process:  Coherent  Orientation:  Full (Time, Place, and Person)  Thought Content:  Tangential  Suicidal Thoughts:  Yes.  with intent/plan  Homicidal Thoughts:  No  Memory:  Immediate;   Fair Recent;   Poor Remote;   Poor  Judgement:  Fair  Insight:  Fair  Psychomotor Activity:  Decreased  Concentration:  Concentration: Fair  Recall:  [employer] of Knowledge:  Fair  Language:  Fair  Akathisia:  No  Handed:  Right  AIMS (if indicated):     Assets:  Desire for  Improvement Financial Resources/Insurance Resilience  ADL's:  Impaired  Cognition:  Impaired,  Mild  Sleep:  Treatment Plan Summary: Daily contact with patient to assess and evaluate symptoms and progress in treatment, Medication management and Plan  53 year old man with depression reports suicidal thoughts with plan.  Very depressed and down.  Continued substance abuse.  Patient has benefited in the past from inpatient treatment and will be readmitted to the psychiatric unit.  Continue prior medication.  Labs reviewed.  Case reviewed with TTS and emergency room physician.  Disposition: Recommend psychiatric Inpatient admission when medically cleared.  Mordecai Rasmussen, MD 11/01/2020 11:35 AM

## 2020-11-01 NOTE — ED Notes (Signed)
Received a report that Skylee from [facility] was wanting report on Mr Arp -   No TTS note with assigned bed yet  - sign and held orders are entered   Held for several minutes but had to hang up due to activity on my unit

## 2020-11-01 NOTE — ED Notes (Addendum)
Per Dr Katharina Caper pt can move to [facility]

## 2020-11-01 NOTE — ED Notes (Signed)
Pt denies HI/AVH but endorses SI with no plan. Pt states he has problems with SA and was seen here a few days ago for same. Admits feeling suicidal d/t needing help with SA.

## 2020-11-01 NOTE — Progress Notes (Signed)
Pt rates depression and anxiety both 10/10. Pt denies SI, HI and AVH. Pt states that his two stressors are drugs and being homeless. Pt stated that he had been using crack and drinking two beers a day. Pt is pleasant, calm and cooperative. Pt was educated on on care plan and verbalizes understanding. Torrie Mayers RN

## 2020-11-01 NOTE — ED Notes (Signed)
Pt moved to [facility] Rm 1.

## 2020-11-01 NOTE — H&P (Signed)
Psychiatric Admission Assessment Adult  Patient Identification: Roy Mcbroom Maulden Sr. MRN:  [id number] Date of Evaluation:  11/01/2020 Chief Complaint:  Severe recurrent major depression without psychotic features (HCC) [F33.2] Principal Diagnosis: Severe recurrent major depression without psychotic features (HCC) Diagnosis:  Principal Problem:   Severe recurrent major depression without psychotic features (HCC) Active Problems:   HIV disease (HCC)   Cocaine dependence (HCC)   Allergic rhinitis  History of Present Illness: Patient seen today at bedside. He states after discharge he did not pick up his medications due to lack of funds. He stayed with his girlfriend Saturday and Sunday, but had to leave on Monday because she is in section 8 housing. After this he went back to old neighborhood, and quickly began using crack cocaine. He states he has been stealing meat and other food to sell for money. He notes that he has also bought crack on credit, and is afraid he will be killed due to inability to bay back dealers with social security income. He is also concerned about his upcoming court date on Nov 29th for misdemeanor larceny from stealing food from [place]. He knows he is facing misdemeanor habitual and potential for 6-10 years jail time he does not believe he will survive. He is hopeful to get into treatment for substance abuse, and "stay stopped for good" from drugs. He notes it is causing serious issues in his life including discord with girlfriend, health consequences, and legal consequences. Today he notes he has fleeting thoughts of suicide, but does not have plan at this time. He notes he feels safe in the hospital, and able to resist urges. He denies homicidal ideation, visual hallucinations, and auditory hallucinations.   Associated Signs/Symptoms: Depression Symptoms:  depressed mood, anhedonia, insomnia, fatigue, feelings of worthlessness/guilt, difficulty  concentrating, hopelessness, recurrent thoughts of death, suicidal thoughts with specific plan, weight loss, decreased appetite, Duration of Depression Symptoms: No data recorded (Hypo) Manic Symptoms:  Impulsivity, Anxiety Symptoms:  Excessive Worry, Panic Symptoms, Psychotic Symptoms:  Denies Duration of Psychotic Symptoms: No data recorded PTSD Symptoms: Negative Total Time spent with patient: 45 minutes  Past Psychiatric History: Patient has had several prior hospitalizations similar circumstances. Recurrent problems with drug abuse complicating his mood and making it difficult for him to maintain any stability.   Is the patient at risk to self? Yes.    Has the patient been a risk to self in the past 6 months? Yes.    Has the patient been a risk to self within the distant past? Yes.    Is the patient a risk to others? No.  Has the patient been a risk to others in the past 6 months? No.  Has the patient been a risk to others within the distant past? No.   Prior Inpatient Therapy:   Prior Outpatient Therapy:    Alcohol Screening: Patient refused Alcohol Screening Tool:  (no) 1. How often do you have a drink containing alcohol?: 4 or more times a week 2. How many drinks containing alcohol do you have on a typical day when you are drinking?: 1 or 2 3. How often do you have six or more drinks on one occasion?: Never AUDIT-C Score: 4 4. How often during the last year have you found that you were not able to stop drinking once you had started?: Never 5. How often during the last year have you failed to do what was normally expected from you because of drinking?: Never 6. How often during the  last year have you needed a first drink in the morning to get yourself going after a heavy drinking session?: Never 7. How often during the last year have you had a feeling of guilt of remorse after drinking?: Never 8. How often during the last year have you been unable to remember what  happened the night before because you had been drinking?: Never 9. Have you or someone else been injured as a result of your drinking?: No 10. Has a relative or friend or a doctor or another health worker been concerned about your drinking or suggested you cut down?: No Alcohol Use Disorder Identification Test Final Score (AUDIT): 4 Alcohol Brief Interventions/Follow-up: AUDIT Score <7 follow-up not indicated Substance Abuse History in the last 12 months:  Yes.   Consequences of Substance Abuse: Medical Consequences:  HIV infection Legal Consequences:  court date for larceny, criminal history of larceny and posession  Family Consequences:  causing discord with fiance Withdrawal Symptoms:   Headaches Nausea Previous Psychotropic Medications: Yes  Psychological Evaluations: Yes  Past Medical History:  Past Medical History:  Diagnosis Date  . AIDS (acquired immune deficiency syndrome) (HCC)   . Arthritis   . Asthma   . Bipolar disorder (HCC)   . Bronchitis   . Complication of anesthesia   . Coronary artery disease   . Depression   . Dysrhythmia    1st degree heart block/ brady  . GERD (gastroesophageal reflux disease)   . Hepatitis C    treated  . HIV (human immunodeficiency virus infection) (HCC)   . HTN (hypertension)   . PONV (postoperative nausea and vomiting)     Past Surgical History:  Procedure Laterality Date  . APPLICATION OF WOUND VAC Right 09/01/2019   Procedure: APPLICATION OF WOUND VAC;  Surgeon: Kennedy Bucker, MD;  Location: [facility] ORS;  Service: Orthopedics;  Laterality: Right;  Serial # [id number]  . HERNIA REPAIR Left    inguinal  . TOE SURGERY    . TOE SURGERY Right   . TOTAL HIP ARTHROPLASTY Right 09/01/2019   Procedure: TOTAL HIP ARTHROPLASTY ANTERIOR APPROACH;  Surgeon: Kennedy Bucker, MD;  Location: [facility] ORS;  Service: Orthopedics;  Laterality: Right;   Family History:  Family History  Problem Relation Age of Onset  . Cancer Brother   . Uterine cancer  Mother   . CAD Mother   . Hypertension Mother   . Hyperlipidemia Mother    Family Psychiatric  History: Denies Tobacco Screening: Have you used any form of tobacco in the last 30 days? (Cigarettes, Smokeless Tobacco, Cigars, and/or Pipes): Yes Tobacco use, Select all that apply: 5 or more cigarettes per day Are you interested in Tobacco Cessation Medications?: No, patient refused Counseled patient on smoking cessation including recognizing danger situations, developing coping skills and basic information about quitting provided: Refused/Declined practical counseling Social History:  Social History   Substance and Sexual Activity  Alcohol Use Yes  . Alcohol/week: 84.0 standard drinks  . Types: 84 Cans of beer per week   Comment: daily     Social History   Substance and Sexual Activity  Drug Use Yes  . Types: Cocaine, "Crack" cocaine   Comment: states last use 10/31/2020    Additional Social History:                           Allergies:   Allergies  Allergen Reactions  . Amlodipine Swelling    Of the tongue  .  Lisinopril Swelling  . Lactose Other (See Comments)    GI distress  . Pollen Extract Other (See Comments)    Itchy eyes and runny nose   Lab Results:  Results for orders placed or performed during the hospital encounter of 10/31/20 (from the past 48 hour(s))  Comprehensive metabolic panel     Status: Abnormal   Collection Time: 10/31/20  9:58 PM  Result Value Ref Range   Sodium 136 135 - 145 mmol/L   Potassium 4.2 3.5 - 5.1 mmol/L   Chloride 101 98 - 111 mmol/L   CO2 22 22 - 32 mmol/L   Glucose, Bld 98 70 - 99 mg/dL    Comment: Glucose reference range applies only to samples taken after fasting for at least 8 hours.   BUN 20 6 - 20 mg/dL   Creatinine, Ser 5.63 0.61 - 1.24 mg/dL   Calcium 8.9 8.9 - 14.9 mg/dL   Total Protein 8.2 (H) 6.5 - 8.1 g/dL   Albumin 4.3 3.5 - 5.0 g/dL   AST 21 15 - 41 U/L   ALT 20 0 - 44 U/L   Alkaline Phosphatase 81 38  - 126 U/L   Total Bilirubin 0.9 0.3 - 1.2 mg/dL   GFR, Estimated >70 >26 mL/min    Comment: (NOTE) Calculated using the CKD-EPI Creatinine Equation (2021)    Anion gap 13 5 - 15    Comment: Performed at [facility], [address] [place], [place] [address]  Ethanol     Status: None   Collection Time: 10/31/20  9:58 PM  Result Value Ref Range   Alcohol, Ethyl (B) <10 <10 mg/dL    Comment: (NOTE) Lowest detectable limit for serum alcohol is 10 mg/dL.  For medical purposes only. Performed at [facility], [address] [place], [place] [address]   Salicylate level     Status: Abnormal   Collection Time: 10/31/20  9:58 PM  Result Value Ref Range   Salicylate Lvl <7.0 (L) 7.0 - 30.0 mg/dL    Comment: Performed at [facility], [address] [place], [place] [address]  Acetaminophen level     Status: Abnormal   Collection Time: 10/31/20  9:58 PM  Result Value Ref Range   Acetaminophen (Tylenol), Serum <10 (L) 10 - 30 ug/mL    Comment: (NOTE) Therapeutic concentrations vary significantly. A range of 10-30 ug/mL  may be an effective concentration for many patients. However, some  are best treated at concentrations outside of this range. Acetaminophen concentrations >150 ug/mL at 4 hours after ingestion  and >50 ug/mL at 12 hours after ingestion are often associated with  toxic reactions.  Performed at [facility], [address] [place], [place] [address]   cbc     Status: None   Collection Time: 10/31/20  9:58 PM  Result Value Ref Range   WBC 8.1 4.0 - 10.5 K/uL   RBC 4.35 4.22 - 5.81 MIL/uL   Hemoglobin 14.0 13.0 - 17.0 g/dL   HCT 72.0 39 - 52 %   MCV 94.5 80.0 - 100.0 fL   MCH 32.2 26.0 - 34.0 pg   MCHC 34.1 30.0 - 36.0 g/dL   RDW 94.7 09.6 - 28.3 %   Platelets 264 150 - 400 K/uL   nRBC 0.0 0.0 - 0.2 %    Comment: Performed at [facility], [address] [place], [place] [address]  Urine Drug Screen,  Qualitative     Status: Abnormal  Collection Time: 10/31/20  9:58 PM  Result Value Ref Range   Tricyclic, Ur Screen NONE DETECTED NONE DETECTED   Amphetamines, Ur Screen NONE DETECTED NONE DETECTED   MDMA (Ecstasy)Ur Screen NONE DETECTED NONE DETECTED   Cocaine Metabolite,Ur [place] POSITIVE (A) NONE DETECTED   Opiate, Ur Screen NONE DETECTED NONE DETECTED   Phencyclidine (PCP) Ur S NONE DETECTED NONE DETECTED   Cannabinoid 50 Ng, Ur [place] NONE DETECTED NONE DETECTED   Barbiturates, Ur Screen NONE DETECTED NONE DETECTED   Benzodiazepine, Ur Scrn NONE DETECTED NONE DETECTED   Methadone Scn, Ur NONE DETECTED NONE DETECTED    Comment: (NOTE) Tricyclics + metabolites, urine    Cutoff 1000 ng/mL Amphetamines + metabolites, urine  Cutoff 1000 ng/mL MDMA (Ecstasy), urine              Cutoff 500 ng/mL Cocaine Metabolite, urine          Cutoff 300 ng/mL Opiate + metabolites, urine        Cutoff 300 ng/mL Phencyclidine (PCP), urine         Cutoff 25 ng/mL Cannabinoid, urine                 Cutoff 50 ng/mL Barbiturates + metabolites, urine  Cutoff 200 ng/mL Benzodiazepine, urine              Cutoff 200 ng/mL Methadone, urine                   Cutoff 300 ng/mL  The urine drug screen provides only a preliminary, unconfirmed analytical test result and should not be used for non-medical purposes. Clinical consideration and professional judgment should be applied to any positive drug screen result due to possible interfering substances. A more specific alternate chemical method must be used in order to obtain a confirmed analytical result. Gas chromatography / mass spectrometry (GC/MS) is the preferred confirm atory method. Performed at [facility], [address] [place], [place] [address]   Troponin I (High Sensitivity)     Status: None   Collection Time: 10/31/20  9:58 PM  Result Value Ref Range   Troponin I (High Sensitivity) 12 <18 ng/L    Comment: (NOTE) Elevated high sensitivity  troponin I (hsTnI) values and significant  changes across serial measurements may suggest ACS but many other  chronic and acute conditions are known to elevate hsTnI results.  Refer to the "Links" section for chest pain algorithms and additional  guidance. Performed at [facility], [address] [place], [place] [address]   Resp Panel by RT PCR (RSV, Flu A&B, Covid) - Nasopharyngeal Swab     Status: None   Collection Time: 11/01/20  8:14 AM   Specimen: Nasopharyngeal Swab  Result Value Ref Range   SARS Coronavirus 2 by RT PCR NEGATIVE NEGATIVE    Comment: (NOTE) SARS-CoV-2 target nucleic acids are NOT DETECTED.  The SARS-CoV-2 RNA is generally detectable in upper respiratoy specimens during the acute phase of infection. The lowest concentration of SARS-CoV-2 viral copies this assay can detect is 131 copies/mL. A negative result does not preclude SARS-Cov-2 infection and should not be used as the sole basis for treatment or other patient management decisions. A negative result may occur with  improper specimen collection/handling, submission of specimen other than nasopharyngeal swab, presence of viral mutation(s) within the areas targeted by this assay, and inadequate number of viral copies (<131 copies/mL). A negative result must be combined with clinical observations, patient history, and  epidemiological information. The expected result is Negative.  Fact Sheet for Patients:  https://www.moore.com/  Fact Sheet for Healthcare Providers:  https://www.young.biz/  This test is no t yet approved or cleared by the [place] FDA and  has been authorized for detection and/or diagnosis of SARS-CoV-2 by FDA under an Emergency Use Authorization (EUA). This EUA will remain  in effect (meaning this test can be used) for the duration of the COVID-19 declaration under Section 564(b)(1) of the Act, 21 U.S.C. section 360bbb-3(b)(1),  unless the authorization is terminated or revoked sooner.     Influenza A by PCR NEGATIVE NEGATIVE   Influenza B by PCR NEGATIVE NEGATIVE    Comment: (NOTE) The Xpert Xpress SARS-CoV-2/FLU/RSV assay is intended as an aid in  the diagnosis of influenza from Nasopharyngeal swab specimens and  should not be used as a sole basis for treatment. Nasal washings and  aspirates are unacceptable for Xpert Xpress SARS-CoV-2/FLU/RSV  testing.  Fact Sheet for Patients: https://www.moore.com/  Fact Sheet for Healthcare Providers: https://www.young.biz/  This test is not yet approved or cleared by the [place] FDA and  has been authorized for detection and/or diagnosis of SARS-CoV-2 by  FDA under an Emergency Use Authorization (EUA). This EUA will remain  in effect (meaning this test can be used) for the duration of the  Covid-19 declaration under Section 564(b)(1) of the Act, 21  U.S.C. section 360bbb-3(b)(1), unless the authorization is  terminated or revoked.    Respiratory Syncytial Virus by PCR NEGATIVE NEGATIVE    Comment: (NOTE) Fact Sheet for Patients: https://www.moore.com/  Fact Sheet for Healthcare Providers: https://www.young.biz/  This test is not yet approved or cleared by the [place] FDA and  has been authorized for detection and/or diagnosis of SARS-CoV-2 by  FDA under an Emergency Use Authorization (EUA). This EUA will remain  in effect (meaning this test can be used) for the duration of the  COVID-19 declaration under Section 564(b)(1) of the Act, 21 U.S.C.  section 360bbb-3(b)(1), unless the authorization is terminated or  revoked. Performed at [facility], [address] [place], [place] [address]     Blood Alcohol level:  Lab Results  Component Value Date   [facility] <10 10/31/2020   ETH <10 10/17/2020    Metabolic Disorder Labs:  Lab Results  Component Value Date    HGBA1C 5.2 05/27/2018   MPG 102.54 05/27/2018   MPG 114 10/26/2016   No results found for: PROLACTIN Lab Results  Component Value Date   CHOL 158 01/23/2019   TRIG 63 01/23/2019   HDL 43 01/23/2019   CHOLHDL 3.7 01/23/2019   VLDL 13 01/23/2019   LDLCALC 102 (H) 01/23/2019   LDLCALC 67 05/27/2018    Current Medications: Current Facility-Administered Medications  Medication Dose Route Frequency Provider Last Rate Last Admin  . acetaminophen (TYLENOL) tablet 650 mg  650 mg Oral Q6H PRN Clapacs, John T, MD      . albuterol (VENTOLIN HFA) 108 (90 Base) MCG/ACT inhaler 1-2 puff  1-2 puff Inhalation Q4H PRN Clapacs, John T, MD      . alum & mag hydroxide-simeth (MAALOX/MYLANTA) 200-200-20 MG/5ML suspension 30 mL  30 mL Oral Q4H PRN Clapacs, John T, MD      . atorvastatin (LIPITOR) tablet 10 mg  10 mg Oral q1800 Clapacs, John T, MD      . bictegravir-emtricitabine-tenofovir AF (BIKTARVY) 50-200-25 MG per tablet 1 tablet  1 tablet Oral Daily Clapacs, Jackquline Denmark, MD      .  citalopram (CELEXA) tablet 40 mg  40 mg Oral Daily Clapacs, John T, MD      . hydrOXYzine (ATARAX/VISTARIL) tablet 50 mg  50 mg Oral TID PRN Jesse Sans, MD      . magnesium hydroxide (MILK OF MAGNESIA) suspension 30 mL  30 mL Oral Daily PRN Clapacs, John T, MD      . mirtazapine (REMERON) tablet 7.5 mg  7.5 mg Oral QHS Clapacs, John T, MD      . oxymetazoline (AFRIN) 0.05 % nasal spray 1 spray  1 spray Each Nare BID Jesse Sans, MD      . protein supplement (ENSURE MAX) liquid  11 oz Oral BID Jesse Sans, MD      . risperiDONE (RISPERDAL) tablet 1 mg  1 mg Oral QHS Clapacs, John T, MD      . sodium chloride (OCEAN) 0.65 % nasal spray 1 spray  1 spray Each Nare Q3H PRN Jesse Sans, MD      . traZODone (DESYREL) tablet 150 mg  150 mg Oral QHS PRN Clapacs, Jackquline Denmark, MD       PTA Medications: Medications Prior to Admission  Medication Sig Dispense Refill Last Dose  . albuterol (VENTOLIN HFA) 108 (90 Base)  MCG/ACT inhaler Inhale 1-2 puffs into the lungs every 4 (four) hours as needed for wheezing or shortness of breath. 18 g 1   . atorvastatin (LIPITOR) 10 MG tablet Take 1 tablet (10 mg total) by mouth daily at 6 PM. (Patient not taking: Reported on 07/01/2020) 30 tablet 1   . bictegravir-emtricitabine-tenofovir AF (BIKTARVY) 50-200-25 MG TABS tablet Take 1 tablet by mouth daily. (Patient not taking: Reported on 07/01/2020) 30 tablet 1   . citalopram (CELEXA) 40 MG tablet Take 1 tablet (40 mg total) by mouth daily. 30 tablet 1   . hydrOXYzine (ATARAX/VISTARIL) 50 MG tablet Take 1 tablet (50 mg total) by mouth 3 (three) times daily as needed. (Patient not taking: Reported on 07/01/2020) 30 tablet 0   . mirtazapine (REMERON) 7.5 MG tablet Take 1 tablet (7.5 mg total) by mouth at bedtime. 30 tablet 1   . risperiDONE (RISPERDAL) 1 MG tablet Take 1 tablet (1 mg total) by mouth at bedtime. 30 tablet 1   . trazodone (DESYREL) 150 MG tablet Take 1 tablet (150 mg total) by mouth at bedtime as needed for sleep. 30 tablet 1     Musculoskeletal: Strength & Muscle Tone: within normal limits Gait & Station: normal Patient leans: N/A  Psychiatric Specialty Exam: Physical Exam Vitals and nursing note reviewed.  Constitutional:      Appearance: Normal appearance.  HENT:     Head: Normocephalic and atraumatic.     Right Ear: External ear normal.     Left Ear: External ear normal.     Nose: Nose normal.     Mouth/Throat:     Mouth: Mucous membranes are moist.     Pharynx: Oropharynx is clear.  Eyes:     Extraocular Movements: Extraocular movements intact.     Conjunctiva/sclera: Conjunctivae normal.     Pupils: Pupils are equal, round, and reactive to light.  Cardiovascular:     Rate and Rhythm: Normal rate.     Pulses: Normal pulses.  Pulmonary:     Effort: Pulmonary effort is normal.     Breath sounds: Normal breath sounds.  Abdominal:     General: Abdomen is flat.     Palpations: Abdomen is soft.   Musculoskeletal:  General: No swelling. Normal range of motion.     Cervical back: Normal range of motion and neck supple.  Skin:    General: Skin is warm and dry.  Neurological:     General: No focal deficit present.     Mental Status: He is alert and oriented to person, place, and time.  Psychiatric:        Attention and Perception: Attention and perception normal.        Mood and Affect: Mood is depressed.        Speech: Speech normal.        Behavior: Behavior is withdrawn.        Thought Content: Thought content includes suicidal ideation.        Cognition and Memory: Cognition and memory normal.        Judgment: Judgment is impulsive.     Review of Systems  Constitutional: Positive for appetite change and fatigue.  HENT: Positive for sinus pressure. Negative for sore throat.   Eyes: Negative for photophobia and visual disturbance.  Respiratory: Negative for cough and shortness of breath.   Cardiovascular: Negative for chest pain and palpitations.  Gastrointestinal: Negative for constipation, diarrhea, nausea and vomiting.  Endocrine: Negative for cold intolerance and heat intolerance.  Genitourinary: Negative for difficulty urinating and dysuria.  Musculoskeletal: Negative for arthralgias and back pain.  Skin: Negative for rash and wound.  Allergic/Immunologic: Negative for environmental allergies and immunocompromised state.  Neurological: Negative for dizziness and headaches.  Hematological: Negative for adenopathy. Does not bruise/bleed easily.  Psychiatric/Behavioral: Positive for dysphoric mood, sleep disturbance and suicidal ideas.    Blood pressure (!) 130/92, temperature 99.2 F (37.3 C), temperature source Oral, height 5\' 9"  (1.753 m), SpO2 100 %.Body mass index is 28.21 kg/m.  General Appearance: Well Groomed  Eye Contact:  Good  Speech:  Clear and Coherent  Volume:  Normal  Mood:  Depressed  Affect:  Congruent  Thought Process:  Coherent and  Linear  Orientation:  Full (Time, Place, and Person)  Thought Content:  Logical  Suicidal Thoughts:  Yes.  without intent/plan  Homicidal Thoughts:  No  Memory:  Immediate;   Fair Recent;   Fair Remote;   Fair  Judgement:  Fair  Insight:  Fair  Psychomotor Activity:  Normal  Concentration:  Concentration: Fair and Attention Span: Fair  Recall:  [employer] of Knowledge:  Fair  Language:  Fair  Akathisia:  Negative  Handed:  Right  AIMS (if indicated):     Assets:  Communication Skills Desire for Improvement Financial Resources/Insurance Intimacy Leisure Time Social Support  ADL's:  Intact  Cognition:  WNL  Sleep:          Treatment Plan Summary: Daily contact with patient to assess and evaluate symptoms and progress in treatment and Medication management PLAN OF CARE: Continue inpatient admission. Allow patient to sign in voluntarily. Will resume medications he was on during last admission (Celexa 40 mg, Remeron 7.5 mg, Risperdal 1 mg QHS, Trazodone 150 mg QHS PRN. Encourage participation in group/mileui activities. Will search for substance abuse treatment options with social work.   Observation Level/Precautions:  15 minute checks  Laboratory:  Completed in ED  Psychotherapy:    Medications:    Consultations:    Discharge Concerns:    Estimated LOS:  Other:     Physician Treatment Plan for Primary Diagnosis: Severe recurrent major depression without psychotic features (HCC) Long Term Goal(s): Improvement in symptoms so as ready for  discharge  Short Term Goals: Ability to identify changes in lifestyle to reduce recurrence of condition will improve, Ability to verbalize feelings will improve, Ability to disclose and discuss suicidal ideas, Ability to demonstrate self-control will improve, Ability to identify and develop effective coping behaviors will improve, Compliance with prescribed medications will improve and Ability to identify triggers associated with substance  abuse/mental health issues will improve  Physician Treatment Plan for Secondary Diagnosis: Principal Problem:   Severe recurrent major depression without psychotic features (HCC) Active Problems:   HIV disease (HCC)   Cocaine dependence (HCC)   Allergic rhinitis  Long Term Goal(s): Improvement in symptoms so as ready for discharge  Short Term Goals: Ability to identify changes in lifestyle to reduce recurrence of condition will improve, Ability to verbalize feelings will improve, Ability to disclose and discuss suicidal ideas, Ability to demonstrate self-control will improve, Ability to identify and develop effective coping behaviors will improve, Compliance with prescribed medications will improve and Ability to identify triggers associated with substance abuse/mental health issues will improve  I certify that inpatient services furnished can reasonably be expected to improve the patient's condition.    Jesse Sans, MD 11/16/20213:11 PM

## 2020-11-01 NOTE — BH Assessment (Signed)
Assessment Note  Roy Pellicane Sr. is an 54 y.o. male.  Per triage note Pt to ED, homeless, brought in voluntarily by police c/o SI.  States recently here for same and needing placement but released d/t no openings.  Pt reports using crack cocaine approx 20 min PTA and 2 40oz beers today.  Pt calm and cooperative in triage.  States thought about stepping into traffic.  States wanting help.  Ambulatory with steady gait, chest rise even and unlabored, in NAD at this time.  Pt presented with seemingly decreased grooming and was resting quietly upon this writer's arrival. Pt spoke in a normal tone and volume. [name] thought processes were relevant/logical. [name] speech was coherent. Eye contact was good. [name] mood was despondent, affect is congruent with mood. Pt was expansive and forthcoming throughout the interview." Patient reported that his stressors are financial issues due to him spending all of his money on drugs. Pt has fair insight as he can articulate the ways in which his substance abuse is worsening and impacting his quality of life. Pt reported that he is homeless. Pt reported that he wants to go to [facility] in [place] to get detoxed. Pt endorses passive SI making statements that he sometimes wants thinks, "a quick death would be nice". Pt verbalized a plan to step out in front of traffic. Pt admits to cocaine and alcohol abuse. Pt denies current HI, AV/hallucinations, or symptoms of paranoia.   Diagnosis: Bipolar Disorder  Past Medical History:  Past Medical History:  Diagnosis Date  . AIDS (acquired immune deficiency syndrome) (HCC)   . Arthritis   . Asthma   . Bipolar disorder (HCC)   . Bronchitis   . Complication of anesthesia   . Coronary artery disease   . Depression   . Dysrhythmia    1st degree heart block/ brady  . GERD (gastroesophageal reflux disease)   . Hepatitis C    treated  . HIV (human immunodeficiency virus infection) (HCC)   . HTN (hypertension)    . PONV (postoperative nausea and vomiting)     Past Surgical History:  Procedure Laterality Date  . APPLICATION OF WOUND VAC Right 09/01/2019   Procedure: APPLICATION OF WOUND VAC;  Surgeon: Kennedy Bucker, MD;  Location: [facility] ORS;  Service: Orthopedics;  Laterality: Right;  Serial # [id number]  . HERNIA REPAIR Left    inguinal  . TOE SURGERY    . TOE SURGERY Right   . TOTAL HIP ARTHROPLASTY Right 09/01/2019   Procedure: TOTAL HIP ARTHROPLASTY ANTERIOR APPROACH;  Surgeon: Kennedy Bucker, MD;  Location: [facility] ORS;  Service: Orthopedics;  Laterality: Right;    Family History:  Family History  Problem Relation Age of Onset  . Cancer Brother   . Uterine cancer Mother   . CAD Mother   . Hypertension Mother   . Hyperlipidemia Mother     Social History:  reports that he has been smoking cigarettes. He has been smoking about 0.25 packs per day. He has never used smokeless tobacco. He reports current alcohol use of about 84.0 standard drinks of alcohol per week. He reports current drug use. Drugs: Cocaine and "Crack" cocaine.  Additional Social History:  Alcohol / Drug Use Pain Medications: See PTA Prescriptions: See PTA History of alcohol / drug use?: Yes Substance #1 Name of Substance 1: Crack Cocaine 1 - Last Use / Amount: 10/31/20 Substance #2 Name of Substance 2: Alcohol 2 - Last Use / Amount: 10/31/20  CIWA: CIWA-Ar BP: Marland Kitchen)  178/127 Pulse Rate: (!) 115 COWS:    Allergies:  Allergies  Allergen Reactions  . Amlodipine Swelling    Of the tongue  . Lisinopril Swelling  . Lactose Other (See Comments)    GI distress  . Pollen Extract Other (See Comments)    Itchy eyes and runny nose    Home Medications: (Not in a hospital admission)   OB/GYN Status:  No LMP for male patient.  General Assessment Data Location of Assessment: [facility] ED TTS Assessment: In system Is this a Tele or Face-to-Face Assessment?: Face-to-Face Is this an Initial Assessment or a Re-assessment for  this encounter?: Initial Assessment Patient Accompanied by:: N/A Language Other than English: No Living Arrangements: Homeless/Shelter What gender do you identify as?: Male Date Telepsych consult ordered in [facility]: 11/01/20 Time Telepsych consult ordered in [facility]: 0013 Marital status: Divorced Maiden name: n/a Pregnancy Status: No Living Arrangements: Other (Comment) (Pt is currently homeless) Can pt return to current living arrangement?: Yes Admission Status: Voluntary Is patient capable of signing voluntary admission?: Yes Referral Source: Self/Family/Friend Insurance type: Medicaid [place]  Medical Screening Exam [facility] Walk-in ONLY) Medical Exam completed: Yes  Crisis Care Plan Living Arrangements: Other (Comment) (Pt is currently homeless) Legal Guardian: Other: (Self) Name of Psychiatrist: none Name of Therapist: n/a  Education Status Is patient currently in school?: No Is the patient employed, unemployed or receiving disability?: Receiving disability income  Risk to self with the past 6 months Suicidal Ideation: Yes-Currently Present Has patient been a risk to self within the past 6 months prior to admission? : No Suicidal Intent: Yes-Currently Present Has patient had any suicidal intent within the past 6 months prior to admission? : Yes Is patient at risk for suicide?: Yes Suicidal Plan?: Yes-Currently Present (Pt verbalizes a plan to step out in front of a car) Has patient had any suicidal plan within the past 6 months prior to admission? : Yes Specify Current Suicidal Plan: Pt verbalized a plan to step in front of a car Access to Means: Yes Specify Access to Suicidal Means: Pt has access to moving traffic in the community What has been your use of drugs/alcohol within the last 12 months?: Cocaine; Alcohol Previous Attempts/Gestures: Yes How many times?: 2 Other Self Harm Risks: Bipolar depression dx Triggers for Past Attempts: Unpredictable Intentional Self Injurious  Behavior: None Family Suicide History: No Recent stressful life event(s): Financial Problems, Conflict (Comment) Persecutory voices/beliefs?: No Depression: Yes Depression Symptoms: Feeling worthless/self pity, Despondent Substance abuse history and/or treatment for substance abuse?: Yes Suicide prevention information given to non-admitted patients: Not applicable  Risk to Others within the past 6 months Homicidal Ideation: No Does patient have any lifetime risk of violence toward others beyond the six months prior to admission? : No Thoughts of Harm to Others: No Current Homicidal Intent: No Current Homicidal Plan: No Access to Homicidal Means: No Identified Victim: n/a History of harm to others?: No Assessment of Violence: None Noted Violent Behavior Description: n/a Does patient have access to weapons?: No Criminal Charges Pending?: Yes Describe Pending Criminal Charges: Shoplifting Is patient on probation?: No  Psychosis Hallucinations: None noted Delusions: None noted  Mental Status Report Appearance/Hygiene: In scrubs Eye Contact: Good Motor Activity: Freedom of movement Speech: Logical/coherent Level of Consciousness: Quiet/awake Mood: Despair, Helpless Affect: Appropriate to circumstance Anxiety Level: Minimal Thought Processes: Relevant, Coherent Judgement: Impaired Orientation: Person, Place, Time, Situation Obsessive Compulsive Thoughts/Behaviors: None  Cognitive Functioning Concentration: Normal Memory: Recent Intact, Remote Intact Is patient IDD: No  Insight: Poor Impulse Control: Poor Appetite: Good Have you had any weight changes? : No Change Sleep: No Change Total Hours of Sleep:  (Pt unable to recall) Vegetative Symptoms: None  ADLScreening [facility] Assessment Services) [name] cognitive ability adequate to safely complete daily activities?: Yes Patient able to express need for assistance with ADLs?: Yes Independently performs ADLs?: Yes  (appropriate for developmental age)  Prior Inpatient Therapy Prior Inpatient Therapy: Yes Prior Therapy Dates: 2017, 2018, 2019, 2020, 2021 Prior Therapy Facilty/Provider(s): [facility], [facility], [facility] Reason for Treatment: Bipolar Depression; SI  Prior Outpatient Therapy Prior Outpatient Therapy: Yes Prior Therapy Dates:  (Unable to recall) Prior Therapy Facilty/Provider(s): [facility] Reason for Treatment: Cocaine abuse d/o; bipolar depression Does patient have an ACCT team?: No Does patient have Intensive In-House Services?  : No Does patient have [facility] services? : No Does patient have P4CC services?: No  ADL Screening (condition at time of admission) [name] cognitive ability adequate to safely complete daily activities?: Yes Is the patient deaf or have difficulty hearing?: No Does the patient have difficulty seeing, even when wearing glasses/contacts?: No Does the patient have difficulty concentrating, remembering, or making decisions?: No Patient able to express need for assistance with ADLs?: Yes Does the patient have difficulty dressing or bathing?: No Independently performs ADLs?: Yes (appropriate for developmental age) Does the patient have difficulty walking or climbing stairs?: No Weakness of Legs: None Weakness of Arms/Hands: None  Home Assistive Devices/Equipment Home Assistive Devices/Equipment: None  Therapy Consults (therapy consults require a physician order) PT Evaluation Needed: No OT Evalulation Needed: No SLP Evaluation Needed: No Abuse/Neglect Assessment (Assessment to be complete while patient is alone) Physical Abuse: Denies Verbal Abuse: Denies Sexual Abuse: Denies Exploitation of patient/patient's resources: Denies Self-Neglect: Denies Values / Beliefs Cultural Requests During Hospitalization: None Spiritual Requests During Hospitalization: None Consults Spiritual Care Consult Needed: No Transition of Care Team Consult Needed: No Advance  Directives (For Healthcare) Does Patient Have a Medical Advance Directive?: No Would patient like information on creating a medical advance directive?: No - Patient declined          Disposition: Per psych NP Barbara Cower, B., pt can be observed overnight and reassessed in the AM.  Disposition Initial Assessment Completed for this Encounter: Yes  On Site Evaluation by:   Reviewed with Physician:    Foy Guadalajara 11/01/2020 3:41 AM

## 2020-11-02 ENCOUNTER — Ambulatory Visit
Admit: 2020-11-02 | Discharge: 2020-11-07 | Disposition: A | Discharge status: Home / Self Care | Payer: MEDICAID | Attending: Emergency Medicine

## 2020-11-02 DIAGNOSIS — Z20822 Encounter for screening laboratory testing for COVID-19 virus in asymptomatic patient: Principal | ICD-10-CM

## 2020-11-02 DIAGNOSIS — I10 Essential (primary) hypertension: Principal | ICD-10-CM

## 2020-11-02 DIAGNOSIS — F17210 Nicotine dependence, cigarettes, uncomplicated: Principal | ICD-10-CM

## 2020-11-02 DIAGNOSIS — Z131 Encounter for screening for diabetes mellitus: Principal | ICD-10-CM

## 2020-11-02 DIAGNOSIS — K219 Gastro-esophageal reflux disease without esophagitis: Principal | ICD-10-CM

## 2020-11-02 DIAGNOSIS — Z8619 Personal history of other infectious and parasitic diseases: Principal | ICD-10-CM

## 2020-11-02 DIAGNOSIS — F319 Bipolar disorder, unspecified: Principal | ICD-10-CM

## 2020-11-02 DIAGNOSIS — E785 Hyperlipidemia, unspecified: Principal | ICD-10-CM

## 2020-11-02 DIAGNOSIS — M1990 Unspecified osteoarthritis, unspecified site: Principal | ICD-10-CM

## 2020-11-02 DIAGNOSIS — F1910 Other psychoactive substance abuse, uncomplicated: Principal | ICD-10-CM

## 2020-11-02 DIAGNOSIS — Z87442 Personal history of urinary calculi: Principal | ICD-10-CM

## 2020-11-02 DIAGNOSIS — Z022 Encounter for examination for admission to residential institution: Principal | ICD-10-CM

## 2020-11-02 DIAGNOSIS — Z825 Family history of asthma and other chronic lower respiratory diseases: Principal | ICD-10-CM

## 2020-11-02 DIAGNOSIS — Z21 Asymptomatic human immunodeficiency virus [HIV] infection status: Principal | ICD-10-CM

## 2020-11-02 DIAGNOSIS — F1421 Cocaine dependence, in remission: Principal | ICD-10-CM

## 2020-11-02 DIAGNOSIS — Z79899 Other long term (current) drug therapy: Principal | ICD-10-CM

## 2020-11-02 DIAGNOSIS — Z96641 Presence of right artificial hip joint: Principal | ICD-10-CM

## 2020-11-02 MED ORDER — HYDROXYZINE HCL 50 MG PO TABS: 50.0000 mg | ORAL_TABLET | Freq: Three times a day (TID) | ORAL | 0 refills | Status: DC | PRN

## 2020-11-02 MED ORDER — MIRTAZAPINE 7.5 MG PO TABS: 7.5000 mg | ORAL_TABLET | Freq: Every day | ORAL | 1 refills | Status: AC

## 2020-11-02 MED ORDER — CITALOPRAM HYDROBROMIDE 40 MG PO TABS: 40.0000 mg | ORAL_TABLET | Freq: Every day | ORAL | 1 refills | Status: DC

## 2020-11-02 MED ORDER — TRAZODONE HCL 150 MG PO TABS: 150.0000 mg | ORAL_TABLET | Freq: Every evening | ORAL | 1 refills | Status: DC | PRN

## 2020-11-02 MED ORDER — RISPERIDONE 1 MG PO TABS: 1.0000 mg | ORAL_TABLET | Freq: Every day | ORAL | 1 refills | Status: DC

## 2020-11-02 MED ORDER — CITALOPRAM 40 MG TABLET
ORAL_TABLET | Freq: Every day | ORAL | 1 refills | 30.00000 days
Start: 2020-11-02 — End: 2020-12-28

## 2020-11-02 MED ORDER — RISPERIDONE 1 MG TABLET
ORAL_TABLET | 1 refills | 0 days
Start: 2020-11-02 — End: ?

## 2020-11-02 MED ORDER — HYDROXYZINE HCL 50 MG TABLET
ORAL_TABLET | 0 refills | 0 days
Start: 2020-11-02 — End: 2020-12-28

## 2020-11-02 MED ORDER — MIRTAZAPINE 7.5 MG TABLET
ORAL_TABLET | 1 refills | 0.00000 days
Start: 2020-11-02 — End: 2021-01-11

## 2020-11-02 MED ORDER — TRAZODONE 150 MG TABLET
ORAL_TABLET | 1 refills | 0.00000 days
Start: 2020-11-02 — End: 2021-01-11
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
## 2020-11-02 NOTE — Progress Notes (Signed)
  [facility] Adult Case Management Discharge Plan :  Will you be returning to the same living situation after discharge:  No. At discharge, do you have transportation home?: Yes,  CSW will assist with transportation arrangements.  Do you have the ability to pay for your medications: Yes,  Medicaid.  Release of information consent forms completed and in the chart;  [name] signature needed at discharge.  Patient to Follow up at:  Follow-up Information    Pt declined Follow up.   Why: Pt declined, stating plans to follow up with his caseworkers at [facility] ID clinic. Patient was provided with resources for [facility] shelter, [employer] [address], and [employer] in [place] and [place], [place].              Next level of care provider has access to [facility] Link:no  Safety Planning and Suicide Prevention discussed: Yes,  pt declined collateral contact. SPE completed with patient.   Have you used any form of tobacco in the last 30 days? (Cigarettes, Smokeless Tobacco, Cigars, and/or Pipes): Yes  Has patient been referred to the Quitline?: Patient refused referral  Patient has been referred for addiction treatment: Pt. refused referral  Glenis Smoker, LCSW 11/02/2020, 10:05 AM

## 2020-11-02 NOTE — BHH Counselor (Signed)
CSW contacted SWer with [facility] at the request of the Patient IOT coordinate post-discharge housing options (Patient provided written consent). [facility] SWer reported the patient was not associated with a Counsellor, nor applied for such programs. [facility] SWer suggested the Pt apply to [employer] (CE[facility]) after reaching shelter to be placed on the waiting list. End of call. CSW relayed information to Patient.   Casimiro Needle, MSW, [place], LCASA 11/02/2020 4:11 PM

## 2020-11-02 NOTE — Progress Notes (Signed)
D: Pt alert and oriented. Pt rates depression 10/10, and anxiety 10/10. Pt reports experiencing 10/10 arthritic joint pain, prn meds given. Pt denies experiencing any SI/HI, or AVH at this time.   A: Scheduled medications administered to pt, per MD orders. Support and encouragement provided. Frequent verbal contact made. Routine safety checks conducted q15 minutes.   R: No adverse drug reactions noted. Pt verbally contracts for safety at this time. Pt complaint with medications. Pt interacts well with others on the unit. Pt remains safe at this time. Will continue to monitor.

## 2020-11-02 NOTE — Progress Notes (Deleted)
  [facility] Adult Case Management Discharge Plan :  Will you be returning to the same living situation after discharge:  No. At discharge, do you have transportation home?: Yes,  CSW will assist with transportation.  Do you have the ability to pay for your medications: Yes,  [employer] Medicaid.  Release of information consent forms completed and in the chart;  [name] signature needed at discharge.  Patient to Follow up at:  Follow-up Information    Pt declined Follow up.   Why: Pt declined, stating plans to follow up with his caseworkers at [facility] ID clinic.              Next level of care provider has access to [facility] Link:no  Safety Planning and Suicide Prevention discussed: Yes,  pt declined collateral contact.  Have you used any form of tobacco in the last 30 days? (Cigarettes, Smokeless Tobacco, Cigars, and/or Pipes): Yes  Has patient been referred to the Quitline?: Patient refused referral  Patient has been referred for addiction treatment: Pt. refused referral  Glenis Smoker, LCSW 11/02/2020, 9:56 AM

## 2020-11-02 NOTE — Progress Notes (Signed)
D: Pt alert and oriented. Pt denies experiencing any SI/HI, or AVH at this time. Pt reports he will be able to keep himself safe when he returns home.  A: Pt received discharge and medication education/information. Pt belongings were returned and confirmed all present at this time.   R: Pt verbalized understanding of discharge and medication education/information.  Pt escorted by staff to physician's parking where pt was picked up by safe transport and taken to [facility]

## 2020-11-02 NOTE — BHH Suicide Risk Assessment (Signed)
[facility] Discharge Suicide Risk Assessment   Principal Problem: Severe recurrent major depression without psychotic features [facility]) Discharge Diagnoses: Principal Problem:   Severe recurrent major depression without psychotic features (HCC) Active Problems:   HIV disease (HCC)   Cocaine dependence (HCC)   Allergic rhinitis   Total Time spent with patient: 30 minutes  Musculoskeletal: Strength & Muscle Tone: within normal limits Gait & Station: normal Patient leans: N/A  Psychiatric Specialty Exam: Review of Systems  Blood pressure 121/87, pulse (!) 51, temperature 98.7 F (37.1 C), temperature source Oral, resp. rate 18, height 5\' 9"  (1.753 m), weight 76.7 kg, SpO2 100 %.Body mass index is 24.96 kg/m.  General Appearance: Well Groomed  ::  Good  Speech:  Clear and Coherent and Normal Rate  Volume:  Normal  Mood:  Euthymic  Affect:  Appropriate and Congruent  Thought Process:  Coherent and Linear  Orientation:  Full (Time, Place, and Person)  Thought Content:  Logical  Suicidal Thoughts:  No  Homicidal Thoughts:  No  Memory:  Immediate;   Good Recent;   Good Remote;   Good  Judgement:  Fair  Insight:  Fair  Psychomotor Activity:  Normal  Concentration:  Good  Recall:  Good  Fund of Knowledge:Good  Language: Good  Akathisia:  Negative  Handed:  Right  AIMS (if indicated):     Assets:  Communication Skills Desire for Improvement Financial Resources/Insurance Intimacy Resilience  Sleep:  Number of Hours: 9  Cognition: WNL  ADL's:  Intact   Mental Status Per Nursing Assessment::   On Admission:  NA  Demographic Factors:  Male  Loss Factors: Legal issues  Historical Factors: Impulsivity  Risk Reduction Factors:   Sense of responsibility to family, Positive social support, Positive therapeutic relationship and Positive coping skills or problem solving skills  Continued Clinical Symptoms:  Alcohol/Substance Abuse/Dependencies Previous Psychiatric  Diagnoses and Treatments Medical Diagnoses and Treatments/Surgeries  Cognitive Features That Contribute To Risk:  None    Suicide Risk:  Minimal: No identifiable suicidal ideation.  Patients presenting with no risk factors but with morbid ruminations; may be classified as minimal risk based on the severity of the depressive symptoms    Plan Of Care/Follow-up recommendations:  Activity:  as tolerated Diet:  low-sodium, heart healthy diet  [phone], MD 11/02/2020, 9:10 AM

## 2020-11-02 NOTE — BHH Counselor (Signed)
CSW discussed aftercare options with Patient. Patient declined aftercare options at this time.   Casimiro Needle, MSW, [place], LCASA 11/02/2020 4:05 PM

## 2020-11-02 NOTE — Discharge Summary (Signed)
Physician Discharge Summary Note  Patient:  Roy Homan Sr. is an 54 y.o., male MRN:  [id number] DOB:  1966-11-30 Patient phone:  [phone] (home)  Patient address:   [place] [address],  Total Time spent with patient: 30 minutes  Date of Admission:  11/01/2020 Date of Discharge: 11/02/2020  Reason for Admission:  Worsening depression and suicidal ideation with plan to walk into traffic in context of homelessness and relapse on cocaine  Principal Problem: Severe recurrent major depression without psychotic features [facility] Discharge Diagnoses: Principal Problem:   Severe recurrent major depression without psychotic features (HCC) Active Problems:   HIV disease (HCC)   Cocaine dependence (HCC)   Allergic rhinitis   Past Psychiatric History: Patient has had several prior hospitalizations similar circumstances. Recurrent problems with drug abuse complicating his mood and making it difficult for him to maintain any stability.  Past Medical History:  Past Medical History:  Diagnosis Date  . AIDS (acquired immune deficiency syndrome) (HCC)   . Arthritis   . Asthma   . Bipolar disorder (HCC)   . Bronchitis   . Complication of anesthesia   . Coronary artery disease   . Depression   . Dysrhythmia    1st degree heart block/ brady  . GERD (gastroesophageal reflux disease)   . Hepatitis C    treated  . HIV (human immunodeficiency virus infection) (HCC)   . HTN (hypertension)   . PONV (postoperative nausea and vomiting)     Past Surgical History:  Procedure Laterality Date  . APPLICATION OF WOUND VAC Right 09/01/2019   Procedure: APPLICATION OF WOUND VAC;  Surgeon: Kennedy Bucker, MD;  Location: [facility] ORS;  Service: Orthopedics;  Laterality: Right;  Serial # [id number]  . HERNIA REPAIR Left    inguinal  . TOE SURGERY    . TOE SURGERY Right   . TOTAL HIP ARTHROPLASTY Right 09/01/2019   Procedure: TOTAL HIP ARTHROPLASTY ANTERIOR APPROACH;  Surgeon: Kennedy Bucker, MD;  Location: [facility] ORS;  Service: Orthopedics;  Laterality: Right;   Family History:  Family History  Problem Relation Age of Onset  . Cancer Brother   . Uterine cancer Mother   . CAD Mother   . Hypertension Mother   . Hyperlipidemia Mother    Family Psychiatric  History: Denies Social History:  Social History   Substance and Sexual Activity  Alcohol Use Yes  . Alcohol/week: 84.0 standard drinks  . Types: 84 Cans of beer per week   Comment: daily     Social History   Substance and Sexual Activity  Drug Use Yes  . Types: Cocaine, "Crack" cocaine   Comment: states last use 10/31/2020    Social History   Socioeconomic History  . Marital status: Divorced    Spouse name: Not on file  . Number of children: Not on file  . Years of education: Not on file  . Highest education level: Not on file  Occupational History  . Not on file  Tobacco Use  . Smoking status: Current Every Day Smoker    Packs/day: 0.25    Types: Cigarettes  . Smokeless tobacco: Never Used  Vaping Use  . Vaping Use: Never used  Substance and Sexual Activity  . Alcohol use: Yes    Alcohol/week: 84.0 standard drinks    Types: 84 Cans of beer per week    Comment: daily  . Drug use: Yes    Types: Cocaine, "Crack" cocaine    Comment: states last use 10/31/2020  .  Sexual activity: Yes  Other Topics Concern  . Not on file  Social History Narrative  . Not on file   Social Determinants of Health   Financial Resource Strain:   . Difficulty of Paying Living Expenses: Not on file  Food Insecurity:   . Worried About Programme researcher, broadcasting/film/video in the Last Year: Not on file  . Ran Out of Food in the Last Year: Not on file  Transportation Needs:   . Lack of Transportation (Medical): Not on file  . Lack of Transportation (Non-Medical): Not on file  Physical Activity:   . Days of Exercise per Week: Not on file  . Minutes of Exercise per Session: Not on file  Stress:   . Feeling of Stress : Not on  file  Social Connections:   . Frequency of Communication with Friends and Family: Not on file  . Frequency of Social Gatherings with Friends and Family: Not on file  . Attends Religious Services: Not on file  . Active Member of Clubs or Organizations: Not on file  . Attends Banker Meetings: Not on file  . Marital Status: Not on file    Hospital Course:  Patient came to hospital voluntarily reporting suicidal ideations with plan to walk into traffic in setting of homelessness and relapse on cocaine. He admits that he was not actually suicidal on admission, but needed a place to stay. His goal is to get to [facility] to be near his infectious disease doctor and away from his known drug contacts in [place]. He plans to stay at the shelter in [facility] and attempt to enter the [facility] for long-term substance abuse treatment. He declines offer to go to [facility] with confirmed beds and substance abuse treatment program at discharge. He also declines resources for [employer]. He denies depression, anxiety, suicidal ideation, homicidal ideation, visual hallucinations, and auditory hallucinations and requests discharge from the hospital today. He plans to take the bus to [place], [place] today to get to the homeless shelter. Will send prescriptions to [facility] outpatient pharmacy.   Physical Findings: AIMS:  , ,  ,  ,    CIWA:    COWS:     Musculoskeletal: Strength & Muscle Tone: within normal limits Gait & Station: normal Patient leans: N/A  Psychiatric Specialty Exam: Physical Exam Vitals and nursing note reviewed.  Constitutional:      Appearance: Normal appearance.  HENT:     Head: Normocephalic and atraumatic.     Right Ear: External ear normal.     Left Ear: External ear normal.     Nose: Nose normal.     Mouth/Throat:     Mouth: Mucous membranes are moist.     Pharynx: Oropharynx is clear.  Eyes:     Extraocular Movements: Extraocular  movements intact.     Conjunctiva/sclera: Conjunctivae normal.     Pupils: Pupils are equal, round, and reactive to light.  Cardiovascular:     Rate and Rhythm: Normal rate.     Pulses: Normal pulses.  Pulmonary:     Effort: Pulmonary effort is normal.     Breath sounds: Normal breath sounds.  Abdominal:     General: Abdomen is flat.     Palpations: Abdomen is soft.  Musculoskeletal:        General: No swelling. Normal range of motion.     Cervical back: Normal range of motion and neck supple.  Skin:    General:  Skin is warm and dry.  Neurological:     General: No focal deficit present.     Mental Status: He is alert and oriented to person, place, and time.  Psychiatric:        Mood and Affect: Mood normal.        Behavior: Behavior normal.        Thought Content: Thought content normal.        Judgment: Judgment normal.     Review of Systems  Constitutional: Negative for appetite change and fatigue.  HENT: Negative for rhinorrhea and sore throat.   Eyes: Negative for photophobia and visual disturbance.  Respiratory: Negative for cough and shortness of breath.   Cardiovascular: Negative for chest pain and palpitations.  Gastrointestinal: Negative for constipation, diarrhea, nausea and vomiting.  Endocrine: Negative for cold intolerance and heat intolerance.  Genitourinary: Negative for difficulty urinating and dysuria.  Musculoskeletal: Negative for arthralgias and myalgias.  Skin: Negative for rash and wound.  Allergic/Immunologic: Negative for immunocompromised state.  Neurological: Negative for dizziness and headaches.  Hematological: Negative for adenopathy. Does not bruise/bleed easily.  Psychiatric/Behavioral: Negative for dysphoric mood, hallucinations and suicidal ideas. The patient is not nervous/anxious.     Blood pressure 121/87, pulse (!) 51, temperature 98.7 F (37.1 C), temperature source Oral, resp. rate 18, height 5\' 9"  (1.753 m), weight 76.7 kg, SpO2 100  %.Body mass index is 24.96 kg/m.  General Appearance: Well Groomed  Patent attorney::  Good  Speech:  Clear and Coherent and Normal Rate  Volume:  Normal  Mood:  Euthymic  Affect:  Appropriate and Congruent  Thought Process:  Coherent and Linear  Orientation:  Full (Time, Place, and Person)  Thought Content:  Logical  Suicidal Thoughts:  No  Homicidal Thoughts:  No  Memory:  Immediate;   Good Recent;   Good Remote;   Good  Judgement:  Fair  Insight:  Fair  Psychomotor Activity:  Normal  Concentration:  Good  Recall:  Good  Fund of Knowledge:Good  Language: Good  Akathisia:  Negative  Handed:  Right  AIMS (if indicated):     Assets:  Communication Skills Desire for Improvement Financial Resources/Insurance Intimacy Resilience  Sleep:  Number of Hours: 9  Cognition: WNL  ADL's:  Intact        Have you used any form of tobacco in the last 30 days? (Cigarettes, Smokeless Tobacco, Cigars, and/or Pipes): Yes  Has this patient used any form of tobacco in the last 30 days? (Cigarettes, Smokeless Tobacco, Cigars, and/or Pipes)  Yes, A prescription for an FDA-approved tobacco cessation medication was offered at discharge and the patient refused  Blood Alcohol level:  Lab Results  Component Value Date   [facility] <10 10/31/2020   ETH <10 10/17/2020    Metabolic Disorder Labs:  Lab Results  Component Value Date   HGBA1C 5.2 05/27/2018   MPG 102.54 05/27/2018   MPG 114 10/26/2016   No results found for: PROLACTIN Lab Results  Component Value Date   CHOL 158 01/23/2019   TRIG 63 01/23/2019   HDL 43 01/23/2019   CHOLHDL 3.7 01/23/2019   VLDL 13 01/23/2019   LDLCALC 102 (H) 01/23/2019   LDLCALC 67 05/27/2018    See Psychiatric Specialty Exam and Suicide Risk Assessment completed by Attending Physician prior to discharge.  Discharge destination:  Other:  homeless shelter  Is patient on multiple antipsychotic therapies at discharge:  No   Has Patient had three or more  failed trials  of antipsychotic monotherapy by history:  No  Recommended Plan for Multiple Antipsychotic Therapies: NA  Discharge Instructions    Diet - low sodium heart healthy   Complete by: As directed    Increase activity slowly   Complete by: As directed      Allergies as of 11/02/2020      Reactions   Amlodipine Swelling   Of the tongue   Lisinopril Swelling   Lactose Other (See Comments)   GI distress   Pollen Extract Other (See Comments)   Itchy eyes and runny nose      Medication List    TAKE these medications     Indication  albuterol 108 (90 Base) MCG/ACT inhaler Commonly known as: VENTOLIN HFA Inhale 1-2 puffs into the lungs every 4 (four) hours as needed for wheezing or shortness of breath.  Indication: Asthma   atorvastatin 10 MG tablet Commonly known as: LIPITOR Take 1 tablet (10 mg total) by mouth daily at 6 PM.  Indication: High Amount of Fats in the Blood   Biktarvy 50-200-25 MG Tabs tablet Generic drug: bictegravir-emtricitabine-tenofovir AF Take 1 tablet by mouth daily.  Indication: HIV Disease   citalopram 40 MG tablet Commonly known as: CELEXA Take 1 tablet (40 mg total) by mouth daily.  Indication: Depression, Generalized Anxiety Disorder, Mood   hydrOXYzine 50 MG tablet Commonly known as: ATARAX/VISTARIL Take 1 tablet (50 mg total) by mouth 3 (three) times daily as needed.  Indication: Feeling Anxious   mirtazapine 7.5 MG tablet Commonly known as: REMERON Take 1 tablet (7.5 mg total) by mouth at bedtime.  Indication: Major Depressive Disorder   risperiDONE 1 MG tablet Commonly known as: RISPERDAL Take 1 tablet (1 mg total) by mouth at bedtime.  Indication: Major Depressive Disorder   traZODone 150 MG tablet Commonly known as: DESYREL Take 1 tablet (150 mg total) by mouth at bedtime as needed for sleep.  Indication: Trouble Sleeping        Follow-up recommendations:  Activity:  as tolerated Diet:  low-sodium heart-healthy  diet  Comments:  30-day scripts with 1 refill sent to [facility] outpatient pharmacy.   Signed: Jesse Sans, MD 11/02/2020, 9:20 AM

## 2020-11-02 NOTE — BHH Counselor (Signed)
CSW provided patient with information on [facility] and [facility].   Pt declined [facility].   CSW reached out to [facility], however, they are at capacity with no available beds.    Patient requested to be taken to the shelter in [facility], despite being unable to confirm bed availability.    Penni Homans, MSW, LCSW 11/02/2020 12:53 PM

## 2020-11-13 ENCOUNTER — Other Ambulatory Visit: Payer: Self-pay

## 2020-11-13 ENCOUNTER — Emergency Department: Payer: Medicaid Other

## 2020-11-13 ENCOUNTER — Emergency Department
Admission: EM | Admit: 2020-11-13 | Discharge: 2020-11-13 | Disposition: A | Discharge status: Home / Self Care | Payer: Medicaid Other | Attending: Emergency Medicine | Admitting: Emergency Medicine

## 2020-11-13 DIAGNOSIS — Z20822 Contact with and (suspected) exposure to covid-19: Secondary | ICD-10-CM | POA: Diagnosis not present

## 2020-11-13 DIAGNOSIS — F1494 Cocaine use, unspecified with cocaine-induced mood disorder: Secondary | ICD-10-CM | POA: Diagnosis not present

## 2020-11-13 DIAGNOSIS — R45851 Suicidal ideations: Secondary | ICD-10-CM | POA: Insufficient documentation

## 2020-11-13 DIAGNOSIS — I10 Essential (primary) hypertension: Secondary | ICD-10-CM | POA: Diagnosis not present

## 2020-11-13 DIAGNOSIS — B20 Human immunodeficiency virus [HIV] disease: Secondary | ICD-10-CM | POA: Diagnosis not present

## 2020-11-13 DIAGNOSIS — J45909 Unspecified asthma, uncomplicated: Secondary | ICD-10-CM | POA: Diagnosis not present

## 2020-11-13 DIAGNOSIS — F17210 Nicotine dependence, cigarettes, uncomplicated: Secondary | ICD-10-CM | POA: Insufficient documentation

## 2020-11-13 DIAGNOSIS — R0602 Shortness of breath: Secondary | ICD-10-CM | POA: Diagnosis not present

## 2020-11-13 DIAGNOSIS — R079 Chest pain, unspecified: Secondary | ICD-10-CM | POA: Insufficient documentation

## 2020-11-13 DIAGNOSIS — I2510 Atherosclerotic heart disease of native coronary artery without angina pectoris: Secondary | ICD-10-CM | POA: Diagnosis not present

## 2020-11-13 DIAGNOSIS — F14129 Cocaine abuse with intoxication, unspecified: Secondary | ICD-10-CM | POA: Diagnosis present

## 2020-11-13 DIAGNOSIS — F329 Major depressive disorder, single episode, unspecified: Secondary | ICD-10-CM | POA: Diagnosis not present

## 2020-11-13 DIAGNOSIS — F1414 Cocaine abuse with cocaine-induced mood disorder: Secondary | ICD-10-CM | POA: Diagnosis present

## 2020-11-13 LAB — COMPREHENSIVE METABOLIC PANEL
ALT: 17 U/L (ref 0–44)
AST: 23 U/L (ref 15–41)
Albumin: 4.5 g/dL (ref 3.5–5.0)
Alkaline Phosphatase: 81 U/L (ref 38–126)
Anion gap: 12 (ref 5–15)
BUN: 27 mg/dL — ABNORMAL HIGH (ref 6–20)
CO2: 21 mmol/L — ABNORMAL LOW (ref 22–32)
Calcium: 9.1 mg/dL (ref 8.9–10.3)
Chloride: 104 mmol/L (ref 98–111)
Creatinine, Ser: 1.18 mg/dL (ref 0.61–1.24)
GFR, Estimated: >60 mL/min (ref >60)
Glucose, Bld: 118 mg/dL — ABNORMAL HIGH (ref 70–99)
Potassium: 3.5 mmol/L (ref 3.5–5.1)
Sodium: 137 mmol/L (ref 135–145)
Total Bilirubin: 0.9 mg/dL (ref 0.3–1.2)
Total Protein: 8.1 g/dL (ref 6.5–8.1)

## 2020-11-13 LAB — CBC
HCT: 39.2 % (ref 39.0–52.0)
Hemoglobin: 13.9 g/dL (ref 13.0–17.0)
MCH: 33.1 pg (ref 26.0–34.0)
MCHC: 35.5 g/dL (ref 30.0–36.0)
MCV: 93.3 fL (ref 80.0–100.0)
Platelets: 223 10*3/uL (ref 150–400)
RBC: 4.2 MIL/uL — ABNORMAL LOW (ref 4.22–5.81)
RDW: 14.1 % (ref 11.5–15.5)
WBC: 5.6 10*3/uL (ref 4.0–10.5)
nRBC: 0 % (ref 0.0–0.2)

## 2020-11-13 LAB — RESP PANEL BY RT-PCR (FLU A&B, COVID) ARPGX2
Influenza A by PCR: NEGATIVE
Influenza B by PCR: NEGATIVE
SARS Coronavirus 2 by RT PCR: NEGATIVE

## 2020-11-13 LAB — TROPONIN I (HIGH SENSITIVITY)
Troponin I (High Sensitivity): 14 ng/L (ref <18)
Troponin I (High Sensitivity): 13 ng/L (ref <18)

## 2020-11-13 LAB — SALICYLATE LEVEL: Salicylate Lvl: <7 mg/dL — ABNORMAL LOW (ref 7.0–30.0)

## 2020-11-13 LAB — ACETAMINOPHEN LEVEL: Acetaminophen (Tylenol), Serum: <10 ug/mL — ABNORMAL LOW (ref 10–30)

## 2020-11-13 LAB — ETHANOL: Alcohol, Ethyl (B): <10 mg/dL (ref <10)

## 2020-11-13 MED ORDER — ACETAMINOPHEN 500 MG PO TABS
1000.0000 mg | ORAL_TABLET | Freq: Once | ORAL | Status: AC
  Administered 2020-11-13: 1000 mg via ORAL
  Filled 2020-11-13: qty 2

## 2020-11-13 MED ORDER — LORAZEPAM 2 MG/ML IJ SOLN
1.0000 mg | Freq: Once | INTRAMUSCULAR | Status: AC
  Administered 2020-11-13: 1 mg via INTRAVENOUS
  Filled 2020-11-13: qty 1

## 2020-11-13 MED ORDER — ONDANSETRON HCL 4 MG/2ML IJ SOLN
INTRAMUSCULAR | Status: AC
  Filled 2020-11-13: qty 2

## 2020-11-13 MED ORDER — ONDANSETRON 4 MG PO TBDP
4.0000 mg | ORAL_TABLET | Freq: Once | ORAL | Status: AC
  Administered 2020-11-13: 4 mg via ORAL
  Filled 2020-11-13: qty 1

## 2020-11-13 MED ORDER — ONDANSETRON HCL 4 MG/2ML IJ SOLN: 4.0000 mg | Freq: Once | INTRAMUSCULAR | Status: DC

## 2020-11-13 MED ORDER — ASPIRIN 81 MG PO CHEW
324.0000 mg | CHEWABLE_TABLET | Freq: Once | ORAL | Status: AC
  Administered 2020-11-13: 324 mg via ORAL
  Filled 2020-11-13: qty 4

## 2020-11-13 NOTE — ED Provider Notes (Signed)
-----------------------------------------   11:06 AM on 11/13/2020 -----------------------------------------  Patient cleared by psychiatry and IVC rescinded.  He does not represent a threat to himself or others and would like to go home so that he can make his court date.  He was provided with resources by psychiatry for substance abuse.   Chesley Noon, MD 11/13/20 [phone]

## 2020-11-13 NOTE — ED Notes (Signed)
Pt dressing for discharged.  Pt has been given a cab voucher.

## 2020-11-13 NOTE — Discharge Instructions (Signed)
[facility] ° °Non-profit organization in [place], [place] °Address: [address], [place],  [address] °Areas served: [place] and nearby areas °Hours:  °Closed ? Opens 9AM Mon °Phone: [phone] °

## 2020-11-13 NOTE — Consult Note (Signed)
[facility] Psych ED Discharge  11/13/2020 10:24 AM Roy Liter [employer] Sr.  MRN:  [id number] Principal Problem: Cocaine-induced mood disorder [facility]) Discharge Diagnoses: Principal Problem:   Cocaine-induced mood disorder (HCC)  Total Time spent with patient: 45 minutes  Subjective: Patient seen and evaluated by this provider. Patient is a 54 y/o male presenting to the ED after cocaine use with chest pain. During the interview patient states that he is homeless and having a difficult time staying clean at the location where he is currently staying so he can't go back there. At the present time patient states he took $300 worth of crack cocaine and since has experienced suicidal thoughts and chest pain. Patient is requesting to go to an inpatient rehabilitation for 30 days. [facility] a little over a [facility] ago and cannot return there at this time, stay was 7 days. Patient is currently denying any suicidal ideation, homicidal ideation, auditory or visual hallucinations. Per conversation he wants to go back to rehabilitation; therefore, patient will be provided with resources and information for [employer]. Caveat:  Client has court tomorrow and "I know they will excuse me, if I am here."  Psychiatrically stable for discharge.  Per [facility] Note:  TTS reassessment completed. Pt presents calm, alert and oriented x 4. Pt reports to need a place to sleep until he is able to make contact with his DSS SW Monday. Pt reports to be unable to return to his boarding home due to bed bugs, rats and temptation to use drugs. TTS provided and explained shelter resources. Pt reports expressed some apprehension but agreed to follow up with the [facility]. Pt denies any current SI/HI/AH/VH and contracts for safety.    Per Nanine Means, NP pt will be discharged with the recommendation to follow up with resources provided.   Past Psychiatric History: Bipolar Disorder, Depression  Past  Medical History:  Past Medical History:  Diagnosis Date  . AIDS (acquired immune deficiency syndrome) (HCC)   . Arthritis   . Asthma   . Bipolar disorder (HCC)   . Bronchitis   . Complication of anesthesia   . Coronary artery disease   . Depression   . Dysrhythmia    1st degree heart block/ brady  . GERD (gastroesophageal reflux disease)   . Hepatitis C    treated  . HIV (human immunodeficiency virus infection) (HCC)   . HTN (hypertension)   . PONV (postoperative nausea and vomiting)     Past Surgical History:  Procedure Laterality Date  . APPLICATION OF WOUND VAC Right 09/01/2019   Procedure: APPLICATION OF WOUND VAC;  Surgeon: Kennedy Bucker, MD;  Location: [facility] ORS;  Service: Orthopedics;  Laterality: Right;  Serial # [id number]  . HERNIA REPAIR Left    inguinal  . TOE SURGERY    . TOE SURGERY Right   . TOTAL HIP ARTHROPLASTY Right 09/01/2019   Procedure: TOTAL HIP ARTHROPLASTY ANTERIOR APPROACH;  Surgeon: Kennedy Bucker, MD;  Location: [facility] ORS;  Service: Orthopedics;  Laterality: Right;   Family History:  Family History  Problem Relation Age of Onset  . Cancer Brother   . Uterine cancer Mother   . CAD Mother   . Hypertension Mother   . Hyperlipidemia Mother    Family Psychiatric  History: None Social History: Cocaine Use  Social History   Substance and Sexual Activity  Alcohol Use Yes  . Alcohol/week: 84.0 standard drinks  . Types: 84 Cans of beer per week  Comment: daily     Social History   Substance and Sexual Activity  Drug Use Yes  . Types: Cocaine, "Crack" cocaine   Comment: states last use 10/31/2020    Social History   Socioeconomic History  . Marital status: Divorced    Spouse name: Not on file  . Number of children: Not on file  . Years of education: Not on file  . Highest education level: Not on file  Occupational History  . Not on file  Tobacco Use  . Smoking status: Current Every Day Smoker    Packs/day: 0.25    Types: Cigarettes  .  Smokeless tobacco: Never Used  Vaping Use  . Vaping Use: Never used  Substance and Sexual Activity  . Alcohol use: Yes    Alcohol/week: 84.0 standard drinks    Types: 84 Cans of beer per week    Comment: daily  . Drug use: Yes    Types: Cocaine, "Crack" cocaine    Comment: states last use 10/31/2020  . Sexual activity: Yes  Other Topics Concern  . Not on file  Social History Narrative  . Not on file   Social Determinants of Health   Financial Resource Strain:   . Difficulty of Paying Living Expenses: Not on file  Food Insecurity:   . Worried About Programme researcher, broadcasting/film/video in the Last Year: Not on file  . Ran Out of Food in the Last Year: Not on file  Transportation Needs:   . Lack of Transportation (Medical): Not on file  . Lack of Transportation (Non-Medical): Not on file  Physical Activity:   . Days of Exercise per Week: Not on file  . Minutes of Exercise per Session: Not on file  Stress:   . Feeling of Stress : Not on file  Social Connections:   . Frequency of Communication with Friends and Family: Not on file  . Frequency of Social Gatherings with Friends and Family: Not on file  . Attends Religious Services: Not on file  . Active Member of Clubs or Organizations: Not on file  . Attends Banker Meetings: Not on file  . Marital Status: Not on file    Has this patient used any form of tobacco in the last 30 days? (Cigarettes, Smokeless Tobacco, Cigars, and/or Pipes) A prescription for an FDA-approved tobacco cessation medication was offered at discharge and the patient refused  Current Medications: No current facility-administered medications for this encounter.   Current Outpatient Medications  Medication Sig Dispense Refill  . albuterol (VENTOLIN HFA) 108 (90 Base) MCG/ACT inhaler Inhale 1-2 puffs into the lungs every 4 (four) hours as needed for wheezing or shortness of breath. 18 g 1  . atorvastatin (LIPITOR) 10 MG tablet Take 1 tablet (10 mg total) by  mouth daily at 6 PM. (Patient not taking: Reported on 07/01/2020) 30 tablet 1  . bictegravir-emtricitabine-tenofovir AF (BIKTARVY) 50-200-25 MG TABS tablet Take 1 tablet by mouth daily. (Patient not taking: Reported on 07/01/2020) 30 tablet 1  . citalopram (CELEXA) 40 MG tablet Take 1 tablet (40 mg total) by mouth daily. 30 tablet 1  . hydrOXYzine (ATARAX/VISTARIL) 50 MG tablet Take 1 tablet (50 mg total) by mouth 3 (three) times daily as needed. 30 tablet 0  . mirtazapine (REMERON) 7.5 MG tablet Take 1 tablet (7.5 mg total) by mouth at bedtime. 30 tablet 1  . risperiDONE (RISPERDAL) 1 MG tablet Take 1 tablet (1 mg total) by mouth at bedtime. 30 tablet 1  .  traZODone (DESYREL) 150 MG tablet Take 1 tablet (150 mg total) by mouth at bedtime as needed for sleep. 30 tablet 1   PTA Medications: (Not in a hospital admission)   Musculoskeletal: Strength & Muscle Tone: within normal limits Gait & Station: normal Patient leans: N/A  Psychiatric Specialty Exam: Physical Exam Vitals and nursing note reviewed.  Constitutional:      Appearance: Normal appearance.  HENT:     Head: Normocephalic.     Nose: Nose normal.  Cardiovascular:     Rate and Rhythm: Normal rate.  Pulmonary:     Effort: Pulmonary effort is normal.  Musculoskeletal:        General: Normal range of motion.     Cervical back: Normal range of motion.  Neurological:     General: No focal deficit present.     Mental Status: He is alert and oriented to person, place, and time.  Psychiatric:        Attention and Perception: Attention and perception normal.        Mood and Affect: Mood is anxious.        Speech: Speech normal.        Behavior: Behavior normal. Behavior is cooperative.        Thought Content: Thought content normal.        Cognition and Memory: Cognition and memory normal.        Judgment: Judgment normal.     Review of Systems  Psychiatric/Behavioral: The patient is nervous/anxious.   All other systems  reviewed and are negative.   Blood pressure 111/80, pulse (!) 51, temperature 98.8 F (37.1 C), resp. rate 14, height 5\' 9"  (1.753 m), weight 74.8 kg, SpO2 98 %.Body mass index is 24.37 kg/m.  General Appearance: Casual  Eye Contact:  Good  Speech:  Clear and Coherent and Normal Rate  Volume:  Normal  Mood:  Anxious  Affect:  Appropriate and Congruent  Thought Process:  Coherent, Goal Directed and Linear  Orientation:  Full (Time, Place, and Person)  Thought Content:  WDL and Logical  Suicidal Thoughts:  No  Homicidal Thoughts:  No  Memory:  Immediate;   Good Recent;   Good Remote;   Good  Judgement:  Good  Insight:  Good  Psychomotor Activity:  Normal  Concentration:  Concentration: Good and Attention Span: Good  Recall:  Good  Fund of Knowledge:  Good  Language:  Good  Akathisia:  No  Handed:  Right  AIMS (if indicated):     Assets:  Communication Skills Desire for Improvement Resilience  ADL's:  Intact  Cognition:  WNL  Sleep:        Demographic Factors:  Male, Low socioeconomic status and Unemployed  Loss Factors: Financial problems/change in socioeconomic status  Historical Factors: Impulsivity  Risk Reduction Factors:   Positive coping skills or problem solving skills  Continued Clinical Symptoms:  Anxiety, mild  Cognitive Features That Contribute To Risk:  None    Suicide Risk:  Minimal: No identifiable suicidal ideation.  Patients presenting with no risk factors but with morbid ruminations; may be classified as minimal risk based on the severity of the depressive symptoms    Plan Of Care/Follow-up recommendations:  Cocaine-induced mood disorder  -Refrain from alcohol and drug use -Attend 12-step program with a sponsor -recommend follow up with outpatient resources  -- Patient given resources for .   Disposition:  Patient will be discharge with outpatient resources.  [employer], NP 11/13/2020, 10:24  AM

## 2020-11-13 NOTE — ED Triage Notes (Signed)
Pt BIB EMS. Pt states he took $300 worth of crack cocaine the last 2 days and is now having left sided chest pain and suicidal thoughts. Pt states that he wanted to walk out in front of traffic.

## 2020-11-13 NOTE — ED Notes (Addendum)
EKG was completed in triage. Given to provider. Pt taken back to room 2. Primary RN and provider aware.  Pt has not been dressed out at this time. Provider wanted pt to come back as soon as was able. Primary RN aware

## 2020-11-13 NOTE — BH Assessment (Addendum)
TTS reassessment completed. Pt presents calm, alert and oriented x 4. Pt reports to need a place to sleep until he is able to make contact with his DSS SW Monday. Pt reports to be unable to return to his boarding home due to bed bugs, rats and temptation to use drugs. TTS provided and explained shelter resources. Pt reports expressed some apprehension but agreed to follow up with the [facility]. Pt denies any current SI/HI/AH/VH and contracts for safety.    Per Nanine Means, NP pt will be discharged with the recommendation to follow up with resources provided.

## 2020-11-13 NOTE — BH Assessment (Signed)
Assessment Note  Roy Calvey Sr. is an 54 y.o. male presenting to [facility] ED initially voluntary but has since been IVC'd by Attending ER doctor. Per triage note Pt BIB EMS. Pt states he took $300 worth of crack cocaine the last 2 days and is now having left sided chest pain and suicidal thoughts. Pt states that he wanted to walk out in front of traffic. During assessment patient appears alert and oriented x4, calm and cooperative, depressed and sad. Patient continues to report SI with a plan "to walk out in front of a car." Patient also reports "I can't get off these drugs." Patient reports using Cocaine via smoking "$300 worth." Patient was just recently discharged from [facility] for similar presentation, when asked if patient did any Outpatient follow up after his discharge patient reported "I went to [facility] and did treatment there and was discharged not long ago." Patient reports after leaving [facility] he relapsed and has been unable to follow up with Outpatient treatment. Patient continues to report SI, denies HI/AH/VH and does not appear to be responding to any internal or external stimuli.  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 AIDS (acquired immune deficiency syndrome) (HCC)   . Arthritis   . Asthma   . Bipolar disorder (HCC)   . Bronchitis   . Complication of anesthesia   . Coronary artery disease   . Depression   . Dysrhythmia    1st degree heart block/ brady  . GERD (gastroesophageal reflux disease)   . Hepatitis C    treated  . HIV (human immunodeficiency virus infection) (HCC)   . HTN (hypertension)   . PONV (postoperative nausea and vomiting)     Past Surgical History:  Procedure Laterality Date  . APPLICATION OF WOUND VAC Right 09/01/2019   Procedure: APPLICATION OF WOUND VAC;  Surgeon: Kennedy Bucker, MD;  Location: [facility] ORS;  Service: Orthopedics;  Laterality:  Right;  Serial # [id number]  . HERNIA REPAIR Left    inguinal  . TOE SURGERY    . TOE SURGERY Right   . TOTAL HIP ARTHROPLASTY Right 09/01/2019   Procedure: TOTAL HIP ARTHROPLASTY ANTERIOR APPROACH;  Surgeon: Kennedy Bucker, MD;  Location: [facility] ORS;  Service: Orthopedics;  Laterality: Right;    Family History:  Family History  Problem Relation Age of Onset  . Cancer Brother   . Uterine cancer Mother   . CAD Mother   . Hypertension Mother   . Hyperlipidemia Mother     Social History:  reports that he has been smoking cigarettes. He has been smoking about 0.25 packs per day. He has never used smokeless tobacco. He reports current alcohol use of about 84.0 standard drinks of alcohol per week. He reports current drug use. Drugs: Cocaine and "Crack" cocaine.  Additional Social History:  Alcohol / Drug Use Pain Medications: See MAR Prescriptions: See MAR Over the Counter: See MAR History of alcohol / drug use?: Yes Substance #1 Name of Substance 1: Cocaine  CIWA: CIWA-Ar BP: 111/80 Pulse Rate: (!) 51 COWS:    Allergies:  Allergies  Allergen Reactions  . Amlodipine Swelling    Of the tongue  . Lisinopril Swelling  . Lactose Other (See Comments)    GI distress  . Pollen Extract Other (See Comments)    Itchy eyes and runny nose    Home Medications: (Not in a hospital admission)   OB/GYN Status:  No LMP for male patient.  General Assessment Data Location of Assessment: [facility] ED TTS Assessment: In system Is this a Tele or Face-to-Face Assessment?: Face-to-Face Is this an Initial Assessment or a Re-assessment for this encounter?: Initial Assessment Patient Accompanied by:: N/A Language Other than English: No Living Arrangements: Homeless/Shelter What gender do you identify as?: Male Marital status: Single Pregnancy Status: No Living Arrangements: Other (Comment) Can pt return to current living arrangement?: Yes Admission Status: Involuntary Petitioner: ED  Attending Is patient capable of signing voluntary admission?: No Referral Source: Other Insurance type: Medicaid  Medical Screening Exam [facility] Walk-in ONLY) Medical Exam completed: Yes  Crisis Care Plan Living Arrangements: Other (Comment) Legal Guardian: Other: (self) Name of Psychiatrist: None Name of Therapist: None  Education Status Is patient currently in school?: No Is the patient employed, unemployed or receiving disability?: Unemployed, Receiving disability income  Risk to self with the past 6 months Suicidal Ideation: Yes-Currently Present Has patient been a risk to self within the past 6 months prior to admission? : Yes Suicidal Intent: Yes-Currently Present Has patient had any suicidal intent within the past 6 months prior to admission? : Yes Is patient at risk for suicide?: Yes Suicidal Plan?: Yes-Currently Present Has patient had any suicidal plan within the past 6 months prior to admission? : Yes Specify Current Suicidal Plan: "To walk in front of a car" Access to Means: Yes Specify Access to Suicidal Means: Access to roads What has been your use of drugs/alcohol within the last 12 months?: Cocaine Previous Attempts/Gestures: Yes How many times?: 1 Other Self Harm Risks: None Triggers for Past Attempts: Unpredictable Intentional Self Injurious Behavior: None Family Suicide History: No Recent stressful life event(s): Financial Problems Persecutory voices/beliefs?: No Depression: Yes Depression Symptoms: Isolating, Fatigue, Loss of interest in usual pleasures, Feeling worthless/self pity Substance abuse history and/or treatment for substance abuse?: Yes Suicide prevention information given to non-admitted patients: Not applicable  Risk to Others within the past 6 months Homicidal Ideation: No Does patient have any lifetime risk of violence toward others beyond the six months prior to admission? : No Thoughts of Harm to Others: No Current Homicidal Intent:  No Current Homicidal Plan: No Access to Homicidal Means: No Identified Victim: None History of harm to others?: No Assessment of Violence: None Noted Violent Behavior Description: None Does patient have access to weapons?: No Criminal Charges Pending?: Yes Describe Pending Criminal Charges: Pt has shop lifting charges Does patient have a court date: Yes Court Date: 11/14/20 Is patient on probation?: No  Psychosis Hallucinations: None noted Delusions: None noted  Mental Status Report Appearance/Hygiene: In scrubs, Disheveled Eye Contact: Poor Motor Activity: Freedom of movement Speech: Logical/coherent Level of Consciousness: Alert Mood: Depressed, Guilty, Sad Affect: Appropriate to circumstance Anxiety Level: None Thought Processes: Coherent Judgement: Unimpaired Orientation: Person, Place, Time, Situation, Appropriate for developmental age Obsessive Compulsive Thoughts/Behaviors: None  Cognitive Functioning Concentration: Normal Memory: Recent Intact, Remote Intact Is patient IDD: No Insight: Poor Impulse Control: Fair Appetite: Good Have you had any weight changes? : No Change Sleep: Decreased Total Hours of Sleep: 0 Vegetative Symptoms: None  ADLScreening [facility] Assessment Services) [name] cognitive ability adequate to safely complete daily activities?: Yes Patient able to express need for assistance with ADLs?: No Independently performs ADLs?: Yes (appropriate for developmental age)  Prior Inpatient Therapy Prior Inpatient Therapy: Yes Prior Therapy Dates: 2017, 2018, 2019, 2020, 2021 Prior Therapy Facilty/Provider(s): [facility], [facility], [facility] Reason for Treatment: Bipolar Depression; SI  Prior Outpatient Therapy  Prior Outpatient Therapy: No Does patient have an ACCT team?: No Does patient have Intensive In-House Services?  : No Does patient have [facility] services? : No Does patient have P4CC services?: No  ADL Screening (condition at time of  admission) [name] cognitive ability adequate to safely complete daily activities?: Yes Is the patient deaf or have difficulty hearing?: No Does the patient have difficulty seeing, even when wearing glasses/contacts?: No Does the patient have difficulty concentrating, remembering, or making decisions?: No Patient able to express need for assistance with ADLs?: No Does the patient have difficulty dressing or bathing?: Yes Independently performs ADLs?: Yes (appropriate for developmental age) Does the patient have difficulty walking or climbing stairs?: No Weakness of Legs: None Weakness of Arms/Hands: None  Home Assistive Devices/Equipment Home Assistive Devices/Equipment: None  Therapy Consults (therapy consults require a physician order) PT Evaluation Needed: No OT Evalulation Needed: No SLP Evaluation Needed: No Abuse/Neglect Assessment (Assessment to be complete while patient is alone) Abuse/Neglect Assessment Can Be Completed: Yes Physical Abuse: Denies Verbal Abuse: Denies Sexual Abuse: Denies Exploitation of patient/patient's resources: Denies Self-Neglect: Denies Values / Beliefs Cultural Requests During Hospitalization: None Spiritual Requests During Hospitalization: None Consults Spiritual Care Consult Needed: No Transition of Care Team Consult Needed: No Advance Directives (For Healthcare) Does Patient Have a Medical Advance Directive?: No          Disposition: Disposition pending, patient to be seen by Psyc Provider Disposition Initial Assessment Completed for this Encounter: Yes  On Site Evaluation by:   Reviewed with Physician:    Benay Pike MS LCASA 11/13/2020 5:40 AM

## 2020-11-13 NOTE — ED Provider Notes (Signed)
[facility] Emergency Department Provider Note  ____________________________________________  Time seen: Approximately 2:50 AM  I have reviewed the triage vital signs and the nursing notes.   HISTORY  Chief Complaint Suicidal   HPI Roy Lipa Sr. is a 54 y.o. male history of HIV on antiretroviral therapy, bipolar disorder, cocaine abuse, hypertension who presents for evaluation of chest pain.  Patient reports smoking $300 worth of crack cocaine over the last 2 days.  Last usage was an hour ago.  As soon as he last used he started developing chest pain.  He describes the pain as an electric shock located on the left side of his chest, 8 out of 10, constant and nonradiating.  Mild shortness of breath associated with it.  He denies any known history of coronary artery disease, no personal or family history of PE or DVT, no recent travel immobilization, no leg pain or swelling, no hemoptysis or exogenous hormones, the pain is not pleuritic in nature.  He denies abdominal pain, nausea, vomiting, back pain, dizziness.  Patient also endorses suicidal thoughts with a plan to walk out in front of traffic.  Past Medical History:  Diagnosis Date  . AIDS (acquired immune deficiency syndrome) (HCC)   . Arthritis   . Asthma   . Bipolar disorder (HCC)   . Bronchitis   . Complication of anesthesia   . Coronary artery disease   . Depression   . Dysrhythmia    1st degree heart block/ brady  . GERD (gastroesophageal reflux disease)   . Hepatitis C    treated  . HIV (human immunodeficiency virus infection) (HCC)   . HTN (hypertension)   . PONV (postoperative nausea and vomiting)     Patient Active Problem List   Diagnosis Date Noted  . Severe recurrent major depression without psychotic features (HCC) 10/18/2020  . Allergic rhinitis 06/21/2020  . GERD (gastroesophageal reflux disease) 06/21/2020  . 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  . Bipolar disorder (manic  depression) (HCC) 05/18/2020  . Adjustment disorder with mixed anxiety and depressed mood 05/18/2020  . Arthritis pain, hip 05/18/2020  . 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 Angioedema 07/24/2019  . MDD (major depressive disorder), recurrent episode (HCC) 03/23/2019  . MDD (major depressive disorder), recurrent episode, moderate (HCC) 02/19/2019  . MDD (major depressive disorder), recurrent severe, without psychosis (HCC) 02/19/2019  . Chest pain 05/27/2018  . Major depressive disorder, recurrent severe without psychotic features (HCC) 05/27/2018  . ARF (acute renal failure) (HCC) 04/08/2018  . Malingering 03/07/2017  . Cocaine dependence (HCC) 02/19/2017  . Substance induced mood disorder (HCC) 02/19/2017  . Alcohol use disorder, severe, dependence (HCC) 12/27/2016  . HIV disease (HCC) 10/26/2016  . HTN (hypertension) 10/26/2016  . Dyslipidemia 10/26/2016  . BPH (benign prostatic hyperplasia) 10/26/2016  . Constipation 10/26/2016  . Acquired hallux rigidus of right foot 07/16/2016  . Arthritis 10/25/2014  . Genital warts 08/25/2013    Past Surgical History:  Procedure Laterality Date  . APPLICATION OF WOUND VAC Right 09/01/2019   Procedure: APPLICATION OF WOUND VAC;  Surgeon: Kennedy Bucker, MD;  Location: [facility] ORS;  Service: Orthopedics;  Laterality: Right;  Serial # [id number]  . HERNIA REPAIR Left    inguinal  . TOE SURGERY    . TOE SURGERY Right   . TOTAL HIP ARTHROPLASTY Right 09/01/2019   Procedure: TOTAL HIP ARTHROPLASTY ANTERIOR APPROACH;  Surgeon: Kennedy Bucker, MD;  Location: [facility] ORS;  Service: Orthopedics;  Laterality: Right;  Prior to Admission medications   Medication Sig Start Date End Date Taking? Authorizing Provider  albuterol (VENTOLIN HFA) 108 (90 Base) MCG/ACT inhaler Inhale 1-2 puffs into the lungs every 4 (four) hours as needed for wheezing or shortness of breath. 05/23/20   Clapacs, Jackquline Denmark, MD  atorvastatin (LIPITOR) 10 MG tablet Take 1  tablet (10 mg total) by mouth daily at 6 PM. Patient not taking: Reported on 07/01/2020 05/23/20   Clapacs, Jackquline Denmark, MD  bictegravir-emtricitabine-tenofovir AF (BIKTARVY) 50-200-25 MG TABS tablet Take 1 tablet by mouth daily. Patient not taking: Reported on 07/01/2020 05/24/20   Clapacs, Jackquline Denmark, MD  citalopram (CELEXA) 40 MG tablet Take 1 tablet (40 mg total) by mouth daily. 11/02/20   Jesse Sans, MD  hydrOXYzine (ATARAX/VISTARIL) 50 MG tablet Take 1 tablet (50 mg total) by mouth 3 (three) times daily as needed. 11/02/20   Jesse Sans, MD  mirtazapine (REMERON) 7.5 MG tablet Take 1 tablet (7.5 mg total) by mouth at bedtime. 11/02/20   Jesse Sans, MD  risperiDONE (RISPERDAL) 1 MG tablet Take 1 tablet (1 mg total) by mouth at bedtime. 11/02/20   Jesse Sans, MD  traZODone (DESYREL) 150 MG tablet Take 1 tablet (150 mg total) by mouth at bedtime as needed for sleep. 11/02/20   Jesse Sans, MD    Allergies Amlodipine, Lisinopril, Lactose, and Pollen extract  Family History  Problem Relation Age of Onset  . Cancer Brother   . Uterine cancer Mother   . CAD Mother   . Hypertension Mother   . Hyperlipidemia Mother     Social History Social History   Tobacco Use  . Smoking status: Current Every Day Smoker    Packs/day: 0.25    Types: Cigarettes  . Smokeless tobacco: Never Used  Vaping Use  . Vaping Use: Never used  Substance Use Topics  . Alcohol use: Yes    Alcohol/week: 84.0 standard drinks    Types: 84 Cans of beer per week    Comment: daily  . Drug use: Yes    Types: Cocaine, "Crack" cocaine    Comment: states last use 10/31/2020    Review of Systems  Constitutional: Negative for fever. Eyes: Negative for visual changes. ENT: Negative for sore throat. Neck: No neck pain  Cardiovascular: + chest pain. Respiratory: Negative for shortness of breath. Gastrointestinal: Negative for abdominal pain, vomiting or diarrhea. Genitourinary: Negative for  dysuria. Musculoskeletal: Negative for back pain. Skin: Negative for rash. Neurological: Negative for headaches, weakness or numbness. Psych: + depression and SI. No HI  ____________________________________________   PHYSICAL EXAM:  VITAL SIGNS: ED Triage Vitals  Enc Vitals Group     BP 11/13/20 0212 (!) 131/98     Pulse Rate 11/13/20 0212 66     Resp 11/13/20 0212 20     Temp 11/13/20 0212 98.8 F (37.1 C)     Temp src --      SpO2 11/13/20 0212 98 %     Weight 11/13/20 0213 165 lb (74.8 kg)     Height 11/13/20 0213 5\' 9"  (1.753 m)     Head Circumference --      Peak Flow --      Pain Score 11/13/20 0213 Asleep     Pain Loc --      Pain Edu? --      Excl. in GC? --     Constitutional: Alert and oriented. Well appearing and in no apparent distress. HEENT:  Head: Normocephalic and atraumatic.         Eyes: Conjunctivae are normal. Sclera is non-icteric.       Mouth/Throat: Mucous membranes are moist.       Neck: Supple with no signs of meningismus. Cardiovascular: Regular rate and rhythm. No murmurs, gallops, or rubs. 2+ symmetrical distal pulses are present in all extremities. No JVD. Respiratory: Normal respiratory effort. Lungs are clear to auscultation bilaterally. No wheezes, crackles, or rhonchi.  Gastrointestinal: Soft, non tender, and non distended. Musculoskeletal: No edema, cyanosis, or erythema of extremities. Neurologic: Normal speech and language. Face is symmetric. Moving all extremities. No gross focal neurologic deficits are appreciated. Skin: Skin is warm, dry and intact. No rash noted. Psychiatric: Mood and affect are normal. Speech and behavior are normal.  ____________________________________________   LABS (all labs ordered are listed, but only abnormal results are displayed)  Labs Reviewed  COMPREHENSIVE METABOLIC PANEL - Abnormal; Notable for the following components:      Result Value   CO2 21 (*)    Glucose, Bld 118 (*)    BUN 27 (*)     All other components within normal limits  CBC - Abnormal; Notable for the following components:   RBC 4.20 (*)    All other components within normal limits  ACETAMINOPHEN LEVEL - Abnormal; Notable for the following components:   Acetaminophen (Tylenol), Serum <10 (*)    All other components within normal limits  SALICYLATE LEVEL - Abnormal; Notable for the following components:   Salicylate Lvl <7.0 (*)    All other components within normal limits  RESP PANEL BY RT-PCR (FLU A&B, COVID) ARPGX2  ETHANOL  URINE DRUG SCREEN, QUALITATIVE ([facility] ONLY)  TROPONIN I (HIGH SENSITIVITY)  TROPONIN I (HIGH SENSITIVITY)   ____________________________________________  EKG  ED ECG REPORT I, Nita Sickle, the attending physician, personally viewed and interpreted this ECG.  02:13 AM -sinus rhythm with occasional PVCs, rate of 60, normal intervals, minimal anterior elevation with no reciprocal changes.  02:45AM -sinus rhythm, rate of 57, normal intervals, minimal ST elevations no longer observed. ____________________________________________  RADIOLOGY  I have personally reviewed the images performed during this visit and I agree with the Radiologist's read.   Interpretation by Radiologist:  DG Chest Portable 1 View  Result Date: 11/13/2020 CLINICAL DATA:  Left-sided chest pain and suicidal thoughts after crack use over the last 2 days. EXAM: PORTABLE CHEST 1 VIEW COMPARISON:  10/31/2020 FINDINGS: The heart size and mediastinal contours are within normal limits. Both lungs are clear. The visualized skeletal structures are unremarkable. IMPRESSION: No active disease. Electronically Signed   By: Burman Nieves M.D.   On: 11/13/2020 03:29      ____________________________________________   PROCEDURES  Procedure(s) performed:yes .1-3 Lead EKG Interpretation Performed by: Nita Sickle, MD Authorized by: Nita Sickle, MD     Interpretation: non-specific     ECG rate  assessment: normal     Rhythm: sinus rhythm     Ectopy: PVCs     Critical Care performed:  None ____________________________________________   INITIAL IMPRESSION / ASSESSMENT AND PLAN / ED COURSE   54 y.o. male history of HIV on antiretroviral therapy, bipolar disorder, cocaine abuse, hypertension who presents for evaluation of chest pain and SI with plan.  When patient arrived on triage she is EKG was slightly concerning with minimal anterior ST elevations.  Therefore patient was brought immediately back to the room.  The repeat EKG was done showing resolution of those concerning findings.  Patient  is describing a shocklike pain located on the left side of his chest which is nonpleuritic in nature.  Patient has smoked a large amount of crack cocaine over the last 2 days.  He is actively suicidal with the plan.  Differential diagnoses including demand ischemia in the setting of crack cocaine versus ACS versus pneumothorax versus aspiration pneumonia versus dissection. PERC negative  Will give ASA and IV ativan for cocaine induced CP. Will get labs including HS-trop x 2, will get CXR. Patient meets criteria for IVC which was taken by me. Patient placed on monitor for close monitoring.  Old medical records reviewed including most recent echo from 2020 and most recent psych admission 12 days ago.  _________________________ 4:50 AM on 11/13/2020 -----------------------------------------  CP free. 2 x HS-trop negative.  Labs otherwise with no acute abnormalities.  Chest x-ray visualized by me with no evidence of pneumothorax.  With resolved symptoms no significantly elevated blood pressure and normal mediastinum silhouette on chest x-ray, low suspicion for dissection.  Patient is now medically cleared awaiting psychiatric evaluation.  The patient has been placed in psychiatric observation due to the need to provide a safe environment for the patient while obtaining psychiatric consultation and  evaluation, as well as ongoing medical and medication management to treat the patient's condition.  The patient has been placed under full IVC at this time.     _____________________________________________ Please note:  Patient was evaluated in Emergency Department today for the symptoms described in the history of present illness. Patient was evaluated in the context of the global COVID-19 pandemic, which necessitated consideration that the patient might be at risk for infection with the SARS-CoV-2 virus that causes COVID-19. Institutional protocols and algorithms that pertain to the evaluation of patients at risk for COVID-19 are in a state of rapid change based on information released by regulatory bodies including the CDC and federal and state organizations. These policies and algorithms were followed during the patient's care in the ED.  Some ED evaluations and interventions may be delayed as a result of limited staffing during the pandemic.   [place] Controlled Substance Database was reviewed by me. ____________________________________________   FINAL CLINICAL IMPRESSION(S) / ED DIAGNOSES   Final diagnoses:  Suicidal ideation  Cocaine abuse with intoxication (HCC)  Chest pain, unspecified type      NEW MEDICATIONS STARTED DURING THIS VISIT:  ED Discharge Orders    None       Note:  This document was prepared using Dragon voice recognition software and may include unintentional dictation errors.    Nita Sickle, MD 11/13/20 [phone]

## 2020-11-13 NOTE — ED Notes (Signed)
Pt discharged to boarding house wiith cab voucher. VS stable. All belongings returned to patient. Discharge instructions reviwewed with pt.

## 2020-11-13 NOTE — ED Notes (Addendum)
Pt will be discharged with resources provided by TTS.  Pt is his own legal guardian.

## 2020-11-13 NOTE — ED Notes (Signed)
Pt given breakfast tray

## 2020-11-13 NOTE — ED Notes (Signed)
Pt states he has pain in his legs and hips from arthritis. EDP made aware.  Medication given as ordered.

## 2020-11-16 ENCOUNTER — Emergency Department: Payer: Medicaid Other

## 2020-11-16 ENCOUNTER — Other Ambulatory Visit: Payer: Self-pay

## 2020-11-16 ENCOUNTER — Emergency Department
Admission: EM | Admit: 2020-11-16 | Discharge: 2020-11-16 | Disposition: A | Discharge status: Home / Self Care | Payer: Medicaid Other | Attending: Emergency Medicine | Admitting: Emergency Medicine

## 2020-11-16 ENCOUNTER — Encounter: Payer: Self-pay | Admitting: Emergency Medicine

## 2020-11-16 DIAGNOSIS — M79671 Pain in right foot: Secondary | ICD-10-CM

## 2020-11-16 DIAGNOSIS — I10 Essential (primary) hypertension: Secondary | ICD-10-CM | POA: Diagnosis not present

## 2020-11-16 DIAGNOSIS — Z96641 Presence of right artificial hip joint: Secondary | ICD-10-CM | POA: Insufficient documentation

## 2020-11-16 DIAGNOSIS — I2510 Atherosclerotic heart disease of native coronary artery without angina pectoris: Secondary | ICD-10-CM | POA: Insufficient documentation

## 2020-11-16 DIAGNOSIS — J45909 Unspecified asthma, uncomplicated: Secondary | ICD-10-CM | POA: Diagnosis not present

## 2020-11-16 DIAGNOSIS — F17210 Nicotine dependence, cigarettes, uncomplicated: Secondary | ICD-10-CM | POA: Insufficient documentation

## 2020-11-16 MED ORDER — KETOROLAC TROMETHAMINE 30 MG/ML IJ SOLN
30.0000 mg | Freq: Once | INTRAMUSCULAR | Status: AC
  Administered 2020-11-16: 30 mg via INTRAMUSCULAR
  Filled 2020-11-16: qty 1

## 2020-11-16 MED ORDER — IBUPROFEN 800 MG PO TABS: 800.0000 mg | ORAL_TABLET | Freq: Three times a day (TID) | ORAL | 0 refills | Status: AC

## 2020-11-16 NOTE — ED Triage Notes (Signed)
Pt arrived via [facility] from [place] on [address] with reports of R foot pain that started yesterday, pt c/o swelling and stabbing/shooting pain.  Pt does a lot of walking, not sure if he twisted it the wrong way, pt also states he has hx of arthritis.

## 2020-11-16 NOTE — ED Provider Notes (Signed)
[facility] Emergency Department Provider Note  ____________________________________________   First MD Initiated Contact with Patient 11/16/20 [phone]     (approximate)  I have reviewed the triage vital signs and the nursing notes.   HISTORY  Chief Complaint Foot Pain    HPI Roy Radu Sr. is a 54 y.o. male with medical history as listed below who presents for evaluation of pain that started yesterday in his right foot.  He has had a little bit of swelling.  Is worse with weightbearing and better with rest.  He does a lot of walking and is not sure if he had an injury but he cannot remember any specific injury or fall.  He is able to walk it just hurts to do so.  He does not have a history of gout.  He has had problems with his foot in the past.  He also says that he has arthritis "everywhere in my body".  He has not tried anything in particular nothing in particular makes it better.  He denies fever, sore throat, chest pain, shortness of breath, nausea, vomiting, and abdominal pain.         Past Medical History:  Diagnosis Date  . AIDS (acquired immune deficiency syndrome) (HCC)   . Arthritis   . Asthma   . Bipolar disorder (HCC)   . Bronchitis   . Complication of anesthesia   . Coronary artery disease   . Depression   . Dysrhythmia    1st degree heart block/ brady  . GERD (gastroesophageal reflux disease)   . Hepatitis C    treated  . HIV (human immunodeficiency virus infection) (HCC)   . HTN (hypertension)   . PONV (postoperative nausea and vomiting)     Patient Active Problem List   Diagnosis Date Noted  . Cocaine-induced mood disorder (HCC) 11/13/2020  . Allergic rhinitis 06/21/2020  . GERD (gastroesophageal reflux disease) 06/21/2020  . 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  . Arthritis pain, hip 05/18/2020  . 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 Angioedema 07/24/2019  . Chest pain 05/27/2018  . ARF (acute renal  failure) (HCC) 04/08/2018  . Malingering 03/07/2017  . Cocaine dependence (HCC) 02/19/2017  . Substance induced mood disorder (HCC) 02/19/2017  . Alcohol use disorder, severe, dependence (HCC) 12/27/2016  . HIV disease (HCC) 10/26/2016  . HTN (hypertension) 10/26/2016  . Dyslipidemia 10/26/2016  . BPH (benign prostatic hyperplasia) 10/26/2016  . Constipation 10/26/2016  . Acquired hallux rigidus of right foot 07/16/2016  . Arthritis 10/25/2014  . Genital warts 08/25/2013    Past Surgical History:  Procedure Laterality Date  . APPLICATION OF WOUND VAC Right 09/01/2019   Procedure: APPLICATION OF WOUND VAC;  Surgeon: Kennedy Bucker, MD;  Location: [facility] ORS;  Service: Orthopedics;  Laterality: Right;  Serial # [id number]  . HERNIA REPAIR Left    inguinal  . TOE SURGERY    . TOE SURGERY Right   . TOTAL HIP ARTHROPLASTY Right 09/01/2019   Procedure: TOTAL HIP ARTHROPLASTY ANTERIOR APPROACH;  Surgeon: Kennedy Bucker, MD;  Location: [facility] ORS;  Service: Orthopedics;  Laterality: Right;    Prior to Admission medications   Medication Sig Start Date End Date Taking? Authorizing Provider  albuterol (VENTOLIN HFA) 108 (90 Base) MCG/ACT inhaler Inhale 1-2 puffs into the lungs every 4 (four) hours as needed for wheezing or shortness of breath. 05/23/20   Clapacs, Jackquline Denmark, MD  citalopram (CELEXA) 40 MG tablet Take 1 tablet (40 mg total)  by mouth daily. 11/02/20   Jesse Sans, MD  hydrOXYzine (ATARAX/VISTARIL) 50 MG tablet Take 1 tablet (50 mg total) by mouth 3 (three) times daily as needed. 11/02/20   Jesse Sans, MD  ibuprofen (ADVIL) 800 MG tablet Take 1 tablet (800 mg total) by mouth 3 (three) times daily for 5 days. Take 1 tablet by mouth three times daily with meals 11/16/20 11/21/20  Loleta Rose, MD  mirtazapine (REMERON) 7.5 MG tablet Take 1 tablet (7.5 mg total) by mouth at bedtime. 11/02/20   Jesse Sans, MD  risperiDONE (RISPERDAL) 1 MG tablet Take 1 tablet (1 mg total) by mouth at  bedtime. 11/02/20   Jesse Sans, MD  traZODone (DESYREL) 150 MG tablet Take 1 tablet (150 mg total) by mouth at bedtime as needed for sleep. 11/02/20   Jesse Sans, MD    Allergies Amlodipine, Lisinopril, Lactose, and Pollen extract  Family History  Problem Relation Age of Onset  . Cancer Brother   . Uterine cancer Mother   . CAD Mother   . Hypertension Mother   . Hyperlipidemia Mother     Social History Social History   Tobacco Use  . Smoking status: Current Every Day Smoker    Packs/day: 0.25    Types: Cigarettes  . Smokeless tobacco: Never Used  Vaping Use  . Vaping Use: Never used  Substance Use Topics  . Alcohol use: Yes    Alcohol/week: 84.0 standard drinks    Types: 84 Cans of beer per week    Comment: daily  . Drug use: Yes    Types: Cocaine, "Crack" cocaine    Comment: states last use 10/31/2020    Review of Systems Constitutional: No fever/chills Cardiovascular: Denies chest pain. Respiratory: Denies shortness of breath. Gastrointestinal: No abdominal pain.  No nausea, no vomiting.   Musculoskeletal: Right foot pain. Integumentary: Negative for rash. Neurological: Negative for headaches, focal weakness or numbness.   ____________________________________________   PHYSICAL EXAM:  VITAL SIGNS: ED Triage Vitals  Enc Vitals Group     BP 11/16/20 0311 (!) 143/84     Pulse Rate 11/16/20 0311 67     Resp 11/16/20 0311 18     Temp 11/16/20 0311 98.1 F (36.7 C)     Temp Source 11/16/20 0311 Oral     SpO2 11/16/20 0311 100 %     Weight 11/16/20 0314 74.8 kg (165 lb)     Height 11/16/20 0314 1.753 m (5\' 9" )     Head Circumference --      Peak Flow --      Pain Score 11/16/20 0311 10     Pain Loc --      Pain Edu? --      Excl. in GC? --     Constitutional: Alert and oriented.  Eyes: Conjunctivae are normal.  Head: Atraumatic. Cardiovascular: Normal rate, regular rhythm. Good peripheral circulation. Respiratory: Normal respiratory  effort.  No retractions. Musculoskeletal: Patient has some generalized edema of the right foot when compared to the left.  The tenderness to palpation is mostly of the midfoot.  There is no particular joint effusion including no evidence of podagra.  Ankle is normal in appearance and nontender with range of motion.  Some pain with plantar flexion.  No evidence of cellulitis nor wound/infection. Neurologic:  Normal speech and language. No gross focal neurologic deficits are appreciated.  Skin:  Skin is warm, dry and intact. Psychiatric: Mood and affect are normal. Speech and  behavior are normal.  Patient is polite, calm, and cooperative.  ____________________________________________   LABS (all labs ordered are listed, but only abnormal results are displayed)  Labs Reviewed - No data to display ____________________________________________  EKG  No indication for emergent EKG ____________________________________________  RADIOLOGY I, Loleta Rose, personally viewed and evaluated these images (plain radiographs) as part of my medical decision making, as well as reviewing the written report by the radiologist.  ED MD interpretation: I personally reviewed the patient's imaging and agree with the radiologist's interpretation that there is no evidence of acute fracture or dislocation of the right foot.  Official radiology report(s): DG Foot Complete Right  Result Date: 11/16/2020 CLINICAL DATA:  Right foot pain EXAM: RIGHT FOOT COMPLETE - 3+ VIEW COMPARISON:  None. FINDINGS: There is no evidence of fracture or dislocation. There is no evidence of arthropathy or other focal bone abnormality. Soft tissues are unremarkable. IMPRESSION: Negative. Electronically Signed   By: Deatra Robinson M.D.   On: 11/16/2020 03:32    ____________________________________________   PROCEDURES   Procedure(s) performed (including Critical  Care):  Procedures   ____________________________________________   INITIAL IMPRESSION / MDM / ASSESSMENT AND PLAN / ED COURSE  As part of my medical decision making, I reviewed the following data within the electronic MEDICAL RECORD NUMBER Nursing notes reviewed and incorporated, Radiograph reviewed  and Notes from prior ED visits   Differential diagnosis includes, but is not limited to, sprain, fracture, dislocation, gout, cellulitis or other infection.  Vital signs are stable, afebrile and not tachycardic.  In spite of the patient's psychiatric history, he is currently polite, calm, and cooperative.  His physical exam is notable for some swelling of the right foot that most likely is the result of a sprain or other musculoskeletal strain, but not the result of an acute infection.  I think gout is less likely given that it is not confined to a particular joint and rather seems to be more associated with the midfoot.  I personally reviewed the x-rays and there is no evidence of fracture or dislocation.  I ordered a dose of Toradol 30 mg intramuscular, a hard soled shoe, and crutches.  I counseled him about weightbearing as tolerated, RICE, and orthopedics follow-up.  Given the possibility of gout but also for the analgesic and anti-inflammatory properties, I wrote a prescription for ibuprofen 800 mg 3 times daily x5 days.  Although there is a small risk of interaction with the citalopram, I think the benefit outweighs the small risk of GI bleeding.  The patient understands and agrees with the plan that I laid out and he also understands to return to the ED if he develops any new or worsening symptoms.           ____________________________________________  FINAL CLINICAL IMPRESSION(S) / ED DIAGNOSES  Final diagnoses:  Right foot pain     MEDICATIONS GIVEN DURING THIS VISIT:  Medications  ketorolac (TORADOL) 30 MG/ML injection 30 mg (has no administration in time range)     ED  Discharge Orders         Ordered    ibuprofen (ADVIL) 800 MG tablet  3 times daily        11/16/20 5956          *Please note:  Roy Register Sr. was evaluated in Emergency Department on 11/16/2020 for the symptoms described in the history of present illness. He was evaluated in the context of the global COVID-19 pandemic, which necessitated consideration that  the patient might be at risk for infection with the SARS-CoV-2 virus that causes COVID-19. Institutional protocols and algorithms that pertain to the evaluation of patients at risk for COVID-19 are in a state of rapid change based on information released by regulatory bodies including the CDC and federal and state organizations. These policies and algorithms were followed during the patient's care in the ED.  Some ED evaluations and interventions may be delayed as a result of limited staffing during and after the pandemic.*  Note:  This document was prepared using Dragon voice recognition software and may include unintentional dictation errors.   Loleta Rose, MD 11/16/20 [phone]

## 2020-11-18 ENCOUNTER — Ambulatory Visit: Admit: 2020-11-18 | Discharge: 2020-11-19 | Disposition: A | Discharge status: Home / Self Care | Payer: MEDICAID

## 2020-11-18 ENCOUNTER — Emergency Department: Admit: 2020-11-18 | Discharge: 2020-11-19 | Disposition: A | Discharge status: Home / Self Care | Payer: MEDICAID

## 2020-11-18 DIAGNOSIS — R079 Chest pain, unspecified: Principal | ICD-10-CM

## 2020-11-18 DIAGNOSIS — I10 Essential (primary) hypertension: Principal | ICD-10-CM

## 2020-11-18 DIAGNOSIS — Z21 Asymptomatic human immunodeficiency virus [HIV] infection status: Principal | ICD-10-CM

## 2020-11-18 DIAGNOSIS — F17210 Nicotine dependence, cigarettes, uncomplicated: Principal | ICD-10-CM

## 2020-11-18 DIAGNOSIS — Z20822 Contact with and (suspected) exposure to covid-19: Principal | ICD-10-CM

## 2020-11-18 DIAGNOSIS — E785 Hyperlipidemia, unspecified: Principal | ICD-10-CM

## 2020-11-18 DIAGNOSIS — M1990 Unspecified osteoarthritis, unspecified site: Principal | ICD-10-CM

## 2020-11-18 DIAGNOSIS — R45851 Suicidal ideations: Principal | ICD-10-CM

## 2020-11-18 DIAGNOSIS — J449 Chronic obstructive pulmonary disease, unspecified: Principal | ICD-10-CM

## 2020-11-18 DIAGNOSIS — Z96641 Presence of right artificial hip joint: Principal | ICD-10-CM

## 2020-11-18 DIAGNOSIS — F1410 Cocaine abuse, uncomplicated: Principal | ICD-10-CM

## 2020-12-28 ENCOUNTER — Ambulatory Visit: Admit: 2020-12-28 | Discharge: 2020-12-29 | Payer: MEDICAID

## 2020-12-28 DIAGNOSIS — B20 Human immunodeficiency virus [HIV] disease: Principal | ICD-10-CM

## 2020-12-28 DIAGNOSIS — G629 Polyneuropathy, unspecified: Principal | ICD-10-CM

## 2020-12-28 DIAGNOSIS — I10 Essential (primary) hypertension: Principal | ICD-10-CM

## 2020-12-28 MED ORDER — ATORVASTATIN 40 MG TABLET
ORAL_TABLET | Freq: Every day | ORAL | 4 refills | 90 days | Status: CP
Start: 2020-12-28 — End: 2021-01-11

## 2020-12-28 MED ORDER — AMLODIPINE 5 MG TABLET
ORAL_TABLET | Freq: Every day | ORAL | 11 refills | 30 days | Status: CP
Start: 2020-12-28 — End: 2020-12-28

## 2020-12-28 MED ORDER — BIKTARVY 50 MG-200 MG-25 MG TABLET
ORAL_TABLET | Freq: Every day | ORAL | 11 refills | 30 days | Status: CP
Start: 2020-12-28 — End: 2021-03-28

## 2020-12-28 MED ORDER — HYDROCHLOROTHIAZIDE 12.5 MG TABLET
ORAL_TABLET | Freq: Every day | ORAL | 11 refills | 30.00000 days | Status: CP
Start: 2020-12-28 — End: 2021-01-27

## 2021-01-11 ENCOUNTER — Ambulatory Visit: Admit: 2021-01-11 | Discharge: 2021-01-12 | Payer: MEDICAID

## 2021-01-11 DIAGNOSIS — Z0000 Encounter for general adult medical examination without abnormal findings: Principal | ICD-10-CM

## 2021-01-11 DIAGNOSIS — F332 Major depressive disorder, recurrent severe without psychotic features: Principal | ICD-10-CM

## 2021-01-11 DIAGNOSIS — B20 Human immunodeficiency virus [HIV] disease: Principal | ICD-10-CM

## 2021-01-11 DIAGNOSIS — M25552 Pain in left hip: Principal | ICD-10-CM

## 2021-01-11 DIAGNOSIS — I10 Essential (primary) hypertension: Principal | ICD-10-CM

## 2021-01-11 MED ORDER — DICLOFENAC 1 % TOPICAL GEL
Freq: Four times a day (QID) | TOPICAL | 0 refills | 13.00000 days | Status: CP
Start: 2021-01-11 — End: 2022-01-11

## 2021-01-11 MED ORDER — TRAZODONE 150 MG TABLET
ORAL_TABLET | 1 refills | 0 days | Status: CP
Start: 2021-01-11 — End: ?

## 2021-01-11 MED ORDER — TRAMADOL 50 MG TABLET
ORAL_TABLET | Freq: Three times a day (TID) | ORAL | 0 refills | 7.00000 days | Status: CP | PRN
Start: 2021-01-11 — End: ?

## 2021-01-11 MED ORDER — HYDROCHLOROTHIAZIDE 12.5 MG TABLET
ORAL_TABLET | Freq: Every day | ORAL | 11 refills | 30.00000 days | Status: CP
Start: 2021-01-11 — End: 2022-01-06
  Filled 2021-03-09: qty 30, 30d supply, fill #0

## 2021-01-11 MED ORDER — MIRTAZAPINE 7.5 MG TABLET
ORAL_TABLET | 1 refills | 0 days | Status: CP
Start: 2021-01-11 — End: ?

## 2021-01-11 MED ORDER — ATORVASTATIN 40 MG TABLET
ORAL_TABLET | Freq: Every day | ORAL | 4 refills | 90.00000 days | Status: CP
Start: 2021-01-11 — End: ?

## 2021-01-11 MED ORDER — BUPROPION HCL XL 150 MG 24 HR TABLET, EXTENDED RELEASE
ORAL_TABLET | Freq: Two times a day (BID) | ORAL | 3 refills | 30 days | Status: CP
Start: 2021-01-11 — End: 2021-05-11

## 2021-01-17 DIAGNOSIS — M25552 Pain in left hip: Principal | ICD-10-CM

## 2021-01-23 ENCOUNTER — Other Ambulatory Visit: Payer: Self-pay

## 2021-01-23 ENCOUNTER — Emergency Department
Admission: EM | Admit: 2021-01-23 | Discharge: 2021-01-23 | Disposition: A | Discharge status: Home / Self Care | Payer: No Typology Code available for payment source | Attending: Emergency Medicine | Admitting: Emergency Medicine

## 2021-01-23 DIAGNOSIS — Y900 Blood alcohol level of less than 20 mg/100 ml: Secondary | ICD-10-CM | POA: Insufficient documentation

## 2021-01-23 DIAGNOSIS — F329 Major depressive disorder, single episode, unspecified: Secondary | ICD-10-CM | POA: Insufficient documentation

## 2021-01-23 DIAGNOSIS — F1924 Other psychoactive substance dependence with psychoactive substance-induced mood disorder: Secondary | ICD-10-CM | POA: Diagnosis not present

## 2021-01-23 DIAGNOSIS — B20 Human immunodeficiency virus [HIV] disease: Secondary | ICD-10-CM | POA: Diagnosis not present

## 2021-01-23 DIAGNOSIS — F32A Depression, unspecified: Secondary | ICD-10-CM

## 2021-01-23 DIAGNOSIS — F1010 Alcohol abuse, uncomplicated: Secondary | ICD-10-CM

## 2021-01-23 DIAGNOSIS — F1410 Cocaine abuse, uncomplicated: Secondary | ICD-10-CM | POA: Diagnosis not present

## 2021-01-23 DIAGNOSIS — F1994 Other psychoactive substance use, unspecified with psychoactive substance-induced mood disorder: Secondary | ICD-10-CM | POA: Diagnosis not present

## 2021-01-23 DIAGNOSIS — F17210 Nicotine dependence, cigarettes, uncomplicated: Secondary | ICD-10-CM | POA: Insufficient documentation

## 2021-01-23 DIAGNOSIS — Z20822 Contact with and (suspected) exposure to covid-19: Secondary | ICD-10-CM | POA: Diagnosis not present

## 2021-01-23 DIAGNOSIS — F10129 Alcohol abuse with intoxication, unspecified: Secondary | ICD-10-CM | POA: Insufficient documentation

## 2021-01-23 DIAGNOSIS — I10 Essential (primary) hypertension: Secondary | ICD-10-CM | POA: Diagnosis not present

## 2021-01-23 DIAGNOSIS — Z765 Malingerer [conscious simulation]: Secondary | ICD-10-CM

## 2021-01-23 DIAGNOSIS — R45851 Suicidal ideations: Secondary | ICD-10-CM | POA: Diagnosis not present

## 2021-01-23 DIAGNOSIS — Z96641 Presence of right artificial hip joint: Secondary | ICD-10-CM | POA: Insufficient documentation

## 2021-01-23 DIAGNOSIS — Z79899 Other long term (current) drug therapy: Secondary | ICD-10-CM | POA: Insufficient documentation

## 2021-01-23 DIAGNOSIS — F1420 Cocaine dependence, uncomplicated: Secondary | ICD-10-CM | POA: Diagnosis present

## 2021-01-23 LAB — COMPREHENSIVE METABOLIC PANEL
ALT: 28 U/L (ref 0–44)
AST: 26 U/L (ref 15–41)
Albumin: 3.9 g/dL (ref 3.5–5.0)
Alkaline Phosphatase: 74 U/L (ref 38–126)
Anion gap: 7 (ref 5–15)
BUN: 18 mg/dL (ref 6–20)
CO2: 24 mmol/L (ref 22–32)
Calcium: 9.2 mg/dL (ref 8.9–10.3)
Chloride: 107 mmol/L (ref 98–111)
Creatinine, Ser: 1.17 mg/dL (ref 0.61–1.24)
GFR, Estimated: >60 mL/min (ref >60)
Glucose, Bld: 112 mg/dL — ABNORMAL HIGH (ref 70–99)
Potassium: 4.4 mmol/L (ref 3.5–5.1)
Sodium: 138 mmol/L (ref 135–145)
Total Bilirubin: 0.5 mg/dL (ref 0.3–1.2)
Total Protein: 7.3 g/dL (ref 6.5–8.1)

## 2021-01-23 LAB — RESP PANEL BY RT-PCR (FLU A&B, COVID) ARPGX2
Influenza A by PCR: NEGATIVE
Influenza B by PCR: NEGATIVE
SARS Coronavirus 2 by RT PCR: NEGATIVE

## 2021-01-23 LAB — URINE DRUG SCREEN, QUALITATIVE (ARMC ONLY)
Amphetamines, Ur Screen: NOT DETECTED
Barbiturates, Ur Screen: NOT DETECTED
Benzodiazepine, Ur Scrn: NOT DETECTED
Cannabinoid 50 Ng, Ur ~~LOC~~: NOT DETECTED
Cocaine Metabolite,Ur ~~LOC~~: POSITIVE — AB
MDMA (Ecstasy)Ur Screen: NOT DETECTED
Methadone Scn, Ur: NOT DETECTED
Opiate, Ur Screen: NOT DETECTED
Phencyclidine (PCP) Ur S: NOT DETECTED
Tricyclic, Ur Screen: NOT DETECTED

## 2021-01-23 LAB — CBC
HCT: 38.7 % — ABNORMAL LOW (ref 39.0–52.0)
Hemoglobin: 13.3 g/dL (ref 13.0–17.0)
MCH: 33.3 pg (ref 26.0–34.0)
MCHC: 34.4 g/dL (ref 30.0–36.0)
MCV: 97 fL (ref 80.0–100.0)
Platelets: 203 10*3/uL (ref 150–400)
RBC: 3.99 MIL/uL — ABNORMAL LOW (ref 4.22–5.81)
RDW: 12 % (ref 11.5–15.5)
WBC: 5.9 10*3/uL (ref 4.0–10.5)
nRBC: 0 % (ref 0.0–0.2)

## 2021-01-23 LAB — SALICYLATE LEVEL: Salicylate Lvl: <7 mg/dL — ABNORMAL LOW (ref 7.0–30.0)

## 2021-01-23 LAB — ETHANOL: Alcohol, Ethyl (B): <10 mg/dL (ref <10)

## 2021-01-23 LAB — ACETAMINOPHEN LEVEL: Acetaminophen (Tylenol), Serum: <10 ug/mL — ABNORMAL LOW (ref 10–30)

## 2021-01-23 MED ORDER — THIAMINE HCL 100 MG PO TABS
100.0000 mg | ORAL_TABLET | Freq: Every day | ORAL | Status: DC
  Administered 2021-01-23: 100 mg via ORAL
  Filled 2021-01-23: qty 1

## 2021-01-23 MED ORDER — LORAZEPAM 2 MG PO TABS: 0.0000 mg | ORAL_TABLET | Freq: Two times a day (BID) | ORAL | Status: DC

## 2021-01-23 MED ORDER — LORAZEPAM 2 MG/ML IJ SOLN: 0.0000 mg | Freq: Two times a day (BID) | INTRAMUSCULAR | Status: DC

## 2021-01-23 MED ORDER — LORAZEPAM 2 MG PO TABS: 0.0000 mg | ORAL_TABLET | Freq: Four times a day (QID) | ORAL | Status: DC

## 2021-01-23 MED ORDER — LORAZEPAM 2 MG/ML IJ SOLN: 0.0000 mg | Freq: Four times a day (QID) | INTRAMUSCULAR | Status: DC

## 2021-01-23 MED ORDER — THIAMINE HCL 100 MG/ML IJ SOLN: 100.0000 mg | Freq: Every day | INTRAMUSCULAR | Status: DC

## 2021-01-23 NOTE — ED Notes (Signed)
See triage note, pt states have SI since this AM. Denies plan

## 2021-01-23 NOTE — Consult Note (Signed)
[facility] Face-to-Face Psychiatry Consult   Reason for Consult: Consult for 55 year old man with a history of cocaine abuse who comes to the hospital stating he wants detox Referring Physician:  Vicente Males Patient Identification: Roy Liter Padgett Sr. MRN:  [id number] Principal Diagnosis: Substance induced mood disorder (HCC) Diagnosis:  Principal Problem:   Substance induced mood disorder (HCC) Active Problems:   HIV disease (HCC)   HTN (hypertension)   Cocaine dependence (HCC)   Malingering   Total Time spent with patient: 1 hour  Subjective:   Roy Register Sr. is a 55 y.o. male patient admitted with "I need to get off the drugs".  HPI: Patient seen chart reviewed.  Patient known from prior encounters.  55 year old man with a long history of cocaine abuse and behavior problems.  He tells me that he was in a 30-day program and got out sometime in early January.  His intention of course was to stay clean and he came back to town to stay with his fiance but then had to leave her apartment because she is not allowed to have guests.  The only other place he could think of the stay was with a friend who abuses crack cocaine.  Patient then relapsed and has been out using cocaine since then probably for the last couple weeks at least.  Mood feels down and discouraged and depressed.  Patient is now telling me he is having "suicidal thoughts".  He did not mention this when he first came into the emergency room but added it evidently knowing it was required for some possibility of admission.  Patient when asked about how he would do it says that perhaps he would take a lot of medicine or walk out into traffic.  At the same time he shows evidence of being forward thinking.  He says that he has already spoken to the HIV clinic which is his most stable resource in [place] and they told him that they would try to get [facility] into [facility] and that he should come here and "check in" in the meantime.   Patient tells me he would like to go to the "crisis unit" by which he evidently means our inpatient psychiatric ward.  He is pleasant and calm and lucid.  Has clear plans for the future.  Not having hallucinations not aggressive not threatening.  When I discussed with him the fact that we do not have any beds on the ward he did not continue to threaten suicide but said that if he left here he would just be back using crack cocaine.  Appears to have every intention of continuing to take care of his HIV disease as he always has.  Past Psychiatric History: Long history of cocaine use with mood symptoms attach.  Diagnosis of bipolar disorder with depression but I do not think we have ever seen a manic or mood episode that was not connected to a relapse of substance abuse.  No known actual suicide attempts.  Patient says the longest time he stayed sober was a couple of years.  Risk to Self:   Risk to Others:   Prior Inpatient Therapy:   Prior Outpatient Therapy:    Past Medical History:  Past Medical History:  Diagnosis Date  . AIDS (acquired immune deficiency syndrome) (HCC)   . Arthritis   . Asthma   . Bipolar disorder (HCC)   . Bronchitis   . Complication of anesthesia   . Coronary artery disease   . Depression   .  Dysrhythmia    1st degree heart block/ brady  . GERD (gastroesophageal reflux disease)   . Hepatitis C    treated  . HIV (human immunodeficiency virus infection) (HCC)   . HTN (hypertension)   . PONV (postoperative nausea and vomiting)     Past Surgical History:  Procedure Laterality Date  . APPLICATION OF WOUND VAC Right 09/01/2019   Procedure: APPLICATION OF WOUND VAC;  Surgeon: Kennedy BuckerMenz, Michael, MD;  Location: [facility] ORS;  Service: Orthopedics;  Laterality: Right;  Serial # [id number]  . HERNIA REPAIR Left    inguinal  . TOE SURGERY    . TOE SURGERY Right   . TOTAL HIP ARTHROPLASTY Right 09/01/2019   Procedure: TOTAL HIP ARTHROPLASTY ANTERIOR APPROACH;  Surgeon: Kennedy BuckerMenz,  Michael, MD;  Location: [facility] ORS;  Service: Orthopedics;  Laterality: Right;   Family History:  Family History  Problem Relation Age of Onset  . Cancer Brother   . Uterine cancer Mother   . CAD Mother   . Hypertension Mother   . Hyperlipidemia Mother    Family Psychiatric  History: None reported Social History:  Social History   Substance and Sexual Activity  Alcohol Use Yes  . Alcohol/week: 84.0 standard drinks  . Types: 84 Cans of beer per week   Comment: daily     Social History   Substance and Sexual Activity  Drug Use Yes  . Types: Cocaine, "Crack" cocaine   Comment: 01/23/2021    Social History   Socioeconomic History  . Marital status: Divorced    Spouse name: Not on file  . Number of children: Not on file  . Years of education: Not on file  . Highest education level: Not on file  Occupational History  . Not on file  Tobacco Use  . Smoking status: Current Every Day Smoker    Packs/day: 0.25    Types: Cigarettes  . Smokeless tobacco: Never Used  Vaping Use  . Vaping Use: Never used  Substance and Sexual Activity  . Alcohol use: Yes    Alcohol/week: 84.0 standard drinks    Types: 84 Cans of beer per week    Comment: daily  . Drug use: Yes    Types: Cocaine, "Crack" cocaine    Comment: 01/23/2021  . Sexual activity: Yes  Other Topics Concern  . Not on file  Social History Narrative  . Not on file   Social Determinants of Health   Financial Resource Strain: Not on file  Food Insecurity: Not on file  Transportation Needs: Not on file  Physical Activity: Not on file  Stress: Not on file  Social Connections: Not on file   Additional Social History:    Allergies:   Allergies  Allergen Reactions  . Amlodipine Swelling    Of the tongue  . Lisinopril Swelling  . Lactose Other (See Comments)    GI distress  . Pollen Extract Other (See Comments)    Itchy eyes and runny nose    Labs:  Results for orders placed or performed during the  hospital encounter of 01/23/21 (from the past 48 hour(s))  Comprehensive metabolic panel     Status: Abnormal   Collection Time: 01/23/21  2:11 PM  Result Value Ref Range   Sodium 138 135 - 145 mmol/L   Potassium 4.4 3.5 - 5.1 mmol/L   Chloride 107 98 - 111 mmol/L   CO2 24 22 - 32 mmol/L   Glucose, Bld 112 (H) 70 - 99 mg/dL  Comment: Glucose reference range applies only to samples taken after fasting for at least 8 hours.   BUN 18 6 - 20 mg/dL   Creatinine, Ser 4.58 0.61 - 1.24 mg/dL   Calcium 9.2 8.9 - 09.9 mg/dL   Total Protein 7.3 6.5 - 8.1 g/dL   Albumin 3.9 3.5 - 5.0 g/dL   AST 26 15 - 41 U/L   ALT 28 0 - 44 U/L   Alkaline Phosphatase 74 38 - 126 U/L   Total Bilirubin 0.5 0.3 - 1.2 mg/dL   GFR, Estimated >83 >38 mL/min    Comment: (NOTE) Calculated using the CKD-EPI Creatinine Equation (2021)    Anion gap 7 5 - 15    Comment: Performed at [facility], [address] [place], [place] [address]  Ethanol     Status: None   Collection Time: 01/23/21  2:11 PM  Result Value Ref Range   Alcohol, Ethyl (B) <10 <10 mg/dL    Comment: (NOTE) Lowest detectable limit for serum alcohol is 10 mg/dL.  For medical purposes only. Performed at [facility], [address] [place], [place] [address]   Salicylate level     Status: Abnormal   Collection Time: 01/23/21  2:11 PM  Result Value Ref Range   Salicylate Lvl <7.0 (L) 7.0 - 30.0 mg/dL    Comment: Performed at [facility], [address] [place], [place] [address]  Acetaminophen level     Status: Abnormal   Collection Time: 01/23/21  2:11 PM  Result Value Ref Range   Acetaminophen (Tylenol), Serum <10 (L) 10 - 30 ug/mL    Comment: (NOTE) Therapeutic concentrations vary significantly. A range of 10-30 ug/mL  may be an effective concentration for many patients. However, some  are best treated at concentrations outside of this range. Acetaminophen concentrations >150 ug/mL at 4 hours after  ingestion  and >50 ug/mL at 12 hours after ingestion are often associated with  toxic reactions.  Performed at [facility], [address] [place], [place] [address]   cbc     Status: Abnormal   Collection Time: 01/23/21  2:11 PM  Result Value Ref Range   WBC 5.9 4.0 - 10.5 K/uL   RBC 3.99 (L) 4.22 - 5.81 MIL/uL   Hemoglobin 13.3 13.0 - 17.0 g/dL   HCT 97.3 (L) 53.2 - 99.2 %   MCV 97.0 80.0 - 100.0 fL   MCH 33.3 26.0 - 34.0 pg   MCHC 34.4 30.0 - 36.0 g/dL   RDW 42.6 83.4 - 19.6 %   Platelets 203 150 - 400 K/uL   nRBC 0.0 0.0 - 0.2 %    Comment: Performed at [facility], [address] [place], [place] [address]  Urine Drug Screen, Qualitative     Status: Abnormal   Collection Time: 01/23/21  3:13 PM  Result Value Ref Range   Tricyclic, Ur Screen NONE DETECTED NONE DETECTED   Amphetamines, Ur Screen NONE DETECTED NONE DETECTED   MDMA (Ecstasy)Ur Screen NONE DETECTED NONE DETECTED   Cocaine Metabolite,Ur [place] POSITIVE (A) NONE DETECTED   Opiate, Ur Screen NONE DETECTED NONE DETECTED   Phencyclidine (PCP) Ur S NONE DETECTED NONE DETECTED   Cannabinoid 50 Ng, Ur [place] NONE DETECTED NONE DETECTED   Barbiturates, Ur Screen NONE DETECTED NONE DETECTED   Benzodiazepine, Ur Scrn NONE DETECTED NONE DETECTED   Methadone Scn, Ur NONE DETECTED NONE DETECTED    Comment: (NOTE) Tricyclics + metabolites, urine  Cutoff 1000 ng/mL Amphetamines + metabolites, urine  Cutoff 1000 ng/mL MDMA (Ecstasy), urine              Cutoff 500 ng/mL Cocaine Metabolite, urine          Cutoff 300 ng/mL Opiate + metabolites, urine        Cutoff 300 ng/mL Phencyclidine (PCP), urine         Cutoff 25 ng/mL Cannabinoid, urine                 Cutoff 50 ng/mL Barbiturates + metabolites, urine  Cutoff 200 ng/mL Benzodiazepine, urine              Cutoff 200 ng/mL Methadone, urine                   Cutoff 300 ng/mL  The urine drug screen provides only a preliminary, unconfirmed analytical test  result and should not be used for non-medical purposes. Clinical consideration and professional judgment should be applied to any positive drug screen result due to possible interfering substances. A more specific alternate chemical method must be used in order to obtain a confirmed analytical result. Gas chromatography / mass spectrometry (GC/MS) is the preferred confirm atory method. Performed at [facility], [address] [place], [place] [address]     Current Facility-Administered Medications  Medication Dose Route Frequency Provider Last Rate Last Admin  . LORazepam (ATIVAN) injection 0-4 mg  0-4 mg Intravenous Q6H Merwyn Katos, MD       Or  . LORazepam (ATIVAN) tablet 0-4 mg  0-4 mg Oral Q6H Merwyn Katos, MD      . Melene Muller ON 01/25/2021] LORazepam (ATIVAN) injection 0-4 mg  0-4 mg Intravenous Q12H Merwyn Katos, MD       Or  . Melene Muller ON 01/25/2021] LORazepam (ATIVAN) tablet 0-4 mg  0-4 mg Oral Q12H Merwyn Katos, MD      . thiamine tablet 100 mg  100 mg Oral Daily Merwyn Katos, MD   100 mg at 01/23/21 1616   Or  . thiamine (B-1) injection 100 mg  100 mg Intravenous Daily Merwyn Katos, MD       Current Outpatient Medications  Medication Sig Dispense Refill  . albuterol (VENTOLIN HFA) 108 (90 Base) MCG/ACT inhaler Inhale 1-2 puffs into the lungs every 4 (four) hours as needed for wheezing or shortness of breath. 18 g 1  . citalopram (CELEXA) 40 MG tablet Take 1 tablet (40 mg total) by mouth daily. 30 tablet 1  . hydrOXYzine (ATARAX/VISTARIL) 50 MG tablet Take 1 tablet (50 mg total) by mouth 3 (three) times daily as needed. 30 tablet 0  . mirtazapine (REMERON) 7.5 MG tablet Take 1 tablet (7.5 mg total) by mouth at bedtime. 30 tablet 1  . risperiDONE (RISPERDAL) 1 MG tablet Take 1 tablet (1 mg total) by mouth at bedtime. 30 tablet 1  . traZODone (DESYREL) 150 MG tablet Take 1 tablet (150 mg total) by mouth at bedtime as needed for sleep. 30 tablet 1     Musculoskeletal: Strength & Muscle Tone: within normal limits Gait & Station: normal Patient leans: N/A  Psychiatric Specialty Exam: Physical Exam Vitals and nursing note reviewed.  Constitutional:      Appearance: He is well-developed and well-nourished.  HENT:     Head: Normocephalic and atraumatic.  Eyes:     Conjunctiva/sclera: Conjunctivae normal.     Pupils: Pupils are equal, round, and reactive  to light.  Cardiovascular:     Heart sounds: Normal heart sounds.  Pulmonary:     Effort: Pulmonary effort is normal.  Abdominal:     Palpations: Abdomen is soft.  Musculoskeletal:        General: Normal range of motion.     Cervical back: Normal range of motion.  Skin:    General: Skin is warm and dry.  Neurological:     General: No focal deficit present.     Mental Status: He is alert.  Psychiatric:        Attention and Perception: Attention normal.        Mood and Affect: Affect normal. Mood is depressed.        Speech: Speech normal.        Behavior: Behavior is cooperative.        Thought Content: Thought content includes suicidal ideation. Thought content does not include suicidal plan.        Cognition and Memory: Cognition normal.        Judgment: Judgment is impulsive.     Review of Systems  Constitutional: Negative.   HENT: Negative.   Eyes: Negative.   Respiratory: Negative.   Cardiovascular: Negative.   Gastrointestinal: Negative.   Musculoskeletal: Negative.   Skin: Negative.   Neurological: Negative.   Psychiatric/Behavioral: Positive for dysphoric mood and sleep disturbance. The patient is nervous/anxious.     Blood pressure (!) 144/102, pulse (!) 45, temperature 98.2 F (36.8 C), resp. rate 18, SpO2 100 %.There is no height or weight on file to calculate BMI.  General Appearance: Casual  Eye Contact:  Fair  Speech:  Clear and Coherent  Volume:  Normal  Mood:  Depressed  Affect:  Full Range  Thought Process:  Coherent  Orientation:  Full  (Time, Place, and Person)  Thought Content:  Logical  Suicidal Thoughts:  Yes.  without intent/plan  Homicidal Thoughts:  No  Memory:  Immediate;   Fair Recent;   Fair Remote;   Fair  Judgement:  Fair  Insight:  Fair  Psychomotor Activity:  Normal  Concentration:  Concentration: Fair  Recall:  [employer] of Knowledge:  Fair  Language:  Fair  Akathisia:  No  Handed:  Right  AIMS (if indicated):     Assets:  Desire for Improvement  ADL's:  Intact  Cognition:  WNL  Sleep:        Treatment Plan Summary: Plan 55 year old man with cocaine abuse.  His statements about having suicidal ideation are not very believable.  He has a past history of clear malingering and in this case did not come in reporting suicidal ideation at first.  It seems to be in after thought in his conversation and everything then he says otherwise indicates hopefulness and plans for the future.  I do not believe the patient is at high risk of self injury.  I advised him that we have no beds on the psychiatric ward and I did not think that cocaine detox was a reason that we could refer him to another psychiatric unit either.  Strongly encourage patient to stay on his HIV medicine keep in touch with the ID clinic and follow-up as he sees fit.  Case reviewed with emergency room physician.  From a psychiatric standpoint I do not think we have anything further to offer him in the emergency room.  Patient encouraged to try making some phone calls and see if he can find a safe place to stay.  Disposition: Patient does not meet criteria for psychiatric inpatient admission. Supportive therapy provided about ongoing stressors. Discussed crisis plan, support from social network, calling 911, coming to the Emergency Department, and calling Suicide Hotline.  Mordecai Rasmussen, MD 01/23/2021 4:56 PM

## 2021-01-23 NOTE — BH Assessment (Signed)
TTS attempted to complete assessment but was unsuccessful due to pt being unable to be aroused.

## 2021-01-23 NOTE — ED Triage Notes (Signed)
Pt comes via EMS with c/o SI and needing detox from cocaine. Pt is homeless. Pt states depression. Pt states he takes his medications.  Pt states alcohol use and crack. Pt states last used this am.

## 2021-01-23 NOTE — ED Notes (Signed)
Drink and xtra supper tray provided

## 2021-01-23 NOTE — ED Triage Notes (Signed)
First nurse: Pt in via [facility] as SI, wanting to detox from cocaine. EMS states pt is homeless, comes here voluntarily

## 2021-01-23 NOTE — ED Notes (Signed)
Pt dressed out into hospital attire. [name] belongings to include:  2 black socks  2 brown boots 1 black jacket 1 black bookbag 1 black shirt 1 black hat 1 black pants 2 lighters 1 black phone

## 2021-01-23 NOTE — ED Notes (Signed)
He is lying in the recliner in 24 hallway  - eyes closed  MD has pt ready for discharge

## 2021-01-23 NOTE — ED Notes (Signed)
Pt given clothing to change into for DC.

## 2021-01-23 NOTE — ED Notes (Signed)
Psychiatrist  - [facility]acs is currently at his bedside

## 2021-01-23 NOTE — ED Notes (Signed)
Drink and snacks provided

## 2021-01-23 NOTE — ED Notes (Signed)
Pt received dinner tray and drink  Pt calm and resting, eating dinner at this time.  

## 2021-01-23 NOTE — ED Provider Notes (Signed)
[facility] Emergency Department Provider Note   ____________________________________________   Event Date/Time   First MD Initiated Contact with Patient 01/23/21 1505     (approximate)  I have reviewed the triage vital signs and the nursing notes.   HISTORY  Chief Complaint SI    HPI Roy Amor Sr. is a 55 y.o. male with a stated past medical history of HIV, prediabetes, hypertension, bradycardia, and bipolar disorder who presents for worsening depressive symptoms in the setting of binging crack cocaine and alcohol over the past week.  Patient denies any plan for taking his own life but does state that he has had a suicide attempt approximately 2 years ago when he tried to overdose on prescription medication.  Patient states that he drinks approximately "1 case of beer" per day and the last drink he had was last evening.  Patient denies any complications and detoxing from alcohol before.  Specifically denies any hallucinations or seizure-like activity.  Patient states that he had been abstinent from illicit drugs until this last week when he used crack cocaine and has been using it daily since.  Patient currently denies any homicidal ideation, auditory/visual hallucinations, vision changes, tinnitus, difficulty speaking, facial droop, sore throat, chest pain, shortness of breath, abdominal pain, nausea/vomiting/diarrhea, dysuria, or weakness/numbness/paresthesias in any extremity         Past Medical History:  Diagnosis Date  . AIDS (acquired immune deficiency syndrome) (HCC)   . Arthritis   . Asthma   . Bipolar disorder (HCC)   . Bronchitis   . Complication of anesthesia   . Coronary artery disease   . Depression   . Dysrhythmia    1st degree heart block/ brady  . GERD (gastroesophageal reflux disease)   . Hepatitis C    treated  . HIV (human immunodeficiency virus infection) (HCC)   . HTN (hypertension)   . PONV (postoperative nausea and  vomiting)     Patient Active Problem List   Diagnosis Date Noted  . Cocaine-induced mood disorder (HCC) 11/13/2020  . Allergic rhinitis 06/21/2020  . GERD (gastroesophageal reflux disease) 06/21/2020  . 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  . Arthritis pain, hip 05/18/2020  . 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 Angioedema 07/24/2019  . Chest pain 05/27/2018  . ARF (acute renal failure) (HCC) 04/08/2018  . Malingering 03/07/2017  . Cocaine dependence (HCC) 02/19/2017  . Substance induced mood disorder (HCC) 02/19/2017  . Alcohol use disorder, severe, dependence (HCC) 12/27/2016  . HIV disease (HCC) 10/26/2016  . HTN (hypertension) 10/26/2016  . Dyslipidemia 10/26/2016  . BPH (benign prostatic hyperplasia) 10/26/2016  . Constipation 10/26/2016  . Acquired hallux rigidus of right foot 07/16/2016  . Arthritis 10/25/2014  . Genital warts 08/25/2013    Past Surgical History:  Procedure Laterality Date  . APPLICATION OF WOUND VAC Right 09/01/2019   Procedure: APPLICATION OF WOUND VAC;  Surgeon: Kennedy Bucker, MD;  Location: [facility] ORS;  Service: Orthopedics;  Laterality: Right;  Serial # [id number]  . HERNIA REPAIR Left    inguinal  . TOE SURGERY    . TOE SURGERY Right   . TOTAL HIP ARTHROPLASTY Right 09/01/2019   Procedure: TOTAL HIP ARTHROPLASTY ANTERIOR APPROACH;  Surgeon: Kennedy Bucker, MD;  Location: [facility] ORS;  Service: Orthopedics;  Laterality: Right;    Prior to Admission medications   Medication Sig Start Date End Date Taking? Authorizing Provider  albuterol (VENTOLIN HFA) 108 (90 Base) MCG/ACT inhaler Inhale 1-2 puffs into the  lungs every 4 (four) hours as needed for wheezing or shortness of breath. 05/23/20   Clapacs, Jackquline Denmark, MD  citalopram (CELEXA) 40 MG tablet Take 1 tablet (40 mg total) by mouth daily. 11/02/20   Jesse Sans, MD  hydrOXYzine (ATARAX/VISTARIL) 50 MG tablet Take 1 tablet (50 mg total) by mouth 3 (three) times daily as needed. 11/02/20    Jesse Sans, MD  mirtazapine (REMERON) 7.5 MG tablet Take 1 tablet (7.5 mg total) by mouth at bedtime. 11/02/20   Jesse Sans, MD  risperiDONE (RISPERDAL) 1 MG tablet Take 1 tablet (1 mg total) by mouth at bedtime. 11/02/20   Jesse Sans, MD  traZODone (DESYREL) 150 MG tablet Take 1 tablet (150 mg total) by mouth at bedtime as needed for sleep. 11/02/20   Jesse Sans, MD    Allergies Amlodipine, Lisinopril, Lactose, and Pollen extract  Family History  Problem Relation Age of Onset  . Cancer Brother   . Uterine cancer Mother   . CAD Mother   . Hypertension Mother   . Hyperlipidemia Mother     Social History Social History   Tobacco Use  . Smoking status: Current Every Day Smoker    Packs/day: 0.25    Types: Cigarettes  . Smokeless tobacco: Never Used  Vaping Use  . Vaping Use: Never used  Substance Use Topics  . Alcohol use: Yes    Alcohol/week: 84.0 standard drinks    Types: 84 Cans of beer per week    Comment: daily  . Drug use: Yes    Types: Cocaine, "Crack" cocaine    Comment: 01/23/2021    Review of Systems Constitutional: No fever/chills Eyes: No visual changes. ENT: No sore throat. Cardiovascular: Denies chest pain. Respiratory: Denies shortness of breath. Gastrointestinal: No abdominal pain.  No nausea, no vomiting.  No diarrhea. Genitourinary: Negative for dysuria. Musculoskeletal: Negative for acute arthralgias Skin: Negative for rash. Neurological: Negative for headaches, weakness/numbness/paresthesias in any extremity Psychiatric: Negative for homicidal ideation   ____________________________________________   PHYSICAL EXAM:  VITAL SIGNS: ED Triage Vitals [01/23/21 1410]  Enc Vitals Group     BP (!) 144/102     Pulse Rate (!) 45     Resp 18     Temp 98.2 F (36.8 C)     Temp src      SpO2 100 %     Weight      Height      Head Circumference      Peak Flow      Pain Score 0     Pain Loc      Pain Edu?      Excl.  in GC?    Constitutional: Alert and oriented. Well appearing and in no acute distress. Eyes: Conjunctivae are normal. PERRL. Head: Atraumatic. Nose: No congestion/rhinnorhea. Mouth/Throat: Mucous membranes are moist. Neck: No stridor Cardiovascular: Grossly normal heart sounds.  Good peripheral circulation. Respiratory: Normal respiratory effort.  No retractions. Gastrointestinal: Soft and nontender. No distention. Musculoskeletal: No obvious deformities Neurologic:  Normal speech and language. No gross focal neurologic deficits are appreciated. Skin:  Skin is warm and dry. No rash noted. Psychiatric: Mood and affect are normal. Speech and behavior are normal.  ____________________________________________   LABS (all labs ordered are listed, but only abnormal results are displayed)  Labs Reviewed  COMPREHENSIVE METABOLIC PANEL - Abnormal; Notable for the following components:      Result Value   Glucose, Bld 112 (*)  All other components within normal limits  SALICYLATE LEVEL - Abnormal; Notable for the following components:   Salicylate Lvl <7.0 (*)    All other components within normal limits  ACETAMINOPHEN LEVEL - Abnormal; Notable for the following components:   Acetaminophen (Tylenol), Serum <10 (*)    All other components within normal limits  CBC - Abnormal; Notable for the following components:   RBC 3.99 (*)    HCT 38.7 (*)    All other components within normal limits  ETHANOL  URINE DRUG SCREEN, QUALITATIVE ([facility] ONLY)    PROCEDURES  Procedure(s) performed (including Critical Care):  Procedures   ____________________________________________   INITIAL IMPRESSION / ASSESSMENT AND PLAN / ED COURSE  As part of my medical decision making, I reviewed the following data within the electronic MEDICAL RECORD NUMBER Nursing notes reviewed and incorporated, Labs reviewed, EKG interpreted, Old chart reviewed, Radiograph reviewed and Notes from prior ED visits reviewed  and incorporated        Thoughts are linear and organized, and patient has no AH, VH, or HI. Prior suicide attempt by overdose Prior Psychiatric Hospitalizations: Multiple  Clinically patient displays no overt toxidrome; they are well appearing, with low suspicion for toxic ingestion given history and exam. Thoughts unlikely 2/2 anemia, hypothyroidism, infection, or ICH.  Consult: Psychiatry to evaluate patient for potential hold for danger to self.  Disposition [Seen by both teams] Discharge. Patient evaluated by both emergency and psychiatric professionals and deemed safe at this time for discharge. They have a plan for psychiatric follow up and a safe place to stay with access to physical and emotional support.       ____________________________________________   FINAL CLINICAL IMPRESSION(S) / ED DIAGNOSES  Final diagnoses:  Suicidal ideation  Depression, unspecified depression type  Alcohol abuse  Cocaine abuse [facility])     ED Discharge Orders    None       Note:  This document was prepared using Dragon voice recognition software and may include unintentional dictation errors.   Merwyn Katos, MD 01/23/21 1640

## 2021-02-15 ENCOUNTER — Ambulatory Visit: Admit: 2021-02-15 | Payer: MEDICAID

## 2021-03-09 ENCOUNTER — Ambulatory Visit: Admit: 2021-03-09 | Discharge: 2021-03-10 | Payer: MEDICAID

## 2021-03-09 DIAGNOSIS — B20 Human immunodeficiency virus [HIV] disease: Principal | ICD-10-CM

## 2021-03-09 DIAGNOSIS — M25552 Pain in left hip: Principal | ICD-10-CM

## 2021-03-09 DIAGNOSIS — I10 Essential (primary) hypertension: Principal | ICD-10-CM

## 2021-03-09 MED ORDER — BIKTARVY 50 MG-200 MG-25 MG TABLET
ORAL_TABLET | Freq: Every day | ORAL | 2 refills | 30 days | Status: CP
Start: 2021-03-09 — End: 2021-06-07
  Filled 2021-03-09: qty 30, 30d supply, fill #0

## 2021-03-15 ENCOUNTER — Ambulatory Visit
Admit: 2021-03-15 | Discharge: 2021-03-16 | Payer: MEDICAID | Attending: Student in an Organized Health Care Education/Training Program | Primary: Student in an Organized Health Care Education/Training Program

## 2021-03-15 DIAGNOSIS — I10 Essential (primary) hypertension: Principal | ICD-10-CM

## 2021-03-15 DIAGNOSIS — Z1211 Encounter for screening for malignant neoplasm of colon: Principal | ICD-10-CM

## 2021-03-15 DIAGNOSIS — R0981 Nasal congestion: Principal | ICD-10-CM

## 2021-03-15 DIAGNOSIS — B20 Human immunodeficiency virus [HIV] disease: Principal | ICD-10-CM

## 2021-03-15 DIAGNOSIS — M25552 Pain in left hip: Principal | ICD-10-CM

## 2021-03-15 DIAGNOSIS — G629 Polyneuropathy, unspecified: Principal | ICD-10-CM

## 2021-03-15 DIAGNOSIS — R062 Wheezing: Principal | ICD-10-CM

## 2021-03-15 MED ORDER — HYDROCHLOROTHIAZIDE 25 MG TABLET
ORAL_TABLET | Freq: Every day | ORAL | 11 refills | 30.00000 days | Status: CP
Start: 2021-03-15 — End: 2022-03-10

## 2021-03-15 MED ORDER — BUPROPION HCL XL 150 MG 24 HR TABLET, EXTENDED RELEASE
ORAL_TABLET | Freq: Every morning | ORAL | 3 refills | 90.00000 days | Status: CP
Start: 2021-03-15 — End: 2021-06-13
  Filled 2021-03-15: qty 90, 90d supply, fill #0

## 2021-03-15 MED ORDER — ALBUTEROL SULFATE HFA 90 MCG/ACTUATION AEROSOL INHALER
Freq: Four times a day (QID) | RESPIRATORY_TRACT | 0 refills | 0 days | Status: CP | PRN
Start: 2021-03-15 — End: ?
  Filled 2021-03-15: qty 8.5, 50d supply, fill #0

## 2021-03-15 MED ORDER — GABAPENTIN 300 MG CAPSULE
ORAL_CAPSULE | Freq: Three times a day (TID) | ORAL | 0 refills | 30.00000 days | Status: CP
Start: 2021-03-15 — End: ?
  Filled 2021-03-15: qty 90, 30d supply, fill #0

## 2021-03-15 MED ORDER — ATORVASTATIN 40 MG TABLET
ORAL_TABLET | Freq: Every day | ORAL | 4 refills | 90.00000 days | Status: CP
Start: 2021-03-15 — End: ?
  Filled 2021-05-26: qty 90, 90d supply, fill #0

## 2021-03-23 ENCOUNTER — Ambulatory Visit: Admit: 2021-03-23 | Discharge: 2021-03-24 | Payer: MEDICAID

## 2021-03-23 DIAGNOSIS — R0981 Nasal congestion: Principal | ICD-10-CM

## 2021-03-23 DIAGNOSIS — R062 Wheezing: Principal | ICD-10-CM

## 2021-03-23 MED ORDER — ALBUTEROL SULFATE HFA 90 MCG/ACTUATION AEROSOL INHALER
Freq: Four times a day (QID) | RESPIRATORY_TRACT | 3 refills | 50 days | Status: CP | PRN
Start: 2021-03-23 — End: ?
  Filled 2021-05-26: qty 8.5, 50d supply, fill #0

## 2021-03-28 ENCOUNTER — Ambulatory Visit: Admit: 2021-03-28 | Discharge: 2021-03-28 | Disposition: A | Discharge status: Home / Self Care | Payer: MEDICAID

## 2021-03-28 DIAGNOSIS — Z20822 Suspected COVID-19 virus infection: Principal | ICD-10-CM

## 2021-04-05 ENCOUNTER — Ambulatory Visit: Admit: 2021-04-05 | Discharge: 2021-04-06 | Payer: MEDICAID | Attending: Clinical | Primary: Clinical

## 2021-04-05 MED FILL — BIKTARVY 50 MG-200 MG-25 MG TABLET: ORAL | 30 days supply | Qty: 30 | Fill #1

## 2021-04-07 ENCOUNTER — Ambulatory Visit
Admit: 2021-04-07 | Discharge: 2021-04-08 | Payer: MEDICAID | Attending: Student in an Organized Health Care Education/Training Program | Primary: Student in an Organized Health Care Education/Training Program

## 2021-04-07 DIAGNOSIS — R319 Hematuria, unspecified: Principal | ICD-10-CM

## 2021-04-07 DIAGNOSIS — R399 Unspecified symptoms and signs involving the genitourinary system: Principal | ICD-10-CM

## 2021-04-07 DIAGNOSIS — N401 Enlarged prostate with lower urinary tract symptoms: Principal | ICD-10-CM

## 2021-04-07 DIAGNOSIS — R300 Dysuria: Principal | ICD-10-CM

## 2021-04-07 MED ORDER — MIRABEGRON ER 25 MG TABLET,EXTENDED RELEASE 24 HR
ORAL_TABLET | Freq: Every day | ORAL | 2 refills | 30 days | Status: CP
Start: 2021-04-07 — End: ?

## 2021-04-10 MED ORDER — SOLIFENACIN 5 MG TABLET
ORAL_TABLET | Freq: Every day | ORAL | 2 refills | 30.00000 days | Status: CP
Start: 2021-04-10 — End: 2021-04-10
  Filled 2021-04-12: qty 30, 30d supply, fill #0

## 2021-04-19 ENCOUNTER — Ambulatory Visit
Admit: 2021-04-19 | Discharge: 2021-04-20 | Payer: MEDICAID | Attending: Student in an Organized Health Care Education/Training Program | Primary: Student in an Organized Health Care Education/Training Program

## 2021-04-19 DIAGNOSIS — I10 Essential (primary) hypertension: Principal | ICD-10-CM

## 2021-04-19 DIAGNOSIS — Z5941 Food insecurity: Principal | ICD-10-CM

## 2021-04-19 MED ORDER — AZILSARTAN MEDOXOMIL 40 MG-CHLORTHALIDONE 12.5 MG TABLET
ORAL_TABLET | Freq: Every day | ORAL | 3 refills | 0 days | Status: CN
Start: 2021-04-19 — End: ?

## 2021-04-19 MED ORDER — VALSARTAN 80 MG-HYDROCHLOROTHIAZIDE 12.5 MG TABLET
ORAL_TABLET | Freq: Every day | ORAL | 3 refills | 90.00000 days | Status: CP
Start: 2021-04-19 — End: 2022-04-19
  Filled 2021-04-24: qty 90, 90d supply, fill #0

## 2021-04-19 MED ORDER — LIDOCAINE 4 % TOPICAL PATCH
MEDICATED_PATCH | TRANSDERMAL | 0 refills | 30 days | Status: CP
Start: 2021-04-19 — End: 2021-07-18

## 2021-04-25 DIAGNOSIS — B20 Human immunodeficiency virus [HIV] disease: Principal | ICD-10-CM

## 2021-04-25 MED ORDER — BIKTARVY 50 MG-200 MG-25 MG TABLET
ORAL_TABLET | Freq: Every day | ORAL | 2 refills | 30 days | Status: CP
Start: 2021-04-25 — End: 2021-07-24
  Filled 2021-05-01: qty 30, 30d supply, fill #0

## 2021-04-26 NOTE — Unmapped (Signed)
Referral Services Note     Duration of Intervention: 10 minutes    REASON/TYPE OF CONTACT: Phone    ASSESSMENT: Pt shared that his hip pain is getting increasingly worse and that he thinks he needs to move up his Ortho appointment from July. He shared that he might be able to get an appointment in [place] but wasn't sure about transportation.     INTERVENTION:  Provided Pt with number to [facility] since he said he had used them before and wanted to call to see if they would transport to [place]. Pt called to find out that he would be able to have access to [facility] as long as he scheduled 2 weeks in advance.     Also, reminded Pt that the pharmacist had begun setting him up with the [facility] so that he could obtain his medications in the mail. Provided the Pt with the number [phone] opt 4 [facility] and encouraged him to call to verify his address.     PLAN:  This SW messaged [name] providers (Dr. Rosemarie Beath and Dr. Marca Ancona) requesting an Ortho referral to [facility]. Pt will follow up as needed.    Adalberto Ill, Darcey Nora, LCSW  [facility] Social Worker  Direct: [phone]  Main ID: [phone]

## 2021-04-26 NOTE — Unmapped (Signed)
The patient declined counseling on medication administration, missed dose instructions, goals of therapy, side effects and monitoring parameters, warnings and precautions, drug/food interactions and storage, handling precautions, and disposal because they have taken the medication previously. The information in the declined sections below are for informational purposes only and was not discussed with patient.       [facility]   Patient Onboarding/Medication Counseling    Samuel Baker is a 55 y.o. male with HIV who I am counseling today on continuation of therapy.  I am speaking to the patient.    Was a Nurse, learning disability used for this call? No    Verified patient's date of birth / HIPAA.    Specialty medication(s) to be sent: Infectious Disease: Biktarvy      Non-specialty medications/supplies to be sent: none      Medications not needed at this time: all other medications on chart         Biktarvy (bictegravir, emtricitabine, and tenofovir alafenamide)    Medication & Administration     Dosage: Take 1 tablet by mouth daily    Administration: Take without regard to food    Adherence/Missed dose instructions: take missed dose as soon as you remember. If it is close to the time of your next dose, skip the dose and resume with your next scheduled dose.    Goals of Therapy     To suppress viral replication and keep patient's HIV undetectable by lab tests    Side Effects & Monitoring Parameters     Common Side Effects:  ??? Diarrhea  ??? Upset stomach  ??? Headache  ??? Changes in Weight  ??? Changes in mood    The following side effects should be reported to the provider:     ?? If patient experiences: signs of an allergic reaction (rash; hives; itching; red, swollen, blistered, or peeling skin with or without fever; wheezing; tightness in the chest or throat; trouble breathing, swallowing, or talking; unusual hoarseness; or swelling of the mouth, face, lips, tongue, or throat)  ?? signs of kidney problems (unable to pass urine, change in how much urine is passed, blood in the urine, or a big weight gain)  ?? signs of liver problems (dark urine, feeling tired, not hungry, upset stomach or stomach pain, light-colored stools, throwing up, or yellow skin or eyes)  ?? signs of lactic acidosis (fast breathing, fast heartbeat, a heartbeat that does not feel normal, very bad upset stomach or throwing up, feeling very sleepy, shortness of breath, feeling very tired or weak, very bad dizziness, feeling cold, or muscle pain or cramps)  Weight gain: some patients have reported weight gain after starting this medication. The amount of weight can vary.    Monitoring Parameters:  ?? CD4  Count  ?? HIV RNA plasma levels,  ?? Liver function  ?? Total bilirubin  ?? serum creatinine  ?? urine glucose  ?? urine protein (prior to or when initiating therapy and as clinically indicated during therapy);       Drug/Food Interactions     ??? Medication list reviewed in Epic. The patient was instructed to inform the care team before taking any new medications or supplements. No drug interactions identified.   ??? Calcium Salts: May decrease the serum concentration of Biktarvy. If taken with food, Biktarvy can be administered with calcium salts.   ??? Iron Preparations: May decrease the serum concentration of Biktarvy. If taken with food, Biktarvy can be administered with Ferrous sulfate. If  taken on an empty stomach, Biktarvy must be taken 2 hours before ferrous sulfate. Avoid other iron salts.    Contraindications, Warnings, & Precautions     ??? Black Box Warning: Severe acute exacertbations of HBV have been reported in patients coinfected with HIV-1 and HBV fllowing discontinuation of therapy  ??? Coadministration with dofetilide, rifampin is contraindicated  ??? Immune reconstitution syndrome: Patients may develop immune reconstitution syndrome, resulting in the occurrence of an inflammatory response to an indolent or residual opportunistic infection or activation of autoimmune disorders (eg, [name] disease, polymyositis, Guillain-Barr?? syndrome, autoimmune hepatitis)   ??? Lactic acidosis/hepatomegaly  ??? Renal toxicity: patients with preexisting renal impairment and those taking nephrotoxic agents (including NSAIDs) are at increased risk.     Storage, Handling Precautions, & Disposal     ??? Store in the original container at room temperature.   ??? Keep lid tightly closed.   ??? Store in a dry place. Do not store in a bathroom.   ??? Keep all drugs in a safe place. Keep all drugs out of the reach of children and pets.   ??? Throw away unused or expired drugs. Do not flush down a toilet or pour down a drain unless you are told to do so. Check with your pharmacist if you have questions about the best way to throw out drugs. There may be drug take-back programs in your area.        Current Medications (including OTC/herbals), Comorbidities and Allergies     Current Outpatient Medications   Medication Sig Dispense Refill   ??? acetaminophen (TYLENOL) 500 MG tablet Take 2 tablets (1,000 mg total) by mouth Three (3) times a day. 30 tablet 0   ??? albuterol HFA 90 mcg/actuation inhaler Inhale 1 puff by mouth every six (6) hours as needed for wheezing. 8.5 g 3   ??? atorvastatin (LIPITOR) 40 MG tablet Take 1 tablet (40 mg total) by mouth daily. 90 tablet 4   ??? bictegrav-emtricit-tenofov ala (BIKTARVY) 50-200-25 mg tablet Take 1 tablet by mouth daily. 30 tablet 2   ??? buPROPion (WELLBUTRIN XL) 150 MG 24 hr tablet Take 1 tablet (150 mg total) by mouth every morning. 90 tablet 3   ??? gabapentin (NEURONTIN) 300 MG capsule Take 1 capsule (300 mg total) by mouth Three (3) times a day. 90 capsule 0   ??? lidocaine 4 % patch Place 1 patch on the skin once daily. Remove patch after 12 hours. 30 patch 0   ??? solifenacin (VESICARE) 5 MG tablet Take 1 tablet (5 mg total) by mouth daily. 30 tablet 2   ??? valsartan-hydrochlorothiazide (DIOVAN-HCT) 80-12.5 mg per tablet Take 1 tablet by mouth daily. 90 tablet 3     No current facility-administered medications for this visit.       Allergies   Allergen Reactions   ??? Amlodipine Swelling     Of the tongue  Of the tongue     ??? Lactose Other (See Comments)     GI distress  GI distress  GI distress  GI distress  GI distress    GI distress   ??? Lisinopril Swelling   ??? Bee Pollen Itching and Other (See Comments)     Itchy eyes and runny nose   ??? Pollen Extracts Itching       Patient Active Problem List   Diagnosis   ??? Ventral hernia   ??? Genital warts   ??? HTN (hypertension) (RAF-HCC)   ??? HIV disease (CMS-HCC)   ???  Constipation   ??? Arthritis   ??? Tobacco abuse   ??? Acquired hallux rigidus of right foot   ??? PND (paroxysmal nocturnal dyspnea)   ??? Arthritis pain, hip   ??? Alcohol use disorder, severe, dependence (CMS-HCC)   ??? 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 Wheezing   ??? Hyperlipidemia   ??? Allergic rhinitis   ??? GERD (gastroesophageal reflux disease)   ??? ARF (acute renal failure) (CMS-HCC)   ??? Angioedema   ??? BPH (benign prostatic hyperplasia)   ??? Dyslipidemia   ??? MDD (major depressive disorder), recurrent episode, moderate (CMS-HCC)   ??? Substance induced mood disorder (CMS-HCC)   ??? Primary osteoarthritis of right hip   ??? Status post total hip replacement, right   ??? Cocaine-induced mood disorder (CMS-HCC)       Reviewed and up to date in Epic.    Appropriateness of Therapy     Acute infections noted within Epic:  No active infections  Patient reported infection: None    Is medication and dose appropriate based on diagnosis and infection status? Yes    Prescription has been clinically reviewed: Yes      Baseline Quality of Life Assessment      How many days over the past month did your HIV  keep you from your normal activities? For example, brushing your teeth or getting up in the morning. 0    Financial Information     Medication Assistance provided: None Required    Anticipated copay of $3 reviewed with patient. Verified delivery address.    Delivery Information     Scheduled delivery date: 05/02/21    Expected start date: 05/02/21    Medication will be delivered via Next Day Courier to the prescription address in [facility].  This shipment will not require a signature.      Explained the services we provide at [facility] and that each month we would call to set up refills.  Stressed importance of returning phone calls so that we could ensure they receive their medications in time each month.  Informed patient that we should be setting up refills 7-10 days prior to when they will run out of medication.  A pharmacist will reach out to perform a clinical assessment periodically.  Informed patient that a welcome packet, containing information about our pharmacy and other support services, a Notice of Privacy Practices, and a drug information handout will be sent.      The patient or caregiver noted above participated in the development of this care plan and knows that they can request review of or adjustments to the care plan at any time.      Patient or caregiver verbalized understanding of the above information as well as how to contact the pharmacy at [phone] option 4 with any questions/concerns.  The pharmacy is open Monday through Friday 8:30am-4:30pm.  A pharmacist is available 24/7 via pager to answer any clinical questions they may have.    Patient Specific Needs     - Does the patient have any physical, cognitive, or cultural barriers? No    - Patient prefers to have medications discussed with  Patient     - Is the patient or caregiver able to read and understand education materials at a high school level or above? Yes    - [name] primary language is  English     - Is the patient high risk? No    -  Does the patient require a Care Management Plan? No     - Does the patient require physician intervention or other additional services (i.e. nutrition, smoking cessation, social work)? No      Arnold Long  [facility] Specialty Pharmacist

## 2021-04-28 ENCOUNTER — Ambulatory Visit
Admit: 2021-04-28 | Discharge: 2021-04-29 | Payer: MEDICAID | Attending: Student in an Organized Health Care Education/Training Program | Primary: Student in an Organized Health Care Education/Training Program

## 2021-04-28 DIAGNOSIS — G629 Polyneuropathy, unspecified: Principal | ICD-10-CM

## 2021-04-28 DIAGNOSIS — R319 Hematuria, unspecified: Principal | ICD-10-CM

## 2021-04-28 DIAGNOSIS — M1612 Unilateral primary osteoarthritis, left hip: Principal | ICD-10-CM

## 2021-04-28 LAB — CREATININE
CREATININE: 1.2 mg/dL — ABNORMAL HIGH
EGFR CKD-EPI AA MALE: 78 mL/min/{1.73_m2} (ref >=60)
EGFR CKD-EPI NON-AA MALE: 67 mL/min/{1.73_m2} (ref >=60)

## 2021-04-28 LAB — POTASSIUM: POTASSIUM: 5.1 mmol/L (ref 3.5–5.1)

## 2021-04-28 LAB — SODIUM: SODIUM: 138 mmol/L (ref 135–145)

## 2021-04-28 MED ORDER — CELECOXIB 100 MG CAPSULE
ORAL_CAPSULE | Freq: Two times a day (BID) | ORAL | 0 refills | 30 days | Status: CP
Start: 2021-04-28 — End: 2021-05-28
  Filled 2021-04-28: qty 60, 30d supply, fill #0

## 2021-04-28 MED ORDER — GABAPENTIN 300 MG CAPSULE
ORAL_CAPSULE | Freq: Three times a day (TID) | ORAL | 0 refills | 30.00000 days | Status: CP
Start: 2021-04-28 — End: 2021-05-28
  Filled 2021-04-28: qty 180, 30d supply, fill #0

## 2021-04-28 MED ORDER — PANTOPRAZOLE 40 MG TABLET,DELAYED RELEASE
ORAL_TABLET | Freq: Every day | ORAL | 0 refills | 30 days | Status: CP
Start: 2021-04-28 — End: 2021-05-28
  Filled 2021-04-28: qty 30, 30d supply, fill #0

## 2021-04-28 NOTE — Unmapped (Signed)
[employer]ed forces training and education officer. Up from last month but otherwise within recent baseline. Increased gabapentin dose is still appropriate. Renal function may limit further NSAID use, so would repeat at his next clinic visit in 2 weeks.

## 2021-04-28 NOTE — Unmapped (Signed)
[facility]  [address]  [place], [address]  Phone: [phone]  Toll Free: [phone]  Fax: [phone]      Today's Visit:  ??? Please START the following medications: Celebrex 100mg  BID.  ??? Please CHANGE how you take the following medications: Gabapentin to 600mg  three times daily.  ??? Referrals placed today: [facility]. Call them at [phone].  ??? Please follow up as needed.    Thank you for choosing [facility] for your care.    Please bring all your medications to each clinic visit.

## 2021-04-28 NOTE — Unmapped (Signed)
Internal Medicine Clinic Visit    Reason for visit: No chief complaint on file.      A/P:  Samuel Baker is a 55 y.o. male with history as below here for a follow up of left hip pain.    1. Primary osteoarthritis of left hip  Advanced disease with significant pain. Anticipate he will need THA in the near future and has appointment with ortho in July. He has normal renal function so will start COX-2 inhibitor and will increase gabapentin as below for neuropatic pain.  - Sodium  - Potassium Level  - Creatinine  - celecoxib (CELEBREX) 100 MG capsule; Take 1 capsule (100 mg total) by mouth Two (2) times a day.  Dispense: 60 capsule; Refill: 0  - Protonix daily while on Celebrex for gastric protection    2. Neuropathy  Increasing gabapentin given normal renal function.  - gabapentin (NEURONTIN) 300 MG capsule; Take 2 capsules (600 mg total) by mouth Three (3) times a day.  Dispense: 180 capsule; Refill: 0    HM:  -Not addressed due to acute visit    Medication adherence and barriers to the treatment plan have been addressed. Opportunities to optimize healthy behaviors have been discussed. Patient / caregiver voiced understanding.      No follow-ups on file.    Staffed with Dr. Craige Cotta, MD  Internal Medicine, PGY-3  __________________________________________________________    HPI:  Samuel Baker is here for No chief complaint on file.    He has had chronic pain of his left hip. Worse with movement but hurts at rest as well. Described as lightning sharp pain, like getting stabbed. Much worse with uneven ground. Worse after being on feet long time. Pain is all lateral, no groin or medial pain. Some popping and clicking sensation. He is taking tylenol OTC for pain without relief. No topicals.     A 10 point review of systems was negative except for pertinent items noted in the HPI.  __________________________________________________________    Problem List:  Patient Active Problem List Diagnosis   ??? Ventral hernia   ??? Genital warts   ??? HTN (hypertension) (RAF-HCC)   ??? HIV disease (CMS-HCC)   ??? Constipation   ??? Arthritis   ??? Tobacco abuse   ??? Acquired hallux rigidus of right foot   ??? PND (paroxysmal nocturnal dyspnea)   ??? Arthritis pain, hip   ??? Alcohol use disorder, severe, dependence (CMS-HCC)   ??? 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 Wheezing   ??? Hyperlipidemia   ??? Allergic rhinitis   ??? GERD (gastroesophageal reflux disease)   ??? ARF (acute renal failure) (CMS-HCC)   ??? Angioedema   ??? BPH (benign prostatic hyperplasia)   ??? Dyslipidemia   ??? MDD (major depressive disorder), recurrent episode, moderate (CMS-HCC)   ??? Substance induced mood disorder (CMS-HCC)   ??? Primary osteoarthritis of right hip   ??? Status post total hip replacement, right   ??? Cocaine-induced mood disorder (CMS-HCC)       Medications:  Reviewed in EPIC  __________________________________________________________    Physical Exam:   Vital Signs:  There were no vitals filed for this visit.    Gen: Well appearing, NAD  CV: Bradycardic and regular, no murmurs  Pulm: CTA bilaterally, normal work of breathing  Abd: Soft, NTND, normal BS.  Ext: No edema, distal pulses intact  MSK: Limited AROM and PROM of left hip  in both flexion and extension. Significant pain with abduction and adduction of hip joint. No lateral tenderness to palpation of trochanteric bursa.    PHQ-9 Score:     GAD-7 Score:

## 2021-04-28 NOTE — Unmapped (Signed)
Immediately after or during the visit, I reviewed with the resident the medical history and the resident’s findings on physical examination.  I discussed with the resident the patient’s diagnosis and concur with the treatment plan as documented in the resident note. Damiya Sandefur R Semaje Kinker, MD

## 2021-05-01 DIAGNOSIS — M1612 Unilateral primary osteoarthritis, left hip: Principal | ICD-10-CM

## 2021-05-01 NOTE — Unmapped (Signed)
Medical Case Management Social Work Note     Duration of Intervention: 15 minutes    REASON/TYPE OF CONTACT: Phone    ASSESSMENT: Jibril called this SW to ask for DeVaughn's number at [employer] to discuss his food delivery.      ADHERENCE: Did not discuss at this interaction due to competing priorities and/or no concerns.    INTERVENTION: This SW provided DeVaughns number. This SW received a message from front desk staff, Ruthe Mannan, who shared that [facility] is interested in transferring his care to [facility] and wants his information sent there. This SW called Rodric to clarify since he did not share this plan during their earlier phone conversation. Kimm shared that he just left [facility] and was interested in their research studies, but cannot participate unless he is a [facility] patient. This SW called the [facility] intake specialist to discuss which documents this SW needs to submit for a referral. The intake specialist shared that [facility] can access Clerence's records via CareEverywhere.    PLAN: No further SW needs identified at this time.       Bali Lyn, LCSW, CCM

## 2021-05-02 NOTE — Unmapped (Signed)
Medical Case Management Social Work Note     Duration of Intervention: 5 minutes    REASON/TYPE OF CONTACT: Phone    ASSESSMENT: Samuel Baker called this SW to ask for Samuel Baker phone number because he was not home when the [employer] called him to schedule food delivery.     ADHERENCE: Did not discuss at this interaction due to competing priorities and/or no concerns.    INTERVENTION: This SW provided Samuel Baker's phone number. Samuel Baker plans to keep the number in his wallet so he does not forget it.     PLAN: Samuel Baker will reach out to this SW if any additional needs arise.       Shanley Furlough, LCSW, CCM

## 2021-05-03 NOTE — Unmapped (Signed)
Medical Case Management Social Work Note     Duration of Intervention: 5 minutes    REASON/TYPE OF CONTACT: Phone    ASSESSMENT: Pt called asking for [facility] DSS phone number. He went the other day for rent assistance and wanted to check on this. He plans to be in the [place] house for a year until him and his fiance can afford to move into a house as his dream is to have a yard where he can [place] and do yardwork.    Pt states he still has his intake tomorrow at [facility]. SW reminded pt that after his intake we have to discharge him from [facility] social work services and we won't be able to work with him. Informed him the [facility] may not have as much medical case management. Stated that pt can come back into care with [place] if he would like to in the future. He stated understanding.     ADHERENCE: Did not discuss at this interaction due to competing priorities and/or no concerns.    INTERVENTION:  SW gave phone number for [facility] DSS [phone]. Talked to pt about transferring care and provided support.     PLAN:  Pt will call DSS.          Bradly Bienenstock LCSW, CHES  [facility] Clinic Lead Social Worker  Direct: [phone]  Main ID: [phone]

## 2021-05-05 NOTE — Unmapped (Signed)
Medical Case Management Discharge Note    Duration of Intervention: 5 minutes    Talked with Patient? NO    TYPE OF CONTACT: Phone call attempt    REASON FOR DISCHARGE:  Transferred Care to [facility]    EFFECTIVE DATE:  05/05/21    OUTSIDE REFERRALS MADE: No       Jemimah Cressy, LCSW, CCM

## 2021-05-10 ENCOUNTER — Ambulatory Visit: Admit: 2021-05-10 | Payer: MEDICAID

## 2021-05-23 DIAGNOSIS — M1612 Unilateral primary osteoarthritis, left hip: Principal | ICD-10-CM

## 2021-05-23 DIAGNOSIS — G629 Polyneuropathy, unspecified: Principal | ICD-10-CM

## 2021-05-23 MED ORDER — GABAPENTIN 300 MG CAPSULE
ORAL_CAPSULE | Freq: Three times a day (TID) | ORAL | 0 refills | 30 days
Start: 2021-05-23 — End: 2021-06-22

## 2021-05-23 MED ORDER — CELECOXIB 100 MG CAPSULE
ORAL_CAPSULE | Freq: Two times a day (BID) | ORAL | 0 refills | 30.00000 days
Start: 2021-05-23 — End: 2021-06-22

## 2021-05-23 NOTE — Unmapped (Signed)
[facility] Clinical Assessment & Refill Coordination Note    Samuel Baker, DOB: Mar 12, 1966  Phone: [phone] (home)     All above HIPAA information was verified with patient.     Was a Nurse, learning disability used for this call? No    Specialty Medication(s):   Infectious Disease: Biktarvy     Current Outpatient Medications   Medication Sig Dispense Refill   ??? acetaminophen (TYLENOL) 500 MG tablet Take 2 tablets (1,000 mg total) by mouth Three (3) times a day. 30 tablet 0   ??? albuterol HFA 90 mcg/actuation inhaler Inhale 1 puff by mouth every six (6) hours as needed for wheezing. 8.5 g 3   ??? atorvastatin (LIPITOR) 40 MG tablet Take 1 tablet (40 mg total) by mouth daily. 90 tablet 4   ??? bictegrav-emtricit-tenofov ala (BIKTARVY) 50-200-25 mg tablet Take 1 tablet by mouth daily. 30 tablet 2   ??? buPROPion (WELLBUTRIN XL) 150 MG 24 hr tablet Take 1 tablet (150 mg total) by mouth every morning. 90 tablet 3   ??? celecoxib (CELEBREX) 100 MG capsule Take 1 capsule (100 mg total) by mouth Two (2) times a day. 60 capsule 0   ??? gabapentin (NEURONTIN) 300 MG capsule Take 2 capsules (600 mg total) by mouth Three (3) times a day. 180 capsule 0   ??? lidocaine 4 % patch Place 1 patch on the skin once daily. Remove patch after 12 hours. 30 patch 0   ??? pantoprazole (PROTONIX) 40 MG tablet Take 1 tablet (40 mg total) by mouth daily. 30 tablet 0   ??? solifenacin (VESICARE) 5 MG tablet Take 1 tablet (5 mg total) by mouth daily. (Patient not taking: Reported on 05/23/2021) 30 tablet 2   ??? valsartan-hydrochlorothiazide (DIOVAN-HCT) 80-12.5 mg per tablet Take 1 tablet by mouth daily. 90 tablet 3     No current facility-administered medications for this visit.        Changes to medications: Samuel Baker Reports stopping the following medications: solifenacin    Allergies   Allergen Reactions   ??? Amlodipine Swelling     Of the tongue  Of the tongue     ??? Lactose Other (See Comments)     GI distress  GI distress  GI distress  GI distress  GI distress    GI distress   ??? Lisinopril Swelling   ??? Bee Pollen Itching and Other (See Comments)     Itchy eyes and runny nose   ??? Pollen Extracts Itching       Changes to allergies: No    SPECIALTY MEDICATION ADHERENCE     Biktarvy   : approximately 10 days of medicine on hand       Medication Adherence    Patient reported X missed doses in the last month: 0  Specialty Medication: Biktarvy  Patient is on additional specialty medications: No  Demonstrates understanding of importance of adherence: yes  Informant: patient  Provider-estimated medication adherence level: good  Patient is at risk for Non-Adherence: No          Specialty medication(s) dose(s) confirmed: Regimen is correct and unchanged.     Are there any concerns with adherence? No    Adherence counseling provided? Not needed    CLINICAL MANAGEMENT AND INTERVENTION      Clinical Benefit Assessment:    Do you feel the medicine is effective or helping your condition? Yes    HIV ASSOCIATED LABS:     Lab Results  Component Value Date/Time    HIVRS Not Detected 12/28/2020 11:41 AM    HIVRS Not Detected 09/21/2020 09:28 AM    HIVRS Not Detected 06/15/2020 10:13 AM    HIVRS Not Detected 01/24/2015 10:06 AM    HIVRS Detected 10/25/2014 11:22 AM    HIVRS Not Detected 09/03/2014 12:29 PM    HIVCP 79 (H) 04/30/2018 10:54 AM    HIVCP 51 (H) 03/28/2017 02:39 PM    HIVCP <40 (H) 02/28/2017 08:10 PM    HIVCP <40 10/25/2014 11:22 AM    HIVCP <40 07/20/2014 10:20 AM    HIVCP 508 08/25/2013 12:20 PM    ACD4 540 06/15/2020 10:13 AM    ACD4 451 (L) 12/31/2018 02:10 PM    ACD4 520 03/28/2017 02:39 PM    ACD4 644 01/24/2015 10:06 AM    ACD4 469 (L) 10/25/2014 11:22 AM    ACD4 274 (L) 07/20/2014 10:20 AM       Clinical Benefit counseling provided? Labs from 12/28/20 show evidence of clinical benefit    Adverse Effects Assessment:    Are you experiencing any side effects? No    Are you experiencing difficulty administering your medicine? No    Quality of Life Assessment:    How many days over the past month did your HIV  keep you from your normal activities? For example, brushing your teeth or getting up in the morning. 0    Have you discussed this with your provider? Not needed    Acute Infection Status:    Acute infections noted within Epic:  No active infections  Patient reported infection: None    Therapy Appropriateness:    Is therapy appropriate? Yes, therapy is appropriate and should be continued    DISEASE/MEDICATION-SPECIFIC INFORMATION      N/A    PATIENT SPECIFIC NEEDS     - Does the patient have any physical, cognitive, or cultural barriers? No    - Is the patient high risk? No    - Does the patient require a Care Management Plan? No     - Does the patient require physician intervention or other additional services (i.e. nutrition, smoking cessation, social work)? No      SHIPPING     Specialty Medication(s) to be Shipped:   Infectious Disease: Biktarvy    Other medication(s) to be shipped:   ?? atorvastatin 40mg   ?? bupropion XL 150mg   ?? Proair HFA, gabapentin (new Rx needed: request sent)  ?? pantoprazole (new Rx needed: request sent)  ?? celecoxib (new Rx needed: request sent)     Changes to insurance: No    Delivery Scheduled: Yes, Expected medication delivery date: 05/26/21.     Medication will be delivered via Same Day Courier to the confirmed prescription address in [facility].    The patient will receive a drug information handout for each medication shipped and additional FDA Medication Guides as required.  Verified that patient has previously received a Conservation officer, historic buildings and a Surveyor, mining.    The patient or caregiver noted above participated in the development of this care plan and knows that they can request review of or adjustments to the care plan at any time.      All of the patient's questions and concerns have been addressed.    [name]   [facility] Specialty Pharmacist

## 2021-05-25 MED ORDER — GABAPENTIN 300 MG CAPSULE
ORAL_CAPSULE | Freq: Three times a day (TID) | ORAL | 0 refills | 30 days | Status: CP
Start: 2021-05-25 — End: 2021-06-24
  Filled 2021-05-26: qty 180, 30d supply, fill #0

## 2021-05-25 MED ORDER — PANTOPRAZOLE 40 MG TABLET,DELAYED RELEASE
ORAL_TABLET | Freq: Every day | ORAL | 0 refills | 30 days
Start: 2021-05-25 — End: 2021-06-24

## 2021-05-25 MED ORDER — CELECOXIB 100 MG CAPSULE
ORAL_CAPSULE | Freq: Two times a day (BID) | ORAL | 0 refills | 30.00000 days
Start: 2021-05-25 — End: 2021-06-24

## 2021-05-26 NOTE — Unmapped (Signed)
Samuel Baker 's Biktarvy shipment will be canceled  as a result of the medication is too soon to refill by 74 days.     I have reached out to the patient  at [phone] and left a voicemail message.  We will not reschedule the medication and have removed this/these medication(s) from the work request.  We have canceled this work request.

## 2021-06-01 ENCOUNTER — Emergency Department
Admit: 2021-06-01 | Discharge: 2021-06-01 | Disposition: A | Discharge status: Home / Self Care | Payer: MEDICAID | Attending: Emergency Medicine

## 2021-06-01 ENCOUNTER — Ambulatory Visit
Admit: 2021-06-01 | Discharge: 2021-06-01 | Disposition: A | Discharge status: Home / Self Care | Payer: MEDICAID | Attending: Emergency Medicine

## 2021-06-01 DIAGNOSIS — R1032 Left lower quadrant pain: Principal | ICD-10-CM

## 2021-06-01 DIAGNOSIS — K5900 Constipation, unspecified: Principal | ICD-10-CM

## 2021-06-01 LAB — CBC W/ AUTO DIFF
BASOPHILS ABSOLUTE COUNT: 0 10*9/L (ref 0.0–0.1)
BASOPHILS RELATIVE PERCENT: 1.3 %
EOSINOPHILS ABSOLUTE COUNT: 0.1 10*9/L (ref 0.0–0.5)
EOSINOPHILS RELATIVE PERCENT: 2.5 %
HEMATOCRIT: 41.7 % (ref 39.0–48.0)
HEMOGLOBIN: 14.3 g/dL (ref 12.9–16.5)
LYMPHOCYTES ABSOLUTE COUNT: 1.7 10*9/L (ref 1.1–3.6)
LYMPHOCYTES RELATIVE PERCENT: 47.9 %
MEAN CORPUSCULAR HEMOGLOBIN CONC: 34.4 g/dL (ref 32.0–36.0)
MEAN CORPUSCULAR HEMOGLOBIN: 32.7 pg — ABNORMAL HIGH (ref 25.9–32.4)
MEAN CORPUSCULAR VOLUME: 95.3 fL (ref 77.6–95.7)
MEAN PLATELET VOLUME: 8.2 fL (ref 6.8–10.7)
MONOCYTES ABSOLUTE COUNT: 0.3 10*9/L (ref 0.3–0.8)
MONOCYTES RELATIVE PERCENT: 8.7 %
NEUTROPHILS ABSOLUTE COUNT: 1.4 10*9/L — ABNORMAL LOW (ref 1.8–7.8)
NEUTROPHILS RELATIVE PERCENT: 39.6 %
PLATELET COUNT: 208 10*9/L (ref 150–450)
RED BLOOD CELL COUNT: 4.38 10*12/L (ref 4.26–5.60)
RED CELL DISTRIBUTION WIDTH: 13.4 % (ref 12.2–15.2)
WBC ADJUSTED: 3.5 10*9/L — ABNORMAL LOW (ref 3.6–11.2)

## 2021-06-01 LAB — URINALYSIS WITH CULTURE REFLEX
BACTERIA: NONE SEEN /HPF
BILIRUBIN UA: NEGATIVE
BLOOD UA: NEGATIVE
GLUCOSE UA: NEGATIVE
KETONES UA: NEGATIVE
LEUKOCYTE ESTERASE UA: NEGATIVE
NITRITE UA: NEGATIVE
PH UA: 5.5 (ref 5.0–9.0)
PROTEIN UA: NEGATIVE
RBC UA: <1 /HPF (ref <=3)
SPECIFIC GRAVITY UA: 1.028 (ref 1.003–1.030)
SQUAMOUS EPITHELIAL: <1 /HPF (ref 0–5)
UROBILINOGEN UA: <2
WBC UA: <1 /HPF (ref <=2)

## 2021-06-01 LAB — HIGH SENSITIVITY TROPONIN I - SERIAL: HIGH SENSITIVITY TROPONIN I: 5 ng/L (ref <=53)

## 2021-06-01 LAB — LIPASE: LIPASE: 34 U/L (ref 12–53)

## 2021-06-01 LAB — COMPREHENSIVE METABOLIC PANEL
ALBUMIN: 3.9 g/dL (ref 3.4–5.0)
ALKALINE PHOSPHATASE: 79 U/L (ref 46–116)
ALT (SGPT): 23 U/L (ref 10–49)
ANION GAP: 4 mmol/L — ABNORMAL LOW (ref 5–14)
AST (SGOT): 22 U/L (ref <=34)
BILIRUBIN TOTAL: 0.4 mg/dL (ref 0.3–1.2)
BLOOD UREA NITROGEN: 19 mg/dL (ref 9–23)
BUN / CREAT RATIO: 15
CALCIUM: 9.3 mg/dL (ref 8.7–10.4)
CHLORIDE: 106 mmol/L (ref 98–107)
CO2: 28 mmol/L (ref 20.0–31.0)
CREATININE: 1.23 mg/dL — ABNORMAL HIGH
EGFR CKD-EPI (2021) MALE: 69 mL/min/{1.73_m2} (ref >=60)
GLUCOSE RANDOM: 101 mg/dL (ref 70–179)
POTASSIUM: 4.2 mmol/L (ref 3.4–4.8)
PROTEIN TOTAL: 7.1 g/dL (ref 5.7–8.2)
SODIUM: 138 mmol/L (ref 135–145)

## 2021-06-01 MED ORDER — MAGNESIUM CITRATE 100 MG CAPSULE
ORAL_CAPSULE | Freq: Every day | ORAL | 0 refills | 0 days | Status: CP
Start: 2021-06-01 — End: ?

## 2021-06-01 MED ORDER — DOCUSATE SODIUM 100 MG CAPSULE
ORAL_CAPSULE | Freq: Two times a day (BID) | ORAL | 0 refills | 15 days | Status: CP | PRN
Start: 2021-06-01 — End: 2021-07-01
  Filled 2021-06-01: qty 30, 15d supply, fill #0

## 2021-06-01 MED ADMIN — iohexoL (OMNIPAQUE) 350 mg iodine/mL solution 75 mL: 75 mL | INTRAVENOUS | @ 16:00:00 | Stop: 2021-06-01

## 2021-06-01 MED ADMIN — lactated ringers bolus 1,000 mL: 1000 mL | INTRAVENOUS | @ 15:00:00 | Stop: 2021-06-01

## 2021-06-01 NOTE — Unmapped (Signed)
[facility]  Emergency Department Provider Note        ED Clinical Impression     Final diagnoses:   Left lower quadrant abdominal pain (Primary)   Constipation, unspecified constipation type        Impression, ED Course, Assessment and Plan     Impression: Samuel Baker is a 55 y.o. male with a history of HIV with an undetectable viral load on Biktarvy (CD4 count of 539 from 5/19), gastritis, arthritis, bipolar disorder, depression, past polysubstance abuse presenting with progressive abdominal distension and constipation for three weeks. Vital signs are stable. Physical exam demonstrated a soft, moderately distended abdomen with tenderness to palpation diffusely, most pronounced near the umbilicus with palpable hernia. No rebound tenderness or guarding. No CVA tenderness. Cardiopulmonary exam unremarkable aside from bradycardia, which he reports is his baseline.    Given Mr. Hyder's duration and progressive nature of symptoms without vomiting, fevers, or localizing pain, he is likely experiencing constipation. Other diagnoses to rule out include volvulus or SBO, though unlikely since he has had no vomiting or history of abdominal surgeries. Strangulation from abdominal hernia seems quite unlikely since he has no fever, overlying skin changes. Mesenteric ischemia is also on the differential, but he does not have classic post-prandial symptoms, food fear, or pain out of proportion to physical exam. I do not believe this is of immunocompromised etiology with his undetectable viral load and CD4 count of 539. Will order CBC, CMP, lipase, and CT abd/pelvis with contrast. Will also order trop and EKG to rule out atypical ACS and confirm sinus bradycardia vs new heart block.     10:30 AM: WBC 3.5, has history of WBC ranging from 3.8-5.4 likely from HIV. CMP with Cr of 1.23, unchanged from one month ago. Otherwise CMP unremarkable. Lipase, hsTrop unremarkable. HEART score 3, reassured from a cardiac standpoint. UA and abd CT pending.    1:10 PM: CT abd/pelvis with contrast showed no dilated small bowel with transition point as well as moderate retained stool seen in the colon, which Thursa Emme represent constipation. There are nonobstructive renal calculi. CT also showed a small amount of free fluid in dependent pelvis, which is unusual in a male patient. Spoke with radiology to discuss potential causes including inflammatory conditions and early colitis, which is not represented in his clinical picture. Descending colonic thickening is presumed to be from distention.    2:00 PM: UA is entirely unremarkable.    Final diagnosis appears to be distention and lower abdominal pressure from constipation. CT reassuring in that there is no SBO, volvulus, strangulated hernia, or diverticulitis. Will order bowel regimen of Colace and Magnesium citrate and give strict return precautions including worsening abdominal pain, hematochezia, nausea and vomiting, or becoming febrile. Recommended he obtain a 24-48 hours abdominal re-check. Spoke with patient and he verbalizes understanding of our recommendations and comfort with our stated plan.       Additional Medical Decision Making     I have reviewed the vital signs and the nursing notes. Labs and radiology results that were available during my care of the patient were independently reviewed by me and considered in my medical decision making.     I directly visualized and independently interpreted the EKG tracing.   I independently visualized the radiology images.   I reviewed the patient's prior medical records I discussed the case with the radiologist.           History     Chief Complaint  Abdominal Pain      History of Present Illness   Samuel Baker is a 55 y.o. male with a history of HIV with an undetectable viral load on Biktarvy (CD4 count of 539 from 5/19), gastritis, arthritis, bipolar disorder, depression, polysubstance abuse presenting with progressive abdominal distension and constipation. This began 3 weeks ago and has progressively worsened. He states that he has a bowel movement every 3 or 4 days consisting of minimal watery diarrhea. Denies red or tarry black appearance. He has taken Ex-Lax with minimal relief. He has had increasing lower abdominal pressure during this time. He has a history of an abdominal hernia. He takes tramadol but has had no recent medication changes, no history of constipation, no abdominal surgeries, no history of ACS. No n/v, no fevers at home. No acute worsening of sx with food intake, appetite is unchanged. He has an appointment for a colonoscopy in November. Of note, he is being seen by ID for possible cystitis/pyelonephritis.      Past Medical History:   Diagnosis Date   ??? Allergic rhinitis    ??? Arthritis    ??? Bipolar disorder (CMS-HCC)    ??? Cocaine use disorder, moderate, in early remission (CMS-HCC) 05/20/2016    Currently in remission Feb 2020   ??? Depression    ??? Depressive disorder    ??? GERD (gastroesophageal reflux disease)    ??? Hepatitis C     genotype 1, [facility] in prison by Dr. Anne Shutter 08/2017-11/2017 (ELB/GRAZ)   ??? HIV disease (CMS-HCC) 2005   ??? HLD (hyperlipidemia)    ??? Hypertension    ??? Kidney stone    ??? Polysubstance abuse (CMS-HCC)    ??? Psychiatric Hospitalizations    ??? Psychiatric Medication Trials    ??? Psychosis (CMS-HCC)    ??? Substance abuse (CMS-HCC)        Patient Active Problem List   Diagnosis   ??? Ventral hernia   ??? Genital warts   ??? HTN (hypertension) (RAF-HCC)   ??? HIV disease (CMS-HCC)   ??? Constipation   ??? Arthritis   ??? Tobacco abuse   ??? Acquired hallux rigidus of right foot   ??? PND (paroxysmal nocturnal dyspnea)   ??? Arthritis pain, hip   ??? Alcohol use disorder, severe, dependence (CMS-HCC)   ??? 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 Wheezing   ??? Hyperlipidemia   ??? Allergic rhinitis   ??? GERD (gastroesophageal reflux disease)   ??? ARF (acute renal failure) (CMS-HCC)   ??? Angioedema   ??? BPH (benign prostatic hyperplasia)   ??? Dyslipidemia   ??? MDD (major depressive disorder), recurrent episode, moderate (CMS-HCC)   ??? Substance induced mood disorder (CMS-HCC)   ??? Primary osteoarthritis of right hip   ??? Status post total hip replacement, right   ??? Cocaine-induced mood disorder (CMS-HCC)       Past Surgical History:   Procedure Laterality Date   ??? great toe repair Left 2012    L g toe repair r/t arthritis   ??? HERNIA REPAIR  2006   ??? PR COLSC FLX W/REMOVAL LESION BY HOT BX FORCEPS  02/09/2015    Procedure: COLONOSCOPY, FLEXIBLE, PROXIMAL TO SPLENIC FLEXURE; W/REMOVAL TUMOR/POLYP/OTHER LESION, HOT BX FORCEP/CAUTE;  Surgeon: Jolyn Lent, MD;  Location: [facility];  Service: Gastroenterology   ??? PR COLSC FLX W/RMVL OF TUMOR POLYP LESION SNARE TQ N/A 02/09/2015    Procedure: COLONOSCOPY FLEX; W/REMOV TUMOR/LES BY  SNARE;  Surgeon: Jolyn Lent, MD;  Location: [facility];  Service: Gastroenterology   ??? PR REPAIR HALLUX RIGIDUS Right 10/19/2016    Procedure: HALLUX RIGIDUS CORRECTION WITH CHEILECTOMY, DEBRIDEMENT AND CAPSULAR RELEASE OF THE FIRST METATARSOPHALANGEAL JOINT; WITHOUT IMPLANT;  Surgeon: Valda Favia, MD;  Location: [facility];  Service: Orthopedics   ??? PR UPPER GI ENDOSCOPY,BIOPSY N/A 02/09/2015    Procedure: UGI ENDOSCOPY; WITH BIOPSY, SINGLE OR MULTIPLE;  Surgeon: Jolyn Lent, MD;  Location: [facility];  Service: Gastroenterology   ??? PR UPPER GI ENDOSCOPY,BIOPSY N/A 01/06/2016    Procedure: UGI ENDOSCOPY; WITH BIOPSY, SINGLE OR MULTIPLE;  Surgeon: Rona Ravens, MD;  [facility];  Service: Gastroenterology   ??? TOTAL HIP ARTHROPLASTY Right 09/01/2019    [facility], Dr. Rosita Kea       No current facility-administered medications for this encounter.    Current Outpatient Medications:   ???  acetaminophen (TYLENOL) 500 MG tablet, Take 2 tablets (1,000 mg total) by mouth Three (3) times a day., Disp: 30 tablet, Rfl: 0  ???  albuterol HFA 90 mcg/actuation inhaler, Inhale 1 puff by mouth every six (6) hours as needed for wheezing., Disp: 8.5 g, Rfl: 3  ???  atorvastatin (LIPITOR) 40 MG tablet, Take 1 tablet (40 mg total) by mouth daily., Disp: 90 tablet, Rfl: 4  ???  bictegrav-emtricit-tenofov ala (BIKTARVY) 50-200-25 mg tablet, Take 1 tablet by mouth daily., Disp: 30 tablet, Rfl: 2  ???  buPROPion (WELLBUTRIN XL) 150 MG 24 hr tablet, Take 1 tablet (150 mg total) by mouth every morning., Disp: 90 tablet, Rfl: 3  ???  docusate sodium (COLACE) 100 MG capsule, Take 1 capsule (100 mg total) by mouth two (2) times a day as needed for constipation., Disp: 30 capsule, Rfl: 0  ???  gabapentin (NEURONTIN) 300 MG capsule, Take 2 capsules (600 mg total) by mouth Three (3) times a day., Disp: 180 capsule, Rfl: 0  ???  lidocaine 4 % patch, Place 1 patch on the skin once daily. Remove patch after 12 hours., Disp: 30 patch, Rfl: 0  ???  magnesium citrate 100 mg cap, Take 100 mg by mouth daily., Disp: 20 capsule, Rfl: 0  ???  solifenacin (VESICARE) 5 MG tablet, Take 1 tablet (5 mg total) by mouth daily. (Patient not taking: Reported on 05/23/2021), Disp: 30 tablet, Rfl: 2  ???  valsartan-hydrochlorothiazide (DIOVAN-HCT) 80-12.5 mg per tablet, Take 1 tablet by mouth daily., Disp: 90 tablet, Rfl: 3    Allergies  Amlodipine, Lactose, Lisinopril, Bee pollen, and Pollen extracts    Family History   Problem Relation Age of Onset   ??? Cancer Maternal Grandmother    ??? Hypertension Mother    ??? Depression Mother    ??? Arthritis Mother    ??? Alcohol abuse Father    ??? Cancer Brother    ??? Alcohol abuse Son    ??? Alcohol abuse Son    ??? Asthma Son    ??? Glaucoma Neg Hx        Social History  Social History     Tobacco Use   ??? Smoking status: Current Every Day Smoker     Packs/day: 3.00     Years: 45.00     Pack years: 135.00     Types: Cigarettes   ??? Smokeless tobacco: Never Used   ??? Tobacco comment: pack per day   Substance Use Topics   ??? Alcohol use:  Yes     Alcohol/week: 0.0 standard drinks     Comment: 40oz every 3 days, none for 5 months   ??? Drug use: Yes     Types: Cocaine     Comment: last used 02/14/17       Review of Systems  Constitutional: Negative for fever.  Eyes: Negative for visual changes.  ENT: Negative for sore throat.  Cardiovascular: Negative for chest pain.  Respiratory: Negative for shortness of breath.  Gastrointestinal: Positive for abdominal pain and diarrhea. Negative for vomiting.  Genitourinary: Positive for dysuria  Musculoskeletal: Negative for back pain.  Skin: Negative for rash.  Neurological: Negative for headaches, focal weakness or numbness.       Physical Exam     ED Triage Vitals [06/01/21 0844]   Vital Signs Group      Temp 36.8 ??C (98.2 ??F)      Temp Source Oral      Heart Rate 64      SpO2 Pulse       Heart Rate Source SpO2      Resp 16      BP 147/99      MAP (mmHg)       BP Location Left arm      BP Method Automatic      Patient Position Sitting   SpO2 100 %   O2 Flow Rate (L/min)    O2 Device       Constitutional: Alert and oriented. Well appearing and in no distress.  Eyes: Conjunctivae are normal.  ENT       Head: Normocephalic and atraumatic.       Nose: No congestion.       Mouth/Throat: Mucous membranes are moist.       Neck: No stridor.  Hematological/Lymphatic/Immunilogical: No cervical lymphadenopathy.  Cardiovascular: Bradycardic at 50 bpm (hx of bradycardia), regular rhythm. Normal and symmetric distal pulses are present in all extremities.  Respiratory: Normal respiratory effort. Breath sounds are normal.  Gastrointestinal: Moderately distended abdomen. Soft with tenderness to palpation diffusely, most pronounced near the umbilicus with palpable hernia. No rebound tenderness or guarding. There is no CVA tenderness.  Musculoskeletal: Normal range of motion in all extremities.       Right lower leg: No tenderness or edema.       Left lower leg: No tenderness or edema.  Neurologic: Normal speech and language. No gross focal neurologic deficits are appreciated.  Skin: Skin is warm, dry and intact. No rash noted.  Psychiatric: Mood and affect are normal. Speech and behavior are normal.       EKG     9:52 AM: Sinus bradycardia, 54 bpm. T-wave inversions in V4-V6 present on previous EKGs.      Radiology     EXAM: CT ABDOMEN PELVIS W CONTRAST  DATE: 06/01/2021 12:09 PM  IMPRESSION:  ??  1. No dilated small bowel with transition point to suggest acute obstruction as clinically questioned. Moderate retained stool seen in the colon, which Tommie Bohlken represent constipation.  2. Small amount of free fluid in dependent pelvis, nonspecific but unexpected in a male patient.  3. Multiple subcentimeter hepatic hypodensity stable from 2016 but too small to characterize.  4. Nonobstructive bilateral renal calculi. No hydronephrosis.    I evaluated this patient with Barrett Shell, MSIV. I attest that I have reviewed the student note and that the components of the history of the present illness, the physical exam, and the assessment and plan documented were performed  by me or were performed in my presence by the student where I verified the documentation and performed (or re-performed) the exam and medical decision making.          Cru Kritikos Faythe Ghee, MD  06/04/21 2310

## 2021-06-01 NOTE — Unmapped (Signed)
Abdominal pain, no nausea, hasn't been able to have a good BM in weeks. NAD noted.

## 2021-06-05 MED FILL — BUPROPION HCL XL 150 MG 24 HR TABLET, EXTENDED RELEASE: ORAL | 90 days supply | Qty: 90 | Fill #0

## 2021-06-06 NOTE — Unmapped (Signed)
Referral Services Note     Duration of Intervention: 5 minutes    REASON/TYPE OF CONTACT: Face to Face - In Person    ASSESSMENT: Pt shared that as a requirement of his housing arrangement in the [place] house, he has to work/volunteer during the days. 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 Cutler requested information that notes his hip problem so that he could present this at his house meeting tonight as he was worried about being kicked out of his housing.     INTERVENTION:  Reminded Pt that he switched his care to  [facility] and urged him to follow up with his [facility] providers (his doctor was out of the office today). Printed Pt an after visit summary of his last appointment. Provided Pt with the number to [facility] [phone] in case he needed ongoing support with this issue.     PLAN:  Pt will follow up with Internal Medicine if needed.     Lyman Speller, Darcey Nora, LCSW  [facility] Social Worker  Direct: [phone]  Main ID: [phone]

## 2021-06-08 ENCOUNTER — Ambulatory Visit
Admit: 2021-06-08 | Discharge: 2021-06-09 | Payer: MEDICAID | Attending: Student in an Organized Health Care Education/Training Program | Primary: Student in an Organized Health Care Education/Training Program

## 2021-06-08 DIAGNOSIS — M25552 Pain in left hip: Principal | ICD-10-CM

## 2021-06-08 DIAGNOSIS — I10 Essential (primary) hypertension: Principal | ICD-10-CM

## 2021-06-08 DIAGNOSIS — M1612 Unilateral primary osteoarthritis, left hip: Principal | ICD-10-CM

## 2021-06-08 DIAGNOSIS — R6889 Other general symptoms and signs: Principal | ICD-10-CM

## 2021-06-08 DIAGNOSIS — Z5941 Food insecurity: Principal | ICD-10-CM

## 2021-06-08 DIAGNOSIS — R300 Dysuria: Principal | ICD-10-CM

## 2021-06-08 DIAGNOSIS — Z550 Illiteracy and low-level literacy: Principal | ICD-10-CM

## 2021-06-08 LAB — URINALYSIS WITH CULTURE REFLEX
BILIRUBIN UA: NEGATIVE
GLUCOSE UA: NEGATIVE
KETONES UA: NEGATIVE
LEUKOCYTE ESTERASE UA: NEGATIVE
NITRITE UA: NEGATIVE
PH UA: 6 (ref 5.0–9.0)
PROTEIN UA: NEGATIVE
RBC UA: <1 /HPF (ref 0–3)
SPECIFIC GRAVITY UA: <=1.005 (ref 1.005–1.030)
SQUAMOUS EPITHELIAL: <1 /HPF (ref 0–5)
UROBILINOGEN UA: 0.2
WBC UA: 2 /HPF (ref 0–3)

## 2021-06-08 MED ORDER — CELECOXIB 100 MG CAPSULE
ORAL_CAPSULE | Freq: Two times a day (BID) | ORAL | 3 refills | 30 days | Status: CP
Start: 2021-06-08 — End: 2021-10-06
  Filled 2021-06-09: qty 60, 30d supply, fill #0

## 2021-06-08 MED ORDER — TAMSULOSIN 0.4 MG CAPSULE
ORAL_CAPSULE | Freq: Every day | ORAL | 0 refills | 30 days | Status: CP
Start: 2021-06-08 — End: 2021-07-08
  Filled 2021-06-09: qty 30, 30d supply, fill #0

## 2021-06-08 NOTE — Unmapped (Addendum)
Internal Medicine Clinic Visit    Reason for visit: Pain of L hip    A/P:           1. Hip pain, left    2. Primary hypertension    3. Food insecurity    4. Unable to read or write    5. Inability to read medication label    6. Dysuria    7. Primary osteoarthritis of left hip        Primary osteoarthritis of left hip  Advanced disease with significant pain. Anticipate he will need THA in the near future and has appointment with ortho in July. He has normal renal function so was started on a COX-2 inhibitor and will increase gabapentin as below for neuropatic pain. As it turns out, patient did not pick up those medications. Patient has UNSTEADY GAIT that requires use of ROLLING WALKER.   - Pain control with extra strength tylenol and Celecoxib (sent to appropriate pharmacy)  - Orthopedic appt 06/16/2021  - told to call back if in continued pain for more assistance  - Rollator     Urinary Discomfort - Urinary Retention  Dysuria (burning) also with dribbling and retention. Dysuria which is new concerns me for infection. Slow urinary stream, straining to void all also consistent with BPH, which I am also considering that he has.    - UA  - UCx  - Start Flomax (tamsulosin)    Addendum: Trace blood found in UA, seen in past UA for this patient and is not consistent with infection. Will treat with flomax and reject UA at a later time.     HTN - HLD:  BP in clinic 138/87, well controlled. Hypertension likely multifactorial in the setting of smoking, pain, and difficulty with medication access. Combination medication with valsartan-HCTZ at 80-12.5 mg daily was started during past visits. Known allergy to ACEi, unclear if associated with reported amlodipine allergy listed as well. Not a candidate for BB give chronic bradycardia.   - HTN3 drawn last week, no need to draw again     HIV:  Follows with [facility], well-controlled at this time. Undetectable since 2019. Reporting good adherence and tolerating medication regimen well.  - Continue Biktarvy  - Following with infectious disease (Dr. Rosemarie Beath)      Return in about 4 weeks (around 07/06/2021) for Next scheduled follow up Dr. Marca Ancona.    Staffed with Dr. Curlene Dolphin, discussed    __________________________________________________________    HPI:    Samuel Baker is a 55 y.o. male with a history of HIV with an undetectable viral load on Biktarvy (CD4 count of 539 from 5/19), gastritis, arthritis, bipolar disorder, depression, past polysubstance abuse presenting with urinary symptoms and L hip pain.     Reports having new onset burning when he urinates for the last week.  Besides that, over the last few months he also notes having difficulty producing a stream and feels that he did not empty his bladder completely after using the bathroom.  Denies associated nausea, vomiting, abdominal pain.  No fever.  Strong discomfort when he urinates.    Additionally has been having near constant left hip pain that is extremely exacerbated when he is walking bending over going uphill or going downhill.  He is only been taking Tylenol over-the-counter for this pain.  Of note he was prescribed celecoxib at the last visit, unknown if he took it.  Patient does not read or write, he is currently taking classes to learn  how to read and write.  Having difficulty reading his medication bottles.  Informed the patient that he could take another medication to assist with his left hip pain osteoarthritis and he is due to meet with orthopedic surgery 7/1 to discuss the possible procedure for his left hip.    __________________________________________________________    Problem List:  Patient Active Problem List   Diagnosis   ??? Ventral hernia   ??? Genital warts   ??? HTN (hypertension) (RAF-HCC)   ??? HIV disease (CMS-HCC)   ??? Constipation   ??? Arthritis   ??? Tobacco abuse   ??? Acquired hallux rigidus of right foot   ??? PND (paroxysmal nocturnal dyspnea)   ??? Arthritis pain, hip   ??? Alcohol use disorder, severe, dependence (CMS-HCC)   ??? 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 Wheezing   ??? Hyperlipidemia   ??? Allergic rhinitis   ??? GERD (gastroesophageal reflux disease)   ??? ARF (acute renal failure) (CMS-HCC)   ??? Angioedema   ??? BPH (benign prostatic hyperplasia)   ??? Dyslipidemia   ??? MDD (major depressive disorder), recurrent episode, moderate (CMS-HCC)   ??? Substance induced mood disorder (CMS-HCC)   ??? Primary osteoarthritis of right hip   ??? Status post total hip replacement, right   ??? Cocaine-induced mood disorder (CMS-HCC)       Medications:  Reviewed in EPIC  __________________________________________________________    Physical Exam:   Vital Signs:  Vitals:    06/08/21 1024   BP: 138/87   BP Site: L Arm   BP Position: Sitting   BP Cuff Size: Medium   Pulse: (!) 45   Resp: 18   Temp: 35.6 ??C   TempSrc: Temporal   SpO2: 96%   Weight: (!) 100.8 kg (222 lb 3.2 oz)   Height: 175.3 cm (5' 9)          Gen: Well appearing african [place] male, NAD  CV: RRR, no murmurs  Pulm: CTA bilaterally, no crackles or wheezes  Abd: Soft, NTND, normal BS. No HSM.  Ext: No edema  MSK: ROM lim 2/2 pain of L hip    PHQ-9 Score:  PHQ-9 TOTAL SCORE: 13  GAD-7 Score:       Medication adherence and barriers to the treatment plan have been addressed. Opportunities to optimize healthy behaviors have been discussed. Patient / caregiver voiced understanding.

## 2021-06-08 NOTE — Unmapped (Signed)
[facility]     Type of visit:  face to face    Reason for visit: Other/ patient stated left hip/abdomen    Questions / Concerns that need to be addressed:     General Consent to Treat (GCT) for non-epic video visits only: Verbal consent    o         Screening BP 146/93 P 47    Omron BPs (complete if screening BP has a systolic  > 139 or diastolic > 89)  BP#1 142/86 P 44   BP#2 136/86 P 45  BP#3 135/88 P 46    Average BP 138/87 P 45  (please note this as a comment in vitals)     Allergies reviewed: Yes    Medication reviewed: Yes  Pended refills? No    HCDM reviewed and updated in Epic:    We are working to make sure all of our patients??? wishes are updated in Epic and part of that is documenting a Environmental health practitioner for each patient  A Health Care Decision Maker is someone you choose who can make health care decisions for you if you are not able - who would you most want to do this for you????  was updated.    HCDM (patient stated preference): Samuel Baker,Samuel Baker) - Friend - [phone]      BPAs completed:  PHQ9       COVID-19 Vaccine Summary  Which COVID-19 Vaccine was administered  Pfizer  Type:  Dates Given:  05/04/2021                     Immunization History   Administered Date(s) Administered   ??? COVID-19 VACC,(JANSSEN)(PF)(IM) 06/04/2020   ??? COVID-19 VACC,MRNA,(PFIZER)(PF)(IM) 12/15/2020, 05/04/2021   ??? Hepatitis A 07/04/2017, 08/05/2017   ??? Hepatitis B, Adult 10/12/2008, 02/15/2009, 07/04/2017, 08/05/2017   ??? INFLUENZA TIV (TRI) PF (IM) 10/12/2008, 09/30/2009, 12/05/2010, 09/02/2012   ??? Influenza Vaccine Quad (IIV4 PF) 85mo+ injectable 08/21/2013, 09/03/2014, 10/31/2015, 10/02/2016, 02/21/2017, 01/14/2019, 09/21/2020   ??? Influenza, (QUAD) Intradermal PF (18-81yrs) 02/21/2017   ??? Meningococcal C Conjugate 07/04/2017   ??? PNEUMOCOCCAL POLYSACCHARIDE 23 04/20/2008, 08/25/2013, 02/21/2017   ??? PPD Test 10/12/2008, 02/15/2009, 03/08/2009, 01/03/2010, 04/11/2010, 06/19/2011, 09/02/2012   ??? Pneumococcal Conjugate 13-Valent 12/02/2012   ??? SHINGRIX-ZOSTER VACCINE (HZV), RECOMBINANT,SUB-UNIT,ADJUVANTED IM 12/28/2020   ??? TdaP 04/20/2008, 02/09/2019       __________________________________________________________________________________________    SCREENINGS COMPLETED IN FLOWSHEETS    HARK Screening       AUDIT       PHQ2  PHQ-2 Total Score : 3    PHQ9  Thoughts that you would be better off dead, or of hurting yourself in some way: Not at all  PHQ-9 TOTAL SCORE: 13    P4 Suicidality Screener                GAD7       COPD Assessment       Falls Risk

## 2021-06-12 ENCOUNTER — Ambulatory Visit: Admit: 2021-06-12 | Discharge: 2021-06-12 | Discharge status: Against Medical Advice | Payer: MEDICAID

## 2021-06-12 NOTE — Unmapped (Signed)
Pt bib [facility] for dizziness, nausea, emesis, cough and fever.

## 2021-06-16 ENCOUNTER — Ambulatory Visit: Admit: 2021-06-16 | Discharge: 2021-06-17 | Payer: MEDICAID

## 2021-06-16 NOTE — Unmapped (Signed)
CC: Hip Pain (EVALUATE LEFT PAIN)     HPI:  55 y.o. male presents for evaluation of left hip pain.  Reports a little over a year of worsening symptoms.  Specifically describes pain and throbbing in his left groin.  He also endorses numbness down his left leg and intermittent back pain.  His predominant limiting factor is the groin pain.  It is worse with weightbearing.  He is interested in proceeding with left total hip arthroplasty and has been told in the past that he has severe arthritis of the left hip.    He takes gabapentin and celebrex for pain control with little relief. Has tried PT. No history of hip injections.    He is status post right total hip arthroplasty in [facility] by Dr. Rosita Kea one year ago.  Reports being diagnosed with an infection 6 months postoperatively and had 1 subsequent surgery and IV antibiotics. Not on any oral antibiotics since that time. Denies any concerns with the right hip at this time including erythema, warmth, or drainage.     ROS: Negative for fever, chills, chest pain, cough, SOB.    PMH/PSH:  Pertinent history includes:  -HIV (Follows at [facility], undetectable since 2019, CD4 count of 539 from 5/19, on Biktarvy)  -Hepatitis C (treated, undetectable)  -Bipolar disorder  -Hx of polysubstance abuse (reports abstinence for 3 months)  -Hypertension  -Borderline diabetes (A1c 5.8)  -Hx of bradycardia (negative stress test per patient 3-4 years ago)    Denies cardiac history, seizures, cancer, history of VTE.    Social Hx:  Smokes about 5 cigarettes per day, working on cessation. Previously quit for RTHA.  Hx of substance abuse, reports abstinence for 3 months.  Lives at the [facility] (rehab facility)  Dental: history of full extraction  Engaged, fiance lives in [place]  Can not read or write    Physical Exam:  General: Alert, no acute distress.   Estimated body mass index is 31.57 kg/m?? as calculated from the following:    Height as of this encounter: 177.8 cm (5' 10). Weight as of this encounter: 99.8 kg (220 lb).   Respiratory: Non labored respirations.  Integumentary: No obvious rashes, lesions, or open wounds.   Musculoskeletal: Ambulating independently with a limp. Has a rollator walker with him. LLE: No open wounds or erythema. Trochanteric bursa tender. Painful, restricted internal and external rotation of hip. 4+/5 strength with hip adduction, abduction, and flexion. NV intact distally with 2+ DP pulse.     Imaging:  Radiographs of left hip reviewed showing moderate to severe degenerative changes. No evidence of fracture or dislocation. Partial views of right hip with well fixed and well positioned right total hip arthroplasty.     Assessment:  Left hip osteoarthritis, moderate to severe  Hx of right total hip arthroplasty with post-operative infection    Plan:  55 y.o. male with moderate to severe left hip osteoarthritis. We discussed the natural history of osteoarthritis and reviewed the imaging studies together.  We discussed treatment options to include continued conservative management with medications, injections, and activity modifications versus surgical intervention, specifically total hip arthroplasty.      We discussed that I have concerns regarding his increased risk for infection postoperatively based on his history of right hip infection following his right total hip arthroplasty.  Patient would still like to proceed.  We reviewed the surgery requirements as follows:    -HIV management: Undetectable viral load, CD4 count greater than 400, on medications,  follows with infectious disease team at [facility] -requirement met  -Hepatitis C treated -requirement met  -Stable living environment -requirement met  -Continued abstinence from any drug or alcohol use -patient demonstrated agreement, will drug screen at preoperative visit if surgery scheduled  -Primary care clearance -I will reach out to his PCP  -Smoking cessation -patient will continue to work on this, understands that he would have a nicotine screen at preoperative visit if surgery scheduled  -Approval by our total joint surgeon --we discussed that today's appointment is not a guarantee that he will be offered left hip replacement, especially with his significant history for right hip infection. He has done a good job changing his social determinants of health.    In the meantime we will try to manage this conservatively and he was scheduled for a left hip intra-articular steroid injection.  If this is beneficial it would be a good way to avoid surgical intervention.    Follow up: next available with Dr. Daiva Huge or Dr. Orlean Bradford after hip injection to discuss response to injection and further discuss surgery     Patient demonstrated understanding and was in agreement with the plan outlined above.    This note was transcribed with the use of dictation software, please excuse any typos or grammatical errors.

## 2021-07-03 DIAGNOSIS — G629 Polyneuropathy, unspecified: Principal | ICD-10-CM

## 2021-07-03 MED ORDER — GABAPENTIN 300 MG CAPSULE
ORAL_CAPSULE | Freq: Three times a day (TID) | ORAL | 0 refills | 30.00000 days | Status: CP
Start: 2021-07-03 — End: 2021-08-02
  Filled 2021-07-05: qty 180, 30d supply, fill #0

## 2021-07-06 ENCOUNTER — Ambulatory Visit
Admit: 2021-07-06 | Discharge: 2021-07-07 | Payer: MEDICAID | Attending: Student in an Organized Health Care Education/Training Program | Primary: Student in an Organized Health Care Education/Training Program

## 2021-07-06 NOTE — Unmapped (Unsigned)
SPORTS MEDICINE RETURN VISIT    ASSESSMENT AND PLAN     Problem List Items Addressed This Visit    None     Visit Diagnoses     Primary localized osteoarthritis of left hip    -  Primary    Chronic left hip pain              Left hip pain - Symptomatic advanced osteoarthrosis, refractory to rest, activity modification, physical therapy, over-the-counter and and prescription medications.  Undergoing evaluation for candidacy for total hip arthroplasty.  --Reviewed with the patient the etiology, natural history and progressive nature of osteoarthritis, along with available treatment options and indications to pursue each  --Patient consented for ultrasound-guided intra-articular glucocorticoid injection to the left hip joint as documented below  --Return if symptoms worsen or fail to improve.     Consent - Hip joint injection  After discussing the various treatment options for the condition, it was agreed that corticosteroid injection would be the next step in treatment. The nature of and the indications for injection of corticosteroid and local anaesthetic were reviewed in detail with the patient today. The inherent risks of injection, including infection, bleeding, allergic reaction, increased pain, incomplete relief or temporary relief of symptoms, alterations of blood glucose levels requiring careful monitoring and treatment as indicated, tendon, ligament or articular cartilage rupture or degeneration, nerve injury, skin depigmentation, and/or fatty atrophy were discussed. The patient expressed understanding and consented to proceed.   Procedure   After the risks and benefits of the procedure were explained, verbal consent was given, and a procedural time-out was performed. The hip was palpated and the correct anatomical landmarks were used to identify the approach to the hip joint. The hip joint and surrounding structures were then visualized with ultrasound. The site for the injection was properly marked and prepped with chlorhexidine solution. The injection site was anesthetized with ethyl chloride and 4 milliliters of 1% lidocaine with a 25 gauge 2 inch needle. Using ultrasound guidance, the hip joint was visualized and injected with 20 milligrams of Kenalog (triamcinolone acetonide) and 4 milliliters of normal saline using a sterile technique and a 22 gauge 2.5 inch needle. During the injection, there was unrestricted flow and care was taken not to inject corticosteroid into the skin or subcutaneous tissues. There were no complications during the procedure.  Post-procedure  A sterile adhesive bandage was applied. Instructions were given regarding post-injection care, when to follow up in clinic and what to expect from the procedure. The patient tolerated the procedure well and was discharged without immediate complication.    NEED FOR SONOGRAPHIC GUIDANCE  Given the complexity of this problem and the anatomic location of this structure, sonographic guidance is recommended to prevent injury to neurovascular structures and confirm accuracy of injection. The accuracy of doing these injections blind is poor and the benefit to the patient by using ultrasound guidance is significant to avoid complications.     Reference:  American Medical Society for Sports Medicine (AMSSM) position statement: interventional musculoskeletal ultrasound in sports medicine.  Morey Hummingbird MM, Adams E, Berkoff D, Concoff AL, Jiles Crocker J Sports Med. 2014 Oct 20. pii: [id number]. doi: 10.1136/bjsports-[id number]      SUBJECTIVE     Chief Complaint:   Chief Complaint   Patient presents with   ??? Hip Pain     Left       History of Present Illness: 55 y.o. male who presents for left hip  pain.  History significant for advanced right hip osteoarthrosis status post total hip arthroplasty 09/01/2019 with Dr. Rosita Kea, complicated by infection requiring washout 05/26/2020.  He attended 2-week follow-up 06/09/2020 and does not appear polysubstance abuse (currently lives at [facility]) and bipolar disorder among other medical issues.  His left hip pain has become more prominent over the past year.  Pain localizes primarily to the groin, exacerbated by weightbearing activity and hip flexion, somewhat improved at rest.  He has tried physical therapy exercises, over-the-counter medications, celecoxib and gabapentin without significant relief.  He saw Avie Arenas 06/16/2021 and is being considered for left total hip arthroplasty, with some hurdles yet to clear in light of his prior right hip infection and polysubstance abuse.  He has not had injections in his left hip.  He also has some left-sided back pain with radiation into the left lower extremity, but this does not really limit his activity is much as the hip does.    Past Medical History:   Past Medical History:   Diagnosis Date   ??? Allergic rhinitis    ??? Arthritis    ??? Bipolar disorder (CMS-HCC)    ??? Cocaine use disorder, moderate, in early remission (CMS-HCC) 05/20/2016    Currently in remission Feb 2020   ??? Depression    ??? Depressive disorder    ??? GERD (gastroesophageal reflux disease)    ??? Hepatitis C     genotype 1, [facility] by Dr. Anne Shutter 08/2017-11/2017 (ELB/GRAZ)   ??? HIV disease (CMS-HCC) 2005   ??? HLD (hyperlipidemia)    ??? Hypertension    ??? Kidney stone    ??? Polysubstance abuse (CMS-HCC)    ??? Psychiatric Hospitalizations    ??? Psychiatric Medication Trials    ??? Psychosis (CMS-HCC)    ??? Substance abuse (CMS-HCC)      Past Surgical History:  has a past surgical history that includes Hernia repair (2006); great toe repair (Left, 2012); pr colsc flx w/rmvl of tumor polyp lesion snare tq (N/A, 02/09/2015); pr upper gi endoscopy,biopsy (N/A, 02/09/2015); pr colsc flx w/removal lesion by hot bx forceps (02/09/2015); pr upper gi endoscopy,biopsy (N/A, 01/06/2016); pr repair hallux rigidus (Right, 10/19/2016); and Total hip arthroplasty (Right, 09/01/2019).  Medications:   Current Outpatient Medications:   ???  acetaminophen (TYLENOL) 500 MG tablet, Take 2 tablets (1,000 mg total) by mouth Three (3) times a day., Disp: 30 tablet, Rfl: 0  ???  albuterol HFA 90 mcg/actuation inhaler, Inhale 1 puff by mouth every six (6) hours as needed for wheezing., Disp: 8.5 g, Rfl: 3  ???  atorvastatin (LIPITOR) 40 MG tablet, Take 1 tablet (40 mg total) by mouth daily., Disp: 90 tablet, Rfl: 4  ???  bictegrav-emtricit-tenofov ala (BIKTARVY) 50-200-25 mg tablet, Take 1 tablet by mouth daily., Disp: 30 tablet, Rfl: 2  ???  buPROPion (WELLBUTRIN XL) 150 MG 24 hr tablet, Take 1 tablet (150 mg total) by mouth every morning., Disp: 90 tablet, Rfl: 3  ???  celecoxib (CELEBREX) 100 MG capsule, Take 1 capsule (100 mg total) by mouth Two (2) times a day., Disp: 60 capsule, Rfl: 3  ???  gabapentin (NEURONTIN) 300 MG capsule, Take 2 capsules (600 mg total) by mouth Three (3) times a day., Disp: 180 capsule, Rfl: 0  ???  lidocaine 4 % patch, Place 1 patch on the skin once daily. Remove patch after 12 hours., Disp: 30 patch, Rfl: 0  ???  magnesium citrate 100 mg cap, Take 100 mg by mouth  daily., Disp: 20 capsule, Rfl: 0  ???  solifenacin (VESICARE) 5 MG tablet, Take 1 tablet (5 mg total) by mouth daily., Disp: 30 tablet, Rfl: 2  ???  tamsulosin (FLOMAX) 0.4 mg capsule, Take 1 capsule (0.4 mg total) by mouth in the morning., Disp: 30 capsule, Rfl: 0  ???  valsartan-hydrochlorothiazide (DIOVAN-HCT) 80-12.5 mg per tablet, Take 1 tablet by mouth daily., Disp: 90 tablet, Rfl: 3      OBJECTIVE     Physical Exam:  Vitals:   Wt Readings from Last 3 Encounters:   06/16/21 99.8 kg (220 lb)   06/08/21 (!) 100.8 kg (222 lb 3.2 oz)   04/28/21 96.2 kg (212 lb)     Estimated body mass index is 31.57 kg/m?? as calculated from the following:    Height as of 06/16/21: 177.8 cm (5' 10).    Weight as of 06/16/21: 99.8 kg (220 lb).  Gen: Well-appearing male in no acute distress  MSK: LEFT HIP -  Antalgic gait.  TTP over the anterior soft tissues and greater trochanter.  110 degrees of flexion, 15 degrees of internal rotation and 40 degrees of external rotation.  4+/5 strength in adduction, abduction and hip flexion both initiated with hip extended and initiated with hip flexed.  Positive Ober, positive FABER, positive FADIR, positive labral provocative testing, negative straight leg raise, negative clamshell.  Neurovascularly intact.    Imaging: Supine radiographs of the left hip 03/23/2021 demonstrate at least moderately advanced osteoarthrosis.    I have personally reviewed and interpreted the images.  I have personally reviewed prior records and incorporated relevant information above.    MEDICAL DECISION MAKING (level of service defined by 2/3 elements)     Number/Complexity of Problems Addressed 1 undiagnosed new problem with uncertain prognosis (99204/99214)   Amount/Complexity of Data to be Reviewed/Analyzed Meets at least 2 of the 3 MODERATE criteria above (99205/99215)   Risk of Complications/Morbidity/Mortality of Management Decision for MINOR Surgery (Including Injection) WITHOUT Risk Factors (99203/99213)     TIME     Total Time for E/M Services on the Date of Encounter Time-based coding not utilized for this encounter     MODIFIER -25     Significant, Separately Billable Evaluation and Management YES - The patient's condition required a significant, separately identifiable, medical necessary evaluation and management service by the provider in addition to the procedure performed on the same date of service. The evaluation and management service was above and beyond the usual preoperative and postoperative care associated with the procedure.

## 2021-07-06 NOTE — Unmapped (Signed)
You received a corticosteroid injection to reduce pain and inflammation.  Please note that it can take up to 2 weeks for this injection to fully work.  While many people will feel relief sooner, please be patient.    The injection contained a corticosteroid and a numbing agent.  The numbing agent can last for 1-6 hours.  After this wears off you may have increased pain until the steroid has a chance to work.    Unless you have been directed otherwise, you may take over-the-counter pain medications and apply ice to the affected area as needed. If you have prescription medications for treatment of your condition, you may take these according to the prescription instructions.    You can resume your normal daily activities, but consider resting the injected area for the next few days.      What are some of the possible side effects of a steroid injection?    Common side effects:  temporarily elevated blood sugar (in diabetic patients) that can last a few days   flushing of the skin, especially the face  temporary rise in blood pressure  discoloration or atrophy of the skin at the injection site    Call your doctor at once if you have:  persistent worsening pain or swelling, fever;  blurred vision, tunnel vision, eye pain, or seeing halos around lights;  fast or slow heartbeats;  increased blood pressure that is associated with severe headache, blurred vision, pounding in your neck or ears, anxiety, nosebleed;  headaches, ringing in your ears, dizziness, nausea, vision problems, pain behind your eyes    This is not a complete list of side effects and others may occur. Call your doctor for medical advice about side effects.     What other drugs may be affected after the injection?  Many drugs can interact with steroids. Not all possible interactions are listed here. Tell your doctor about all your current medicines and any you start or stop using, especially:  an antibiotic or antifungal medication;  birth control pills or hormone replacement therapy;  a blood thinner (warfarin, Coumadin, and others);  a diuretic or water pill;  insulin or oral diabetes medicine;  medicine to treat tuberculosis;  a nonsteroidal anti-inflammatory drug or NSAID (aspirin, ibuprofen, naproxen, diclofenac, indomethacin, Advil, Aleve, Celebrex, and many others); or  seizure medication.      You can reach our office through My [facility] Chart ([email or web address]) or by phone at [phone].    We will see you back as scheduled. If you need to schedule another appointment, please call [phone].    Thank you for coming to [facility] and the [facility], where we strive to provide innovative and comprehensive patient-centered care based on research evidence. We appreciate you choosing [facility] for your care, and we look forward to seeing you again for any future needs!      Sincerely,    Sabino Dick, MD

## 2021-07-13 ENCOUNTER — Ambulatory Visit: Admit: 2021-07-13 | Discharge: 2021-07-14 | Payer: MEDICAID

## 2021-07-13 DIAGNOSIS — R300 Dysuria: Principal | ICD-10-CM

## 2021-07-13 DIAGNOSIS — M1612 Unilateral primary osteoarthritis, left hip: Principal | ICD-10-CM

## 2021-07-13 DIAGNOSIS — I10 Essential (primary) hypertension: Principal | ICD-10-CM

## 2021-07-13 DIAGNOSIS — N401 Enlarged prostate with lower urinary tract symptoms: Principal | ICD-10-CM

## 2021-07-13 DIAGNOSIS — M25552 Pain in left hip: Principal | ICD-10-CM

## 2021-07-13 MED ORDER — TAMSULOSIN 0.4 MG CAPSULE
ORAL_CAPSULE | Freq: Every day | ORAL | 0 refills | 30 days | Status: CP
Start: 2021-07-13 — End: 2021-08-12

## 2021-07-13 NOTE — Unmapped (Signed)
Internal Medicine Clinic Visit    Reason for visit: Follow-up dysuria    A/P: Samuel Baker is a 55 year old male with history of HIV (on Biktarvy), HTN, osteoarthritis of L hip with chronic pain and recent concerns for BPH who presents with ongoing dysuria.            1. Dysuria    2. Primary hypertension    3. Hip pain, left    4. Primary osteoarthritis of left hip    5. Benign prostatic hyperplasia with lower urinary tract symptoms, symptom details unspecified      Dysuria - Concern for BPH:  Patient with ongoing dysuria and sensation of incomplete emptying since last visit. UA on prior visit with trace blood but otherwise bland. Symptoms continue today, no improvement with tamsulosin. No fevers, discharge, or clear infectious concerns at this time. Does admit to divergent stream, although on exam no clear abnormalities. Bladder scan with 37ml. It is unclear at this time the etiology of his urinary symptoms, as I would not expect BPH to contribute to dysuria. Will re-investigate and engage urology.  -- Repeat UA  -- Obtained GC/chlamydia swab  -- PSA  -- Continue flomax  -- Urology referral    Primary Osteoarthritis of L Hip - Chronic Pain:  Advanced disease, chronic pain. Much improved upon visit today. Seen by orthopedics with joint injection which he found quite beneficial. Continuing on Tylenol/Celecoxib. Pain well controlled at this time, using rollator. Will check creatinine this visit.   -- HTN3  -- Continue pain regimen  -- Following with orthopedics  ??  HTN - HLD:  BP prior 138/87. BP today of 118/79. Well controlled, tolerating medications well.  -- Continue valsartan-hydrochlorothiazide  -- HTN3 as above   ??  HIV:  Follows with [facility], well-controlled at this time. Undetectable since 2019. Reporting good adherence and tolerating medication regimen well.  - Continue Biktarvy  - Following with infectious disease??(Dr. Rosemarie Beath)      Return in about 6 months (around 01/13/2022).    Staffed with Dr. Madelon Lips, discussed    __________________________________________________________    HPI: Samuel Baker is a 55 year old male with history of HIV (on Biktarvy), HTN, osteorthritis of L hip with chronic pain and recent concerns for BPH who presents with ongoing dysuria.     He has been having continuing dysuria since his last visit. He describes it as a burning sensation. He has also noticed a sensation of incomplete emptying and a divergent stream with urination. He denies any abdominal pain. He has not had any fevers, chills, or CVA tenderness.     He was seen by orthopedics and received a joint injection which he felt was very helpful. He has been able to ambulate more easily and can go up stairs. He is following with them for possible surgical evaluation. He has been consistent with his medication regimen.   __________________________________________________________    Problem List:  Patient Active Problem List   Diagnosis   ??? Ventral hernia   ??? Genital warts   ??? HTN (hypertension) (RAF-HCC)   ??? HIV disease (CMS-HCC)   ??? Constipation   ??? Arthritis   ??? Tobacco abuse   ??? Acquired hallux rigidus of right foot   ??? PND (paroxysmal nocturnal dyspnea)   ??? Arthritis pain, hip   ??? Alcohol use disorder, severe, dependence (CMS-HCC)   ??? 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 Wheezing   ??? Hyperlipidemia   ???  Allergic rhinitis   ??? GERD (gastroesophageal reflux disease)   ??? ARF (acute renal failure) (CMS-HCC)   ??? Angioedema   ??? BPH (benign prostatic hyperplasia)   ??? Dyslipidemia   ??? MDD (major depressive disorder), recurrent episode, moderate (CMS-HCC)   ??? Substance induced mood disorder (CMS-HCC)   ??? Primary osteoarthritis of right hip   ??? Status post total hip replacement, right   ??? Cocaine-induced mood disorder (CMS-HCC)       Medications:  Reviewed in EPIC  __________________________________________________________    Physical Exam:   Vital Signs:  Vitals:    07/13/21 0850   BP: 118/79   Pulse: 58   Resp: 16   Temp: 35.8 ??C (96.4 ??F)   TempSrc: Temporal   SpO2: 98%   Weight: (!) 101.6 kg (224 lb)   Height: 175.3 cm (5' 9)          Gen: Well appearing, NAD  CV: RRR, no murmurs  Pulm: CTA bilaterally, no crackles or wheezes  Abd: Soft, NTND, normal BS.  Ext: No edema  MSK: Limited ROM secondary to pain of L hip      PHQ-9 Score:     GAD-7 Score:       Medication adherence and barriers to the treatment plan have been addressed. Opportunities to optimize healthy behaviors have been discussed. Patient / caregiver voiced understanding.

## 2021-07-13 NOTE — Unmapped (Signed)
[facility] at [facility]     Type of visit:  face to face    Reason for visit: fu BP    Questions / Concerns that need to be addressed: none    General Consent to Treat (GCT) for non-epic video visits only: Verbal consent      Screening BP- 118/79    Allergies reviewed: Yes    Medication reviewed: Yes  Pended refills? No    HCDM reviewed and updated in Epic:    We are working to make sure all of our patients??? wishes are updated in Epic and part of that is documenting a Environmental health practitioner for each patient  A Health Care Decision Maker is someone you choose who can make health care decisions for you if you are not able - who would you most want to do this for you????  is already up to date.    HCDM (patient stated preference): Dixon,Elizabeth Steffanie Rainwater) - Friend - [phone]      BPAs completed:        COVID-19 Vaccine Summary  Which COVID-19 Vaccine was administered  Pfizer  Type:  Dates Given:  05/04/2021                   If no: Are you interested in scheduling?     Immunization History   Administered Date(s) Administered   ??? COVID-19 VACC,(JANSSEN)(PF)(IM) 06/04/2020   ??? COVID-19 VACC,MRNA,(PFIZER)(PF)(IM) 12/15/2020, 05/04/2021   ??? Hepatitis A 07/04/2017, 08/05/2017   ??? Hepatitis B, Adult 10/12/2008, 02/15/2009, 07/04/2017, 08/05/2017   ??? INFLUENZA TIV (TRI) PF (IM) 10/12/2008, 09/30/2009, 12/05/2010, 09/02/2012   ??? Influenza Vaccine Quad (IIV4 PF) 49mo+ injectable 08/21/2013, 09/03/2014, 10/31/2015, 10/02/2016, 02/21/2017, 01/14/2019, 09/21/2020   ??? Influenza, (QUAD) Intradermal PF (18-54yrs) 02/21/2017   ??? Meningococcal C Conjugate 07/04/2017   ??? PNEUMOCOCCAL POLYSACCHARIDE 23 04/20/2008, 08/25/2013, 02/21/2017   ??? PPD Test 10/12/2008, 02/15/2009, 03/08/2009, 01/03/2010, 04/11/2010, 06/19/2011, 09/02/2012   ??? Pneumococcal Conjugate 13-Valent 12/02/2012   ??? SHINGRIX-ZOSTER VACCINE (HZV), RECOMBINANT,SUB-UNIT,ADJUVANTED IM 12/28/2020, 07/12/2021   ??? TdaP 04/20/2008, 02/09/2019 ??? Tetanus-Diptheria Toxoids-TD(TDVAX),Asdorbed,2LF(IM) 05/17/2006       __________________________________________________________________________________________    SCREENINGS COMPLETED IN FLOWSHEETS    HARK Screening       AUDIT       PHQ2       PHQ9          P4 Suicidality Screener                GAD7       COPD Assessment       Falls Risk

## 2021-07-13 NOTE — Unmapped (Addendum)
It was great to see you today!    We will check some labs for your ongoing pain with urinating. We will also check on your kidneys. I've placed a referral for you to be seen by urology, so please look for them to contact you.     No changes to your medications at this time.

## 2021-07-13 NOTE — Unmapped (Signed)
Bladder scan done: 36 mL

## 2021-07-19 NOTE — Unmapped (Signed)
Patient left message on nurse triage line with a complaint of placement of dressing from a shingles vaccine.  Patient reports nurse put band-aid 2 inches above the hole and when I got home there was blood on my shirt and down my arm.    Upon review of the chart, patient was not recently seen at [facility].  Shingles vaccine, per immunization history, was administered on 07/13/2021 at [facility].    Routed message to nurse manager for review.

## 2021-07-20 NOTE — Unmapped (Signed)
Attempted to reach patient to discuss his concerns but no answer at number provided. Unable to leave a VM since his voicemail is not set up at this time per recording.

## 2021-07-24 NOTE — Unmapped (Signed)
Called and spoke to the patient about his complaint. He states that he was in our office on 07/13/21 and had blood work taken by an IM care partner. The IM care partner placed the dressing above the area and blood got on his shirt and he had to throw away this new shirt. This nurse apologized for the error and informed the patient I would address with the care partner to prevent it in the future. Pt stated okay and call was ended.

## 2021-08-04 DIAGNOSIS — N419 Inflammatory disease of prostate, unspecified: Principal | ICD-10-CM

## 2021-08-04 MED ORDER — LEVOFLOXACIN 500 MG TABLET
ORAL_TABLET | Freq: Every day | ORAL | 0 refills | 28.00000 days | Status: CP
Start: 2021-08-04 — End: 2021-09-01
  Filled 2021-08-04: qty 28, 28d supply, fill #0

## 2021-08-18 NOTE — Unmapped (Signed)
[facility] Specialty Pharmacy Refill Coordination Note    Specialty Medication(s) to be Shipped:   Infectious Disease: Biktarvy    Other medication(s) to be shipped: atorvastatin and valsartan/hctz     Samuel Baker, DOB: 27-Aug-1966  Phone: [phone] (home)       All above HIPAA information was verified with patient.     Was a Nurse, learning disability used for this call? No    Completed refill call assessment today to schedule patient's medication shipment from the [facility] [phone]).  All relevant notes have been reviewed.     Specialty medication(s) and dose(s) confirmed: Regimen is correct and unchanged.   Changes to medications: Samuel Baker reports no changes at this time.  Changes to insurance: No  New side effects reported not previously addressed with a pharmacist or physician: None reported  Questions for the pharmacist: No    Confirmed patient received a Conservation officer, historic buildings and a Surveyor, mining with first shipment. The patient will receive a drug information handout for each medication shipped and additional FDA Medication Guides as required.       DISEASE/MEDICATION-SPECIFIC INFORMATION        N/A    SPECIALTY MEDICATION ADHERENCE     Medication Adherence    Patient reported X missed doses in the last month: 0  Specialty Medication: BIKTARVY  Patient is on additional specialty medications: No              Were doses missed due to medication being on hold? No    Biktarvy 50-200-25 mg: 10 days of medicine on hand        REFERRAL TO PHARMACIST     Referral to the pharmacist: Not needed      [facility]     Shipping address confirmed in Epic.     Delivery Scheduled: Yes, Expected medication delivery date: 08/24/21.     Medication will be delivered via Next Day Courier to the prescription address in Epic WAM.    Samuel Baker   [facility] Specialty Technician

## 2021-08-23 MED FILL — ATORVASTATIN 40 MG TABLET: ORAL | 90 days supply | Qty: 90 | Fill #1

## 2021-08-23 NOTE — Unmapped (Signed)
Patient aware Samuel Baker was canceled because it refill too soon  filled at a different pharmacy.

## 2021-08-30 ENCOUNTER — Ambulatory Visit: Admit: 2021-08-30 | Discharge: 2021-08-31 | Payer: MEDICAID

## 2021-08-30 DIAGNOSIS — M1612 Unilateral primary osteoarthritis, left hip: Principal | ICD-10-CM

## 2021-08-30 DIAGNOSIS — I10 Essential (primary) hypertension: Principal | ICD-10-CM

## 2021-08-30 DIAGNOSIS — M25552 Pain in left hip: Principal | ICD-10-CM

## 2021-08-30 DIAGNOSIS — M1611 Unilateral primary osteoarthritis, right hip: Principal | ICD-10-CM

## 2021-08-30 DIAGNOSIS — B20 Human immunodeficiency virus [HIV] disease: Principal | ICD-10-CM

## 2021-08-30 DIAGNOSIS — R300 Dysuria: Principal | ICD-10-CM

## 2021-08-30 MED ORDER — LIDOCAINE 5 % TOPICAL PATCH
MEDICATED_PATCH | Freq: Two times a day (BID) | TRANSDERMAL | 3 refills | 5.00000 days | Status: CP
Start: 2021-08-30 — End: 2021-09-19

## 2021-08-30 MED ORDER — DICLOFENAC 1 % TOPICAL GEL
Freq: Four times a day (QID) | TOPICAL | 1 refills | 13 days | Status: CP
Start: 2021-08-30 — End: 2021-09-24
  Filled 2021-08-30: qty 100, 13d supply, fill #0

## 2021-08-30 NOTE — Unmapped (Addendum)
It was great to see you today!    For your left hip pain, I will reach out to your orthopedic surgeons to see about the timing of your surgery and see if you can be seen sooner.     As for your pain:  Tylenol: You may take 1000 mg every 8 hours. Do not exceed 3000 mg in one day.   Celebrex: You may take 200 mg TWICE a day.   Please start using voltaren gel and lidocaine patches. If it is too expensive from the pharmacy, try over the counter.    Check in with [facility] to ensure the date we discussed is an appointment for you.

## 2021-08-30 NOTE — Unmapped (Signed)
I saw and evaluated the patient, participating in the key portions of the service.  I reviewed the resident’s note.  I agree with the resident’s findings and plan. Cicily Bonano B Rushie Brazel, MD

## 2021-08-30 NOTE — Unmapped (Signed)
[facility] at [facility]       Type of visit:  face to face    Reason for visit: follow up    Questions / Concerns that need to be addressed: pre op  General Consent to Treat (GCT) for non-epic video visits only: Verbal consent  148/92, 53  Omron BPs (complete if screening BP has a systolic  > 129 or diastolic > 79)  BP#1  142/95, 56  BP#2  146/95, 56  BP#3  144/90, 53    Average BP   (please note this as a comment in vitals)     Allergies reviewed: Yes    Medication reviewed: Yes  Pended refills? Yes        HCDM reviewed and updated in Epic:    We are working to make sure all of our patients??? wishes are updated in Epic and part of that is documenting a Environmental health practitioner for each patient  A Health Care Decision Maker is someone you choose who can make health care decisions for you if you are not able - who would you most want to do this for you????  was updated.        BPAs completed:  Falls Risk - adults 65+      COVID-19 Vaccine Summary  Which COVID-19 Vaccine was administered  Pfizer  Type:  Dates Given:  05/04/2021                         Immunization History   Administered Date(s) Administered   ??? COVID-19 VAC,MRNA,TRIS(12Y UP)(PFIZER)(IM)(PF) 05/04/2021   ??? COVID-19 VACC,(JANSSEN)(PF)(IM) 06/04/2020   ??? COVID-19 VACC,MRNA,(PFIZER)(PF)(IM) 12/15/2020   ??? Hepatitis A 07/04/2017, 08/05/2017   ??? Hepatitis B, Adult 10/12/2008, 02/15/2009, 07/04/2017, 08/05/2017   ??? INFLUENZA TIV (TRI) PF (IM) 10/12/2008, 09/30/2009, 12/05/2010, 09/02/2012   ??? Influenza Vaccine Quad (IIV4 PF) 67mo+ injectable 08/21/2013, 09/03/2014, 10/31/2015, 10/02/2016, 02/21/2017, 01/14/2019, 09/21/2020   ??? Influenza, (QUAD) Intradermal PF (18-51yrs) 02/21/2017   ??? Meningococcal C Conjugate 07/04/2017   ??? PNEUMOCOCCAL POLYSACCHARIDE 23 04/20/2008, 08/25/2013, 02/21/2017   ??? PPD Test 10/12/2008, 02/15/2009, 03/08/2009, 01/03/2010, 04/11/2010, 06/19/2011, 09/02/2012   ??? Pneumococcal Conjugate 13-Valent 12/02/2012 ??? SHINGRIX-ZOSTER VACCINE (HZV), RECOMBINANT,SUB-UNIT,ADJUVANTED IM 12/28/2020, 07/12/2021   ??? TdaP 04/20/2008, 02/09/2019   ??? Tetanus-Diptheria Toxoids-TD(TDVAX),Asdorbed,2LF(IM) 05/17/2006       __________________________________________________________________________________________    SCREENINGS COMPLETED IN FLOWSHEETS    HARK Screening       AUDIT       PHQ2       PHQ9          P4 Suicidality Screener                GAD7       COPD Assessment       Falls Risk  Falls Risk  Have you fallen in the past year?: No  Do you feel unsteady when standing or walking?: (!) Yes    .imcres

## 2021-08-30 NOTE — Unmapped (Signed)
Internal Medicine Clinic Visit    Reason for visit: Follow-up L Hip pain    A/P: Samuel Baker is a 55 year old male with a past medical history significant for HIV, osteoarthritis of hips bilaterally, HTN/HLD, and dysuria who presents today for follow-up of his left hip pain.           1. Primary osteoarthritis of left hip    2. Primary hypertension    3. Primary osteoarthritis of right hip    4. Hip pain, left    5. Dysuria    6. HIV disease (CMS-HCC)      Primary Osteoarthritis of L Hip - Chronic Pain:  Advanced disease, chronic pain. Continued on Tylenol/Celecoxib. Reports that pain has been worsening, unfortunately limited efficacy of his pain regimen. Planning for total hip arthroplasty with orthopedics, but will confirm timing and possibly have him seen for closer follow-up.  -- Continue tylenol/celebrex, discussed appropriate dosing  -- Restart voltaren gel/lidocaine patch  -- Plan to contact orthopedics to confirm timing of interventions/surgery    Dysuria - Concern for BPH:  Prior issues with dysuria. Found to have U/A with moderate bacteria/blood. Culture data negative. Empirically treating with course of Levaquin. Pain improved, but lingering abnormal sensation. Scheduled for urology follow-up.  -- Continue Levaquin course  -- Prompted patient to confirm Urology follow-up, scheduled 10/3  ??  HTN - HLD:  BP prior 118/79. BP today elevated 144/93. Did not take anti-hypertensive this AM and in significant pain. His HTN3 last visit did reveal elevated creatinine, which we will need to balance with his NSAID use. Will avoid adjusting regimen at this time.   -- Continue valsartan-hydrochlorothiazide  ??  HIV:  Follows with[facility], well-controlled at this time. Undetectable since 2019. Reporting good adherence and tolerating medication regimen well.  - Continue Biktarvy  - Following with infectious disease??(Dr. Rosemarie Beath)    Return in about 6 months (around 02/27/2022).    Staffed with Dr. Gerhard Munch, discussed    __________________________________________________________    HPI:    Presents today with ongoing pain in his left hip.  Pain was well controlled at prior visit, but now worsening.  Now new trauma or inciting factor.  Has been taking Tylenol/Celebrex without monitoring dosing.  No other interventions.  He did respond well to a steroid injection in the past but states it only lasted 3 to 4 days.    Ongoing abnormal sensation with urination, but no dysuria.  No blood in his urine.  Has been adherent to his antibiotic regimen.  States that he was having difficulty reaching urology for scheduling appointment.  __________________________________________________________    Problem List:  Patient Active Problem List   Diagnosis   ??? Ventral hernia   ??? Genital warts   ??? HTN (hypertension) (RAF-HCC)   ??? HIV disease (CMS-HCC)   ??? Constipation   ??? Arthritis   ??? Tobacco abuse   ??? Acquired hallux rigidus of right foot   ??? PND (paroxysmal nocturnal dyspnea)   ??? Arthritis pain, hip   ??? Alcohol use disorder, severe, dependence (CMS-HCC)   ??? 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 Wheezing   ??? Hyperlipidemia   ??? Allergic rhinitis   ??? GERD (gastroesophageal reflux disease)   ??? ARF (acute renal failure) (CMS-HCC)   ??? Angioedema   ??? BPH (benign prostatic hyperplasia)   ??? Dyslipidemia   ??? MDD (major depressive disorder), recurrent episode, moderate (CMS-HCC)   ??? Substance induced mood disorder (CMS-HCC)   ???  Primary osteoarthritis of right hip   ??? Status post total hip replacement, right   ??? Cocaine-induced mood disorder (CMS-HCC)       Medications:  Reviewed in EPIC  __________________________________________________________    Physical Exam:   Vital Signs:  Vitals:    08/30/21 0825   BP: 144/93   Pulse: 55   Resp: 18   Temp: 36.7 ??C (98 ??F)   TempSrc: Temporal   SpO2: 98%   Weight: (!) 103.4 kg (228 lb)   Height: 175.3 cm (5' 9)          Gen: Well appearing, NAD, using rollator  CV: RRR, no murmurs  Pulm: Breathing comfortably, no respiratory distress, good air movement  Abd: Soft, NTND, normal BS.  Ext: No edema, no tenderness to palpation at L hip, but limited range of movement when attempting ambulation      PHQ-9 Score:     GAD-7 Score:       Medication adherence and barriers to the treatment plan have been addressed. Opportunities to optimize healthy behaviors have been discussed. Patient / caregiver voiced understanding.

## 2021-08-31 MED FILL — VALSARTAN 80 MG-HYDROCHLOROTHIAZIDE 12.5 MG TABLET: ORAL | 90 days supply | Qty: 90 | Fill #0

## 2021-09-15 NOTE — Unmapped (Signed)
Specialty Medication(s): Biktarvy 50-200-25mg     Mr.Purpura has been dis-enrolled from the [facility] specialty pharmacy services due to a pharmacy change. The patient is now filling at [facility].    Additional information provided to the patient: n/a    [name]  [facility] Specialty Pharmacist

## 2021-09-15 NOTE — Unmapped (Signed)
Spoke with patient. He has already switched his specialty pharmacy to [facility] specialty pharmacy, [phone] . I explained to patient that we will disenroll him. Patient will call us if he wishes [place] to send any non-specialty medications to him. Patient aware that we will no longer call him each month. No questions for the pharmacist.

## 2021-09-18 ENCOUNTER — Ambulatory Visit: Admit: 2021-09-18 | Payer: MEDICAID

## 2021-09-26 ENCOUNTER — Ambulatory Visit: Admit: 2021-09-26 | Discharge: 2021-09-27 | Payer: MEDICAID

## 2021-10-04 ENCOUNTER — Ambulatory Visit: Admit: 2021-10-04 | Discharge: 2021-10-05 | Payer: MEDICAID

## 2021-10-04 DIAGNOSIS — Z5941 Food insecurity: Principal | ICD-10-CM

## 2021-10-04 DIAGNOSIS — M109 Gout, unspecified: Principal | ICD-10-CM

## 2021-10-04 MED ORDER — COLCHICINE 0.6 MG TABLET
ORAL_TABLET | Freq: Every day | ORAL | 0 refills | 10 days | Status: CP
Start: 2021-10-04 — End: 2021-10-14
  Filled 2021-10-04: qty 10, 10d supply, fill #0

## 2021-10-07 ENCOUNTER — Ambulatory Visit
Admit: 2021-10-07 | Discharge: 2021-10-07 | Disposition: A | Discharge status: Home / Self Care | Payer: MEDICAID | Attending: Emergency Medicine

## 2021-10-07 ENCOUNTER — Emergency Department
Admit: 2021-10-07 | Discharge: 2021-10-07 | Disposition: A | Discharge status: Home / Self Care | Payer: MEDICAID | Attending: Emergency Medicine

## 2021-10-07 DIAGNOSIS — R001 Bradycardia, unspecified: Principal | ICD-10-CM

## 2021-10-16 ENCOUNTER — Institutional Professional Consult (permissible substitution): Admit: 2021-10-16 | Discharge: 2021-10-17 | Payer: MEDICAID

## 2021-10-16 DIAGNOSIS — R001 Bradycardia, unspecified: Principal | ICD-10-CM

## 2021-11-03 MED ORDER — BUPROPION HCL XL 150 MG 24 HR TABLET, EXTENDED RELEASE
ORAL_TABLET | Freq: Every morning | ORAL | 3 refills | 90 days | Status: CP
Start: 2021-11-03 — End: 2022-02-01

## 2021-11-07 DIAGNOSIS — I10 Essential (primary) hypertension: Principal | ICD-10-CM

## 2021-11-14 ENCOUNTER — Ambulatory Visit
Admit: 2021-11-14 | Discharge: 2021-11-14 | Payer: MEDICAID | Attending: Cardiovascular Disease | Primary: Cardiovascular Disease

## 2021-11-14 ENCOUNTER — Ambulatory Visit: Admit: 2021-11-14 | Discharge: 2021-11-14 | Payer: MEDICAID

## 2021-11-14 DIAGNOSIS — R001 Bradycardia, unspecified: Principal | ICD-10-CM

## 2021-11-14 DIAGNOSIS — Z720 Tobacco use: Principal | ICD-10-CM

## 2021-11-19 ENCOUNTER — Ambulatory Visit
Admit: 2021-11-19 | Discharge: 2021-11-22 | Disposition: A | Discharge status: Home / Self Care | Payer: MEDICAID | Admitting: Internal Medicine

## 2021-11-19 ENCOUNTER — Ambulatory Visit: Admit: 2021-11-19 | Discharge: 2021-11-22 | Payer: MEDICAID

## 2021-11-22 MED ORDER — NICOTINE 7 MG/24 HR DAILY TRANSDERMAL PATCH
MEDICATED_PATCH | Freq: Every day | TRANSDERMAL | 0 refills | 28 days | Status: CP
Start: 2021-11-22 — End: ?
  Filled 2021-11-22: qty 28, 28d supply, fill #0

## 2021-11-22 MED ORDER — NICOTINE (POLACRILEX) 2 MG GUM
BUCCAL | 0 refills | 5 days | Status: CP | PRN
Start: 2021-11-22 — End: 2021-12-22
  Filled 2021-11-22: qty 110, 5d supply, fill #0

## 2021-11-23 DIAGNOSIS — Z09 Encounter for follow-up examination after completed treatment for conditions other than malignant neoplasm: Principal | ICD-10-CM

## 2021-11-29 ENCOUNTER — Ambulatory Visit: Admit: 2021-11-29 | Discharge: 2021-11-30 | Payer: MEDICAID

## 2021-11-29 DIAGNOSIS — Z0000 Encounter for general adult medical examination without abnormal findings: Principal | ICD-10-CM

## 2021-11-29 DIAGNOSIS — I10 Essential (primary) hypertension: Principal | ICD-10-CM

## 2021-11-29 DIAGNOSIS — E785 Hyperlipidemia, unspecified: Principal | ICD-10-CM

## 2021-11-29 DIAGNOSIS — M1611 Unilateral primary osteoarthritis, right hip: Principal | ICD-10-CM

## 2021-11-29 DIAGNOSIS — Z09 Encounter for follow-up examination after completed treatment for conditions other than malignant neoplasm: Principal | ICD-10-CM

## 2021-11-30 DIAGNOSIS — I10 Essential (primary) hypertension: Principal | ICD-10-CM

## 2021-12-05 ENCOUNTER — Ambulatory Visit: Admit: 2021-12-05 | Discharge: 2021-12-07 | Payer: MEDICAID

## 2021-12-07 MED ORDER — VALSARTAN 80 MG-HYDROCHLOROTHIAZIDE 12.5 MG TABLET
ORAL_TABLET | Freq: Every day | ORAL | 0 refills | 30 days | Status: CP
Start: 2021-12-07 — End: 2022-01-06
  Filled 2021-12-07: qty 30, 30d supply, fill #0

## 2021-12-08 DIAGNOSIS — Z09 Encounter for follow-up examination after completed treatment for conditions other than malignant neoplasm: Principal | ICD-10-CM

## 2021-12-11 ENCOUNTER — Ambulatory Visit
Admit: 2021-12-11 | Discharge: 2021-12-11 | Disposition: A | Discharge status: Home / Self Care | Payer: MEDICAID | Attending: Emergency Medicine

## 2021-12-11 ENCOUNTER — Emergency Department
Admit: 2021-12-11 | Discharge: 2021-12-11 | Disposition: A | Discharge status: Home / Self Care | Payer: MEDICAID | Attending: Emergency Medicine

## 2021-12-11 DIAGNOSIS — E860 Dehydration: Principal | ICD-10-CM

## 2021-12-11 DIAGNOSIS — N179 Acute kidney failure, unspecified: Principal | ICD-10-CM

## 2021-12-11 DIAGNOSIS — R55 Syncope and collapse: Principal | ICD-10-CM

## 2021-12-20 ENCOUNTER — Ambulatory Visit: Admit: 2021-12-20 | Discharge: 2021-12-21 | Payer: MEDICAID

## 2021-12-20 DIAGNOSIS — F1490 Cocaine use, unspecified, uncomplicated: Principal | ICD-10-CM

## 2021-12-20 DIAGNOSIS — R079 Chest pain, unspecified: Principal | ICD-10-CM

## 2021-12-20 DIAGNOSIS — I10 Essential (primary) hypertension: Principal | ICD-10-CM

## 2021-12-20 DIAGNOSIS — Z09 Encounter for follow-up examination after completed treatment for conditions other than malignant neoplasm: Principal | ICD-10-CM

## 2021-12-20 MED ORDER — DICLOFENAC 1 % TOPICAL GEL
Freq: Four times a day (QID) | TOPICAL | 0 refills | 13 days | Status: CP
Start: 2021-12-20 — End: 2022-12-20
  Filled 2021-12-22: qty 100, 13d supply, fill #0

## 2021-12-20 MED ORDER — FAMOTIDINE 20 MG TABLET
ORAL_TABLET | Freq: Two times a day (BID) | ORAL | 11 refills | 30 days | Status: CN
Start: 2021-12-20 — End: 2022-12-20

## 2021-12-20 MED ORDER — LIDOCAINE 5 % TOPICAL PATCH
MEDICATED_PATCH | Freq: Two times a day (BID) | TRANSDERMAL | 1 refills | 15.00000 days | Status: CN
Start: 2021-12-20 — End: 2022-01-19

## 2021-12-20 MED ORDER — NICOTINE (POLACRILEX) 2 MG GUM
BUCCAL | 3 refills | 5 days | Status: CP | PRN
Start: 2021-12-20 — End: 2022-01-19
  Filled 2021-12-22: qty 100, 5d supply, fill #0

## 2021-12-22 DIAGNOSIS — I10 Essential (primary) hypertension: Principal | ICD-10-CM

## 2021-12-22 DIAGNOSIS — N179 Acute kidney failure, unspecified: Principal | ICD-10-CM

## 2021-12-23 ENCOUNTER — Institutional Professional Consult (permissible substitution): Admit: 2021-12-22 | Discharge: 2021-12-22 | Disposition: A | Discharge status: Home / Self Care | Payer: MEDICAID

## 2021-12-23 ENCOUNTER — Other Ambulatory Visit: Admit: 2021-12-22 | Discharge: 2021-12-22 | Disposition: A | Discharge status: Home / Self Care | Payer: MEDICAID

## 2021-12-23 ENCOUNTER — Ambulatory Visit
Admit: 2021-12-23 | Discharge: 2021-12-23 | Disposition: A | Discharge status: Home / Self Care | Payer: MEDICAID | Attending: Emergency Medicine

## 2021-12-23 ENCOUNTER — Emergency Department
Admit: 2021-12-23 | Discharge: 2021-12-23 | Disposition: A | Discharge status: Home / Self Care | Payer: MEDICAID | Attending: Emergency Medicine

## 2022-01-02 ENCOUNTER — Ambulatory Visit: Admit: 2022-01-02 | Discharge: 2022-01-03 | Discharge status: Against Medical Advice | Payer: MEDICAID

## 2022-01-12 MED ORDER — CELECOXIB 100 MG CAPSULE
ORAL_CAPSULE | Freq: Two times a day (BID) | ORAL | 1 refills | 45 days
Start: 2022-01-12 — End: ?

## 2022-01-12 MED ORDER — LOSARTAN 100 MG TABLET
ORAL_TABLET | Freq: Every day | ORAL | 11 refills | 30.00000 days
Start: 2022-01-12 — End: ?

## 2022-01-12 MED ORDER — FINASTERIDE 5 MG TABLET
ORAL_TABLET | Freq: Every day | ORAL | 1 refills | 90.00000 days
Start: 2022-01-12 — End: ?

## 2022-01-12 MED ORDER — TAMSULOSIN 0.4 MG CAPSULE
ORAL_CAPSULE | Freq: Every day | ORAL | 1 refills | 90.00000 days
Start: 2022-01-12 — End: ?

## 2022-01-12 MED ORDER — PEG 3350-ELECTROLYTES 236 GRAM-22.74 GRAM-6.74 GRAM-5.86 GRAM SOLUTION
Freq: Once | ORAL | 0 refills | 1.00000 days | Status: CP
Start: 2022-01-12 — End: 2022-01-13
  Filled 2022-01-12: qty 4000, 1d supply, fill #0

## 2022-01-12 MED ORDER — ALBUTEROL SULFATE HFA 90 MCG/ACTUATION AEROSOL INHALER
RESPIRATORY_TRACT | 0 refills | 0 days | PRN
Start: 2022-01-12 — End: ?

## 2022-01-12 MED FILL — TAMSULOSIN 0.4 MG CAPSULE: ORAL | 90 days supply | Qty: 90 | Fill #0

## 2022-01-12 MED FILL — VENTOLIN HFA 90 MCG/ACTUATION AEROSOL INHALER: RESPIRATORY_TRACT | 33 days supply | Qty: 18 | Fill #0

## 2022-01-12 MED FILL — CELECOXIB 100 MG CAPSULE: ORAL | 45 days supply | Qty: 90 | Fill #0

## 2022-01-12 MED FILL — LOSARTAN 100 MG TABLET: ORAL | 30 days supply | Qty: 30 | Fill #0

## 2022-02-13 ENCOUNTER — Ambulatory Visit: Admit: 2022-02-13 | Payer: MEDICAID | Attending: Cardiovascular Disease | Primary: Cardiovascular Disease

## 2022-02-13 ENCOUNTER — Ambulatory Visit: Admit: 2022-02-13 | Payer: MEDICAID

## 2022-02-16 ENCOUNTER — Ambulatory Visit: Admit: 2022-02-16 | Discharge: 2022-02-17 | Disposition: A | Discharge status: Home / Self Care | Payer: MEDICAID

## 2022-02-17 MED ORDER — NITROFURANTOIN MONOHYDRATE/MACROCRYSTALS 100 MG CAPSULE
ORAL_CAPSULE | Freq: Two times a day (BID) | ORAL | 0 refills | 5 days | Status: CP
Start: 2022-02-17 — End: 2022-02-22
  Filled 2022-02-17: qty 10, 5d supply, fill #0

## 2022-02-22 ENCOUNTER — Ambulatory Visit: Admit: 2022-02-22 | Discharge: 2022-02-23 | Disposition: A | Discharge status: Home / Self Care | Payer: MEDICAID

## 2022-02-23 MED ORDER — LOSARTAN 100 MG TABLET
ORAL_TABLET | Freq: Every day | ORAL | 0 refills | 30 days | Status: CP
Start: 2022-02-23 — End: 2022-03-25
  Filled 2022-02-23: qty 30, 30d supply, fill #0

## 2022-02-28 ENCOUNTER — Ambulatory Visit: Admit: 2022-02-28 | Payer: MEDICAID

## 2022-03-04 ENCOUNTER — Ambulatory Visit
Admit: 2022-03-04 | Discharge: 2022-03-05 | Disposition: A | Discharge status: Home / Self Care | Payer: MEDICAID | Attending: Medical

## 2022-03-06 IMAGING — CR DG HIP (WITH OR WITHOUT PELVIS) 2-3V*R*
3 series · 3 of 3 positions shown · non-contrast
Comparison: September 01, 2019.

CLINICAL DATA: Right hip pain after injury yesterday.

EXAM:
DG HIP (WITH OR WITHOUT PELVIS) 2-3V RIGHT

[pelvis ap]
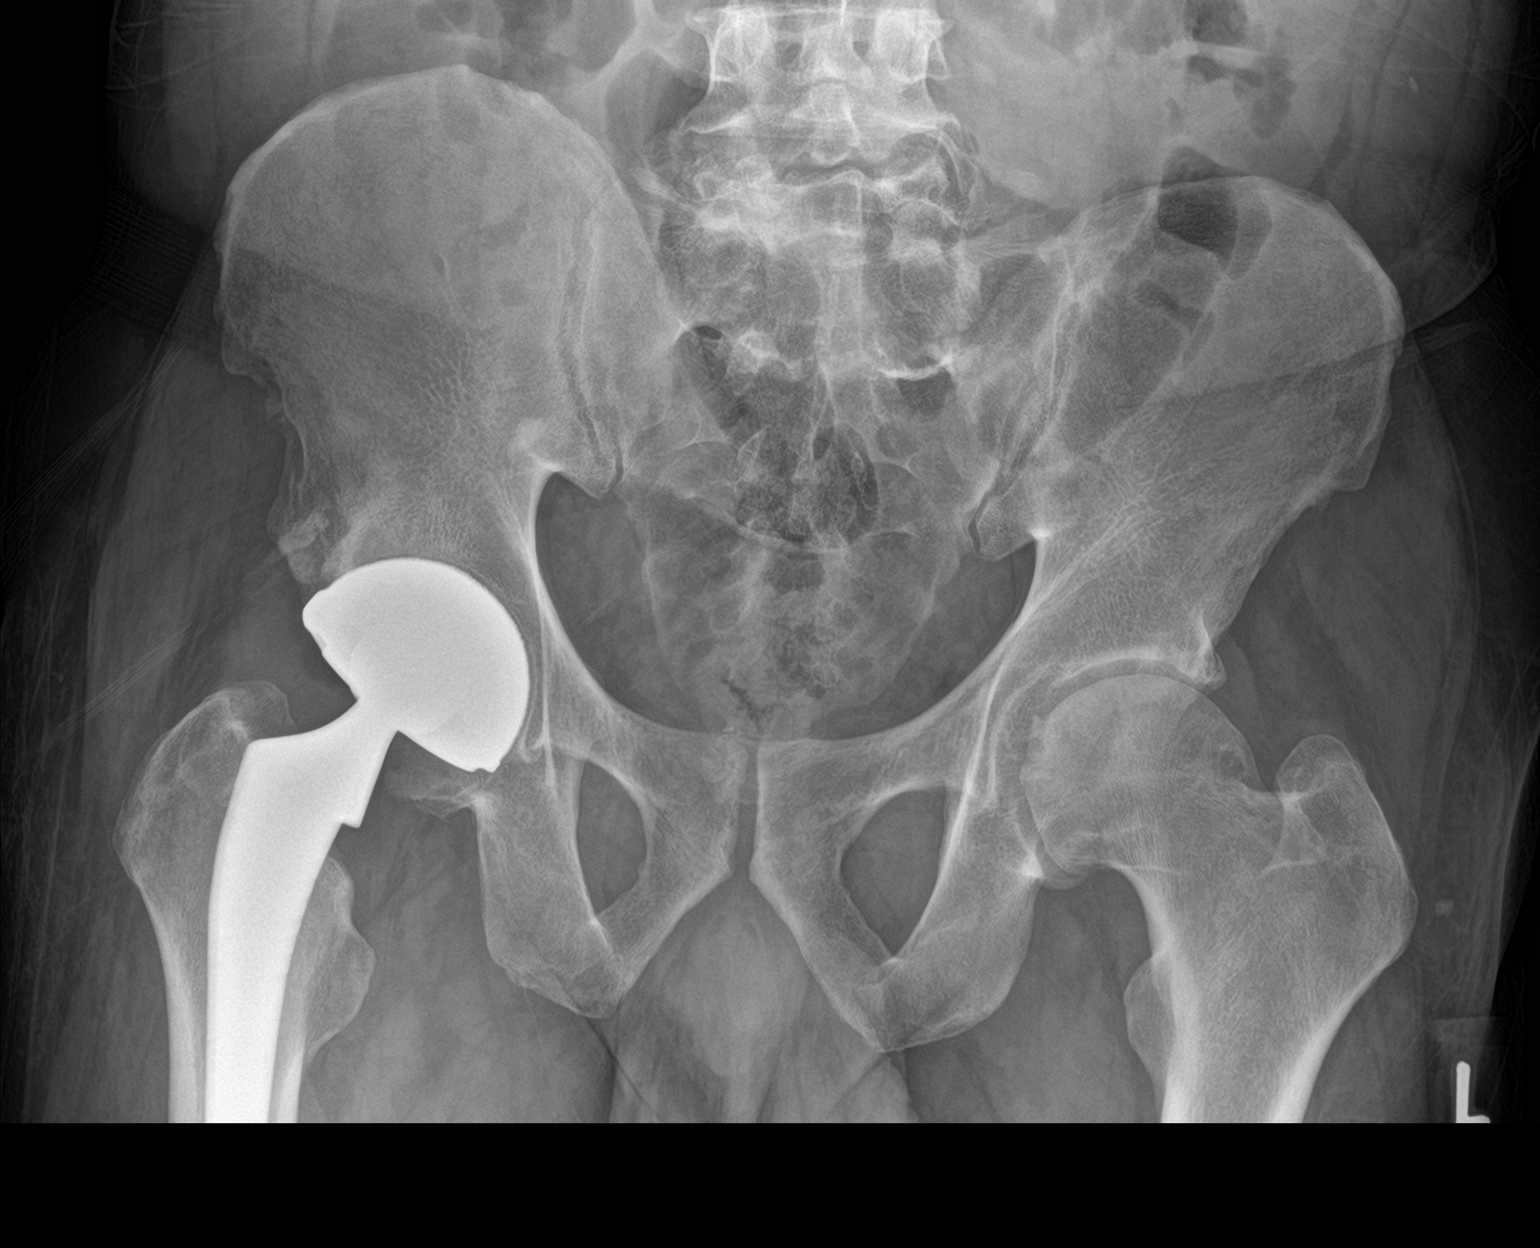

[hip ap]
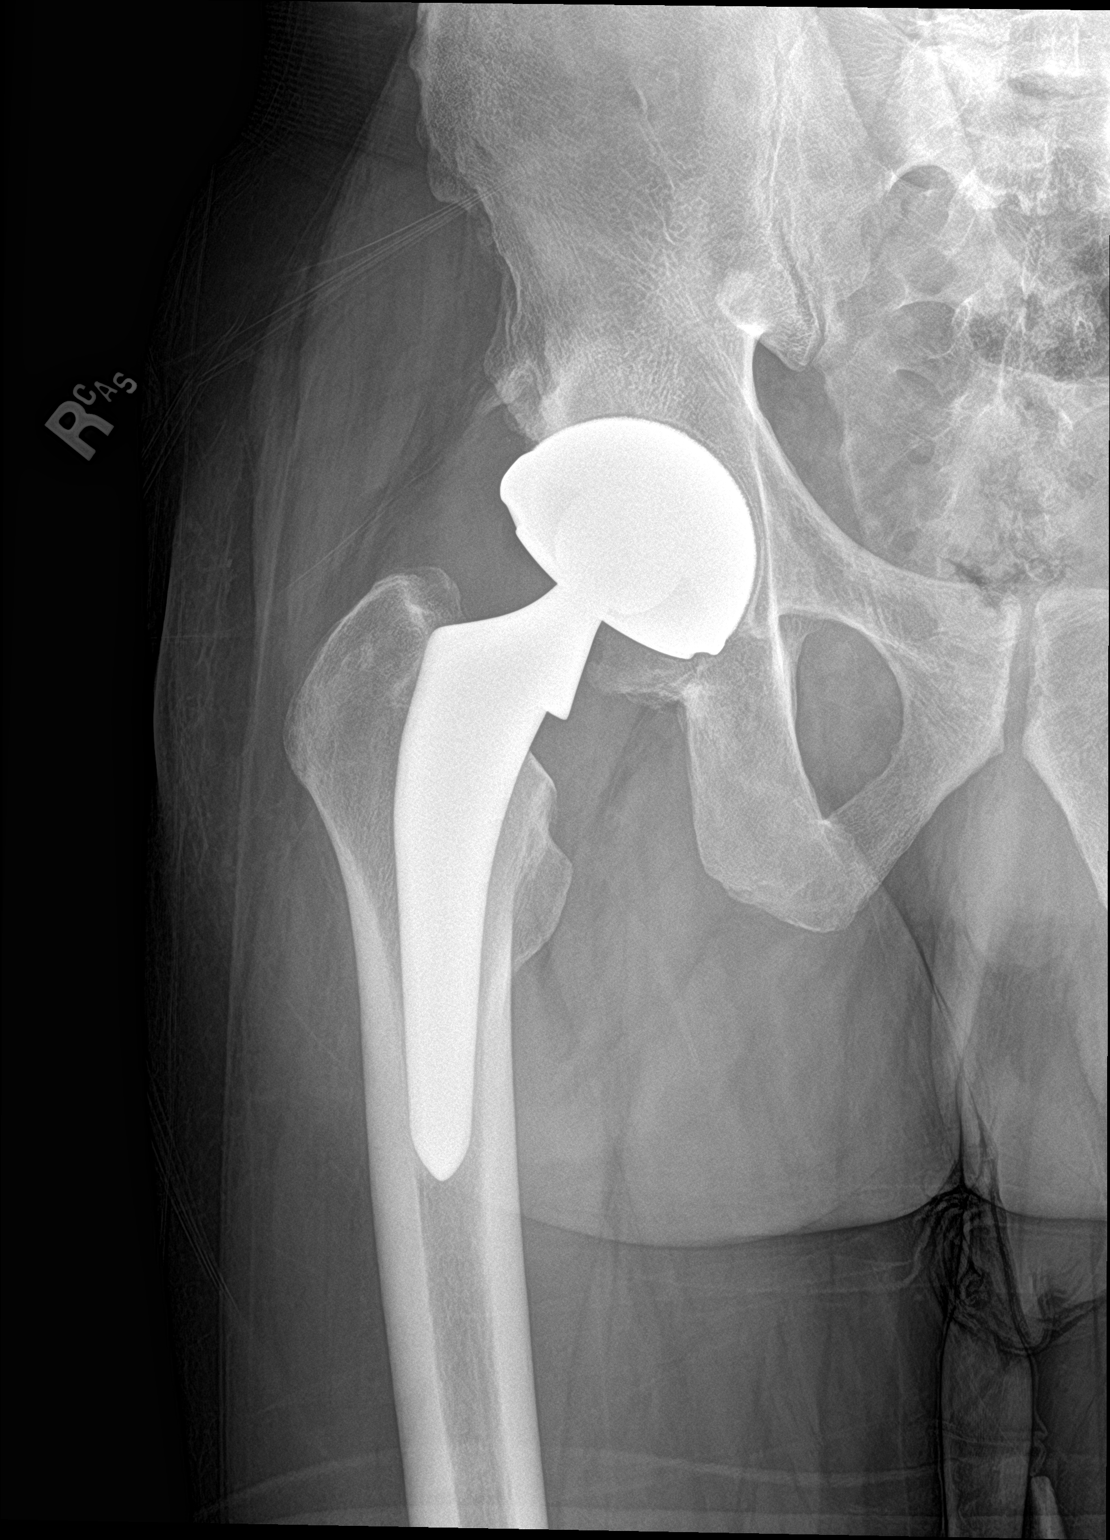

[hip lat]
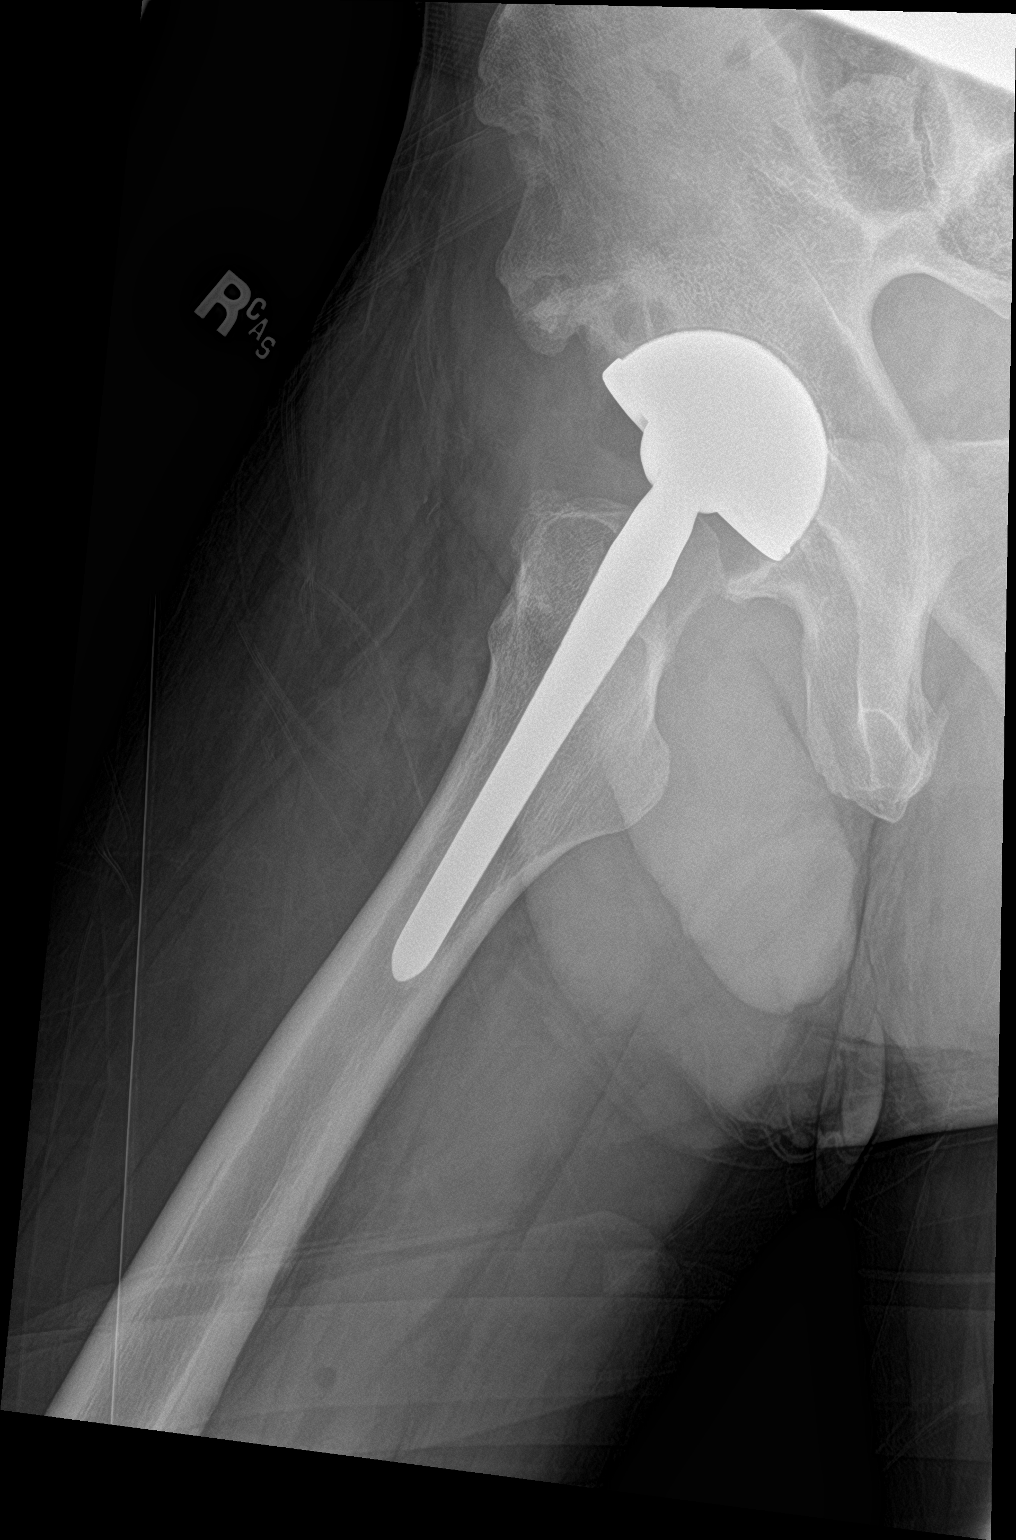

[3 of 3 positions shown; findings below may reference images not displayed]

FINDINGS: Status post right total hip arthroplasty. The femoral and tibial
components appear to be well situated. No fracture or dislocation is
noted.
IMPRESSION: Status post right total hip arthroplasty. No acute abnormality seen.

## 2022-03-09 MED ORDER — LOSARTAN 100 MG TABLET
ORAL_TABLET | Freq: Every day | ORAL | 0 refills | 30 days | Status: CP
Start: 2022-03-09 — End: 2022-04-08

## 2022-03-12 ENCOUNTER — Ambulatory Visit: Admit: 2022-03-12 | Discharge: 2022-03-13 | Payer: MEDICAID

## 2022-03-12 DIAGNOSIS — N400 Enlarged prostate without lower urinary tract symptoms: Principal | ICD-10-CM

## 2022-03-12 DIAGNOSIS — I10 Essential (primary) hypertension: Principal | ICD-10-CM

## 2022-03-12 DIAGNOSIS — G8929 Other chronic pain: Principal | ICD-10-CM

## 2022-03-12 DIAGNOSIS — F331 Major depressive disorder, recurrent, moderate: Principal | ICD-10-CM

## 2022-03-12 DIAGNOSIS — K219 Gastro-esophageal reflux disease without esophagitis: Principal | ICD-10-CM

## 2022-03-12 DIAGNOSIS — M546 Pain in thoracic spine: Principal | ICD-10-CM

## 2022-03-12 DIAGNOSIS — B20 Human immunodeficiency virus [HIV] disease: Principal | ICD-10-CM

## 2022-03-12 DIAGNOSIS — E785 Hyperlipidemia, unspecified: Principal | ICD-10-CM

## 2022-03-12 DIAGNOSIS — R0789 Other chest pain: Principal | ICD-10-CM

## 2022-03-12 MED ORDER — LIDOCAINE 5 % TOPICAL PATCH
MEDICATED_PATCH | Freq: Two times a day (BID) | TRANSDERMAL | 0 refills | 15 days | Status: CP
Start: 2022-03-12 — End: 2022-03-27
  Filled 2022-03-12: qty 30, 15d supply, fill #0

## 2022-03-12 MED ORDER — TAMSULOSIN 0.4 MG CAPSULE
ORAL_CAPSULE | Freq: Every day | ORAL | 3 refills | 90 days | Status: CP
Start: 2022-03-12 — End: 2023-03-12

## 2022-03-12 MED ORDER — PANTOPRAZOLE 40 MG TABLET,DELAYED RELEASE
ORAL_TABLET | Freq: Every day | ORAL | 3 refills | 90 days | Status: CP | PRN
Start: 2022-03-12 — End: 2023-03-12

## 2022-03-12 MED ORDER — ROSUVASTATIN 20 MG TABLET
ORAL_TABLET | Freq: Every evening | ORAL | 3 refills | 90 days | Status: CP
Start: 2022-03-12 — End: 2023-03-12
  Filled 2022-03-12: qty 90, 90d supply, fill #0

## 2022-03-12 MED ORDER — LOSARTAN 100 MG TABLET
ORAL_TABLET | Freq: Every day | ORAL | 3 refills | 90 days | Status: CP
Start: 2022-03-12 — End: 2023-03-12

## 2022-03-12 MED ORDER — FINASTERIDE 5 MG TABLET
ORAL_TABLET | Freq: Every day | ORAL | 3 refills | 90 days | Status: CP
Start: 2022-03-12 — End: 2023-03-12
  Filled 2022-03-12: qty 90, 90d supply, fill #0

## 2022-03-19 ENCOUNTER — Emergency Department: Payer: Medicaid Other

## 2022-03-19 ENCOUNTER — Other Ambulatory Visit: Payer: Self-pay

## 2022-03-19 ENCOUNTER — Emergency Department
Admission: EM | Admit: 2022-03-19 | Discharge: 2022-03-20 | Disposition: A | Discharge status: Home / Self Care | Payer: Medicaid Other | Attending: Emergency Medicine | Admitting: Emergency Medicine

## 2022-03-19 DIAGNOSIS — F14220 Cocaine dependence with intoxication, uncomplicated: Secondary | ICD-10-CM | POA: Diagnosis not present

## 2022-03-19 DIAGNOSIS — R778 Other specified abnormalities of plasma proteins: Secondary | ICD-10-CM | POA: Insufficient documentation

## 2022-03-19 DIAGNOSIS — R079 Chest pain, unspecified: Secondary | ICD-10-CM

## 2022-03-19 DIAGNOSIS — F1420 Cocaine dependence, uncomplicated: Secondary | ICD-10-CM | POA: Diagnosis not present

## 2022-03-19 DIAGNOSIS — R45851 Suicidal ideations: Secondary | ICD-10-CM

## 2022-03-19 DIAGNOSIS — Z20822 Contact with and (suspected) exposure to covid-19: Secondary | ICD-10-CM | POA: Diagnosis not present

## 2022-03-19 DIAGNOSIS — F1494 Cocaine use, unspecified with cocaine-induced mood disorder: Secondary | ICD-10-CM | POA: Diagnosis present

## 2022-03-19 DIAGNOSIS — Z765 Malingerer [conscious simulation]: Secondary | ICD-10-CM

## 2022-03-19 DIAGNOSIS — F1414 Cocaine abuse with cocaine-induced mood disorder: Secondary | ICD-10-CM | POA: Diagnosis present

## 2022-03-19 DIAGNOSIS — I10 Essential (primary) hypertension: Secondary | ICD-10-CM | POA: Diagnosis present

## 2022-03-19 DIAGNOSIS — F1994 Other psychoactive substance use, unspecified with psychoactive substance-induced mood disorder: Secondary | ICD-10-CM | POA: Diagnosis present

## 2022-03-19 DIAGNOSIS — F1020 Alcohol dependence, uncomplicated: Secondary | ICD-10-CM | POA: Diagnosis present

## 2022-03-19 DIAGNOSIS — B20 Human immunodeficiency virus [HIV] disease: Secondary | ICD-10-CM | POA: Diagnosis present

## 2022-03-19 LAB — URINE DRUG SCREEN, QUALITATIVE (ARMC ONLY)
Amphetamines, Ur Screen: NOT DETECTED
Barbiturates, Ur Screen: NOT DETECTED
Benzodiazepine, Ur Scrn: NOT DETECTED
Cannabinoid 50 Ng, Ur ~~LOC~~: NOT DETECTED
Cocaine Metabolite,Ur ~~LOC~~: POSITIVE — AB
MDMA (Ecstasy)Ur Screen: NOT DETECTED
Methadone Scn, Ur: NOT DETECTED
Opiate, Ur Screen: NOT DETECTED
Phencyclidine (PCP) Ur S: NOT DETECTED
Tricyclic, Ur Screen: NOT DETECTED

## 2022-03-19 LAB — CBC
HCT: 43.3 % (ref 39.0–52.0)
Hemoglobin: 14.7 g/dL (ref 13.0–17.0)
MCH: 31.9 pg (ref 26.0–34.0)
MCHC: 33.9 g/dL (ref 30.0–36.0)
MCV: 93.9 fL (ref 80.0–100.0)
Platelets: 221 10*3/uL (ref 150–400)
RBC: 4.61 MIL/uL (ref 4.22–5.81)
RDW: 12.3 % (ref 11.5–15.5)
WBC: 6.1 10*3/uL (ref 4.0–10.5)
nRBC: 0 % (ref 0.0–0.2)

## 2022-03-19 LAB — COMPREHENSIVE METABOLIC PANEL
ALT: 30 U/L (ref 0–44)
AST: 41 U/L (ref 15–41)
Albumin: 4.5 g/dL (ref 3.5–5.0)
Alkaline Phosphatase: 70 U/L (ref 38–126)
Anion gap: 13 (ref 5–15)
BUN: 36 mg/dL — ABNORMAL HIGH (ref 6–20)
CO2: 20 mmol/L — ABNORMAL LOW (ref 22–32)
Calcium: 9.4 mg/dL (ref 8.9–10.3)
Chloride: 107 mmol/L (ref 98–111)
Creatinine, Ser: 1.81 mg/dL — ABNORMAL HIGH (ref 0.61–1.24)
GFR, Estimated: 43 mL/min — ABNORMAL LOW (ref >60)
Glucose, Bld: 80 mg/dL (ref 70–99)
Potassium: 3.4 mmol/L — ABNORMAL LOW (ref 3.5–5.1)
Sodium: 140 mmol/L (ref 135–145)
Total Bilirubin: 1.4 mg/dL — ABNORMAL HIGH (ref 0.3–1.2)
Total Protein: 8.4 g/dL — ABNORMAL HIGH (ref 6.5–8.1)

## 2022-03-19 LAB — RESP PANEL BY RT-PCR (FLU A&B, COVID) ARPGX2
Influenza A by PCR: NEGATIVE
Influenza B by PCR: NEGATIVE
SARS Coronavirus 2 by RT PCR: NEGATIVE

## 2022-03-19 LAB — TROPONIN I (HIGH SENSITIVITY)
Troponin I (High Sensitivity): 25 ng/L — ABNORMAL HIGH (ref <18)
Troponin I (High Sensitivity): 18 ng/L — ABNORMAL HIGH (ref <18)

## 2022-03-19 LAB — ACETAMINOPHEN LEVEL: Acetaminophen (Tylenol), Serum: <10 ug/mL — ABNORMAL LOW (ref 10–30)

## 2022-03-19 LAB — ETHANOL: Alcohol, Ethyl (B): <10 mg/dL (ref <10)

## 2022-03-19 LAB — SALICYLATE LEVEL: Salicylate Lvl: <7 mg/dL — ABNORMAL LOW (ref 7.0–30.0)

## 2022-03-19 MED ORDER — SODIUM CHLORIDE 0.9 % IV BOLUS
1000.0000 mL | Freq: Once | INTRAVENOUS | Status: AC
Start: 2022-03-19 — End: 2022-03-19
  Administered 2022-03-19: 1000 mL via INTRAVENOUS

## 2022-03-19 NOTE — ED Notes (Signed)
Pt dressed out into hospital attire. [name] belongings to include: ?1 black shirt ?1 black phone ?2 white shoes ?1 pair jeans ?2 white socks ?1 black bookbag ?1 black hat ?1 black belt ?1 red briefs ? ?

## 2022-03-19 NOTE — ED Triage Notes (Addendum)
Pt comes via EMs from home with c/o CP that started last night. Pt states he used crack the last three days. Pt states SI thoughts. ? ?Pt states plan of overdosing on pills. ? ?Pt states he got good news and then just partyed a bit too much with some friends. Pt states prior 13 months sober. Pt denies any alcohol use.  ?

## 2022-03-19 NOTE — Discharge Instructions (Addendum)
You have evidence of strain of your heart but your heart markers were going down so do not feel like it was an active heart attack.  However you probably could have coronary disease due to your crack use.  Therefore please call the cardiology number to get follow-up and avoid crack or cocaine given that the use of these can cause heart attack and death ?In the meantime we will have you go to [facility] to work on your crack problem. ?

## 2022-03-19 NOTE — ED Provider Notes (Signed)
? ?[facility] ?Provider Note ? ? ? None  ?  (approximate) ? ? ?History  ? ?Chest Pain and SI ? ? ?HPI ? ?Roy Mceachern Worth Sr. is a 56 y.o. male who comes in with concerns for chest pain that started last night.  He reports using crack 3 days ago.  He also reports having some SI.  Patient reports a plan of overdosing on pillsPatient reports that he did take the pills of his blood pressure and held them in his hand but he did not actually take any of them.  Patient reports that he was sitting at the [facility] but then just got kicked out.  He reports the chest pain has since all resolved.  He denies any significant shortness of breath.  Reports a little bit of abdominal cramping.   ? ?Physical Exam  ? ?Triage Vital Signs: ?ED Triage Vitals  ?Enc Vitals Group  ?   BP 03/19/22 1404 (!) 111/98  ?   Pulse Rate 03/19/22 1404 85  ?   Resp 03/19/22 1404 19  ?   Temp 03/19/22 1404 98 ?F (36.7 ?C)  ?   Temp src --   ?   SpO2 03/19/22 1404 100 %  ?   Weight --   ?   Height --   ?   Head Circumference --   ?   Peak Flow --   ?   Pain Score 03/19/22 1401 10  ?   Pain Loc --   ?   Pain Edu? --   ?   Excl. in GC? --   ? ? ?Most recent vital signs: ?Vitals:  ? 03/19/22 1404  ?BP: (!) 111/98  ?Pulse: 85  ?Resp: 19  ?Temp: 98 ?F (36.7 ?C)  ?SpO2: 100%  ? ? ? ?General: Awake, no distress.  Patient is initially asleep but able to be woken up ?CV:  Good peripheral perfusion.  ?Resp:  Normal effort.  ?Abd:  No distention.  Soft and nontender ?Other:  Patient reports not wanting to use drugs anymore and reports some SI ? ? ?ED Results / Procedures / Treatments  ? ?Labs ?(all labs ordered are listed, but only abnormal results are displayed) ?Labs Reviewed  ?COMPREHENSIVE METABOLIC PANEL - Abnormal; Notable for the following components:  ?    Result Value  ? Potassium 3.4 (*)   ? CO2 20 (*)   ? BUN 36 (*)   ? Creatinine, Ser 1.81 (*)   ? Total Protein 8.4 (*)   ? Total Bilirubin 1.4 (*)   ? GFR, Estimated 43 (*)    ? All other components within normal limits  ?SALICYLATE LEVEL - Abnormal; Notable for the following components:  ? Salicylate Lvl <7.0 (*)   ? All other components within normal limits  ?ACETAMINOPHEN LEVEL - Abnormal; Notable for the following components:  ? Acetaminophen (Tylenol), Serum <10 (*)   ? All other components within normal limits  ?URINE DRUG SCREEN, QUALITATIVE ([facility] ONLY) - Abnormal; Notable for the following components:  ? Cocaine Metabolite,Ur  POSITIVE (*)   ? All other components within normal limits  ?TROPONIN I (HIGH SENSITIVITY) - Abnormal; Notable for the following components:  ? Troponin I (High Sensitivity) 25 (*)   ? All other components within normal limits  ?CBC  ?ETHANOL  ?TROPONIN I (HIGH SENSITIVITY)  ? ? ? ?EKG ? ?My interpretation of EKG: ? ?Normal sinus rate of 90 without any ST elevation but does have some  T wave inversions in lead III, V5 and V6, normal intervals ? ?Reviewed prior EKG and it looks like he has had some T wave inversions in V4 V5 and may be lead III in the past ? ?RADIOLOGY ?I have reviewed the xray personally with evidence of anemia.  Some thickening may be just be COPD ? ?PROCEDURES: ? ?Critical Care performed: No ? ?Procedures ? ? ?MEDICATIONS ORDERED IN ED: ?Medications  ?sodium chloride 0.9 % bolus 1,000 mL (0 mLs Intravenous Stopped 03/19/22 1735)  ? ? ? ?IMPRESSION / MDM / ASSESSMENT AND PLAN / ED COURSE  ?I reviewed the triage vital signs and the nursing notes. ?             ?               ? ?Differential diagnosis includes, but is not limited to, patient comes in with chest pain in the setting of cocaine use a few days ago.  No active pain at this time.  EKG, cardiac markers, chest x-ray ordered to further evaluate.  No swelling in his legs doubt PE given no shortness of breath and symptoms have now resolved.  Abdomen is nontender suspicion for acute abdominal pathology. ? ? ?CBC normal.  Initial troponin slightly elevated at 25.  CMP shows a bump in  creatinine of 1.81 we will give some IV fluids, alcohol negative, salicylate, Tylenol negative ? ? ?Troponin is downtrending from 25-18.  Suspect that this is most likely demand in the setting of his cocaine and crack use.  Discussed with patient that this is most likely from the crack use but given that he is asymptomatic now I discussed with him that he probably could have coronary disease due to his history of cocaine and crack use and that using these put him at high risk for mortality and death and that he needs to follow-up outpatient with cardiology.  He expressed understanding.  However at this time do not see any indication for admission given troponins are downtrending and chest pain has since resolved.  He expressed understanding and will follow-up outpatient. ? ?Patient is reporting SI so patient is now medically cleared to be dispositioned by psych ? ?D/d includes but is not limited to psychiatric disease, behavioral/personality disorder, inadequate socioeconomic support, medical. ? ?Based on HPI, exam, unremarkable labs, no concern for acute medical problem at this time. No rigidity, clonus, hyperthermia, focal neurologic deficit, diaphoresis, tachycardia, meningismus, ataxia, gait abnormality or other finding to suggest this visit represents a non-psychiatric problem. Screening labs reviewed.   ? ?Given this, pt medically cleared, to be dispositioned per Psych. ? ? ?The patient has been placed in psychiatric observation due to the need to provide a safe environment for the patient while obtaining psychiatric consultation and evaluation, as well as ongoing medical and medication management to treat the patient's condition.  The patient has not been placed under full IVC at this time.  ? ? ?FINAL CLINICAL IMPRESSION(S) / ED DIAGNOSES  ? ?Final diagnoses:  ?Chest pain, unspecified type  ?Suicide ideation  ? ? ? ?Rx / DC Orders  ? ?ED Discharge Orders   ? ? None  ? ?  ? ? ? ?Note:  This document was  prepared using Dragon voice recognition software and may include unintentional dictation errors. ?  ?Concha Se, MD ?03/19/22 1913 ? ?

## 2022-03-19 NOTE — ED Triage Notes (Signed)
FIRST NURSE NOTE:  ?Pt via EMS from home. Pt c/o chest pain that started today. Pt smoked crack today and the past 3 days after years of sobriety. EMS gave 324 ASA and gave 368mL of fluid. Per EMS, pt was SOB and diaphoretic on arrival.  ? ?126/84 ?98 HR ?97% on RA  ?18 RR ?

## 2022-03-19 NOTE — ED Notes (Signed)
Pt given dinner tray.

## 2022-03-20 DIAGNOSIS — F1420 Cocaine dependence, uncomplicated: Secondary | ICD-10-CM

## 2022-03-20 DIAGNOSIS — F14220 Cocaine dependence with intoxication, uncomplicated: Secondary | ICD-10-CM | POA: Diagnosis not present

## 2022-03-20 NOTE — TOC Progression Note (Signed)
Transition of Care (TOC) - Progression Note  ? ? ?Patient Details  ?Name: Roosvelt Churchwell [facility] Sr. ?MRN: [id number] ?Date of Birth: Sep 13, 1966 ? ?Transition of Care (TOC) CM/SW Contact  ?Allayne Butcher, RN ?Phone Number: ?03/20/2022, 11:28 AM ? ?Clinical Narrative:    ?Safe Transport arranged to get patient to [facility] in [place].  ? ? ?Expected Discharge Plan: Homeless Shelter ?Barriers to Discharge: Barriers Resolved ? ?Expected Discharge Plan and Services ?Expected Discharge Plan: Homeless Shelter ?  ?Discharge Planning Services: CM Consult ?  ?Living arrangements for the past 2 months:  ([facility] ?                ?DME Arranged: N/A ?DME Agency: NA ?  ?  ?  ?HH Arranged: NA ?HH Agency: NA ?  ?  ?  ? ? ?Social Determinants of Health (SDOH) Interventions ?  ? ?Readmission Risk Interventions ?   ? View : No data to display.  ?  ?  ?  ? ? ?

## 2022-03-20 NOTE — ED Notes (Signed)
Hospital meal provided.  100% consumed, pt tolerated w/o complaints.  Waste discarded appropriately.   

## 2022-03-20 NOTE — ED Notes (Signed)
Pt given gingerale, per request.  

## 2022-03-20 NOTE — TOC Initial Note (Signed)
Transition of Care (TOC) - Initial/Assessment Note  ? ? ?Patient Details  ?Name: Roy Raczka [facility] Sr. ?MRN: [id number] ?Date of Birth: [birth year] ? ?Transition of Care (TOC) CM/SW Contact:    ?Allayne Butcher, RN ?Phone Number: ?03/20/2022, 10:11 AM ? ?Clinical Narrative:                 ?Patient came into the emergency room with suicidal ideation.  Patient was kicked out of [address] for using crack.  Patient is cleared by psychiatry.  Patient requests to go to [facility] in [place] he just needs a ride.  RNCM will reach out to [facility] to get patient to [facility].   ? ?Expected Discharge Plan: Homeless Shelter ?Barriers to Discharge: Barriers Resolved ? ? ?Patient Goals and CMS Choice ?Patient states their goals for this hospitalization and ongoing recovery are:: Patient wants to go to [facility] in [place] ?  ?  ? ?Expected Discharge Plan and Services ?Expected Discharge Plan: Homeless Shelter ?  ?Discharge Planning Services: CM Consult ?  ?Living arrangements for the past 2 months:  ([facility]) ?                ?DME Arranged: N/A ?DME Agency: NA ?  ?  ?  ?HH Arranged: NA ?HH Agency: NA ?  ?  ?  ? ?Prior Living Arrangements/Services ?Living arrangements for the past 2 months:  ([facility]) ?  ?Patient language and need for interpreter reviewed:: Yes ?Do you feel safe going back to the place where you live?: Yes      ?Need for Family Participation in Patient Care: Yes (Comment) ?Care giver support system in place?: No (comment) ?  ?Criminal Activity/Legal Involvement Pertinent to Current Situation/Hospitalization: No - Comment as needed ? ?Activities of Daily Living ?  ?  ? ?Permission Sought/Granted ?  ?  ?   ?   ?   ?   ? ?Emotional Assessment ?Appearance:: Appears stated age ?Attitude/Demeanor/Rapport: Engaged ?Affect (typically observed): Accepting ?Orientation: : Oriented to Self, Oriented to Place, Oriented to  Time, Oriented to  Situation ?Alcohol / Substance Use: Illicit Drugs ?Psych Involvement: Yes (comment) ? ?Admission diagnosis:  cp, ems ?Patient Active Problem List  ? Diagnosis Date Noted  ? 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 Allergic rhinitis 06/21/2020  ? GERD (gastroesophageal reflux disease) 06/21/2020  ? 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  ? Arthritis pain, hip 05/18/2020  ? Status post total hip replacement, 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  ? Angioedema 07/24/2019  ? Chest pain 05/27/2018  ? ARF (acute renal failure) (HCC) 04/08/2018  ? Malingering 03/07/2017  ? Cocaine dependence (HCC) 02/19/2017  ? Substance induced mood disorder (HCC) 02/19/2017  ? Alcohol use disorder, severe, dependence (HCC) 12/27/2016  ? HIV disease (HCC) 10/26/2016  ? HTN (hypertension) 10/26/2016  ? Dyslipidemia 10/26/2016  ? BPH (benign prostatic hyperplasia) 10/26/2016  ? Constipation 10/26/2016  ? Acquired hallux rigidus of right foot 07/16/2016  ? Arthritis 10/25/2014  ? Genital warts 08/25/2013  ? ?PCP:  System, Provider Not In ?Pharmacy:   ?[facility] - [place], [place] - [address] ?[address] ?[place] [address] ?Phone: [phone] Fax: [phone] ? ?[id number] - [place], [place] - [address] ?[address] ?[place] [address] ?Phone: [phone] Fax: [phone] ? ? ? ? ?Social Determinants of Health (SDOH) Interventions ?  ? ?Readmission Risk Interventions ?   ? View : No  data to display.  ?  ?  ?  ? ? ? ?

## 2022-03-20 NOTE — ED Notes (Signed)
VOL/pending overnight obs and reassessment in the AM 

## 2022-03-20 NOTE — ED Notes (Signed)
Left with [facility]; pt did not wait to have repeat VS. Lunch eaten prior to discharge. ?

## 2022-03-20 NOTE — Consult Note (Addendum)
[facility] Face-to-Face Psychiatry Consult  ? ?Reason for Consult:  re-assessment ?Referring Physician:  EDP ?Patient Identification: Roy Levit Bergren Sr. ?MRN:  [id number] ?Principal Diagnosis: Cocaine dependence (HCC) ?Diagnosis:  Principal Problem: ?  Cocaine dependence (HCC) ?Active Problems: ?  HIV disease (HCC) ?  HTN (hypertension) ?  Substance induced mood disorder (HCC) ?  Malingering ?  Alcohol use disorder, severe, dependence (HCC) ?  Cocaine-induced mood disorder (HCC) ? ? ?Total Time spent with patient: 20 minutes ? ?Subjective:  " I am not suicidal, I just said that because I was using crack. ?Roy Bochicchio Eisenberger Sr. is a 56 y.o. male patient admitted with expression of SI in the context of crack cocaine use. ? ?HPI: Patient was seen this morning as a reassessment from last evening.  Patient states that he is not suicidal.  He states that he says things he does not mean when he is high on crack.  Patient denies any homicidal ideations as well.  Denies auditory or visual hallucinations or paranoia.  Patient does not wish to be hospitalized psychiatrically.  [facility] to begin drug rehabilitation.  He is hoping to get back into the [facility].  He is also in the process of getting a payee for his money and getting into a group home.  He is working with his [facility] in this regard.  He appears to have insight into his need to stop using illicit substances.  Plan is to discharge him at this time. ? ?Past Psychiatric History: See previous ? ?Risk to Self:   ?Risk to Others:   ?Prior Inpatient Therapy:   ?Prior Outpatient Therapy:   ? ?Past Medical History:  ?Past Medical History:  ?Diagnosis Date  ? AIDS (acquired immune deficiency syndrome) (HCC)   ? Arthritis   ? Asthma   ? Bipolar disorder (HCC)   ? Bronchitis   ? Complication of anesthesia   ? Coronary artery disease   ? Depression   ? Dysrhythmia   ? 1st degree heart block/ brady  ? GERD  (gastroesophageal reflux disease)   ? Hepatitis C   ? treated  ? HIV (human immunodeficiency virus infection) (HCC)   ? HTN (hypertension)   ? PONV (postoperative nausea and vomiting)   ?  ?Past Surgical History:  ?Procedure Laterality Date  ? APPLICATION OF WOUND VAC Right 09/01/2019  ? Procedure: APPLICATION OF WOUND VAC;  Surgeon: Kennedy Bucker, MD;  Location: [facility] ORS;  Service: Orthopedics;  Laterality: Right;  Serial # [id number]  ? HERNIA REPAIR Left   ? inguinal  ? TOE SURGERY    ? TOE SURGERY Right   ? TOTAL HIP ARTHROPLASTY Right 09/01/2019  ? Procedure: TOTAL HIP ARTHROPLASTY ANTERIOR APPROACH;  Surgeon: Kennedy Bucker, MD;  Location: [facility] ORS;  Service: Orthopedics;  Laterality: Right;  ? ?Family History:  ?Family History  ?Problem Relation Age of Onset  ? Cancer Brother   ? Uterine cancer Mother   ? CAD Mother   ? Hypertension Mother   ? Hyperlipidemia Mother   ? ?Family Psychiatric  History: See previous ?Social History:  ?Social History  ? ?Substance and Sexual Activity  ?Alcohol Use Yes  ? Alcohol/week: 84.0 standard drinks  ? Types: 84 Cans of beer per week  ? Comment: daily  ?   ?Social History  ? ?Substance and Sexual Activity  ?Drug Use Yes  ? Types: Cocaine, "Crack" cocaine  ? Comment: 01/23/2021  ?  ?  Social History  ? ?Socioeconomic History  ? Marital status: Divorced  ?  Spouse name: Not on file  ? Number of children: Not on file  ? Years of education: Not on file  ? Highest education level: Not on file  ?Occupational History  ? Not on file  ?Tobacco Use  ? Smoking status: Every Day  ?  Packs/day: 0.25  ?  Types: Cigarettes  ? Smokeless tobacco: Never  ?Vaping Use  ? Vaping Use: Never used  ?Substance and Sexual Activity  ? Alcohol use: Yes  ?  Alcohol/week: 84.0 standard drinks  ?  Types: 84 Cans of beer per week  ?  Comment: daily  ? Drug use: Yes  ?  Types: Cocaine, "Crack" cocaine  ?  Comment: 01/23/2021  ? Sexual activity: Yes  ?Other Topics Concern  ? Not on file  ?Social History Narrative   ? Not on file  ? ?Social Determinants of Health  ? ?Financial Resource Strain: Not on file  ?Food Insecurity: Not on file  ?Transportation Needs: Not on file  ?Physical Activity: Not on file  ?Stress: Not on file  ?Social Connections: Not on file  ? ?Additional Social History: ?  ? ?Allergies:   ?Allergies  ?Allergen Reactions  ? Amlodipine Swelling  ?  Of the tongue  ? Lisinopril Swelling  ? Lactose Other (See Comments)  ?  GI distress  ? Pollen Extract Other (See Comments)  ?  Itchy eyes and runny nose  ? ? ?Labs:  ?Results for orders placed or performed during the hospital encounter of 03/19/22 (from the past 48 hour(s))  ?CBC     Status: None  ? Collection Time: 03/19/22  2:05 PM  ?Result Value Ref Range  ? WBC 6.1 4.0 - 10.5 K/uL  ? RBC 4.61 4.22 - 5.81 MIL/uL  ? Hemoglobin 14.7 13.0 - 17.0 g/dL  ? HCT 43.3 39.0 - 52.0 %  ? MCV 93.9 80.0 - 100.0 fL  ? MCH 31.9 26.0 - 34.0 pg  ? MCHC 33.9 30.0 - 36.0 g/dL  ? RDW 12.3 11.5 - 15.5 %  ? Platelets 221 150 - 400 K/uL  ? nRBC 0.0 0.0 - 0.2 %  ?  Comment: Performed at [facility], [address], [place], [place] [address]  ?Troponin I (High Sensitivity)     Status: Abnormal  ? Collection Time: 03/19/22  2:05 PM  ?Result Value Ref Range  ? Troponin I (High Sensitivity) 25 (H) <18 ng/L  ?  Comment: (NOTE) ?Elevated high sensitivity troponin I (hsTnI) values and significant  ?changes across serial measurements may suggest ACS but many other  ?chronic and acute conditions are known to elevate hsTnI results.  ?Refer to the "Links" section for chest pain algorithms and additional  ?guidance. ?Performed at [facility], [address] [place], ?[place] [address] ?  ?Comprehensive metabolic panel     Status: Abnormal  ? Collection Time: 03/19/22  2:05 PM  ?Result Value Ref Range  ? Sodium 140 135 - 145 mmol/L  ? Potassium 3.4 (L) 3.5 - 5.1 mmol/L  ? Chloride 107 98 - 111 mmol/L  ? CO2 20 (L) 22 - 32 mmol/L  ? Glucose, Bld 80 70 - 99 mg/dL  ?  Comment:  Glucose reference range applies only to samples taken after fasting for at least 8 hours.  ? BUN 36 (H) 6 - 20 mg/dL  ? Creatinine, Ser 1.81 (H) 0.61 - 1.24 mg/dL  ? Calcium 9.4  8.9 - 10.3 mg/dL  ? Total Protein 8.4 (H) 6.5 - 8.1 g/dL  ? Albumin 4.5 3.5 - 5.0 g/dL  ? AST 41 15 - 41 U/L  ? ALT 30 0 - 44 U/L  ? Alkaline Phosphatase 70 38 - 126 U/L  ? Total Bilirubin 1.4 (H) 0.3 - 1.2 mg/dL  ? GFR, Estimated 43 (L) >60 mL/min  ?  Comment: (NOTE) ?Calculated using the CKD-EPI Creatinine Equation (2021) ?  ? Anion gap 13 5 - 15  ?  Comment: Performed at [facility], [address] [place], [place] [address]  ?Ethanol     Status: None  ? Collection Time: 03/19/22  2:05 PM  ?Result Value Ref Range  ? Alcohol, Ethyl (B) <10 <10 mg/dL  ?  Comment: (NOTE) ?Lowest detectable limit for serum alcohol is 10 mg/dL. ? ?For medical purposes only. ?Performed at [facility], [address] [place], ?[place] [address] ?  ?Salicylate level     Status: Abnormal  ? Collection Time: 03/19/22  2:05 PM  ?Result Value Ref Range  ? Salicylate Lvl <7.0 (L) 7.0 - 30.0 mg/dL  ?  Comment: Performed at [facility], [address], [place], [place] [address]  ?Acetaminophen level     Status: Abnormal  ? Collection Time: 03/19/22  2:05 PM  ?Result Value Ref Range  ? Acetaminophen (Tylenol), Serum <10 (L) 10 - 30 ug/mL  ?  Comment: (NOTE) ?Therapeutic concentrations vary significantly. A range of 10-30 ug/mL  ?may be an effective concentration for many patients. However, some  ?are best treated at concentrations outside of this range. ?Acetaminophen concentrations >150 ug/mL at 4 hours after ingestion  ?and >50 ug/mL at 12 hours after ingestion are often associated with  ?toxic reactions. ? ?Performed at [facility], [address] [place], ?[place] [address] ?  ?Urine Drug Screen, Qualitative     Status: Abnormal  ? Collection Time: 03/19/22  2:05 PM  ?Result Value Ref Range  ? Tricyclic, Ur Screen NONE  DETECTED NONE DETECTED  ? Amphetamines, Ur Screen NONE DETECTED NONE DETECTED  ? MDMA (Ecstasy)Ur Screen NONE DETECTED NONE DETECTED  ? Cocaine Metabolite,Ur [place] POSITIVE (A) NONE DETECTED  ? Opiate, Ur Screen N

## 2022-03-20 NOTE — ED Notes (Addendum)
Pt reports he takes BP medication, but does not recall name of it since it is new. Will notify EDP of BP. ?

## 2022-03-20 NOTE — Consult Note (Addendum)
[facility] Face-to-Face Psychiatry Consult  ? ?Reason for Consult: Chest Pain and SI ?Referring Physician: Dr. Fuller Plan ?Patient Identification: Roy Chapla Kallam Sr. ?MRN:  [id number] ?Principal Diagnosis: <principal problem not specified> ?Diagnosis:  Active Problems: ?  HIV disease (HCC) ?  HTN (hypertension) ?  Cocaine dependence (HCC) ?  Substance induced mood disorder (HCC) ?  Malingering ?  Alcohol use disorder, severe, dependence (HCC) ?  Cocaine-induced mood disorder (HCC) ? ? ?Total Time spent with patient: 45 minutes ? ?Subjective: "I was at [facility], and we used crack, and  we got kicked out." ?Roy Liter Amodio Sr. is a 56 y.o. male patient presented to [facility] ED via EMS voluntary due to complain of chest pain that started today. Per the ED triage nurse's note, Pt smoked crack today and the past 3 days after years of sobriety. EMS gave 324 ASA and gave of fluid. Per EMS, pt was SOB and diaphoretic on arrival. The patient UDS is positive for cocaine. ?  ?This provider saw the patient face-to-face; the chart was reviewed, and consulted with Dr. Fuller Plan on 03/19/2022 due to the patient's care. It was discussed with the EDP that the patient remained under observation overnight and will be reassessed in the a.m. to determine if he meets the criteria for psychiatric inpatient admission; he could be discharged home due to him voicing SI w/o a plan. ? ?On evaluation, the patient is alert and oriented x 4, calm, cooperative, and mood-congruent with affect. The patient does have a history of malingering. The patient was at [facility] receiving substance treatment. The past three days, he and some of the other residents decided to get on a crack cocaine binge and was kicked out of the program. The patient is homeless and hopes to get admitted for 30 days. He stated he would see if he could get accepted to [facility]. There might be an SW consult to get him a spot at the shelter. ?The patient does not  appear to be responding to internal or external stimuli. Neither is the patient presenting with any delusional thinking. The patient denies auditory or visual hallucinations. The patient admitted to passive suicidal ideation but denied homicidal or self-harm ideations. The patient is not presenting with any psychotic or paranoid behaviors. During an encounter with the patient, he could answer questions appropriately. ? ?HPI:  Per Dr. Fuller Plan, Roy [place] Sr. is a 56 y.o. male who comes in with concerns for chest pain that started last night.  He reports using crack 3 days ago.  He also reports having some SI.  Patient reports a plan of overdosing on pillsPatient reports that he did take the pills of his blood pressure and held them in his hand but he did not actually take any of them.  Patient reports that he was sitting at the [facility] but then just got kicked out.  He reports the chest pain has since all resolved.  He denies any significant shortness of breath.  Reports a little bit of abdominal cramping.  ? ?Past Psychiatric History:  ?Bipolar disorder (HCC) ?Depression ? ?Risk to Self:   ?Risk to Others:   ?Prior Inpatient Therapy:   ?Prior Outpatient Therapy:   ? ?Past Medical History:  ?Past Medical History:  ?Diagnosis Date  ? AIDS (acquired immune deficiency syndrome) (HCC)   ? Arthritis   ? Asthma   ? Bipolar disorder (HCC)   ? Bronchitis   ? Complication of anesthesia   ?  Coronary artery disease   ? Depression   ? Dysrhythmia   ? 1st degree heart block/ brady  ? GERD (gastroesophageal reflux disease)   ? Hepatitis C   ? treated  ? HIV (human immunodeficiency virus infection) (HCC)   ? HTN (hypertension)   ? PONV (postoperative nausea and vomiting)   ?  ?Past Surgical History:  ?Procedure Laterality Date  ? APPLICATION OF WOUND VAC Right 09/01/2019  ? Procedure: APPLICATION OF WOUND VAC;  Surgeon: Kennedy Bucker, MD;  Location: [facility] ORS;  Service: Orthopedics;  Laterality: Right;  Serial # [id number]   ? HERNIA REPAIR Left   ? inguinal  ? TOE SURGERY    ? TOE SURGERY Right   ? TOTAL HIP ARTHROPLASTY Right 09/01/2019  ? Procedure: TOTAL HIP ARTHROPLASTY ANTERIOR APPROACH;  Surgeon: Kennedy Bucker, MD;  Location: [facility] ORS;  Service: Orthopedics;  Laterality: Right;  ? ?Family History:  ?Family History  ?Problem Relation Age of Onset  ? Cancer Brother   ? Uterine cancer Mother   ? CAD Mother   ? Hypertension Mother   ? Hyperlipidemia Mother   ? ?Family Psychiatric  History:  ?Social History:  ?Social History  ? ?Substance and Sexual Activity  ?Alcohol Use Yes  ? Alcohol/week: 84.0 standard drinks  ? Types: 84 Cans of beer per week  ? Comment: daily  ?   ?Social History  ? ?Substance and Sexual Activity  ?Drug Use Yes  ? Types: Cocaine, "Crack" cocaine  ? Comment: 01/23/2021  ?  ?Social History  ? ?Socioeconomic History  ? Marital status: Divorced  ?  Spouse name: Not on file  ? Number of children: Not on file  ? Years of education: Not on file  ? Highest education level: Not on file  ?Occupational History  ? Not on file  ?Tobacco Use  ? Smoking status: Every Day  ?  Packs/day: 0.25  ?  Types: Cigarettes  ? Smokeless tobacco: Never  ?Vaping Use  ? Vaping Use: Never used  ?Substance and Sexual Activity  ? Alcohol use: Yes  ?  Alcohol/week: 84.0 standard drinks  ?  Types: 84 Cans of beer per week  ?  Comment: daily  ? Drug use: Yes  ?  Types: Cocaine, "Crack" cocaine  ?  Comment: 01/23/2021  ? Sexual activity: Yes  ?Other Topics Concern  ? Not on file  ?Social History Narrative  ? Not on file  ? ?Social Determinants of Health  ? ?Financial Resource Strain: Not on file  ?Food Insecurity: Not on file  ?Transportation Needs: Not on file  ?Physical Activity: Not on file  ?Stress: Not on file  ?Social Connections: Not on file  ? ?Additional Social History: ?  ? ?Allergies:   ?Allergies  ?Allergen Reactions  ? Amlodipine Swelling  ?  Of the tongue  ? Lisinopril Swelling  ? Lactose Other (See Comments)  ?  GI distress  ? Pollen  Extract Other (See Comments)  ?  Itchy eyes and runny nose  ? ? ?Labs:  ?Results for orders placed or performed during the hospital encounter of 03/19/22 (from the past 48 hour(s))  ?CBC     Status: None  ? Collection Time: 03/19/22  2:05 PM  ?Result Value Ref Range  ? WBC 6.1 4.0 - 10.5 K/uL  ? RBC 4.61 4.22 - 5.81 MIL/uL  ? Hemoglobin 14.7 13.0 - 17.0 g/dL  ? HCT 43.3 39.0 - 52.0 %  ? MCV 93.9 80.0 - 100.0 fL  ?  MCH 31.9 26.0 - 34.0 pg  ? MCHC 33.9 30.0 - 36.0 g/dL  ? RDW 12.3 11.5 - 15.5 %  ? Platelets 221 150 - 400 K/uL  ? nRBC 0.0 0.0 - 0.2 %  ?  Comment: Performed at [facility], [address], [place], [place] [address]  ?Troponin I (High Sensitivity)     Status: Abnormal  ? Collection Time: 03/19/22  2:05 PM  ?Result Value Ref Range  ? Troponin I (High Sensitivity) 25 (H) <18 ng/L  ?  Comment: (NOTE) ?Elevated high sensitivity troponin I (hsTnI) values and significant  ?changes across serial measurements may suggest ACS but many other  ?chronic and acute conditions are known to elevate hsTnI results.  ?Refer to the "Links" section for chest pain algorithms and additional  ?guidance. ?Performed at [facility], [address] [place], ?[place] [address] ?  ?Comprehensive metabolic panel     Status: Abnormal  ? Collection Time: 03/19/22  2:05 PM  ?Result Value Ref Range  ? Sodium 140 135 - 145 mmol/L  ? Potassium 3.4 (L) 3.5 - 5.1 mmol/L  ? Chloride 107 98 - 111 mmol/L  ? CO2 20 (L) 22 - 32 mmol/L  ? Glucose, Bld 80 70 - 99 mg/dL  ?  Comment: Glucose reference range applies only to samples taken after fasting for at least 8 hours.  ? BUN 36 (H) 6 - 20 mg/dL  ? Creatinine, Ser 1.81 (H) 0.61 - 1.24 mg/dL  ? Calcium 9.4 8.9 - 10.3 mg/dL  ? Total Protein 8.4 (H) 6.5 - 8.1 g/dL  ? Albumin 4.5 3.5 - 5.0 g/dL  ? AST 41 15 - 41 U/L  ? ALT 30 0 - 44 U/L  ? Alkaline Phosphatase 70 38 - 126 U/L  ? Total Bilirubin 1.4 (H) 0.3 - 1.2 mg/dL  ? GFR, Estimated 43 (L) >60 mL/min  ?  Comment:  (NOTE) ?Calculated using the CKD-EPI Creatinine Equation (2021) ?  ? Anion gap 13 5 - 15  ?  Comment: Performed at [facility], [address], [place], [place] [address]  ?Ethanol     Status: None  ?

## 2022-03-20 NOTE — ED Notes (Addendum)
Awaiting Safe Transport to [facility] in [place], [place]. Pt given all of belongings and discharge ?

## 2022-04-02 ENCOUNTER — Emergency Department
Admission: EM | Admit: 2022-04-02 | Discharge: 2022-04-02 | Disposition: A | Discharge status: Home / Self Care | Payer: No Typology Code available for payment source | Attending: Student in an Organized Health Care Education/Training Program | Admitting: Student in an Organized Health Care Education/Training Program

## 2022-04-02 ENCOUNTER — Other Ambulatory Visit: Payer: Self-pay

## 2022-04-02 ENCOUNTER — Encounter: Payer: Self-pay | Admitting: Intensive Care

## 2022-04-02 DIAGNOSIS — M1610 Unilateral primary osteoarthritis, unspecified hip: Secondary | ICD-10-CM

## 2022-04-02 DIAGNOSIS — Z20822 Contact with and (suspected) exposure to covid-19: Secondary | ICD-10-CM | POA: Insufficient documentation

## 2022-04-02 DIAGNOSIS — Y900 Blood alcohol level of less than 20 mg/100 ml: Secondary | ICD-10-CM | POA: Diagnosis not present

## 2022-04-02 DIAGNOSIS — R45851 Suicidal ideations: Secondary | ICD-10-CM | POA: Insufficient documentation

## 2022-04-02 DIAGNOSIS — F1420 Cocaine dependence, uncomplicated: Secondary | ICD-10-CM | POA: Diagnosis present

## 2022-04-02 LAB — RESP PANEL BY RT-PCR (FLU A&B, COVID) ARPGX2
Influenza A by PCR: NEGATIVE
Influenza B by PCR: NEGATIVE
SARS Coronavirus 2 by RT PCR: NEGATIVE

## 2022-04-02 LAB — CBC
HCT: 41.2 % (ref 39.0–52.0)
Hemoglobin: 14 g/dL (ref 13.0–17.0)
MCH: 32 pg (ref 26.0–34.0)
MCHC: 34 g/dL (ref 30.0–36.0)
MCV: 94.1 fL (ref 80.0–100.0)
Platelets: 221 10*3/uL (ref 150–400)
RBC: 4.38 MIL/uL (ref 4.22–5.81)
RDW: 11.9 % (ref 11.5–15.5)
WBC: 4.7 10*3/uL (ref 4.0–10.5)
nRBC: 0 % (ref 0.0–0.2)

## 2022-04-02 LAB — COMPREHENSIVE METABOLIC PANEL
ALT: 17 U/L (ref 0–44)
AST: 20 U/L (ref 15–41)
Albumin: 3.8 g/dL (ref 3.5–5.0)
Alkaline Phosphatase: 66 U/L (ref 38–126)
Anion gap: 7 (ref 5–15)
BUN: 18 mg/dL (ref 6–20)
CO2: 24 mmol/L (ref 22–32)
Calcium: 9 mg/dL (ref 8.9–10.3)
Chloride: 106 mmol/L (ref 98–111)
Creatinine, Ser: 1.25 mg/dL — ABNORMAL HIGH (ref 0.61–1.24)
GFR, Estimated: >60 mL/min (ref >60)
Glucose, Bld: 105 mg/dL — ABNORMAL HIGH (ref 70–99)
Potassium: 3.6 mmol/L (ref 3.5–5.1)
Sodium: 137 mmol/L (ref 135–145)
Total Bilirubin: 0.7 mg/dL (ref 0.3–1.2)
Total Protein: 7.2 g/dL (ref 6.5–8.1)

## 2022-04-02 LAB — ETHANOL: Alcohol, Ethyl (B): <10 mg/dL (ref <10)

## 2022-04-02 NOTE — ED Provider Notes (Signed)
? ?[facility] ?Provider Note ? ? ? Event Date/Time  ? First MD Initiated Contact with Patient 04/02/22 1540   ?  (approximate) ? ? ?History  ? ?Suicidal and Pain ? ? ?HPI ? ?Roy Mccranie Campas Sr. is a 56 y.o. male presents the ER for suicidal ideations stating he plans to overdose on medications due to chronic pain.  States has been dealing with chronic pain for years having trouble getting into pain clinic.  States he is just tired of dealing with the pain.  Denies any new pain symptoms.  Denies any chest pain or shortness of breath. ?  ? ? ?Physical Exam  ? ?Triage Vital Signs: ?ED Triage Vitals  ?Enc Vitals Group  ?   BP 04/02/22 1529 135/90  ?   Pulse Rate 04/02/22 1529 (!) 56  ?   Resp 04/02/22 1529 16  ?   Temp 04/02/22 1529 98.6 ?F (37 ?C)  ?   Temp Source 04/02/22 1529 Oral  ?   SpO2 04/02/22 1529 99 %  ?   Weight 04/02/22 1531 210 lb (95.3 kg)  ?   Height 04/02/22 1531 5\' 9"  (1.753 m)  ?   Head Circumference --   ?   Peak Flow --   ?   Pain Score 04/02/22 1531 10  ?   Pain Loc --   ?   Pain Edu? --   ?   Excl. in [place]? --   ? ? ?Most recent vital signs: ?Vitals:  ? 04/02/22 1529  ?BP: 135/90  ?Pulse: (!) 56  ?Resp: 16  ?Temp: 98.6 ?F (37 ?C)  ?SpO2: 99%  ? ? ? ?Constitutional: Alert  ?Eyes: Conjunctivae are normal.  ?Head: Atraumatic. ?Nose: No congestion/rhinnorhea. ?Mouth/Throat: Mucous membranes are moist.   ?Neck: Painless ROM.  ?Cardiovascular:   Good peripheral circulation. ?Respiratory: Normal respiratory effort.  No retractions.  ?Gastrointestinal: Soft and nontender.  ?Musculoskeletal:  no deformity ?Neurologic:  MAE spontaneously. No gross focal neurologic deficits are appreciated.  ?Skin:  Skin is warm, dry and intact. No rash noted. ?Psychiatric: Mood and affect are normal. Speech and behavior are normal. ? ? ? ?ED Results / Procedures / Treatments  ? ?Labs ?(all labs ordered are listed, but only abnormal results are displayed) ?Labs Reviewed  ?COMPREHENSIVE METABOLIC PANEL  - Abnormal; Notable for the following components:  ?    Result Value  ? Glucose, Bld 105 (*)   ? Creatinine, Ser 1.25 (*)   ? All other components within normal limits  ?RESP PANEL BY RT-PCR (FLU A&B, COVID) ARPGX2  ?ETHANOL  ?CBC  ?URINE DRUG SCREEN, QUALITATIVE ([facility] ONLY)  ? ? ? ?EKG ? ? ? ? ?RADIOLOGY ? ? ? ?PROCEDURES: ? ?Critical Care performed:  ? ?Procedures ? ? ?MEDICATIONS ORDERED IN ED: ?Medications - No data to display ? ? ?IMPRESSION / MDM / ASSESSMENT AND PLAN / ED COURSE  ?I reviewed the triage vital signs and the nursing notes. ?             ?               ? ?Differential diagnosis includes, but is not limited to, Psychosis, delirium, medication effect, noncompliance, polysubstance abuse, Si, Hi, depression ? ?Patient presenting with chronic pain stating that he is suicidal.  States is related to chronic pain.  He is here voluntary he would like to speak to psychiatry.  Basic blood work was ordered but patient is denying any acute or  change to his pain therefore I do not feel that further will medical work-up in the ER is clinically indicated at this time. ? ?Clinical Course as of 04/02/22 1730  ?Mon Apr 02, 2022  ?1713 Patient has been evaluated by psychiatry at bedside not felt to require psychiatric hospitalization at this time.  Most of his symptoms and complaints are related to chronic pain syndrome.  Patient will be given referral to outpatient clinic. [PR]  ?  ?Clinical Course User Index ?[PR] Merlyn Lot, MD  ? ? ? ?FINAL CLINICAL IMPRESSION(S) / ED DIAGNOSES  ? ?Final diagnoses:  ?Suicidal ideations  ? ? ? ?Rx / DC Orders  ? ?ED Discharge Orders   ? ? None  ? ?  ? ? ? ?Note:  This document was prepared using Dragon voice recognition software and may include unintentional dictation errors. ? ?  ?Merlyn Lot, MD ?04/02/22 1730 ? ?

## 2022-04-02 NOTE — ED Triage Notes (Addendum)
Patient reports "I hurt all over and no one will prescribe me chronic pain medication bc of my drug history. I just want to kill myself" Reports his plan is to take a lot of medication. Reports hx of SI years ago. Hx arthritis. Reports using crack cocaine last night ?

## 2022-04-02 NOTE — ED Notes (Signed)
Pt states SI.  Pt reports in pain all the time and no meds prescribed for him.  Pt lives alone at his home.  Pt denies HI.  Pt denies ETOH use today  pt alert.  Pt eating a dinner meal now.  ?

## 2022-04-02 NOTE — ED Notes (Signed)
Pt signed esignature.  D/c  inst to pt.  

## 2022-04-02 NOTE — Consult Note (Addendum)
[facility] Face-to-Face Psychiatry Consult  ? ?Reason for Consult: Chronic pain/substance abuse ?Referring Physician: Roxan [name] ?Patient Identification: Roy Grelle Thornell Sr. ?MRN:  [id number] ?Principal Diagnosis: Arthritis pain, hip ?Diagnosis:  Principal Problem: ?  Arthritis pain, hip ?Active Problems: ?  Cocaine dependence (HCC) ? ? ?Total Time spent with patient: 30 minutes ? ?Subjective:  " Nobody will help me with my pain." ?Roy Liter Mcquarrie Sr. is a 56 y.o. male patient admitted with chronic pain, substance abuse. ? ?HPI: Patient is well known to this facility.  He comes in today, stating that he "maybe" wants to die because nobody will help him with his chronic pain.  He admits to using crack cocaine last night, as he states that helps his pain.  He denies current suicidal thoughts or plan.  He states that he has a court date tomorrow and that his peer support person is going to pick him up in the emergency department in the morning for court.  Patient denies homicidal thoughts.  Denies auditory or visual hallucinations.  Patient is speaking in linear sentences.  Does not appear to be responding to internal stimuli.  He is calm and cooperative.  Patient does not meet criteria for inpatient psychiatric hospitalization.  Cautioned patient against using substances and that he needs to get a referral from his primary care doctor or a pain clinic to address his chronic pain in his hips.  Patient voices understanding. ? ?Past Psychiatric History: Substance abuse; cocaine dependence; malingering; alcohol use disorder ? ?Risk to Self:   ?Risk to Others:   ?Prior Inpatient Therapy:   ?Prior Outpatient Therapy:   ? ?Past Medical History:  ?Past Medical History:  ?Diagnosis Date  ? AIDS (acquired immune deficiency syndrome) (HCC)   ? Arthritis   ? Asthma   ? Bipolar disorder (HCC)   ? Bronchitis   ? Complication of anesthesia   ? Coronary artery disease   ? Depression   ? Dysrhythmia   ? 1st degree heart block/ brady  ?  GERD (gastroesophageal reflux disease)   ? Hepatitis C   ? treated  ? HIV (human immunodeficiency virus infection) (HCC)   ? HTN (hypertension)   ? PONV (postoperative nausea and vomiting)   ?  ?Past Surgical History:  ?Procedure Laterality Date  ? APPLICATION OF WOUND VAC Right 09/01/2019  ? Procedure: APPLICATION OF WOUND VAC;  Surgeon: Kennedy Bucker, MD;  Location: [facility] ORS;  Service: Orthopedics;  Laterality: Right;  Serial # [id number]  ? HERNIA REPAIR Left   ? inguinal  ? TOE SURGERY    ? TOE SURGERY Right   ? TOTAL HIP ARTHROPLASTY Right 09/01/2019  ? Procedure: TOTAL HIP ARTHROPLASTY ANTERIOR APPROACH;  Surgeon: Kennedy Bucker, MD;  Location: [facility] ORS;  Service: Orthopedics;  Laterality: Right;  ? ?Family History:  ?Family History  ?Problem Relation Age of Onset  ? Cancer Brother   ? Uterine cancer Mother   ? CAD Mother   ? Hypertension Mother   ? Hyperlipidemia Mother   ? ?Family Psychiatric  History: None disclosed ?Social History:  ?Social History  ? ?Substance and Sexual Activity  ?Alcohol Use Not Currently  ? Alcohol/week: 84.0 standard drinks  ? Types: 84 Cans of beer per week  ? Comment: daily  ?   ?Social History  ? ?Substance and Sexual Activity  ?Drug Use Yes  ? Types: Cocaine, "Crack" cocaine  ? Comment: 01/23/2021  ?  ?Social History  ? ?Socioeconomic History  ? Marital status: Divorced  ?  Spouse name: Not on file  ? Number of children: Not on file  ? Years of education: Not on file  ? Highest education level: Not on file  ?Occupational History  ? Not on file  ?Tobacco Use  ? Smoking status: Every Day  ?  Packs/day: 0.25  ?  Types: Cigarettes  ? Smokeless tobacco: Never  ?Vaping Use  ? Vaping Use: Never used  ?Substance and Sexual Activity  ? Alcohol use: Not Currently  ?  Alcohol/week: 84.0 standard drinks  ?  Types: 84 Cans of beer per week  ?  Comment: daily  ? Drug use: Yes  ?  Types: Cocaine, "Crack" cocaine  ?  Comment: 01/23/2021  ? Sexual activity: Yes  ?Other Topics Concern  ? Not on file   ?Social History Narrative  ? Not on file  ? ?Social Determinants of Health  ? ?Financial Resource Strain: Not on file  ?Food Insecurity: Not on file  ?Transportation Needs: Not on file  ?Physical Activity: Not on file  ?Stress: Not on file  ?Social Connections: Not on file  ? ?Additional Social History: ?  ? ?Allergies:   ?Allergies  ?Allergen Reactions  ? Amlodipine Swelling  ?  Of the tongue  ? Lisinopril Swelling  ? Lactose Other (See Comments)  ?  GI distress  ? Pollen Extract Other (See Comments)  ?  Itchy eyes and runny nose  ? ? ?Labs:  ?Results for orders placed or performed during the hospital encounter of 04/02/22 (from the past 48 hour(s))  ?Comprehensive metabolic panel     Status: Abnormal  ? Collection Time: 04/02/22  3:34 PM  ?Result Value Ref Range  ? Sodium 137 135 - 145 mmol/L  ? Potassium 3.6 3.5 - 5.1 mmol/L  ? Chloride 106 98 - 111 mmol/L  ? CO2 24 22 - 32 mmol/L  ? Glucose, Bld 105 (H) 70 - 99 mg/dL  ?  Comment: Glucose reference range applies only to samples taken after fasting for at least 8 hours.  ? BUN 18 6 - 20 mg/dL  ? Creatinine, Ser 1.25 (H) 0.61 - 1.24 mg/dL  ? Calcium 9.0 8.9 - 10.3 mg/dL  ? Total Protein 7.2 6.5 - 8.1 g/dL  ? Albumin 3.8 3.5 - 5.0 g/dL  ? AST 20 15 - 41 U/L  ? ALT 17 0 - 44 U/L  ? Alkaline Phosphatase 66 38 - 126 U/L  ? Total Bilirubin 0.7 0.3 - 1.2 mg/dL  ? GFR, Estimated >60 >60 mL/min  ?  Comment: (NOTE) ?Calculated using the CKD-EPI Creatinine Equation (2021) ?  ? Anion gap 7 5 - 15  ?  Comment: Performed at [facility], [address] [place], [place] [address]  ?Ethanol     Status: None  ? Collection Time: 04/02/22  3:34 PM  ?Result Value Ref Range  ? Alcohol, Ethyl (B) <10 <10 mg/dL  ?  Comment: (NOTE) ?Lowest detectable limit for serum alcohol is 10 mg/dL. ? ?For medical purposes only. ?Performed at [facility], [address] [place], ?[place] [address] ?  ?cbc     Status: None  ? Collection Time: 04/02/22  3:34 PM  ?Result Value  Ref Range  ? WBC 4.7 4.0 - 10.5 K/uL  ? RBC 4.38 4.22 - 5.81 MIL/uL  ? Hemoglobin 14.0 13.0 - 17.0 g/dL  ? HCT 41.2 39.0 - 52.0 %  ? MCV 94.1 80.0 - 100.0 fL  ? MCH 32.0 26.0 - 34.0 pg  ?  MCHC 34.0 30.0 - 36.0 g/dL  ? RDW 11.9 11.5 - 15.5 %  ? Platelets 221 150 - 400 K/uL  ? nRBC 0.0 0.0 - 0.2 %  ?  Comment: Performed at [facility], [address] [place], [place] [address]  ? ? ?No current facility-administered medications for this encounter.  ? ?Current Outpatient Medications  ?Medication Sig Dispense Refill  ? albuterol (VENTOLIN HFA) 108 (90 Base) MCG/ACT inhaler Inhale 1-2 puffs into the lungs every 4 (four) hours as needed for wheezing or shortness of breath. (Patient not taking: Reported on 03/19/2022) 18 g 1  ? citalopram (CELEXA) 40 MG tablet Take 1 tablet (40 mg total) by mouth daily. (Patient not taking: Reported on 03/19/2022) 30 tablet 1  ? hydrOXYzine (ATARAX/VISTARIL) 50 MG tablet Take 1 tablet (50 mg total) by mouth 3 (three) times daily as needed. (Patient not taking: Reported on 03/19/2022) 30 tablet 0  ? mirtazapine (REMERON) 7.5 MG tablet Take 1 tablet (7.5 mg total) by mouth at bedtime. (Patient not taking: Reported on 03/19/2022) 30 tablet 1  ? risperiDONE (RISPERDAL) 1 MG tablet Take 1 tablet (1 mg total) by mouth at bedtime. (Patient not taking: Reported on 03/19/2022) 30 tablet 1  ? traZODone (DESYREL) 150 MG tablet Take 1 tablet (150 mg total) by mouth at bedtime as needed for sleep. (Patient not taking: Reported on 03/19/2022) 30 tablet 1  ? ? ?Musculoskeletal: ?Strength & Muscle Tone: within normal limits ?Gait & Station:  Did not observe ?Patient leans: N/A ? ?Psychiatric Specialty Exam: ? ?Presentation  ?General Appearance: Appropriate for Environment ?Eye Contact:Fair ? ?Speech:Clear and Coherent ? ?Speech Volume:Normal ? ?Handedness:Right ? ? ?Mood and Affect  ?Mood:Euthymic ? ?Affect:Congruent ? ? ?Thought Process  ?Thought Processes:Coherent ? ?Descriptions of  Associations:Intact ? ?Orientation:Full (Time, Place and Person) ? ?Thought Content:WDL ? ?History of Schizophrenia/Schizoaffective disorder:No data recorded ?Duration of Psychotic Symptoms:No data recorded ?Hallucinations:Hal

## 2022-04-02 NOTE — BH Assessment (Signed)
Comprehensive Clinical Assessment (CCA) Screening, Triage and Referral Note ? ?04/02/2022 ?Davonte Macvicar Ledonne Sr. ?[id number] ? ?Virl Axe Sr, 56 year old male who presents to [facility] ED voluntarily for treatment. Per triage note, Patient reports "I hurt all over and no one will prescribe me chronic pain medication bc of my drug history. I just want to kill myself" Reports his plan is to take a lot of medication. Reports hx of SI years ago. Hx arthritis. Reports using crack cocaine last night.  ? ?During TTS assessment pt presents alert and oriented x 4, restless but cooperative, and mood-congruent with affect. The pt does not appear to be responding to internal or external stimuli. Neither is the pt presenting with any delusional thinking. Pt verified the information provided to triage RN.  ? ?Pt identifies his main complaint to be that he is in severe pain. Patient states he wants medication for his arthritis but no one will prescribe it due to his current drug use. ?I need help and no one will help me.? Patient states he sees a physician at [facility] in [facility]. Patient reports in the beginning he was prescribed medicine that stopped the pain but is no longer able to receive that particular medication. Patient admits to using crack/cocaine last night. ?It is the only thing that makes the pain go away.? Pt reports previous INPT hx a few years ago at [facility] where he stayed 7 days. Patient states he does not want inpatient treatment now because he has court tomm morning and he must be in attendance. Pt reports prior SI attempt years ago where he took several HIV meds and was hospitalized. Pt denies current SI/HI/AH/VH. Patient presents irritable and frustrated.   ? ?Disposition pending  ? ?Chief Complaint:  ?Chief Complaint  ?Patient presents with  ? Suicidal  ? Pain  ? ?Visit Diagnosis: Cocaine dependence ? ?Patient Reported Information ?How did you hear about [place]? Self ? ?What Is the Reason for  Your Visit/Call Today? Patient states he needs help with chronic arthritis pain. ? ?How Long Has This Been Causing You Problems? > than 6 months ? ?What Do You Feel Would Help You the Most Today? Medication(s) ? ? ?Have You Recently Had Any Thoughts About Hurting Yourself? Yes ? ?Are You Planning to Commit Suicide/Harm Yourself At This time? No ? ? ?Have you Recently Had Thoughts About [place]? No ? ?Are You Planning to Harm Someone at This Time? No ? ?Explanation: No data recorded ? ?Have You Used Any Alcohol or Drugs in the Past 24 Hours? Yes ? ?How Long Ago Did You Use Drugs or Alcohol? No data recorded ?What Did You Use and How Much? Crack/cocaine ? ? ?Do You Currently Have a Therapist/Psychiatrist? No ? ?Name of Therapist/Psychiatrist: No data recorded ? ?Have You Been Recently Discharged From Any Office Practice or Programs? No ? ?Explanation of Discharge From Practice/Program: No data recorded ?  ?CCA Screening Triage Referral Assessment ?Type of Contact: Face-to-Face ? ?Telemedicine Service Delivery:   ?Is this Initial or Reassessment? No data recorded ?Date Telepsych consult ordered in [facility]:  No data recorded ?Time Telepsych consult ordered in [facility]:  No data recorded ?Location of Assessment: [facility] ED ? ?Provider Location: [facility] ED ? ? ?Collateral Involvement: None provided ? ? ?Does Patient Have a Stage manager Guardian? No data recorded ?Name and Contact of Legal Guardian: No data recorded ?If Minor and Not Living with Parent(s), Who has Custody? n/a ? ?Is CPS involved  or ever been involved? Never ? ?Is APS involved or ever been involved? Never ? ? ?Patient Determined To Be At Risk for Harm To Self or Others Based on Review of Patient Reported Information or Presenting Complaint? No ? ?Method: No data recorded ?Availability of Means: No data recorded ?Intent: No data recorded ?Notification Required: No data recorded ?Additional Information for Danger to Others Potential: No data  recorded ?Additional Comments for Danger to Others Potential: No data recorded ?Are There Guns or Other Weapons in [place]? No data recorded ?Types of Guns/Weapons: No data recorded ?Are These Weapons Safely Secured?                            No data recorded ?Who Could Verify You Are Able To Have These Secured: No data recorded ?Do You Have any Outstanding Charges, Pending Court Dates, Parole/Probation? No data recorded ?Contacted To Inform of Risk of Harm To Self or Others: No data recorded ? ?Does Patient Present under Involuntary Commitment? No ? ?IVC Papers Initial File Date: No data recorded ? ?[place] of Residence: [place] ? ? ?Patient Currently Receiving the Following Services: Medication Management ? ? ?Determination of Need: Urgent (48 hours) ? ? ?Options For Referral: ED Visit; Medication Management ? ? ?Discharge Disposition:  ?  ? ?Toua Stites Glennon Mac, Counselor, LCAS-A ? ? ?  ?  ?  ? ? ?

## 2022-04-18 ENCOUNTER — Other Ambulatory Visit: Payer: Self-pay

## 2022-04-18 ENCOUNTER — Emergency Department: Payer: Medicaid Other

## 2022-04-18 ENCOUNTER — Emergency Department
Admission: EM | Admit: 2022-04-18 | Discharge: 2022-04-19 | Disposition: A | Discharge status: Home / Self Care | Payer: Medicaid Other | Attending: Emergency Medicine | Admitting: Emergency Medicine

## 2022-04-18 ENCOUNTER — Encounter: Payer: Self-pay | Admitting: Emergency Medicine

## 2022-04-18 DIAGNOSIS — Y909 Presence of alcohol in blood, level not specified: Secondary | ICD-10-CM | POA: Insufficient documentation

## 2022-04-18 DIAGNOSIS — R0789 Other chest pain: Secondary | ICD-10-CM | POA: Insufficient documentation

## 2022-04-18 DIAGNOSIS — F1010 Alcohol abuse, uncomplicated: Secondary | ICD-10-CM | POA: Diagnosis not present

## 2022-04-18 DIAGNOSIS — Z20822 Contact with and (suspected) exposure to covid-19: Secondary | ICD-10-CM | POA: Diagnosis not present

## 2022-04-18 DIAGNOSIS — F1410 Cocaine abuse, uncomplicated: Secondary | ICD-10-CM | POA: Insufficient documentation

## 2022-04-18 DIAGNOSIS — I2510 Atherosclerotic heart disease of native coronary artery without angina pectoris: Secondary | ICD-10-CM | POA: Diagnosis not present

## 2022-04-18 LAB — CBC WITH DIFFERENTIAL/PLATELET
Abs Immature Granulocytes: 0.02 10*3/uL (ref 0.00–0.07)
Basophils Absolute: 0 10*3/uL (ref 0.0–0.1)
Basophils Relative: 1 %
Eosinophils Absolute: 0.1 10*3/uL (ref 0.0–0.5)
Eosinophils Relative: 1 %
HCT: 44.9 % (ref 39.0–52.0)
Hemoglobin: 15.2 g/dL (ref 13.0–17.0)
Immature Granulocytes: 0 %
Lymphocytes Relative: 43 %
Lymphs Abs: 2.4 10*3/uL (ref 0.7–4.0)
MCH: 32.2 pg (ref 26.0–34.0)
MCHC: 33.9 g/dL (ref 30.0–36.0)
MCV: 95.1 fL (ref 80.0–100.0)
Monocytes Absolute: 0.4 10*3/uL (ref 0.1–1.0)
Monocytes Relative: 8 %
Neutro Abs: 2.6 10*3/uL (ref 1.7–7.7)
Neutrophils Relative %: 47 %
Platelets: 229 10*3/uL (ref 150–400)
RBC: 4.72 MIL/uL (ref 4.22–5.81)
RDW: 13 % (ref 11.5–15.5)
WBC: 5.6 10*3/uL (ref 4.0–10.5)
nRBC: 0 % (ref 0.0–0.2)

## 2022-04-18 LAB — COMPREHENSIVE METABOLIC PANEL
ALT: 49 U/L — ABNORMAL HIGH (ref 0–44)
AST: 26 U/L (ref 15–41)
Albumin: 4.5 g/dL (ref 3.5–5.0)
Alkaline Phosphatase: 76 U/L (ref 38–126)
Anion gap: 10 (ref 5–15)
BUN: 24 mg/dL — ABNORMAL HIGH (ref 6–20)
CO2: 22 mmol/L (ref 22–32)
Calcium: 9.5 mg/dL (ref 8.9–10.3)
Chloride: 105 mmol/L (ref 98–111)
Creatinine, Ser: 1.44 mg/dL — ABNORMAL HIGH (ref 0.61–1.24)
GFR, Estimated: 57 mL/min — ABNORMAL LOW (ref >60)
Glucose, Bld: 98 mg/dL (ref 70–99)
Potassium: 3.9 mmol/L (ref 3.5–5.1)
Sodium: 137 mmol/L (ref 135–145)
Total Bilirubin: 0.8 mg/dL (ref 0.3–1.2)
Total Protein: 8.2 g/dL — ABNORMAL HIGH (ref 6.5–8.1)

## 2022-04-18 LAB — URINE DRUG SCREEN, QUALITATIVE (ARMC ONLY)
Amphetamines, Ur Screen: NOT DETECTED
Barbiturates, Ur Screen: NOT DETECTED
Benzodiazepine, Ur Scrn: POSITIVE — AB
Cannabinoid 50 Ng, Ur ~~LOC~~: NOT DETECTED
Cocaine Metabolite,Ur ~~LOC~~: POSITIVE — AB
MDMA (Ecstasy)Ur Screen: NOT DETECTED
Methadone Scn, Ur: NOT DETECTED
Opiate, Ur Screen: NOT DETECTED
Phencyclidine (PCP) Ur S: NOT DETECTED
Tricyclic, Ur Screen: NOT DETECTED

## 2022-04-18 LAB — RESP PANEL BY RT-PCR (FLU A&B, COVID) ARPGX2
Influenza A by PCR: NEGATIVE
Influenza B by PCR: NEGATIVE
SARS Coronavirus 2 by RT PCR: NEGATIVE

## 2022-04-18 LAB — TROPONIN I (HIGH SENSITIVITY)
Troponin I (High Sensitivity): 17 ng/L (ref <18)
Troponin I (High Sensitivity): 12 ng/L (ref <18)

## 2022-04-18 LAB — BRAIN NATRIURETIC PEPTIDE: B Natriuretic Peptide: 46.1 pg/mL (ref 0.0–100.0)

## 2022-04-18 MED ORDER — ONDANSETRON 4 MG PO TBDP
4.0000 mg | ORAL_TABLET | Freq: Once | ORAL | Status: AC
  Administered 2022-04-18: 4 mg via ORAL
  Filled 2022-04-18: qty 1

## 2022-04-18 NOTE — ED Notes (Signed)
Vol patient to be d/c per TOC ?

## 2022-04-18 NOTE — ED Notes (Signed)
Pt given meal tray.

## 2022-04-18 NOTE — ED Triage Notes (Signed)
Pt via [facility] [place]. Pt has a hx of cocaine abuse, last use was last night, pt also endorses drink alcohol. Pt c/o L sided CP, under pt L breast that started last night. Pt has a hx of MI and HTN. 12 lead unremarkable EKG. EMS gave 324 ASA and 1 Nitro Spray. Pt is A&Ox4 and NAD.  ?

## 2022-04-18 NOTE — ED Provider Notes (Signed)
? ?[facility] ?Provider Note ? ? Event Date/Time  ? First MD Initiated Contact with Patient 04/18/22 [phone]   ?  (approximate) ?History  ?Chest Pain and Drug Problem ? ?HPI ?Roy Teall Bodenheimer Sr. is a 56 y.o. male with a stated past medical history of cocaine and alcohol abuse as well as CAD who presents for left-sided chest pain after a reported "3-day bender".  Patient states that he has been drinking, smoking crack cocaine, and not eating/drinking over the past 3 days with associated worsening left-sided chest pain.  Patient states that this pain is worsened with palpation and slightly with movement.  Patient states that exertion actually improves this pain.  Patient also requesting resources for detoxification from cocaine and alcohol.  Patient currently denies any vision changes, tinnitus, difficulty speaking, facial droop, sore throat, abdominal pain, nausea/vomiting/diarrhea, dysuria, or weakness/numbness/paresthesias in any extremity ?Physical Exam  ?Triage Vital Signs: ?ED Triage Vitals  ?Enc Vitals Group  ?   BP   ?   Pulse   ?   Resp   ?   Temp   ?   Temp src   ?   SpO2   ?   Weight   ?   Height   ?   Head Circumference   ?   Peak Flow   ?   Pain Score   ?   Pain Loc   ?   Pain Edu?   ?   Excl. in [place]?   ? ?Most recent vital signs: ?Vitals:  ? 04/18/22 1107 04/19/22 0732  ?BP: 111/81 103/70  ?Pulse: 71 60  ?Resp: 19 17  ?Temp:  97.9 ?F (36.6 ?C)  ?SpO2: 100% 98%  ? ?General: Awake, oriented x4. ?CV:  Good peripheral perfusion.  ?Resp:  Normal effort.  ?Abd:  No distention.  ?Other:  Middle-aged overweight African-American male laying in bed in no distress ?ED Results / Procedures / Treatments  ?Labs ?(all labs ordered are listed, but only abnormal results are displayed) ?Labs Reviewed  ?COMPREHENSIVE METABOLIC PANEL - Abnormal; Notable for the following components:  ?    Result Value  ? BUN 24 (*)   ? Creatinine, Ser 1.44 (*)   ? Total Protein 8.2 (*)   ? ALT 49 (*)   ? GFR, Estimated  57 (*)   ? All other components within normal limits  ?URINE DRUG SCREEN, QUALITATIVE ([facility] ONLY) - Abnormal; Notable for the following components:  ? Cocaine Metabolite,Ur [place] POSITIVE (*)   ? Benzodiazepine, Ur [place] POSITIVE (*)   ? All other components within normal limits  ?RESP PANEL BY RT-PCR (FLU A&B, COVID) ARPGX2  ?BRAIN NATRIURETIC PEPTIDE  ?CBC WITH DIFFERENTIAL/PLATELET  ?TROPONIN I (HIGH SENSITIVITY)  ?TROPONIN I (HIGH SENSITIVITY)  ? ?EKG ?ED ECG REPORT ?I, Merwyn Katos, the attending physician, personally viewed and interpreted this ECG. ?Date: 04/18/2022 ?EKG Time: 0844 ?Rate: 85 ?Rhythm: normal sinus rhythm ?QRS Axis: normal ?Intervals: normal ?ST/T Wave abnormalities: normal ?Narrative Interpretation: no evidence of acute ischemia ?RADIOLOGY ?ED MD interpretation: One-view portable chest x-ray interpreted by me shows no evidence of acute abnormalities including no pneumonia, pneumothorax, or widened mediastinum ?-Agree with radiology assessment ?Official radiology report(s): ?No results found. ?PROCEDURES: ?Critical Care performed: No ?Procedures ?MEDICATIONS ORDERED IN ED: ?Medications  ?ondansetron (ZOFRAN-ODT) disintegrating tablet 4 mg (4 mg Oral Given 04/18/22 1655)  ? ?IMPRESSION / MDM / ASSESSMENT AND PLAN / ED COURSE  ?I reviewed the triage vital signs and the nursing notes. ?             ?               ? ?  Presents requesting resources for cocaine and alcohol detoxification ?Mildly slurred, sluggish behavior. ?Stated EtOH intoxication and recent crack cocaine use ?Airway maintained. ?Unlikely intracranial bleed, opioid intoxication or coingestion, sepsis, hypothyroidism. ?Suspect likely transient course of intoxication with expected  improvement of symptoms as patient metabolizes offending agent. ? ?Plan: frequent reassessments ? ?Reassessment Note: ?Patient seen by social work and required placement in an outpatient detoxification facility.  Patient stable for discharge to this facility at  this time ?Disposition: Discharge home ?  ?FINAL CLINICAL IMPRESSION(S) / ED DIAGNOSES  ? ?Final diagnoses:  ?Cocaine abuse (HCC)  ?Alcohol abuse  ? ?Rx / DC Orders  ? ?ED Discharge Orders   ? ? None  ? ?  ? ?Note:  This document was prepared using Dragon voice recognition software and may include unintentional dictation errors. ?  ?Merwyn Katos, MD ?04/19/22 1648 ? ?

## 2022-04-18 NOTE — TOC Initial Note (Signed)
Transition of Care (TOC) - Initial/Assessment Note  ? ? ?Patient Details  ?Name: Roy Mcewen [facility] Sr. ?MRN: [id number] ?Date of Birth: 01-29-1966 ? ?Transition of Care (TOC) CM/SW Contact:    ?Allayne Butcher, RN ?Phone Number: ?04/18/2022, 4:33 PM ? ?Clinical Narrative:                 ?RNCM provided patient with substance abuse resources for inpatient and outpatient services.  Patient requests to go for inpatient treatment over at [facility].  RNCM has called twice and left 2 messages with intake for return call to see how their admission process is and if they have availability.   ? ?Will follow up in the am.  Patient can leave any time if he chooses to.   ? ?  ?Barriers to Discharge: [facility]patient Resources ? ? ?Patient Goals and CMS Choice ?Patient states their goals for this hospitalization and ongoing recovery are:: Patient wants to go to [facility] ?  ?  ? ?Expected Discharge Plan and Services ?  ?  ?  ?  ?  ?                ?  ?  ?  ?  ?  ?  ?  ?  ?  ?  ? ?Prior Living Arrangements/Services ?  ?  ?  ?       ?  ?  ?  ?  ? ?Activities of Daily Living ?  ?  ? ?Permission Sought/Granted ?  ?  ?   ?   ?   ?   ? ?Emotional Assessment ?  ?  ?  ?  ?  ?  ? ?Admission diagnosis:  chest pain ?Patient Active Problem List  ? Diagnosis Date Noted  ? 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 Allergic rhinitis 06/21/2020  ? GERD (gastroesophageal reflux disease) 06/21/2020  ? 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  ? Arthritis pain, hip 05/18/2020  ? Status post total hip replacement, 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  ? Angioedema 07/24/2019  ? Chest pain 05/27/2018  ? ARF (acute renal failure) (HCC) 04/08/2018  ? Malingering 03/07/2017  ? Cocaine dependence (HCC) 02/19/2017  ? Substance induced mood disorder (HCC) 02/19/2017  ? Alcohol use disorder, severe, dependence (HCC) 12/27/2016  ? HIV disease (HCC) 10/26/2016  ? HTN (hypertension) 10/26/2016  ? Dyslipidemia 10/26/2016  ? BPH  (benign prostatic hyperplasia) 10/26/2016  ? Constipation 10/26/2016  ? Acquired hallux rigidus of right foot 07/16/2016  ? Arthritis 10/25/2014  ? Genital warts 08/25/2013  ? ?PCP:  System, Provider Not In ?Pharmacy:   ?[facility] - [place], [place] - [address] ?[address] ?[place] [address] ?Phone: [phone] Fax: [phone] ? ?[id number] - [place], [place] - [address] ?[address] ?[place] [address] ?Phone: [phone] Fax: [phone] ? ? ? ? ?Social Determinants of Health (SDOH) Interventions ?  ? ?Readmission Risk Interventions ?   ? View : No data to display.  ?  ?  ?  ? ? ? ?

## 2022-04-18 NOTE — ED Notes (Signed)
Patient used restroom and is back in the bed resting with monitor at this time. ?

## 2022-04-18 NOTE — ED Notes (Signed)
Hospital meal provided, pt tolerated w/o complaints.  Waste discarded appropriately. Drink was also given. ?

## 2022-04-19 NOTE — ED Notes (Addendum)
Pt stating he does not want to wait for transport anymore. Pt leaving at this time. Pt has already been presented with discharge papers and instructions.  ?

## 2022-04-19 NOTE — TOC Transition Note (Signed)
Transition of Care (TOC) - CM/SW Discharge Note ? ? ?Patient Details  ?Name: Roy [name] [facility] Sr. ?MRN: [id number] ?Date of Birth: 02/27/1966 ? ?Transition of Care (TOC) CM/SW Contact:  ?Allayne Butcher, RN ?Phone Number: ?04/19/2022, 11:36 AM ? ? ?Clinical Narrative:    ? ?Patient is discharging, RNCM called [employer] and they said that he can go and check in - they will do an assessment on arrival.  RNCM has arranged [facility] to pick patient up and transport him to facility.  Patient is in agreement with plan. ? ?  ?Barriers to Discharge: [facility] Outpatient Resources ? ? ?Patient Goals and CMS Choice ?Patient states their goals for this hospitalization and ongoing recovery are:: Patient wants to go to [facility] ?  ?  ? ?Discharge Placement ?  ?           ?  ?  ?  ?  ? ?Discharge Plan and Services ?  ?  ?           ?  ?  ?  ?  ?  ?  ?  ?  ?  ?  ? ?Social Determinants of Health (SDOH) Interventions ?  ? ? ?Readmission Risk Interventions ?   ? View : No data to display.  ?  ?  ?  ? ? ? ? ? ?

## 2022-04-19 NOTE — ED Notes (Signed)
VS ASSESS. NO OTHER NEEDS FOUND AT THIS MOMENT. ?

## 2022-04-19 NOTE — ED Notes (Signed)
VOL/pending TOC follow-up in the AM. ?

## 2022-04-19 NOTE — ED Notes (Signed)
[facility] on the way to provide transportation for detox center  ?

## 2022-04-19 NOTE — ED Notes (Signed)
Dietary called to send breakfast tray  °

## 2022-04-19 NOTE — ED Notes (Signed)
Hospital meal provided, pt tolerated w/o complaints.  Waste discarded appropriately.  

## 2022-05-09 ENCOUNTER — Ambulatory Visit: Admit: 2022-05-09 | Payer: MEDICAID

## 2022-05-10 ENCOUNTER — Other Ambulatory Visit: Payer: Self-pay

## 2022-05-10 ENCOUNTER — Emergency Department
Admission: EM | Admit: 2022-05-10 | Discharge: 2022-05-11 | Disposition: A | Discharge status: Rehabilitation Facility | Payer: No Typology Code available for payment source | Attending: Student in an Organized Health Care Education/Training Program | Admitting: Student in an Organized Health Care Education/Training Program

## 2022-05-10 ENCOUNTER — Emergency Department: Payer: No Typology Code available for payment source

## 2022-05-10 DIAGNOSIS — F1410 Cocaine abuse, uncomplicated: Secondary | ICD-10-CM | POA: Diagnosis not present

## 2022-05-10 DIAGNOSIS — F1010 Alcohol abuse, uncomplicated: Secondary | ICD-10-CM | POA: Diagnosis not present

## 2022-05-10 DIAGNOSIS — F1910 Other psychoactive substance abuse, uncomplicated: Secondary | ICD-10-CM | POA: Insufficient documentation

## 2022-05-10 DIAGNOSIS — Y900 Blood alcohol level of less than 20 mg/100 ml: Secondary | ICD-10-CM | POA: Diagnosis not present

## 2022-05-10 DIAGNOSIS — Z20822 Contact with and (suspected) exposure to covid-19: Secondary | ICD-10-CM | POA: Insufficient documentation

## 2022-05-10 LAB — CBC
HCT: 41.1 % (ref 39.0–52.0)
Hemoglobin: 13.7 g/dL (ref 13.0–17.0)
MCH: 33 pg (ref 26.0–34.0)
MCHC: 33.3 g/dL (ref 30.0–36.0)
MCV: 99 fL (ref 80.0–100.0)
Platelets: 199 10*3/uL (ref 150–400)
RBC: 4.15 MIL/uL — ABNORMAL LOW (ref 4.22–5.81)
RDW: 13.4 % (ref 11.5–15.5)
WBC: 3.8 10*3/uL — ABNORMAL LOW (ref 4.0–10.5)
nRBC: 0 % (ref 0.0–0.2)

## 2022-05-10 LAB — COMPREHENSIVE METABOLIC PANEL
ALT: 16 U/L (ref 0–44)
AST: 18 U/L (ref 15–41)
Albumin: 3.7 g/dL (ref 3.5–5.0)
Alkaline Phosphatase: 62 U/L (ref 38–126)
Anion gap: 6 (ref 5–15)
BUN: 19 mg/dL (ref 6–20)
CO2: 24 mmol/L (ref 22–32)
Calcium: 9 mg/dL (ref 8.9–10.3)
Chloride: 107 mmol/L (ref 98–111)
Creatinine, Ser: 1.27 mg/dL — ABNORMAL HIGH (ref 0.61–1.24)
GFR, Estimated: >60 mL/min (ref >60)
Glucose, Bld: 80 mg/dL (ref 70–99)
Potassium: 3.8 mmol/L (ref 3.5–5.1)
Sodium: 137 mmol/L (ref 135–145)
Total Bilirubin: 0.5 mg/dL (ref 0.3–1.2)
Total Protein: 6.9 g/dL (ref 6.5–8.1)

## 2022-05-10 LAB — TROPONIN I (HIGH SENSITIVITY): Troponin I (High Sensitivity): 7 ng/L (ref <18)

## 2022-05-10 LAB — ETHANOL: Alcohol, Ethyl (B): <10 mg/dL (ref <10)

## 2022-05-10 MED ORDER — THIAMINE HCL 100 MG PO TABS
100.0000 mg | ORAL_TABLET | Freq: Every day | ORAL | Status: DC
  Administered 2022-05-11: 100 mg via ORAL
  Filled 2022-05-10: qty 1

## 2022-05-10 MED ORDER — LORAZEPAM 2 MG PO TABS: 0.0000 mg | ORAL_TABLET | Freq: Two times a day (BID) | ORAL | Status: DC

## 2022-05-10 MED ORDER — LORAZEPAM 2 MG/ML IJ SOLN: 0.0000 mg | Freq: Four times a day (QID) | INTRAMUSCULAR | Status: DC

## 2022-05-10 MED ORDER — LORAZEPAM 2 MG PO TABS
0.0000 mg | ORAL_TABLET | Freq: Four times a day (QID) | ORAL | Status: DC
  Administered 2022-05-11: 1 mg via ORAL
  Filled 2022-05-10: qty 1

## 2022-05-10 MED ORDER — LORAZEPAM 2 MG/ML IJ SOLN: 0.0000 mg | Freq: Two times a day (BID) | INTRAMUSCULAR | Status: DC

## 2022-05-10 MED ORDER — THIAMINE HCL 100 MG/ML IJ SOLN: 100.0000 mg | Freq: Every day | INTRAMUSCULAR | Status: DC

## 2022-05-10 NOTE — ED Triage Notes (Signed)
Patient to ER via POV with complaints of drug and alcohol addiction. Reports daily alcohol usage, states he drinks all day- approx 2 cases of beer daily (48 beers). Last drink today. Also reports crack cocaine problem, last use today. Patient states he wants help finding a rehab- reports he has been to [facility] in [place] and wants to go back.   Patient also endorses left sided, non-radiating chest pain. States this started 2 hours ago. Reports some associated shortness of breath, worse with exertion.

## 2022-05-10 NOTE — ED Provider Notes (Signed)
[facility] Provider Note    Event Date/Time   First MD Initiated Contact with Patient 05/10/22 1959     (approximate)   History   Addiction Problem   HPI  Roy Stogdill Sr. is a 56 y.o. male with extensive history of polysubstance abuse alcohol and meth use presents to the ER requesting detox from alcohol and meth.  States he did have an episode of chest pain prior to arrival after using meth denies any pain or discomfort at this time.  States he did drink today.  Not having any SI or HI.  He is hoping to be taken to [facility] states that he called this morning but was told that they did not have any availability that week for he came to the ER for safe place to stay     Physical Exam   Triage Vital Signs: ED Triage Vitals  Enc Vitals Group     BP 05/10/22 1844 (!) 158/95     Pulse Rate 05/10/22 1844 (!) 57     Resp 05/10/22 1844 18     Temp 05/10/22 1844 98.5 F (36.9 C)     Temp Source 05/10/22 1844 Oral     SpO2 05/10/22 1844 99 %     Weight --      Height 05/10/22 1831 5\' 9"  (1.753 m)     Head Circumference --      Peak Flow --      Pain Score 05/10/22 1831 0     Pain Loc --      Pain Edu? --      Excl. in GC? --     Most recent vital signs: Vitals:   05/10/22 2130 05/10/22 2200  BP: 122/68 (!) 145/95  Pulse: (!) 58 (!) 53  Resp:  17  Temp:    SpO2: 97% 97%     Constitutional: Alert  Eyes: Conjunctivae are normal.  Head: Atraumatic. Nose: No congestion/rhinnorhea. Mouth/Throat: Mucous membranes are moist.   Neck: Painless ROM.  Cardiovascular:   Good peripheral circulation. Respiratory: Normal respiratory effort.  No retractions.  Gastrointestinal: Soft and nontender.  Musculoskeletal:  no deformity Neurologic:  MAE spontaneously. No gross focal neurologic deficits are appreciated.  Skin:  Skin is warm, dry and intact. No rash noted. Psychiatric: Mood and affect are normal. Speech and behavior are  normal.    ED Results / Procedures / Treatments   Labs (all labs ordered are listed, but only abnormal results are displayed) Labs Reviewed  COMPREHENSIVE METABOLIC PANEL - Abnormal; Notable for the following components:      Result Value   Creatinine, Ser 1.27 (*)    All other components within normal limits  CBC - Abnormal; Notable for the following components:   WBC 3.8 (*)    RBC 4.15 (*)    All other components within normal limits  RESP PANEL BY RT-PCR (FLU A&B, COVID) ARPGX2  ETHANOL  URINE DRUG SCREEN, QUALITATIVE ([facility] ONLY)  TROPONIN I (HIGH SENSITIVITY)  TROPONIN I (HIGH SENSITIVITY)     EKG  ED ECG REPORT I, 05/12/22, the attending physician, personally viewed and interpreted this ECG.   Date: 05/10/2022  EKG Time: 18:36  Rate: 65  Rhythm: sinus  Axis: normal  Intervals: normal qt  ST&T Change: nonspecific st and t wave abn    RADIOLOGY Please see ED Course for my review and interpretation.  I personally reviewed all radiographic images ordered to evaluate for the above  acute complaints and reviewed radiology reports and findings.  These findings were personally discussed with the patient.  Please see medical record for radiology report.    PROCEDURES:  Critical Care performed: No  Procedures   MEDICATIONS ORDERED IN ED: Medications  LORazepam (ATIVAN) injection 0-4 mg (has no administration in time range)    Or  LORazepam (ATIVAN) tablet 0-4 mg (has no administration in time range)  LORazepam (ATIVAN) injection 0-4 mg (has no administration in time range)    Or  LORazepam (ATIVAN) tablet 0-4 mg (has no administration in time range)  thiamine tablet 100 mg (has no administration in time range)    Or  thiamine (B-1) injection 100 mg (has no administration in time range)     IMPRESSION / MDM / ASSESSMENT AND PLAN / ED COURSE  I reviewed the triage vital signs and the nursing notes.                              Differential  diagnosis includes, but is not limited to, polysubstance abuse, withdrawal, electrolyte abnormality, homelessness, ACS, aspiration, bronchitis, pericarditis  Patient presented to the ER for evaluation of symptoms as described above. This presenting complaint could reflect a potentially life-threatening illness therefore the patient will be placed on continuous pulse oximetry and telemetry for monitoring.  Laboratory evaluation will be sent to evaluate for the above complaints.   Chest pain is resolved at this time likely secondary to his meth use.  Troponin negative.  EKG nonischemic.  Not consistent with acs. Blood work otherwise at his baseline.  Patient requesting help with detox.  Will consult TTS.      [name] presentation is most consistent with acute presentation with potential threat to life or bodily function.   FINAL CLINICAL IMPRESSION(S) / ED DIAGNOSES   Final diagnoses:  Polysubstance abuse (HCC)     Rx / DC Orders   ED Discharge Orders     None        Note:  This document was prepared using Dragon voice recognition software and may include unintentional dictation errors.    Willy Eddy, MD 05/10/22 [phone]

## 2022-05-10 NOTE — ED Notes (Signed)
Patient in bed with warm blanket, eating sandwich tray, and watching tv.

## 2022-05-11 LAB — RESP PANEL BY RT-PCR (FLU A&B, COVID) ARPGX2
Influenza A by PCR: NEGATIVE
Influenza B by PCR: NEGATIVE
SARS Coronavirus 2 by RT PCR: NEGATIVE

## 2022-05-11 NOTE — ED Notes (Signed)
Called [facility] for transport to [facility] spoke to [employer]  [phone]

## 2022-05-11 NOTE — BH Assessment (Signed)
Comprehensive Clinical Assessment (CCA) Note  05/11/2022 Roy Venier Dubois Sr. [id number]  Chief Complaint: Patient is a 56 year old male presenting to [facility] ED voluntarily requesting detox. Per triage note Patient to ER via POV with complaints of drug and alcohol addiction. Reports daily alcohol usage, states he drinks all day- approx 2 cases of beer daily (48 beers). Last drink today. Also reports crack cocaine problem, last use today. Patient states he wants help finding a rehab- reports he has been to [facility] in [place] and wants to go back. During assessment patient appears alert and oriented x4, calm and cooperative. Patient reports that he drinks "2 cases of beer" daily. Patient reports that he has been drinking excessively for a year and his age of first use being 55. Patient reports withdrawal symptoms of "nausea and sweats." Patient also reports using cocaine daily, he reports his last use being 05/10/22. Patient denies SI/HI/AH/VH. Chief Complaint  Patient presents with   Addiction Problem   Visit Diagnosis: Alcohol use disorder, severe. Cocaine use disorder severe     CCA Screening, Triage and Referral (STR)  Patient Reported Information How did you hear about [place]? Self  Referral name: No data recorded Referral phone number: No data recorded  Whom do you see for routine medical problems? No data recorded Practice/Facility Name: No data recorded Practice/Facility Phone Number: No data recorded Name of Contact: No data recorded Contact Number: No data recorded Contact Fax Number: No data recorded Prescriber Name: No data recorded Prescriber Address (if known): No data recorded  What Is the Reason for Your Visit/Call Today? Patient presents voluntarily requesting detox  How Long Has This Been Causing You Problems? > than 6 months  What Do You Feel Would Help You the Most Today? Alcohol or Drug Use Treatment   Have You Recently Been in Any Inpatient Treatment  (Hospital/Detox/Crisis Center/28-Day Program)? No data recorded Name/Location of Program/Hospital:No data recorded How Long Were You There? No data recorded When Were You Discharged? No data recorded  Have You Ever Received Services From [facility] Before? No data recorded Who Do You See at [facility]? No data recorded  Have You Recently Had Any Thoughts About Hurting Yourself? No  Are You Planning to Commit Suicide/Harm Yourself At This time? No   Have you Recently Had Thoughts About [place]? No  Explanation: No data recorded  Have You Used Any Alcohol or Drugs in the Past 24 Hours? Yes  How Long Ago Did You Use Drugs or Alcohol? No data recorded What Did You Use and How Much? Alcohol, Cocaine   Do You Currently Have a Therapist/Psychiatrist? No  Name of Therapist/Psychiatrist: No data recorded  Have You Been Recently Discharged From Any Office Practice or Programs? No  Explanation of Discharge From Practice/Program: No data recorded    CCA Screening Triage Referral Assessment Type of Contact: Face-to-Face  Is this Initial or Reassessment? No data recorded Date Telepsych consult ordered in [facility]:  No data recorded Time Telepsych consult ordered in [facility]:  No data recorded  Patient Reported Information Reviewed? No data recorded Patient Left Without Being Seen? No data recorded Reason for Not Completing Assessment: No data recorded  Collateral Involvement: None provided   Does Patient Have a Shannon? No data recorded Name and Contact of Legal Guardian: No data recorded If Minor and Not Living with Parent(s), Who has Custody? n/a  Is CPS involved or ever been involved? Never  Is APS involved or  ever been involved? Never   Patient Determined To Be At Risk for Harm To Self or Others Based on Review of Patient Reported Information or Presenting Complaint? No  Method: No data recorded Availability of Means: No data  recorded Intent: No data recorded Notification Required: No data recorded Additional Information for Danger to Others Potential: No data recorded Additional Comments for Danger to Others Potential: No data recorded Are There Guns or Other Weapons in Your Home? No data recorded Types of Guns/Weapons: No data recorded Are These Weapons Safely Secured?                            No data recorded Who Could Verify You Are Able To Have These Secured: No data recorded Do You Have any Outstanding Charges, Pending Court Dates, Parole/Probation? No data recorded Contacted To Inform of Risk of Harm To Self or Others: No data recorded  Location of Assessment: [facility] ED   Does Patient Present under Involuntary Commitment? No  IVC Papers Initial File Date: No data recorded  [place] of Residence:    Patient Currently Receiving the Following Services: Medication Management   Determination of Need: Emergent (2 hours)   Options For Referral: ED Visit; Medication Management     CCA Biopsychosocial Intake/Chief Complaint:  No data recorded Current Symptoms/Problems: No data recorded  Patient Reported Schizophrenia/Schizoaffective Diagnosis in Past: No   Strengths: Patient is able to communicate his needs  Preferences: No data recorded Abilities: No data recorded  Type of Services Patient Feels are Needed: No data recorded  Initial Clinical Notes/Concerns: No data recorded  Mental Health Symptoms Depression:   None   Duration of Depressive symptoms: No data recorded  Mania:   None   Anxiety:    None   Psychosis:   None   Duration of Psychotic symptoms: No data recorded  Trauma:   None   Obsessions:   None   Compulsions:   None   Inattention:   None   Hyperactivity/Impulsivity:   None   Oppositional/Defiant Behaviors:   None   Emotional Irregularity:   None   Other Mood/Personality Symptoms:  No data recorded   Mental Status Exam Appearance and  self-care  Stature:   Average   Weight:   Average weight   Clothing:   Casual   Grooming:   Normal   Cosmetic use:   None   Posture/gait:   Normal   Motor activity:   Not Remarkable   Sensorium  Attention:   Normal   Concentration:   Normal   Orientation:   X5   Recall/memory:   Normal   Affect and Mood  Affect:   Appropriate   Mood:   Anxious   Relating  Eye contact:   Normal   Facial expression:   Responsive   Attitude toward examiner:   Cooperative   Thought and Language  Speech flow:  Clear and Coherent   Thought content:   Appropriate to Mood and Circumstances   Preoccupation:   None   Hallucinations:   None   Organization:  No data recorded  [employer] of Knowledge:   Fair   Intelligence:   Average   Abstraction:   Normal   Judgement:   Good   Reality Testing:   Realistic   Insight:   Good   Decision Making:   Normal   Social Functioning  Social Maturity:   Responsible   Social  Judgement:   Normal   Stress  Stressors:   Housing; Teacher, music Ability:   Normal   Skill Deficits:   None   Supports:   Support needed     Religion: Religion/Spirituality Are You A Religious Person?: No  Leisure/Recreation: Leisure / Recreation Do You Have Hobbies?: No  Exercise/Diet: Exercise/Diet Do You Exercise?: No Have You Gained or Lost A Significant Amount of Weight in the Past Six Months?: No Do You Follow a Special Diet?: No Do You Have Any Trouble Sleeping?: No   CCA Employment/Education Employment/Work Situation: Employment / Work Technical sales engineer: On disability Why is Patient on Disability: Unknown How Long has Patient Been on Disability: Unknown [name] Job has Been Impacted by Current Illness: No Has Patient ever Been in the [employer]?: No  Education: Education Is Patient Currently Attending School?: No Did Physicist, medical?: No Did You Have An  Individualized Education Program (IIEP): No Did You Have Any Difficulty At School?: No [name] Education Has Been Impacted by Current Illness: No   CCA Family/Childhood History Family and Relationship History: Family history Marital status: Single Does patient have children?: No  Childhood History:  Childhood History Did patient suffer any verbal/emotional/physical/sexual abuse as a child?: No Did patient suffer from severe childhood neglect?: No Has patient ever been sexually abused/assaulted/raped as an adolescent or adult?: No Was the patient ever a victim of a crime or a disaster?: No Witnessed domestic violence?: No Has patient been affected by domestic violence as an adult?: No  Child/Adolescent Assessment:     CCA Substance Use Alcohol/Drug Use: Alcohol / Drug Use Pain Medications: See MAR Prescriptions: See MAR Over the Counter: See MAR History of alcohol / drug use?: Yes Substance #1 Name of Substance 1: Alcohol 1 - Age of First Use: 9 1 - Amount (size/oz): "2 packs of beer" 1 - Frequency: daily 1 - Last Use / Amount: 05/10/22 1- Route of Use: Oral Substance #2 Name of Substance 2: Cocaine 2 - Amount (size/oz): "$100's worth" 2 - Frequency: daily 2 - Last Use / Amount: 05/10/22                     ASAM's:  Six Dimensions of Multidimensional Assessment  Dimension 1:  Acute Intoxication and/or Withdrawal Potential:      Dimension 2:  Biomedical Conditions and Complications:      Dimension 3:  Emotional, Behavioral, or Cognitive Conditions and Complications:     Dimension 4:  Readiness to Change:     Dimension 5:  Relapse, Continued use, or Continued Problem Potential:     Dimension 6:  Recovery/Living Environment:     ASAM Severity Score:    ASAM Recommended Level of Treatment: ASAM Recommended Level of Treatment: Level III Residential Treatment   Substance use Disorder (SUD) Substance Use Disorder (SUD)  Checklist Symptoms of Substance  Use: Continued use despite having a persistent/recurrent physical/psychological problem caused/exacerbated by use, Evidence of tolerance, Evidence of withdrawal (Comment), Presence of craving or strong urge to use, Social, occupational, recreational activities given up or reduced due to use, Recurrent use that results in a failure to fulfill major role obligations (work, school, home), Large amounts of time spent to obtain, use or recover from the substance(s), Continued use despite persistent or recurrent social, interpersonal problems, caused or exacerbated by use, Persistent desire or unsuccessful efforts to cut down or control use, Repeated use in physically hazardous situations, Substance(s) often taken in larger amounts or over  longer times than was intended  Recommendations for Services/Supports/Treatments: Recommendations for Services/Supports/Treatments Recommendations For Services/Supports/Treatments: Detox  DSM5 Diagnoses: Patient Active Problem List   Diagnosis Date Noted   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   Allergic rhinitis 06/21/2020   GERD (gastroesophageal reflux disease) 06/21/2020   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   Arthritis pain, hip 05/18/2020   Status post total hip replacement, 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   Angioedema 07/24/2019   Chest pain 05/27/2018   ARF (acute renal failure) ([place]) 04/08/2018   Malingering 03/07/2017   Cocaine dependence ([place]) 02/19/2017   Substance induced mood disorder ([place]) 02/19/2017   Alcohol use disorder, severe, dependence ([place]) 12/27/2016   HIV disease ([place]) 10/26/2016   HTN (hypertension) 10/26/2016   Dyslipidemia 10/26/2016   BPH (benign prostatic hyperplasia) 10/26/2016   Constipation 10/26/2016   Acquired hallux rigidus of right foot 07/16/2016   Arthritis 10/25/2014   Genital warts 08/25/2013    Patient Centered Plan: Patient is on the following Treatment Plan(s):  Substance Abuse   Referrals to Alternative  Service(s): Referred to Alternative Service(s):   Place:   Date:   Time:    Referred to Alternative Service(s):   Place:   Date:   Time:    Referred to Alternative Service(s):   Place:   Date:   Time:    Referred to Alternative Service(s):   Place:   Date:   Time:      @BHCOLLABOFCARE @  [employer], LCAS-A

## 2022-05-11 NOTE — BH Assessment (Signed)
Referral information for Detox treatment faxed to;   [facility] [phone]) Staff reports no beds available tonight and to check back in the morning  RTS reports facility is full tonight and tomorrow 05/11/22

## 2022-05-11 NOTE — ED Notes (Signed)
Pt unable to participated in CIWA assessment at this time. Pt continues to sleep comfortably. Pending Ativan Po placed on hold for later evaluation.

## 2022-05-11 NOTE — BH Assessment (Signed)
Spoke with [facility] Harry, [phone] option 4), they have a bed for the patient.  __________________________________ [facility] [address],  [place], [place] [address] [phone]

## 2022-05-11 NOTE — ED Notes (Signed)
Report received by previous RN. Pt currently sleeping under his blankets. Bed side rail up.

## 2022-05-11 NOTE — ED Notes (Signed)
Pt given meal tray and ice cream

## 2022-05-17 ENCOUNTER — Ambulatory Visit
Admit: 2022-05-17 | Discharge: 2022-05-17 | Disposition: A | Discharge status: Home / Self Care | Payer: MEDICAID | Attending: Emergency Medicine

## 2022-05-17 ENCOUNTER — Emergency Department
Admit: 2022-05-17 | Discharge: 2022-05-17 | Disposition: A | Discharge status: Home / Self Care | Payer: MEDICAID | Attending: Emergency Medicine

## 2022-05-17 DIAGNOSIS — R079 Chest pain, unspecified: Principal | ICD-10-CM

## 2022-05-17 DIAGNOSIS — R1013 Epigastric pain: Principal | ICD-10-CM

## 2022-05-17 MED ORDER — PEG 3350-ELECTROLYTES 236 GRAM-22.74 GRAM-6.74 GRAM-5.86 GRAM SOLUTION
Freq: Once | ORAL | 0 refills | 1 days | Status: CP
Start: 2022-05-17 — End: 2022-05-17

## 2022-05-17 MED ORDER — LOSARTAN 100 MG TABLET
ORAL_TABLET | Freq: Every day | ORAL | 0 refills | 30 days | Status: CP
Start: 2022-05-17 — End: 2022-06-16

## 2022-05-17 MED ORDER — PANTOPRAZOLE 20 MG TABLET,DELAYED RELEASE
ORAL_TABLET | Freq: Every day | ORAL | 0 refills | 20 days | Status: CP
Start: 2022-05-17 — End: 2022-06-06

## 2022-05-21 ENCOUNTER — Emergency Department: Payer: Medicaid Other

## 2022-05-21 ENCOUNTER — Emergency Department
Admission: EM | Admit: 2022-05-21 | Discharge: 2022-05-22 | Disposition: A | Discharge status: Home / Self Care | Payer: Medicaid Other | Attending: Emergency Medicine | Admitting: Emergency Medicine

## 2022-05-21 ENCOUNTER — Other Ambulatory Visit: Payer: Self-pay

## 2022-05-21 ENCOUNTER — Encounter: Payer: Self-pay | Admitting: Emergency Medicine

## 2022-05-21 DIAGNOSIS — F1494 Cocaine use, unspecified with cocaine-induced mood disorder: Secondary | ICD-10-CM

## 2022-05-21 DIAGNOSIS — R630 Anorexia: Secondary | ICD-10-CM | POA: Diagnosis not present

## 2022-05-21 DIAGNOSIS — Z20822 Contact with and (suspected) exposure to covid-19: Secondary | ICD-10-CM | POA: Insufficient documentation

## 2022-05-21 DIAGNOSIS — R45851 Suicidal ideations: Secondary | ICD-10-CM | POA: Diagnosis not present

## 2022-05-21 DIAGNOSIS — R7989 Other specified abnormal findings of blood chemistry: Secondary | ICD-10-CM | POA: Diagnosis not present

## 2022-05-21 DIAGNOSIS — F1410 Cocaine abuse, uncomplicated: Secondary | ICD-10-CM | POA: Diagnosis not present

## 2022-05-21 DIAGNOSIS — R079 Chest pain, unspecified: Secondary | ICD-10-CM | POA: Diagnosis present

## 2022-05-21 DIAGNOSIS — I2510 Atherosclerotic heart disease of native coronary artery without angina pectoris: Secondary | ICD-10-CM | POA: Insufficient documentation

## 2022-05-21 DIAGNOSIS — F1420 Cocaine dependence, uncomplicated: Secondary | ICD-10-CM | POA: Diagnosis not present

## 2022-05-21 LAB — CBC
HCT: 46.1 % (ref 39.0–52.0)
Hemoglobin: 15.5 g/dL (ref 13.0–17.0)
MCH: 32.4 pg (ref 26.0–34.0)
MCHC: 33.6 g/dL (ref 30.0–36.0)
MCV: 96.2 fL (ref 80.0–100.0)
Platelets: 194 10*3/uL (ref 150–400)
RBC: 4.79 MIL/uL (ref 4.22–5.81)
RDW: 13.2 % (ref 11.5–15.5)
WBC: 4 10*3/uL (ref 4.0–10.5)
nRBC: 0 % (ref 0.0–0.2)

## 2022-05-21 LAB — BASIC METABOLIC PANEL
Anion gap: 9 (ref 5–15)
BUN: 37 mg/dL — ABNORMAL HIGH (ref 6–20)
CO2: 22 mmol/L (ref 22–32)
Calcium: 9.3 mg/dL (ref 8.9–10.3)
Chloride: 106 mmol/L (ref 98–111)
Creatinine, Ser: 1.59 mg/dL — ABNORMAL HIGH (ref 0.61–1.24)
GFR, Estimated: 50 mL/min — ABNORMAL LOW (ref >60)
Glucose, Bld: 131 mg/dL — ABNORMAL HIGH (ref 70–99)
Potassium: 3.6 mmol/L (ref 3.5–5.1)
Sodium: 137 mmol/L (ref 135–145)

## 2022-05-21 LAB — URINE DRUG SCREEN, QUALITATIVE (ARMC ONLY)
Amphetamines, Ur Screen: NOT DETECTED
Barbiturates, Ur Screen: NOT DETECTED
Benzodiazepine, Ur Scrn: NOT DETECTED
Cannabinoid 50 Ng, Ur ~~LOC~~: NOT DETECTED
Cocaine Metabolite,Ur ~~LOC~~: POSITIVE — AB
MDMA (Ecstasy)Ur Screen: NOT DETECTED
Methadone Scn, Ur: NOT DETECTED
Opiate, Ur Screen: NOT DETECTED
Phencyclidine (PCP) Ur S: NOT DETECTED
Tricyclic, Ur Screen: NOT DETECTED

## 2022-05-21 LAB — CK: Total CK: 447 U/L — ABNORMAL HIGH (ref 49–397)

## 2022-05-21 LAB — RESP PANEL BY RT-PCR (FLU A&B, COVID) ARPGX2
Influenza A by PCR: NEGATIVE
Influenza B by PCR: NEGATIVE
SARS Coronavirus 2 by RT PCR: NEGATIVE

## 2022-05-21 LAB — ACETAMINOPHEN LEVEL: Acetaminophen (Tylenol), Serum: <10 ug/mL — ABNORMAL LOW (ref 10–30)

## 2022-05-21 LAB — ETHANOL: Alcohol, Ethyl (B): <10 mg/dL (ref <10)

## 2022-05-21 LAB — TROPONIN I (HIGH SENSITIVITY)
Troponin I (High Sensitivity): 14 ng/L (ref <18)
Troponin I (High Sensitivity): 14 ng/L (ref <18)

## 2022-05-21 LAB — SALICYLATE LEVEL: Salicylate Lvl: <7 mg/dL — ABNORMAL LOW (ref 7.0–30.0)

## 2022-05-21 MED ORDER — LORAZEPAM 2 MG/ML IJ SOLN
0.5000 mg | Freq: Once | INTRAMUSCULAR | Status: AC
  Administered 2022-05-21: 0.5 mg via INTRAVENOUS
  Filled 2022-05-21: qty 1

## 2022-05-21 MED ORDER — BICTEGRAVIR-EMTRICITAB-TENOFOV 50-200-25 MG PO TABS
1.0000 | ORAL_TABLET | Freq: Every day | ORAL | Status: DC
  Administered 2022-05-22: 1 via ORAL
  Filled 2022-05-21 (×2): qty 1

## 2022-05-21 MED ORDER — SODIUM CHLORIDE 0.9 % IV BOLUS
1000.0000 mL | Freq: Once | INTRAVENOUS | Status: AC
  Administered 2022-05-21: 1000 mL via INTRAVENOUS

## 2022-05-21 NOTE — ED Notes (Signed)
Patient belongings: -hat -red shirt -denim shorts -white shoes -black socks -black belt -spare change -wallet -blue lighter -black bookbag

## 2022-05-21 NOTE — ED Provider Notes (Signed)
Patient signed out to me pending repeat trop and psych eval.   Pts repeat trop is negative. He was seen by psychiatry and cleared. He is being referred by TTS for detox. Home HIV meds ordered.    Georga Hacking, MD 05/21/22 [phone]

## 2022-05-21 NOTE — Consult Note (Cosign Needed Addendum)
[facility] Face-to-Face Psychiatry Consult   Reason for Consult: Substance abuse Referring Physician: EDP Patient Identification: Roy Liter Monsivais Sr. MRN:  [id number] Principal Diagnosis: Cocaine dependence (HCC) Diagnosis:  Principal Problem:   Cocaine dependence (HCC)   Total Time spent with patient: 45 minutes  Subjective: "I want rehab and to get away from where I live."  Roy Scripts Sr. is a 56 y.o. male patient admitted with cocaine abuse.  HPI: Patient is well known to this facility.  He has presented to the ED 4 times since April: 4/17, 5/3, 5/25, and today.  On one occasion he was accepted for bed at [facility] and he left the CD because he did not wait for the transport.  (2 days patient states he left because his granddaughter was ill.  Granddaughter does not live with him and granddaughter is in the care of her mother). On  5/25 patient was sent to [facility], where he states he stayed for 1 week.  On evaluation today, patient is irritable and a bit argumentative, stating that the hospital should be doing more to get him into "long term rehab, like 6 months."  Patient states that he lives in a neighborhood that is "full of drugs" and that he cannot stop using on his own and needs longer treatment.  Patient rejects any discussion regarding self-determination and taking responsibility for his actions.  Writer presented the role of this  emergency room and the restrictions we have on placement into a long-term substance use program.  Writer asked patient if he has done any investigation himself of rehab facilities.  He states that he has not and "that is your job."  Patient is UDS positive for cocaine only.  Patient denies suicidal or homicidal ideations.  He has been seen coherent, linear sentences.  Denies auditory or visual hallucinations.  Denies paranoia.  Patient does not meet criteria for inpatient psychiatric hospitalization. TTS, Renee referring patient to  substance use treatment centers.   Past Psychiatric History: Cocaine dependence  Risk to Self:   Risk to Others:   Prior Inpatient Therapy:   Prior Outpatient Therapy:    Past Medical History:  Past Medical History:  Diagnosis Date   AIDS (acquired immune deficiency syndrome) (HCC)    Arthritis    Asthma    Bipolar disorder (HCC)    Bronchitis    Complication of anesthesia    Coronary artery disease    Depression    Dysrhythmia    1st degree heart block/ brady   GERD (gastroesophageal reflux disease)    Hepatitis C    treated   HIV (human immunodeficiency virus infection) (HCC)    HTN (hypertension)    PONV (postoperative nausea and vomiting)     Past Surgical History:  Procedure Laterality Date   APPLICATION OF WOUND VAC Right 09/01/2019   Procedure: APPLICATION OF WOUND VAC;  Surgeon: Kennedy Bucker, MD;  Location: [facility] ORS;  Service: Orthopedics;  Laterality: Right;  Serial # [id number]   HERNIA REPAIR Left    inguinal   TOE SURGERY     TOE SURGERY Right    TOTAL HIP ARTHROPLASTY Right 09/01/2019   Procedure: TOTAL HIP ARTHROPLASTY ANTERIOR APPROACH;  Surgeon: Kennedy Bucker, MD;  Location: [facility] ORS;  Service: Orthopedics;  Laterality: Right;   Family History:  Family History  Problem Relation Age of Onset   Cancer Brother    Uterine cancer Mother    CAD Mother    Hypertension Mother  Hyperlipidemia Mother    Family Psychiatric  History: Unknown Social History:  Social History   Substance and Sexual Activity  Alcohol Use Not Currently   Alcohol/week: 84.0 standard drinks   Types: 84 Cans of beer per week   Comment: daily     Social History   Substance and Sexual Activity  Drug Use Yes   Types: Cocaine, "Crack" cocaine   Comment: 01/23/2021    Social History   Socioeconomic History   Marital status: Divorced    Spouse name: Not on file   Number of children: Not on file   Years of education: Not on file   Highest education level: Not on file   Occupational History   Not on file  Tobacco Use   Smoking status: Every Day    Packs/day: 0.25    Types: Cigarettes   Smokeless tobacco: Never  Vaping Use   Vaping Use: Never used  Substance and Sexual Activity   Alcohol use: Not Currently    Alcohol/week: 84.0 standard drinks    Types: 84 Cans of beer per week    Comment: daily   Drug use: Yes    Types: Cocaine, "Crack" cocaine    Comment: 01/23/2021   Sexual activity: Yes  Other Topics Concern   Not on file  Social History Narrative   Not on file   Social Determinants of Health   Financial Resource Strain: Not on file  Food Insecurity: Not on file  Transportation Needs: Not on file  Physical Activity: Not on file  Stress: Not on file  Social Connections: Not on file   Additional Social History:    Allergies:   Allergies  Allergen Reactions   Amlodipine Swelling    Of the tongue   Lisinopril Swelling   Lactose Other (See Comments)    GI distress   Pollen Extract Other (See Comments)    Itchy eyes and runny nose    Labs:  Results for orders placed or performed during the hospital encounter of 05/21/22 (from the past 48 hour(s))  Basic metabolic panel     Status: Abnormal   Collection Time: 05/21/22 12:46 PM  Result Value Ref Range   Sodium 137 135 - 145 mmol/L   Potassium 3.6 3.5 - 5.1 mmol/L   Chloride 106 98 - 111 mmol/L   CO2 22 22 - 32 mmol/L   Glucose, Bld 131 (H) 70 - 99 mg/dL    Comment: Glucose reference range applies only to samples taken after fasting for at least 8 hours.   BUN 37 (H) 6 - 20 mg/dL   Creatinine, Ser 1.49 (H) 0.61 - 1.24 mg/dL   Calcium 9.3 8.9 - 70.2 mg/dL   GFR, Estimated 50 (L) >60 mL/min    Comment: (NOTE) Calculated using the CKD-EPI Creatinine Equation (2021)    Anion gap 9 5 - 15    Comment: Performed at [facility], [address] [place], [place] [address]  CBC     Status: None   Collection Time: 05/21/22 12:46 PM  Result Value Ref Range   WBC 4.0  4.0 - 10.5 K/uL   RBC 4.79 4.22 - 5.81 MIL/uL   Hemoglobin 15.5 13.0 - 17.0 g/dL   HCT 88.5 02.7 - 74.1 %   MCV 96.2 80.0 - 100.0 fL   MCH 32.4 26.0 - 34.0 pg   MCHC 33.6 30.0 - 36.0 g/dL   RDW 28.7 86.7 - 67.2 %   Platelets 194 150 - 400 K/uL  nRBC 0.0 0.0 - 0.2 %    Comment: Performed at [facility], [address] [place], [place] [address]  Troponin I (High Sensitivity)     Status: None   Collection Time: 05/21/22 12:46 PM  Result Value Ref Range   Troponin I (High Sensitivity) 14 <18 ng/L    Comment: (NOTE) Elevated high sensitivity troponin I (hsTnI) values and significant  changes across serial measurements may suggest ACS but many other  chronic and acute conditions are known to elevate hsTnI results.  Refer to the "Links" section for chest pain algorithms and additional  guidance. Performed at [facility], [address] [place], [place] [address]   Ethanol     Status: None   Collection Time: 05/21/22 12:46 PM  Result Value Ref Range   Alcohol, Ethyl (B) <10 <10 mg/dL    Comment: (NOTE) Lowest detectable limit for serum alcohol is 10 mg/dL.  For medical purposes only. Performed at [facility], [address] [place], [place] [address]   Salicylate level     Status: Abnormal   Collection Time: 05/21/22 12:46 PM  Result Value Ref Range   Salicylate Lvl <7.0 (L) 7.0 - 30.0 mg/dL    Comment: Performed at [facility], [address] [place], [place] [address]  Acetaminophen level     Status: Abnormal   Collection Time: 05/21/22 12:46 PM  Result Value Ref Range   Acetaminophen (Tylenol), Serum <10 (L) 10 - 30 ug/mL    Comment: (NOTE) Therapeutic concentrations vary significantly. A range of 10-30 ug/mL  may be an effective concentration for many patients. However, some  are best treated at concentrations outside of this range. Acetaminophen concentrations >150 ug/mL at 4 hours after ingestion  and >50 ug/mL at 12  hours after ingestion are often associated with  toxic reactions.  Performed at [facility], [address] [place], [place] [address]   Urine Drug Screen, Qualitative     Status: Abnormal   Collection Time: 05/21/22 12:47 PM  Result Value Ref Range   Tricyclic, Ur Screen NONE DETECTED NONE DETECTED   Amphetamines, Ur Screen NONE DETECTED NONE DETECTED   MDMA (Ecstasy)Ur Screen NONE DETECTED NONE DETECTED   Cocaine Metabolite,Ur [place] POSITIVE (A) NONE DETECTED   Opiate, Ur Screen NONE DETECTED NONE DETECTED   Phencyclidine (PCP) Ur S NONE DETECTED NONE DETECTED   Cannabinoid 50 Ng, Ur [place] NONE DETECTED NONE DETECTED   Barbiturates, Ur Screen NONE DETECTED NONE DETECTED   Benzodiazepine, Ur Scrn NONE DETECTED NONE DETECTED   Methadone Scn, Ur NONE DETECTED NONE DETECTED    Comment: (NOTE) Tricyclics + metabolites, urine    Cutoff 1000 ng/mL Amphetamines + metabolites, urine  Cutoff 1000 ng/mL MDMA (Ecstasy), urine              Cutoff 500 ng/mL Cocaine Metabolite, urine          Cutoff 300 ng/mL Opiate + metabolites, urine        Cutoff 300 ng/mL Phencyclidine (PCP), urine         Cutoff 25 ng/mL Cannabinoid, urine                 Cutoff 50 ng/mL Barbiturates + metabolites, urine  Cutoff 200 ng/mL Benzodiazepine, urine              Cutoff 200 ng/mL Methadone, urine                   Cutoff  300 ng/mL  The urine drug screen provides only a preliminary, unconfirmed analytical test result and should not be used for non-medical purposes. Clinical consideration and professional judgment should be applied to any positive drug screen result due to possible interfering substances. A more specific alternate chemical method must be used in order to obtain a confirmed analytical result. Gas chromatography / mass spectrometry (GC/MS) is the preferred confirm atory method. Performed at [facility], [address] [place], [place] [address]   CK     Status: Abnormal    Collection Time: 05/21/22  1:37 PM  Result Value Ref Range   Total CK 447 (H) 49 - 397 U/L    Comment: Performed at [facility], [address] [place], [place] [address]  Troponin I (High Sensitivity)     Status: None   Collection Time: 05/21/22  1:37 PM  Result Value Ref Range   Troponin I (High Sensitivity) 14 <18 ng/L    Comment: (NOTE) Elevated high sensitivity troponin I (hsTnI) values and significant  changes across serial measurements may suggest ACS but many other  chronic and acute conditions are known to elevate hsTnI results.  Refer to the "Links" section for chest pain algorithms and additional  guidance. Performed at [facility], [address] [place], [place] [address]   Resp Panel by RT-PCR (Flu A&B, Covid) Anterior Nasal Swab     Status: None   Collection Time: 05/21/22  2:08 PM   Specimen: Anterior Nasal Swab  Result Value Ref Range   SARS Coronavirus 2 by RT PCR NEGATIVE NEGATIVE    Comment: (NOTE) SARS-CoV-2 target nucleic acids are NOT DETECTED.  The SARS-CoV-2 RNA is generally detectable in upper respiratory specimens during the acute phase of infection. The lowest concentration of SARS-CoV-2 viral copies this assay can detect is 138 copies/mL. A negative result does not preclude SARS-Cov-2 infection and should not be used as the sole basis for treatment or other patient management decisions. A negative result may occur with  improper specimen collection/handling, submission of specimen other than nasopharyngeal swab, presence of viral mutation(s) within the areas targeted by this assay, and inadequate number of viral copies(<138 copies/mL). A negative result must be combined with clinical observations, patient history, and epidemiological information. The expected result is Negative.  Fact Sheet for Patients:  BloggerCourse.com  Fact Sheet for Healthcare Providers:   SeriousBroker.it  This test is no t yet approved or cleared by the [place] FDA and  has been authorized for detection and/or diagnosis of SARS-CoV-2 by FDA under an Emergency Use Authorization (EUA). This EUA will remain  in effect (meaning this test can be used) for the duration of the COVID-19 declaration under Section 564(b)(1) of the Act, 21 U.S.C.section 360bbb-3(b)(1), unless the authorization is terminated  or revoked sooner.       Influenza A by PCR NEGATIVE NEGATIVE   Influenza B by PCR NEGATIVE NEGATIVE    Comment: (NOTE) The Xpert Xpress SARS-CoV-2/FLU/RSV plus assay is intended as an aid in the diagnosis of influenza from Nasopharyngeal swab specimens and should not be used as a sole basis for treatment. Nasal washings and aspirates are unacceptable for Xpert Xpress SARS-CoV-2/FLU/RSV testing.  Fact Sheet for Patients: BloggerCourse.com  Fact Sheet for Healthcare Providers: SeriousBroker.it  This test is not yet approved or cleared by the [place] FDA and has been authorized for detection and/or diagnosis of SARS-CoV-2 by FDA under an Emergency Use Authorization (EUA). This EUA will remain in  effect (meaning this test can be used) for the duration of the COVID-19 declaration under Section 564(b)(1) of the Act, 21 U.S.C. section 360bbb-3(b)(1), unless the authorization is terminated or revoked.  Performed at [facility], [address] [place], [place] [address]     No current facility-administered medications for this encounter.   Current Outpatient Medications  Medication Sig Dispense Refill   albuterol (VENTOLIN HFA) 108 (90 Base) MCG/ACT inhaler Inhale 1-2 puffs into the lungs every 4 (four) hours as needed for wheezing or shortness of breath. (Patient not taking: Reported on 03/19/2022) 18 g 1   citalopram (CELEXA) 40 MG tablet Take 1 tablet (40 mg total) by  mouth daily. (Patient not taking: Reported on 03/19/2022) 30 tablet 1   hydrOXYzine (ATARAX/VISTARIL) 50 MG tablet Take 1 tablet (50 mg total) by mouth 3 (three) times daily as needed. (Patient not taking: Reported on 03/19/2022) 30 tablet 0   mirtazapine (REMERON) 7.5 MG tablet Take 1 tablet (7.5 mg total) by mouth at bedtime. (Patient not taking: Reported on 03/19/2022) 30 tablet 1   risperiDONE (RISPERDAL) 1 MG tablet Take 1 tablet (1 mg total) by mouth at bedtime. (Patient not taking: Reported on 03/19/2022) 30 tablet 1   traZODone (DESYREL) 150 MG tablet Take 1 tablet (150 mg total) by mouth at bedtime as needed for sleep. (Patient not taking: Reported on 03/19/2022) 30 tablet 1    Musculoskeletal: Strength & Muscle Tone: within normal limits Gait & Station: normal Patient leans: N/A   Psychiatric Specialty Exam:  Presentation  General Appearance: Appropriate for Environment  Eye Contact:Good  Speech:Clear and Coherent  Speech Volume:Normal  Handedness:Right   Mood and Affect  Mood:Irritable  Affect:Blunt   Thought Process  Thought Processes:Coherent  Descriptions of Associations:Intact  Orientation:Full (Time, Place and Person)  Thought Content:WDL  History of Schizophrenia/Schizoaffective disorder:No  Duration of Psychotic Symptoms:No data recorded Hallucinations:Hallucinations: None  Ideas of Reference:None  Suicidal Thoughts:Suicidal Thoughts: No  Homicidal Thoughts:Homicidal Thoughts: No   Sensorium  Memory:Immediate Good  Judgment:Poor  Insight:Poor   Executive Functions  Concentration:Good  Attention Span:Good  Recall:Good  Fund of Knowledge:Fair  Language:Fair   Psychomotor Activity  Psychomotor Activity:Psychomotor Activity: Normal  Assets  Assets:Communication Skills; Housing; Health and safety inspector; Resilience; Social Support   Sleep  Sleep:Sleep: Good  Physical Exam: Physical Exam Vitals and nursing note reviewed.   HENT:     Head: Normocephalic.     Nose: No congestion or rhinorrhea.  Eyes:     General:        Right eye: No discharge.        Left eye: No discharge.  Cardiovascular:     Rate and Rhythm: Normal rate.  Pulmonary:     Effort: Pulmonary effort is normal.  Musculoskeletal:        General: Normal range of motion.     Cervical back: Normal range of motion.  Skin:    General: Skin is dry.  Neurological:     Mental Status: He is alert and oriented to person, place, and time.  Psychiatric:        Attention and Perception: Attention normal.        Mood and Affect: Mood normal.        Speech: Speech normal.        Behavior: Behavior normal.        Thought Content: Thought content normal.        Cognition and Memory: Cognition normal.  Judgment: Judgment is impulsive.   Review of Systems  Psychiatric/Behavioral:  Positive for substance abuse.   All other systems reviewed and are negative. Blood pressure 119/88, pulse 70, temperature 98.7 F (37.1 C), temperature source Oral, resp. rate 18, height 5\' 9"  (1.753 m), weight 95.3 kg, SpO2 97 %. Body mass index is 31.01 kg/m.  Treatment Plan Summary: Plan patient does not meet criteria for inpatient psychiatric hospitalizations.  Patient will remain in the ED and is not accepted into rehabilitation program tomorrow, we will reevaluate and most likely discharge.  Disposition: No evidence of imminent risk to self or others at present.   Patient does not meet criteria for psychiatric inpatient admission.  TTS is referring to inpatient rehabilitation for substance use  Vanetta Mulders, NP 05/21/2022 6:06 PM

## 2022-05-21 NOTE — ED Notes (Signed)
Pt allowed to use phone to make 10 minute phone call. Pt returned phone at this time.

## 2022-05-21 NOTE — ED Notes (Signed)
VOl pending detox placement

## 2022-05-21 NOTE — ED Triage Notes (Signed)
Pt via POV from home. Pt c/o L side chest pain around 0900, pt does has hx of MI. Pt also endorses SOB. Denies cough. States that he also wants helping getting into a detox facility to get off of crack cocaine, last use was last night. Pt also states he is suicidal and would overdose on pills. Denies HI. Denies AVH.  Denies any other substances. Pt is A&Ox4 and NAD

## 2022-05-21 NOTE — BH Assessment (Signed)
Detox  Referral information for detox treatment faxed to:   [facility] [phone] or [phone])   [facility] [phone])   [facility] [phone])   [facility] [phone])  . [facility] [phone])   [facility] [phone])  . [facility] [phone])  . [facility] [phone])  . [facility] [phone])

## 2022-05-21 NOTE — ED Provider Notes (Addendum)
[facility] Provider Note    Event Date/Time   First MD Initiated Contact with Patient 05/21/22 1342     (approximate)   History   Chest Pain and Suicidal   HPI  Roy Panepinto Sr. is a 56 y.o. male  with CAD who comes in with chest pain at 900AM. Wants to get off cocaine last used yesterday.  Reports Si with plan to overdose. Pt reports pain is better but is still there.  He reports that he held his medications for his HIV and thought about overdosing on them but he did not make any attempts.  He reports wanting to try to get detox from the cocaine.  He denies any other symptoms.  He does report some decreased appetite. Pt reports undectable HIV   Physical Exam   Triage Vital Signs: ED Triage Vitals  Enc Vitals Group     BP 05/21/22 1245 119/88     Pulse Rate 05/21/22 1245 70     Resp 05/21/22 1245 18     Temp 05/21/22 1245 98.7 F (37.1 C)     Temp Source 05/21/22 1245 Oral     SpO2 05/21/22 1245 97 %     Weight 05/21/22 1241 210 lb (95.3 kg)     Height 05/21/22 1241 5\' 9"  (1.753 m)     Head Circumference --      Peak Flow --      Pain Score 05/21/22 1241 9     Pain Loc --      Pain Edu? --      Excl. in GC? --     Most recent vital signs: Vitals:   05/21/22 1245  BP: 119/88  Pulse: 70  Resp: 18  Temp: 98.7 F (37.1 C)  SpO2: 97%     General: Awake, no distress.  CV:  Good peripheral perfusion.  Resp:  Normal effort.  Abd:  No distention.  Other:     ED Results / Procedures / Treatments   Labs (all labs ordered are listed, but only abnormal results are displayed) Labs Reviewed  BASIC METABOLIC PANEL - Abnormal; Notable for the following components:      Result Value   Glucose, Bld 131 (*)    BUN 37 (*)    Creatinine, Ser 1.59 (*)    GFR, Estimated 50 (*)    All other components within normal limits  SALICYLATE LEVEL - Abnormal; Notable for the following components:   Salicylate Lvl <7.0 (*)    All other  components within normal limits  ACETAMINOPHEN LEVEL - Abnormal; Notable for the following components:   Acetaminophen (Tylenol), Serum <10 (*)    All other components within normal limits  URINE DRUG SCREEN, QUALITATIVE ([facility] ONLY) - Abnormal; Notable for the following components:   Cocaine Metabolite,Ur [place] POSITIVE (*)    All other components within normal limits  CBC  ETHANOL  TROPONIN I (HIGH SENSITIVITY)     EKG  My interpretation of EKG:  EKG normal sinus but T wave inversions noted with no ST elevation, normal intervals  Reviewed the prior EKGs and he did have some T wave inversions previously in the inferior lateral leads  RADIOLOGY I have reviewed the xray and interpreted personally no evidence of any pneumonia  PROCEDURES:  Critical Care performed: No  Procedures   MEDICATIONS ORDERED IN ED: Medications - No data to display   IMPRESSION / MDM / ASSESSMENT AND PLAN / ED COURSE  I reviewed  the triage vital signs and the nursing notes.   [name] presentation is most consistent with acute presentation with potential threat to life or bodily function.   Patient comes in with concern for chest pain in the setting of cocaine use.  EKG with some T wave inversions but has had this previously we will get 2 troponins and give a dose of IV Ativan.  Once patient has resolution of symptoms he can have p.o. intake but we discussed holding off at this time.  BMP shows elevated creatinine similar to prior CK only slightly elevated patient getting some IV fluids.  UA positive for cocaine.  CBC normal  Patient had off pending repeat troponin and resolution of chest pain will suspect clearance after that.  In the meantime patient placed a psych consultation.  Patient is willing to stay voluntary   Pt is without any acute medical complaints. No exam findings to suggest medical cause of current presentation. Will order psychiatric screening labs and discuss further w/  psychiatric service.  D/d includes but is not limited to psychiatric disease, behavioral/personality disorder, inadequate socioeconomic support, medical.  Based on HPI, exam, unremarkable labs, no concern for acute medical problem at this time. No rigidity, clonus, hyperthermia, focal neurologic deficit, diaphoresis, tachycardia, meningismus, ataxia, gait abnormality or other finding to suggest this visit represents a non-psychiatric problem. Screening labs reviewed.    Given this, pt medically cleared, to be dispositioned per Psych.    The patient has been placed in psychiatric observation due to the need to provide a safe environment for the patient while obtaining psychiatric consultation and evaluation, as well as ongoing medical and medication management to treat the patient's condition.  The patient has not been placed under full IVC at this time.      FINAL CLINICAL IMPRESSION(S) / ED DIAGNOSES   Final diagnoses:  Chest pain, unspecified type  Cocaine abuse (HCC)  Suicide ideation     Rx / DC Orders   ED Discharge Orders     None        Note:  This document was prepared using Dragon voice recognition software and may include unintentional dictation errors.   Concha Se, MD 05/21/22 1433    Concha Se, MD 05/21/22 [phone]

## 2022-05-21 NOTE — ED Notes (Signed)
Pt given dinner tray and drink at this time. 

## 2022-05-21 NOTE — BH Assessment (Signed)
Comprehensive Clinical Assessment (CCA) Note  05/21/2022 Roy [name] [place] [id number]  Roy [name], 56 year old male who presents to [facility] ED voluntarily for treatment. Per triage note, Pt via POV from home. Pt c/o L side chest pain around 0900, pt does has hx of MI. Pt also endorses SOB. Denies cough. States that he also wants helping getting into a detox facility to get off of crack cocaine, last use was last night. Pt also states he is suicidal and would overdose on pills. Denies HI. Denies AVH.  Denies any other substances.   During TTS assessment pt presents alert and oriented x 4, anxious but cooperative, and mood-congruent with affect. The pt does not appear to be responding to internal or external stimuli. Neither is the pt presenting with any delusional thinking. Pt verified the information provided to triage RN.   Pt identifies his main complaint to be that he wants help getting into a detox facility. Patient was recently discharged from [facility] a few days ago and immediately used once he was released. Patient reports that when he returns home, because of his surroundings, he is triggered. "I don't stay in a nice place. Drugs are all around me." Patient states he would like to go to a facility in [place] that allows you to stay anywhere from 67months to a year. Patient was given the opportunity for admission to the facility but decided on the day he was scheduled to leave that he did not want to go. Patient lacks accountability. Pt denies current SI/HI/AH/VH.    Per Sallye Ober, NP pt does not meet criteria for inpatient psychiatric admission. TTS will refer patient to detox/rehab facilities.    Chief Complaint:  Chief Complaint  Patient presents with   Chest Pain   Suicidal   Visit Diagnosis: Cocaine use disorder, severe  Alcohol use disorder   CCA Screening, Triage and Referral (STR)  Patient Reported Information How did you hear about [place]? Self  Referral name: No data  recorded Referral phone number: No data recorded  Whom do you see for routine medical problems? No data recorded Practice/Facility Name: No data recorded Practice/Facility Phone Number: No data recorded Name of Contact: No data recorded Contact Number: No data recorded Contact Fax Number: No data recorded Prescriber Name: No data recorded Prescriber Address (if known): No data recorded  What Is the Reason for Your Visit/Call Today? Patient presents voluntarily requesting detox.  How Long Has This Been Causing You Problems? > than 6 months  What Do You Feel Would Help You the Most Today? Alcohol or Drug Use Treatment   Have You Recently Been in Any Inpatient Treatment (Hospital/Detox/Crisis Center/28-Day Program)? No data recorded Name/Location of Program/Hospital:No data recorded How Long Were You There? No data recorded When Were You Discharged? No data recorded  Have You Ever Received Services From [facility] Before? No data recorded Who Do You See at [facility]? No data recorded  Have You Recently Had Any Thoughts About Hurting Yourself? No  Are You Planning to Commit Suicide/Harm Yourself At This time? No   Have you Recently Had Thoughts About Hurting Someone [name]? No  Explanation: No data recorded  Have You Used Any Alcohol or Drugs in the Past 24 Hours? Yes  How Long Ago Did You Use Drugs or Alcohol? No data recorded What Did You Use and How Much? Crack- unknown   Do You Currently Have a Therapist/Psychiatrist? No  Name of Therapist/Psychiatrist: No data recorded  Have You Been  Recently Discharged From Any [facility] or Programs? Yes  Explanation of Discharge From Practice/Program: [facility] a few days ago.     CCA Screening Triage Referral Assessment Type of Contact: Face-to-Face  Is this Initial or Reassessment? No data recorded Date Telepsych consult ordered in [facility]:  No data recorded Time Telepsych consult ordered in [facility]:  No data  recorded  Patient Reported Information Reviewed? No data recorded Patient Left Without Being Seen? No data recorded Reason for Not Completing Assessment: No data recorded  Collateral Involvement: None provided   Does Patient Have a [place]? No data recorded Name and Contact of Legal Guardian: No data recorded If Minor and Not Living with Parent(s), Who has Custody? n/a  Is CPS involved or ever been involved? Never  Is APS involved or ever been involved? Never   Patient Determined To Be At Risk for Harm To Self or Others Based on Review of Patient Reported Information or Presenting Complaint? No  Method: No data recorded Availability of Means: No data recorded Intent: No data recorded Notification Required: No data recorded Additional Information for Danger to Others Potential: No data recorded Additional Comments for Danger to Others Potential: No data recorded Are There Guns or Other Weapons in Your Home? No data recorded Types of Guns/Weapons: No data recorded Are These Weapons Safely Secured?                            No data recorded Who Could Verify You Are Able To Have These Secured: No data recorded Do You Have any Outstanding Charges, Pending Court Dates, Parole/Probation? No data recorded Contacted To Inform of Risk of Harm To Self or Others: No data recorded  Location of Assessment: [facility] ED   Does Patient Present under Involuntary Commitment? No  IVC Papers Initial File Date: No data recorded  [place] of Residence: [place]   Patient Currently Receiving the Following Services: Not Receiving Services   Determination of Need: Emergent (2 hours)   Options For Referral: ED Visit; Chemical Dependency Intensive Outpatient Therapy (CDIOP)     CCA Biopsychosocial Intake/Chief Complaint:  No data recorded Current Symptoms/Problems: No data recorded  Patient Reported Schizophrenia/Schizoaffective Diagnosis in Past: No   Strengths:  Patient is able to communicate his needs.  Preferences: No data recorded Abilities: No data recorded  Type of Services Patient Feels are Needed: No data recorded  Initial Clinical Notes/Concerns: No data recorded  Mental Health Symptoms Depression:   None   Duration of Depressive symptoms: No data recorded  Mania:   None   Anxiety:    None   Psychosis:   None   Duration of Psychotic symptoms: No data recorded  Trauma:   None   Obsessions:   None   Compulsions:   None   Inattention:   None   Hyperactivity/Impulsivity:   None   Oppositional/Defiant Behaviors:   None   Emotional Irregularity:   None   Other Mood/Personality Symptoms:  No data recorded   Mental Status Exam Appearance and self-care  Stature:   Average   Weight:   Average weight   Clothing:   Casual   Grooming:   Normal   Cosmetic use:   None   Posture/gait:   Normal   Motor activity:   Not Remarkable   Sensorium  Attention:   Normal   Concentration:   Normal   Orientation:   X5   Recall/memory:  Normal   Affect and Mood  Affect:   Appropriate   Mood:   Anxious   Relating  Eye contact:   Normal   Facial expression:   Responsive   Attitude toward examiner:   Cooperative   Thought and Language  Speech flow:  Clear and Coherent   Thought content:   Appropriate to Mood and Circumstances   Preoccupation:   None   Hallucinations:   None   Organization:  No data recorded  [employer] of Knowledge:   Fair   Intelligence:   Average   Abstraction:   Normal   Judgement:   Good   Reality Testing:   Realistic   Insight:   Good   Decision Making:   Normal   Social Functioning  Social Maturity:   Irresponsible   Social Judgement:   "Games developer"   Stress  Stressors:   Housing; Teacher, music Ability:   Normal   Skill Deficits:   Decision making   Supports:   Support needed     Religion:     Leisure/Recreation:    Exercise/Diet: Exercise/Diet Do You Follow a Special Diet?: No   CCA Employment/Education Employment/Work Situation: Employment / Work Technical sales engineer: On disability  Education:     CCA Family/Childhood History Family and Relationship History: Family history Marital status: Single  Childhood History:     Child/Adolescent Assessment:     CCA Substance Use Alcohol/Drug Use: Alcohol / Drug Use Pain Medications: See MAR Prescriptions: See MAR Over the Counter: See MAR History of alcohol / drug use?: Yes Longest period of sobriety (when/how long): about 2.5 to 3 years  Negative Consequences of Use: Financial, Scientist, research (physical sciences), Personal relationships, Work / School Substance #1 Name of Substance 1: Alcohol 1 - Age of First Use: 9 1 - Last Use / Amount: 05/20/22 1- Route of Use: oral Substance #2 Name of Substance 2: Cocaine 2 - Frequency: daily 2 - Last Use / Amount: 05/20/22                     ASAM's:  Six Dimensions of Multidimensional Assessment  Dimension 1:  Acute Intoxication and/or Withdrawal Potential:      Dimension 2:  Biomedical Conditions and Complications:      Dimension 3:  Emotional, Behavioral, or Cognitive Conditions and Complications:     Dimension 4:  Readiness to Change:     Dimension 5:  Relapse, Continued use, or Continued Problem Potential:     Dimension 6:  Recovery/Living Environment:     ASAM Severity Score:    ASAM Recommended Level of Treatment:     Substance use Disorder (SUD) Substance Use Disorder (SUD)  Checklist Symptoms of Substance Use: Continued use despite having a persistent/recurrent physical/psychological problem caused/exacerbated by use, Evidence of tolerance, Evidence of withdrawal (Comment), Presence of craving or strong urge to use, Social, occupational, recreational activities given up or reduced due to use, Recurrent use that results in a failure to fulfill major role  obligations (work, school, home), Large amounts of time spent to obtain, use or recover from the substance(s), Continued use despite persistent or recurrent social, interpersonal problems, caused or exacerbated by use, Persistent desire or unsuccessful efforts to cut down or control use, Repeated use in physically hazardous situations, Substance(s) often taken in larger amounts or over longer times than was intended  Recommendations for Services/Supports/Treatments: Recommendations for Services/Supports/Treatments Recommendations For Services/Supports/Treatments: Detox  DSM5 Diagnoses: Patient Active Problem List  Diagnosis Date Noted   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   Allergic rhinitis 06/21/2020   GERD (gastroesophageal reflux disease) 06/21/2020   Septic hip ([place]) 05/26/2020   Arthritis pain, hip 05/18/2020   Status post total hip replacement, 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   Angioedema 07/24/2019   Chest pain 05/27/2018   ARF (acute renal failure) ([place]) 04/08/2018   Malingering 03/07/2017   Cocaine dependence ([place]) 02/19/2017   Substance induced mood disorder ([place]) 02/19/2017   Alcohol use disorder, severe, dependence ([place]) 12/27/2016   HIV disease ([place]) 10/26/2016   HTN (hypertension) 10/26/2016   Dyslipidemia 10/26/2016   BPH (benign prostatic hyperplasia) 10/26/2016   Constipation 10/26/2016   Acquired hallux rigidus of right foot 07/16/2016   Arthritis 10/25/2014   Genital warts 08/25/2013    Patient Centered Plan: Patient is on the following Treatment Plan(s):  Substance Abuse   Referrals to Alternative Service(s): Referred to Alternative Service(s):   Place:   Date:   Time:    Referred to Alternative Service(s):   Place:   Date:   Time:    Referred to Alternative Service(s):   Place:   Date:   Time:    Referred to Alternative Service(s):   Place:   Date:   Time:      @BHCOLLABOFCARE @  [place], Counselor, LCAS-A

## 2022-05-22 MED ORDER — SODIUM CHLORIDE 0.9 % IV BOLUS
500.0000 mL | Freq: Once | INTRAVENOUS | Status: AC
  Administered 2022-05-22: 500 mL via INTRAVENOUS

## 2022-05-22 MED FILL — PEG 3350-ELECTROLYTES 236 GRAM-22.74 GRAM-6.74 GRAM-5.86 GRAM SOLUTION: ORAL | 1 days supply | Qty: 4000 | Fill #0

## 2022-05-22 NOTE — ED Notes (Signed)
Pt given snack. 

## 2022-05-22 NOTE — ED Notes (Signed)
Patient given ginger ale. 

## 2022-05-22 NOTE — Discharge Instructions (Signed)

## 2022-05-22 NOTE — ED Notes (Signed)
Pt dressing for discharge.  

## 2022-05-22 NOTE — ED Notes (Signed)
Pt discharged home.  Last taken VS stable.  All belongings returned to patient. Pt denies SI.

## 2022-05-22 NOTE — ED Provider Notes (Signed)
Vitals:   05/21/22 1926 05/22/22 0906  BP: (!) 88/57 139/82  Pulse: 70 73  Resp: 20   Temp: 98.5 F (36.9 C) 98.3 F (36.8 C)  SpO2: 94% 97%     Patient resting comfortably.  No concerns at this time.  He has requested discharge.  He is voluntary, does not wish to stay for placement to a rehab facility.  Discussed with him and recommended against use of cocaine and discussed careful chest pain return precautions.  He is aware that he can follow-up with [facility] and I have also recommended he follow-up with cardiology.  Return precautions and treatment recommendations and follow-up discussed with the patient who is agreeable with the plan.  Psychiatry is seen and assessed, Gabriel Cirri NP advising no risk to self or indication for IVC.   Sharyn Creamer, MD 05/22/22 1332

## 2022-05-22 NOTE — ED Notes (Signed)
Pt asking to be discharged.  EDP made aware.

## 2022-05-26 ENCOUNTER — Ambulatory Visit
Admit: 2022-05-26 | Discharge: 2022-05-26 | Disposition: A | Discharge status: Other Acute Inpatient Hospital | Payer: MEDICAID

## 2022-05-26 DIAGNOSIS — F1490 Cocaine use, unspecified, uncomplicated: Principal | ICD-10-CM

## 2022-05-26 DIAGNOSIS — F1994 Other psychoactive substance use, unspecified with psychoactive substance-induced mood disorder: Principal | ICD-10-CM

## 2022-06-09 DIAGNOSIS — M1611 Unilateral primary osteoarthritis, right hip: Principal | ICD-10-CM

## 2022-06-11 ENCOUNTER — Institutional Professional Consult (permissible substitution): Admit: 2022-06-11 | Discharge: 2022-06-12 | Payer: MEDICAID

## 2022-07-02 ENCOUNTER — Ambulatory Visit: Admit: 2022-07-02 | Discharge: 2022-07-03 | Payer: MEDICAID

## 2022-07-02 DIAGNOSIS — R0981 Nasal congestion: Principal | ICD-10-CM

## 2022-07-02 DIAGNOSIS — R062 Wheezing: Principal | ICD-10-CM

## 2022-07-02 DIAGNOSIS — B20 Human immunodeficiency virus [HIV] disease: Principal | ICD-10-CM

## 2022-07-02 DIAGNOSIS — R519 Nonintractable headache, unspecified chronicity pattern, unspecified headache type: Principal | ICD-10-CM

## 2022-07-02 MED ORDER — CETIRIZINE 10 MG TABLET
ORAL_TABLET | Freq: Every day | ORAL | 2 refills | 30 days | Status: CP
Start: 2022-07-02 — End: 2022-09-30
  Filled 2022-07-03: qty 30, 30d supply, fill #0

## 2022-07-02 MED ORDER — ALBUTEROL SULFATE HFA 90 MCG/ACTUATION AEROSOL INHALER
Freq: Four times a day (QID) | RESPIRATORY_TRACT | 3 refills | 106 days | Status: CP | PRN
Start: 2022-07-02 — End: ?
  Filled 2022-07-03: qty 18, 25d supply, fill #0

## 2022-07-02 MED ORDER — FLUTICASONE PROPIONATE 50 MCG/ACTUATION NASAL SPRAY,SUSPENSION
Freq: Every day | NASAL | 2 refills | 120 days | Status: CP
Start: 2022-07-02 — End: 2023-07-02
  Filled 2022-07-03: qty 16, 60d supply, fill #0

## 2022-07-02 MED ORDER — ASPIRIN 81 MG TABLET,DELAYED RELEASE
ORAL_TABLET | Freq: Every day | ORAL | 3 refills | 90 days | Status: CP
Start: 2022-07-02 — End: ?
  Filled 2022-07-03: qty 30, 30d supply, fill #0

## 2022-07-02 MED ORDER — BIKTARVY 50 MG-200 MG-25 MG TABLET
ORAL_TABLET | Freq: Every day | ORAL | 3 refills | 90 days | Status: CP
Start: 2022-07-02 — End: ?

## 2022-07-03 ENCOUNTER — Ambulatory Visit: Admit: 2022-07-03 | Discharge: 2022-07-04 | Payer: MEDICAID

## 2022-07-03 DIAGNOSIS — F332 Major depressive disorder, recurrent severe without psychotic features: Principal | ICD-10-CM

## 2022-07-03 DIAGNOSIS — I10 Essential (primary) hypertension: Principal | ICD-10-CM

## 2022-07-03 DIAGNOSIS — Z0000 Encounter for general adult medical examination without abnormal findings: Principal | ICD-10-CM

## 2022-07-03 DIAGNOSIS — H25013 Cortical age-related cataract, bilateral: Principal | ICD-10-CM

## 2022-07-03 DIAGNOSIS — B20 Human immunodeficiency virus [HIV] disease: Principal | ICD-10-CM

## 2022-07-03 DIAGNOSIS — M1612 Unilateral primary osteoarthritis, left hip: Principal | ICD-10-CM

## 2022-07-03 DIAGNOSIS — Z113 Encounter for screening for infections with a predominantly sexual mode of transmission: Principal | ICD-10-CM

## 2022-07-03 MED ORDER — BICTEGRAVIR 50 MG-EMTRICITABINE 200 MG-TENOFOVIR ALAFENAM 25 MG TABLET
ORAL_TABLET | Freq: Every day | ORAL | 11 refills | 30.00000 days | Status: CP
Start: 2022-07-03 — End: ?
  Filled 2022-07-03: qty 30, 30d supply, fill #0

## 2022-08-10 DIAGNOSIS — B20 Human immunodeficiency virus [HIV] disease: Principal | ICD-10-CM

## 2022-08-10 MED ORDER — BICTEGRAVIR 50 MG-EMTRICITABINE 200 MG-TENOFOVIR ALAFENAM 25 MG TABLET
ORAL_TABLET | Freq: Every day | ORAL | 6 refills | 30 days | Status: CP
Start: 2022-08-10 — End: ?

## 2022-08-15 DIAGNOSIS — B20 Human immunodeficiency virus [HIV] disease: Principal | ICD-10-CM

## 2022-08-15 MED ORDER — BICTEGRAVIR 50 MG-EMTRICITABINE 200 MG-TENOFOVIR ALAFENAM 25 MG TABLET
ORAL_TABLET | Freq: Every day | ORAL | 6 refills | 30 days | Status: CP
Start: 2022-08-15 — End: ?

## 2022-10-02 ENCOUNTER — Ambulatory Visit: Admit: 2022-10-02 | Payer: MEDICAID

## 2022-10-02 DIAGNOSIS — B20 Human immunodeficiency virus [HIV] disease: Principal | ICD-10-CM

## 2022-10-02 MED ORDER — BICTEGRAVIR 50 MG-EMTRICITABINE 200 MG-TENOFOVIR ALAFENAM 25 MG TABLET
ORAL_TABLET | Freq: Every day | ORAL | 4 refills | 30 days | Status: CP
Start: 2022-10-02 — End: ?

## 2022-10-04 ENCOUNTER — Ambulatory Visit: Admit: 2022-10-04 | Payer: MEDICAID

## 2022-10-11 ENCOUNTER — Ambulatory Visit: Admit: 2022-10-11 | Payer: MEDICAID

## 2022-10-20 ENCOUNTER — Emergency Department
Admission: EM | Admit: 2022-10-20 | Discharge: 2022-10-20 | Disposition: A | Discharge status: Home / Self Care | Payer: Medicaid Other | Attending: Emergency Medicine | Admitting: Emergency Medicine

## 2022-10-20 ENCOUNTER — Other Ambulatory Visit: Payer: Self-pay

## 2022-10-20 DIAGNOSIS — M79601 Pain in right arm: Secondary | ICD-10-CM | POA: Insufficient documentation

## 2022-10-20 DIAGNOSIS — B20 Human immunodeficiency virus [HIV] disease: Secondary | ICD-10-CM | POA: Diagnosis not present

## 2022-10-20 DIAGNOSIS — I2510 Atherosclerotic heart disease of native coronary artery without angina pectoris: Secondary | ICD-10-CM | POA: Insufficient documentation

## 2022-10-20 DIAGNOSIS — Z96641 Presence of right artificial hip joint: Secondary | ICD-10-CM | POA: Diagnosis not present

## 2022-10-20 DIAGNOSIS — J45909 Unspecified asthma, uncomplicated: Secondary | ICD-10-CM | POA: Insufficient documentation

## 2022-10-20 DIAGNOSIS — I10 Essential (primary) hypertension: Secondary | ICD-10-CM | POA: Diagnosis not present

## 2022-10-20 MED ORDER — IBUPROFEN 400 MG PO TABS
400.0000 mg | ORAL_TABLET | Freq: Once | ORAL | Status: AC
Start: 2022-10-20 — End: 2022-10-20
  Administered 2022-10-20: 400 mg via ORAL
  Filled 2022-10-20: qty 1

## 2022-10-20 NOTE — ED Triage Notes (Signed)
Pt presents to ED with /co of bilateral arm pain, pt states HX of arthritis. Pt states he has no injury or trauma. Pt denies any other complains at this time. Pt is A&Ox4. Pt ambulatory with steady gait. NAD noted.   Pt states he did take some OTC medication with no relief.

## 2022-10-20 NOTE — ED Notes (Signed)
This RN went to D/C pt and pt states to this RN "I need a ride home". Pt educated he would need to attempt to find a ride home.

## 2022-10-20 NOTE — ED Provider Notes (Signed)
[facility] Provider Note    Event Date/Time   First MD Initiated Contact with Patient 10/20/22 [phone]     (approximate)   History   Arm Pain   HPI  Roy [name] is a 56 y.o. male with past medical history of HIV (on Biktarvy tells me viral load undetectable), polysubstance use disorder presenting with arm pain.  [name] symptoms started yesterday.  He endorses pain in the hand wrist elbow and shoulder on the right side.  He feels like it swollen.  Denies any injury.  He denies fevers chills peer denies numbness ting weakness.  Does have some pain in the right lateral neck as well.  Patient has history of arthritis.  Took his Tylenol arthritis but it did not relieve the pain which is why he comes here today.     Past Medical History:  Diagnosis Date   AIDS (acquired immune deficiency syndrome) (HCC)    Arthritis    Asthma    Bipolar disorder (HCC)    Bronchitis    Complication of anesthesia    Coronary artery disease    Depression    Dysrhythmia    1st degree heart block/ brady   GERD (gastroesophageal reflux disease)    Hepatitis C    treated   HIV (human immunodeficiency virus infection) (HCC)    HTN (hypertension)    PONV (postoperative nausea and vomiting)     Patient Active Problem List   Diagnosis Date Noted   Cocaine-induced mood disorder (HCC) 11/13/2020   Allergic rhinitis 06/21/2020   GERD (gastroesophageal reflux disease) 06/21/2020   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   Arthritis pain, hip 05/18/2020   Status post total hip replacement, 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   Angioedema 07/24/2019   Chest pain 05/27/2018   ARF (acute renal failure) (HCC) 04/08/2018   Malingering 03/07/2017   Cocaine dependence (HCC) 02/19/2017   Substance induced mood disorder (HCC) 02/19/2017   Alcohol use disorder, severe, dependence (HCC) 12/27/2016   HIV disease (HCC) 10/26/2016   HTN (hypertension) 10/26/2016   Dyslipidemia 10/26/2016   BPH (benign  prostatic hyperplasia) 10/26/2016   Constipation 10/26/2016   Acquired hallux rigidus of right foot 07/16/2016   Arthritis 10/25/2014   Genital warts 08/25/2013     Physical Exam  Triage Vital Signs: ED Triage Vitals [10/20/22 0851]  Enc Vitals Group     BP (!) 154/101     Pulse Rate (!) 55     Resp 17     Temp 98 F (36.7 C)     Temp Source Oral     SpO2 100 %     Weight 201 lb (91.2 kg)     Height 5\' 9"  (1.753 m)     Head Circumference      Peak Flow      Pain Score 7     Pain Loc      Pain Edu?      Excl. in GC?     Most recent vital signs: Vitals:   10/20/22 0851  BP: (!) 154/101  Pulse: (!) 55  Resp: 17  Temp: 98 F (36.7 C)  SpO2: 100%     General: Awake, no distress.  CV:  Good peripheral perfusion.  Resp:  Normal effort.  Abd:  No distention.  Neuro:             Awake, Alert, Oriented x 3  Other:  Mild swelling of the right dorsal hand and wrist, no significant edema extending  up the arm right wrist is mildly tender but he is able to range it there is no pain with micro movements, patient able to range the elbow without significant discomfort, no pain with micro movements, no significant swelling or focal tenderness to palpation.  Patient able to flex and abduct the shoulder fully, tenderness along the right cervical paraspinal musculature 2+ Radial pulse bilaterally 5 out of 5 strength with elbow flexion extension wrist extension grip and finger abduction bilateral upper extremities  ED Results / Procedures / Treatments  Labs (all labs ordered are listed, but only abnormal results are displayed) Labs Reviewed - No data to display   EKG     RADIOLOGY    PROCEDURES:  Critical Care performed: No  Procedures   MEDICATIONS ORDERED IN ED: Medications  ibuprofen (ADVIL) tablet 400 mg (has no administration in time range)     IMPRESSION / MDM / ASSESSMENT AND PLAN / ED COURSE  I reviewed the triage vital signs and the nursing notes.                               [name] presentation is most consistent with exacerbation of chronic illness.  Differential diagnosis includes, but is not limited to, osteoarthritis flare, inflammatory arthritis, less likely septic arthritis, upper extremity DVT  Patient is a 56 year old male presenting with right arm pain.  Symptoms started yesterday.  There is no preceding trauma.  Started in the hand and then migrated to the wrist elbow and shoulder.  On exam he does have some swelling of the right dorsal hand and wrist but no other significant swelling of the forearm elbow upper arm or shoulder.  He is able to range the wrist elbow and shoulder and there is no pain with micro movements.  Exam is not consistent with a septic arthritis.  Differential includes osteoarthritis versus inflammatory arthritis.  Given minimal significant pain with range no overlying erythema or warmth my suspicion for inflammatory arthritis is less and I suspect this is underlying osteoarthritis.  Considered upper extremity DVT but the swelling is really only in the hand so this is less likely.  He is neurovascular intact.  Patient given Motrin.  On last BMP he does have some decreased GFR so we will avoid prescribing chronic NSAIDs.  Patient is already taking Tylenol arthritis recommend he continue to do so recommended ice and heat.  Follow-up with PCP.        FINAL CLINICAL IMPRESSION(S) / ED DIAGNOSES   Final diagnoses:  Right arm pain     Rx / DC Orders   ED Discharge Orders     None        Note:  This document was prepared using Dragon voice recognition software and may include unintentional dictation errors.   Rada Hay, MD 10/20/22 [phone]

## 2022-10-20 NOTE — Discharge Instructions (Signed)
I suspect that your pain in your hand wrist and elbow and shoulder are related to underlying osteoarthritis.  Can continue to take the Tylenol arthritis you can also use ice and heat for the pain.  Please follow-up with your primary care provider.  Return to the emergency department if you develop fevers redness or worsening swelling.

## 2022-10-20 NOTE — ED Notes (Signed)
D/C and reasons to return and OTC medications discussed with pt. Pt ambulatory on D/C with steady gait. NAD noted.  Pt states he will take the bus and refuses D/C vitals.

## 2022-10-25 DIAGNOSIS — M1611 Unilateral primary osteoarthritis, right hip: Principal | ICD-10-CM

## 2022-10-30 ENCOUNTER — Ambulatory Visit: Admit: 2022-10-30 | Discharge: 2022-10-30 | Payer: MEDICAID

## 2022-10-30 DIAGNOSIS — E785 Hyperlipidemia, unspecified: Principal | ICD-10-CM

## 2022-10-30 DIAGNOSIS — Z125 Encounter for screening for malignant neoplasm of prostate: Principal | ICD-10-CM

## 2022-10-30 DIAGNOSIS — Z9189 Other specified personal risk factors, not elsewhere classified: Principal | ICD-10-CM

## 2022-10-30 DIAGNOSIS — B20 Human immunodeficiency virus [HIV] disease: Principal | ICD-10-CM

## 2022-10-30 DIAGNOSIS — M1612 Unilateral primary osteoarthritis, left hip: Principal | ICD-10-CM

## 2022-10-30 DIAGNOSIS — R634 Abnormal weight loss: Principal | ICD-10-CM

## 2022-10-30 DIAGNOSIS — Z79899 Other long term (current) drug therapy: Principal | ICD-10-CM

## 2022-10-30 DIAGNOSIS — Z5181 Encounter for therapeutic drug level monitoring: Principal | ICD-10-CM

## 2022-10-30 MED ORDER — ROSUVASTATIN 20 MG TABLET
ORAL_TABLET | Freq: Every evening | ORAL | 11 refills | 30 days | Status: CP
Start: 2022-10-30 — End: 2023-10-30

## 2022-10-30 MED ORDER — LOSARTAN 100 MG TABLET
ORAL_TABLET | Freq: Every day | ORAL | 11 refills | 30 days | Status: CP
Start: 2022-10-30 — End: 2023-10-30

## 2022-11-02 NOTE — Unmapped (Signed)
[facility]   Patient Onboarding/Medication Counseling    Samuel Baker is a 56 y.o. male with HIV who I am counseling today on continuation of therapy.  I am speaking to the patient.    Was a Nurse, learning disability used for this call? No    Verified patient's date of birth / HIPAA.    Specialty medication(s) to be sent: Infectious Disease: Biktarvy      Non-specialty medications/supplies to be sent: n/a      Medications not needed at this time: n/a         Biktarvy (bictegravir, emtricitabine, and tenofovir alafenamide)    The patient declined counseling on medication administration, missed dose instructions, goals of therapy, side effects and monitoring parameters, warnings and precautions, drug/food interactions, and storage, handling precautions, and disposal because they have taken the medication previously. The information in the declined sections below are for informational purposes only and was not discussed with patient.       Medication & Administration     Dosage: Take 1 tablet by mouth daily    Administration: Take without regard to food    Adherence/Missed dose instructions: take missed dose as soon as you remember. If it is close to the time of your next dose, skip the dose and resume with your next scheduled dose.    Goals of Therapy     To suppress viral replication and keep patient's HIV undetectable by lab tests    Side Effects & Monitoring Parameters     Common Side Effects:  Diarrhea  Upset stomach  Headache  Changes in Weight  Changes in mood    The following side effects should be reported to the provider:     If patient experiences: signs of an allergic reaction (rash; hives; itching; red, swollen, blistered, or peeling skin with or without fever; wheezing; tightness in the chest or throat; trouble breathing, swallowing, or talking; unusual hoarseness; or swelling of the mouth, face, lips, tongue, or throat)  signs of kidney problems (unable to pass urine, change in how much urine is passed, blood in the urine, or a big weight gain)  signs of liver problems (dark urine, feeling tired, not hungry, upset stomach or stomach pain, light-colored stools, throwing up, or yellow skin or eyes)  signs of lactic acidosis (fast breathing, fast heartbeat, a heartbeat that does not feel normal, very bad upset stomach or throwing up, feeling very sleepy, shortness of breath, feeling very tired or weak, very bad dizziness, feeling cold, or muscle pain or cramps)  Weight gain: some patients have reported weight gain after starting this medication. The amount of weight can vary.    Monitoring Parameters:  CD4  Count  HIV RNA plasma levels,  Liver function  Total bilirubin  serum creatinine  urine glucose  urine protein (prior to or when initiating therapy and as clinically indicated during therapy);       Drug/Food Interactions     Medication list reviewed in Epic. The patient was instructed to inform the care team before taking any new medications or supplements. No drug interactions identified.   Calcium Salts: May decrease the serum concentration of Biktarvy. If taken with food, Biktarvy can be administered with calcium salts.   Iron Preparations: May decrease the serum concentration of Biktarvy. If taken with food, Biktarvy can be administered with Ferrous sulfate. If taken on an empty stomach, Biktarvy must be taken 2 hours before ferrous sulfate. Avoid other iron salts.    Contraindications, Warnings, &  Precautions     Black Box Warning: Severe acute exacertbations of HBV have been reported in patients coinfected with HIV-1 and HBV fllowing discontinuation of therapy  Coadministration with dofetilide, rifampin is contraindicated  Immune reconstitution syndrome: Patients may develop immune reconstitution syndrome, resulting in the occurrence of an inflammatory response to an indolent or residual opportunistic infection or activation of autoimmune disorders (eg, [name] disease, polymyositis, Guillain-Barr?? syndrome, autoimmune hepatitis)   Lactic acidosis/hepatomegaly  Renal toxicity: patients with preexisting renal impairment and those taking nephrotoxic agents (including NSAIDs) are at increased risk.     Storage, Handling Precautions, & Disposal     Store in the original container at room temperature.   Keep lid tightly closed.   Store in a dry place. Do not store in a bathroom.   Keep all drugs in a safe place. Keep all drugs out of the reach of children and pets.   Throw away unused or expired drugs. Do not flush down a toilet or pour down a drain unless you are told to do so. Check with your pharmacist if you have questions about the best way to throw out drugs. There may be drug take-back programs in your area.      Current Medications (including OTC/herbals), Comorbidities and Allergies     Current Outpatient Medications   Medication Sig Dispense Refill    albuterol HFA 90 mcg/actuation inhaler Inhale 1 puff every six (6) hours as needed for wheezing. 18 g 3    aspirin (ECOTRIN) 81 MG tablet Take 1 tablet (81 mg total) by mouth daily. 90 tablet 3    bictegrav-emtricit-tenofov ala (BIKTARVY) 50-200-25 mg tablet Take 1 tablet by mouth daily. 90 tablet 3    bictegrav-emtricit-tenofov ala (BIKTARVY) 50-200-25 mg tablet Take 1 tablet by mouth daily. 30 tablet 11    bictegrav-emtricit-tenofov ala (BIKTARVY) 50-200-25 mg tablet Take 1 tablet by mouth daily. 30 tablet 4    finasteride (PROSCAR) 5 mg tablet Take 1 tablet (5 mg total) by mouth daily. 90 tablet 3    fluticasone propionate (FLONASE) 50 mcg/actuation nasal spray Instill 1 spray into each nostril daily. 16 g 2    losartan (COZAAR) 100 MG tablet Take 1 tablet (100 mg total) by mouth daily. 30 tablet 11    rosuvastatin (CRESTOR) 20 MG tablet Take 1 tablet (20 mg total) by mouth nightly. 30 tablet 11    tamsulosin (FLOMAX) 0.4 mg capsule Take 1 capsule (0.4 mg total) by mouth daily. Take 30 minutes after the same meal each day. 90 capsule 3    traZODone (DESYREL) 100 MG tablet TAKE 1 TO 2 TABLETS BY MOUTH EVERY NIGHT AT BEDTIME AS NEEDED FOR INSOMNIA       No current facility-administered medications for this visit.       Allergies   Allergen Reactions    Ace Inhibitors Swelling     Had been on an ace-inhibitor, taken off for angioedema, but does not remember name of medication.     Amlodipine Swelling     Of the tongue      Lactose Other (See Comments)     GI distress      Lisinopril Swelling    Bee Pollen Itching and Other (See Comments)     Itchy eyes and runny nose    Pollen Extracts Itching    Yellow Jacket Venom Itching       Patient Active Problem List   Diagnosis    Ventral hernia    Genital warts  HTN (hypertension) (RAF-HCC)    HIV disease (CMS-HCC)    Constipation    Arthritis    Tobacco use disorder    Acquired hallux rigidus of right foot    PND (paroxysmal nocturnal dyspnea)    Arthritis pain, hip    Alcohol use disorder, severe, dependence (CMS-HCC)    Wheezing    Hyperlipidemia    Allergic rhinitis    GERD (gastroesophageal reflux disease)    Angioedema    BPH (benign prostatic hyperplasia)    Dyslipidemia    MDD (major depressive disorder), recurrent episode, moderate (CMS-HCC)    Substance induced mood disorder (CMS-HCC)    Primary osteoarthritis of right hip    Status post total hip replacement, right    Cocaine-induced mood disorder (CMS-HCC)    Bradycardia    Second degree heart block    Uncomplicated alcohol dependence (CMS-HCC)    Prediabetes       Reviewed and up to date in Epic.    HIV ASSOCIATED LABS:     Lab Results   Component Value Date/Time    HIVRS Detected (A) 10/30/2022 11:29 AM    HIVRS Not Detected 12/28/2020 11:41 AM    HIVRS Not Detected 09/21/2020 09:28 AM    HIVRS Not Detected 01/24/2015 10:06 AM    HIVRS Detected 10/25/2014 11:22 AM    HIVRS Not Detected 09/03/2014 12:29 PM    HIVCP <20 (H) 10/30/2022 11:29 AM    HIVCP 79 (H) 04/30/2018 10:54 AM    HIVCP 51 (H) 03/28/2017 02:39 PM    HIVCP <40 10/25/2014 11:22 AM    HIVCP <40 07/20/2014 10:20 AM    HIVCP 508 08/25/2013 12:20 PM    ACD4 700 10/30/2022 11:29 AM    ACD4 560 07/03/2022 10:25 AM    ACD4 540 06/15/2020 10:13 AM    ACD4 644 01/24/2015 10:06 AM    ACD4 469 (L) 10/25/2014 11:22 AM    ACD4 274 (L) 07/20/2014 10:20 AM         Appropriateness of Therapy     Acute infections noted within Epic:  No active infections  Patient reported infection: None    Is medication and dose appropriate based on diagnosis and infection status? Yes    Prescription has been clinically reviewed: Yes      Baseline Quality of Life Assessment      How many days over the past month did your HIV  keep you from your normal activities? For example, brushing your teeth or getting up in the morning. 0    Financial Information     Medication Assistance provided: None Required    Anticipated copay of $0.00 reviewed with patient. Verified delivery address.    Delivery Information     Scheduled delivery date: 11/13/22    Expected start date: continuation of therapy.    Medication will be delivered via Same Day Courier to the prescription address in [facility].  This shipment will not require a signature.      Explained the services we provide at [facility] and that each month we would call to set up refills.  Stressed importance of returning phone calls so that we could ensure they receive their medications in time each month.  Informed patient that we should be setting up refills 7-10 days prior to when they will run out of medication.  A pharmacist will reach out to perform a clinical assessment periodically.  Informed patient that a welcome packet, containing information about  our pharmacy and other support services, a Notice of Privacy Practices, and a drug information handout will be sent.      The patient or caregiver noted above participated in the development of this care plan and knows that they can request review of or adjustments to the care plan at any time.      Patient or caregiver verbalized understanding of the above information as well as how to contact the pharmacy at [phone] option 4 with any questions/concerns.  The pharmacy is open Monday through Friday 8:30am-4:30pm.  A pharmacist is available 24/7 via pager to answer any clinical questions they may have.    Patient Specific Needs     Does the patient have any physical, cognitive, or cultural barriers? No    Does the patient have adequate living arrangements? (i.e. the ability to store and take their medication appropriately) Yes    Did you identify any home environmental safety or security hazards? No    Patient prefers to have medications discussed with  Patient     Is the patient or caregiver able to read and understand education materials at a high school level or above? Yes    [name] primary language is  English     Is the patient high risk? No    SOCIAL DETERMINANTS OF HEALTH     At the [facility], we have learned that life circumstances - like trouble affording food, housing, utilities, or transportation can affect the health of many of our patients.   That is why we wanted to ask: are you currently experiencing any life circumstances that are negatively impacting your health and/or quality of life? Patient declined to answer    Social Determinants of Health     Financial Resource Strain: High Risk (11/20/2021)    Overall Financial Resource Strain (CARDIA)     Difficulty of Paying Living Expenses: Hard   Internet Connectivity: Internet connectivity concern identified (07/02/2022)    Internet Connectivity     Do you have access to internet services: No     How do you connect to the internet: Not on file     Is your internet connection strong enough for you to watch video on your device without major problems?: Not on file     Do you have enough data to get through the month?: Not on file     Does at least one of the devices have a camera that you can use for video chat?: Not on file   Food Insecurity: No Food Insecurity (11/20/2021)    Hunger Vital Sign     Worried About Running Out of Food in the Last Year: Never true     Ran Out of Food in the Last Year: Never true   Recent Concern: Food Insecurity - Food Insecurity Present (10/04/2021)    Hunger Vital Sign     Worried About Running Out of Food in the Last Year: Often true     Ran Out of Food in the Last Year: Often true   Tobacco Use: High Risk (10/30/2022)    Patient History     Smoking Tobacco Use: Every Day     Smokeless Tobacco Use: Former     Passive Exposure: Not on file   Housing/Utilities: High Risk (07/02/2022)    Housing/Utilities     Within the past 12 months, have you ever stayed: outside, in a car, in a tent, in an overnight shelter, or temporarily in someone else's home (i.e.  couch-surfing)?: No     Are you worried about losing your housing?: Yes     Within the past 12 months, have you been unable to get utilities (heat, electricity) when it was really needed?: Yes   Alcohol Use: Not At Risk (07/02/2022)    Alcohol Use     How often do you have a drink containing alcohol?: Never     How many drinks containing alcohol do you have on a typical day when you are drinking?: 1 - 2     How often do you have 5 or more drinks on one occasion?: Never   Transportation Needs: No Transportation Needs (11/20/2021)    PRAPARE - Transportation     Lack of Transportation (Medical): No     Lack of Transportation (Non-Medical): No   Substance Use: Low Risk  (07/02/2022)    Substance Use     Taken prescription drugs for non-medical reasons: Never     Taken illegal drugs: Never     Patient indicated they have taken drugs in the past year for non-medical reasons: Yes, [positive answer(s)]: Not on file   Health Literacy: Low Risk  (07/02/2022)    Health Literacy     : Never   Physical Activity: Not on file   Interpersonal Safety: Not at risk (07/02/2022)    Interpersonal Safety     Unsafe Where You Currently Live: No     Physically Hurt by Anyone: No     Abused by Anyone: No Stress: Not on file   Intimate Partner Violence: Not At Risk (07/13/2021)    Humiliation, Afraid, Rape, and Kick questionnaire     Fear of Current or Ex-Partner: No     Emotionally Abused: No     Physically Abused: No     Sexually Abused: No   Depression: At risk (07/02/2022)    PHQ-2     PHQ-2 Score: 5   Social Connections: Not on file       Would you be willing to receive help with any of the needs that you have identified today? Not applicable       [name], PharmD  [facility] Specialty Pharmacist

## 2022-11-02 NOTE — Unmapped (Addendum)
Returned call. ICD codes for HIV and osteoarthritis   given.     ----- Message from Franki Cabot sent at 11/02/2022  3:06 PM EST -----  The PAC has received an incoming clinical call:    Caller name: Annice Pih from [facility] out of [place]   If not the patient, relationship to the patient.  Best callback number: [phone]  Describe the reason for the call: Needs ICD codes  Was an appointment offered as a placeholder?N/A      Thanks!

## 2022-11-02 NOTE — Unmapped (Signed)
[facility] Special[facility] Medication Onboarding    Specialty Medication: BIKTARVY 50-200-25 mg tablet (bictegrav-emtricit-tenofov ala)  Prior Authorization: Not Required   Financial Assistance: No - copay  <$25  Final Copay/Day Supply: $0 / 30    Insurance Restrictions: None     Notes to Pharmacist:     The triage team has completed the benefits investigation and has determined that the patient is able to fill this medication at [facility]. Please contact the patient to complete the onboarding or follow up with the prescribing physician as needed.

## 2022-11-07 NOTE — Unmapped (Signed)
Referral Services Note     Duration of Intervention: 20 minutes    TYPE OF CONTACT: Phone and Text    ASSESSMENT: Patient inquired about food resources within his county.    INTERVENTION:  SW provided patient with the following information:     2021[email or web address] ([email or web address])     PLAN:  Patient stated that he would follow up with the resources provided. SW will continue to assist as needed.       Earla Charlie, MSW  [facility]

## 2022-11-13 NOTE — Unmapped (Signed)
Samuel Baker 's Biktarvy shipment will be delayed as a result of the medication is too soon to refill until 12/12.     I have reached out to the patient  at [phone] and left a voicemail message.  We will wait for a call back from the patient to reschedule the delivery.  We have not confirmed the new delivery date.     Last filled/shipped from [id number] - Phone # [phone]

## 2022-11-18 NOTE — Unmapped (Unsigned)
Infectious Diseases Clinic    Patient did not attend his appointment for today, will need to reschedule Ophthalmology clinic at [facility] for assessment.  - Review in ID Clinic in 2 months time.      To do @ next RTC  HIV Return panel    I personally spent 60 minutes face-to-face and non-face-to-face in the care of this patient, which includes all pre, intra, and post visit time on the date of service.  All documented time was specific to the E/M visit and does not include any procedures that may have been performed.      Ailene Rud, MD  [facility] Infectious Diseases              Chief Complaint   Establish care for HIV    HPI  In addition to details in A&P above  Samuel Baker is a 56 y.o. male  a history of HIV, MDD with SI, polysubstance use (cocaine, alcohol, tobacco), s/p THA with R prosthetic hip infection, HLD, HTN here for follow-up. Previously followed by Dr. Amparo Bristol.   He has a history of HIV dx in 2006 while at [facility] (initially on TDF/FTC/EFV, then TDF/FTC/RPV, then TDF/FTC/DTG --> TAF/FTC/DTG and now TAF/FTC/BIC started in 2020. He was undetectable when last seen January 2022.    11.14.23    Feeling better currently living in his own apartment. On Food stamps  Has been with the same partner for 21 years.  Over the past year only one sexual partner.  No penile discharge, no oral thrush   Gets breathless on exertion mainly due to bronchitis  Smoker 10 cig/day. Not planning on quitting.  Right arm swelling and he went to the ED department last week, and was told symptoms are due to arthritis. Symptoms now resolved.  Concerned regarding losing weight despite eating, no chronic cough, no abdominal pain , no chest pain, no back pain.      Sober not using at any recreational drugs.  Wt Readings from Last 3 Encounters:   07/03/22 (!) 103 kg (227 lb)   07/02/22 (!) 103.3 kg (227 lb 12.8 oz)   03/12/22 98.3 kg (216 lb 12.8 oz)      There were no vitals filed for this visit.         There were no vitals taken for this visit.    Repeat HR 51    Physical Exam:    Constitutional: No acute distress  Eyes: Lids and lashes normal.  Anicteric sclera.  ENT: Oropharynx without any erythema or exudate.  Moist mucous membranes. Edentulous  Neck: Supple without any enlargement  Lymph nodes: No adenopathy in cervical or supraclavicular nodes  Cardiovascular: Regular, rate and rhythm without murmurs rubs or gallops.  Palpable distal pulses.  No edema  Pulmonary: Clear to auscultation bilaterally without wheezes/crackles/rhonchi.  Normal work of breathing.  Symmetric chest wall movement.  Abdomen/GI: Soft, nontender, nondistended  Skin/Integument: Warm and dry without rash  Musculoskeletal: No joint tenderness.  Full range of motion.  Neurologic: Grossly nonfocal.  Alert and oriented to person, place and time.  Cranial nerves grossly intact.  Moves all extremities.  Psychiatry: Appropriate         Health maintenance      Oral health   The patient does or does not have a dentist. Last dental exam [facility]. Last check up was 7 months ago, had check ups. Awaiting dentures upper and lower.    Eye health  The patient does  or does not use corrective lenses. Uses readers.  Last eye exam 1-2 years ago..    Metabolic conditions  Wt Readings from Last 5 Encounters:   10/30/22 93.6 kg (206 lb 6.4 oz)   07/03/22 (!) 103 kg (227 lb)   07/02/22 (!) 103.3 kg (227 lb 12.8 oz)   03/12/22 98.3 kg (216 lb 12.8 oz)   12/26/21 (!) 103.1 kg (227 lb 6.4 oz)     Lab Results   Component Value Date    CREATININE 1.35 (H) 10/30/2022    PROTEINUA Negative 03/04/2022    PROTEINUR 7.9 12/05/2021    GLUCOSEU Negative 03/04/2022    ALBCRERAT  07/03/2022      Comment:      Unable to calculate.    PCRATIOUR 0.058 12/05/2021    GLU 102 10/30/2022    A1C 5.3 10/30/2022    ALT 27 10/30/2022    ALT 23 07/03/2022    ALT 20 05/26/2022    VITDTOTAL 23.9 07/03/2022     # Kidney health - creatinine, UA, and urine albumin:creatinine ratio today  # Bone health - total vitamin D 25(OH) level; Dexa to be ordered next visit  # Diabetes assessment - A1C 5.8 on 11.14.23  # NAFLD assessment - suspicion for NAFLD low    Communicable diseases  Lab Results   Component Value Date    QFTTBGOLD Negative 07/03/2022    HEPAIGG Reactive (A) 04/30/2018    HEPBSAB Reactive (A) 04/30/2018    HCVRNA Not Detected 07/03/2022    HCVRNAIU 1,610,960 10/31/2015    HCVIU 454098 03/08/2015    RUBEOL Positive 07/03/2022    MUMPSIGG Positive 07/03/2022    RUBIG Positive 07/03/2022     # TB screening - IGRA Negative 07.18.2023  # Hepatitis screening - HCV RNA not detected.  # MMR screening - Measles, mumps, Rubella IgG positive.    Lab Results   Component Value Date    RPR Nonreactive 07/03/2022    RPR Nonreactive 06/15/2020    CTNAA Negative 07/03/2022    CTNAA Negative 07/13/2021    CTNAA Negative 04/07/2021    GCNAA Negative 07/03/2022    GCNAA Negative 07/13/2021    GCNAA Negative 04/07/2021    SPECSOURCE Urine 07/03/2022    SPECSOURCE Urethra 07/13/2021    SPECSOURCE Urethra 04/07/2021     GC/CT NAATs - Negative 07.18.23  RPR - Non reactive 07.18.23    Cancer screening  Lab Results   Component Value Date    PSA 0.85 10/30/2022    FINALDX  01/06/2016     A: Stomach, biopsy   - Gastric fundic mucosa with chronic superficial gastritis   - Gastric antral mucosa with mildly active chronic superficial gastritis and reactive foveolar hyperplasia  - No Helicobacter pylori identified on H&E stain        # Anorectal - assessment needed but deferred to future visit  # Colorectal - referral for colonoscopy today  # Liver - assessment needed but deferred to future visit  # Lung - low-dose CT ordered  Smoked 2 packs per day since age 85 and now down to 5 cigarettes  # Prostate - PSA 0.85 on 11.14.23    Cardiovascular disease  Lab Results   Component Value Date    CHOL 145 11/14/2021    HDL 51 11/14/2021    LDL 79 11/14/2021    NONHDL 94 11/14/2021    TRIG 76 11/14/2021     # The 10-year ASCVD risk score (Arnett DK, et  al., 2019) is: 26.1%  - is taking aspirin   - is not taking statin  - BP control poor  - current smoker  # AAA screening -      - ONE-TIME ULTRASOUND FOR MEN 65-75 WHO EVER SMOKED (B)    Immunization History   Administered Date(s) Administered    COVID-19 VAC,BIVALENT,MODERNA(BLUE CAP) 11/20/2021, 07/03/2022    COVID-19 VAC,MRNA,TRIS(12Y UP)(PFIZER)(GRAY CAP) 12/15/2020, 01/05/2021, 05/04/2021    COVID-19 VACC,(JANSSEN)(PF) 06/04/2020    COVID-19 VACC,MRNA,(PFIZER)(PF) 12/15/2020    Covid-19 Vac, (53yr+) (Spikevax) Monovalent Xbb.1.5 Moder  10/30/2022    HEPATITIS B VACCINE ADULT,IM(ENERGIX B, RECOMBIVAX) 10/12/2008, 02/15/2009, 07/04/2017, 08/05/2017    Hepatitis A (Adult) 07/04/2017, 08/05/2017    INFLUENZA TIV (TRI) PF (IM) 10/12/2008, 09/30/2009, 12/05/2010, 09/02/2012    Influenza Recomb PF (Quad) Injectable(Egg Free)18+ 02/21/2017    Influenza Vaccine Quad (IIV4 PF) 78mo+ injectable 08/21/2013, 09/03/2014, 10/31/2015, 10/02/2016, 02/21/2017, 01/14/2019, 09/21/2020, 10/30/2022    Influenza Virus Vaccine, unspecified formulation 08/21/2013, 09/03/2014, 10/31/2015, 10/02/2016, 02/21/2017, 11/16/2021, 12/06/2021    Influenza, (QUAD) Intradermal PF (18-14yrs) 02/21/2017    Meningococcal C Conjugate 07/04/2017    PNEUMOCOCCAL POLYSACCHARIDE 23-VALENT 04/20/2008, 08/25/2013, 02/21/2017    PPD Test 10/12/2008, 02/15/2009, 03/08/2009, 01/03/2010, 04/11/2010, 06/19/2011, 09/02/2012    Pneumococcal Conjugate 13-Valent 12/02/2012    SHINGRIX-ZOSTER VACCINE (HZV), RECOMBINANT,SUB-UNIT,ADJUVANTED IM 12/28/2020, 07/12/2021, 08/31/2021    SMALLPOX,MONKEYPOX(PF)(JYNNEOS) 08/31/2021, 10/02/2021    TD(TDVAX),ADSORBED,2LF(IM)(PF) 05/17/2006    TdaP 04/20/2008, 02/09/2019, 07/28/2021       Immunizations today - COVID    Past Medical History:   Diagnosis Date    Allergic rhinitis     Arthritis     Bipolar disorder (CMS-HCC)     Cocaine use disorder, moderate, in early remission (CMS-HCC) 05/20/2016    Currently in remission Feb 2020    Depression     Depressive disorder     GERD (gastroesophageal reflux disease)     Hepatitis C genotype 1, treated in prison by Dr. Anne Shutter 08/2017-11/2017 (ELB/GRAZ)    HIV disease (CMS-HCC) 2005    HLD (hyperlipidemia)     Hypertension     Kidney stone     Polysubstance abuse (CMS-HCC)     Psychiatric Hospitalizations     Psychiatric Medication Trials     Psychosis (CMS-HCC)     Substance abuse (CMS-HCC)          Social History  Background -   Housing - Lives in an apartment by himself for the past 2 months.  School / Work & Benefits - Yes  Tobacco - 5 cigarettes per day; Started smoking at [employer]  Previously 2 packs per day for >20y  Alcohol - None current  Substance use - Hx of crack cocaine use. Clean since 04/2022 after a relapse. No needles.    Sexual health & secondary prevention   Having sex only with primary partner.   Parts of body used during sex include: mouth, penis, and vagina. Does not have anal sex. Duffy Rhody partner for vaginal sex.   Has a primary male partner x 37 -19 year and monogamous and she is as well    Medications and Allergies  He has a current medication list which includes the following prescription(s): albuterol, aspirin, biktarvy, bictegrav-emtricit-tenofov ala, bictegrav-emtricit-tenofov ala, finasteride, fluticasone propionate, losartan, rosuvastatin, tamsulosin, and trazodone.    Allergies: Ace inhibitors, Amlodipine, Lactose, Lisinopril, Bee pollen, Pollen extracts, and Yellow jacket venom    Family History  His family history includes Alcohol abuse in his father, son, and son; Arthritis in  his mother; Asthma in his son; Cancer in his brother and maternal grandmother; Depression in his mother; Hypertension in his mother.

## 2022-11-20 ENCOUNTER — Ambulatory Visit: Admit: 2022-11-20 | Payer: MEDICAID

## 2022-11-27 NOTE — Unmapped (Signed)
Samuel Baker 's Biktarvy shipment will be canceled  as a result of patient requested.     I have reached out to the patient  at [phone] and communicated the delay. We will not reschedule the medication and have removed this/these medication(s) from the work request.  We have canceled this work request.     Pt states he has been filling with [facility] and will continue filling with them. I told pt to reach out to [facility] if he would like to start filling here again.

## 2022-11-28 NOTE — Unmapped (Signed)
Patient called stating that he had asked to be referred to an ID clinic in [place], which is where he now lives. He said that he would keep the 1/9th appointment for now while he is waiting for a new appointment at [facility] ID.  Sent note to provider and SW.

## 2022-11-29 NOTE — Unmapped (Signed)
Specialty Medication(s): Biktarvy 50-200-25mg     Samuel Baker has been dis-enrolled from the [facility] specialty pharmacy services due to a pharmacy change. The patient is now filling at the following pharmacy:     [facility] [address] [place] - [address] [phone]     Additional information provided to the patient: n/a    Samuel Baker, PharmD  [facility] Specialty Pharmacist

## 2022-12-04 DIAGNOSIS — B20 Human immunodeficiency virus [HIV] disease: Principal | ICD-10-CM

## 2022-12-18 ENCOUNTER — Ambulatory Visit: Payer: Medicaid Other | Admitting: Infectious Diseases

## 2022-12-20 ENCOUNTER — Encounter: Payer: Self-pay | Admitting: Infectious Diseases

## 2022-12-20 ENCOUNTER — Ambulatory Visit: Payer: Medicaid Other | Attending: Infectious Diseases | Admitting: Infectious Diseases

## 2022-12-20 ENCOUNTER — Other Ambulatory Visit
Admission: RE | Admit: 2022-12-20 | Discharge: 2022-12-20 | Disposition: A | Discharge status: Home / Self Care | Payer: Medicaid Other | Attending: Infectious Diseases | Admitting: Infectious Diseases

## 2022-12-20 VITALS — BP 163/104 | HR 52 | Temp 98.6°F | Ht 69.0 in | Wt 204.0 lb

## 2022-12-20 DIAGNOSIS — I2510 Atherosclerotic heart disease of native coronary artery without angina pectoris: Secondary | ICD-10-CM | POA: Diagnosis not present

## 2022-12-20 DIAGNOSIS — I10 Essential (primary) hypertension: Secondary | ICD-10-CM | POA: Diagnosis not present

## 2022-12-20 DIAGNOSIS — B20 Human immunodeficiency virus [HIV] disease: Secondary | ICD-10-CM

## 2022-12-20 DIAGNOSIS — Z114 Encounter for screening for human immunodeficiency virus [HIV]: Secondary | ICD-10-CM | POA: Diagnosis not present

## 2022-12-20 DIAGNOSIS — F319 Bipolar disorder, unspecified: Secondary | ICD-10-CM | POA: Diagnosis not present

## 2022-12-20 DIAGNOSIS — B1920 Unspecified viral hepatitis C without hepatic coma: Secondary | ICD-10-CM | POA: Insufficient documentation

## 2022-12-20 DIAGNOSIS — J45909 Unspecified asthma, uncomplicated: Secondary | ICD-10-CM | POA: Insufficient documentation

## 2022-12-20 DIAGNOSIS — Z96641 Presence of right artificial hip joint: Secondary | ICD-10-CM | POA: Diagnosis not present

## 2022-12-20 DIAGNOSIS — Z79899 Other long term (current) drug therapy: Secondary | ICD-10-CM | POA: Diagnosis not present

## 2022-12-20 DIAGNOSIS — K219 Gastro-esophageal reflux disease without esophagitis: Secondary | ICD-10-CM | POA: Diagnosis not present

## 2022-12-20 DIAGNOSIS — F17210 Nicotine dependence, cigarettes, uncomplicated: Secondary | ICD-10-CM | POA: Diagnosis not present

## 2022-12-20 DIAGNOSIS — Z7951 Long term (current) use of inhaled steroids: Secondary | ICD-10-CM | POA: Insufficient documentation

## 2022-12-20 LAB — CBC WITH DIFFERENTIAL/PLATELET
Abs Immature Granulocytes: 0.01 10*3/uL (ref 0.00–0.07)
Basophils Absolute: 0 10*3/uL (ref 0.0–0.1)
Basophils Relative: 1 %
Eosinophils Absolute: 0 10*3/uL (ref 0.0–0.5)
Eosinophils Relative: 1 %
HCT: 43.9 % (ref 39.0–52.0)
Hemoglobin: 14.8 g/dL (ref 13.0–17.0)
Immature Granulocytes: 0 %
Lymphocytes Relative: 55 %
Lymphs Abs: 1.7 10*3/uL (ref 0.7–4.0)
MCH: 33 pg (ref 26.0–34.0)
MCHC: 33.7 g/dL (ref 30.0–36.0)
MCV: 98 fL (ref 80.0–100.0)
Monocytes Absolute: 0.3 10*3/uL (ref 0.1–1.0)
Monocytes Relative: 8 %
Neutro Abs: 1.1 10*3/uL — ABNORMAL LOW (ref 1.7–7.7)
Neutrophils Relative %: 35 %
Platelets: 201 10*3/uL (ref 150–400)
RBC: 4.48 MIL/uL (ref 4.22–5.81)
RDW: 11.8 % (ref 11.5–15.5)
WBC: 3.2 10*3/uL — ABNORMAL LOW (ref 4.0–10.5)
nRBC: 0 % (ref 0.0–0.2)

## 2022-12-20 LAB — COMPREHENSIVE METABOLIC PANEL
ALT: 21 U/L (ref 0–44)
AST: 17 U/L (ref 15–41)
Albumin: 3.9 g/dL (ref 3.5–5.0)
Alkaline Phosphatase: 69 U/L (ref 38–126)
Anion gap: 7 (ref 5–15)
BUN: 14 mg/dL (ref 6–20)
CO2: 26 mmol/L (ref 22–32)
Calcium: 8.7 mg/dL — ABNORMAL LOW (ref 8.9–10.3)
Chloride: 101 mmol/L (ref 98–111)
Creatinine, Ser: 1.19 mg/dL (ref 0.61–1.24)
GFR, Estimated: >60 mL/min (ref >60)
Glucose, Bld: 106 mg/dL — ABNORMAL HIGH (ref 70–99)
Potassium: 4.4 mmol/L (ref 3.5–5.1)
Sodium: 134 mmol/L — ABNORMAL LOW (ref 135–145)
Total Bilirubin: 0.8 mg/dL (ref 0.3–1.2)
Total Protein: 7.5 g/dL (ref 6.5–8.1)

## 2022-12-20 LAB — HEPATITIS B SURFACE ANTIGEN: Hepatitis B Surface Ag: NONREACTIVE

## 2022-12-20 NOTE — Progress Notes (Signed)
NAME: Roy [name]  DOB: 08/09/1966  MRN: [id number]  Date/Time: 12/20/2022 12:12 PM   Subjective:  Pt is here to engage in HIV care Was at [facility] and has relocated to [facility] ? Roy [name] is a 56 y.o. 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 The infection healed completely HE is on Biktarvy for HIV and was followed at [facility]- last Vl < 20 and CD4 is 700 from NOV 2023  HIV diagnosed 16 yrs ago Nadir Cd4 NK HAARt history Currently on Biktarvy Previously complera Acquired thru IVDA/sex with women Male partner who is negative No sex with men  Genotype-UK ? Past Medical History:  Diagnosis Date   AIDS (acquired immune deficiency syndrome) ([place])    Arthritis    Asthma    Bipolar disorder ([place])    Bronchitis    Complication of anesthesia    Coronary artery disease    Depression    Dysrhythmia    1st degree heart block/ brady   GERD (gastroesophageal reflux disease)    Hepatitis C    treated   HIV (human immunodeficiency virus infection) ([place])    HTN (hypertension)    PONV (postoperative nausea and vomiting)     Past Surgical History:  Procedure Laterality Date   APPLICATION OF WOUND VAC Right 09/01/2019   Procedure: APPLICATION OF WOUND VAC;  Surgeon: Hessie Knows, MD;  Location: [facility] ORS;  Service: Orthopedics;  Laterality: Right;  Serial # [id number]   HERNIA REPAIR Left    inguinal   TOE SURGERY     TOE SURGERY Right    TOTAL HIP ARTHROPLASTY Right 09/01/2019   Procedure: TOTAL HIP ARTHROPLASTY ANTERIOR APPROACH;  Surgeon: Hessie Knows, MD;  Location: [facility] ORS;  Service: Orthopedics;  Laterality: Right;    Social History   Socioeconomic History   Marital status: Divorced    Spouse name: Not on  file   Number of children: Not on file   Years of education: Not on file   Highest education level: Not on file  Occupational History   Not on file  Tobacco Use   Smoking status: Every Day    Packs/day: 0.25    Types: Cigarettes   Smokeless tobacco: Never  Vaping Use   Vaping Use: Never used  Substance and Sexual Activity   Alcohol use: Not Currently    Alcohol/week: 84.0 standard drinks of alcohol    Types: 84 Cans of beer per week    Comment: daily   Drug use: Yes    Types: Cocaine, "Crack" cocaine    Comment: 01/23/2021   Sexual activity: Yes  Other Topics Concern   Not on file  Social History Narrative   Not on file   Social Determinants of Health   Financial Resource Strain: Not on file  Food Insecurity: Not on file  Transportation Needs: Not on file  Physical Activity: Not on file  Stress: Not on file  Social Connections: Not on file  Intimate Partner Violence: Not on file    Family History  Problem Relation Age of Onset   Cancer Brother    Uterine cancer Mother    CAD Mother    Hypertension Mother    Hyperlipidemia Mother    Allergies  Allergen  Reactions   Amlodipine Swelling    Of the tongue   Lisinopril Swelling   Lactose Other (See Comments)    GI distress   Pollen Extract Other (See Comments)    Itchy eyes and runny nose   ? Current Outpatient Medications  Medication Sig Dispense Refill   albuterol (VENTOLIN HFA) 108 (90 Base) MCG/ACT inhaler Inhale 1-2 puffs into the lungs every 4 (four) hours as needed for wheezing or shortness of breath. 18 g 1   BIKTARVY 50-200-25 MG TABS tablet Take 1 tablet by mouth daily.     citalopram (CELEXA) 40 MG tablet Take 1 tablet (40 mg total) by mouth daily. 30 tablet 1   DULoxetine (CYMBALTA) 30 MG capsule Take 30 mg by mouth daily.     hydrOXYzine (ATARAX/VISTARIL) 50 MG tablet Take 1 tablet (50 mg total) by mouth 3 (three) times daily as needed. 30 tablet 0   losartan (COZAAR) 100 MG tablet Take 100 mg by  mouth daily.     mirtazapine (REMERON) 7.5 MG tablet Take 1 tablet (7.5 mg total) by mouth at bedtime. 30 tablet 1   risperiDONE (RISPERDAL) 1 MG tablet Take 1 tablet (1 mg total) by mouth at bedtime. 30 tablet 1   traZODone (DESYREL) 150 MG tablet Take 1 tablet (150 mg total) by mouth at bedtime as needed for sleep. 30 tablet 1   No current facility-administered medications for this visit.    REVIEW OF SYSTEMS:  Const: negative fever, negative chills, negative weight loss Eyes: negative diplopia or visual changes, negative eye pain ENT: negative coryza, negative sore throat Resp: negative cough, hemoptysis, dyspnea Cards: negative for chest pain, palpitations, lower extremity edema GU: negative for frequency, dysuria and hematuria Skin: negative for rash and pruritus Heme: negative for easy bruising and gum/nose bleeding MS: negative for myalgias, arthralgias, back pain and muscle weakness Neurolo:negative for headaches, dizziness, vertigo, memory problems  Psych: negative for feelings of anxiety, depression  Pertinent Positives include : Objective:  VITALS:  BP (!) 163/104   Pulse (!) 52   Temp 98.6 F (37 C) (Temporal)   Ht 5\' 9"  (1.753 m)   Wt 204 lb (92.5 kg)   BMI 30.13 kg/m  PHYSICAL EXAM:  General: Alert, cooperative, no distress, appears stated age.  Head: Normocephalic, without obvious abnormality, atraumatic. Eyes: Conjunctivae clear, anicteric sclerae. Pupils are equal Nose: Nares normal. No drainage or sinus tenderness. Throat: Lips, mucosa, and tongue normal. No Thrush edentulous Neck: Supple, symmetrical, no adenopathy, thyroid: non tender no carotid bruit and no JVD. Back: No CVA tenderness. Lungs: Clear to auscultation bilaterally. No Wheezing or Rhonchi. No rales. Heart: Regular rate and rhythm, no murmur, rub or gallop. Abdomen: Soft, non-tender,not distended. Bowel sounds normal. No masses Extremities: Extremities normal, atraumatic, no cyanosis. No  edema. No clubbing Skin: No rashes or lesions. Not Jaundiced Lymph: Cervical, supraclavicular normal. Neurologic: Grossly non-focal  Health maintenance Vaccination Immunization History  Administered Date(s) Administered  COVID-19 VAC,BIVALENT,MODERNA(BLUE CAP) 11/20/2021, 07/03/2022  COVID-19 VAC,MRNA,TRIS(12Y UP)(PFIZER)(GRAY CAP) 12/15/2020, 01/05/2021, 05/04/2021  COVID-19 VACC,(JANSSEN)(PF) 06/04/2020  COVID-19 VACC,MRNA,(PFIZER)(PF) 12/15/2020  HEPATITIS B VACCINE ADULT,IM(ENERGIX B, RECOMBIVAX) 10/12/2008, 02/15/2009, 07/04/2017, 08/05/2017  Hepatitis A 07/04/2017, 08/05/2017  INFLUENZA TIV (TRI) PF (IM) 10/12/2008, 09/30/2009, 12/05/2010, 09/02/2012  Influenza Recomb PF (Quad) Injectable(Egg Free)18+ 02/21/2017  Influenza Vaccine Quad (IIV4 PF) 53mo+ injectable 08/21/2013, 09/03/2014, 10/31/2015, 10/02/2016, 02/21/2017, 01/14/2019, 09/21/2020  Influenza Virus Vaccine, unspecified formulation 08/21/2013, 09/03/2014, 10/31/2015, 10/02/2016, 02/21/2017, 11/16/2021, 12/06/2021  Influenza, (QUAD) Intradermal PF (18-63yrs) 02/21/2017  Meningococcal C  Conjugate 07/04/2017  PNEUMOCOCCAL POLYSACCHARIDE 23 04/20/2008, 08/25/2013, 02/21/2017  PPD Test 10/12/2008, 02/15/2009, 03/08/2009, 01/03/2010, 04/11/2010, 06/19/2011, 09/02/2012  Pneumococcal Conjugate 13-Valent 12/02/2012  SHINGRIX-ZOSTER VACCINE (HZV), RECOMBINANT,SUB-UNIT,ADJUVANTED IM 12/28/2020, 07/12/2021, 08/31/2021  SMALLPOX,MONKEYPOX(PF)(JYNNEOS) 08/31/2021, 10/02/2021  TD(TDVAX),ADSORBED,2LF(IM)(PF) 05/17/2006  TdaP 04/20/2008, 02/09/2019, 07/28/2021  Vaccine Date last given comment  Influenza    Hepatitis B    Hepatitis A    Prevnar-PCV-13    Pneumovac-PPSV-23    TdaP    HPV    Shingrix ( zoster vaccine)     ______________________  Labs Lab Result  Date comment  HIV VL     CD4     Genotype     [id number]     HIV antibody     RPR     Quantiferon Gold     Hep C ab     Hepatitis B-ab,ag,c     Hepatitis A-IgM,  IgG /T     Lipid     GC/CHL     PAP     HB,PLT,Cr, LFT       Preventive  Procedure Result  Date comment  colonoscopy     Mammogram     Dental exam     Opthal       Impression/Recommendation ?HIV- is here to establish care- was in [facility] and transferring his care On biktarvy Last Vl < 20 and cd4 is 700 Will get labs today ( cbc/cmp/rpr/VL, cd4, ) Continue biktarvy, may switch to dovato because of CKD if no M184V mutation- will get genotype  HTN- on losartan  Bipolar disorder on multiple meds- remeron, cymbalta, celexa, trazadone and risperdal  Needs colonoscopy  Follow up 3 months?  Will get lipids- he will need statin  Partner notification done- steady relationship with a male partner who is negative ___________________________________________________ Discussed with patient in detail

## 2022-12-21 LAB — RPR: RPR Ser Ql: NONREACTIVE

## 2022-12-21 LAB — HIV-1 RNA QUANT-NO REFLEX-BLD
HIV 1 RNA Quant: 50 copies/mL
LOG10 HIV-1 RNA: 1.699 log10copy/mL

## 2022-12-21 LAB — AFP TUMOR MARKER: AFP, Serum, Tumor Marker: 2.1 ng/mL (ref 0.0–8.4)

## 2022-12-21 LAB — HEPATITIS B SURFACE ANTIBODY, QUANTITATIVE: Hep B S AB Quant (Post): 173 m[IU]/mL (ref >9.9)

## 2022-12-21 LAB — HEPATITIS A ANTIBODY, TOTAL: hep A Total Ab: REACTIVE — AB

## 2022-12-24 ENCOUNTER — Other Ambulatory Visit: Payer: Self-pay | Admitting: Orthopedic Surgery

## 2022-12-24 LAB — QUANTIFERON-TB GOLD PLUS (RQFGPL)
QuantiFERON Mitogen Value: >10 IU/mL
QuantiFERON Nil Value: 0.12 IU/mL
QuantiFERON TB1 Ag Value: 0.13 IU/mL
QuantiFERON TB2 Ag Value: 0.12 IU/mL

## 2022-12-24 LAB — GENOSURE PRIME (GSPRIL)

## 2022-12-24 LAB — QUANTIFERON-TB GOLD PLUS: QuantiFERON-TB Gold Plus: NEGATIVE

## 2022-12-25 ENCOUNTER — Ambulatory Visit: Admit: 2022-12-25 | Payer: MEDICAID

## 2022-12-28 ENCOUNTER — Encounter
Admission: RE | Admit: 2022-12-28 | Discharge: 2022-12-28 | Disposition: A | Discharge status: Home / Self Care | Payer: Medicaid Other | Source: Ambulatory Visit | Attending: Orthopedic Surgery | Admitting: Orthopedic Surgery

## 2022-12-28 DIAGNOSIS — Z01812 Encounter for preprocedural laboratory examination: Secondary | ICD-10-CM

## 2022-12-28 DIAGNOSIS — F1411 Cocaine abuse, in remission: Secondary | ICD-10-CM

## 2022-12-28 DIAGNOSIS — F1990 Other psychoactive substance use, unspecified, uncomplicated: Secondary | ICD-10-CM

## 2022-12-28 DIAGNOSIS — F1494 Cocaine use, unspecified with cocaine-induced mood disorder: Secondary | ICD-10-CM

## 2022-12-28 HISTORY — DX: Prediabetes: R73.03

## 2022-12-28 HISTORY — DX: Acute kidney failure, unspecified: N17.9

## 2022-12-28 HISTORY — DX: Bradycardia, unspecified: R00.1

## 2022-12-28 HISTORY — DX: Hyperlipidemia, unspecified: E78.5

## 2022-12-28 HISTORY — DX: Angioneurotic edema, initial encounter: T78.3XXA

## 2022-12-28 HISTORY — DX: Cocaine abuse, uncomplicated: F14.10

## 2022-12-28 HISTORY — DX: Pyogenic arthritis, unspecified: M00.9

## 2022-12-28 HISTORY — DX: Anogenital (venereal) warts: A63.0

## 2022-12-28 HISTORY — DX: Benign prostatic hyperplasia without lower urinary tract symptoms: N40.0

## 2022-12-28 HISTORY — DX: Supraventricular tachycardia, unspecified: I47.10

## 2022-12-28 NOTE — Patient Instructions (Addendum)
Your procedure is scheduled on:01-07-23 Monday Report to the Registration Desk on the 1st floor of the [facility].Then proceed to the 2nd floor Surgery Desk To find out your arrival time, please call [phone] between 1PM - 3PM on:01-04-23 Friday If your arrival time is 6:00 am, do not arrive prior to that time as the [facility] entrance doors do not open until 6:00 am.  REMEMBER: Instructions that are not followed completely may result in serious medical risk, up to and including death; or upon the discretion of your surgeon and anesthesiologist your surgery may need to be rescheduled.  Do not eat food after midnight the night before surgery.  No gum chewing, lozengers or hard candies.  You may however, drink CLEAR liquids up to 2 hours before you are scheduled to arrive for your surgery. Do not drink anything within 2 hours of your scheduled arrival time.  Clear liquids include: - water  - apple juice without pulp - gatorade (not RED colors) - black coffee or tea (Do NOT add milk or creamers to the coffee or tea) Do NOT drink anything that is not on this list.  In addition, your doctor has ordered for you to drink the provided  Ensure Pre-Surgery Clear Carbohydrate Drink  Drinking this carbohydrate drink up to two hours before surgery helps to reduce insulin resistance and improve patient outcomes. Please complete drinking 2 hours prior to scheduled arrival time.  TAKE THESE MEDICATIONS THE MORNING OF SURGERY WITH A SIP OF WATER: -BIKTARVY  -citalopram (CELEXA)  -DULoxetine (CYMBALTA)  -you may take your hydrOXYzine (ATARAX/VISTARIL) if needed for anxiety  Use your Albuterol Inhaler the day of surgery and bring your Albuterol Inhaler to the hospital  One week prior to surgery: Stop Anti-inflammatories (NSAIDS) such as Advil, Aleve, Ibuprofen, Motrin, Naproxen, Naprosyn and Aspirin based products such as Excedrin, Goodys Powder, BC Powder.You may however, continue to take  Tylenol if needed for pain up until the day of surgery.  Stop ANY OVER THE COUNTER supplements/Vitamins 7 days prior to surgery   No Alcohol for 24 hours before or after surgery.  No Smoking including e-cigarettes for 24 hours prior to surgery.  No chewable tobacco products for at least 6 hours prior to surgery.  No nicotine patches on the day of surgery.  Do not use any "recreational" drugs for at least a week prior to your surgery.  Please be advised that the combination of cocaine and anesthesia may have negative outcomes, up to and including death. If you test positive for cocaine, your surgery will be cancelled.  On the morning of surgery brush your teeth with toothpaste and water, you may rinse your mouth with mouthwash if you wish. Do not swallow any toothpaste or mouthwash.  Use CHG Soap as directed on instruction sheet.  Do not wear jewelry, make-up, hairpins, clips or nail polish.  Do not wear lotions, powders, or perfumes.   Do not shave body from the neck down 48 hours prior to surgery just in case you cut yourself which could leave a site for infection.  Also, freshly shaved skin may become irritated if using the CHG soap.  Contact lenses, hearing aids and dentures may not be worn into surgery.  Do not bring valuables to the hospital. [facility] is not responsible for any missing/lost belongings or valuables.   Notify your doctor if there is any change in your medical condition (cold, fever, infection).  Wear comfortable clothing (specific to your surgery type) to  the hospital.  After surgery, you can help prevent lung complications by doing breathing exercises.  Take deep breaths and cough every 1-2 hours. Your doctor may order a device called an Incentive Spirometer to help you take deep breaths. When coughing or sneezing, hold a pillow firmly against your incision with both hands. This is called "splinting." Doing this helps protect your incision. It also  decreases belly discomfort.  If you are being admitted to the hospital overnight, leave your suitcase in the car. After surgery it may be brought to your room.  If you are being discharged the day of surgery, you will not be allowed to drive home. You will need a responsible adult (18 years or older) to drive you home and stay with you that night.   If you are taking public transportation, you will need to have a responsible adult (18 years or older) with you. Please confirm with your physician that it is acceptable to use public transportation.   Please call the Pre-admissions Testing Dept. at [phone] if you have any questions about these instructions.  Surgery Visitation Policy:  Patients undergoing a surgery or procedure may have two family members or support persons with them as long as the person is not COVID-19 positive or experiencing its symptoms.   Inpatient Visitation:    Visiting hours are 7 a.m. to 8 p.m. Up to four visitors are allowed at one time in a patient room. The visitors may rotate out with other people during the day. One designated support person (adult) may remain overnight.  Due to an increase in RSV and influenza rates and associated hospitalizations, children ages 74 and under will not be able to visit patients in [facility]. Masks continue to be strongly recommended.   How to Use an Incentive Spirometer An incentive spirometer is a tool that measures how well you are filling your lungs with each breath. Learning to take long, deep breaths using this tool can help you keep your lungs clear and active. This may help to reverse or lessen your chance of developing breathing (pulmonary) problems, especially infection. You may be asked to use a spirometer: After a surgery. If you have a lung problem or a history of smoking. After a long period of time when you have been unable to move or be active. If the spirometer includes an indicator to show  the highest number that you have reached, your health care provider or respiratory therapist will help you set a goal. Keep a log of your progress as told by your health care provider. What are the risks? Breathing too quickly may cause dizziness or cause you to pass out. Take your time so you do not get dizzy or light-headed. If you are in pain, you may need to take pain medicine before doing incentive spirometry. It is harder to take a deep breath if you are having pain. How to use your incentive spirometer  Sit up on the edge of your bed or on a chair. Hold the incentive spirometer so that it is in an upright position. Before you use the spirometer, breathe out normally. Place the mouthpiece in your mouth. Make sure your lips are closed tightly around it. Breathe in slowly and as deeply as you can through your mouth, causing the piston or the ball to rise toward the top of the chamber. Hold your breath for 3-5 seconds, or for as long as possible. If the spirometer includes a coach indicator, use this  to guide you in breathing. Slow down your breathing if the indicator goes above the marked areas. Remove the mouthpiece from your mouth and breathe out normally. The piston or ball will return to the bottom of the chamber. Rest for a few seconds, then repeat the steps 10 or more times. Take your time and take a few normal breaths between deep breaths so that you do not get dizzy or light-headed. Do this every 1-2 hours when you are awake. If the spirometer includes a goal marker to show the highest number you have reached (best effort), use this as a goal to work toward during each repetition. After each set of 10 deep breaths, cough a few times. This will help to make sure that your lungs are clear. If you have an incision on your chest or abdomen from surgery, place a pillow or a rolled-up towel firmly against the incision when you cough. This can help to reduce pain while taking deep breaths and  coughing. General tips When you are able to get out of bed: Walk around often. Continue to take deep breaths and cough in order to clear your lungs. Keep using the incentive spirometer until your health care provider says it is okay to stop using it. If you have been in the hospital, you may be told to keep using the spirometer at home. Contact a health care provider if: You are having difficulty using the spirometer. You have trouble using the spirometer as often as instructed. Your pain medicine is not giving enough relief for you to use the spirometer as told. You have a fever. Get help right away if: You develop shortness of breath. You develop a cough with bloody mucus from the lungs. You have fluid or blood coming from an incision site after you cough. Summary An incentive spirometer is a tool that can help you learn to take long, deep breaths to keep your lungs clear and active. You may be asked to use a spirometer after a surgery, if you have a lung problem or a history of smoking, or if you have been inactive for a long period of time. Use your incentive spirometer as instructed every 1-2 hours while you are awake. If you have an incision on your chest or abdomen, place a pillow or a rolled-up towel firmly against your incision when you cough. This will help to reduce pain. Get help right away if you have shortness of breath, you cough up bloody mucus, or blood comes from your incision when you cough. This information is not intended to replace advice given to you by your health care provider. Make sure you discuss any questions you have with your health care provider. Document Revised: 02/22/2020 Document Reviewed: 02/22/2020 Elsevier Patient Education  [employer].

## 2023-01-01 ENCOUNTER — Inpatient Hospital Stay: Admission: RE | Admit: 2023-01-01 | Payer: Medicaid Other | Source: Ambulatory Visit

## 2023-01-07 ENCOUNTER — Encounter: Admission: RE | Payer: Self-pay | Source: Home / Self Care

## 2023-01-07 ENCOUNTER — Ambulatory Visit: Admission: RE | Admit: 2023-01-07 | Payer: Medicaid Other | Source: Home / Self Care | Admitting: Orthopedic Surgery

## 2023-01-07 SURGERY — ARTHROPLASTY, HIP, TOTAL, ANTERIOR APPROACH
Anesthesia: Choice | Site: Hip | Laterality: Left

## 2023-02-16 DIAGNOSIS — B20 Human immunodeficiency virus [HIV] disease: Principal | ICD-10-CM

## 2023-02-16 MED ORDER — BIKTARVY 50 MG-200 MG-25 MG TABLET
ORAL_TABLET | Freq: Every day | ORAL | 4 refills | 0 days
Start: 2023-02-16 — End: ?

## 2023-02-18 MED ORDER — BIKTARVY 50 MG-200 MG-25 MG TABLET
ORAL_TABLET | Freq: Every day | ORAL | 4 refills | 30 days
Start: 2023-02-18 — End: ?

## 2023-02-28 DIAGNOSIS — B20 Human immunodeficiency virus [HIV] disease: Principal | ICD-10-CM

## 2023-02-28 MED ORDER — BIKTARVY 50 MG-200 MG-25 MG TABLET
ORAL_TABLET | Freq: Every day | ORAL | 4 refills | 0 days
Start: 2023-02-28 — End: ?

## 2023-03-04 ENCOUNTER — Other Ambulatory Visit: Payer: Self-pay

## 2023-03-04 ENCOUNTER — Telehealth: Payer: Self-pay

## 2023-03-04 DIAGNOSIS — B20 Human immunodeficiency virus [HIV] disease: Secondary | ICD-10-CM

## 2023-03-04 MED ORDER — BIKTARVY 50-200-25 MG PO TABS: 1.0000 | ORAL_TABLET | ORAL | 0 refills | Status: DC

## 2023-03-04 MED ORDER — BIKTARVY 50 MG-200 MG-25 MG TABLET
ORAL_TABLET | Freq: Every day | ORAL | 4 refills | 30 days
Start: 2023-03-04 — End: ?

## 2023-03-04 NOTE — Telephone Encounter (Signed)
Patient called office stating he needs refill on his [place]. Is down to his last 5. Spoke with Dr. Delaine Lame who is okay with refill being sent in.  30 day supply sent to preferred pharmacy. Understands additional refills will be given at appointment. Leatrice Jewels, RMA

## 2023-04-02 ENCOUNTER — Other Ambulatory Visit
Admission: RE | Admit: 2023-04-02 | Discharge: 2023-04-02 | Disposition: A | Discharge status: Home / Self Care | Payer: Medicaid Other | Source: Ambulatory Visit | Attending: Infectious Diseases | Admitting: Infectious Diseases

## 2023-04-02 ENCOUNTER — Encounter: Payer: Self-pay | Admitting: Infectious Diseases

## 2023-04-02 ENCOUNTER — Ambulatory Visit: Payer: Medicaid Other | Attending: Infectious Diseases | Admitting: Infectious Diseases

## 2023-04-02 ENCOUNTER — Ambulatory Visit: Payer: Medicaid Other | Admitting: Internal Medicine

## 2023-04-02 VITALS — BP 175/114 | HR 56 | Temp 96.8°F | Ht 69.0 in | Wt 200.0 lb

## 2023-04-02 DIAGNOSIS — M25511 Pain in right shoulder: Secondary | ICD-10-CM | POA: Diagnosis not present

## 2023-04-02 DIAGNOSIS — F17210 Nicotine dependence, cigarettes, uncomplicated: Secondary | ICD-10-CM | POA: Diagnosis not present

## 2023-04-02 DIAGNOSIS — Z23 Encounter for immunization: Secondary | ICD-10-CM

## 2023-04-02 DIAGNOSIS — Z8249 Family history of ischemic heart disease and other diseases of the circulatory system: Secondary | ICD-10-CM | POA: Diagnosis not present

## 2023-04-02 DIAGNOSIS — R079 Chest pain, unspecified: Secondary | ICD-10-CM | POA: Insufficient documentation

## 2023-04-02 DIAGNOSIS — I159 Secondary hypertension, unspecified: Secondary | ICD-10-CM | POA: Insufficient documentation

## 2023-04-02 DIAGNOSIS — B20 Human immunodeficiency virus [HIV] disease: Secondary | ICD-10-CM

## 2023-04-02 DIAGNOSIS — Z79899 Other long term (current) drug therapy: Secondary | ICD-10-CM | POA: Diagnosis not present

## 2023-04-02 DIAGNOSIS — Z21 Asymptomatic human immunodeficiency virus [HIV] infection status: Secondary | ICD-10-CM | POA: Insufficient documentation

## 2023-04-02 LAB — COMPREHENSIVE METABOLIC PANEL
ALT: 21 U/L (ref 0–44)
AST: 23 U/L (ref 15–41)
Albumin: 4.4 g/dL (ref 3.5–5.0)
Alkaline Phosphatase: 82 U/L (ref 38–126)
Anion gap: 8 (ref 5–15)
BUN: 21 mg/dL — ABNORMAL HIGH (ref 6–20)
CO2: 22 mmol/L (ref 22–32)
Calcium: 9.1 mg/dL (ref 8.9–10.3)
Chloride: 104 mmol/L (ref 98–111)
Creatinine, Ser: 1.41 mg/dL — ABNORMAL HIGH (ref 0.61–1.24)
GFR, Estimated: 58 mL/min — ABNORMAL LOW (ref >60)
Glucose, Bld: 98 mg/dL (ref 70–99)
Potassium: 4 mmol/L (ref 3.5–5.1)
Sodium: 134 mmol/L — ABNORMAL LOW (ref 135–145)
Total Bilirubin: 0.8 mg/dL (ref 0.3–1.2)
Total Protein: 7.8 g/dL (ref 6.5–8.1)

## 2023-04-02 LAB — CBC WITH DIFFERENTIAL/PLATELET
Abs Immature Granulocytes: 0.01 10*3/uL (ref 0.00–0.07)
Basophils Absolute: 0 10*3/uL (ref 0.0–0.1)
Basophils Relative: 1 %
Eosinophils Absolute: 0 10*3/uL (ref 0.0–0.5)
Eosinophils Relative: 1 %
HCT: 42.9 % (ref 39.0–52.0)
Hemoglobin: 14.7 g/dL (ref 13.0–17.0)
Immature Granulocytes: 0 %
Lymphocytes Relative: 47 %
Lymphs Abs: 2 10*3/uL (ref 0.7–4.0)
MCH: 33.6 pg (ref 26.0–34.0)
MCHC: 34.3 g/dL (ref 30.0–36.0)
MCV: 98.2 fL (ref 80.0–100.0)
Monocytes Absolute: 0.4 10*3/uL (ref 0.1–1.0)
Monocytes Relative: 10 %
Neutro Abs: 1.7 10*3/uL (ref 1.7–7.7)
Neutrophils Relative %: 41 %
Platelets: 223 10*3/uL (ref 150–400)
RBC: 4.37 MIL/uL (ref 4.22–5.81)
RDW: 11.7 % (ref 11.5–15.5)
WBC: 4.2 10*3/uL (ref 4.0–10.5)
nRBC: 0 % (ref 0.0–0.2)

## 2023-04-02 LAB — CK: Total CK: 309 U/L (ref 49–397)

## 2023-04-02 MED ORDER — BIKTARVY 50-200-25 MG PO TABS: 1.0000 | ORAL_TABLET | ORAL | 6 refills | Status: DC

## 2023-04-02 NOTE — Progress Notes (Signed)
NAME: Roy [name]  DOB: 1966/04/09  MRN: [id number]  Date/Time: 04/02/2023 9:27 AM   Subjective:  Follow up visit for HIV Vl 50 from Jan 2024 Cd4 not checked in Jan Pt c/o rt shoulder pain Also has a catch left side of the chest especially when he bend to tie his shoe laces He is 100% adherent to Biktarvy Is yet to get a new PCP ? Roy [name] is a 57 y.o. 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 The infection healed completely HE is on Biktarvy for HIV and was followed at [facility]- last Vl  50 from Jan 2024 and CD4 is 700 from NOV 2023  HIV diagnosed 16 yrs ago Nadir Cd4 NK HAARt history Currently on Biktarvy Previously complera Acquired thru IVDA/sex with women Male partner who is negative No sex with men  Genotype-UK ? Past Medical History:  Diagnosis Date   AIDS (acquired immune deficiency syndrome)    Angioedema    ARF (acute renal failure)    Arthritis    Asthma    Bipolar disorder    BPH (benign prostatic hyperplasia)    Bradycardia    Bronchitis    Cocaine abuse    Coronary artery disease    Depression    Dyslipidemia    Dysrhythmia    1st degree heart block/ brady   Genital warts    GERD (gastroesophageal reflux disease)    Hepatitis C    treated   HIV (human immunodeficiency virus infection)    HTN (hypertension)    Myocardial infarction 2020   pt states stress test showed mild heart attack   Pre-diabetes    Septic hip    SVT (supraventricular tachycardia)     Past Surgical History:  Procedure Laterality Date   APPLICATION OF WOUND VAC Right 09/01/2019   Procedure: APPLICATION OF WOUND VAC;  Surgeon: Kennedy Bucker, MD;  Location: [facility] ORS;  Service: Orthopedics;  Laterality:  Right;  Serial # [id number]   HERNIA REPAIR Left    inguinal   TOE SURGERY Right    TOTAL HIP ARTHROPLASTY Right 09/01/2019   Procedure: TOTAL HIP ARTHROPLASTY ANTERIOR APPROACH;  Surgeon: Kennedy Bucker, MD;  Location: [facility] ORS;  Service: Orthopedics;  Laterality: Right;    Social History   Socioeconomic History   Marital status: Divorced    Spouse name: Not on file   Number of children: Not on file   Years of education: Not on file   Highest education level: Not on file  Occupational History   Not on file  Tobacco Use   Smoking status: Every Day    Packs/day: 0.25    Years: 40.00    Additional pack years: 0.00    Total pack years: 10.00    Types: Cigarettes   Smokeless tobacco: Never  Vaping Use   Vaping Use: Never used  Substance and Sexual Activity   Alcohol use: Yes    Comment: once weekly   Drug use: Yes    Types: Cocaine, "Crack" cocaine    Comment: per pt last used 05-2022 (stated this on 12-28-22)   Sexual activity: Yes  Other Topics Concern   Not on file  Social History Narrative   Not on file   Social  Determinants of Health   Financial Resource Strain: Not on file  Food Insecurity: Not on file  Transportation Needs: Not on file  Physical Activity: Not on file  Stress: Not on file  Social Connections: Not on file  Intimate Partner Violence: Not on file    Family History  Problem Relation Age of Onset   Cancer Brother    Uterine cancer Mother    CAD Mother    Hypertension Mother    Hyperlipidemia Mother    Allergies  Allergen Reactions   Amlodipine Swelling    Of the tongue   Lisinopril Swelling   Bee Pollen Itching and Other (See Comments)    Itchy eyes and runny nose   Lactose Other (See Comments)    GI distress   Pollen Extract Other (See Comments)    Itchy eyes and runny nose   Yellow Jacket Venom Itching   ? Current Outpatient Medications  Medication Sig Dispense Refill   albuterol (VENTOLIN HFA) 108 (90 Base) MCG/ACT inhaler Inhale  1-2 puffs into the lungs every 4 (four) hours as needed for wheezing or shortness of breath. 18 g 1   BIKTARVY 50-200-25 MG TABS tablet Take 1 tablet by mouth every morning. 30 tablet 0   losartan (COZAAR) 100 MG tablet Take 100 mg by mouth every morning.     mirtazapine (REMERON) 7.5 MG tablet Take 1 tablet (7.5 mg total) by mouth at bedtime. 30 tablet 1   risperiDONE (RISPERDAL) 1 MG tablet Take 1 tablet (1 mg total) by mouth at bedtime. 30 tablet 1   rosuvastatin (CRESTOR) 20 MG tablet Take 20 mg by mouth daily.     No current facility-administered medications for this visit.    REVIEW OF SYSTEMS:  Const: negative fever, negative chills, negative weight loss Eyes: negative diplopia or visual changes, negative eye pain ENT: negative coryza, negative sore throat Resp: negative cough, hemoptysis, dyspnea Cards: negative for chest pain, palpitations, lower extremity edema GU: negative for frequency, dysuria and hematuria Skin: negative for rash and pruritus Heme: negative for easy bruising and gum/nose bleeding MS: negative for myalgias, arthralgias, back pain and muscle weakness Neurolo:negative for headaches, dizziness, vertigo, memory problems  Psych: negative for feelings of anxiety, depression  Pertinent Positives include : Objective:  VITALS:  Ht  (1.753 m)   Wt 200 lb (90.7 kg)   BMI 29.53 kg/m  PHYSICAL EXAM:  General: Alert, cooperative, no distress, appears stated age.  Head: Normocephalic, without obvious abnormality, atraumatic. Eyes: Conjunctivae clear, anicteric sclerae. Pupils are equal Nose: Nares normal. No drainage or sinus tenderness. Throat: Lips, mucosa, and tongue normal. No Thrush edentulous Neck: Supple, symmetrical, no adenopathy, thyroid: non tender no carotid bruit and no JVD. Back: No CVA tenderness. Lungs: Clear to auscultation bilaterally. No Wheezing or Rhonchi. No rales. Heart: Regular rate and rhythm, no murmur, rub or gallop. Abdomen:  Soft, non-tender,not distended. Bowel sounds normal. No masses Extremities: Extremities normal, atraumatic, no cyanosis. No edema. No clubbing Skin: No rashes or lesions. Not Jaundiced Lymph: Cervical, supraclavicular normal. Neurologic: Grossly non-focal  Health maintenance Vaccination Immunization History  Administered Date(s) Administered  COVID-19 VAC,BIVALENT,MODERNA(BLUE CAP) 11/20/2021, 07/03/2022  COVID-19 VAC,MRNA,TRIS(12Y UP)(PFIZER)(GRAY CAP) 12/15/2020, 01/05/2021, 05/04/2021  COVID-19 VACC,(JANSSEN)(PF) 06/04/2020  COVID-19 VACC,MRNA,(PFIZER)(PF) 12/15/2020  HEPATITIS B VACCINE ADULT,IM(ENERGIX B, RECOMBIVAX) 10/12/2008, 02/15/2009, 07/04/2017, 08/05/2017  Hepatitis A 07/04/2017, 08/05/2017  INFLUENZA TIV (TRI) PF (IM) 10/12/2008, 09/30/2009, 12/05/2010, 09/02/2012  Influenza Recomb PF (Quad) Injectable(Egg Free)18+ 02/21/2017  Influenza Vaccine Quad (IIV4 PF) 69mo+ injectable  08/21/2013, 09/03/2014, 10/31/2015, 10/02/2016, 02/21/2017, 01/14/2019, 09/21/2020  Influenza Virus Vaccine, unspecified formulation 08/21/2013, 09/03/2014, 10/31/2015, 10/02/2016, 02/21/2017, 11/16/2021, 12/06/2021  Influenza, (QUAD) Intradermal PF (18-54yrs) 02/21/2017  Meningococcal C Conjugate 07/04/2017  PNEUMOCOCCAL POLYSACCHARIDE 23 04/20/2008, 08/25/2013, 02/21/2017  PPD Test 10/12/2008, 02/15/2009, 03/08/2009, 01/03/2010, 04/11/2010, 06/19/2011, 09/02/2012  Pneumococcal Conjugate 13-Valent 12/02/2012  SHINGRIX-ZOSTER VACCINE (HZV), RECOMBINANT,SUB-UNIT,ADJUVANTED IM 12/28/2020, 07/12/2021, 08/31/2021  SMALLPOX,MONKEYPOX(PF)(JYNNEOS) 08/31/2021, 10/02/2021  TD(TDVAX),ADSORBED,2LF(IM)(PF) 05/17/2006  TdaP 04/20/2008, 02/09/2019, 07/28/2021  Vaccine Date last given comment  Influenza    Hepatitis B    Hepatitis A    Prevnar-PCV-13    Pneumovac-PPSV-23    TdaP    HPV    Shingrix ( zoster vaccine)     ______________________  Labs Lab Result  Date comment  HIV VL     CD4     Genotype      [id number]     HIV antibody     RPR     Quantiferon Gold     Hep C ab     Hepatitis B-ab,ag,c     Hepatitis A-IgM, IgG /T     Lipid     GC/CHL     PAP     HB,PLT,Cr, LFT       Preventive  Procedure Result  Date comment  colonoscopy     Mammogram     Dental exam     Opthal       Impression/Recommendation ?HIV- On biktarvy Last Vl < 20 and cd4 is 700 Will get labs today ( cbc/cmp/rpr/VL, cd4, ) On Biktarvy- 100% adherent   C/o rt shoulder pain- ? Rotator cuff injury  Chest pain on the left side especially when he bends over- this is muscular or GERD/hiatus hernia- because of uncontrolled BP, smoker will get CXR and EKG and CK  HTN- on losartan   Bipolar disorder on multiple meds- remeron, cymbalta, celexa, trazadone and risperdal- says he quit taking all of those mefds  Pt is on crestor-   Needs colonoscopy  Follow up 3 months?    Partner notification done- steady relationship with a male partner who is negative ___________________________________________________ Discussed with patient in detail Pt has no PCP He missed the appt yesterday- another appt made for next monday

## 2023-04-02 NOTE — Patient Instructions (Signed)
You are here for HIV- today will do labs'you are complaining of rt shoulder pain and difficulty moving your arm above head- you can use voltaren Gel- let your PCP know when you se ethem next week You have a pain in the left side of chest especially with bending down to tie shoe lace- looks like a muscle pain- will get EKG, CXR as you are a smoker and have HTN Please follow up with the New PCP next week

## 2023-04-03 LAB — HIV-1 RNA QUANT-NO REFLEX-BLD
HIV 1 RNA Quant: 50 copies/mL
LOG10 HIV-1 RNA: 1.699 log10copy/mL

## 2023-04-03 LAB — T-HELPER CELLS CD4/CD8 %
% CD 4 Pos. Lymph.: 29 % — ABNORMAL LOW (ref 30.8–58.5)
Absolute CD 4 Helper: 580 /uL (ref 359–1519)
Basophils Absolute: 0 10*3/uL (ref 0.0–0.2)
Basos: 1 %
CD3+CD4+ Cells/CD3+CD8+ Cells Bld: 0.54 — ABNORMAL LOW (ref 0.92–3.72)
CD3+CD8+ Cells # Bld: 1084 /uL — ABNORMAL HIGH (ref 109–897)
CD3+CD8+ Cells NFr Bld: 54.2 % — ABNORMAL HIGH (ref 12.0–35.5)
EOS (ABSOLUTE): 0.1 10*3/uL (ref 0.0–0.4)
Eos: 1 %
Hematocrit: 43.5 % (ref 37.5–51.0)
Hemoglobin: 15 g/dL (ref 13.0–17.7)
Immature Grans (Abs): 0 10*3/uL (ref 0.0–0.1)
Immature Granulocytes: 0 %
Lymphocytes Absolute: 2 10*3/uL (ref 0.7–3.1)
Lymphs: 48 %
MCH: 34.4 pg — ABNORMAL HIGH (ref 26.6–33.0)
MCHC: 34.5 g/dL (ref 31.5–35.7)
MCV: 100 fL — ABNORMAL HIGH (ref 79–97)
Monocytes Absolute: 0.4 10*3/uL (ref 0.1–0.9)
Monocytes: 10 %
Neutrophils Absolute: 1.7 10*3/uL (ref 1.4–7.0)
Neutrophils: 40 %
Platelets: 223 10*3/uL (ref 150–450)
RBC: 4.36 x10E6/uL (ref 4.14–5.80)
RDW: 12 % (ref 11.6–15.4)
WBC: 4.1 10*3/uL (ref 3.4–10.8)

## 2023-04-03 LAB — RPR: RPR Ser Ql: NONREACTIVE

## 2023-04-05 ENCOUNTER — Telehealth: Payer: Self-pay

## 2023-04-05 NOTE — Telephone Encounter (Signed)
I called the patient to relay labs results. No answer and VM left for him to call back. Roy [name], CMA

## 2023-04-05 NOTE — Telephone Encounter (Signed)
-----   Message from Lynn Ito, MD sent at 04/04/2023  5:44 PM EDT ----- Let him know that his labs are okay- cr 1.42- need to sty hydrated- I had ordered CXR and EKG on 4/16 and he did not get either one of those. Can we ask him to do it Friday or by Monday so that I can check it. Thx  ----- Message ----- From: Leory Plowman, Lab In [place] Sent: 04/02/2023  11:23 AM EDT To: Lynn Ito, MD

## 2023-04-08 ENCOUNTER — Telehealth: Payer: Self-pay

## 2023-04-08 ENCOUNTER — Ambulatory Visit: Payer: Medicaid Other | Admitting: Internal Medicine

## 2023-04-08 NOTE — Telephone Encounter (Signed)
Patient advised of labs and verbalized understanding. Patient will try to get CXR and EKG done this week.  Roy [name] [name]

## 2023-04-08 NOTE — Telephone Encounter (Signed)
error 

## 2023-04-25 ENCOUNTER — Ambulatory Visit
Admission: RE | Admit: 2023-04-25 | Discharge: 2023-04-25 | Disposition: A | Discharge status: Home / Self Care | Payer: Medicaid Other | Source: Ambulatory Visit | Attending: Infectious Diseases | Admitting: Infectious Diseases

## 2023-04-25 ENCOUNTER — Telehealth: Payer: Self-pay

## 2023-04-25 ENCOUNTER — Ambulatory Visit
Admission: RE | Admit: 2023-04-25 | Discharge: 2023-04-25 | Disposition: A | Discharge status: Home / Self Care | Payer: Medicaid Other | Attending: Infectious Diseases | Admitting: Infectious Diseases

## 2023-04-25 DIAGNOSIS — I159 Secondary hypertension, unspecified: Secondary | ICD-10-CM

## 2023-04-25 DIAGNOSIS — R079 Chest pain, unspecified: Secondary | ICD-10-CM

## 2023-04-25 NOTE — Telephone Encounter (Signed)
I attempted to contact the patient to relay EKG results patient did not answer. I LVM for patient to call back. Pavlos Yon T [name]

## 2023-04-25 NOTE — Telephone Encounter (Signed)
-----   Message from Lynn Ito, MD sent at 04/25/2023  3:44 PM EDT ----- The patient had a n EKG today instead of 04/02/23 when I ordered the test. It is abnormal .Can you please check with him to see how he is doing? And why he did not get it that day? If he is stable , then I can refer him as Op to cardiology- if has chest pain, then he will have to go to ED. thx

## 2023-04-26 ENCOUNTER — Emergency Department
Admission: EM | Admit: 2023-04-26 | Discharge: 2023-04-26 | Disposition: A | Discharge status: Home / Self Care | Payer: Medicaid Other | Attending: Emergency Medicine | Admitting: Emergency Medicine

## 2023-04-26 DIAGNOSIS — K429 Umbilical hernia without obstruction or gangrene: Secondary | ICD-10-CM | POA: Insufficient documentation

## 2023-04-26 DIAGNOSIS — R1030 Lower abdominal pain, unspecified: Secondary | ICD-10-CM | POA: Diagnosis present

## 2023-04-26 LAB — CBC
HCT: 44.5 % (ref 39.0–52.0)
Hemoglobin: 14.7 g/dL (ref 13.0–17.0)
MCH: 33.3 pg (ref 26.0–34.0)
MCHC: 33 g/dL (ref 30.0–36.0)
MCV: 100.7 fL — ABNORMAL HIGH (ref 80.0–100.0)
Platelets: 208 10*3/uL (ref 150–400)
RBC: 4.42 MIL/uL (ref 4.22–5.81)
RDW: 11.9 % (ref 11.5–15.5)
WBC: 4.6 10*3/uL (ref 4.0–10.5)
nRBC: 0 % (ref 0.0–0.2)

## 2023-04-26 LAB — URINALYSIS, ROUTINE W REFLEX MICROSCOPIC
Bacteria, UA: NONE SEEN
Bilirubin Urine: NEGATIVE
Glucose, UA: NEGATIVE mg/dL
Ketones, ur: NEGATIVE mg/dL
Leukocytes,Ua: NEGATIVE
Nitrite: NEGATIVE
Protein, ur: NEGATIVE mg/dL
Specific Gravity, Urine: 1.026 (ref 1.005–1.030)
pH: 5 (ref 5.0–8.0)

## 2023-04-26 LAB — COMPREHENSIVE METABOLIC PANEL
ALT: 22 U/L (ref 0–44)
AST: 26 U/L (ref 15–41)
Albumin: 4.5 g/dL (ref 3.5–5.0)
Alkaline Phosphatase: 80 U/L (ref 38–126)
Anion gap: 13 (ref 5–15)
BUN: 27 mg/dL — ABNORMAL HIGH (ref 6–20)
CO2: 19 mmol/L — ABNORMAL LOW (ref 22–32)
Calcium: 9.2 mg/dL (ref 8.9–10.3)
Chloride: 103 mmol/L (ref 98–111)
Creatinine, Ser: 1.72 mg/dL — ABNORMAL HIGH (ref 0.61–1.24)
GFR, Estimated: 46 mL/min — ABNORMAL LOW (ref >60)
Glucose, Bld: 83 mg/dL (ref 70–99)
Potassium: 3.7 mmol/L (ref 3.5–5.1)
Sodium: 135 mmol/L (ref 135–145)
Total Bilirubin: 0.6 mg/dL (ref 0.3–1.2)
Total Protein: 7.8 g/dL (ref 6.5–8.1)

## 2023-04-26 LAB — LIPASE, BLOOD: Lipase: 38 U/L (ref 11–51)

## 2023-04-26 NOTE — Telephone Encounter (Signed)
2nd attempt to contact the patient. No answer and LVM

## 2023-04-26 NOTE — ED Triage Notes (Signed)
First nurse note: Patient arrived by EMS from work. Second hernia X3 years. Lower midline abdominal hernia and started having severe pain while at work.   C/o 10/10 pain when moving  EMS vitals: 136/83 b/p 63P 97% RA

## 2023-04-26 NOTE — ED Provider Notes (Signed)
[facility] Provider Note    Event Date/Time   First MD Initiated Contact with Patient 04/26/23 1751     (approximate)   History   Abdominal Pain   HPI  Roy [name] is a 57 y.o. male with history of umbilical hernia who presents today after umbilical hernia presented and was quite painful.  He reports he was able to stretch and push it back in and now feels quite well and has no complaints.  No history of abdominal surgery reported.     Physical Exam   Triage Vital Signs: ED Triage Vitals [04/26/23 1647]  Enc Vitals Group     BP (!) 119/92     Pulse Rate 62     Resp 17     Temp 98.5 F (36.9 C)     Temp Source Oral     SpO2 94 %     Weight 81.6 kg (180 lb)     Height      Head Circumference      Peak Flow      Pain Score 8     Pain Loc      Pain Edu?      Excl. in GC?     Most recent vital signs: Vitals:   04/26/23 1647  BP: (!) 119/92  Pulse: 62  Resp: 17  Temp: 98.5 F (36.9 C)  SpO2: 94%     General: Awake, no distress.  CV:  Good peripheral perfusion.  Resp:  Normal effort.  Abd:  No distention.  Soft, nontender, no palpable umbilical hernia at this time Other:     ED Results / Procedures / Treatments   Labs (all labs ordered are listed, but only abnormal results are displayed) Labs Reviewed  COMPREHENSIVE METABOLIC PANEL - Abnormal; Notable for the following components:      Result Value   CO2 19 (*)    BUN 27 (*)    Creatinine, Ser 1.72 (*)    GFR, Estimated 46 (*)    All other components within normal limits  CBC - Abnormal; Notable for the following components:   MCV 100.7 (*)    All other components within normal limits  URINALYSIS, ROUTINE W REFLEX MICROSCOPIC - Abnormal; Notable for the following components:   Color, Urine YELLOW (*)    APPearance CLEAR (*)    Hgb urine dipstick MODERATE (*)    All other components within normal limits  LIPASE, BLOOD      EKG     RADIOLOGY     PROCEDURES:  Critical Care performed:   Procedures   MEDICATIONS ORDERED IN ED: Medications - No data to display   IMPRESSION / MDM / ASSESSMENT AND PLAN / ED COURSE  I reviewed the triage vital signs and the nursing notes. [name] presentation is most consistent with acute illness / injury with system symptoms.  Patient with a history of umbilical hernia, presented today and was painful, question possible brief incarceration/strangulation, now quickly resolved after he was able to reduce it himself  Work reviewed and is overall reassuring, will refer to general surgery for definitive repair, return precautions discussed.        FINAL CLINICAL IMPRESSION(S) / ED DIAGNOSES   Final diagnoses:  Umbilical hernia without obstruction and without gangrene     Rx / DC Orders   ED Discharge Orders     None        Note:  This document was prepared using Dragon voice  recognition software and may include unintentional dictation errors.   Jene Every, MD 04/26/23 [phone]

## 2023-04-26 NOTE — ED Triage Notes (Signed)
Pt thinks that his abd hernia has come out while at work. Pt sts that he got nauseated and did vomit.

## 2023-04-29 ENCOUNTER — Encounter: Payer: Self-pay | Admitting: Emergency Medicine

## 2023-04-29 ENCOUNTER — Other Ambulatory Visit: Payer: Self-pay

## 2023-04-29 ENCOUNTER — Emergency Department
Admission: EM | Admit: 2023-04-29 | Discharge: 2023-04-29 | Disposition: A | Discharge status: Home / Self Care | Payer: Medicaid Other | Attending: Emergency Medicine | Admitting: Emergency Medicine

## 2023-04-29 ENCOUNTER — Emergency Department: Payer: Medicaid Other

## 2023-04-29 DIAGNOSIS — R001 Bradycardia, unspecified: Secondary | ICD-10-CM | POA: Diagnosis not present

## 2023-04-29 DIAGNOSIS — I2510 Atherosclerotic heart disease of native coronary artery without angina pectoris: Secondary | ICD-10-CM | POA: Insufficient documentation

## 2023-04-29 DIAGNOSIS — B20 Human immunodeficiency virus [HIV] disease: Secondary | ICD-10-CM | POA: Diagnosis not present

## 2023-04-29 DIAGNOSIS — J45909 Unspecified asthma, uncomplicated: Secondary | ICD-10-CM | POA: Diagnosis not present

## 2023-04-29 DIAGNOSIS — R079 Chest pain, unspecified: Secondary | ICD-10-CM | POA: Diagnosis present

## 2023-04-29 DIAGNOSIS — I10 Essential (primary) hypertension: Secondary | ICD-10-CM | POA: Diagnosis not present

## 2023-04-29 LAB — CBC
HCT: 44.4 % (ref 39.0–52.0)
Hemoglobin: 15.2 g/dL (ref 13.0–17.0)
MCH: 33.8 pg (ref 26.0–34.0)
MCHC: 34.2 g/dL (ref 30.0–36.0)
MCV: 98.7 fL (ref 80.0–100.0)
Platelets: 208 10*3/uL (ref 150–400)
RBC: 4.5 MIL/uL (ref 4.22–5.81)
RDW: 11.8 % (ref 11.5–15.5)
WBC: 3.4 10*3/uL — ABNORMAL LOW (ref 4.0–10.5)
nRBC: 0 % (ref 0.0–0.2)

## 2023-04-29 LAB — BASIC METABOLIC PANEL WITH GFR
Anion gap: 7 (ref 5–15)
BUN: 18 mg/dL (ref 6–20)
CO2: 23 mmol/L (ref 22–32)
Calcium: 9 mg/dL (ref 8.9–10.3)
Chloride: 107 mmol/L (ref 98–111)
Creatinine, Ser: 1.25 mg/dL — ABNORMAL HIGH (ref 0.61–1.24)
GFR, Estimated: >60 mL/min
Glucose, Bld: 119 mg/dL — ABNORMAL HIGH (ref 70–99)
Potassium: 3.9 mmol/L (ref 3.5–5.1)
Sodium: 137 mmol/L (ref 135–145)

## 2023-04-29 LAB — TROPONIN I (HIGH SENSITIVITY)
Troponin I (High Sensitivity): 12 ng/L
Troponin I (High Sensitivity): 13 ng/L

## 2023-04-29 NOTE — Discharge Instructions (Signed)
Your EKG was unchanged today.  Your cardiac enzymes were negative.  If you have any worsening or changing chest pain that concerns you then please return the emergency department.  Otherwise please follow-up with your cardiologist.

## 2023-04-29 NOTE — ED Provider Notes (Signed)
[facility] Provider Note    Event Date/Time   First MD Initiated Contact with Patient 04/29/23 1031     (approximate)   History   Chest Pain   HPI  Roy [name] is a 57 y.o. male with past no history of HIV who presents because of chest pain abnormal EKG.  Patient follows with infectious disease.  Tells me he had a routine EKG done on 5/9.  Patient was called today by infectious disease to see how he was doing given EKG was abnormal and he told them he was having chest pain thus they told him to come to the ER.  Patient says he started having chest pain last night describes it as a pulling sensation in the left chest that is worse when he moves around and when he bends over.  Says he has this pain off and on for a while.  Also endorses shortness of breath.  No nausea or diaphoresis.  Pain does not radiate.  No pain or swelling in his legs.  Says he has seen a cardiologist in the past but none recently.     Past Medical History:  Diagnosis Date   AIDS (acquired immune deficiency syndrome) (HCC)    Angioedema    ARF (acute renal failure) (HCC)    Arthritis    Asthma    Bipolar disorder (HCC)    BPH (benign prostatic hyperplasia)    Bradycardia    Bronchitis    Cocaine abuse (HCC)    Coronary artery disease    Depression    Dyslipidemia    Dysrhythmia    1st degree heart block/ brady   Genital warts    GERD (gastroesophageal reflux disease)    Hepatitis C    treated   HIV (human immunodeficiency virus infection) (HCC)    HTN (hypertension)    Myocardial infarction (HCC) 2020   pt states stress test showed mild heart attack   Pre-diabetes    Septic hip (HCC)    SVT (supraventricular tachycardia)     Patient Active Problem List   Diagnosis Date Noted   Cocaine-induced mood disorder (HCC) 11/13/2020   Allergic rhinitis 06/21/2020   GERD (gastroesophageal reflux disease) 06/21/2020   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   Arthritis pain, hip  05/18/2020   Status post total hip replacement, 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     Physical Exam  Triage Vital Signs: ED Triage Vitals  Enc Vitals Group     BP 04/29/23 1008 (!) 133/94     Pulse Rate 04/29/23 1008 (!) 41     Resp 04/29/23 1008 16     Temp 04/29/23 1008 98.5 F (36.9 C)     Temp Source 04/29/23 1008 Oral     SpO2 04/29/23 1008 99 %     Weight 04/29/23 1005 180 lb (81.6 kg)     Height 04/29/23 1005 5\' 9"  (1.753 m)     Head Circumference --      Peak  Flow --      Pain Score 04/29/23 1004 6     Pain Loc --      Pain Edu? --      Excl. in GC? --     Most recent vital signs: Vitals:   04/29/23 1008  BP: (!) 133/94  Pulse: (!) 41  Resp: 16  Temp: 98.5 F (36.9 C)  SpO2: 99%     General: Awake, no distress.  CV:  Good peripheral perfusion.  No edema or signs of DVT Resp:  Normal effort.  Lung sounds are clear Abd:  No distention.  Neuro:             Awake, Alert, Oriented x 3  Other:     ED Results / Procedures / Treatments  Labs (all labs ordered are listed, but only abnormal results are displayed) Labs Reviewed  BASIC METABOLIC PANEL - Abnormal; Notable for the following components:      Result Value   Glucose, Bld 119 (*)    Creatinine, Ser 1.25 (*)    All other components within normal limits  CBC - Abnormal; Notable for the following components:   WBC 3.4 (*)    All other components within normal limits  TROPONIN I (HIGH  SENSITIVITY)  TROPONIN I (HIGH SENSITIVITY)     EKG  EKG reviewed interpreted myself shows sinus bradycardia with a normal axis and intervals T wave inversion 2 3 aVF V4 V5 V6 LVH Appears similar in morphology to EKG from 5/9  RADIOLOGY I reviewed and interpreted the CXR which does not show any acute cardiopulmonary process    PROCEDURES:  Critical Care performed: No  Procedures    MEDICATIONS ORDERED IN ED: Medications - No data to display   IMPRESSION / MDM / ASSESSMENT AND PLAN / ED COURSE  I reviewed the triage vital signs and the nursing notes.                              [name] presentation is most consistent with acute presentation with potential threat to life or bodily function.  Differential diagnosis includes, but is not limited to, vasospasm, ACS, musculoskeletal pain, GI related pain, low suspicion for PE or dissection  Patient is a 57 year old male with history of HIV who presents with chest pain abnormal EKG.  Patient had routine EKG done through HIV clinic on 5/9.  They called patient today because EKG showed T wave inversions and when they asked him how he was feeling a noted chest pain this was told to come to the ER.  [name] EKG today does show T wave inversions inferiorly and V4 through V6 but this is similar to prior EKG from 5/9 and last in 05/2022.  Patient has LVH.  Tells me he started having chest pain last night that is been constant left side of his chest describes as muscle cramping worse when he bends over or moves around but is not exertional not associate with nausea diaphoresis and does not radiate.  Tells me this is similar to pain that he has had multiple times before he gets it every other day.  Patient is bradycardic with heart rates in the low 40s which he tells me is normal for him blood pressure is okay he denies any lightheadedness or presyncope.  Troponin is negative renal function is near baseline CBC otherwise reassuring.  Chest  x-ray is clear.  Ongoing pain we will repeat troponin.  I am reassured that his EKG appears similar to prior and his chest pain has been going on for some time.  If repeat troponin is negative anticipate he will be able to be discharged and follow-up with his cardiologist.  Of note patient is seeing primary care doctor today at 2 PM.  [name] repeat troponin is negative.  [name] pain-free currently.  I am reassured that this is similar to episodes of pain he has frequently and EKG is unchanged.  Will discharge.       FINAL CLINICAL IMPRESSION(S) / ED DIAGNOSES   Final diagnoses:  Chest pain, unspecified type     Rx / DC Orders   ED Discharge Orders     None        Note:  This document was prepared using Dragon voice recognition software and may include unintentional dictation errors.   Georga Hacking, MD 04/29/23 [phone]

## 2023-04-29 NOTE — ED Triage Notes (Signed)
Pt to ED for abnormal EKG and chest pain. States was told to come to ER, cannot state by who. Chest pain since 2000 last night. +shob.  Last crack use last night. Last ETOH last night Pt in NAD

## 2023-04-29 NOTE — Telephone Encounter (Signed)
Spoke with Roy [name], he states he was unable to have the EKG done last month because of his work schedule and transportation issues.   He states that he did have some chest pain and shortness of breath last night and has been feeling fatigued. He was in the ED last week for concerns of a hernia, but did not mention any cardiac concerns at that time.   He says he has a new patient appointment with a PCP this afternoon. Advised him that due to abnormal EKG and symptoms like chest pain and shortness of breath that he will need to go to the ED now and reschedule PCP appointment. Patient is in agreement with this plan.   Sandie Ano, RN

## 2023-05-06 ENCOUNTER — Ambulatory Visit: Payer: Self-pay | Admitting: Surgery

## 2023-06-16 ENCOUNTER — Emergency Department: Payer: No Typology Code available for payment source

## 2023-06-16 ENCOUNTER — Emergency Department
Admission: EM | Admit: 2023-06-16 | Discharge: 2023-06-17 | Disposition: A | Discharge status: Home / Self Care | Payer: No Typology Code available for payment source | Attending: Emergency Medicine | Admitting: Emergency Medicine

## 2023-06-16 ENCOUNTER — Other Ambulatory Visit: Payer: Self-pay

## 2023-06-16 DIAGNOSIS — F1414 Cocaine abuse with cocaine-induced mood disorder: Secondary | ICD-10-CM | POA: Diagnosis present

## 2023-06-16 DIAGNOSIS — R45851 Suicidal ideations: Secondary | ICD-10-CM | POA: Diagnosis not present

## 2023-06-16 DIAGNOSIS — F29 Unspecified psychosis not due to a substance or known physiological condition: Secondary | ICD-10-CM | POA: Diagnosis not present

## 2023-06-16 DIAGNOSIS — I10 Essential (primary) hypertension: Secondary | ICD-10-CM | POA: Diagnosis not present

## 2023-06-16 DIAGNOSIS — R079 Chest pain, unspecified: Secondary | ICD-10-CM | POA: Diagnosis present

## 2023-06-16 DIAGNOSIS — F1420 Cocaine dependence, uncomplicated: Secondary | ICD-10-CM | POA: Diagnosis not present

## 2023-06-16 DIAGNOSIS — F1490 Cocaine use, unspecified, uncomplicated: Secondary | ICD-10-CM

## 2023-06-16 DIAGNOSIS — Z21 Asymptomatic human immunodeficiency virus [HIV] infection status: Secondary | ICD-10-CM | POA: Diagnosis not present

## 2023-06-16 DIAGNOSIS — F1290 Cannabis use, unspecified, uncomplicated: Secondary | ICD-10-CM | POA: Diagnosis present

## 2023-06-16 LAB — CBC
HCT: 40.3 % (ref 39.0–52.0)
Hemoglobin: 13.8 g/dL (ref 13.0–17.0)
MCH: 34.2 pg — ABNORMAL HIGH (ref 26.0–34.0)
MCHC: 34.2 g/dL (ref 30.0–36.0)
MCV: 100 fL (ref 80.0–100.0)
Platelets: 192 10*3/uL (ref 150–400)
RBC: 4.03 MIL/uL — ABNORMAL LOW (ref 4.22–5.81)
RDW: 12.2 % (ref 11.5–15.5)
WBC: 4 10*3/uL (ref 4.0–10.5)
nRBC: 0 % (ref 0.0–0.2)

## 2023-06-16 LAB — COMPREHENSIVE METABOLIC PANEL
ALT: 29 U/L (ref 0–44)
AST: 28 U/L (ref 15–41)
Albumin: 3.8 g/dL (ref 3.5–5.0)
Alkaline Phosphatase: 73 U/L (ref 38–126)
Anion gap: 11 (ref 5–15)
BUN: 17 mg/dL (ref 6–20)
CO2: 20 mmol/L — ABNORMAL LOW (ref 22–32)
Calcium: 8.6 mg/dL — ABNORMAL LOW (ref 8.9–10.3)
Chloride: 107 mmol/L (ref 98–111)
Creatinine, Ser: 1.24 mg/dL (ref 0.61–1.24)
GFR, Estimated: >60 mL/min (ref >60)
Glucose, Bld: 107 mg/dL — ABNORMAL HIGH (ref 70–99)
Potassium: 4.3 mmol/L (ref 3.5–5.1)
Sodium: 138 mmol/L (ref 135–145)
Total Bilirubin: 0.4 mg/dL (ref 0.3–1.2)
Total Protein: 6.8 g/dL (ref 6.5–8.1)

## 2023-06-16 LAB — TROPONIN I (HIGH SENSITIVITY): Troponin I (High Sensitivity): 13 ng/L (ref <18)

## 2023-06-16 LAB — ACETAMINOPHEN LEVEL: Acetaminophen (Tylenol), Serum: <10 ug/mL — ABNORMAL LOW (ref 10–30)

## 2023-06-16 LAB — ETHANOL: Alcohol, Ethyl (B): <10 mg/dL (ref <10)

## 2023-06-16 LAB — SALICYLATE LEVEL: Salicylate Lvl: <7 mg/dL — ABNORMAL LOW (ref 7.0–30.0)

## 2023-06-16 MED ORDER — LOSARTAN POTASSIUM 50 MG PO TABS
100.0000 mg | ORAL_TABLET | ORAL | Status: DC
  Filled 2023-06-16: qty 2

## 2023-06-16 MED ORDER — DULOXETINE HCL 30 MG PO CPEP
30.0000 mg | ORAL_CAPSULE | Freq: Every day | ORAL | Status: DC
  Filled 2023-06-16: qty 1

## 2023-06-16 MED ORDER — ROSUVASTATIN CALCIUM 20 MG PO TABS
20.0000 mg | ORAL_TABLET | Freq: Every day | ORAL | Status: DC
  Filled 2023-06-16: qty 1

## 2023-06-16 MED ORDER — BICTEGRAVIR-EMTRICITAB-TENOFOV 50-200-25 MG PO TABS
1.0000 | ORAL_TABLET | Freq: Every day | ORAL | Status: DC
  Administered 2023-06-17: 1 via ORAL
  Filled 2023-06-16: qty 1

## 2023-06-16 MED ORDER — ALBUTEROL SULFATE HFA 108 (90 BASE) MCG/ACT IN AERS: 1.0000 | INHALATION_SPRAY | RESPIRATORY_TRACT | Status: DC | PRN

## 2023-06-16 NOTE — ED Notes (Signed)
Pts 2 belonging bags placed in a labeled tote within the locked psych unit at this time.

## 2023-06-16 NOTE — Consult Note (Signed)
[facility] Face-to-Face Psychiatry Consult   Reason for Consult:  Psych evaluation Referring Physician:  Dr. Larinda Buttery Patient Identification: Roy [name] MRN:  [id number] Principal Diagnosis: Suicidal thoughts Diagnosis:  Principal Problem:   Suicidal thoughts Active Problems:   Chest pain   Cocaine-induced mood disorder (HCC)   Total Time spent with patient: 45 minutes  Subjective:   " I dont feel like I be safe alone tonight, I feel so bad"  HPI:   Roy [name], 57 y.o., male patient seen  by this provider; chart reviewed and consulted with Dr. Larinda Buttery on 06/16/23.  On evaluation Roy [name] reports that he has been sober and hasn't used crack cocaine in 15 months, but that today he relapsed and has been feeling suicidal as a result.  He has a brother that is driving in from [place] to take him to rehab tomorrow.  He has his own apartment but is not able to contract for safety tonight as he doesn't trust himself alone.  Per triage note, Pt to ed from home via [facility] for CP that started today. Pt admits to drug use and ETOH and is having SI thoughts today. Pt is HIV positive. Pis caox4, in no acute distress in triage    During evaluation Roy [name] is laying in bed; he is alert/oriented x 4; calm/depressed/cooperative; and mood congruent with affect.  Patient is speaking in a clear tone at moderate volume, and normal pace; with fair eye contact.  His thought process is coherent and relevant; There is no indication that he is currently responding to internal/external stimuli or experiencing delusional thought content.  Patient endorses passive SI and is not able to contract for safety this evening, but says his brother will be here tomorrow to take him to rehab.  Patient has remained calm throughout assessment and has answered questions appropriately.   Recommendation:  Reassess and dc in the am if patient is able to contract for safety and is psychiatrically stable.    Past Psychiatric History: cocaine induced mood disorder  Risk to Self:   Risk to Others:   Prior Inpatient Therapy:   Prior Outpatient Therapy:    Past Medical History:  Past Medical History:  Diagnosis Date   AIDS (acquired immune deficiency syndrome) (HCC)    Angioedema    ARF (acute renal failure) (HCC)    Arthritis    Asthma    Bipolar disorder (HCC)    BPH (benign prostatic hyperplasia)    Bradycardia    Bronchitis    Cocaine abuse (HCC)    Coronary artery disease    Depression    Dyslipidemia    Dysrhythmia    1st degree heart block/ brady   Genital warts    GERD (gastroesophageal reflux disease)    Hepatitis C    treated   HIV (human immunodeficiency virus infection) (HCC)    HTN (hypertension)    Myocardial infarction (HCC) 2020   pt states stress test showed mild heart attack   Pre-diabetes    Septic hip (HCC)    SVT (supraventricular tachycardia)     Past Surgical History:  Procedure Laterality Date   APPLICATION OF WOUND VAC Right 09/01/2019   Procedure: APPLICATION OF WOUND VAC;  Surgeon: Kennedy Bucker, MD;  Location: [facility] ORS;  Service: Orthopedics;  Laterality: Right;  Serial # [id number]   HERNIA REPAIR Left    inguinal   TOE SURGERY Right    TOTAL HIP ARTHROPLASTY Right 09/01/2019   Procedure:  TOTAL HIP ARTHROPLASTY ANTERIOR APPROACH;  Surgeon: Kennedy Bucker, MD;  Location: [facility] ORS;  Service: Orthopedics;  Laterality: Right;   Family History:  Family History  Problem Relation Age of Onset   Cancer Brother    Uterine cancer Mother    CAD Mother    Hypertension Mother    Hyperlipidemia Mother    Family Psychiatric  History: unknown Social History:  Social History   Substance and Sexual Activity  Alcohol Use Yes   Comment: once weekly     Social History   Substance and Sexual Activity  Drug Use Yes   Types: Cocaine, "Crack" cocaine   Comment: per pt last used 05-2022 (stated this on 12-28-22)    Social History   Socioeconomic  History   Marital status: Divorced    Spouse name: Not on file   Number of children: Not on file   Years of education: Not on file   Highest education level: Not on file  Occupational History   Not on file  Tobacco Use   Smoking status: Every Day    Packs/day: 0.25    Years: 40.00    Additional pack years: 0.00    Total pack years: 10.00    Types: Cigarettes   Smokeless tobacco: Never  Vaping Use   Vaping Use: Never used  Substance and Sexual Activity   Alcohol use: Yes    Comment: once weekly   Drug use: Yes    Types: Cocaine, "Crack" cocaine    Comment: per pt last used 05-2022 (stated this on 12-28-22)   Sexual activity: Yes  Other Topics Concern   Not on file  Social History Narrative   Not on file   Social Determinants of Health   Financial Resource Strain: Not on file  Food Insecurity: Not on file  Transportation Needs: Not on file  Physical Activity: Not on file  Stress: Not on file  Social Connections: Not on file   Additional Social History:    Allergies:   Allergies  Allergen Reactions   Amlodipine Swelling    Of the tongue   Lisinopril Swelling   Bee Pollen Itching and Other (See Comments)    Itchy eyes and runny nose   Lactose Other (See Comments)    GI distress   Pollen Extract Other (See Comments)    Itchy eyes and runny nose   Yellow Jacket Venom Itching    Labs:  Results for orders placed or performed during the hospital encounter of 06/16/23 (from the past 48 hour(s))  Comprehensive metabolic panel     Status: Abnormal   Collection Time: 06/16/23  6:48 PM  Result Value Ref Range   Sodium 138 135 - 145 mmol/L   Potassium 4.3 3.5 - 5.1 mmol/L   Chloride 107 98 - 111 mmol/L   CO2 20 (L) 22 - 32 mmol/L   Glucose, Bld 107 (H) 70 - 99 mg/dL    Comment: Glucose reference range applies only to samples taken after fasting for at least 8 hours.   BUN 17 6 - 20 mg/dL   Creatinine, Ser 1.61 0.61 - 1.24 mg/dL   Calcium 8.6 (L) 8.9 - 10.3 mg/dL    Total Protein 6.8 6.5 - 8.1 g/dL   Albumin 3.8 3.5 - 5.0 g/dL   AST 28 15 - 41 U/L   ALT 29 0 - 44 U/L   Alkaline Phosphatase 73 38 - 126 U/L   Total Bilirubin 0.4 0.3 - 1.2 mg/dL   GFR,  Estimated >60 >60 mL/min    Comment: (NOTE) Calculated using the CKD-EPI Creatinine Equation (2021)    Anion gap 11 5 - 15    Comment: Performed at [facility], [address] [place], [place] [address]  Ethanol     Status: None   Collection Time: 06/16/23  6:48 PM  Result Value Ref Range   Alcohol, Ethyl (B) <10 <10 mg/dL    Comment: (NOTE) Lowest detectable limit for serum alcohol is 10 mg/dL.  For medical purposes only. Performed at [facility], [address] [place], [place] [address]   Salicylate level     Status: Abnormal   Collection Time: 06/16/23  6:48 PM  Result Value Ref Range   Salicylate Lvl <7.0 (L) 7.0 - 30.0 mg/dL    Comment: Performed at [facility], [address] [place], [place] [address]  Acetaminophen level     Status: Abnormal   Collection Time: 06/16/23  6:48 PM  Result Value Ref Range   Acetaminophen (Tylenol), Serum <10 (L) 10 - 30 ug/mL    Comment: (NOTE) Therapeutic concentrations vary significantly. A range of 10-30 ug/mL  may be an effective concentration for many patients. However, some  are best treated at concentrations outside of this range. Acetaminophen concentrations >150 ug/mL at 4 hours after ingestion  and >50 ug/mL at 12 hours after ingestion are often associated with  toxic reactions.  Performed at [facility], [address] [place], [place] [address]   cbc     Status: Abnormal   Collection Time: 06/16/23  6:48 PM  Result Value Ref Range   WBC 4.0 4.0 - 10.5 K/uL   RBC 4.03 (L) 4.22 - 5.81 MIL/uL   Hemoglobin 13.8 13.0 - 17.0 g/dL   HCT 29.5 62.1 - 30.8 %   MCV 100.0 80.0 - 100.0 fL   MCH 34.2 (H) 26.0 - 34.0 pg   MCHC 34.2 30.0 - 36.0 g/dL   RDW 65.7 84.6 - 96.2 %   Platelets 192 150 -  400 K/uL   nRBC 0.0 0.0 - 0.2 %    Comment: Performed at [facility], [address] [place], [place] [address]  Troponin I (High Sensitivity)     Status: None   Collection Time: 06/16/23  6:48 PM  Result Value Ref Range   Troponin I (High Sensitivity) 13 <18 ng/L    Comment: (NOTE) Elevated high sensitivity troponin I (hsTnI) values and significant  changes across serial measurements may suggest ACS but many other  chronic and acute conditions are known to elevate hsTnI results.  Refer to the "Links" section for chest pain algorithms and additional  guidance. Performed at [facility], [address] [place], [place] [address]     No current facility-administered medications for this encounter.   Current Outpatient Medications  Medication Sig Dispense Refill   albuterol (VENTOLIN HFA) 108 (90 Base) MCG/ACT inhaler Inhale 1-2 puffs into the lungs every 4 (four) hours as needed for wheezing or shortness of breath. 18 g 1   BIKTARVY 50-200-25 MG TABS tablet Take 1 tablet by mouth every morning. 30 tablet 6   cetirizine (ZYRTEC) 10 MG tablet Take 1 tablet by mouth daily.     DULoxetine (CYMBALTA) 30 MG capsule Take 30 mg by mouth daily.     etodolac (LODINE) 500 MG tablet Take 500 mg by mouth 2 (two) times daily.     losartan (COZAAR) 100 MG tablet Take 100 mg by  mouth every morning.     rosuvastatin (CRESTOR) 20 MG tablet Take 20 mg by mouth daily.     mirtazapine (REMERON) 7.5 MG tablet Take 1 tablet (7.5 mg total) by mouth at bedtime. (Patient not taking: Reported on 06/16/2023) 30 tablet 1   risperiDONE (RISPERDAL) 1 MG tablet Take 1 tablet (1 mg total) by mouth at bedtime. (Patient not taking: Reported on 06/16/2023) 30 tablet 1    Musculoskeletal: Strength & Muscle Tone: within normal limits Gait & Station: normal Patient leans: N/A  Psychiatric Specialty Exam:  Presentation  General Appearance:  Appropriate for Environment  Eye  Contact: Fair  Speech: Clear and Coherent  Speech Volume: Decreased  Handedness: Right   Mood and Affect  Mood: Depressed  Affect: Flat; Depressed   Thought Process  Thought Processes: Coherent  Descriptions of Associations:Intact  Orientation:Full (Time, Place and Person)  Thought Content:Logical; WDL  History of Schizophrenia/Schizoaffective disorder:No data recorded Duration of Psychotic Symptoms:No data recorded Hallucinations:Hallucinations: None  Ideas of Reference:None  Suicidal Thoughts:Suicidal Thoughts: No  Homicidal Thoughts:Homicidal Thoughts: No   Sensorium  Memory: Immediate Good  Judgment: Fair  Insight: Good   Executive Functions  Concentration: Fair  Attention Span: Fair  Recall: Fair  Fund of Knowledge: Fair  Language: Fair   Psychomotor Activity  Psychomotor Activity: Psychomotor Activity: Normal   Assets  Assets: Social Support; Transportation; Housing; Communication Skills   Sleep  Sleep: Sleep: Fair   Physical Exam: Physical Exam Vitals and nursing note reviewed.  Constitutional:      Appearance: Normal appearance.  HENT:     Head: Normocephalic and atraumatic.     Nose: Nose normal.  Eyes:     Pupils: Pupils are equal, round, and reactive to light.  Pulmonary:     Effort: Pulmonary effort is normal.  Musculoskeletal:        General: Normal range of motion.     Cervical back: Normal range of motion.  Neurological:     Mental Status: He is alert and oriented to person, place, and time.  Psychiatric:        Attention and Perception: Attention and perception normal.        Mood and Affect: Affect normal. Mood is depressed.        Speech: Speech normal.        Behavior: Behavior normal. Behavior is cooperative.        Thought Content: Thought content includes suicidal ideation. Thought content does not include homicidal ideation. Thought content does not include homicidal or suicidal plan.         Cognition and Memory: Cognition and memory normal.        Judgment: Judgment normal.    Review of Systems  Psychiatric/Behavioral:  Positive for depression, substance abuse and suicidal ideas. Negative for hallucinations and memory loss. The patient is nervous/anxious.   All other systems reviewed and are negative.  Blood pressure (!) 118/59, pulse 64, temperature 98 F (36.7 C), temperature source Oral, resp. rate 16, height 5\' 9"  (1.753 m), weight 86.2 kg, SpO2 98 %. Body mass index is 28.06 kg/m.  Treatment Plan Summary: Plan Patient is going to a rehab tomorrow in [place] once his brother comes to pick him up.  He is not contracting for safety this evening.  Disposition: Patient does not meet criteria for psychiatric inpatient admission. Refer to IOP. Discussed crisis plan, support from social network, calling 911, coming to the Emergency Department, and calling Suicide Hotline. Patient is not contracting for  safety this evening but will be going to rehab in the am  Jearld Lesch, NP 06/16/2023 10:44 PM

## 2023-06-16 NOTE — ED Notes (Signed)
TTS in speaking with patient. 

## 2023-06-16 NOTE — ED Notes (Signed)
Pt provided ginger ale upon request. Pt made aware no other food would be provided until morning. The only other drink he may have is ice water for the night. Pt stated "Okay." Trash and old meal tray disposed of properly.

## 2023-06-16 NOTE — ED Provider Notes (Signed)
[facility] Provider Note    Event Date/Time   First MD Initiated Contact with Patient 06/16/23 1919     (approximate)   History   Chief Complaint Psychiatric Evaluation (VOL)   HPI  Roy [name] is a 57 y.o. male with past medical history of hypertension, HIV, and polysubstance abuse who presents to the ED for psychiatric evaluation.  Patient reports that he developed sharp pain in the center of his chest earlier today after smoking crack cocaine last night.  Pain has been present constantly since onset and he denies any associated fever, cough, or difficulty breathing.  He states that he has been feeling increasingly depressed due to his drug use, reports some suicidal ideation but denies any specific plan.     Physical Exam   Triage Vital Signs: ED Triage Vitals  Enc Vitals Group     BP 06/16/23 1847 (!) 118/59     Pulse Rate 06/16/23 1847 64     Resp 06/16/23 1847 16     Temp 06/16/23 1847 98 F (36.7 C)     Temp Source 06/16/23 1847 Oral     SpO2 06/16/23 1847 98 %     Weight 06/16/23 1845 190 lb (86.2 kg)     Height 06/16/23 1845 5\' 9"  (1.753 m)     Head Circumference --      Peak Flow --      Pain Score 06/16/23 1844 8     Pain Loc --      Pain Edu? --      Excl. in GC? --     Most recent vital signs: Vitals:   06/16/23 1847  BP: (!) 118/59  Pulse: 64  Resp: 16  Temp: 98 F (36.7 C)  SpO2: 98%    Constitutional: Alert and oriented. Eyes: Conjunctivae are normal. Head: Atraumatic. Nose: No congestion/rhinnorhea. Mouth/Throat: Mucous membranes are moist.  Cardiovascular: Normal rate, regular rhythm. Grossly normal heart sounds.  2+ radial pulses bilaterally. Respiratory: Normal respiratory effort.  No retractions. Lungs CTAB. Gastrointestinal: Soft and nontender. No distention. Musculoskeletal: No lower extremity tenderness nor edema.  Neurologic:  Normal speech and language. No gross focal neurologic deficits are  appreciated.    ED Results / Procedures / Treatments   Labs (all labs ordered are listed, but only abnormal results are displayed) Labs Reviewed  COMPREHENSIVE METABOLIC PANEL - Abnormal; Notable for the following components:      Result Value   CO2 20 (*)    Glucose, Bld 107 (*)    Calcium 8.6 (*)    All other components within normal limits  SALICYLATE LEVEL - Abnormal; Notable for the following components:   Salicylate Lvl <7.0 (*)    All other components within normal limits  ACETAMINOPHEN LEVEL - Abnormal; Notable for the following components:   Acetaminophen (Tylenol), Serum <10 (*)    All other components within normal limits  CBC - Abnormal; Notable for the following components:   RBC 4.03 (*)    MCH 34.2 (*)    All other components within normal limits  ETHANOL  URINE DRUG SCREEN, QUALITATIVE ([facility] ONLY)  TROPONIN I (HIGH SENSITIVITY)     EKG  ED ECG REPORT I, Chesley Noon, the attending physician, personally viewed and interpreted this ECG.   Date: 06/16/2023  EKG Time: 18:48  Rate: 65  Rhythm: normal sinus rhythm  Axis: Normal  Intervals:none  ST&T Change: Inferolateral T wave inversions, similar to previous  RADIOLOGY Chest x-ray  reviewed and interpreted by me with no infiltrate, edema, or effusion.  PROCEDURES:  Critical Care performed: No  Procedures   MEDICATIONS ORDERED IN ED: Medications - No data to display   IMPRESSION / MDM / ASSESSMENT AND PLAN / ED COURSE  I reviewed the triage vital signs and the nursing notes.                              57 y.o. male with past medical history of hypertension, HIV, and polysubstance abuse who presents to the ED complaining of chest pain starting earlier today after crack cocaine use last night, also reports suicidal ideation due to drug use.  [name] presentation is most consistent with acute presentation with potential threat to life or bodily function.  Differential diagnosis includes,  but is not limited to, ACS, PE, pneumonia, pneumothorax, cocaine induced chest pain, musculoskeletal pain, GERD, suicidal ideation.  Patient nontoxic-appearing and in no acute distress, vital signs are unremarkable.  EKG shows no evidence of arrhythmia or ischemia and troponin within normal limits, low suspicion for ACS given chest pain for greater than 2 hours.  Chest x-ray is unremarkable and remainder of labs are reassuring with no significant anemia, leukocytosis, electrolyte abnormality, or AKI.  Chest pain appears to be resolving and with reassuring vital signs, doubt PE or dissection.  Patient would benefit from psychiatric evaluation given his suicidal ideation, but is currently calm and cooperative with no plan and we will maintain voluntary status.  He may be medically cleared for psychiatric disposition.  The patient has been placed in psychiatric observation due to the need to provide a safe environment for the patient while obtaining psychiatric consultation and evaluation, as well as ongoing medical and medication management to treat the patient's condition.  The patient has not been placed under full IVC at this time.      FINAL CLINICAL IMPRESSION(S) / ED DIAGNOSES   Final diagnoses:  Suicidal ideation  Cocaine use  Chest pain, unspecified type     Rx / DC Orders   ED Discharge Orders     None        Note:  This document was prepared using Dragon voice recognition software and may include unintentional dictation errors.   Chesley Noon, MD 06/16/23 2351

## 2023-06-16 NOTE — ED Notes (Signed)
To xray via wheelchair.

## 2023-06-16 NOTE — ED Notes (Signed)
[phone] Kathlene November (brother) [phone] - neighbor

## 2023-06-16 NOTE — ED Notes (Signed)
Psych NP speaking with patient 

## 2023-06-16 NOTE — ED Notes (Signed)
Patient reports he relapsed and used drugs last night and has been feeling guilty and started having suicidal thoughts.

## 2023-06-16 NOTE — ED Notes (Signed)
Pt dressed out :  Whit shirt Black hat Yellow metal earring Blue jeans Black and white shoes Cell phone Black belt White socks

## 2023-06-16 NOTE — BH Assessment (Signed)
Comprehensive Clinical Assessment (CCA) Screening, Triage and Referral Note  06/16/2023 Roy [name] [facility] [id number] Recommendations for Services/Supports/Treatments: Psych consult/disposition pending.  Roy [name] is a 57 year old, English speaking, Black male with a hx of alcohol use disorder and cocaine use disorder. Pt presents voluntarily. Per triage note: Pt to ed from home via [facility] for CP that started today. Pt admits to drug use and ETOH and is having SI thoughts today.   On assessment the pt. admitted that he drank an unknown amount of alcohol and $150 worth of crack, last night. Pt admitted that he feels guilty for relapsing as he'd been clean for 11 months. Pt reported that he'd presented to the ED due to having chest pain and thoughts of SI post substance use. Pt reported that he came to the ED as a preventative measure to not cause harm to himself, explaining that he does not feel safe alone at home. Pt was in the action stage of change. Pt explained that his brother is coming down from [place] to take him to a treatment program in [place] on 06/17/23. Pt unable to recall name of the treatment facility. [name] UDS is pending; BAL is unremarkable. Pt identified his trigger to relapse was to celebrate having 11 months of clean time. Pt was pleasant and cooperative throughout the assessment. Pt was not responding to internal/external stimuli, nor did pt. present with delusional thoughts. Pt had normal speech and was oriented x4. Pt presented with a sad mood; affect was responsive. Pt had a normal appearance. Pt denied HI/AV/H. Pt endorsed passive SI without a plan. Pt lives alone and has no access to weapons. Pt reported that he had no intent to himself, explaining that the thoughts are tied to feelings of guilt.  Chief Complaint:  Chief Complaint  Patient presents with   Psychiatric Evaluation    VOL   Visit Diagnosis: Cocaine dependence [facility])   Patient Reported Information How did you  hear about [place]? Self  What Is the Reason for Your Visit/Call Today? No data recorded How Long Has This Been Causing You Problems? No data recorded What Do You Feel Would Help You the Most Today? No data recorded  Have You Recently Had Any Thoughts About Hurting Yourself? No data recorded Are You Planning to Commit Suicide/Harm Yourself At This time? No data recorded  Have you Recently Had Thoughts About Hurting Someone [name]? No data recorded Are You Planning to Harm Someone at This Time? No data recorded Explanation: No data recorded  Have You Used Any Alcohol or Drugs in the Past 24 Hours? No data recorded How Long Ago Did You Use Drugs or Alcohol? No data recorded What Did You Use and How Much? No data recorded  Do You Currently Have a Therapist/Psychiatrist? No data recorded Name of Therapist/Psychiatrist: No data recorded  Have You Been Recently Discharged From Any Office Practice or Programs? No data recorded Explanation of Discharge From Practice/Program: No data recorded   CCA Screening Triage Referral Assessment Type of Contact: No data recorded Telemedicine Service Delivery:   Is this Initial or Reassessment?   Date Telepsych consult ordered in [facility]:    Time Telepsych consult ordered in [facility]:    Location of Assessment: No data recorded Provider Location: No data recorded   Collateral Involvement: No data recorded  Does Patient Have a Court Appointed Legal Guardian? No data recorded Name and Contact of Legal Guardian: No data recorded If Minor and Not Living with Parent(s), Who has Custody?  No data recorded Is CPS involved or ever been involved? No data recorded Is APS involved or ever been involved? No data recorded  Patient Determined To Be At Risk for Harm To Self or Others Based on Review of Patient Reported Information or Presenting Complaint? No data recorded Method: No data recorded Availability of Means: No data recorded Intent: No data recorded Notification  Required: No data recorded Additional Information for Danger to Others Potential: No data recorded Additional Comments for Danger to Others Potential: No data recorded Are There Guns or Other Weapons in Your Home? No data recorded Types of Guns/Weapons: No data recorded Are These Weapons Safely Secured?                            No data recorded Who Could Verify You Are Able To Have These Secured: No data recorded Do You Have any Outstanding Charges, Pending Court Dates, Parole/Probation? No data recorded Contacted To Inform of Risk of Harm To Self or Others: No data recorded  Does Patient Present under Involuntary Commitment? No data recorded   [place] of Residence: No data recorded  Patient Currently Receiving the Following Services: No data recorded  Determination of Need: No data recorded  Options For Referral: No data recorded  Discharge Disposition:     Madelaine Whipple R Roy [name], LCAS

## 2023-06-16 NOTE — BH Assessment (Addendum)
Per psych NP Rashaun D., pt to be reassessed and dc in the am if patient is able to contract for safety and is psychiatrically stable.

## 2023-06-16 NOTE — ED Notes (Signed)
ED provider speaking with patient

## 2023-06-16 NOTE — ED Triage Notes (Signed)
Pt to ed from home via [facility] for CP that started today. Pt admits to drug use and ETOH and is having SI thoughts today. Pt is HIV positive. Pis caox4, in no acute distress in triage.   150/100 EKG normal for ems

## 2023-06-16 NOTE — ED Notes (Signed)
Pt provided a dinner tray, fresh water, and a ginger ale per request. Pt thankful for food.

## 2023-06-17 DIAGNOSIS — R45851 Suicidal ideations: Secondary | ICD-10-CM

## 2023-06-17 DIAGNOSIS — F29 Unspecified psychosis not due to a substance or known physiological condition: Secondary | ICD-10-CM | POA: Diagnosis not present

## 2023-06-17 DIAGNOSIS — F1420 Cocaine dependence, uncomplicated: Secondary | ICD-10-CM | POA: Diagnosis not present

## 2023-06-17 DIAGNOSIS — F1414 Cocaine abuse with cocaine-induced mood disorder: Secondary | ICD-10-CM

## 2023-06-17 DIAGNOSIS — Z21 Asymptomatic human immunodeficiency virus [HIV] infection status: Secondary | ICD-10-CM | POA: Diagnosis not present

## 2023-06-17 DIAGNOSIS — I10 Essential (primary) hypertension: Secondary | ICD-10-CM | POA: Diagnosis not present

## 2023-06-17 DIAGNOSIS — R079 Chest pain, unspecified: Secondary | ICD-10-CM | POA: Diagnosis not present

## 2023-06-17 DIAGNOSIS — F1290 Cannabis use, unspecified, uncomplicated: Secondary | ICD-10-CM | POA: Diagnosis present

## 2023-06-17 NOTE — ED Provider Notes (Signed)
Emergency Medicine Observation Re-evaluation Note  Roy [name] is a 57 y.o. male, seen on rounds today.  Pt initially presented to the ED for complaints of Psychiatric Evaluation (VOL) Currently, the patient is resting.  Physical Exam  BP (!) 118/59   Pulse 64   Temp 98 F (36.7 C) (Oral)   Resp 16   Ht 1.753 m (5\' 9" )   Wt 86.2 kg   SpO2 98%   BMI 28.06 kg/m  Physical Exam Gen:  No acute distress Resp:  Breathing easily and comfortably, no accessory muscle usage Neuro:  Moving all four extremities, no gross focal neuro deficits Psych:  Resting currently, calm when awake  ED Course / MDM  EKG:   I have reviewed the labs performed to date as well as medications administered while in observation.  Recent changes in the last 24 hours include initial EDP evaluation and psychiatry assessment.  Plan  Current plan is for psychiatry reassessment this morning to determine appropriate disposition plan    Loleta Rose, MD 06/17/23 [phone]

## 2023-06-17 NOTE — Consult Note (Signed)
[facility] Face-to-Face Psychiatry Consult   Reason for Consult: suicidal ideations, cocaine abuse Referring Physician:  Chesley Noon, ED Provider Patient Identification: Roy [name] MRN:  [id number] Principal Diagnosis: Suicidal thoughts Diagnosis:  Principal Problem:   Suicidal thoughts Active Problems:   Chest pain   Cocaine abuse with cocaine-induced mood disorder (HCC)   Total Time spent with patient: 30 minutes  Subjective:   Roy [name] is a 57 y.o. male patient admitted with cocaine abuse and suicidal ideations.  HPI: Patient is a 57 y/o male presenting to the ED yesterday for cocaine abuse and suicidal ideations. Patient states he was part of the [facility] drug program in [place] until a recent arrest from a warrant out for him which kicked him out of the program. After program dismissal, patient states he started using cocaine again ($150 worth) alone in one sitting. Patient states he has also been drinking a couple of beers every other day. Patient claims he has had sobriety for 11 months and this is his first relapse. Patient arrived to [facility] by ambulance. 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   Patient states he lives alone in an apartment in [place]. Patient expresses his brother is commuting from [place] today to take him to another substance abuse program in [place], [place] due to not being allowed back in [place]. Patient is on disability due to HIV, severe chronic arthritis, and bronchitis. Receives HIV, HTN, and pain medications from [facility].   Patient denies suicidal ideations or homicidal ideations. Patient denies AVH and paranoia.   Past Psychiatric History: Cocaine abuse w/ cocaine-induced mood disorder, suicidal thoughts; Patient has a history of 1 suicidal attempt where he took a bottle of HIV medication 4-5 years ago.  Risk to Self:  No Risk to Others:  No Prior Inpatient  Therapy:  Several admissions to [facility] Prior Outpatient Therapy:  [facility] in [place]  Past Medical History:  Past Medical History:  Diagnosis Date   AIDS (acquired immune deficiency syndrome) (HCC)    Angioedema    ARF (acute renal failure) (HCC)    Arthritis    Asthma    Bipolar disorder (HCC)    BPH (benign prostatic hyperplasia)    Bradycardia    Bronchitis    Cocaine abuse (HCC)    Coronary artery disease    Depression    Dyslipidemia    Dysrhythmia    1st degree heart block/ brady   Genital warts    GERD (gastroesophageal reflux disease)    Hepatitis C    treated   HIV (human immunodeficiency virus infection) (HCC)    HTN (hypertension)    Myocardial infarction (HCC) 2020   pt states stress test showed mild heart attack   Pre-diabetes    Septic hip (HCC)    SVT (supraventricular tachycardia)     Past Surgical History:  Procedure Laterality Date   APPLICATION OF WOUND VAC Right 09/01/2019   Procedure: APPLICATION OF WOUND VAC;  Surgeon: Kennedy Bucker, MD;  Location: [facility] ORS;  Service: Orthopedics;  Laterality: Right;  Serial # [id number]   HERNIA REPAIR Left    inguinal   TOE SURGERY Right    TOTAL HIP ARTHROPLASTY Right 09/01/2019   Procedure: TOTAL HIP ARTHROPLASTY ANTERIOR APPROACH;  Surgeon: Kennedy Bucker, MD;  Location: [facility] ORS;  Service: Orthopedics;  Laterality: Right;   Family History:  Family History  Problem Relation  Age of Onset   Cancer Brother    Uterine cancer Mother    CAD Mother    Hypertension Mother    Hyperlipidemia Mother    Family Psychiatric  History: Did not obtain Social History:  Social History   Substance and Sexual Activity  Alcohol Use Yes   Comment: once weekly     Social History   Substance and Sexual Activity  Drug Use Yes   Types: Cocaine, "Crack" cocaine   Comment: per pt last used 05-2022 (stated this on 12-28-22)    Social History   Socioeconomic History   Marital status: Divorced    Spouse  name: Not on file   Number of children: Not on file   Years of education: Not on file   Highest education level: Not on file  Occupational History   Not on file  Tobacco Use   Smoking status: Every Day    Packs/day: 0.25    Years: 40.00    Additional pack years: 0.00    Total pack years: 10.00    Types: Cigarettes   Smokeless tobacco: Never  Vaping Use   Vaping Use: Never used  Substance and Sexual Activity   Alcohol use: Yes    Comment: once weekly   Drug use: Yes    Types: Cocaine, "Crack" cocaine    Comment: per pt last used 05-2022 (stated this on 12-28-22)   Sexual activity: Yes  Other Topics Concern   Not on file  Social History Narrative   Not on file   Social Determinants of Health   Financial Resource Strain: Not on file  Food Insecurity: Not on file  Transportation Needs: Not on file  Physical Activity: Not on file  Stress: Not on file  Social Connections: Not on file   Additional Social History:    Allergies:   Allergies  Allergen Reactions   Amlodipine Swelling    Of the tongue   Lisinopril Swelling   Bee Pollen Itching and Other (See Comments)    Itchy eyes and runny nose   Lactose Other (See Comments)    GI distress   Pollen Extract Other (See Comments)    Itchy eyes and runny nose   Yellow Jacket Venom Itching    Labs:  Results for orders placed or performed during the hospital encounter of 06/16/23 (from the past 48 hour(s))  Comprehensive metabolic panel     Status: Abnormal   Collection Time: 06/16/23  6:48 PM  Result Value Ref Range   Sodium 138 135 - 145 mmol/L   Potassium 4.3 3.5 - 5.1 mmol/L   Chloride 107 98 - 111 mmol/L   CO2 20 (L) 22 - 32 mmol/L   Glucose, Bld 107 (H) 70 - 99 mg/dL    Comment: Glucose reference range applies only to samples taken after fasting for at least 8 hours.   BUN 17 6 - 20 mg/dL   Creatinine, Ser 2.95 0.61 - 1.24 mg/dL   Calcium 8.6 (L) 8.9 - 10.3 mg/dL   Total Protein 6.8 6.5 - 8.1 g/dL   Albumin  3.8 3.5 - 5.0 g/dL   AST 28 15 - 41 U/L   ALT 29 0 - 44 U/L   Alkaline Phosphatase 73 38 - 126 U/L   Total Bilirubin 0.4 0.3 - 1.2 mg/dL   GFR, Estimated >62 >13 mL/min    Comment: (NOTE) Calculated using the CKD-EPI Creatinine Equation (2021)    Anion gap 11 5 - 15  Comment: Performed at [facility], [address] [place], [place] [address]  Ethanol     Status: None   Collection Time: 06/16/23  6:48 PM  Result Value Ref Range   Alcohol, Ethyl (B) <10 <10 mg/dL    Comment: (NOTE) Lowest detectable limit for serum alcohol is 10 mg/dL.  For medical purposes only. Performed at [facility], [address] [place], [place] [address]   Salicylate level     Status: Abnormal   Collection Time: 06/16/23  6:48 PM  Result Value Ref Range   Salicylate Lvl <7.0 (L) 7.0 - 30.0 mg/dL    Comment: Performed at [facility], [address] [place], [place] [address]  Acetaminophen level     Status: Abnormal   Collection Time: 06/16/23  6:48 PM  Result Value Ref Range   Acetaminophen (Tylenol), Serum <10 (L) 10 - 30 ug/mL    Comment: (NOTE) Therapeutic concentrations vary significantly. A range of 10-30 ug/mL  may be an effective concentration for many patients. However, some  are best treated at concentrations outside of this range. Acetaminophen concentrations >150 ug/mL at 4 hours after ingestion  and >50 ug/mL at 12 hours after ingestion are often associated with  toxic reactions.  Performed at [facility], [address] [place], [place] [address]   cbc     Status: Abnormal   Collection Time: 06/16/23  6:48 PM  Result Value Ref Range   WBC 4.0 4.0 - 10.5 K/uL   RBC 4.03 (L) 4.22 - 5.81 MIL/uL   Hemoglobin 13.8 13.0 - 17.0 g/dL   HCT 13.2 44.0 - 10.2 %   MCV 100.0 80.0 - 100.0 fL   MCH 34.2 (H) 26.0 - 34.0 pg   MCHC 34.2 30.0 - 36.0 g/dL   RDW 72.5 36.6 - 44.0 %   Platelets 192 150 - 400 K/uL   nRBC 0.0 0.0 - 0.2 %    Comment:  Performed at [facility], [address], [place], [place] [address]  Troponin I (High Sensitivity)     Status: None   Collection Time: 06/16/23  6:48 PM  Result Value Ref Range   Troponin I (High Sensitivity) 13 <18 ng/L    Comment: (NOTE) Elevated high sensitivity troponin I (hsTnI) values and significant  changes across serial measurements may suggest ACS but many other  chronic and acute conditions are known to elevate hsTnI results.  Refer to the "Links" section for chest pain algorithms and additional  guidance. Performed at [facility], [address], [place], [place] [address]     Current Facility-Administered Medications  Medication Dose Route Frequency Provider Last Rate Last Admin   albuterol (VENTOLIN HFA) 108 (90 Base) MCG/ACT inhaler 1-2 puff  1-2 puff Inhalation Q4H PRN Chesley Noon, MD       bictegravir-emtricitabine-tenofovir AF (BIKTARVY) 50-200-25 MG per tablet 1 tablet  1 tablet Oral Daily Chesley Noon, MD       DULoxetine (CYMBALTA) DR capsule 30 mg  30 mg Oral Daily Chesley Noon, MD       losartan (COZAAR) tablet 100 mg  100 mg Oral Dimas Chyle, Leonette Most, MD       rosuvastatin (CRESTOR) tablet 20 mg  20 mg Oral Daily Chesley Noon, MD       Current Outpatient Medications  Medication Sig Dispense Refill   albuterol (VENTOLIN HFA) 108 (90 Base) MCG/ACT inhaler Inhale 1-2 puffs into the lungs every 4 (four) hours as needed for  wheezing or shortness of breath. 18 g 1   BIKTARVY 50-200-25 MG TABS tablet Take 1 tablet by mouth every morning. 30 tablet 6   cetirizine (ZYRTEC) 10 MG tablet Take 1 tablet by mouth daily.     DULoxetine (CYMBALTA) 30 MG capsule Take 30 mg by mouth daily.     etodolac (LODINE) 500 MG tablet Take 500 mg by mouth 2 (two) times daily.     losartan (COZAAR) 100 MG tablet Take 100 mg by mouth every morning.     rosuvastatin (CRESTOR) 20 MG tablet Take 20 mg by mouth daily.     mirtazapine (REMERON) 7.5 MG  tablet Take 1 tablet (7.5 mg total) by mouth at bedtime. (Patient not taking: Reported on 06/16/2023) 30 tablet 1   risperiDONE (RISPERDAL) 1 MG tablet Take 1 tablet (1 mg total) by mouth at bedtime. (Patient not taking: Reported on 06/16/2023) 30 tablet 1    Musculoskeletal: Strength & Muscle Tone: within normal limits Gait & Station: normal, Not assessed Patient leans: N/A            Psychiatric Specialty Exam:  Presentation  General Appearance:  Fairly Groomed  Eye Contact: Good  Speech: Clear and Coherent; Normal Rate  Speech Volume: Normal  Handedness: Right   Mood and Affect  Mood: Euthymic  Affect: Appropriate   Thought Process  Thought Processes: Coherent; Goal Directed; Linear  Descriptions of Associations:Intact  Orientation:Full (Time, Place and Person)  Thought Content:Abstract Reasoning; Logical  History of Schizophrenia/Schizoaffective disorder:No data recorded Duration of Psychotic Symptoms:No data recorded Hallucinations:Hallucinations: None  Ideas of Reference:None  Suicidal Thoughts:Suicidal Thoughts: No  Homicidal Thoughts:Homicidal Thoughts: No   Sensorium  Memory: Immediate Fair  Judgment: Good  Insight: Good   Executive Functions  Concentration: Good  Attention Span: Good  Recall: Fair  Fund of Knowledge: Fair  Language: Good   Psychomotor Activity  Psychomotor Activity: Psychomotor Activity: Normal   Assets  Assets: Desire for Improvement; Communication Skills; Housing; Physical Health; Leisure Time; Resilience; Social Support; Transportation (using taxi to get home, then brother will transport to [employer])   Sleep  Sleep: Sleep: Fair   Physical Exam: Physical Exam Vitals and nursing note reviewed.  Constitutional:      Appearance: Normal appearance.  HENT:     Head: Normocephalic and atraumatic.  Eyes:     Conjunctiva/sclera: Conjunctivae normal.  Cardiovascular:     Rate and  Rhythm: Normal rate.  Pulmonary:     Effort: Pulmonary effort is normal.  Musculoskeletal:     Cervical back: Normal range of motion.  Neurological:     Mental Status: He is alert and oriented to person, place, and time. Mental status is at baseline.  Psychiatric:        Mood and Affect: Mood normal.        Behavior: Behavior normal.        Thought Content: Thought content normal.        Judgment: Judgment normal.    Review of Systems  Psychiatric/Behavioral:  Positive for substance abuse. Negative for hallucinations and suicidal ideas. The patient is not nervous/anxious.   All other systems reviewed and are negative.  Blood pressure (!) 118/59, pulse 64, temperature 98 F (36.7 C), temperature source Oral, resp. rate 16, height 5\' 9"  (1.753 m), weight 86.2 kg, SpO2 98 %. Body mass index is 28.06 kg/m.  Treatment Plan Summary: Plan Patient is medically cleared by psychiatric team for discharge. Patient will receive taxi voucher to go home and  pack his belongings before receiving transport by brother to substance abuse facility in [place]. Continue at-home medications.   Disposition: No evidence of imminent risk to self or others at present.   Patient does not meet criteria for psychiatric inpatient admission. Discussed crisis plan, support from social network, calling 911, coming to the Emergency Department, and calling Suicide Hotline.  [address] [place], [place] 06/17/2023 8:47 AM

## 2023-06-17 NOTE — ED Notes (Signed)
NP at the bedside for pt re-evaluation  

## 2023-06-17 NOTE — Consult Note (Signed)
[facility] Face-to-Face Psychiatry Consult   Reason for Consult:  Admit Referring Physician:  Dr. Larinda Buttery Patient Identification: Roy [name] MRN:  [id number] Principal Diagnosis: Suicidal thoughts Diagnosis:  Principal Problem:   Suicidal thoughts Active Problems:   Chest pain   Cocaine abuse with cocaine-induced mood disorder (HCC)  Total Time spent with patient: 45 minutes  Subjective:   Or Roy [name] is a 57 y.o. male w/ history of HIV, MDD, presenting to [facility] on 06/16/23 by EMS for CP that started yesterday, substance use, and SI.   HPI:    Pt seen at bedside. PA student is also present for assessment. Pt reports history of crack/cocaine and alcohol use. 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 He states he has been living in [place] since discharge from [facility]. He has been sober for about 11 months until he relapsed the night before last night. He reports using $150 of crack/cocaine and "couple beers" on his own. He reports primary substance of choice is crack/cocaine. Pt denies use of marijuana, fentanyl, heroin, or methamphetamines. No UDS currently available for review.   Pt denies suicidal, homicidal ideations. He denies auditory visual hallucinations or paranoia.  Reports feeling "better" today and requesting discharge today. His brother is on the way from [place] to pick him up and bring him to [facility] for substance use treatment. Pt states he also receives his disability check today. Reports receiving disability for "HIV, arthritis, metal hip, pins".  He denies history of NSSI. He reports 1 SA "4 or 5  years ago", OD on HIV medications. He reports several inpatient psychiatric hospitalizations "downstairs". From my chart review, last inpatient psychiatric hospitalization at [facility] was in 2021.   Past Psychiatric History: MDD  Risk to Self: No Risk  to Others: No Prior Inpatient Therapy: Yes Prior Outpatient Therapy: Yes  Past Medical History:  Past Medical History:  Diagnosis Date   AIDS (acquired immune deficiency syndrome) (HCC)    Angioedema    ARF (acute renal failure) (HCC)    Arthritis    Asthma    Bipolar disorder (HCC)    BPH (benign prostatic hyperplasia)    Bradycardia    Bronchitis    Cocaine abuse (HCC)    Coronary artery disease    Depression    Dyslipidemia    Dysrhythmia    1st degree heart block/ brady   Genital warts    GERD (gastroesophageal reflux disease)    Hepatitis C    treated   HIV (human immunodeficiency virus infection) (HCC)    HTN (hypertension)    Myocardial infarction (HCC) 2020   pt states stress test showed mild heart attack   Pre-diabetes    Septic hip (HCC)    SVT (supraventricular tachycardia)     Past Surgical History:  Procedure Laterality Date   APPLICATION OF WOUND VAC Right 09/01/2019   Procedure: APPLICATION OF WOUND VAC;  Surgeon: Kennedy Bucker, MD;  Location: [facility] ORS;  Service: Orthopedics;  Laterality: Right;  Serial # [id number]   HERNIA REPAIR Left    inguinal   TOE SURGERY Right    TOTAL HIP ARTHROPLASTY Right 09/01/2019   Procedure: TOTAL HIP ARTHROPLASTY ANTERIOR APPROACH;  Surgeon: Kennedy Bucker, MD;  Location: [facility] ORS;  Service: Orthopedics;  Laterality: Right;   Family History:  Family History  Problem Relation Age of Onset   Cancer Brother  Uterine cancer Mother    CAD Mother    Hypertension Mother    Hyperlipidemia Mother    Family Psychiatric  History: None reported during assessment Social History:  Social History   Substance and Sexual Activity  Alcohol Use Yes   Comment: once weekly     Social History   Substance and Sexual Activity  Drug Use Yes   Types: Cocaine, "Crack" cocaine   Comment: per pt last used 05-2022 (stated this on 12-28-22)    Social History   Socioeconomic History   Marital status: Divorced    Spouse name: Not on  file   Number of children: Not on file   Years of education: Not on file   Highest education level: Not on file  Occupational History   Not on file  Tobacco Use   Smoking status: Every Day    Packs/day: 0.25    Years: 40.00    Additional pack years: 0.00    Total pack years: 10.00    Types: Cigarettes   Smokeless tobacco: Never  Vaping Use   Vaping Use: Never used  Substance and Sexual Activity   Alcohol use: Yes    Comment: once weekly   Drug use: Yes    Types: Cocaine, "Crack" cocaine    Comment: per pt last used 05-2022 (stated this on 12-28-22)   Sexual activity: Yes  Other Topics Concern   Not on file  Social History Narrative   Not on file   Social Determinants of Health   Financial Resource Strain: Not on file  Food Insecurity: Not on file  Transportation Needs: Not on file  Physical Activity: Not on file  Stress: Not on file  Social Connections: Not on file   Additional Social History:    Allergies:   Allergies  Allergen Reactions   Amlodipine Swelling    Of the tongue   Lisinopril Swelling   Bee Pollen Itching and Other (See Comments)    Itchy eyes and runny nose   Lactose Other (See Comments)    GI distress   Pollen Extract Other (See Comments)    Itchy eyes and runny nose   Yellow Jacket Venom Itching    Labs:  Results for orders placed or performed during the hospital encounter of 06/16/23 (from the past 48 hour(s))  Comprehensive metabolic panel     Status: Abnormal   Collection Time: 06/16/23  6:48 PM  Result Value Ref Range   Sodium 138 135 - 145 mmol/L   Potassium 4.3 3.5 - 5.1 mmol/L   Chloride 107 98 - 111 mmol/L   CO2 20 (L) 22 - 32 mmol/L   Glucose, Bld 107 (H) 70 - 99 mg/dL    Comment: Glucose reference range applies only to samples taken after fasting for at least 8 hours.   BUN 17 6 - 20 mg/dL   Creatinine, Ser 1.61 0.61 - 1.24 mg/dL   Calcium 8.6 (L) 8.9 - 10.3 mg/dL   Total Protein 6.8 6.5 - 8.1 g/dL   Albumin 3.8 3.5 - 5.0  g/dL   AST 28 15 - 41 U/L   ALT 29 0 - 44 U/L   Alkaline Phosphatase 73 38 - 126 U/L   Total Bilirubin 0.4 0.3 - 1.2 mg/dL   GFR, Estimated >09 >60 mL/min    Comment: (NOTE) Calculated using the CKD-EPI Creatinine Equation (2021)    Anion gap 11 5 - 15    Comment: Performed at [facility], [address] [place], [place] [address]  Ethanol     Status: None   Collection Time: 06/16/23  6:48 PM  Result Value Ref Range   Alcohol, Ethyl (B) <10 <10 mg/dL    Comment: (NOTE) Lowest detectable limit for serum alcohol is 10 mg/dL.  For medical purposes only. Performed at [facility], [address] [place], [place] [address]   Salicylate level     Status: Abnormal   Collection Time: 06/16/23  6:48 PM  Result Value Ref Range   Salicylate Lvl <7.0 (L) 7.0 - 30.0 mg/dL    Comment: Performed at [facility], [address] [place], [place] [address]  Acetaminophen level     Status: Abnormal   Collection Time: 06/16/23  6:48 PM  Result Value Ref Range   Acetaminophen (Tylenol), Serum <10 (L) 10 - 30 ug/mL    Comment: (NOTE) Therapeutic concentrations vary significantly. A range of 10-30 ug/mL  may be an effective concentration for many patients. However, some  are best treated at concentrations outside of this range. Acetaminophen concentrations >150 ug/mL at 4 hours after ingestion  and >50 ug/mL at 12 hours after ingestion are often associated with  toxic reactions.  Performed at [facility], [address] [place], [place] [address]   cbc     Status: Abnormal   Collection Time: 06/16/23  6:48 PM  Result Value Ref Range   WBC 4.0 4.0 - 10.5 K/uL   RBC 4.03 (L) 4.22 - 5.81 MIL/uL   Hemoglobin 13.8 13.0 - 17.0 g/dL   HCT 29.5 62.1 - 30.8 %   MCV 100.0 80.0 - 100.0 fL   MCH 34.2 (H) 26.0 - 34.0 pg   MCHC 34.2 30.0 - 36.0 g/dL   RDW 65.7 84.6 - 96.2 %   Platelets 192 150 - 400 K/uL   nRBC 0.0 0.0 - 0.2 %    Comment: Performed at  [facility], [address], [place], [place] [address]  Troponin I (High Sensitivity)     Status: None   Collection Time: 06/16/23  6:48 PM  Result Value Ref Range   Troponin I (High Sensitivity) 13 <18 ng/L    Comment: (NOTE) Elevated high sensitivity troponin I (hsTnI) values and significant  changes across serial measurements may suggest ACS but many other  chronic and acute conditions are known to elevate hsTnI results.  Refer to the "Links" section for chest pain algorithms and additional  guidance. Performed at [facility], [address] [place], [place] [address]     Current Facility-Administered Medications  Medication Dose Route Frequency Provider Last Rate Last Admin   albuterol (VENTOLIN HFA) 108 (90 Base) MCG/ACT inhaler 1-2 puff  1-2 puff Inhalation Q4H PRN Chesley Noon, MD       bictegravir-emtricitabine-tenofovir AF (BIKTARVY) 50-200-25 MG per tablet 1 tablet  1 tablet Oral Daily Chesley Noon, MD       DULoxetine (CYMBALTA) DR capsule 30 mg  30 mg Oral Daily Chesley Noon, MD       losartan (COZAAR) tablet 100 mg  100 mg Oral Dimas Chyle, Leonette Most, MD       rosuvastatin (CRESTOR) tablet 20 mg  20 mg Oral Daily Chesley Noon, MD       Current Outpatient Medications  Medication Sig Dispense Refill   albuterol (VENTOLIN HFA) 108 (90 Base) MCG/ACT inhaler Inhale 1-2 puffs into the lungs every 4 (four) hours as needed for wheezing or shortness of breath. 18 g 1  BIKTARVY 50-200-25 MG TABS tablet Take 1 tablet by mouth every morning. 30 tablet 6   cetirizine (ZYRTEC) 10 MG tablet Take 1 tablet by mouth daily.     DULoxetine (CYMBALTA) 30 MG capsule Take 30 mg by mouth daily.     etodolac (LODINE) 500 MG tablet Take 500 mg by mouth 2 (two) times daily.     losartan (COZAAR) 100 MG tablet Take 100 mg by mouth every morning.     rosuvastatin (CRESTOR) 20 MG tablet Take 20 mg by mouth daily.     mirtazapine (REMERON) 7.5 MG tablet Take 1  tablet (7.5 mg total) by mouth at bedtime. (Patient not taking: Reported on 06/16/2023) 30 tablet 1   risperiDONE (RISPERDAL) 1 MG tablet Take 1 tablet (1 mg total) by mouth at bedtime. (Patient not taking: Reported on 06/16/2023) 30 tablet 1    Musculoskeletal: Strength & Muscle Tone:  patient sitting on assessment Gait & Station:  patient sitting on assessment Patient leans:  patient sitting on assessment  Psychiatric Specialty Exam:  Presentation  General Appearance:  Appropriate for Environment; Fairly Groomed; Other (comment) (dressed in hospital scrubs)  Eye Contact: Good  Speech: Clear and Coherent; Normal Rate  Speech Volume: Normal  Handedness: Right   Mood and Affect  Mood: Euthymic  Affect: Blunt   Thought Process  Thought Processes: Coherent; Goal Directed; Linear  Descriptions of Associations:Intact  Orientation:Full (Time, Place and Person)  Thought Content:Logical  History of Schizophrenia/Schizoaffective disorder:No data recorded Duration of Psychotic Symptoms:No data recorded Hallucinations:Hallucinations: None  Ideas of Reference:None  Suicidal Thoughts:Suicidal Thoughts: No  Homicidal Thoughts:Homicidal Thoughts: No   Sensorium  Memory: Immediate Good  Judgment: Good  Insight: Good   Executive Functions  Concentration: Good  Attention Span: Good  Recall: Good  Fund of Knowledge: Good  Language: Good   Psychomotor Activity  Psychomotor Activity: Psychomotor Activity: Normal   Assets  Assets: Communication Skills; Desire for Improvement; Housing; Leisure Time; Resilience; Social Support   Sleep  Sleep: Sleep: Fair   Physical Exam: Physical Exam Constitutional:      General: He is not in acute distress.    Appearance: He is not ill-appearing, toxic-appearing or diaphoretic.  Eyes:     General: No scleral icterus. Cardiovascular:     Rate and Rhythm: Normal rate.  Pulmonary:     Effort:  Pulmonary effort is normal. No respiratory distress.  Neurological:     Mental Status: He is alert and oriented to person, place, and time.  Psychiatric:        Attention and Perception: Attention and perception normal.        Mood and Affect: Mood normal. Affect is blunt.        Speech: Speech normal.        Behavior: Behavior normal. Behavior is cooperative.        Thought Content: Thought content normal.        Cognition and Memory: Cognition and memory normal.        Judgment: Judgment normal.    Review of Systems  Constitutional:  Negative for chills and fever.  Respiratory:  Negative for shortness of breath.   Cardiovascular:  Negative for chest pain and palpitations.  Gastrointestinal:  Negative for abdominal pain.  Neurological:  Negative for headaches.  Psychiatric/Behavioral:  Positive for substance abuse.    Blood pressure (!) 118/59, pulse 64, temperature 98 F (36.7 C), temperature source Oral, resp. rate 16, height 5\' 9"  (1.753 m), weight 86.2 kg, SpO2  98 %. Body mass index is 28.06 kg/m.  Treatment Plan Summary: Plan patient psychiatrically cleared for discharge. To follow up with substance use treatment.  Disposition: No evidence of imminent risk to self or others at present.   Patient does not meet criteria for psychiatric inpatient admission. Supportive therapy provided about ongoing stressors. Discussed crisis plan, support from social network, calling 911, coming to the Emergency Department, and calling Suicide Hotline.  Lauree Chandler, NP 06/17/2023 9:07 AM

## 2023-06-17 NOTE — ED Notes (Signed)
Breakfast tray provided with juice  

## 2023-06-26 ENCOUNTER — Other Ambulatory Visit: Payer: Self-pay | Admitting: Sports Medicine

## 2023-06-26 DIAGNOSIS — M7541 Impingement syndrome of right shoulder: Secondary | ICD-10-CM

## 2023-06-26 DIAGNOSIS — G8929 Other chronic pain: Secondary | ICD-10-CM

## 2023-06-26 DIAGNOSIS — M778 Other enthesopathies, not elsewhere classified: Secondary | ICD-10-CM

## 2023-06-26 DIAGNOSIS — M19011 Primary osteoarthritis, right shoulder: Secondary | ICD-10-CM

## 2023-06-26 DIAGNOSIS — M7551 Bursitis of right shoulder: Secondary | ICD-10-CM

## 2023-07-16 ENCOUNTER — Other Ambulatory Visit: Payer: Medicaid Other

## 2023-07-16 ENCOUNTER — Other Ambulatory Visit: Payer: Self-pay | Admitting: Sports Medicine

## 2023-07-16 DIAGNOSIS — M19011 Primary osteoarthritis, right shoulder: Secondary | ICD-10-CM

## 2023-07-16 DIAGNOSIS — M778 Other enthesopathies, not elsewhere classified: Secondary | ICD-10-CM

## 2023-07-16 DIAGNOSIS — G8929 Other chronic pain: Secondary | ICD-10-CM

## 2023-07-16 DIAGNOSIS — M7551 Bursitis of right shoulder: Secondary | ICD-10-CM

## 2023-07-16 DIAGNOSIS — M7541 Impingement syndrome of right shoulder: Secondary | ICD-10-CM

## 2023-07-26 ENCOUNTER — Ambulatory Visit: Admission: RE | Admit: 2023-07-26 | Payer: MEDICAID | Source: Ambulatory Visit

## 2023-08-06 ENCOUNTER — Ambulatory Visit: Payer: Medicaid Other | Admitting: Infectious Diseases

## 2023-08-10 ENCOUNTER — Ambulatory Visit
Admission: RE | Admit: 2023-08-10 | Discharge: 2023-08-10 | Disposition: A | Discharge status: Home / Self Care | Payer: MEDICAID | Source: Ambulatory Visit | Attending: Sports Medicine | Admitting: Sports Medicine

## 2023-08-10 DIAGNOSIS — M778 Other enthesopathies, not elsewhere classified: Secondary | ICD-10-CM

## 2023-08-10 DIAGNOSIS — M7551 Bursitis of right shoulder: Secondary | ICD-10-CM

## 2023-08-10 DIAGNOSIS — M19011 Primary osteoarthritis, right shoulder: Secondary | ICD-10-CM

## 2023-08-10 DIAGNOSIS — G8929 Other chronic pain: Secondary | ICD-10-CM

## 2023-08-10 DIAGNOSIS — M7541 Impingement syndrome of right shoulder: Secondary | ICD-10-CM

## 2023-10-12 ENCOUNTER — Other Ambulatory Visit: Payer: Self-pay | Admitting: Infectious Diseases

## 2023-10-12 DIAGNOSIS — B20 Human immunodeficiency virus [HIV] disease: Secondary | ICD-10-CM

## 2023-10-22 ENCOUNTER — Ambulatory Visit: Payer: MEDICAID | Admitting: Infectious Diseases

## 2023-10-29 ENCOUNTER — Other Ambulatory Visit: Payer: Self-pay | Admitting: Infectious Diseases

## 2023-10-29 ENCOUNTER — Other Ambulatory Visit: Payer: Self-pay

## 2023-10-29 DIAGNOSIS — B20 Human immunodeficiency virus [HIV] disease: Secondary | ICD-10-CM

## 2023-10-29 MED ORDER — BIKTARVY 50-200-25 MG PO TABS
1.0000 | ORAL_TABLET | ORAL | 0 refills | Status: DC
Start: 2023-10-29 — End: 2023-12-03

## 2023-11-19 ENCOUNTER — Ambulatory Visit: Payer: MEDICAID | Admitting: Infectious Diseases

## 2023-12-03 ENCOUNTER — Other Ambulatory Visit: Payer: Self-pay

## 2023-12-03 DIAGNOSIS — B20 Human immunodeficiency virus [HIV] disease: Secondary | ICD-10-CM

## 2023-12-03 MED ORDER — BIKTARVY 50-200-25 MG PO TABS
1.0000 | ORAL_TABLET | ORAL | 0 refills | Status: DC
Start: 2023-12-03 — End: 2024-01-14

## 2023-12-17 ENCOUNTER — Ambulatory Visit: Payer: MEDICAID | Admitting: Infectious Diseases

## 2023-12-19 ENCOUNTER — Telehealth: Payer: Self-pay

## 2023-12-19 NOTE — Telephone Encounter (Signed)
 Received refill request for Biktarvy from [employer]. Roy [name] is due for follow up, called to schedule, no answer. Left HIPAA compliant voicemail requesting callback.   Biktarvy last dispensed on 12/03/23  Sandie Ano, RN

## 2024-01-14 ENCOUNTER — Other Ambulatory Visit
Admission: RE | Admit: 2024-01-14 | Discharge: 2024-01-14 | Disposition: A | Discharge status: Home / Self Care | Payer: MEDICAID | Source: Ambulatory Visit | Attending: Infectious Diseases | Admitting: Infectious Diseases

## 2024-01-14 ENCOUNTER — Encounter: Payer: Self-pay | Admitting: Infectious Diseases

## 2024-01-14 ENCOUNTER — Ambulatory Visit: Payer: MEDICAID | Attending: Infectious Diseases | Admitting: Infectious Diseases

## 2024-01-14 ENCOUNTER — Other Ambulatory Visit (HOSPITAL_COMMUNITY): Payer: Self-pay

## 2024-01-14 DIAGNOSIS — F319 Bipolar disorder, unspecified: Secondary | ICD-10-CM | POA: Insufficient documentation

## 2024-01-14 DIAGNOSIS — F17210 Nicotine dependence, cigarettes, uncomplicated: Secondary | ICD-10-CM | POA: Diagnosis not present

## 2024-01-14 DIAGNOSIS — M25552 Pain in left hip: Secondary | ICD-10-CM | POA: Insufficient documentation

## 2024-01-14 DIAGNOSIS — Z96641 Presence of right artificial hip joint: Secondary | ICD-10-CM | POA: Diagnosis not present

## 2024-01-14 DIAGNOSIS — Z79899 Other long term (current) drug therapy: Secondary | ICD-10-CM | POA: Insufficient documentation

## 2024-01-14 DIAGNOSIS — Z5982 Transportation insecurity: Secondary | ICD-10-CM | POA: Insufficient documentation

## 2024-01-14 DIAGNOSIS — B20 Human immunodeficiency virus [HIV] disease: Secondary | ICD-10-CM | POA: Insufficient documentation

## 2024-01-14 DIAGNOSIS — I10 Essential (primary) hypertension: Secondary | ICD-10-CM | POA: Diagnosis not present

## 2024-01-14 DIAGNOSIS — F1490 Cocaine use, unspecified, uncomplicated: Secondary | ICD-10-CM | POA: Diagnosis not present

## 2024-01-14 LAB — CBC WITH DIFFERENTIAL/PLATELET
Abs Immature Granulocytes: 0.01 10*3/uL (ref 0.00–0.07)
Basophils Absolute: 0 10*3/uL (ref 0.0–0.1)
Basophils Relative: 1 %
Eosinophils Absolute: 0.1 10*3/uL (ref 0.0–0.5)
Eosinophils Relative: 2 %
HCT: 41.6 % (ref 39.0–52.0)
Hemoglobin: 14.1 g/dL (ref 13.0–17.0)
Immature Granulocytes: 0 %
Lymphocytes Relative: 54 %
Lymphs Abs: 1.5 10*3/uL (ref 0.7–4.0)
MCH: 33.1 pg (ref 26.0–34.0)
MCHC: 33.9 g/dL (ref 30.0–36.0)
MCV: 97.7 fL (ref 80.0–100.0)
Monocytes Absolute: 0.3 10*3/uL (ref 0.1–1.0)
Monocytes Relative: 9 %
Neutro Abs: 1 10*3/uL — ABNORMAL LOW (ref 1.7–7.7)
Neutrophils Relative %: 34 %
Platelets: 215 10*3/uL (ref 150–400)
RBC: 4.26 MIL/uL (ref 4.22–5.81)
RDW: 12 % (ref 11.5–15.5)
WBC: 2.9 10*3/uL — ABNORMAL LOW (ref 4.0–10.5)
nRBC: 0 % (ref 0.0–0.2)

## 2024-01-14 LAB — COMPREHENSIVE METABOLIC PANEL
ALT: 15 U/L (ref 0–44)
AST: 18 U/L (ref 15–41)
Albumin: 3.8 g/dL (ref 3.5–5.0)
Alkaline Phosphatase: 68 U/L (ref 38–126)
Anion gap: 10 (ref 5–15)
BUN: 27 mg/dL — ABNORMAL HIGH (ref 6–20)
CO2: 21 mmol/L — ABNORMAL LOW (ref 22–32)
Calcium: 9 mg/dL (ref 8.9–10.3)
Chloride: 108 mmol/L (ref 98–111)
Creatinine, Ser: 1.15 mg/dL (ref 0.61–1.24)
GFR, Estimated: >60 mL/min (ref >60)
Glucose, Bld: 99 mg/dL (ref 70–99)
Potassium: 4.2 mmol/L (ref 3.5–5.1)
Sodium: 139 mmol/L (ref 135–145)
Total Bilirubin: 0.4 mg/dL (ref 0.0–1.2)
Total Protein: 6.7 g/dL (ref 6.5–8.1)

## 2024-01-14 LAB — HEMOGLOBIN A1C
Hgb A1c MFr Bld: 5.8 % — ABNORMAL HIGH (ref 4.8–5.6)
Mean Plasma Glucose: 119.76 mg/dL

## 2024-01-14 MED ORDER — BIKTARVY 50-200-25 MG PO TABS
1.0000 | ORAL_TABLET | ORAL | 6 refills | Status: AC
Start: 2024-01-14 — End: ?

## 2024-01-14 NOTE — Progress Notes (Unsigned)
NAME: Roy [name]  DOB: 1966/10/03  MRN: [id number]  Date/Time: 01/14/2024 10:06 AM   Subjective:  Follow up visit for HIV Vl 50 fromApril 2024 and cd4 is 580= 100% adherent to biktarvy  C/o left hip pain- was referred to [facility] by his PCP for hip replacement and he wants him to quit smoking an cocaine. Pt says he can't quit smoking but can stop cocaine He wants a referral to another ortho- he will ask his PCP ? Roy [name] is a 58 y.o. 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 The infection healed completely HE is on Biktarvy for HIV  He is 10% adherent Has missed appts with me due to transportation issue Current smoker and cocaine use   HIV diagnosed 18 yrs ago Nadir Cd4 NK HAARt history Currently on Biktarvy Previously complera Acquired thru IVDA/sex with women Male partner who is negative No sex with men  Genotype-UK ? Past Medical History:  Diagnosis Date   AIDS (acquired immune deficiency syndrome) (HCC)    Angioedema    ARF (acute renal failure) (HCC)    Arthritis    Asthma    Bipolar disorder (HCC)    BPH (benign prostatic hyperplasia)    Bradycardia    Bronchitis    Cocaine abuse (HCC)    Coronary artery disease    Depression    Dyslipidemia    Dysrhythmia    1st degree heart block/ brady   Genital warts    GERD (gastroesophageal reflux disease)    Hepatitis C    treated   HIV (human immunodeficiency virus infection) (HCC)    HTN (hypertension)    Myocardial infarction (HCC) 2020   pt states stress test showed mild heart attack   Pre-diabetes    Septic hip (HCC)    SVT (supraventricular tachycardia) (HCC)     Past Surgical History:  Procedure Laterality Date    APPLICATION OF WOUND VAC Right 09/01/2019   Procedure: APPLICATION OF WOUND VAC;  Surgeon: Kennedy Bucker, MD;  Location: [facility] ORS;  Service: Orthopedics;  Laterality: Right;  Serial # [id number]   HERNIA REPAIR Left    inguinal   TOE SURGERY Right    TOTAL HIP ARTHROPLASTY Right 09/01/2019   Procedure: TOTAL HIP ARTHROPLASTY ANTERIOR APPROACH;  Surgeon: Kennedy Bucker, MD;  Location: [facility] ORS;  Service: Orthopedics;  Laterality: Right;    Social History   Socioeconomic History   Marital status: Divorced    Spouse name: Not on file   Number of children: Not on file   Years of education: Not on file   Highest education level: Not on file  Occupational History   Not on file  Tobacco Use   Smoking status: Every Day    Current packs/day: 0.25    Average packs/day: 0.3 packs/day for 40.0 years (10.0 ttl pk-yrs)    Types: Cigarettes   Smokeless tobacco: Never  Vaping Use   Vaping status: Never Used  Substance and Sexual Activity   Alcohol use: Yes    Comment: once weekly   Drug use: Yes    Types: Cocaine, "Crack" cocaine    Comment: per pt last used 05-2022 (stated this on 12-28-22)   Sexual activity: Yes  Other Topics Concern  Not on file  Social History Narrative   Not on file   Social Drivers of Health   Financial Resource Strain: Medium Risk (11/28/2023)   Received from [facility]   Overall Financial Resource Strain (CARDIA)    Difficulty of Paying Living Expenses: Somewhat hard  Food Insecurity: Food Insecurity Present (11/28/2023)   Received from [facility]   Hunger Vital Sign    Worried About Running Out of Food in the Last Year: Sometimes true    Ran Out of Food in the Last Year: Sometimes true  Transportation Needs: Unmet Transportation Needs (11/28/2023)   Received from [facility]    In the past 12 months, has lack of transportation kept you from medical appointments or from getting  medications?: No    Lack of Transportation (Non-Medical): Yes  Physical Activity: Not on file  Stress: Not on file  Social Connections: Not on file  Intimate Partner Violence: Not At Risk (07/13/2021)   Received from [facility]   Humiliation, Afraid, Rape, and Kick questionnaire    Fear of Current or Ex-Partner: No    Emotionally Abused: No    Physically Abused: No    Sexually Abused: No    Family History  Problem Relation Age of Onset   Cancer Brother    Uterine cancer Mother    CAD Mother    Hypertension Mother    Hyperlipidemia Mother    Allergies  Allergen Reactions   Amlodipine Swelling    Of the tongue   Lisinopril Swelling   Statins Other (See Comments)    Body aches   Bee Pollen Itching and Other (See Comments)    Itchy eyes and runny nose   Lactose Other (See Comments)    GI distress   Pollen Extract Other (See Comments)    Itchy eyes and runny nose   Yellow Jacket Venom Itching   ? Current Outpatient Medications  Medication Sig Dispense Refill   albuterol (VENTOLIN HFA) 108 (90 Base) MCG/ACT inhaler Inhale 1-2 puffs into the lungs every 4 (four) hours as needed for wheezing or shortness of breath. 18 g 1   BIKTARVY 50-200-25 MG TABS tablet Take 1 tablet by mouth every morning. 30 tablet 0   cetirizine (ZYRTEC) 10 MG tablet Take 1 tablet by mouth daily.     DULoxetine (CYMBALTA) 30 MG capsule Take 30 mg by mouth daily.     losartan (COZAAR) 100 MG tablet Take 100 mg by mouth every morning.     mirtazapine (REMERON) 7.5 MG tablet Take 1 tablet (7.5 mg total) by mouth at bedtime. 30 tablet 1   risperiDONE (RISPERDAL) 1 MG tablet Take 1 tablet (1 mg total) by mouth at bedtime. 30 tablet 1   rosuvastatin (CRESTOR) 20 MG tablet Take 20 mg by mouth daily.     No current facility-administered medications for this visit.    REVIEW OF SYSTEMS:  Const: negative fever, negative chills, negative weight loss Eyes: negative diplopia or visual changes, negative eye  pain ENT: negative coryza, negative sore throat Resp: negative cough, hemoptysis, dyspnea Cards: negative for chest pain, palpitations, lower extremity edema GU: negative for frequency, dysuria and hematuria Skin: negative for rash and pruritus Heme: negative for easy bruising and gum/nose bleeding MS: left hip pain, rt shoulder pain Neurolo:negative for headaches, dizziness, vertigo, memory problems  Psych:  anxiety, depression   Objective:  VITALS:  BP (!) 158/91   Pulse Marland Kitchen)  59   Temp (!) 97 F (36.1 C) (Temporal)   Ht 5\' 9"  (1.753 m)   Wt 178 lb (80.7 kg)   BMI 26.29 kg/m  PHYSICAL EXAM:  General: Alert, cooperative, no distress, appears stated age.  Head: Normocephalic, without obvious abnormality, atraumatic. Eyes: Conjunctivae clear, anicteric sclerae. Pupils are equal Nose: Nares normal. No drainage or sinus tenderness. Throat: Lips, mucosa, and tongue normal. No Thrush edentulous Neck: Supple, symmetrical, no adenopathy, thyroid: non tender no carotid bruit and no JVD. Back: No CVA tenderness. Lungs: Clear to auscultation bilaterally. No Wheezing or Rhonchi. No rales. Heart: Regular rate and rhythm, no murmur, rub or gallop. Abdomen: Soft, non-tender,not distended. Bowel sounds normal. No masses Extremities: Extremities normal, atraumatic, no cyanosis. No edema. No clubbing Skin: No rashes or lesions. Not Jaundiced Lymph: Cervical, supraclavicular normal. Neurologic: Grossly non-focal  Health maintenance Vaccination Immunization History  Administered Date(s) Administered  COVID-19 VAC,BIVALENT,MODERNA(BLUE CAP) 11/20/2021, 07/03/2022  COVID-19 VAC,MRNA,TRIS(12Y UP)(PFIZER)(GRAY CAP) 12/15/2020, 01/05/2021, 05/04/2021  COVID-19 VACC,(JANSSEN)(PF) 06/04/2020  COVID-19 VACC,MRNA,(PFIZER)(PF) 12/15/2020  HEPATITIS B VACCINE ADULT,IM(ENERGIX B, RECOMBIVAX) 10/12/2008, 02/15/2009, 07/04/2017, 08/05/2017  Hepatitis A 07/04/2017, 08/05/2017  INFLUENZA TIV (TRI) PF  (IM) 10/12/2008, 09/30/2009, 12/05/2010, 09/02/2012  Influenza Recomb PF (Quad) Injectable(Egg Free)18+ 02/21/2017  Influenza Vaccine Quad (IIV4 PF) 33mo+ injectable 08/21/2013, 09/03/2014, 10/31/2015, 10/02/2016, 02/21/2017, 01/14/2019, 09/21/2020  Influenza Virus Vaccine, unspecified formulation 08/21/2013, 09/03/2014, 10/31/2015, 10/02/2016, 02/21/2017, 11/16/2021, 12/06/2021  Influenza, (QUAD) Intradermal PF (18-93yrs) 02/21/2017  Meningococcal C Conjugate 07/04/2017  PNEUMOCOCCAL POLYSACCHARIDE 23 04/20/2008, 08/25/2013, 02/21/2017  PPD Test 10/12/2008, 02/15/2009, 03/08/2009, 01/03/2010, 04/11/2010, 06/19/2011, 09/02/2012  Pneumococcal Conjugate 13-Valent 12/02/2012  SHINGRIX-ZOSTER VACCINE (HZV), RECOMBINANT,SUB-UNIT,ADJUVANTED IM 12/28/2020, 07/12/2021, 08/31/2021  SMALLPOX,MONKEYPOX(PF)(JYNNEOS) 08/31/2021, 10/02/2021  TD(TDVAX),ADSORBED,2LF(IM)(PF) 05/17/2006  TdaP 04/20/2008, 02/09/2019, 07/28/2021  Vaccine Date last given comment  Influenza    Hepatitis B    Hepatitis A    Prevnar-PCV-13    Pneumovac-PPSV-23    TdaP    HPV    Shingrix ( zoster vaccine)     ______________________  Labs Lab Result  Date comment  HIV VL     CD4     Genotype     [id number]     HIV antibody     RPR     Quantiferon Gold     Hep C ab     Hepatitis B-ab,ag,c     Hepatitis A-IgM, IgG /T     Lipid     GC/CHL     PAP     HB,PLT,Cr, LFT       Preventive  Procedure Result  Date comment  colonoscopy     Mammogram     Dental exam     Opthal       Impression/Recommendation ?HIV- On biktarvy Last Vl < 20 and cd4 is 700 Will get labs today ( cbc/cmp/rpr/VL, cd4, ) On Biktarvy- 100% adherent   C/o rt shoulder pain- ? 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

## 2024-01-15 LAB — T-HELPER CELLS CD4/CD8 %
% CD 4 Pos. Lymph.: 34.7 % (ref 30.8–58.5)
Absolute CD 4 Helper: 555 /uL (ref 359–1519)
Basophils Absolute: 0 10*3/uL (ref 0.0–0.2)
Basos: 1 %
CD3+CD4+ Cells/CD3+CD8+ Cells Bld: 0.66 — ABNORMAL LOW (ref 0.92–3.72)
CD3+CD8+ Cells # Bld: 835 /uL (ref 109–897)
CD3+CD8+ Cells NFr Bld: 52.2 % — ABNORMAL HIGH (ref 12.0–35.5)
EOS (ABSOLUTE): 0.1 10*3/uL (ref 0.0–0.4)
Eos: 3 %
Hematocrit: 42.6 % (ref 37.5–51.0)
Hemoglobin: 14.4 g/dL (ref 13.0–17.7)
Immature Grans (Abs): 0 10*3/uL (ref 0.0–0.1)
Immature Granulocytes: 0 %
Lymphocytes Absolute: 1.6 10*3/uL (ref 0.7–3.1)
Lymphs: 53 %
MCH: 34.1 pg — ABNORMAL HIGH (ref 26.6–33.0)
MCHC: 33.8 g/dL (ref 31.5–35.7)
MCV: 101 fL — ABNORMAL HIGH (ref 79–97)
Monocytes Absolute: 0.3 10*3/uL (ref 0.1–0.9)
Monocytes: 9 %
Neutrophils Absolute: 1 10*3/uL — ABNORMAL LOW (ref 1.4–7.0)
Neutrophils: 34 %
Platelets: 220 10*3/uL (ref 150–450)
RBC: 4.22 x10E6/uL (ref 4.14–5.80)
RDW: 11.9 % (ref 11.6–15.4)
WBC: 2.9 10*3/uL — ABNORMAL LOW (ref 3.4–10.8)

## 2024-01-15 LAB — HIV-1 RNA QUANT-NO REFLEX-BLD
HIV 1 RNA Quant: 40 {copies}/mL
LOG10 HIV-1 RNA: 1.602 {Log}

## 2024-01-15 LAB — RPR: RPR Ser Ql: NONREACTIVE

## 2024-01-19 LAB — QUANTIFERON-TB GOLD PLUS (RQFGPL)
QuantiFERON Mitogen Value: >10 [IU]/mL
QuantiFERON Nil Value: 0.06 [IU]/mL
QuantiFERON TB1 Ag Value: 0.04 [IU]/mL
QuantiFERON TB2 Ag Value: 0.05 [IU]/mL

## 2024-01-19 LAB — QUANTIFERON-TB GOLD PLUS: QuantiFERON-TB Gold Plus: NEGATIVE

## 2024-01-21 ENCOUNTER — Emergency Department: Admit: 2024-01-21 | Discharge: 2024-01-21 | Discharge status: Against Medical Advice | Payer: MEDICAID

## 2024-01-21 ENCOUNTER — Other Ambulatory Visit: Payer: Self-pay | Admitting: Family Medicine

## 2024-01-21 DIAGNOSIS — R1033 Periumbilical pain: Secondary | ICD-10-CM

## 2024-01-21 DIAGNOSIS — R1905 Periumbilic swelling, mass or lump: Secondary | ICD-10-CM

## 2024-01-22 ENCOUNTER — Telehealth: Payer: Self-pay

## 2024-01-22 NOTE — Telephone Encounter (Signed)
 I attempted to reach the patient. No answer and LVM on a secured VM letting the patient know labs are normal. Levoy Geisen Adel Holt, CMA

## 2024-01-22 NOTE — Telephone Encounter (Signed)
-----   Message from [facility] sent at 01/21/2024  3:58 PM EST ----- Please let him know that his cd4 and Vl stable and 555 and 40 respectively- WBC is low as usual- nothing to worry now ----- Message ----- From: Rebecka, [facility] [place] Sent: 01/14/2024  11:00 AM EST To: Donald Berlin, MD

## 2024-01-26 ENCOUNTER — Emergency Department: Admit: 2024-01-26 | Discharge: 2024-01-27 | Disposition: A | Discharge status: Home / Self Care | Payer: MEDICAID

## 2024-01-26 DIAGNOSIS — R079 Chest pain, unspecified: Principal | ICD-10-CM

## 2024-01-28 ENCOUNTER — Telehealth: Payer: Self-pay

## 2024-01-28 NOTE — Telephone Encounter (Signed)
Patient called requesting Ensure Rx. Reports he is "skin and bones."  Asked if he had a recent weight measurement. States he is 180 lb at 5'9 for a BMI of 26.6. Will route to provider.   He would like Rx shared with Laurette Schimke with [facility].   Sandie Ano, RN

## 2024-01-29 DIAGNOSIS — M25552 Pain in left hip: Principal | ICD-10-CM

## 2024-02-07 NOTE — Telephone Encounter (Signed)
 Patient called back again requesting Rx for ensure.  Per Dr. Rivka Safer she will not be writing an Rx for ensure at this time. Dr. Rivka Safer has spoke to patient's case manager and she will be assisting patient with getting food.  Patient advised of no Rx for ensure will be given at this time and his case manager will be reaching out to him soon to help with food assistance.  Jabier Deese Jonathon Resides, CMA

## 2024-02-19 ENCOUNTER — Ambulatory Visit: Admission: RE | Admit: 2024-02-19 | Payer: MEDICAID | Source: Ambulatory Visit

## 2024-03-23 ENCOUNTER — Ambulatory Visit
Admit: 2024-03-23 | Attending: Student in an Organized Health Care Education/Training Program | Primary: Student in an Organized Health Care Education/Training Program

## 2024-03-23 ENCOUNTER — Telehealth: Payer: Self-pay

## 2024-03-23 DIAGNOSIS — B20 Human immunodeficiency virus [HIV] disease: Secondary | ICD-10-CM

## 2024-03-23 NOTE — Telephone Encounter (Signed)
 Patient called, he is moving back to [facility] and would like a referral to [facility] ID clinic.   Referral placed.   Linna Hoff, BSN, RN

## 2024-04-08 ENCOUNTER — Ambulatory Visit
Admit: 2024-04-08 | Discharge: 2024-04-09 | Payer: MEDICAID | Attending: Student in an Organized Health Care Education/Training Program | Primary: Student in an Organized Health Care Education/Training Program

## 2024-04-08 DIAGNOSIS — B20 Human immunodeficiency virus [HIV] disease: Principal | ICD-10-CM

## 2024-04-08 DIAGNOSIS — K439 Ventral hernia without obstruction or gangrene: Principal | ICD-10-CM

## 2024-04-08 DIAGNOSIS — Z79899 Other long term (current) drug therapy: Principal | ICD-10-CM

## 2024-04-08 DIAGNOSIS — Z5181 Encounter for therapeutic drug level monitoring: Principal | ICD-10-CM

## 2024-04-08 DIAGNOSIS — Z113 Encounter for screening for infections with a predominantly sexual mode of transmission: Principal | ICD-10-CM

## 2024-04-08 DIAGNOSIS — F1410 Cocaine abuse, uncomplicated: Principal | ICD-10-CM

## 2024-04-08 DIAGNOSIS — Z9189 Other specified personal risk factors, not elsewhere classified: Principal | ICD-10-CM

## 2024-04-08 MED ORDER — BIKTARVY 50 MG-200 MG-25 MG TABLET
ORAL_TABLET | Freq: Every morning | ORAL | 11 refills | 30.00 days | Status: CP
Start: 2024-04-08 — End: 2025-04-08

## 2024-04-11 ENCOUNTER — Other Ambulatory Visit: Payer: Self-pay

## 2024-04-11 ENCOUNTER — Emergency Department: Payer: MEDICAID

## 2024-04-11 ENCOUNTER — Emergency Department
Admission: EM | Admit: 2024-04-11 | Discharge: 2024-04-11 | Disposition: A | Discharge status: Home / Self Care | Payer: MEDICAID | Attending: Emergency Medicine | Admitting: Emergency Medicine

## 2024-04-11 DIAGNOSIS — R1033 Periumbilical pain: Secondary | ICD-10-CM | POA: Diagnosis present

## 2024-04-11 DIAGNOSIS — I10 Essential (primary) hypertension: Secondary | ICD-10-CM | POA: Diagnosis not present

## 2024-04-11 HISTORY — DX: Ventral hernia without obstruction or gangrene: K43.9

## 2024-04-11 LAB — COMPREHENSIVE METABOLIC PANEL WITH GFR
ALT: 17 U/L (ref 0–44)
AST: 21 U/L (ref 15–41)
Albumin: 3.5 g/dL (ref 3.5–5.0)
Alkaline Phosphatase: 67 U/L (ref 38–126)
Anion gap: 6 (ref 5–15)
BUN: 27 mg/dL — ABNORMAL HIGH (ref 6–20)
CO2: 23 mmol/L (ref 22–32)
Calcium: 8.7 mg/dL — ABNORMAL LOW (ref 8.9–10.3)
Chloride: 107 mmol/L (ref 98–111)
Creatinine, Ser: 1.47 mg/dL — ABNORMAL HIGH (ref 0.61–1.24)
GFR, Estimated: 55 mL/min — ABNORMAL LOW (ref >60)
Glucose, Bld: 94 mg/dL (ref 70–99)
Potassium: 4.2 mmol/L (ref 3.5–5.1)
Sodium: 136 mmol/L (ref 135–145)
Total Bilirubin: 0.2 mg/dL (ref 0.0–1.2)
Total Protein: 6.5 g/dL (ref 6.5–8.1)

## 2024-04-11 LAB — CBC
HCT: 40.2 % (ref 39.0–52.0)
Hemoglobin: 13.7 g/dL (ref 13.0–17.0)
MCH: 34.1 pg — ABNORMAL HIGH (ref 26.0–34.0)
MCHC: 34.1 g/dL (ref 30.0–36.0)
MCV: 100 fL (ref 80.0–100.0)
Platelets: 216 10*3/uL (ref 150–400)
RBC: 4.02 MIL/uL — ABNORMAL LOW (ref 4.22–5.81)
RDW: 12.4 % (ref 11.5–15.5)
WBC: 3.4 10*3/uL — ABNORMAL LOW (ref 4.0–10.5)
nRBC: 0 % (ref 0.0–0.2)

## 2024-04-11 LAB — LIPASE, BLOOD: Lipase: 36 U/L (ref 11–51)

## 2024-04-11 MED ORDER — IOHEXOL 300 MG/ML  SOLN
75.0000 mL | Freq: Once | INTRAMUSCULAR | Status: AC | PRN
  Administered 2024-04-11: 75 mL via INTRAVENOUS

## 2024-04-11 NOTE — ED Triage Notes (Signed)
 Pt to ED from home [facility] for pain to abdomen, hx hernia. Pain began after lifting furniture today. Pain is to bilateral lower abdomen. Pt also nauseous.  EMS VS: 153/97, 99% RA, CBG 96, HR 57  Pt in no acute distress.

## 2024-04-11 NOTE — ED Provider Notes (Signed)
 [facility] Provider Note    Event Date/Time   First MD Initiated Contact with Patient 04/11/24 1348     (approximate)   History   Chief Complaint Abdominal Pain   HPI  Roy [name] is a 58 y.o. male with past medical history of hypertension, hyperlipidemia, HIV/AIDS, alcohol abuse, and polysubstance abuse who presents to the ED complaining of abdominal pain.  Patient reports that just prior to arrival he was helping a friend to move some furniture when he had significant pain along with a bulge just above his umbilicus.  He states that he has had similar bulging in the past, but it has never been as bad as it was today.  He states that the bulge seems to have slightly improved and pain has also improved, but he still reports some discomfort above the umbilicus.  He denies any nausea, vomiting, diarrhea, constipation, or dysuria.     Physical Exam   Triage Vital Signs: ED Triage Vitals  Encounter Vitals Group     BP 04/11/24 1302 138/88     Systolic BP Percentile --      Diastolic BP Percentile --      Pulse Rate 04/11/24 1302 64     Resp 04/11/24 1302 20     Temp 04/11/24 1302 98.5 F (36.9 C)     Temp Source 04/11/24 1302 Oral     SpO2 04/11/24 1302 98 %     Weight 04/11/24 1301 150 lb (68 kg)     Height 04/11/24 1301 5\' 9"  (1.753 m)     Head Circumference --      Peak Flow --      Pain Score 04/11/24 1302 10     Pain Loc --      Pain Education --      Exclude from Growth Chart --     Most recent vital signs: Vitals:   04/11/24 1302  BP: 138/88  Pulse: 64  Resp: 20  Temp: 98.5 F (36.9 C)  SpO2: 98%    Constitutional: Alert and oriented. Eyes: Conjunctivae are normal. Head: Atraumatic. Nose: No congestion/rhinnorhea. Mouth/Throat: Mucous membranes are moist.  Cardiovascular: Normal rate, regular rhythm. Grossly normal heart sounds.  2+ radial pulses bilaterally. Respiratory: Normal respiratory effort.  No retractions.  Lungs CTAB. Gastrointestinal: Soft and tender to palpation just above the umbilicus where he seems to have a small umbilical hernia that is reducible. No distention. Musculoskeletal: No lower extremity tenderness nor edema.  Neurologic:  Normal speech and language. No gross focal neurologic deficits are appreciated.    ED Results / Procedures / Treatments   Labs (all labs ordered are listed, but only abnormal results are displayed) Labs Reviewed  COMPREHENSIVE METABOLIC PANEL WITH GFR - Abnormal; Notable for the following components:      Result Value   BUN 27 (*)    Creatinine, Ser 1.47 (*)    Calcium  8.7 (*)    GFR, Estimated 55 (*)    All other components within normal limits  CBC - Abnormal; Notable for the following components:   WBC 3.4 (*)    RBC 4.02 (*)    MCH 34.1 (*)    All other components within normal limits  LIPASE, BLOOD    RADIOLOGY CT abdomen/pelvis reviewed and interpreted by me with no inflammatory changes, focal fluid collections, or dilated bowel loops.  PROCEDURES:  Critical Care performed: No  Procedures   MEDICATIONS ORDERED IN ED: Medications  iohexol  (OMNIPAQUE )  300 MG/ML solution 75 mL (75 mLs Intravenous Contrast Given 04/11/24 1431)     IMPRESSION / MDM / ASSESSMENT AND PLAN / ED COURSE  I reviewed the triage vital signs and the nursing notes.                              58 y.o. male with past medical history of hypertension, hyperlipidemia, HIV/AIDS, alcohol abuse, and polysubstance abuse who presents to the ED complaining of pain and bulging above his umbilicus earlier today while moving furniture.  [name] presentation is most consistent with acute presentation with potential threat to life or bodily function.  Differential diagnosis includes, but is not limited to, incarcerated hernia, strangulated hernia, bowel obstruction, uncomplicated hernia.  Patient nontoxic-appearing and in no acute distress, vital signs are  unremarkable.  His abdomen is soft but he does have tenderness above his umbilicus with what feels like a hernia.  We will further assess with CT imaging to ensure no associated complication.  Labs are reassuring with no significant anemia, leukocytosis, electrolyte abnormality, or AKI.  LFTs and lipase are also unremarkable.  CT imaging remarkable only for tiny fat containing abdominal wall hernia.  Given reassuring workup, patient appropriate for discharge home with outpatient follow-up, referral provided to general surgery as needed.  He was counseled to return to the ED for new or worsening symptoms.  Patient agrees with plan.      FINAL CLINICAL IMPRESSION(S) / ED DIAGNOSES   Final diagnoses:  Periumbilical abdominal pain     Rx / DC Orders   ED Discharge Orders     None        Note:  This document was prepared using Dragon voice recognition software and may include unintentional dictation errors.   Twilla Galea, MD 04/11/24 [phone]

## 2024-04-12 IMAGING — CR DG CHEST 2V
1 series · 2 of 2 positions shown · non-contrast
Comparison: None Available.

CLINICAL DATA: Chest pain, shortness of breath

EXAM:
CHEST - 2 VIEW

[Series 1: dg chest 2 view · 0.14mm/px · 2 of 2 slices shown]
[im 1/2]
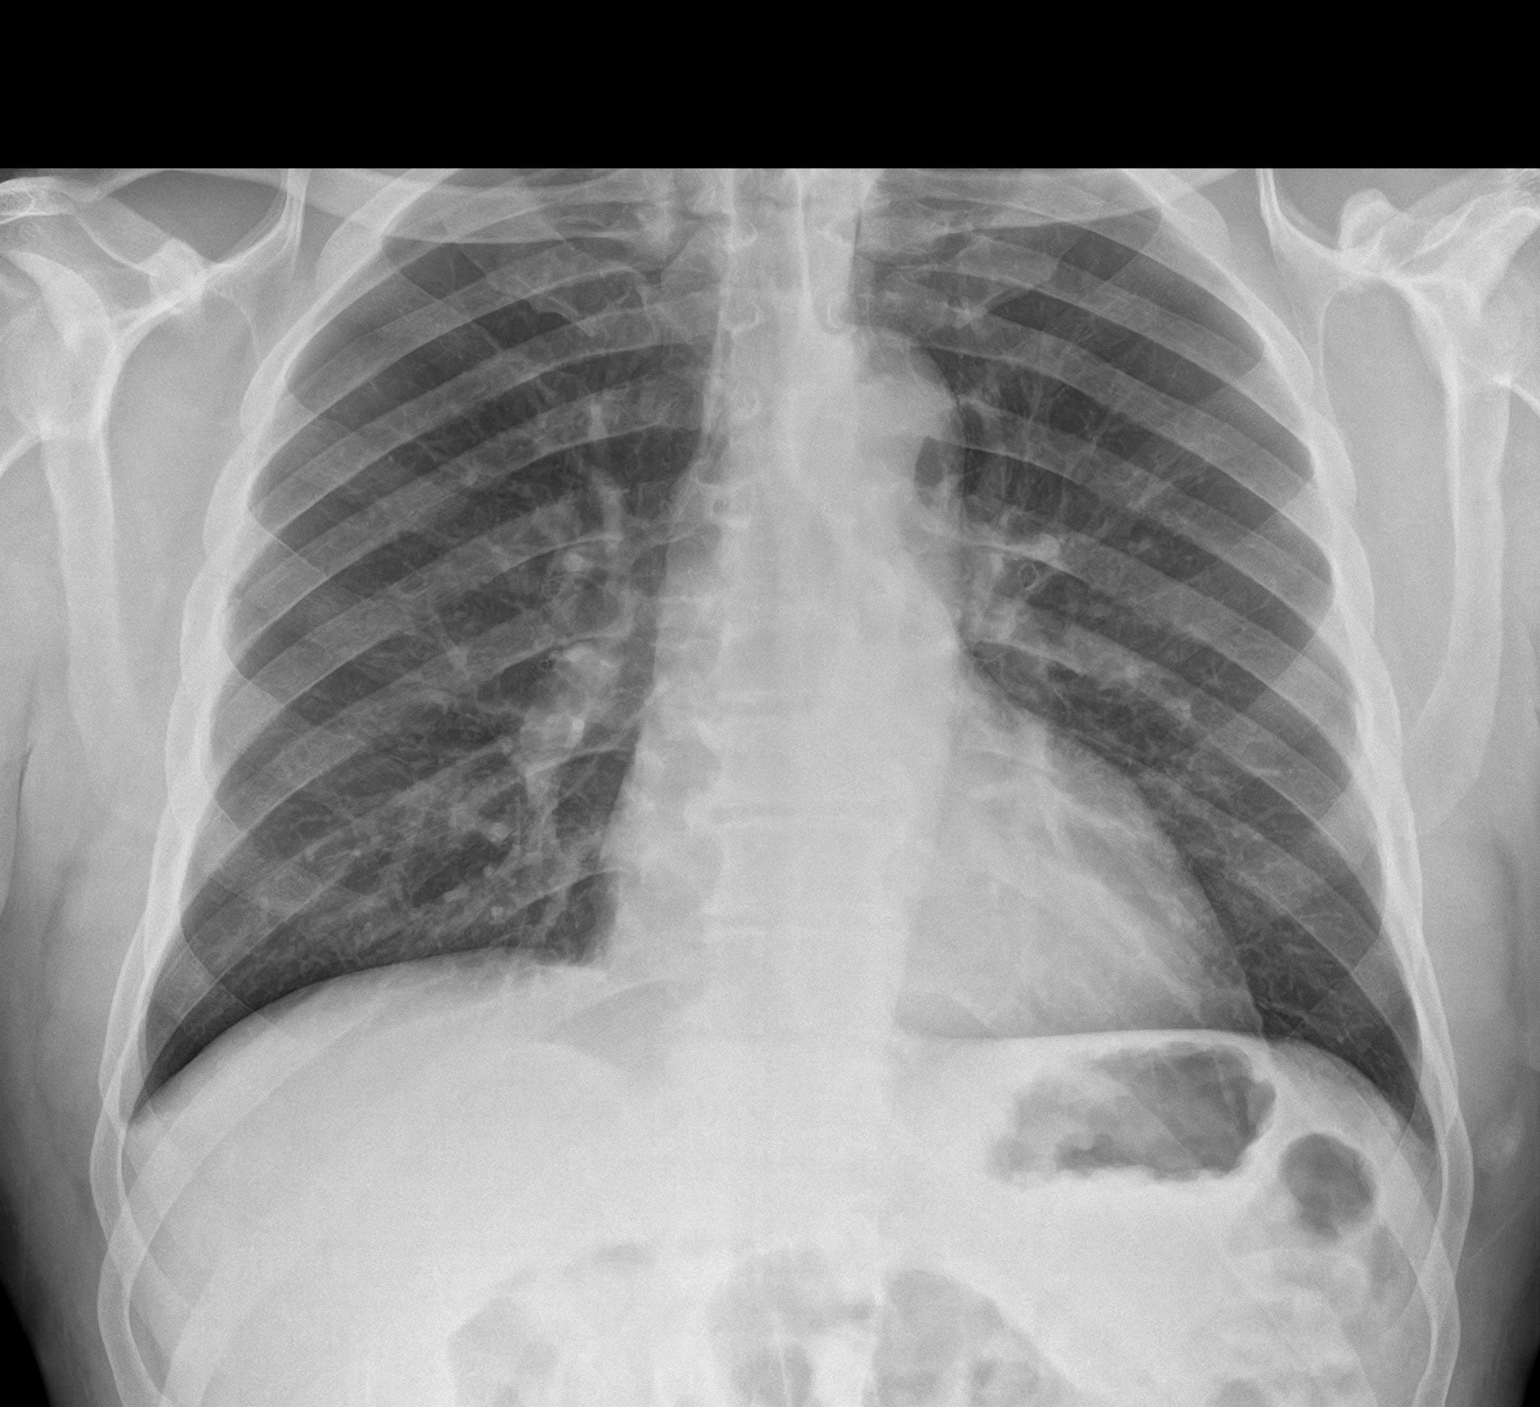
[im 2/2]
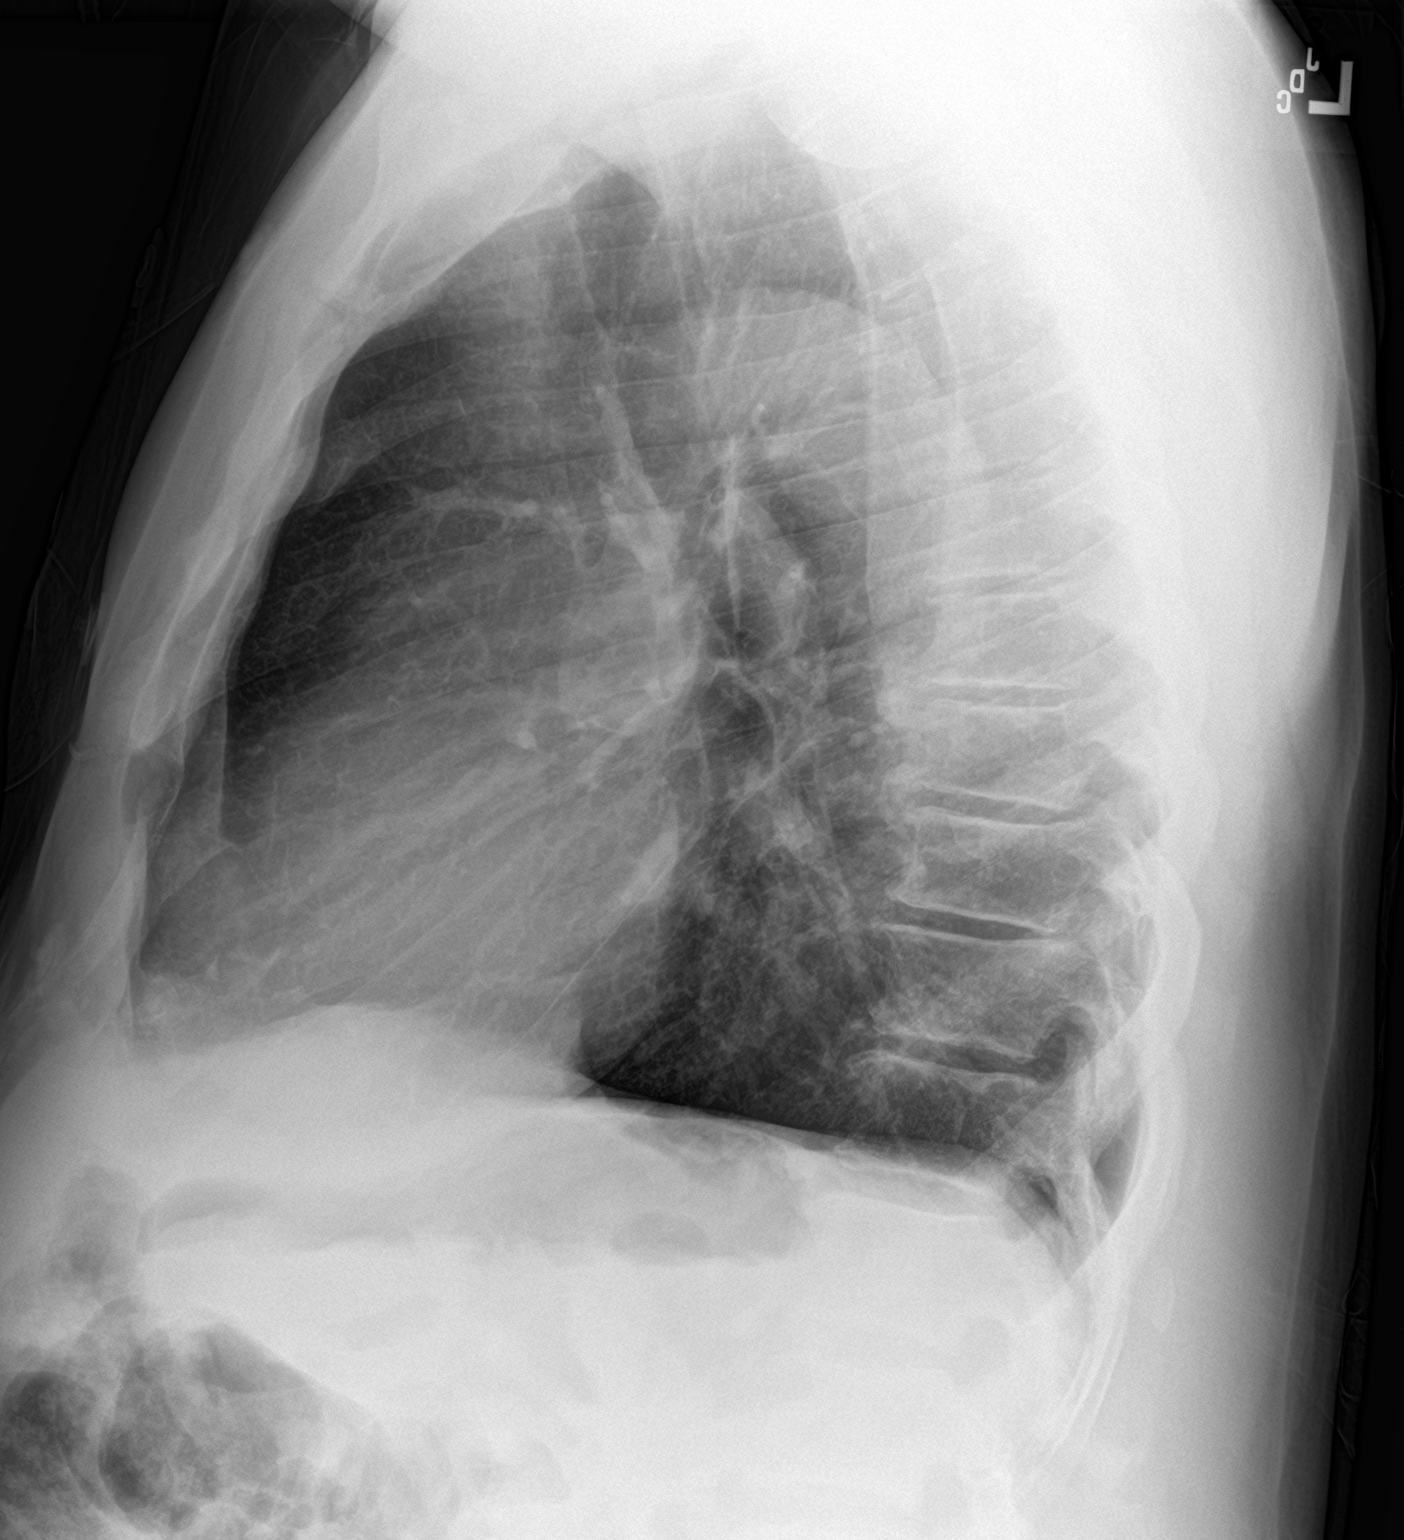

[2 of 2 positions shown; findings below may reference images not displayed]

FINDINGS: The heart size and mediastinal contours are within normal limits.
Both lungs are clear. The visualized skeletal structures are
unremarkable.
IMPRESSION: No active cardiopulmonary disease.

## 2024-05-03 ENCOUNTER — Emergency Department: Payer: MEDICAID

## 2024-05-03 ENCOUNTER — Other Ambulatory Visit: Payer: Self-pay

## 2024-05-03 ENCOUNTER — Observation Stay
Admission: EM | Admit: 2024-05-03 | Discharge: 2024-05-04 | Disposition: A | Discharge status: Home / Self Care | Payer: MEDICAID | Attending: Internal Medicine | Admitting: Internal Medicine

## 2024-05-03 DIAGNOSIS — K529 Noninfective gastroenteritis and colitis, unspecified: Secondary | ICD-10-CM | POA: Diagnosis not present

## 2024-05-03 DIAGNOSIS — Z21 Asymptomatic human immunodeficiency virus [HIV] infection status: Secondary | ICD-10-CM | POA: Insufficient documentation

## 2024-05-03 DIAGNOSIS — R079 Chest pain, unspecified: Secondary | ICD-10-CM

## 2024-05-03 DIAGNOSIS — J45909 Unspecified asthma, uncomplicated: Secondary | ICD-10-CM | POA: Diagnosis not present

## 2024-05-03 DIAGNOSIS — I10 Essential (primary) hypertension: Secondary | ICD-10-CM | POA: Insufficient documentation

## 2024-05-03 DIAGNOSIS — I2510 Atherosclerotic heart disease of native coronary artery without angina pectoris: Secondary | ICD-10-CM | POA: Insufficient documentation

## 2024-05-03 DIAGNOSIS — R55 Syncope and collapse: Secondary | ICD-10-CM | POA: Diagnosis not present

## 2024-05-03 DIAGNOSIS — F32A Depression, unspecified: Secondary | ICD-10-CM | POA: Diagnosis not present

## 2024-05-03 DIAGNOSIS — R1084 Generalized abdominal pain: Secondary | ICD-10-CM | POA: Diagnosis present

## 2024-05-03 DIAGNOSIS — Z87898 Personal history of other specified conditions: Secondary | ICD-10-CM | POA: Insufficient documentation

## 2024-05-03 DIAGNOSIS — Z79899 Other long term (current) drug therapy: Secondary | ICD-10-CM | POA: Insufficient documentation

## 2024-05-03 DIAGNOSIS — N179 Acute kidney failure, unspecified: Secondary | ICD-10-CM | POA: Diagnosis not present

## 2024-05-03 DIAGNOSIS — R2689 Other abnormalities of gait and mobility: Secondary | ICD-10-CM | POA: Insufficient documentation

## 2024-05-03 DIAGNOSIS — F419 Anxiety disorder, unspecified: Secondary | ICD-10-CM | POA: Insufficient documentation

## 2024-05-03 DIAGNOSIS — R262 Difficulty in walking, not elsewhere classified: Secondary | ICD-10-CM | POA: Insufficient documentation

## 2024-05-03 LAB — RESP PANEL BY RT-PCR (RSV, FLU A&B, COVID)  RVPGX2
Influenza A by PCR: NEGATIVE
Influenza B by PCR: NEGATIVE
Resp Syncytial Virus by PCR: NEGATIVE
SARS Coronavirus 2 by RT PCR: NEGATIVE

## 2024-05-03 LAB — URINALYSIS, COMPLETE (UACMP) WITH MICROSCOPIC
Bilirubin Urine: NEGATIVE
Glucose, UA: NEGATIVE mg/dL
Hgb urine dipstick: NEGATIVE
Ketones, ur: NEGATIVE mg/dL
Nitrite: NEGATIVE
Protein, ur: NEGATIVE mg/dL
RBC / HPF: >50 RBC/hpf (ref 0–5)
Specific Gravity, Urine: 1.044 — ABNORMAL HIGH (ref 1.005–1.030)
pH: 5 (ref 5.0–8.0)

## 2024-05-03 LAB — CBC WITH DIFFERENTIAL/PLATELET
Abs Immature Granulocytes: 0.02 10*3/uL (ref 0.00–0.07)
Basophils Absolute: 0 10*3/uL (ref 0.0–0.1)
Basophils Relative: 1 %
Eosinophils Absolute: 0.1 10*3/uL (ref 0.0–0.5)
Eosinophils Relative: 1 %
HCT: 40.6 % (ref 39.0–52.0)
Hemoglobin: 13.7 g/dL (ref 13.0–17.0)
Immature Granulocytes: 0 %
Lymphocytes Relative: 33 %
Lymphs Abs: 2 10*3/uL (ref 0.7–4.0)
MCH: 33.3 pg (ref 26.0–34.0)
MCHC: 33.7 g/dL (ref 30.0–36.0)
MCV: 98.5 fL (ref 80.0–100.0)
Monocytes Absolute: 0.5 10*3/uL (ref 0.1–1.0)
Monocytes Relative: 8 %
Neutro Abs: 3.5 10*3/uL (ref 1.7–7.7)
Neutrophils Relative %: 57 %
Platelets: 170 10*3/uL (ref 150–400)
RBC: 4.12 MIL/uL — ABNORMAL LOW (ref 4.22–5.81)
RDW: 12.6 % (ref 11.5–15.5)
WBC: 6.1 10*3/uL (ref 4.0–10.5)
nRBC: 0 % (ref 0.0–0.2)

## 2024-05-03 LAB — COMPREHENSIVE METABOLIC PANEL WITH GFR
ALT: 19 U/L (ref 0–44)
AST: 25 U/L (ref 15–41)
Albumin: 3.7 g/dL (ref 3.5–5.0)
Alkaline Phosphatase: 66 U/L (ref 38–126)
Anion gap: 10 (ref 5–15)
BUN: 33 mg/dL — ABNORMAL HIGH (ref 6–20)
CO2: 23 mmol/L (ref 22–32)
Calcium: 8.6 mg/dL — ABNORMAL LOW (ref 8.9–10.3)
Chloride: 105 mmol/L (ref 98–111)
Creatinine, Ser: 1.98 mg/dL — ABNORMAL HIGH (ref 0.61–1.24)
GFR, Estimated: 38 mL/min — ABNORMAL LOW (ref >60)
Glucose, Bld: 99 mg/dL (ref 70–99)
Potassium: 4.4 mmol/L (ref 3.5–5.1)
Sodium: 138 mmol/L (ref 135–145)
Total Bilirubin: 0.8 mg/dL (ref 0.0–1.2)
Total Protein: 7.1 g/dL (ref 6.5–8.1)

## 2024-05-03 LAB — ETHANOL: Alcohol, Ethyl (B): <15 mg/dL (ref <15)

## 2024-05-03 LAB — TROPONIN I (HIGH SENSITIVITY)
Troponin I (High Sensitivity): 25 ng/L — ABNORMAL HIGH (ref <18)
Troponin I (High Sensitivity): 22 ng/L — ABNORMAL HIGH (ref <18)

## 2024-05-03 LAB — LIPASE, BLOOD: Lipase: 23 U/L (ref 11–51)

## 2024-05-03 MED ORDER — LOPERAMIDE HCL 2 MG PO CAPS: 2.0000 mg | ORAL_CAPSULE | ORAL | Status: DC | PRN

## 2024-05-03 MED ORDER — MIRTAZAPINE 15 MG PO TABS
7.5000 mg | ORAL_TABLET | Freq: Every day | ORAL | Status: DC
Start: 2024-05-03 — End: 2024-05-03

## 2024-05-03 MED ORDER — SODIUM CHLORIDE 0.9 % IV SOLN: INTRAVENOUS | Status: AC

## 2024-05-03 MED ORDER — ONDANSETRON HCL 4 MG PO TABS: 4.0000 mg | ORAL_TABLET | Freq: Four times a day (QID) | ORAL | Status: DC | PRN

## 2024-05-03 MED ORDER — IOHEXOL 300 MG/ML  SOLN
60.0000 mL | Freq: Once | INTRAMUSCULAR | Status: AC | PRN
  Administered 2024-05-03: 100 mL via INTRAVENOUS

## 2024-05-03 MED ORDER — ONDANSETRON 4 MG PO TBDP: 4.0000 mg | ORAL_TABLET | Freq: Three times a day (TID) | ORAL | 0 refills | Status: DC | PRN

## 2024-05-03 MED ORDER — RISPERIDONE 1 MG PO TABS: 1.0000 mg | ORAL_TABLET | Freq: Every day | ORAL | Status: DC

## 2024-05-03 MED ORDER — MORPHINE SULFATE (PF) 2 MG/ML IV SOLN
2.0000 mg | Freq: Once | INTRAVENOUS | Status: AC
  Administered 2024-05-03: 2 mg via INTRAVENOUS
  Filled 2024-05-03: qty 1

## 2024-05-03 MED ORDER — LOPERAMIDE HCL 2 MG PO CAPS: 2.0000 mg | ORAL_CAPSULE | ORAL | 0 refills | Status: DC | PRN

## 2024-05-03 MED ORDER — ONDANSETRON HCL 4 MG/2ML IJ SOLN
4.0000 mg | Freq: Once | INTRAMUSCULAR | Status: AC
  Administered 2024-05-03: 4 mg via INTRAVENOUS
  Filled 2024-05-03: qty 2

## 2024-05-03 MED ORDER — ACETAMINOPHEN 325 MG PO TABS: 650.0000 mg | ORAL_TABLET | Freq: Four times a day (QID) | ORAL | Status: DC | PRN

## 2024-05-03 MED ORDER — ACETAMINOPHEN 650 MG RE SUPP: 650.0000 mg | Freq: Four times a day (QID) | RECTAL | Status: DC | PRN

## 2024-05-03 MED ORDER — BICTEGRAVIR-EMTRICITAB-TENOFOV 50-200-25 MG PO TABS
1.0000 | ORAL_TABLET | ORAL | Status: DC
  Administered 2024-05-04: 1 via ORAL
  Filled 2024-05-03: qty 1

## 2024-05-03 MED ORDER — LACTATED RINGERS IV BOLUS
1000.0000 mL | Freq: Once | INTRAVENOUS | Status: AC
  Administered 2024-05-03: 1000 mL via INTRAVENOUS

## 2024-05-03 MED ORDER — ROSUVASTATIN CALCIUM 20 MG PO TABS
20.0000 mg | ORAL_TABLET | Freq: Every day | ORAL | Status: DC
  Administered 2024-05-04: 20 mg via ORAL
  Filled 2024-05-03 (×2): qty 1

## 2024-05-03 MED ORDER — LORATADINE 10 MG PO TABS
10.0000 mg | ORAL_TABLET | Freq: Every day | ORAL | Status: DC
  Administered 2024-05-03 – 2024-05-04 (×2): 10 mg via ORAL
  Filled 2024-05-03 (×2): qty 1

## 2024-05-03 MED ORDER — ONDANSETRON HCL 4 MG/2ML IJ SOLN: 4.0000 mg | Freq: Four times a day (QID) | INTRAMUSCULAR | Status: DC | PRN

## 2024-05-03 MED ORDER — ALBUTEROL SULFATE (2.5 MG/3ML) 0.083% IN NEBU: 3.0000 mL | INHALATION_SOLUTION | RESPIRATORY_TRACT | Status: DC | PRN

## 2024-05-03 NOTE — ED Provider Notes (Addendum)
 [facility] Provider Note    Event Date/Time   First MD Initiated Contact with Patient 05/03/24 [phone]     (approximate)   History   Chief Complaint Abdominal Pain   HPI  Roy [name] is a 58 y.o. male with past medical history of hypertension, hyperlipidemia, polysubstance abuse, and HIV/AIDS who presents to the ED complaining of abdominal pain.  Patient reports that he has been feeling ill with generalized abdominal pain since last night.  Patient reports that he had multiple watery bowel movements over the course of the night, has been feeling nauseous with vomiting this morning as well.  He describes pain just above his umbilicus, where he reports he has a hernia that has seemed "stuck out" at times.  He states the pain moves up into his chest, but he denies any fevers, cough, or difficulty breathing.  He is not aware of any sick contacts.     Physical Exam   Triage Vital Signs: ED Triage Vitals [05/03/24 0959]  Encounter Vitals Group     BP 139/75     Systolic BP Percentile      Diastolic BP Percentile      Pulse Rate 60     Resp      Temp      Temp src      SpO2 94 %     Weight 165 lb (74.8 kg)     Height 5\' 9"  (1.753 m)     Head Circumference      Peak Flow      Pain Score 7     Pain Loc      Pain Education      Exclude from Growth Chart     Most recent vital signs: Vitals:   05/03/24 1145 05/03/24 1315  BP:    Pulse: (!) 55 (!) 59  Resp: (!) 24 11  Temp:    SpO2: 98% 99%    Constitutional: Alert and oriented. Eyes: Conjunctivae are normal. Head: Atraumatic. Nose: No congestion/rhinnorhea. Mouth/Throat: Mucous membranes are moist.  Cardiovascular: Normal rate, regular rhythm. Grossly normal heart sounds.  2+ radial pulses bilaterally. Respiratory: Normal respiratory effort.  No retractions. Lungs CTAB. Gastrointestinal: Soft and diffusely tender to palpation with no rebound or guarding.  No obvious hernia on exam. No  distention. Musculoskeletal: No lower extremity tenderness nor edema.  Neurologic:  Normal speech and language. No gross focal neurologic deficits are appreciated.    ED Results / Procedures / Treatments   Labs (all labs ordered are listed, but only abnormal results are displayed) Labs Reviewed  CBC WITH DIFFERENTIAL/PLATELET - Abnormal; Notable for the following components:      Result Value   RBC 4.12 (*)    All other components within normal limits  COMPREHENSIVE METABOLIC PANEL WITH GFR - Abnormal; Notable for the following components:   BUN 33 (*)    Creatinine, Ser 1.98 (*)    Calcium  8.6 (*)    GFR, Estimated 38 (*)    All other components within normal limits  TROPONIN I (HIGH SENSITIVITY) - Abnormal; Notable for the following components:   Troponin I (High Sensitivity) 25 (*)    All other components within normal limits  TROPONIN I (HIGH SENSITIVITY) - Abnormal; Notable for the following components:   Troponin I (High Sensitivity) 22 (*)    All other components within normal limits  RESP PANEL BY RT-PCR (RSV, FLU A&B, COVID)  [id number]  LIPASE, BLOOD  ETHANOL  EKG  ED ECG REPORT I, Twilla Galea, the attending physician, personally viewed and interpreted this ECG.   Date: 05/03/2024  EKG Time: 10:02  Rate: 47  Rhythm: sinus bradycardia  Axis: Normal  Intervals:none  ST&T Change: Inferolateral T wave inversions, similar to previous and consistent with LVH  RADIOLOGY CT abdomen/pelvis reviewed and interpreted by me with no inflammatory changes, focal fluid collections, or dilated bowel loops.  PROCEDURES:  Critical Care performed: No  Procedures   MEDICATIONS ORDERED IN ED: Medications  lactated ringers  bolus 1,000 mL (1,000 mLs Intravenous New Bag/Given 05/03/24 1008)  ondansetron  (ZOFRAN ) injection 4 mg (4 mg Intravenous Given 05/03/24 1009)  morphine (PF) 2 MG/ML injection 2 mg (2 mg Intravenous Given 05/03/24 1009)  iohexol  (OMNIPAQUE ) 300 MG/ML  solution 60 mL (100 mLs Intravenous Contrast Given 05/03/24 1145)     IMPRESSION / MDM / ASSESSMENT AND PLAN / ED COURSE  I reviewed the triage vital signs and the nursing notes.                              58 y.o. male with past medical history of hypertension, hyperlipidemia, polysubstance abuse, and HIV/AIDS who presents to the ED complaining of diffuse abdominal pain moving up into his chest with vomiting and diarrhea since last night.  [name] presentation is most consistent with acute presentation with potential threat to life or bodily function.  Differential diagnosis includes, but is not limited to, incarcerated hernia, strangulated hernia, bowel obstruction, gastroenteritis, pancreatitis, hepatitis, cholecystitis, biliary colic, GERD, ACS, pneumonia, dehydration, electrolyte abnormality, AKI.  Patient nontoxic-appearing and in no acute distress, vital signs remarkable for borderline bradycardia as well as borderline low BP.  EKG shows inferolateral T wave inversions, similar to previous and likely due to LVH.  Symptoms seem atypical for ACS, troponin pending at this time.  No obvious hernia on exam, will further assess with CT imaging of his abdomen/pelvis as well as chest x-ray.  Plan to hydrate with IV fluids, treat symptomatically with IV morphine and Zofran .  Labs with mild AKI but no significant electrolyte abnormality or LFT abnormality.  No significant anemia or leukocytosis noted, patient with mildly elevated troponin but stable on recheck and I suspect this is due to his AKI.  Patient symptoms are improved on reassessment, suspect gastroenteritis given his vomiting and diarrhea, however he is tolerating oral intake without difficulty.  He is appropriate for discharge home and was counseled to follow-up with his PCP for recheck of kidney function, otherwise return to the ED for new worsening symptoms.  Patient agrees with plan.  ----------------------------------------- 2:11  PM on 05/03/2024 ----------------------------------------- Shortly after discharge, patient checked back into the ED stating that he continued to feel unwell.  He admits to crack cocaine use last night.  With his AKI and intractable nausea/vomiting, case discussed with hospitalist for admission.      FINAL CLINICAL IMPRESSION(S) / ED DIAGNOSES   Final diagnoses:  Generalized abdominal pain  Chest pain, unspecified type  Gastroenteritis     Rx / DC Orders   ED Discharge Orders          Ordered    ondansetron  (ZOFRAN -ODT) 4 MG disintegrating tablet  Every 8 hours PRN        05/03/24 1332    loperamide (IMODIUM) 2 MG capsule  As needed        05/03/24 1332  Note:  This document was prepared using Dragon voice recognition software and may include unintentional dictation errors.   Twilla Galea, MD 05/03/24 1333    Twilla Galea, MD 05/03/24 1414

## 2024-05-03 NOTE — ED Triage Notes (Signed)
 Pt arrives via ems from home with c/o ABD pain that began last night. Hx of hernia. Pt presents with CP that began this morning and states he can feel a pulse in his ABD. Denies a tearing sensation. MD reviewed ECG and denies STEMI. Pt A&Ox4.

## 2024-05-03 NOTE — ED Notes (Signed)
 Urinal placed at bedside

## 2024-05-03 NOTE — Discharge Instructions (Addendum)
 Your kidney function today was slightly worse than it has been in the past.  This is likely due to dehydration from vomiting and diarrhea.  You were prescribed medicine to help with the vomiting and diarrhea, but you should follow-up with your primary care doctor for recheck of kidney function in about 1 week.  Please drink plenty of fluids and return to the ER for any new or worsening symptoms.

## 2024-05-03 NOTE — H&P (Signed)
 History and Physical    Roy [name] OZD:[id number] DOB: 08/12/1966 DOA: 05/03/2024  PCP: [name] (Confirm with patient/family/NH records and if not entered, this has to be entered at [facility] point of entry) Patient coming from: Home  I have personally briefly reviewed patient's old medical records in [facility]  Chief Complaint: Feeling weak, N/V diarrhea  HPI: Roy [name] is a 58 y.o. male with medical history significant of cocaine abuse, HIV on HAART, bipolar disorder, CAD, presented with multiple complaints including worsening of nausea vomiting diarrhea, generalized weakness.  Symptoms started yesterday patient started to have nauseous vomiting and abdominal pain diarrhea, he also states that he has some chronic umbilical hernia which repeatedly " bumping up" and hard to reduce by himself.  He also has had multiple loose diarrhea but denied any tenesmus no fever or chills.  He lost appetite completely and did not eat or drink and became dehydrated.  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  Came to ED initially, workup showed no acute findings.  CT abdominal pelvis and blood work showed AKI probably from dehydration.  Patient was given supportive care IV fluid and reassured and initially discharged home from ED however patient immediately came back to ED claiming that he will most felt immediately after leaving the ED.  He continued complaining about feeling nausea diarrhea. Blood work showed creatinine 1.9 compared to baseline 1.1-1.4 BUN 33 WBC 6.1 hemoglobin 14.7.  Review of Systems: As per HPI otherwise 14 point review of systems negative.    Past Medical History:  Diagnosis Date   AIDS (acquired immune deficiency syndrome) (HCC)    Angioedema    ARF (acute renal failure) (HCC)    Arthritis    Asthma    Bipolar disorder (HCC)    BPH (benign prostatic hyperplasia)     Bradycardia    Bronchitis    Cocaine abuse (HCC)    Coronary artery disease    Depression    Dyslipidemia    Dysrhythmia    1st degree heart block/ brady   Genital warts    GERD (gastroesophageal reflux disease)    Hepatitis C    treated   Hernia of abdominal wall    HIV (human immunodeficiency virus infection) (HCC)    HTN (hypertension)    Myocardial infarction (HCC) 2020   pt states stress test showed mild heart attack   Pre-diabetes    Septic hip (HCC)    SVT (supraventricular tachycardia) (HCC)     Past Surgical History:  Procedure Laterality Date   APPLICATION OF WOUND VAC Right 09/01/2019   Procedure: APPLICATION OF WOUND VAC;  Surgeon: Roy Angelucci, MD;  Location: [facility] ORS;  Service: Orthopedics;  Laterality: Right;  Serial # [id number]   HERNIA REPAIR Left    inguinal   TOE SURGERY Right    TOTAL HIP ARTHROPLASTY Right 09/01/2019   Procedure: TOTAL HIP ARTHROPLASTY ANTERIOR APPROACH;  Surgeon: Roy Angelucci, MD;  Location: [facility] ORS;  Service: Orthopedics;  Laterality: Right;     reports that he has been smoking cigarettes. He has a 10 pack-year smoking history. He has never used smokeless tobacco. He reports current alcohol use. He reports current drug use. Drugs: Cocaine and "Crack" cocaine.  Allergies  Allergen Reactions   Amlodipine  Swelling    Of the tongue   Lisinopril  Swelling   Statins Other (See Comments)  Body aches   Bee Pollen Itching and Other (See Comments)    Itchy eyes and runny nose   Lactose Other (See Comments)    GI distress   Pollen Extract Other (See Comments)    Itchy eyes and runny nose   Yellow Jacket Venom Itching    Family History  Problem Relation Age of Onset   Cancer Brother    Uterine cancer Mother    CAD Mother    Hypertension Mother    Hyperlipidemia Mother     Prior to Admission medications   Medication Sig Start Date End Date Taking? Authorizing Provider  BIKTARVY  50-200-25 MG TABS tablet Take 1 tablet by mouth  every morning. 01/14/24  Yes Roy Inks, MD  loperamide (IMODIUM) 2 MG capsule Take 1 capsule (2 mg total) by mouth as needed for diarrhea or loose stools. 05/03/24  Yes Roy Galea, MD  losartan  (COZAAR ) 100 MG tablet Take 100 mg by mouth every morning.   Yes [provider]  ondansetron  (ZOFRAN -ODT) 4 MG disintegrating tablet Take 1 tablet (4 mg total) by mouth every 8 (eight) hours as needed for nausea or vomiting. 05/03/24  Yes Roy Galea, MD  albuterol  (VENTOLIN  HFA) 108 (90 Base) MCG/ACT inhaler Inhale 1-2 puffs into the lungs every 4 (four) hours as needed for wheezing or shortness of breath. 05/23/20   Clapacs, Elida Grounds, MD  cetirizine (ZYRTEC) 10 MG tablet Take 1 tablet by mouth daily. 04/29/23 04/28/24  [provider]  mirtazapine  (REMERON ) 7.5 MG tablet Take 1 tablet (7.5 mg total) by mouth at bedtime. 11/02/20   Roy Duty, MD  risperiDONE  (RISPERDAL ) 1 MG tablet Take 1 tablet (1 mg total) by mouth at bedtime. 11/02/20   Roy Duty, MD  rosuvastatin  (CRESTOR ) 20 MG tablet Take 20 mg by mouth daily.    [provider]    Physical Exam: Vitals:   05/03/24 1130 05/03/24 1145 05/03/24 1315 05/03/24 1339  BP: 107/68   (!) 109/58  Pulse: (!) 48 (!) 55 (!) 59 (!) 55  Resp: 13 (!) 24 11 18   Temp:    98.1 F (36.7 C)  TempSrc:    Oral  SpO2: 99% 98% 99% 99%  Weight:      Height:        Constitutional: NAD, calm, comfortable Vitals:   05/03/24 1130 05/03/24 1145 05/03/24 1315 05/03/24 1339  BP: 107/68   (!) 109/58  Pulse: (!) 48 (!) 55 (!) 59 (!) 55  Resp: 13 (!) 24 11 18   Temp:    98.1 F (36.7 C)  TempSrc:    Oral  SpO2: 99% 98% 99% 99%  Weight:      Height:       Eyes: PERRL, lids and conjunctivae normal ENMT: Mucous membranes are dry. Posterior pharynx clear of any exudate or lesions.Normal dentition.  Neck: normal, supple, no masses, no thyromegaly Respiratory: clear to auscultation bilaterally, no wheezing, no  crackles. Normal respiratory effort. No accessory muscle use.  Cardiovascular: Regular rate and rhythm, no murmurs / rubs / gallops. No extremity edema. 2+ pedal pulses. No carotid bruits.  Abdomen: no tenderness, no masses palpated. No hepatosplenomegaly. Bowel sounds positive.  Musculoskeletal: no clubbing / cyanosis. No joint deformity upper and lower extremities. Good ROM, no contractures. Normal muscle tone.  Skin: no rashes, lesions, ulcers. No induration Neurologic: CN 2-12 grossly intact. Sensation intact, DTR normal. Strength 5/5 in all 4.  Psychiatric: Normal judgment and insight. Alert and oriented x 3.  Normal mood.     Labs on Admission: I have personally reviewed following labs and imaging studies  CBC: Recent Labs  Lab 05/03/24 1004  WBC 6.1  NEUTROABS 3.5  HGB 13.7  HCT 40.6  MCV 98.5  PLT 170   Basic Metabolic Panel: Recent Labs  Lab 05/03/24 1004  NA 138  K 4.4  CL 105  CO2 23  GLUCOSE 99  BUN 33*  CREATININE 1.98*  CALCIUM  8.6*   GFR: Estimated Creatinine Clearance: 40.2 mL/min (A) (by C-G formula based on SCr of 1.98 mg/dL (H)). Liver Function Tests: Recent Labs  Lab 05/03/24 1004  AST 25  ALT 19  ALKPHOS 66  BILITOT 0.8  PROT 7.1  ALBUMIN 3.7   Recent Labs  Lab 05/03/24 1004  LIPASE 23   No results for input(s): "AMMONIA" in the last 168 hours. Coagulation Profile: No results for input(s): "INR", "PROTIME" in the last 168 hours. Cardiac Enzymes: No results for input(s): "CKTOTAL", "CKMB", "CKMBINDEX", "TROPONINI" in the last 168 hours. BNP (last 3 results) No results for input(s): "PROBNP" in the last 8760 hours. HbA1C: No results for input(s): "HGBA1C" in the last 72 hours. CBG: No results for input(s): "GLUCAP" in the last 168 hours. Lipid Profile: No results for input(s): "CHOL", "HDL", "LDLCALC", "TRIG", "CHOLHDL", "LDLDIRECT" in the last 72 hours. Thyroid  Function Tests: No results for input(s): "TSH", "T4TOTAL", "FREET4",  "T3FREE", "THYROIDAB" in the last 72 hours. Anemia Panel: No results for input(s): "VITAMINB12", "FOLATE", "FERRITIN", "TIBC", "IRON", "RETICCTPCT" in the last 72 hours. Urine analysis:    Component Value Date/Time   COLORURINE YELLOW (A) 04/26/2023 1650   APPEARANCEUR CLEAR (A) 04/26/2023 1650   APPEARANCEUR Clear 01/06/2015 1505   LABSPEC 1.026 04/26/2023 1650   LABSPEC 1.023 01/06/2015 1505   PHURINE 5.0 04/26/2023 1650   GLUCOSEU NEGATIVE 04/26/2023 1650   GLUCOSEU Negative 01/06/2015 1505   HGBUR MODERATE (A) 04/26/2023 1650   BILIRUBINUR NEGATIVE 04/26/2023 1650   BILIRUBINUR Negative 01/06/2015 1505   KETONESUR NEGATIVE 04/26/2023 1650   PROTEINUR NEGATIVE 04/26/2023 1650   NITRITE NEGATIVE 04/26/2023 1650   LEUKOCYTESUR NEGATIVE 04/26/2023 1650   LEUKOCYTESUR Negative 01/06/2015 1505    Radiological Exams on Admission: CT ABDOMEN PELVIS W CONTRAST Result Date: 05/03/2024 CLINICAL DATA:  Abdominal pain, acute, nonlocalized Pt arrives via ems from home with c/o ABD pain that began last night. Hx of hernia. Pt presents with CP that began this morning and states he can feel a pulse in his ABD. Denies a tearing sensation. MD reviewed ECG and denies STEMI. Pt AANDOx4. EXAM: CT ABDOMEN AND PELVIS WITH CONTRAST TECHNIQUE: Multidetector CT imaging of the abdomen and pelvis was performed using the standard protocol following bolus administration of intravenous contrast. RADIATION DOSE REDUCTION: This exam was performed according to the departmental dose-optimization program which includes automated exposure control, adjustment of the mA and/or kV according to patient size and/or use of iterative reconstruction technique. CONTRAST:  OMNIPAQUE  IOHEXOL  300 MG/ML  SOLN COMPARISON:  CT abdomen pelvis 04/11/2024 FINDINGS: Lower chest: No acute abnormality. Hepatobiliary: Subcentimeter hypodensities too small to characterize. No gallstones, gallbladder wall thickening, or pericholecystic  fluid. No biliary dilatation. Pancreas: No focal lesion. Normal pancreatic contour. No surrounding inflammatory changes. No main pancreatic ductal dilatation. Spleen: Normal in size without focal abnormality. Adrenals/Urinary Tract: No adrenal nodule bilaterally. Bilateral kidneys enhance symmetrically. No hydronephrosis. No hydroureter. Right nephrolithiasis measuring up to 3 mm. Punctate left nephrolithiasis. No ureterolithiasis bilaterally. Fluid density lesions of the kidneys likely represent  simple renal cysts. Simple renal cysts, in the absence of clinically indicated signs/symptoms, require no independent follow-up. The urinary bladder is unremarkable. Stomach/Bowel: Stomach is within normal limits. No evidence of bowel wall thickening or dilatation. Colonic diverticulosis. Appendix appears normal. Vascular/Lymphatic: The main portal, splenic, superior mesenteric veins are patent. No abdominal aorta or iliac aneurysm. Mild atherosclerotic plaque of the aorta and its branches. No abdominal, pelvic, or inguinal lymphadenopathy. Reproductive: Prostate is unremarkable. Other: No intraperitoneal free fluid. No intraperitoneal free gas. No organized fluid collection. Musculoskeletal: Tiny fat containing umbilical hernia. Total right hip arthroplasty. Severe degenerative changes left hip. No suspicious lytic or blastic osseous lesions. No acute displaced fracture. Grade 2 anterolisthesis of L5 on S1 with associated bilateral L5 pars interarticularis defect, intervertebral disc space vacuum phenomenon, endplate sclerosis. IMPRESSION: 1. Colonic diverticulosis with no acute diverticulitis. 2. Nonobstructive bilateral nephrolithiasis measuring up to 3 mm on the right and punctate on the left. 3. Grade 2 anterolisthesis of L5 on S1 with associated bilateral L5 pars interarticularis defect, intervertebral disc space vacuum phenomenon, endplate sclerosis. 4.  Aortic Atherosclerosis (ICD10-I70.0). Electronically Signed    By: Morgane  Naveau M.D.   On: 05/03/2024 12:07   DG Chest 2 View Result Date: 05/03/2024 CLINICAL DATA:  Chest pain. EXAM: CHEST - 2 VIEW COMPARISON:  June 16, 2023. FINDINGS: Stable cardiomediastinal silhouette. Both lungs are clear. The visualized skeletal structures are unremarkable. IMPRESSION: No active cardiopulmonary disease. Electronically Signed   By: Rosalene Colon M.D.   On: 05/03/2024 10:41    EKG: Independently reviewed.  Sinus bradycardia, no acute ST changes.  Assessment/Plan Principal Problem:   AKI (acute kidney injury) (HCC) Active Problems:   Impaired ambulation  (please populate well all problems here in Problem List. (For example, if patient is on BP meds at home and you resume or decide to hold them, it is a problem that needs to be her. Same for CAD, COPD, HLD and so on)  AKI - Likely prerenal secondary to poor oral intake with ongoing possibly gastroenteritis - Encourage increasing p.o. intake - Short course of IV fluid and recheck kidney function tomorrow  Acute ambulatory impairment Near syncope - Likely secondary to dehydration and volume contraction, IV hydration as above.  Cocaine abuse - Patient expressed interest in drug rehab program.  Will consult case management  Anxiety/depression - Worsening of depression symptoms as per patient.  Will consult psychiatry  HIV on HAART - Patient reports that he has been coherent with Biktarvy  - Most recent HIV follow-up showed CD4> 500 and HIV RNA undetectable  DVT prophylaxis: SCD Code Status: Full code Family Communication: None at present Disposition Plan: Expect less than 2 midnight hospital stay Consults called: Psychiatry Admission status: MedSurg observation   [place] MD [facility] Pager [phone]  05/03/2024, 2:35 PM

## 2024-05-03 NOTE — ED Notes (Signed)
 This RN gave report to [employer] and performed care handoff. Call light in reach, bed wheels locked, side rail raised, pt updated on plan of care. Rounding completed.

## 2024-05-04 ENCOUNTER — Other Ambulatory Visit: Payer: Self-pay

## 2024-05-04 ENCOUNTER — Encounter: Payer: Self-pay | Admitting: Internal Medicine

## 2024-05-04 DIAGNOSIS — R1084 Generalized abdominal pain: Secondary | ICD-10-CM

## 2024-05-04 DIAGNOSIS — K529 Noninfective gastroenteritis and colitis, unspecified: Secondary | ICD-10-CM | POA: Diagnosis not present

## 2024-05-04 DIAGNOSIS — R262 Difficulty in walking, not elsewhere classified: Secondary | ICD-10-CM | POA: Diagnosis not present

## 2024-05-04 DIAGNOSIS — N179 Acute kidney failure, unspecified: Secondary | ICD-10-CM | POA: Diagnosis not present

## 2024-05-04 LAB — URINE DRUG SCREEN, QUALITATIVE (ARMC ONLY)
Amphetamines, Ur Screen: NOT DETECTED
Barbiturates, Ur Screen: NOT DETECTED
Benzodiazepine, Ur Scrn: POSITIVE — AB
Cannabinoid 50 Ng, Ur ~~LOC~~: NOT DETECTED
Cocaine Metabolite,Ur ~~LOC~~: POSITIVE — AB
MDMA (Ecstasy)Ur Screen: NOT DETECTED
Methadone Scn, Ur: NOT DETECTED
Opiate, Ur Screen: POSITIVE — AB
Phencyclidine (PCP) Ur S: NOT DETECTED
Tricyclic, Ur Screen: NOT DETECTED

## 2024-05-04 LAB — BASIC METABOLIC PANEL WITH GFR
Anion gap: 3 — ABNORMAL LOW (ref 5–15)
BUN: 30 mg/dL — ABNORMAL HIGH (ref 6–20)
CO2: 26 mmol/L (ref 22–32)
Calcium: 8.1 mg/dL — ABNORMAL LOW (ref 8.9–10.3)
Chloride: 111 mmol/L (ref 98–111)
Creatinine, Ser: 1.32 mg/dL — ABNORMAL HIGH (ref 0.61–1.24)
GFR, Estimated: >60 mL/min (ref >60)
Glucose, Bld: 83 mg/dL (ref 70–99)
Potassium: 4.5 mmol/L (ref 3.5–5.1)
Sodium: 140 mmol/L (ref 135–145)

## 2024-05-04 LAB — GASTROINTESTINAL PANEL BY PCR, STOOL (REPLACES STOOL CULTURE)

## 2024-05-04 MED ORDER — LOPERAMIDE HCL 2 MG PO CAPS
2.0000 mg | ORAL_CAPSULE | ORAL | 0 refills | Status: DC | PRN
  Filled 2024-05-04: qty 30, 7d supply, fill #0

## 2024-05-04 MED ORDER — LOSARTAN POTASSIUM 100 MG PO TABS: 100.0000 mg | ORAL_TABLET | ORAL | Status: DC

## 2024-05-04 MED ORDER — ONDANSETRON 4 MG PO TBDP
4.0000 mg | ORAL_TABLET | Freq: Three times a day (TID) | ORAL | 0 refills | Status: DC | PRN
  Filled 2024-05-04: qty 20, 7d supply, fill #0

## 2024-05-04 NOTE — Discharge Summary (Signed)
 Physician Discharge Summary   Patient: Roy [name] MRN: [id number] DOB: 1966/05/04  Admit date:     05/03/2024  Discharge date: 05/04/24  Discharge Physician: Luna Salinas   PCP: [facility]   Recommendations at discharge:  Please obtain CBC and BMP on follow-up Please continue counseling for illicit drug use Please ensure that he resume his losartan  in 2 to 3 days in order to prevent increasing blood pressure. Follow-up with primary care provider within a week  Discharge Diagnoses: Principal Problem:   AKI (acute kidney injury) (HCC) Active Problems:   Impaired ambulation   Generalized abdominal pain   Gastroenteritis   Hospital Course: Taken from H&P.  Roy [name] is a 58 y.o. male with medical history significant of cocaine abuse, HIV on HAART, bipolar disorder, CAD, presented with multiple complaints including worsening of nausea vomiting diarrhea, generalized weakness.   Patient was initially discharged from ED after giving some IV fluid and supportive care but immediately returned stating that he is feeling worse.  Hemodynamically stable, labs showed AKI with creatinine at 1.9, baseline around 1.1-1.4, UA with moderate leukocytes and rare bacteria, respiratory panel negative, CT abdominal and pelvis with colonic diverticulosis but no diverticulitis.  Also shows nonobstructive bilateral nephrolithiasis along with Grade 2 anterolisthesis of L5 on S1 with associated bilateral L5 pars interarticularis defect, intervertebral disc space vacuum phenomenon, endplate sclerosis. EKG with sinus bradycardia, T wave inversions in inferolateral leads which seems chronic-no acute abnormality.  Patient was admitted for possible gastroenteritis, IV fluid ordered.  5/19: Vital stable with borderline bradycardia, symptoms started improving, able to tolerate diet and had 1 episode of diarrhea since in the hospital.  GI pathogen panel positive for norovirus.  Patient  likely has viral gastroenteritis which is self-limiting.  He was provided with some Zofran  and Imodium  to use as needed.  He was instructed to hold losartan  for next couple of days and keep himself well-hydrated.  UDS was positive for cocaine, opioids and benzo.  Counseling was provided.  Renal function started improving with creatinine at 1.32 on discharge.  He will continue his current medications and need to have a close follow-up with his primary care provider for further management.   Consultants: None Procedures performed: None Disposition: Home Diet recommendation:  Discharge Diet Orders (From admission, onward)     Start     Ordered   05/04/24 0000  Diet - low sodium heart healthy        05/04/24 1225           Regular diet DISCHARGE MEDICATION: Allergies as of 05/04/2024       Reactions   Amlodipine  Swelling   Of the tongue   Lisinopril  Swelling   Statins Other (See Comments)   Body aches   Bee Pollen Itching, Other (See Comments)   Itchy eyes and runny nose   Lactose Other (See Comments)   GI distress   Pollen Extract Other (See Comments)   Itchy eyes and runny nose   Yellow Jacket Venom Itching        Medication List     TAKE these medications    albuterol  108 (90 Base) MCG/ACT inhaler Commonly known as: VENTOLIN  HFA Inhale 1-2 puffs into the lungs every 4 (four) hours as needed for wheezing or shortness of breath.   Biktarvy  50-200-25 MG Tabs tablet Generic drug: bictegravir-emtricitabine -tenofovir  AF Take 1 tablet by mouth every morning.   cetirizine 10 MG tablet Commonly known as: ZYRTEC Take 1 tablet by mouth  daily.   loperamide  2 MG capsule Commonly known as: IMODIUM  Take 1 capsule (2 mg total) by mouth as needed for diarrhea or loose stools.   losartan  100 MG tablet Commonly known as: COZAAR  Take 1 tablet (100 mg total) by mouth every morning. Hold for next 3 days What changed: additional instructions   mirtazapine  7.5 MG  tablet Commonly known as: REMERON  Take 1 tablet (7.5 mg total) by mouth at bedtime.   ondansetron  4 MG disintegrating tablet Commonly known as: ZOFRAN -ODT Take 1 tablet (4 mg total) by mouth every 8 (eight) hours as needed for nausea or vomiting.   risperiDONE  1 MG tablet Commonly known as: RISPERDAL  Take 1 tablet (1 mg total) by mouth at bedtime.   rosuvastatin  20 MG tablet Commonly known as: CRESTOR  Take 20 mg by mouth daily.        Follow-up Information     [facility] Emergency Department at [facility] .   Specialty: Emergency Medicine Why: If symptoms worsen Contact information: [address] [place]  [address] [phone]        [facility] Schedule an appointment as soon as possible for a visit .   Contact information: [address] [place] [address] [phone]                Discharge Exam: Roy [name] Weights   05/03/24 0959  Weight: 74.8 kg   General.  Well-developed gentleman, in no acute distress. Pulmonary.  Lungs clear bilaterally, normal respiratory effort. CV.  Regular rate and rhythm, no JVD, rub or murmur. Abdomen.  Soft, nontender, nondistended, BS positive. CNS.  Alert and oriented .  No focal neurologic deficit. Extremities.  No edema, no cyanosis, pulses intact and symmetrical. Psychiatry.  Judgment and insight appears normal.   Condition at discharge: stable  The results of significant diagnostics from this hospitalization (including imaging, microbiology, ancillary and laboratory) are listed below for reference.   Imaging Studies: CT ABDOMEN PELVIS W CONTRAST Result Date: 05/03/2024 CLINICAL DATA:  Abdominal pain, acute, nonlocalized Pt arrives via ems from home with c/o ABD pain that began last night. Hx of hernia. Pt presents with CP that began this morning and states he can feel a pulse in his ABD. Denies a tearing sensation. MD reviewed ECG and denies STEMI. Pt AANDOx4. EXAM: CT  ABDOMEN AND PELVIS WITH CONTRAST TECHNIQUE: Multidetector CT imaging of the abdomen and pelvis was performed using the standard protocol following bolus administration of intravenous contrast. RADIATION DOSE REDUCTION: This exam was performed according to the departmental dose-optimization program which includes automated exposure control, adjustment of the mA and/or kV according to patient size and/or use of iterative reconstruction technique. CONTRAST:  OMNIPAQUE  IOHEXOL  300 MG/ML  SOLN COMPARISON:  CT abdomen pelvis 04/11/2024 FINDINGS: Lower chest: No acute abnormality. Hepatobiliary: Subcentimeter hypodensities too small to characterize. No gallstones, gallbladder wall thickening, or pericholecystic fluid. No biliary dilatation. Pancreas: No focal lesion. Normal pancreatic contour. No surrounding inflammatory changes. No main pancreatic ductal dilatation. Spleen: Normal in size without focal abnormality. Adrenals/Urinary Tract: No adrenal nodule bilaterally. Bilateral kidneys enhance symmetrically. No hydronephrosis. No hydroureter. Right nephrolithiasis measuring up to 3 mm. Punctate left nephrolithiasis. No ureterolithiasis bilaterally. Fluid density lesions of the kidneys likely represent simple renal cysts. Simple renal cysts, in the absence of clinically indicated signs/symptoms, require no independent follow-up. The urinary bladder is unremarkable. Stomach/Bowel: Stomach is within normal limits. No evidence of bowel wall thickening or dilatation. Colonic diverticulosis. Appendix appears normal. Vascular/Lymphatic:  The main portal, splenic, superior mesenteric veins are patent. No abdominal aorta or iliac aneurysm. Mild atherosclerotic plaque of the aorta and its branches. No abdominal, pelvic, or inguinal lymphadenopathy. Reproductive: Prostate is unremarkable. Other: No intraperitoneal free fluid. No intraperitoneal free gas. No organized fluid collection. Musculoskeletal: Tiny fat containing  umbilical hernia. Total right hip arthroplasty. Severe degenerative changes left hip. No suspicious lytic or blastic osseous lesions. No acute displaced fracture. Grade 2 anterolisthesis of L5 on S1 with associated bilateral L5 pars interarticularis defect, intervertebral disc space vacuum phenomenon, endplate sclerosis. IMPRESSION: 1. Colonic diverticulosis with no acute diverticulitis. 2. Nonobstructive bilateral nephrolithiasis measuring up to 3 mm on the right and punctate on the left. 3. Grade 2 anterolisthesis of L5 on S1 with associated bilateral L5 pars interarticularis defect, intervertebral disc space vacuum phenomenon, endplate sclerosis. 4.  Aortic Atherosclerosis (ICD10-I70.0). Electronically Signed   By: Morgane  Naveau M.D.   On: 05/03/2024 12:07   DG Chest 2 View Result Date: 05/03/2024 CLINICAL DATA:  Chest pain. EXAM: CHEST - 2 VIEW COMPARISON:  June 16, 2023. FINDINGS: Stable cardiomediastinal silhouette. Both lungs are clear. The visualized skeletal structures are unremarkable. IMPRESSION: No active cardiopulmonary disease. Electronically Signed   By: Rosalene Colon M.D.   On: 05/03/2024 10:41   CT ABDOMEN PELVIS W CONTRAST Result Date: 04/11/2024 CLINICAL DATA:  Abdominal wall hernia suspected. Nausea. Pain in bilateral lower abdomen after lifting furniture. EXAM: CT ABDOMEN AND PELVIS WITH CONTRAST TECHNIQUE: Multidetector CT imaging of the abdomen and pelvis was performed using the standard protocol following bolus administration of intravenous contrast. RADIATION DOSE REDUCTION: This exam was performed according to the departmental dose-optimization program which includes automated exposure control, adjustment of the mA and/or kV according to patient size and/or use of iterative reconstruction technique. CONTRAST:  75mL OMNIPAQUE  IOHEXOL  300 MG/ML  SOLN COMPARISON:  09/24/2020.  Triage note from today's ED visit. FINDINGS: Lower chest: Clear lung bases. Normal heart size without  pericardial or pleural effusion. Hepatobiliary: Tiny well-circumscribed low-density liver lesions are too small to characterize but similar to the prior and likely cysts and bile duct hamartomas. Normal gallbladder, without biliary ductal dilatation. Pancreas: Normal, without mass or ductal dilatation. Spleen: Normal in size, without focal abnormality. Adrenals/Urinary Tract: Normal adrenal glands. Bilateral punctate renal collecting system calculi. Upper pole right renal 1.3 cm cyst. Left renal too small to characterize lesion is most likely a cyst. No specific follow-up indicated. No hydronephrosis. Degraded evaluation of the pelvis, secondary to beam hardening artifact from right hip arthroplasty. No gross bladder abnormality. Stomach/Bowel: Normal stomach, without wall thickening. Scattered colonic diverticula. Normal appendix. Normal small bowel. Vascular/Lymphatic: Aortic atherosclerosis. No abdominal or upper pelvic adenopathy. Reproductive: Prostate poorly evaluated. Other: No gross free pelvic fluid. No abdominal ascites or free intraperitoneal air. Tiny fat containing periumbilical hernia on 40/3 is similar. Musculoskeletal: Right hip arthroplasty. Moderate left hip osteoarthritis. L5 pars defects with grade 2 L5-S1 anterolisthesis. Advanced degenerative disc disease. IMPRESSION: 1.  No acute process in the abdomen or pelvis. 2. Similar tiny fat containing ventral abdominal wall hernia. 3. Bilateral nonobstructive nephrolithiasis. 4. Degraded evaluation of the pelvis, secondary to beam hardening artifact from right hip arthroplasty. 5.  Aortic Atherosclerosis (ICD10-I70.0). Electronically Signed   By: Lore Rode M.D.   On: 04/11/2024 16:27    Microbiology: Results for orders placed or performed during the hospital encounter of 05/03/24  Resp panel by RT-PCR (RSV, Flu A&B, Covid) Anterior Nasal Swab     Status: None  Collection Time: 05/03/24 10:45 AM   Specimen: Anterior Nasal Swab  Result Value  Ref Range Status   SARS Coronavirus 2 by RT PCR NEGATIVE NEGATIVE Final    Comment: (NOTE) SARS-CoV-2 target nucleic acids are NOT DETECTED.  The SARS-CoV-2 RNA is generally detectable in upper respiratory specimens during the acute phase of infection. The lowest concentration of SARS-CoV-2 viral copies this assay can detect is 138 copies/mL. A negative result does not preclude SARS-Cov-2 infection and should not be used as the sole basis for treatment or other patient management decisions. A negative result may occur with  improper specimen collection/handling, submission of specimen other than nasopharyngeal swab, presence of viral mutation(s) within the areas targeted by this assay, and inadequate number of viral copies(<138 copies/mL). A negative result must be combined with clinical observations, patient history, and epidemiological information. The expected result is Negative.  Fact Sheet for Patients:  BloggerCourse.com  Fact Sheet for Healthcare Providers:  SeriousBroker.it  This test is no t yet approved or cleared by the [place]  FDA and  has been authorized for detection and/or diagnosis of SARS-CoV-2 by FDA under an Emergency Use Authorization (EUA). This EUA will remain  in effect (meaning this test can be used) for the duration of the COVID-19 declaration under Section 564(b)(1) of the Act, 21 U.S.C.section 360bbb-3(b)(1), unless the authorization is terminated  or revoked sooner.       Influenza A by PCR NEGATIVE NEGATIVE Final   Influenza B by PCR NEGATIVE NEGATIVE Final    Comment: (NOTE) The Xpert Xpress SARS-CoV-2/FLU/RSV plus assay is intended as an aid in the diagnosis of influenza from Nasopharyngeal swab specimens and should not be used as a sole basis for treatment. Nasal washings and aspirates are unacceptable for Xpert Xpress SARS-CoV-2/FLU/RSV testing.  Fact Sheet for  Patients: BloggerCourse.com  Fact Sheet for Healthcare Providers: SeriousBroker.it  This test is not yet approved or cleared by the [place]  FDA and has been authorized for detection and/or diagnosis of SARS-CoV-2 by FDA under an Emergency Use Authorization (EUA). This EUA will remain in effect (meaning this test can be used) for the duration of the COVID-19 declaration under Section 564(b)(1) of the Act, 21 U.S.C. section 360bbb-3(b)(1), unless the authorization is terminated or revoked.     Resp Syncytial Virus by PCR NEGATIVE NEGATIVE Final    Comment: (NOTE) Fact Sheet for Patients: BloggerCourse.com  Fact Sheet for Healthcare Providers: SeriousBroker.it  This test is not yet approved or cleared by the [place]  FDA and has been authorized for detection and/or diagnosis of SARS-CoV-2 by FDA under an Emergency Use Authorization (EUA). This EUA will remain in effect (meaning this test can be used) for the duration of the COVID-19 declaration under Section 564(b)(1) of the Act, 21 U.S.C. section 360bbb-3(b)(1), unless the authorization is terminated or revoked.  Performed at [facility], [address] [place], [place] [address]   Gastrointestinal Panel by PCR , Stool     Status: Abnormal   Collection Time: 05/03/24  2:15 PM   Specimen: Stool  Result Value Ref Range Status   Campylobacter species NOT DETECTED NOT DETECTED Final   Plesimonas shigelloides NOT DETECTED NOT DETECTED Final   Salmonella species NOT DETECTED NOT DETECTED Final   Yersinia enterocolitica NOT DETECTED NOT DETECTED Final   Vibrio species NOT DETECTED NOT DETECTED Final   Vibrio cholerae NOT DETECTED NOT DETECTED Final   Enteroaggregative E coli (EAEC) NOT DETECTED NOT DETECTED Final   Enteropathogenic E coli (  EPEC) NOT DETECTED NOT DETECTED Final   Enterotoxigenic E coli (ETEC)  NOT DETECTED NOT DETECTED Final   Shiga like toxin producing E coli (STEC) NOT DETECTED NOT DETECTED Final   Shigella/Enteroinvasive E coli (EIEC) NOT DETECTED NOT DETECTED Final   Cryptosporidium NOT DETECTED NOT DETECTED Final   Cyclospora cayetanensis NOT DETECTED NOT DETECTED Final   Entamoeba histolytica NOT DETECTED NOT DETECTED Final   Giardia lamblia NOT DETECTED NOT DETECTED Final   Adenovirus F40/41 NOT DETECTED NOT DETECTED Final   Astrovirus NOT DETECTED NOT DETECTED Final   Norovirus GI/GII DETECTED (A) NOT DETECTED Final    Comment: RESULT CALLED TO, READ BACK BY AND VERIFIED WITH: ANGELA R RN 1145 05/04/24 HNM    Rotavirus A NOT DETECTED NOT DETECTED Final   Sapovirus (I, II, IV, and V) NOT DETECTED NOT DETECTED Final    Comment: Performed at [facility], [address] [place], [place] [address]    Labs: CBC: Recent Labs  Lab 05/03/24 1004  WBC 6.1  NEUTROABS 3.5  HGB 13.7  HCT 40.6  MCV 98.5  PLT 170   Basic Metabolic Panel: Recent Labs  Lab 05/03/24 1004 05/04/24 0746  NA 138 140  K 4.4 4.5  CL 105 111  CO2 23 26  GLUCOSE 99 83  BUN 33* 30*  CREATININE 1.98* 1.32*  CALCIUM  8.6* 8.1*   Liver Function Tests: Recent Labs  Lab 05/03/24 1004  AST 25  ALT 19  ALKPHOS 66  BILITOT 0.8  PROT 7.1  ALBUMIN 3.7   CBG: No results for input(s): "GLUCAP" in the last 168 hours.  Discharge time spent: greater than 30 minutes.  This record has been created using Conservation officer, historic buildings. Errors have been sought and corrected,but may not always be located. Such creation errors do not reflect on the standard of care.   Signed: Luna Salinas, MD [facility] 05/04/2024

## 2024-05-04 NOTE — TOC Transition Note (Signed)
 Transition of Care [facility]) - Discharge Note   Patient Details  Name: Roy [name] MRN: [id number] Date of Birth: 04-03-1966  Transition of [facility] CM/SW Contact:  Elsie Halo, RN Phone Number: 05/04/2024, 2:18 PM   Clinical Narrative:     Patient is medically clear for discharge to home with no outpatient or [facility] PT/OT needs. The patient doesn't have a way to get home from the hospital. [facility] provided a TAXI voucher.        Patient Goals and CMS Choice            Discharge Placement                       Discharge Plan and Services Additional resources added to the After Visit Summary for                                       Social Drivers of Health (SDOH) Interventions SDOH Screenings   Food Insecurity: Unknown (05/04/2024)  Housing: Patient Declined (05/04/2024)  Transportation Needs: Patient Declined (05/04/2024)  Utilities: Patient Declined (05/04/2024)  Alcohol Screen: Low Risk  (11/01/2020)  Depression (PHQ2-9): Low Risk  (01/14/2024)  Financial Resource Strain: Medium Risk (01/20/2024)   Received from [facility]  Tobacco Use: High Risk (05/04/2024)  Health Literacy: Low Risk  (07/02/2022)   Received from [facility]     Readmission Risk Interventions     No data to display

## 2024-05-04 NOTE — Evaluation (Signed)
 Physical Therapy Evaluation Patient Details Name: Roy [name] MRN: [id number] DOB: Aug 25, 1966 Today's Date: 05/04/2024  History of Present Illness  Roy [name] is a 58 y.o. male with medical history significant of cocaine abuse, HIV on HAART, bipolar disorder, CAD, presented with multiple complaints including worsening of nausea vomiting diarrhea, generalized weakness.   Clinical Impression  Pt admitted with above diagnosis. Pt currently with functional limitations due to the deficits listed below (see PT Problem List). Pt received upright in bed agreeable to PT eval. Reports PTA being independent. Has HH services through insurance that assists with transportation to medical appointments and apartment upkeep. Typically independent with gait and ADL's. Sometimes uses RW due to baseline R hip OA related pain.   To date, pt is independent for bed mobility, STS, and mod-I for gait in hallway pushing IV pole. Pt with anterior trunk lean onto IV pole but pt reports this is due to his hernia pain and this position alleviates it. Pt completing ~200' of gait endorsing need for BM and left in bathroom. Informed pt to use call bell once done to manage IV pole and to inform RN/NT for stool sample. Pt understanding, NT updated. Pt appears at his baseline with no acute PT needs. Will keep on mobility caseload during admission to prevent acute deconditioning. PT to sign off in house.      If plan is discharge home, recommend the following: Assist for transportation   Can travel by private vehicle        Equipment Recommendations    Recommendations for Other Services       Functional Status Assessment Patient has not had a recent decline in their functional status     Precautions / Restrictions Precautions Precautions: None Recall of Precautions/Restrictions: Intact Restrictions Weight Bearing Restrictions Per Provider Order: No      Mobility  Bed Mobility Overal bed mobility:  Independent               Patient Response: Cooperative  Transfers Overall transfer level: Independent                      Ambulation/Gait Ambulation/Gait assistance: Modified independent (Device/Increase time) Gait Distance (Feet): 200 Feet Assistive device: IV Pole Gait Pattern/deviations: WFL(Within Functional Limits)       General Gait Details: anterior trunk lean on IV pole stating this alleviates his pain from his hernia  Stairs            Wheelchair Mobility     Tilt Bed Tilt Bed Patient Response: Cooperative  Modified Rankin (Stroke Patients Only)       Balance Overall balance assessment: Mild deficits observed, not formally tested                                           Pertinent Vitals/Pain Pain Assessment Pain Assessment: Faces Faces Pain Scale: Hurts a little bit Pain Location: generalized arthritis pain, hernia pain Pain Descriptors / Indicators: Discomfort, Grimacing Pain Intervention(s): Limited activity within patient's tolerance, Monitored during session    Home Living Family/patient expects to be discharged to:: Private residence Living Arrangements: Alone   Type of Home: Apartment Home Access: Level entry       Home Layout: One level Home Equipment: Agricultural consultant (2 wheels)      Prior Function Prior Level of Function : Independent/Modified Independent  Mobility Comments: sometimes will use RW due to R hip arthritic pain       Extremity/Trunk Assessment   Upper Extremity Assessment Upper Extremity Assessment: Overall WFL for tasks assessed    Lower Extremity Assessment Lower Extremity Assessment: Overall WFL for tasks assessed       Communication   Communication Communication: No apparent difficulties    Cognition Arousal: Alert Behavior During Therapy: WFL for tasks assessed/performed   PT - Cognitive impairments: No apparent impairments                          Following commands: Intact       Cueing       General Comments      Exercises     Assessment/Plan    PT Assessment Patient does not need any further PT services  PT Problem List         PT Treatment Interventions      PT Goals (Current goals can be found in the Care Plan section)  Acute Rehab PT Goals PT Goal Formulation: All assessment and education complete, DC therapy    Frequency       Co-evaluation               AM-PAC PT "6 Clicks" Mobility  Outcome Measure Help needed turning from your back to your side while in a flat bed without using bedrails?: None Help needed moving from lying on your back to sitting on the side of a flat bed without using bedrails?: None Help needed moving to and from a bed to a chair (including a wheelchair)?: None Help needed standing up from a chair using your arms (e.g., wheelchair or bedside chair)?: None Help needed to walk in hospital room?: A Little Help needed climbing 3-5 steps with a railing? : A Little 6 Click Score: 22    End of Session   Activity Tolerance: Patient tolerated treatment well Patient left: with call bell/phone within reach (in bathroom) Nurse Communication: Mobility status PT Visit Diagnosis: Muscle weakness (generalized) (M62.81)    Time: 9147-8295 PT Time Calculation (min) (ACUTE ONLY): 13 min   Charges:   PT Evaluation $PT Eval Low Complexity: 1 Low   PT General Charges $$ ACUTE PT VISIT: 1 Visit        Marc Senior. Fairly IV, PT, DPT Physical Therapist- [facility]  [facility]  05/04/2024, 9:25 AM

## 2024-05-04 NOTE — Hospital Course (Addendum)
 Taken from H&P.  Roy [name] is a 58 y.o. male with medical history significant of cocaine abuse, HIV on HAART, bipolar disorder, CAD, presented with multiple complaints including worsening of nausea vomiting diarrhea, generalized weakness.   Patient was initially discharged from ED after giving some IV fluid and supportive care but immediately returned stating that he is feeling worse.  Hemodynamically stable, labs showed AKI with creatinine at 1.9, baseline around 1.1-1.4, UA with moderate leukocytes and rare bacteria, respiratory panel negative, CT abdominal and pelvis with colonic diverticulosis but no diverticulitis.  Also shows nonobstructive bilateral nephrolithiasis along with Grade 2 anterolisthesis of L5 on S1 with associated bilateral L5 pars interarticularis defect, intervertebral disc space vacuum phenomenon, endplate sclerosis. EKG with sinus bradycardia, T wave inversions in inferolateral leads which seems chronic-no acute abnormality.  Patient was admitted for possible gastroenteritis, IV fluid ordered.  5/19: Vital stable with borderline bradycardia, symptoms started improving, able to tolerate diet and had 1 episode of diarrhea since in the hospital.  GI pathogen panel positive for norovirus.  Patient likely has viral gastroenteritis which is self-limiting.  He was provided with some Zofran  and Imodium  to use as needed.  He was instructed to hold losartan  for next couple of days and keep himself well-hydrated.  UDS was positive for cocaine, opioids and benzo.  Counseling was provided.  Renal function started improving with creatinine at 1.32 on discharge.  He will continue his current medications and need to have a close follow-up with his primary care provider for further management.

## 2024-05-04 NOTE — Progress Notes (Signed)
 Introduced patient to role of Statistician. Intake questions completed.  Patient is living in an apartment. He has a newly established ACTT team, along with a peer support specialist.   Information on [facility] [place] provided to patient, as he was previously incarcerated and has an upcoming court date this week.  Patient states he receives disability each month, but does endorse some food insecurity. Resources provided.  Uses Medicaid transportation to get to/from appointments.  Patient does report having a PCP.   Encouraged to call with questions or concerns.

## 2024-05-27 ENCOUNTER — Emergency Department: Admit: 2024-05-27 | Discharge: 2024-05-27 | Disposition: A | Discharge status: Home / Self Care | Payer: MEDICAID

## 2024-05-27 ENCOUNTER — Ambulatory Visit
Admit: 2024-05-27 | Discharge: 2024-05-27 | Disposition: A | Discharge status: Home / Self Care | Payer: MEDICAID | Primary: Student in an Organized Health Care Education/Training Program

## 2024-05-27 DIAGNOSIS — F1490 Cocaine use, unspecified, uncomplicated: Principal | ICD-10-CM

## 2024-05-27 DIAGNOSIS — B20 Human immunodeficiency virus [HIV] disease: Principal | ICD-10-CM

## 2024-05-27 DIAGNOSIS — Z1321 Encounter for screening for nutritional disorder: Principal | ICD-10-CM

## 2024-05-27 DIAGNOSIS — Z79899 Other long term (current) drug therapy: Principal | ICD-10-CM

## 2024-05-27 DIAGNOSIS — Z13220 Encounter for screening for lipoid disorders: Principal | ICD-10-CM

## 2024-05-27 DIAGNOSIS — Z125 Encounter for screening for malignant neoplasm of prostate: Principal | ICD-10-CM

## 2024-05-27 DIAGNOSIS — Z21 Asymptomatic human immunodeficiency virus [HIV] infection status: Principal | ICD-10-CM

## 2024-05-27 DIAGNOSIS — R0602 Shortness of breath: Principal | ICD-10-CM

## 2024-05-27 DIAGNOSIS — Z1210 Encounter for screening for malignant neoplasm of intestinal tract, unspecified: Principal | ICD-10-CM

## 2024-05-27 DIAGNOSIS — Z122 Encounter for screening for malignant neoplasm of respiratory organs: Principal | ICD-10-CM

## 2024-05-27 DIAGNOSIS — R45851 Suicidal ideations: Principal | ICD-10-CM

## 2024-05-27 DIAGNOSIS — H269 Unspecified cataract: Principal | ICD-10-CM

## 2024-05-27 DIAGNOSIS — Z9189 Other specified personal risk factors, not elsewhere classified: Principal | ICD-10-CM

## 2024-05-27 DIAGNOSIS — M1612 Unilateral primary osteoarthritis, left hip: Principal | ICD-10-CM

## 2024-05-27 DIAGNOSIS — Z1159 Encounter for screening for other viral diseases: Principal | ICD-10-CM

## 2024-05-27 DIAGNOSIS — Z1151 Encounter for screening for human papillomavirus (HPV): Principal | ICD-10-CM

## 2024-05-27 DIAGNOSIS — F1990 Other psychoactive substance use, unspecified, uncomplicated: Principal | ICD-10-CM

## 2024-05-27 DIAGNOSIS — Z5181 Encounter for therapeutic drug level monitoring: Principal | ICD-10-CM

## 2024-05-27 DIAGNOSIS — Z113 Encounter for screening for infections with a predominantly sexual mode of transmission: Principal | ICD-10-CM

## 2024-06-03 ENCOUNTER — Emergency Department
Admission: EM | Admit: 2024-06-03 | Discharge: 2024-06-03 | Disposition: A | Discharge status: Other Acute Inpatient Hospital | Payer: MEDICAID | Attending: Emergency Medicine | Admitting: Emergency Medicine

## 2024-06-03 ENCOUNTER — Other Ambulatory Visit (HOSPITAL_COMMUNITY)
Admission: EM | Admit: 2024-06-03 | Discharge: 2024-06-08 | Disposition: A | Discharge status: Home / Self Care | Payer: MEDICAID | Attending: Psychiatry | Admitting: Psychiatry

## 2024-06-03 ENCOUNTER — Other Ambulatory Visit: Payer: Self-pay

## 2024-06-03 DIAGNOSIS — Z8249 Family history of ischemic heart disease and other diseases of the circulatory system: Secondary | ICD-10-CM | POA: Diagnosis not present

## 2024-06-03 DIAGNOSIS — Y900 Blood alcohol level of less than 20 mg/100 ml: Secondary | ICD-10-CM | POA: Insufficient documentation

## 2024-06-03 DIAGNOSIS — I10 Essential (primary) hypertension: Secondary | ICD-10-CM | POA: Diagnosis not present

## 2024-06-03 DIAGNOSIS — E785 Hyperlipidemia, unspecified: Secondary | ICD-10-CM | POA: Diagnosis not present

## 2024-06-03 DIAGNOSIS — B20 Human immunodeficiency virus [HIV] disease: Secondary | ICD-10-CM

## 2024-06-03 DIAGNOSIS — F1990 Other psychoactive substance use, unspecified, uncomplicated: Secondary | ICD-10-CM

## 2024-06-03 DIAGNOSIS — F1410 Cocaine abuse, uncomplicated: Secondary | ICD-10-CM | POA: Insufficient documentation

## 2024-06-03 DIAGNOSIS — F1920 Other psychoactive substance dependence, uncomplicated: Secondary | ICD-10-CM | POA: Diagnosis present

## 2024-06-03 DIAGNOSIS — F1010 Alcohol abuse, uncomplicated: Secondary | ICD-10-CM | POA: Diagnosis present

## 2024-06-03 DIAGNOSIS — Z809 Family history of malignant neoplasm, unspecified: Secondary | ICD-10-CM | POA: Insufficient documentation

## 2024-06-03 DIAGNOSIS — Z21 Asymptomatic human immunodeficiency virus [HIV] infection status: Secondary | ICD-10-CM | POA: Insufficient documentation

## 2024-06-03 DIAGNOSIS — F1414 Cocaine abuse with cocaine-induced mood disorder: Secondary | ICD-10-CM | POA: Insufficient documentation

## 2024-06-03 DIAGNOSIS — F1020 Alcohol dependence, uncomplicated: Secondary | ICD-10-CM

## 2024-06-03 DIAGNOSIS — Z720 Tobacco use: Secondary | ICD-10-CM

## 2024-06-03 DIAGNOSIS — Z79899 Other long term (current) drug therapy: Secondary | ICD-10-CM | POA: Insufficient documentation

## 2024-06-03 LAB — URINE DRUG SCREEN, QUALITATIVE (ARMC ONLY)
Amphetamines, Ur Screen: NOT DETECTED
Barbiturates, Ur Screen: NOT DETECTED
Benzodiazepine, Ur Scrn: NOT DETECTED
Cannabinoid 50 Ng, Ur ~~LOC~~: NOT DETECTED
Cocaine Metabolite,Ur ~~LOC~~: POSITIVE — AB
MDMA (Ecstasy)Ur Screen: NOT DETECTED
Methadone Scn, Ur: NOT DETECTED
Opiate, Ur Screen: NOT DETECTED
Phencyclidine (PCP) Ur S: NOT DETECTED
Tricyclic, Ur Screen: NOT DETECTED

## 2024-06-03 LAB — CBC
HCT: 44.2 % (ref 39.0–52.0)
Hemoglobin: 15.3 g/dL (ref 13.0–17.0)
MCH: 33.6 pg (ref 26.0–34.0)
MCHC: 34.6 g/dL (ref 30.0–36.0)
MCV: 97.1 fL (ref 80.0–100.0)
Platelets: 195 10*3/uL (ref 150–400)
RBC: 4.55 MIL/uL (ref 4.22–5.81)
RDW: 12.2 % (ref 11.5–15.5)
WBC: 6.4 10*3/uL (ref 4.0–10.5)
nRBC: 0 % (ref 0.0–0.2)

## 2024-06-03 LAB — COMPREHENSIVE METABOLIC PANEL WITH GFR
ALT: 17 U/L (ref 0–44)
AST: 20 U/L (ref 15–41)
Albumin: 4.3 g/dL (ref 3.5–5.0)
Alkaline Phosphatase: 70 U/L (ref 38–126)
Anion gap: 9 (ref 5–15)
BUN: 23 mg/dL — ABNORMAL HIGH (ref 6–20)
CO2: 25 mmol/L (ref 22–32)
Calcium: 9 mg/dL (ref 8.9–10.3)
Chloride: 104 mmol/L (ref 98–111)
Creatinine, Ser: 1.4 mg/dL — ABNORMAL HIGH (ref 0.61–1.24)
GFR, Estimated: 58 mL/min — ABNORMAL LOW (ref >60)
Glucose, Bld: 101 mg/dL — ABNORMAL HIGH (ref 70–99)
Potassium: 4.1 mmol/L (ref 3.5–5.1)
Sodium: 138 mmol/L (ref 135–145)
Total Bilirubin: 0.6 mg/dL (ref 0.0–1.2)
Total Protein: 7.7 g/dL (ref 6.5–8.1)

## 2024-06-03 LAB — ETHANOL: Alcohol, Ethyl (B): <15 mg/dL (ref <15)

## 2024-06-03 MED ORDER — LOSARTAN POTASSIUM 50 MG PO TABS
100.0000 mg | ORAL_TABLET | Freq: Every morning | ORAL | Status: DC
  Administered 2024-06-04 – 2024-06-05 (×2): 100 mg via ORAL
  Filled 2024-06-03 (×2): qty 2

## 2024-06-03 MED ORDER — HALOPERIDOL 5 MG PO TABS: 5.0000 mg | ORAL_TABLET | Freq: Three times a day (TID) | ORAL | Status: DC | PRN

## 2024-06-03 MED ORDER — DIPHENHYDRAMINE HCL 50 MG PO CAPS: 50.0000 mg | ORAL_CAPSULE | Freq: Three times a day (TID) | ORAL | Status: DC | PRN

## 2024-06-03 MED ORDER — HALOPERIDOL LACTATE 5 MG/ML IJ SOLN: 5.0000 mg | Freq: Three times a day (TID) | INTRAMUSCULAR | Status: DC | PRN

## 2024-06-03 MED ORDER — DIPHENHYDRAMINE HCL 50 MG/ML IJ SOLN: 50.0000 mg | Freq: Three times a day (TID) | INTRAMUSCULAR | Status: DC | PRN

## 2024-06-03 MED ORDER — ALUM & MAG HYDROXIDE-SIMETH 200-200-20 MG/5ML PO SUSP: 30.0000 mL | ORAL | Status: DC | PRN

## 2024-06-03 MED ORDER — HALOPERIDOL LACTATE 5 MG/ML IJ SOLN: 10.0000 mg | Freq: Three times a day (TID) | INTRAMUSCULAR | Status: DC | PRN

## 2024-06-03 MED ORDER — NICOTINE 14 MG/24HR TD PT24: 14.0000 mg | MEDICATED_PATCH | Freq: Every day | TRANSDERMAL | Status: DC

## 2024-06-03 MED ORDER — MAGNESIUM HYDROXIDE 400 MG/5ML PO SUSP: 30.0000 mL | Freq: Every day | ORAL | Status: DC | PRN

## 2024-06-03 MED ORDER — ACETAMINOPHEN 325 MG PO TABS
650.0000 mg | ORAL_TABLET | Freq: Four times a day (QID) | ORAL | Status: DC | PRN
  Administered 2024-06-05 – 2024-06-06 (×3): 650 mg via ORAL
  Filled 2024-06-03 (×3): qty 2

## 2024-06-03 MED ORDER — LORAZEPAM 2 MG/ML IJ SOLN: 2.0000 mg | Freq: Three times a day (TID) | INTRAMUSCULAR | Status: DC | PRN

## 2024-06-03 MED ORDER — TRAZODONE HCL 50 MG PO TABS
50.0000 mg | ORAL_TABLET | Freq: Every evening | ORAL | Status: DC | PRN
  Administered 2024-06-03 – 2024-06-04 (×2): 50 mg via ORAL
  Filled 2024-06-03 (×2): qty 1

## 2024-06-03 MED ORDER — HYDROXYZINE HCL 25 MG PO TABS
25.0000 mg | ORAL_TABLET | Freq: Three times a day (TID) | ORAL | Status: DC | PRN
  Administered 2024-06-05 – 2024-06-06 (×2): 25 mg via ORAL
  Filled 2024-06-03 (×2): qty 1

## 2024-06-03 NOTE — ED Notes (Signed)
 Patient is sleeping. Respirations equal and unlabored. No change in assessment or acuity. Routine safety checks conducted according to facility protocol.

## 2024-06-03 NOTE — ED Notes (Signed)
 EMTALA Reviewed by this RN.

## 2024-06-03 NOTE — ED Notes (Signed)
 Patient admitted to [facility] for detox from etoh and crack cocaine.  Patient has a bed on Friday at [facility] however they wanted him to detox first.  His CIWA is 0  He is calm and pleasant without distress.  He was oriented to the unit and shown to his room.  Presently he is eating dinner.  He denies avh shi or plan.

## 2024-06-03 NOTE — ED Notes (Signed)
 Report called to Lorane Rocker rn at facility based crisis.

## 2024-06-03 NOTE — BH Assessment (Signed)
 Comprehensive Clinical Assessment (CCA) Screening, Triage and Referral Note  06/03/2024 Roy [name] [id number]  Chief Complaint:  Chief Complaint  Patient presents with   Drug / Alcohol Assessment   Visit Diagnosis: Substance Abuse Treatment  Roy [name] is a 58 year old male who presents to the ER seeking assistance for detox and substance abuse treatment. He reports of using alcohol and cocaine on a daily basis. Last time of use was yesterday. He drinks approximately a six pack of beer a day. He uses approximately $150 worth of cocaine three to four times a week. Symptoms of withdrawal are; shakes, anxiety and cold chills. Patient denies SI/HI and AV/H. He has a court date at the end of July for shoplifting.  Patient Reported Information How did you hear about us ? Self  What Is the Reason for Your Visit/Call Today? Seeking help with detox; alcohol & cocaine.  How Long Has This Been Causing You Problems? > than 6 months  What Do You Feel Would Help You the Most Today? Alcohol or Drug Use Treatment   Have You Recently Had Any Thoughts About Hurting Yourself? No  Are You Planning to Commit Suicide/Harm Yourself At This time? No   Have you Recently Had Thoughts About Hurting Someone Roy [name]? No  Are You Planning to Harm Someone at This Time? No  Explanation: n/a   Have You Used Any Alcohol or Drugs in the Past 24 Hours? Yes  How Long Ago Did You Use Drugs or Alcohol? Daily  What Did You Use and How Much? $150 worth of crack; unknown amount of alcohol   Do You Currently Have a Therapist/Psychiatrist? No  Name of Therapist/Psychiatrist: n/a   Have You Been Recently Discharged From Any Office Practice or Programs? No  Explanation of Discharge From Practice/Program: n/a    CCA Screening Triage Referral Assessment Type of Contact: Face-to-Face  Telemedicine Service Delivery:   Is this Initial or Reassessment?   Date Telepsych consult ordered in [facility]:     Time Telepsych consult ordered in [facility]:    Location of Assessment: [facility] ED  Provider Location: [facility] ED    Collateral Involvement: Roy [name] [phone]) brother   Does Patient Have a Automotive engineer Guardian? No data recorded Name and Contact of Legal Guardian: No data recorded If Minor and Not Living with Parent(s), Who has Custody? n/a  Is CPS involved or ever been involved? Never  Is APS involved or ever been involved? Never   Patient Determined To Be At Risk for Harm To Self or Others Based on Review of Patient Reported Information or Presenting Complaint? No  Method: No Plan  Availability of Means: No access or NA  Intent: Vague intent or NA  Notification Required: No need or identified person  Additional Information for Danger to Others Potential: -- (n/a)  Additional Comments for Danger to Others Potential: n/a  Are There Guns or Other Weapons in Your Home? No  Types of Guns/Weapons: n/a  Are These Weapons Safely Secured?                            -- (n/a)  Who Could Verify You Are Able To Have These Secured: n/a  Do You Have any Outstanding Charges, Pending Court Dates, Parole/Probation? n/a  Contacted To Inform of Risk of Harm To Self or Others: Other: Comment   Does Patient Present under Involuntary Commitment? No   [place] of Residence: [place]   Patient Currently Receiving  the Following Services: Not Receiving Services   Determination of Need: Emergent (2 hours)   Options For Referral: ED Visit   Disposition Recommendation per psychiatric provider: Detox  Roy Captain MS, LCAS, [facility], [facility] Therapeutic Triage Specialist 06/03/2024 2:05 PM

## 2024-06-03 NOTE — ED Notes (Signed)
Voluntary consent signed by pt

## 2024-06-03 NOTE — ED Provider Notes (Signed)
 [facility] Provider Note    Event Date/Time   First MD Initiated Contact with Patient 06/03/24 1233     (approximate)   History   Drug / Alcohol Assessment   HPI Roy [name] is a 58 y.o. male with history of polysubstance abuse presenting today for detox.  Patient states he drinks approximately 30 beers per day as well as daily crack cocaine use.  He is trying to detox from these.  He reached out to [facility] but they are unable to take him until Friday which is 2 days from now.  He states he is stressed about staying at home and worried that he will relapse and potentially overdose.  He denies SI or HI.  No other drug use or other acute complaints at this time.     Physical Exam   Triage Vital Signs: ED Triage Vitals  Encounter Vitals Group     BP 06/03/24 1114 (!) 166/91     Girls Systolic BP Percentile --      Girls Diastolic BP Percentile --      Boys Systolic BP Percentile --      Boys Diastolic BP Percentile --      Pulse Rate 06/03/24 1114 62     Resp 06/03/24 1114 18     Temp 06/03/24 1114 97.8 F (36.6 C)     Temp Source 06/03/24 1114 Oral     SpO2 06/03/24 1114 98 %     Weight 06/03/24 1113 175 lb (79.4 kg)     Height 06/03/24 1113 5' 9 (1.753 m)     Head Circumference --      Peak Flow --      Pain Score 06/03/24 1114 0     Pain Loc --      Pain Education --      Exclude from Growth Chart --     Most recent vital signs: Vitals:   06/03/24 1114  BP: (!) 166/91  Pulse: 62  Resp: 18  Temp: 97.8 F (36.6 C)  SpO2: 98%   I have reviewed the vital signs. General:  Awake, alert, no acute distress. Head:  Normocephalic, Atraumatic. EENT:  PERRL, EOMI, Oral mucosa pink and moist, Neck is supple. Cardiovascular: Regular rate, 2+ distal pulses. Respiratory:  Normal respiratory effort, symmetrical expansion, no distress.   Extremities:  Moving all four extremities through full ROM without pain.   Neuro:  Alert and  oriented.  Interacting appropriately.   Skin:  Warm, dry, no rash.   Psych: Appropriate affect.    ED Results / Procedures / Treatments   Labs (all labs ordered are listed, but only abnormal results are displayed) Labs Reviewed  COMPREHENSIVE METABOLIC PANEL WITH GFR - Abnormal; Notable for the following components:      Result Value   Glucose, Bld 101 (*)    BUN 23 (*)    Creatinine, Ser 1.40 (*)    GFR, Estimated 58 (*)    All other components within normal limits  URINE DRUG SCREEN, QUALITATIVE ([facility] ONLY) - Abnormal; Notable for the following components:   Cocaine Metabolite,Ur [place] POSITIVE (*)    All other components within normal limits  ETHANOL  CBC     EKG    RADIOLOGY    PROCEDURES:  Critical Care performed: No  Procedures   MEDICATIONS ORDERED IN ED: Medications - No data to display   IMPRESSION / MDM / ASSESSMENT AND PLAN / ED COURSE  I  reviewed the triage vital signs and the nursing notes.                              Differential diagnosis includes, but is not limited to, detox from alcohol and crack cocaine  [name] presentation is most consistent with acute presentation with potential threat to life or bodily function.  Patient is a 58 year old male presenting today for help regarding detox from alcohol and crack cocaine.  Currently he is asymptomatic and not experiencing any withdrawal symptoms but CIWA order set was placed.  No occasion for Ativan  at this time.  Laboratory workup reassuring and no other acute medical complaints at this time.  Will consult TTS to see if we can get him placed in a facility sooner than Friday.  Patient signed out pending evaluation by TTS and potential placement.  The patient has been placed in psychiatric observation due to the need to provide a safe environment for the patient while obtaining psychiatric consultation and evaluation, as well as ongoing medical and medication management to treat the patient's  condition.  The patient has not been placed under full IVC at this time.     FINAL CLINICAL IMPRESSION(S) / ED DIAGNOSES   Final diagnoses:  Polysubstance use disorder     Rx / DC Orders   ED Discharge Orders     None        Note:  This document was prepared using Dragon voice recognition software and may include unintentional dictation errors.   Kandee Orion, MD 06/03/24 [phone]

## 2024-06-03 NOTE — ED Notes (Addendum)
 Patient has been accepted to [facility]) 06/03/24 Patient assigned to room 153 Accepting physician is Dr. Docia Freeman. Call report to [phone]. Representative was [facility] Danika.   ER Staff is aware of it: [facility] ER Secretary Dr. Karlynn Oyster, ER MD Amy, RN [name] Nurse  Address:  [address]                 [place], [place] [address]

## 2024-06-03 NOTE — ED Triage Notes (Signed)
 Patient states I want detox from alcohol and crack/cocaine. Reports he called [facility] and they told patient they would have a bed available on Friday. Patient states last use of alcohol and crack was last night. Denies SI/HI.

## 2024-06-03 NOTE — Group Note (Unsigned)
 Date:  06/03/2024 Time:  8:35 PM  Group Topic/Focus:  Wrap-Up Group:   The focus of this group is to help patients review their daily goal of treatment and discuss progress on daily workbooks.     Participation Level:  {BHH PARTICIPATION MWUXL:24401}  Participation Quality:  {BHH PARTICIPATION QUALITY:22265}  Affect:  {BHH AFFECT:22266}  Cognitive:  {BHH COGNITIVE:22267}  Insight: {BHH Insight2:20797}  Engagement in Group:  {BHH ENGAGEMENT IN UUVOZ:36644}  Modes of Intervention:  {BHH MODES OF INTERVENTION:22269}  Additional Comments:  ***  Roy [name] 06/03/2024, 8:35 PM

## 2024-06-04 ENCOUNTER — Encounter (HOSPITAL_COMMUNITY): Payer: Self-pay | Admitting: Psychiatry

## 2024-06-04 DIAGNOSIS — E785 Hyperlipidemia, unspecified: Secondary | ICD-10-CM | POA: Diagnosis not present

## 2024-06-04 DIAGNOSIS — I10 Essential (primary) hypertension: Secondary | ICD-10-CM | POA: Diagnosis not present

## 2024-06-04 DIAGNOSIS — Z809 Family history of malignant neoplasm, unspecified: Secondary | ICD-10-CM | POA: Diagnosis not present

## 2024-06-04 DIAGNOSIS — Z8249 Family history of ischemic heart disease and other diseases of the circulatory system: Secondary | ICD-10-CM | POA: Diagnosis not present

## 2024-06-04 MED ORDER — CHLORDIAZEPOXIDE HCL 25 MG PO CAPS: 25.0000 mg | ORAL_CAPSULE | Freq: Four times a day (QID) | ORAL | Status: AC | PRN

## 2024-06-04 MED ORDER — ADULT MULTIVITAMIN W/MINERALS CH
1.0000 | ORAL_TABLET | Freq: Every day | ORAL | Status: DC
  Administered 2024-06-04 – 2024-06-08 (×5): 1 via ORAL
  Filled 2024-06-04 (×5): qty 1

## 2024-06-04 MED ORDER — LOPERAMIDE HCL 2 MG PO CAPS: 2.0000 mg | ORAL_CAPSULE | ORAL | Status: DC | PRN

## 2024-06-04 MED ORDER — ENSURE ENLIVE PO LIQD
296.0000 mL | Freq: Three times a day (TID) | ORAL | Status: AC
Start: 2024-06-04 — End: ?
  Administered 2024-06-04 – 2024-06-08 (×11): 296 mL via ORAL
  Filled 2024-06-04 (×15): qty 474

## 2024-06-04 MED ORDER — ONDANSETRON 4 MG PO TBDP
4.0000 mg | ORAL_TABLET | Freq: Three times a day (TID) | ORAL | Status: DC | PRN
  Administered 2024-06-04 – 2024-06-05 (×2): 4 mg via ORAL
  Filled 2024-06-04 (×2): qty 1

## 2024-06-04 MED ORDER — ROSUVASTATIN CALCIUM 20 MG PO TABS
20.0000 mg | ORAL_TABLET | Freq: Every day | ORAL | Status: DC
  Administered 2024-06-04 – 2024-06-08 (×5): 20 mg via ORAL
  Filled 2024-06-04 (×5): qty 1

## 2024-06-04 MED ORDER — THIAMINE HCL 100 MG/ML IJ SOLN
100.0000 mg | Freq: Once | INTRAMUSCULAR | Status: AC
  Administered 2024-06-04: 100 mg via INTRAMUSCULAR
  Filled 2024-06-04: qty 2

## 2024-06-04 MED ORDER — BICTEGRAVIR-EMTRICITAB-TENOFOV 50-200-25 MG PO TABS
1.0000 | ORAL_TABLET | Freq: Every day | ORAL | Status: DC
  Administered 2024-06-04 – 2024-06-08 (×5): 1 via ORAL
  Filled 2024-06-04 (×5): qty 1

## 2024-06-04 MED ORDER — LORATADINE 10 MG PO TABS
10.0000 mg | ORAL_TABLET | Freq: Every day | ORAL | Status: DC
  Administered 2024-06-04: 10 mg via ORAL
  Filled 2024-06-04: qty 1

## 2024-06-04 NOTE — Group Note (Addendum)
 Group Topic: Recovery Basics  Group Date: 06/03/2024 Start Time: 7:30 End Time: 8:00 Facilitators: Lestine Rathke, NT  Department: [facility]  Number of Participants: 5  Group Focus: affirmation Treatment Modality:  Cognitive Behavioral Therapy Interventions utilized were clarification Purpose: enhance coping skills  Name: Roy [name] Date of Birth: Apr 21, 1966  MR: [id number]    Level of Participation: n/a Quality of Participation: n/a  Interactions with others: n/a Mood/Affect: n/a Triggers (if applicable): n/a Cognition: n/a Progress: Other Response: n/a Plan: patient will be encouraged to attend all scheduled activities.  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

## 2024-06-04 NOTE — ED Notes (Signed)
 Pt is in the dayroom watching TV with peers. Pt denies SI/HI/AVH. Pt has no further complain.No acute distress noted.

## 2024-06-04 NOTE — ED Notes (Signed)
 Patient is sleeping. Respirations equal and unlabored. No change in assessment or acuity. Routine safety checks conducted according to facility protocol.

## 2024-06-04 NOTE — ED Provider Notes (Signed)
 Facility Based Crisis Admission H&P  Date: 06/04/24 Patient Name: Roy [name] MRN: [id number] Chief Complaint:  I want to detox   Diagnoses:  Final diagnoses:  HIV disease (HCC)  Hypertension, unspecified type  Dyslipidemia  Alcohol use disorder, severe, dependence (HCC)  Cocaine abuse with cocaine-induced mood disorder (HCC)  Tobacco use    HPI: Roy [name] is a 58 y.o. male with a past psychiatric history of polysubstance abuse, alcohol abuse, cocaine abuse, tobacco use  who presented to [facility] for detox.  He was [facility] the [facility] crisis [facility] on [facility]18 for detox prior to placement at treatment facility.  Patient was interviewed in his room, by this Clinical research associate. Patient reports a chronic history of polysubstance abuse with alcohol, crack cocaine and tobacco.  Patient reports that he drinks approximately 30 beers daily and has been ongoing the last several years. Initial use started at 58 years old. He also reports smoking $100-$150 worth of crack cocaine daily as well, initial onset started at 33 years and has gradually worsened.  Patient reports a headache last night and high blood pressure attributed to withdrawal.  Patient states that he does substances to get away from life's heart aches and pains.  Patient reports some little interest, fatigue, trouble concentrating, poor appetite, feeling hopeless, issues with sleep and feeling bad in the setting of his substance use.  He provides insight that after the substance uses, those things still remain.  Patient also reports that he has lost roughly 50 pounds, with increased crack cocaine use.  He is motivated to detox and discontinue drug use.  He denies any suicidal ideations, homicidal ideations or auditory visual hallucinations.  Patient does not appear to be experiencing a current manic or hypomanic episode.  He is appropriate and does not appear to be responding to internal  stimuli.  Patient reports longest period of sobriety was for 11 months ~2022. 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   Patient reports a prior psychiatric history of MDD and bipolar depression.  He was diagnosed in the setting of finding out about his HIV diagnosis.  Patient reports that he previously discontinued his mirtazapine  and Risperdal  all roughly a year ago due to lack of need for symptomatic control.   Patient is interested in detox and attempted to find availability that are TSH in the [facility] prior to presenting.  Patient is open to discussing 14 and 28-day programs with social work.    PHQ 2-9:  Flowsheet Row ED from 06/03/2024 in [facility]  Thoughts that you would be better off dead, or of hurting yourself in some way Not at all  PHQ-9 Total Score 16    Flowsheet Row ED from 06/03/2024 in [facility] Most recent reading at 06/03/2024  5:42 PM ED from 06/03/2024 in [facility] Emergency Department at [facility] Most recent reading at 06/03/2024  1:56 PM ED to Hosp-Admission (Discharged) from 05/03/2024 in [facility] 1C MEDICAL TELEMETRY Most recent reading at 05/03/2024  9:58 AM  C-SSRS RISK CATEGORY No Risk No Risk No Risk     Total Time spent with patient: 45 minutes  Musculoskeletal  Strength & Muscle Tone: within normal limits Gait & Station: normal Patient leans: N/A  Psychiatric Specialty Exam  Presentation General Appearance:  Appropriate for Environment; Casual  Eye Contact: Good  Speech: Normal Rate; Clear and Coherent  Speech Volume: Normal  Handedness: Right   Mood and Affect  Mood: Euthymic  Affect: Congruent; Appropriate   Thought Process  Thought Processes: Coherent; Goal Directed; Linear  Descriptions of  Associations:Intact  Orientation:Full (Time, Place and Person)  Thought Content:Logical  Diagnosis of Schizophrenia or Schizoaffective disorder in past: No data recorded  Hallucinations:Hallucinations: None  Ideas of Reference:None  Suicidal Thoughts:Suicidal Thoughts: No  Homicidal Thoughts:Homicidal Thoughts: No   Sensorium  Memory: Immediate Good  Judgment: Intact  Insight: Fair   Art therapist  Concentration: Good  Attention Span: Good  Recall: Fair  Fund of Knowledge: Fair  Language: Fair   Psychomotor Activity  Psychomotor Activity:Psychomotor Activity: Normal   Assets  Assets: Communication Skills; Desire for Improvement; Housing; Social Support; Resilience   Sleep  Sleep:Sleep: Fair   No data recorded  Physical Exam Constitutional:      Appearance: Normal appearance.   Eyes:     Conjunctiva/sclera: Conjunctivae normal.   Pulmonary:     Effort: Pulmonary effort is normal.   Musculoskeletal:        General: Normal range of motion.   Neurological:     Mental Status: He is alert.   Psychiatric:        Attention and Perception: He does not perceive auditory or visual hallucinations.    Review of Systems  Constitutional:  Negative for chills and fever.  Cardiovascular:  Negative for chest pain.  Gastrointestinal:  Negative for diarrhea, nausea and vomiting.  Musculoskeletal:  Positive for joint pain. Negative for myalgias.  Neurological:  Positive for headaches.  Psychiatric/Behavioral:  Positive for substance abuse. Negative for hallucinations and suicidal ideas. The patient is not nervous/anxious and does not have insomnia.     Blood pressure (!) 157/94, pulse (!) 50, temperature 99.1 F (37.3 C), temperature source Oral, resp. rate 18, SpO2 100%. There is no height or weight on file to calculate BMI.  Past Psychiatric History: Polysubstance abuse, self-reported MDD and bipolar depression.  Not currently following up  with a psychiatrist or therapist.  Has been off mirtazapine  and Risperdal  for 1 year, due to lack of need for symptomatic control.  Patient has previously gone to [facility] [place] [facility] for substance treatment.  Is the patient at risk to self? No  Has the patient been a risk to self in the past 6 months? No .    Has the patient been a risk to self within the distant past? Yes   Is the patient a risk to others? No   Has the patient been a risk to others in the past 6 months? No   Has the patient been a risk to others within the distant past? No   Past Medical History: HIV, hypertension, dyslipidemia, arthritis per patient report Family History: Per EMR mother with coronary artery disease, hyperlipidemia, uterine cancer and hypertension.  Brother with cancer Father with ethanol abuse, son 1 with crack cocaine use, son 2 with crack cocaine, marijuana and ethanol abuse Social History: Patient currently lives in [place] .  He lives alone in an apartment.  He has a significant other for social support.   Last Labs:  Admission on 06/03/2024, Discharged on 06/03/2024  Component Date Value Ref Range Status   Sodium 06/03/2024 138  135 - 145 mmol/L Final   Potassium 06/03/2024 4.1  3.5 - 5.1 mmol/L Final   Chloride 06/03/2024 104  98 - 111 mmol/L Final  CO2 06/03/2024 25  22 - 32 mmol/L Final   Glucose, Bld 06/03/2024 101 (H)  70 - 99 mg/dL Final   Glucose reference range applies only to samples taken after fasting for at least 8 hours.   BUN 06/03/2024 23 (H)  6 - 20 mg/dL Final   Creatinine, Ser 06/03/2024 1.40 (H)  0.61 - 1.24 mg/dL Final   Calcium  06/03/2024 9.0  8.9 - 10.3 mg/dL Final   Total Protein 91/47/8295 7.7  6.5 - 8.1 g/dL Final   Albumin 62/13/0865 4.3  3.5 - 5.0 g/dL Final   AST 78/46/9629 20  15 - 41 U/L Final   ALT 06/03/2024 17  0 - 44 U/L Final   Alkaline Phosphatase 06/03/2024 70  38 - 126 U/L Final   Total Bilirubin 06/03/2024 0.6  0.0 - 1.2 mg/dL Final    GFR, Estimated 06/03/2024 58 (L)  >60 mL/min Final   Comment: (NOTE) Calculated using the CKD-EPI Creatinine Equation (2021)    Anion gap 06/03/2024 9  5 - 15 Final   Performed at [facility], [address] [place], [place] [address]   Alcohol, Ethyl (B) 06/03/2024 <15  <15 mg/dL Final   Comment: (NOTE) For medical purposes only. Performed at [facility], [address] [place], [place] [address]    WBC 06/03/2024 6.4  4.0 - 10.5 K/uL Final   RBC 06/03/2024 4.55  4.22 - 5.81 MIL/uL Final   Hemoglobin 06/03/2024 15.3  13.0 - 17.0 g/dL Final   HCT 10/13/2535 44.2  39.0 - 52.0 % Final   MCV 06/03/2024 97.1  80.0 - 100.0 fL Final   MCH 06/03/2024 33.6  26.0 - 34.0 pg Final   MCHC 06/03/2024 34.6  30.0 - 36.0 g/dL Final   RDW 64/40/3474 12.2  11.5 - 15.5 % Final   Platelets 06/03/2024 195  150 - 400 K/uL Final   nRBC 06/03/2024 0.0  0.0 - 0.2 % Final   Performed at [facility], [address] [place], [place] [address]   Tricyclic, Ur Screen 06/03/2024 NONE DETECTED  NONE DETECTED Final   Amphetamines, Ur Screen 06/03/2024 NONE DETECTED  NONE DETECTED Final   MDMA (Ecstasy)Ur Screen 06/03/2024 NONE DETECTED  NONE DETECTED Final   Cocaine Metabolite,Ur [place] 06/03/2024 POSITIVE (A)  NONE DETECTED Final   Opiate, Ur Screen 06/03/2024 NONE DETECTED  NONE DETECTED Final   Phencyclidine (PCP) Ur S 06/03/2024 NONE DETECTED  NONE DETECTED Final   Cannabinoid 50 Ng, Ur [place] 06/03/2024 NONE DETECTED  NONE DETECTED Final   Barbiturates, Ur Screen 06/03/2024 NONE DETECTED  NONE DETECTED Final   Benzodiazepine, Ur Scrn 06/03/2024 NONE DETECTED  NONE DETECTED Final   Methadone Scn, Ur 06/03/2024 NONE DETECTED  NONE DETECTED Final   Comment: (NOTE) Tricyclics + metabolites, urine    Cutoff 1000 ng/mL Amphetamines + metabolites, urine  Cutoff 1000 ng/mL MDMA (Ecstasy), urine              Cutoff 500 ng/mL Cocaine Metabolite, urine          Cutoff 300 ng/mL Opiate +  metabolites, urine        Cutoff 300 ng/mL Phencyclidine (PCP), urine         Cutoff 25 ng/mL Cannabinoid, urine                 Cutoff 50 ng/mL Barbiturates + metabolites, urine  Cutoff 200 ng/mL Benzodiazepine, urine  Cutoff 200 ng/mL Methadone, urine                   Cutoff 300 ng/mL  The urine drug screen provides only a preliminary, unconfirmed analytical test result and should not be used for non-medical purposes. Clinical consideration and professional judgment should be applied to any positive drug screen result due to possible interfering substances. A more specific alternate chemical method must be used in order to obtain a confirmed analytical result. Gas chromatography / mass spectrometry (GC/MS) is the preferred confirm                          atory method. Performed at [facility], [address] [place], [place] [address]   Admission on 05/03/2024, Discharged on 05/04/2024  Component Date Value Ref Range Status   WBC 05/03/2024 6.1  4.0 - 10.5 K/uL Final   RBC 05/03/2024 4.12 (L)  4.22 - 5.81 MIL/uL Final   Hemoglobin 05/03/2024 13.7  13.0 - 17.0 g/dL Final   HCT 21/30/8657 40.6  39.0 - 52.0 % Final   MCV 05/03/2024 98.5  80.0 - 100.0 fL Final   MCH 05/03/2024 33.3  26.0 - 34.0 pg Final   MCHC 05/03/2024 33.7  30.0 - 36.0 g/dL Final   RDW 84/69/6295 12.6  11.5 - 15.5 % Final   Platelets 05/03/2024 170  150 - 400 K/uL Final   nRBC 05/03/2024 0.0  0.0 - 0.2 % Final   Neutrophils Relative % 05/03/2024 57  % Final   Neutro Abs 05/03/2024 3.5  1.7 - 7.7 K/uL Final   Lymphocytes Relative 05/03/2024 33  % Final   Lymphs Abs 05/03/2024 2.0  0.7 - 4.0 K/uL Final   Monocytes Relative 05/03/2024 8  % Final   Monocytes Absolute 05/03/2024 0.5  0.1 - 1.0 K/uL Final   Eosinophils Relative 05/03/2024 1  % Final   Eosinophils Absolute 05/03/2024 0.1  0.0 - 0.5 K/uL Final   Basophils Relative 05/03/2024 1  % Final   Basophils Absolute 05/03/2024 0.0   0.0 - 0.1 K/uL Final   Immature Granulocytes 05/03/2024 0  % Final   Abs Immature Granulocytes 05/03/2024 0.02  0.00 - 0.07 K/uL Final   Performed at [facility], [address] [place], [place] [address]   Sodium 05/03/2024 138  135 - 145 mmol/L Final   Potassium 05/03/2024 4.4  3.5 - 5.1 mmol/L Final   Chloride 05/03/2024 105  98 - 111 mmol/L Final   CO2 05/03/2024 23  22 - 32 mmol/L Final   Glucose, Bld 05/03/2024 99  70 - 99 mg/dL Final   Glucose reference range applies only to samples taken after fasting for at least 8 hours.   BUN 05/03/2024 33 (H)  6 - 20 mg/dL Final   Creatinine, Ser 05/03/2024 1.98 (H)  0.61 - 1.24 mg/dL Final   Calcium  05/03/2024 8.6 (L)  8.9 - 10.3 mg/dL Final   Total Protein 24/40/1027 7.1  6.5 - 8.1 g/dL Final   Albumin 25/36/6440 3.7  3.5 - 5.0 g/dL Final   AST 34/74/2595 25  15 - 41 U/L Final   ALT 05/03/2024 19  0 - 44 U/L Final   Alkaline Phosphatase 05/03/2024 66  38 - 126 U/L Final   Total Bilirubin 05/03/2024 0.8  0.0 - 1.2 mg/dL Final   GFR, Estimated 05/03/2024 38 (L)  >60 mL/min Final   Comment: (NOTE) Calculated using the CKD-EPI Creatinine  Equation (2021)    Anion gap 05/03/2024 10  5 - 15 Final   Performed at [facility], [address] [place], [place] [address]   Lipase 05/03/2024 23  11 - 51 U/L Final   Performed at [facility], [address] [place], [place] [address]   Troponin I (High Sensitivity) 05/03/2024 25 (H)  <18 ng/L Final   Comment: (NOTE) Elevated high sensitivity troponin I (hsTnI) values and significant  changes across serial measurements may suggest ACS but many other  chronic and acute conditions are known to elevate hsTnI results.  Refer to the Links section for chest pain algorithms and additional  guidance. Performed at [facility], [address] [place], [place] [address]    Alcohol, Ethyl (B) 05/03/2024 <15  <15 mg/dL Final   Comment: Please note change in  reference range. (NOTE) For medical purposes only. Performed at [facility], [address] [place], [place] [address]    SARS Coronavirus 2 by RT PCR 05/03/2024 NEGATIVE  NEGATIVE Final   Comment: (NOTE) SARS-CoV-2 target nucleic acids are NOT DETECTED.  The SARS-CoV-2 RNA is generally detectable in upper respiratory specimens during the acute phase of infection. The lowest concentration of SARS-CoV-2 viral copies this assay can detect is 138 copies/mL. A negative result does not preclude SARS-Cov-2 infection and should not be used as the sole basis for treatment or other patient management decisions. A negative result may occur with  improper specimen collection/handling, submission of specimen other than nasopharyngeal swab, presence of viral mutation(s) within the areas targeted by this assay, and inadequate number of viral copies(<138 copies/mL). A negative result must be combined with clinical observations, patient history, and epidemiological information. The expected result is Negative.  Fact Sheet for Patients:  BloggerCourse.com  Fact Sheet for Healthcare Providers:  SeriousBroker.it  This test is no                          t yet approved or cleared by the [place]  FDA and  has been authorized for detection and/or diagnosis of SARS-CoV-2 by FDA under an Emergency Use Authorization (EUA). This EUA will remain  in effect (meaning this test can be used) for the duration of the COVID-19 declaration under Section 564(b)(1) of the Act, 21 U.S.C.section 360bbb-3(b)(1), unless the authorization is terminated  or revoked sooner.       Influenza A by PCR 05/03/2024 NEGATIVE  NEGATIVE Final   Influenza B by PCR 05/03/2024 NEGATIVE  NEGATIVE Final   Comment: (NOTE) The Xpert Xpress SARS-CoV-2/FLU/RSV plus assay is intended as an aid in the diagnosis of influenza from Nasopharyngeal swab specimens and should  not be used as a sole basis for treatment. Nasal washings and aspirates are unacceptable for Xpert Xpress SARS-CoV-2/FLU/RSV testing.  Fact Sheet for Patients: BloggerCourse.com  Fact Sheet for Healthcare Providers: SeriousBroker.it  This test is not yet approved or cleared by the [place]  FDA and has been authorized for detection and/or diagnosis of SARS-CoV-2 by FDA under an Emergency Use Authorization (EUA). This EUA will remain in effect (meaning this test can be used) for the duration of the COVID-19 declaration under Section 564(b)(1) of the Act, 21 U.S.C. section 360bbb-3(b)(1), unless the authorization is terminated or revoked.     Resp Syncytial Virus by PCR 05/03/2024 NEGATIVE  NEGATIVE Final   Comment: (NOTE) Fact Sheet for Patients: BloggerCourse.com  Fact Sheet for Healthcare Providers: SeriousBroker.it  This test is  not yet approved or cleared by the [place]  FDA and has been authorized for detection and/or diagnosis of SARS-CoV-2 by FDA under an Emergency Use Authorization (EUA). This EUA will remain in effect (meaning this test can be used) for the duration of the COVID-19 declaration under Section 564(b)(1) of the Act, 21 U.S.C. section 360bbb-3(b)(1), unless the authorization is terminated or revoked.  Performed at [facility], [address] [place], [place] [address]    Troponin I (High Sensitivity) 05/03/2024 22 (H)  <18 ng/L Final   Comment: (NOTE) Elevated high sensitivity troponin I (hsTnI) values and significant  changes across serial measurements may suggest ACS but many other  chronic and acute conditions are known to elevate hsTnI results.  Refer to the Links section for chest pain algorithms and additional  guidance. Performed at [facility], [address] [place], [place] [address]    Color, Urine  05/03/2024 YELLOW (A)  YELLOW Final   APPearance 05/03/2024 HAZY (A)  CLEAR Final   Specific Gravity, Urine 05/03/2024 1.044 (H)  1.005 - 1.030 Final   pH 05/03/2024 5.0  5.0 - 8.0 Final   Glucose, UA 05/03/2024 NEGATIVE  NEGATIVE mg/dL Final   Hgb urine dipstick 05/03/2024 NEGATIVE  NEGATIVE Final   Bilirubin Urine 05/03/2024 NEGATIVE  NEGATIVE Final   Ketones, ur 05/03/2024 NEGATIVE  NEGATIVE mg/dL Final   Protein, ur 09/81/1914 NEGATIVE  NEGATIVE mg/dL Final   Nitrite 78/29/5621 NEGATIVE  NEGATIVE Final   Leukocytes,Ua 05/03/2024 MODERATE (A)  NEGATIVE Final   RBC / HPF 05/03/2024 >50  0 - 5 RBC/hpf Final   WBC, UA 05/03/2024 0-5  0 - 5 WBC/hpf Final   Bacteria, UA 05/03/2024 RARE (A)  NONE SEEN Final   Squamous Epithelial / HPF 05/03/2024 0-5  0 - 5 /HPF Final   Mucus 05/03/2024 PRESENT   Final   Budding Yeast 05/03/2024 PRESENT   Final   Performed at [facility], [address] [place], [place] [address]   Campylobacter species 05/03/2024 NOT DETECTED  NOT DETECTED Final   Plesimonas shigelloides 05/03/2024 NOT DETECTED  NOT DETECTED Final   Salmonella species 05/03/2024 NOT DETECTED  NOT DETECTED Final   Yersinia enterocolitica 05/03/2024 NOT DETECTED  NOT DETECTED Final   Vibrio species 05/03/2024 NOT DETECTED  NOT DETECTED Final   Vibrio cholerae 05/03/2024 NOT DETECTED  NOT DETECTED Final   Enteroaggregative E coli (EAEC) 05/03/2024 NOT DETECTED  NOT DETECTED Final   Enteropathogenic E coli (EPEC) 05/03/2024 NOT DETECTED  NOT DETECTED Final   Enterotoxigenic E coli (ETEC) 05/03/2024 NOT DETECTED  NOT DETECTED Final   Shiga like toxin producing E coli * 05/03/2024 NOT DETECTED  NOT DETECTED Final   Shigella/Enteroinvasive E coli (EI* 05/03/2024 NOT DETECTED  NOT DETECTED Final   Cryptosporidium 05/03/2024 NOT DETECTED  NOT DETECTED Final   Cyclospora cayetanensis 05/03/2024 NOT DETECTED  NOT DETECTED Final   Entamoeba histolytica 05/03/2024 NOT DETECTED  NOT  DETECTED Final   Giardia lamblia 05/03/2024 NOT DETECTED  NOT DETECTED Final   Adenovirus F40/41 05/03/2024 NOT DETECTED  NOT DETECTED Final   Astrovirus 05/03/2024 NOT DETECTED  NOT DETECTED Final   Norovirus GI/GII 05/03/2024 DETECTED (A)  NOT DETECTED Final   Comment: RESULT CALLED TO, READ BACK BY AND VERIFIED WITH: ANGELA R RN 1145 05/04/24 HNM    Rotavirus A 05/03/2024 NOT DETECTED  NOT DETECTED Final   Sapovirus (I, II, IV, and V) 05/03/2024 NOT DETECTED  NOT DETECTED Final  Performed at [facility], [address] [place], [place] [address]   Sodium 05/04/2024 140  135 - 145 mmol/L Final   Potassium 05/04/2024 4.5  3.5 - 5.1 mmol/L Final   Chloride 05/04/2024 111  98 - 111 mmol/L Final   CO2 05/04/2024 26  22 - 32 mmol/L Final   Glucose, Bld 05/04/2024 83  70 - 99 mg/dL Final   Glucose reference range applies only to samples taken after fasting for at least 8 hours.   BUN 05/04/2024 30 (H)  6 - 20 mg/dL Final   Creatinine, Ser 05/04/2024 1.32 (H)  0.61 - 1.24 mg/dL Final   Calcium  05/04/2024 8.1 (L)  8.9 - 10.3 mg/dL Final   GFR, Estimated 05/04/2024 >60  >60 mL/min Final   Comment: (NOTE) Calculated using the CKD-EPI Creatinine Equation (2021)    Anion gap 05/04/2024 3 (L)  5 - 15 Final   Performed at [facility], [address] [place], [place] [address]   Tricyclic, Ur Screen 05/03/2024 NONE DETECTED  NONE DETECTED Final   Amphetamines, Ur Screen 05/03/2024 NONE DETECTED  NONE DETECTED Final   MDMA (Ecstasy)Ur Screen 05/03/2024 NONE DETECTED  NONE DETECTED Final   Cocaine Metabolite,Ur [place] 05/03/2024 POSITIVE (A)  NONE DETECTED Final   Opiate, Ur Screen 05/03/2024 POSITIVE (A)  NONE DETECTED Final   Phencyclidine (PCP) Ur S 05/03/2024 NONE DETECTED  NONE DETECTED Final   Cannabinoid 50 Ng, Ur [place] 05/03/2024 NONE DETECTED  NONE DETECTED Final   Barbiturates, Ur Screen 05/03/2024 NONE DETECTED  NONE DETECTED Final   Benzodiazepine, Ur Scrn 05/03/2024  POSITIVE (A)  NONE DETECTED Final   Methadone Scn, Ur 05/03/2024 NONE DETECTED  NONE DETECTED Final   Comment: (NOTE) Tricyclics + metabolites, urine    Cutoff 1000 ng/mL Amphetamines + metabolites, urine  Cutoff 1000 ng/mL MDMA (Ecstasy), urine              Cutoff 500 ng/mL Cocaine Metabolite, urine          Cutoff 300 ng/mL Opiate + metabolites, urine        Cutoff 300 ng/mL Phencyclidine (PCP), urine         Cutoff 25 ng/mL Cannabinoid, urine                 Cutoff 50 ng/mL Barbiturates + metabolites, urine  Cutoff 200 ng/mL Benzodiazepine, urine              Cutoff 200 ng/mL Methadone, urine                   Cutoff 300 ng/mL  The urine drug screen provides only a preliminary, unconfirmed analytical test result and should not be used for non-medical purposes. Clinical consideration and professional judgment should be applied to any positive drug screen result due to possible interfering substances. A more specific alternate chemical method must be used in order to obtain a confirmed analytical result. Gas chromatography / mass spectrometry (GC/MS) is the preferred confirm                          atory method. Performed at [facility], [address], [place], [place] [address]   Admission on 04/11/2024, Discharged on 04/11/2024  Component Date Value Ref Range Status   Lipase 04/11/2024 36  11 - 51 U/L Final   Performed at [facility], [address] [place], [place] [address]   Sodium 04/11/2024 136  135 - 145 mmol/L Final   Potassium 04/11/2024 4.2  3.5 - 5.1 mmol/L Final   Chloride 04/11/2024 107  98 - 111 mmol/L Final   CO2 04/11/2024 23  22 - 32 mmol/L Final   Glucose, Bld 04/11/2024 94  70 - 99 mg/dL Final   Glucose reference range applies only to samples taken after fasting for at least 8 hours.   BUN 04/11/2024 27 (H)  6 - 20 mg/dL Final   Creatinine, Ser 04/11/2024 1.47 (H)  0.61 - 1.24 mg/dL Final   Calcium  04/11/2024 8.7 (L)  8.9 - 10.3  mg/dL Final   Total Protein 81/19/1478 6.5  6.5 - 8.1 g/dL Final   Albumin 29/56/2130 3.5  3.5 - 5.0 g/dL Final   AST 86/57/8469 21  15 - 41 U/L Final   ALT 04/11/2024 17  0 - 44 U/L Final   Alkaline Phosphatase 04/11/2024 67  38 - 126 U/L Final   Total Bilirubin 04/11/2024 0.2  0.0 - 1.2 mg/dL Final   GFR, Estimated 04/11/2024 55 (L)  >60 mL/min Final   Comment: (NOTE) Calculated using the CKD-EPI Creatinine Equation (2021)    Anion gap 04/11/2024 6  5 - 15 Final   Performed at [facility], [address] [place], [place] [address]   WBC 04/11/2024 3.4 (L)  4.0 - 10.5 K/uL Final   RBC 04/11/2024 4.02 (L)  4.22 - 5.81 MIL/uL Final   Hemoglobin 04/11/2024 13.7  13.0 - 17.0 g/dL Final   HCT 84/13/2440 40.2  39.0 - 52.0 % Final   MCV 04/11/2024 100.0  80.0 - 100.0 fL Final   MCH 04/11/2024 34.1 (H)  26.0 - 34.0 pg Final   MCHC 04/11/2024 34.1  30.0 - 36.0 g/dL Final   RDW 10/13/2535 12.4  11.5 - 15.5 % Final   Platelets 04/11/2024 216  150 - 400 K/uL Final   nRBC 04/11/2024 0.0  0.0 - 0.2 % Final   Performed at [facility], [address] [place], [place] [address]  Hospital Outpatient Visit on 01/14/2024  Component Date Value Ref Range Status   Hgb A1c MFr Bld 01/14/2024 5.8 (H)  4.8 - 5.6 % Final   Comment: (NOTE) Pre diabetes:          5.7%-6.4%  Diabetes:              >6.4%  Glycemic control for   <7.0% adults with diabetes    Mean Plasma Glucose 01/14/2024 119.76  mg/dL Final   Performed at [facility], [address], [place], [place] [address]   Sodium 01/14/2024 139  135 - 145 mmol/L Final   Potassium 01/14/2024 4.2  3.5 - 5.1 mmol/L Final   Chloride 01/14/2024 108  98 - 111 mmol/L Final   CO2 01/14/2024 21 (L)  22 - 32 mmol/L Final   Glucose, Bld 01/14/2024 99  70 - 99 mg/dL Final   Glucose reference range applies only to samples taken after fasting for at least 8 hours.   BUN 01/14/2024 27 (H)  6 - 20 mg/dL Final   Creatinine, Ser  01/14/2024 1.15  0.61 - 1.24 mg/dL Final   Calcium  01/14/2024 9.0  8.9 - 10.3 mg/dL Final   Total Protein 95/63/8756 6.7  6.5 - 8.1 g/dL Final   Albumin 43/32/9518 3.8  3.5 - 5.0 g/dL Final   AST 84/16/6063 18  15 - 41 U/L Final   ALT 01/14/2024 15  0 - 44 U/L Final  Alkaline Phosphatase 01/14/2024 68  38 - 126 U/L Final   Total Bilirubin 01/14/2024 0.4  0.0 - 1.2 mg/dL Final   GFR, Estimated 01/14/2024 >60  >60 mL/min Final   Comment: (NOTE) Calculated using the CKD-EPI Creatinine Equation (2021)    Anion gap 01/14/2024 10  5 - 15 Final   Performed at [facility], [address] [place], [place] [address]   WBC 01/14/2024 2.9 (L)  4.0 - 10.5 K/uL Final   RBC 01/14/2024 4.26  4.22 - 5.81 MIL/uL Final   Hemoglobin 01/14/2024 14.1  13.0 - 17.0 g/dL Final   HCT 30/86/5784 41.6  39.0 - 52.0 % Final   MCV 01/14/2024 97.7  80.0 - 100.0 fL Final   MCH 01/14/2024 33.1  26.0 - 34.0 pg Final   MCHC 01/14/2024 33.9  30.0 - 36.0 g/dL Final   RDW 69/62/9528 12.0  11.5 - 15.5 % Final   Platelets 01/14/2024 215  150 - 400 K/uL Final   nRBC 01/14/2024 0.0  0.0 - 0.2 % Final   Neutrophils Relative % 01/14/2024 34  % Final   Neutro Abs 01/14/2024 1.0 (L)  1.7 - 7.7 K/uL Final   Lymphocytes Relative 01/14/2024 54  % Final   Lymphs Abs 01/14/2024 1.5  0.7 - 4.0 K/uL Final   Monocytes Relative 01/14/2024 9  % Final   Monocytes Absolute 01/14/2024 0.3  0.1 - 1.0 K/uL Final   Eosinophils Relative 01/14/2024 2  % Final   Eosinophils Absolute 01/14/2024 0.1  0.0 - 0.5 K/uL Final   Basophils Relative 01/14/2024 1  % Final   Basophils Absolute 01/14/2024 0.0  0.0 - 0.1 K/uL Final   Immature Granulocytes 01/14/2024 0  % Final   Abs Immature Granulocytes 01/14/2024 0.01  0.00 - 0.07 K/uL Final   Performed at [facility], [address] [place], [place] [address]   QuantiFERON Incubation 01/14/2024 Incubation performed.   Final   QuantiFERON-TB Gold Plus 01/14/2024 Negative  Negative  Final   Comment: (NOTE) No response to M tuberculosis antigens detected. Infection with M tuberculosis is unlikely, but high risk individuals should be considered for additional testing (ATS/IDSA/CDC Clinical Practice Guidelines, 2017). The reference range is an Antigen minus Nil result of <0.35 IU/mL. Chemiluminescence immunoassay methodology Performed At: [facility] [address] [place], [place] [id number] Pearlean Botts MD [id number]    RPR Ser Ql 01/14/2024 NON REACTIVE  NON REACTIVE Final   Performed at [facility], [address] [place], [place] [address]   Absolute CD 4 Helper 01/14/2024 555  359 - 1,519 /uL Final   % CD 4 Pos. Lymph. 01/14/2024 34.7  30.8 - 58.5 % Final   CD3+CD8+ Cells # Bld 01/14/2024 835  109 - 897 /uL Final   CD3+CD8+ Cells NFr Bld 01/14/2024 52.2 (H)  12.0 - 35.5 % Final   CD3+CD4+ Cells/CD3+CD8+ Cells Bld 01/14/2024 0.66 (L)  0.92 - 3.72 Final   WBC 01/14/2024 2.9 (L)  3.4 - 10.8 x10E3/uL Final   RBC 01/14/2024 4.22  4.14 - 5.80 x10E6/uL Final   Ovalocytes present.   Hemoglobin 01/14/2024 14.4  13.0 - 17.7 g/dL Final   Hematocrit 56/43/3295 42.6  37.5 - 51.0 % Final   MCV 01/14/2024 101 (H)  79 - 97 fL Final   MCH 01/14/2024 34.1 (H)  26.6 - 33.0 pg Final   MCHC 01/14/2024 33.8  31.5 - 35.7 g/dL Final   RDW 18/84/1660 11.9  11.6 - 15.4 %  Final   Platelets 01/14/2024 220  150 - 450 x10E3/uL Final   Neutrophils 01/14/2024 34  Not Estab. % Final   Lymphs 01/14/2024 53  Not Estab. % Final   Monocytes 01/14/2024 9  Not Estab. % Final   Eos 01/14/2024 3  Not Estab. % Final   Basos 01/14/2024 1  Not Estab. % Final   Neutrophils Absolute 01/14/2024 1.0 (L)  1.4 - 7.0 x10E3/uL Final   Lymphocytes Absolute 01/14/2024 1.6  0.7 - 3.1 x10E3/uL Final   Monocytes Absolute 01/14/2024 0.3  0.1 - 0.9 x10E3/uL Final   EOS (ABSOLUTE) 01/14/2024 0.1  0.0 - 0.4 x10E3/uL Final   Basophils Absolute 01/14/2024 0.0  0.0 - 0.2 x10E3/uL Final    Immature Granulocytes 01/14/2024 0  Not Estab. % Final   Immature Grans (Abs) 01/14/2024 0.0  0.0 - 0.1 x10E3/uL Final   Hematology Comments: 01/14/2024 Note:   Corrected   Comment: (NOTE) Verified by microscopic examination. Performed At: [facility] [address] [place], [place] [id number] Pearlean Botts MD YQ:[id number]    HIV 1 RNA Quant 01/14/2024 40  copies/mL Corrected   Comment: (NOTE) The reportable range for this assay is 20 to 10,000,000 copies HIV-1 RNA/mL.    LOG10 HIV-1 RNA 01/14/2024 1.602  log10copy/mL Final   Comment: (NOTE) Performed At: [facility] [address] [place], [place] [id number] Pearlean Botts MD [id number]:[id number]    QuantiFERON Criteria 01/14/2024 Comment   Final   Comment: (NOTE) QuantiFERON-TB Gold Plus is a qualitative indirect test for M tuberculosis infection (including disease) and is intended for use in conjunction with risk assessment, radiography, and other medical and diagnostic evaluations. The QuantiFERON-TB Gold Plus result is determined by subtracting the Nil value from either TB antigen (Ag) value. The Mitogen tube serves as a control for the test.    QuantiFERON TB1 Ag Value 01/14/2024 0.04  IU/mL Final   QuantiFERON TB2 Ag Value 01/14/2024 0.05  IU/mL Final   QuantiFERON Nil Value 01/14/2024 0.06  IU/mL Final   QuantiFERON Mitogen Value 01/14/2024 >10.00  IU/mL Final   Comment: (NOTE) Performed At: [facility] [address] [place], [place] [id number] Pearlean Botts MD [id number]     Allergies: Amlodipine , Lisinopril , Statins, Bee pollen, Lactose, Pollen extract, and Yellow jacket venom  Medications:  Facility Ordered Medications  Medication   acetaminophen  (TYLENOL ) tablet 650 mg   alum & mag hydroxide-simeth (MAALOX/MYLANTA) 200-200-20 MG/5ML suspension 30 mL   magnesium  hydroxide (MILK OF MAGNESIA) suspension 30 mL   haloperidol (HALDOL) tablet 5 mg   And   diphenhydrAMINE  (BENADRYL )  capsule 50 mg   haloperidol lactate (HALDOL) injection 5 mg   And   diphenhydrAMINE  (BENADRYL ) injection 50 mg   haloperidol lactate (HALDOL) injection 10 mg   And   diphenhydrAMINE  (BENADRYL ) injection 50 mg   And   LORazepam  (ATIVAN ) injection 2 mg   hydrOXYzine  (ATARAX ) tablet 25 mg   traZODone  (DESYREL ) tablet 50 mg   losartan  (COZAAR ) tablet 100 mg   multivitamin with minerals tablet 1 tablet   [COMPLETED] thiamine  (VITAMIN B1) injection 100 mg   chlordiazePOXIDE (LIBRIUM) capsule 25 mg   loperamide  (IMODIUM ) capsule 2 mg   ondansetron  (ZOFRAN -ODT) disintegrating tablet 4 mg   rosuvastatin  (CRESTOR ) tablet 20 mg   bictegravir-emtricitabine -tenofovir  AF (BIKTARVY ) 50-200-25 MG per tablet 1 tablet   feeding supplement (ENSURE ENLIVE / ENSURE PLUS) liquid 296 mL   PTA Medications  Medication Sig   albuterol  (VENTOLIN  HFA) 108 (90 Base) MCG/ACT inhaler Inhale  1-2 puffs into the lungs every 4 (four) hours as needed for wheezing or shortness of breath.   mirtazapine  (REMERON ) 7.5 MG tablet Take 1 tablet (7.5 mg total) by mouth at bedtime. (Patient not taking: Reported on 05/03/2024)   risperiDONE  (RISPERDAL ) 1 MG tablet Take 1 tablet (1 mg total) by mouth at bedtime. (Patient not taking: Reported on 05/03/2024)   rosuvastatin  (CRESTOR ) 20 MG tablet Take 20 mg by mouth daily.   BIKTARVY  50-200-25 MG TABS tablet Take 1 tablet by mouth every morning.   losartan  (COZAAR ) 100 MG tablet Take 1 tablet (100 mg total) by mouth every morning. Hold for next 3 days   loperamide  (IMODIUM ) 2 MG capsule Take 1 capsule (2 mg total) by mouth as needed for diarrhea or loose stools. (Patient not taking: Reported on 06/03/2024)   ondansetron  (ZOFRAN -ODT) 4 MG disintegrating tablet Take 1 tablet (4 mg total) by mouth every 8 (eight) hours as needed for nausea or vomiting. (Patient not taking: Reported on 06/03/2024)    Long Term Goals: Improvement in symptoms so as ready for discharge  Short Term Goals:  Patient will verbalize feelings in meetings with treatment team members.  Medical Decision Making  Roy [name] is a 58 y.o. male with a past psychiatric history of polysubstance abuse, alcohol abuse, cocaine abuse, tobacco use  who presented to [facility] for detox.  [facility] for detox prior to placement at treatment facility.  Patient continues to want to detox from polysubstance's. He is experiencing mild withdrawal symptoms at the moment and he will continue to stay on the floor for symptomatic stabilization and medication management as needed.  Will speak with social work about potential treatment facility options versus alternative resources available.    Recommendations  Based on my evaluation the patient does not appear to have an emergency medical condition.  Refer to [facility] for detailed medication list  Polysubstance Abuse  Alcohol Use Disorder  Cocaine Use Disorder  Tobacco Use  - Continue to monitor vitals and withdrawal symptoms  - Start CIWA Assessments with symptomatic triggered treatment  - Start Librium 25 mg every 6 hours prn for CIWA >10\ - Discontinued Nicotine  Patch per patient request, denied any nicotinic cravings  - SW following, to consider goals of treatment and resources  -Thiamine  100 mg daily for nutritional supplementation - Thiamine  100 mg injection  - Multivitamin daily with nutrients  - Ensure TID with meals  -hydroxyzine  25 mg every 6 hours prn for anxiety, CIWA < or = 10 -ondansetron  4 mg ODT every 6 hours prn nausea/vomiting -loperamide  2-4 mg capsule prn diarrhea or loose stools   HIV  -Continue home Biktarvy   Dyslipidemia  -Continue home rosuvastatin   Hypertension  -Continue home losartan   Brayton Calin, MD 06/04/24  11:18 AM

## 2024-06-04 NOTE — Group Note (Signed)
 Group Topic: Communication  Group Date: 06/04/2024 Start Time: 1030 End Time: 1100 Facilitators: Arlan Belling, RN  Department: [facility]  Number of Participants: 7  Group Focus: communication and community group Treatment Modality:  Behavior Modification Therapy Interventions utilized were orientation and patient education Purpose: explore maladaptive thinking, express feelings, express irrational fears, and increase insight  Name: Roy [name] Date of Birth: 03-21-1966  MR: [id number]    Level of Participation: active Quality of Participation: attentive and cooperative Interactions with others: gave feedback Mood/Affect: appropriate Triggers (if applicable):   Cognition: coherent/clear Progress: Gaining insight Response:   Plan: follow-up needed  Patients Problems:  Patient Active Problem List   Diagnosis Date Noted   Polysubstance dependence (HCC) 06/03/2024   Generalized abdominal pain 05/04/2024   Gastroenteritis 05/04/2024   Impaired ambulation 05/03/2024   AKI (acute kidney injury) (HCC) 05/03/2024   Suicidal thoughts 06/16/2023   Cocaine abuse with cocaine-induced mood disorder (HCC) 11/13/2020   Allergic rhinitis 06/21/2020   GERD (gastroesophageal reflux disease) 06/21/2020   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   Arthritis pain, hip 05/18/2020   Status post total hip replacement, 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

## 2024-06-04 NOTE — ED Notes (Signed)
 Pt is in the dayroom watching TV with peers. Pt reports having lactose intolerance and has lower dentures on so difficult in chewing. Writer advised pt choose snacks and food which are soft to prevent any eating challenges. Pt denies SI/HI/AVH. Pt has no further complain.No acute distress noted.

## 2024-06-04 NOTE — Group Note (Signed)
 Group Topic: Recovery Basics  Group Date: 06/04/2024 Start Time: 2000 End Time: 2115 Facilitators: Wendall Halls B  Department: [facility]  Number of Participants: 2  Group Focus: abuse issues, acceptance, activities of daily living skills, chemical dependency education, chemical dependency issues, coping skills, daily focus, and depression Treatment Modality:  Individual Therapy and Solution-Focused Therapy Interventions utilized were patient education, problem solving, story telling, and support Purpose: enhance coping skills, express feelings, increase insight, regain self-worth, reinforce self-care, relapse prevention strategies, and trigger / craving management  Name: Roy [name] Date of Birth: May 14, 1966  MR: [id number]    Level of Participation: PT DID NOT ATTEND GROUP Quality of Participation: cooperative Interactions with others: gave feedback Mood/Affect: appropriate Triggers (if applicable): NA Cognition: coherent/clear Progress: None Response: NA Plan: patient will be encouraged to go to groups.   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

## 2024-06-04 NOTE — Group Note (Signed)
 Group Topic: Change and Accountability  Group Date: 06/04/2024 Start Time: 1130 End Time: 1200 Facilitators: Milan Alfred, NT MHT 2 Department: [facility]  Number of Participants: 3  Group Focus: anger management Treatment Modality:  Dialectical Behavioral Therapy Interventions utilized were problem solving Purpose: increase insight  Name: Roy [name] Date of Birth: August 18, 1966  MR: [id number]    Level of Participation: active Quality of Participation: attentive Interactions with others: gave feedback Mood/Affect: positive Triggers (if applicable): His getting angry. Cognition: coherent/clear Progress: Gaining insight Response: Appropriate  Plan: patient will be encouraged to work on avoiding those who may upset him.  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

## 2024-06-04 NOTE — ED Notes (Signed)
 Patient awake and alert on unit sitting in dayroom drinking coffee waiting on breakfast.  No distress.  Will monitor.

## 2024-06-04 NOTE — Tx Team (Signed)
 LCSW met with patient and resident to assess current mood, affect, physical state, and inquire about needs/goals while here in [facility] and after discharge. Patient reports he presented due to alcohol and cocaine detox. Patient stated he was drinking about 30 beers per day and using 150$ of crack cocaine a day. He stated that he lives in his own apartment with poor elctrical wiring and shorted out his phone and has no TV. He is on disability and does not work. He has good supports of his girlfriend who lives in her own apartment. Patient reported a recent charge of shoplifting when he was broke from using his income for cocaine and was caught taking a loaf of bread, cheese and bologna from [employer]. He has court date on July and had one previously for shoplifting 10 years ago. He does have an attorney and is hoping for a misdemeanor and probation. Patient cannot do a residential treatment program at this time but does want to do [facility] program in [place] where he lives once this charge is cleared. He does have an ACT team and case worker is Mr. Annitta Kindler [phone] where they come to his home and provide services for his mental health and psychiatry.   He is interested in seeking an [facility] program in [place] when stable for discharge. Patient had been to several programs in past through [facility] in [place] and in [place]. 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 He lost his position when he randomly had police at a location and found out he had a bench warrant for something 10 years ago.   Patient does not drive but states his ACT team will help with transportation to an [facility] program. Patient also has a pending surgical procedure planned for repair of his hernia. SW will assist with coordination of goals for care discussed. Patient expressed understanding and appreciation of LCSW assistance. No other needs were reported at this time by  patient.

## 2024-06-05 DIAGNOSIS — E785 Hyperlipidemia, unspecified: Secondary | ICD-10-CM | POA: Diagnosis not present

## 2024-06-05 DIAGNOSIS — Z8249 Family history of ischemic heart disease and other diseases of the circulatory system: Secondary | ICD-10-CM | POA: Diagnosis not present

## 2024-06-05 DIAGNOSIS — Z809 Family history of malignant neoplasm, unspecified: Secondary | ICD-10-CM | POA: Diagnosis not present

## 2024-06-05 DIAGNOSIS — I10 Essential (primary) hypertension: Secondary | ICD-10-CM | POA: Diagnosis not present

## 2024-06-05 LAB — SARS CORONAVIRUS 2 BY RT PCR: SARS Coronavirus 2 by RT PCR: NEGATIVE

## 2024-06-05 MED ORDER — LOSARTAN POTASSIUM 50 MG PO TABS
100.0000 mg | ORAL_TABLET | Freq: Every morning | ORAL | Status: DC
  Administered 2024-06-06 – 2024-06-08 (×3): 100 mg via ORAL
  Filled 2024-06-05 (×3): qty 2

## 2024-06-05 MED ORDER — NALTREXONE HCL 50 MG PO TABS
50.0000 mg | ORAL_TABLET | Freq: Every day | ORAL | Status: DC
  Administered 2024-06-06 – 2024-06-08 (×3): 50 mg via ORAL
  Filled 2024-06-05 (×3): qty 1

## 2024-06-05 MED ORDER — TRAZODONE HCL 100 MG PO TABS
100.0000 mg | ORAL_TABLET | Freq: Every evening | ORAL | Status: DC | PRN
  Administered 2024-06-05 – 2024-06-07 (×3): 100 mg via ORAL
  Filled 2024-06-05 (×3): qty 1

## 2024-06-05 MED ORDER — LORATADINE 10 MG PO TABS
10.0000 mg | ORAL_TABLET | Freq: Every day | ORAL | Status: DC
  Administered 2024-06-05 – 2024-06-07 (×3): 10 mg via ORAL
  Filled 2024-06-05 (×3): qty 1

## 2024-06-05 NOTE — ED Notes (Signed)
 Patient in the Dayroom calm and composed with other patients watching TV. NAD. Stated had to use the bathroom couple of times because he is lactose intolerance and he ate cheese pizza without knowing. Patient doing well atm. Respirations even and unlabored, Will continue to monitor for safety.

## 2024-06-05 NOTE — Group Note (Signed)
 Group Topic: Healthy Self Image and Positive Change  Group Date: 06/05/2024 Start Time: 8119 End Time: 2000 Facilitators: Alvino Joseph, NT  Department: [facility]  Number of Participants: 9  Group Focus: goals/reality orientation Treatment Modality:  Individual Therapy Interventions utilized were group exercise Purpose: enhance coping skills  Name: Roy [name] Date of Birth: Mar 20, 1966  MR: [id number]    Level of Participation: active Quality of Participation: cooperative Interactions with others: gave feedback Mood/Affect: appropriate Triggers (if applicable): n/a Cognition: coherent/clear Progress: Moderate Response: goal-stay sober Plan: follow-up needed  Patients Problems:  Patient Active Problem List   Diagnosis Date Noted   Polysubstance dependence (HCC) 06/03/2024   Generalized abdominal pain 05/04/2024   Gastroenteritis 05/04/2024   Impaired ambulation 05/03/2024   AKI (acute kidney injury) (HCC) 05/03/2024   Suicidal thoughts 06/16/2023   Cocaine abuse with cocaine-induced mood disorder (HCC) 11/13/2020   Allergic rhinitis 06/21/2020   GERD (gastroesophageal reflux disease) 06/21/2020   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   Arthritis pain, hip 05/18/2020   Status post total hip replacement, 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

## 2024-06-05 NOTE — Group Note (Signed)
 Group Topic: Social Support  Group Date: 06/05/2024 Start Time: 1700 End Time: 1710 Facilitators: Aileena Iglesia, [place], RN  Department: [facility]  Number of Participants: 1  Group Focus: check in Treatment Modality:  Individual Therapy Interventions utilized were support Purpose: increase insight, social support  Name: Roy [name] Date of Birth: 02-08-1966  MR: [id number]    Level of Participation: active Quality of Participation: attentive, cooperative Interactions with others: gave feedback Mood/Affect: appropriate Triggers (if applicable): None identified Cognition: coherent/clear and logical Progress: Gaining insight Response: Patient spoke with Clinical research associate regarding how they are feeling today, any concerns, and discharge planning Plan: patient will be encouraged to continue to attend groups  Patients Problems:  Patient Active Problem List   Diagnosis Date Noted   Polysubstance dependence (HCC) 06/03/2024   Generalized abdominal pain 05/04/2024   Gastroenteritis 05/04/2024   Impaired ambulation 05/03/2024   AKI (acute kidney injury) (HCC) 05/03/2024   Suicidal thoughts 06/16/2023   Cocaine abuse with cocaine-induced mood disorder (HCC) 11/13/2020   Allergic rhinitis 06/21/2020   GERD (gastroesophageal reflux disease) 06/21/2020   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   Arthritis pain, hip 05/18/2020   Status post total hip replacement, 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

## 2024-06-05 NOTE — ED Notes (Signed)
 Patient is in the bedroom calm and sleeping. NAD Will monitor for safety.

## 2024-06-05 NOTE — ED Notes (Signed)
 Patient in chaplain led group. Calm and composed. No acute distress noted. No concerns voiced. Safety checks in place per facility policy.

## 2024-06-05 NOTE — Group Note (Signed)
 Group Topic: Positive Affirmations  Group Date: 06/05/2024 Start Time: 1130 End Time: 1200 Facilitators: Esther Hem, NT  Department: [facility]  Number of Participants: 8  Group Focus: self-esteem Treatment Modality:  Psychoeducation Interventions utilized were support Purpose: increase insight and regain self-worth  Name: Roy [name] Date of Birth: 1966/01/21  MR: [id number]    Level of Participation: PT did not participate Quality of Participation: No participation Interactions with others: none Mood/Affect: appropriate Triggers (if applicable): n/a Cognition: coherent/clear Progress: Other Response: PT was preoccupied on phone at time of group Plan: patient will be encouraged to attend group  Patients Problems:  Patient Active Problem List   Diagnosis Date Noted   Polysubstance dependence (HCC) 06/03/2024   Generalized abdominal pain 05/04/2024   Gastroenteritis 05/04/2024   Impaired ambulation 05/03/2024   AKI (acute kidney injury) (HCC) 05/03/2024   Suicidal thoughts 06/16/2023   Cocaine abuse with cocaine-induced mood disorder (HCC) 11/13/2020   Allergic rhinitis 06/21/2020   GERD (gastroesophageal reflux disease) 06/21/2020   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   Arthritis pain, hip 05/18/2020   Status post total hip replacement, 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

## 2024-06-05 NOTE — ED Notes (Signed)
 Patient resting with eyes closed in no apparent acute distress. Respirations even and unlabored. Environment secured. Safety checks in place according to facility policy.

## 2024-06-05 NOTE — ED Notes (Signed)
 Patient sitting in cafeteria, lunch provided. No acute distress noted. No concerns voiced. Informed patient to notify staff with any needs or assistance. Patient verbalized understanding or agreement. Safety checks in place per facility policy.

## 2024-06-05 NOTE — ED Notes (Signed)
 Patient alert & oriented x4. Denies intent to harm self or others when asked. Denies A/VH. Patient reports chronic pain in joints rating 7/10. Patient states he normally takes Advil  or Tylenol  for said pain when at home, denied medication for pain from writer stating it won't do any good. No acute distress noted. Scheduled medications administered with no complications. Patient seen in milieu interacting appropriately with staff and peers, mood fair with a pleasant demeanor, no behavior concerns noted. Support and encouragement provided. Routine safety checks conducted per facility protocol. Encouraged patient to notify staff if any thoughts of harm towards self or others arise. Patient verbalizes understanding and agreement.

## 2024-06-05 NOTE — ED Provider Notes (Signed)
 Behavioral Health Progress Note  Date and Time: 06/05/2024 8:09 AM Name: Roy [name] MRN:  [id number]  Subjective:   Patient interviewed in his room this morning.  Patient reports some mild withdrawal symptoms such as headache, mild tremors, dizziness, spots in vision and high blood pressure this morning.  Encouraged patient to continue to monitor withdrawal symptoms and alert someone if things start to become more severe.  Patient denies depression and anxiety this morning.  Denies suicidal ideations, homicidal ideations or auditory visual hallucinations.  Patient reported intermittent sleep last night and stated he is normally on 100 mg of trazodone  to help.  Patient also reports some nausea, related to dietary options available on the unit.  Patient continues to hope for SA IOP once stable for discharge.  Discussed the option of starting naltrexone to help with alcohol use once discharged.  Patient amenable with trialing.  Diagnosis:  Final diagnoses:  HIV disease (HCC)  Hypertension, unspecified type  Dyslipidemia  Alcohol use disorder, severe, dependence (HCC)  Cocaine abuse with cocaine-induced mood disorder (HCC)  Tobacco use    Total Time spent with patient: 15 minutes  Past Psychiatric History: Polysubstance abuse, self-reported MDD and bipolar depression.  Has been off mirtazapine  and Risperdal  for 1 year, due to lack of need for symptomatic control.  Patient has previously gone to [facility] [place] [facility] for substance treatment.  Patient has an ACT team.   Past Medical History: HIV, hypertension, dyslipidemia, arthritis per patient report Family History: Per EMR mother with coronary artery disease, hyperlipidemia, uterine cancer and hypertension.  Brother with cancer Father with ethanol abuse, son 1 with crack cocaine use, son 2 with crack cocaine, marijuana and ethanol abuse Social History: Patient currently lives in [place] .  He lives alone in an  apartment.  He has a significant other for social support.  Patient currently on disability.  Current Medications:  Current Facility-Administered Medications  Medication Dose Route Frequency Provider Last Rate Last Admin   acetaminophen  (TYLENOL ) tablet 650 mg  650 mg Oral Q6H PRN Bethea, Terrence C, MD       alum & mag hydroxide-simeth (MAALOX/MYLANTA) 200-200-20 MG/5ML suspension 30 mL  30 mL Oral Q4H PRN Bethea, Terrence C, MD       bictegravir-emtricitabine -tenofovir  AF (BIKTARVY ) 50-200-25 MG per tablet 1 tablet  1 tablet Oral Daily Brayton Calin, MD   1 tablet at 06/04/24 0941   chlordiazePOXIDE (LIBRIUM) capsule 25 mg  25 mg Oral Q6H PRN Brayton Calin, MD       haloperidol (HALDOL) tablet 5 mg  5 mg Oral TID PRN Nicklas Barns, MD       And   diphenhydrAMINE  (BENADRYL ) capsule 50 mg  50 mg Oral TID PRN Bethea, Terrence C, MD       haloperidol lactate (HALDOL) injection 5 mg  5 mg Intramuscular TID PRN Nicklas Barns, MD       And   diphenhydrAMINE  (BENADRYL ) injection 50 mg  50 mg Intramuscular TID PRN Bethea, Terrence C, MD       haloperidol lactate (HALDOL) injection 10 mg  10 mg Intramuscular TID PRN Nicklas Barns, MD       And   diphenhydrAMINE  (BENADRYL ) injection 50 mg  50 mg Intramuscular TID PRN Nicklas Barns, MD       And   LORazepam  (ATIVAN ) injection 2 mg  2 mg Intramuscular TID PRN Nicklas Barns, MD       feeding supplement (ENSURE  ENLIVE / ENSURE PLUS) liquid 296 mL  296 mL Oral TID with meals Brayton Calin, MD   296 mL at 06/04/24 2102   hydrOXYzine  (ATARAX ) tablet 25 mg  25 mg Oral TID PRN Nicklas Barns, MD       loperamide  (IMODIUM ) capsule 2 mg  2 mg Oral PRN Cuauhtemoc Huegel, MD       loratadine  (CLARITIN ) tablet 10 mg  10 mg Oral Daily Brayton Calin, MD   10 mg at 06/04/24 1450   losartan  (COZAAR ) tablet 100 mg  100 mg Oral q AM Nicklas Barns, MD   100 mg at 06/04/24 0941   magnesium  hydroxide (MILK OF MAGNESIA) suspension 30  mL  30 mL Oral Daily PRN Bethea, Terrence C, MD       multivitamin with minerals tablet 1 tablet  1 tablet Oral Daily Brayton Calin, MD   1 tablet at 06/04/24 0941   ondansetron  (ZOFRAN -ODT) disintegrating tablet 4 mg  4 mg Oral Q8H PRN Brayton Calin, MD   4 mg at 06/04/24 1319   rosuvastatin  (CRESTOR ) tablet 20 mg  20 mg Oral Daily Frederik Standley, MD   20 mg at 06/04/24 0941   traZODone  (DESYREL ) tablet 50 mg  50 mg Oral QHS PRN Bethea, Terrence C, MD   50 mg at 06/04/24 2101   Current Outpatient Medications  Medication Sig Dispense Refill   albuterol  (VENTOLIN  HFA) 108 (90 Base) MCG/ACT inhaler Inhale 1-2 puffs into the lungs every 4 (four) hours as needed for wheezing or shortness of breath. 18 g 1   BIKTARVY  50-200-25 MG TABS tablet Take 1 tablet by mouth every morning. 30 tablet 6   cetirizine (ZYRTEC) 10 MG tablet Take 1 tablet by mouth daily.     loperamide  (IMODIUM ) 2 MG capsule Take 1 capsule (2 mg total) by mouth as needed for diarrhea or loose stools. (Patient not taking: Reported on 06/03/2024) 30 capsule 0   losartan  (COZAAR ) 100 MG tablet Take 1 tablet (100 mg total) by mouth every morning. Hold for next 3 days     mirtazapine  (REMERON ) 7.5 MG tablet Take 1 tablet (7.5 mg total) by mouth at bedtime. (Patient not taking: Reported on 05/03/2024) 30 tablet 1   ondansetron  (ZOFRAN -ODT) 4 MG disintegrating tablet Take 1 tablet (4 mg total) by mouth every 8 (eight) hours as needed for nausea or vomiting. (Patient not taking: Reported on 06/03/2024) 20 tablet 0   risperiDONE  (RISPERDAL ) 1 MG tablet Take 1 tablet (1 mg total) by mouth at bedtime. (Patient not taking: Reported on 05/03/2024) 30 tablet 1   rosuvastatin  (CRESTOR ) 20 MG tablet Take 20 mg by mouth daily.      Labs  Lab Results:  Admission on 06/03/2024, Discharged on 06/03/2024  Component Date Value Ref Range Status   Sodium 06/03/2024 138  135 - 145 mmol/L Final   Potassium 06/03/2024 4.1  3.5 - 5.1 mmol/L Final    Chloride 06/03/2024 104  98 - 111 mmol/L Final   CO2 06/03/2024 25  22 - 32 mmol/L Final   Glucose, Bld 06/03/2024 101 (H)  70 - 99 mg/dL Final   Glucose reference range applies only to samples taken after fasting for at least 8 hours.   BUN 06/03/2024 23 (H)  6 - 20 mg/dL Final   Creatinine, Ser 06/03/2024 1.40 (H)  0.61 - 1.24 mg/dL Final   Calcium  06/03/2024 9.0  8.9 - 10.3 mg/dL Final   Total Protein 47/42/5956 7.7  6.5 - 8.1  g/dL Final   Albumin 16/09/9603 4.3  3.5 - 5.0 g/dL Final   AST 54/08/8118 20  15 - 41 U/L Final   ALT 06/03/2024 17  0 - 44 U/L Final   Alkaline Phosphatase 06/03/2024 70  38 - 126 U/L Final   Total Bilirubin 06/03/2024 0.6  0.0 - 1.2 mg/dL Final   GFR, Estimated 06/03/2024 58 (L)  >60 mL/min Final   Comment: (NOTE) Calculated using the CKD-EPI Creatinine Equation (2021)    Anion gap 06/03/2024 9  5 - 15 Final   Performed at [facility], [address] [place], [place] [address]   Alcohol, Ethyl (B) 06/03/2024 <15  <15 mg/dL Final   Comment: (NOTE) For medical purposes only. Performed at [facility], [address] [place], [place] [address]    WBC 06/03/2024 6.4  4.0 - 10.5 K/uL Final   RBC 06/03/2024 4.55  4.22 - 5.81 MIL/uL Final   Hemoglobin 06/03/2024 15.3  13.0 - 17.0 g/dL Final   HCT 30/86/5784 44.2  39.0 - 52.0 % Final   MCV 06/03/2024 97.1  80.0 - 100.0 fL Final   MCH 06/03/2024 33.6  26.0 - 34.0 pg Final   MCHC 06/03/2024 34.6  30.0 - 36.0 g/dL Final   RDW 69/62/9528 12.2  11.5 - 15.5 % Final   Platelets 06/03/2024 195  150 - 400 K/uL Final   nRBC 06/03/2024 0.0  0.0 - 0.2 % Final   Performed at [facility], [address] [place], [place] [address]   Tricyclic, Ur Screen 06/03/2024 NONE DETECTED  NONE DETECTED Final   Amphetamines, Ur Screen 06/03/2024 NONE DETECTED  NONE DETECTED Final   MDMA (Ecstasy)Ur Screen 06/03/2024 NONE DETECTED  NONE DETECTED Final   Cocaine Metabolite,Ur [place] 06/03/2024 POSITIVE  (A)  NONE DETECTED Final   Opiate, Ur Screen 06/03/2024 NONE DETECTED  NONE DETECTED Final   Phencyclidine (PCP) Ur S 06/03/2024 NONE DETECTED  NONE DETECTED Final   Cannabinoid 50 Ng, Ur [place] 06/03/2024 NONE DETECTED  NONE DETECTED Final   Barbiturates, Ur Screen 06/03/2024 NONE DETECTED  NONE DETECTED Final   Benzodiazepine, Ur Scrn 06/03/2024 NONE DETECTED  NONE DETECTED Final   Methadone Scn, Ur 06/03/2024 NONE DETECTED  NONE DETECTED Final   Comment: (NOTE) Tricyclics + metabolites, urine    Cutoff 1000 ng/mL Amphetamines + metabolites, urine  Cutoff 1000 ng/mL MDMA (Ecstasy), urine              Cutoff 500 ng/mL Cocaine Metabolite, urine          Cutoff 300 ng/mL Opiate + metabolites, urine        Cutoff 300 ng/mL Phencyclidine (PCP), urine         Cutoff 25 ng/mL Cannabinoid, urine                 Cutoff 50 ng/mL Barbiturates + metabolites, urine  Cutoff 200 ng/mL Benzodiazepine, urine              Cutoff 200 ng/mL Methadone, urine                   Cutoff 300 ng/mL  The urine drug screen provides only a preliminary, unconfirmed analytical test result and should not be used for non-medical purposes. Clinical consideration and professional judgment should be applied to any positive drug screen result due to possible interfering substances. A more specific alternate chemical method must be used in order to obtain a confirmed analytical result. Gas chromatography /  mass spectrometry (GC/MS) is the preferred confirm                          atory method. Performed at [facility], [address] [place], [place] [address]   Admission on 05/03/2024, Discharged on 05/04/2024  Component Date Value Ref Range Status   WBC 05/03/2024 6.1  4.0 - 10.5 K/uL Final   RBC 05/03/2024 4.12 (L)  4.22 - 5.81 MIL/uL Final   Hemoglobin 05/03/2024 13.7  13.0 - 17.0 g/dL Final   HCT 60/45/4098 40.6  39.0 - 52.0 % Final   MCV 05/03/2024 98.5  80.0 - 100.0 fL Final   MCH 05/03/2024 33.3   26.0 - 34.0 pg Final   MCHC 05/03/2024 33.7  30.0 - 36.0 g/dL Final   RDW 11/91/4782 12.6  11.5 - 15.5 % Final   Platelets 05/03/2024 170  150 - 400 K/uL Final   nRBC 05/03/2024 0.0  0.0 - 0.2 % Final   Neutrophils Relative % 05/03/2024 57  % Final   Neutro Abs 05/03/2024 3.5  1.7 - 7.7 K/uL Final   Lymphocytes Relative 05/03/2024 33  % Final   Lymphs Abs 05/03/2024 2.0  0.7 - 4.0 K/uL Final   Monocytes Relative 05/03/2024 8  % Final   Monocytes Absolute 05/03/2024 0.5  0.1 - 1.0 K/uL Final   Eosinophils Relative 05/03/2024 1  % Final   Eosinophils Absolute 05/03/2024 0.1  0.0 - 0.5 K/uL Final   Basophils Relative 05/03/2024 1  % Final   Basophils Absolute 05/03/2024 0.0  0.0 - 0.1 K/uL Final   Immature Granulocytes 05/03/2024 0  % Final   Abs Immature Granulocytes 05/03/2024 0.02  0.00 - 0.07 K/uL Final   Performed at [facility], [address] [place], [place] [address]   Sodium 05/03/2024 138  135 - 145 mmol/L Final   Potassium 05/03/2024 4.4  3.5 - 5.1 mmol/L Final   Chloride 05/03/2024 105  98 - 111 mmol/L Final   CO2 05/03/2024 23  22 - 32 mmol/L Final   Glucose, Bld 05/03/2024 99  70 - 99 mg/dL Final   Glucose reference range applies only to samples taken after fasting for at least 8 hours.   BUN 05/03/2024 33 (H)  6 - 20 mg/dL Final   Creatinine, Ser 05/03/2024 1.98 (H)  0.61 - 1.24 mg/dL Final   Calcium  05/03/2024 8.6 (L)  8.9 - 10.3 mg/dL Final   Total Protein 30/86/5784 7.1  6.5 - 8.1 g/dL Final   Albumin 69/62/9528 3.7  3.5 - 5.0 g/dL Final   AST 41/32/4401 25  15 - 41 U/L Final   ALT 05/03/2024 19  0 - 44 U/L Final   Alkaline Phosphatase 05/03/2024 66  38 - 126 U/L Final   Total Bilirubin 05/03/2024 0.8  0.0 - 1.2 mg/dL Final   GFR, Estimated 05/03/2024 38 (L)  >60 mL/min Final   Comment: (NOTE) Calculated using the CKD-EPI Creatinine Equation (2021)    Anion gap 05/03/2024 10  5 - 15 Final   Performed at [facility], [address]  [place], [place] [address]   Lipase 05/03/2024 23  11 - 51 U/L Final   Performed at [facility], [address] [place], [place] [address]   Troponin I (High Sensitivity) 05/03/2024 25 (H)  <18 ng/L Final   Comment: (NOTE) Elevated high sensitivity troponin I (hsTnI) values and significant  changes across serial measurements may  suggest ACS but many other  chronic and acute conditions are known to elevate hsTnI results.  Refer to the Links section for chest pain algorithms and additional  guidance. Performed at [facility], [address] [place], [place] [address]    Alcohol, Ethyl (B) 05/03/2024 <15  <15 mg/dL Final   Comment: Please note change in reference range. (NOTE) For medical purposes only. Performed at [facility], [address] [place], [place] [address]    SARS Coronavirus 2 by RT PCR 05/03/2024 NEGATIVE  NEGATIVE Final   Comment: (NOTE) SARS-CoV-2 target nucleic acids are NOT DETECTED.  The SARS-CoV-2 RNA is generally detectable in upper respiratory specimens during the acute phase of infection. The lowest concentration of SARS-CoV-2 viral copies this assay can detect is 138 copies/mL. A negative result does not preclude SARS-Cov-2 infection and should not be used as the sole basis for treatment or other patient management decisions. A negative result may occur with  improper specimen collection/handling, submission of specimen other than nasopharyngeal swab, presence of viral mutation(s) within the areas targeted by this assay, and inadequate number of viral copies(<138 copies/mL). A negative result must be combined with clinical observations, patient history, and epidemiological information. The expected result is Negative.  Fact Sheet for Patients:  BloggerCourse.com  Fact Sheet for Healthcare Providers:  SeriousBroker.it  This test is no                          t yet  approved or cleared by the [place]  FDA and  has been authorized for detection and/or diagnosis of SARS-CoV-2 by FDA under an Emergency Use Authorization (EUA). This EUA will remain  in effect (meaning this test can be used) for the duration of the COVID-19 declaration under Section 564(b)(1) of the Act, 21 U.S.C.section 360bbb-3(b)(1), unless the authorization is terminated  or revoked sooner.       Influenza A by PCR 05/03/2024 NEGATIVE  NEGATIVE Final   Influenza B by PCR 05/03/2024 NEGATIVE  NEGATIVE Final   Comment: (NOTE) The Xpert Xpress SARS-CoV-2/FLU/RSV plus assay is intended as an aid in the diagnosis of influenza from Nasopharyngeal swab specimens and should not be used as a sole basis for treatment. Nasal washings and aspirates are unacceptable for Xpert Xpress SARS-CoV-2/FLU/RSV testing.  Fact Sheet for Patients: BloggerCourse.com  Fact Sheet for Healthcare Providers: SeriousBroker.it  This test is not yet approved or cleared by the [place]  FDA and has been authorized for detection and/or diagnosis of SARS-CoV-2 by FDA under an Emergency Use Authorization (EUA). This EUA will remain in effect (meaning this test can be used) for the duration of the COVID-19 declaration under Section 564(b)(1) of the Act, 21 U.S.C. section 360bbb-3(b)(1), unless the authorization is terminated or revoked.     Resp Syncytial Virus by PCR 05/03/2024 NEGATIVE  NEGATIVE Final   Comment: (NOTE) Fact Sheet for Patients: BloggerCourse.com  Fact Sheet for Healthcare Providers: SeriousBroker.it  This test is not yet approved or cleared by the [place]  FDA and has been authorized for detection and/or diagnosis of SARS-CoV-2 by FDA under an Emergency Use Authorization (EUA). This EUA will remain in effect (meaning this test can be used) for the duration of the COVID-19  declaration under Section 564(b)(1) of the Act, 21 U.S.C. section 360bbb-3(b)(1), unless the authorization is terminated or revoked.  Performed at [facility], [address], [place], [place] [address]    Troponin I (High Sensitivity) 05/03/2024  22 (H)  <18 ng/L Final   Comment: (NOTE) Elevated high sensitivity troponin I (hsTnI) values and significant  changes across serial measurements may suggest ACS but many other  chronic and acute conditions are known to elevate hsTnI results.  Refer to the Links section for chest pain algorithms and additional  guidance. Performed at [facility], [address] [place], [place] [address]    Color, Urine 05/03/2024 YELLOW (A)  YELLOW Final   APPearance 05/03/2024 HAZY (A)  CLEAR Final   Specific Gravity, Urine 05/03/2024 1.044 (H)  1.005 - 1.030 Final   pH 05/03/2024 5.0  5.0 - 8.0 Final   Glucose, UA 05/03/2024 NEGATIVE  NEGATIVE mg/dL Final   Hgb urine dipstick 05/03/2024 NEGATIVE  NEGATIVE Final   Bilirubin Urine 05/03/2024 NEGATIVE  NEGATIVE Final   Ketones, ur 05/03/2024 NEGATIVE  NEGATIVE mg/dL Final   Protein, ur 60/45/4098 NEGATIVE  NEGATIVE mg/dL Final   Nitrite 11/91/4782 NEGATIVE  NEGATIVE Final   Leukocytes,Ua 05/03/2024 MODERATE (A)  NEGATIVE Final   RBC / HPF 05/03/2024 >50  0 - 5 RBC/hpf Final   WBC, UA 05/03/2024 0-5  0 - 5 WBC/hpf Final   Bacteria, UA 05/03/2024 RARE (A)  NONE SEEN Final   Squamous Epithelial / HPF 05/03/2024 0-5  0 - 5 /HPF Final   Mucus 05/03/2024 PRESENT   Final   Budding Yeast 05/03/2024 PRESENT   Final   Performed at [facility], [address] [place], [place] [address]   Campylobacter species 05/03/2024 NOT DETECTED  NOT DETECTED Final   Plesimonas shigelloides 05/03/2024 NOT DETECTED  NOT DETECTED Final   Salmonella species 05/03/2024 NOT DETECTED  NOT DETECTED Final   Yersinia enterocolitica 05/03/2024 NOT DETECTED  NOT DETECTED Final   Vibrio species  05/03/2024 NOT DETECTED  NOT DETECTED Final   Vibrio cholerae 05/03/2024 NOT DETECTED  NOT DETECTED Final   Enteroaggregative E coli (EAEC) 05/03/2024 NOT DETECTED  NOT DETECTED Final   Enteropathogenic E coli (EPEC) 05/03/2024 NOT DETECTED  NOT DETECTED Final   Enterotoxigenic E coli (ETEC) 05/03/2024 NOT DETECTED  NOT DETECTED Final   Shiga like toxin producing E coli * 05/03/2024 NOT DETECTED  NOT DETECTED Final   Shigella/Enteroinvasive E coli (EI* 05/03/2024 NOT DETECTED  NOT DETECTED Final   Cryptosporidium 05/03/2024 NOT DETECTED  NOT DETECTED Final   Cyclospora cayetanensis 05/03/2024 NOT DETECTED  NOT DETECTED Final   Entamoeba histolytica 05/03/2024 NOT DETECTED  NOT DETECTED Final   Giardia lamblia 05/03/2024 NOT DETECTED  NOT DETECTED Final   Adenovirus F40/41 05/03/2024 NOT DETECTED  NOT DETECTED Final   Astrovirus 05/03/2024 NOT DETECTED  NOT DETECTED Final   Norovirus GI/GII 05/03/2024 DETECTED (A)  NOT DETECTED Final   Comment: RESULT CALLED TO, READ BACK BY AND VERIFIED WITH: ANGELA R RN 1145 05/04/24 HNM    Rotavirus A 05/03/2024 NOT DETECTED  NOT DETECTED Final   Sapovirus (I, II, IV, and V) 05/03/2024 NOT DETECTED  NOT DETECTED Final   Performed at [facility], [address] [place], [place] [address]   Sodium 05/04/2024 140  135 - 145 mmol/L Final   Potassium 05/04/2024 4.5  3.5 - 5.1 mmol/L Final   Chloride 05/04/2024 111  98 - 111 mmol/L Final   CO2 05/04/2024 26  22 - 32 mmol/L Final   Glucose, Bld 05/04/2024 83  70 - 99 mg/dL Final   Glucose reference range applies only to samples taken after fasting for at least 8 hours.  BUN 05/04/2024 30 (H)  6 - 20 mg/dL Final   Creatinine, Ser 05/04/2024 1.32 (H)  0.61 - 1.24 mg/dL Final   Calcium  05/04/2024 8.1 (L)  8.9 - 10.3 mg/dL Final   GFR, Estimated 05/04/2024 >60  >60 mL/min Final   Comment: (NOTE) Calculated using the CKD-EPI Creatinine Equation (2021)    Anion gap 05/04/2024 3 (L)  5 - 15 Final    Performed at [facility], [address] [place], [place] [address]   Tricyclic, Ur Screen 05/03/2024 NONE DETECTED  NONE DETECTED Final   Amphetamines, Ur Screen 05/03/2024 NONE DETECTED  NONE DETECTED Final   MDMA (Ecstasy)Ur Screen 05/03/2024 NONE DETECTED  NONE DETECTED Final   Cocaine Metabolite,Ur [place] 05/03/2024 POSITIVE (A)  NONE DETECTED Final   Opiate, Ur Screen 05/03/2024 POSITIVE (A)  NONE DETECTED Final   Phencyclidine (PCP) Ur S 05/03/2024 NONE DETECTED  NONE DETECTED Final   Cannabinoid 50 Ng, Ur [place] 05/03/2024 NONE DETECTED  NONE DETECTED Final   Barbiturates, Ur Screen 05/03/2024 NONE DETECTED  NONE DETECTED Final   Benzodiazepine, Ur Scrn 05/03/2024 POSITIVE (A)  NONE DETECTED Final   Methadone Scn, Ur 05/03/2024 NONE DETECTED  NONE DETECTED Final   Comment: (NOTE) Tricyclics + metabolites, urine    Cutoff 1000 ng/mL Amphetamines + metabolites, urine  Cutoff 1000 ng/mL MDMA (Ecstasy), urine              Cutoff 500 ng/mL Cocaine Metabolite, urine          Cutoff 300 ng/mL Opiate + metabolites, urine        Cutoff 300 ng/mL Phencyclidine (PCP), urine         Cutoff 25 ng/mL Cannabinoid, urine                 Cutoff 50 ng/mL Barbiturates + metabolites, urine  Cutoff 200 ng/mL Benzodiazepine, urine              Cutoff 200 ng/mL Methadone, urine                   Cutoff 300 ng/mL  The urine drug screen provides only a preliminary, unconfirmed analytical test result and should not be used for non-medical purposes. Clinical consideration and professional judgment should be applied to any positive drug screen result due to possible interfering substances. A more specific alternate chemical method must be used in order to obtain a confirmed analytical result. Gas chromatography / mass spectrometry (GC/MS) is the preferred confirm                          atory method. Performed at [facility], [address] [place], [place] [address]   Admission on  04/11/2024, Discharged on 04/11/2024  Component Date Value Ref Range Status   Lipase 04/11/2024 36  11 - 51 U/L Final   Performed at [facility], [address] [place], [place] [address]   Sodium 04/11/2024 136  135 - 145 mmol/L Final   Potassium 04/11/2024 4.2  3.5 - 5.1 mmol/L Final   Chloride 04/11/2024 107  98 - 111 mmol/L Final   CO2 04/11/2024 23  22 - 32 mmol/L Final   Glucose, Bld 04/11/2024 94  70 - 99 mg/dL Final   Glucose reference range applies only to samples taken after fasting for at least 8 hours.   BUN 04/11/2024 27 (H)  6 - 20 mg/dL Final   Creatinine, Ser 04/11/2024 1.47 (H)  0.61 - 1.24 mg/dL Final   Calcium  04/11/2024 8.7 (L)  8.9 - 10.3 mg/dL Final   Total Protein 32/95/1884 6.5  6.5 - 8.1 g/dL Final   Albumin 16/60/6301 3.5  3.5 - 5.0 g/dL Final   AST 60/09/9322 21  15 - 41 U/L Final   ALT 04/11/2024 17  0 - 44 U/L Final   Alkaline Phosphatase 04/11/2024 67  38 - 126 U/L Final   Total Bilirubin 04/11/2024 0.2  0.0 - 1.2 mg/dL Final   GFR, Estimated 04/11/2024 55 (L)  >60 mL/min Final   Comment: (NOTE) Calculated using the CKD-EPI Creatinine Equation (2021)    Anion gap 04/11/2024 6  5 - 15 Final   Performed at [facility], [address] [place], [place] [address]   WBC 04/11/2024 3.4 (L)  4.0 - 10.5 K/uL Final   RBC 04/11/2024 4.02 (L)  4.22 - 5.81 MIL/uL Final   Hemoglobin 04/11/2024 13.7  13.0 - 17.0 g/dL Final   HCT 20/25/4270 40.2  39.0 - 52.0 % Final   MCV 04/11/2024 100.0  80.0 - 100.0 fL Final   MCH 04/11/2024 34.1 (H)  26.0 - 34.0 pg Final   MCHC 04/11/2024 34.1  30.0 - 36.0 g/dL Final   RDW 62/37/6283 12.4  11.5 - 15.5 % Final   Platelets 04/11/2024 216  150 - 400 K/uL Final   nRBC 04/11/2024 0.0  0.0 - 0.2 % Final   Performed at [facility], [address], [place], [place] [address]  Hospital Outpatient Visit on 01/14/2024  Component Date Value Ref Range Status   Hgb A1c MFr Bld 01/14/2024 5.8 (H)  4.8 - 5.6  % Final   Comment: (NOTE) Pre diabetes:          5.7%-6.4%  Diabetes:              >6.4%  Glycemic control for   <7.0% adults with diabetes    Mean Plasma Glucose 01/14/2024 119.76  mg/dL Final   Performed at [facility], [address], [place], [place] [address]   Sodium 01/14/2024 139  135 - 145 mmol/L Final   Potassium 01/14/2024 4.2  3.5 - 5.1 mmol/L Final   Chloride 01/14/2024 108  98 - 111 mmol/L Final   CO2 01/14/2024 21 (L)  22 - 32 mmol/L Final   Glucose, Bld 01/14/2024 99  70 - 99 mg/dL Final   Glucose reference range applies only to samples taken after fasting for at least 8 hours.   BUN 01/14/2024 27 (H)  6 - 20 mg/dL Final   Creatinine, Ser 01/14/2024 1.15  0.61 - 1.24 mg/dL Final   Calcium  01/14/2024 9.0  8.9 - 10.3 mg/dL Final   Total Protein 71/05/2693 6.7  6.5 - 8.1 g/dL Final   Albumin 85/46/2703 3.8  3.5 - 5.0 g/dL Final   AST 50/08/3817 18  15 - 41 U/L Final   ALT 01/14/2024 15  0 - 44 U/L Final   Alkaline Phosphatase 01/14/2024 68  38 - 126 U/L Final   Total Bilirubin 01/14/2024 0.4  0.0 - 1.2 mg/dL Final   GFR, Estimated 01/14/2024 >60  >60 mL/min Final   Comment: (NOTE) Calculated using the CKD-EPI Creatinine Equation (2021)    Anion gap 01/14/2024 10  5 - 15 Final   Performed at [facility], [address] [place], [place] [address]   WBC 01/14/2024 2.9 (L)  4.0 - 10.5 K/uL Final   RBC 01/14/2024 4.26  4.22 - 5.81 MIL/uL Final   Hemoglobin 01/14/2024 14.1  13.0 - 17.0 g/dL Final   HCT 14/78/2956 41.6  39.0 - 52.0 % Final   MCV 01/14/2024 97.7  80.0 - 100.0 fL Final   MCH 01/14/2024 33.1  26.0 - 34.0 pg Final   MCHC 01/14/2024 33.9  30.0 - 36.0 g/dL Final   RDW 21/30/8657 12.0  11.5 - 15.5 % Final   Platelets 01/14/2024 215  150 - 400 K/uL Final   nRBC 01/14/2024 0.0  0.0 - 0.2 % Final   Neutrophils Relative % 01/14/2024 34  % Final   Neutro Abs 01/14/2024 1.0 (L)  1.7 - 7.7 K/uL Final   Lymphocytes Relative 01/14/2024 54  % Final    Lymphs Abs 01/14/2024 1.5  0.7 - 4.0 K/uL Final   Monocytes Relative 01/14/2024 9  % Final   Monocytes Absolute 01/14/2024 0.3  0.1 - 1.0 K/uL Final   Eosinophils Relative 01/14/2024 2  % Final   Eosinophils Absolute 01/14/2024 0.1  0.0 - 0.5 K/uL Final   Basophils Relative 01/14/2024 1  % Final   Basophils Absolute 01/14/2024 0.0  0.0 - 0.1 K/uL Final   Immature Granulocytes 01/14/2024 0  % Final   Abs Immature Granulocytes 01/14/2024 0.01  0.00 - 0.07 K/uL Final   Performed at [facility], [address] [place], [place] [address]   QuantiFERON Incubation 01/14/2024 Incubation performed.   Final   QuantiFERON-TB Gold Plus 01/14/2024 Negative  Negative Final   Comment: (NOTE) No response to M tuberculosis antigens detected. Infection with M tuberculosis is unlikely, but high risk individuals should be considered for additional testing (ATS/IDSA/CDC Clinical Practice Guidelines, 2017). The reference range is an Antigen minus Nil result of <0.35 IU/mL. Chemiluminescence immunoassay methodology Performed At: [facility] [address] [place], [place] [id number] Pearlean Botts MD GM:[id number]    RPR Ser Ql 01/14/2024 NON REACTIVE  NON REACTIVE Final   Performed at [facility], [address], [place], [place] [address]   Absolute CD 4 Helper 01/14/2024 555  359 - 1,519 /uL Final   % CD 4 Pos. Lymph. 01/14/2024 34.7  30.8 - 58.5 % Final   CD3+CD8+ Cells # Bld 01/14/2024 835  109 - 897 /uL Final   CD3+CD8+ Cells NFr Bld 01/14/2024 52.2 (H)  12.0 - 35.5 % Final   CD3+CD4+ Cells/CD3+CD8+ Cells Bld 01/14/2024 0.66 (L)  0.92 - 3.72 Final   WBC 01/14/2024 2.9 (L)  3.4 - 10.8 x10E3/uL Final   RBC 01/14/2024 4.22  4.14 - 5.80 x10E6/uL Final   Ovalocytes present.   Hemoglobin 01/14/2024 14.4  13.0 - 17.7 g/dL Final   Hematocrit 74/25/9563 42.6  37.5 - 51.0 % Final   MCV 01/14/2024 101 (H)  79 - 97 fL Final   MCH 01/14/2024 34.1 (H)  26.6 - 33.0 pg Final   MCHC  01/14/2024 33.8  31.5 - 35.7 g/dL Final   RDW 87/56/4332 11.9  11.6 - 15.4 % Final   Platelets 01/14/2024 220  150 - 450 x10E3/uL Final   Neutrophils 01/14/2024 34  Not Estab. % Final   Lymphs 01/14/2024 53  Not Estab. % Final   Monocytes 01/14/2024 9  Not Estab. % Final   Eos 01/14/2024 3  Not Estab. % Final   Basos 01/14/2024 1  Not Estab. % Final   Neutrophils Absolute 01/14/2024 1.0 (L)  1.4 - 7.0 x10E3/uL Final   Lymphocytes Absolute 01/14/2024 1.6  0.7 - 3.1 x10E3/uL  Final   Monocytes Absolute 01/14/2024 0.3  0.1 - 0.9 x10E3/uL Final   EOS (ABSOLUTE) 01/14/2024 0.1  0.0 - 0.4 x10E3/uL Final   Basophils Absolute 01/14/2024 0.0  0.0 - 0.2 x10E3/uL Final   Immature Granulocytes 01/14/2024 0  Not Estab. % Final   Immature Grans (Abs) 01/14/2024 0.0  0.0 - 0.1 x10E3/uL Final   Hematology Comments: 01/14/2024 Note:   Corrected   Comment: (NOTE) Verified by microscopic examination. Performed At: [facility] [address] [place], [place] [id number] Pearlean Botts MD [id number]:[id number]    HIV 1 RNA Quant 01/14/2024 40  copies/mL Corrected   Comment: (NOTE) The reportable range for this assay is 20 to 10,000,000 copies HIV-1 RNA/mL.    LOG10 HIV-1 RNA 01/14/2024 1.602  log10copy/mL Final   Comment: (NOTE) Performed At: [facility] [address] [place], [place] [id number] Pearlean Botts MD EX:[id number]    QuantiFERON Criteria 01/14/2024 Comment   Final   Comment: (NOTE) QuantiFERON-TB Gold Plus is a qualitative indirect test for M tuberculosis infection (including disease) and is intended for use in conjunction with risk assessment, radiography, and other medical and diagnostic evaluations. The QuantiFERON-TB Gold Plus result is determined by subtracting the Nil value from either TB antigen (Ag) value. The Mitogen tube serves as a control for the test.    QuantiFERON TB1 Ag Value 01/14/2024 0.04  IU/mL Final   QuantiFERON TB2 Ag Value 01/14/2024 0.05  IU/mL  Final   QuantiFERON Nil Value 01/14/2024 0.06  IU/mL Final   QuantiFERON Mitogen Value 01/14/2024 >10.00  IU/mL Final   Comment: (NOTE) Performed At: [facility] [address] [place], [place] [id number] Pearlean Botts MD [id number]:[id number]     Blood Alcohol level:  Lab Results  Component Value Date   [facility] <15 06/03/2024   ETH <15 05/03/2024    Metabolic Disorder Labs: Lab Results  Component Value Date   HGBA1C 5.8 (H) 01/14/2024   MPG 119.76 01/14/2024   MPG 102.54 05/27/2018   No results found for: PROLACTIN Lab Results  Component Value Date   CHOL 158 01/23/2019   TRIG 63 01/23/2019   HDL 43 01/23/2019   CHOLHDL 3.7 01/23/2019   VLDL 13 01/23/2019   LDLCALC 102 (H) 01/23/2019   LDLCALC 67 05/27/2018    Therapeutic Lab Levels: No results found for: LITHIUM No results found for: VALPROATE No results found for: CBMZ  Physical Findings   AIMS    Flowsheet Row [facility]) [facility] (Discharged) from 02/20/2017 in [facility]PAT[facility]  AIMS Total Score 0 0   AUDIT    Flowsheet Row Admission (Discharged) from 11/01/2020 in [facility] INPATIENT BEHAVIORAL MEDICINE Admission (Discharged) from 10/18/2020 in [facility]PATIENT BE[facility]ICINE Admission (Discharged) from 05/18/2020 in [facility] INPATIENT BEHAVIORAL MEDICINE Admission (Dis[facility]) [facility] Ad[facility] (Discharged) from 02/19/2019 in [facility] INPATIENT BEHAVIORAL MEDICINE  Alcohol Use Disorder Identification Test Final Score (AUDIT) 4 0 14 0 0   PHQ2-9    Flowsheet Row ED from 06/03/2024 in [facility] Office Visit from 01/14/2024 in [facility] Office Visit from 04/02/2023 in [facility] Office Visit from 12/20/2022 in [facility]  PHQ-2 Total Score 2 1 1 1   PHQ-9 Total Score 16 -- -- --    Flowsheet Row ED from 06/03/2024 in [facility] Most recent reading at 06/03/2024  5:42 PM ED from  06/03/2024 in [facility] Emergency Department at [facility] Most recent reading at 06/03/2024  1:56 PM ED to Hosp-Admission (Discharged) from 05/03/2024 in [facility]C MEDICAL TELEMETRY Most recent reading at 05/03/2024  9:58 AM  C-SSRS RISK CATEGORY No Risk No Risk No Risk     Musculoskeletal  Strength & Muscle Tone: within normal limits Gait & Station: normal Patient leans: N/A  Psychiatric Specialty Exam  Presentation  General Appearance:  Appropriate for Environment; Casual  Eye Contact: Good  Speech: Normal Rate; Clear and Coherent  Speech Volume: Normal  Handedness: Right   Mood and Affect  Mood: Euthymic  Affect: Congruent; Appropriate   Thought Process  Thought Processes: Coherent; Goal Directed; Linear  Descriptions of Associations:Intact  Orientation:Full (Time, Place and Person)  Thought Content:Logical  Diagnosis of Schizophrenia or Schizoaffective disorder in past: No data recorded   Hallucinations:Hallucinations: None  Ideas of Reference:None  Suicidal Thoughts:Suicidal Thoughts: No  Homicidal Thoughts:Homicidal Thoughts: No   Sensorium  Memory: Immediate Good  Judgment: Intact  Insight: Fair   Art therapist  Concentration: Good  Attention Span: Good  Recall: Fair  Fund of Knowledge: Fair  Language: Fair   Psychomotor Activity  Psychomotor Activity: Psychomotor Activity: Normal   Assets  Assets: Communication Skills; Desire for Improvement; Housing; Social Support; Resilience   Sleep  Sleep: Sleep: Fair  Estimated Sleeping Duration (Last 24 Hours): 7.50-9.00 hours  No data recorded  Physical Exam  Physical Exam Constitutional:      Appearance: Normal appearance.   Eyes:     Conjunctiva/sclera: Conjunctivae normal.     Musculoskeletal:        General: Normal range of motion.   Neurological:     Mental Status: He is alert and oriented to person, place, and time.   Psychiatric:        Attention and Perception: He does not perceive auditory or visual hallucinations.        Mood and Affect: Mood is not anxious or depressed.        Speech: Speech normal.        Behavior: Behavior normal. Behavior is cooperative.        Thought Content: Thought content is not paranoid or delusional. Thought content does not include homicidal or suicidal ideation. Thought content does not include homicidal or suicidal plan.    Review of Systems  Constitutional:  Negative for chills and fever.  Eyes:  Negative for blurred vision and double vision.       Intermittent black spots with vision   Respiratory:  Negative for cough.   Cardiovascular:  Negative for chest pain.  Gastrointestinal:  Positive for nausea. Negative for vomiting.  Neurological:  Positive for dizziness, tremors and headaches.  Psychiatric/Behavioral:  Negative for depression, hallucinations, substance abuse and suicidal ideas. The patient has insomnia. The patient is not nervous/anxious.    Blood pressure (!) 143/87, pulse (!) 56, temperature 98.3 F (36.8 C), temperature source Oral, resp. rate 18, SpO2 100%. There is no height or weight on file to calculate BMI.   Medical Decision Making  Chase Arnall is a 58 y.o. male with a past psychiatric history of polysubstance abuse, alcohol abuse, cocaine abuse, tobacco use  who presented to [facility] for detox.  [facility] for detox prior to placement at treatment facility.   Patient patient overall experiencing mild withdrawal symptoms, most recent CIWA score 0 and has not required  any Librium.  Patient continuing to detox until stable for discharge with [facility] as preferred option.     Recommendations  Based on my evaluation  the patient does not appear to have an emergency medical condition.   Refer to [facility] for detailed medication list  Polysubstance Abuse  Alcohol Use Disorder  Cocaine Use Disorder  Tobacco Use  - Start Naltrexone 50 mg daily - Continue to monitor vitals and withdrawal symptoms  - Continue CIWA Assessments with symptomatic triggered treatment  - Continue Librium 25 mg every 6 hours prn for CIWA >10\ - Discontinued Nicotine  Patch per patient request, denied any nicotinic cravings  - SW following, patient interested in [facility] once stable for discharge, he plans to go to [facility] -Thiamine  100 mg daily for nutritional supplementation - Multivitamin daily with nutrients  - Ensure TID with meals  - hydroxyzine  25 mg every 6 hours prn for anxiety, CIWA < or = 10 -ondansetron  4 mg ODT every 6 hours prn nausea/vomiting -loperamide  2-4 mg capsule prn diarrhea or loose stools    HIV  -Continue home Biktarvy    Dyslipidemia  -Continue home rosuvastatin    Hypertension  -Decreased Home Losartan  to 50 mg due to concern for bradycardia, can consider resuming home dose prior to discharge   Shay Jhaveri, MD 06/05/2024 8:09 AM

## 2024-06-06 DIAGNOSIS — Z809 Family history of malignant neoplasm, unspecified: Secondary | ICD-10-CM | POA: Diagnosis not present

## 2024-06-06 DIAGNOSIS — Z8249 Family history of ischemic heart disease and other diseases of the circulatory system: Secondary | ICD-10-CM | POA: Diagnosis not present

## 2024-06-06 DIAGNOSIS — I10 Essential (primary) hypertension: Secondary | ICD-10-CM | POA: Diagnosis not present

## 2024-06-06 DIAGNOSIS — E785 Hyperlipidemia, unspecified: Secondary | ICD-10-CM | POA: Diagnosis not present

## 2024-06-06 NOTE — ED Notes (Signed)
Pt awake and alert at this hour. No apparent distress. RR even and unlabored. Monitored for safety.

## 2024-06-06 NOTE — Group Note (Signed)
 Group Topic: Positive Affirmations  Group Date: 06/06/2024 Start Time: 1220 End Time: 1300 Facilitators: Stanly Stabile, RN  Department: [facility]  Number of Participants: 9  Group Focus: chemical dependency education and chemical dependency issues Treatment Modality:  Behavior Modification Therapy Interventions utilized were clarification, exploration, and patient education Purpose: explore maladaptive thinking, express feelings, express irrational fears, improve communication skills, increase insight, regain self-worth, reinforce self-care, and relapse prevention strategies  Name: Roy [name] Date of Birth: 03-Aug-1966  MR: [id number]    Level of Participation: active Quality of Participation: attentive and cooperative Interactions with others: gave feedback Mood/Affect: appropriate Triggers (if applicable):   Cognition: coherent/clear and goal directed Progress: Gaining insight Response:   Plan: follow-up needed  Patients Problems:  Patient Active Problem List   Diagnosis Date Noted   Polysubstance dependence (HCC) 06/03/2024   Generalized abdominal pain 05/04/2024   Gastroenteritis 05/04/2024   Impaired ambulation 05/03/2024   AKI (acute kidney injury) (HCC) 05/03/2024   Suicidal thoughts 06/16/2023   Cocaine abuse with cocaine-induced mood disorder (HCC) 11/13/2020   Allergic rhinitis 06/21/2020   GERD (gastroesophageal reflux disease) 06/21/2020   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   Arthritis pain, hip 05/18/2020   Status post total hip replacement, 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

## 2024-06-06 NOTE — ED Notes (Signed)
 Patient asleep in bed without issue or concern.  No distress.  Will monitor.

## 2024-06-06 NOTE — ED Notes (Signed)
Pt resting at this hour. No apparent distress. RR even and unlabored. Monitored for safety.  

## 2024-06-06 NOTE — ED Provider Notes (Signed)
 Behavioral Health Progress Note  Date and Time: 06/06/2024 12:20 PM Name: Roy [name] MRN:  [id number]  Subjective: Roy [name] is in a good mood this morning. He denies significant withdrawal symptoms. Mood is good. Anxiety no concern. He denies new health issues. He relates that bradycardia is a function of a distant AMI that resulted in hypoactive cardiac muscle (possibly involving pacer cells or conduction). He denies problems except when smoking (cigarettes/MJ) or other drug use (cocaine, fentanyl ). He concurs that he should stop doing those things. He's tolerating naltrexone .  Denies suicidal ideations, homicidal ideations or auditory visual hallucinations. He details his extensive outpatient support (ACTT) who are awaiting his discharge to resume services. He is also open to [facility] after discharge.     Diagnosis:  Final diagnoses:  HIV disease (HCC)  Hypertension, unspecified type  Dyslipidemia  Alcohol use disorder, severe, dependence (HCC)  Cocaine abuse with cocaine-induced mood disorder (HCC)  Tobacco use   Alcohol use disorder   Total Time spent with patient: 15 minutes   Past Psychiatric History: Polysubstance abuse, self-reported MDD and bipolar depression.  Has been off mirtazapine  and risperidone  for 1 year, due to lack of need for symptomatic control.  Patient has previously gone to [facility] [place] [facility] for substance treatment.  Patient has an ACTT.   Past Medical History: HIV, hypertension, dyslipidemia, arthritis, AMI per patient report  Family History: Per EMR mother with coronary artery disease, hyperlipidemia, uterine cancer and hypertension.  Brother with cancer Father with ethanol abuse, son 1 with crack cocaine use, son 2 with crack cocaine, marijuana and ethanol abuse  Social History: Patient currently lives alone (apartment) in [place] .  He has a significant other for social support.  Patient currently on disability.   Current  Medications:  Current Facility-Administered Medications  Medication Dose Route Frequency Provider Last Rate Last Admin   acetaminophen  (TYLENOL ) tablet 650 mg  650 mg Oral Q6H PRN Cole Kandi BROCKS, MD   650 mg at 06/05/24 1617   alum & mag hydroxide-simeth (MAALOX/MYLANTA) 200-200-20 MG/5ML suspension 30 mL  30 mL Oral Q4H PRN Cole Kandi BROCKS, MD       bictegravir-emtricitabine -tenofovir  AF (BIKTARVY ) 50-200-25 MG per tablet 1 tablet  1 tablet Oral Daily Lenard Calin, MD   1 tablet at 06/06/24 1003   chlordiazePOXIDE  (LIBRIUM ) capsule 25 mg  25 mg Oral Q6H PRN Lenard Calin, MD       haloperidol (HALDOL) tablet 5 mg  5 mg Oral TID PRN Cole Kandi BROCKS, MD       And   diphenhydrAMINE  (BENADRYL ) capsule 50 mg  50 mg Oral TID PRN Nelsy Madonna C, MD       haloperidol lactate (HALDOL) injection 5 mg  5 mg Intramuscular TID PRN Cole Kandi BROCKS, MD       And   diphenhydrAMINE  (BENADRYL ) injection 50 mg  50 mg Intramuscular TID PRN Cole Kandi BROCKS, MD       haloperidol lactate (HALDOL) injection 10 mg  10 mg Intramuscular TID PRN Cole Kandi BROCKS, MD       And   diphenhydrAMINE  (BENADRYL ) injection 50 mg  50 mg Intramuscular TID PRN Cole Kandi BROCKS, MD       And   LORazepam  (ATIVAN ) injection 2 mg  2 mg Intramuscular TID PRN Cole Kandi BROCKS, MD       feeding supplement (ENSURE ENLIVE / ENSURE PLUS) liquid 296 mL  296 mL Oral TID with meals Lenard Calin, MD  296 mL at 06/06/24 1004   hydrOXYzine  (ATARAX ) tablet 25 mg  25 mg Oral TID PRN Alyah Boehning C, MD   25 mg at 06/05/24 2107   loperamide  (IMODIUM ) capsule 2 mg  2 mg Oral PRN Lenard Calin, MD       loratadine  (CLARITIN ) tablet 10 mg  10 mg Oral QHS Lenard Calin, MD   10 mg at 06/05/24 2107   losartan  (COZAAR ) tablet 100 mg  100 mg Oral q AM Lenard Calin, MD   100 mg at 06/06/24 1003   magnesium  hydroxide (MILK OF MAGNESIA) suspension 30 mL  30 mL Oral Daily PRN Darbie Biancardi C, MD        multivitamin with minerals tablet 1 tablet  1 tablet Oral Daily Lenard Calin, MD   1 tablet at 06/06/24 1003   naltrexone  (DEPADE) tablet 50 mg  50 mg Oral Daily Ginelle Bays C, MD   50 mg at 06/06/24 1003   ondansetron  (ZOFRAN -ODT) disintegrating tablet 4 mg  4 mg Oral Q8H PRN Lenard Calin, MD   4 mg at 06/05/24 1639   rosuvastatin  (CRESTOR ) tablet 20 mg  20 mg Oral Daily Lenard Calin, MD   20 mg at 06/06/24 1002   traZODone  (DESYREL ) tablet 100 mg  100 mg Oral QHS PRN Lenard Calin, MD   100 mg at 06/05/24 2107   Current Outpatient Medications  Medication Sig Dispense Refill   albuterol  (VENTOLIN  HFA) 108 (90 Base) MCG/ACT inhaler Inhale 1-2 puffs into the lungs every 4 (four) hours as needed for wheezing or shortness of breath. 18 g 1   BIKTARVY  50-200-25 MG TABS tablet Take 1 tablet by mouth every morning. 30 tablet 6   cetirizine (ZYRTEC) 10 MG tablet Take 1 tablet by mouth daily.     loperamide  (IMODIUM ) 2 MG capsule Take 1 capsule (2 mg total) by mouth as needed for diarrhea or loose stools. (Patient not taking: Reported on 06/03/2024) 30 capsule 0   losartan  (COZAAR ) 100 MG tablet Take 1 tablet (100 mg total) by mouth every morning. Hold for next 3 days     mirtazapine  (REMERON ) 7.5 MG tablet Take 1 tablet (7.5 mg total) by mouth at bedtime. (Patient not taking: Reported on 05/03/2024) 30 tablet 1   ondansetron  (ZOFRAN -ODT) 4 MG disintegrating tablet Take 1 tablet (4 mg total) by mouth every 8 (eight) hours as needed for nausea or vomiting. (Patient not taking: Reported on 06/03/2024) 20 tablet 0   risperiDONE  (RISPERDAL ) 1 MG tablet Take 1 tablet (1 mg total) by mouth at bedtime. (Patient not taking: Reported on 05/03/2024) 30 tablet 1   rosuvastatin  (CRESTOR ) 20 MG tablet Take 20 mg by mouth daily.      Labs  Lab Results:  Admission on 06/03/2024  Component Date Value Ref Range Status   SARS Coronavirus 2 by RT PCR 06/05/2024 NEGATIVE  NEGATIVE Final   Performed at [facility], [address], [place], [place] [address]  Admission on 06/03/2024, Discharged on 06/03/2024  Component Date Value Ref Range Status   Sodium 06/03/2024 138  135 - 145 mmol/L Final   Potassium 06/03/2024 4.1  3.5 - 5.1 mmol/L Final   Chloride 06/03/2024 104  98 - 111 mmol/L Final   CO2 06/03/2024 25  22 - 32 mmol/L Final   Glucose, Bld 06/03/2024 101 (H)  70 - 99 mg/dL Final   Glucose reference range applies only to samples taken after fasting for at least 8 hours.  BUN 06/03/2024 23 (H)  6 - 20 mg/dL Final   Creatinine, Ser 06/03/2024 1.40 (H)  0.61 - 1.24 mg/dL Final   Calcium  06/03/2024 9.0  8.9 - 10.3 mg/dL Final   Total Protein 93/81/7974 7.7  6.5 - 8.1 g/dL Final   Albumin 93/81/7974 4.3  3.5 - 5.0 g/dL Final   AST 93/81/7974 20  15 - 41 U/L Final   ALT 06/03/2024 17  0 - 44 U/L Final   Alkaline Phosphatase 06/03/2024 70  38 - 126 U/L Final   Total Bilirubin 06/03/2024 0.6  0.0 - 1.2 mg/dL Final   GFR, Estimated 06/03/2024 58 (L)  >60 mL/min Final   Comment: (NOTE) Calculated using the CKD-EPI Creatinine Equation (2021)    Anion gap 06/03/2024 9  5 - 15 Final   Performed at [facility], [address] [place], [place] [address]   Alcohol, Ethyl (B) 06/03/2024 <15  <15 mg/dL Final   Comment: (NOTE) For medical purposes only. Performed at [facility], [address] [place], [place] [address]    WBC 06/03/2024 6.4  4.0 - 10.5 K/uL Final   RBC 06/03/2024 4.55  4.22 - 5.81 MIL/uL Final   Hemoglobin 06/03/2024 15.3  13.0 - 17.0 g/dL Final   HCT 93/81/7974 44.2  39.0 - 52.0 % Final   MCV 06/03/2024 97.1  80.0 - 100.0 fL Final   MCH 06/03/2024 33.6  26.0 - 34.0 pg Final   MCHC 06/03/2024 34.6  30.0 - 36.0 g/dL Final   RDW 93/81/7974 12.2  11.5 - 15.5 % Final   Platelets 06/03/2024 195  150 - 400 K/uL Final   nRBC 06/03/2024 0.0  0.0 - 0.2 % Final   Performed at [facility], [address] [place], [place] [address]    Tricyclic, Ur Screen 06/03/2024 NONE DETECTED  NONE DETECTED Final   Amphetamines, Ur Screen 06/03/2024 NONE DETECTED  NONE DETECTED Final   MDMA (Ecstasy)Ur Screen 06/03/2024 NONE DETECTED  NONE DETECTED Final   Cocaine Metabolite,Ur [place] 06/03/2024 POSITIVE (A)  NONE DETECTED Final   Opiate, Ur Screen 06/03/2024 NONE DETECTED  NONE DETECTED Final   Phencyclidine (PCP) Ur S 06/03/2024 NONE DETECTED  NONE DETECTED Final   Cannabinoid 50 Ng, Ur [place] 06/03/2024 NONE DETECTED  NONE DETECTED Final   Barbiturates, Ur Screen 06/03/2024 NONE DETECTED  NONE DETECTED Final   Benzodiazepine, Ur Scrn 06/03/2024 NONE DETECTED  NONE DETECTED Final   Methadone Scn, Ur 06/03/2024 NONE DETECTED  NONE DETECTED Final   Comment: (NOTE) Tricyclics + metabolites, urine    Cutoff 1000 ng/mL Amphetamines + metabolites, urine  Cutoff 1000 ng/mL MDMA (Ecstasy), urine              Cutoff 500 ng/mL Cocaine Metabolite, urine          Cutoff 300 ng/mL Opiate + metabolites, urine        Cutoff 300 ng/mL Phencyclidine (PCP), urine         Cutoff 25 ng/mL Cannabinoid, urine                 Cutoff 50 ng/mL Barbiturates + metabolites, urine  Cutoff 200 ng/mL Benzodiazepine, urine              Cutoff 200 ng/mL Methadone, urine                   Cutoff 300 ng/mL  The urine drug screen provides only a preliminary, unconfirmed analytical test result and should  not be used for non-medical purposes. Clinical consideration and professional judgment should be applied to any positive drug screen result due to possible interfering substances. A more specific alternate chemical method must be used in order to obtain a confirmed analytical result. Gas chromatography / mass spectrometry (GC/MS) is the preferred confirm                          atory method. Performed at [facility], [address] [place], [place] [address]   Admission on 05/03/2024, Discharged on 05/04/2024  Component Date Value Ref Range Status   WBC  05/03/2024 6.1  4.0 - 10.5 K/uL Final   RBC 05/03/2024 4.12 (L)  4.22 - 5.81 MIL/uL Final   Hemoglobin 05/03/2024 13.7  13.0 - 17.0 g/dL Final   HCT 94/81/7974 40.6  39.0 - 52.0 % Final   MCV 05/03/2024 98.5  80.0 - 100.0 fL Final   MCH 05/03/2024 33.3  26.0 - 34.0 pg Final   MCHC 05/03/2024 33.7  30.0 - 36.0 g/dL Final   RDW 94/81/7974 12.6  11.5 - 15.5 % Final   Platelets 05/03/2024 170  150 - 400 K/uL Final   nRBC 05/03/2024 0.0  0.0 - 0.2 % Final   Neutrophils Relative % 05/03/2024 57  % Final   Neutro Abs 05/03/2024 3.5  1.7 - 7.7 K/uL Final   Lymphocytes Relative 05/03/2024 33  % Final   Lymphs Abs 05/03/2024 2.0  0.7 - 4.0 K/uL Final   Monocytes Relative 05/03/2024 8  % Final   Monocytes Absolute 05/03/2024 0.5  0.1 - 1.0 K/uL Final   Eosinophils Relative 05/03/2024 1  % Final   Eosinophils Absolute 05/03/2024 0.1  0.0 - 0.5 K/uL Final   Basophils Relative 05/03/2024 1  % Final   Basophils Absolute 05/03/2024 0.0  0.0 - 0.1 K/uL Final   Immature Granulocytes 05/03/2024 0  % Final   Abs Immature Granulocytes 05/03/2024 0.02  0.00 - 0.07 K/uL Final   Performed at [facility], [address] [place], [place] [address]   Sodium 05/03/2024 138  135 - 145 mmol/L Final   Potassium 05/03/2024 4.4  3.5 - 5.1 mmol/L Final   Chloride 05/03/2024 105  98 - 111 mmol/L Final   CO2 05/03/2024 23  22 - 32 mmol/L Final   Glucose, Bld 05/03/2024 99  70 - 99 mg/dL Final   Glucose reference range applies only to samples taken after fasting for at least 8 hours.   BUN 05/03/2024 33 (H)  6 - 20 mg/dL Final   Creatinine, Ser 05/03/2024 1.98 (H)  0.61 - 1.24 mg/dL Final   Calcium  05/03/2024 8.6 (L)  8.9 - 10.3 mg/dL Final   Total Protein 94/81/7974 7.1  6.5 - 8.1 g/dL Final   Albumin 94/81/7974 3.7  3.5 - 5.0 g/dL Final   AST 94/81/7974 25  15 - 41 U/L Final   ALT 05/03/2024 19  0 - 44 U/L Final   Alkaline Phosphatase 05/03/2024 66  38 - 126 U/L Final   Total Bilirubin 05/03/2024 0.8   0.0 - 1.2 mg/dL Final   GFR, Estimated 05/03/2024 38 (L)  >60 mL/min Final   Comment: (NOTE) Calculated using the CKD-EPI Creatinine Equation (2021)    Anion gap 05/03/2024 10  5 - 15 Final   Performed at [facility], [address] [place], [place] [address]   Lipase 05/03/2024 23  11 - 51 U/L Final  Performed at [facility], [address] [place], [place] [address]   Troponin I (High Sensitivity) 05/03/2024 25 (H)  <18 ng/L Final   Comment: (NOTE) Elevated high sensitivity troponin I (hsTnI) values and significant  changes across serial measurements may suggest ACS but many other  chronic and acute conditions are known to elevate hsTnI results.  Refer to the Links section for chest pain algorithms and additional  guidance. Performed at [facility], [address] [place], [place] [address]    Alcohol, Ethyl (B) 05/03/2024 <15  <15 mg/dL Final   Comment: Please note change in reference range. (NOTE) For medical purposes only. Performed at [facility], [address] [place], [place] [address]    SARS Coronavirus 2 by RT PCR 05/03/2024 NEGATIVE  NEGATIVE Final   Comment: (NOTE) SARS-CoV-2 target nucleic acids are NOT DETECTED.  The SARS-CoV-2 RNA is generally detectable in upper respiratory specimens during the acute phase of infection. The lowest concentration of SARS-CoV-2 viral copies this assay can detect is 138 copies/mL. A negative result does not preclude SARS-Cov-2 infection and should not be used as the sole basis for treatment or other patient management decisions. A negative result may occur with  improper specimen collection/handling, submission of specimen other than nasopharyngeal swab, presence of viral mutation(s) within the areas targeted by this assay, and inadequate number of viral copies(<138 copies/mL). A negative result must be combined with clinical observations, patient history, and  epidemiological information. The expected result is Negative.  Fact Sheet for Patients:  BloggerCourse.com  Fact Sheet for Healthcare Providers:  SeriousBroker.it  This test is no                          t yet approved or cleared by the [place]  FDA and  has been authorized for detection and/or diagnosis of SARS-CoV-2 by FDA under an Emergency Use Authorization (EUA). This EUA will remain  in effect (meaning this test can be used) for the duration of the COVID-19 declaration under Section 564(b)(1) of the Act, 21 U.S.C.section 360bbb-3(b)(1), unless the authorization is terminated  or revoked sooner.       Influenza A by PCR 05/03/2024 NEGATIVE  NEGATIVE Final   Influenza B by PCR 05/03/2024 NEGATIVE  NEGATIVE Final   Comment: (NOTE) The Xpert Xpress SARS-CoV-2/FLU/RSV plus assay is intended as an aid in the diagnosis of influenza from Nasopharyngeal swab specimens and should not be used as a sole basis for treatment. Nasal washings and aspirates are unacceptable for Xpert Xpress SARS-CoV-2/FLU/RSV testing.  Fact Sheet for Patients: BloggerCourse.com  Fact Sheet for Healthcare Providers: SeriousBroker.it  This test is not yet approved or cleared by the [place]  FDA and has been authorized for detection and/or diagnosis of SARS-CoV-2 by FDA under an Emergency Use Authorization (EUA). This EUA will remain in effect (meaning this test can be used) for the duration of the COVID-19 declaration under Section 564(b)(1) of the Act, 21 U.S.C. section 360bbb-3(b)(1), unless the authorization is terminated or revoked.     Resp Syncytial Virus by PCR 05/03/2024 NEGATIVE  NEGATIVE Final   Comment: (NOTE) Fact Sheet for Patients: BloggerCourse.com  Fact Sheet for Healthcare Providers: SeriousBroker.it  This test is not yet  approved or cleared by the [place]  FDA and has been authorized for detection and/or diagnosis of SARS-CoV-2 by FDA under an Emergency Use Authorization (EUA). This EUA will remain in effect (meaning this test can be used) for  the duration of the COVID-19 declaration under Section 564(b)(1) of the Act, 21 U.S.C. section 360bbb-3(b)(1), unless the authorization is terminated or revoked.  Performed at [facility], [address] [place], [place] [address]    Troponin I (High Sensitivity) 05/03/2024 22 (H)  <18 ng/L Final   Comment: (NOTE) Elevated high sensitivity troponin I (hsTnI) values and significant  changes across serial measurements may suggest ACS but many other  chronic and acute conditions are known to elevate hsTnI results.  Refer to the Links section for chest pain algorithms and additional  guidance. Performed at [facility], [address] [place], [place] [address]    Color, Urine 05/03/2024 YELLOW (A)  YELLOW Final   APPearance 05/03/2024 HAZY (A)  CLEAR Final   Specific Gravity, Urine 05/03/2024 1.044 (H)  1.005 - 1.030 Final   pH 05/03/2024 5.0  5.0 - 8.0 Final   Glucose, UA 05/03/2024 NEGATIVE  NEGATIVE mg/dL Final   Hgb urine dipstick 05/03/2024 NEGATIVE  NEGATIVE Final   Bilirubin Urine 05/03/2024 NEGATIVE  NEGATIVE Final   Ketones, ur 05/03/2024 NEGATIVE  NEGATIVE mg/dL Final   Protein, ur 94/81/7974 NEGATIVE  NEGATIVE mg/dL Final   Nitrite 94/81/7974 NEGATIVE  NEGATIVE Final   Leukocytes,Ua 05/03/2024 MODERATE (A)  NEGATIVE Final   RBC / HPF 05/03/2024 >50  0 - 5 RBC/hpf Final   WBC, UA 05/03/2024 0-5  0 - 5 WBC/hpf Final   Bacteria, UA 05/03/2024 RARE (A)  NONE SEEN Final   Squamous Epithelial / HPF 05/03/2024 0-5  0 - 5 /HPF Final   Mucus 05/03/2024 PRESENT   Final   Budding Yeast 05/03/2024 PRESENT   Final   Performed at [facility], [address] [place], [place] [address]   Campylobacter species 05/03/2024  NOT DETECTED  NOT DETECTED Final   Plesimonas shigelloides 05/03/2024 NOT DETECTED  NOT DETECTED Final   Salmonella species 05/03/2024 NOT DETECTED  NOT DETECTED Final   Yersinia enterocolitica 05/03/2024 NOT DETECTED  NOT DETECTED Final   Vibrio species 05/03/2024 NOT DETECTED  NOT DETECTED Final   Vibrio cholerae 05/03/2024 NOT DETECTED  NOT DETECTED Final   Enteroaggregative E coli (EAEC) 05/03/2024 NOT DETECTED  NOT DETECTED Final   Enteropathogenic E coli (EPEC) 05/03/2024 NOT DETECTED  NOT DETECTED Final   Enterotoxigenic E coli (ETEC) 05/03/2024 NOT DETECTED  NOT DETECTED Final   Shiga like toxin producing E coli * 05/03/2024 NOT DETECTED  NOT DETECTED Final   Shigella/Enteroinvasive E coli (EI* 05/03/2024 NOT DETECTED  NOT DETECTED Final   Cryptosporidium 05/03/2024 NOT DETECTED  NOT DETECTED Final   Cyclospora cayetanensis 05/03/2024 NOT DETECTED  NOT DETECTED Final   Entamoeba histolytica 05/03/2024 NOT DETECTED  NOT DETECTED Final   Giardia lamblia 05/03/2024 NOT DETECTED  NOT DETECTED Final   Adenovirus F40/41 05/03/2024 NOT DETECTED  NOT DETECTED Final   Astrovirus 05/03/2024 NOT DETECTED  NOT DETECTED Final   Norovirus GI/GII 05/03/2024 DETECTED (A)  NOT DETECTED Final   Comment: RESULT CALLED TO, READ BACK BY AND VERIFIED WITH: ANGELA R RN [id number] 05/04/24 HNM    Rotavirus A 05/03/2024 NOT DETECTED  NOT DETECTED Final   Sapovirus (I, II, IV, and V) 05/03/2024 NOT DETECTED  NOT DETECTED Final   Performed at [facility], [address] [place], [place] [address]   Sodium 05/04/2024 140  135 - 145 mmol/L Final   Potassium 05/04/2024 4.5  3.5 - 5.1 mmol/L Final   Chloride 05/04/2024 111  98 -  111 mmol/L Final   CO2 05/04/2024 26  22 - 32 mmol/L Final   Glucose, Bld 05/04/2024 83  70 - 99 mg/dL Final   Glucose reference range applies only to samples taken after fasting for at least 8 hours.   BUN 05/04/2024 30 (H)  6 - 20 mg/dL Final   Creatinine, Ser 05/04/2024 1.32  (H)  0.61 - 1.24 mg/dL Final   Calcium  05/04/2024 8.1 (L)  8.9 - 10.3 mg/dL Final   GFR, Estimated 05/04/2024 >60  >60 mL/min Final   Comment: (NOTE) Calculated using the CKD-EPI Creatinine Equation (2021)    Anion gap 05/04/2024 3 (L)  5 - 15 Final   Performed at [facility], [address] [place], [place] [address]   Tricyclic, Ur Screen 05/03/2024 NONE DETECTED  NONE DETECTED Final   Amphetamines, Ur Screen 05/03/2024 NONE DETECTED  NONE DETECTED Final   MDMA (Ecstasy)Ur Screen 05/03/2024 NONE DETECTED  NONE DETECTED Final   Cocaine Metabolite,Ur [place] 05/03/2024 POSITIVE (A)  NONE DETECTED Final   Opiate, Ur Screen 05/03/2024 POSITIVE (A)  NONE DETECTED Final   Phencyclidine (PCP) Ur S 05/03/2024 NONE DETECTED  NONE DETECTED Final   Cannabinoid 50 Ng, Ur [place] 05/03/2024 NONE DETECTED  NONE DETECTED Final   Barbiturates, Ur Screen 05/03/2024 NONE DETECTED  NONE DETECTED Final   Benzodiazepine, Ur Scrn 05/03/2024 POSITIVE (A)  NONE DETECTED Final   Methadone Scn, Ur 05/03/2024 NONE DETECTED  NONE DETECTED Final   Comment: (NOTE) Tricyclics + metabolites, urine    Cutoff 1000 ng/mL Amphetamines + metabolites, urine  Cutoff 1000 ng/mL MDMA (Ecstasy), urine              Cutoff 500 ng/mL Cocaine Metabolite, urine          Cutoff 300 ng/mL Opiate + metabolites, urine        Cutoff 300 ng/mL Phencyclidine (PCP), urine         Cutoff 25 ng/mL Cannabinoid, urine                 Cutoff 50 ng/mL Barbiturates + metabolites, urine  Cutoff 200 ng/mL Benzodiazepine, urine              Cutoff 200 ng/mL Methadone, urine                   Cutoff 300 ng/mL  The urine drug screen provides only a preliminary, unconfirmed analytical test result and should not be used for non-medical purposes. Clinical consideration and professional judgment should be applied to any positive drug screen result due to possible interfering substances. A more specific alternate chemical method must be used in  order to obtain a confirmed analytical result. Gas chromatography / mass spectrometry (GC/MS) is the preferred confirm                          atory method. Performed at [facility], [address] [place], [place] [address]   Admission on 04/11/2024, Discharged on 04/11/2024  Component Date Value Ref Range Status   Lipase 04/11/2024 36  11 - 51 U/L Final   Performed at [facility], [address] [place], [place] [address]   Sodium 04/11/2024 136  135 - 145 mmol/L Final   Potassium 04/11/2024 4.2  3.5 - 5.1 mmol/L Final   Chloride 04/11/2024 107  98 - 111 mmol/L Final   CO2 04/11/2024 23  22 - 32 mmol/L Final   Glucose,  Bld 04/11/2024 94  70 - 99 mg/dL Final   Glucose reference range applies only to samples taken after fasting for at least 8 hours.   BUN 04/11/2024 27 (H)  6 - 20 mg/dL Final   Creatinine, Ser 04/11/2024 1.47 (H)  0.61 - 1.24 mg/dL Final   Calcium  04/11/2024 8.7 (L)  8.9 - 10.3 mg/dL Final   Total Protein 95/73/7974 6.5  6.5 - 8.1 g/dL Final   Albumin 95/73/7974 3.5  3.5 - 5.0 g/dL Final   AST 95/73/7974 21  15 - 41 U/L Final   ALT 04/11/2024 17  0 - 44 U/L Final   Alkaline Phosphatase 04/11/2024 67  38 - 126 U/L Final   Total Bilirubin 04/11/2024 0.2  0.0 - 1.2 mg/dL Final   GFR, Estimated 04/11/2024 55 (L)  >60 mL/min Final   Comment: (NOTE) Calculated using the CKD-EPI Creatinine Equation (2021)    Anion gap 04/11/2024 6  5 - 15 Final   Performed at [facility], [address] [place], [place] [address]   WBC 04/11/2024 3.4 (L)  4.0 - 10.5 K/uL Final   RBC 04/11/2024 4.02 (L)  4.22 - 5.81 MIL/uL Final   Hemoglobin 04/11/2024 13.7  13.0 - 17.0 g/dL Final   HCT 95/73/7974 40.2  39.0 - 52.0 % Final   MCV 04/11/2024 100.0  80.0 - 100.0 fL Final   MCH 04/11/2024 34.1 (H)  26.0 - 34.0 pg Final   MCHC 04/11/2024 34.1  30.0 - 36.0 g/dL Final   RDW 95/73/7974 12.4  11.5 - 15.5 % Final   Platelets 04/11/2024 216  150 - 400 K/uL  Final   nRBC 04/11/2024 0.0  0.0 - 0.2 % Final   Performed at [facility], [address], [place], [place] [address]  Hospital Outpatient Visit on 01/14/2024  Component Date Value Ref Range Status   Hgb A1c MFr Bld 01/14/2024 5.8 (H)  4.8 - 5.6 % Final   Comment: (NOTE) Pre diabetes:          5.7%-6.4%  Diabetes:              >6.4%  Glycemic control for   <7.0% adults with diabetes    Mean Plasma Glucose 01/14/2024 119.76  mg/dL Final   Performed at [facility], [address], [place], [place] [address]   Sodium 01/14/2024 139  135 - 145 mmol/L Final   Potassium 01/14/2024 4.2  3.5 - 5.1 mmol/L Final   Chloride 01/14/2024 108  98 - 111 mmol/L Final   CO2 01/14/2024 21 (L)  22 - 32 mmol/L Final   Glucose, Bld 01/14/2024 99  70 - 99 mg/dL Final   Glucose reference range applies only to samples taken after fasting for at least 8 hours.   BUN 01/14/2024 27 (H)  6 - 20 mg/dL Final   Creatinine, Ser 01/14/2024 1.15  0.61 - 1.24 mg/dL Final   Calcium  01/14/2024 9.0  8.9 - 10.3 mg/dL Final   Total Protein 98/71/7974 6.7  6.5 - 8.1 g/dL Final   Albumin 98/71/7974 3.8  3.5 - 5.0 g/dL Final   AST 98/71/7974 18  15 - 41 U/L Final   ALT 01/14/2024 15  0 - 44 U/L Final   Alkaline Phosphatase 01/14/2024 68  38 - 126 U/L Final   Total Bilirubin 01/14/2024 0.4  0.0 - 1.2 mg/dL Final   GFR, Estimated 01/14/2024 >60  >60 mL/min Final   Comment: (NOTE) Calculated using the CKD-EPI  Creatinine Equation (2021)    Anion gap 01/14/2024 10  5 - 15 Final   Performed at [facility], [address] [place], [place] [address]   WBC 01/14/2024 2.9 (L)  4.0 - 10.5 K/uL Final   RBC 01/14/2024 4.26  4.22 - 5.81 MIL/uL Final   Hemoglobin 01/14/2024 14.1  13.0 - 17.0 g/dL Final   HCT 98/71/7974 41.6  39.0 - 52.0 % Final   MCV 01/14/2024 97.7  80.0 - 100.0 fL Final   MCH 01/14/2024 33.1  26.0 - 34.0 pg Final   MCHC 01/14/2024 33.9  30.0 - 36.0 g/dL Final   RDW 98/71/7974 12.0   11.5 - 15.5 % Final   Platelets 01/14/2024 215  150 - 400 K/uL Final   nRBC 01/14/2024 0.0  0.0 - 0.2 % Final   Neutrophils Relative % 01/14/2024 34  % Final   Neutro Abs 01/14/2024 1.0 (L)  1.7 - 7.7 K/uL Final   Lymphocytes Relative 01/14/2024 54  % Final   Lymphs Abs 01/14/2024 1.5  0.7 - 4.0 K/uL Final   Monocytes Relative 01/14/2024 9  % Final   Monocytes Absolute 01/14/2024 0.3  0.1 - 1.0 K/uL Final   Eosinophils Relative 01/14/2024 2  % Final   Eosinophils Absolute 01/14/2024 0.1  0.0 - 0.5 K/uL Final   Basophils Relative 01/14/2024 1  % Final   Basophils Absolute 01/14/2024 0.0  0.0 - 0.1 K/uL Final   Immature Granulocytes 01/14/2024 0  % Final   Abs Immature Granulocytes 01/14/2024 0.01  0.00 - 0.07 K/uL Final   Performed at [facility], [address] [place], [place] [address]   QuantiFERON Incubation 01/14/2024 Incubation performed.   Final   QuantiFERON-TB Gold Plus 01/14/2024 Negative  Negative Final   Comment: (NOTE) No response to M tuberculosis antigens detected. Infection with M tuberculosis is unlikely, but high risk individuals should be considered for additional testing (ATS/IDSA/CDC Clinical Practice Guidelines, 2017). The reference range is an Antigen minus Nil result of <0.35 IU/mL. Chemiluminescence immunoassay methodology Performed At: [facility] [address] [place], [place] [id number] Jennette Shorter MD Ey:[id number]    RPR Ser Ql 01/14/2024 NON REACTIVE  NON REACTIVE Final   Performed at [facility], [address], [place], [place] [address]   Absolute CD 4 Helper 01/14/2024 555  359 - 1,519 /uL Final   % CD 4 Pos. Lymph. 01/14/2024 34.7  30.8 - 58.5 % Final   CD3+CD8+ Cells # Bld 01/14/2024 835  109 - 897 /uL Final   CD3+CD8+ Cells NFr Bld 01/14/2024 52.2 (H)  12.0 - 35.5 % Final   CD3+CD4+ Cells/CD3+CD8+ Cells Bld 01/14/2024 0.66 (L)  0.92 - 3.72 Final   WBC 01/14/2024 2.9 (L)  3.4 - 10.8 x10E3/uL Final   RBC  01/14/2024 4.22  4.14 - 5.80 x10E6/uL Final   Ovalocytes present.   Hemoglobin 01/14/2024 14.4  13.0 - 17.7 g/dL Final   Hematocrit 98/71/7974 42.6  37.5 - 51.0 % Final   MCV 01/14/2024 101 (H)  79 - 97 fL Final   MCH 01/14/2024 34.1 (H)  26.6 - 33.0 pg Final   MCHC 01/14/2024 33.8  31.5 - 35.7 g/dL Final   RDW 98/71/7974 11.9  11.6 - 15.4 % Final   Platelets 01/14/2024 220  150 - 450 x10E3/uL Final   Neutrophils 01/14/2024 34  Not Estab. % Final   Lymphs 01/14/2024 53  Not Estab. % Final   Monocytes 01/14/2024 9  Not  Estab. % Final   Eos 01/14/2024 3  Not Estab. % Final   Basos 01/14/2024 1  Not Estab. % Final   Neutrophils Absolute 01/14/2024 1.0 (L)  1.4 - 7.0 x10E3/uL Final   Lymphocytes Absolute 01/14/2024 1.6  0.7 - 3.1 x10E3/uL Final   Monocytes Absolute 01/14/2024 0.3  0.1 - 0.9 x10E3/uL Final   EOS (ABSOLUTE) 01/14/2024 0.1  0.0 - 0.4 x10E3/uL Final   Basophils Absolute 01/14/2024 0.0  0.0 - 0.2 x10E3/uL Final   Immature Granulocytes 01/14/2024 0  Not Estab. % Final   Immature Grans (Abs) 01/14/2024 0.0  0.0 - 0.1 x10E3/uL Final   Hematology Comments: 01/14/2024 Note:   Corrected   Comment: (NOTE) Verified by microscopic examination. Performed At: [facility] [address] [place], [place] [id number] Jennette Shorter MD Ey:[id number]    HIV 1 RNA Quant 01/14/2024 40  copies/mL Corrected   Comment: (NOTE) The reportable range for this assay is 20 to 10,000,000 copies HIV-1 RNA/mL.    LOG10 HIV-1 RNA 01/14/2024 1.602  log10copy/mL Final   Comment: (NOTE) Performed At: [facility] [address] [place], [place] [id number] Jennette Shorter MD Ey:[id number]    QuantiFERON Criteria 01/14/2024 Comment   Final   Comment: (NOTE) QuantiFERON-TB Gold Plus is a qualitative indirect test for M tuberculosis infection (including disease) and is intended for use in conjunction with risk assessment, radiography, and other medical and diagnostic evaluations. The  QuantiFERON-TB Gold Plus result is determined by subtracting the Nil value from either TB antigen (Ag) value. The Mitogen tube serves as a control for the test.    QuantiFERON TB1 Ag Value 01/14/2024 0.04  IU/mL Final   QuantiFERON TB2 Ag Value 01/14/2024 0.05  IU/mL Final   QuantiFERON Nil Value 01/14/2024 0.06  IU/mL Final   QuantiFERON Mitogen Value 01/14/2024 >10.00  IU/mL Final   Comment: (NOTE) Performed At: [facility] [address] [place], [place] [id number] Jennette Shorter MD Ey:[id number]     Blood Alcohol level:  Lab Results  Component Value Date   [facility] <15 06/03/2024   ETH <15 05/03/2024    Metabolic Disorder Labs: Lab Results  Component Value Date   HGBA1C 5.8 (H) 01/14/2024   MPG 119.76 01/14/2024   MPG 102.54 05/27/2018   No results found for: PROLACTIN Lab Results  Component Value Date   CHOL 158 01/23/2019   TRIG 63 01/23/2019   HDL 43 01/23/2019   CHOLHDL 3.7 01/23/2019   VLDL 13 01/23/2019   LDLCALC 102 (H) 01/23/2019   LDLCALC 67 05/27/2018    Therapeutic Lab Levels: No results found for: LITHIUM No results found for: VALPROATE No results found for: CBMZ  Physical Findings   AIMS    Flowsheet Row [facility]) [facility] Ad[facility] (Discharged) from 02/20/2017 in [facility]  AIMS Total Score 0 0   AUDIT    Flowsheet Row Admission (Discharged) from 11/01/2020 in [facility] INPATIENT BEHAVIORAL MEDICINE Admission (Discharged) from 10/18/2020 in [facility] INPATIENT BEHAVIORAL MEDICINE Admission (Discharged) from 05/18/2020 in [facility]PAT[facility]ICINE Admission (Discharged[facility] Admission (Discharged) from 02/19/2019 in [facility] INPATIENT BEHAVIORAL MEDICINE  Alcohol Use Disorder Identification Test Final Score (AUDIT) 4 0 14 0 0   PHQ2-9    Flowsheet Row ED from 06/03/2024 in [facility] Office Visit from 01/14/2024 in [facility] Office Visit from 04/02/2023 in [facility] Office  Visit from 12/20/2022 in [facility]  PHQ-2 Total Score 2 1 1 1   PHQ-9 Total Score 16 -- -- --   Flowsheet Row ED from 06/03/2024 in [facility] Most recent reading at 06/03/2024  5:42 PM ED from 06/03/2024 in [facility] Emergency Department at [facility] Most recent reading at 06/03/2024  1:56 PM ED to Hosp-Admission (Discharged) from 05/03/2024 in [facility] 1C MEDICAL TELEMETRY Most recent reading at 05/03/2024  9:58 AM  C-SSRS RISK CATEGORY No Risk No Risk No Risk    Psychiatric Specialty Exam  Presentation  General Appearance:  Appropriate for Environment; Casual  Eye Contact: Good  Speech: Clear and Coherent; Normal Rate  Speech Volume: Normal  Mood and Affect  Mood: Euthymic  Affect: Appropriate; Congruent  Thought Process  Thought Processes: Coherent; Goal Directed; Linear  Descriptions of Associations:Intact  Orientation:Full (Time, Place and Person)  Thought Content:Logical  Diagnosis of Schizophrenia or Schizoaffective disorder in past: No data recorded   Hallucinations:Hallucinations: None  Ideas of Reference:None  Suicidal Thoughts:Suicidal Thoughts: No  Homicidal Thoughts:Homicidal Thoughts: No  Sensorium  Memory: Immediate Good  Judgment: Fair  Insight: Fair  Executive Functions  Concentration: Good  Attention Span: Good  Recall: Fair  Fund of Knowledge: Fair  Language: Fair  Psychomotor Activity  Psychomotor Activity: Psychomotor Activity: Normal  Assets  Assets: Communication Skills; Desire for Improvement; Financial Resources/Insurance; Housing; Resilience; Social Support   Sleep  Sleep: better   Physical Exam  Physical Exam ROS Blood pressure 135/85, pulse (!) 52, temperature 97.7 F (36.5 C),  temperature source Oral, resp. rate 20, SpO2 99%. There is no height or weight on file to calculate BMI.  Medical Decision Making  Niles Ess is a 58 y.o. male with a past psychiatric history of polysubstance abuse, alcohol abuse, cocaine abuse, tobacco use  who presented to [facility] for detox.  [facility] for detox prior to placement at treatment facility.   Patient continues to want to detox from polysubstance's. He is experiencing mild withdrawal symptoms at the moment and he will continue to stay on the floor for symptomatic stabilization and medication management as needed.  Will speak with social work about potential treatment facility options versus alternative resources available.   Recommendations  Refer to [facility] for detailed medication list  Polysubstance Abuse  Alcohol Use Disorder  Cocaine Use Disorder  Opioid Use Disorder Tobacco Use   - CIWA discontinued 6/21 due to limited symptoms consistent with CIWA=0 - SW following, to consider goals of treatment and resources including plan for SAIOP and return to outpt ACTT within 48-72hrs -Thiamine  100 mg daily for nutritional supplementation - Multivitamin daily with nutrients  - Ensure TID with meals PRN? -ondansetron  4 mg ODT every 6 hours prn nausea/vomiting -loperamide  2-4 mg capsule prn diarrhea or loose stools    HIV  -Continue home Biktarvy    Dyslipidemia  -Continue home rosuvastatin    Hypertension  -Continue home losartan   KANDI JAYSON HAHN, MD 06/06/2024 12:20 PM

## 2024-06-06 NOTE — ED Notes (Signed)
 Pt states that his pulse is chronically low since his heart attack 5-6 years ago. The part of my heard that pumps my blood out is slow. Pt denies any symptoms. Will continue to monitor.

## 2024-06-06 NOTE — Group Note (Signed)
 Group Topic: Relapse and Recovery  Group Date: 06/06/2024 Start Time: 2000 End Time: 2100 Facilitators: Joan Plowman B  Department: [facility]  Number of Participants: 6  Group Focus: abuse issues, check in, communication, community group, and coping skills Treatment Modality:  Individual Therapy Interventions utilized were patient education, problem solving, and support Purpose: enhance coping skills, increase insight, and relapse prevention strategies  Name: Roy [name] Date of Birth: Apr 15, 1966  MR: [id number]    Level of Participation: Active Quality of Participation: cooperative Interactions with others: gave feedback Mood/Affect: appropriate Triggers (if applicable): NA Cognition: coherent/clear Progress: Gaining insight Response: NA Plan: patient will be encouraged to keep going to groups.   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

## 2024-06-07 DIAGNOSIS — Z8249 Family history of ischemic heart disease and other diseases of the circulatory system: Secondary | ICD-10-CM | POA: Diagnosis not present

## 2024-06-07 DIAGNOSIS — I10 Essential (primary) hypertension: Secondary | ICD-10-CM | POA: Diagnosis not present

## 2024-06-07 DIAGNOSIS — E785 Hyperlipidemia, unspecified: Secondary | ICD-10-CM | POA: Diagnosis not present

## 2024-06-07 DIAGNOSIS — Z809 Family history of malignant neoplasm, unspecified: Secondary | ICD-10-CM | POA: Diagnosis not present

## 2024-06-07 NOTE — ED Notes (Signed)
Pt sleeping at this hour. No apparent distress. RR even and unlabored. Monitored for safety.  

## 2024-06-07 NOTE — Group Note (Signed)
 Group Topic: Positive Affirmations  Group Date: 06/07/2024 Start Time: 1125 End Time: 1150 Facilitators: Celinda Suzen NOVAK, NT  Department: [facility]  Number of Participants: 9  Group Focus: self-esteem Treatment Modality:  Psychoeducation Interventions utilized were support Purpose: increase insight and regain self-worth  Name: Roy [name] Date of Birth: 09-05-1966  MR: [id number]    Level of Participation: active Quality of Participation: attentive and engaged Interactions with others: gave feedback Mood/Affect: appropriate Triggers (if applicable): n/a Cognition: coherent/clear Progress: Gaining insight Response: PT agreed with positive affirmation that he will not allow others' perceptions of him to define him Plan: patient will be encouraged to attend group  Patients Problems:  Patient Active Problem List   Diagnosis Date Noted   Polysubstance dependence (HCC) 06/03/2024   Generalized abdominal pain 05/04/2024   Gastroenteritis 05/04/2024   Impaired ambulation 05/03/2024   AKI (acute kidney injury) (HCC) 05/03/2024   Suicidal thoughts 06/16/2023   Cocaine abuse with cocaine-induced mood disorder (HCC) 11/13/2020   Allergic rhinitis 06/21/2020   GERD (gastroesophageal reflux disease) 06/21/2020   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   Arthritis pain, hip 05/18/2020   Status post total hip replacement, 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

## 2024-06-07 NOTE — ED Provider Notes (Signed)
 Behavioral Health Progress Note  Date and Time: 06/07/2024 1:15 PM Name: Roy [name] MRN:  [id number]  HPI: Roy [name] is a 58 y.o. male with a past psychiatric history of polysubstance abuse, alcohol abuse, cocaine abuse, tobacco use  who presented to [facility] for detox.  [facility] detox prior to placement at treatment facility.  Subjective:   Currently on assessment patient is observed sitting in the day room watching TV. He is pleasant upon approach. He is alert/oriented x 4, cooperative, and attentive. He has normal speech and behavior. He is currently denying any depression or anxiety. He denies any concerns with appetite or sleep. He has a euthymic affect. He denies SI/HI/AVH.  He is currently denying any alcohol withdrawal symptoms.He is tolerating the naltrexone  with out any adverse reactions and denies any alcohol cravings. He feels confident that he will be able to maintain his sobriety. He identifies his 39 year old granddaughter has his biggest motivation factor. He recently was able to make contact with her after 1.5 years. He looks forward to being discharged and being able to see her in person.   He continues to be interested in [facility] upon d/c.    Diagnosis:  Final diagnoses:  HIV disease (HCC)  Hypertension, unspecified type  Dyslipidemia  Alcohol use disorder, severe, dependence (HCC)  Cocaine abuse with cocaine-induced mood disorder (HCC)  Tobacco use   Alcohol use disorder   Total Time spent with patient: 20 minutes   Past Psychiatric History: Polysubstance abuse, self-reported MDD and bipolar depression.  Has been off mirtazapine  and risperidone  for 1 year, due to lack of need for symptomatic control.  Patient [place] [facility] [place].  Patient has an ACTT.   Past Medical History: HIV, hypertension, dyslipidemia, arthritis,  AMI per patient report  Family History: Per EMR mother with coronary artery disease, hyperlipidemia, uterine cancer and hypertension.  Brother with cancer Father with ethanol abuse, son 1 with crack cocaine use, son 2 with crack cocaine, marijuana and ethanol abuse  Social History: Patient currently lives alone (apartment) in [place] .  He has a significant other for social support.  Patient currently on disability.   Current Medications:  Current Facility-Administered Medications  Medication Dose Route Frequency Provider Last Rate Last Admin   acetaminophen  (TYLENOL ) tablet 650 mg  650 mg Oral Q6H PRN [name], Roy C, MD   650 mg at 06/06/24 2001   alum & mag hydroxide-simeth (MAALOX/MYLANTA) 200-200-20 MG/5ML suspension 30 mL  30 mL Oral Q4H PRN Roy Kandi BROCKS, MD       bictegravir-emtricitabine -tenofovir  AF (BIKTARVY ) 50-200-25 MG per tablet 1 tablet  1 tablet Oral Daily Roy Calin, MD   1 tablet at 06/07/24 0931   haloperidol (HALDOL) tablet 5 mg  5 mg Oral TID PRN Roy Kandi BROCKS, MD       And   diphenhydrAMINE  (BENADRYL ) capsule 50 mg  50 mg Oral TID PRN [name], Roy C, MD       haloperidol lactate (HALDOL) injection 5 mg  5 mg Intramuscular TID PRN Roy Kandi BROCKS, MD       And   diphenhydrAMINE  (BENADRYL ) injection 50 mg  50 mg Intramuscular TID PRN Roy Kandi BROCKS, MD       haloperidol lactate (HALDOL) injection 10 mg  10 mg Intramuscular TID PRN Roy Kandi BROCKS, MD  And   diphenhydrAMINE  (BENADRYL ) injection 50 mg  50 mg Intramuscular TID PRN Roy Kandi BROCKS, MD       And   LORazepam  (ATIVAN ) injection 2 mg  2 mg Intramuscular TID PRN Roy Kandi BROCKS, MD       feeding supplement (ENSURE ENLIVE / ENSURE PLUS) liquid 296 mL  296 mL Oral TID with meals Roy Calin, MD   296 mL at 06/07/24 9182   hydrOXYzine  (ATARAX ) tablet 25 mg  25 mg Oral TID PRN Roy Kandi BROCKS, MD   25 mg at 06/06/24 2113   loperamide  (IMODIUM ) capsule 2  mg  2 mg Oral PRN Roy Calin, MD       loratadine  (CLARITIN ) tablet 10 mg  10 mg Oral QHS Roy Calin, MD   10 mg at 06/06/24 2113   losartan  (COZAAR ) tablet 100 mg  100 mg Oral q AM Roy Calin, MD   100 mg at 06/07/24 0931   magnesium  hydroxide (MILK OF MAGNESIA) suspension 30 mL  30 mL Oral Daily PRN [name], Roy C, MD       multivitamin with minerals tablet 1 tablet  1 tablet Oral Daily Roy Calin, MD   1 tablet at 06/07/24 0931   naltrexone  (DEPADE) tablet 50 mg  50 mg Oral Daily [name], Roy C, MD   50 mg at 06/07/24 0931   ondansetron  (ZOFRAN -ODT) disintegrating tablet 4 mg  4 mg Oral Q8H PRN Roy Calin, MD   4 mg at 06/05/24 1639   rosuvastatin  (CRESTOR ) tablet 20 mg  20 mg Oral Daily Rainey, Donovan, MD   20 mg at 06/07/24 9068   traZODone  (DESYREL ) tablet 100 mg  100 mg Oral QHS PRN Roy Calin, MD   100 mg at 06/06/24 2113   Current Outpatient Medications  Medication Sig Dispense Refill   albuterol  (VENTOLIN  HFA) 108 (90 Base) MCG/ACT inhaler Inhale 1-2 puffs into the lungs every 4 (four) hours as needed for wheezing or shortness of breath. 18 g 1   BIKTARVY  50-200-25 MG TABS tablet Take 1 tablet by mouth every morning. 30 tablet 6   cetirizine (ZYRTEC) 10 MG tablet Take 1 tablet by mouth daily.     loperamide  (IMODIUM ) 2 MG capsule Take 1 capsule (2 mg total) by mouth as needed for diarrhea or loose stools. (Patient not taking: Reported on 06/03/2024) 30 capsule 0   losartan  (COZAAR ) 100 MG tablet Take 1 tablet (100 mg total) by mouth every morning. Hold for next 3 days     mirtazapine  (REMERON ) 7.5 MG tablet Take 1 tablet (7.5 mg total) by mouth at bedtime. (Patient not taking: Reported on 05/03/2024) 30 tablet 1   ondansetron  (ZOFRAN -ODT) 4 MG disintegrating tablet Take 1 tablet (4 mg total) by mouth every 8 (eight) hours as needed for nausea or vomiting. (Patient not taking: Reported on 06/03/2024) 20 tablet 0   risperiDONE  (RISPERDAL ) 1 MG tablet  Take 1 tablet (1 mg total) by mouth at bedtime. (Patient not taking: Reported on 05/03/2024) 30 tablet 1   rosuvastatin  (CRESTOR ) 20 MG tablet Take 20 mg by mouth daily.      Labs  Lab Results:  Admission on 06/03/2024  Component Date Value Ref Range Status   SARS Coronavirus 2 by RT PCR 06/05/2024 NEGATIVE  NEGATIVE Final   Performed at [facility], [address], [place], [place] [address]  Admission on 06/03/2024, Discharged on 06/03/2024  Component Date Value Ref Range Status   Sodium 06/03/2024 138  135 - 145 mmol/L Final   Potassium 06/03/2024 4.1  3.5 - 5.1 mmol/L Final   Chloride 06/03/2024 104  98 - 111 mmol/L Final   CO2 06/03/2024 25  22 - 32 mmol/L Final   Glucose, Bld 06/03/2024 101 (H)  70 - 99 mg/dL Final   Glucose reference range applies only to samples taken after fasting for at least 8 hours.   BUN 06/03/2024 23 (H)  6 - 20 mg/dL Final   Creatinine, Ser 06/03/2024 1.40 (H)  0.61 - 1.24 mg/dL Final   Calcium  06/03/2024 9.0  8.9 - 10.3 mg/dL Final   Total Protein 93/81/7974 7.7  6.5 - 8.1 g/dL Final   Albumin 93/81/7974 4.3  3.5 - 5.0 g/dL Final   AST 93/81/7974 20  15 - 41 U/L Final   ALT 06/03/2024 17  0 - 44 U/L Final   Alkaline Phosphatase 06/03/2024 70  38 - 126 U/L Final   Total Bilirubin 06/03/2024 0.6  0.0 - 1.2 mg/dL Final   GFR, Estimated 06/03/2024 58 (L)  >60 mL/min Final   Comment: (NOTE) Calculated using the CKD-EPI Creatinine Equation (2021)    Anion gap 06/03/2024 9  5 - 15 Final   Performed at [facility], [address] [place], [place] [address]   Alcohol, Ethyl (B) 06/03/2024 <15  <15 mg/dL Final   Comment: (NOTE) For medical purposes only. Performed at [facility], [address] [place], [place] [address]    WBC 06/03/2024 6.4  4.0 - 10.5 K/uL Final   RBC 06/03/2024 4.55  4.22 - 5.81 MIL/uL Final   Hemoglobin 06/03/2024 15.3  13.0 - 17.0 g/dL Final   HCT 93/81/7974 44.2  39.0 - 52.0 % Final   MCV  06/03/2024 97.1  80.0 - 100.0 fL Final   MCH 06/03/2024 33.6  26.0 - 34.0 pg Final   MCHC 06/03/2024 34.6  30.0 - 36.0 g/dL Final   RDW 93/81/7974 12.2  11.5 - 15.5 % Final   Platelets 06/03/2024 195  150 - 400 K/uL Final   nRBC 06/03/2024 0.0  0.0 - 0.2 % Final   Performed at [facility], [address] [place], [place] [address]   Tricyclic, Ur Screen 06/03/2024 NONE DETECTED  NONE DETECTED Final   Amphetamines, Ur Screen 06/03/2024 NONE DETECTED  NONE DETECTED Final   MDMA (Ecstasy)Ur Screen 06/03/2024 NONE DETECTED  NONE DETECTED Final   Cocaine Metabolite,Ur [place] 06/03/2024 POSITIVE (A)  NONE DETECTED Final   Opiate, Ur Screen 06/03/2024 NONE DETECTED  NONE DETECTED Final   Phencyclidine (PCP) Ur S 06/03/2024 NONE DETECTED  NONE DETECTED Final   Cannabinoid 50 Ng, Ur  06/03/2024 NONE DETECTED  NONE DETECTED Final   Barbiturates, Ur Screen 06/03/2024 NONE DETECTED  NONE DETECTED Final   Benzodiazepine, Ur Scrn 06/03/2024 NONE DETECTED  NONE DETECTED Final   Methadone Scn, Ur 06/03/2024 NONE DETECTED  NONE DETECTED Final   Comment: (NOTE) Tricyclics + metabolites, urine    Cutoff 1000 ng/mL Amphetamines + metabolites, urine  Cutoff 1000 ng/mL MDMA (Ecstasy), urine              Cutoff 500 ng/mL Cocaine Metabolite, urine          Cutoff 300 ng/mL Opiate + metabolites, urine        Cutoff 300 ng/mL Phencyclidine (PCP), urine         Cutoff 25 ng/mL Cannabinoid, urine  Cutoff 50 ng/mL Barbiturates + metabolites, urine  Cutoff 200 ng/mL Benzodiazepine, urine              Cutoff 200 ng/mL Methadone, urine                   Cutoff 300 ng/mL  The urine drug screen provides only a preliminary, unconfirmed analytical test result and should not be used for non-medical purposes. Clinical consideration and professional judgment should be applied to any positive drug screen result due to possible interfering substances. A more specific alternate chemical method must  be used in order to obtain a confirmed analytical result. Gas chromatography / mass spectrometry (GC/MS) is the preferred confirm                          atory method. Performed at [facility], [address] [place], [place] [address]   Admission on 05/03/2024, Discharged on 05/04/2024  Component Date Value Ref Range Status   WBC 05/03/2024 6.1  4.0 - 10.5 K/uL Final   RBC 05/03/2024 4.12 (L)  4.22 - 5.81 MIL/uL Final   Hemoglobin 05/03/2024 13.7  13.0 - 17.0 g/dL Final   HCT 94/81/7974 40.6  39.0 - 52.0 % Final   MCV 05/03/2024 98.5  80.0 - 100.0 fL Final   MCH 05/03/2024 33.3  26.0 - 34.0 pg Final   MCHC 05/03/2024 33.7  30.0 - 36.0 g/dL Final   RDW 94/81/7974 12.6  11.5 - 15.5 % Final   Platelets 05/03/2024 170  150 - 400 K/uL Final   nRBC 05/03/2024 0.0  0.0 - 0.2 % Final   Neutrophils Relative % 05/03/2024 57  % Final   Neutro Abs 05/03/2024 3.5  1.7 - 7.7 K/uL Final   Lymphocytes Relative 05/03/2024 33  % Final   Lymphs Abs 05/03/2024 2.0  0.7 - 4.0 K/uL Final   Monocytes Relative 05/03/2024 8  % Final   Monocytes Absolute 05/03/2024 0.5  0.1 - 1.0 K/uL Final   Eosinophils Relative 05/03/2024 1  % Final   Eosinophils Absolute 05/03/2024 0.1  0.0 - 0.5 K/uL Final   Basophils Relative 05/03/2024 1  % Final   Basophils Absolute 05/03/2024 0.0  0.0 - 0.1 K/uL Final   Immature Granulocytes 05/03/2024 0  % Final   Abs Immature Granulocytes 05/03/2024 0.02  0.00 - 0.07 K/uL Final   Performed at [facility], [address] [place], [place] [address]   Sodium 05/03/2024 138  135 - 145 mmol/L Final   Potassium 05/03/2024 4.4  3.5 - 5.1 mmol/L Final   Chloride 05/03/2024 105  98 - 111 mmol/L Final   CO2 05/03/2024 23  22 - 32 mmol/L Final   Glucose, Bld 05/03/2024 99  70 - 99 mg/dL Final   Glucose reference range applies only to samples taken after fasting for at least 8 hours.   BUN 05/03/2024 33 (H)  6 - 20 mg/dL Final   Creatinine, Ser 05/03/2024 1.98  (H)  0.61 - 1.24 mg/dL Final   Calcium  05/03/2024 8.6 (L)  8.9 - 10.3 mg/dL Final   Total Protein 94/81/7974 7.1  6.5 - 8.1 g/dL Final   Albumin 94/81/7974 3.7  3.5 - 5.0 g/dL Final   AST 94/81/7974 25  15 - 41 U/L Final   ALT 05/03/2024 19  0 - 44 U/L Final   Alkaline Phosphatase 05/03/2024 66  38 - 126 U/L Final   Total Bilirubin 05/03/2024  0.8  0.0 - 1.2 mg/dL Final   GFR, Estimated 05/03/2024 38 (L)  >60 mL/min Final   Comment: (NOTE) Calculated using the CKD-EPI Creatinine Equation (2021)    Anion gap 05/03/2024 10  5 - 15 Final   Performed at [facility], [address] [place], [place] [address]   Lipase 05/03/2024 23  11 - 51 U/L Final   Performed at [facility], [address] [place], [place] [address]   Troponin I (High Sensitivity) 05/03/2024 25 (H)  <18 ng/L Final   Comment: (NOTE) Elevated high sensitivity troponin I (hsTnI) values and significant  changes across serial measurements may suggest ACS but many other  chronic and acute conditions are known to elevate hsTnI results.  Refer to the Links section for chest pain algorithms and additional  guidance. Performed at [facility], [address] [place], [place] [address]    Alcohol, Ethyl (B) 05/03/2024 <15  <15 mg/dL Final   Comment: Please note change in reference range. (NOTE) For medical purposes only. Performed at [facility], [address] [place], [place] [address]    SARS Coronavirus 2 by RT PCR 05/03/2024 NEGATIVE  NEGATIVE Final   Comment: (NOTE) SARS-CoV-2 target nucleic acids are NOT DETECTED.  The SARS-CoV-2 RNA is generally detectable in upper respiratory specimens during the acute phase of infection. The lowest concentration of SARS-CoV-2 viral copies this assay can detect is 138 copies/mL. A negative result does not preclude SARS-Cov-2 infection and should not be used as the sole basis for treatment or other patient management decisions. A  negative result may occur with  improper specimen collection/handling, submission of specimen other than nasopharyngeal swab, presence of viral mutation(s) within the areas targeted by this assay, and inadequate number of viral copies(<138 copies/mL). A negative result must be combined with clinical observations, patient history, and epidemiological information. The expected result is Negative.  Fact Sheet for Patients:  BloggerCourse.com  Fact Sheet for Healthcare Providers:  SeriousBroker.it  This test is no                          t yet approved or cleared by the [place]  FDA and  has been authorized for detection and/or diagnosis of SARS-CoV-2 by FDA under an Emergency Use Authorization (EUA). This EUA will remain  in effect (meaning this test can be used) for the duration of the COVID-19 declaration under Section 564(b)(1) of the Act, 21 U.S.C.section 360bbb-3(b)(1), unless the authorization is terminated  or revoked sooner.       Influenza A by PCR 05/03/2024 NEGATIVE  NEGATIVE Final   Influenza B by PCR 05/03/2024 NEGATIVE  NEGATIVE Final   Comment: (NOTE) The Xpert Xpress SARS-CoV-2/FLU/RSV plus assay is intended as an aid in the diagnosis of influenza from Nasopharyngeal swab specimens and should not be used as a sole basis for treatment. Nasal washings and aspirates are unacceptable for Xpert Xpress SARS-CoV-2/FLU/RSV testing.  Fact Sheet for Patients: BloggerCourse.com  Fact Sheet for Healthcare Providers: SeriousBroker.it  This test is not yet approved or cleared by the [place]  FDA and has been authorized for detection and/or diagnosis of SARS-CoV-2 by FDA under an Emergency Use Authorization (EUA). This EUA will remain in effect (meaning this test can be used) for the duration of the COVID-19 declaration under Section 564(b)(1) of the Act, 21  U.S.C. section 360bbb-3(b)(1), unless the authorization is terminated or revoked.     Resp Syncytial Virus  by PCR 05/03/2024 NEGATIVE  NEGATIVE Final   Comment: (NOTE) Fact Sheet for Patients: BloggerCourse.com  Fact Sheet for Healthcare Providers: SeriousBroker.it  This test is not yet approved or cleared by the [place]  FDA and has been authorized for detection and/or diagnosis of SARS-CoV-2 by FDA under an Emergency Use Authorization (EUA). This EUA will remain in effect (meaning this test can be used) for the duration of the COVID-19 declaration under Section 564(b)(1) of the Act, 21 U.S.C. section 360bbb-3(b)(1), unless the authorization is terminated or revoked.  Performed at [facility], [address] [place], [place] [address]    Troponin I (High Sensitivity) 05/03/2024 22 (H)  <18 ng/L Final   Comment: (NOTE) Elevated high sensitivity troponin I (hsTnI) values and significant  changes across serial measurements may suggest ACS but many other  chronic and acute conditions are known to elevate hsTnI results.  Refer to the Links section for chest pain algorithms and additional  guidance. Performed at [facility], [address] [place], [place] [address]    Color, Urine 05/03/2024 YELLOW (A)  YELLOW Final   APPearance 05/03/2024 HAZY (A)  CLEAR Final   Specific Gravity, Urine 05/03/2024 1.044 (H)  1.005 - 1.030 Final   pH 05/03/2024 5.0  5.0 - 8.0 Final   Glucose, UA 05/03/2024 NEGATIVE  NEGATIVE mg/dL Final   Hgb urine dipstick 05/03/2024 NEGATIVE  NEGATIVE Final   Bilirubin Urine 05/03/2024 NEGATIVE  NEGATIVE Final   Ketones, ur 05/03/2024 NEGATIVE  NEGATIVE mg/dL Final   Protein, ur 94/81/7974 NEGATIVE  NEGATIVE mg/dL Final   Nitrite 94/81/7974 NEGATIVE  NEGATIVE Final   Leukocytes,Ua 05/03/2024 MODERATE (A)  NEGATIVE Final   RBC / HPF 05/03/2024 >50  0 - 5 RBC/hpf Final   WBC, UA  05/03/2024 0-5  0 - 5 WBC/hpf Final   Bacteria, UA 05/03/2024 RARE (A)  NONE SEEN Final   Squamous Epithelial / HPF 05/03/2024 0-5  0 - 5 /HPF Final   Mucus 05/03/2024 PRESENT   Final   Budding Yeast 05/03/2024 PRESENT   Final   Performed at [facility], [address] [place], [place] [address]   Campylobacter species 05/03/2024 NOT DETECTED  NOT DETECTED Final   Plesimonas shigelloides 05/03/2024 NOT DETECTED  NOT DETECTED Final   Salmonella species 05/03/2024 NOT DETECTED  NOT DETECTED Final   Yersinia enterocolitica 05/03/2024 NOT DETECTED  NOT DETECTED Final   Vibrio species 05/03/2024 NOT DETECTED  NOT DETECTED Final   Vibrio cholerae 05/03/2024 NOT DETECTED  NOT DETECTED Final   Enteroaggregative E coli (EAEC) 05/03/2024 NOT DETECTED  NOT DETECTED Final   Enteropathogenic E coli (EPEC) 05/03/2024 NOT DETECTED  NOT DETECTED Final   Enterotoxigenic E coli (ETEC) 05/03/2024 NOT DETECTED  NOT DETECTED Final   Shiga like toxin producing E coli * 05/03/2024 NOT DETECTED  NOT DETECTED Final   Shigella/Enteroinvasive E coli (EI* 05/03/2024 NOT DETECTED  NOT DETECTED Final   Cryptosporidium 05/03/2024 NOT DETECTED  NOT DETECTED Final   Cyclospora cayetanensis 05/03/2024 NOT DETECTED  NOT DETECTED Final   Entamoeba histolytica 05/03/2024 NOT DETECTED  NOT DETECTED Final   Giardia lamblia 05/03/2024 NOT DETECTED  NOT DETECTED Final   Adenovirus F40/41 05/03/2024 NOT DETECTED  NOT DETECTED Final   Astrovirus 05/03/2024 NOT DETECTED  NOT DETECTED Final   Norovirus GI/GII 05/03/2024 DETECTED (A)  NOT DETECTED Final   Comment: RESULT CALLED TO, READ BACK BY AND VERIFIED WITH: JON SAUNDERS RN [id number] 05/04/24 HNM  Rotavirus A 05/03/2024 NOT DETECTED  NOT DETECTED Final   Sapovirus (I, II, IV, and V) 05/03/2024 NOT DETECTED  NOT DETECTED Final   Performed at [facility], [address] [place], [place] [address]   Sodium 05/04/2024 140  135 - 145 mmol/L Final   Potassium  05/04/2024 4.5  3.5 - 5.1 mmol/L Final   Chloride 05/04/2024 111  98 - 111 mmol/L Final   CO2 05/04/2024 26  22 - 32 mmol/L Final   Glucose, Bld 05/04/2024 83  70 - 99 mg/dL Final   Glucose reference range applies only to samples taken after fasting for at least 8 hours.   BUN 05/04/2024 30 (H)  6 - 20 mg/dL Final   Creatinine, Ser 05/04/2024 1.32 (H)  0.61 - 1.24 mg/dL Final   Calcium  05/04/2024 8.1 (L)  8.9 - 10.3 mg/dL Final   GFR, Estimated 05/04/2024 >60  >60 mL/min Final   Comment: (NOTE) Calculated using the CKD-EPI Creatinine Equation (2021)    Anion gap 05/04/2024 3 (L)  5 - 15 Final   Performed at [facility], [address] [place], [place] [address]   Tricyclic, Ur Screen 05/03/2024 NONE DETECTED  NONE DETECTED Final   Amphetamines, Ur Screen 05/03/2024 NONE DETECTED  NONE DETECTED Final   MDMA (Ecstasy)Ur Screen 05/03/2024 NONE DETECTED  NONE DETECTED Final   Cocaine Metabolite,Ur [place] 05/03/2024 POSITIVE (A)  NONE DETECTED Final   Opiate, Ur Screen 05/03/2024 POSITIVE (A)  NONE DETECTED Final   Phencyclidine (PCP) Ur S 05/03/2024 NONE DETECTED  NONE DETECTED Final   Cannabinoid 50 Ng, Ur [place] 05/03/2024 NONE DETECTED  NONE DETECTED Final   Barbiturates, Ur Screen 05/03/2024 NONE DETECTED  NONE DETECTED Final   Benzodiazepine, Ur Scrn 05/03/2024 POSITIVE (A)  NONE DETECTED Final   Methadone Scn, Ur 05/03/2024 NONE DETECTED  NONE DETECTED Final   Comment: (NOTE) Tricyclics + metabolites, urine    Cutoff 1000 ng/mL Amphetamines + metabolites, urine  Cutoff 1000 ng/mL MDMA (Ecstasy), urine              Cutoff 500 ng/mL Cocaine Metabolite, urine          Cutoff 300 ng/mL Opiate + metabolites, urine        Cutoff 300 ng/mL Phencyclidine (PCP), urine         Cutoff 25 ng/mL Cannabinoid, urine                 Cutoff 50 ng/mL Barbiturates + metabolites, urine  Cutoff 200 ng/mL Benzodiazepine, urine              Cutoff 200 ng/mL Methadone, urine                   Cutoff  300 ng/mL  The urine drug screen provides only a preliminary, unconfirmed analytical test result and should not be used for non-medical purposes. Clinical consideration and professional judgment should be applied to any positive drug screen result due to possible interfering substances. A more specific alternate chemical method must be used in order to obtain a confirmed analytical result. Gas chromatography / mass spectrometry (GC/MS) is the preferred confirm                          atory method. Performed at [facility], [address] [place], [place] [address]   Admission on 04/11/2024, Discharged on 04/11/2024  Component Date Value Ref Range Status   Lipase 04/11/2024  36  11 - 51 U/L Final   Performed at [facility], [address] [place], [place] [address]   Sodium 04/11/2024 136  135 - 145 mmol/L Final   Potassium 04/11/2024 4.2  3.5 - 5.1 mmol/L Final   Chloride 04/11/2024 107  98 - 111 mmol/L Final   CO2 04/11/2024 23  22 - 32 mmol/L Final   Glucose, Bld 04/11/2024 94  70 - 99 mg/dL Final   Glucose reference range applies only to samples taken after fasting for at least 8 hours.   BUN 04/11/2024 27 (H)  6 - 20 mg/dL Final   Creatinine, Ser 04/11/2024 1.47 (H)  0.61 - 1.24 mg/dL Final   Calcium  04/11/2024 8.7 (L)  8.9 - 10.3 mg/dL Final   Total Protein 95/73/7974 6.5  6.5 - 8.1 g/dL Final   Albumin 95/73/7974 3.5  3.5 - 5.0 g/dL Final   AST 95/73/7974 21  15 - 41 U/L Final   ALT 04/11/2024 17  0 - 44 U/L Final   Alkaline Phosphatase 04/11/2024 67  38 - 126 U/L Final   Total Bilirubin 04/11/2024 0.2  0.0 - 1.2 mg/dL Final   GFR, Estimated 04/11/2024 55 (L)  >60 mL/min Final   Comment: (NOTE) Calculated using the CKD-EPI Creatinine Equation (2021)    Anion gap 04/11/2024 6  5 - 15 Final   Performed at [facility], [address] [place], [place] [address]   WBC 04/11/2024 3.4 (L)  4.0 - 10.5 K/uL Final   RBC 04/11/2024 4.02 (L)  4.22  - 5.81 MIL/uL Final   Hemoglobin 04/11/2024 13.7  13.0 - 17.0 g/dL Final   HCT 95/73/7974 40.2  39.0 - 52.0 % Final   MCV 04/11/2024 100.0  80.0 - 100.0 fL Final   MCH 04/11/2024 34.1 (H)  26.0 - 34.0 pg Final   MCHC 04/11/2024 34.1  30.0 - 36.0 g/dL Final   RDW 95/73/7974 12.4  11.5 - 15.5 % Final   Platelets 04/11/2024 216  150 - 400 K/uL Final   nRBC 04/11/2024 0.0  0.0 - 0.2 % Final   Performed at [facility], [address] [place], [place] [address]  Hospital Outpatient Visit on 01/14/2024  Component Date Value Ref Range Status   Hgb A1c MFr Bld 01/14/2024 5.8 (H)  4.8 - 5.6 % Final   Comment: (NOTE) Pre diabetes:          5.7%-6.4%  Diabetes:              >6.4%  Glycemic control for   <7.0% adults with diabetes    Mean Plasma Glucose 01/14/2024 119.76  mg/dL Final   Performed at [facility], [address] [place], [place] [address]   Sodium 01/14/2024 139  135 - 145 mmol/L Final   Potassium 01/14/2024 4.2  3.5 - 5.1 mmol/L Final   Chloride 01/14/2024 108  98 - 111 mmol/L Final   CO2 01/14/2024 21 (L)  22 - 32 mmol/L Final   Glucose, Bld 01/14/2024 99  70 - 99 mg/dL Final   Glucose reference range applies only to samples taken after fasting for at least 8 hours.   BUN 01/14/2024 27 (H)  6 - 20 mg/dL Final   Creatinine, Ser 01/14/2024 1.15  0.61 - 1.24 mg/dL Final   Calcium  01/14/2024 9.0  8.9 - 10.3 mg/dL Final   Total Protein 98/71/7974 6.7  6.5 - 8.1 g/dL Final   Albumin 98/71/7974 3.8  3.5 -  5.0 g/dL Final   AST 98/71/7974 18  15 - 41 U/L Final   ALT 01/14/2024 15  0 - 44 U/L Final   Alkaline Phosphatase 01/14/2024 68  38 - 126 U/L Final   Total Bilirubin 01/14/2024 0.4  0.0 - 1.2 mg/dL Final   GFR, Estimated 01/14/2024 >60  >60 mL/min Final   Comment: (NOTE) Calculated using the CKD-EPI Creatinine Equation (2021)    Anion gap 01/14/2024 10  5 - 15 Final   Performed at [facility], [address] [place], [place] [address]   WBC  01/14/2024 2.9 (L)  4.0 - 10.5 K/uL Final   RBC 01/14/2024 4.26  4.22 - 5.81 MIL/uL Final   Hemoglobin 01/14/2024 14.1  13.0 - 17.0 g/dL Final   HCT 98/71/7974 41.6  39.0 - 52.0 % Final   MCV 01/14/2024 97.7  80.0 - 100.0 fL Final   MCH 01/14/2024 33.1  26.0 - 34.0 pg Final   MCHC 01/14/2024 33.9  30.0 - 36.0 g/dL Final   RDW 98/71/7974 12.0  11.5 - 15.5 % Final   Platelets 01/14/2024 215  150 - 400 K/uL Final   nRBC 01/14/2024 0.0  0.0 - 0.2 % Final   Neutrophils Relative % 01/14/2024 34  % Final   Neutro Abs 01/14/2024 1.0 (L)  1.7 - 7.7 K/uL Final   Lymphocytes Relative 01/14/2024 54  % Final   Lymphs Abs 01/14/2024 1.5  0.7 - 4.0 K/uL Final   Monocytes Relative 01/14/2024 9  % Final   Monocytes Absolute 01/14/2024 0.3  0.1 - 1.0 K/uL Final   Eosinophils Relative 01/14/2024 2  % Final   Eosinophils Absolute 01/14/2024 0.1  0.0 - 0.5 K/uL Final   Basophils Relative 01/14/2024 1  % Final   Basophils Absolute 01/14/2024 0.0  0.0 - 0.1 K/uL Final   Immature Granulocytes 01/14/2024 0  % Final   Abs Immature Granulocytes 01/14/2024 0.01  0.00 - 0.07 K/uL Final   Performed at [facility], [address] [place], [place] [address]   QuantiFERON Incubation 01/14/2024 Incubation performed.   Final   QuantiFERON-TB Gold Plus 01/14/2024 Negative  Negative Final   Comment: (NOTE) No response to M tuberculosis antigens detected. Infection with M tuberculosis is unlikely, but high risk individuals should be considered for additional testing (ATS/IDSA/CDC Clinical Practice Guidelines, 2017). The reference range is an Antigen minus Nil result of <0.35 IU/mL. Chemiluminescence immunoassay methodology Performed At: [facility] [address] [place], [place] [id number] Jennette Shorter MD Ey:[id number]    RPR Ser Ql 01/14/2024 NON REACTIVE  NON REACTIVE Final   Performed at [facility], [address], [place], [place] [address]   Absolute CD 4 Helper 01/14/2024 555   359 - 1,519 /uL Final   % CD 4 Pos. Lymph. 01/14/2024 34.7  30.8 - 58.5 % Final   CD3+CD8+ Cells # Bld 01/14/2024 835  109 - 897 /uL Final   CD3+CD8+ Cells NFr Bld 01/14/2024 52.2 (H)  12.0 - 35.5 % Final   CD3+CD4+ Cells/CD3+CD8+ Cells Bld 01/14/2024 0.66 (L)  0.92 - 3.72 Final   WBC 01/14/2024 2.9 (L)  3.4 - 10.8 x10E3/uL Final   RBC 01/14/2024 4.22  4.14 - 5.80 x10E6/uL Final   Ovalocytes present.   Hemoglobin 01/14/2024 14.4  13.0 - 17.7 g/dL Final   Hematocrit 98/71/7974 42.6  37.5 - 51.0 % Final   MCV 01/14/2024 101 (H)  79 - 97 fL Final   MCH 01/14/2024 34.1 (  H)  26.6 - 33.0 pg Final   MCHC 01/14/2024 33.8  31.5 - 35.7 g/dL Final   RDW 98/71/7974 11.9  11.6 - 15.4 % Final   Platelets 01/14/2024 220  150 - 450 x10E3/uL Final   Neutrophils 01/14/2024 34  Not Estab. % Final   Lymphs 01/14/2024 53  Not Estab. % Final   Monocytes 01/14/2024 9  Not Estab. % Final   Eos 01/14/2024 3  Not Estab. % Final   Basos 01/14/2024 1  Not Estab. % Final   Neutrophils Absolute 01/14/2024 1.0 (L)  1.4 - 7.0 x10E3/uL Final   Lymphocytes Absolute 01/14/2024 1.6  0.7 - 3.1 x10E3/uL Final   Monocytes Absolute 01/14/2024 0.3  0.1 - 0.9 x10E3/uL Final   EOS (ABSOLUTE) 01/14/2024 0.1  0.0 - 0.4 x10E3/uL Final   Basophils Absolute 01/14/2024 0.0  0.0 - 0.2 x10E3/uL Final   Immature Granulocytes 01/14/2024 0  Not Estab. % Final   Immature Grans (Abs) 01/14/2024 0.0  0.0 - 0.1 x10E3/uL Final   Hematology Comments: 01/14/2024 Note:   Corrected   Comment: (NOTE) Verified by microscopic examination. Performed At: [facility] [address] [place], [place] [id number] Jennette Shorter MD Ey:[id number]    HIV 1 RNA Quant 01/14/2024 40  copies/mL Corrected   Comment: (NOTE) The reportable range for this assay is 20 to 10,000,000 copies HIV-1 RNA/mL.    LOG10 HIV-1 RNA 01/14/2024 1.602  log10copy/mL Final   Comment: (NOTE) Performed At: [facility] [address] [place], [place]  [id number] Jennette Shorter MD Ey:[id number]    QuantiFERON Criteria 01/14/2024 Comment   Final   Comment: (NOTE) QuantiFERON-TB Gold Plus is a qualitative indirect test for M tuberculosis infection (including disease) and is intended for use in conjunction with risk assessment, radiography, and other medical and diagnostic evaluations. The QuantiFERON-TB Gold Plus result is determined by subtracting the Nil value from either TB antigen (Ag) value. The Mitogen tube serves as a control for the test.    QuantiFERON TB1 Ag Value 01/14/2024 0.04  IU/mL Final   QuantiFERON TB2 Ag Value 01/14/2024 0.05  IU/mL Final   QuantiFERON Nil Value 01/14/2024 0.06  IU/mL Final   QuantiFERON Mitogen Value 01/14/2024 >10.00  IU/mL Final   Comment: (NOTE) Performed At: [facility] [address] [place], [place] [id number] Jennette Shorter MD Ey:[id number]     Blood Alcohol level:  Lab Results  Component Value Date   [facility] <15 06/03/2024   ETH <15 05/03/2024    Metabolic Disorder Labs: Lab Results  Component Value Date   HGBA1C 5.8 (H) 01/14/2024   MPG 119.76 01/14/2024   MPG 102.54 05/27/2018   No results found for: PROLACTIN Lab Results  Component Value Date   CHOL 158 01/23/2019   TRIG 63 01/23/2019   HDL 43 01/23/2019   CHOLHDL 3.7 01/23/2019   VLDL 13 01/23/2019   LDLCALC 102 (H) 01/23/2019   LDLCALC 67 05/27/2018    Therapeutic Lab Levels: No results found for: LITHIUM No results found for: VALPROATE No results found for: CBMZ  Physical Findings   AIMS    Flowsheet Row [facility]) [facility] Ad[facility] (Discharged) from 02/20/2017 in [facility]  AIMS Total Score 0 0   AUDIT    Flowsheet Row Admission (Discharged) from 11/01/2020 in [facility] INPATIENT BEH[facility]AL MEDICINE Admission (Discharged) from 10/18/2020 in [facility]PATIENT BEHAV[facility]AL MEDICINE Admission (Discharged) from  05/18/2020 in [facility] INPATIENT BE[facility]AV[facility]AL MEDICINE Admission (Discharged) [facility]  [facility] Admission (Discharged) from 02/19/2019 in [facility] INPATIENT BEHAVIORAL MEDICINE  Alcohol Use Disorder Identification Test Final Score (AUDIT) 4 0 14 0 0   PHQ2-9    Flowsheet Row ED from 06/03/2024 in [facility] Office Visit from 01/14/2024 in [facility] Office Visit from 04/02/2023 in [facility] Office Visit from 12/20/2022 in [facility]  PHQ-2 Total Score 2 1 1 1   PHQ-9 Total Score 11 -- -- --   Flowsheet Row ED [facility] 06/03/2024 [facility] Most recent reading at 06/03/2024  5:42 PM ED from 06/03/2024 in [facility] Most recent reading at 06/03/2024  1:56 PM ED to Hosp-Admission (Discharged) from 05/03/2024 in [facility] 1C MEDICAL TELEMETRY Most recent reading at 05/03/2024  9:58 AM  C-SSRS RISK CATEGORY No Risk No Risk No Risk    Psychiatric Specialty Exam  Presentation  General Appearance:  Appropriate for Environment; Casual  Eye Contact: Good  Speech: Clear and Coherent; Normal Rate  Speech Volume: Normal  Mood and Affect  Mood: Euthymic  Affect: Appropriate; Congruent  Thought Process  Thought Processes: Coherent; Goal Directed; Linear  Descriptions of Associations:Intact  Orientation:Full (Time, Place and Person)  Thought Content:Logical  Diagnosis of Schizophrenia or Schizoaffective disorder in past: No data recorded   Hallucinations:No data recorded  Ideas of Reference:None  Suicidal Thoughts:No data recorded  Homicidal Thoughts:No data recorded  Sensorium  Memory: Immediate Good  Judgment: Fair  Insight: Fair  Art therapist  Concentration: Good  Attention Span: Good  Recall: Fair  Fund of Knowledge: Fair  Language: Fair  Psychomotor  Activity  Psychomotor Activity: No data recorded  Assets  Assets: Communication Skills; Desire for Improvement; Financial Resources/Insurance; Housing; Resilience; Social Support   Sleep  Sleep: better   Physical Exam  Physical Exam Constitutional:      Appearance: Normal appearance.   Eyes:     General:        Right eye: No discharge.        Left eye: No discharge.    Cardiovascular:     Rate and Rhythm: Normal rate.  Pulmonary:     Effort: No respiratory distress.   Musculoskeletal:        General: Normal range of motion.   Neurological:     Mental Status: He is alert and oriented to person, place, and time.   Psychiatric:        Attention and Perception: Attention and perception normal.        Mood and Affect: Mood and affect normal. Mood is not anxious or depressed.        Speech: Speech normal.        Behavior: Behavior normal. Behavior is cooperative.        Thought Content: Thought content normal.        Cognition and Memory: Cognition normal.        Judgment: Judgment normal.    Review of Systems  Constitutional:  Negative for fever.  HENT:  Negative for hearing loss.   Respiratory:  Negative for cough and shortness of breath.   Cardiovascular:  Negative for chest pain.  Musculoskeletal: Negative.   Neurological:  Negative for tremors.  Psychiatric/Behavioral: Negative.     Blood pressure (!) 154/97, pulse (!) 42, temperature 98.4 F (36.9 C), temperature source Oral, resp. rate 18, SpO2 100%. There is no height or  weight on file to calculate BMI.  Medical Decision Making  Jianni Batten is a 58 y.o. male with a past psychiatric history of polysubstance abuse, alcohol abuse, cocaine abuse, tobacco use  who presented to [facility] for detox.  [facility] for detox prior to placement at treatment facility.   Patient is currently denying any withdrawal symptoms.  He continues to  plan for discharge tomorrow  06/08/2024.  He is well-connected with ACT T services and will follow-up with them upon discharge.  He continues to be interested in a CD IOP program.  He will speak with social worker in the a.m. before discharge to see available resources in his area.   Recommendations  Refer to [facility] for detailed medication list  Polysubstance Abuse  Alcohol Use Disorder  Cocaine Use Disorder  Opioid Use Disorder Tobacco Use   - CIWA discontinued 6/21 due to limited symptoms consistent with CIWA=0 - SW following, to consider goals of treatment and resources including plan for SAIOP and return to outpt ACTT within 48-72hrs -Thiamine  100 mg daily for nutritional supplementation - Multivitamin daily with nutrients  - Ensure TID with meals PRN -ondansetron  4 mg ODT every 6 hours prn nausea/vomiting -loperamide  2-4 mg capsule prn diarrhea or loose stools    HIV  -Continue home Biktarvy    Dyslipidemia  -Continue home rosuvastatin    Hypertension  -Continue home losartan   Elveria VEAR Batter, NP 06/07/2024 1:15 PM

## 2024-06-07 NOTE — ED Notes (Signed)
 Patient is awake and alert on unit.  Calm and pleasant without complaint or distress.  Will monitor.

## 2024-06-07 NOTE — ED Notes (Signed)
 Pt is in the dayroom watching TV with peers. Pt denies SI/HI/AVH. Pt has no further complain.No acute distress noted.

## 2024-06-07 NOTE — ED Notes (Signed)
 Environmental check completed, no contraband found in bedroom.

## 2024-06-07 NOTE — Group Note (Signed)
 Group Topic: Understanding Self  Group Date: 06/07/2024 Start Time: 2015 End Time: 2045 Facilitators: Lenon Lenice SAUNDERS, NT  Department: [facility]  Number of Participants: 6  Group Focus: coping skills Treatment Modality:  Individual Therapy Interventions utilized were group exercise Purpose: express feelings  Name: Roy [name] Date of Birth: April 10, 1966  MR: [id number]    Level of Participation: pt did not attend group Quality of Participation: pt did not attend group Interactions with others: pt did not attend group Mood/Affect: pt did not attend group Triggers (if applicable):  Cognition: pt did not attend group Progress: pt did not attend group Response: pt did not attend group Plan: pt did not attend group  Patients Problems:  Patient Active Problem List   Diagnosis Date Noted   Polysubstance dependence (HCC) 06/03/2024   Generalized abdominal pain 05/04/2024   Gastroenteritis 05/04/2024   Impaired ambulation 05/03/2024   AKI (acute kidney injury) (HCC) 05/03/2024   Suicidal thoughts 06/16/2023   Cocaine abuse with cocaine-induced mood disorder (HCC) 11/13/2020   Allergic rhinitis 06/21/2020   GERD (gastroesophageal reflux disease) 06/21/2020   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   Arthritis pain, hip 05/18/2020   Status post total hip replacement, 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

## 2024-06-07 NOTE — Group Note (Signed)
 Group Topic: Relapse and Recovery  Group Date: 06/07/2024 Start Time: 1215 End Time: 1230 Facilitators: Stanly Stabile, RN  Department: [facility]  Number of Participants: 8  Group Focus: chemical dependency education and chemical dependency issues Treatment Modality:  Behavior Modification Therapy Interventions utilized were clarification, patient education, and problem solving Purpose: express feelings, express irrational fears, improve communication skills, and increase insight  Name: Roy [name] Date of Birth: 11-17-1966  MR: [id number]    Level of Participation: active Quality of Participation: cooperative Interactions with others: gave feedback Mood/Affect: appropriate Triggers (if applicable):   Cognition: coherent/clear Progress: Gaining insight Response:   Plan: follow-up needed  Patients Problems:  Patient Active Problem List   Diagnosis Date Noted   Polysubstance dependence (HCC) 06/03/2024   Generalized abdominal pain 05/04/2024   Gastroenteritis 05/04/2024   Impaired ambulation 05/03/2024   AKI (acute kidney injury) (HCC) 05/03/2024   Suicidal thoughts 06/16/2023   Cocaine abuse with cocaine-induced mood disorder (HCC) 11/13/2020   Allergic rhinitis 06/21/2020   GERD (gastroesophageal reflux disease) 06/21/2020   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   Arthritis pain, hip 05/18/2020   Status post total hip replacement, 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

## 2024-06-08 DIAGNOSIS — E785 Hyperlipidemia, unspecified: Secondary | ICD-10-CM | POA: Diagnosis not present

## 2024-06-08 DIAGNOSIS — Z809 Family history of malignant neoplasm, unspecified: Secondary | ICD-10-CM | POA: Diagnosis not present

## 2024-06-08 DIAGNOSIS — I10 Essential (primary) hypertension: Secondary | ICD-10-CM | POA: Diagnosis not present

## 2024-06-08 DIAGNOSIS — Z8249 Family history of ischemic heart disease and other diseases of the circulatory system: Secondary | ICD-10-CM | POA: Diagnosis not present

## 2024-06-08 MED ORDER — NALTREXONE HCL 50 MG PO TABS: 50.0000 mg | ORAL_TABLET | Freq: Every day | ORAL | 0 refills | Status: AC

## 2024-06-08 MED ORDER — TRAZODONE HCL 100 MG PO TABS: 100.0000 mg | ORAL_TABLET | Freq: Every evening | ORAL | 0 refills | Status: AC | PRN

## 2024-06-08 NOTE — ED Notes (Signed)
 Stable. A&O x 4.  Discharging to home.   Denies current SI plan and Intent.  Denies HI and A/V hallucinations.   All belongings, belongings and medications returned patient  Discharge and F/u instructions review.  Pt verbalized understanding

## 2024-06-08 NOTE — ED Notes (Signed)
 Patient is sleeping. Respirations equal and unlabored. No change in assessment or acuity. Routine safety checks conducted according to facility protocol.

## 2024-06-08 NOTE — Discharge Planning (Signed)
 Per MD, patient is stable for DC home today. He will be following up with his ACT team and SW assisted with arrangements of outpatient SA services with [facility] in [place] [facility]. Appointment is scheduled for Tuesday 6/24 @ 10:00 AM. Instructions are provided in patient AVS.

## 2024-06-08 NOTE — Discharge Planning (Incomplete Revision)
 Per MD, patient is stable for DC home today. He will be following up with his ACT team and SW assisted with arrangements of outpatient SA services with [facility] in in [place]. Patient was referred to [facility] program. Appointment is scheduled for Tuesday 6/24 @ 10:00 AM. Instructions are provided in patient AVS.

## 2024-06-08 NOTE — ED Notes (Signed)
 Patient is sleeping. Respirations equal and unlabored, skin warm and dry, NAD. No change in assessment or acuity. Routine safety checks conducted according to facility protocol.

## 2024-06-08 NOTE — Discharge Instructions (Addendum)
 [facility]  [address] [place], [place] [address] Phone: [phone] Hours:  Open 24 hours  You are scheduled for a follow up appointment for referral for SAIOP services and discussed. The appointment time is scheduled for Wednesday 6/25th @ 11:00. Please bring your photo ID and insurance card.   [place] AA/NA resources printed and provided to patient personally.

## 2024-06-08 NOTE — ED Notes (Signed)
 Pt has been calm and cooperative.  Pt states he is ready for discharge today.  Denied current SI plan and intent.  Denied HI and A/V hallucinations

## 2024-06-08 NOTE — ED Provider Notes (Signed)
 FBC/OBS ASAP Discharge Summary  Date and Time: 06/08/2024 1:22 PM  Name: Roy [name]  MRN:  [id number]   Discharge Diagnoses:  Final diagnoses:  HIV disease (HCC)  Hypertension, unspecified type  Dyslipidemia  Alcohol use disorder, severe, dependence (HCC)  Cocaine abuse with cocaine-induced mood disorder (HCC)  Tobacco use   Subjective: Patient is seen today in treatment team room. He reports no issues with sleep or appetite. He reports that his mood is good. He denies SI/HI/AVH. He verbalizes readiness for discharge, states that he will connect back with his ACT team and his peer support specialist. He reports that he would like to go to rehab but he has to be back at his apartment to pay rent on the first of the month. He is future oriented and is able to state safety plan of coming back to emergency department or [facility] if he has thoughts of hurting himself or others.   Stay Summary:  During the patient's hospitalization, patient had extensive initial psychiatric evaluation, and follow-up psychiatric evaluations every day.  Psychiatric diagnoses provided upon initial assessment:  -Alcohol use disorder -Cocaine use disorder -Tobacco use   [name] psychiatric medications were adjusted on admission:  -Started on CIWA and librium  PRN -Discontinued nicotine  patch per patient request  -Patient previously discontinued his mirtazapine  and Risperdal  all roughly a year ago due to lack of need for symptomatic control.   During the hospitalization, other adjustments were made to the patient's psychiatric medication regimen:  -started naltrexone  for alcohol use disorder  [name] care was discussed during the interdisciplinary team meeting every day during the hospitalization.  The patient denied having side effects to prescribed psychiatric medication.  Gradually, patient started adjusting to milieu. The patient was evaluated each day by a clinical provider to ascertain response to  treatment. Improvement was noted by the patient's report of decreasing symptoms, improved sleep and appetite, affect, medication tolerance, behavior, and participation in unit programming.  Patient was asked each day to complete a self inventory noting mood, mental status, pain, new symptoms, anxiety and concerns.    Symptoms were reported as significantly decreased or resolved completely by discharge.   On day of discharge, the patient reports that their mood is stable. The patient denied having suicidal thoughts for more than 48 hours prior to discharge.  Patient denies having homicidal thoughts.  Patient denies having auditory hallucinations.  Patient denies any visual hallucinations or other symptoms of psychosis. The patient was motivated to continue taking medication with a goal of continued improvement in mental health.   The patient reports their target psychiatric symptoms of substance detox responded well to the psychiatric medications, and the patient reports overall benefit other psychiatric hospitalization. Supportive psychotherapy was provided to the patient. The patient also participated in regular group therapy while hospitalized. Coping skills, problem solving as well as relaxation therapies were also part of the unit programming.  Labs were reviewed with the patient, and abnormal results were discussed with the patient.  The patient is able to verbalize their individual safety plan to this provider.  # It is recommended to the patient to continue psychiatric medications as prescribed, after discharge from the hospital.    # It is recommended to the patient to follow up with your outpatient psychiatric provider and PCP.  # It was discussed with the patient, the impact of alcohol, drugs, tobacco have been there overall psychiatric and medical wellbeing, and total abstinence from substance use was recommended the patient.ed.  # Prescriptions  provided or sent directly to preferred  pharmacy at discharge. Patient agreeable to plan. Given opportunity to ask questions. Appears to feel comfortable with discharge.    # In the event of worsening symptoms, the patient is instructed to call the crisis hotline, 911 and or go to the nearest ED for appropriate evaluation and treatment of symptoms. To follow-up with primary care provider for other medical issues, concerns and or health care needs  # Patient was discharged home with a plan to follow up with intensive outpatient services as noted below.  Past Psychiatric History: Polysubstance abuse, self-reported MDD and bipolar depression.  Has been off mirtazapine  and Risperdal  for 1 year, due to lack of need for symptomatic control.  Patient has previously gone to [facility] [place] for substance treatment.  Patient has an ACT team.   Past Medical History: HIV, hypertension, dyslipidemia, arthritis per patient report Family History: Per EMR mother with coronary artery disease, hyperlipidemia, uterine cancer and hypertension.  Brother with cancer Father with ethanol abuse, son 1 with crack cocaine use, son 2 with crack cocaine, marijuana and ethanol abuse Social History: Patient currently lives in [place] .  He lives alone in an apartment.  He has a significant other for social support.  Patient currently on disability  Tobacco Cessation:  A prescription for an FDA-approved tobacco cessation medication was offered at discharge and the patient refused  Current Medications:  Current Facility-Administered Medications  Medication Dose Route Frequency Provider Last Rate Last Admin   acetaminophen  (TYLENOL ) tablet 650 mg  650 mg Oral Q6H PRN Bethea, Terrence C, MD   650 mg at 06/06/24 2001   alum & mag hydroxide-simeth (MAALOX/MYLANTA) 200-200-20 MG/5ML suspension 30 mL  30 mL Oral Q4H PRN Cole Kandi BROCKS, MD       bictegravir-emtricitabine -tenofovir  AF (BIKTARVY ) 50-200-25 MG per tablet 1 tablet  1 tablet Oral  Daily Lenard Calin, MD   1 tablet at 06/08/24 0836   haloperidol (HALDOL) tablet 5 mg  5 mg Oral TID PRN Cole Kandi BROCKS, MD       And   diphenhydrAMINE  (BENADRYL ) capsule 50 mg  50 mg Oral TID PRN Bethea, Terrence C, MD       haloperidol lactate (HALDOL) injection 5 mg  5 mg Intramuscular TID PRN Cole Kandi BROCKS, MD       And   diphenhydrAMINE  (BENADRYL ) injection 50 mg  50 mg Intramuscular TID PRN Cole Kandi BROCKS, MD       haloperidol lactate (HALDOL) injection 10 mg  10 mg Intramuscular TID PRN Cole Kandi BROCKS, MD       And   diphenhydrAMINE  (BENADRYL ) injection 50 mg  50 mg Intramuscular TID PRN Cole Kandi BROCKS, MD       And   LORazepam  (ATIVAN ) injection 2 mg  2 mg Intramuscular TID PRN Cole Kandi BROCKS, MD       feeding supplement (ENSURE ENLIVE / ENSURE PLUS) liquid 296 mL  296 mL Oral TID with meals Lenard Calin, MD   296 mL at 06/08/24 1203   hydrOXYzine  (ATARAX ) tablet 25 mg  25 mg Oral TID PRN Bethea, Terrence C, MD   25 mg at 06/06/24 2113   loperamide  (IMODIUM ) capsule 2 mg  2 mg Oral PRN Rainey, Donovan, MD       loratadine  (CLARITIN ) tablet 10 mg  10 mg Oral QHS Lenard Calin, MD   10 mg at 06/07/24 2109   losartan  (COZAAR ) tablet 100 mg  100  mg Oral q AM Lenard Calin, MD   100 mg at 06/08/24 0836   magnesium  hydroxide (MILK OF MAGNESIA) suspension 30 mL  30 mL Oral Daily PRN Bethea, Terrence C, MD       multivitamin with minerals tablet 1 tablet  1 tablet Oral Daily Lenard Calin, MD   1 tablet at 06/08/24 0837   naltrexone  (DEPADE) tablet 50 mg  50 mg Oral Daily Bethea, Terrence C, MD   50 mg at 06/08/24 9162   ondansetron  (ZOFRAN -ODT) disintegrating tablet 4 mg  4 mg Oral Q8H PRN Lenard Calin, MD   4 mg at 06/05/24 1639   rosuvastatin  (CRESTOR ) tablet 20 mg  20 mg Oral Daily Lenard Calin, MD   20 mg at 06/08/24 9163   traZODone  (DESYREL ) tablet 100 mg  100 mg Oral QHS PRN Lenard Calin, MD   100 mg at 06/07/24 2109   Current Outpatient  Medications  Medication Sig Dispense Refill   albuterol  (VENTOLIN  HFA) 108 (90 Base) MCG/ACT inhaler Inhale 1-2 puffs into the lungs every 4 (four) hours as needed for wheezing or shortness of breath. 18 g 1   BIKTARVY  50-200-25 MG TABS tablet Take 1 tablet by mouth every morning. 30 tablet 6   cetirizine (ZYRTEC) 10 MG tablet Take 1 tablet by mouth daily.     loperamide  (IMODIUM ) 2 MG capsule Take 1 capsule (2 mg total) by mouth as needed for diarrhea or loose stools. (Patient not taking: Reported on 06/03/2024) 30 capsule 0   losartan  (COZAAR ) 100 MG tablet Take 1 tablet (100 mg total) by mouth every morning. Hold for next 3 days     [Paused] mirtazapine  (REMERON ) 7.5 MG tablet Take 1 tablet (7.5 mg total) by mouth at bedtime. (Patient not taking: Reported on 05/03/2024) 30 tablet 1   [START ON 06/09/2024] naltrexone  (DEPADE) 50 MG tablet Take 1 tablet (50 mg total) by mouth daily. 30 tablet 0   ondansetron  (ZOFRAN -ODT) 4 MG disintegrating tablet Take 1 tablet (4 mg total) by mouth every 8 (eight) hours as needed for nausea or vomiting. (Patient not taking: Reported on 06/03/2024) 20 tablet 0   [Paused] risperiDONE  (RISPERDAL ) 1 MG tablet Take 1 tablet (1 mg total) by mouth at bedtime. (Patient not taking: Reported on 05/03/2024) 30 tablet 1   rosuvastatin  (CRESTOR ) 20 MG tablet Take 20 mg by mouth daily.     traZODone  (DESYREL ) 100 MG tablet Take 1 tablet (100 mg total) by mouth at bedtime as needed for sleep. 30 tablet 0    PTA Medications:  Facility Ordered Medications  Medication   acetaminophen  (TYLENOL ) tablet 650 mg   alum & mag hydroxide-simeth (MAALOX/MYLANTA) 200-200-20 MG/5ML suspension 30 mL   magnesium  hydroxide (MILK OF MAGNESIA) suspension 30 mL   haloperidol (HALDOL) tablet 5 mg   And   diphenhydrAMINE  (BENADRYL ) capsule 50 mg   haloperidol lactate (HALDOL) injection 5 mg   And   diphenhydrAMINE  (BENADRYL ) injection 50 mg   haloperidol lactate (HALDOL) injection 10 mg   And    diphenhydrAMINE  (BENADRYL ) injection 50 mg   And   LORazepam  (ATIVAN ) injection 2 mg   hydrOXYzine  (ATARAX ) tablet 25 mg   multivitamin with minerals tablet 1 tablet   [COMPLETED] thiamine  (VITAMIN B1) injection 100 mg   [EXPIRED] chlordiazePOXIDE  (LIBRIUM ) capsule 25 mg   loperamide  (IMODIUM ) capsule 2 mg   ondansetron  (ZOFRAN -ODT) disintegrating tablet 4 mg   rosuvastatin  (CRESTOR ) tablet 20 mg   bictegravir-emtricitabine -tenofovir  AF (BIKTARVY ) 50-200-25 MG per tablet 1  tablet   feeding supplement (ENSURE ENLIVE / ENSURE PLUS) liquid 296 mL   traZODone  (DESYREL ) tablet 100 mg   loratadine  (CLARITIN ) tablet 10 mg   naltrexone  (DEPADE) tablet 50 mg   losartan  (COZAAR ) tablet 100 mg   PTA Medications  Medication Sig   albuterol  (VENTOLIN  HFA) 108 (90 Base) MCG/ACT inhaler Inhale 1-2 puffs into the lungs every 4 (four) hours as needed for wheezing or shortness of breath.   [Paused] mirtazapine  (REMERON ) 7.5 MG tablet Take 1 tablet (7.5 mg total) by mouth at bedtime. (Patient not taking: Reported on 05/03/2024)   [Paused] risperiDONE  (RISPERDAL ) 1 MG tablet Take 1 tablet (1 mg total) by mouth at bedtime. (Patient not taking: Reported on 05/03/2024)   rosuvastatin  (CRESTOR ) 20 MG tablet Take 20 mg by mouth daily.   BIKTARVY  50-200-25 MG TABS tablet Take 1 tablet by mouth every morning.   losartan  (COZAAR ) 100 MG tablet Take 1 tablet (100 mg total) by mouth every morning. Hold for next 3 days   loperamide  (IMODIUM ) 2 MG capsule Take 1 capsule (2 mg total) by mouth as needed for diarrhea or loose stools. (Patient not taking: Reported on 06/03/2024)   ondansetron  (ZOFRAN -ODT) 4 MG disintegrating tablet Take 1 tablet (4 mg total) by mouth every 8 (eight) hours as needed for nausea or vomiting. (Patient not taking: Reported on 06/03/2024)   traZODone  (DESYREL ) 100 MG tablet Take 1 tablet (100 mg total) by mouth at bedtime as needed for sleep.   [START ON 06/09/2024] naltrexone  (DEPADE) 50 MG tablet  Take 1 tablet (50 mg total) by mouth daily.       06/08/2024   10:20 AM 06/07/2024   11:30 AM 06/04/2024    9:09 AM  Depression screen PHQ 2/9  Decreased Interest 0 1 1  Down, Depressed, Hopeless 0 1 1  PHQ - 2 Score 0 2 2  Altered sleeping 0 1 3  Tired, decreased energy 0 2 3  Change in appetite 0 2 3  Feeling bad or failure about yourself  0 2 2  Trouble concentrating 0 2 3  Moving slowly or fidgety/restless 0 0 0  Suicidal thoughts 0 0 0  PHQ-9 Score 0 11 16  Difficult doing work/chores  Somewhat difficult     Flowsheet Row ED from 06/03/2024 in [facility] Most recent reading at 06/03/2024  5:42 PM ED from 06/03/2024 in [facility] Emergency Department at [facility] Most recent reading at 06/03/2024  1:56 PM ED to Hosp-Admission (Discharged) from 05/03/2024 in [facility] 1C MEDICAL TELEMETRY Most recent reading at 05/03/2024  9:58 AM  C-SSRS RISK CATEGORY No Risk No Risk No Risk    Musculoskeletal  Strength & Muscle Tone: within normal limits Gait & Station: normal Patient leans: N/A  Psychiatric Specialty Exam  Presentation  General Appearance:  Appropriate for Environment; Casual  Eye Contact: Good  Speech: Clear and Coherent; Normal Rate  Speech Volume: Normal  Handedness: Right   Mood and Affect  Mood: Feeling good   Affect: Appropriate; Congruent   Thought Process  Thought Processes: Coherent; Goal Directed; Linear  Descriptions of Associations:Intact  Orientation:Full (Time, Place and Person)  Thought Content:Logical  Diagnosis of Schizophrenia or Schizoaffective disorder in past: None  Hallucinations: None Ideas of Reference:None  Suicidal Thoughts: None Homicidal Thoughts: None  Sensorium  Memory: Immediate Good  Judgment: Fair  Insight: Fair   Executive Functions  Concentration: Good  Attention Span: Good  Recall: [employer]  of  Knowledge: Fair  Language: Fair   Psychomotor Activity  Psychomotor Activity: Normal  Assets  Assets: Communication Skills; Desire for Improvement; Financial Resources/Insurance; Housing; Resilience; Social Support   Sleep  Sleep: Good  Physical Exam  Physical Exam ROS Physical Exam Constitutional:      Appearance: the patient is not toxic-appearing.  Pulmonary:     Effort: Pulmonary effort is normal.  Neurological:     General: No focal deficit present.     Mental Status: the patient is alert and oriented to person, place, and time.   Review of Systems  Respiratory:  Negative for shortness of breath.   Cardiovascular:  Negative for chest pain.  Gastrointestinal:  Negative for abdominal pain, constipation, diarrhea, nausea and vomiting.  Neurological:  Negative for headaches.   Blood pressure (!) 155/85, pulse (!) 56, temperature 98.5 F (36.9 C), temperature source Oral, resp. rate 18, SpO2 100%. There is no height or weight on file to calculate BMI.  Demographic Factors:  Male, Low socioeconomic status, and Living alone  Loss Factors: Legal issues  Historical Factors: Prior suicide attempts and Impulsivity  Risk Reduction Factors:   Positive social support  Continued Clinical Symptoms:  Alcohol/Substance Abuse/Dependencies Previous Psychiatric Diagnoses and Treatments  Cognitive Features That Contribute To Risk:  Thought constriction (tunnel vision)    Suicide Risk:  Mild:  There are no identifiable plans, no associated intent, mild dysphoria and related symptoms, good self-control (both objective and subjective assessment), few other risk factors, and identifiable protective factors, including available and accessible social support.  Plan Of Care/Follow-up recommendations:  Activity: as tolerated  Diet: heart healthy  Other: -Follow-up with your outpatient psychiatric provider -instructions on appointment date, time, and address (location) are  provided to you in discharge paperwork.  -Take your psychiatric medications as prescribed at discharge - instructions are provided to you in the discharge paperwork  -Follow-up with outpatient primary care doctor and other specialists -for management of preventative medicine and chronic medical disease: HIV, dyslipidemia, HTN  -Testing: Follow-up with outpatient provider for abnormal lab results:  Cr 1.40 UDS+cocaine  -If you are prescribed an atypical antipsychotic medication, we recommend that your outpatient psychiatrist follow routine screening for side effects within 3 months of discharge, including monitoring: AIMS scale, height, weight, blood pressure, fasting lipid panel, HbA1c, and fasting blood sugar.   -Recommend total abstinence from alcohol, tobacco, and other illicit drug use at discharge.   -If your psychiatric symptoms recur, worsen, or if you have side effects to your psychiatric medications, call your outpatient psychiatric provider, 911, [phone] or go to the nearest emergency department.  -If suicidal thoughts occur, immediately call your outpatient psychiatric provider, 911, [phone] or go to the nearest emergency department.  Disposition: Home with intensive outpatient resources and ACT team  Proctor Carriker, MD, PGY-2 06/08/2024, 1:22 PM  This case was discussed with attending Dr. Leigh who agrees with the above formulated treatment plan. Please see attending attestation for additional details.   This note was created using a voice recognition software as a result there may be grammatical errors inadvertently enclosed that do not reflect the nature of this encounter. Every attempt is made to correct such errors.

## 2024-06-08 NOTE — Group Note (Signed)
 Group Topic: Communication  Group Date: 06/08/2024 Start Time: 1000 End Time: 1030 Facilitators: Carletha Iha, RN  Department: [facility]  Number of Participants: 8  Group Focus: nursing group Treatment Modality:  Psychoeducation Interventions utilized were patient education Purpose: Education on importance of continuing medications after discharge.   Name: Roy [name] Date of Birth: 1966-08-21  MR: [id number]    Level of Participation: active Quality of Participation: attentive Interactions with others: gave feedback Mood/Affect: appropriate Triggers (if applicable):  Cognition: coherent/clear Progress: Gaining insight Response:  Plan: patient will be encouraged to  active Quality of Participation: quiet Interactions with others:  minimal Mood/Affect: appropriate Triggers (if applicable):  Cognition: insightful Progress: Gaining insight Response:  Plan: patient will be encouraged to Continue medications after discharge.  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

## 2024-07-14 ENCOUNTER — Ambulatory Visit: Payer: MEDICAID | Admitting: Infectious Diseases

## 2024-08-28 ENCOUNTER — Ambulatory Visit: Admit: 2024-08-28 | Discharge: 2024-08-29 | Payer: MEDICAID

## 2024-08-28 DIAGNOSIS — R001 Bradycardia, unspecified: Principal | ICD-10-CM

## 2024-08-28 DIAGNOSIS — I10 Essential (primary) hypertension: Principal | ICD-10-CM

## 2024-08-28 DIAGNOSIS — R0609 Other forms of dyspnea: Principal | ICD-10-CM

## 2024-08-28 DIAGNOSIS — I700 Atherosclerosis of aorta: Principal | ICD-10-CM

## 2024-08-28 DIAGNOSIS — J45909 Unspecified asthma, uncomplicated: Principal | ICD-10-CM

## 2024-08-28 DIAGNOSIS — F1491 History of cocaine use: Principal | ICD-10-CM

## 2024-10-12 ENCOUNTER — Inpatient Hospital Stay: Admit: 2024-10-12 | Discharge: 2024-10-12 | Payer: Medicaid (Managed Care)

## 2024-10-13 ENCOUNTER — Ambulatory Visit: Admit: 2024-10-13 | Discharge: 2024-10-13 | Payer: Medicaid (Managed Care)

## 2024-10-13 DIAGNOSIS — R0609 Other forms of dyspnea: Principal | ICD-10-CM

## 2024-10-13 DIAGNOSIS — J45909 Unspecified asthma, uncomplicated: Principal | ICD-10-CM

## 2024-10-13 MED ORDER — FLUTICASONE PROPIONATE 230 MCG-SALMETEROL 21 MCG/ACTUATION HFA INHALER
Freq: Two times a day (BID) | RESPIRATORY_TRACT | 3 refills | 0.00000 days | Status: CP
Start: 2024-10-13 — End: 2025-10-13

## 2024-10-21 ENCOUNTER — Encounter: Admit: 2024-10-21 | Discharge: 2024-10-21 | Payer: Medicaid (Managed Care)

## 2024-10-21 DIAGNOSIS — B20 Human immunodeficiency virus [HIV] disease: Principal | ICD-10-CM

## 2024-10-21 DIAGNOSIS — E785 Hyperlipidemia, unspecified: Principal | ICD-10-CM

## 2024-10-21 DIAGNOSIS — Z1211 Encounter for screening for malignant neoplasm of colon: Principal | ICD-10-CM

## 2024-10-21 DIAGNOSIS — Z5181 Encounter for therapeutic drug level monitoring: Principal | ICD-10-CM

## 2024-10-21 DIAGNOSIS — I10 Essential (primary) hypertension: Principal | ICD-10-CM

## 2024-10-21 DIAGNOSIS — F1414 Cocaine abuse with cocaine-induced mood disorder: Principal | ICD-10-CM

## 2024-10-21 DIAGNOSIS — Z79899 Other long term (current) drug therapy: Principal | ICD-10-CM

## 2024-10-21 DIAGNOSIS — Z9189 Other specified personal risk factors, not elsewhere classified: Principal | ICD-10-CM

## 2024-10-21 MED ORDER — LOSARTAN 50 MG TABLET
ORAL_TABLET | Freq: Every day | ORAL | 3 refills | 90.00000 days | Status: CP
Start: 2024-10-21 — End: 2025-11-25

## 2024-10-21 MED ORDER — ATORVASTATIN 20 MG TABLET
ORAL_TABLET | Freq: Every day | ORAL | 3 refills | 90.00000 days | Status: CP
Start: 2024-10-21 — End: 2025-11-25

## 2024-10-26 ENCOUNTER — Ambulatory Visit: Admit: 2024-10-26 | Discharge: 2024-10-27 | Payer: Medicaid (Managed Care)

## 2024-10-26 DIAGNOSIS — F17209 Nicotine dependence, unspecified, with unspecified nicotine-induced disorders: Principal | ICD-10-CM

## 2024-10-26 DIAGNOSIS — F1414 Cocaine abuse with cocaine-induced mood disorder: Principal | ICD-10-CM

## 2024-10-26 DIAGNOSIS — R0609 Other forms of dyspnea: Principal | ICD-10-CM

## 2024-10-26 DIAGNOSIS — R001 Bradycardia, unspecified: Principal | ICD-10-CM

## 2024-10-26 DIAGNOSIS — Z21 Asymptomatic human immunodeficiency virus [HIV] infection status: Principal | ICD-10-CM

## 2024-10-26 DIAGNOSIS — I10 Essential (primary) hypertension: Principal | ICD-10-CM

## 2024-10-26 DIAGNOSIS — F1020 Alcohol dependence, uncomplicated: Principal | ICD-10-CM

## 2024-10-26 DIAGNOSIS — Z01810 Encounter for preprocedural cardiovascular examination: Principal | ICD-10-CM

## 2024-10-26 MED ORDER — LOSARTAN 50 MG-HYDROCHLOROTHIAZIDE 12.5 MG TABLET
ORAL_TABLET | Freq: Every day | ORAL | 3 refills | 90.00000 days | Status: CP
Start: 2024-10-26 — End: ?

## 2024-10-30 ENCOUNTER — Inpatient Hospital Stay: Admit: 2024-10-30 | Discharge: 2024-10-30 | Payer: Medicaid (Managed Care)

## 2024-11-06 ENCOUNTER — Inpatient Hospital Stay: Admit: 2024-11-06 | Discharge: 2024-11-07 | Payer: Medicaid (Managed Care)

## 2024-11-19 ENCOUNTER — Inpatient Hospital Stay: Admit: 2024-11-19 | Discharge: 2024-11-19 | Payer: Medicaid (Managed Care)

## 2024-12-11 ENCOUNTER — Emergency Department: Payer: MEDICAID

## 2024-12-11 ENCOUNTER — Encounter: Payer: Self-pay | Admitting: Emergency Medicine

## 2024-12-11 ENCOUNTER — Other Ambulatory Visit: Payer: Self-pay

## 2024-12-11 ENCOUNTER — Emergency Department
Admission: EM | Admit: 2024-12-11 | Discharge: 2024-12-11 | Disposition: A | Discharge status: Home / Self Care | Payer: MEDICAID | Attending: Emergency Medicine | Admitting: Emergency Medicine

## 2024-12-11 DIAGNOSIS — R059 Cough, unspecified: Secondary | ICD-10-CM | POA: Diagnosis present

## 2024-12-11 DIAGNOSIS — J101 Influenza due to other identified influenza virus with other respiratory manifestations: Secondary | ICD-10-CM | POA: Diagnosis not present

## 2024-12-11 DIAGNOSIS — Z21 Asymptomatic human immunodeficiency virus [HIV] infection status: Secondary | ICD-10-CM | POA: Diagnosis not present

## 2024-12-11 DIAGNOSIS — I10 Essential (primary) hypertension: Secondary | ICD-10-CM | POA: Insufficient documentation

## 2024-12-11 LAB — RESP PANEL BY RT-PCR (RSV, FLU A&B, COVID)  RVPGX2
Influenza A by PCR: POSITIVE — AB
Influenza B by PCR: NEGATIVE
Resp Syncytial Virus by PCR: NEGATIVE
SARS Coronavirus 2 by RT PCR: NEGATIVE

## 2024-12-11 MED ORDER — ACETAMINOPHEN 325 MG PO TABS
650.0000 mg | ORAL_TABLET | Freq: Once | ORAL | Status: AC
  Administered 2024-12-11: 650 mg via ORAL
  Filled 2024-12-11: qty 2

## 2024-12-11 MED ORDER — BENZONATATE 100 MG PO CAPS: 100.0000 mg | ORAL_CAPSULE | Freq: Three times a day (TID) | ORAL | 0 refills | Status: AC | PRN

## 2024-12-11 NOTE — ED Provider Notes (Signed)
 "  [facility] Provider Note    Event Date/Time   First MD Initiated Contact with Patient 12/11/24 1247     (approximate)   History   Cough   HPI  Roy [name] is a 58 y.o. male  with Pmh of HIV, Htn, substance abuse presents for evaluation of a cough for 3 days. Patient also endorses fever, congestion and body aches.  No shortness of breath or chest pain.  He has been taking Tylenol  and ibuprofen  as needed for pain and fever.     Physical Exam   Triage Vital Signs: ED Triage Vitals  Encounter Vitals Group     BP 12/11/24 1144 132/85     Girls Systolic BP Percentile --      Girls Diastolic BP Percentile --      Boys Systolic BP Percentile --      Boys Diastolic BP Percentile --      Pulse Rate 12/11/24 1144 91     Resp 12/11/24 1144 18     Temp 12/11/24 1144 (!) 102.9 F (39.4 C)     Temp Source 12/11/24 1144 Oral     SpO2 12/11/24 1144 92 %     Weight --      Height --      Head Circumference --      Peak Flow --      Pain Score 12/11/24 1145 0     Pain Loc --      Pain Education --      Exclude from Growth Chart --     Most recent vital signs: Vitals:   12/11/24 1250 12/11/24 1343  BP:    Pulse:    Resp:    Temp:  100.3 F (37.9 C)  SpO2: 100%    General: Awake, no distress.  CV:  Good peripheral perfusion.  RRR. Resp:  Normal effort.  CTAB. Abd:  No distention.  Other:     ED Results / Procedures / Treatments   Labs (all labs ordered are listed, but only abnormal results are displayed) Labs Reviewed  RESP PANEL BY RT-PCR (RSV, FLU A&B, COVID)  [id number] - Abnormal; Notable for the following components:      Result Value   Influenza A by PCR POSITIVE (*)    All other components within normal limits     PROCEDURES:  Critical Care performed: No  Procedures   MEDICATIONS ORDERED IN ED: Medications  acetaminophen  (TYLENOL ) tablet 650 mg (650 mg Oral Given 12/11/24 1153)     IMPRESSION / MDM / ASSESSMENT AND  PLAN / ED COURSE  I reviewed the triage vital signs and the nursing notes.                             58 year old male presents for evaluation of flulike symptoms.  Patient was febrile on presentation but this improved after taking some Tylenol  here.  Vital signs stable otherwise.  Differential diagnosis includes, but is not limited to, flu, COVID, RSV, pharyngitis, other viral infection.  [name] presentation is most consistent with acute complicated illness / injury requiring diagnostic workup.  Respiratory panel is positive for influenza A.  Did consider giving patient Tamiflu given his history of HIV however he is outside of the 48-hour window.  Encouraged him to continue taking over-the-counter cold medicines as needed.  Advised him on return precautions.  Patient did not need a note for work.  He  voiced understanding, all questions were answered and he was stable at discharge.       FINAL CLINICAL IMPRESSION(S) / ED DIAGNOSES   Final diagnoses:  Influenza A     Rx / DC Orders   ED Discharge Orders          Ordered    benzonatate  (TESSALON ) 100 MG capsule  3 times daily PRN        12/11/24 1350             Note:  This document was prepared using Dragon voice recognition software and may include unintentional dictation errors.   Cleaster Tinnie LABOR, PA-C 12/11/24 1351    Dorothyann Drivers, MD 12/11/24 1830  "

## 2024-12-11 NOTE — ED Triage Notes (Signed)
 Pt reports cough, fever, and congestion.

## 2024-12-11 NOTE — Discharge Instructions (Signed)
 You tested positive for the flu today.  This is a viral illness which will resolve on its own with time.  You do not need an antibiotic.  You can take over-the-counter cold medicine as needed to manage your symptoms.  If you are taking combination cold medicine keep in mind that this often contains Tylenol so if you need additional medication for body aches or fever control please take Motrin or ibuprofen.  Your symptoms should resolve with time, if you have had symptoms for greater than 10 days please be evaluated by another healthcare provider as at this point it may have developed into a bacterial infection which requires a different treatment.  Return to the emergency department with worsening symptoms.

## 2024-12-27 ENCOUNTER — Emergency Department (HOSPITAL_COMMUNITY): Payer: MEDICAID | Admitting: Certified Registered"

## 2024-12-27 ENCOUNTER — Inpatient Hospital Stay (HOSPITAL_COMMUNITY): Payer: MEDICAID

## 2024-12-27 ENCOUNTER — Inpatient Hospital Stay (HOSPITAL_COMMUNITY)
Admission: EM | Admit: 2024-12-27 | Payer: MEDICAID | Source: Home / Self Care | Attending: Internal Medicine | Admitting: Internal Medicine

## 2024-12-27 ENCOUNTER — Encounter (HOSPITAL_COMMUNITY): Admission: EM | Payer: Self-pay | Source: Home / Self Care | Attending: Internal Medicine

## 2024-12-27 ENCOUNTER — Inpatient Hospital Stay (HOSPITAL_COMMUNITY): Payer: MEDICAID | Admitting: Anesthesiology

## 2024-12-27 ENCOUNTER — Emergency Department (HOSPITAL_COMMUNITY): Payer: MEDICAID

## 2024-12-27 DIAGNOSIS — B20 Human immunodeficiency virus [HIV] disease: Secondary | ICD-10-CM

## 2024-12-27 DIAGNOSIS — I639 Cerebral infarction, unspecified: Secondary | ICD-10-CM | POA: Diagnosis not present

## 2024-12-27 DIAGNOSIS — R29730 NIHSS score 30: Secondary | ICD-10-CM | POA: Diagnosis not present

## 2024-12-27 DIAGNOSIS — F17210 Nicotine dependence, cigarettes, uncomplicated: Secondary | ICD-10-CM

## 2024-12-27 DIAGNOSIS — I7772 Dissection of iliac artery: Secondary | ICD-10-CM

## 2024-12-27 DIAGNOSIS — I70221 Atherosclerosis of native arteries of extremities with rest pain, right leg: Secondary | ICD-10-CM | POA: Diagnosis not present

## 2024-12-27 DIAGNOSIS — I10 Essential (primary) hypertension: Secondary | ICD-10-CM

## 2024-12-27 DIAGNOSIS — I2510 Atherosclerotic heart disease of native coronary artery without angina pectoris: Secondary | ICD-10-CM | POA: Diagnosis not present

## 2024-12-27 DIAGNOSIS — I69391 Dysphagia following cerebral infarction: Secondary | ICD-10-CM

## 2024-12-27 DIAGNOSIS — I998 Other disorder of circulatory system: Secondary | ICD-10-CM | POA: Diagnosis not present

## 2024-12-27 DIAGNOSIS — I7777 Dissection of artery of lower extremity: Secondary | ICD-10-CM

## 2024-12-27 DIAGNOSIS — I63512 Cerebral infarction due to unspecified occlusion or stenosis of left middle cerebral artery: Principal | ICD-10-CM

## 2024-12-27 DIAGNOSIS — G9340 Encephalopathy, unspecified: Secondary | ICD-10-CM | POA: Diagnosis not present

## 2024-12-27 DIAGNOSIS — I6522 Occlusion and stenosis of left carotid artery: Secondary | ICD-10-CM

## 2024-12-27 DIAGNOSIS — I6602 Occlusion and stenosis of left middle cerebral artery: Secondary | ICD-10-CM

## 2024-12-27 DIAGNOSIS — I63232 Cerebral infarction due to unspecified occlusion or stenosis of left carotid arteries: Secondary | ICD-10-CM | POA: Diagnosis not present

## 2024-12-27 DIAGNOSIS — I70201 Unspecified atherosclerosis of native arteries of extremities, right leg: Secondary | ICD-10-CM

## 2024-12-27 DIAGNOSIS — I9789 Other postprocedural complications and disorders of the circulatory system, not elsewhere classified: Secondary | ICD-10-CM

## 2024-12-27 HISTORY — PX: THROMBECTOMY FEMORAL ARTERY: SHX6406

## 2024-12-27 HISTORY — PX: RADIOLOGY WITH ANESTHESIA: SHX6223

## 2024-12-27 HISTORY — PX: VEIN HARVEST: SHX6363

## 2024-12-27 HISTORY — PX: ENDARTERECTOMY FEMORAL: SHX5804

## 2024-12-27 HISTORY — PX: APPLICATION OF WOUND VAC: SHX5189

## 2024-12-27 HISTORY — PX: PATCH ANGIOPLASTY: SHX6230

## 2024-12-27 LAB — DIFFERENTIAL
Abs Immature Granulocytes: 0.03 K/uL (ref 0.00–0.07)
Basophils Absolute: 0 K/uL (ref 0.0–0.1)
Basophils Relative: 0 %
Eosinophils Absolute: 0 K/uL (ref 0.0–0.5)
Eosinophils Relative: 1 %
Immature Granulocytes: 0 %
Lymphocytes Relative: 13 %
Lymphs Abs: 0.9 K/uL (ref 0.7–4.0)
Monocytes Absolute: 0.4 K/uL (ref 0.1–1.0)
Monocytes Relative: 6 %
Neutro Abs: 5.4 K/uL (ref 1.7–7.7)
Neutrophils Relative %: 80 %

## 2024-12-27 LAB — I-STAT CHEM 8, ED
BUN: 22 mg/dL — ABNORMAL HIGH (ref 6–20)
Calcium, Ion: 1.06 mmol/L — ABNORMAL LOW (ref 1.15–1.40)
Chloride: 104 mmol/L (ref 98–111)
Creatinine, Ser: 1.4 mg/dL — ABNORMAL HIGH (ref 0.61–1.24)
Glucose, Bld: 93 mg/dL (ref 70–99)
HCT: 39 % (ref 39.0–52.0)
Hemoglobin: 13.3 g/dL (ref 13.0–17.0)
Potassium: 3.6 mmol/L (ref 3.5–5.1)
Sodium: 140 mmol/L (ref 135–145)
TCO2: 23 mmol/L (ref 22–32)

## 2024-12-27 LAB — POCT I-STAT EG7
Acid-base deficit: 2 mmol/L (ref 0.0–2.0)
Bicarbonate: 23.7 mmol/L (ref 20.0–28.0)
Calcium, Ion: 1.12 mmol/L — ABNORMAL LOW (ref 1.15–1.40)
HCT: 32 % — ABNORMAL LOW (ref 39.0–52.0)
Hemoglobin: 10.9 g/dL — ABNORMAL LOW (ref 13.0–17.0)
O2 Saturation: 68 %
Patient temperature: 36.2
Potassium: 4 mmol/L (ref 3.5–5.1)
Sodium: 140 mmol/L (ref 135–145)
TCO2: 25 mmol/L (ref 22–32)
pCO2, Ven: 42.7 mmHg — ABNORMAL LOW (ref 44–60)
pH, Ven: 7.349 (ref 7.25–7.43)
pO2, Ven: 36 mmHg (ref 32–45)

## 2024-12-27 LAB — PROTIME-INR
INR: 1 (ref 0.8–1.2)
Prothrombin Time: 14 s (ref 11.4–15.2)

## 2024-12-27 LAB — CBG MONITORING, ED: Glucose-Capillary: 102 mg/dL — ABNORMAL HIGH (ref 70–99)

## 2024-12-27 LAB — CBC
HCT: 38.8 % — ABNORMAL LOW (ref 39.0–52.0)
Hemoglobin: 13 g/dL (ref 13.0–17.0)
MCH: 34.5 pg — ABNORMAL HIGH (ref 26.0–34.0)
MCHC: 33.5 g/dL (ref 30.0–36.0)
MCV: 102.9 fL — ABNORMAL HIGH (ref 80.0–100.0)
Platelets: 235 K/uL (ref 150–400)
RBC: 3.77 MIL/uL — ABNORMAL LOW (ref 4.22–5.81)
RDW: 12.1 % (ref 11.5–15.5)
WBC: 6.7 K/uL (ref 4.0–10.5)
nRBC: 0 % (ref 0.0–0.2)

## 2024-12-27 LAB — COMPREHENSIVE METABOLIC PANEL WITH GFR
ALT: 14 U/L (ref 0–44)
AST: 19 U/L (ref 15–41)
Albumin: 3.7 g/dL (ref 3.5–5.0)
Alkaline Phosphatase: 77 U/L (ref 38–126)
Anion gap: 8 (ref 5–15)
BUN: 21 mg/dL — ABNORMAL HIGH (ref 6–20)
CO2: 25 mmol/L (ref 22–32)
Calcium: 8.4 mg/dL — ABNORMAL LOW (ref 8.9–10.3)
Chloride: 106 mmol/L (ref 98–111)
Creatinine, Ser: 1.33 mg/dL — ABNORMAL HIGH (ref 0.61–1.24)
GFR, Estimated: >60 mL/min
Glucose, Bld: 102 mg/dL — ABNORMAL HIGH (ref 70–99)
Potassium: 3.7 mmol/L (ref 3.5–5.1)
Sodium: 139 mmol/L (ref 135–145)
Total Bilirubin: 0.2 mg/dL (ref 0.0–1.2)
Total Protein: 6.5 g/dL (ref 6.5–8.1)

## 2024-12-27 LAB — URINE DRUG SCREEN
Amphetamines: NEGATIVE
Barbiturates: NEGATIVE
Benzodiazepines: POSITIVE — AB
Cocaine: POSITIVE — AB
Fentanyl: POSITIVE — AB
Methadone Scn, Ur: NEGATIVE
Opiates: NEGATIVE
Tetrahydrocannabinol: NEGATIVE

## 2024-12-27 LAB — HEMOGLOBIN A1C
Hgb A1c MFr Bld: 5.8 % — ABNORMAL HIGH (ref 4.8–5.6)
Mean Plasma Glucose: 119.76 mg/dL

## 2024-12-27 LAB — ETHANOL: Alcohol, Ethyl (B): <15 mg/dL

## 2024-12-27 LAB — MRSA NEXT GEN BY PCR, NASAL: MRSA by PCR Next Gen: NOT DETECTED

## 2024-12-27 LAB — PREPARE RBC (CROSSMATCH)

## 2024-12-27 LAB — APTT: aPTT: 31 s (ref 24–36)

## 2024-12-27 MED ORDER — ALBUTEROL SULFATE HFA 108 (90 BASE) MCG/ACT IN AERS: 1.0000 | INHALATION_SPRAY | RESPIRATORY_TRACT | Status: DC | PRN

## 2024-12-27 MED ORDER — BICTEGRAVIR-EMTRICITAB-TENOFOV 50-200-25 MG PO TABS
1.0000 | ORAL_TABLET | ORAL | Status: AC
  Administered 2024-12-28 – 2025-01-22 (×26): 1 via ORAL
  Filled 2024-12-27 (×28): qty 1

## 2024-12-27 MED ORDER — VASOPRESSIN 20 UNIT/ML IV SOLN
INTRAVENOUS | Status: AC
  Filled 2024-12-27: qty 1

## 2024-12-27 MED ORDER — SENNOSIDES-DOCUSATE SODIUM 8.6-50 MG PO TABS
1.0000 | ORAL_TABLET | Freq: Every evening | ORAL | Status: AC | PRN
  Administered 2025-01-04: 1 via ORAL
  Filled 2024-12-27: qty 1

## 2024-12-27 MED ORDER — SODIUM CHLORIDE 0.9% FLUSH: 3.0000 mL | Freq: Once | INTRAVENOUS | Status: AC

## 2024-12-27 MED ORDER — GLYCOPYRROLATE PF 0.2 MG/ML IJ SOSY
PREFILLED_SYRINGE | INTRAMUSCULAR | Status: DC | PRN
  Administered 2024-12-27: .2 mg via INTRAVENOUS

## 2024-12-27 MED ORDER — SUCCINYLCHOLINE CHLORIDE 200 MG/10ML IV SOSY
PREFILLED_SYRINGE | INTRAVENOUS | Status: AC
  Filled 2024-12-27: qty 20

## 2024-12-27 MED ORDER — LACTATED RINGERS IV SOLN: INTRAVENOUS | Status: DC | PRN

## 2024-12-27 MED ORDER — LIDOCAINE 2% (20 MG/ML) 5 ML SYRINGE
INTRAMUSCULAR | Status: DC | PRN
  Administered 2024-12-27: 80 mg via INTRAVENOUS

## 2024-12-27 MED ORDER — ACETAMINOPHEN 650 MG RE SUPP: 650.0000 mg | RECTAL | Status: AC | PRN

## 2024-12-27 MED ORDER — EPHEDRINE SULFATE-NACL 50-0.9 MG/10ML-% IV SOSY
PREFILLED_SYRINGE | INTRAVENOUS | Status: DC | PRN
  Administered 2024-12-27: 5 mg via INTRAVENOUS

## 2024-12-27 MED ORDER — EPINEPHRINE 1 MG/10ML IV SOSY
PREFILLED_SYRINGE | INTRAVENOUS | Status: DC | PRN
  Administered 2024-12-27: 10 ug via INTRAVENOUS

## 2024-12-27 MED ORDER — PROPOFOL 10 MG/ML IV BOLUS
INTRAVENOUS | Status: AC
  Filled 2024-12-27: qty 20

## 2024-12-27 MED ORDER — IOHEXOL 350 MG/ML SOLN
75.0000 mL | Freq: Once | INTRAVENOUS | Status: AC | PRN
  Administered 2024-12-27: 75 mL via INTRAVENOUS

## 2024-12-27 MED ORDER — PHENYLEPHRINE HCL-NACL 20-0.9 MG/250ML-% IV SOLN
INTRAVENOUS | Status: DC | PRN
  Administered 2024-12-27: 40 ug/min via INTRAVENOUS

## 2024-12-27 MED ORDER — SODIUM CHLORIDE 0.9 % IV SOLN: INTRAVENOUS | Status: AC

## 2024-12-27 MED ORDER — PROTAMINE SULFATE 10 MG/ML IV SOLN
INTRAVENOUS | Status: DC | PRN
  Administered 2024-12-27: 50 mg via INTRAVENOUS

## 2024-12-27 MED ORDER — CEFAZOLIN SODIUM 1 G IJ SOLR
INTRAMUSCULAR | Status: AC
  Filled 2024-12-27: qty 20

## 2024-12-27 MED ORDER — CLEVIDIPINE BUTYRATE 0.5 MG/ML IV EMUL: 0.0000 mg/h | INTRAVENOUS | Status: DC

## 2024-12-27 MED ORDER — HYDRALAZINE HCL 20 MG/ML IJ SOLN: 10.0000 mg | INTRAMUSCULAR | Status: AC | PRN

## 2024-12-27 MED ORDER — ACETAMINOPHEN 160 MG/5ML PO SOLN: 650.0000 mg | ORAL | Status: AC | PRN

## 2024-12-27 MED ORDER — SODIUM CHLORIDE 0.9 % IV BOLUS: 250.0000 mL | INTRAVENOUS | Status: AC | PRN

## 2024-12-27 MED ORDER — FENTANYL CITRATE (PF) 250 MCG/5ML IJ SOLN
INTRAMUSCULAR | Status: AC
  Filled 2024-12-27: qty 5

## 2024-12-27 MED ORDER — DEXAMETHASONE SOD PHOSPHATE PF 10 MG/ML IJ SOLN
INTRAMUSCULAR | Status: DC | PRN
  Administered 2024-12-27: 5 mg via INTRAVENOUS

## 2024-12-27 MED ORDER — SUGAMMADEX SODIUM 200 MG/2ML IV SOLN
INTRAVENOUS | Status: DC | PRN
  Administered 2024-12-27: 300 mg via INTRAVENOUS

## 2024-12-27 MED ORDER — 0.9 % SODIUM CHLORIDE (POUR BTL) OPTIME
TOPICAL | Status: DC | PRN
  Administered 2024-12-27: 2000 mL

## 2024-12-27 MED ORDER — PANTOPRAZOLE SODIUM 40 MG IV SOLR
40.0000 mg | Freq: Every day | INTRAVENOUS | Status: DC
  Administered 2024-12-27 – 2024-12-29 (×3): 40 mg via INTRAVENOUS
  Filled 2024-12-27 (×3): qty 10

## 2024-12-27 MED ORDER — FENTANYL CITRATE (PF) 50 MCG/ML IJ SOSY: 25.0000 ug | PREFILLED_SYRINGE | INTRAMUSCULAR | Status: DC | PRN

## 2024-12-27 MED ORDER — HEPARIN SODIUM (PORCINE) 1000 UNIT/ML IJ SOLN
INTRAMUSCULAR | Status: DC | PRN
  Administered 2024-12-27: 2000 [IU] via INTRAVENOUS
  Administered 2024-12-27: 5000 [IU] via INTRAVENOUS

## 2024-12-27 MED ORDER — SODIUM CHLORIDE 0.9% IV SOLUTION: Freq: Once | INTRAVENOUS | Status: AC

## 2024-12-27 MED ORDER — OXYCODONE-ACETAMINOPHEN 5-325 MG PO TABS
1.0000 | ORAL_TABLET | ORAL | Status: AC | PRN
  Administered 2024-12-29: 1 via ORAL
  Administered 2025-01-18 – 2025-01-22 (×8): 2 via ORAL
  Filled 2024-12-27 (×2): qty 2
  Filled 2024-12-27: qty 1
  Filled 2024-12-27: qty 2
  Filled 2024-12-27: qty 1
  Filled 2024-12-27 (×5): qty 2

## 2024-12-27 MED ORDER — HEPARIN 6000 UNIT IRRIGATION SOLUTION
Status: DC | PRN
  Administered 2024-12-27: 1

## 2024-12-27 MED ORDER — TENECTEPLASE 25 MG IV KIT
0.2500 mg/kg | PACK | Freq: Once | INTRAVENOUS | Status: AC
  Administered 2024-12-27: 20 mg via INTRAVENOUS
  Filled 2024-12-27: qty 5

## 2024-12-27 MED ORDER — PHENYLEPHRINE HCL-NACL 20-0.9 MG/250ML-% IV SOLN
INTRAVENOUS | Status: DC | PRN
  Administered 2024-12-27: 30 ug/min via INTRAVENOUS

## 2024-12-27 MED ORDER — LABETALOL HCL 5 MG/ML IV SOLN: 10.0000 mg | INTRAVENOUS | Status: DC | PRN

## 2024-12-27 MED ORDER — PROPOFOL 10 MG/ML IV BOLUS
INTRAVENOUS | Status: DC | PRN
  Administered 2024-12-27: 80 mg via INTRAVENOUS

## 2024-12-27 MED ORDER — STROKE: EARLY STAGES OF RECOVERY BOOK
Freq: Once | Status: AC
  Filled 2024-12-27: qty 1

## 2024-12-27 MED ORDER — IOHEXOL 300 MG/ML  SOLN
150.0000 mL | Freq: Once | INTRAMUSCULAR | Status: AC | PRN
  Administered 2024-12-27: 26 mL via INTRA_ARTERIAL

## 2024-12-27 MED ORDER — ONDANSETRON HCL 4 MG/2ML IJ SOLN
INTRAMUSCULAR | Status: AC
  Filled 2024-12-27: qty 2

## 2024-12-27 MED ORDER — ROSUVASTATIN CALCIUM 20 MG PO TABS
20.0000 mg | ORAL_TABLET | Freq: Every day | ORAL | Status: AC
  Administered 2024-12-28 – 2025-01-22 (×26): 20 mg via ORAL
  Filled 2024-12-27 (×26): qty 1

## 2024-12-27 MED ORDER — ONDANSETRON HCL 4 MG/2ML IJ SOLN
INTRAMUSCULAR | Status: DC | PRN
  Administered 2024-12-27: 4 mg via INTRAVENOUS

## 2024-12-27 MED ORDER — ONDANSETRON HCL 4 MG/2ML IJ SOLN: 4.0000 mg | Freq: Once | INTRAMUSCULAR | Status: DC | PRN

## 2024-12-27 MED ORDER — ACETAMINOPHEN 325 MG PO TABS
650.0000 mg | ORAL_TABLET | ORAL | Status: AC | PRN
  Administered 2024-12-28 – 2025-01-02 (×4): 650 mg via ORAL
  Filled 2024-12-27 (×4): qty 2

## 2024-12-27 MED ORDER — ROCURONIUM BROMIDE 10 MG/ML (PF) SYRINGE
PREFILLED_SYRINGE | INTRAVENOUS | Status: DC | PRN
  Administered 2024-12-27: 80 mg via INTRAVENOUS

## 2024-12-27 MED ORDER — EPHEDRINE SULFATE-NACL 50-0.9 MG/10ML-% IV SOSY
PREFILLED_SYRINGE | INTRAVENOUS | Status: DC | PRN
  Administered 2024-12-27: 10 mg via INTRAVENOUS
  Administered 2024-12-27: 5 mg via INTRAVENOUS
  Administered 2024-12-27: 10 mg via INTRAVENOUS

## 2024-12-27 MED ORDER — PHENYLEPHRINE 80 MCG/ML (10ML) SYRINGE FOR IV PUSH (FOR BLOOD PRESSURE SUPPORT)
PREFILLED_SYRINGE | INTRAVENOUS | Status: DC | PRN
  Administered 2024-12-27 (×2): 160 ug via INTRAVENOUS

## 2024-12-27 MED ORDER — CHLORHEXIDINE GLUCONATE CLOTH 2 % EX PADS
6.0000 | MEDICATED_PAD | Freq: Every day | CUTANEOUS | Status: AC
  Administered 2024-12-27 – 2025-01-22 (×27): 6 via TOPICAL

## 2024-12-27 MED ORDER — PROTAMINE SULFATE 10 MG/ML IV SOLN
INTRAVENOUS | Status: AC
  Filled 2024-12-27: qty 5

## 2024-12-27 MED ORDER — CEFAZOLIN SODIUM-DEXTROSE 2-3 GM-%(50ML) IV SOLR
INTRAVENOUS | Status: DC | PRN
  Administered 2024-12-27: 2 g via INTRAVENOUS

## 2024-12-27 MED ORDER — MORPHINE SULFATE (PF) 2 MG/ML IV SOLN: 2.0000 mg | INTRAVENOUS | Status: AC | PRN

## 2024-12-27 MED ORDER — HEPARIN SODIUM (PORCINE) 1000 UNIT/ML IJ SOLN
INTRAMUSCULAR | Status: AC
  Filled 2024-12-27: qty 10

## 2024-12-27 MED ORDER — ALBUTEROL SULFATE (2.5 MG/3ML) 0.083% IN NEBU: 2.5000 mg | INHALATION_SOLUTION | RESPIRATORY_TRACT | Status: AC | PRN

## 2024-12-27 MED ORDER — FENTANYL CITRATE (PF) 250 MCG/5ML IJ SOLN
INTRAMUSCULAR | Status: DC | PRN
  Administered 2024-12-27: 50 ug via INTRAVENOUS
  Administered 2024-12-27: 100 ug via INTRAVENOUS
  Administered 2024-12-27: 50 ug via INTRAVENOUS

## 2024-12-27 MED ORDER — SUCCINYLCHOLINE CHLORIDE 200 MG/10ML IV SOSY
PREFILLED_SYRINGE | INTRAVENOUS | Status: DC | PRN
  Administered 2024-12-27: 100 mg via INTRAVENOUS

## 2024-12-27 MED ORDER — PROPOFOL 10 MG/ML IV BOLUS
INTRAVENOUS | Status: DC | PRN
  Administered 2024-12-27: 100 mg via INTRAVENOUS

## 2024-12-27 MED ORDER — LIDOCAINE 2% (20 MG/ML) 5 ML SYRINGE
INTRAMUSCULAR | Status: DC | PRN
  Administered 2024-12-27: 60 mg via INTRAVENOUS

## 2024-12-27 MED ORDER — HEMOSTATIC AGENTS (NO CHARGE) OPTIME
TOPICAL | Status: DC | PRN
  Administered 2024-12-27: 1 via TOPICAL

## 2024-12-27 MED ORDER — VASOPRESSIN 20 UNIT/ML IV SOLN
INTRAVENOUS | Status: DC | PRN
  Administered 2024-12-27 (×5): 1 [IU] via INTRAVENOUS

## 2024-12-27 MED ORDER — ALBUMIN HUMAN 5 % IV SOLN: INTRAVENOUS | Status: DC | PRN

## 2024-12-27 MED ORDER — EPINEPHRINE 1 MG/10ML IV SOSY
PREFILLED_SYRINGE | INTRAVENOUS | Status: AC
  Filled 2024-12-27: qty 10

## 2024-12-27 MED ORDER — ORAL CARE MOUTH RINSE
15.0000 mL | OROMUCOSAL | Status: AC | PRN
  Administered 2024-12-28 (×3): 15 mL via OROMUCOSAL

## 2024-12-27 NOTE — Consult Note (Signed)
 NEUROLOGY CONSULT NOTE   Date of service: December 27, 2024 Patient Name: Roy [name] MRN:  [id number] DOB:  1966-07-10 Chief Complaint: right side weakness Requesting Provider: Doretha Folks, MD  History of Present Illness  Roy [name] is a 59 y.o. male with hx of ETOH abuse, psych history.  Got to treatment center yesterday for alcohol abuse.  Fell and found with right side weakness, not able to speak.  Evaluated urgent the bridge.  He was nonverbal right-sided weakness.  Initially concern for possible seizure and postictal confusion.  Head CT was negative CTA positive for left proximal MCA occlusion.  Some delay given tPA try to get in touch with family.  Initially got in touch with Garrel Fireman who is the patient's girlfriend's son who gave consent.  Eventually staff was able to find his sisters contact information.  I was able to speak with April who gave consent for TNK as well as thrombectomy.  Discussed risk of fatal bleeding that could occur.  She understands that she lives about 30 minutes away and is on the way.  EDP and stroke nurse witnessed the conversation on speaker phone.  LKW: 6:05 Modified rankin score: 0-Completely asymptomatic and back to baseline post- stroke IV Thrombolysis: Yes, given at 7:30 EVT: Yes. Code IR activated after discussion with Dr. Janjua.   NIHSS components Score: Comment  1a Level of Conscious 0[]  1[x]  2[]  3[]      1b LOC Questions 0[]  1[]  2[x]       1c LOC Commands 0[]  1[]  2[x]       2 Best Gaze 0[]  1[]  2[x]       3 Visual 0[]  1[]  2[x]  3[]      4 Facial Palsy 0[]  1[]  2[x]  3[]      5a Motor Arm - left 0[]  1[]  2[x]  3[]  4[]  UN[]    5b Motor Arm - Right 0[]  1[]  2[]  3[]  4[x]  UN[]    6a Motor Leg - Left 0[]  1[]  2[x]  3[]  4[]  UN[]    6b Motor Leg - Right 0[]  1[]  2[]  3[]  4[x]  UN[]    7 Limb Ataxia 0[x]  1[]  2[]  UN[]      8 Sensory 0[]  1[]  2[x]  UN[]      9 Best Language 0[]  1[]  2[]  3[x]      10 Dysarthria 0[]  1[]  2[x]  UN[]      11 Extinct. and  Inattention 0[x]  1[]  2[]       TOTAL: 30      ROS   Unable to ascertain due to AMS  Past History   Past Medical History:  Diagnosis Date   AIDS (acquired immune deficiency syndrome) (HCC)    Angioedema    ARF (acute renal failure)    Arthritis    Asthma    Bipolar disorder (HCC)    BPH (benign prostatic hyperplasia)    Bradycardia    Bronchitis    Cocaine abuse (HCC)    Coronary artery disease    Depression    Dyslipidemia    Dysrhythmia    1st degree heart block/ brady   Genital warts    GERD (gastroesophageal reflux disease)    Hepatitis C    treated   Hernia of abdominal wall    HIV (human immunodeficiency virus infection) (HCC)    HTN (hypertension)    Myocardial infarction (HCC) 2020   pt states stress test showed mild heart attack   Pre-diabetes    Septic hip (HCC)    SVT (supraventricular tachycardia)     Past Surgical History:  Procedure Laterality Date  APPLICATION OF WOUND VAC Right 09/01/2019   Procedure: APPLICATION OF WOUND VAC;  Surgeon: Kathlynn Sharper, MD;  Location: [facility];  Service: Orthopedics;  Laterality: Right;  Serial # [id number]   HERNIA REPAIR Left    inguinal   TOE SURGERY Right    TOTAL HIP ARTHROPLASTY Right 09/01/2019   Procedure: TOTAL HIP ARTHROPLASTY ANTERIOR APPROACH;  Surgeon: Kathlynn Sharper, MD;  Location: [facility];  Service: Orthopedics;  Laterality: Right;    Family History: Family History  Problem Relation Age of Onset   Cancer Brother    Uterine cancer Mother    CAD Mother    Hypertension Mother    Hyperlipidemia Mother     Social History  reports that he has been smoking cigarettes. He has a 10 pack-year smoking history. He has never used smokeless tobacco. He reports current alcohol use. He reports current drug use. Drugs: Cocaine and Crack cocaine.  Allergies[1]  Medications  Current Medications[2]  Vitals   There were no vitals filed for this visit.  There is no height or weight on file to calculate  BMI.   Physical Exam    Head: Normocephalic.  Cardiovascular: Normal rate and regular rhythm.  Respiratory: Effort normal, non-labored breathing.  GI: Soft.  No distension. There is no tenderness.  Skin: WDI.   Neurologic Examination   General - looks disheveled.  Cardiovascular - Regular rate and rhythm   Mental Status -  Looks around, not able to speak or follow commands.    Cranial Nerves II - XII - PERRL b/l 3mm.  Gait and Station - deferred.    Labs/Imaging/Neurodiagnostic studies   CBC: No results for input(s): WBC, NEUTROABS, HGB, HCT, MCV, PLT in the last 168 hours. Basic Metabolic Panel:  Lab Results  Component Value Date   NA 138 06/03/2024   K 4.1 06/03/2024   CO2 25 06/03/2024   GLUCOSE 101 (H) 06/03/2024   BUN 23 (H) 06/03/2024   CREATININE 1.40 (H) 06/03/2024   CALCIUM  9.0 06/03/2024   GFRNONAA 58 (L) 06/03/2024   GFRAA 54 (L) 07/12/2020   Lipid Panel:  Lab Results  Component Value Date   LDLCALC 102 (H) 01/23/2019   HgbA1c:  Lab Results  Component Value Date   HGBA1C 5.8 (H) 01/14/2024   Urine Drug Screen:     Component Value Date/Time   LABOPIA NONE DETECTED 06/03/2024 1116   COCAINSCRNUR POSITIVE (A) 06/03/2024 1116   LABBENZ NONE DETECTED 06/03/2024 1116   AMPHETMU NONE DETECTED 06/03/2024 1116   THCU NONE DETECTED 06/03/2024 1116   LABBARB NONE DETECTED 06/03/2024 1116    Alcohol Level     Component Value Date/Time   ETH <15 06/03/2024 1116   INR  Lab Results  Component Value Date   INR 1.0 05/25/2020   APTT  Lab Results  Component Value Date   APTT 31 08/27/2019   AED levels: No results found for: PHENYTOIN, ZONISAMIDE, LAMOTRIGINE, LEVETIRACETA  CT Head without contrast(Personally reviewed): 1. Hyperdense left MCA and ASPECTS 7, consistent with ELVO. No intracranial hemorrhage or mass effect.  CT angio Head and Neck with contrast(Personally reviewed):  IMPRESSION: 1. Left ICA occlusion in the  neck with a long tapered appearance; either long segment thrombus or dissection. Terminus reconstitute from left Pcomm. Left MCA origin occluded.   ASSESSMENT   Roy [name] is a 59 y.o. male history of HIV, history of hep C right hip prosthetic alcohol abuse.  History of tobacco and cocaine abuse.  Admitted to  treatment facility for alcohol use yesterday.  This morning found to have right-sided weakness and inability to speak after a fall.  Evaluated the bridge had a NIH stroke scale of 30.  Some difficulty getting in touch with family members eventually was able to get in touch with the patient's sister who consented to TNK and thrombectomy. Code IR activated after TNK given in CT.  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 -Hyperglycemia management per SSI to maintain glucose 140-180mg /dL. -PT/OT/ST therapies and recommendations when able  Dysarthria Dysphagia following cerebral infarction  -NPO until cleared by speech -ST -PT/OT -PM&R consult  Encephalopathy h/o ETOH abuse. -Correct metabolic causes -Monitor possible alcohol withdrawal   CV  Essential (primary) hypertension  Hyperlipidemia, unspecified  - Statin for goal LDL < 70  Prophylaxis DVT:  SCDs   Diet: NPO until cleared by speech  Code Status: Full Code    THE FOLLOWING WERE PRESENT ON ADMISSION: CNS -  Acute Ischemic Stroke,  Infectious - HIV   Signed, Zeke CHRISTELLA Raddle, MD [facility]   Nashid Pellum,MD     This patient is critically ill due to acute  stroke s/p TNK and thrombectomy and at significant risk of neurological worsening, death form heart failure,  respiratory failure, recurrent stroke, bleeding from [facility], seizure, sepsis. This patient's care requires constant monitoring of vital signs, hemodynamics, respiratory and cardiac monitoring, review of multiple databases, neurological assessment, discussion with family, other specialists and medical decision making of high complexity. I spent 75 minutes of neurocritical care time in the care of this patient.   Emmie Frakes,MD      [1]  Allergies Allergen Reactions   Amlodipine  Swelling    Of the tongue   Lisinopril  Swelling   Statins Other (See Comments)    Body aches   Bee Pollen Itching and Other (See Comments)    Itchy eyes and runny nose   Lactose Other (See Comments)    GI distress   Pollen Extract Other (See Comments)    Itchy eyes and runny nose   Yellow Jacket Venom Itching  [2]  Current Facility-Administered Medications:    sodium chloride  flush (NS) 0.9 % injection 3 mL, 3 mL, Intravenous, Once, Doretha Folks, MD  Current Outpatient Medications:    albuterol  (VENTOLIN  HFA) 108 (90 Base) MCG/ACT inhaler, Inhale 1-2 puffs into the lungs every 4 (four) hours as needed for wheezing or shortness of breath., Disp: 18 g, Rfl: 1   benzonatate  (TESSALON ) 100 MG capsule, Take 1 capsule (100 mg total) by mouth 3 (three) times daily as needed., Disp: 20 capsule, Rfl: 0   BIKTARVY  50-200-25 MG TABS tablet, Take 1 tablet by mouth every morning., Disp: 30 tablet, Rfl: 6   cetirizine (ZYRTEC) 10 MG tablet, Take 1 tablet by mouth daily., Disp: , Rfl:    loperamide  (IMODIUM ) 2 MG capsule, Take 1 capsule (2 mg total) by mouth as needed for diarrhea or loose stools. (Patient not taking: Reported on 06/03/2024), Disp: 30 capsule, Rfl: 0   losartan  (COZAAR ) 100 MG tablet, Take 1 tablet (100 mg total) by mouth every morning. Hold for next 3 days, Disp: , Rfl:    [Paused] mirtazapine  (REMERON ) 7.5 MG tablet, Take  1 tablet (7.5 mg total) by mouth at bedtime. (Patient not taking: Reported on  05/03/2024), Disp: 30 tablet, Rfl: 1   ondansetron  (ZOFRAN -ODT) 4 MG disintegrating tablet, Take 1 tablet (4 mg total) by mouth every 8 (eight) hours as needed for nausea or vomiting. (Patient not taking: Reported on 06/03/2024), Disp: 20 tablet, Rfl: 0   [Paused] risperiDONE  (RISPERDAL ) 1 MG tablet, Take 1 tablet (1 mg total) by mouth at bedtime. (Patient not taking: Reported on 05/03/2024), Disp: 30 tablet, Rfl: 1   rosuvastatin  (CRESTOR ) 20 MG tablet, Take 20 mg by mouth daily., Disp: , Rfl:    traZODone  (DESYREL ) 100 MG tablet, Take 1 tablet (100 mg total) by mouth at bedtime as needed for sleep., Disp: 30 tablet, Rfl: 0

## 2024-12-27 NOTE — ED Provider Notes (Signed)
 " [facility] [facility] DE[facility]MENT AT [facility] Provider Note   CSN: [id number] Arrival date & time: 12/27/24  9297  An emergency department physician performed an initial assessment on this suspected stroke patient at 2.  Patient presents with: No chief complaint on file.   Roy [name] is a 60 y.o. male.   Patient is a 59 year old male with a history of hypertension, HIV, alcohol abuse, CAD, cocaine abuse, bipolar disease who is presenting today from an alcohol rehab facility due to altered mental status and concern for code stroke.  Stroke team present upon patient's arrival.  Patient was seen at 605 this morning sitting on the edge of his bed and they ask him if he was okay and he said yes.  They then heard him hit the floor and they came back in and found him on the floor altered and unable to move his left side.  When EMS arrived they noted the same.  Blood pressure in the 140s, heart rates are bradycardic and blood sugar was normal.  Prior to this patient was ANO x 4 and is highly functional able to do all ADLs.  The history is provided by the EMS personnel and medical records.       Prior to Admission medications  Medication Sig Start Date End Date Taking? Authorizing Provider  albuterol  (VENTOLIN  HFA) 108 (90 Base) MCG/ACT inhaler Inhale 1-2 puffs into the lungs every 4 (four) hours as needed for wheezing or shortness of breath. 05/23/20   Clapacs, Norleen DASEN, MD  benzonatate  (TESSALON ) 100 MG capsule Take 1 capsule (100 mg total) by mouth 3 (three) times daily as needed. 12/11/24   Cleaster Tinnie LABOR, PA-C  BIKTARVY  50-200-25 MG TABS tablet Take 1 tablet by mouth every morning. 01/14/24   Fayette Bodily, MD  cetirizine (ZYRTEC) 10 MG tablet Take 1 tablet by mouth daily. 04/29/23 04/28/24  [provider]  loperamide  (IMODIUM ) 2 MG capsule Take 1 capsule (2 mg total) by mouth as needed for diarrhea or loose stools. Patient not taking: Reported on  06/03/2024 05/04/24   Amin, Sumayya, MD  losartan  (COZAAR ) 100 MG tablet Take 1 tablet (100 mg total) by mouth every morning. Hold for next 3 days 05/04/24   Amin, Sumayya, MD  [Paused] mirtazapine  (REMERON ) 7.5 MG tablet Take 1 tablet (7.5 mg total) by mouth at bedtime. Patient not taking: Reported on 05/03/2024 Wait to take this until your doctor or other care provider tells you to start again. 11/02/20   Oneita Duwaine HERO, MD  ondansetron  (ZOFRAN -ODT) 4 MG disintegrating tablet Take 1 tablet (4 mg total) by mouth every 8 (eight) hours as needed for nausea or vomiting. Patient not taking: Reported on 06/03/2024 05/04/24   Amin, Sumayya, MD  [Paused] risperiDONE  (RISPERDAL ) 1 MG tablet Take 1 tablet (1 mg total) by mouth at bedtime. Patient not taking: Reported on 05/03/2024 Wait to take this until your doctor or other care provider tells you to start again. 11/02/20   Oneita Duwaine HERO, MD  rosuvastatin  (CRESTOR ) 20 MG tablet Take 20 mg by mouth daily.    [provider]  traZODone  (DESYREL ) 100 MG tablet Take 1 tablet (100 mg total) by mouth at bedtime as needed for sleep. 06/08/24 07/08/24  Chien, Stephanie, MD    Allergies: Amlodipine , Lisinopril , Statins, Bee pollen, Lactose, Pollen extract, and Yellow jacket venom    Review of Systems  Updated Vital Signs BP (!) 158/127   Pulse (!) 51   Wt 81.2  kg   SpO2 98%   BMI 26.44 kg/m   Physical Exam Vitals and nursing note reviewed.  Constitutional:      Appearance: He is well-developed.     Comments: Patient is somewhat lethargic but will eventually open his eyes when his name is called  HENT:     Head: Normocephalic and atraumatic.  Eyes:     Conjunctiva/sclera: Conjunctivae normal.     Pupils: Pupils are equal, round, and reactive to light.  Cardiovascular:     Rate and Rhythm: Regular rhythm. Bradycardia present.     Heart sounds: No murmur heard. Pulmonary:     Effort: Pulmonary effort is normal. No respiratory distress.      Breath sounds: Normal breath sounds. No wheezing or rales.  Abdominal:     General: There is no distension.     Palpations: Abdomen is soft.     Tenderness: There is no abdominal tenderness. There is no guarding or rebound.  Musculoskeletal:        General: No tenderness. Normal range of motion.     Cervical back: Normal range of motion and neck supple.     Right lower leg: No edema.     Left lower leg: No edema.  Skin:    General: Skin is warm and dry.     Findings: No erythema or rash.  Neurological:     Cranial Nerves: Facial asymmetry present.     Motor: Weakness present.     Comments: Patient occasionally will mumble but no comprehensive speech.  Patient cannot follow any commands.  But is noted to move his left arm and leg.  However does not appear to be able to hold up his right arm.  Occasionally is noted to see his left leg move.  Mild right sided facial droop.  Unable to assess the rest of the neuroexam due to patient's mental status  Psychiatric:        Behavior: Behavior normal.     (all labs ordered are listed, but only abnormal results are displayed) Labs Reviewed  CBC - Abnormal; Notable for the following components:      Result Value   RBC 3.77 (*)    HCT 38.8 (*)    MCV 102.9 (*)    MCH 34.5 (*)    All other components within normal limits  COMPREHENSIVE METABOLIC PANEL WITH GFR - Abnormal; Notable for the following components:   Glucose, Bld 102 (*)    BUN 21 (*)    Creatinine, Ser 1.33 (*)    Calcium  8.4 (*)    All other components within normal limits  I-STAT CHEM 8, ED - Abnormal; Notable for the following components:   BUN 22 (*)    Creatinine, Ser 1.40 (*)    Calcium , Ion 1.06 (*)    All other components within normal limits  CBG MONITORING, ED - Abnormal; Notable for the following components:   Glucose-Capillary 102 (*)    All other components within normal limits  PROTIME-INR  APTT  DIFFERENTIAL  ETHANOL    EKG: None  Radiology: CT ANGIO  HEAD NECK W WO CM (CODE STROKE) Result Date: 12/27/2024 EXAM: CTA Head and Neck with Intravenous Contrast. CT Head without Contrast. CLINICAL HISTORY: 59 year old male with acute neurological deficit, ELVO L MCA suspected. TECHNIQUE: Axial CTA images of the head and neck performed with intravenous contrast. MIP reconstructed images were created and reviewed. Axial computed tomography images of the head/brain performed without intravenous contrast. Note: Per PQRS,  the description of internal carotid artery percent stenosis, including 0 percent or normal exam, is based on North American Symptomatic Carotid Endarterectomy Trial (NASCET) criteria. Dose reduction technique was used including one or more of the following: automated exposure control, adjustment of mA and kV according to patient size, and/or iterative reconstruction. CONTRAST: 75 ml omnipaque  350; COMPARISON: Plain head CT 12/27/2024. FINDINGS: CT HEAD: BRAIN: Plain CT evidence of left MCA emergent large vessel occlusion, early infarct changes. No acute intraparenchymal hemorrhage. No mass lesion. No midline shift or extra-axial collection. VENTRICLES: No hydrocephalus. ORBITS: The orbits are unremarkable. SINUSES AND MASTOIDS: The paranasal sinuses and mastoid air cells are clear. CTA NECK: Thoracic Aorta: Mildly tortuous thoracic aortic arch and proximal descending aorta. Minimal arch atherosclerosis. 3 vessel arch configuration. Subclavian Arteries: Mild proximal left subclavian artery atherosclerosis without stenosis. COMMON CAROTID ARTERIES: No left CCA atherosclerosis. Minimal right carotid bifurcation atherosclerosis. No significant stenosis. No dissection or occlusion. INTERNAL CAROTID ARTERIES: Soft and calcified plaque at the left ICA origin and bulb, and distal to the bulb the left ICA is gradually occluded in the neck with a long tapering appearance (series 17 image 29). There is crescent shaped faint enhancement reconstitution just below the  skull base. Left ICA siphon is functionally occluded. Left ICA is occluded at the skull base with faint left ICA reconstitution. Improved left ICA terminus reconstitution from left posterior communicating artery collateral. Minimal right ICA siphon calcified plaque. No right carotid stenosis. No dissection or occlusion. VERTEBRAL ARTERIES: Normal right vertebral artery origin. Right vertebral artery V2 segment stenosis appears related to extrinsic mass effect from bulky cervical spine degeneration (series 7 image 220), moderate to severe stenosis results. No other right vertebral artery stenosis to the vertebrobasilar junction. Normal left vertebral artery origin. Fairly codominant left vertebral artery with mild tortuosity, no stenosis. Normal left PICA origin. No dissection or occlusion. CTA HEAD: ANTERIOR CEREBRAL ARTERIES: Left ACA origin remains patent. Left ACA A1 segment is diminutive. Normal anterior communicating artery. Symmetric and normal ACA branch enhancement. Normal right ACA origin. No significant stenosis. No occlusion. No aneurysm. MIDDLE CEREBRAL ARTERIES: Left MCA origin is occluded. Minimal left MCA branch reconstitution. Normal right MCA origin. No significant stenosis. No occlusion. No aneurysm. POSTERIOR CEREBRAL ARTERIES: Normal PCA origins. Right posterior communicating artery is diminutive or absent. Mild left PCA irregularity and stenosis at the P2/P3 segment junction (series 17 image 24). No significant stenosis. No occlusion. No aneurysm. BASILAR ARTERY: No significant stenosis. No occlusion. No aneurysm. Intracranial Venous: Early intracranial venous contrast timing but the major dural venous sinuses appear to be patent. SOFT TISSUES: No masses or lymphadenopathy. BONES: Dentition is absent. Ordinary cervical spine degeneration. Right vertebral artery V2 segment stenosis appears related to extrinsic mass effect from bulky cervical spine degeneration (series 7 image 220), moderate to  severe stenosis results. No acute osseous abnormality. LUNGS: Centrilobular emphysema and mild scarring in the visible upper lungs. IMPRESSION: 1. 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 Terminus reconstitute from left Pcomm. Left MCA origin occluded. 2. Preliminary of above communicated to Dr. Nichola at 0719 hours on 12/27/2024 by text page via the [facility] messaging system. 3. No other carotid stenosis. Moderate to severe right vertebral V2 stenosis; extrinsic mass effect from bulky cervical spine degeneration. Mild distal left PCA stenosis (P2/P3 junction). 4. Emphysema. Electronically signed by: Helayne Hurst MD MD 12/27/2024 07:30 AM EST RP Workstation: [id number]   CT HEAD CODE STROKE WO CONTRAST  Result Date: 12/27/2024 EXAM: CT HEAD WITHOUT CONTRAST 12/27/2024 07:05:00 AM TECHNIQUE: CT of the head was performed without the administration of intravenous contrast. Automated exposure control, iterative reconstruction, and/or weight based adjustment of the mA/kV was utilized to reduce the radiation dose to as low as reasonably achievable. COMPARISON: 05/08/2013 CLINICAL HISTORY: 59 year old male. Acute neurological deficit, stroke suspected. FINDINGS: BRAIN AND VENTRICLES: No acute hemorrhage. Loss of gray-white differentiation in keeping with early cytotoxic edema at the left insula, left inferior frontal gyrus. Subtle involvement of the left operculum also. Right hemisphere and posterior fossa gray-white differentiation maintained. No mass effect or midline shift. No hydrocephalus. No extra-axial collection. Mild calcified atherosclerosis at the skull base. Hyperdense left MCA strongly suggesting emergent large vessel occlusion. ORBITS: Leftward gaze deviation. SINUSES: Scattered mild paranasal sinus inflammation including some bubbly opacity. Tympanic cavities and mastoids are well aerated. SOFT TISSUES AND SKULL: No acute soft tissue  abnormality. No skull fracture. ALBERTA STROKE PROGRAM EARLY CT SCORE (ASPECTS): Ganglionic (caudate, IC, lentiform nucleus, insula, M1-M3): 4 Supraganglionic (M4-M6): 3 Total: 7 IMPRESSION: 1. Hyperdense left MCA and ASPECTS 7, consistent with ELVO. No intracranial hemorrhage or mass effect. 2. These results were communicated to Dr. Nichola at [phone] hours on 12/27/2024 by text page via the [facility] messaging system. Electronically signed by: Helayne Hurst MD MD 12/27/2024 07:17 AM EST RP Workstation: [id number]     Procedures   Medications Ordered in the ED  sodium chloride  flush (NS) 0.9 % injection 3 mL (has no administration in time range)  iohexol  (OMNIPAQUE ) 350 MG/ML injection 75 mL (75 mLs Intravenous Contrast Given 12/27/24 0725)  tenecteplase  (TNKASE ) injection for stroke 20 mg (20 mg Intravenous Given 12/27/24 0730)                                    Medical Decision Making Amount and/or Complexity of Data Reviewed Independent Historian: EMS External Data Reviewed: notes. Labs: ordered. Decision-making details documented in ED Course. Radiology: ordered and independent interpretation performed. Decision-making details documented in ED Course.  Risk Prescription drug management.   Pt with multiple medical problems and comorbidities and presenting today with a complaint that caries a high risk for morbidity and mortality.  Here today with the above complaints.  Concern for stroke versus seizure versus intracranial hemorrhage versus metabolic encephalopathy versus medication or substance cause.  Lower suspicion for infectious etiology. I have independently visualized and interpreted pt's images today.  CT with concern for an MCA stroke and CTA per radiology with left ICA occlusion in the neck with a long tapered appearance either long segment thrombosis or dissection with terminus reconstitute from the left.  Left MCA origin occluded no other stenosis is noted.  He does have moderate to  severe right vertebral V2 stenosis and bulky cervical spine degenerative changes. I independently interpreted patient's labs and Chem-8 with a creatinine of 1.4 otherwise normal, CBG normal, CBC without acute findings, EtOH is negative.  Neurology started TNKase  and patient will be going to IR.  Spoke with his sister April who gave consent for the patient as he is not able to consent himself at this time.  CRITICAL CARE Performed by: Tekelia Kareem Total critical care time: 30 minutes Critical care time was exclusive of separately billable procedures and treating other patients. Critical care was necessary to treat or prevent imminent or life-threatening deterioration. Critical care was time spent personally by me on the following activities:  development of treatment plan with patient and/or surrogate as well as nursing, discussions with consultants, evaluation of patient's response to treatment, examination of patient, obtaining history from patient or surrogate, ordering and performing treatments and interventions, ordering and review of laboratory studies, ordering and review of radiographic studies, pulse oximetry and re-evaluation of patient's condition.      Final diagnoses:  Cerebrovascular accident (CVA) due to occlusion of left middle cerebral artery [facility])    ED Discharge Orders     None          Doretha Folks, MD 12/27/24 [phone]  "

## 2024-12-27 NOTE — Progress Notes (Signed)
 PHARMACIST CODE STROKE RESPONSE  Notified to mix TNK at 0725 by Dr. Nichola Llanos MD.  TNK preparation completed at 0730  TNK dose = 20 mg IV over 5 seconds.   Issues/delays encountered (if applicable): None  Delanna Blacketer M Solly Derasmo 12/27/2024 7:38 AM

## 2024-12-27 NOTE — Anesthesia Postprocedure Evaluation (Signed)
"   Anesthesia Post Note  Patient: Roy [name]  Procedure(s) Performed: RADIOLOGY WITH ANESTHESIA     Patient location during evaluation: PACU Anesthesia Type: General Level of consciousness: awake and confused Pain management: pain level controlled Vital Signs Assessment: post-procedure vital signs reviewed and stable Respiratory status: spontaneous breathing, nonlabored ventilation, respiratory function stable and patient connected to nasal cannula oxygen Cardiovascular status: blood pressure returned to baseline and stable Postop Assessment: no apparent nausea or vomiting Anesthetic complications: no   No notable events documented.  Last Vitals:  Vitals:   12/27/24 0745 12/27/24 0912  BP: 139/74 (!) 129/94  Pulse: (!) 59   Resp: 18   SpO2: 100%     Last Pain: There were no vitals filed for this visit.               Garnette [name]      "

## 2024-12-27 NOTE — ED Triage Notes (Signed)
 CODE STROKE. Pt bib [place] from [facility]. LKW [id number], staff heard a fall and was reported that the pt could not move. No hx of stroke or seizures. Hx of ETOH withdrawal. Right sided weakness, unable to follow commands.  500mL of NS given in route. BP 148/88 CBG 80

## 2024-12-27 NOTE — Anesthesia Preprocedure Evaluation (Signed)
"                                    Anesthesia Evaluation  Patient identified by MRN, date of birth, ID band Patient confused    Reviewed: Allergy & Precautions, NPO status , [name] Chart, lab work & pertinent test results, Unable to perform ROS - Chart review onlyPreop documentation limited or incomplete due to emergent nature of procedure.  Airway Mallampati: II  TM Distance: >3 FB Neck ROM: Full    Dental  (+) Dental Advisory Given, Edentulous Lower, Edentulous Upper   Pulmonary asthma , Current Smoker   Pulmonary exam normal breath sounds clear to auscultation       Cardiovascular hypertension, + CAD, + Past MI and + Peripheral Vascular Disease (Right lower extremity acute limb ischemia)  Normal cardiovascular exam+ dysrhythmias Supra Ventricular Tachycardia  Rhythm:Regular Rate:Normal     Neuro/Psych  PSYCHIATRIC DISORDERS  Depression Bipolar Disorder   CVA, Residual Symptoms    GI/Hepatic ,GERD  ,,(+)     substance abuse  alcohol use and cocaine use, Hepatitis -, C  Endo/Other  negative endocrine ROS    Renal/GU Renal disease     Musculoskeletal  (+) Arthritis ,    Abdominal   Peds  Hematology  (+) HIV  Anesthesia Other Findings Day of surgery medications reviewed with the patient.  Reproductive/Obstetrics                              Anesthesia Physical Anesthesia Plan  ASA: 4 and emergent  Anesthesia Plan: General   Post-op Pain Management:    Induction: Intravenous, Rapid sequence and Cricoid pressure planned  PONV Risk Score and Plan: 1 and Dexamethasone  and Ondansetron   Airway Management Planned: Oral ETT  Additional Equipment:   Intra-op Plan:   Post-operative Plan: Possible Post-op intubation/ventilation  Informed Consent: I have reviewed the patients History and Physical, chart, labs and discussed the procedure including the risks, benefits and alternatives for the proposed anesthesia with the  patient or authorized representative who has indicated his/her understanding and acceptance.     History available from chart only, Only emergency history available and Dental advisory given  Plan Discussed with: CRNA  Anesthesia Plan Comments: (EMERGENCY CONSENT. )         Anesthesia Quick Evaluation  "

## 2024-12-27 NOTE — Progress Notes (Signed)
 PT Cancellation Note  Patient Details Name: Roy [name] MRN: [id number] DOB: [birth year]   Cancelled Treatment:    Reason Eval/Treat Not Completed: Active bedrest order. RN reporting pt not appropriate for PT today. Will plan to follow-up another day as able.   Theo Ferretti, PT, DPT Acute Rehabilitation Services  Office: [phone]    Theo CHRISTELLA Ferretti 12/27/2024, 3:15 PM

## 2024-12-27 NOTE — Anesthesia Procedure Notes (Signed)
 Procedure Name: Intubation Date/Time: 12/27/2024 10:21 AM  Performed by: Sherlyn Lapine, CRNAPre-anesthesia Checklist: Patient identified, Emergency Drugs available, Suction available and Patient being monitored Patient Re-evaluated:Patient Re-evaluated prior to induction Oxygen Delivery Method: Circle System Utilized Preoxygenation: Pre-oxygenation with 100% oxygen Induction Type: IV induction Ventilation: Mask ventilation without difficulty Laryngoscope Size: Miller and 2 Grade View: Grade I Tube type: Oral Tube size: 7.5 mm Number of attempts: 1 Airway Equipment and Method: Stylet and Oral airway Placement Confirmation: ETT inserted through vocal cords under direct vision, positive ETCO2 and breath sounds checked- equal and bilateral Secured at: 23 cm Tube secured with: Tape Dental Injury: Teeth and Oropharynx as per pre-operative assessment

## 2024-12-27 NOTE — Brief Op Note (Signed)
" °  NEUROSURGERY BRIEF THROMBECTOMY NOTE   PREOP DX: Stroke  POSTOP DX: Same  PROCEDURE: LEFT ICA/MCA Thrombectomy  SURGEON: Dino Sable   ANESTHESIA: GETA  EBL: Minimal  Number of Passes: 1  Technique: ASPIRATION  Final TICI score: 3  Post OP blood pressure goal: SBP<160  Arterial Angioplasty or Stent: No   Anti-Platelet Therapy: No   COMPLICATIONS: No   CONDITION: Stable to recovery  FINDINGS (Full report in [facility]): 1. LEFT ICA/MCA Occlusion - TICI III   Roy [name]  @today @ 8:34 AM  "

## 2024-12-27 NOTE — Transfer of Care (Signed)
 Immediate Anesthesia Transfer of Care Note  Patient: Roy [name]  Procedure(s) Performed: RIGHT FEMORAL ARTERY THROMBECTOMY (Right: Groin) RIGHT GREATER SAPHENOUS VEIN HARVEST, SURGICAL PROCUREMENT, VEIN (Right: Groin) PATCH ANGIOPLASTY, USING PATCH GREATER SAPHENOUS VEIN (Right: Groin) RIGHT GROIN PREVENA APPLICATION, WOUND VAC (Right: Groin) RIGHT FEMORAL ENDARTERECTOMY,L (Right: Groin)  Patient Location: PACU  Anesthesia Type:General  Level of Consciousness: drowsy  Airway & Oxygen Therapy: Patient Spontanous Breathing and Patient connected to face mask oxygen  Post-op Assessment: Report given to RN and Post -op Vital signs reviewed and stable  Post vital signs: Reviewed and stable  Last Vitals:  Vitals Value Taken Time  BP 115/63 12/27/24 13:10  Temp    Pulse 58 12/27/24 13:14  Resp 15 12/27/24 13:14  SpO2 100 % 12/27/24 13:14  Vitals shown include unfiled device data.  Last Pain: There were no vitals filed for this visit.       Complications: No notable events documented.

## 2024-12-27 NOTE — Anesthesia Procedure Notes (Signed)
 Procedure Name: Intubation Date/Time: 12/27/2024 8:00 AM  Performed by: Sherlyn Lapine, CRNAPre-anesthesia Checklist: Patient identified, Emergency Drugs available, Suction available and Patient being monitored Patient Re-evaluated:Patient Re-evaluated prior to induction Oxygen Delivery Method: Circle System Utilized Preoxygenation: Pre-oxygenation with 100% oxygen Induction Type: IV induction Ventilation: Mask ventilation without difficulty Laryngoscope Size: Miller and 2 Grade View: Grade I Tube type: Oral Tube size: 7.5 mm Number of attempts: 1 Airway Equipment and Method: Stylet and Oral airway Placement Confirmation: ETT inserted through vocal cords under direct vision, positive ETCO2 and breath sounds checked- equal and bilateral Secured at: 23 cm Tube secured with: Tape Dental Injury: Teeth and Oropharynx as per pre-operative assessment

## 2024-12-27 NOTE — Anesthesia Postprocedure Evaluation (Signed)
"   Anesthesia Post Note  Patient: Roy [name]  Procedure(s) Performed: RIGHT FEMORAL ARTERY THROMBECTOMY (Right: Groin) RIGHT GREATER SAPHENOUS VEIN HARVEST, SURGICAL PROCUREMENT, VEIN (Right: Groin) PATCH ANGIOPLASTY, USING PATCH GREATER SAPHENOUS VEIN (Right: Groin) RIGHT GROIN PREVENA APPLICATION, WOUND VAC (Right: Groin) RIGHT FEMORAL ENDARTERECTOMY,L (Right: Groin)     Patient location during evaluation: PACU Anesthesia Type: General Level of consciousness: awake and alert Pain management: pain level controlled Vital Signs Assessment: post-procedure vital signs reviewed and stable Respiratory status: spontaneous breathing, nonlabored ventilation and respiratory function stable Cardiovascular status: blood pressure returned to baseline and stable Postop Assessment: no apparent nausea or vomiting Anesthetic complications: no   No notable events documented.  Last Vitals:  Vitals:   12/27/24 1645 12/27/24 1700  BP: 102/65 107/65  Pulse: (!) 50 (!) 49  Resp: 13 13  Temp:    SpO2: 99% 100%    Last Pain:  Vitals:   12/27/24 1545  TempSrc: Axillary  PainSc:                  Garnette [name]      "

## 2024-12-27 NOTE — Op Note (Addendum)
" ° ° °  NAME: Shandon Burlingame    MRN: [id number] DOB: May 16, 1966    DATE OF OPERATION: 12/27/2024  PREOP DIAGNOSIS:    Right lower extremity acute limb ischemia  POSTOP DIAGNOSIS:    Same  PROCEDURE:    Right common femoral artery exposure Right saphenectomy Iliofemoral endarterectomy with saphenous vein patch angioplasty Iliofemoral embolectomy 15 French JP drain placement Prevena vacuum assisted dressing placement  SURGEON: Fonda FORBES Rim  ASSIST: Donnice Sender, PA  ANESTHESIA: General  EBL: 250 mL  INDICATIONS:    Benicio Manna is a 59 y.o. male who presented to the ED this morning with stroke.  He was taken for left ICA, MCA thrombectomy.  Case was complicated by femoral artery occlusion from access device.  After discussing the risks and benefits of right common femoral artery exploration, Rowyn's family elected to proceed.  FINDINGS:   Percutaneous access with closure device appreciated in the 10 o'clock position of the common femoral artery. Arteriotomy demonstrated long dissection on the medial aspect of the artery extending from the external iliac through the bifurcation and into the superficial femoral artery  TECHNIQUE:   Patient is brought to the OR laid in supine position.  General anesthesia was induced and patient's prepped and draped in standard fashion fashion.  The case began with ultrasound insonation of the right common femoral artery.  The bifurcation was marked, and an oblique incision made.  This was carried down to the artery.  There were numerous venous branches as well as lymph nodes in the dissection plane.  Lymph nodes were tied off, venous branches were also tied off.  Artery was exposed from the external iliac artery through the superficial femoral artery and profunda.  Branches were controlled with use of Vesseloops.  During the exposure, the Angio-Seal device was disrupted leading to bleeding.  Heparin  was administered and the artery was  clamped.    Longitudinal arteriotomy followed which demonstrated a long dissection flap extending from the external iliac artery through the common femoral bifurcation.  Iliofemoral endarterectomy followed.  Extending into the both the profunda and the superficial femoral artery.  Once completed, I moved to harvest saphenous vein.  Branches were ligated, vein was taken from the saphenofemoral junction.  Roughly 6 cm vein harvest.  The vein was then splayed open, valves lysed.  I performed an iliofemoral embolectomy using a #4 Fogarty embolectomy catheter prior to vein patch angioplasty.  After I completed the embolectomy, the vein was sewn to the artery in standard fashion using running 5-0 Prolene suture.  Prior to completion, the artery was backbled.  At completion, the patient had a palpable posterior tibial pulse.  The wound bed was irrigated with copious amounts of saline.  I elected to leave a 83 French JP drain due to the significant amount of lymph nodes present.  The wound bed was closed in layers using running Vicryl suture with staples and Prevena vacuum dressing at the level of the skin.   Fonda FORBES Rim, MD [facility] DATE OF DICTATION:   12/27/2024  "

## 2024-12-27 NOTE — Transfer of Care (Signed)
 Immediate Anesthesia Transfer of Care Note  Patient: Roy [name]  Procedure(s) Performed: RADIOLOGY WITH ANESTHESIA  Patient Location: PACU  Anesthesia Type:General  Level of Consciousness: drowsy  Airway & Oxygen Therapy: Patient Spontanous Breathing and Patient connected to face mask oxygen  Post-op Assessment: Report given to RN  Post vital signs: Reviewed and stable  Last Vitals:  Vitals Value Taken Time  BP 129/94 12/27/24 09:00  Temp    Pulse 54 12/27/24 09:08  Resp 13 12/27/24 09:08  SpO2 99 % 12/27/24 09:08  Vitals shown include unfiled device data.  Last Pain: There were no vitals filed for this visit.       Complications: No notable events documented.

## 2024-12-27 NOTE — H&P (Signed)
 See consult note

## 2024-12-27 NOTE — Progress Notes (Signed)
 During patient handoff / bedside report assessment of pulses with doppler with the PACU team was done and no pulses were heard in the rt foot (DP or TP). Rt femoral pulse palpable. DP and PT pulses are palpable in the lt foot none in the rt foot. Dr. Rosslyn notified. Doppler machines switched out for further assessment. No change.

## 2024-12-27 NOTE — Code Documentation (Signed)
 Responded to Code Stroke called at 0641 for L gaze, R sided paralysis, and aphasia, LSN-0605. Pt arrived at 0702, CBG-102, NIH-30, CT head-Hyperdense L MCA, no hemorrhage, CTA-L ICA occlusion in the neck with a long tapered appearance; either long segment thrombus or dissection. Terminus reconstitute from L Pcomm. L MCA origin occluded. TNK given at 0730. IR paged out at 0737 and pt txed to IR suite with 0743 arrival time. Plan: ICU admission after procedure.

## 2024-12-27 NOTE — Consult Note (Signed)
 " Hospital Consult    Reason for Consult: Right lower extremity acute limb ischemia Requesting Physician: Dr. Rosslyn MRN #:  [id number]  History of Present Illness: This is a 59 y.o. male with medical history outlined below who presented this morning with dense deficits on the right, left-sided ICA, MCA stroke requiring thrombectomy.  Case was complicated by access closure leading to vascular surgery consult for right lower extremity ischemia.  On exam, data was lying in bed.  Still with anesthesia on board, unable to participate.  Unsure the level of anesthesia versus stroke regarding current deficits.     Past Medical History:  Diagnosis Date   AIDS (acquired immune deficiency syndrome) (HCC)    Angioedema    ARF (acute renal failure)    Arthritis    Asthma    Bipolar disorder (HCC)    BPH (benign prostatic hyperplasia)    Bradycardia    Bronchitis    Cocaine abuse (HCC)    Coronary artery disease    Depression    Dyslipidemia    Dysrhythmia    1st degree heart block/ brady   Genital warts    GERD (gastroesophageal reflux disease)    Hepatitis C    treated   Hernia of abdominal wall    HIV (human immunodeficiency virus infection) (HCC)    HTN (hypertension)    Myocardial infarction (HCC) 2020   pt states stress test showed mild heart attack   Pre-diabetes    Septic hip (HCC)    SVT (supraventricular tachycardia)     Past Surgical History:  Procedure Laterality Date   APPLICATION OF WOUND VAC Right 09/01/2019   Procedure: APPLICATION OF WOUND VAC;  Surgeon: Kathlynn Sharper, MD;  Location: [facility] ORS;  Service: Orthopedics;  Laterality: Right;  Serial # [id number]   HERNIA REPAIR Left    inguinal   TOE SURGERY Right    TOTAL HIP ARTHROPLASTY Right 09/01/2019   Procedure: TOTAL HIP ARTHROPLASTY ANTERIOR APPROACH;  Surgeon: Kathlynn Sharper, MD;  Location: [facility] ORS;  Service: Orthopedics;  Laterality: Right;    Allergies[1]  Prior to Admission medications  Medication  Sig Start Date End Date Taking? Authorizing Provider  albuterol  (VENTOLIN  HFA) 108 (90 Base) MCG/ACT inhaler Inhale 1-2 puffs into the lungs every 4 (four) hours as needed for wheezing or shortness of breath. 05/23/20   Clapacs, Norleen DASEN, MD  benzonatate  (TESSALON ) 100 MG capsule Take 1 capsule (100 mg total) by mouth 3 (three) times daily as needed. 12/11/24   Cleaster Tinnie LABOR, PA-C  BIKTARVY  50-200-25 MG TABS tablet Take 1 tablet by mouth every morning. 01/14/24   Fayette Bodily, MD  cetirizine (ZYRTEC) 10 MG tablet Take 1 tablet by mouth daily. 04/29/23 04/28/24  [provider]  loperamide  (IMODIUM ) 2 MG capsule Take 1 capsule (2 mg total) by mouth as needed for diarrhea or loose stools. Patient not taking: Reported on 06/03/2024 05/04/24   Amin, Sumayya, MD  losartan  (COZAAR ) 100 MG tablet Take 1 tablet (100 mg total) by mouth every morning. Hold for next 3 days 05/04/24   Amin, Sumayya, MD  [Paused] mirtazapine  (REMERON ) 7.5 MG tablet Take 1 tablet (7.5 mg total) by mouth at bedtime. Patient not taking: Reported on 05/03/2024 Wait to take this until your doctor or other care provider tells you to start again. 11/02/20   Oneita Duwaine HERO, MD  ondansetron  (ZOFRAN -ODT) 4 MG disintegrating tablet Take 1 tablet (4 mg total) by mouth every 8 (eight) hours as needed for nausea or  vomiting. Patient not taking: Reported on 06/03/2024 05/04/24   Amin, Sumayya, MD  [Paused] risperiDONE  (RISPERDAL ) 1 MG tablet Take 1 tablet (1 mg total) by mouth at bedtime. Patient not taking: Reported on 05/03/2024 Wait to take this until your doctor or other care provider tells you to start again. 11/02/20   Oneita Duwaine HERO, MD  rosuvastatin  (CRESTOR ) 20 MG tablet Take 20 mg by mouth daily.    [provider]  traZODone  (DESYREL ) 100 MG tablet Take 1 tablet (100 mg total) by mouth at bedtime as needed for sleep. 06/08/24 07/08/24  Graham Krabbe, MD    Social History   Socioeconomic History    Marital status: Divorced    Spouse name: Not on file   Number of children: Not on file   Years of education: Not on file   Highest education level: Not on file  Occupational History   Not on file  Tobacco Use   Smoking status: Every Day    Current packs/day: 0.25    Average packs/day: 0.3 packs/day for 40.0 years (10.0 ttl pk-yrs)    Types: Cigarettes   Smokeless tobacco: Never  Vaping Use   Vaping status: Never Used  Substance and Sexual Activity   Alcohol use: Yes    Comment: 36 beers daily   Drug use: Yes    Types: Cocaine, Crack cocaine    Comment: 06/02/2024   Sexual activity: Yes  Other Topics Concern   Not on file  Social History Narrative   Not on file   Social Drivers of Health   Tobacco Use: High Risk (12/11/2024)   Patient History    Smoking Tobacco Use: Every Day    Smokeless Tobacco Use: Never    Passive Exposure: Not on file  Financial Resource Strain: Medium Risk (01/20/2024)   Received from [facility]   Overall Financial Resource Strain (CARDIA)    Difficulty of Paying Living Expenses: Somewhat hard  Food Insecurity: No Food Insecurity (06/03/2024)   Epic    Worried About Running Out of Food in the Last Year: Never true    Ran Out of Food in the Last Year: Never true  Transportation Needs: Unmet Transportation Needs (06/03/2024)   Epic    Lack of Transportation (Medical): Yes    Lack of Transportation (Non-Medical): Yes  Physical Activity: Not on file  Stress: Not on file  Social Connections: Not on file  Intimate Partner Violence: Not At Risk (06/03/2024)   Epic    Fear of Current or Ex-Partner: No    Emotionally Abused: No    Physically Abused: No    Sexually Abused: No  Depression (PHQ2-9): Low Risk (06/08/2024)   Depression (PHQ2-9)    PHQ-2 Score: 0  Recent Concern: Depression (PHQ2-9) - High Risk (06/07/2024)   Depression (PHQ2-9)    PHQ-2 Score: 11  Alcohol Screen: Not on file  Housing: Patient Declined (05/04/2024)    Housing Stability Vital Sign    Unable to Pay for Housing in the Last Year: Patient declined    Number of Times Moved in the Last Year: 0    Homeless in the Last Year: Patient declined  Utilities: At Risk (06/03/2024)   Epic    Threatened with loss of utilities: Already shut off  Health Literacy: Low Risk  (07/02/2022)   Received from [facility] Literacy    How often do you need to have someone help you when you read instructions, pamphlets, or other written  material from your doctor or pharmacy?: Never    Family History  Problem Relation Age of Onset   Cancer Brother    Uterine cancer Mother    CAD Mother    Hypertension Mother    Hyperlipidemia Mother     ROS: Otherwise negative unless mentioned in HPI  Physical Examination  Vitals:   12/27/24 0730 12/27/24 0745  BP: (!) 169/89 139/74  Pulse: (!) 47 (!) 59  Resp: 18 18  SpO2: 100% 100%   Body mass index is 26.44 kg/m.  General: Resting Gait: Not observed HENT: WNL, normocephalic Pulmonary: normal non-labored breathing, w Cardiac: regular Abdomen: soft, NT/ND, no masses Skin: without rashes Vascular Exam/Pulses: Palpable right groin pulse above the level of the access, no signal in the right lower extremity Extremities: without ischemic changes, without Gangrene , without cellulitis; without open wounds;  Musculoskeletal: no muscle wasting or atrophy  Neurologic: A&O X 3;  No focal weakness or paresthesias are detected; speech is fluent/normal Psychiatric:  The pt has Normal affect. Lymph:  Unremarkable  CBC    Component Value Date/Time   WBC 6.7 12/27/2024 0705   RBC 3.77 (L) 12/27/2024 0705   HGB 13.3 12/27/2024 0711   HGB 14.4 01/14/2024 1043   HCT 39.0 12/27/2024 0711   HCT 42.6 01/14/2024 1043   PLT 235 12/27/2024 0705   PLT 220 01/14/2024 1043   MCV 102.9 (H) 12/27/2024 0705   MCV 101 (H) 01/14/2024 1043   MCV 99 01/06/2015 1505   MCH 34.5 (H) 12/27/2024 0705   MCHC 33.5 12/27/2024  0705   RDW 12.1 12/27/2024 0705   RDW 11.9 01/14/2024 1043   RDW 13.0 01/06/2015 1505   LYMPHSABS 0.9 12/27/2024 0705   LYMPHSABS 1.6 01/14/2024 1043   LYMPHSABS 2.6 01/06/2015 1505   MONOABS 0.4 12/27/2024 0705   MONOABS 0.3 01/06/2015 1505   EOSABS 0.0 12/27/2024 0705   EOSABS 0.1 01/14/2024 1043   EOSABS 0.1 01/06/2015 1505   BASOSABS 0.0 12/27/2024 0705   BASOSABS 0.0 01/14/2024 1043   BASOSABS 0.0 01/06/2015 1505    BMET    Component Value Date/Time   NA 140 12/27/2024 0711   NA 141 01/06/2015 1505   K 3.6 12/27/2024 0711   K 3.9 01/06/2015 1505   CL 104 12/27/2024 0711   CL 107 01/06/2015 1505   CO2 25 12/27/2024 0705   CO2 29 01/06/2015 1505   GLUCOSE 93 12/27/2024 0711   GLUCOSE 111 (H) 01/06/2015 1505   BUN 22 (H) 12/27/2024 0711   BUN 16 01/06/2015 1505   CREATININE 1.40 (H) 12/27/2024 0711   CREATININE 1.28 01/06/2015 1505   CALCIUM  8.4 (L) 12/27/2024 0705   CALCIUM  9.0 01/06/2015 1505   GFRNONAA >60 12/27/2024 0705   GFRNONAA >60 01/06/2015 1505   GFRNONAA >60 05/10/2013 0535   GFRAA 54 (L) 07/12/2020 1511   GFRAA >60 01/06/2015 1505   GFRAA >60 05/10/2013 0535    COAGS: Lab Results  Component Value Date   INR 1.0 12/27/2024   INR 1.0 05/25/2020   INR 1.0 08/27/2019       ASSESSMENT/PLAN: This is a 59 y.o. male status post right sided access with thrombectomy for occluded left ICA, left MCA.  Case complicated by access closure using Angio-Seal device which has caused a flow-limiting lesion in the right common femoral artery.  I called family.  After discussing the risks and benefits of right groin exploration in an effort to improve inline flow to the right lower  extremity due to acute limb ischemia, they elected to proceed.   Fonda FORBES Rim MD MS [facility] [phone] 12/27/2024  9:23 AM     [1]  Allergies Allergen Reactions   Amlodipine  Swelling    Of the tongue   Lisinopril  Swelling   Statins Other (See  Comments)    Body aches   Bee Pollen Itching and Other (See Comments)    Itchy eyes and runny nose   Lactose Other (See Comments)    GI distress   Pollen Extract Other (See Comments)    Itchy eyes and runny nose   Yellow Jacket Venom Itching   "

## 2024-12-27 NOTE — Progress Notes (Signed)
 Dr. Rosslyn notified in PACU of absent pulses in the rt dorsalis pedis as well as the tibial pulse. Dr. Lanis in Vascular consulted. PACU team at the bedside as well during notification and consult.

## 2024-12-27 NOTE — Plan of Care (Signed)
" °  Problem: Ischemic Stroke/TIA Tissue Perfusion: Goal: Complications of ischemic stroke/TIA will be minimized Outcome: Progressing   Problem: Health Behavior/Discharge Planning: Goal: Ability to manage health-related needs will improve Outcome: Progressing Goal: Goals will be collaboratively established with patient/family Outcome: Progressing   Problem: Nutrition: Goal: Risk of aspiration will decrease Outcome: Progressing   Problem: Education: Goal: Knowledge of the prescribed therapeutic regimen will improve Outcome: Progressing   "

## 2024-12-27 NOTE — Anesthesia Preprocedure Evaluation (Signed)
"                                    Anesthesia Evaluation  Patient identified by MRN, date of birth, ID band Patient unresponsive    Reviewed: Allergy & Precautions, NPO status , [name] Chart, lab work & pertinent test results, Unable to perform ROS - Chart review onlyPreop documentation limited or incomplete due to emergent nature of procedure.  Airway Mallampati: II  TM Distance: >3 FB Neck ROM: Full    Dental  (+) Dental Advisory Given, Edentulous Lower, Edentulous Upper   Pulmonary asthma , Current Smoker and Patient abstained from smoking.   Pulmonary exam normal breath sounds clear to auscultation       Cardiovascular hypertension, + CAD and + Past MI  Normal cardiovascular exam+ dysrhythmias Supra Ventricular Tachycardia  Rhythm:Regular Rate:Normal     Neuro/Psych  PSYCHIATRIC DISORDERS  Depression Bipolar Disorder   CVA, Residual Symptoms    GI/Hepatic ,GERD  ,,(+)     substance abuse  alcohol use and cocaine use, Hepatitis -, C  Endo/Other  negative endocrine ROS    Renal/GU Renal disease (AKI)     Musculoskeletal  (+) Arthritis ,    Abdominal   Peds  Hematology  (+) HIV  Anesthesia Other Findings Day of surgery medications reviewed with the patient.  Reproductive/Obstetrics                              Anesthesia Physical Anesthesia Plan  ASA: 4 and emergent  Anesthesia Plan: General   Post-op Pain Management:    Induction: Intravenous, Rapid sequence and Cricoid pressure planned  PONV Risk Score and Plan: 1 and Dexamethasone  and Ondansetron   Airway Management Planned: Oral ETT  Additional Equipment:   Intra-op Plan:   Post-operative Plan: Possible Post-op intubation/ventilation  Informed Consent:      History available from chart only and Only emergency history available  Plan Discussed with: CRNA  Anesthesia Plan Comments: (EMERGENCY CONSENT. Pre-op evaluation completed after induction of  anesthesia due to emergent nature of procedure.)        Anesthesia Quick Evaluation  "

## 2024-12-28 ENCOUNTER — Inpatient Hospital Stay (HOSPITAL_COMMUNITY): Payer: MEDICAID

## 2024-12-28 ENCOUNTER — Encounter (HOSPITAL_COMMUNITY): Payer: Self-pay | Admitting: Vascular Surgery

## 2024-12-28 DIAGNOSIS — F17210 Nicotine dependence, cigarettes, uncomplicated: Secondary | ICD-10-CM | POA: Diagnosis not present

## 2024-12-28 DIAGNOSIS — R233 Spontaneous ecchymoses: Secondary | ICD-10-CM | POA: Diagnosis not present

## 2024-12-28 DIAGNOSIS — R29716 NIHSS score 16: Secondary | ICD-10-CM

## 2024-12-28 DIAGNOSIS — F1410 Cocaine abuse, uncomplicated: Secondary | ICD-10-CM

## 2024-12-28 DIAGNOSIS — I779 Disorder of arteries and arterioles, unspecified: Secondary | ICD-10-CM

## 2024-12-28 DIAGNOSIS — I63512 Cerebral infarction due to unspecified occlusion or stenosis of left middle cerebral artery: Secondary | ICD-10-CM | POA: Diagnosis not present

## 2024-12-28 DIAGNOSIS — E785 Hyperlipidemia, unspecified: Secondary | ICD-10-CM

## 2024-12-28 DIAGNOSIS — I10 Essential (primary) hypertension: Secondary | ICD-10-CM | POA: Diagnosis not present

## 2024-12-28 DIAGNOSIS — B20 Human immunodeficiency virus [HIV] disease: Secondary | ICD-10-CM | POA: Diagnosis not present

## 2024-12-28 DIAGNOSIS — I6389 Other cerebral infarction: Secondary | ICD-10-CM

## 2024-12-28 DIAGNOSIS — I69391 Dysphagia following cerebral infarction: Secondary | ICD-10-CM | POA: Diagnosis not present

## 2024-12-28 DIAGNOSIS — G936 Cerebral edema: Secondary | ICD-10-CM | POA: Diagnosis not present

## 2024-12-28 DIAGNOSIS — Z9889 Other specified postprocedural states: Secondary | ICD-10-CM

## 2024-12-28 LAB — PHOSPHORUS: Phosphorus: 2.2 mg/dL — ABNORMAL LOW (ref 2.5–4.6)

## 2024-12-28 LAB — LIPID PANEL
Cholesterol: 117 mg/dL (ref 0–200)
HDL: 46 mg/dL
LDL Cholesterol: 55 mg/dL (ref 0–99)
Total CHOL/HDL Ratio: 2.6 ratio
Triglycerides: 85 mg/dL
VLDL: 17 mg/dL (ref 0–40)

## 2024-12-28 LAB — CBC
HCT: 31.5 % — ABNORMAL LOW (ref 39.0–52.0)
Hemoglobin: 10.8 g/dL — ABNORMAL LOW (ref 13.0–17.0)
MCH: 34.1 pg — ABNORMAL HIGH (ref 26.0–34.0)
MCHC: 34.3 g/dL (ref 30.0–36.0)
MCV: 99.4 fL (ref 80.0–100.0)
Platelets: 169 K/uL (ref 150–400)
RBC: 3.17 MIL/uL — ABNORMAL LOW (ref 4.22–5.81)
RDW: 12.3 % (ref 11.5–15.5)
WBC: 9 K/uL (ref 4.0–10.5)
nRBC: 0 % (ref 0.0–0.2)

## 2024-12-28 LAB — ECHOCARDIOGRAM COMPLETE
AR max vel: 3.03 cm2
AV Area VTI: 2.93 cm2
AV Area mean vel: 3.04 cm2
AV Mean grad: 6 mmHg
AV Peak grad: 10 mmHg
Ao pk vel: 1.58 m/s
Area-P 1/2: 2.48 cm2
S' Lateral: 4.1 cm
Weight: 2864.22 [oz_av]

## 2024-12-28 LAB — MAGNESIUM: Magnesium: 1.8 mg/dL (ref 1.7–2.4)

## 2024-12-28 LAB — BASIC METABOLIC PANEL WITH GFR
Anion gap: 10 (ref 5–15)
BUN: 15 mg/dL (ref 6–20)
CO2: 21 mmol/L — ABNORMAL LOW (ref 22–32)
Calcium: 8.2 mg/dL — ABNORMAL LOW (ref 8.9–10.3)
Chloride: 107 mmol/L (ref 98–111)
Creatinine, Ser: 1.1 mg/dL (ref 0.61–1.24)
GFR, Estimated: >60 mL/min
Glucose, Bld: 102 mg/dL — ABNORMAL HIGH (ref 70–99)
Potassium: 3.9 mmol/L (ref 3.5–5.1)
Sodium: 138 mmol/L (ref 135–145)

## 2024-12-28 MED ORDER — ASPIRIN 300 MG RE SUPP
300.0000 mg | Freq: Every day | RECTAL | Status: AC
  Filled 2024-12-28 (×6): qty 1

## 2024-12-28 MED ORDER — ASPIRIN 81 MG PO CHEW
81.0000 mg | CHEWABLE_TABLET | Freq: Every day | ORAL | Status: AC
  Administered 2024-12-28 – 2025-01-22 (×26): 81 mg via ORAL
  Filled 2024-12-28 (×27): qty 1

## 2024-12-28 MED ORDER — HEPARIN SODIUM (PORCINE) 5000 UNIT/ML IJ SOLN
5000.0000 [IU] | Freq: Three times a day (TID) | INTRAMUSCULAR | Status: AC
  Administered 2024-12-28 – 2025-01-22 (×76): 5000 [IU] via SUBCUTANEOUS
  Filled 2024-12-28 (×75): qty 1

## 2024-12-28 NOTE — Progress Notes (Signed)

## 2024-12-28 NOTE — Progress Notes (Addendum)
 STROKE TEAM PROGRESS NOTE    SIGNIFICANT HOSPITAL EVENTS 1/11: Patient admitted with aphasia and right-sided weakness, given TNK and mechanical thrombectomy performed.  INTERIM HISTORY/SUBJECTIVE  Patient remains hemodynamically stable and afebrile overnight.  He continues to have receptive and expressive aphasia and right sided weakness. Speech therapy plan to do modified barium swallow eval later today.  I met with multiple family members after around and answered their questions OBJECTIVE  CBC    Component Value Date/Time   WBC 9.0 12/28/2024 0510   RBC 3.17 (L) 12/28/2024 0510   HGB 10.8 (L) 12/28/2024 0510   HGB 14.4 01/14/2024 1043   HCT 31.5 (L) 12/28/2024 0510   HCT 42.6 01/14/2024 1043   PLT 169 12/28/2024 0510   PLT 220 01/14/2024 1043   MCV 99.4 12/28/2024 0510   MCV 101 (H) 01/14/2024 1043   MCV 99 01/06/2015 1505   MCH 34.1 (H) 12/28/2024 0510   MCHC 34.3 12/28/2024 0510   RDW 12.3 12/28/2024 0510   RDW 11.9 01/14/2024 1043   RDW 13.0 01/06/2015 1505   LYMPHSABS 0.9 12/27/2024 0705   LYMPHSABS 1.6 01/14/2024 1043   LYMPHSABS 2.6 01/06/2015 1505   MONOABS 0.4 12/27/2024 0705   MONOABS 0.3 01/06/2015 1505   EOSABS 0.0 12/27/2024 0705   EOSABS 0.1 01/14/2024 1043   EOSABS 0.1 01/06/2015 1505   BASOSABS 0.0 12/27/2024 0705   BASOSABS 0.0 01/14/2024 1043   BASOSABS 0.0 01/06/2015 1505    BMET    Component Value Date/Time   NA 138 12/28/2024 0510   NA 141 01/06/2015 1505   K 3.9 12/28/2024 0510   K 3.9 01/06/2015 1505   CL 107 12/28/2024 0510   CL 107 01/06/2015 1505   CO2 21 (L) 12/28/2024 0510   CO2 29 01/06/2015 1505   GLUCOSE 102 (H) 12/28/2024 0510   GLUCOSE 111 (H) 01/06/2015 1505   BUN 15 12/28/2024 0510   BUN 16 01/06/2015 1505   CREATININE 1.10 12/28/2024 0510   CREATININE 1.28 01/06/2015 1505   CALCIUM  8.2 (L) 12/28/2024 0510   CALCIUM  9.0 01/06/2015 1505   GFRNONAA >60 12/28/2024 0510   GFRNONAA >60 01/06/2015 1505   GFRNONAA >60  05/10/2013 0535    IMAGING past 24 hours ECHOCARDIOGRAM COMPLETE Result Date: 12/28/2024    ECHOCARDIOGRAM REPORT   Patient Name:   Roy [name] [facility]. Date of Exam: 12/28/2024 Medical Rec #:  [id number]              Height:       69.0 in Accession #:    [id number]             Weight:       179.0 lb Date of Birth:  02-04-1966             BSA:          1.971 m Patient Age:    58 years               BP:           138/86 mmHg Patient Gender: M                      HR:           55 bpm. Exam Location:  Inpatient Procedure: 2D Echo, Cardiac Doppler and Color Doppler (Both Spectral and Color            Flow Doppler were utilized during procedure). Indications:    Stroke  I63.9  History:        Patient has prior history of Echocardiogram examinations, most                 recent 01/23/2019. CAD and Previous Myocardial Infarction; Risk                 Factors:Hypertension.  Sonographer:    Jayson Gaskins Referring Phys: [id number] MATTHEW EVELAND IMPRESSIONS  1. Left ventricular ejection fraction, by estimation, is 50 to 55%. The left ventricle has low normal function. The left ventricle has no regional wall motion abnormalities. Left ventricular diastolic parameters were normal.  2. Right ventricular systolic function is normal. The right ventricular size is normal.  3. Left atrial size was moderately dilated.  4. Right atrial size was mildly dilated.  5. The mitral valve is grossly normal. Trivial mitral valve regurgitation. No evidence of mitral stenosis.  6. The aortic valve is grossly normal. There is mild calcification of the aortic valve. Aortic valve regurgitation is trivial.  7. The inferior vena cava is normal in size with greater than 50% respiratory variability, suggesting right atrial pressure of 3 mmHg. Comparison(s): Prior images reviewed side by side. Conclusion(s)/Recommendation(s): Otherwise normal echocardiogram, with minor abnormalities described in the report. No intracardiac source of embolism detected  on this transthoracic study. Consider a transesophageal echocardiogram to exclude cardiac source of embolism if clinically indicated. FINDINGS  Left Ventricle: Left ventricular ejection fraction, by estimation, is 50 to 55%. The left ventricle has low normal function. The left ventricle has no regional wall motion abnormalities. The left ventricular internal cavity size was normal in size. There is no left ventricular hypertrophy. Left ventricular diastolic parameters were normal. Right Ventricle: The right ventricular size is normal. Right vetricular wall thickness was not well visualized. Right ventricular systolic function is normal. Left Atrium: Left atrial size was moderately dilated. Right Atrium: Right atrial size was mildly dilated. Pericardium: There is no evidence of pericardial effusion. Mitral Valve: The mitral valve is grossly normal. Trivial mitral valve regurgitation. No evidence of mitral valve stenosis. Tricuspid Valve: The tricuspid valve is grossly normal. Tricuspid valve regurgitation is trivial. No evidence of tricuspid stenosis. Aortic Valve: The aortic valve is grossly normal. There is mild calcification of the aortic valve. Aortic valve regurgitation is trivial. Aortic valve mean gradient measures 6.0 mmHg. Aortic valve peak gradient measures 10.0 mmHg. Aortic valve area, by VTI measures 2.93 cm. Pulmonic Valve: The pulmonic valve was not well visualized. Pulmonic valve regurgitation is not visualized. No evidence of pulmonic stenosis. Aorta: The aortic root is normal in size and structure and the ascending aorta was not well visualized. Venous: The inferior vena cava is normal in size with greater than 50% respiratory variability, suggesting right atrial pressure of 3 mmHg. IAS/Shunts: The atrial septum is grossly normal.  LEFT VENTRICLE PLAX 2D LVIDd:         5.50 cm   Diastology LVIDs:         4.10 cm   LV e' medial:    8.05 cm/s LV PW:         0.90 cm   LV E/e' medial:  12.2 LV IVS:         1.00 cm   LV e' lateral:   9.79 cm/s LVOT diam:     2.05 cm   LV E/e' lateral: 10.0 LV SV:         87 LV SV Index:   44 LVOT Area:  3.30 cm  RIGHT VENTRICLE RV S prime:     15.90 cm/s TAPSE (M-mode): 3.6 cm LEFT ATRIUM             Index        RIGHT ATRIUM           Index LA Vol (A2C):   80.8 ml 41.00 ml/m  RA Area:     20.30 cm LA Vol (A4C):   82.8 ml 42.01 ml/m  RA Volume:   58.00 ml  29.43 ml/m LA Biplane Vol: 88.8 ml 45.06 ml/m  AORTIC VALVE AV Area (Vmax):    3.03 cm AV Area (Vmean):   3.04 cm AV Area (VTI):     2.93 cm AV Vmax:           158.00 cm/s AV Vmean:          114.000 cm/s AV VTI:            0.299 m AV Peak Grad:      10.0 mmHg AV Mean Grad:      6.0 mmHg LVOT Vmax:         145.00 cm/s LVOT Vmean:        105.000 cm/s LVOT VTI:          0.265 m LVOT/AV VTI ratio: 0.89  AORTA Ao Root diam: 3.60 cm MITRAL VALVE MV Area (PHT): 2.48 cm     SHUNTS MV Decel Time: 306 msec     Systemic VTI:  0.26 m MV E velocity: 98.00 cm/s   Systemic Diam: 2.05 cm MV A velocity: 106.00 cm/s MV E/A ratio:  0.92 Shelda Bruckner MD Electronically signed by Shelda Bruckner MD Signature Date/Time: 12/28/2024/1:29:22 PM    Final    MR BRAIN WO CONTRAST Result Date: 12/28/2024 EXAM: MRI BRAIN WITHOUT CONTRAST 12/28/2024 05:36:51 AM TECHNIQUE: Multiplanar multisequence MRI of the head/brain was performed without the administration of intravenous contrast. COMPARISON: CT head and CTA head and neck yesterday. CLINICAL HISTORY: 59 year old male. Code stroke presentation yesterday with left ICA occlusion in the neck, left MCA occlusion. Status post endovascular thrombectomy. FINDINGS: BRAIN AND VENTRICLES: Widespread heterogeneous diffusion restriction in the left MCA territory from the left basal ganglia through to the insula and operculum (series 5 image 77). Scattered additional cortical encephalomalacia extending subclad in both the anterior and superior division territory (series 5 image 87). Confluent  petechial hemorrhage in the left basal ganglia. No significant associated mass effect from the hemorrhage itself. T2 and FLAIR hyperintense cytotoxic edema which does result in mild mass effect on the left lateral ventricle. Trace rightward midline shift. No ventriculomegaly. Basilar cisterns are patent. No right hemisphere or posterior fossa diffusion restriction. Background gray and white matter signal appears normal for age. Abnormal left ICA flow void below the skull base. Left ICA siphon flow void appears more symmetric. Left MCA flow void is visible. Other major vascular flow voids are maintained. The sella is unremarkable. ORBITS: No significant abnormality. SINUSES AND MASTOIDS: Mild paranasal sinus mucosal thickening and fluid. Mastoids are clear. BONES AND SOFT TISSUES: Normal marrow signal. No significant soft tissue abnormality. HEIDELBERG BLEEDING CLASSIFICATION: 1b; HI2 if confluent petechiae, no mass effect IMPRESSION: 1. Acute left MCA territory infarct, confluent at the left basal ganglia, insula and operculum. 1b; HI2 confluent petechial hemorrhage. Cytotoxic edema mild regional mass effect and trace rightward midline shift. 2. Cervical left ICA flow void below the skull base remains abnormal. Electronically signed by: Helayne Hurst MD MD 12/28/2024 06:14 AM EST  RP Workstation: [id number]   DG OR LOCAL ABDOMEN Result Date: 12/27/2024 CLINICAL DATA:  Missing surgical sponge during hip surgery. Evaluate for foreign body. EXAM: OR LOCAL ABDOMEN COMPARISON:  None Available. FINDINGS: Mild diffuse generalized gaseous distension of stomach, small, and large bowel is seen, consistent with mild ileus. Foley catheter seen in place with small amount of contrast in the urinary bladder. Right hip prosthesis is seen. Overlying skin staples and surgical drain noted. No retained surgical sponge or other radiopaque instrument seen. Severe left hip osteoarthritis noted. IMPRESSION: Right hip prosthesis in  appropriate position. Overlying surgical drains and skin staples noted. No surgical sponge or other radiopaque instrument identified. Mild ileus pattern. Electronically Signed   By: Norleen DELENA Kil M.D.   On: 12/27/2024 14:49    Vitals:   12/28/24 0900 12/28/24 1000 12/28/24 1100 12/28/24 1200  BP: (!) 150/83 (!) 146/82 124/79 133/77  Pulse: (!) 55 64 (!) 47 (!) 44  Resp: (!) 21 15 17 16   Temp:    98.7 F (37.1 C)  TempSrc:    Oral  SpO2: 98% 94% 96% 100%  Weight:         PHYSICAL EXAM General:  Alert, well-nourished, well-developed patient in no acute distress Psych:  Mood and affect appropriate for situation CV: Regular rate and rhythm on monitor Respiratory:  Regular, unlabored respirations on room air   NEURO:  Mental Status: Patient remains globally aphasic but responds to name and intermittently follows simple commands.  He will mimic Speech/Language: No verbal output  Cranial Nerves:  II: PERRL.  III, IV, VI: Left gaze deviation, able to go to midline VII: Face is symmetrical resting  VIII: hearing intact to voice. XII: tongue is midline without fasciculations. Motor: Able to move left upper and lower extremities with antigravity strength, no movement of right upper extremity, moves right lower extremity but will not lift it off the bed Tone: is normal and bulk is normal Sensation- Intact to light touch bilaterally.  Coordination: Unable to perform Gait- deferred  Most Recent NIH  1a Level of Conscious.: 0 1b LOC Questions: 2 1c LOC Commands: 1 2 Best Gaze: 1 3 Visual: 0 4 Facial Palsy: 0 5a Motor Arm - left: 0 5b Motor Arm - Right: 4 6a Motor Leg - Left: 0 6b Motor Leg - Right: 3 7 Limb Ataxia: 0 8 Sensory: 0 9 Best Language: 3 10 Dysarthria: 2 11 Extinct. and Inatten.: 0 TOTAL: 16   ASSESSMENT/PLAN  Roy [name] is a 59 y.o. male with history of HIV, hypertension, substance abuse and alcohol abuse admitted for acute onset right-sided  weakness and aphasia.  He was given TNK to treat stroke and was found to have occlusion of left ICA/MCA, and mechanical thrombectomy was successfully performed.  NIH on Admission 30  Acute Ischemic Infarct:  left MCA territory infarct s/p mechanical thrombectomy and TNK Etiology: Large vessel occlusion Code Stroke CT head hyperdense left MCA ASPECTS 7 CTA head & neck left ICA occlusion in the neck, left MCA origin occluded MRI acute left MCA territory infarct with confluent petechial hemorrhage, cytotoxic edema and mild regional mass effect with trace rightward midline shift 2D Echo EF 50 to 55%, moderately dilated left atrium, mildly dilated right atrium, grossly normal atrial septum Will likely need loop recorder at discharge LDL 55 HgbA1c 5.8 VTE prophylaxis -SCDs No antithrombotic prior to admission, now on aspirin  81 mg daily due to petechial hemorrhage Therapy recommendations:  Pending Disposition: Pending  Hypertension Home  meds: Losartan  100 mg daily Stable Blood Pressure Goal: SBP 120-160 for first 24 hours then less than 180   Hyperlipidemia Home meds: Rosuvastatin  20 mg daily, resumed in hospital LDL 55, goal < 70 Continue statin at discharge  Tobacco Abuse Patient smokes 0.25 packs per day for 40 years      Ready to quit? N/A Nicotine  replacement therapy provided  Substance Abuse Patient uses cocaine UDS positive for Cocaine      Ready to quit? N/A TOC consult for cessation placed  Dysphagia Patient has post-stroke dysphagia, SLP consulted    Diet   Diet NPO time specified Except for: Sips with Meds   Advance diet as tolerated  Other Stroke Risk Factors None   Other Active Problems HIV-continue home Biktarvy   Hospital day # 1  Patient seen by NP with MD, MD to edit note as needed. Cortney E Everitt Clint Kill , MSN, AGACNP-BC [facility] See Amion for schedule and pager information 12/28/2024 2:21 PM   I have personally obtained  history,examined this patient, reviewed notes, independently viewed imaging studies, participated in medical decision making and plan of care.ROS completed by me personally and pertinent positives fully documented  I have made any additions or clarifications directly to the above note. Agree with note above.  Patient remains aphasic with right Hemi plegia.  Speech therapy for swallow eval today and if he fails may need a core track tube.  Mobilize out of bed.  Continue ongoing therapies.  Long discussion with patient and multiple family members at the bedside and answered questions.  Appreciate vascular surgery help.  He will likely need inpatient rehab. This patient is critically ill and at significant risk of neurological worsening, death and care requires constant monitoring of vital signs, hemodynamics,respiratory and cardiac monitoring, extensive review of multiple databases, frequent neurological assessment, discussion with family, other specialists and medical decision making of high complexity.I have made any additions or clarifications directly to the above note.This critical care time does not reflect procedure time, or teaching time or supervisory time of PA/NP/Med Resident etc but could involve care discussion time.  I spent 30 minutes of neurocritical care time  in the care of  this patient.      Eather Popp, MD Medical Director [facility] Pager: [phone] 12/28/2024 3:51 PM   To contact Stroke Continuity provider, please refer to [email or web address]. After hours, contact General Neurology

## 2024-12-28 NOTE — Progress Notes (Signed)
 PT Cancellation Note  Patient Details Name: Roy [name] MRN: [id number] DOB: May 25, 1966   Cancelled Treatment:    Reason Eval/Treat Not Completed: Active bedrest order   [name] 12/28/2024, 7:15 AM [name], PT Acute Rehabilitation Services Office: [phone]

## 2024-12-28 NOTE — Progress Notes (Signed)
 OT Cancellation Note  Patient Details Name: Roy [name] MRN: [id number] DOB: 1966-01-18   Cancelled Treatment:    Reason Eval/Treat Not Completed: Active bedrest order  [name] 12/28/2024, 7:51 AM

## 2024-12-28 NOTE — TOC CM/SW Note (Signed)
 Transition of Care [facility]) - Inpatient Brief Assessment   Patient Details  Name: Roy [name] MRN: [id number] Date of Birth: 1966-07-26  Transition of Care [facility]) CM/SW Contact:    Jullianna Gabor M, RN Phone Number: 12/28/2024, 4:38 PM   Clinical Narrative: 59 yo M adm 12/27/24 from ETOH rehab center after fall with Rt weakness, AMS. Pt with Lt ICA/MCA CVA s/p Lt ICA/MCA thrombectomy complicated by Rt CFA occlusion from closure device requiring exploration and endarterectomy with angioplasty.  PT/OT and ST recommending CIR and consult requested. Patient has a sister April who provides assistance for patient.  Will follow as patient progresses.     Transition of Care Asessment: Insurance and Status: Insurance coverage has been reviewed Patient has primary care physician: Yes [facility]) Home environment has been reviewed: Lives alone in apartment Prior level of function:: Independent Prior/Current Home Services: No current home services Social Drivers of Health Review: SDOH reviewed needs interventions Readmission risk has been reviewed: Yes Transition of care needs: transition of care needs identified, TOC will continue to follow  Mliss MICAEL Fass, RN, BSN  Trauma/Neuro ICU Case Manager [phone]

## 2024-12-28 NOTE — Progress Notes (Addendum)
 Modified Barium Swallow Study  Patient Details  Name: Roy [name] MRN: [id number] Date of Birth: [birth year]  Today's Date: 12/28/2024  Modified Barium Swallow completed.  Full report located under Chart Review in the Imaging Section.  History of Present Illness 59 yo male presenting to ED 1/11 from EtOH rehab center with R sided weakness and AMS s/p fall. CTA shows proximal L MCA and ICA occlusion s/p TNK and thrombectomy. Complicated by R CFA occlusion from closure device requiring exploration and endarterectomy with angioplasty. MRI shows acute L MCA territory infarct, confluent at the L basal ganglia, insula, and operculum as well as ongoing abnormal L ICA flow below the skull base. PMH includes HIV, history of hepatitis C, bipolar disorder, cocaine and EtOH abuse   Clinical Impression Pt exhibits primary oral dysphagia with mild pharyngeal deficits. He is edentulous and it is unclear if he wears dentures at baseline. This, in addition to R CN V, VII, and XII deficits are suspected to contribute to prolonged mastication and moderate anterior loss. There is a collection of oral and pharyngeal residue across all consistencies that consistently clears with subswallows. Anterior hyoid movement and epiglottic inversion are partial but complete laryngeal elevation closes the laryngeal vestibule completely at the height of the swallow. Trace penetration occurs with both thin and nectar thick liquids but stays above the vocal folds and is eventually expelled as the study progresses (PAS 2). Even when having difficulty with orally managing the 13 mm barium tablet with thin liquids, the bolus did not progress lower in the laryngeal vestibule. Note frequent coughing without related aspiration. To clear the tablet from his oral cavity, subsequent bites of purees were given but it then became transiently lodged in the pyriform sinuses. Once the tablet cleared from the oropharynx, no esophageal retention  was noted. Recommend Dys 1 diet with thin liquids. Meds can be given whole with puree (crush if larger). SLP will f/u.   Factors that may increase risk of adverse event in presence of aspiration Noe & Lianne 2021): Reduced cognitive function  Swallow Evaluation Recommendations Recommendations: PO diet PO Diet Recommendation: Dysphagia 1 (Pureed);Thin liquids (Level 0) Liquid Administration via: Cup;Straw Medication Administration: Whole meds with puree Supervision: Full supervision/cueing for swallowing strategies;Staff to assist with self-feeding Swallowing strategies  : Check for pocketing or oral holding;Check for anterior loss Postural changes: Position pt fully upright for meals Oral care recommendations: Oral care BID (2x/day)    Damien Blumenthal, M.A., CCC-SLP Speech Language Pathology, Acute Rehabilitation Services  Secure Chat preferred [phone]  12/28/2024,4:07 PM

## 2024-12-28 NOTE — Evaluation (Signed)
 Clinical/Bedside Swallow Evaluation Patient Details  Name: Roy [name] MRN: [id number] Date of Birth: 09-07-1966  Today's Date: 12/28/2024 Time: SLP Start Time (ACUTE ONLY): 0948 SLP Stop Time (ACUTE ONLY): 1000 SLP Time Calculation (min) (ACUTE ONLY): 12 min  Past Medical History:  Past Medical History:  Diagnosis Date   AIDS (acquired immune deficiency syndrome) (HCC)    Angioedema    ARF (acute renal failure)    Arthritis    Asthma    Bipolar disorder (HCC)    BPH (benign prostatic hyperplasia)    Bradycardia    Bronchitis    Cocaine abuse (HCC)    Coronary artery disease    Depression    Dyslipidemia    Dysrhythmia    1st degree heart block/ brady   Genital warts    GERD (gastroesophageal reflux disease)    Hepatitis C    treated   Hernia of abdominal wall    HIV (human immunodeficiency virus infection) (HCC)    HTN (hypertension)    Myocardial infarction (HCC) 2020   pt states stress test showed mild heart attack   Pre-diabetes    Septic hip (HCC)    SVT (supraventricular tachycardia)    Past Surgical History:  Past Surgical History:  Procedure Laterality Date   APPLICATION OF WOUND VAC Right 09/01/2019   Procedure: APPLICATION OF WOUND VAC;  Surgeon: Kathlynn Sharper, MD;  Location: [facility] ORS;  Service: Orthopedics;  Laterality: Right;  Serial # [id number]   APPLICATION OF WOUND VAC Right 12/27/2024   Procedure: RIGHT GROIN PREVENA APPLICATION, WOUND VAC;  Surgeon: Lanis Fonda BRAVO, MD;  Location: [facility] OR;  Service: Vascular;  Laterality: Right;   ENDARTERECTOMY FEMORAL Right 12/27/2024   Procedure: RIGHT FEMORAL ENDARTERECTOMY,L;  Surgeon: Lanis Fonda BRAVO, MD;  Location: [facility] OR;  Service: Vascular;  Laterality: Right;   HERNIA REPAIR Left    inguinal   PATCH ANGIOPLASTY Right 12/27/2024   Procedure: PATCH ANGIOPLASTY, USING PATCH GREATER SAPHENOUS VEIN;  Surgeon: Lanis Fonda BRAVO, MD;  Location: [facility] OR;  Service: Vascular;  Laterality: Right;   RADIOLOGY WITH  ANESTHESIA N/A 12/27/2024   Procedure: RADIOLOGY WITH ANESTHESIA;  Surgeon: Radiologist, Medication, MD;  Location: [facility] OR;  Service: Radiology;  Laterality: N/A;   THROMBECTOMY FEMORAL ARTERY Right 12/27/2024   Procedure: RIGHT FEMORAL ARTERY THROMBECTOMY;  Surgeon: Lanis Fonda BRAVO, MD;  Location: [facility] OR;  Service: Vascular;  Laterality: Right;   TOE SURGERY Right    TOTAL HIP ARTHROPLASTY Right 09/01/2019   Procedure: TOTAL HIP ARTHROPLASTY ANTERIOR APPROACH;  Surgeon: Kathlynn Sharper, MD;  Location: [facility] ORS;  Service: Orthopedics;  Laterality: Right;   VEIN HARVEST Right 12/27/2024   Procedure: RIGHT GREATER SAPHENOUS VEIN HARVEST, SURGICAL PROCUREMENT, VEIN;  Surgeon: Lanis Fonda BRAVO, MD;  Location: [facility] OR;  Service: Vascular;  Laterality: Right;   HPI:  59 yo male presenting to ED 1/11 from [facility] with R sided weakness and AMS s/p fall. CTA shows proximal L MCA and ICA occlusion s/p TNK and thrombectomy. Complicated by R CFA occlusion from closure device requiring exploration and endarterectomy with angioplasty. MRI shows acute L MCA territory infarct, confluent at the L basal ganglia, insula, and operculum as well as ongoing abnormal L ICA flow below the skull base. PMH includes HIV, history of hepatitis C, bipolar disorder, cocaine and EtOH abuse    Assessment / Plan / Recommendation  Clinical Impression  Given difficulty challenging pt with large volumes due to language deficits and acute changes to his  sensory/strength, will proceed with an MBS for further assessment. Can offer ice chips in moderation with frequent oral care and meds can be given crushed in puree.   Pt presents with moderate R CN VII and XII deficits affecting facial and lingual strength and symmetry. This contributes to anterior loss and R lateral sulcus residue with POs. Due to language deficits, he was unable to be challenged with large volumes of thin liquids. Consecutive sips were infrequently followed by delayed  coughing.  SLP Visit Diagnosis: Dysphagia, unspecified (R13.10)    Aspiration Risk  Mild aspiration risk    Diet Recommendation           Other Recommendations Oral Care Recommendations: Oral care QID;Oral care prior to ice chip/H20     Swallow Evaluation Recommendations Recommendations: NPO except meds;Ice chips PRN after oral care Medication Administration: Crushed with puree Oral care recommendations: Oral care QID (4x/day);Oral care before ice chips/water   Assistance Recommended at Discharge    Functional Status Assessment Patient has had a recent decline in their functional status and demonstrates the ability to make significant improvements in function in a reasonable and predictable amount of time.  Frequency and Duration min 2x/week  2 weeks       Prognosis Prognosis for improved oropharyngeal function: Good Barriers to Reach Goals: Language deficits      Swallow Study   General HPI: 59 yo male presenting to ED 1/11 from [facility] center with R sided weakness and AMS s/p fall. CTA shows proximal L MCA and ICA occlusion s/p TNK and thrombectomy. Complicated by R CFA occlusion from closure device requiring exploration and endarterectomy with angioplasty. MRI shows acute L MCA territory infarct, confluent at the L basal ganglia, insula, and operculum as well as ongoing abnormal L ICA flow below the skull base. PMH includes HIV, history of hepatitis C, bipolar disorder, cocaine and EtOH abuse Type of Study: Bedside Swallow Evaluation Previous Swallow Assessment: none in chart Diet Prior to this Study: NPO Temperature Spikes Noted: No Respiratory Status: Room air History of Recent Intubation: No Behavior/Cognition: Alert;Cooperative;Requires cueing Oral Cavity Assessment: Within Functional Limits Oral Care Completed by SLP: No Oral Cavity - Dentition: Edentulous Vision: Functional for self-feeding Self-Feeding Abilities: Able to feed self;Needs assist Patient  Positioning: Upright in bed Baseline Vocal Quality: Normal Volitional Cough: Strong Volitional Swallow: Able to elicit    Oral/Motor/Sensory Function Overall Oral Motor/Sensory Function: Moderate impairment Facial ROM: Reduced right;Suspected CN VII (facial) dysfunction Facial Symmetry: Abnormal symmetry right;Suspected CN VII (facial) dysfunction Facial Strength: Reduced right;Suspected CN VII (facial) dysfunction Facial Sensation: Reduced right;Suspected CN V (Trigeminal) dysfunction Lingual ROM: Reduced right;Suspected CN XII (hypoglossal) dysfunction Lingual Symmetry: Abnormal symmetry right;Suspected CN XII (hypoglossal) dysfunction Lingual Strength: Reduced;Suspected CN XII (hypoglossal) dysfunction   Ice Chips Ice chips: Within functional limits Presentation: Spoon   Thin Liquid Thin Liquid: Impaired Presentation: Straw;Cup;Self Fed Oral Phase Impairments: Reduced labial seal Oral Phase Functional Implications: Oral holding Pharyngeal  Phase Impairments: Suspected delayed Swallow;Multiple swallows;Cough - Delayed    Nectar Thick Nectar Thick Liquid: Not tested   Honey Thick Honey Thick Liquid: Not tested   Puree Puree: Impaired Presentation: Spoon Oral Phase Impairments: Reduced labial seal Oral Phase Functional Implications: Oral residue;Right lateral sulci pocketing   Solid     Solid: Not tested      Damien Blumenthal, M.A., CCC-SLP Speech Language Pathology, Acute Rehabilitation Services  Secure Chat preferred [phone]  12/28/2024,10:39 AM

## 2024-12-28 NOTE — Evaluation (Signed)
 Speech Language Pathology Evaluation Patient Details Name: Roy [name] MRN: [id number] DOB: 1966-06-18 Today's Date: 12/28/2024 Time: 1000-1010 SLP Time Calculation (min) (ACUTE ONLY): 10 min  Problem List:  Patient Active Problem List   Diagnosis Date Noted   Stroke (HCC) 12/27/2024   Stroke (cerebrum) (HCC) 12/27/2024   Polysubstance dependence (HCC) 06/03/2024   Generalized abdominal pain 05/04/2024   Gastroenteritis 05/04/2024   Impaired ambulation 05/03/2024   AKI (acute kidney injury) 05/03/2024   Suicidal thoughts 06/16/2023   Cocaine abuse with cocaine-induced mood disorder (HCC) 11/13/2020   Allergic rhinitis 06/21/2020   GERD (gastroesophageal reflux disease) 06/21/2020   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   Arthritis pain, hip 05/18/2020   Status post total hip replacement, 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   Angioedema 07/24/2019   Chest pain 05/27/2018   ARF (acute renal failure) 04/08/2018   Malingering 03/07/2017   Cocaine dependence (HCC) 02/19/2017   Substance induced mood disorder (HCC) 02/19/2017   Alcohol use disorder, severe, dependence (HCC) 12/27/2016   HIV disease (HCC) 10/26/2016   HTN (hypertension) 10/26/2016   Dyslipidemia 10/26/2016   BPH (benign prostatic hyperplasia) 10/26/2016   Constipation 10/26/2016   Acquired hallux rigidus of right foot 07/16/2016   Arthritis 10/25/2014   Genital warts 08/25/2013   Past Medical History:  Past Medical History:  Diagnosis Date   AIDS (acquired immune deficiency syndrome) (HCC)    Angioedema    ARF (acute renal failure)    Arthritis    Asthma    Bipolar disorder (HCC)    BPH (benign prostatic hyperplasia)    Bradycardia    Bronchitis    Cocaine abuse (HCC)    Coronary artery disease    Depression    Dyslipidemia    Dysrhythmia    1st degree heart block/ brady   Genital warts    GERD (gastroesophageal reflux disease)    Hepatitis C    treated   Hernia of abdominal wall    HIV (human immunodeficiency virus  infection) (HCC)    HTN (hypertension)    Myocardial infarction (HCC) 2020   pt states stress test showed mild heart attack   Pre-diabetes    Septic hip (HCC)    SVT (supraventricular tachycardia)    Past Surgical History:  Past Surgical History:  Procedure Laterality Date   APPLICATION OF WOUND VAC Right 09/01/2019   Procedure: APPLICATION OF WOUND VAC;  Surgeon: Kathlynn Sharper, MD;  Location: [facility] ORS;  Service: Orthopedics;  Laterality: Right;  Serial # [id number]   APPLICATION OF WOUND VAC Right 12/27/2024   Procedure: RIGHT GROIN PREVENA APPLICATION, WOUND VAC;  Surgeon: Lanis Fonda BRAVO, MD;  Location: [facility] OR;  Service: Vascular;  Laterality: Right;   ENDARTERECTOMY FEMORAL Right 12/27/2024   Procedure: RIGHT FEMORAL ENDARTERECTOMY,L;  Surgeon: Lanis Fonda BRAVO, MD;  Location: [facility] OR;  Service: Vascular;  Laterality: Right;   HERNIA REPAIR Left    inguinal   PATCH ANGIOPLASTY Right 12/27/2024   Procedure: PATCH ANGIOPLASTY, USING PATCH GREATER SAPHENOUS VEIN;  Surgeon: Lanis Fonda BRAVO, MD;  Location: [facility] OR;  Service: Vascular;  Laterality: Right;   RADIOLOGY WITH ANESTHESIA N/A 12/27/2024   Procedure: RADIOLOGY WITH ANESTHESIA;  Surgeon: Radiologist, Medication, MD;  Location: [facility] OR;  Service: Radiology;  Laterality: N/A;   THROMBECTOMY FEMORAL ARTERY Right 12/27/2024   Procedure: RIGHT FEMORAL ARTERY THROMBECTOMY;  Surgeon: Lanis Fonda BRAVO, MD;  Location: [facility] OR;  Service: Vascular;  Laterality: Right;   TOE SURGERY Right    TOTAL HIP ARTHROPLASTY Right 09/01/2019  Procedure: TOTAL HIP ARTHROPLASTY ANTERIOR APPROACH;  Surgeon: Kathlynn Sharper, MD;  Location: [facility] ORS;  Service: Orthopedics;  Laterality: Right;   VEIN HARVEST Right 12/27/2024   Procedure: RIGHT GREATER SAPHENOUS VEIN HARVEST, SURGICAL PROCUREMENT, VEIN;  Surgeon: Lanis Fonda BRAVO, MD;  Location: [facility] OR;  Service: Vascular;  Laterality: Right;   HPI:  59 yo male presenting to ED 1/11 from [facility] rehab center with R sided  weakness and AMS s/p fall. CTA shows proximal L MCA and ICA occlusion s/p TNK and thrombectomy. Complicated by R CFA occlusion from closure device requiring exploration and endarterectomy with angioplasty. MRI shows acute L MCA territory infarct, confluent at the L basal ganglia, insula, and operculum as well as ongoing abnormal L ICA flow below the skull base. PMH includes HIV, history of hepatitis C, bipolar disorder, cocaine and EtOH abuse   Assessment / Plan / Recommendation Clinical Impression  Pt exhibits expressive and receptive language deficits. He vocalized intermittently throughout the session but did not verbalize. He follows simple, one step commands when given context and can complete functional tasks given set up assistance. Yes/no questions are unreliable, regardless of content. Noted use of gestures for social language. Intensive SLP f/u is recommended given pt's significant change from his cognitive-linguistic baseline.     SLP Assessment  SLP Recommendation/Assessment: Patient needs continued Speech Language Pathology Services SLP Visit Diagnosis: Aphasia (R47.01);Cognitive communication deficit (R41.841)     Assistance Recommended at Discharge  Frequent or constant Supervision/Assistance  Functional Status Assessment Patient has had a recent decline in their functional status and demonstrates the ability to make significant improvements in function in a reasonable and predictable amount of time.  Frequency and Duration min 2x/week  2 weeks      SLP Evaluation Cognition  Overall Cognitive Status: Difficult to assess Arousal/Alertness: Awake/alert Orientation Level: Disoriented X4 (pt mute)       Comprehension  Auditory Comprehension Overall Auditory Comprehension: Impaired Yes/No Questions: Impaired Basic Biographical Questions: 0-25% accurate Basic Immediate Environment Questions: 0-24% accurate Commands: Impaired One Step Basic Commands: 25-49% accurate     Expression Expression Primary Mode of Expression: Verbal Verbal Expression Overall Verbal Expression: Impaired Initiation: Impaired Repetition: Impaired Level of Impairment: Word level Naming: Impairment Confrontation: Impaired   Oral / Motor  Oral Motor/Sensory Function Overall Oral Motor/Sensory Function: Moderate impairment Facial ROM: Reduced right;Suspected CN VII (facial) dysfunction Facial Symmetry: Abnormal symmetry right;Suspected CN VII (facial) dysfunction Facial Strength: Reduced right;Suspected CN VII (facial) dysfunction Facial Sensation: Reduced right;Suspected CN V (Trigeminal) dysfunction Lingual ROM: Reduced right;Suspected CN XII (hypoglossal) dysfunction Lingual Symmetry: Abnormal symmetry right;Suspected CN XII (hypoglossal) dysfunction Lingual Strength: Reduced;Suspected CN XII (hypoglossal) dysfunction Motor Speech Overall Motor Speech: Other (comment) (UTA)            Damien Blumenthal, M.A., CCC-SLP Speech Language Pathology, Acute Rehabilitation Services  Secure Chat preferred [phone]  12/28/2024, 10:45 AM

## 2024-12-28 NOTE — Evaluation (Signed)
 Occupational Therapy Evaluation Patient Details Name: Roy [name] MRN: [id number] DOB: 1966/07/30 Today's Date: 12/28/2024   History of Present Illness   59 yo M adm 12/27/24 from [facility] rehab center after fall with Rt weakness, AMS. Pt with Lt ICA/MCA CVA s/p Lt ICA/MCA thrombectomy complicated by Rt CFA occlusion from closure device requiring exploration and endarterectomy with angioplasty. PMHx: cocaine & ETOH abuse, HIV, bipolar disorder, CAD     Clinical Impressions Rahmel was evaluated s/p the above admission list. He is indep at baseline. Upon evaluation the pt was limited by impaired communication and cognition, R weakness, poor balance and decreased activity tolerance. Overall he needed modA for bed mobility and to stand and max A +2 to pivot from the EOB to the chair. Due to the deficits listed below the pt also needs max A +2 for LB ADLs and mod A for UB ADLs. Pt will benefit from continued acute OT services and intensive inpatient follow up therapy, >3 hours/day after discharge.       If plan is discharge home, recommend the following:   A lot of help with walking and/or transfers;A lot of help with bathing/dressing/bathroom;Assistance with cooking/housework;Direct supervision/assist for medications management;Direct supervision/assist for financial management;Assist for transportation;Help with stairs or ramp for entrance     Functional Status Assessment   Patient has had a recent decline in their functional status and demonstrates the ability to make significant improvements in function in a reasonable and predictable amount of time.     Equipment Recommendations   Other (comment) (pending progress)     Recommendations for Other Services   Rehab consult     Precautions/Restrictions   Precautions Precautions: Fall;Other (comment) Recall of Precautions/Restrictions: Impaired Precaution/Restrictions Comments: Rt groin VAC Restrictions Weight Bearing  Restrictions Per Provider Order: No     Mobility Bed Mobility Overal bed mobility: Needs Assistance Bed Mobility: Supine to Sit     Supine to sit: Mod assist, HOB elevated     General bed mobility comments: pt able to initiate rise to sitting with mod assist to clear RLE and fully elevate trunk from surface, assist to fully scoot hips to EOB and achieve sitting balance    Transfers Overall transfer level: Needs assistance   Transfers: Sit to/from Stand, Bed to chair/wheelchair/BSC Sit to Stand: Mod assist Stand pivot transfers: Max assist, +2 physical assistance         General transfer comment: mod assist to rise from bed with Rt knee blocked, bil UE supported by therapists. Min assist to rise from chair x 2 trials with Rt knee blocked and use of LUE to push off handrest. Max +2 for pivot to chair toward Left, pt needing assist to weight shift and move RLE.      Balance Overall balance assessment: Needs assistance   Sitting balance-Leahy Scale: Poor Sitting balance - Comments: initial mod assist with progression to CGA EOB     Standing balance-Leahy Scale: Poor Standing balance comment: mod assist for standing, cues for posture and trunk/neck extension                           ADL either performed or assessed with clinical judgement   ADL Overall ADL's : Needs assistance/impaired Eating/Feeding: Total assistance;NPO   Grooming: Moderate assistance   Upper Body Bathing: Moderate assistance   Lower Body Bathing: Maximal assistance;+2 for safety/equipment;+2 for physical assistance   Upper Body Dressing : Moderate assistance   Lower Body  Dressing: Maximal assistance;+2 for physical assistance;+2 for safety/equipment   Toilet Transfer: Maximal assistance;+2 for physical assistance;+2 for safety/equipment   Toileting- Clothing Manipulation and Hygiene: Total assistance       Functional mobility during ADLs: Maximal assistance;+2 for physical  assistance;+2 for safety/equipment General ADL Comments: limited by R weakness, communication, cognition, balance. pt stands well but has a hard time shifting weight to pivot or step     Vision   Vision Assessment?: Vision impaired- to be further tested in functional context Additional Comments: forward head, tracks around to room with cues. needs further assessment     Perception Perception: Not tested       Praxis Praxis: Not tested       Pertinent Vitals/Pain Pain Assessment Pain Assessment: Faces Faces Pain Scale: No hurt Pain Intervention(s): Monitored during session     Extremity/Trunk Assessment Upper Extremity Assessment Upper Extremity Assessment: LUE deficits/detail;RUE deficits/detail RUE Deficits / Details: flaccid, responded to noxious stim LUE Deficits / Details: WFL   Lower Extremity Assessment Lower Extremity Assessment: Defer to PT evaluation RLE Deficits / Details: grossly 2-/5 able to initiate knee flexion and extension EOB, 1/5 hip flexion, partial buckling in standing   Cervical / Trunk Assessment Cervical / Trunk Assessment: Other exceptions Cervical / Trunk Exceptions: forward head   Communication Communication Communication: Impaired Factors Affecting Communication: Difficulty expressing self   Cognition Arousal: Alert Behavior During Therapy: Flat affect Cognition: Difficult to assess Difficult to assess due to: Impaired communication           OT - Cognition Comments: followed most simple commands                 Following commands: Impaired Following commands impaired: Follows one step commands inconsistently, Follows one step commands with increased time     Cueing  General Comments   Cueing Techniques: Verbal cues;Gestural cues;Tactile cues  VSS on RA   Exercises     Shoulder Instructions      Home Living Family/patient expects to be discharged to:: Private residence Living Arrangements: Alone Available Help at  Discharge: Family Type of Home: House Home Access: Stairs to enter Secretary/administrator of Steps: 1 Entrance Stairs-Rails: Can reach both Home Layout: Two level     Bathroom Shower/Tub: Chief Strategy Officer: Standard     Home Equipment: Agricultural Consultant (2 wheels)   Additional Comments: pt was living alone in an apartment without steps PTA. Sister April able to assist at DC as she is currently unemployed but that could change and home information is for her house. Pt was not driving but would walk or bike places      Prior Functioning/Environment Prior Level of Function : Independent/Modified Independent             Mobility Comments: sometimes will use RW due to R hip arthritic pain      OT Problem List: Decreased range of motion;Decreased strength;Decreased activity tolerance;Decreased safety awareness;Decreased knowledge of use of DME or AE;Decreased knowledge of precautions   OT Treatment/Interventions: Self-care/ADL training;Therapeutic exercise;DME and/or AE instruction;Therapeutic activities;Patient/family education;Balance training      OT Goals(Current goals can be found in the care plan section)   Acute Rehab OT Goals Patient Stated Goal: unable to state OT Goal Formulation: With patient Time For Goal Achievement: 01/11/25 Potential to Achieve Goals: Good ADL Goals Pt Will Perform Grooming: with set-up Pt Will Perform Upper Body Dressing: with set-up Pt Will Perform Lower Body Dressing: with min assist Pt  Will Transfer to Toilet: with min assist;stand pivot transfer;bedside commode Additional ADL Goal #1: Pt will use RUE as a functional assist during ADL with min A   OT Frequency:  Min 2X/week    Co-evaluation PT/OT/SLP Co-Evaluation/Treatment: Yes Reason for Co-Treatment: Complexity of the patient's impairments (multi-system involvement) PT goals addressed during session: Mobility/safety with mobility;Balance        AM-PAC OT 6  Clicks Daily Activity     Outcome Measure Help from another person eating meals?: Total Help from another person taking care of personal grooming?: A Lot Help from another person toileting, which includes using toliet, bedpan, or urinal?: A Lot Help from another person bathing (including washing, rinsing, drying)?: A Lot Help from another person to put on and taking off regular upper body clothing?: A Lot Help from another person to put on and taking off regular lower body clothing?: A Lot 6 Click Score: 11   End of Session Equipment Utilized During Treatment: Gait belt Nurse Communication: Mobility status  Activity Tolerance: Patient tolerated treatment well Patient left: in chair;with call bell/phone within reach;with chair alarm set  OT Visit Diagnosis: Unsteadiness on feet (R26.81);Other abnormalities of gait and mobility (R26.89);Muscle weakness (generalized) (M62.81);Hemiplegia and hemiparesis;Cognitive communication deficit (R41.841) Symptoms and signs involving cognitive functions: Cerebral infarction Hemiplegia - Right/Left: Right Hemiplegia - dominant/non-dominant: Dominant Hemiplegia - caused by: Cerebral infarction                Time: 8976-8953 OT Time Calculation (min): 23 min Charges:  OT General Charges $OT Visit: 1 Visit OT Evaluation $OT Eval Moderate Complexity: 1 Mod  Lucie Kendall, OTR/L Acute Rehabilitation Services Office [phone] Secure Chat Communication Preferred   [name] 12/28/2024, 12:01 PM

## 2024-12-28 NOTE — Progress Notes (Addendum)
 " Progress Note    12/28/2024 8:44 AM 1 Day Post-Op  Subjective: Sleeping   Vitals:   12/28/24 0700 12/28/24 0800  BP: 138/86 (!) 147/96  Pulse: (!) 51 64  Resp: 18 19  Temp:  99.3 F (37.4 C)  SpO2: 94% 96%    Physical Exam: General: Resting, NAD Cardiac: Regular Lungs: Nonlabored Incisions: Right groin with Prevena VAC with good seal.  Right groin with JP drain with serosanguineous output Extremities: Palpable PT pulses bilaterally.  Right calf is soft  CBC    Component Value Date/Time   WBC 9.0 12/28/2024 0510   RBC 3.17 (L) 12/28/2024 0510   HGB 10.8 (L) 12/28/2024 0510   HGB 14.4 01/14/2024 1043   HCT 31.5 (L) 12/28/2024 0510   HCT 42.6 01/14/2024 1043   PLT 169 12/28/2024 0510   PLT 220 01/14/2024 1043   MCV 99.4 12/28/2024 0510   MCV 101 (H) 01/14/2024 1043   MCV 99 01/06/2015 1505   MCH 34.1 (H) 12/28/2024 0510   MCHC 34.3 12/28/2024 0510   RDW 12.3 12/28/2024 0510   RDW 11.9 01/14/2024 1043   RDW 13.0 01/06/2015 1505   LYMPHSABS 0.9 12/27/2024 0705   LYMPHSABS 1.6 01/14/2024 1043   LYMPHSABS 2.6 01/06/2015 1505   MONOABS 0.4 12/27/2024 0705   MONOABS 0.3 01/06/2015 1505   EOSABS 0.0 12/27/2024 0705   EOSABS 0.1 01/14/2024 1043   EOSABS 0.1 01/06/2015 1505   BASOSABS 0.0 12/27/2024 0705   BASOSABS 0.0 01/14/2024 1043   BASOSABS 0.0 01/06/2015 1505    BMET    Component Value Date/Time   NA 138 12/28/2024 0510   NA 141 01/06/2015 1505   K 3.9 12/28/2024 0510   K 3.9 01/06/2015 1505   CL 107 12/28/2024 0510   CL 107 01/06/2015 1505   CO2 21 (L) 12/28/2024 0510   CO2 29 01/06/2015 1505   GLUCOSE 102 (H) 12/28/2024 0510   GLUCOSE 111 (H) 01/06/2015 1505   BUN 15 12/28/2024 0510   BUN 16 01/06/2015 1505   CREATININE 1.10 12/28/2024 0510   CREATININE 1.28 01/06/2015 1505   CALCIUM  8.2 (L) 12/28/2024 0510   CALCIUM  9.0 01/06/2015 1505   GFRNONAA >60 12/28/2024 0510   GFRNONAA >60 01/06/2015 1505   GFRNONAA >60 05/10/2013 0535   GFRAA 54  (L) 07/12/2020 1511   GFRAA >60 01/06/2015 1505   GFRAA >60 05/10/2013 0535    INR    Component Value Date/Time   INR 1.0 12/27/2024 0705     Intake/Output Summary (Last 24 hours) at 12/28/2024 0844 Last data filed at 12/28/2024 0700 Gross per 24 hour  Intake 3946.94 ml  Output 2390 ml  Net 1556.94 ml      Assessment/Plan:  59 y.o. male is 1 day postop, s/p: Right iliofemoral embolectomy, right iliofemoral endarterectomy with saphenous vein angioplasty   - The patient is resting this morning without any overnight issues. He underwent successful right iliofemoral embolectomy and endarterectomy yesterday after femoral artery occlusion from access device -Right groin incision is intact with Prevena VAC with good seal -JP drain from the right groin with 35 cc, will keep in today and likely remove tomorrow -Right calf is soft without evidence of compartment syndrome -Right lower extremity remains well-perfused with a palpable PT pulse   Ahmed Holster, PA-C [facility] [phone] 12/28/2024 8:44 AM  VASCULAR STAFF ADDENDUM: I have independently interviewed and examined the patient. I agree with the above.  Palpable pulse in the foot.  Drain pulled.  Prevena for 1 week. Please ensure the device in plugged into the wall   Fonda FORBES Rim MD [facility] Phone Number: [phone] 12/28/2024 11:58 AM     "

## 2024-12-28 NOTE — TOC CAGE-AID Note (Signed)
 Transition of Care [facility]) - CAGE-AID Screening   Patient Details  Name: [name] MRN: [id number] Date of Birth: [birth year]  [facility]) CM/SW Contact:    Toniette Devera E Seren Chaloux, LCSW Phone Number: 12/28/2024, 10:12 AM   Clinical Narrative: Disoriented x 4. Of note, patient admitted from residential SA treatment center per chart review.   CAGE-AID Screening: Substance Abuse Screening unable to be completed due to: : Patient unable to participate

## 2024-12-28 NOTE — Evaluation (Addendum)
 Physical Therapy Evaluation Patient Details Name: Roy [name] MRN: [id number] DOB: [birth year] Today's Date: 12/28/2024  History of Present Illness  59 yo M adm 12/27/24 from ETOH rehab center after fall with Rt weakness, AMS. Pt with Lt ICA/MCA CVA s/p Lt ICA/MCA thrombectomy complicated by Rt CFA occlusion from closure device requiring exploration and endarterectomy with angioplasty. PMHx: cocaine & ETOH abuse, HIV, bipolar disorder, CAD  Clinical Impression  Roy [name] demonstrates flat affect, aphasia and Rt hemiparesis. Pt able to initiate mobility with delay at times for moving to EOB, standing and pivot to chair. Pt with flexed trunk in standing, decreased stance and transfers due to weakness and impaired balance but able to perform repeated sit to stand transfers from chair with increased performance and should do well with stedy for nursing staff. PTA pt was living alone in an apartment with sister Roy [name] current able to assist at D/C. Pt will benefit from acute therapy to maximize mobility, safety, balance and function to decrease burden of care. Patient will benefit from intensive inpatient follow-up therapy, >3 hours/day       If plan is discharge home, recommend the following: Two people to help with walking and/or transfers;A lot of help with bathing/dressing/bathroom;Assistance with feeding;Assistance with cooking/housework;Direct supervision/assist for financial management;Assist for transportation;Direct supervision/assist for medications management;Supervision due to cognitive status   Can travel by private vehicle        Equipment Recommendations BSC/3in1;Wheelchair (measurements PT);Wheelchair cushion (measurements PT);Other (comment) (hemi-walker)  Recommendations for Other Services  Rehab consult    Functional Status Assessment Patient has had a recent decline in their functional status and demonstrates the ability to make significant improvements in function in a  reasonable and predictable amount of time.     Precautions / Restrictions Precautions Precautions: Fall;Other (comment) Recall of Precautions/Restrictions: Impaired Precaution/Restrictions Comments: Rt groin VAC      Mobility  Bed Mobility Overal bed mobility: Needs Assistance Bed Mobility: Supine to Sit     Supine to sit: Mod assist, HOB elevated     General bed mobility comments: pt able to initiate rise to sitting with mod assist to clear RLE and fully elevate trunk from surface, assist to fully scoot hips to EOB and achieve sitting balance    Transfers Overall transfer level: Needs assistance   Transfers: Sit to/from Stand, Bed to chair/wheelchair/BSC Sit to Stand: Mod assist Stand pivot transfers: Max assist, +2 physical assistance         General transfer comment: mod assist to rise from bed with Rt knee blocked, bil UE supported by therapists. Min assist to rise from chair x 2 trials with Rt knee blocked and use of LUE to push off handrest. Max +2 for pivot to chair toward Left, pt needing assist to weight shift and move RLE.    Ambulation/Gait                  Stairs            Wheelchair Mobility     Tilt Bed    Modified Rankin (Stroke Patients Only) Modified Rankin (Stroke Patients Only) Pre-Morbid Rankin Score: No symptoms Modified Rankin: Severe disability     Balance Overall balance assessment: Needs assistance   Sitting balance-Leahy Scale: Poor Sitting balance - Comments: initial mod assist with progression to CGA EOB     Standing balance-Leahy Scale: Poor Standing balance comment: mod assist for standing, cues for posture and trunk/neck extension  Pertinent Vitals/Pain Pain Assessment Pain Assessment: CPOT Faces Pain Scale: No hurt Facial Expression: Relaxed, neutral Body Movements: Absence of movements Muscle Tension: Relaxed Compliance with ventilator (intubated pts.):  N/A Vocalization (extubated pts.): Talking in normal tone or no sound CPOT Total: 0 Pain Intervention(s): Monitored during session, Repositioned    Home Living Family/patient expects to be discharged to:: Private residence Living Arrangements: Alone   Type of Home: House Home Access: Stairs to enter   Secretary/administrator of Steps: 1   Home Layout: Two level   Additional Comments: pt was living alone in an apartment without steps PTA. Sister Roy [name] able to assist at DC as she is currently unemployed but that could change and home information is for her house. Pt was not driving but would walk or bike places    Prior Function Prior Level of Function : Independent/Modified Independent                     Extremity/Trunk Assessment   Upper Extremity Assessment Upper Extremity Assessment: Defer to OT evaluation    Lower Extremity Assessment Lower Extremity Assessment: RLE deficits/detail RLE Deficits / Details: grossly 2-/5 able to initiate knee flexion and extension EOB, 1/5 hip flexion, partial buckling in standing    Cervical / Trunk Assessment Cervical / Trunk Assessment: Other exceptions Cervical / Trunk Exceptions: forward head  Communication   Communication Communication: Impaired Factors Affecting Communication: Difficulty expressing self    Cognition Arousal: Alert Behavior During Therapy: Flat affect   PT - Cognitive impairments: Difficult to assess Difficult to assess due to: Impaired communication                       Following commands: Impaired Following commands impaired: Follows one step commands inconsistently, Follows one step commands with increased time     Cueing Cueing Techniques: Verbal cues, Gestural cues, Tactile cues     General Comments      Exercises     Assessment/Plan    PT Assessment Patient needs continued PT services  PT Problem List Decreased strength;Decreased coordination;Cardiopulmonary status limiting  activity;Decreased range of motion;Decreased cognition;Decreased activity tolerance;Decreased balance;Decreased mobility;Decreased safety awareness       PT Treatment Interventions Gait training;Balance training;DME instruction;Therapeutic exercise;Functional mobility training;Cognitive remediation;Therapeutic activities;Patient/family education;Neuromuscular re-education    PT Goals (Current goals can be found in the Care Plan section)  Acute Rehab PT Goals Patient Stated Goal: walk PT Goal Formulation: With family Time For Goal Achievement: 01/11/25 Potential to Achieve Goals: Good    Frequency Min 3X/week     Co-evaluation PT/OT/SLP Co-Evaluation/Treatment: Yes Reason for Co-Treatment: Complexity of the patient's impairments (multi-system involvement) PT goals addressed during session: Mobility/safety with mobility;Balance         AM-PAC PT 6 Clicks Mobility  Outcome Measure Help needed turning from your back to your side while in a flat bed without using bedrails?: A Lot Help needed moving from lying on your back to sitting on the side of a flat bed without using bedrails?: A Lot Help needed moving to and from a bed to a chair (including a wheelchair)?: Total Help needed standing up from a chair using your arms (e.g., wheelchair or bedside chair)?: A Lot Help needed to walk in hospital room?: Total Help needed climbing 3-5 steps with a railing? : Total 6 Click Score: 9    End of Session Equipment Utilized During Treatment: Gait belt Activity Tolerance: Patient tolerated treatment well Patient left: in chair;with call  bell/phone within reach;with nursing/sitter in room;with chair alarm set Nurse Communication: Mobility status PT Visit Diagnosis: Other abnormalities of gait and mobility (R26.89);Difficulty in walking, not elsewhere classified (R26.2);Hemiplegia and hemiparesis;Other symptoms and signs involving the nervous system (R29.898) Hemiplegia - Right/Left:  Right Hemiplegia - dominant/non-dominant: Dominant Hemiplegia - caused by: Cerebral infarction    Time: 1021-1047 PT Time Calculation (min) (ACUTE ONLY): 26 min   Charges:   PT Evaluation $PT Eval Moderate Complexity: 1 Mod   PT General Charges $$ ACUTE PT VISIT: 1 Visit         [name], PT Acute Rehabilitation Services Office: [phone]   Lenoard NOVAK Lundy Cozart 12/28/2024, 11:06 AM

## 2024-12-29 DIAGNOSIS — R233 Spontaneous ecchymoses: Secondary | ICD-10-CM | POA: Diagnosis not present

## 2024-12-29 DIAGNOSIS — R29716 NIHSS score 16: Secondary | ICD-10-CM | POA: Diagnosis not present

## 2024-12-29 DIAGNOSIS — I63512 Cerebral infarction due to unspecified occlusion or stenosis of left middle cerebral artery: Secondary | ICD-10-CM | POA: Diagnosis not present

## 2024-12-29 DIAGNOSIS — G936 Cerebral edema: Secondary | ICD-10-CM | POA: Diagnosis not present

## 2024-12-29 DIAGNOSIS — I69391 Dysphagia following cerebral infarction: Secondary | ICD-10-CM | POA: Diagnosis not present

## 2024-12-29 DIAGNOSIS — B20 Human immunodeficiency virus [HIV] disease: Secondary | ICD-10-CM | POA: Diagnosis not present

## 2024-12-29 DIAGNOSIS — I10 Essential (primary) hypertension: Secondary | ICD-10-CM | POA: Diagnosis not present

## 2024-12-29 DIAGNOSIS — E785 Hyperlipidemia, unspecified: Secondary | ICD-10-CM | POA: Diagnosis not present

## 2024-12-29 DIAGNOSIS — F17210 Nicotine dependence, cigarettes, uncomplicated: Secondary | ICD-10-CM | POA: Diagnosis not present

## 2024-12-29 DIAGNOSIS — I779 Disorder of arteries and arterioles, unspecified: Secondary | ICD-10-CM | POA: Diagnosis not present

## 2024-12-29 DIAGNOSIS — F1410 Cocaine abuse, uncomplicated: Secondary | ICD-10-CM | POA: Diagnosis not present

## 2024-12-29 MED ORDER — MAGNESIUM SULFATE 2 GM/50ML IV SOLN
2.0000 g | Freq: Once | INTRAVENOUS | Status: AC
  Administered 2024-12-29: 2 g via INTRAVENOUS
  Filled 2024-12-29: qty 50

## 2024-12-29 MED ORDER — K PHOS MONO-SOD PHOS DI & MONO 155-852-130 MG PO TABS
500.0000 mg | ORAL_TABLET | ORAL | Status: AC
  Administered 2024-12-29 (×2): 500 mg via ORAL
  Filled 2024-12-29 (×2): qty 2

## 2024-12-29 NOTE — Progress Notes (Signed)
 Inpatient Rehab Admissions Coordinator:    I discussed CIR with Pt.'s sister. She wants some rehab for patient but is not sure family can assist Pt. At d/c. She states she isn't working and he could stay with her but that would be temporary as she is actively looking for work. She states she will discuss with other family and consider whether they want rehab at [facility] with home d/c after or if they want SNF with potential LTC placement for pt.   Leita Kleine, MS, CCC-SLP Rehab Admissions Coordinator  [phone] (celll) [phone] (office)

## 2024-12-29 NOTE — Progress Notes (Signed)
 Speech Language Pathology Treatment: Dysphagia;Cognitive-Linguistic  Patient Details Name: Roy [name] MRN: [id number] DOB: 06-22-1966 Today's Date: 12/29/2024 Time: 1430-1450 SLP Time Calculation (min) (ACUTE ONLY): 20 min  Assessment / Plan / Recommendation Clinical Impression  Cognitive-Linguistic: Pt continues to present with expressive and receptive language deficits. One step command following was much more consistent today. He does not answer yes/no questions. Verbal output was not observed, including during automatic language tasks, though he spontaneously used gestures occasionally. Visual discrimination in a field of two was ~50% accurate. Intensive SLP f/u is recommended.   Dysphagia: Notified by RN and PT that pt had R side pocketing when eating lunch unsupervised but this did not occur earlier in the day with AM meal. Expect pt to need cueing for meals with potential for fatigue to impact function. With Min cueing, he cleared his oral cavity completely when given pureed and chopped solids. He is generally unaware of minimal anterior loss. Consecutive sips of water were observed without signs clinically concerning for aspiration. For now, continue current diet but expect good prognosis to advance in the short-term if receiving consistent supervision at meal times. Sign posted at [facility] reinforcing recommendations.     HPI HPI: 59 yo male presenting to ED 1/11 from EtOH rehab center with R sided weakness and AMS s/p fall. CTA shows proximal L MCA and ICA occlusion s/p TNK and thrombectomy. Complicated by R CFA occlusion from closure device requiring exploration and endarterectomy with angioplasty. MRI shows acute L MCA territory infarct, confluent at the L basal ganglia, insula, and operculum as well as ongoing abnormal L ICA flow below the skull base. PMH includes HIV, history of hepatitis C, bipolar disorder, cocaine and EtOH abuse      SLP Plan  Continue with current plan of  care        Swallow Evaluation Recommendations   Recommendations: PO diet PO Diet Recommendation: Dysphagia 1 (Pureed);Thin liquids (Level 0) Liquid Administration via: Cup;Straw Medication Administration: Whole meds with puree Supervision: Full assist for feeding;Full supervision/cueing for swallowing strategies Postural changes: Position pt fully upright for meals Oral care recommendations: Oral care BID (2x/day)     Recommendations                     Oral care BID   Frequent or constant Supervision/Assistance Dysphagia, oropharyngeal phase (R13.12);Aphasia (R47.01)     Continue with current plan of care     Damien Blumenthal, M.A., CCC-SLP Speech Language Pathology, Acute Rehabilitation Services  Secure Chat preferred [phone]   12/29/2024, 3:14 PM

## 2024-12-29 NOTE — Progress Notes (Signed)
 STROKE TEAM PROGRESS NOTE    SIGNIFICANT HOSPITAL EVENTS 1/11: Patient admitted with aphasia and right-sided weakness, given TNK and mechanical thrombectomy performed.  INTERIM HISTORY/SUBJECTIVE Patient continues to be globally aphasic with right hemiplegia but is able to try to speak now and make some guttural sounds.  He follows midline and one-step commands on the left.  Speech therapy has cleared him for dysphagia 1 diet. Patient remains hemodynamically stable and afebrile overnight.   No family members at the bedside today.  Vital signs stable. OBJECTIVE  CBC    Component Value Date/Time   WBC 9.0 12/28/2024 0510   RBC 3.17 (L) 12/28/2024 0510   HGB 10.8 (L) 12/28/2024 0510   HGB 14.4 01/14/2024 1043   HCT 31.5 (L) 12/28/2024 0510   HCT 42.6 01/14/2024 1043   PLT 169 12/28/2024 0510   PLT 220 01/14/2024 1043   MCV 99.4 12/28/2024 0510   MCV 101 (H) 01/14/2024 1043   MCV 99 01/06/2015 1505   MCH 34.1 (H) 12/28/2024 0510   MCHC 34.3 12/28/2024 0510   RDW 12.3 12/28/2024 0510   RDW 11.9 01/14/2024 1043   RDW 13.0 01/06/2015 1505   LYMPHSABS 0.9 12/27/2024 0705   LYMPHSABS 1.6 01/14/2024 1043   LYMPHSABS 2.6 01/06/2015 1505   MONOABS 0.4 12/27/2024 0705   MONOABS 0.3 01/06/2015 1505   EOSABS 0.0 12/27/2024 0705   EOSABS 0.1 01/14/2024 1043   EOSABS 0.1 01/06/2015 1505   BASOSABS 0.0 12/27/2024 0705   BASOSABS 0.0 01/14/2024 1043   BASOSABS 0.0 01/06/2015 1505    BMET    Component Value Date/Time   NA 138 12/28/2024 0510   NA 141 01/06/2015 1505   K 3.9 12/28/2024 0510   K 3.9 01/06/2015 1505   CL 107 12/28/2024 0510   CL 107 01/06/2015 1505   CO2 21 (L) 12/28/2024 0510   CO2 29 01/06/2015 1505   GLUCOSE 102 (H) 12/28/2024 0510   GLUCOSE 111 (H) 01/06/2015 1505   BUN 15 12/28/2024 0510   BUN 16 01/06/2015 1505   CREATININE 1.10 12/28/2024 0510   CREATININE 1.28 01/06/2015 1505   CALCIUM  8.2 (L) 12/28/2024 0510   CALCIUM  9.0 01/06/2015 1505   GFRNONAA >60  12/28/2024 0510   GFRNONAA >60 01/06/2015 1505   GFRNONAA >60 05/10/2013 0535    IMAGING past 24 hours DG Swallowing Func-Speech Pathology Result Date: 12/28/2024 Table formatting from the original result was not included. Modified Barium Swallow Study Patient Details Name: Roy [name] MRN: [id number] Date of Birth: 1966-10-18 Today's Date: 12/28/2024 HPI/PMH: HPI: 59 yo male presenting to ED 1/11 from [facility] rehab center with R sided weakness and AMS s/p fall. CTA shows proximal L MCA and ICA occlusion s/p TNK and thrombectomy. Complicated by R CFA occlusion from closure device requiring exploration and endarterectomy with angioplasty. MRI shows acute L MCA territory infarct, confluent at the L basal ganglia, insula, and operculum as well as ongoing abnormal L ICA flow below the skull base. PMH includes HIV, history of hepatitis C, bipolar disorder, cocaine and EtOH abuse Clinical Impression: Pt exhibits primary oral dysphagia with mild pharyngeal deficits. He is edentulous and it is unclear if he wears dentures at baseline. This, in addition to R CN V, VII, and XII deficits are suspected to contribute to prolonged mastication and moderate anterior loss. There is a collection of oral and pharyngeal residue across all consistencies that consistently clears with subswallows. Anterior hyoid movement and epiglottic inversion are partial but complete laryngeal elevation closes  the laryngeal vestibule completely at the height of the swallow. Trace penetration occurs with both thin and nectar thick liquids but stays above the vocal folds and is eventually expelled as the study progresses (PAS 2). Even when having difficulty with orally managing the 13 mm barium tablet with thin liquids, the bolus did not progress lower in the laryngeal vestibule. To clear the tablet from his oral cavity, subsequent bites of purees were given but it then became transiently lodged in the pyriform sinuses. Once the tablet cleared  from the oropharynx, no esophageal retention was noted. Recommend Dys 1 diet with thin liquids. Meds can be given whole with puree (crush if larger). SLP will f/u. Factors that may increase risk of adverse event in presence of aspiration Noe & Lianne 2021): Factors that may increase risk of adverse event in presence of aspiration Noe & Lianne 2021): Reduced cognitive function Recommendations/Plan: Swallowing Evaluation Recommendations Swallowing Evaluation Recommendations Recommendations: PO diet PO Diet Recommendation: Dysphagia 1 (Pureed); Thin liquids (Level 0) Liquid Administration via: Cup; Straw Medication Administration: Whole meds with puree Supervision: Full supervision/cueing for swallowing strategies; Staff to assist with self-feeding Swallowing strategies  : Check for pocketing or oral holding; Check for anterior loss Postural changes: Position pt fully upright for meals Oral care recommendations: Oral care BID (2x/day) Treatment Plan Treatment Plan Treatment recommendations: Therapy as outlined in treatment plan below Follow-up recommendations: Acute inpatient rehab (3 hours/day) Functional status assessment: Patient has had a recent decline in their functional status and demonstrates the ability to make significant improvements in function in a reasonable and predictable amount of time. Treatment frequency: Min 2x/week Treatment duration: 2 weeks Interventions: Oropharyngeal exercises; Compensatory techniques; Trials of upgraded texture/liquids Recommendations Recommendations for follow up therapy are one component of a multi-disciplinary discharge planning process, led by the attending physician.  Recommendations may be updated based on patient status, additional functional criteria and insurance authorization. Assessment: Orofacial Exam: Orofacial Exam Oral Cavity: Oral Hygiene: WFL Oral Cavity - Dentition: Edentulous Orofacial Anatomy: WFL Oral Motor/Sensory Function: Suspected cranial  nerve impairment CN V - Trigeminal: Right sensory impairment CN VII - Facial: Right motor impairment CN IX - Glossopharyngeal, CN X - Vagus: WFL CN XII - Hypoglossal: Right motor impairment Anatomy: Anatomy: Suspected cervical osteophytes Boluses Administered: Boluses Administered Boluses Administered: Thin liquids (Level 0); Mildly thick liquids (Level 2, nectar thick); Moderately thick liquids (Level 3, honey thick); Puree; Solid  Oral Impairment Domain: Oral Impairment Domain Lip Closure: Escape beyond mid-chin Tongue control during bolus hold: Not tested Bolus preparation/mastication: Slow prolonged chewing/mashing with complete recollection Bolus transport/lingual motion: Delayed initiation of tongue motion (oral holding) Oral residue: Residue collection on oral structures Location of oral residue : Floor of mouth; Tongue; Lateral sulci Initiation of pharyngeal swallow : Pyriform sinuses  Pharyngeal Impairment Domain: Pharyngeal Impairment Domain Soft palate elevation: No bolus between soft palate (SP)/pharyngeal wall (PW) Laryngeal elevation: Complete superior movement of thyroid  cartilage with complete approximation of arytenoids to epiglottic petiole Anterior hyoid excursion: Partial anterior movement Epiglottic movement: No inversion Laryngeal vestibule closure: Complete, no air/contrast in laryngeal vestibule Pharyngeal stripping wave : Present - complete Pharyngeal contraction (A/P view only): N/A Pharyngoesophageal segment opening: Complete distension and complete duration, no obstruction of flow Tongue base retraction: No contrast between tongue base and posterior pharyngeal wall (PPW) Pharyngeal residue: Collection of residue within or on pharyngeal structures Location of pharyngeal residue: Valleculae; Pyriform sinuses  Esophageal Impairment Domain: Esophageal Impairment Domain Esophageal clearance upright position: Complete clearance, esophageal coating Pill:  Pill Consistency administered: Thin  liquids (Level 0); Puree Thin liquids (Level 0): Impaired (see clinical impressions) Puree: WFL Penetration/Aspiration Scale Score: Penetration/Aspiration Scale Score 1.  Material does not enter airway: Moderately thick liquids (Level 3, honey thick); Puree; Solid; Pill 2.  Material enters airway, remains ABOVE vocal cords then ejected out: Thin liquids (Level 0); Mildly thick liquids (Level 2, nectar thick) Compensatory Strategies: Compensatory Strategies Compensatory strategies: No   General Information: Caregiver present: Yes BANKER)  Diet Prior to this Study: NPO   Temperature : Normal   Respiratory Status: WFL   Supplemental O2: None (Room air)   History of Recent Intubation: No  Behavior/Cognition: Alert; Cooperative; Requires cueing Self-Feeding Abilities: Needs set-up for self-feeding Baseline vocal quality/speech: Normal Volitional Cough: Able to elicit Volitional Swallow: Able to elicit Exam Limitations: No limitations Goal Planning: Prognosis for improved oropharyngeal function: Good Barriers to Reach Goals: Language deficits No data recorded Patient/Family Stated Goal: difficulty stating Consulted and agree with results and recommendations: Patient; Nurse Pain: Pain Assessment Pain Assessment: Faces Faces Pain Scale: 0 Facial Expression: 0 Body Movements: 0 Muscle Tension: 0 Compliance with ventilator (intubated pts.): N/A Vocalization (extubated pts.): 0 CPOT Total: 0 Pain Intervention(s): Monitored during session End of Session: Start Time:SLP Start Time (ACUTE ONLY): 1523 Stop Time: SLP Stop Time (ACUTE ONLY): 1546 Time Calculation:SLP Time Calculation (min) (ACUTE ONLY): 23 min Charges: SLP Evaluations $ SLP Speech Visit: 1 Visit SLP Evaluations $BSS Swallow: 1 Procedure $MBS Swallow: 1 Procedure $ SLP EVAL LANGUAGE/SOUND PRODUCTION: 1 Procedure SLP visit diagnosis: SLP Visit Diagnosis: Dysphagia, oropharyngeal phase (R13.12) Past Medical History: Past Medical History: Diagnosis Date  AIDS (acquired  immune deficiency syndrome) (HCC)   Angioedema   ARF (acute renal failure)   Arthritis   Asthma   Bipolar disorder (HCC)   BPH (benign prostatic hyperplasia)   Bradycardia   Bronchitis   Cocaine abuse (HCC)   Coronary artery disease   Depression   Dyslipidemia   Dysrhythmia   1st degree heart block/ brady  Genital warts   GERD (gastroesophageal reflux disease)   Hepatitis C   treated  Hernia of abdominal wall   HIV (human immunodeficiency virus infection) (HCC)   HTN (hypertension)   Myocardial infarction (HCC) 2020  pt states stress test showed mild heart attack  Pre-diabetes   Septic hip (HCC)   SVT (supraventricular tachycardia)  Past Surgical History: Past Surgical History: Procedure Laterality Date  APPLICATION OF WOUND VAC Right 09/01/2019  Procedure: APPLICATION OF WOUND VAC;  Surgeon: Kathlynn Sharper, MD;  Location: [facility] ORS;  Service: Orthopedics;  Laterality: Right;  Serial # [id number]  APPLICATION OF WOUND VAC Right 12/27/2024  Procedure: RIGHT GROIN PREVENA APPLICATION, WOUND VAC;  Surgeon: Lanis Fonda BRAVO, MD;  Location: [facility] OR;  Service: Vascular;  Laterality: Right;  ENDARTERECTOMY FEMORAL Right 12/27/2024  Procedure: RIGHT FEMORAL ENDARTERECTOMY,L;  Surgeon: Lanis Fonda BRAVO, MD;  Location: [facility] OR;  Service: Vascular;  Laterality: Right;  HERNIA REPAIR Left   inguinal  PATCH ANGIOPLASTY Right 12/27/2024  Procedure: PATCH ANGIOPLASTY, USING PATCH GREATER SAPHENOUS VEIN;  Surgeon: Lanis Fonda BRAVO, MD;  Location: [facility] OR;  Service: Vascular;  Laterality: Right;  RADIOLOGY WITH ANESTHESIA N/A 12/27/2024  Procedure: RADIOLOGY WITH ANESTHESIA;  Surgeon: Radiologist, Medication, MD;  Location: [facility] OR;  Service: Radiology;  Laterality: N/A;  THROMBECTOMY FEMORAL ARTERY Right 12/27/2024  Procedure: RIGHT FEMORAL ARTERY THROMBECTOMY;  Surgeon: Lanis Fonda BRAVO, MD;  Location: [facility] OR;  Service: Vascular;  Laterality: Right;  TOE SURGERY Right  TOTAL HIP ARTHROPLASTY Right 09/01/2019  Procedure: TOTAL HIP ARTHROPLASTY  ANTERIOR APPROACH;  Surgeon: Kathlynn Sharper, MD;  Location: [facility] ORS;  Service: Orthopedics;  Laterality: Right;  VEIN HARVEST Right 12/27/2024  Procedure: RIGHT GREATER SAPHENOUS VEIN HARVEST, SURGICAL PROCUREMENT, VEIN;  Surgeon: Lanis Fonda BRAVO, MD;  Location: [facility] OR;  Service: Vascular;  Laterality: Right; Damien Blumenthal, M.A., CCC-SLP Speech Language Pathology, Acute Rehabilitation Services Secure Chat preferred [phone] 12/28/2024, 4:17 PM   Vitals:   12/29/24 1200 12/29/24 1300 12/29/24 1344 12/29/24 1400  BP: 127/72 125/71 127/72 124/78  Pulse: (!) 56 (!) 57  (!) 47  Resp: (!) 22 19  18   Temp: 99.1 F (37.3 C)     TempSrc: Oral     SpO2: 97% 95%  98%  Weight:         PHYSICAL EXAM General:  Alert, well-nourished, well-developed patient in no acute distress Psych:  Mood and affect appropriate for situation CV: Regular rate and rhythm on monitor Respiratory:  Regular, unlabored respirations on room air   NEURO:  Mental Status: Patient remains globally aphasic but responds to name and intermittently follows simple commands.  He will mimic Speech/Language: No verbal output  Cranial Nerves:  II: PERRL.  III, IV, VI: Left gaze deviation, able to go to midline VII: Face is symmetrical resting  VIII: hearing intact to voice. XII: tongue is midline without fasciculations. Motor: Able to move left upper and lower extremities with antigravity strength, no movement of right upper extremity, moves right lower extremity but will not lift it off the bed Tone: is normal and bulk is normal Sensation- Intact to light touch bilaterally.  Coordination: Unable to perform Gait- deferred  Most Recent NIH  1a Level of Conscious.: 0 1b LOC Questions: 2 1c LOC Commands: 1 2 Best Gaze: 1 3 Visual: 0 4 Facial Palsy: 0 5a Motor Arm - left: 0 5b Motor Arm - Right: 4 6a Motor Leg - Left: 0 6b Motor Leg - Right: 3 7 Limb Ataxia: 0 8 Sensory: 0 9 Best Language: 3 10 Dysarthria: 2 11  Extinct. and Inatten.: 0 TOTAL: 16   ASSESSMENT/PLAN  [name] is a 59 y.o. male with history of HIV, hypertension, substance abuse and alcohol abuse admitted for acute onset right-sided weakness and aphasia.  He was given TNK to treat stroke and was found to have occlusion of left ICA/MCA, and mechanical thrombectomy was successfully performed.  NIH on Admission 30  Acute Ischemic Infarct:  left MCA territory infarct s/p mechanical thrombectomy and TNK Etiology: Large vessel occlusion Code Stroke CT head hyperdense left MCA ASPECTS 7 CTA head & neck left ICA occlusion in the neck, left MCA origin occluded MRI acute left MCA territory infarct with confluent petechial hemorrhage, cytotoxic edema and mild regional mass effect with trace rightward midline shift 2D Echo EF 50 to 55%, moderately dilated left atrium, mildly dilated right atrium, grossly normal atrial septum Will likely need loop recorder at discharge LDL 55 HgbA1c 5.8 VTE prophylaxis -SCDs No antithrombotic prior to admission, now on aspirin  81 mg daily due to petechial hemorrhage Therapy recommendations:  Pending Disposition: Pending  Hypertension Home meds: Losartan  100 mg daily Stable Blood Pressure Goal: SBP 120-160 for first 24 hours then less than 180   Hyperlipidemia Home meds: Rosuvastatin  20 mg daily, resumed in hospital LDL 55, goal < 70 Continue statin at discharge  Tobacco Abuse Patient smokes 0.25 packs per day for 40 years      Ready  to quit? N/A Nicotine  replacement therapy provided  Substance Abuse Patient uses cocaine UDS positive for Cocaine      Ready to quit? N/A TOC consult for cessation placed  Dysphagia Patient has post-stroke dysphagia, SLP consulted    Diet   DIET - DYS 1 Room service appropriate? No; Fluid consistency: Thin   Advance diet as tolerated  Other Stroke Risk Factors None   Other Active Problems HIV-continue home Biktarvy   Hospital day # 2      Patient remains aphasic with right Hemi plegia.  Speech therapy for swallow eval today and if he fails may need a core track tube.  Mobilize out of bed.  Continue ongoing therapies.   No family at the bedside today during rounds.  Appreciate vascular surgery help.  He will likely need inpatient rehab.  Transfer out of ICU to neurology floor bed.  Hopefully transfer to rehab in a few days.    I personally spent a total of 50 minutes in the care of the patient today including getting/reviewing separately obtained history, performing a medically appropriate exam/evaluation, counseling and educating, placing orders, referring and communicating with other health care professionals, documenting clinical information in the EHR, independently interpreting results, and coordinating care.            Eather Popp, MD Medical Director [facility] St[facility] Pager: [phone] 12/29/2024 2:40 PM   To contact Stroke Continuity provider, please refer to [email or web address]. After hours, contact General Neurology

## 2024-12-29 NOTE — Progress Notes (Signed)
 Physical Therapy Treatment Patient Details Name: Roy [name] MRN: [id number] DOB: [birth year] Today's Date: 12/29/2024   History of Present Illness 58 yo M adm 12/27/24 from ETOH rehab center after fall with Rt weakness, AMS. Pt with Lt ICA/MCA CVA s/p Lt ICA/MCA thrombectomy complicated by Rt CFA occlusion from closure device requiring exploration and endarterectomy with angioplasty. PMHx: cocaine & ETOH abuse, HIV, bipolar disorder, CAD    PT Comments  Pt sitting up in bed eating on arrival with noted pocketing, RN made aware. Pt with improved attempts at communication, smiling, attempting thumbs up and nodding x 2. Pt following single step command for mobility with improved bed and standing transfers. Pt performed well with use of stedy and demonstrated muscle activation of Rt bicep and wrist this date with Rt quad 2+/5. Pt educated for progression, safety and attending to RUE with transfers. Patient will benefit from intensive inpatient follow-up therapy, >3 hours/day  BP 127/72 HR 65    If plan is discharge home, recommend the following: Two people to help with walking and/or transfers;A lot of help with bathing/dressing/bathroom;Assistance with feeding;Assistance with cooking/housework;Direct supervision/assist for financial management;Assist for transportation;Direct supervision/assist for medications management;Supervision due to cognitive status   Can travel by private vehicle        Equipment Recommendations  BSC/3in1;Wheelchair (measurements PT);Wheelchair cushion (measurements PT)    Recommendations for Other Services       Precautions / Restrictions Precautions Precautions: Fall;Other (comment) Recall of Precautions/Restrictions: Impaired Precaution/Restrictions Comments: Rt groin VAC, aphasia, Rt hemiparesis     Mobility  Bed Mobility Overal bed mobility: Needs Assistance Bed Mobility: Supine to Sit     Supine to sit: Min assist     General bed mobility  comments: min assist to clear RLE, cues to assist RUE toward EOB and min assist to fully pivot to left side of bed. Pt with good sitting balance once EOB    Transfers Overall transfer level: Needs assistance   Transfers: Sit to/from Stand, Bed to chair/wheelchair/BSC Sit to Stand: Min assist           General transfer comment: min assist to stand from bed with stedy, CGA to rise from stedy, min assist to rise from chair x 3 trials and pt able to static stand for up to 5 sec without UB support with Rt knee blocked. Stedy for pivot bed to chair. Pt able to initiate short steps forward and back at chair with LUE support Transfer via Lift Equipment: Stedy  Ambulation/Gait                   Stairs             Wheelchair Mobility     Tilt Bed    Modified Rankin (Stroke Patients Only) Modified Rankin (Stroke Patients Only) Pre-Morbid Rankin Score: No symptoms Modified Rankin: Severe disability     Balance Overall balance assessment: Needs assistance   Sitting balance-Leahy Scale: Fair       Standing balance-Leahy Scale: Poor Standing balance comment: min assist with RLE block for static standing                            Communication Communication Communication: Impaired Factors Affecting Communication: Difficulty expressing self  Cognition Arousal: Alert Behavior During Therapy: Flat affect   PT - Cognitive impairments: Difficult to assess Difficult to assess due to: Impaired communication  Following commands: Impaired Following commands impaired: Follows one step commands with increased time, Only follows one step commands consistently    Cueing Cueing Techniques: Verbal cues, Gestural cues, Tactile cues  Exercises General Exercises - Lower Extremity Long Arc Quad: AAROM, Right, Seated, Strengthening, 10 reps    General Comments        Pertinent Vitals/Pain Pain Assessment Pain Assessment:  CPOT Facial Expression: Relaxed, neutral Body Movements: Absence of movements Muscle Tension: Relaxed Compliance with ventilator (intubated pts.): N/A Vocalization (extubated pts.): Talking in normal tone or no sound CPOT Total: 0 Pain Intervention(s): Repositioned    Home Living                          Prior Function            PT Goals (current goals can now be found in the care plan section) Progress towards PT goals: Progressing toward goals    Frequency    Min 3X/week      PT Plan      Co-evaluation              AM-PAC PT 6 Clicks Mobility   Outcome Measure  Help needed turning from your back to your side while in a flat bed without using bedrails?: A Lot Help needed moving from lying on your back to sitting on the side of a flat bed without using bedrails?: A Lot Help needed moving to and from a bed to a chair (including a wheelchair)?: Total Help needed standing up from a chair using your arms (e.g., wheelchair or bedside chair)?: A Little Help needed to walk in hospital room?: Total Help needed climbing 3-5 steps with a railing? : Total 6 Click Score: 10    End of Session Equipment Utilized During Treatment: Gait belt Activity Tolerance: Patient tolerated treatment well Patient left: in chair;with call bell/phone within reach;with chair alarm set;with family/visitor present Nurse Communication: Mobility status PT Visit Diagnosis: Other abnormalities of gait and mobility (R26.89);Difficulty in walking, not elsewhere classified (R26.2);Hemiplegia and hemiparesis;Other symptoms and signs involving the nervous system (R29.898) Hemiplegia - Right/Left: Right Hemiplegia - dominant/non-dominant: Dominant Hemiplegia - caused by: Cerebral infarction     Time: 1220-1246 PT Time Calculation (min) (ACUTE ONLY): 26 min  Charges:    $Therapeutic Activity: 23-37 mins PT General Charges $$ ACUTE PT VISIT: 1 Visit                     [name],  PT Acute Rehabilitation Services Office: [phone]    Lenoard NOVAK Inette Doubrava 12/29/2024, 1:49 PM

## 2024-12-29 NOTE — Progress Notes (Addendum)
 " Progress Note    12/29/2024 6:56 AM 2 Days Post-Op  Subjective:  awake, no complaints  Tm 99.7 now 99.2   Vitals:   12/29/24 0500 12/29/24 0600  BP: 127/75 (!) 150/79  Pulse: (!) 44 (!) 53  Resp: 19 (!) 22  Temp:    SpO2: 98% 98%    Physical Exam: General:  no distress Cardiac:  regular Lungs:  non labored Incisions:  right groin with prevena with good seal Extremities:  easily palpable right PT pulse and right calf and anterior compartments are soft and non tender   CBC    Component Value Date/Time   WBC 9.0 12/28/2024 0510   RBC 3.17 (L) 12/28/2024 0510   HGB 10.8 (L) 12/28/2024 0510   HGB 14.4 01/14/2024 1043   HCT 31.5 (L) 12/28/2024 0510   HCT 42.6 01/14/2024 1043   PLT 169 12/28/2024 0510   PLT 220 01/14/2024 1043   MCV 99.4 12/28/2024 0510   MCV 101 (H) 01/14/2024 1043   MCV 99 01/06/2015 1505   MCH 34.1 (H) 12/28/2024 0510   MCHC 34.3 12/28/2024 0510   RDW 12.3 12/28/2024 0510   RDW 11.9 01/14/2024 1043   RDW 13.0 01/06/2015 1505   LYMPHSABS 0.9 12/27/2024 0705   LYMPHSABS 1.6 01/14/2024 1043   LYMPHSABS 2.6 01/06/2015 1505   MONOABS 0.4 12/27/2024 0705   MONOABS 0.3 01/06/2015 1505   EOSABS 0.0 12/27/2024 0705   EOSABS 0.1 01/14/2024 1043   EOSABS 0.1 01/06/2015 1505   BASOSABS 0.0 12/27/2024 0705   BASOSABS 0.0 01/14/2024 1043   BASOSABS 0.0 01/06/2015 1505    BMET    Component Value Date/Time   NA 138 12/28/2024 0510   NA 141 01/06/2015 1505   K 3.9 12/28/2024 0510   K 3.9 01/06/2015 1505   CL 107 12/28/2024 0510   CL 107 01/06/2015 1505   CO2 21 (L) 12/28/2024 0510   CO2 29 01/06/2015 1505   GLUCOSE 102 (H) 12/28/2024 0510   GLUCOSE 111 (H) 01/06/2015 1505   BUN 15 12/28/2024 0510   BUN 16 01/06/2015 1505   CREATININE 1.10 12/28/2024 0510   CREATININE 1.28 01/06/2015 1505   CALCIUM  8.2 (L) 12/28/2024 0510   CALCIUM  9.0 01/06/2015 1505   GFRNONAA >60 12/28/2024 0510   GFRNONAA >60 01/06/2015 1505   GFRNONAA >60 05/10/2013  0535   GFRAA 54 (L) 07/12/2020 1511   GFRAA >60 01/06/2015 1505   GFRAA >60 05/10/2013 0535    INR    Component Value Date/Time   INR 1.0 12/27/2024 0705     Intake/Output Summary (Last 24 hours) at 12/29/2024 0656 Last data filed at 12/29/2024 0300 Gross per 24 hour  Intake 280 ml  Output 1250 ml  Net -970 ml      Assessment/Plan:  59 y.o. male is s/p:  Right iliofemoral embolectomy, right iliofemoral endarterectomy with saphenous vein angioplasty on 12/27/2024 by Dr. Lanis  2 Days Post-Op   -pt doing well from vascular standpoint.  Continues to have easily palpable right PT pulse and there is no evidence of compartment syndrome.   -out of bed and mobilize per primary team -leave Prevena for one week    Lucie Apt, PA-C [facility] [phone] 12/29/2024 6:56 AM  VASCULAR STAFF ADDENDUM: I have independently interviewed and examined the patient. I agree with the above.  Patient moving his right leg today, which was more than yesterday. Excellent, palpable posterior tibial pulse. Prevena vacuum dressing to be taken on  Friday by our team.  Will see Friday.  Fonda FORBES Rim MD [facility] Phone Number: [phone] 12/29/2024 10:56 AM    "

## 2024-12-29 NOTE — Plan of Care (Signed)
" °  Problem: Education: Goal: Knowledge of disease or condition will improve Outcome: Progressing   Problem: Ischemic Stroke/TIA Tissue Perfusion: Goal: Complications of ischemic stroke/TIA will be minimized Outcome: Progressing   Problem: Coping: Goal: Will identify appropriate support needs Outcome: Progressing   Problem: Health Behavior/Discharge Planning: Goal: Goals will be collaboratively established with patient/family Outcome: Progressing   Problem: Self-Care: Goal: Ability to participate in self-care as condition permits will improve Outcome: Progressing   Problem: Nutrition: Goal: Risk of aspiration will decrease Outcome: Progressing Goal: Dietary intake will improve Outcome: Progressing   Problem: Skin Integrity: Goal: Demonstrates signs of wound healing without infection Outcome: Progressing   Problem: Activity: Goal: Risk for activity intolerance will decrease Outcome: Progressing   Problem: Nutrition: Goal: Adequate nutrition will be maintained Outcome: Progressing   Problem: Coping: Goal: Level of anxiety will decrease Outcome: Progressing   Problem: Safety: Goal: Ability to remain free from injury will improve Outcome: Progressing   Problem: Skin Integrity: Goal: Risk for impaired skin integrity will decrease Outcome: Progressing   "

## 2024-12-30 ENCOUNTER — Encounter (HOSPITAL_COMMUNITY): Payer: Self-pay | Admitting: Internal Medicine

## 2024-12-30 ENCOUNTER — Other Ambulatory Visit: Payer: Self-pay

## 2024-12-30 DIAGNOSIS — I69391 Dysphagia following cerebral infarction: Secondary | ICD-10-CM | POA: Diagnosis not present

## 2024-12-30 DIAGNOSIS — B20 Human immunodeficiency virus [HIV] disease: Secondary | ICD-10-CM | POA: Diagnosis not present

## 2024-12-30 DIAGNOSIS — E785 Hyperlipidemia, unspecified: Secondary | ICD-10-CM | POA: Diagnosis not present

## 2024-12-30 DIAGNOSIS — F17210 Nicotine dependence, cigarettes, uncomplicated: Secondary | ICD-10-CM | POA: Diagnosis not present

## 2024-12-30 DIAGNOSIS — I779 Disorder of arteries and arterioles, unspecified: Secondary | ICD-10-CM | POA: Diagnosis not present

## 2024-12-30 DIAGNOSIS — R29715 NIHSS score 15: Secondary | ICD-10-CM | POA: Diagnosis not present

## 2024-12-30 DIAGNOSIS — I63512 Cerebral infarction due to unspecified occlusion or stenosis of left middle cerebral artery: Secondary | ICD-10-CM | POA: Diagnosis not present

## 2024-12-30 DIAGNOSIS — F1410 Cocaine abuse, uncomplicated: Secondary | ICD-10-CM | POA: Diagnosis not present

## 2024-12-30 DIAGNOSIS — R233 Spontaneous ecchymoses: Secondary | ICD-10-CM | POA: Diagnosis not present

## 2024-12-30 DIAGNOSIS — I10 Essential (primary) hypertension: Secondary | ICD-10-CM | POA: Diagnosis not present

## 2024-12-30 MED ORDER — ADULT MULTIVITAMIN W/MINERALS CH
1.0000 | ORAL_TABLET | Freq: Every day | ORAL | Status: AC
  Administered 2024-12-30 – 2025-01-22 (×24): 1 via ORAL
  Filled 2024-12-30 (×24): qty 1

## 2024-12-30 MED ORDER — ARFORMOTEROL TARTRATE 15 MCG/2ML IN NEBU: 15.0000 ug | INHALATION_SOLUTION | Freq: Two times a day (BID) | RESPIRATORY_TRACT | Status: DC

## 2024-12-30 MED ORDER — BUDESONIDE 0.25 MG/2ML IN SUSP: 0.2500 mg | Freq: Two times a day (BID) | RESPIRATORY_TRACT | Status: DC

## 2024-12-30 MED ORDER — THIAMINE MONONITRATE 100 MG PO TABS
100.0000 mg | ORAL_TABLET | Freq: Every day | ORAL | Status: AC
  Administered 2024-12-30 – 2025-01-22 (×24): 100 mg via ORAL
  Filled 2024-12-30 (×24): qty 1

## 2024-12-30 MED ORDER — PANTOPRAZOLE SODIUM 40 MG PO TBEC: 40.0000 mg | DELAYED_RELEASE_TABLET | Freq: Every day | ORAL | Status: DC

## 2024-12-30 MED ORDER — FOLIC ACID 1 MG PO TABS
1.0000 mg | ORAL_TABLET | Freq: Every day | ORAL | Status: AC
  Administered 2024-12-30 – 2025-01-22 (×24): 1 mg via ORAL
  Filled 2024-12-30 (×24): qty 1

## 2024-12-30 NOTE — Progress Notes (Incomplete)
 [employer] phone keys

## 2024-12-30 NOTE — Progress Notes (Addendum)
 STROKE TEAM PROGRESS NOTE    SIGNIFICANT HOSPITAL EVENTS 1/11: Patient admitted with aphasia and right-sided weakness, given TNK and mechanical thrombectomy performed.  INTERIM HISTORY/SUBJECTIVE Patient is seen in his room with no family at the bedside.  He remains hemodynamically stable and afebrile.  He continues to have expressive aphasia but is able to follow one-step commands. OBJECTIVE  CBC    Component Value Date/Time   WBC 9.0 12/28/2024 0510   RBC 3.17 (L) 12/28/2024 0510   HGB 10.8 (L) 12/28/2024 0510   HGB 14.4 01/14/2024 1043   HCT 31.5 (L) 12/28/2024 0510   HCT 42.6 01/14/2024 1043   PLT 169 12/28/2024 0510   PLT 220 01/14/2024 1043   MCV 99.4 12/28/2024 0510   MCV 101 (H) 01/14/2024 1043   MCV 99 01/06/2015 1505   MCH 34.1 (H) 12/28/2024 0510   MCHC 34.3 12/28/2024 0510   RDW 12.3 12/28/2024 0510   RDW 11.9 01/14/2024 1043   RDW 13.0 01/06/2015 1505   LYMPHSABS 0.9 12/27/2024 0705   LYMPHSABS 1.6 01/14/2024 1043   LYMPHSABS 2.6 01/06/2015 1505   MONOABS 0.4 12/27/2024 0705   MONOABS 0.3 01/06/2015 1505   EOSABS 0.0 12/27/2024 0705   EOSABS 0.1 01/14/2024 1043   EOSABS 0.1 01/06/2015 1505   BASOSABS 0.0 12/27/2024 0705   BASOSABS 0.0 01/14/2024 1043   BASOSABS 0.0 01/06/2015 1505    BMET    Component Value Date/Time   NA 138 12/28/2024 0510   NA 141 01/06/2015 1505   K 3.9 12/28/2024 0510   K 3.9 01/06/2015 1505   CL 107 12/28/2024 0510   CL 107 01/06/2015 1505   CO2 21 (L) 12/28/2024 0510   CO2 29 01/06/2015 1505   GLUCOSE 102 (H) 12/28/2024 0510   GLUCOSE 111 (H) 01/06/2015 1505   BUN 15 12/28/2024 0510   BUN 16 01/06/2015 1505   CREATININE 1.10 12/28/2024 0510   CREATININE 1.28 01/06/2015 1505   CALCIUM  8.2 (L) 12/28/2024 0510   CALCIUM  9.0 01/06/2015 1505   GFRNONAA >60 12/28/2024 0510   GFRNONAA >60 01/06/2015 1505   GFRNONAA >60 05/10/2013 0535    IMAGING past 24 hours No results found.   Vitals:   12/30/24 0314 12/30/24 0429  12/30/24 0514 12/30/24 0817  BP: 120/74 (!) 147/84 120/76 126/85  Pulse:  (!) 51 (!) 43 (!) 51  Resp:  18  19  Temp: 97.9 F (36.6 C) 97.9 F (36.6 C)  97.9 F (36.6 C)  TempSrc:    Oral  SpO2:  96% 97% 97%  Weight:         PHYSICAL EXAM General:  Alert, well-nourished, well-developed patient in no acute distress Psych:  Mood and affect appropriate for situation CV: Regular rate and rhythm on monitor Respiratory:  Regular, unlabored respirations on room air   NEURO:  Mental Status: Patient remains globally aphasic but responds to name and intermittently follows simple commands.  He will mimic Speech/Language: No verbal output, some vocalizations but no recognizable words  Cranial Nerves:  II: PERRL.  III, IV, VI: Left gaze deviation, able to go to midline VII: Face is symmetrical resting  VIII: hearing intact to voice. XII: tongue is midline without fasciculations. Motor: Able to move left upper and lower extremities with antigravity strength, able to move right upper extremity but cannot break gravity, moves right lower extremity but also does not break gravity Tone: is normal and bulk is normal Sensation- Intact to light touch bilaterally.  Coordination: Unable to perform  Gait- deferred  Most Recent NIH  1a Level of Conscious.: 0 1b LOC Questions: 2 1c LOC Commands: 1 2 Best Gaze: 1 3 Visual: 0 4 Facial Palsy: 0 5a Motor Arm - left: 0 5b Motor Arm - Right: 3 6a Motor Leg - Left: 0 6b Motor Leg - Right: 3 7 Limb Ataxia: 0 8 Sensory: 0 9 Best Language: 3 10 Dysarthria: 2 11 Extinct. and Inatten.: 0 TOTAL: 15   ASSESSMENT/PLAN  Roy [name] is a 59 y.o. male with history of HIV, hypertension, substance abuse and alcohol abuse admitted for acute onset right-sided weakness and aphasia.  He was given TNK to treat stroke and was found to have occlusion of left ICA/MCA, and mechanical thrombectomy was successfully performed.  NIH on Admission 30  Acute  Ischemic Infarct:  left MCA territory infarct s/p mechanical thrombectomy and TNK Etiology: Large vessel occlusion Code Stroke CT head hyperdense left MCA ASPECTS 7 CTA head & neck left ICA occlusion in the neck, left MCA origin occluded MRI acute left MCA territory infarct with confluent petechial hemorrhage, cytotoxic edema and mild regional mass effect with trace rightward midline shift 2D Echo EF 50 to 55%, moderately dilated left atrium, mildly dilated right atrium, grossly normal atrial septum Will likely need loop recorder at discharge LDL 55 HgbA1c 5.8 VTE prophylaxis -SCDs No antithrombotic prior to admission, now on aspirin  81 mg daily due to petechial hemorrhage Therapy recommendations:  CIR Disposition: Pending  Hypertension Home meds: Losartan  100 mg daily Stable Blood Pressure Goal: SBP 120-160 for first 24 hours then less than 180   Hyperlipidemia Home meds: Rosuvastatin  20 mg daily, resumed in hospital LDL 55, goal < 70 Continue statin at discharge  Tobacco Abuse Patient smokes 0.25 packs per day for 40 years      Ready to quit? N/A Nicotine  replacement therapy provided  Substance Abuse Patient uses cocaine UDS positive for Cocaine      Ready to quit? N/A TOC consult for cessation placed  Dysphagia Patient has post-stroke dysphagia, SLP consulted    Diet   DIET - DYS 1 Room service appropriate? No; Fluid consistency: Thin   Advance diet as tolerated  Other Stroke Risk Factors None   Other Active Problems HIV-continue home Biktarvy   Hospital day # 3  Patient seen by NP with MD, MD to edit note as needed. Cortney E Everitt Clint Kill , MSN, AGACNP-BC [facility] See Amion for schedule and pager information 12/30/2024 10:59 AM  I have personally obtained history,examined this patient, reviewed notes, independently viewed imaging studies, participated in medical decision making and plan of care.ROS completed by me personally and pertinent  positives fully documented  I have made any additions or clarifications directly to the above note. Agree with note above.  Patient is medically stable to be transferred to rehabilitation when bed available  Eather Popp, MD Medical Director [facility] Pager: [phone] 12/30/2024 3:25 PM   To contact Stroke Continuity provider, please refer to [email or web address]. After hours, contact General Neurology

## 2024-12-30 NOTE — PMR Pre-admission (Shared)
 PMR Admission Coordinator Pre-Admission Assessment  Patient: Roy [name] is an 59 y.o., male MRN: [id number] DOB: 09-17-1966 Height:   Weight: 81.2 kg  Insurance Information HMO:     PPO:      PCP:      IPA:      80/20:      OTHER:  PRIMARY: [employer]      Policy#: [id number]       Subscriber:  CM Name: ***      Phone#: ***     Fax#: *** Pre-Cert#: ***      Employer: *** Benefits:  Phone #: ***     Name: *** Eustacio. Date: ***     Deduct: ***      Out of Pocket Max: ***      Life Max: *** CIR: ***      SNF: *** Outpatient: ***     Co-Pay: *** Home Health: ***      Co-Pay: *** DME: ***     Co-Pay: *** Providers: *** SECONDARY:       Policy#:      Phone#:   Financial Counselor:      Phone#:   The Best Boy for patients in Inpatient Rehabilitation Facilities with attached Privacy Act Statement-Health Care Records was provided and verbally reviewed with: Family  Emergency Contact Information Contact Information     Name Relation Home Work Mobile   [place] Sister   [phone]   Phil, Michels   [phone]      Other Contacts     Name Relation Home Work Mobile   Dixon,Elizabeth Significant other   [phone]   Jamarquis, Crull   [phone]       Current Medical History  Patient Admitting Diagnosis: CVA History of Present Illness: Patient is a 59 year old male with a history of hypertension, HIV, alcohol abuse, CAD, cocaine abuse, bipolar disease who presented 12/1124 from an alcohol rehab facility due to altered mental status and concern for code stroke. Stroke team present upon patient's arrival. Blood pressure in the 140s, heart rates are bradycardic and blood sugar was normal. Prior to this patient was ANO x 4 and is highly functional able to do all ADLs. . Pt with Lt ICA/MCA CVA s/p Lt ICA/MCA thrombectomy complicated by Rt CFA occlusion from closure device requiring exploration and endarterectomy  with angioplasty. Pt. Seen by PT/OT/SLP and they recommended CIR to assist return to PLOF.    Complete NIHSS TOTAL: 15  [name] medical record from [facility] has been reviewed by the rehabilitation admission coordinator and physician.  Past Medical History  Past Medical History:  Diagnosis Date   AIDS (acquired immune deficiency syndrome) (HCC)    Angioedema    ARF (acute renal failure)    Arthritis    Asthma    Bipolar disorder (HCC)    BPH (benign prostatic hyperplasia)    Bradycardia    Bronchitis    Cocaine abuse (HCC)    Coronary artery disease    Depression    Dyslipidemia    Dysrhythmia    1st degree heart block/ brady   Genital warts    GERD (gastroesophageal reflux disease)    Hepatitis C    treated   Hernia of abdominal wall    HIV (human immunodeficiency virus infection) (HCC)    HTN (hypertension)    Myocardial infarction (HCC) 2020   pt states stress test showed mild heart attack   Pre-diabetes  Septic hip (HCC)    SVT (supraventricular tachycardia)     Has the patient had major surgery during 100 days prior to admission? No  Family History   family history includes CAD in his mother; Cancer in his brother; Hyperlipidemia in his mother; Hypertension in his mother; Uterine cancer in his mother.  Current Medications Current Medications[1]  Patients Current Diet:  Diet Order             DIET - DYS 1 Room service appropriate? No; Fluid consistency: Thin  Diet effective now                   Precautions / Restrictions Precautions Precautions: Fall, Other (comment) Precaution/Restrictions Comments: Rt groin VAC, aphasia, Rt hemiparesis Restrictions Weight Bearing Restrictions Per Provider Order: No   Has the patient had 2 or more falls or a fall with injury in the past year? Unknown  Prior Activity Level Community (5-7x/wk): Pt. active in the community PTA  Prior Functional Level Self Care: Did the patient need  help bathing, dressing, using the toilet or eating? Independent  Indoor Mobility: Did the patient need assistance with walking from room to room (with or without device)? Independent  Stairs: Did the patient need assistance with internal or external stairs (with or without device)? Independent  Functional Cognition: Did the patient need help planning regular tasks such as shopping or remembering to take medications? Independent  Patient Information Are you of Hispanic, Latino/a,or Spanish origin?: A. No, not of Hispanic, Latino/a, or Spanish origin What is your race?: B. Black or African American Do you need or want an interpreter to communicate with a doctor or health care staff?: 0. No  [name] Response To:  Health Literacy and Transportation Is the patient able to respond to health literacy and transportation needs?: No Health Literacy - How often do you need to have someone help you when you read instructions, pamphlets, or other written material from your doctor or pharmacy?: Never In the past 12 months, has lack of transportation kept you from medical appointments or from getting medications?: No In the past 12 months, has lack of transportation kept you from meetings, work, or from getting things needed for daily living?: No  Home Assistive Devices / Equipment Home Equipment: Agricultural Consultant (2 wheels)  Prior Device Use: Indicate devices/aids used by the patient prior to current illness, exacerbation or injury? None of the above  Current Functional Level Cognition  Arousal/Alertness: Awake/alert Overall Cognitive Status: Difficult to assess Orientation Level: Disoriented X4    Extremity Assessment (includes Sensation/Coordination)  Upper Extremity Assessment: LUE deficits/detail, RUE deficits/detail RUE Deficits / Details: flaccid, responded to noxious stim LUE Deficits / Details: WFL  Lower Extremity Assessment: Defer to PT evaluation RLE Deficits / Details: grossly 2-/5  able to initiate knee flexion and extension EOB, 1/5 hip flexion, partial buckling in standing    ADLs  Overall ADL's : Needs assistance/impaired Eating/Feeding: Total assistance, NPO Grooming: Wash/dry hands, Wash/dry face, Oral care, Minimal assistance, Cueing for sequencing Grooming Details (indicate cue type and reason): seated up in recliner, assistance with toothpaste Upper Body Bathing: Moderate assistance, Sitting Lower Body Bathing: Maximal assistance, +2 for safety/equipment, +2 for physical assistance Upper Body Dressing : Moderate assistance, Sitting Upper Body Dressing Details (indicate cue type and reason): gown change Lower Body Dressing: Maximal assistance, +2 for physical assistance, +2 for safety/equipment Toilet Transfer: Maximal assistance, +2 for physical assistance, +2 for safety/equipment Toileting- Clothing Manipulation and Hygiene: Total assistance Functional mobility  during ADLs: Maximal assistance, +2 for physical assistance, +2 for safety/equipment General ADL Comments: limited by R weakness, communication, cognition, balance. pt stands well but has a hard time shifting weight to pivot or step    Mobility  Overal bed mobility: Needs Assistance Bed Mobility: Supine to Sit Supine to sit: Min assist General bed mobility comments: increased time and cues for sequencing    Transfers  Overall transfer level: Needs assistance Equipment used: Ambulation equipment used Transfers: Sit to/from Stand, Bed to chair/wheelchair/BSC Sit to Stand: Min assist Bed to/from chair/wheelchair/BSC transfer type:: Via Lift equipment Stand pivot transfers: Max assist, +2 physical assistance Transfer via Lift Equipment: Stedy General transfer comment: able to stand in stedy with verbal cues and min assist using LUE    Ambulation / Gait / Stairs / Engineer, Drilling / Balance Dynamic Sitting Balance Sitting balance - Comments: supervision for  safety Balance Overall balance assessment: Needs assistance Sitting-balance support: Feet supported Sitting balance-Leahy Scale: Fair Sitting balance - Comments: supervision for safety Standing balance support: Single extremity supported Standing balance-Leahy Scale: Poor Standing balance comment: stood in stedy with min assist with LUE support    Special considerations/life events  Skin ***   Previous Home Environment (from acute therapy documentation) Living Arrangements: Alone Available Help at Discharge: Family Type of Home: House Home Layout: Two level Home Access: Stairs to enter Entrance Stairs-Rails: Can reach both Entrance Stairs-Number of Steps: 1 Bathroom Shower/Tub: Engineer, Manufacturing Systems: Standard Bathroom Accessibility: Yes How Accessible: Accessible via walker Home Care Services: No Additional Comments: pt was living alone in an apartment without steps PTA. Sister April able to assist at DC as she is currently unemployed but that could change and home information is for her house. Pt was not driving but would walk or bike places  Discharge Living Setting Plans for Discharge Living Setting: [name] home Type of Home at Discharge: House Discharge Home Layout: One level Discharge Home Access: Stairs to enter Entrance Stairs-Rails: Right, Left Discharge Bathroom Shower/Tub: Tub/shower unit Discharge Bathroom Toilet: Standard Discharge Bathroom Accessibility: No Does the patient have any problems obtaining your medications?: No  Social/Family/Support Systems Patient Roles: Other (Comment) Contact Information: Darold (son) Anticipated Caregiver: [phone] Caregiver Availability: 24/7 Discharge Plan Discussed with Primary Caregiver: No Is Caregiver In Agreement with Plan?: No  Goals Patient/Family Goal for Rehab: PT/OT/SLP supervision Expected length of stay: 10-12 days Pt/Family Agrees to Admission and willing to participate: Yes Program  Orientation Provided & Reviewed with Pt/Caregiver Including Roles  & Responsibilities: Yes  Decrease burden of Care through IP rehab admission: Not anticipated  Possible need for SNF placement upon discharge: Not anticipated  Patient Condition: I have reviewed medical records from [facility], spoken with CM, and patient. I met with patient at the bedside for inpatient rehabilitation assessment.  Patient will benefit from ongoing PT, OT, and SLP, can actively participate in 3 hours of therapy a day 5 days of the week, and can make measurable gains during the admission.  Patient will also benefit from the coordinated team approach during an Inpatient Acute Rehabilitation admission.  The patient will receive intensive therapy as well as Rehabilitation physician, nursing, social worker, and care management interventions.  Due to safety, skin/wound care, disease management, medication administration, pain management, and patient education the patient requires 24 hour a day rehabilitation nursing.  The patient is currently *** with mobility and basic ADLs.  Discharge setting and therapy post discharge at  home with home health is anticipated.  Patient has agreed to participate in the Acute Inpatient Rehabilitation Program and will admit {Time; today/tomorrow:10263}.  Preadmission Screen Completed By:  Leita KATHEE Kleine, 12/30/2024 1:53 PM ______________________________________________________________________   Discussed status with Dr. PIERRETTE on *** at *** and received approval for admission today.  Admission Coordinator:  Leita KATHEE Kleine, CCC-SLP, time PIERRETTEPattricia ***   Assessment/Plan: Diagnosis: *** Does the need for close, 24 hr/day Medical supervision in concert with the patient's rehab needs make it unreasonable for this patient to be served in a less intensive setting? {yes_no_potentially:3041433} Co-Morbidities requiring supervision/potential complications: *** Due to {due un:6958565}, does  the patient require 24 hr/day rehab nursing? {yes_no_potentially:3041433} Does the patient require coordinated care of a physician, rehab nurse, PT, OT, and SLP to address physical and functional deficits in the context of the above medical diagnosis(es)? {yes_no_potentially:3041433} Addressing deficits in the following areas: {deficits:3041436} Can the patient actively participate in an intensive therapy program of at least 3 hrs of therapy 5 days a week? {yes_no_potentially:3041433} The potential for patient to make measurable gains while on inpatient rehab is {potential:3041437} Anticipated functional outcomes upon discharge from inpatient rehab: {functional outcomes:304600100} PT, {functional outcomes:304600100} OT, {functional outcomes:304600100} SLP Estimated rehab length of stay to reach the above functional goals is: *** Anticipated discharge destination: {anticipated dc setting:21604} 10. Overall Rehab/Functional Prognosis: {potential:3041437}   MD Signature: ***     [1]  Current Facility-Administered Medications:    0.9 %  sodium chloride  infusion (Manually program via Guardrails IV Fluids), , Intravenous, Once, Bethanie Cough, PA-C   acetaminophen  (TYLENOL ) tablet 650 mg, 650 mg, Oral, Q4H PRN, 650 mg at 12/28/24 1048 **OR** acetaminophen  (TYLENOL ) 160 MG/5ML solution 650 mg, 650 mg, Per Tube, Q4H PRN **OR** acetaminophen  (TYLENOL ) suppository 650 mg, 650 mg, Rectal, Q4H PRN, Eveland, Matthew, PA-C   albuterol  (PROVENTIL ) (2.5 MG/3ML) 0.083% nebulizer solution 2.5 mg, 2.5 mg, Nebulization, Q4H PRN, Eveland, Matthew, PA-C   aspirin  suppository 300 mg, 300 mg, Rectal, Daily **OR** aspirin  chewable tablet 81 mg, 81 mg, Oral, Daily, Rosemarie, Pramod S, MD, 81 mg at 12/30/24 1013   bictegravir-emtricitabine -tenofovir  AF (BIKTARVY ) 50-200-25 MG per tablet 1 tablet, 1 tablet, Oral, BH-q7a, Eveland, Matthew, PA-C, 1 tablet at 12/30/24 9387   Chlorhexidine  Gluconate Cloth 2 % PADS 6 each, 6  each, Topical, Daily, Nichola Zeke HERO, MD, 6 each at 12/30/24 1013   folic acid  (FOLVITE ) tablet 1 mg, 1 mg, Oral, Daily, de Clint Kill, [place] E, NP, 1 mg at 12/30/24 1052   heparin  injection 5,000 Units, 5,000 Units, Subcutaneous, Q8H, Sethi, Pramod S, MD, 5,000 Units at 12/30/24 9387   hydrALAZINE  (APRESOLINE ) injection 10-20 mg, 10-20 mg, Intravenous, Q4H PRN, Eveland, Matthew, PA-C   labetalol  (NORMODYNE ) injection 10-20 mg, 10-20 mg, Intravenous, Q2H PRN, Eveland, Matthew, PA-C   morphine  (PF) 2 MG/ML injection 2-4 mg, 2-4 mg, Intravenous, Q2H PRN, Eveland, Matthew, PA-C   multivitamin with minerals tablet 1 tablet, 1 tablet, Oral, Daily, de Clint Kill, Ritchey E, NP, 1 tablet at 12/30/24 1052   Oral care mouth rinse, 15 mL, Mouth Rinse, PRN, Nichola, Gaurang M, MD, 15 mL at 12/28/24 1600   oxyCODONE -acetaminophen  (PERCOCET/ROXICET) 5-325 MG per tablet 1-2 tablet, 1-2 tablet, Oral, Q4H PRN, Eveland, Matthew, PA-C, 1 tablet at 12/29/24 2131   rosuvastatin  (CRESTOR ) tablet 20 mg, 20 mg, Oral, Daily, Eveland, Matthew, PA-C, 20 mg at 12/30/24 1012   senna-docusate (Senokot-S) tablet 1 tablet, 1 tablet, Oral, QHS PRN, Eveland, Matthew, PA-C   sodium  chloride flush (NS) 0.9 % injection 3 mL, 3 mL, Intravenous, Once, Eveland, Matthew, PA-C   thiamine  (VITAMIN B1) tablet 100 mg, 100 mg, Oral, Daily, de Clint Kill, Augusta E, NP, 100 mg at 12/30/24 1052

## 2024-12-30 NOTE — Plan of Care (Signed)

## 2024-12-30 NOTE — Progress Notes (Signed)
 Occupational Therapy Treatment Patient Details Name: Roy [name] MRN: [id number] DOB: December 27, 1965 Today's Date: 12/30/2024   History of present illness 59 yo M adm 12/27/24 from ETOH rehab center after fall with Rt weakness, AMS. Pt with Lt ICA/MCA CVA s/p Lt ICA/MCA thrombectomy complicated by Rt CFA occlusion from closure device requiring exploration and endarterectomy with angioplasty. PMHx: cocaine & ETOH abuse, HIV, bipolar disorder, CAD   OT comments  Patient nonverbal but able to indicate agreeable to OT treatment. Patient demonstrating good gains with OT treatment with min assist to get to EOB and to stand in stedy for transfer to recliner. Patient requiring cues for sequencing for ADLs with mod assist for UB bathing and dressing due to RUE limitations.  Patient will benefit from intensive inpatient follow-up therapy, >3 hours/day.  Acute OT to continue to follow to address established goals to facilitate DC to next venue of care.        If plan is discharge home, recommend the following:  A lot of help with walking and/or transfers;A lot of help with bathing/dressing/bathroom;Assistance with cooking/housework;Direct supervision/assist for medications management;Direct supervision/assist for financial management;Assist for transportation;Help with stairs or ramp for entrance   Equipment Recommendations  Other (comment) (pending progress)    Recommendations for Other Services      Precautions / Restrictions Precautions Precautions: Fall;Other (comment) Recall of Precautions/Restrictions: Impaired Precaution/Restrictions Comments: Rt groin VAC, aphasia, Rt hemiparesis Restrictions Weight Bearing Restrictions Per Provider Order: No       Mobility Bed Mobility Overal bed mobility: Needs Assistance Bed Mobility: Supine to Sit     Supine to sit: Min assist     General bed mobility comments: increased time and cues for sequencing    Transfers Overall transfer level:  Needs assistance Equipment used: Ambulation equipment used Transfers: Sit to/from Stand, Bed to chair/wheelchair/BSC Sit to Stand: Min assist           General transfer comment: able to stand in stedy with verbal cues and min assist using LUE Transfer via Lift Equipment: Stedy   Balance Overall balance assessment: Needs assistance Sitting-balance support: Feet supported Sitting balance-Leahy Scale: Fair Sitting balance - Comments: supervision for safety   Standing balance support: Single extremity supported Standing balance-Leahy Scale: Poor Standing balance comment: stood in stedy with min assist with LUE support                           ADL either performed or assessed with clinical judgement   ADL Overall ADL's : Needs assistance/impaired     Grooming: Wash/dry hands;Wash/dry face;Oral care;Minimal assistance;Cueing for sequencing Grooming Details (indicate cue type and reason): seated up in recliner, assistance with toothpaste Upper Body Bathing: Moderate assistance;Sitting       Upper Body Dressing : Moderate assistance;Sitting Upper Body Dressing Details (indicate cue type and reason): gown change                        Extremity/Trunk Assessment              Vision       Perception     Praxis     Communication Communication Communication: Impaired Factors Affecting Communication: Difficulty expressing self   Cognition Arousal: Alert Behavior During Therapy: Flat affect Cognition: Difficult to assess Difficult to assess due to: Impaired communication           OT - Cognition Comments: able to follow simple commands  Following commands: Impaired Following commands impaired: Follows one step commands with increased time, Only follows one step commands consistently      Cueing   Cueing Techniques: Verbal cues, Gestural cues, Tactile cues  Exercises      Shoulder Instructions       General  Comments BP seated on EOB 130/99 (109)    Pertinent Vitals/ Pain       Pain Assessment Pain Assessment: Faces Faces Pain Scale: No hurt Pain Intervention(s): Monitored during session  Home Living                                          Prior Functioning/Environment              Frequency  Min 2X/week        Progress Toward Goals  OT Goals(current goals can now be found in the care plan section)  Progress towards OT goals: Progressing toward goals  Acute Rehab OT Goals Patient Stated Goal: unable to state OT Goal Formulation: Patient unable to participate in goal setting Time For Goal Achievement: 01/11/25 Potential to Achieve Goals: Good ADL Goals Pt Will Perform Grooming: with set-up Pt Will Perform Upper Body Dressing: with set-up Pt Will Perform Lower Body Dressing: with min assist Pt Will Transfer to Toilet: with min assist;stand pivot transfer;bedside commode Additional ADL Goal #1: Pt will use RUE as a functional assist during ADL with min A  Plan      Co-evaluation                 AM-PAC OT 6 Clicks Daily Activity     Outcome Measure   Help from another person eating meals?: A Lot Help from another person taking care of personal grooming?: A Little Help from another person toileting, which includes using toliet, bedpan, or urinal?: A Lot Help from another person bathing (including washing, rinsing, drying)?: A Lot Help from another person to put on and taking off regular upper body clothing?: A Lot Help from another person to put on and taking off regular lower body clothing?: A Lot 6 Click Score: 13    End of Session Equipment Utilized During Treatment: Gait belt;Other (comment) Laurent)  OT Visit Diagnosis: Unsteadiness on feet (R26.81);Other abnormalities of gait and mobility (R26.89);Muscle weakness (generalized) (M62.81);Hemiplegia and hemiparesis;Cognitive communication deficit (R41.841) Symptoms and signs involving  cognitive functions: Cerebral infarction Hemiplegia - Right/Left: Right Hemiplegia - dominant/non-dominant: Dominant Hemiplegia - caused by: Cerebral infarction   Activity Tolerance Patient tolerated treatment well   Patient Left in chair;with call bell/phone within reach;with chair alarm set   Nurse Communication Mobility status;Need for lift equipment        Time: 9281-9255 OT Time Calculation (min): 26 min  Charges: OT General Charges $OT Visit: 1 Visit OT Treatments $Self Care/Home Management : 8-22 mins $Therapeutic Activity: 8-22 mins  Dick Laine, OTA Acute Rehabilitation Services  Office [phone]   Jeb LITTIE Laine 12/30/2024, 8:43 AM

## 2024-12-30 NOTE — Progress Notes (Signed)
 Chaplain responded to [facility] consult for Advance Directive. When chaplain knocked on pt door, he appeared to be dozing in his chair. Chaplain stated his name and he opened his eyes. Chaplain apologized for waking him and provided an introduction, however pt did not make eye direct eye contact or make any acknowledgement of chaplain's presence. Chaplain again attempted to engage pt, but he was unresponsive to chaplain engagement as evidenced by gazing into the distance. Chaplain excused self and checked in with pt's nurse for the day to verify that he is not sufficiently oriented to be able to complete the documents. RN shared that pt's son could engage the conversation and chaplain provided education that the Advance Directives need to be completed by pt.    Please page if pt's mental status changes. We are happy to consult re Advance Directives at that time.  Alan HERO. Davee Lomax, M.Div. [facility] Chaplain Pager [phone] Office [phone]   12/30/24 1043  Spiritual Encounters  Type of Visit Initial  Care provided to: Patient  Conversation partners present during encounter Nurse  Reason for visit Advance directives  Advance Directives (For Healthcare)  Does Patient Have a Medical Advance Directive? Unable to assess, patient is non-responsive or altered mental status

## 2024-12-30 NOTE — Plan of Care (Signed)
  Problem: Ischemic Stroke/TIA Tissue Perfusion: Goal: Complications of ischemic stroke/TIA will be minimized Outcome: Progressing   Problem: Coping: Goal: Will verbalize positive feelings about self Outcome: Progressing   Problem: Nutrition: Goal: Risk of aspiration will decrease Outcome: Progressing

## 2024-12-30 NOTE — Progress Notes (Signed)
 Inpatient Rehab Admissions Coordinator:    CIR following. Pt.'s sister states Pt have two sons that are involved in decision making as well. I spoke with Darold who states he may be able to care for Pt. After CIR and Gram Siedlecki who states that he works and cannot be with Pt. After CIR. Salomon Raddle. Is comfortable with pt. Living with Darold if he decides he can handle it. Darold to discuss with his mother and other family and get back to me.   Leita Kleine, MS, CCC-SLP Rehab Admissions Coordinator  [phone] (celll) [phone] (office)

## 2024-12-31 ENCOUNTER — Encounter (HOSPITAL_COMMUNITY): Payer: Self-pay | Admitting: Internal Medicine

## 2024-12-31 DIAGNOSIS — R29715 NIHSS score 15: Secondary | ICD-10-CM | POA: Diagnosis not present

## 2024-12-31 DIAGNOSIS — R233 Spontaneous ecchymoses: Secondary | ICD-10-CM | POA: Diagnosis not present

## 2024-12-31 DIAGNOSIS — B20 Human immunodeficiency virus [HIV] disease: Secondary | ICD-10-CM | POA: Diagnosis not present

## 2024-12-31 DIAGNOSIS — I10 Essential (primary) hypertension: Secondary | ICD-10-CM | POA: Diagnosis not present

## 2024-12-31 DIAGNOSIS — F17210 Nicotine dependence, cigarettes, uncomplicated: Secondary | ICD-10-CM | POA: Diagnosis not present

## 2024-12-31 DIAGNOSIS — F1410 Cocaine abuse, uncomplicated: Secondary | ICD-10-CM | POA: Diagnosis not present

## 2024-12-31 DIAGNOSIS — E785 Hyperlipidemia, unspecified: Secondary | ICD-10-CM | POA: Diagnosis not present

## 2024-12-31 DIAGNOSIS — I63512 Cerebral infarction due to unspecified occlusion or stenosis of left middle cerebral artery: Secondary | ICD-10-CM | POA: Diagnosis not present

## 2024-12-31 DIAGNOSIS — I69391 Dysphagia following cerebral infarction: Secondary | ICD-10-CM | POA: Diagnosis not present

## 2024-12-31 DIAGNOSIS — I779 Disorder of arteries and arterioles, unspecified: Secondary | ICD-10-CM | POA: Diagnosis not present

## 2024-12-31 DIAGNOSIS — G936 Cerebral edema: Secondary | ICD-10-CM | POA: Diagnosis not present

## 2024-12-31 LAB — BPAM RBC
Blood Product Expiration Date: 202602032359
Blood Product Expiration Date: 202602032359
ISSUE DATE / TIME: 202601111252
ISSUE DATE / TIME: 202601111252
Unit Type and Rh: 5100
Unit Type and Rh: 5100

## 2024-12-31 LAB — CBC
HCT: 31.2 % — ABNORMAL LOW (ref 39.0–52.0)
Hemoglobin: 10.8 g/dL — ABNORMAL LOW (ref 13.0–17.0)
MCH: 34.3 pg — ABNORMAL HIGH (ref 26.0–34.0)
MCHC: 34.6 g/dL (ref 30.0–36.0)
MCV: 99 fL (ref 80.0–100.0)
Platelets: 137 K/uL — ABNORMAL LOW (ref 150–400)
RBC: 3.15 MIL/uL — ABNORMAL LOW (ref 4.22–5.81)
RDW: 11.9 % (ref 11.5–15.5)
WBC: 5.1 K/uL (ref 4.0–10.5)
nRBC: 0 % (ref 0.0–0.2)

## 2024-12-31 LAB — TYPE AND SCREEN
ABO/RH(D): O POS
Antibody Screen: NEGATIVE
Unit division: 0
Unit division: 0

## 2024-12-31 LAB — BASIC METABOLIC PANEL WITH GFR
Anion gap: 8 (ref 5–15)
BUN: 19 mg/dL (ref 6–20)
CO2: 25 mmol/L (ref 22–32)
Calcium: 8.4 mg/dL — ABNORMAL LOW (ref 8.9–10.3)
Chloride: 105 mmol/L (ref 98–111)
Creatinine, Ser: 1.14 mg/dL (ref 0.61–1.24)
GFR, Estimated: >60 mL/min
Glucose, Bld: 99 mg/dL (ref 70–99)
Potassium: 3.6 mmol/L (ref 3.5–5.1)
Sodium: 138 mmol/L (ref 135–145)

## 2024-12-31 NOTE — Plan of Care (Signed)
" °  Problem: Education: Goal: Knowledge of disease or condition will improve 12/31/2024 2350 by Peri Magdalena LABOR, RN Outcome: Progressing 12/31/2024 2350 by Peri Magdalena LABOR, RN Outcome: Progressing Goal: Knowledge of secondary prevention will improve (MUST DOCUMENT ALL) 12/31/2024 2350 by Peri Magdalena LABOR, RN Outcome: Progressing 12/31/2024 2350 by Peri Magdalena LABOR, RN Outcome: Progressing Goal: Knowledge of patient specific risk factors will improve (DELETE if not current risk factor) 12/31/2024 2350 by Peri Magdalena LABOR, RN Outcome: Progressing 12/31/2024 2350 by Peri Magdalena LABOR, RN Outcome: Progressing   Problem: Ischemic Stroke/TIA Tissue Perfusion: Goal: Complications of ischemic stroke/TIA will be minimized 12/31/2024 2350 by Peri Magdalena LABOR, RN Outcome: Progressing 12/31/2024 2350 by Peri Magdalena LABOR, RN Outcome: Progressing   Problem: Self-Care: Goal: Ability to communicate needs accurately will improve 12/31/2024 2350 by Peri Magdalena LABOR, RN Outcome: Progressing 12/31/2024 2350 by Peri Magdalena LABOR, RN Outcome: Progressing   Problem: Nutrition: Goal: Risk of aspiration will decrease 12/31/2024 2350 by Peri Magdalena LABOR, RN Outcome: Progressing 12/31/2024 2350 by Peri Magdalena LABOR, RN Outcome: Progressing   Problem: Education: Goal: Knowledge of the prescribed therapeutic regimen will improve 12/31/2024 2350 by Peri Magdalena LABOR, RN Outcome: Progressing 12/31/2024 2350 by Peri Magdalena LABOR, RN Outcome: Progressing   Problem: Skin Integrity: Goal: Demonstrates signs of wound healing without infection 12/31/2024 2350 by Peri Magdalena LABOR, RN Outcome: Progressing 12/31/2024 2350 by Peri Magdalena LABOR, RN Outcome: Progressing   Problem: Education: Goal: Knowledge of General Education information will improve Description: Including pain rating scale, medication(s)/side effects and non-pharmacologic comfort measures 12/31/2024 2350 by Peri Magdalena LABOR, RN Outcome: Progressing 12/31/2024 2350 by  Peri Magdalena LABOR, RN Outcome: Progressing   Problem: Activity: Goal: Risk for activity intolerance will decrease 12/31/2024 2350 by Peri Magdalena LABOR, RN Outcome: Progressing 12/31/2024 2350 by Peri Magdalena LABOR, RN Outcome: Progressing   Problem: Coping: Goal: Level of anxiety will decrease 12/31/2024 2350 by Peri Magdalena LABOR, RN Outcome: Progressing 12/31/2024 2350 by Peri Magdalena LABOR, RN Outcome: Progressing   Problem: Pain Managment: Goal: General experience of comfort will improve and/or be controlled 12/31/2024 2350 by Peri Magdalena LABOR, RN Outcome: Progressing 12/31/2024 2350 by Peri Magdalena LABOR, RN Outcome: Progressing   Problem: Safety: Goal: Ability to remain free from injury will improve 12/31/2024 2350 by Peri Magdalena LABOR, RN Outcome: Progressing 12/31/2024 2350 by Peri Magdalena LABOR, RN Outcome: Progressing   Problem: Skin Integrity: Goal: Risk for impaired skin integrity will decrease 12/31/2024 2350 by Peri Magdalena LABOR, RN Outcome: Progressing 12/31/2024 2350 by Peri Magdalena LABOR, RN Outcome: Progressing   "

## 2024-12-31 NOTE — Plan of Care (Signed)

## 2024-12-31 NOTE — Progress Notes (Signed)
 ED C does not have pt belongings 4NICU does not have pt belongings

## 2024-12-31 NOTE — TOC Progression Note (Signed)
 Transition of Care [facility]) - Progression Note    Patient Details  Name: Roy [name] MRN: [id number] Date of Birth: [birth year]  [facility]) CM/SW Contact  Andrez JULIANNA George, RN Phone Number: 12/31/2024, 11:09 AM  Clinical Narrative:     CIR working on support for CIR admission. CM spoke to pts sister, April (with pt permission). Questions answered.  IP Care management following.  Expected Discharge Plan: IP Rehab Facility                 Expected Discharge Plan and Services                                               Social Drivers of Health (SDOH) Interventions SDOH Screenings   Food Insecurity: No Food Insecurity (06/03/2024)  Housing: Patient Declined (05/04/2024)  Transportation Needs: Unmet Transportation Needs (06/03/2024)  Utilities: At Risk (06/03/2024)  Depression (PHQ2-9): Low Risk (06/08/2024)  Recent Concern: Depression (PHQ2-9) - High Risk (06/07/2024)  Financial Resource Strain: Medium Risk (01/20/2024)   Received from [facility]  Tobacco Use: High Risk (12/11/2024)  Health Literacy: Low Risk  (07/02/2022)   Received from [facility]    Readmission Risk Interventions     No data to display

## 2024-12-31 NOTE — Progress Notes (Signed)
 Physical Therapy Treatment Patient Details Name: Roy [name] MRN: [id number] DOB: [birth year] Today's Date: 12/31/2024   History of Present Illness 59 yo M adm 12/27/24 from ETOH rehab center after fall with Rt weakness, AMS. Pt with Lt ICA/MCA CVA s/p Lt ICA/MCA thrombectomy complicated by Rt CFA occlusion from closure device requiring exploration and endarterectomy with angioplasty. Negative pressure dressing Rt groin  PMHx: cocaine & ETOH abuse, HIV, bipolar disorder, CAD    PT Comments  Patient sleepy, but easily aroused. Noted yes/no responses are not consistent. Patient continues to be limited by Rt hemiparesis. Noted associated movments of RUE when yawning. Patient progressed to transferring bed to chair (on his left) with +2 mod assist for wt-shifting, balance, and advancing RLE. After seated rest, progressed to ambulation with +2 mod assist with bil HHA. Pt required max assist to advance RLE, however when stepping backwards he was able to pull RLE posteriorly from anterior position. Strength RLE improved as noted during AAROM exercises. Will benefit from post-acute therapies >3 hrs/day.     If plan is discharge home, recommend the following: Two people to help with walking and/or transfers;A lot of help with bathing/dressing/bathroom;Assistance with feeding;Assistance with cooking/housework;Direct supervision/assist for financial management;Assist for transportation;Direct supervision/assist for medications management;Supervision due to cognitive status   Can travel by private vehicle        Equipment Recommendations  BSC/3in1;Wheelchair (measurements PT);Wheelchair cushion (measurements PT)    Recommendations for Other Services       Precautions / Restrictions Precautions Precautions: Fall;Other (comment) Recall of Precautions/Restrictions: Impaired Precaution/Restrictions Comments: Rt groin VAC, aphasia, Rt hemiparesis Restrictions Weight Bearing Restrictions Per Provider  Order: No     Mobility  Bed Mobility Overal bed mobility: Needs Assistance Bed Mobility: Rolling, Sidelying to Sit Rolling: Min assist Sidelying to sit: Min assist       General bed mobility comments: roll to left and then lt side to sit with assist to move RLE over EOB and to raise torso    Transfers Overall transfer level: Needs assistance Equipment used: 2 person hand held assist Transfers: Sit to/from Stand, Bed to chair/wheelchair/BSC Sit to Stand: Min assist   Step pivot transfers: Mod assist, +2 physical assistance       General transfer comment: stood with min assist however with RLE hyperextended and supported against bedframe; able to assist to bring knee to neutral without buckling, but with tendency to return to hyperextension; step toward chair on his left with assist to wt-shift to allow each leg to advance; pt stepping LLE independently and able to pull RLE backwards and adducting as moving to chair    Ambulation/Gait Ambulation/Gait assistance: Mod assist, +2 physical assistance Gait Distance (Feet): 2 Feet Assistive device: 2 person hand held assist Gait Pattern/deviations: Step-to pattern, Decreased step length - right, Decreased dorsiflexion - right, Decreased weight shift to right, Knee hyperextension - right   Gait velocity interpretation: <1.31 ft/sec, indicative of household ambulator   General Gait Details: required mod assist of 2 to wt shift to his Left and to advance RLE in step forward; guarding Rt knee as wt shift to rt and step LLE forward. Stepping backwards back to chair pt able to advance LLE independently and then pull RLE backwards towards chair wiht assist for wt-shift   Stairs             Wheelchair Mobility     Tilt Bed    Modified Rankin (Stroke Patients Only) Modified Rankin (Stroke Patients Only) Pre-Morbid Rankin Score:  No symptoms Modified Rankin: Moderately severe disability     Balance Overall balance assessment:  Needs assistance Sitting-balance support: Feet supported Sitting balance-Leahy Scale: Fair Sitting balance - Comments: supervision for safety   Standing balance support: Single extremity supported Standing balance-Leahy Scale: Poor                              Communication Communication Communication: Impaired Factors Affecting Communication: Difficulty expressing self  Cognition Arousal: Alert Behavior During Therapy: Flat affect   PT - Cognitive impairments: Difficult to assess Difficult to assess due to: Impaired communication                       Following commands: Impaired Following commands impaired: Follows one step commands with increased time, Only follows one step commands consistently    Cueing Cueing Techniques: Verbal cues, Gestural cues, Tactile cues  Exercises General Exercises - Lower Extremity Ankle Circles/Pumps: AAROM, Right, 5 reps Long Arc Quad: AAROM, Right, Seated, Strengthening, 10 reps    General Comments General comments (skin integrity, edema, etc.): Sister present      Pertinent Vitals/Pain Pain Assessment Pain Assessment: Faces Faces Pain Scale: No hurt    Home Living                          Prior Function            PT Goals (current goals can now be found in the care plan section) Acute Rehab PT Goals Patient Stated Goal: walk Time For Goal Achievement: 01/11/25 Potential to Achieve Goals: Good Progress towards PT goals: Progressing toward goals    Frequency    Min 3X/week      PT Plan      Co-evaluation              AM-PAC PT 6 Clicks Mobility   Outcome Measure  Help needed turning from your back to your side while in a flat bed without using bedrails?: A Lot Help needed moving from lying on your back to sitting on the side of a flat bed without using bedrails?: A Lot Help needed moving to and from a bed to a chair (including a wheelchair)?: Total Help needed standing up  from a chair using your arms (e.g., wheelchair or bedside chair)?: A Little Help needed to walk in hospital room?: Total Help needed climbing 3-5 steps with a railing? : Total 6 Click Score: 10    End of Session Equipment Utilized During Treatment: Gait belt Activity Tolerance: Patient tolerated treatment well Patient left: in chair;with call bell/phone within reach;with chair alarm set;with family/visitor present Nurse Communication: Mobility status PT Visit Diagnosis: Other abnormalities of gait and mobility (R26.89);Difficulty in walking, not elsewhere classified (R26.2);Hemiplegia and hemiparesis;Other symptoms and signs involving the nervous system (R29.898) Hemiplegia - Right/Left: Right Hemiplegia - dominant/non-dominant: Dominant Hemiplegia - caused by: Cerebral infarction     Time: 8657-8596 PT Time Calculation (min) (ACUTE ONLY): 21 min  Charges:    $Neuromuscular Re-education: 8-22 mins PT General Charges $$ ACUTE PT VISIT: 1 Visit                      Macario RAMAN, PT Acute Rehabilitation Services  Office [phone]    Macario SHAUNNA Soja 12/31/2024, 2:29 PM

## 2024-12-31 NOTE — Progress Notes (Signed)
 Inpatient Rehab Admissions Coordinator:    CIR following. Attempted to call son Darold to see if he has made a decision on whether or not he can provide caregiver support. VM is full but I sent a text with request for callback. Per TOC, Pt.'s sister states she will try to get family to come together for a plan to provide 24/7 support after CIR.   Leita Kleine, MS, CCC-SLP Rehab Admissions Coordinator  [phone] (celll) [phone] (office)

## 2024-12-31 NOTE — [place] FL2 (Signed)
 " [place]  MEDICAID FL2 LEVEL OF CARE FORM     IDENTIFICATION  Patient Name: Roy [name] Birthdate: May 28, 1966 Sex: male Admission Date (Current Location): 12/27/2024  [facility] and [place] Number:  Chiropodist and Address:  [facility] [place]. [facility], [address], [place], [place] [address]      Provider Number: [id number]  Attending Physician Name and Address:  Stroke, Md, MD  Relative Name and Phone Number:       Current Level of Care: Hospital Recommended Level of Care: Skilled Nursing Facility Prior Approval Number:    Date Approved/Denied:   PASRR Number: [id number]  Discharge Plan: SNF    Current Diagnoses: Patient Active Problem List   Diagnosis Date Noted   Stroke [facility]) 12/27/2024   Stroke (cerebrum) (HCC) 12/27/2024   Polysubstance dependence (HCC) 06/03/2024   Generalized abdominal pain 05/04/2024   Gastroenteritis 05/04/2024   Impaired ambulation 05/03/2024   AKI (acute kidney injury) 05/03/2024   Suicidal thoughts 06/16/2023   Cocaine abuse with cocaine-induced mood disorder (HCC) 11/13/2020   Allergic rhinitis 06/21/2020   GERD (gastroesophageal reflux disease) 06/21/2020   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   Arthritis pain, hip 05/18/2020   Status post total hip replacement, 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   Angioedema 07/24/2019   Chest pain 05/27/2018   ARF (acute renal failure) 04/08/2018   Malingering 03/07/2017   Cocaine dependence (HCC) 02/19/2017   Substance induced mood disorder (HCC) 02/19/2017   Alcohol use disorder, severe, dependence (HCC) 12/27/2016   HIV disease (HCC) 10/26/2016   HTN (hypertension) 10/26/2016   Dyslipidemia 10/26/2016   BPH (benign prostatic hyperplasia) 10/26/2016   Constipation 10/26/2016   Acquired hallux rigidus of right foot 07/16/2016   Arthritis 10/25/2014   Genital warts 08/25/2013    Orientation RESPIRATION BLADDER Height & Weight     Self, Place  Normal Incontinent Weight: 179 lb 0.2 oz  (81.2 kg) Height:     BEHAVIORAL SYMPTOMS/MOOD NEUROLOGICAL BOWEL NUTRITION STATUS      Incontinent Diet (see DC summary)  AMBULATORY STATUS COMMUNICATION OF NEEDS Skin   Extensive Assist Non-Verbally Surgical wounds, Wound Vac (closed right groin; prevena wound vac)                       Personal Care Assistance Level of Assistance  Bathing, Feeding, Dressing Bathing Assistance: Maximum assistance Feeding assistance: Maximum assistance Dressing Assistance: Maximum assistance     Functional Limitations Info  Speech     Speech Info: Impaired (mute; nods/gestures appropriately)    SPECIAL CARE FACTORS FREQUENCY  PT (By licensed PT), OT (By licensed OT), Speech therapy     PT Frequency: 5x/wk OT Frequency: 5x/wk     Speech Therapy Frequency: 5x/wk      Contractures Contractures Info: Not present    Additional Factors Info  Code Status, Allergies Code Status Info: Full Allergies Info: Amlodipine , Lisinopril , Bee Pollen, Lactose, Pollen Extract, Statins, Yellow [place]et Venom           Current Medications (12/31/2024):  This is the current hospital active medication list Current Facility-Administered Medications  Medication Dose Route Frequency Provider Last Rate Last Admin   0.9 %  sodium chloride  infusion (Manually program via Guardrails IV Fluids)   Intravenous Once Bethanie Cough, PA-C       acetaminophen  (TYLENOL ) tablet 650 mg  650 mg Oral Q4H PRN Bethanie Cough, PA-C   650 mg at 12/28/24 1048   Or   acetaminophen  (TYLENOL ) 160 MG/5ML solution  650 mg  650 mg Per Tube Q4H PRN Bethanie Cough, PA-C       Or   acetaminophen  (TYLENOL ) suppository 650 mg  650 mg Rectal Q4H PRN Bethanie Cough, PA-C       albuterol  (PROVENTIL ) (2.5 MG/3ML) 0.083% nebulizer solution 2.5 mg  2.5 mg Nebulization Q4H PRN Bethanie Cough, PA-C       aspirin  suppository 300 mg  300 mg Rectal Daily Sethi, Pramod S, MD       Or   aspirin  chewable tablet 81 mg  81 mg Oral Daily  Sethi, Pramod S, MD   81 mg at 12/31/24 1048   bictegravir-emtricitabine -tenofovir  AF (BIKTARVY ) 50-200-25 MG per tablet 1 tablet  1 tablet Oral [place] Bethanie Cough, PA-C   1 tablet at 12/31/24 0559   Chlorhexidine  Gluconate Cloth 2 % PADS 6 each  6 each Topical Daily Nichola Zeke HERO, MD   6 each at 12/31/24 1049   folic acid  (FOLVITE ) tablet 1 mg  1 mg Oral Daily de La Torre, Cortney E, NP   1 mg at 12/31/24 1048   heparin  injection 5,000 Units  5,000 Units Subcutaneous Q8H Sethi, Pramod S, MD   5,000 Units at 12/31/24 1312   hydrALAZINE  (APRESOLINE ) injection 10-20 mg  10-20 mg Intravenous Q4H PRN Bethanie Cough, PA-C       labetalol  (NORMODYNE ) injection 10-20 mg  10-20 mg Intravenous Q2H PRN Bethanie Cough, PA-C       morphine  (PF) 2 MG/ML injection 2-4 mg  2-4 mg Intravenous Q2H PRN Bethanie Cough, PA-C       multivitamin with minerals tablet 1 tablet  1 tablet Oral Daily de Clint Kill, Cortney E, NP   1 tablet at 12/31/24 1048   Oral care mouth rinse  15 mL Mouth Rinse PRN Nichola Zeke HERO, MD   15 mL at 12/28/24 1600   oxyCODONE -acetaminophen  (PERCOCET/ROXICET) 5-325 MG per tablet 1-2 tablet  1-2 tablet Oral Q4H PRN Bethanie Cough, PA-C   1 tablet at 12/29/24 2131   rosuvastatin  (CRESTOR ) tablet 20 mg  20 mg Oral Daily Eveland, Matthew, PA-C   20 mg at 12/31/24 1048   senna-docusate (Senokot-S) tablet 1 tablet  1 tablet Oral QHS PRN Bethanie Cough, PA-C       sodium chloride  flush (NS) 0.9 % injection 3 mL  3 mL Intravenous Once Eveland, Matthew, PA-C       thiamine  (VITAMIN B1) tablet 100 mg  100 mg Oral Daily de La Torre, Cortney E, NP   100 mg at 12/31/24 1048     Discharge Medications: Please see discharge summary for a list of discharge medications.  Relevant Imaging Results:  Relevant Lab Results:   Additional Information SS#: [id number]  Almarie HERO Goodie, LCSW     "

## 2024-12-31 NOTE — Progress Notes (Addendum)
 STROKE TEAM PROGRESS NOTE    SIGNIFICANT HOSPITAL EVENTS 1/11: Patient admitted with aphasia and right-sided weakness, given TNK and mechanical thrombectomy performed.  INTERIM HISTORY/SUBJECTIVE Patient is seen in his room with no family at the bedside.  He remains hemodynamically stable and afebrile.  He continues to have expressive aphasia but is able to follow one-step commands. Awaiting family decision for CIR. neurological exam is unchanged.  Vital signs are stable.  OBJECTIVE  CBC    Component Value Date/Time   WBC 5.1 12/31/2024 0213   RBC 3.15 (L) 12/31/2024 0213   HGB 10.8 (L) 12/31/2024 0213   HGB 14.4 01/14/2024 1043   HCT 31.2 (L) 12/31/2024 0213   HCT 42.6 01/14/2024 1043   PLT 137 (L) 12/31/2024 0213   PLT 220 01/14/2024 1043   MCV 99.0 12/31/2024 0213   MCV 101 (H) 01/14/2024 1043   MCV 99 01/06/2015 1505   MCH 34.3 (H) 12/31/2024 0213   MCHC 34.6 12/31/2024 0213   RDW 11.9 12/31/2024 0213   RDW 11.9 01/14/2024 1043   RDW 13.0 01/06/2015 1505   LYMPHSABS 0.9 12/27/2024 0705   LYMPHSABS 1.6 01/14/2024 1043   LYMPHSABS 2.6 01/06/2015 1505   MONOABS 0.4 12/27/2024 0705   MONOABS 0.3 01/06/2015 1505   EOSABS 0.0 12/27/2024 0705   EOSABS 0.1 01/14/2024 1043   EOSABS 0.1 01/06/2015 1505   BASOSABS 0.0 12/27/2024 0705   BASOSABS 0.0 01/14/2024 1043   BASOSABS 0.0 01/06/2015 1505    BMET    Component Value Date/Time   NA 138 12/31/2024 0213   NA 141 01/06/2015 1505   K 3.6 12/31/2024 0213   K 3.9 01/06/2015 1505   CL 105 12/31/2024 0213   CL 107 01/06/2015 1505   CO2 25 12/31/2024 0213   CO2 29 01/06/2015 1505   GLUCOSE 99 12/31/2024 0213   GLUCOSE 111 (H) 01/06/2015 1505   BUN 19 12/31/2024 0213   BUN 16 01/06/2015 1505   CREATININE 1.14 12/31/2024 0213   CREATININE 1.28 01/06/2015 1505   CALCIUM  8.4 (L) 12/31/2024 0213   CALCIUM  9.0 01/06/2015 1505   GFRNONAA >60 12/31/2024 0213   GFRNONAA >60 01/06/2015 1505   GFRNONAA >60 05/10/2013 0535     IMAGING past 24 hours No results found.   Vitals:   12/31/24 0400 12/31/24 0600 12/31/24 0813 12/31/24 1148  BP: 131/76 130/76 (!) 135/90 107/71  Pulse:  (!) 29 (!) 51 (!) 44  Resp: 18  16 15   Temp: 97.9 F (36.6 C)  98.8 F (37.1 C) 97.8 F (36.6 C)  TempSrc:   Oral   SpO2: 99% 96% 98% 97%  Weight:         PHYSICAL EXAM General:  Alert, well-nourished, well-developed patient in no acute distress Psych:  Mood and affect appropriate for situation CV: Regular rate and rhythm on monitor Respiratory:  Regular, unlabored respirations on room air   NEURO:  Mental Status: Patient remains globally aphasic but responds to name and intermittently follows simple commands.  He will mimic Speech/Language: No verbal output, some vocalizations but no recognizable words  Cranial Nerves:  II: PERRL.  III, IV, VI: Left gaze deviation, able to go to midline VII: Face is symmetrical resting  VIII: hearing intact to voice. XII: tongue is midline without fasciculations. Motor: Able to move left upper and lower extremities with antigravity strength, able to move right upper extremity but cannot break gravity, moves right lower extremity but also does not break gravity Tone: is normal and  bulk is normal Sensation- Intact to light touch bilaterally.  Coordination: Unable to perform Gait- deferred  Most Recent NIH  1a Level of Conscious.: 0 1b LOC Questions: 2 1c LOC Commands: 1 2 Best Gaze: 1 3 Visual: 0 4 Facial Palsy: 0 5a Motor Arm - left: 0 5b Motor Arm - Right: 3 6a Motor Leg - Left: 0 6b Motor Leg - Right: 3 7 Limb Ataxia: 0 8 Sensory: 0 9 Best Language: 3 10 Dysarthria: 2 11 Extinct. and Inatten.: 0 TOTAL: 15   ASSESSMENT/PLAN  [name] is a 59 y.o. male with history of HIV, hypertension, substance abuse and alcohol abuse admitted for acute onset right-sided weakness and aphasia.  He was given TNK to treat stroke and was found to have occlusion of left  ICA/MCA, and mechanical thrombectomy was successfully performed.  NIH on Admission 30  Acute Ischemic Infarct:  left MCA territory infarct s/p mechanical thrombectomy and TNK Etiology: Large vessel occlusion Code Stroke CT head hyperdense left MCA ASPECTS 7 CTA head & neck left ICA occlusion in the neck, left MCA origin occluded MRI acute left MCA territory infarct with confluent petechial hemorrhage, cytotoxic edema and mild regional mass effect with trace rightward midline shift 2D Echo EF 50 to 55%, moderately dilated left atrium, mildly dilated right atrium, grossly normal atrial septum Will likely need loop recorder at discharge LDL 55 HgbA1c 5.8 VTE prophylaxis -SCDs No antithrombotic prior to admission, now on aspirin  81 mg daily due to petechial hemorrhage Therapy recommendations:  CIR Disposition: Pending  Hypertension Home meds: Losartan  100 mg daily Stable Blood Pressure Goal: SBP 120-160 for first 24 hours then less than 180   Hyperlipidemia Home meds: Rosuvastatin  20 mg daily, resumed in hospital LDL 55, goal < 70 Continue statin at discharge  Tobacco Abuse Patient smokes 0.25 packs per day for 40 years      Ready to quit? N/A Nicotine  replacement therapy provided  Substance Abuse Patient uses cocaine UDS positive for Cocaine      Ready to quit? N/A TOC consult for cessation placed  Dysphagia Patient has post-stroke dysphagia, SLP consulted    Diet   DIET - DYS 1 Room service appropriate? No; Fluid consistency: Thin   Advance diet as tolerated  Other Stroke Risk Factors None   Other Active Problems HIV-continue home Biktarvy   Hospital day # 4  Patient seen and examined by NP/APP with MD. MD to update note as needed.   Jorene Last, DNP, FNP-BC [facility] Pager: [phone]  Patient medically stable to be transferred to rehab when bed available and after family decision. Eather Popp, MD To contact Stroke Continuity provider,  please refer to [email or web address]. After hours, contact General Neurology

## 2025-01-01 DIAGNOSIS — B20 Human immunodeficiency virus [HIV] disease: Secondary | ICD-10-CM | POA: Diagnosis not present

## 2025-01-01 DIAGNOSIS — I779 Disorder of arteries and arterioles, unspecified: Secondary | ICD-10-CM | POA: Diagnosis not present

## 2025-01-01 DIAGNOSIS — I63512 Cerebral infarction due to unspecified occlusion or stenosis of left middle cerebral artery: Secondary | ICD-10-CM | POA: Diagnosis not present

## 2025-01-01 DIAGNOSIS — F1410 Cocaine abuse, uncomplicated: Secondary | ICD-10-CM | POA: Diagnosis not present

## 2025-01-01 DIAGNOSIS — F17210 Nicotine dependence, cigarettes, uncomplicated: Secondary | ICD-10-CM | POA: Diagnosis not present

## 2025-01-01 DIAGNOSIS — G936 Cerebral edema: Secondary | ICD-10-CM | POA: Diagnosis not present

## 2025-01-01 DIAGNOSIS — E785 Hyperlipidemia, unspecified: Secondary | ICD-10-CM | POA: Diagnosis not present

## 2025-01-01 DIAGNOSIS — I69391 Dysphagia following cerebral infarction: Secondary | ICD-10-CM | POA: Diagnosis not present

## 2025-01-01 DIAGNOSIS — I10 Essential (primary) hypertension: Secondary | ICD-10-CM | POA: Diagnosis not present

## 2025-01-01 DIAGNOSIS — R233 Spontaneous ecchymoses: Secondary | ICD-10-CM | POA: Diagnosis not present

## 2025-01-01 DIAGNOSIS — R29715 NIHSS score 15: Secondary | ICD-10-CM | POA: Diagnosis not present

## 2025-01-01 NOTE — Progress Notes (Signed)
" °  Progress Note    01/01/2025 11:17 AM 5 Days Post-Op  Subjective:  awake, no complaints     Vitals:   01/01/25 0500 01/01/25 0914  BP: 122/77 127/71  Pulse: (!) 42 (!) 50  Resp: 16   Temp: 98.9 F (37.2 C) 98.4 F (36.9 C)  SpO2: 96% 99%    Physical Exam: General:  no distress Cardiac:  regular Lungs:  non labored Incisions:  right groin with prevena with good seal Extremities:  easily palpable right PT pulse and right calf and anterior compartments are soft and non tender   CBC    Component Value Date/Time   WBC 5.1 12/31/2024 0213   RBC 3.15 (L) 12/31/2024 0213   HGB 10.8 (L) 12/31/2024 0213   HGB 14.4 01/14/2024 1043   HCT 31.2 (L) 12/31/2024 0213   HCT 42.6 01/14/2024 1043   PLT 137 (L) 12/31/2024 0213   PLT 220 01/14/2024 1043   MCV 99.0 12/31/2024 0213   MCV 101 (H) 01/14/2024 1043   MCV 99 01/06/2015 1505   MCH 34.3 (H) 12/31/2024 0213   MCHC 34.6 12/31/2024 0213   RDW 11.9 12/31/2024 0213   RDW 11.9 01/14/2024 1043   RDW 13.0 01/06/2015 1505   LYMPHSABS 0.9 12/27/2024 0705   LYMPHSABS 1.6 01/14/2024 1043   LYMPHSABS 2.6 01/06/2015 1505   MONOABS 0.4 12/27/2024 0705   MONOABS 0.3 01/06/2015 1505   EOSABS 0.0 12/27/2024 0705   EOSABS 0.1 01/14/2024 1043   EOSABS 0.1 01/06/2015 1505   BASOSABS 0.0 12/27/2024 0705   BASOSABS 0.0 01/14/2024 1043   BASOSABS 0.0 01/06/2015 1505    BMET    Component Value Date/Time   NA 138 12/31/2024 0213   NA 141 01/06/2015 1505   K 3.6 12/31/2024 0213   K 3.9 01/06/2015 1505   CL 105 12/31/2024 0213   CL 107 01/06/2015 1505   CO2 25 12/31/2024 0213   CO2 29 01/06/2015 1505   GLUCOSE 99 12/31/2024 0213   GLUCOSE 111 (H) 01/06/2015 1505   BUN 19 12/31/2024 0213   BUN 16 01/06/2015 1505   CREATININE 1.14 12/31/2024 0213   CREATININE 1.28 01/06/2015 1505   CALCIUM  8.4 (L) 12/31/2024 0213   CALCIUM  9.0 01/06/2015 1505   GFRNONAA >60 12/31/2024 0213   GFRNONAA >60 01/06/2015 1505   GFRNONAA >60  05/10/2013 0535   GFRAA 54 (L) 07/12/2020 1511   GFRAA >60 01/06/2015 1505   GFRAA >60 05/10/2013 0535    INR    Component Value Date/Time   INR 1.0 12/27/2024 0705     Intake/Output Summary (Last 24 hours) at 01/01/2025 1117 Last data filed at 01/01/2025 0740 Gross per 24 hour  Intake 180 ml  Output 2000 ml  Net -1820 ml      Assessment/Plan:  59 y.o. male is s/p:  Right iliofemoral embolectomy, right iliofemoral endarterectomy with saphenous vein angioplasty on 12/27/2024 by Dr. Lanis  5 Days Post-Op  Prevena removed.  Incision healing nicely.  Palpable pulse in the foot.  Will see follow up.     "

## 2025-01-01 NOTE — Progress Notes (Signed)
 Inpatient Rehab Admissions Coordinator:  Attempted to contact pt's son Darold to verify dispo. No one answered and not able to leave a message. Will continue to follow.  Tinnie Yvone Cohens, MS, CCC-SLP Admissions Coordinator [phone]

## 2025-01-01 NOTE — Discharge Summary (Shared)
 Stroke Discharge Summary  Patient ID: Roy [name]   MRN: [id number]      DOB: 25-Aug-1966  Date of Admission: 12/27/2024 Date of Discharge: 01/01/2025  Attending Physician:  Stroke, Md, MD Consultant(s):    vascular surgery  [name] PCP:  [facility] [place]  [facility] PRIMARY DIAGNOSIS:  Acute Ischemic Infarct:  left MCA territory infarct s/p mechanical thrombectomy and TNK Etiology: Large vessel occlusion  Patient Active Problem List   Diagnosis Date Noted   Stroke (HCC) 12/27/2024   Stroke (cerebrum) (HCC) 12/27/2024   Polysubstance dependence (HCC) 06/03/2024   Generalized abdominal pain 05/04/2024   Gastroenteritis 05/04/2024   Impaired ambulation 05/03/2024   AKI (acute kidney injury) 05/03/2024   Suicidal thoughts 06/16/2023   Cocaine abuse with cocaine-induced mood disorder (HCC) 11/13/2020   Allergic rhinitis 06/21/2020   GERD (gastroesophageal reflux disease) 06/21/2020   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   Arthritis pain, hip 05/18/2020   Status post total hip replacement, 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   Angioedema 07/24/2019   Chest pain 05/27/2018   ARF (acute renal failure) 04/08/2018   Malingering 03/07/2017   Cocaine dependence (HCC) 02/19/2017   Substance induced mood disorder (HCC) 02/19/2017   Alcohol use disorder, severe, dependence (HCC) 12/27/2016   HIV disease (HCC) 10/26/2016   HTN (hypertension) 10/26/2016   Dyslipidemia 10/26/2016   BPH (benign prostatic hyperplasia) 10/26/2016   Constipation 10/26/2016   Acquired hallux rigidus of right foot 07/16/2016   Arthritis 10/25/2014   Genital warts 08/25/2013     Allergies as of 01/01/2025       Reactions   Amlodipine  Swelling   Of the tongue   Lisinopril  Swelling   Bee Pollen Itching, Other (See Comments)   Itchy eyes and runny nose   Lactose Other (See Comments)   GI distress   Pollen Extract Other (See Comments)   Itchy eyes and runny nose   Statins Other (See  Comments)   Body aches   Yellow Jacket Venom Itching     Med Rec must be completed prior to using this SMARTLINK***       LABORATORY STUDIES CBC    Component Value Date/Time   WBC 5.1 12/31/2024 0213   RBC 3.15 (L) 12/31/2024 0213   HGB 10.8 (L) 12/31/2024 0213   HGB 14.4 01/14/2024 1043   HCT 31.2 (L) 12/31/2024 0213   HCT 42.6 01/14/2024 1043   PLT 137 (L) 12/31/2024 0213   PLT 220 01/14/2024 1043   MCV 99.0 12/31/2024 0213   MCV 101 (H) 01/14/2024 1043   MCV 99 01/06/2015 1505   MCH 34.3 (H) 12/31/2024 0213   MCHC 34.6 12/31/2024 0213   RDW 11.9 12/31/2024 0213   RDW 11.9 01/14/2024 1043   RDW 13.0 01/06/2015 1505   LYMPHSABS 0.9 12/27/2024 0705   LYMPHSABS 1.6 01/14/2024 1043   LYMPHSABS 2.6 01/06/2015 1505   MONOABS 0.4 12/27/2024 0705   MONOABS 0.3 01/06/2015 1505   EOSABS 0.0 12/27/2024 0705   EOSABS 0.1 01/14/2024 1043   EOSABS 0.1 01/06/2015 1505   BASOSABS 0.0 12/27/2024 0705   BASOSABS 0.0 01/14/2024 1043   BASOSABS 0.0 01/06/2015 1505   CMP    Component Value Date/Time   NA 138 12/31/2024 0213   NA 141 01/06/2015 1505   K 3.6 12/31/2024 0213   K 3.9 01/06/2015 1505   CL 105 12/31/2024 0213   CL 107 01/06/2015 1505   CO2 25 12/31/2024 0213   CO2 29  01/06/2015 1505   GLUCOSE 99 12/31/2024 0213   GLUCOSE 111 (H) 01/06/2015 1505   BUN 19 12/31/2024 0213   BUN 16 01/06/2015 1505   CREATININE 1.14 12/31/2024 0213   CREATININE 1.28 01/06/2015 1505   CALCIUM  8.4 (L) 12/31/2024 0213   CALCIUM  9.0 01/06/2015 1505   PROT 6.5 12/27/2024 0705   PROT 7.5 01/06/2015 1505   ALBUMIN  3.7 12/27/2024 0705   ALBUMIN  3.7 01/06/2015 1505   AST 19 12/27/2024 0705   AST 33 01/06/2015 1505   ALT 14 12/27/2024 0705   ALT 63 01/06/2015 1505   ALKPHOS 77 12/27/2024 0705   ALKPHOS 84 01/06/2015 1505   BILITOT 0.2 12/27/2024 0705   BILITOT 0.3 01/06/2015 1505   GFRNONAA >60 12/31/2024 0213   GFRNONAA >60 01/06/2015 1505   GFRNONAA >60 05/10/2013 0535   GFRAA  54 (L) 07/12/2020 1511   GFRAA >60 01/06/2015 1505   GFRAA >60 05/10/2013 0535   COAGS Lab Results  Component Value Date   INR 1.0 12/27/2024   INR 1.0 05/25/2020   INR 1.0 08/27/2019   Lipid Panel    Component Value Date/Time   CHOL 117 12/28/2024 0510   TRIG 85 12/28/2024 0510   HDL 46 12/28/2024 0510   CHOLHDL 2.6 12/28/2024 0510   VLDL 17 12/28/2024 0510   LDLCALC 55 12/28/2024 0510   HgbA1C  Lab Results  Component Value Date   HGBA1C 5.8 (H) 12/27/2024   Alcohol Level    Component Value Date/Time   [facility] <15 12/27/2024 0705     SIGNIFICANT DIAGNOSTIC STUDIES DG Swallowing Func-Speech Pathology Result Date: 12/28/2024 Table formatting from the original result was not included. Modified Barium Swallow Study Patient Details Name: Roy [name] MRN: [id number] Date of Birth: 09-01-1966 Today's Date: 12/28/2024 HPI/PMH: HPI: 59 yo male presenting to ED 1/11 from [facility] rehab center with R sided weakness and AMS s/p fall. CTA shows proximal L MCA and ICA occlusion s/p TNK and thrombectomy. Complicated by R CFA occlusion from closure device requiring exploration and endarterectomy with angioplasty. MRI shows acute L MCA territory infarct, confluent at the L basal ganglia, insula, and operculum as well as ongoing abnormal L ICA flow below the skull base. PMH includes HIV, history of hepatitis C, bipolar disorder, cocaine and EtOH abuse Clinical Impression: Pt exhibits primary oral dysphagia with mild pharyngeal deficits. He is edentulous and it is unclear if he wears dentures at baseline. This, in addition to R CN V, VII, and XII deficits are suspected to contribute to prolonged mastication and moderate anterior loss. There is a collection of oral and pharyngeal residue across all consistencies that consistently clears with subswallows. Anterior hyoid movement and epiglottic inversion are partial but complete laryngeal elevation closes the laryngeal vestibule completely at the height  of the swallow. Trace penetration occurs with both thin and nectar thick liquids but stays above the vocal folds and is eventually expelled as the study progresses (PAS 2). Even when having difficulty with orally managing the 13 mm barium tablet with thin liquids, the bolus did not progress lower in the laryngeal vestibule. To clear the tablet from his oral cavity, subsequent bites of purees were given but it then became transiently lodged in the pyriform sinuses. Once the tablet cleared from the oropharynx, no esophageal retention was noted. Recommend Dys 1 diet with thin liquids. Meds can be given whole with puree (crush if larger). SLP will f/u. Factors that may increase risk of adverse event in presence of aspiration (  Palmer & Lianne 2021): Factors that may increase risk of adverse event in presence of aspiration Noe & Lianne 2021): Reduced cognitive function Recommendations/Plan: Swallowing Evaluation Recommendations Swallowing Evaluation Recommendations Recommendations: PO diet PO Diet Recommendation: Dysphagia 1 (Pureed); Thin liquids (Level 0) Liquid Administration via: Cup; Straw Medication Administration: Whole meds with puree Supervision: Full supervision/cueing for swallowing strategies; Staff to assist with self-feeding Swallowing strategies  : Check for pocketing or oral holding; Check for anterior loss Postural changes: Position pt fully upright for meals Oral care recommendations: Oral care BID (2x/day) Treatment Plan Treatment Plan Treatment recommendations: Therapy as outlined in treatment plan below Follow-up recommendations: Acute inpatient rehab (3 hours/day) Functional status assessment: Patient has had a recent decline in their functional status and demonstrates the ability to make significant improvements in function in a reasonable and predictable amount of time. Treatment frequency: Min 2x/week Treatment duration: 2 weeks Interventions: Oropharyngeal exercises; Compensatory  techniques; Trials of upgraded texture/liquids Recommendations Recommendations for follow up therapy are one component of a multi-disciplinary discharge planning process, led by the attending physician.  Recommendations may be updated based on patient status, additional functional criteria and insurance authorization. Assessment: Orofacial Exam: Orofacial Exam Oral Cavity: Oral Hygiene: WFL Oral Cavity - Dentition: Edentulous Orofacial Anatomy: WFL Oral Motor/Sensory Function: Suspected cranial nerve impairment CN V - Trigeminal: Right sensory impairment CN VII - Facial: Right motor impairment CN IX - Glossopharyngeal, CN X - Vagus: WFL CN XII - Hypoglossal: Right motor impairment Anatomy: Anatomy: Suspected cervical osteophytes Boluses Administered: Boluses Administered Boluses Administered: Thin liquids (Level 0); Mildly thick liquids (Level 2, nectar thick); Moderately thick liquids (Level 3, honey thick); Puree; Solid  Oral Impairment Domain: Oral Impairment Domain Lip Closure: Escape beyond mid-chin Tongue control during bolus hold: Not tested Bolus preparation/mastication: Slow prolonged chewing/mashing with complete recollection Bolus transport/lingual motion: Delayed initiation of tongue motion (oral holding) Oral residue: Residue collection on oral structures Location of oral residue : Floor of mouth; Tongue; Lateral sulci Initiation of pharyngeal swallow : Pyriform sinuses  Pharyngeal Impairment Domain: Pharyngeal Impairment Domain Soft palate elevation: No bolus between soft palate (SP)/pharyngeal wall (PW) Laryngeal elevation: Complete superior movement of thyroid  cartilage with complete approximation of arytenoids to epiglottic petiole Anterior hyoid excursion: Partial anterior movement Epiglottic movement: No inversion Laryngeal vestibule closure: Complete, no air/contrast in laryngeal vestibule Pharyngeal stripping wave : Present - complete Pharyngeal contraction (A/P view only): N/A  Pharyngoesophageal segment opening: Complete distension and complete duration, no obstruction of flow Tongue base retraction: No contrast between tongue base and posterior pharyngeal wall (PPW) Pharyngeal residue: Collection of residue within or on pharyngeal structures Location of pharyngeal residue: Valleculae; Pyriform sinuses  Esophageal Impairment Domain: Esophageal Impairment Domain Esophageal clearance upright position: Complete clearance, esophageal coating Pill: Pill Consistency administered: Thin liquids (Level 0); Puree Thin liquids (Level 0): Impaired (see clinical impressions) Puree: WFL Penetration/Aspiration Scale Score: Penetration/Aspiration Scale Score 1.  Material does not enter airway: Moderately thick liquids (Level 3, honey thick); Puree; Solid; Pill 2.  Material enters airway, remains ABOVE vocal cords then ejected out: Thin liquids (Level 0); Mildly thick liquids (Level 2, nectar thick) Compensatory Strategies: Compensatory Strategies Compensatory strategies: No   General Information: Caregiver present: Yes BANKER)  Diet Prior to this Study: NPO   Temperature : Normal   Respiratory Status: WFL   Supplemental O2: None (Room air)   History of Recent Intubation: No  Behavior/Cognition: Alert; Cooperative; Requires cueing Self-Feeding Abilities: Needs set-up for self-feeding Baseline vocal quality/speech: Normal Volitional Cough: Able  to elicit Volitional Swallow: Able to elicit Exam Limitations: No limitations Goal Planning: Prognosis for improved oropharyngeal function: Good Barriers to Reach Goals: Language deficits No data recorded Patient/Family Stated Goal: difficulty stating Consulted and agree with results and recommendations: Patient; Nurse Pain: Pain Assessment Pain Assessment: Faces Faces Pain Scale: 0 Facial Expression: 0 Body Movements: 0 Muscle Tension: 0 Compliance with ventilator (intubated pts.): N/A Vocalization (extubated pts.): 0 CPOT Total: 0 Pain Intervention(s): Monitored  during session End of Session: Start Time:SLP Start Time (ACUTE ONLY): 1523 Stop Time: SLP Stop Time (ACUTE ONLY): 1546 Time Calculation:SLP Time Calculation (min) (ACUTE ONLY): 23 min Charges: SLP Evaluations $ SLP Speech Visit: 1 Visit SLP Evaluations $BSS Swallow: 1 Procedure $MBS Swallow: 1 Procedure $ SLP EVAL LANGUAGE/SOUND PRODUCTION: 1 Procedure SLP visit diagnosis: SLP Visit Diagnosis: Dysphagia, oropharyngeal phase (R13.12) Past Medical History: Past Medical History: Diagnosis Date  AIDS (acquired immune deficiency syndrome) (HCC)   Angioedema   ARF (acute renal failure)   Arthritis   Asthma   Bipolar disorder (HCC)   BPH (benign prostatic hyperplasia)   Bradycardia   Bronchitis   Cocaine abuse (HCC)   Coronary artery disease   Depression   Dyslipidemia   Dysrhythmia   1st degree heart block/ brady  Genital warts   GERD (gastroesophageal reflux disease)   Hepatitis C   treated  Hernia of abdominal wall   HIV (human immunodeficiency virus infection) (HCC)   HTN (hypertension)   Myocardial infarction (HCC) 2020  pt states stress test showed mild heart attack  Pre-diabetes   Septic hip (HCC)   SVT (supraventricular tachycardia)  Past Surgical History: Past Surgical History: Procedure Laterality Date  APPLICATION OF WOUND VAC Right 09/01/2019  Procedure: APPLICATION OF WOUND VAC;  Surgeon: Kathlynn Sharper, MD;  Location: [facility] ORS;  Service: Orthopedics;  Laterality: Right;  Serial # [id number]  APPLICATION OF WOUND VAC Right 12/27/2024  Procedure: RIGHT GROIN PREVENA APPLICATION, WOUND VAC;  Surgeon: Lanis Fonda BRAVO, MD;  Location: [facility] OR;  Service: Vascular;  Laterality: Right;  ENDARTERECTOMY FEMORAL Right 12/27/2024  Procedure: RIGHT FEMORAL ENDARTERECTOMY,L;  Surgeon: Lanis Fonda BRAVO, MD;  Location: [facility] OR;  Service: Vascular;  Laterality: Right;  HERNIA REPAIR Left   inguinal  PATCH ANGIOPLASTY Right 12/27/2024  Procedure: PATCH ANGIOPLASTY, USING PATCH GREATER SAPHENOUS VEIN;  Surgeon: Lanis Fonda BRAVO, MD;   Location: [facility] OR;  Service: Vascular;  Laterality: Right;  RADIOLOGY WITH ANESTHESIA N/A 12/27/2024  Procedure: RADIOLOGY WITH ANESTHESIA;  Surgeon: Radiologist, Medication, MD;  Location: [facility] OR;  Service: Radiology;  Laterality: N/A;  THROMBECTOMY FEMORAL ARTERY Right 12/27/2024  Procedure: RIGHT FEMORAL ARTERY THROMBECTOMY;  Surgeon: Lanis Fonda BRAVO, MD;  Location: [facility] OR;  Service: Vascular;  Laterality: Right;  TOE SURGERY Right   TOTAL HIP ARTHROPLASTY Right 09/01/2019  Procedure: TOTAL HIP ARTHROPLASTY ANTERIOR APPROACH;  Surgeon: Kathlynn Sharper, MD;  Location: [facility] ORS;  Service: Orthopedics;  Laterality: Right;  VEIN HARVEST Right 12/27/2024  Procedure: RIGHT GREATER SAPHENOUS VEIN HARVEST, SURGICAL PROCUREMENT, VEIN;  Surgeon: Lanis Fonda BRAVO, MD;  Location: [facility] OR;  Service: Vascular;  Laterality: Right; Damien Blumenthal, M.A., CCC-SLP Speech Language Pathology, Acute Rehabilitation Services Secure Chat preferred [phone] 12/28/2024, 4:17 PM  ECHOCARDIOGRAM COMPLETE Result Date: 12/28/2024    ECHOCARDIOGRAM REPORT   Patient Name:   STALIN GRUENBERG [facility] SR. Date of Exam: 12/28/2024 Medical Rec #:  [id number]              Height:       69.0 in  Accession #:    [id number]             Weight:       179.0 lb Date of Birth:  [birth year]             BSA:          1.971 m Patient Age:    58 years               BP:           138/86 mmHg Patient Gender: M                      HR:           55 bpm. Exam Location:  Inpatient Procedure: 2D Echo, Cardiac Doppler and Color Doppler (Both Spectral and Color            Flow Doppler were utilized during procedure). Indications:    Stroke I63.9  History:        Patient has prior history of Echocardiogram examinations, most                 recent 01/23/2019. CAD and Previous Myocardial Infarction; Risk                 Factors:Hypertension.  Sonographer:    Jayson Gaskins Referring Phys: [id number] MATTHEW EVELAND IMPRESSIONS  1. Left ventricular ejection fraction, by estimation, is 50 to  55%. The left ventricle has low normal function. The left ventricle has no regional wall motion abnormalities. Left ventricular diastolic parameters were normal.  2. Right ventricular systolic function is normal. The right ventricular size is normal.  3. Left atrial size was moderately dilated.  4. Right atrial size was mildly dilated.  5. The mitral valve is grossly normal. Trivial mitral valve regurgitation. No evidence of mitral stenosis.  6. The aortic valve is grossly normal. There is mild calcification of the aortic valve. Aortic valve regurgitation is trivial.  7. The inferior vena cava is normal in size with greater than 50% respiratory variability, suggesting right atrial pressure of 3 mmHg. Comparison(s): Prior images reviewed side by side. Conclusion(s)/Recommendation(s): Otherwise normal echocardiogram, with minor abnormalities described in the report. No intracardiac source of embolism detected on this transthoracic study. Consider a transesophageal echocardiogram to exclude cardiac source of embolism if clinically indicated. FINDINGS  Left Ventricle: Left ventricular ejection fraction, by estimation, is 50 to 55%. The left ventricle has low normal function. The left ventricle has no regional wall motion abnormalities. The left ventricular internal cavity size was normal in size. There is no left ventricular hypertrophy. Left ventricular diastolic parameters were normal. Right Ventricle: The right ventricular size is normal. Right vetricular wall thickness was not well visualized. Right ventricular systolic function is normal. Left Atrium: Left atrial size was moderately dilated. Right Atrium: Right atrial size was mildly dilated. Pericardium: There is no evidence of pericardial effusion. Mitral Valve: The mitral valve is grossly normal. Trivial mitral valve regurgitation. No evidence of mitral valve stenosis. Tricuspid Valve: The tricuspid valve is grossly normal. Tricuspid valve regurgitation is  trivial. No evidence of tricuspid stenosis. Aortic Valve: The aortic valve is grossly normal. There is mild calcification of the aortic valve. Aortic valve regurgitation is trivial. Aortic valve mean gradient measures 6.0 mmHg. Aortic valve peak gradient measures 10.0 mmHg. Aortic valve area, by VTI measures 2.93 cm. Pulmonic Valve: The pulmonic valve was not well visualized. Pulmonic valve regurgitation is not visualized. No evidence of  pulmonic stenosis. Aorta: The aortic root is normal in size and structure and the ascending aorta was not well visualized. Venous: The inferior vena cava is normal in size with greater than 50% respiratory variability, suggesting right atrial pressure of 3 mmHg. IAS/Shunts: The atrial septum is grossly normal.  LEFT VENTRICLE PLAX 2D LVIDd:         5.50 cm   Diastology LVIDs:         4.10 cm   LV e' medial:    8.05 cm/s LV PW:         0.90 cm   LV E/e' medial:  12.2 LV IVS:        1.00 cm   LV e' lateral:   9.79 cm/s LVOT diam:     2.05 cm   LV E/e' lateral: 10.0 LV SV:         87 LV SV Index:   44 LVOT Area:     3.30 cm  RIGHT VENTRICLE RV S prime:     15.90 cm/s TAPSE (M-mode): 3.6 cm LEFT ATRIUM             Index        RIGHT ATRIUM           Index LA Vol (A2C):   80.8 ml 41.00 ml/m  RA Area:     20.30 cm LA Vol (A4C):   82.8 ml 42.01 ml/m  RA Volume:   58.00 ml  29.43 ml/m LA Biplane Vol: 88.8 ml 45.06 ml/m  AORTIC VALVE AV Area (Vmax):    3.03 cm AV Area (Vmean):   3.04 cm AV Area (VTI):     2.93 cm AV Vmax:           158.00 cm/s AV Vmean:          114.000 cm/s AV VTI:            0.299 m AV Peak Grad:      10.0 mmHg AV Mean Grad:      6.0 mmHg LVOT Vmax:         145.00 cm/s LVOT Vmean:        105.000 cm/s LVOT VTI:          0.265 m LVOT/AV VTI ratio: 0.89  AORTA Ao Root diam: 3.60 cm MITRAL VALVE MV Area (PHT): 2.48 cm     SHUNTS MV Decel Time: 306 msec     Systemic VTI:  0.26 m MV E velocity: 98.00 cm/s   Systemic Diam: 2.05 cm MV A velocity: 106.00 cm/s MV E/A  ratio:  0.92 Shelda Bruckner MD Electronically signed by Shelda Bruckner MD Signature Date/Time: 12/28/2024/1:29:22 PM    Final    MR BRAIN WO CONTRAST Result Date: 12/28/2024 EXAM: MRI BRAIN WITHOUT CONTRAST 12/28/2024 05:36:51 AM TECHNIQUE: Multiplanar multisequence MRI of the head/brain was performed without the administration of intravenous contrast. COMPARISON: CT head and CTA head and neck yesterday. CLINICAL HISTORY: 58 year old male. Code stroke presentation yesterday with left ICA occlusion in the neck, left MCA occlusion. Status post endovascular thrombectomy. FINDINGS: BRAIN AND VENTRICLES: Widespread heterogeneous diffusion restriction in the left MCA territory from the left basal ganglia through to the insula and operculum (series 5 image 77). Scattered additional cortical encephalomalacia extending subclad in both the anterior and superior division territory (series 5 image 87). Confluent petechial hemorrhage in the left basal ganglia. No significant associated mass effect from the hemorrhage itself. T2 and FLAIR hyperintense cytotoxic edema which does result in  mild mass effect on the left lateral ventricle. Trace rightward midline shift. No ventriculomegaly. Basilar cisterns are patent. No right hemisphere or posterior fossa diffusion restriction. Background gray and white matter signal appears normal for age. Abnormal left ICA flow void below the skull base. Left ICA siphon flow void appears more symmetric. Left MCA flow void is visible. Other major vascular flow voids are maintained. The sella is unremarkable. ORBITS: No significant abnormality. SINUSES AND MASTOIDS: Mild paranasal sinus mucosal thickening and fluid. Mastoids are clear. BONES AND SOFT TISSUES: Normal marrow signal. No significant soft tissue abnormality. HEIDELBERG BLEEDING CLASSIFICATION: 1b; HI2 if confluent petechiae, no mass effect IMPRESSION: 1. Acute left MCA territory infarct, confluent at the left basal  ganglia, insula and operculum. 1b; HI2 confluent petechial hemorrhage. Cytotoxic edema mild regional mass effect and trace rightward midline shift. 2. Cervical left ICA flow void below the skull base remains abnormal. Electronically signed by: Helayne Hurst MD MD 12/28/2024 06:14 AM EST RP Workstation: [id number]   DG OR LOCAL ABDOMEN Result Date: 12/27/2024 CLINICAL DATA:  Missing surgical sponge during hip surgery. Evaluate for foreign body. EXAM: OR LOCAL ABDOMEN COMPARISON:  None Available. FINDINGS: Mild diffuse generalized gaseous distension of stomach, small, and large bowel is seen, consistent with mild ileus. Foley catheter seen in place with small amount of contrast in the urinary bladder. Right hip prosthesis is seen. Overlying skin staples and surgical drain noted. No retained surgical sponge or other radiopaque instrument seen. Severe left hip osteoarthritis noted. IMPRESSION: Right hip prosthesis in appropriate position. Overlying surgical drains and skin staples noted. No surgical sponge or other radiopaque instrument identified. Mild ileus pattern. Electronically Signed   By: Norleen DELENA Kil M.D.   On: 12/27/2024 14:49   CT ANGIO HEAD NECK W WO CM (CODE STROKE) Result Date: 12/27/2024 EXAM: CTA Head and Neck with Intravenous Contrast. CT Head without Contrast. CLINICAL HISTORY: 59 year old male with acute neurological deficit, ELVO L MCA suspected. TECHNIQUE: Axial CTA images of the head and neck performed with intravenous contrast. MIP reconstructed images were created and reviewed. Axial computed tomography images of the head/brain performed without intravenous contrast. Note: Per PQRS, the description of internal carotid artery percent stenosis, including 0 percent or normal exam, is based on [employer] American Symptomatic Carotid Endarterectomy Trial (NASCET) criteria. Dose reduction technique was used including one or more of the following: automated exposure control, adjustment of mA and kV  according to patient size, and/or iterative reconstruction. CONTRAST: 75 ml omnipaque  350; COMPARISON: Plain head CT 12/27/2024. FINDINGS: CT HEAD: BRAIN: Plain CT evidence of left MCA emergent large vessel occlusion, early infarct changes. No acute intraparenchymal hemorrhage. No mass lesion. No midline shift or extra-axial collection. VENTRICLES: No hydrocephalus. ORBITS: The orbits are unremarkable. SINUSES AND MASTOIDS: The paranasal sinuses and mastoid air cells are clear. CTA NECK: Thoracic Aorta: Mildly tortuous thoracic aortic arch and proximal descending aorta. Minimal arch atherosclerosis. 3 vessel arch configuration. Subclavian Arteries: Mild proximal left subclavian artery atherosclerosis without stenosis. COMMON CAROTID ARTERIES: No left CCA atherosclerosis. Minimal right carotid bifurcation atherosclerosis. No significant stenosis. No dissection or occlusion. INTERNAL CAROTID ARTERIES: Soft and calcified plaque at the left ICA origin and bulb, and distal to the bulb the left ICA is gradually occluded in the neck with a long tapering appearance (series 17 image 29). There is crescent shaped faint enhancement reconstitution just below the skull base. Left ICA siphon is functionally occluded. Left ICA is occluded at the skull base with faint left ICA reconstitution. Improved left  ICA terminus reconstitution from left posterior communicating artery collateral. Minimal right ICA siphon calcified plaque. No right carotid stenosis. No dissection or occlusion. VERTEBRAL ARTERIES: Normal right vertebral artery origin. Right vertebral artery V2 segment stenosis appears related to extrinsic mass effect from bulky cervical spine degeneration (series 7 image 220), moderate to severe stenosis results. No other right vertebral artery stenosis to the vertebrobasilar junction. Normal left vertebral artery origin. Fairly codominant left vertebral artery with mild tortuosity, no stenosis. Normal left PICA origin. No  dissection or occlusion. CTA HEAD: ANTERIOR CEREBRAL ARTERIES: Left ACA origin remains patent. Left ACA A1 segment is diminutive. Normal anterior communicating artery. Symmetric and normal ACA branch enhancement. Normal right ACA origin. No significant stenosis. No occlusion. No aneurysm. MIDDLE CEREBRAL ARTERIES: Left MCA origin is occluded. Minimal left MCA branch reconstitution. Normal right MCA origin. No significant stenosis. No occlusion. No aneurysm. POSTERIOR CEREBRAL ARTERIES: Normal PCA origins. Right posterior communicating artery is diminutive or absent. Mild left PCA irregularity and stenosis at the P2/P3 segment junction (series 17 image 24). No significant stenosis. No occlusion. No aneurysm. BASILAR ARTERY: No significant stenosis. No occlusion. No aneurysm. Intracranial Venous: Early intracranial venous contrast timing but the major dural venous sinuses appear to be patent. SOFT TISSUES: No masses or lymphadenopathy. BONES: Dentition is absent. Ordinary cervical spine degeneration. Right vertebral artery V2 segment stenosis appears related to extrinsic mass effect from bulky cervical spine degeneration (series 7 image 220), moderate to severe stenosis results. No acute osseous abnormality. LUNGS: Centrilobular emphysema and mild scarring in the visible upper lungs. IMPRESSION: 1. 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 Terminus reconstitute from left Pcomm. Left MCA origin occluded. 2. Preliminary of above communicated to Dr. Nichola at 0719 hours on 12/27/2024 by text page via the [facility] messaging system. 3. No other carotid stenosis. Moderate to severe right vertebral V2 stenosis; extrinsic mass effect from bulky cervical spine degeneration. Mild distal left PCA stenosis (P2/P3 junction). 4. Emphysema. Electronically signed by: Helayne Hurst MD MD 12/27/2024 07:30 AM EST RP Workstation: [id number]   CT HEAD CODE STROKE WO  CONTRAST Result Date: 12/27/2024 EXAM: CT HEAD WITHOUT CONTRAST 12/27/2024 07:05:00 AM TECHNIQUE: CT of the head was performed without the administration of intravenous contrast. Automated exposure control, iterative reconstruction, and/or weight based adjustment of the mA/kV was utilized to reduce the radiation dose to as low as reasonably achievable. COMPARISON: 05/08/2013 CLINICAL HISTORY: 59 year old male. Acute neurological deficit, stroke suspected. FINDINGS: BRAIN AND VENTRICLES: No acute hemorrhage. Loss of gray-white differentiation in keeping with early cytotoxic edema at the left insula, left inferior frontal gyrus. Subtle involvement of the left operculum also. Right hemisphere and posterior fossa gray-white differentiation maintained. No mass effect or midline shift. No hydrocephalus. No extra-axial collection. Mild calcified atherosclerosis at the skull base. Hyperdense left MCA strongly suggesting emergent large vessel occlusion. ORBITS: Leftward gaze deviation. SINUSES: Scattered mild paranasal sinus inflammation including some bubbly opacity. Tympanic cavities and mastoids are well aerated. SOFT TISSUES AND SKULL: No acute soft tissue abnormality. No skull fracture. ALBERTA STROKE PROGRAM EARLY CT SCORE (ASPECTS): Ganglionic (caudate, IC, lentiform nucleus, insula, M1-M3): 4 Supraganglionic (M4-M6): 3 Total: 7 IMPRESSION: 1. Hyperdense left MCA and ASPECTS 7, consistent with ELVO. No intracranial hemorrhage or mass effect. 2. These results were communicated to Dr. Nichola at [phone] hours on 12/27/2024 by text page via the [facility] messaging system. Electronically signed by: Helayne Hurst MD MD 12/27/2024 07:17 AM EST RP Workstation:  [id number]   DG Chest 2 View Result Date: 12/11/2024 CLINICAL DATA:  Cough, fever EXAM: DG CHEST 2V COMPARISON:  May 03, 2024 FINDINGS: The heart size and mediastinal contours are within normal limits. Both lungs are clear. The visualized skeletal structures are  unremarkable. IMPRESSION: No active cardiopulmonary disease. Electronically Signed   By: Lynwood Landy Raddle M.D.   On: 12/11/2024 12:32       HISTORY OF PRESENT ILLNESS 59 y.o. patient with history of HIV infection, hypertension, substance abuse and alcohol abuse was admitted with acute onset right-sided weakness and aphasia  HOSPITAL COURSE Patient was given TNK to treat stroke and on CT angiogram was found to have occlusion of left ICA and MCA.  Mechanical thrombectomy was successfully performed.  Angio-Seal device was used, which caused flow-limiting lesion in right common femoral artery.  Vascular surgery was consulted and embolectomy was performed.  He is now stable and ready to be discharged to skilled nursing facility for rehab.  Acute Ischemic Infarct:  left MCA territory infarct s/p mechanical thrombectomy and TNK Etiology: Large vessel occlusion Code Stroke CT head hyperdense left MCA ASPECTS 7 CTA head & neck left ICA occlusion in the neck, left MCA origin occluded MRI acute left MCA territory infarct with confluent petechial hemorrhage, cytotoxic edema and mild regional mass effect with trace rightward midline shift 2D Echo EF 50 to 55%, moderately dilated left atrium, mildly dilated right atrium, grossly normal atrial septum Will likely need loop recorder at discharge LDL 55 HgbA1c 5.8 VTE prophylaxis -SCDs No antithrombotic prior to admission, now on aspirin  81 mg daily due to petechial hemorrhage Therapy recommendations:  CIR Disposition: Pending   Hypertension Home meds: Losartan  100 mg daily Stable Blood Pressure Goal: SBP 120-160 for first 24 hours then less than 180    Hyperlipidemia Home meds: Rosuvastatin  20 mg daily, resumed in hospital LDL 55, goal < 70 Continue statin at discharge   Tobacco Abuse Patient smokes 0.25 packs per day for 40 years      Ready to quit? N/A Nicotine  replacement therapy provided   Substance Abuse Patient uses cocaine UDS positive  for Cocaine      Ready to quit? N/A TOC consult for cessation placed   Dysphagia Patient has post-stroke dysphagia, SLP consulted       Diet    DIET - DYS 1 Room service appropriate? No; Fluid consistency: Thin        Advance diet as tolerated   Other Stroke Risk Factors None     Other Active Problems HIV-continue home Biktarvy   RN Pressure Injury Documentation:     DISCHARGE EXAM  PHYSICAL EXAM General:  Alert, well-nourished, well-developed patient in no acute distress Psych:  Mood and affect appropriate for situation CV: Regular rate and rhythm on monitor Respiratory:  Regular, unlabored respirations on room air GI: Abdomen soft and nontender  NEURO:  Mental Status: AA&Ox3  Speech/Language: speech is without dysarthria or aphasia.  Naming, repetition, fluency, and comprehension intact.  Cranial Nerves:  II: PERRL. Visual fields full.  III, IV, VI: EOMI. Eyelids elevate symmetrically.  V: Sensation is intact to light touch and symmetrical to face.  VII: Smile is symmetrical.  VIII: hearing intact to voice. IX, X: Palate elevates symmetrically. Phonation is normal.  KP:Dynloizm shrug 5/5. XII: tongue is midline without fasciculations. Motor: 5/5 strength to all muscle groups tested.  Tone: is normal and bulk is normal Sensation- Intact to light touch bilaterally. Extinction absent to light touch to DSS. Coordination:  FTN intact bilaterally, HKS: no ataxia in BLE.No drift.  Gait- deferred  1a Level of Conscious.: *** 1b LOC Questions: *** 1c LOC Commands: *** 2 Best Gaze: *** 3 Visual: *** 4 Facial Palsy: *** 5a Motor Arm - left: *** 5b Motor Arm - Right: *** 6a Motor Leg - Left: *** 6b Motor Leg - Right: *** 7 Limb Ataxia: *** 8 Sensory: *** 9 Best Language: *** 10 Dysarthria: *** 11 Extinct. and Inatten.: *** TOTAL: ***   Discharge Diet       Diet   DIET - DYS 1 Room service appropriate? No; Fluid consistency: Thin   liquids  DISCHARGE  PLAN Disposition: Skilled nursing facility aspirin  81 mg daily for secondary stroke prevention  Ongoing stroke risk factor control by Primary Care Physician at time of discharge Follow-up [facility] [place] in 2 weeks. Follow-up in [facility] in 8 weeks, office to schedule an appointment. Able to see NP in clinic.  35 minutes were spent preparing discharge.  .sign

## 2025-01-01 NOTE — TOC Progression Note (Signed)
 Transition of Care [facility]) - Progression Note    Patient Details  Name: Roy [name] MRN: [id number] Date of Birth: [birth year]  Transition of Care [facility]) CM/SW Contact  Almarie CHRISTELLA Goodie, [place] Phone Number: 01/01/2025, 4:12 PM  Clinical Narrative:   Patient with no bed offers for SNF at this time. CSW to follow.    Expected Discharge Plan: Skilled Nursing Facility Barriers to Discharge: Insurance Authorization, Inadequate or no insurance, Active Substance Use - Placement, SNF Pending bed offer               Expected Discharge Plan and Services                                               Social Drivers of Health (SDOH) Interventions SDOH Screenings   Food Insecurity: No Food Insecurity (06/03/2024)  Housing: Patient Declined (05/04/2024)  Transportation Needs: Unmet Transportation Needs (06/03/2024)  Utilities: At Risk (06/03/2024)  Depression (PHQ2-9): Low Risk (06/08/2024)  Recent Concern: Depression (PHQ2-9) - High Risk (06/07/2024)  Financial Resource Strain: Medium Risk (01/20/2024)   Received from [facility]  Tobacco Use: High Risk (12/31/2024)  Health Literacy: Low Risk  (07/02/2022)   Received from [facility]    Readmission Risk Interventions     No data to display

## 2025-01-01 NOTE — Progress Notes (Signed)
 Physical Therapy Treatment Patient Details Name: Roy [name] MRN: [id number] DOB: 05-May-1966 Today's Date: 01/01/2025   History of Present Illness 59 yo M adm 12/27/24 from ETOH rehab center after fall with Rt weakness, AMS. Pt with Lt ICA/MCA CVA s/p Lt ICA/MCA thrombectomy complicated by Rt CFA occlusion from closure device requiring exploration and endarterectomy with angioplasty. Negative pressure dressing Rt groin  PMHx: cocaine & ETOH abuse, HIV, bipolar disorder, CAD    PT Comments  Pt with excellent progress in therapy today. Able to step pivot to chair with +2 Min A HHA. Pt also able to perform NMRE in stedy with great tolerance. Overall CGA in stedy. Pt remains and excellent candidate for AIR. No change in DC/DME recs at this time. PT will continue to follow.     If plan is discharge home, recommend the following: Two people to help with walking and/or transfers;A lot of help with bathing/dressing/bathroom;Assistance with feeding;Assistance with cooking/housework;Direct supervision/assist for financial management;Assist for transportation;Direct supervision/assist for medications management;Supervision due to cognitive status   Can travel by private vehicle        Equipment Recommendations  BSC/3in1;Wheelchair (measurements PT);Wheelchair cushion (measurements PT)    Recommendations for Other Services       Precautions / Restrictions Precautions Precautions: Fall;Other (comment) Recall of Precautions/Restrictions: Impaired Precaution/Restrictions Comments: Rt groin VAC, aphasia, Rt hemiparesis Restrictions Weight Bearing Restrictions Per Provider Order: No     Mobility  Bed Mobility Overal bed mobility: Needs Assistance Bed Mobility: Supine to Sit     Supine to sit: Min assist     General bed mobility comments: Min A to scoot hips forwards and to assist RLE.    Transfers Overall transfer level: Needs assistance Equipment used: 2 person hand held assist,  Ambulation equipment used Transfers: Sit to/from Stand, Bed to chair/wheelchair/BSC Sit to Stand: Min assist, Contact guard assist   Step pivot transfers: +2 physical assistance, Min assist       General transfer comment: Pt able to step pivot transfer to chair with +2 Mod A HHA. Pt with continued R knee hyperextension however able to pull RLE backwards and adduct towards chair. Attempted another stand from chair however pt noted with significant knee buckling so opted for stedy for safety. CGA to stand in stedy.    Ambulation/Gait               General Gait Details: Deferred   Stairs             Wheelchair Mobility     Tilt Bed    Modified Rankin (Stroke Patients Only) Modified Rankin (Stroke Patients Only) Pre-Morbid Rankin Score: No symptoms Modified Rankin: Moderately severe disability     Balance Overall balance assessment: Needs assistance Sitting-balance support: Feet supported Sitting balance-Leahy Scale: Fair Sitting balance - Comments: supervision for safety   Standing balance support: Bilateral upper extremity supported, During functional activity Standing balance-Leahy Scale: Poor Standing balance comment: Significant knee buckling today.                            Communication Communication Communication: Impaired Factors Affecting Communication: Difficulty expressing self  Cognition Arousal: Alert Behavior During Therapy: Flat affect   PT - Cognitive impairments: Difficult to assess Difficult to assess due to: Impaired communication                     PT - Cognition Comments: Able to nod yes to most questions.  Following commands: Impaired Following commands impaired: Follows one step commands with increased time, Only follows one step commands consistently    Cueing Cueing Techniques: Verbal cues, Gestural cues, Tactile cues  Exercises General Exercises - Lower Extremity Long Arc Quad: AAROM, Seated,  Strengthening, 10 reps, Both Mini-Sqauts: AROM, Both, 10 reps, Other (comment) (In stedy) Other Exercises Other Exercises: x10 sit to stands in stedy Other Exercises: Weight shifts x 10 in stedy Other Exercises: Modified pushups in stedy x20. 10 with BUE and 10 with RUE. Bicep and tricep activation noted.    General Comments General comments (skin integrity, edema, etc.): VSS. HR in the 60s for duration of session.      Pertinent Vitals/Pain Pain Assessment Pain Assessment: Faces Faces Pain Scale: No hurt    Home Living                          Prior Function            PT Goals (current goals can now be found in the care plan section) Progress towards PT goals: Progressing toward goals    Frequency    Min 3X/week      PT Plan      Co-evaluation              AM-PAC PT 6 Clicks Mobility   Outcome Measure  Help needed turning from your back to your side while in a flat bed without using bedrails?: A Lot Help needed moving from lying on your back to sitting on the side of a flat bed without using bedrails?: A Lot Help needed moving to and from a bed to a chair (including a wheelchair)?: Total Help needed standing up from a chair using your arms (e.g., wheelchair or bedside chair)?: A Little Help needed to walk in hospital room?: Total Help needed climbing 3-5 steps with a railing? : Total 6 Click Score: 10    End of Session Equipment Utilized During Treatment: Gait belt Activity Tolerance: Patient tolerated treatment well Patient left: in chair;with call bell/phone within reach;with chair alarm set Nurse Communication: Mobility status PT Visit Diagnosis: Other abnormalities of gait and mobility (R26.89);Difficulty in walking, not elsewhere classified (R26.2);Hemiplegia and hemiparesis;Other symptoms and signs involving the nervous system (R29.898) Hemiplegia - Right/Left: Right Hemiplegia - dominant/non-dominant: Dominant Hemiplegia - caused by:  Cerebral infarction     Time: 8497-8472 PT Time Calculation (min) (ACUTE ONLY): 25 min  Charges:    $Therapeutic Exercise: 8-22 mins $Neuromuscular Re-education: 8-22 mins PT General Charges $$ ACUTE PT VISIT: 1 Visit                     Sueellen NOVAK, PT, DPT Acute Rehab Services [phone]    Hamzeh Tall 01/01/2025, 5:04 PM

## 2025-01-01 NOTE — TOC CM/SW Note (Addendum)
 TOC consulted to assist in locating patient's lost keys and wallet. Security has no record of having collected patient's belongings and they are not present in the security room safe. RN updated.   Merilee Batty, MSN, RN Case Management [phone]   1630: Contacted the [facility] at [phone]. [name] sister has picked up all patient's belongings. Staff does not believe that his wallet or keys were present.

## 2025-01-01 NOTE — Plan of Care (Signed)
" °  Problem: Education: Goal: Knowledge of disease or condition will improve Outcome: Progressing   Problem: Health Behavior/Discharge Planning: Goal: Goals will be collaboratively established with patient/family Outcome: Progressing   Problem: Self-Care: Goal: Ability to participate in self-care as condition permits will improve Outcome: Progressing Goal: Ability to communicate needs accurately will improve Outcome: Progressing   "

## 2025-01-01 NOTE — Progress Notes (Addendum)
 STROKE TEAM PROGRESS NOTE    SIGNIFICANT HOSPITAL EVENTS 1/11: Patient admitted with aphasia and right-sided weakness, given TNK and mechanical thrombectomy performed.  INTERIM HISTORY/SUBJECTIVE Patient is seen in his room with his sister at the bedside.  He continues to be hemodynamically stable and afebrile, and neurological exam remains stable.  Family would like to send him to SNF for further rehab.  OBJECTIVE  CBC    Component Value Date/Time   WBC 5.1 12/31/2024 0213   RBC 3.15 (L) 12/31/2024 0213   HGB 10.8 (L) 12/31/2024 0213   HGB 14.4 01/14/2024 1043   HCT 31.2 (L) 12/31/2024 0213   HCT 42.6 01/14/2024 1043   PLT 137 (L) 12/31/2024 0213   PLT 220 01/14/2024 1043   MCV 99.0 12/31/2024 0213   MCV 101 (H) 01/14/2024 1043   MCV 99 01/06/2015 1505   MCH 34.3 (H) 12/31/2024 0213   MCHC 34.6 12/31/2024 0213   RDW 11.9 12/31/2024 0213   RDW 11.9 01/14/2024 1043   RDW 13.0 01/06/2015 1505   LYMPHSABS 0.9 12/27/2024 0705   LYMPHSABS 1.6 01/14/2024 1043   LYMPHSABS 2.6 01/06/2015 1505   MONOABS 0.4 12/27/2024 0705   MONOABS 0.3 01/06/2015 1505   EOSABS 0.0 12/27/2024 0705   EOSABS 0.1 01/14/2024 1043   EOSABS 0.1 01/06/2015 1505   BASOSABS 0.0 12/27/2024 0705   BASOSABS 0.0 01/14/2024 1043   BASOSABS 0.0 01/06/2015 1505    BMET    Component Value Date/Time   NA 138 12/31/2024 0213   NA 141 01/06/2015 1505   K 3.6 12/31/2024 0213   K 3.9 01/06/2015 1505   CL 105 12/31/2024 0213   CL 107 01/06/2015 1505   CO2 25 12/31/2024 0213   CO2 29 01/06/2015 1505   GLUCOSE 99 12/31/2024 0213   GLUCOSE 111 (H) 01/06/2015 1505   BUN 19 12/31/2024 0213   BUN 16 01/06/2015 1505   CREATININE 1.14 12/31/2024 0213   CREATININE 1.28 01/06/2015 1505   CALCIUM  8.4 (L) 12/31/2024 0213   CALCIUM  9.0 01/06/2015 1505   GFRNONAA >60 12/31/2024 0213   GFRNONAA >60 01/06/2015 1505   GFRNONAA >60 05/10/2013 0535    IMAGING past 24 hours No results found.   Vitals:   12/31/24  2300 01/01/25 0014 01/01/25 0500 01/01/25 0914  BP:  132/82 122/77 127/71  Pulse:  (!) 44 (!) 42 (!) 50  Resp:  16 16   Temp:  97.9 F (36.6 C) 98.9 F (37.2 C) 98.4 F (36.9 C)  TempSrc:  Oral Oral Oral  SpO2:  97% 96% 99%  Weight:      Height: 5' 9 (1.753 m)        PHYSICAL EXAM General:  Alert, well-nourished, well-developed patient in no acute distress Psych:  Mood and affect appropriate for situation CV: Regular rate and rhythm on monitor Respiratory:  Regular, unlabored respirations on room air   NEURO:  Mental Status: Patient remains globally aphasic but responds to name and intermittently follows simple commands.  He will mimic Speech/Language: No verbal output, some vocalizations but no recognizable words  Cranial Nerves:  II: PERRL.  III, IV, VI: Left gaze deviation, able to go to midline VII: Face is symmetrical resting  VIII: hearing intact to voice. XII: tongue is midline without fasciculations. Motor: Able to move left upper and lower extremities with antigravity strength, able to move right upper extremity but cannot break gravity, moves right lower extremity but also does not break gravity Tone: is normal and bulk  is normal Sensation- Intact to light touch bilaterally.  Coordination: Unable to perform Gait- deferred  Most Recent NIH  1a Level of Conscious.: 0 1b LOC Questions: 2 1c LOC Commands: 1 2 Best Gaze: 1 3 Visual: 0 4 Facial Palsy: 0 5a Motor Arm - left: 0 5b Motor Arm - Right: 3 6a Motor Leg - Left: 0 6b Motor Leg - Right: 3 7 Limb Ataxia: 0 8 Sensory: 0 9 Best Language: 3 10 Dysarthria: 2 11 Extinct. and Inatten.: 0 TOTAL: 15   ASSESSMENT/PLAN  [name] is a 59 y.o. male with history of HIV, hypertension, substance abuse and alcohol abuse admitted for acute onset right-sided weakness and aphasia.  He was given TNK to treat stroke and was found to have occlusion of left ICA/MCA, and mechanical thrombectomy was  successfully performed.  NIH on Admission 30  Acute Ischemic Infarct:  left MCA territory infarct s/p mechanical thrombectomy and TNK Etiology: Large vessel occlusion Code Stroke CT head hyperdense left MCA ASPECTS 7 CTA head & neck left ICA occlusion in the neck, left MCA origin occluded MRI acute left MCA territory infarct with confluent petechial hemorrhage, cytotoxic edema and mild regional mass effect with trace rightward midline shift 2D Echo EF 50 to 55%, moderately dilated left atrium, mildly dilated right atrium, grossly normal atrial septum Will likely need loop recorder at discharge LDL 55 HgbA1c 5.8 VTE prophylaxis -SCDs No antithrombotic prior to admission, now on aspirin  81 mg daily due to petechial hemorrhage Therapy recommendations:  CIR Disposition: Pending  Hypertension Home meds: Losartan  100 mg daily Stable Blood Pressure Goal: SBP 120-160 for first 24 hours then less than 180   Hyperlipidemia Home meds: Rosuvastatin  20 mg daily, resumed in hospital LDL 55, goal < 70 Continue statin at discharge  Tobacco Abuse Patient smokes 0.25 packs per day for 40 years      Ready to quit? N/A Nicotine  replacement therapy provided  Substance Abuse Patient uses cocaine UDS positive for Cocaine      Ready to quit? N/A TOC consult for cessation placed  Dysphagia Patient has post-stroke dysphagia, SLP consulted    Diet   DIET - DYS 1 Room service appropriate? No; Fluid consistency: Thin   Advance diet as tolerated  Other Stroke Risk Factors None   Other Active Problems HIV-continue home Biktarvy   Hospital day # 5  Patient seen and examined by NP/APP with MD. MD to update note as needed.   Cortney E Everitt Clint Kill , MSN, AGACNP-BC [facility] See Amion for schedule and pager information 01/01/2025 11:05 AM  I have personally obtained history,examined this patient, reviewed notes, independently viewed imaging studies, participated in medical  decision making and plan of care.ROS completed by me personally and pertinent positives fully documented  I have made any additions or clarifications directly to the above note. Agree with note above.    Eather Popp, MD Medical Director [facility] Pager: [phone] 01/01/2025 2:46 PM  To contact Stroke Continuity provider, please refer to [email or web address]. After hours, contact General Neurology

## 2025-01-02 DIAGNOSIS — F1410 Cocaine abuse, uncomplicated: Secondary | ICD-10-CM | POA: Diagnosis not present

## 2025-01-02 DIAGNOSIS — I69391 Dysphagia following cerebral infarction: Secondary | ICD-10-CM | POA: Diagnosis not present

## 2025-01-02 DIAGNOSIS — R29715 NIHSS score 15: Secondary | ICD-10-CM | POA: Diagnosis not present

## 2025-01-02 DIAGNOSIS — F1010 Alcohol abuse, uncomplicated: Secondary | ICD-10-CM | POA: Diagnosis not present

## 2025-01-02 DIAGNOSIS — F17210 Nicotine dependence, cigarettes, uncomplicated: Secondary | ICD-10-CM | POA: Diagnosis not present

## 2025-01-02 DIAGNOSIS — I63232 Cerebral infarction due to unspecified occlusion or stenosis of left carotid arteries: Secondary | ICD-10-CM | POA: Diagnosis not present

## 2025-01-02 DIAGNOSIS — B20 Human immunodeficiency virus [HIV] disease: Secondary | ICD-10-CM | POA: Diagnosis not present

## 2025-01-02 DIAGNOSIS — R233 Spontaneous ecchymoses: Secondary | ICD-10-CM | POA: Diagnosis not present

## 2025-01-02 DIAGNOSIS — I10 Essential (primary) hypertension: Secondary | ICD-10-CM | POA: Diagnosis not present

## 2025-01-02 DIAGNOSIS — I779 Disorder of arteries and arterioles, unspecified: Secondary | ICD-10-CM | POA: Diagnosis not present

## 2025-01-02 DIAGNOSIS — G936 Cerebral edema: Secondary | ICD-10-CM | POA: Diagnosis not present

## 2025-01-02 DIAGNOSIS — E785 Hyperlipidemia, unspecified: Secondary | ICD-10-CM | POA: Diagnosis not present

## 2025-01-02 LAB — CBC
HCT: 31.6 % — ABNORMAL LOW (ref 39.0–52.0)
Hemoglobin: 11 g/dL — ABNORMAL LOW (ref 13.0–17.0)
MCH: 34.2 pg — ABNORMAL HIGH (ref 26.0–34.0)
MCHC: 34.8 g/dL (ref 30.0–36.0)
MCV: 98.1 fL (ref 80.0–100.0)
Platelets: 148 K/uL — ABNORMAL LOW (ref 150–400)
RBC: 3.22 MIL/uL — ABNORMAL LOW (ref 4.22–5.81)
RDW: 12.3 % (ref 11.5–15.5)
WBC: 4.7 K/uL (ref 4.0–10.5)
nRBC: 0 % (ref 0.0–0.2)

## 2025-01-02 LAB — BASIC METABOLIC PANEL WITH GFR
Anion gap: 7 (ref 5–15)
BUN: 18 mg/dL (ref 6–20)
CO2: 25 mmol/L (ref 22–32)
Calcium: 8.7 mg/dL — ABNORMAL LOW (ref 8.9–10.3)
Chloride: 104 mmol/L (ref 98–111)
Creatinine, Ser: 1.12 mg/dL (ref 0.61–1.24)
GFR, Estimated: >60 mL/min
Glucose, Bld: 93 mg/dL (ref 70–99)
Potassium: 4 mmol/L (ref 3.5–5.1)
Sodium: 136 mmol/L (ref 135–145)

## 2025-01-02 NOTE — Progress Notes (Signed)
 Physical Therapy Treatment Patient Details Name: Roy [name] MRN: [id number] DOB: October 14, 1966 Today's Date: 01/02/2025   History of Present Illness 59 yo M adm 12/27/24 from ETOH rehab center after fall with Rt weakness, AMS. Pt with Lt ICA/MCA CVA s/p Lt ICA/MCA thrombectomy complicated by Rt CFA occlusion from closure device requiring exploration and endarterectomy with angioplasty. Negative pressure dressing Rt groin  PMHx: cocaine & ETOH abuse, HIV, bipolar disorder, CAD    PT Comments  Patient agreeable to participated in PT. Reported mild dizziness with all activity, vitals WNL. Patient able to complete multiple repetitions for sit to stand transfers with min-CGA. Patient also able to participate in lateral stepping toward Left side with modA today. Fatigue noted in R LE with increased activity, demonstrating decreased ability to reposition without assistance. Continues to progress toward all acute PT goals and continue to recommend post-acute rehab > 3 hours/day.    If plan is discharge home, recommend the following: Two people to help with walking and/or transfers;A lot of help with bathing/dressing/bathroom;Assistance with feeding;Assistance with cooking/housework;Direct supervision/assist for financial management;Assist for transportation;Direct supervision/assist for medications management;Supervision due to cognitive status   Can travel by private vehicle        Equipment Recommendations  BSC/3in1;Wheelchair (measurements PT);Wheelchair cushion (measurements PT)    Recommendations for Other Services Rehab consult     Precautions / Restrictions Precautions Precautions: Fall;Other (comment) Recall of Precautions/Restrictions: Impaired Precaution/Restrictions Comments: foley, aphasia, Rt hemiparesis Restrictions Weight Bearing Restrictions Per Provider Order: No     Mobility  Bed Mobility Overal bed mobility: Needs Assistance Bed Mobility: Supine to Sit, Sit to  Supine   Sidelying to sit: Min assist Supine to sit: Min assist     General bed mobility comments: minA for assist at R LE for supine <> sit transfers. In seated position, minA for R hip scooting anteriorly    Transfers Overall transfer level: Needs assistance Equipment used: Ambulation equipment used Transfers: Sit to/from Stand Sit to Stand: Min assist, Contact guard assist           General transfer comment: Sit to stand repetitions from EOB with cues for hand placement, good carryover. Standing to 2WW for L UE support in standing, PT assisted on R side. Min-CGA x 10 transfers. Able to transfer UE onto RW with CGA/minA for stability.    Ambulation/Gait Ambulation/Gait assistance: Mod assist Gait Distance (Feet): 2 Feet           General Gait Details: Side stepping at EOB toward Left side, modA for stability. Significantly shortened step length with decreased ability to accept weight R LE.   Stairs             Wheelchair Mobility     Tilt Bed    Modified Rankin (Stroke Patients Only)       Balance Overall balance assessment: Needs assistance Sitting-balance support: Feet supported Sitting balance-Leahy Scale: Fair Sitting balance - Comments: supervision for safety, no trunk lean noted   Standing balance support: No upper extremity supported Standing balance-Leahy Scale: Fair Standing balance comment: Fair-, patient able to participate in unsupported static standing for 30-45 seconds each attempt. No support on back of legs.                            Communication Communication Communication: Impaired Factors Affecting Communication: Difficulty expressing self  Cognition Arousal: Alert Behavior During Therapy: WFL for tasks assessed/performed   PT - Cognitive impairments: Difficult  to assess Difficult to assess due to: Impaired communication                     PT - Cognition Comments: able to nod yes/no >75% of the  time Following commands: Impaired Following commands impaired: Follows one step commands with increased time, Only follows one step commands consistently (~75% accuracy for command following)    Cueing Cueing Techniques: Verbal cues, Gestural cues, Tactile cues  Exercises Other Exercises Other Exercises: x10 sit to stand transfer reps. Standing weight shift with minA. Static standing, unsupported 30-45 seconds x 4 reps.    General Comments        Pertinent Vitals/Pain Pain Assessment Pain Assessment: Faces Faces Pain Scale: No hurt    Home Living                          Prior Function            PT Goals (current goals can now be found in the care plan section) Progress towards PT goals: Progressing toward goals    Frequency    Min 3X/week      PT Plan      Co-evaluation              AM-PAC PT 6 Clicks Mobility   Outcome Measure  Help needed turning from your back to your side while in a flat bed without using bedrails?: A Little Help needed moving from lying on your back to sitting on the side of a flat bed without using bedrails?: A Lot Help needed moving to and from a bed to a chair (including a wheelchair)?: A Lot Help needed standing up from a chair using your arms (e.g., wheelchair or bedside chair)?: A Little Help needed to walk in hospital room?: Total Help needed climbing 3-5 steps with a railing? : Total 6 Click Score: 12    End of Session Equipment Utilized During Treatment: Gait belt Activity Tolerance: Patient tolerated treatment well Patient left: with call bell/phone within reach;in bed (R UE elevated with pillows)   PT Visit Diagnosis: Other abnormalities of gait and mobility (R26.89);Difficulty in walking, not elsewhere classified (R26.2);Hemiplegia and hemiparesis;Other symptoms and signs involving the nervous system (R29.898) Hemiplegia - Right/Left: Right Hemiplegia - dominant/non-dominant: Dominant Hemiplegia - caused  by: Cerebral infarction     Time: 1300-1333 PT Time Calculation (min) (ACUTE ONLY): 33 min  Charges:    $Therapeutic Activity: 23-37 mins PT General Charges $$ ACUTE PT VISIT: 1 Visit                     Sherryle Roosevelt, PT, DPT [facility] Acute Rehabilitation Office: [phone]    Sherryle VEAR [place] 01/02/2025, 1:55 PM

## 2025-01-02 NOTE — Plan of Care (Signed)
" °  Problem: Education: Goal: Knowledge of disease or condition will improve Outcome: Progressing   Problem: Coping: Goal: Will identify appropriate support needs Outcome: Progressing   Problem: Health Behavior/Discharge Planning: Goal: Goals will be collaboratively established with patient/family Outcome: Progressing   Problem: Nutrition: Goal: Risk of aspiration will decrease Outcome: Progressing Goal: Dietary intake will improve Outcome: Progressing   Problem: Bowel/Gastric: Goal: Gastrointestinal status for postoperative course will improve Outcome: Progressing   Problem: Nutritional: Goal: Will attain and maintain optimal nutritional status Outcome: Progressing   Problem: Neurological: Goal: Will regain or maintain usual level of consciousness Outcome: Progressing   Problem: Respiratory: Goal: Respiratory status will improve Outcome: Progressing   Problem: Skin Integrity: Goal: Demonstrates signs of wound healing without infection Outcome: Progressing   Problem: Urinary Elimination: Goal: Will remain free from infection Outcome: Progressing   Problem: Education: Goal: Knowledge of General Education information will improve Description: Including pain rating scale, medication(s)/side effects and non-pharmacologic comfort measures Outcome: Progressing   Problem: Clinical Measurements: Goal: Will remain free from infection Outcome: Progressing Goal: Diagnostic test results will improve Outcome: Progressing Goal: Respiratory complications will improve Outcome: Progressing Goal: Cardiovascular complication will be avoided Outcome: Progressing   Problem: Activity: Goal: Risk for activity intolerance will decrease Outcome: Progressing   Problem: Coping: Goal: Level of anxiety will decrease Outcome: Progressing   Problem: Elimination: Goal: Will not experience complications related to bowel motility Outcome: Progressing Goal: Will not experience  complications related to urinary retention Outcome: Progressing   "

## 2025-01-02 NOTE — Progress Notes (Addendum)
 STROKE TEAM PROGRESS NOTE    SIGNIFICANT HOSPITAL EVENTS 1/11: Patient admitted with aphasia and right-sided weakness, given TNK and mechanical thrombectomy performed.  INTERIM HISTORY/SUBJECTIVE Patient is seen in his room with no family at the bedside.  He remains hemodynamically stable, and neurological exam remains stable.  He is awaiting discharge to SNF.  OBJECTIVE  CBC    Component Value Date/Time   WBC 4.7 01/02/2025 0633   RBC 3.22 (L) 01/02/2025 0633   HGB 11.0 (L) 01/02/2025 0633   HGB 14.4 01/14/2024 1043   HCT 31.6 (L) 01/02/2025 0633   HCT 42.6 01/14/2024 1043   PLT 148 (L) 01/02/2025 0633   PLT 220 01/14/2024 1043   MCV 98.1 01/02/2025 0633   MCV 101 (H) 01/14/2024 1043   MCV 99 01/06/2015 1505   MCH 34.2 (H) 01/02/2025 0633   MCHC 34.8 01/02/2025 0633   RDW 12.3 01/02/2025 0633   RDW 11.9 01/14/2024 1043   RDW 13.0 01/06/2015 1505   LYMPHSABS 0.9 12/27/2024 0705   LYMPHSABS 1.6 01/14/2024 1043   LYMPHSABS 2.6 01/06/2015 1505   MONOABS 0.4 12/27/2024 0705   MONOABS 0.3 01/06/2015 1505   EOSABS 0.0 12/27/2024 0705   EOSABS 0.1 01/14/2024 1043   EOSABS 0.1 01/06/2015 1505   BASOSABS 0.0 12/27/2024 0705   BASOSABS 0.0 01/14/2024 1043   BASOSABS 0.0 01/06/2015 1505    BMET    Component Value Date/Time   NA 136 01/02/2025 0633   NA 141 01/06/2015 1505   K 4.0 01/02/2025 0633   K 3.9 01/06/2015 1505   CL 104 01/02/2025 0633   CL 107 01/06/2015 1505   CO2 25 01/02/2025 0633   CO2 29 01/06/2015 1505   GLUCOSE 93 01/02/2025 0633   GLUCOSE 111 (H) 01/06/2015 1505   BUN 18 01/02/2025 0633   BUN 16 01/06/2015 1505   CREATININE 1.12 01/02/2025 0633   CREATININE 1.28 01/06/2015 1505   CALCIUM  8.7 (L) 01/02/2025 0633   CALCIUM  9.0 01/06/2015 1505   GFRNONAA >60 01/02/2025 0633   GFRNONAA >60 01/06/2015 1505   GFRNONAA >60 05/10/2013 0535    IMAGING past 24 hours No results found.   Vitals:   01/02/25 0014 01/02/25 0532 01/02/25 0716 01/02/25  1134  BP: 137/78 118/75 113/75 108/62  Pulse: (!) 41 (!) 43 (!) 43 (!) 41  Resp: 12 14 15 16   Temp: 98.2 F (36.8 C) 97.8 F (36.6 C) 98.6 F (37 C) 98.6 F (37 C)  TempSrc: Axillary Oral    SpO2: 97% 95% 98% 99%  Weight:      Height:         PHYSICAL EXAM General:  Alert, well-nourished, well-developed patient in no acute distress Psych:  Mood and affect appropriate for situation CV: Regular rate and rhythm on monitor Respiratory:  Regular, unlabored respirations on room air   NEURO:  Mental Status: Patient remains globally aphasic but responds to name and intermittently follows simple commands.  He will mimic Speech/Language: No verbal output, some vocalizations but no recognizable words  Cranial Nerves:  II: PERRL.  III, IV, VI: Left gaze deviation, able to go to midline VII: Face is symmetrical resting  VIII: hearing intact to voice. XII: tongue is midline without fasciculations. Motor: Able to move left upper and lower extremities with antigravity strength, able to move right upper extremity but cannot break gravity, moves right lower extremity but also does not break gravity Tone: is normal and bulk is normal Sensation- Intact to light touch bilaterally.  Coordination: Unable to perform Gait- deferred  Most Recent NIH  1a Level of Conscious.: 0 1b LOC Questions: 2 1c LOC Commands: 1 2 Best Gaze: 1 3 Visual: 0 4 Facial Palsy: 0 5a Motor Arm - left: 0 5b Motor Arm - Right: 3 6a Motor Leg - Left: 0 6b Motor Leg - Right: 3 7 Limb Ataxia: 0 8 Sensory: 0 9 Best Language: 3 10 Dysarthria: 2 11 Extinct. and Inatten.: 0 TOTAL: 15   ASSESSMENT/PLAN  [name] is a 59 y.o. male with history of HIV, hypertension, substance abuse and alcohol abuse admitted for acute onset right-sided weakness and aphasia.  He was given TNK to treat stroke and was found to have occlusion of left ICA/MCA, and mechanical thrombectomy was successfully performed.  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 s/p TNK and IR with TICI 3, etiology: Likely large vessel occlusion in the setting of multiple risk factors Code Stroke CT head hyperdense left MCA ASPECTS 7 CTA head & neck left ICA occlusion in the neck, left MCA origin occluded MRI acute left MCA territory infarct with confluent petechial hemorrhage, cytotoxic edema and mild regional mass effect with trace rightward midline shift 2D Echo EF 50 to 55% Will need 30-day monitor at discharge LDL 55 HgbA1c 5.8 UDS positive for cocaine, benzo and fentanyl  VTE prophylaxis - heparin  subcu No antithrombotic prior to admission, now on aspirin  81 mg daily due to complaint petechial hemorrhage Therapy recommendations:  CIR but family would like SNF Disposition: Pending  Hypertension Home meds: Losartan  100 mg daily Stable Long-term BP goal normotensive  Hyperlipidemia Home meds: Rosuvastatin  20 mg daily, resumed in hospital LDL 55, goal < 70 Continue statin at discharge  Tobacco Abuse Patient smokes 0.25 packs per day for 40 years      Ready to quit? N/A Nicotine  replacement therapy provided  Cocaine abuse Patient uses cocaine UDS positive for Cocaine      Ready to quit? N/A TOC consult for cessation placed  Dysphagia Patient has post-stroke dysphagia, SLP consulted Now on dysphagia 1 and thin liquid Aspiration precautions Advance diet as tolerated  Other Stroke Risk Factors Alcohol abuse HIV, on HARRT therapy  Other Active Problems Mild thrombocytopenia, platelet 137  Hospital day # 6  Patient seen and examined by NP/APP with MD. MD to update note as needed.   Cortney E Everitt Clint Kill , MSN, AGACNP-BC [facility] See Amion for schedule and pager information 01/02/2025 12:49 PM  ATTENDING NOTE: I reviewed above note and agree with the assessment and plan. Pt was seen and examined.   NT is at bedside. Pt reclining in  bed for dinner. Awake, alert, eyes open, expressive aphasia, not able to answer questions, name or repeat. However, follows all simple commands. No gaze palsy, tracking bilaterally, visual field full. R facial droop. Tongue midline. LUE 5/5, RUE flaccid, LLE proximal 4/5 and distally 4/5. RLE proximal 3/5 and distally 2/5. Sensation subjectively symmetrical bilaterally, L FTN intact, gait not tested.   For detailed assessment and plan, please refer to above as I have made changes wherever appropriate.   Ary Cummins, MD PhD Stroke Neurology 01/02/2025 4:53 PM    To contact Stroke Continuity provider, please refer to [email or web address]. After hours, contact General Neurology

## 2025-01-03 DIAGNOSIS — R233 Spontaneous ecchymoses: Secondary | ICD-10-CM | POA: Diagnosis not present

## 2025-01-03 DIAGNOSIS — R29715 NIHSS score 15: Secondary | ICD-10-CM | POA: Diagnosis not present

## 2025-01-03 DIAGNOSIS — I63232 Cerebral infarction due to unspecified occlusion or stenosis of left carotid arteries: Secondary | ICD-10-CM | POA: Diagnosis not present

## 2025-01-03 DIAGNOSIS — I779 Disorder of arteries and arterioles, unspecified: Secondary | ICD-10-CM | POA: Diagnosis not present

## 2025-01-03 LAB — BASIC METABOLIC PANEL WITH GFR
Anion gap: 9 (ref 5–15)
BUN: 18 mg/dL (ref 6–20)
CO2: 24 mmol/L (ref 22–32)
Calcium: 8.6 mg/dL — ABNORMAL LOW (ref 8.9–10.3)
Chloride: 102 mmol/L (ref 98–111)
Creatinine, Ser: 1.14 mg/dL (ref 0.61–1.24)
GFR, Estimated: >60 mL/min
Glucose, Bld: 93 mg/dL (ref 70–99)
Potassium: 4.2 mmol/L (ref 3.5–5.1)
Sodium: 135 mmol/L (ref 135–145)

## 2025-01-03 LAB — CBC
HCT: 33.5 % — ABNORMAL LOW (ref 39.0–52.0)
Hemoglobin: 11.5 g/dL — ABNORMAL LOW (ref 13.0–17.0)
MCH: 33.7 pg (ref 26.0–34.0)
MCHC: 34.3 g/dL (ref 30.0–36.0)
MCV: 98.2 fL (ref 80.0–100.0)
Platelets: 153 K/uL (ref 150–400)
RBC: 3.41 MIL/uL — ABNORMAL LOW (ref 4.22–5.81)
RDW: 12.5 % (ref 11.5–15.5)
WBC: 5.9 K/uL (ref 4.0–10.5)
nRBC: 0 % (ref 0.0–0.2)

## 2025-01-03 LAB — HIV-1 RNA QUANT-NO REFLEX-BLD
HIV 1 RNA Quant: <20 {copies}/mL
LOG10 HIV-1 RNA: UNDETERMINED {Log_copies}/mL

## 2025-01-03 NOTE — Plan of Care (Signed)
 " Problem: Education: Goal: Knowledge of disease or condition will improve Outcome: Progressing Goal: Knowledge of secondary prevention will improve (MUST DOCUMENT ALL) Outcome: Progressing Goal: Knowledge of patient specific risk factors will improve (DELETE if not current risk factor) Outcome: Progressing   Problem: Ischemic Stroke/TIA Tissue Perfusion: Goal: Complications of ischemic stroke/TIA will be minimized Outcome: Progressing   Problem: Coping: Goal: Will verbalize positive feelings about self Outcome: Progressing Goal: Will identify appropriate support needs Outcome: Progressing   Problem: Health Behavior/Discharge Planning: Goal: Ability to manage health-related needs will improve Outcome: Progressing Goal: Goals will be collaboratively established with patient/family Outcome: Progressing   Problem: Self-Care: Goal: Ability to participate in self-care as condition permits will improve Outcome: Progressing Goal: Verbalization of feelings and concerns over difficulty with self-care will improve Outcome: Progressing Goal: Ability to communicate needs accurately will improve Outcome: Progressing   Problem: Nutrition: Goal: Risk of aspiration will decrease Outcome: Progressing Goal: Dietary intake will improve Outcome: Progressing   Problem: Education: Goal: Knowledge of the prescribed therapeutic regimen will improve Outcome: Progressing   Problem: Bowel/Gastric: Goal: Gastrointestinal status for postoperative course will improve Outcome: Progressing   Problem: Cardiac: Goal: Ability to maintain an adequate cardiac output Outcome: Progressing Goal: Will show no evidence of cardiac arrhythmias Outcome: Progressing   Problem: Nutritional: Goal: Will attain and maintain optimal nutritional status Outcome: Progressing   Problem: Neurological: Goal: Will regain or maintain usual level of consciousness Outcome: Progressing   Problem: Clinical  Measurements: Goal: Ability to maintain clinical measurements within normal limits Outcome: Progressing Goal: Postoperative complications will be avoided or minimized Outcome: Progressing   Problem: Respiratory: Goal: Will regain and/or maintain adequate ventilation Outcome: Progressing Goal: Respiratory status will improve Outcome: Progressing   Problem: Skin Integrity: Goal: Demonstrates signs of wound healing without infection Outcome: Progressing   Problem: Urinary Elimination: Goal: Will remain free from infection Outcome: Progressing Goal: Ability to achieve and maintain adequate urine output Outcome: Progressing   Problem: Education: Goal: Knowledge of General Education information will improve Description: Including pain rating scale, medication(s)/side effects and non-pharmacologic comfort measures Outcome: Progressing   Problem: Health Behavior/Discharge Planning: Goal: Ability to manage health-related needs will improve Outcome: Progressing   Problem: Clinical Measurements: Goal: Ability to maintain clinical measurements within normal limits will improve Outcome: Progressing Goal: Will remain free from infection Outcome: Progressing Goal: Diagnostic test results will improve Outcome: Progressing Goal: Respiratory complications will improve Outcome: Progressing Goal: Cardiovascular complication will be avoided Outcome: Progressing   Problem: Activity: Goal: Risk for activity intolerance will decrease Outcome: Progressing   Problem: Nutrition: Goal: Adequate nutrition will be maintained Outcome: Progressing   Problem: Coping: Goal: Level of anxiety will decrease Outcome: Progressing   Problem: Elimination: Goal: Will not experience complications related to bowel motility Outcome: Progressing Goal: Will not experience complications related to urinary retention Outcome: Progressing   Problem: Pain Managment: Goal: General experience of comfort will  improve and/or be controlled Outcome: Progressing   Problem: Safety: Goal: Ability to remain free from injury will improve Outcome: Progressing   Problem: Skin Integrity: Goal: Risk for impaired skin integrity will decrease Outcome: Progressing   Problem: Education: Goal: Understanding of CV disease, CV risk reduction, and recovery process will improve Outcome: Progressing Goal: Individualized Educational Video(s) Outcome: Progressing   Problem: Activity: Goal: Ability to return to baseline activity level will improve Outcome: Progressing   Problem: Cardiovascular: Goal: Ability to achieve and maintain adequate cardiovascular perfusion will improve Outcome: Progressing Goal: Vascular access site(s) Level 0-1 will  be maintained Outcome: Progressing   Problem: Health Behavior/Discharge Planning: Goal: Ability to safely manage health-related needs after discharge will improve Outcome: Progressing   "

## 2025-01-03 NOTE — Progress Notes (Addendum)
 STROKE TEAM PROGRESS NOTE    SIGNIFICANT HOSPITAL EVENTS 1/11: Patient admitted with aphasia and right-sided weakness, given TNK and mechanical thrombectomy performed.  INTERIM HISTORY/SUBJECTIVE Patient continues to be hemodynamically stable with stable neuroexam.  Roy [name] is awaiting discharge to SNF for rehabilitation.  OBJECTIVE  CBC    Component Value Date/Time   WBC 5.9 01/03/2025 0428   RBC 3.41 (L) 01/03/2025 0428   HGB 11.5 (L) 01/03/2025 0428   HGB 14.4 01/14/2024 1043   HCT 33.5 (L) 01/03/2025 0428   HCT 42.6 01/14/2024 1043   PLT 153 01/03/2025 0428   PLT 220 01/14/2024 1043   MCV 98.2 01/03/2025 0428   MCV 101 (H) 01/14/2024 1043   MCV 99 01/06/2015 1505   MCH 33.7 01/03/2025 0428   MCHC 34.3 01/03/2025 0428   RDW 12.5 01/03/2025 0428   RDW 11.9 01/14/2024 1043   RDW 13.0 01/06/2015 1505   LYMPHSABS 0.9 12/27/2024 0705   LYMPHSABS 1.6 01/14/2024 1043   LYMPHSABS 2.6 01/06/2015 1505   MONOABS 0.4 12/27/2024 0705   MONOABS 0.3 01/06/2015 1505   EOSABS 0.0 12/27/2024 0705   EOSABS 0.1 01/14/2024 1043   EOSABS 0.1 01/06/2015 1505   BASOSABS 0.0 12/27/2024 0705   BASOSABS 0.0 01/14/2024 1043   BASOSABS 0.0 01/06/2015 1505    BMET    Component Value Date/Time   NA 135 01/03/2025 0428   NA 141 01/06/2015 1505   K 4.2 01/03/2025 0428   K 3.9 01/06/2015 1505   CL 102 01/03/2025 0428   CL 107 01/06/2015 1505   CO2 24 01/03/2025 0428   CO2 29 01/06/2015 1505   GLUCOSE 93 01/03/2025 0428   GLUCOSE 111 (H) 01/06/2015 1505   BUN 18 01/03/2025 0428   BUN 16 01/06/2015 1505   CREATININE 1.14 01/03/2025 0428   CREATININE 1.28 01/06/2015 1505   CALCIUM  8.6 (L) 01/03/2025 0428   CALCIUM  9.0 01/06/2015 1505   GFRNONAA >60 01/03/2025 0428   GFRNONAA >60 01/06/2015 1505   GFRNONAA >60 05/10/2013 0535    IMAGING past 24 hours No results found.   Vitals:   01/02/25 2215 01/03/25 0453 01/03/25 0804 01/03/25 1122  BP: 130/68 129/77 124/72   Pulse: (!) 41 (!) 44  (!) 41 (!) 44  Resp: 18 16 18 18   Temp: 97.7 F (36.5 C) 97.6 F (36.4 C) 98 F (36.7 C) 98.2 F (36.8 C)  TempSrc: Oral Oral Oral Oral  SpO2: 94% 100% 100% 98%  Weight:      Height:         PHYSICAL EXAM General:  Alert, well-nourished, well-developed patient in no acute distress Psych:  Mood and affect appropriate for situation CV: Regular rate and rhythm on monitor Respiratory:  Regular, unlabored respirations on room air   NEURO:  Mental Status: Patient remains globally aphasic but responds to name and intermittently follows simple commands.  Roy [name] will mimic Speech/Language: No verbal output, some vocalizations but no recognizable words  Cranial Nerves:  II: PERRL.  III, IV, VI: Left gaze deviation, able to go to midline VII: Face is symmetrical resting  VIII: hearing intact to voice. XII: tongue is midline without fasciculations. Motor: Able to move left upper and lower extremities with antigravity strength, able to move right upper extremity but cannot break gravity, moves right lower extremity but also does not break gravity Tone: is normal and bulk is normal Sensation- Intact to light touch bilaterally.  Coordination: Unable to perform Gait- deferred  Most Recent NIH  1a  Level of Conscious.: 0 1b LOC Questions: 2 1c LOC Commands: 1 2 Best Gaze: 1 3 Visual: 0 4 Facial Palsy: 0 5a Motor Arm - left: 0 5b Motor Arm - Right: 3 6a Motor Leg - Left: 0 6b Motor Leg - Right: 3 7 Limb Ataxia: 0 8 Sensory: 0 9 Best Language: 3 10 Dysarthria: 2 11 Extinct. and Inatten.: 0 TOTAL: 15   ASSESSMENT/PLAN  Roy [name] is a 59 y.o. male with history of HIV, hypertension, substance abuse and alcohol abuse admitted for acute onset right-sided weakness and aphasia.  Roy [name] was given TNK to treat stroke and was found to have occlusion of left ICA/MCA, and mechanical thrombectomy was successfully performed.  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 s/p TNK and IR with TICI 3, etiology: Likely large vessel occlusion in the setting of multiple risk factors Code Stroke CT head hyperdense left MCA ASPECTS 7 CTA head & neck left ICA occlusion in the neck, left MCA origin occluded MRI acute left MCA territory infarct with confluent petechial hemorrhage, cytotoxic edema and mild regional mass effect with trace rightward midline shift 2D Echo EF 50 to 55% Will need 30-day monitor at discharge LDL 55 HgbA1c 5.8 UDS positive for cocaine, benzo and fentanyl  VTE prophylaxis - heparin  subcu No antithrombotic prior to admission, now on aspirin  81 mg daily due to petechial hemorrhage Therapy recommendations:  CIR but family would like SNF Disposition: Pending  Hypertension Home meds: Losartan  100 mg daily Stable Long-term BP goal normotensive  Hyperlipidemia Home meds: Rosuvastatin  20 mg daily, resumed in hospital LDL 55, goal < 70 Continue statin at discharge  Tobacco Abuse Patient smokes 0.25 packs per day for 40 years      Ready to quit? N/A Nicotine  replacement therapy provided  Cocaine abuse Patient uses cocaine UDS positive for Cocaine      Ready to quit? N/A TOC consult for cessation placed  Dysphagia Patient has post-stroke dysphagia, SLP consulted Now on dysphagia 1 and thin liquid Aspiration precautions Advance diet as tolerated  Other Stroke Risk Factors Alcohol abuse HIV, on HARRT therapy  Other Active Problems Mild thrombocytopenia, platelet 137--153  Hospital day # 7  Patient seen and examined by NP/APP with MD. MD to update note as needed.   Cortney E Everitt Clint Kill , MSN, AGACNP-BC [facility] See Amion for schedule and pager information 01/03/2025 12:49 PM  ATTENDING NOTE: I reviewed above note and agree with the assessment and plan. Roy [name] was seen and examined.   Two family members are at the bedside. Roy [name] lying in bed, neuro stable no changes. Pending SNF.  On ASA and statin.   For detailed assessment and plan, please refer to above as I have made changes wherever appropriate.   Ary Cummins, MD PhD Stroke Neurology 01/03/2025 5:16 PM    To contact Stroke Continuity provider, please refer to [email or web address]. After hours, contact General Neurology

## 2025-01-03 NOTE — Plan of Care (Signed)
" °  Problem: Education: Goal: Knowledge of disease or condition will improve Outcome: Progressing   Problem: Health Behavior/Discharge Planning: Goal: Ability to manage health-related needs will improve Outcome: Progressing   Problem: Self-Care: Goal: Ability to participate in self-care as condition permits will improve Outcome: Progressing Goal: Ability to communicate needs accurately will improve Outcome: Progressing   Problem: Nutrition: Goal: Risk of aspiration will decrease Outcome: Progressing Goal: Dietary intake will improve Outcome: Progressing   Problem: Bowel/Gastric: Goal: Gastrointestinal status for postoperative course will improve Outcome: Progressing   Problem: Cardiac: Goal: Ability to maintain an adequate cardiac output Outcome: Progressing   Problem: Clinical Measurements: Goal: Ability to maintain clinical measurements within normal limits Outcome: Progressing Goal: Postoperative complications will be avoided or minimized Outcome: Progressing   Problem: Respiratory: Goal: Will regain and/or maintain adequate ventilation Outcome: Progressing Goal: Respiratory status will improve Outcome: Progressing   "

## 2025-01-04 DIAGNOSIS — I779 Disorder of arteries and arterioles, unspecified: Secondary | ICD-10-CM | POA: Diagnosis not present

## 2025-01-04 DIAGNOSIS — R233 Spontaneous ecchymoses: Secondary | ICD-10-CM | POA: Diagnosis not present

## 2025-01-04 DIAGNOSIS — R29715 NIHSS score 15: Secondary | ICD-10-CM | POA: Diagnosis not present

## 2025-01-04 DIAGNOSIS — I63232 Cerebral infarction due to unspecified occlusion or stenosis of left carotid arteries: Secondary | ICD-10-CM | POA: Diagnosis not present

## 2025-01-04 LAB — CBC
HCT: 33 % — ABNORMAL LOW (ref 39.0–52.0)
Hemoglobin: 11.5 g/dL — ABNORMAL LOW (ref 13.0–17.0)
MCH: 34.2 pg — ABNORMAL HIGH (ref 26.0–34.0)
MCHC: 34.8 g/dL (ref 30.0–36.0)
MCV: 98.2 fL (ref 80.0–100.0)
Platelets: 174 K/uL (ref 150–400)
RBC: 3.36 MIL/uL — ABNORMAL LOW (ref 4.22–5.81)
RDW: 12.4 % (ref 11.5–15.5)
WBC: 5.3 K/uL (ref 4.0–10.5)
nRBC: 0 % (ref 0.0–0.2)

## 2025-01-04 LAB — BASIC METABOLIC PANEL WITH GFR
Anion gap: 9 (ref 5–15)
BUN: 17 mg/dL (ref 6–20)
CO2: 26 mmol/L (ref 22–32)
Calcium: 8.7 mg/dL — ABNORMAL LOW (ref 8.9–10.3)
Chloride: 102 mmol/L (ref 98–111)
Creatinine, Ser: 1.09 mg/dL (ref 0.61–1.24)
GFR, Estimated: >60 mL/min
Glucose, Bld: 91 mg/dL (ref 70–99)
Potassium: 4 mmol/L (ref 3.5–5.1)
Sodium: 137 mmol/L (ref 135–145)

## 2025-01-04 MED ORDER — BISACODYL 10 MG RE SUPP: 10.0000 mg | Freq: Every day | RECTAL | Status: AC | PRN

## 2025-01-04 MED ORDER — POLYETHYLENE GLYCOL 3350 17 G PO PACK
17.0000 g | PACK | Freq: Every day | ORAL | Status: AC
  Administered 2025-01-04 – 2025-01-20 (×11): 17 g via ORAL
  Filled 2025-01-04 (×13): qty 1

## 2025-01-04 NOTE — Plan of Care (Signed)
 Calm, observed enjoying watching TV. Educated on importance of SCDs use.   Problem: Ischemic Stroke/TIA Tissue Perfusion: Goal: Complications of ischemic stroke/TIA will be minimized Outcome: Progressing   Problem: Education: Goal: Knowledge of secondary prevention will improve (MUST DOCUMENT ALL) Outcome: Progressing   Problem: Nutrition: Goal: Risk of aspiration will decrease Outcome: Progressing    Problem: Education: Goal: Understanding of CV disease, CV risk reduction, and recovery process will improve Outcome: Progressing   Problem: Safety: Goal: Ability to remain free from injury will improve Outcome: Progressing   Problem: Elimination: Goal: Will not experience complications related to bowel motility Outcome: Progressing

## 2025-01-04 NOTE — Progress Notes (Addendum)
 STROKE TEAM PROGRESS NOTE    SIGNIFICANT HOSPITAL EVENTS 1/11: Patient admitted with aphasia and right-sided weakness, given TNK and mechanical thrombectomy performed.  INTERIM HISTORY/SUBJECTIVE No family at the bedside. Patient is awake and alert, sitting up in bed. No new neurological events overnight. Neurological exam remains stable and unchanged  Labs and vitals remain stable. Trying to find an available bed for SNF, so far no bed offers   CBC    Component Value Date/Time   WBC 5.3 01/04/2025 0214   RBC 3.36 (L) 01/04/2025 0214   HGB 11.5 (L) 01/04/2025 0214   HGB 14.4 01/14/2024 1043   HCT 33.0 (L) 01/04/2025 0214   HCT 42.6 01/14/2024 1043   PLT 174 01/04/2025 0214   PLT 220 01/14/2024 1043   MCV 98.2 01/04/2025 0214   MCV 101 (H) 01/14/2024 1043   MCV 99 01/06/2015 1505   MCH 34.2 (H) 01/04/2025 0214   MCHC 34.8 01/04/2025 0214   RDW 12.4 01/04/2025 0214   RDW 11.9 01/14/2024 1043   RDW 13.0 01/06/2015 1505   LYMPHSABS 0.9 12/27/2024 0705   LYMPHSABS 1.6 01/14/2024 1043   LYMPHSABS 2.6 01/06/2015 1505   MONOABS 0.4 12/27/2024 0705   MONOABS 0.3 01/06/2015 1505   EOSABS 0.0 12/27/2024 0705   EOSABS 0.1 01/14/2024 1043   EOSABS 0.1 01/06/2015 1505   BASOSABS 0.0 12/27/2024 0705   BASOSABS 0.0 01/14/2024 1043   BASOSABS 0.0 01/06/2015 1505    BMET    Component Value Date/Time   NA 137 01/04/2025 0214   NA 141 01/06/2015 1505   K 4.0 01/04/2025 0214   K 3.9 01/06/2015 1505   CL 102 01/04/2025 0214   CL 107 01/06/2015 1505   CO2 26 01/04/2025 0214   CO2 29 01/06/2015 1505   GLUCOSE 91 01/04/2025 0214   GLUCOSE 111 (H) 01/06/2015 1505   BUN 17 01/04/2025 0214   BUN 16 01/06/2015 1505   CREATININE 1.09 01/04/2025 0214   CREATININE 1.28 01/06/2015 1505   CALCIUM  8.7 (L) 01/04/2025 0214   CALCIUM  9.0 01/06/2015 1505   GFRNONAA >60 01/04/2025 0214   GFRNONAA >60 01/06/2015 1505   GFRNONAA >60 05/10/2013 0535    IMAGING past 24 hours No results  found.   Vitals:   01/03/25 1630 01/03/25 2126 01/04/25 0027 01/04/25 0341  BP: 122/76 (!) 145/74 (!) 143/74 118/70  Pulse: (!) 42 (!) 41 (!) 41 (!) 42  Resp: 18 18 18 18   Temp: (!) 97.5 F (36.4 C) 98.7 F (37.1 C) 98.8 F (37.1 C) 98.4 F (36.9 C)  TempSrc: Oral Oral Oral Oral  SpO2: 98% 100% 98% 98%  Weight:      Height:         PHYSICAL EXAM General:  Alert, well-nourished, well-developed patient in no acute distress Psych:  Mood and affect appropriate for situation CV: Regular rate and rhythm on monitor Respiratory:  Regular, unlabored respirations on room air   NEURO:  Mental Status: Patient remains globally aphasic but responds to name and intermittently follows simple commands.  He will mimic Speech/Language: No verbal output, some vocalizations but no recognizable words  Cranial Nerves:  II: PERRL.  III, IV, VI: Left gaze deviation, able to go to midline VII: Face is symmetrical resting  VIII: hearing intact to voice. XII: tongue is midline without fasciculations. Motor: Able to move left upper and lower extremities with antigravity strength, able to move right upper extremity but cannot break gravity, moves right lower extremity but also does  not break gravity Tone: is normal and bulk is normal Sensation- Intact to light touch bilaterally.  Coordination: Unable to perform Gait- deferred  Most Recent NIH  1a Level of Conscious.: 0 1b LOC Questions: 2 1c LOC Commands: 1 2 Best Gaze: 1 3 Visual: 0 4 Facial Palsy: 0 5a Motor Arm - left: 0 5b Motor Arm - Right: 3 6a Motor Leg - Left: 0 6b Motor Leg - Right: 3 7 Limb Ataxia: 0 8 Sensory: 0 9 Best Language: 3 10 Dysarthria: 2 11 Extinct. and Inatten.: 0 TOTAL: 15   ASSESSMENT/PLAN  Mr. Gauge Winski is a 59 y.o. male with history of HIV, hypertension, substance abuse and alcohol abuse admitted for acute onset right-sided weakness and aphasia.  He was given TNK to treat stroke and was found to  have occlusion of left ICA/MCA, and mechanical thrombectomy was successfully performed.  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 s/p TNK and IR with TICI 3, etiology: Likely large vessel occlusion in the setting of multiple risk factors Code Stroke CT head hyperdense left MCA ASPECTS 7 CTA head & neck left ICA occlusion in the neck, left MCA origin occluded MRI acute left MCA territory infarct with confluent petechial hemorrhage, cytotoxic edema and mild regional mass effect with trace rightward midline shift 2D Echo EF 50 to 55% Will need 30-day monitor at discharge LDL 55 HgbA1c 5.8 UDS positive for cocaine, benzo and fentanyl  VTE prophylaxis - heparin  subcu No antithrombotic prior to admission, now on aspirin  81 mg daily due to petechial hemorrhage Therapy recommendations:  CIR Disposition: Pending  Hypertension Home meds: Losartan  100 mg daily Stable Long-term BP goal normotensive  Hyperlipidemia Home meds: Rosuvastatin  20 mg daily, resumed in hospital LDL 55, goal < 70 Continue statin at discharge  Tobacco Abuse Patient smokes 0.25 packs per day for 40 years      Ready to quit? N/A Nicotine  replacement therapy provided  Cocaine abuse Patient uses cocaine UDS positive for Cocaine      Ready to quit? N/A TOC consult for cessation placed  Dysphagia Patient has post-stroke dysphagia, SLP consulted Now on dysphagia 1 and thin liquid Aspiration precautions Advance diet as tolerated  Other Stroke Risk Factors Alcohol abuse HIV, on HARRT therapy  Other Active Problems Mild thrombocytopenia, platelet 137--153  Hospital day # 8  Patient seen and examined by NP/APP with MD. MD to update note as needed.    Karna Geralds DNP, ACNPC-AG  [facility]  ATTENDING NOTE: I reviewed above note and agree with the assessment and plan. Pt was seen and examined.   No family at the bedside. Pt lying in bed,  still has aphasia and R facial droop and R hemiparesis. Pending CIR vs. SNF. On ASA and statin.   For detailed assessment and plan, please refer to above as I have made changes wherever appropriate.   Ary Cummins, MD PhD Stroke Neurology 01/04/2025 5:32 PM    To contact Stroke Continuity provider, please refer to [email or web address]. After hours, contact General Neurology

## 2025-01-04 NOTE — Progress Notes (Signed)
 SCDs applied as ordered but patient repeatedly removed SCDs despite education provided.

## 2025-01-04 NOTE — Progress Notes (Signed)
 Occupational Therapy Treatment Patient Details Name: Roy [name] MRN: [id number] DOB: [birth year] Today's Date: 01/04/2025   History of present illness 59 yo M adm 12/27/24 from [facility] rehab center after fall with Rt weakness, AMS. Pt with Lt ICA/MCA CVA s/p Lt ICA/MCA thrombectomy complicated by Rt CFA occlusion from closure device requiring exploration and endarterectomy with angioplasty. Negative pressure dressing Rt groin  PMHx: cocaine & ETOH abuse, HIV, bipolar disorder, CAD   OT comments  Pt making excellent progress towards goals. Pt able to progress gait trials with introduction of hemiwalker with Mod A x 2 for safety. Pt requiring support of hemiparietic RUE and assist to advance RLE d/t decreased toe clearance though muscle activation noted throughout RLE. Educated on strategies for UB dressing with pt return demonstrating with Min A. Emphasized importance of RUE positioning and protection of this UE to avoid injury. Pt with excellent rehab potential. Continue to recommend intensive rehab services >3 hours per day upon DC.      If plan is discharge home, recommend the following:  A lot of help with walking and/or transfers;A lot of help with bathing/dressing/bathroom;Assistance with cooking/housework;Direct supervision/assist for medications management;Direct supervision/assist for financial management;Assist for transportation;Help with stairs or ramp for entrance   Equipment Recommendations  Other (comment) (TBD)    Recommendations for Other Services Rehab consult    Precautions / Restrictions Precautions Precautions: Fall;Other (comment) Recall of Precautions/Restrictions: Impaired Precaution/Restrictions Comments: foley, aphasia, Rt hemiparesis Restrictions Weight Bearing Restrictions Per Provider Order: No       Mobility Bed Mobility Overal bed mobility: Needs Assistance Bed Mobility: Supine to Sit, Sit to Supine     Supine to sit: Contact guard Sit to supine:  Contact guard assist   General bed mobility comments: CGA for safety. No physical assistance required. Cued for hooking LLE to RLE to assist back to bed.    Transfers Overall transfer level: Needs assistance Equipment used: 2 person hand held assist, Hemi-walker Transfers: Sit to/from Stand Sit to Stand: Min assist           General transfer comment: Min A to stand with handheld assist and then trialed with hemiwalker.  Light assist to correct initial imbalance with transitional movement     Balance Overall balance assessment: Needs assistance Sitting-balance support: Feet supported Sitting balance-Leahy Scale: Fair     Standing balance support: No upper extremity supported Standing balance-Leahy Scale: Fair                             ADL either performed or assessed with clinical judgement   ADL Overall ADL's : Needs assistance/impaired                 Upper Body Dressing : Sitting;Minimal assistance Upper Body Dressing Details (indicate cue type and reason): cued to doff gown around back, eduacted to start with stronger UE to doff UB clothing with pt able to return demo well                        Extremity/Trunk Assessment Upper Extremity Assessment Upper Extremity Assessment: Right hand dominant;RUE deficits/detail RUE Deficits / Details: flaccid, PROM WFL RUE Coordination: decreased gross motor;decreased fine motor   Lower Extremity Assessment Lower Extremity Assessment: Defer to PT evaluation        Vision   Vision Assessment?: Vision impaired- to be further tested in functional context Additional Comments: mild R inattention but appears  functional   Perception     Praxis     Communication Communication Communication: Impaired Factors Affecting Communication: Difficulty expressing self   Cognition Arousal: Alert Behavior During Therapy: WFL for tasks assessed/performed Cognition: Difficult to assess Difficult to assess  due to: Impaired communication           OT - Cognition Comments: able to follow simple commands and answer yes/no questions                 Following commands: Impaired Following commands impaired: Follows one step commands with increased time, Only follows one step commands consistently      Cueing   Cueing Techniques: Verbal cues, Gestural cues, Tactile cues  Exercises      Shoulder Instructions       General Comments      Pertinent Vitals/ Pain       Pain Assessment Pain Assessment: Faces Faces Pain Scale: No hurt Pain Intervention(s): Monitored during session  Home Living                                          Prior Functioning/Environment              Frequency  Min 2X/week        Progress Toward Goals  OT Goals(current goals can now be found in the care plan section)  Progress towards OT goals: Progressing toward goals     Plan      Co-evaluation    PT/OT/SLP Co-Evaluation/Treatment: Yes Reason for Co-Treatment: Complexity of the patient's impairments (multi-system involvement);For patient/therapist safety PT goals addressed during session: Mobility/safety with mobility;Balance;Proper use of DME OT goals addressed during session: ADL's and self-care;Strengthening/ROM;Proper use of Adaptive equipment and DME      AM-PAC OT 6 Clicks Daily Activity     Outcome Measure   Help from another person eating meals?: A Little Help from another person taking care of personal grooming?: A Little Help from another person toileting, which includes using toliet, bedpan, or urinal?: A Lot Help from another person bathing (including washing, rinsing, drying)?: A Lot Help from another person to put on and taking off regular upper body clothing?: A Lot Help from another person to put on and taking off regular lower body clothing?: A Lot 6 Click Score: 14    End of Session Equipment Utilized During Treatment: Gait belt;Other  (comment) (hemiwalker)  OT Visit Diagnosis: Unsteadiness on feet (R26.81);Other abnormalities of gait and mobility (R26.89);Muscle weakness (generalized) (M62.81);Hemiplegia and hemiparesis;Cognitive communication deficit (R41.841) Symptoms and signs involving cognitive functions: Cerebral infarction Hemiplegia - Right/Left: Right Hemiplegia - dominant/non-dominant: Dominant Hemiplegia - caused by: Cerebral infarction   Activity Tolerance Patient tolerated treatment well   Patient Left in bed;with bed alarm set;with call bell/phone within reach   Nurse Communication          Time: 9076-9049 OT Time Calculation (min): 27 min  Charges: OT General Charges $OT Visit: 1 Visit OT Treatments $Self Care/Home Management : 8-22 mins  Mliss NOVAK, OTR/L Acute Rehab Services Office: [phone]   Mliss Fish 01/04/2025, 11:48 AM

## 2025-01-04 NOTE — TOC Progression Note (Signed)
 Transition of Care [facility]) - Progression Note    Patient Details  Name: Roy [name] MRN: [id number] Date of Birth: 11-03-1966  Transition of Care [facility]) CM/SW Contact  Almarie CHRISTELLA Goodie, [place] Phone Number: 01/04/2025, 10:31 AM  Clinical Narrative:   Patient continues with no bed offers for SNF. Barriers discussed with MD. CSW to follow.    Expected Discharge Plan: Skilled Nursing Facility Barriers to Discharge: Insurance Authorization, Inadequate or no insurance, Active Substance Use - Placement, SNF Pending bed offer               Expected Discharge Plan and Services                                               Social Drivers of Health (SDOH) Interventions SDOH Screenings   Food Insecurity: No Food Insecurity (06/03/2024)  Housing: Patient Declined (05/04/2024)  Transportation Needs: Unmet Transportation Needs (06/03/2024)  Utilities: At Risk (06/03/2024)  Depression (PHQ2-9): Low Risk (06/08/2024)  Recent Concern: Depression (PHQ2-9) - High Risk (06/07/2024)  Financial Resource Strain: Medium Risk (01/20/2024)   Received from [facility]  Tobacco Use: High Risk (12/31/2024)  Health Literacy: Low Risk  (07/02/2022)   Received from [facility]    Readmission Risk Interventions     No data to display

## 2025-01-04 NOTE — Progress Notes (Signed)
 IP rehab admissions - I called sister and she has not been able to come up with caregiver coverage or a viable DC plan for patient.  Says she is planning on patient going to SNF if a bed becomes available.  [phone]

## 2025-01-04 NOTE — Progress Notes (Signed)
 Physical Therapy Treatment Patient Details Name: Roy [name] MRN: [id number] DOB: October 04, 1966 Today's Date: 01/04/2025   History of Present Illness 59 yo M adm 12/27/24 from ETOH rehab center after fall with Rt weakness, AMS. Pt with Lt ICA/MCA CVA s/p Lt ICA/MCA thrombectomy complicated by Rt CFA occlusion from closure device requiring exploration and endarterectomy with angioplasty. Negative pressure dressing Rt groin  PMHx: cocaine & ETOH abuse, HIV, bipolar disorder, CAD    PT Comments  Pt tolerated treatment well today. Co-treat with OT. Pt today was able to progress ambulation with hemiwalker in room with +2 Mod A. Pt noted with increased quad activation today. Plan to trial gait training in hallway tomorrow using railing and chair follow. Pt still remains an excellent candidate for AIR. PT will continue to follow.    If plan is discharge home, recommend the following: Two people to help with walking and/or transfers;A lot of help with bathing/dressing/bathroom;Assistance with feeding;Assistance with cooking/housework;Direct supervision/assist for financial management;Assist for transportation;Direct supervision/assist for medications management;Supervision due to cognitive status   Can travel by private vehicle        Equipment Recommendations  BSC/3in1;Wheelchair (measurements PT);Wheelchair cushion (measurements PT)    Recommendations for Other Services       Precautions / Restrictions Precautions Precautions: Fall;Other (comment) Recall of Precautions/Restrictions: Impaired Precaution/Restrictions Comments: foley, aphasia, Rt hemiparesis Restrictions Weight Bearing Restrictions Per Provider Order: No     Mobility  Bed Mobility Overal bed mobility: Needs Assistance Bed Mobility: Supine to Sit, Sit to Supine     Supine to sit: Contact guard Sit to supine: Contact guard assist   General bed mobility comments: CGA for safety. No physical assistance required. Cued  for hooking LLE to RLE to assist back to bed.    Transfers Overall transfer level: Needs assistance Equipment used: 2 person hand held assist, Hemi-walker Transfers: Sit to/from Stand Sit to Stand: Min assist           General transfer comment: Min A to stand with 2 person HHA and hemiwalker. L arm supported by therapist.    Ambulation/Gait Ambulation/Gait assistance: Mod assist, +2 safety/equipment, +2 physical assistance Gait Distance (Feet): 20 Feet Assistive device: 2 person hand held assist, Hemi-walker Gait Pattern/deviations: Step-to pattern, Decreased step length - right, Decreased dorsiflexion - right, Decreased weight shift to right, Knee hyperextension - right Gait velocity: decreased Gait velocity interpretation: <1.31 ft/sec, indicative of household ambulator   General Gait Details: 1st trial with HHA to window with +2 Mod A. 2nd trial with hemiwalker with pt able to increase distance with cues for sequecing. Pt still noted with R knee hyperextension however no buckling noted.  Mod/Max A to advance RLE.   Stairs             Wheelchair Mobility     Tilt Bed    Modified Rankin (Stroke Patients Only) Modified Rankin (Stroke Patients Only) Pre-Morbid Rankin Score: No symptoms Modified Rankin: Moderately severe disability     Balance Overall balance assessment: Needs assistance Sitting-balance support: Feet supported Sitting balance-Leahy Scale: Fair     Standing balance support: No upper extremity supported Standing balance-Leahy Scale: Fair                              Musician Communication: Impaired Factors Affecting Communication: Difficulty expressing self  Cognition Arousal: Alert Behavior During Therapy: WFL for tasks assessed/performed   PT - Cognitive impairments: Difficult to assess Difficult  to assess due to: Impaired communication                     PT - Cognition Comments: able to nod  yes/no >75% of the time Following commands: Impaired Following commands impaired: Follows one step commands with increased time, Only follows one step commands consistently    Cueing Cueing Techniques: Verbal cues, Gestural cues, Tactile cues  Exercises General Exercises - Lower Extremity Ankle Circles/Pumps: AAROM, Right, 5 reps Long Arc Quad: AROM, Right, 10 reps, Seated (Noted with increased quad activation.)    General Comments General comments (skin integrity, edema, etc.): VSS. HR in the 40-50s      Pertinent Vitals/Pain Pain Assessment Pain Assessment: Faces Faces Pain Scale: No hurt Pain Intervention(s): Monitored during session    Home Living                          Prior Function            PT Goals (current goals can now be found in the care plan section) Progress towards PT goals: Progressing toward goals    Frequency    Min 3X/week      PT Plan      Co-evaluation PT/OT/SLP Co-Evaluation/Treatment: Yes Reason for Co-Treatment: Complexity of the patient's impairments (multi-system involvement);For patient/therapist safety PT goals addressed during session: Mobility/safety with mobility;Balance;Proper use of DME OT goals addressed during session: ADL's and self-care;Strengthening/ROM;Proper use of Adaptive equipment and DME      AM-PAC PT 6 Clicks Mobility   Outcome Measure  Help needed turning from your back to your side while in a flat bed without using bedrails?: A Little Help needed moving from lying on your back to sitting on the side of a flat bed without using bedrails?: A Lot Help needed moving to and from a bed to a chair (including a wheelchair)?: A Lot Help needed standing up from a chair using your arms (e.g., wheelchair or bedside chair)?: A Little Help needed to walk in hospital room?: A Lot Help needed climbing 3-5 steps with a railing? : Total 6 Click Score: 13    End of Session Equipment Utilized During Treatment:  Gait belt Activity Tolerance: Patient tolerated treatment well Patient left: in bed;with call bell/phone within reach (Declined chair) Nurse Communication: Mobility status PT Visit Diagnosis: Other abnormalities of gait and mobility (R26.89);Difficulty in walking, not elsewhere classified (R26.2);Hemiplegia and hemiparesis;Other symptoms and signs involving the nervous system (R29.898) Hemiplegia - Right/Left: Right Hemiplegia - dominant/non-dominant: Dominant Hemiplegia - caused by: Cerebral infarction     Time: 9076-9047 PT Time Calculation (min) (ACUTE ONLY): 29 min  Charges:    $Gait Training: 8-22 mins PT General Charges $$ ACUTE PT VISIT: 1 Visit                     Sueellen NOVAK, PT, DPT Acute Rehab Services [phone]    Jessi Pitstick 01/04/2025, 12:23 PM

## 2025-01-04 NOTE — Progress Notes (Addendum)
 Speech Language Pathology Treatment: Dysphagia;Cognitive-Linguistic  Patient Details Name: Roy [name] MRN: [id number] DOB: [birth year] Today's Date: 01/04/2025 Time: 8444-8384 SLP Time Calculation (min) (ACUTE ONLY): 20 min  Assessment / Plan / Recommendation Clinical Impression  Cognitive-Linguistic: Pt continues to exhibit expressive and receptive language deficits. His ability to answer yes/no questions is inconsistent. He can intermittently follow one step commands but has increased difficulty with more complexity. He uses gestures spontaneously but this does not carry over into automatic speech tasks (greetings, counting, days of week). Education and counseling provided to pt and his sister.   Dysphagia: Pt demonstrates improved awareness of R sided residue with visual cueing and use of a mirror. He feeds himself and can use a lingual/digital sweep as needed. With simulated Dys 2 and Dys 3 solids, he is unable to effectively mash/masticate them and has to expectorate the bolus entirely. [name] sister states that he did not normally wear his dentures PTA but that she can try to bring them in. No s/s of aspiration were observed throughout session with thin liquids or mixed consistency solids. Continue current diet with ongoing SLP f/u, >3 hrs/day.    HPI HPI: 59 yo male presenting to ED 1/11 from EtOH rehab center with R sided weakness and AMS s/p fall. CTA shows proximal L MCA and ICA occlusion s/p TNK and thrombectomy. Complicated by R CFA occlusion from closure device requiring exploration and endarterectomy with angioplasty. MRI shows acute L MCA territory infarct, confluent at the L basal ganglia, insula, and operculum as well as ongoing abnormal L ICA flow below the skull base. MBS 1/12 with primary oral dysphagia and only trace, transient penetration of thin liquids. PMH includes HIV, history of hepatitis C, bipolar disorder, cocaine and EtOH abuse      SLP Plan  Continue with current  plan of care        Swallow Evaluation Recommendations   Recommendations: PO diet PO Diet Recommendation: Dysphagia 1 (Pureed);Thin liquids (Level 0) Liquid Administration via: Cup;Straw Medication Administration: Whole meds with puree Supervision: Staff to assist with self-feeding;Full supervision/cueing for swallowing strategies Postural changes: Position pt fully upright for meals Oral care recommendations: Oral care BID (2x/day)     Recommendations                     Oral care BID   Frequent or constant Supervision/Assistance Dysphagia, oropharyngeal phase (R13.12);Aphasia (R47.01)     Continue with current plan of care     Damien Blumenthal, M.A., CCC-SLP Speech Language Pathology, Acute Rehabilitation Services  Secure Chat preferred [phone]   01/04/2025, 4:41 PM

## 2025-01-05 DIAGNOSIS — F1410 Cocaine abuse, uncomplicated: Secondary | ICD-10-CM | POA: Diagnosis not present

## 2025-01-05 DIAGNOSIS — I63232 Cerebral infarction due to unspecified occlusion or stenosis of left carotid arteries: Secondary | ICD-10-CM | POA: Diagnosis not present

## 2025-01-05 DIAGNOSIS — B20 Human immunodeficiency virus [HIV] disease: Secondary | ICD-10-CM | POA: Diagnosis not present

## 2025-01-05 DIAGNOSIS — R233 Spontaneous ecchymoses: Secondary | ICD-10-CM | POA: Diagnosis not present

## 2025-01-05 DIAGNOSIS — E785 Hyperlipidemia, unspecified: Secondary | ICD-10-CM | POA: Diagnosis not present

## 2025-01-05 DIAGNOSIS — I10 Essential (primary) hypertension: Secondary | ICD-10-CM | POA: Diagnosis not present

## 2025-01-05 DIAGNOSIS — R29715 NIHSS score 15: Secondary | ICD-10-CM | POA: Diagnosis not present

## 2025-01-05 DIAGNOSIS — F1010 Alcohol abuse, uncomplicated: Secondary | ICD-10-CM | POA: Diagnosis not present

## 2025-01-05 DIAGNOSIS — I69391 Dysphagia following cerebral infarction: Secondary | ICD-10-CM | POA: Diagnosis not present

## 2025-01-05 DIAGNOSIS — F17210 Nicotine dependence, cigarettes, uncomplicated: Secondary | ICD-10-CM | POA: Diagnosis not present

## 2025-01-05 DIAGNOSIS — G936 Cerebral edema: Secondary | ICD-10-CM | POA: Diagnosis not present

## 2025-01-05 LAB — BASIC METABOLIC PANEL WITH GFR
Anion gap: 9 (ref 5–15)
BUN: 20 mg/dL (ref 6–20)
CO2: 26 mmol/L (ref 22–32)
Calcium: 8.8 mg/dL — ABNORMAL LOW (ref 8.9–10.3)
Chloride: 102 mmol/L (ref 98–111)
Creatinine, Ser: 1.21 mg/dL (ref 0.61–1.24)
GFR, Estimated: >60 mL/min
Glucose, Bld: 92 mg/dL (ref 70–99)
Potassium: 4.4 mmol/L (ref 3.5–5.1)
Sodium: 137 mmol/L (ref 135–145)

## 2025-01-05 LAB — CBC
HCT: 33.7 % — ABNORMAL LOW (ref 39.0–52.0)
Hemoglobin: 11.6 g/dL — ABNORMAL LOW (ref 13.0–17.0)
MCH: 33.8 pg (ref 26.0–34.0)
MCHC: 34.4 g/dL (ref 30.0–36.0)
MCV: 98.3 fL (ref 80.0–100.0)
Platelets: 181 K/uL (ref 150–400)
RBC: 3.43 MIL/uL — ABNORMAL LOW (ref 4.22–5.81)
RDW: 12.6 % (ref 11.5–15.5)
WBC: 5.8 K/uL (ref 4.0–10.5)
nRBC: 0 % (ref 0.0–0.2)

## 2025-01-05 NOTE — Progress Notes (Signed)
 Inpatient Rehab Admissions Coordinator:    [facility] working on SNF. CIR will sign off.   Leita Kleine, MS, CCC-SLP Rehab Admissions Coordinator  [phone] (celll) [phone] (office)

## 2025-01-05 NOTE — Progress Notes (Signed)
 Physical Therapy Treatment Patient Details Name: Roy [name] MRN: [id number] DOB: Sep 28, 1966 Today's Date: 01/05/2025   History of Present Illness 59 yo M adm 12/27/24 from ETOH rehab center after fall with Rt weakness, AMS. Pt with Lt ICA/MCA CVA s/p Lt ICA/MCA thrombectomy complicated by Rt CFA occlusion from closure device requiring exploration and endarterectomy with angioplasty. Negative pressure dressing Rt groin  PMHx: cocaine & ETOH abuse, HIV, bipolar disorder, CAD    PT Comments  Pt tolerated treatment well today. Session today focused on gait training. Pt able to complete 3 trials with hallway rail, quad cane, and hemiwalker with close chair follow. Mod A for each trial with pt requiring up to Mod/Max A to advance RLE. No change in DC/DME recs at this time. PT will continue to follow.     If plan is discharge home, recommend the following: Two people to help with walking and/or transfers;A lot of help with bathing/dressing/bathroom;Assistance with feeding;Assistance with cooking/housework;Direct supervision/assist for financial management;Assist for transportation;Direct supervision/assist for medications management;Supervision due to cognitive status   Can travel by private vehicle        Equipment Recommendations  BSC/3in1;Wheelchair (measurements PT);Wheelchair cushion (measurements PT)    Recommendations for Other Services Rehab consult     Precautions / Restrictions Precautions Precautions: Fall;Other (comment) Recall of Precautions/Restrictions: Impaired Precaution/Restrictions Comments: foley, aphasia, Rt hemiparesis Restrictions Weight Bearing Restrictions Per Provider Order: No     Mobility  Bed Mobility Overal bed mobility: Needs Assistance Bed Mobility: Supine to Sit     Supine to sit: Min assist     General bed mobility comments: Min A to scoot hips forward.    Transfers Overall transfer level: Needs assistance Equipment used: 2 person hand  held assist, Quad cane, Hemi-walker Transfers: Sit to/from Stand Sit to Stand: Min assist, +2 safety/equipment           General transfer comment: Min A to stand with 2 person HHA, quad cane, and hemiwalker. L arm supported by therapist.    Ambulation/Gait Ambulation/Gait assistance: Mod assist, +2 safety/equipment, +2 physical assistance (Close chair follow) Gait Distance (Feet): 45 Feet (15+15+15) Assistive device: 1 person hand held assist, Quad cane, Hemi-walker (Railing) Gait Pattern/deviations: Step-to pattern, Decreased step length - right, Decreased dorsiflexion - right, Decreased weight shift to right, Knee hyperextension - right Gait velocity: decreased Gait velocity interpretation: <1.31 ft/sec, indicative of household ambulator   General Gait Details: 3 trials of gait today between hallway railing, quad cane, and hemiwalker. Close chair follow provided. Each trial pt noted with difficulty advancing RLE with heavy anterior lean. Cued for upright posture and sequencing with Mod/Max A to advance.   Stairs             Wheelchair Mobility     Tilt Bed    Modified Rankin (Stroke Patients Only) Modified Rankin (Stroke Patients Only) Pre-Morbid Rankin Score: No symptoms Modified Rankin: Moderately severe disability     Balance Overall balance assessment: Needs assistance Sitting-balance support: Feet supported Sitting balance-Leahy Scale: Fair Sitting balance - Comments: supervision for safety, no trunk lean noted   Standing balance support: No upper extremity supported Standing balance-Leahy Scale: Fair Standing balance comment: Fair-, patient able to participate in unsupported static standing for 30-45 seconds each attempt. No support on back of legs.                            Communication Communication Communication: Impaired Factors Affecting Communication: Difficulty  expressing self  Cognition Arousal: Alert Behavior During Therapy: WFL  for tasks assessed/performed   PT - Cognitive impairments: Difficult to assess Difficult to assess due to: Impaired communication                     PT - Cognition Comments: able to nod yes/no >75% of the time Following commands: Impaired Following commands impaired: Follows one step commands with increased time, Only follows one step commands consistently    Cueing Cueing Techniques: Verbal cues, Gestural cues, Tactile cues  Exercises      General Comments General comments (skin integrity, edema, etc.): VSS      Pertinent Vitals/Pain Pain Assessment Pain Assessment: Faces Faces Pain Scale: No hurt Pain Intervention(s): Monitored during session    Home Living                          Prior Function            PT Goals (current goals can now be found in the care plan section) Progress towards PT goals: Progressing toward goals    Frequency    Min 3X/week      PT Plan      Co-evaluation              AM-PAC PT 6 Clicks Mobility   Outcome Measure  Help needed turning from your back to your side while in a flat bed without using bedrails?: A Little Help needed moving from lying on your back to sitting on the side of a flat bed without using bedrails?: A Lot Help needed moving to and from a bed to a chair (including a wheelchair)?: A Lot Help needed standing up from a chair using your arms (e.g., wheelchair or bedside chair)?: A Little Help needed to walk in hospital room?: A Lot Help needed climbing 3-5 steps with a railing? : Total 6 Click Score: 13    End of Session Equipment Utilized During Treatment: Gait belt Activity Tolerance: Patient tolerated treatment well Patient left: in chair;with call bell/phone within reach;with chair alarm set Nurse Communication: Mobility status PT Visit Diagnosis: Other abnormalities of gait and mobility (R26.89);Difficulty in walking, not elsewhere classified (R26.2);Hemiplegia and  hemiparesis;Other symptoms and signs involving the nervous system (R29.898) Hemiplegia - Right/Left: Right Hemiplegia - dominant/non-dominant: Dominant Hemiplegia - caused by: Cerebral infarction     Time: 8540-8472 PT Time Calculation (min) (ACUTE ONLY): 28 min  Charges:    $Gait Training: 23-37 mins PT General Charges $$ ACUTE PT VISIT: 1 Visit                     Roy [name] B, PT, DPT Acute Rehab Services [phone]    Daishia Fetterly 01/05/2025, 4:00 PM

## 2025-01-05 NOTE — Progress Notes (Addendum)
 STROKE TEAM PROGRESS NOTE    SIGNIFICANT HOSPITAL EVENTS 1/11: Patient admitted with aphasia and right-sided weakness, given TNK and mechanical thrombectomy performed.  INTERIM HISTORY/SUBJECTIVE No family at the bedside.  No new neurological events overnight.  Neurological exam remains stable and unchanged.  Patient stable waiting for SNF as of date we have no offers  CBC    Component Value Date/Time   WBC 5.8 01/05/2025 0432   RBC 3.43 (L) 01/05/2025 0432   HGB 11.6 (L) 01/05/2025 0432   HGB 14.4 01/14/2024 1043   HCT 33.7 (L) 01/05/2025 0432   HCT 42.6 01/14/2024 1043   PLT 181 01/05/2025 0432   PLT 220 01/14/2024 1043   MCV 98.3 01/05/2025 0432   MCV 101 (H) 01/14/2024 1043   MCV 99 01/06/2015 1505   MCH 33.8 01/05/2025 0432   MCHC 34.4 01/05/2025 0432   RDW 12.6 01/05/2025 0432   RDW 11.9 01/14/2024 1043   RDW 13.0 01/06/2015 1505   LYMPHSABS 0.9 12/27/2024 0705   LYMPHSABS 1.6 01/14/2024 1043   LYMPHSABS 2.6 01/06/2015 1505   MONOABS 0.4 12/27/2024 0705   MONOABS 0.3 01/06/2015 1505   EOSABS 0.0 12/27/2024 0705   EOSABS 0.1 01/14/2024 1043   EOSABS 0.1 01/06/2015 1505   BASOSABS 0.0 12/27/2024 0705   BASOSABS 0.0 01/14/2024 1043   BASOSABS 0.0 01/06/2015 1505    BMET    Component Value Date/Time   NA 137 01/05/2025 0432   NA 141 01/06/2015 1505   K 4.4 01/05/2025 0432   K 3.9 01/06/2015 1505   CL 102 01/05/2025 0432   CL 107 01/06/2015 1505   CO2 26 01/05/2025 0432   CO2 29 01/06/2015 1505   GLUCOSE 92 01/05/2025 0432   GLUCOSE 111 (H) 01/06/2015 1505   BUN 20 01/05/2025 0432   BUN 16 01/06/2015 1505   CREATININE 1.21 01/05/2025 0432   CREATININE 1.28 01/06/2015 1505   CALCIUM  8.8 (L) 01/05/2025 0432   CALCIUM  9.0 01/06/2015 1505   GFRNONAA >60 01/05/2025 0432   GFRNONAA >60 01/06/2015 1505   GFRNONAA >60 05/10/2013 0535    IMAGING past 24 hours No results found.   Vitals:   01/05/25 0024 01/05/25 0503 01/05/25 0836 01/05/25 1142  BP:  113/65 129/73 121/74 108/66  Pulse: (!) 46 (!) 43 (!) 43 (!) 47  Resp: 17 17 18 18   Temp: 98.7 F (37.1 C) 98.6 F (37 C) 98.7 F (37.1 C) 98 F (36.7 C)  TempSrc: Oral Oral Oral Oral  SpO2: 100% 97% 99% 99%  Weight:      Height:         PHYSICAL EXAM General:  Alert, well-nourished, well-developed patient in no acute distress Psych:  Mood and affect appropriate for situation CV: Regular rate and rhythm on monitor Respiratory:  Regular, unlabored respirations on room air   NEURO:  Mental Status: Patient remains globally aphasic but responds to name and intermittently follows simple commands.  He will mimic Speech/Language: No verbal output, some vocalizations but no recognizable words  Cranial Nerves:  II: PERRL.  III, IV, VI: Left gaze deviation, able to go to midline VII: Face is symmetrical resting  VIII: hearing intact to voice. XII: tongue is midline without fasciculations. Motor: Able to move left upper and lower extremities with antigravity strength, able to move right upper extremity but cannot break gravity, moves right lower extremity but also does not break gravity Tone: is normal and bulk is normal Sensation- Intact to light touch bilaterally.  Coordination:  Unable to perform Gait- deferred  Most Recent NIH  1a Level of Conscious.: 0 1b LOC Questions: 2 1c LOC Commands: 1 2 Best Gaze: 1 3 Visual: 0 4 Facial Palsy: 0 5a Motor Arm - left: 0 5b Motor Arm - Right: 3 6a Motor Leg - Left: 0 6b Motor Leg - Right: 3 7 Limb Ataxia: 0 8 Sensory: 0 9 Best Language: 3 10 Dysarthria: 2 11 Extinct. and Inatten.: 0 TOTAL: 15   ASSESSMENT/PLAN  [name] is a 59 y.o. male with history of HIV, hypertension, substance abuse and alcohol abuse admitted for acute onset right-sided weakness and aphasia.  He was given TNK to treat stroke and was found to have occlusion of left ICA/MCA, and mechanical thrombectomy was successfully performed.  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 s/p TNK and IR with TICI 3, etiology: Likely large vessel occlusion in the setting of multiple risk factors Code Stroke CT head hyperdense left MCA ASPECTS 7 CTA head & neck left ICA occlusion in the neck, left MCA origin occluded MRI acute left MCA territory infarct with confluent petechial hemorrhage, cytotoxic edema and mild regional mass effect with trace rightward midline shift 2D Echo EF 50 to 55% Will need 30-day monitor at discharge LDL 55 HgbA1c 5.8 UDS positive for cocaine, benzo and fentanyl  VTE prophylaxis - heparin  subcu No antithrombotic prior to admission, now on aspirin  81 mg daily due to petechial hemorrhage Therapy recommendations:  CIR Disposition: Pending  Hypertension Home meds: Losartan  100 mg daily Stable Long-term BP goal normotensive  Hyperlipidemia Home meds: Rosuvastatin  20 mg daily, resumed in hospital LDL 55, goal < 70 Continue statin at discharge  Tobacco Abuse Patient smokes 0.25 packs per day for 40 years      Ready to quit? N/A Nicotine  replacement therapy provided  Cocaine abuse Patient uses cocaine UDS positive for Cocaine      Ready to quit? N/A TOC consult for cessation placed  Dysphagia Patient has post-stroke dysphagia, SLP consulted Now on dysphagia 1 and thin liquid Aspiration precautions Advance diet as tolerated  Other Stroke Risk Factors Alcohol abuse HIV, on HARRT therapy  Other Active Problems Mild thrombocytopenia, platelet 137--153  Hospital day # 9  Patient seen and examined by NP/APP with MD. MD to update note as needed.    Karna Geralds DNP, ACNPC-AG  [facility]  ATTENDING NOTE: I reviewed above note and agree with the assessment and plan. Pt was seen and examined.   No acute event overnight. Continue current management. Pending SNF.   For detailed assessment and plan, please refer to above as I have made  changes wherever appropriate.   Ary Cummins, MD PhD Stroke Neurology 01/05/2025 11:25 PM     To contact Stroke Continuity provider, please refer to [email or web address]. After hours, contact General Neurology

## 2025-01-05 NOTE — Progress Notes (Signed)
 PT Cancellation Note  Patient Details Name: Roy [name] MRN: [id number] DOB: 06-Mar-1966   Cancelled Treatment:    Reason Eval/Treat Not Completed: Patient declined, no reason specified (Pt declined shaking his no when entering. Pt pointing at his chest however shook his no when asked if he had chest pain. Pt shook his head yes when asked if he wanted more time to rest. Will follow up later if time allows.)   Teniyah Seivert 01/05/2025, 2:06 PM

## 2025-01-05 NOTE — Plan of Care (Signed)
   Problem: Activity: Goal: Risk for activity intolerance will decrease Outcome: Progressing

## 2025-01-06 DIAGNOSIS — B20 Human immunodeficiency virus [HIV] disease: Secondary | ICD-10-CM | POA: Diagnosis not present

## 2025-01-06 DIAGNOSIS — F1410 Cocaine abuse, uncomplicated: Secondary | ICD-10-CM | POA: Diagnosis not present

## 2025-01-06 DIAGNOSIS — I10 Essential (primary) hypertension: Secondary | ICD-10-CM | POA: Diagnosis not present

## 2025-01-06 DIAGNOSIS — I63232 Cerebral infarction due to unspecified occlusion or stenosis of left carotid arteries: Secondary | ICD-10-CM | POA: Diagnosis not present

## 2025-01-06 DIAGNOSIS — R233 Spontaneous ecchymoses: Secondary | ICD-10-CM | POA: Diagnosis not present

## 2025-01-06 DIAGNOSIS — I69391 Dysphagia following cerebral infarction: Secondary | ICD-10-CM | POA: Diagnosis not present

## 2025-01-06 DIAGNOSIS — F1010 Alcohol abuse, uncomplicated: Secondary | ICD-10-CM | POA: Diagnosis not present

## 2025-01-06 DIAGNOSIS — E785 Hyperlipidemia, unspecified: Secondary | ICD-10-CM | POA: Diagnosis not present

## 2025-01-06 DIAGNOSIS — F17210 Nicotine dependence, cigarettes, uncomplicated: Secondary | ICD-10-CM | POA: Diagnosis not present

## 2025-01-06 DIAGNOSIS — G936 Cerebral edema: Secondary | ICD-10-CM | POA: Diagnosis not present

## 2025-01-06 LAB — BASIC METABOLIC PANEL WITH GFR
Anion gap: 9 (ref 5–15)
BUN: 22 mg/dL — ABNORMAL HIGH (ref 6–20)
CO2: 26 mmol/L (ref 22–32)
Calcium: 8.9 mg/dL (ref 8.9–10.3)
Chloride: 100 mmol/L (ref 98–111)
Creatinine, Ser: 1.22 mg/dL (ref 0.61–1.24)
GFR, Estimated: >60 mL/min
Glucose, Bld: 91 mg/dL (ref 70–99)
Potassium: 4.4 mmol/L (ref 3.5–5.1)
Sodium: 135 mmol/L (ref 135–145)

## 2025-01-06 LAB — CBC
HCT: 34.6 % — ABNORMAL LOW (ref 39.0–52.0)
Hemoglobin: 11.9 g/dL — ABNORMAL LOW (ref 13.0–17.0)
MCH: 34.1 pg — ABNORMAL HIGH (ref 26.0–34.0)
MCHC: 34.4 g/dL (ref 30.0–36.0)
MCV: 99.1 fL (ref 80.0–100.0)
Platelets: 197 K/uL (ref 150–400)
RBC: 3.49 MIL/uL — ABNORMAL LOW (ref 4.22–5.81)
RDW: 12.6 % (ref 11.5–15.5)
WBC: 5.6 K/uL (ref 4.0–10.5)
nRBC: 0 % (ref 0.0–0.2)

## 2025-01-06 NOTE — Progress Notes (Addendum)
 B STROKE TEAM PROGRESS NOTE    SIGNIFICANT HOSPITAL EVENTS 1/11: Patient admitted with aphasia and right-sided weakness, given TNK and mechanical thrombectomy performed.  INTERIM HISTORY/SUBJECTIVE No family at the bedside.  No new neurological events overnight.  Neurological exam remains stable and unchanged still waiting on SNF availability bed  CBC    Component Value Date/Time   WBC 5.6 01/06/2025 0234   RBC 3.49 (L) 01/06/2025 0234   HGB 11.9 (L) 01/06/2025 0234   HGB 14.4 01/14/2024 1043   HCT 34.6 (L) 01/06/2025 0234   HCT 42.6 01/14/2024 1043   PLT 197 01/06/2025 0234   PLT 220 01/14/2024 1043   MCV 99.1 01/06/2025 0234   MCV 101 (H) 01/14/2024 1043   MCV 99 01/06/2015 1505   MCH 34.1 (H) 01/06/2025 0234   MCHC 34.4 01/06/2025 0234   RDW 12.6 01/06/2025 0234   RDW 11.9 01/14/2024 1043   RDW 13.0 01/06/2015 1505   LYMPHSABS 0.9 12/27/2024 0705   LYMPHSABS 1.6 01/14/2024 1043   LYMPHSABS 2.6 01/06/2015 1505   MONOABS 0.4 12/27/2024 0705   MONOABS 0.3 01/06/2015 1505   EOSABS 0.0 12/27/2024 0705   EOSABS 0.1 01/14/2024 1043   EOSABS 0.1 01/06/2015 1505   BASOSABS 0.0 12/27/2024 0705   BASOSABS 0.0 01/14/2024 1043   BASOSABS 0.0 01/06/2015 1505    BMET    Component Value Date/Time   NA 135 01/06/2025 0234   NA 141 01/06/2015 1505   K 4.4 01/06/2025 0234   K 3.9 01/06/2015 1505   CL 100 01/06/2025 0234   CL 107 01/06/2015 1505   CO2 26 01/06/2025 0234   CO2 29 01/06/2015 1505   GLUCOSE 91 01/06/2025 0234   GLUCOSE 111 (H) 01/06/2015 1505   BUN 22 (H) 01/06/2025 0234   BUN 16 01/06/2015 1505   CREATININE 1.22 01/06/2025 0234   CREATININE 1.28 01/06/2015 1505   CALCIUM  8.9 01/06/2025 0234   CALCIUM  9.0 01/06/2015 1505   GFRNONAA >60 01/06/2025 0234   GFRNONAA >60 01/06/2015 1505   GFRNONAA >60 05/10/2013 0535    IMAGING past 24 hours No results found.   Vitals:   01/05/25 2338 01/06/25 0512 01/06/25 0835 01/06/25 1158  BP: 121/74 115/63 113/71  105/68  Pulse: (!) 41 (!) 41 (!) 43 (!) 40  Resp: 16 16 15 18   Temp: 98.6 F (37 C) 98.4 F (36.9 C) 98.5 F (36.9 C) 98.3 F (36.8 C)  TempSrc: Oral Oral Oral Oral  SpO2: 98% 99% 96% 95%  Weight:      Height:         PHYSICAL EXAM General:  Alert, well-nourished, well-developed patient in no acute distress Psych:  Mood and affect appropriate for situation CV: Regular rate and rhythm on monitor Respiratory:  Regular, unlabored respirations on room air   NEURO:  Mental Status: Patient remains globally aphasic but responds to name and intermittently follows simple commands.  He will mimic Speech/Language: No verbal output, some vocalizations but no recognizable words  Cranial Nerves:  II: PERRL.  III, IV, VI: Left gaze deviation, able to go to midline VII: Face is symmetrical resting  VIII: hearing intact to voice. XII: tongue is midline without fasciculations. Motor: Able to move left upper and lower extremities with antigravity strength, RUE flaccid, moves right lower extremity but also does not break gravity Tone: is normal and bulk is normal Sensation- Intact to light touch bilaterally.  Coordination: Unable to perform Gait- deferred    ASSESSMENT/PLAN  Roy [name]  Roy [name] is a 59 y.o. male with history of HIV, hypertension, substance abuse and alcohol abuse admitted for acute onset right-sided weakness and aphasia.  He was given TNK to treat stroke and was found to have occlusion of left ICA/MCA, and mechanical thrombectomy was successfully performed.  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 s/p TNK and IR with TICI 3, etiology: Likely large vessel occlusion in the setting of multiple risk factors Code Stroke CT head hyperdense left MCA ASPECTS 7 CTA head & neck left ICA occlusion in the neck, left MCA origin occluded MRI acute left MCA territory infarct with confluent petechial hemorrhage, cytotoxic edema  and mild regional mass effect with trace rightward midline shift 2D Echo EF 50 to 55% Will need 30-day monitor at discharge LDL 55 HgbA1c 5.8 UDS positive for cocaine, benzo and fentanyl  VTE prophylaxis - heparin  subcu No antithrombotic prior to admission, now on aspirin  81 mg daily due to petechial hemorrhage Therapy recommendations:  CIR Disposition: Pending  Hypertension Home meds: Losartan  100 mg daily Stable Long-term BP goal normotensive  Hyperlipidemia Home meds: Rosuvastatin  20 mg daily, resumed in hospital LDL 55, goal < 70 Continue statin at discharge  Tobacco Abuse Patient smokes 0.25 packs per day for 40 years      Ready to quit? N/A Nicotine  replacement therapy provided  Cocaine abuse Patient uses cocaine UDS positive for Cocaine      Ready to quit? N/A TOC consult for cessation placed  Dysphagia Patient has post-stroke dysphagia, SLP consulted Now on dysphagia 1 and thin liquid Aspiration precautions Advance diet as tolerated  Other Stroke Risk Factors Alcohol abuse HIV, on HARRT therapy  Other Active Problems Mild thrombocytopenia, resolved, platelet 137--153--197  Hospital day # 10  Patient seen and examined by NP/APP with MD. MD to update note as needed.    Roy Geralds DNP, ACNPC-AG  [facility]  ATTENDING NOTE: I reviewed above note and agree with the assessment and plan. Pt was seen and examined.   No family at bedside.  Patient reclining chair, awake alert, mildly lethargic, still has expressive aphasia, however follow simple commands.  Left upper and lower extremity at least 4/5, right upper extremity flaccid, right lower 2+/5 proximal, 3/5 toe movement.  On aspirin  and statin.  Pending placement.  For detailed assessment and plan, please refer to above as I have made changes wherever appropriate.   Roy Cummins, MD PhD Stroke Neurology 01/06/2025 6:59 PM      To contact Stroke Continuity provider, please refer to  [email or web address]. After hours, contact General Neurology

## 2025-01-06 NOTE — Progress Notes (Signed)
 Occupational Therapy Treatment Patient Details Name: Roy [name] MRN: [id number] DOB: 06/02/1966 Today's Date: 01/06/2025   History of present illness 59 yo M adm 12/27/24 from [facility] rehab [facility] after fall with Rt weakness, AMS. Pt with Lt ICA/MCA CVA s/p Lt ICA/MCA thrombectomy complicated by Rt CFA occlusion from closure device requiring exploration and endarterectomy with angioplasty. Negative pressure dressing Rt groin  PMHx: cocaine & ETOH abuse, HIV, bipolar disorder, CAD   OT comments  Pt seen in conjunction with PT to maximize pts activity tolerance and optimize pt participation. Pt continues to present with R sided weakness, impaired balance and impaired communication. Pt currently requires MODA (+2 for safety) for functional ambulation with hemi walker, pt needs assistance to fully swing RLE through even with dorsiflexion wrap donned. Pt able to stand at sink for standing ADLs with CGA- MINA. Worked on lateral weight shifts into R hemi body at sink with a focus on dynamic reaching across midline to promote NMRE to R hemibody.  Patient will benefit from intensive inpatient follow-up therapy, >3 hours/day        If plan is discharge home, recommend the following:  A lot of help with walking and/or transfers;A lot of help with bathing/dressing/bathroom;Assistance with cooking/housework;Direct supervision/assist for medications management;Direct supervision/assist for financial management;Assist for transportation;Help with stairs or ramp for entrance   Equipment Recommendations  Other (comment) (defer)    Recommendations for Other Services      Precautions / Restrictions Precautions Precautions: Fall;Other (comment) Recall of Precautions/Restrictions: Impaired Precaution/Restrictions Comments: foley, aphasia, Rt hemiparesis Restrictions Weight Bearing Restrictions Per Provider Order: No       Mobility Bed Mobility Overal bed mobility: Needs Assistance Bed Mobility:  Supine to Sit     Supine to sit: Min assist     General bed mobility comments: education provided on hooking method to maneuver BLEs to EOB, light MIN A to elevate trunk. cues to sequence hand placement    Transfers Overall transfer level: Needs assistance Equipment used: 2 person hand held assist, Hemi-walker Transfers: Sit to/from Stand Sit to Stand: Min assist           General transfer comment: MIN A to rise from EOB and chair with no arm rests, cues for hand placement and physical assist needed to initially power up     Balance Overall balance assessment: Needs assistance Sitting-balance support: Feet supported Sitting balance-Leahy Scale: Fair     Standing balance support: Single extremity supported, During functional activity Standing balance-Leahy Scale: Fair Standing balance comment: CGA - MIN A to stand at sink for ADLs                           ADL either performed or assessed with clinical judgement   ADL Overall ADL's : Needs assistance/impaired     Grooming: Wash/dry face;Standing;Contact guard assist;Minimal assistance Grooming Details (indicate cue type and reason): CGA - MIN A for standing balance at sink         Upper Body Dressing : Sitting;Minimal assistance Upper Body Dressing Details (indicate cue type and reason): to don gown as back side cover     Toilet Transfer: Moderate assistance;Cueing for safety;Cueing for sequencing Toilet Transfer Details (indicate cue type and reason): simulated via functional ambulation with HW, MODA to advance RLE during swing even with Df wrap donned         Functional mobility during ADLs: Moderate assistance;+2 for safety/equipment General ADL Comments: ADL participation impacted  by R sided weakness, impaired communication.cog, impaired balance and impaired functional use of R hemibody    Extremity/Trunk Assessment Upper Extremity Assessment Upper Extremity Assessment: Right hand dominant;RUE  deficits/detail RUE Deficits / Details: noted trace activation in L shoulder during scapular protraction/retraction RUE Coordination: decreased gross motor;decreased fine motor LUE Deficits / Details: WFL   Lower Extremity Assessment Lower Extremity Assessment: Defer to PT evaluation        Vision   Additional Comments: difficult to assess vision d/t cog   Perception Perception Perception: Within Functional Limits   Praxis Praxis Praxis: WFL   Communication Communication Communication: Impaired Factors Affecting Communication: Difficulty expressing self   Cognition Arousal: Alert Behavior During Therapy: WFL for tasks assessed/performed Cognition: Difficult to assess Difficult to assess due to: Impaired communication           OT - Cognition Comments: able to follow simple commands and answer yes/no questions                 Following commands: Impaired Following commands impaired: Follows one step commands with increased time      Cueing   Cueing Techniques: Verbal cues, Gestural cues, Tactile cues  Exercises Other Exercises Other Exercises: NMRE provided to RUE with RUE supported on sink and pt instructed to lean laterally towards R side to remove soaps placed on paper towel dispenser with LUE to facilitate lateral weight shifts to R hemibody and initiate crossing midline Other Exercises: pt completed standing towel slides at sink with pt able to achieve trace scapular protraction/retraction in RUE when pt instructed to push the wash cloth forward into the sink    Shoulder Instructions       General Comments VSS,create sling out of ace wrap to support RUE during mobility    Pertinent Vitals/ Pain       Pain Assessment Pain Assessment: Faces Faces Pain Scale: No hurt  Home Living                                          Prior Functioning/Environment              Frequency  Min 2X/week        Progress Toward Goals  OT  Goals(current goals can now be found in the care plan section)  Progress towards OT goals: Progressing toward goals  Acute Rehab OT Goals OT Goal Formulation: Patient unable to participate in goal setting Time For Goal Achievement: 01/11/25 Potential to Achieve Goals: Good  Plan      Co-evaluation    PT/OT/SLP Co-Evaluation/Treatment: Yes Reason for Co-Treatment: Complexity of the patient's impairments (multi-system involvement);For patient/therapist safety   OT goals addressed during session: ADL's and self-care;Strengthening/ROM;Proper use of Adaptive equipment and DME      AM-PAC OT 6 Clicks Daily Activity     Outcome Measure   Help from another person eating meals?: A Little Help from another person taking care of personal grooming?: A Little Help from another person toileting, which includes using toliet, bedpan, or urinal?: A Lot Help from another person bathing (including washing, rinsing, drying)?: A Lot Help from another person to put on and taking off regular upper body clothing?: A Lot Help from another person to put on and taking off regular lower body clothing?: A Lot 6 Click Score: 14    End of Session Equipment Utilized During Treatment: Gait belt;Other (comment) (  hemiwalker)  OT Visit Diagnosis: Unsteadiness on feet (R26.81);Other abnormalities of gait and mobility (R26.89);Muscle weakness (generalized) (M62.81);Hemiplegia and hemiparesis;Cognitive communication deficit (R41.841) Symptoms and signs involving cognitive functions: Cerebral infarction Hemiplegia - Right/Left: Right Hemiplegia - dominant/non-dominant: Dominant Hemiplegia - caused by: Cerebral infarction   Activity Tolerance Patient tolerated treatment well   Patient Left in chair;with call bell/phone within reach;with chair alarm set   Nurse Communication Mobility status (secure chat)        Time: 8687-8664 OT Time Calculation (min): 23 min  Charges: OT General Charges $OT Visit: 1  Visit OT Treatments $Neuromuscular Re-education: 8-22 mins  Ronal Mallie POUR., COTA/L Acute Rehabilitation Services [phone]   Ronal Mallie Needy 01/06/2025, 2:37 PM

## 2025-01-06 NOTE — Plan of Care (Signed)
" °  Problem: Education: Goal: Knowledge of disease or condition will improve Outcome: Progressing   Problem: Health Behavior/Discharge Planning: Goal: Ability to manage health-related needs will improve Outcome: Progressing Goal: Goals will be collaboratively established with patient/family Outcome: Progressing   Problem: Self-Care: Goal: Ability to participate in self-care as condition permits will improve Outcome: Progressing Goal: Ability to communicate needs accurately will improve Outcome: Progressing   Problem: Nutrition: Goal: Risk of aspiration will decrease Outcome: Progressing   Problem: Education: Goal: Knowledge of the prescribed therapeutic regimen will improve Outcome: Progressing   Problem: Bowel/Gastric: Goal: Gastrointestinal status for postoperative course will improve Outcome: Progressing   Problem: Cardiac: Goal: Ability to maintain an adequate cardiac output Outcome: Progressing Goal: Will show no evidence of cardiac arrhythmias Outcome: Progressing   Problem: Nutritional: Goal: Will attain and maintain optimal nutritional status Outcome: Progressing   Problem: Neurological: Goal: Will regain or maintain usual level of consciousness Outcome: Progressing   Problem: Clinical Measurements: Goal: Ability to maintain clinical measurements within normal limits Outcome: Progressing Goal: Postoperative complications will be avoided or minimized Outcome: Progressing   Problem: Skin Integrity: Goal: Demonstrates signs of wound healing without infection Outcome: Progressing   "

## 2025-01-06 NOTE — Plan of Care (Signed)
  Problem: Education: Goal: Knowledge of disease or condition will improve Outcome: Progressing Goal: Knowledge of secondary prevention will improve (MUST DOCUMENT ALL) Outcome: Progressing Goal: Knowledge of patient specific risk factors will improve (DELETE if not current risk factor) Outcome: Progressing   Problem: Ischemic Stroke/TIA Tissue Perfusion: Goal: Complications of ischemic stroke/TIA will be minimized Outcome: Progressing

## 2025-01-06 NOTE — Progress Notes (Signed)
 Physical Therapy Treatment Patient Details Name: Roy [name] MRN: [id number] DOB: [birth year] Today's Date: 01/06/2025   History of Present Illness 59 yo M adm 12/27/24 from [facility] rehab center after fall with Rt weakness, AMS. Pt with Lt ICA/MCA CVA s/p Lt ICA/MCA thrombectomy complicated by Rt CFA occlusion from closure device requiring exploration and endarterectomy with angioplasty. Negative pressure dressing Rt groin  PMHx: cocaine & ETOH abuse, HIV, bipolar disorder, CAD    PT Comments  Pt tolerated treatment well today. Co-treat with OT. Pt today was provided an AFO made from ace wrapping for gait training. Pt with noted improvement with foot clearance only requiring Min A to advance RLE. Overall still Mod A +2 with hemiwalker. No change in DC/DME recs at this time. PT will continue to follow.     If plan is discharge home, recommend the following: Two people to help with walking and/or transfers;A lot of help with bathing/dressing/bathroom;Assistance with feeding;Assistance with cooking/housework;Direct supervision/assist for financial management;Assist for transportation;Direct supervision/assist for medications management;Supervision due to cognitive status   Can travel by private vehicle        Equipment Recommendations  BSC/3in1;Wheelchair (measurements PT);Wheelchair cushion (measurements PT)    Recommendations for Other Services Rehab consult     Precautions / Restrictions Precautions Precautions: Fall;Other (comment) Recall of Precautions/Restrictions: Impaired Precaution/Restrictions Comments: foley, aphasia, Rt hemiparesis Restrictions Weight Bearing Restrictions Per Provider Order: No     Mobility  Bed Mobility Overal bed mobility: Needs Assistance Bed Mobility: Supine to Sit     Supine to sit: Min assist     General bed mobility comments: education provided on hooking method to maneuver BLEs to EOB, light MIN A to elevate trunk. cues to sequence hand  placement    Transfers Overall transfer level: Needs assistance Equipment used: 2 person hand held assist, Hemi-walker Transfers: Sit to/from Stand Sit to Stand: Min assist           General transfer comment: MIN A to rise from EOB and chair with no arm rests, cues for hand placement and physical assist needed to initially power up    Ambulation/Gait Ambulation/Gait assistance: Mod assist, +2 safety/equipment, +2 physical assistance (Chair follow) Gait Distance (Feet): 25 Feet (15+10) Assistive device: Hemi-walker Gait Pattern/deviations: Step-to pattern, Decreased step length - right, Decreased dorsiflexion - right, Decreased weight shift to right, Knee hyperextension - right Gait velocity: decreased     General Gait Details: Pt able to ambulate to sink and back with hemiwalker. Cues for sequencing. Provided ace wrap AFO today with pt noted to have improving foot clearance. Only requiring Min A now to advance RLE.   Stairs             Wheelchair Mobility     Tilt Bed    Modified Rankin (Stroke Patients Only) Modified Rankin (Stroke Patients Only) Pre-Morbid Rankin Score: No symptoms Modified Rankin: Moderately severe disability     Balance Overall balance assessment: Needs assistance Sitting-balance support: Feet supported Sitting balance-Leahy Scale: Fair     Standing balance support: Single extremity supported, During functional activity Standing balance-Leahy Scale: Fair Standing balance comment: CGA - MIN A to stand at sink for ADLs                            Communication Communication Communication: Impaired Factors Affecting Communication: Difficulty expressing self  Cognition Arousal: Alert Behavior During Therapy: [facility] for tasks assessed/performed  Following commands: Impaired Following commands impaired: Follows one step commands with increased time    Cueing Cueing Techniques: Verbal  cues, Gestural cues, Tactile cues  Exercises Other Exercises Other Exercises: NMRE provided to RUE with RUE supported on sink and pt instructed to lean laterally towards R side to remove soaps placed on paper towel dispenser with LUE to facilitate lateral weight shifts to R hemibody and initiate crossing midline Other Exercises: pt completed standing towel slides at sink with pt able to achieve trace scapular protraction/retraction in RUE when pt instructed to push the wash cloth forward into the sink    General Comments General comments (skin integrity, edema, etc.): VSS. Created AFO with ace wrap.      Pertinent Vitals/Pain Pain Assessment Pain Assessment: Faces Faces Pain Scale: No hurt    Home Living                          Prior Function            PT Goals (current goals can now be found in the care plan section) Progress towards PT goals: Progressing toward goals    Frequency    Min 3X/week      PT Plan      Co-evaluation PT/OT/SLP Co-Evaluation/Treatment: Yes Reason for Co-Treatment: Complexity of the patient's impairments (multi-system involvement);For patient/therapist safety PT goals addressed during session: Mobility/safety with mobility;Balance;Proper use of DME OT goals addressed during session: ADL's and self-care;Strengthening/ROM;Proper use of Adaptive equipment and DME      AM-PAC PT 6 Clicks Mobility   Outcome Measure  Help needed turning from your back to your side while in a flat bed without using bedrails?: A Little Help needed moving from lying on your back to sitting on the side of a flat bed without using bedrails?: A Lot Help needed moving to and from a bed to a chair (including a wheelchair)?: A Lot Help needed standing up from a chair using your arms (e.g., wheelchair or bedside chair)?: A Little Help needed to walk in hospital room?: A Lot Help needed climbing 3-5 steps with a railing? : Total 6 Click Score: 13    End of  Session Equipment Utilized During Treatment: Gait belt Activity Tolerance: Patient tolerated treatment well Patient left: in chair;with call bell/phone within reach;with chair alarm set Nurse Communication: Mobility status PT Visit Diagnosis: Other abnormalities of gait and mobility (R26.89);Difficulty in walking, not elsewhere classified (R26.2);Hemiplegia and hemiparesis;Other symptoms and signs involving the nervous system (R29.898) Hemiplegia - Right/Left: Right Hemiplegia - dominant/non-dominant: Dominant Hemiplegia - caused by: Cerebral infarction     Time: 1311-1336 PT Time Calculation (min) (ACUTE ONLY): 25 min  Charges:    $Gait Training: 8-22 mins PT General Charges $$ ACUTE PT VISIT: 1 Visit                     Sueellen NOVAK, PT, DPT Acute Rehab Services [phone]    Kaysie Michelini 01/06/2025, 3:43 PM

## 2025-01-07 DIAGNOSIS — E785 Hyperlipidemia, unspecified: Secondary | ICD-10-CM | POA: Diagnosis not present

## 2025-01-07 DIAGNOSIS — R233 Spontaneous ecchymoses: Secondary | ICD-10-CM | POA: Diagnosis not present

## 2025-01-07 DIAGNOSIS — F1410 Cocaine abuse, uncomplicated: Secondary | ICD-10-CM | POA: Diagnosis not present

## 2025-01-07 DIAGNOSIS — B20 Human immunodeficiency virus [HIV] disease: Secondary | ICD-10-CM | POA: Diagnosis not present

## 2025-01-07 DIAGNOSIS — F1010 Alcohol abuse, uncomplicated: Secondary | ICD-10-CM | POA: Diagnosis not present

## 2025-01-07 DIAGNOSIS — I69391 Dysphagia following cerebral infarction: Secondary | ICD-10-CM | POA: Diagnosis not present

## 2025-01-07 DIAGNOSIS — F17210 Nicotine dependence, cigarettes, uncomplicated: Secondary | ICD-10-CM | POA: Diagnosis not present

## 2025-01-07 DIAGNOSIS — G936 Cerebral edema: Secondary | ICD-10-CM | POA: Diagnosis not present

## 2025-01-07 DIAGNOSIS — I63232 Cerebral infarction due to unspecified occlusion or stenosis of left carotid arteries: Secondary | ICD-10-CM | POA: Diagnosis not present

## 2025-01-07 DIAGNOSIS — I10 Essential (primary) hypertension: Secondary | ICD-10-CM | POA: Diagnosis not present

## 2025-01-07 NOTE — TOC Progression Note (Signed)
 Transition of Care [facility]) - Progression Note    Patient Details  Name: Roy [name] MRN: [id number] Date of Birth: 07/16/1966  [facility] CM/SW Contact  Almarie CHRISTELLA [place]ie, [place] Phone Number: 01/07/2025, 10:23 AM  Clinical Narrative:   Patient continues with no bed offers for SNF. Update provided to MD. CSW to follow.    Expected Discharge Plan: Skilled Nursing Facility Barriers to Discharge: Insurance Authorization, Inadequate or no insurance, Active Substance Use - Placement, SNF Pending bed offer               Expected Discharge Plan and Services                                               Social Drivers of Health (SDOH) Interventions SDOH Screenings   Food Insecurity: No Food Insecurity (06/03/2024)  Housing: Patient Declined (05/04/2024)  Transportation Needs: Unmet Transportation Needs (06/03/2024)  Utilities: At Risk (06/03/2024)  Depression (PHQ2-9): Low Risk (06/08/2024)  Recent Concern: Depression (PHQ2-9) - High Risk (06/07/2024)  Financial Resource Strain: Medium Risk (01/20/2024)   Received from [facility]  Tobacco Use: High Risk (12/31/2024)  Health Literacy: Low Risk  (07/02/2022)   Received from [facility]    Readmission Risk Interventions     No data to display

## 2025-01-07 NOTE — Plan of Care (Signed)
 " Problem: Education: Goal: Knowledge of disease or condition will improve Outcome: Progressing Goal: Knowledge of secondary prevention will improve (MUST DOCUMENT ALL) Outcome: Progressing Goal: Knowledge of patient specific risk factors will improve (DELETE if not current risk factor) Outcome: Progressing   Problem: Ischemic Stroke/TIA Tissue Perfusion: Goal: Complications of ischemic stroke/TIA will be minimized Outcome: Progressing   Problem: Coping: Goal: Will verbalize positive feelings about self Outcome: Progressing Goal: Will identify appropriate support needs Outcome: Progressing   Problem: Health Behavior/Discharge Planning: Goal: Ability to manage health-related needs will improve Outcome: Progressing Goal: Goals will be collaboratively established with patient/family Outcome: Progressing   Problem: Self-Care: Goal: Ability to participate in self-care as condition permits will improve Outcome: Progressing Goal: Verbalization of feelings and concerns over difficulty with self-care will improve Outcome: Progressing Goal: Ability to communicate needs accurately will improve Outcome: Progressing   Problem: Nutrition: Goal: Risk of aspiration will decrease Outcome: Progressing Goal: Dietary intake will improve Outcome: Progressing   Problem: Education: Goal: Knowledge of the prescribed therapeutic regimen will improve Outcome: Progressing   Problem: Bowel/Gastric: Goal: Gastrointestinal status for postoperative course will improve Outcome: Progressing   Problem: Cardiac: Goal: Ability to maintain an adequate cardiac output Outcome: Progressing Goal: Will show no evidence of cardiac arrhythmias Outcome: Progressing   Problem: Nutritional: Goal: Will attain and maintain optimal nutritional status Outcome: Progressing   Problem: Neurological: Goal: Will regain or maintain usual level of consciousness Outcome: Progressing   Problem: Clinical  Measurements: Goal: Ability to maintain clinical measurements within normal limits Outcome: Progressing Goal: Postoperative complications will be avoided or minimized Outcome: Progressing   Problem: Respiratory: Goal: Respiratory status will improve Outcome: Progressing   Problem: Skin Integrity: Goal: Demonstrates signs of wound healing without infection Outcome: Progressing   Problem: Urinary Elimination: Goal: Will remain free from infection Outcome: Progressing Goal: Ability to achieve and maintain adequate urine output Outcome: Progressing   Problem: Education: Goal: Knowledge of General Education information will improve Description: Including pain rating scale, medication(s)/side effects and non-pharmacologic comfort measures Outcome: Progressing   Problem: Health Behavior/Discharge Planning: Goal: Ability to manage health-related needs will improve Outcome: Progressing   Problem: Clinical Measurements: Goal: Ability to maintain clinical measurements within normal limits will improve Outcome: Progressing Goal: Will remain free from infection Outcome: Progressing Goal: Diagnostic test results will improve Outcome: Progressing Goal: Respiratory complications will improve Outcome: Progressing Goal: Cardiovascular complication will be avoided Outcome: Progressing   Problem: Activity: Goal: Risk for activity intolerance will decrease Outcome: Progressing   Problem: Nutrition: Goal: Adequate nutrition will be maintained Outcome: Progressing   Problem: Coping: Goal: Level of anxiety will decrease Outcome: Progressing   Problem: Elimination: Goal: Will not experience complications related to bowel motility Outcome: Progressing Goal: Will not experience complications related to urinary retention Outcome: Progressing   Problem: Pain Managment: Goal: General experience of comfort will improve and/or be controlled Outcome: Progressing   Problem:  Safety: Goal: Ability to remain free from injury will improve Outcome: Progressing   Problem: Skin Integrity: Goal: Risk for impaired skin integrity will decrease Outcome: Progressing   Problem: Education: Goal: Understanding of CV disease, CV risk reduction, and recovery process will improve Outcome: Progressing Goal: Individualized Educational Video(s) Outcome: Progressing   Problem: Activity: Goal: Ability to return to baseline activity level will improve Outcome: Progressing   Problem: Cardiovascular: Goal: Ability to achieve and maintain adequate cardiovascular perfusion will improve Outcome: Progressing Goal: Vascular access site(s) Level 0-1 will be maintained Outcome: Progressing   Problem: Health Behavior/Discharge  Planning: Goal: Ability to safely manage health-related needs after discharge will improve Outcome: Progressing   "

## 2025-01-07 NOTE — Progress Notes (Signed)
 PT Cancellation Note  Patient Details Name: Roy [name] MRN: [id number] DOB: 1965-12-31   Cancelled Treatment:    Reason Eval/Treat Not Completed: Patient declined, no reason specified (Pt repeated shaking his no that he does not want to participate in therapy right now.)   Maliyah Willets 01/07/2025, 11:45 AM

## 2025-01-07 NOTE — Plan of Care (Signed)
" °  Problem: Education: Goal: Knowledge of disease or condition will improve Outcome: Progressing   Problem: Health Behavior/Discharge Planning: Goal: Ability to manage health-related needs will improve Outcome: Progressing Goal: Goals will be collaboratively established with patient/family Outcome: Progressing   Problem: Self-Care: Goal: Ability to participate in self-care as condition permits will improve Outcome: Progressing Goal: Verbalization of feelings and concerns over difficulty with self-care will improve Outcome: Progressing Goal: Ability to communicate needs accurately will improve Outcome: Progressing   Problem: Nutrition: Goal: Risk of aspiration will decrease Outcome: Progressing   Problem: Education: Goal: Knowledge of the prescribed therapeutic regimen will improve Outcome: Progressing   Problem: Bowel/Gastric: Goal: Gastrointestinal status for postoperative course will improve Outcome: Progressing   Problem: Cardiac: Goal: Ability to maintain an adequate cardiac output Outcome: Progressing Goal: Will show no evidence of cardiac arrhythmias Outcome: Progressing   Problem: Clinical Measurements: Goal: Ability to maintain clinical measurements within normal limits Outcome: Progressing   Problem: Skin Integrity: Goal: Demonstrates signs of wound healing without infection Outcome: Progressing   Problem: Urinary Elimination: Goal: Will remain free from infection Outcome: Progressing   "

## 2025-01-07 NOTE — Progress Notes (Signed)
 PT Cancellation Note  Patient Details Name: Roy [name] MRN: [id number] DOB: [birth year]   Cancelled Treatment:    Reason Eval/Treat Not Completed: Patient declined, no reason specified (Attempted to see patient again and pt still refused. Will try again tomorrow.)   Deriona Altemose 01/07/2025, 3:23 PM

## 2025-01-07 NOTE — Progress Notes (Signed)
 Speech Language Pathology Treatment: Dysphagia;Cognitive-Linguistic  Patient Details Name: Roy [name] MRN: [id number] DOB: 1966/09/20 Today's Date: 01/07/2025 Time: 8475-8450 SLP Time Calculation (min) (ACUTE ONLY): 25 min  Assessment / Plan / Recommendation Clinical Impression  Cognitive-Linguistic: Pt appears to be a little more consistent with yes/no questions today. During direct testing for accuracy he was ~80% accurate with simple questioning when given extra time for processing and allowed to self-correct. Yes/no questions and binary choices were therefore used to facilitate communication as pt selected what music he would like to listen to. He tapped his fingers on the table when prompted to do so, although not along with the beat of the songs. He also prompted to try to sing or hum along, and although he never verbalized or hummed, he did the most vocalizing while music was playing.   Dysphagia: Pt continues to show improving awareness of R sided deficits with mirror placed in front of him, although not appearing to consistently utilize it. Today he has increased mastication of simulated Dys 2-3 textures with no expectoration necessary. Bolus preparation is fairly prolonged given lack of dentition, although per previous notes, he does not normally wear his dentures.   PLAN: Recommend continuing current diet for now. He is likely nearing readiness for diet advancement but would try to see across more PO trials (or even ideally during meal time) prior to making changes. Will continue to follow to facilitate functional communication as much as possible.    HPI HPI: 59 yo male presenting to ED 1/11 from EtOH rehab center with R sided weakness and AMS s/p fall. CTA shows proximal L MCA and ICA occlusion s/p TNK and thrombectomy. Complicated by R CFA occlusion from closure device requiring exploration and endarterectomy with angioplasty. MRI shows acute L MCA territory infarct, confluent at  the L basal ganglia, insula, and operculum as well as ongoing abnormal L ICA flow below the skull base. MBS 1/12 with primary oral dysphagia and only trace, transient penetration of thin liquids. PMH includes HIV, history of hepatitis C, bipolar disorder, cocaine and EtOH abuse      SLP Plan  Continue with current plan of care        Swallow Evaluation Recommendations   Recommendations: PO diet PO Diet Recommendation: Dysphagia 1 (Pureed);Thin liquids (Level 0) Liquid Administration via: Cup;Straw Medication Administration: Crushed with puree Supervision: Patient able to self-feed;Full supervision/cueing for swallowing strategies Postural changes: Position pt fully upright for meals Oral care recommendations: Oral care BID (2x/day)     Recommendations                         Frequent or constant Supervision/Assistance Dysphagia, oropharyngeal phase (R13.12);Aphasia (R47.01)     Continue with current plan of care     Leita SAILOR., M.A. CCC-SLP Acute Rehabilitation Services Office: [phone]  Secure chat preferred   01/07/2025, 4:18 PM

## 2025-01-07 NOTE — Progress Notes (Signed)
 B STROKE TEAM PROGRESS NOTE    SIGNIFICANT HOSPITAL EVENTS 1/11: Patient admitted with aphasia and right-sided weakness, given TNK and mechanical thrombectomy performed.  INTERIM HISTORY/SUBJECTIVE No family at the bedside. Pt lying in bed, eyes open, following commands. Neuro unchanged.   CBC    Component Value Date/Time   WBC 5.6 01/06/2025 0234   RBC 3.49 (L) 01/06/2025 0234   HGB 11.9 (L) 01/06/2025 0234   HGB 14.4 01/14/2024 1043   HCT 34.6 (L) 01/06/2025 0234   HCT 42.6 01/14/2024 1043   PLT 197 01/06/2025 0234   PLT 220 01/14/2024 1043   MCV 99.1 01/06/2025 0234   MCV 101 (H) 01/14/2024 1043   MCV 99 01/06/2015 1505   MCH 34.1 (H) 01/06/2025 0234   MCHC 34.4 01/06/2025 0234   RDW 12.6 01/06/2025 0234   RDW 11.9 01/14/2024 1043   RDW 13.0 01/06/2015 1505   LYMPHSABS 0.9 12/27/2024 0705   LYMPHSABS 1.6 01/14/2024 1043   LYMPHSABS 2.6 01/06/2015 1505   MONOABS 0.4 12/27/2024 0705   MONOABS 0.3 01/06/2015 1505   EOSABS 0.0 12/27/2024 0705   EOSABS 0.1 01/14/2024 1043   EOSABS 0.1 01/06/2015 1505   BASOSABS 0.0 12/27/2024 0705   BASOSABS 0.0 01/14/2024 1043   BASOSABS 0.0 01/06/2015 1505    BMET    Component Value Date/Time   NA 135 01/06/2025 0234   NA 141 01/06/2015 1505   K 4.4 01/06/2025 0234   K 3.9 01/06/2015 1505   CL 100 01/06/2025 0234   CL 107 01/06/2015 1505   CO2 26 01/06/2025 0234   CO2 29 01/06/2015 1505   GLUCOSE 91 01/06/2025 0234   GLUCOSE 111 (H) 01/06/2015 1505   BUN 22 (H) 01/06/2025 0234   BUN 16 01/06/2015 1505   CREATININE 1.22 01/06/2025 0234   CREATININE 1.28 01/06/2015 1505   CALCIUM  8.9 01/06/2025 0234   CALCIUM  9.0 01/06/2015 1505   GFRNONAA >60 01/06/2025 0234   GFRNONAA >60 01/06/2015 1505   GFRNONAA >60 05/10/2013 0535    IMAGING past 24 hours No results found.   Vitals:   01/07/25 0357 01/07/25 0742 01/07/25 1118 01/07/25 1511  BP: 117/71 124/75 108/67 114/67  Pulse: (!) 44 (!) 50 (!) 43 (!) 41  Resp:  18 16 15    Temp: 97.8 F (36.6 C) 98.6 F (37 C) 98.4 F (36.9 C) 98.8 F (37.1 C)  TempSrc: Oral Oral Oral Oral  SpO2: 97% 97% 98% 99%  Weight:      Height:         PHYSICAL EXAM General:  Alert, well-nourished, well-developed patient in no acute distress Psych:  Mood and affect appropriate for situation CV: Regular rate and rhythm on monitor Respiratory:  Regular, unlabored respirations on room air   NEURO:  Mental Status: Patient remains globally aphasic but responds to name and intermittently follows simple commands.  He will mimic Speech/Language: No verbal output, some vocalizations but no recognizable words  Cranial Nerves:  II: PERRL.  III, IV, VI: Left gaze deviation, able to go to midline VII: Face is symmetrical resting  VIII: hearing intact to voice. XII: tongue is midline without fasciculations. Motor: Able to move left upper and lower extremities with antigravity strength, RUE flaccid, moves right lower extremity but also does not break gravity Tone: is normal and bulk is normal Sensation- Intact to light touch bilaterally.  Coordination: Unable to perform Gait- deferred    ASSESSMENT/PLAN  [name] is a 59 y.o. male with history  of HIV, hypertension, substance abuse and alcohol abuse admitted for acute onset right-sided weakness and aphasia.  He was given TNK to treat stroke and was found to have occlusion of left ICA/MCA, and mechanical thrombectomy was successfully performed.  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 s/p TNK and IR with TICI 3, etiology: Likely large vessel occlusion in the setting of multiple risk factors Code Stroke CT head hyperdense left MCA ASPECTS 7 CTA head & neck left ICA occlusion in the neck, left MCA origin occluded MRI acute left MCA territory infarct with confluent petechial hemorrhage, cytotoxic edema and mild regional mass effect with trace rightward midline  shift 2D Echo EF 50 to 55% Will need 30-day monitor at discharge LDL 55 HgbA1c 5.8 UDS positive for cocaine, benzo and fentanyl  VTE prophylaxis - heparin  subcu No antithrombotic prior to admission, now on aspirin  81 mg daily due to petechial hemorrhage Therapy recommendations:  CIR Disposition: Pending  Hypertension Home meds: Losartan  100 mg daily Stable Long-term BP goal normotensive  Hyperlipidemia Home meds: Rosuvastatin  20 mg daily, resumed in hospital LDL 55, goal < 70 Continue statin at discharge  Tobacco Abuse Patient smokes 0.25 packs per day for 40 years      Ready to quit? N/A Nicotine  replacement therapy provided  Cocaine abuse Patient uses cocaine UDS positive for Cocaine      Ready to quit? N/A TOC consult for cessation placed  Dysphagia Patient has post-stroke dysphagia, SLP consulted Now on dysphagia 1 and thin liquid Aspiration precautions Advance diet as tolerated  Other Stroke Risk Factors Alcohol abuse HIV, on HARRT therapy  Other Active Problems Mild thrombocytopenia, resolved, platelet 137--153--197  Hospital day # 11    Ary Cummins, MD PhD Stroke Neurology 01/07/2025 7:28 PM      To contact Stroke Continuity provider, please refer to [email or web address]. After hours, contact General Neurology

## 2025-01-08 DIAGNOSIS — E785 Hyperlipidemia, unspecified: Secondary | ICD-10-CM | POA: Diagnosis not present

## 2025-01-08 DIAGNOSIS — R233 Spontaneous ecchymoses: Secondary | ICD-10-CM | POA: Diagnosis not present

## 2025-01-08 DIAGNOSIS — F1010 Alcohol abuse, uncomplicated: Secondary | ICD-10-CM | POA: Diagnosis not present

## 2025-01-08 DIAGNOSIS — F17210 Nicotine dependence, cigarettes, uncomplicated: Secondary | ICD-10-CM | POA: Diagnosis not present

## 2025-01-08 DIAGNOSIS — I10 Essential (primary) hypertension: Secondary | ICD-10-CM | POA: Diagnosis not present

## 2025-01-08 DIAGNOSIS — I69391 Dysphagia following cerebral infarction: Secondary | ICD-10-CM | POA: Diagnosis not present

## 2025-01-08 DIAGNOSIS — G936 Cerebral edema: Secondary | ICD-10-CM | POA: Diagnosis not present

## 2025-01-08 DIAGNOSIS — B20 Human immunodeficiency virus [HIV] disease: Secondary | ICD-10-CM | POA: Diagnosis not present

## 2025-01-08 DIAGNOSIS — I63232 Cerebral infarction due to unspecified occlusion or stenosis of left carotid arteries: Secondary | ICD-10-CM | POA: Diagnosis not present

## 2025-01-08 DIAGNOSIS — F1410 Cocaine abuse, uncomplicated: Secondary | ICD-10-CM | POA: Diagnosis not present

## 2025-01-08 NOTE — Progress Notes (Addendum)
 B STROKE TEAM PROGRESS NOTE    SIGNIFICANT HOSPITAL EVENTS 1/11: Patient admitted with aphasia and right-sided weakness, given TNK and mechanical thrombectomy performed.  INTERIM HISTORY/SUBJECTIVE No family at the bedside.  Patient is asleep laying in bed in no apparent distress.  No new neurological events overnight.  Neurological exam remains stable and unchanged  CBC    Component Value Date/Time   WBC 5.6 01/06/2025 0234   RBC 3.49 (L) 01/06/2025 0234   HGB 11.9 (L) 01/06/2025 0234   HGB 14.4 01/14/2024 1043   HCT 34.6 (L) 01/06/2025 0234   HCT 42.6 01/14/2024 1043   PLT 197 01/06/2025 0234   PLT 220 01/14/2024 1043   MCV 99.1 01/06/2025 0234   MCV 101 (H) 01/14/2024 1043   MCV 99 01/06/2015 1505   MCH 34.1 (H) 01/06/2025 0234   MCHC 34.4 01/06/2025 0234   RDW 12.6 01/06/2025 0234   RDW 11.9 01/14/2024 1043   RDW 13.0 01/06/2015 1505   LYMPHSABS 0.9 12/27/2024 0705   LYMPHSABS 1.6 01/14/2024 1043   LYMPHSABS 2.6 01/06/2015 1505   MONOABS 0.4 12/27/2024 0705   MONOABS 0.3 01/06/2015 1505   EOSABS 0.0 12/27/2024 0705   EOSABS 0.1 01/14/2024 1043   EOSABS 0.1 01/06/2015 1505   BASOSABS 0.0 12/27/2024 0705   BASOSABS 0.0 01/14/2024 1043   BASOSABS 0.0 01/06/2015 1505    BMET    Component Value Date/Time   NA 135 01/06/2025 0234   NA 141 01/06/2015 1505   K 4.4 01/06/2025 0234   K 3.9 01/06/2015 1505   CL 100 01/06/2025 0234   CL 107 01/06/2015 1505   CO2 26 01/06/2025 0234   CO2 29 01/06/2015 1505   GLUCOSE 91 01/06/2025 0234   GLUCOSE 111 (H) 01/06/2015 1505   BUN 22 (H) 01/06/2025 0234   BUN 16 01/06/2015 1505   CREATININE 1.22 01/06/2025 0234   CREATININE 1.28 01/06/2015 1505   CALCIUM  8.9 01/06/2025 0234   CALCIUM  9.0 01/06/2015 1505   GFRNONAA >60 01/06/2025 0234   GFRNONAA >60 01/06/2015 1505   GFRNONAA >60 05/10/2013 0535    IMAGING past 24 hours No results found.   Vitals:   01/07/25 1948 01/08/25 0251 01/08/25 0835 01/08/25 1125  BP:  122/73 114/69 114/72 116/70  Pulse: (!) 42 (!) 44 (!) 42 (!) 41  Resp: 17 16 15 15   Temp: 98.3 F (36.8 C) 97.9 F (36.6 C) 97.7 F (36.5 C) 98.1 F (36.7 C)  TempSrc: Oral  Oral Oral  SpO2: 99% 97% 99% 99%  Weight:      Height:         PHYSICAL EXAM General:  Alert, well-nourished, well-developed patient in no acute distress Psych:  Mood and affect appropriate for situation CV: Regular rate and rhythm on monitor Respiratory:  Regular, unlabored respirations on room air   NEURO:  Mental Status: Patient remains globally aphasic but responds to name and intermittently follows simple commands.  He will mimic Speech/Language: No verbal output, some vocalizations but no recognizable words  Cranial Nerves:  II: PERRL.  III, IV, VI: Left gaze deviation, able to go to midline VII: Face is symmetrical resting  VIII: hearing intact to voice. XII: tongue is midline without fasciculations. Motor: Able to move left upper and lower extremities with antigravity strength, RUE flaccid, moves right lower extremity but also does not break gravity Tone: is normal and bulk is normal Sensation- Intact to light touch bilaterally.  Coordination: Unable to perform Gait- deferred  ASSESSMENT/PLAN  [name] is a 59 y.o. male with history of HIV, hypertension, substance abuse and alcohol abuse admitted for acute onset right-sided weakness and aphasia.  He was given TNK to treat stroke and was found to have occlusion of left ICA/MCA, and mechanical thrombectomy was successfully performed.  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 s/p TNK and IR with TICI 3, etiology: Likely large vessel occlusion in the setting of multiple risk factors Code Stroke CT head hyperdense left MCA ASPECTS 7 CTA head & neck left ICA occlusion in the neck, left MCA origin occluded MRI acute left MCA territory infarct with confluent petechial hemorrhage,  cytotoxic edema and mild regional mass effect with trace rightward midline shift 2D Echo EF 50 to 55% Will need 30-day monitor at discharge LDL 55 HgbA1c 5.8 UDS positive for cocaine, benzo and fentanyl  VTE prophylaxis - heparin  subcu No antithrombotic prior to admission, now on aspirin  81 mg daily due to petechial hemorrhage Therapy recommendations:  CIR Disposition: Pending  Hypertension Home meds: Losartan  100 mg daily Stable Long-term BP goal normotensive  Hyperlipidemia Home meds: Rosuvastatin  20 mg daily, resumed in hospital LDL 55, goal < 70 Continue statin at discharge  Tobacco Abuse Patient smokes 0.25 packs per day for 40 years      Ready to quit? N/A Nicotine  replacement therapy provided  Cocaine abuse Patient uses cocaine UDS positive for Cocaine      Ready to quit? N/A TOC consult for cessation placed  Dysphagia Patient has post-stroke dysphagia, SLP consulted Now on dysphagia 1 and thin liquid Aspiration precautions Advance diet as tolerated  Other Stroke Risk Factors Alcohol abuse HIV, on HARRT therapy  Other Active Problems Mild thrombocytopenia, resolved, platelet 137--153--197  Hospital day # 12   Roy Geralds DNP, ACNPC-AG  [facility]  ATTENDING NOTE: I reviewed above note and agree with the assessment and plan. Pt was seen and examined.   No family at bedside.  Patient lying bed, neuro stable, no change.  Still has expressive aphasia and right hemiplegia.  Still pending placement.  Continue current management.  For detailed assessment and plan, please refer to above as I have made changes wherever appropriate.   Ary Cummins, MD PhD Stroke Neurology 01/08/2025 7:31 PM      To contact Stroke Continuity provider, please refer to [email or web address]. After hours, contact General Neurology

## 2025-01-08 NOTE — Plan of Care (Signed)
" °  Problem: Ischemic Stroke/TIA Tissue Perfusion: Goal: Complications of ischemic stroke/TIA will be minimized Outcome: Progressing   Problem: Nutrition: Goal: Risk of aspiration will decrease Outcome: Progressing Goal: Dietary intake will improve Outcome: Progressing   Problem: Respiratory: Goal: Respiratory status will improve Outcome: Progressing   Problem: Skin Integrity: Goal: Demonstrates signs of wound healing without infection Outcome: Progressing   "

## 2025-01-08 NOTE — Progress Notes (Signed)
 Physical Therapy Treatment Patient Details Name: Roy [name] MRN: [id number] DOB: 26-Jun-1966 Today's Date: 01/08/2025   History of Present Illness 59 yo M adm 12/27/24 from [facility]TOH rehab center after fall with Rt weakness, AMS. Pt with Lt ICA/MCA CVA s/p Lt ICA/MCA thrombectomy complicated by Rt CFA occlusion from closure device requiring exploration and endarterectomy with angioplasty. Negative pressure dressing Rt groin  PMHx: cocaine & ETOH abuse, HIV, bipolar disorder, CAD    PT Comments  PT initially presented to pt's room to assist patient with OOB mobility and exercises. Pt repeatedly shook his head no to OOB activity, agreeable to PROM exercises to RUE and RLE. PT provided education to benefits of exercise and OOB activity for stroke recovery; pt continued to decline, session terminated.  RN reached out to PT shortly after end of session and reported pt wanted to get to [facility] for BM.  PT returned, pt completed bed mobility to R edge at [facility] A, transferred to/from [facility] with stedy at [facility] A. Pt able to communicate with 'thumbs up' once finished, DEP for pericare. Returned to bed, PT assisted with positioning pt in bed. RN notified of results.Continued to encourage pt to participate in therapy session to improve mobility, balance, and R UE/LE function, pt nods and is agreeable. PT continues to recommend high intensity rehab at discharge to improve overall function and address continued deficits listed below.   If plan is discharge home, recommend the following: Two people to help with walking and/or transfers;A lot of help with bathing/dressing/bathroom;Assistance with feeding;Assistance with cooking/housework;Direct supervision/assist for financial management;Assist for transportation;Direct supervision/assist for medications management;Supervision due to cognitive status   Can travel by private vehicle        Equipment Recommendations  BSC/3in1;Wheelchair (measurements PT);Wheelchair cushion  (measurements PT)    Recommendations for Other Services       Precautions / Restrictions Precautions Precautions: Fall;Other (comment) Recall of Precautions/Restrictions: Impaired Precaution/Restrictions Comments: foley, aphasia, Rt hemiparesis Restrictions Weight Bearing Restrictions Per Provider Order: No     Mobility  Bed Mobility Overal bed mobility: Needs Assistance Bed Mobility: Supine to Sit, Sit to Supine     Supine to sit: Min assist, HOB elevated, Used rails Sit to supine: Min assist, HOB elevated, Used rails   General bed mobility comments: MIN A for sit<>supine at R edge of bed with assistance for positioning of RUE, movement of RLE and scooting to edge adequately via draw sheet    Transfers Overall transfer level: Needs assistance   Transfers: Sit to/from Stand, Bed to chair/wheelchair/BSC Sit to Stand: Min assist           General transfer comment: MIN A with hand over hand support of RUE to pull to stand on stedy, x1 person assist bed<>BSC via stedy, tolerates standing at [facility] for 1-2 min for pericare after continent BM, returned to bed as noted above Transfer via Lift Equipment: Stedy  Ambulation/Gait                   Stairs             Wheelchair Mobility     Tilt Bed    Modified Rankin (Stroke Patients Only)       Balance Overall balance assessment: Needs assistance Sitting-balance support: Feet supported Sitting balance-Leahy Scale: Fair Sitting balance - Comments: supervision for safety, no trunk lean noted   Standing balance support: Single extremity supported, During functional activity Standing balance-Leahy Scale: Fair Standing balance comment: CGA to stand in stedy  while PT completed pericare                            Communication Communication Communication: Impaired Factors Affecting Communication: Difficulty expressing self (aphasia)  Cognition Arousal: Alert Behavior During Therapy: Flat  affect   PT - Cognitive impairments: Difficult to assess Difficult to assess due to: Impaired communication                     PT - Cognition Comments: able to nod yes/no >75% of the time Following commands: Impaired Following commands impaired: Follows one step commands with increased time    Cueing Cueing Techniques: Verbal cues, Gestural cues, Tactile cues  Exercises      General Comments        Pertinent Vitals/Pain Pain Assessment Pain Assessment: Faces Faces Pain Scale: Hurts little more Pain Location: abdomen Pain Descriptors / Indicators: Discomfort Pain Intervention(s): Repositioned, Other (comment) (assisted to BSC, large BM)    Home Living                          Prior Function            PT Goals (current goals can now be found in the care plan section) Acute Rehab PT Goals Patient Stated Goal: walk PT Goal Formulation: With family Time For Goal Achievement: 01/11/25 Potential to Achieve Goals: Fair Progress towards PT goals: Progressing toward goals    Frequency    Min 3X/week      PT Plan      Co-evaluation              AM-PAC PT 6 Clicks Mobility   Outcome Measure  Help needed turning from your back to your side while in a flat bed without using bedrails?: A Little Help needed moving from lying on your back to sitting on the side of a flat bed without using bedrails?: A Lot Help needed moving to and from a bed to a chair (including a wheelchair)?: A Lot Help needed standing up from a chair using your arms (e.g., wheelchair or bedside chair)?: A Little Help needed to walk in hospital room?: Total Help needed climbing 3-5 steps with a railing? : Total 6 Click Score: 12    End of Session Equipment Utilized During Treatment: Gait belt;Other (comment) (stedy for ease of transfer to/from [facility]) Activity Tolerance: Patient tolerated treatment well Patient left: in bed;with call bell/phone within reach;with bed alarm  set;with family/visitor present Nurse Communication: Mobility status;Other (comment) (large BM) PT Visit Diagnosis: Other abnormalities of gait and mobility (R26.89);Difficulty in walking, not elsewhere classified (R26.2);Hemiplegia and hemiparesis;Other symptoms and signs involving the nervous system (R29.898) Hemiplegia - Right/Left: Right Hemiplegia - dominant/non-dominant: Dominant Hemiplegia - caused by: Cerebral infarction     Time: 8470-8382 PT Time Calculation (min) (ACUTE ONLY): 48 min (PT out of room 1540-1548, not charged for)  Charges:    $Therapeutic Activity: 38-52 mins                       Isaiah DEL. Bedford Winsor, PT, DPT    [employer] 01/08/2025, 4:29 PM

## 2025-01-08 NOTE — Plan of Care (Signed)
 " Problem: Education: Goal: Knowledge of disease or condition will improve Outcome: Progressing Goal: Knowledge of secondary prevention will improve (MUST DOCUMENT ALL) Outcome: Progressing Goal: Knowledge of patient specific risk factors will improve (DELETE if not current risk factor) Outcome: Progressing   Problem: Ischemic Stroke/TIA Tissue Perfusion: Goal: Complications of ischemic stroke/TIA will be minimized Outcome: Progressing   Problem: Coping: Goal: Will verbalize positive feelings about self Outcome: Progressing Goal: Will identify appropriate support needs Outcome: Progressing   Problem: Health Behavior/Discharge Planning: Goal: Ability to manage health-related needs will improve Outcome: Progressing Goal: Goals will be collaboratively established with patient/family Outcome: Progressing   Problem: Self-Care: Goal: Ability to participate in self-care as condition permits will improve Outcome: Progressing Goal: Verbalization of feelings and concerns over difficulty with self-care will improve Outcome: Progressing Goal: Ability to communicate needs accurately will improve Outcome: Progressing   Problem: Nutrition: Goal: Risk of aspiration will decrease Outcome: Progressing Goal: Dietary intake will improve Outcome: Progressing   Problem: Education: Goal: Knowledge of the prescribed therapeutic regimen will improve Outcome: Progressing   Problem: Bowel/Gastric: Goal: Gastrointestinal status for postoperative course will improve Outcome: Progressing   Problem: Cardiac: Goal: Ability to maintain an adequate cardiac output Outcome: Progressing Goal: Will show no evidence of cardiac arrhythmias Outcome: Progressing   Problem: Nutritional: Goal: Will attain and maintain optimal nutritional status Outcome: Progressing   Problem: Neurological: Goal: Will regain or maintain usual level of consciousness Outcome: Progressing   Problem: Clinical  Measurements: Goal: Ability to maintain clinical measurements within normal limits Outcome: Progressing Goal: Postoperative complications will be avoided or minimized Outcome: Progressing   Problem: Respiratory: Goal: Respiratory status will improve Outcome: Progressing   Problem: Skin Integrity: Goal: Demonstrates signs of wound healing without infection Outcome: Progressing   Problem: Urinary Elimination: Goal: Will remain free from infection Outcome: Progressing Goal: Ability to achieve and maintain adequate urine output Outcome: Progressing   Problem: Education: Goal: Knowledge of General Education information will improve Description: Including pain rating scale, medication(s)/side effects and non-pharmacologic comfort measures Outcome: Progressing   Problem: Health Behavior/Discharge Planning: Goal: Ability to manage health-related needs will improve Outcome: Progressing   Problem: Clinical Measurements: Goal: Ability to maintain clinical measurements within normal limits will improve Outcome: Progressing Goal: Will remain free from infection Outcome: Progressing Goal: Diagnostic test results will improve Outcome: Progressing Goal: Respiratory complications will improve Outcome: Progressing Goal: Cardiovascular complication will be avoided Outcome: Progressing   Problem: Activity: Goal: Risk for activity intolerance will decrease Outcome: Progressing   Problem: Nutrition: Goal: Adequate nutrition will be maintained Outcome: Progressing   Problem: Coping: Goal: Level of anxiety will decrease Outcome: Progressing   Problem: Elimination: Goal: Will not experience complications related to bowel motility Outcome: Progressing Goal: Will not experience complications related to urinary retention Outcome: Progressing   Problem: Pain Managment: Goal: General experience of comfort will improve and/or be controlled Outcome: Progressing   Problem:  Safety: Goal: Ability to remain free from injury will improve Outcome: Progressing   Problem: Skin Integrity: Goal: Risk for impaired skin integrity will decrease Outcome: Progressing   Problem: Education: Goal: Understanding of CV disease, CV risk reduction, and recovery process will improve Outcome: Progressing Goal: Individualized Educational Video(s) Outcome: Progressing   Problem: Activity: Goal: Ability to return to baseline activity level will improve Outcome: Progressing   Problem: Cardiovascular: Goal: Ability to achieve and maintain adequate cardiovascular perfusion will improve Outcome: Progressing Goal: Vascular access site(s) Level 0-1 will be maintained Outcome: Progressing   Problem: Health Behavior/Discharge  Planning: Goal: Ability to safely manage health-related needs after discharge will improve Outcome: Progressing   "

## 2025-01-09 DIAGNOSIS — Z7982 Long term (current) use of aspirin: Secondary | ICD-10-CM | POA: Diagnosis not present

## 2025-01-09 DIAGNOSIS — I63232 Cerebral infarction due to unspecified occlusion or stenosis of left carotid arteries: Secondary | ICD-10-CM | POA: Diagnosis not present

## 2025-01-09 DIAGNOSIS — E785 Hyperlipidemia, unspecified: Secondary | ICD-10-CM | POA: Diagnosis not present

## 2025-01-09 DIAGNOSIS — B20 Human immunodeficiency virus [HIV] disease: Secondary | ICD-10-CM | POA: Diagnosis not present

## 2025-01-09 DIAGNOSIS — F17210 Nicotine dependence, cigarettes, uncomplicated: Secondary | ICD-10-CM | POA: Diagnosis not present

## 2025-01-09 DIAGNOSIS — F1410 Cocaine abuse, uncomplicated: Secondary | ICD-10-CM | POA: Diagnosis not present

## 2025-01-09 DIAGNOSIS — I10 Essential (primary) hypertension: Secondary | ICD-10-CM | POA: Diagnosis not present

## 2025-01-09 DIAGNOSIS — I69391 Dysphagia following cerebral infarction: Secondary | ICD-10-CM | POA: Diagnosis not present

## 2025-01-09 DIAGNOSIS — F1010 Alcohol abuse, uncomplicated: Secondary | ICD-10-CM | POA: Diagnosis not present

## 2025-01-09 DIAGNOSIS — G936 Cerebral edema: Secondary | ICD-10-CM | POA: Diagnosis not present

## 2025-01-09 DIAGNOSIS — R233 Spontaneous ecchymoses: Secondary | ICD-10-CM | POA: Diagnosis not present

## 2025-01-09 LAB — BASIC METABOLIC PANEL WITH GFR
Anion gap: 12 (ref 5–15)
BUN: 23 mg/dL — ABNORMAL HIGH (ref 6–20)
CO2: 24 mmol/L (ref 22–32)
Calcium: 8.9 mg/dL (ref 8.9–10.3)
Chloride: 102 mmol/L (ref 98–111)
Creatinine, Ser: 1.2 mg/dL (ref 0.61–1.24)
GFR, Estimated: >60 mL/min
Glucose, Bld: 91 mg/dL (ref 70–99)
Potassium: 4.2 mmol/L (ref 3.5–5.1)
Sodium: 137 mmol/L (ref 135–145)

## 2025-01-09 LAB — CBC
HCT: 34.4 % — ABNORMAL LOW (ref 39.0–52.0)
Hemoglobin: 11.8 g/dL — ABNORMAL LOW (ref 13.0–17.0)
MCH: 33.7 pg (ref 26.0–34.0)
MCHC: 34.3 g/dL (ref 30.0–36.0)
MCV: 98.3 fL (ref 80.0–100.0)
Platelets: 223 10*3/uL (ref 150–400)
RBC: 3.5 MIL/uL — ABNORMAL LOW (ref 4.22–5.81)
RDW: 12.8 % (ref 11.5–15.5)
WBC: 6.5 10*3/uL (ref 4.0–10.5)
nRBC: 0 % (ref 0.0–0.2)

## 2025-01-09 NOTE — Progress Notes (Addendum)
 B STROKE TEAM PROGRESS NOTE    SIGNIFICANT HOSPITAL EVENTS 1/11: Patient admitted with aphasia and right-sided weakness, given TNK and mechanical thrombectomy performed.  INTERIM HISTORY/SUBJECTIVE Patient laying in bed in no apparent distress.  No family at the bedside.  No new neurological events overnight.  Neurological exam remains stable and unchanged  CBC    Component Value Date/Time   WBC 6.5 01/08/2025 2338   RBC 3.50 (L) 01/08/2025 2338   HGB 11.8 (L) 01/08/2025 2338   HGB 14.4 01/14/2024 1043   HCT 34.4 (L) 01/08/2025 2338   HCT 42.6 01/14/2024 1043   PLT 223 01/08/2025 2338   PLT 220 01/14/2024 1043   MCV 98.3 01/08/2025 2338   MCV 101 (H) 01/14/2024 1043   MCV 99 01/06/2015 1505   MCH 33.7 01/08/2025 2338   MCHC 34.3 01/08/2025 2338   RDW 12.8 01/08/2025 2338   RDW 11.9 01/14/2024 1043   RDW 13.0 01/06/2015 1505   LYMPHSABS 0.9 12/27/2024 0705   LYMPHSABS 1.6 01/14/2024 1043   LYMPHSABS 2.6 01/06/2015 1505   MONOABS 0.4 12/27/2024 0705   MONOABS 0.3 01/06/2015 1505   EOSABS 0.0 12/27/2024 0705   EOSABS 0.1 01/14/2024 1043   EOSABS 0.1 01/06/2015 1505   BASOSABS 0.0 12/27/2024 0705   BASOSABS 0.0 01/14/2024 1043   BASOSABS 0.0 01/06/2015 1505    BMET    Component Value Date/Time   NA 137 01/08/2025 2338   NA 141 01/06/2015 1505   K 4.2 01/08/2025 2338   K 3.9 01/06/2015 1505   CL 102 01/08/2025 2338   CL 107 01/06/2015 1505   CO2 24 01/08/2025 2338   CO2 29 01/06/2015 1505   GLUCOSE 91 01/08/2025 2338   GLUCOSE 111 (H) 01/06/2015 1505   BUN 23 (H) 01/08/2025 2338   BUN 16 01/06/2015 1505   CREATININE 1.20 01/08/2025 2338   CREATININE 1.28 01/06/2015 1505   CALCIUM  8.9 01/08/2025 2338   CALCIUM  9.0 01/06/2015 1505   GFRNONAA >60 01/08/2025 2338   GFRNONAA >60 01/06/2015 1505   GFRNONAA >60 05/10/2013 0535    IMAGING past 24 hours No results found.   Vitals:   01/09/25 0004 01/09/25 0509 01/09/25 0832 01/09/25 1154  BP: 131/81 115/65  131/76 114/71  Pulse: (!) 45 (!) 49 (!) 47 (!) 47  Resp: 17 18 18 19   Temp: 99 F (37.2 C) 98.8 F (37.1 C) 98.6 F (37 C) 98.9 F (37.2 C)  TempSrc: Oral Oral Oral Oral  SpO2: 97% 100% 98% 99%  Weight:      Height:         PHYSICAL EXAM General:  Alert, well-nourished, well-developed patient in no acute distress Psych:  Mood and affect appropriate for situation CV: Regular rate and rhythm on monitor Respiratory:  Regular, unlabored respirations on room air   NEURO:  Mental Status: Patient remains globally aphasic but responds to name and intermittently follows simple commands.  He will mimic Speech/Language: No verbal output, some vocalizations but no recognizable words  Cranial Nerves:  II: PERRL.  III, IV, VI: Left gaze deviation, able to go to midline VII: Face is symmetrical resting  VIII: hearing intact to voice. XII: tongue is midline without fasciculations. Motor: Able to move left upper and lower extremities with antigravity strength, RUE some movement against gravity can lift his forearm up, moves right lower extremity horizontal movement but also does not break gravity Tone: is normal and bulk is normal Sensation- Intact to light touch bilaterally.  Coordination: Unable  to perform Gait- deferred    ASSESSMENT/PLAN  [name] is a 59 y.o. male with history of HIV, hypertension, substance abuse and alcohol abuse admitted for acute onset right-sided weakness and aphasia.  He was given TNK to treat stroke and was found to have occlusion of left ICA/MCA, and mechanical thrombectomy was successfully performed.  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 s/p TNK and IR with TICI 3, etiology: Likely large vessel occlusion in the setting of multiple risk factors Code Stroke CT head hyperdense left MCA ASPECTS 7 CTA head & neck left ICA occlusion in the neck, left MCA origin occluded MRI acute left MCA  territory infarct with confluent petechial hemorrhage, cytotoxic edema and mild regional mass effect with trace rightward midline shift 2D Echo EF 50 to 55% Will need 30-day monitor at discharge LDL 55 HgbA1c 5.8 UDS positive for cocaine, benzo and fentanyl  VTE prophylaxis - heparin  subcu No antithrombotic prior to admission, now on aspirin  81 mg daily due to petechial hemorrhage Therapy recommendations:  CIR Disposition: Pending  Hypertension Home meds: Losartan  100 mg daily Stable Long-term BP goal normotensive  Hyperlipidemia Home meds: Rosuvastatin  20 mg daily, resumed in hospital LDL 55, goal < 70 Continue statin at discharge  Tobacco Abuse Patient smokes 0.25 packs per day for 40 years      Ready to quit? N/A Nicotine  replacement therapy provided  Cocaine abuse Patient uses cocaine UDS positive for Cocaine      Ready to quit? N/A TOC consult for cessation placed  Dysphagia Patient has post-stroke dysphagia, SLP consulted Now on dysphagia 1 and thin liquid Aspiration precautions Advance diet as tolerated  Other Stroke Risk Factors Alcohol abuse HIV, on HARRT therapy  Other Active Problems Mild thrombocytopenia, resolved, platelet 137--153--197  Hospital day # 13   Karna Geralds DNP, ACNPC-AG  [facility]  I have personally obtained history,examined this patient, reviewed notes, independently viewed imaging studies, participated in medical decision making and plan of care.ROS completed by me personally and pertinent positives fully documented  I have made any additions or clarifications directly to the above note. Agree with note above.  Patient neurological exam is unchanged.  He is awaiting discharge to skilled nursing facility but is a difficult placement  Eather Popp, MD Medical Director [facility] Pager: [phone] 01/09/2025 1:15 PM   To contact Stroke Continuity provider, please refer to [email or web address]. After hours, contact  General Neurology

## 2025-01-09 NOTE — Plan of Care (Signed)
 " Problem: Education: Goal: Knowledge of disease or condition will improve Outcome: Progressing Goal: Knowledge of secondary prevention will improve (MUST DOCUMENT ALL) Outcome: Progressing Goal: Knowledge of patient specific risk factors will improve (DELETE if not current risk factor) Outcome: Progressing   Problem: Ischemic Stroke/TIA Tissue Perfusion: Goal: Complications of ischemic stroke/TIA will be minimized Outcome: Progressing   Problem: Coping: Goal: Will verbalize positive feelings about self Outcome: Progressing Goal: Will identify appropriate support needs Outcome: Progressing   Problem: Health Behavior/Discharge Planning: Goal: Ability to manage health-related needs will improve Outcome: Progressing Goal: Goals will be collaboratively established with patient/family Outcome: Progressing   Problem: Self-Care: Goal: Ability to participate in self-care as condition permits will improve Outcome: Progressing Goal: Verbalization of feelings and concerns over difficulty with self-care will improve Outcome: Progressing Goal: Ability to communicate needs accurately will improve Outcome: Progressing   Problem: Nutrition: Goal: Risk of aspiration will decrease Outcome: Progressing Goal: Dietary intake will improve Outcome: Progressing   Problem: Education: Goal: Knowledge of the prescribed therapeutic regimen will improve Outcome: Progressing   Problem: Bowel/Gastric: Goal: Gastrointestinal status for postoperative course will improve Outcome: Progressing   Problem: Cardiac: Goal: Ability to maintain an adequate cardiac output Outcome: Progressing Goal: Will show no evidence of cardiac arrhythmias Outcome: Progressing   Problem: Nutritional: Goal: Will attain and maintain optimal nutritional status Outcome: Progressing   Problem: Neurological: Goal: Will regain or maintain usual level of consciousness Outcome: Progressing   Problem: Clinical  Measurements: Goal: Ability to maintain clinical measurements within normal limits Outcome: Progressing Goal: Postoperative complications will be avoided or minimized Outcome: Progressing   Problem: Respiratory: Goal: Respiratory status will improve Outcome: Progressing   Problem: Skin Integrity: Goal: Demonstrates signs of wound healing without infection Outcome: Progressing   Problem: Urinary Elimination: Goal: Will remain free from infection Outcome: Progressing Goal: Ability to achieve and maintain adequate urine output Outcome: Progressing   Problem: Education: Goal: Knowledge of General Education information will improve Description: Including pain rating scale, medication(s)/side effects and non-pharmacologic comfort measures Outcome: Progressing   Problem: Health Behavior/Discharge Planning: Goal: Ability to manage health-related needs will improve Outcome: Progressing   Problem: Clinical Measurements: Goal: Ability to maintain clinical measurements within normal limits will improve Outcome: Progressing Goal: Will remain free from infection Outcome: Progressing Goal: Diagnostic test results will improve Outcome: Progressing Goal: Respiratory complications will improve Outcome: Progressing Goal: Cardiovascular complication will be avoided Outcome: Progressing   Problem: Activity: Goal: Risk for activity intolerance will decrease Outcome: Progressing   Problem: Nutrition: Goal: Adequate nutrition will be maintained Outcome: Progressing   Problem: Coping: Goal: Level of anxiety will decrease Outcome: Progressing   Problem: Elimination: Goal: Will not experience complications related to bowel motility Outcome: Progressing Goal: Will not experience complications related to urinary retention Outcome: Progressing   Problem: Pain Managment: Goal: General experience of comfort will improve and/or be controlled Outcome: Progressing   Problem:  Safety: Goal: Ability to remain free from injury will improve Outcome: Progressing   Problem: Skin Integrity: Goal: Risk for impaired skin integrity will decrease Outcome: Progressing   Problem: Education: Goal: Understanding of CV disease, CV risk reduction, and recovery process will improve Outcome: Progressing Goal: Individualized Educational Video(s) Outcome: Progressing   Problem: Activity: Goal: Ability to return to baseline activity level will improve Outcome: Progressing   Problem: Cardiovascular: Goal: Ability to achieve and maintain adequate cardiovascular perfusion will improve Outcome: Progressing Goal: Vascular access site(s) Level 0-1 will be maintained Outcome: Progressing   Problem: Health Behavior/Discharge  Planning: Goal: Ability to safely manage health-related needs after discharge will improve Outcome: Progressing   "

## 2025-01-09 NOTE — Plan of Care (Signed)
 Patient lying in bed, no apparent distress. Comfortably watching television. Aphasic but able to respond and follow simple commands. Continues to refuse SCDs, education provided repeatedly regarding importance of use. Family came to visit.   Problem: Education: Goal: Knowledge of disease or condition will improve Outcome: Progressing   Problem: Ischemic Stroke/TIA Tissue Perfusion: Goal: Complications of ischemic stroke/TIA will be minimized Outcome: Progressing   Problem: Coping: Goal: Will verbalize positive feelings about self Outcome: Progressing   Problem: Self-Care: Goal: Ability to participate in self-care as condition permits will improve Outcome: Progressing   Problem: Urinary Elimination: Goal: Will remain free from infection Outcome: Progressing   Problem: Pain Managment: Goal: General experience of comfort will improve and/or be controlled Outcome: Progressing   Problem: Skin Integrity: Goal: Risk for impaired skin integrity will decrease Outcome: Progressing

## 2025-01-10 DIAGNOSIS — I63232 Cerebral infarction due to unspecified occlusion or stenosis of left carotid arteries: Secondary | ICD-10-CM | POA: Diagnosis not present

## 2025-01-10 DIAGNOSIS — G936 Cerebral edema: Secondary | ICD-10-CM | POA: Diagnosis not present

## 2025-01-10 DIAGNOSIS — R233 Spontaneous ecchymoses: Secondary | ICD-10-CM | POA: Diagnosis not present

## 2025-01-10 DIAGNOSIS — I10 Essential (primary) hypertension: Secondary | ICD-10-CM | POA: Diagnosis not present

## 2025-01-10 LAB — BASIC METABOLIC PANEL WITH GFR
Anion gap: 11 (ref 5–15)
BUN: 21 mg/dL — ABNORMAL HIGH (ref 6–20)
CO2: 24 mmol/L (ref 22–32)
Calcium: 9.4 mg/dL (ref 8.9–10.3)
Chloride: 101 mmol/L (ref 98–111)
Creatinine, Ser: 1.18 mg/dL (ref 0.61–1.24)
GFR, Estimated: >60 mL/min
Glucose, Bld: 107 mg/dL — ABNORMAL HIGH (ref 70–99)
Potassium: 4.5 mmol/L (ref 3.5–5.1)
Sodium: 136 mmol/L (ref 135–145)

## 2025-01-10 LAB — CBC
HCT: 36.8 % — ABNORMAL LOW (ref 39.0–52.0)
Hemoglobin: 12.9 g/dL — ABNORMAL LOW (ref 13.0–17.0)
MCH: 34.2 pg — ABNORMAL HIGH (ref 26.0–34.0)
MCHC: 35.1 g/dL (ref 30.0–36.0)
MCV: 97.6 fL (ref 80.0–100.0)
Platelets: 221 10*3/uL (ref 150–400)
RBC: 3.77 MIL/uL — ABNORMAL LOW (ref 4.22–5.81)
RDW: 12.9 % (ref 11.5–15.5)
WBC: 9.7 10*3/uL (ref 4.0–10.5)
nRBC: 0 % (ref 0.0–0.2)

## 2025-01-10 LAB — MAGNESIUM: Magnesium: 2.1 mg/dL (ref 1.7–2.4)

## 2025-01-10 LAB — PHOSPHORUS: Phosphorus: 3.4 mg/dL (ref 2.5–4.6)

## 2025-01-10 MED ORDER — NYSTATIN 100000 UNIT/ML MT SUSP
5.0000 mL | Freq: Four times a day (QID) | OROMUCOSAL | Status: AC
  Administered 2025-01-10 – 2025-01-22 (×45): 500000 [IU] via ORAL
  Filled 2025-01-10 (×47): qty 5

## 2025-01-10 NOTE — Care Management (Signed)
 SDOH resources added to AVS

## 2025-01-10 NOTE — Progress Notes (Addendum)
 B STROKE TEAM PROGRESS NOTE    SIGNIFICANT HOSPITAL EVENTS 1/11: Roy [name] admitted with aphasia and right-sided weakness, given TNK and mechanical thrombectomy performed.  INTERIM HISTORY/SUBJECTIVE No family at the bedside.  Roy [name] laying in bed in no apparent distress.  No new neurological events overnight.  Neurological exam remains stable and unchanged labs and vitals are stable Roy [name] is medically stable for discharge to SNF however still waiting for SNF bed availability . CBC    Component Value Date/Time   WBC 9.7 01/10/2025 0734   RBC 3.77 (L) 01/10/2025 0734   HGB 12.9 (L) 01/10/2025 0734   HGB 14.4 01/14/2024 1043   HCT 36.8 (L) 01/10/2025 0734   HCT 42.6 01/14/2024 1043   PLT 221 01/10/2025 0734   PLT 220 01/14/2024 1043   MCV 97.6 01/10/2025 0734   MCV 101 (H) 01/14/2024 1043   MCV 99 01/06/2015 1505   MCH 34.2 (H) 01/10/2025 0734   MCHC 35.1 01/10/2025 0734   RDW 12.9 01/10/2025 0734   RDW 11.9 01/14/2024 1043   RDW 13.0 01/06/2015 1505   LYMPHSABS 0.9 12/27/2024 0705   LYMPHSABS 1.6 01/14/2024 1043   LYMPHSABS 2.6 01/06/2015 1505   MONOABS 0.4 12/27/2024 0705   MONOABS 0.3 01/06/2015 1505   EOSABS 0.0 12/27/2024 0705   EOSABS 0.1 01/14/2024 1043   EOSABS 0.1 01/06/2015 1505   BASOSABS 0.0 12/27/2024 0705   BASOSABS 0.0 01/14/2024 1043   BASOSABS 0.0 01/06/2015 1505    BMET    Component Value Date/Time   NA 137 01/08/2025 2338   NA 141 01/06/2015 1505   K 4.2 01/08/2025 2338   K 3.9 01/06/2015 1505   CL 102 01/08/2025 2338   CL 107 01/06/2015 1505   CO2 24 01/08/2025 2338   CO2 29 01/06/2015 1505   GLUCOSE 91 01/08/2025 2338   GLUCOSE 111 (H) 01/06/2015 1505   BUN 23 (H) 01/08/2025 2338   BUN 16 01/06/2015 1505   CREATININE 1.20 01/08/2025 2338   CREATININE 1.28 01/06/2015 1505   CALCIUM  8.9 01/08/2025 2338   CALCIUM  9.0 01/06/2015 1505   GFRNONAA >60 01/08/2025 2338   GFRNONAA >60 01/06/2015 1505   GFRNONAA >60 05/10/2013 0535    IMAGING  past 24 hours No results found.   Vitals:   01/09/25 1542 01/09/25 1958 01/09/25 2028 01/10/25 0614  BP: 121/71 138/66 122/72 115/75  Pulse: (!) 43 (!) 44 (!) 47 (!) 53  Resp: 17 18 17 16   Temp: 98.3 F (36.8 C) 98.7 F (37.1 C) 97.7 F (36.5 C) 99.3 F (37.4 C)  TempSrc: Oral Oral  Oral  SpO2: 100% 97% 100% 97%  Weight:      Height:         PHYSICAL EXAM General:  Alert, well-nourished, well-developed Roy [name] in no acute distress Psych:  Mood and affect appropriate for situation CV: Regular rate and rhythm on monitor Respiratory:  Regular, unlabored respirations on room air   NEURO:  Mental Status: Roy [name] remains globally aphasic but responds to name and intermittently follows simple commands.  Roy [name] will mimic Speech/Language: No verbal output, some vocalizations but no recognizable words  Cranial Nerves:  II: PERRL.  III, IV, VI: Left gaze deviation, able to go to midline VII: Face is symmetrical resting  VIII: hearing intact to voice. XII: tongue is midline without fasciculations. Motor: Able to move left upper and lower extremities with antigravity strength, RUE some movement against gravity can lift his forearm up, moves right lower extremity horizontal movement but  also does not break gravity Tone: is normal and bulk is normal Sensation- Intact to light touch bilaterally.  Coordination: Unable to perform Gait- deferred    ASSESSMENT/PLAN  Roy [name] is a 59 y.o. male with history of HIV, hypertension, substance abuse and alcohol abuse admitted for acute onset right-sided weakness and aphasia.  Roy [name] was given TNK to treat stroke and was found to have occlusion of left ICA/MCA, and mechanical thrombectomy was successfully performed.  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 s/p TNK and IR with TICI 3, etiology: Likely large vessel occlusion in the setting of multiple risk factors Code Stroke CT head  hyperdense left MCA ASPECTS 7 CTA head & neck left ICA occlusion in the neck, left MCA origin occluded MRI acute left MCA territory infarct with confluent petechial hemorrhage, cytotoxic edema and mild regional mass effect with trace rightward midline shift 2D Echo EF 50 to 55% Will need 30-day monitor at discharge LDL 55 HgbA1c 5.8 UDS positive for cocaine, benzo and fentanyl  VTE prophylaxis - heparin  subcu No antithrombotic prior to admission, now on aspirin  81 mg daily due to petechial hemorrhage Therapy recommendations:  CIR Disposition: Pending  Hypertension Home meds: Losartan  100 mg daily Stable Long-term BP goal normotensive  Hyperlipidemia Home meds: Rosuvastatin  20 mg daily, resumed in hospital LDL 55, goal < 70 Continue statin at discharge  Tobacco Abuse Roy [name] smokes 0.25 packs per day for 40 years      Ready to quit? N/A Nicotine  replacement therapy provided  Cocaine abuse Roy [name] uses cocaine UDS positive for Cocaine      Ready to quit? N/A TOC consult for cessation placed  Dysphagia Roy [name] has post-stroke dysphagia, SLP consulted Now on dysphagia 1 and thin liquid Aspiration precautions Advance diet as tolerated  Other Stroke Risk Factors Alcohol abuse HIV, on HARRT therapy  Other Active Problems Mild thrombocytopenia, resolved, platelet 137--153--197  Hospital day # 14   Karna Geralds DNP, ACNPC-AG  [facility] I have personally obtained history,examined Roy [name], reviewed notes, independently viewed imaging studies, participated in medical decision making and plan of care.ROS completed by me personally and pertinent positives fully documented  I have made any additions or clarifications directly to the above note. Agree with note above.  Roy [name] neurological exam is stable.  Difficult placement to skilled nursing facility.  Eather Popp, MD Medical Director [facility] Pager: [phone] 01/10/2025 10:54  AM   To contact Stroke Continuity provider, please refer to [email or web address]. After hours, contact General Neurology

## 2025-01-10 NOTE — Plan of Care (Signed)
" °  Problem: Education: Goal: Knowledge of disease or condition will improve Outcome: Progressing Goal: Knowledge of secondary prevention will improve (MUST DOCUMENT ALL) Outcome: Progressing Goal: Knowledge of patient specific risk factors will improve (DELETE if not current risk factor) Outcome: Progressing   Problem: Ischemic Stroke/TIA Tissue Perfusion: Goal: Complications of ischemic stroke/TIA will be minimized Outcome: Progressing   Problem: Nutrition: Goal: Risk of aspiration will decrease Outcome: Progressing Goal: Dietary intake will improve Outcome: Progressing   Problem: Bowel/Gastric: Goal: Gastrointestinal status for postoperative course will improve Outcome: Progressing   Problem: Cardiac: Goal: Ability to maintain an adequate cardiac output Outcome: Progressing Goal: Will show no evidence of cardiac arrhythmias Outcome: Progressing   Problem: Nutritional: Goal: Will attain and maintain optimal nutritional status Outcome: Progressing   "

## 2025-01-10 NOTE — Discharge Instructions (Signed)
 "  [employer]elessness Coordinated Entry Program. If you are experiencing homelessness in [place], [place] , your first point of contact should be Pensions Consultant. You can reach Coordinated Entry by calling [phone] or by emailing [email or web address].  Community access points: [employer] [phone], [place]) every Tuesday from 9am-10am. [facility] ([address] [place]) every Wednesday from 8am-9am.   -The [employer] [phone]) offers several services to local families, as funding allows. The Emergency Assistance Program (EAP), which they administer, provides household goods, free food, clothing, and financial aid to people in need in the [place]  area. The EAP program does have some qualification, and counselors will interview clients for financial assistance by written referral only. Referrals need to be made by the Department of Social Services or by other EAP approved human services agencies or charities in the area.  -[facility] of [employer], which can be reached at [phone], offers emergency assistance programs for those in need of help, such as food, rent assistance, a soup kitchen, shelter, and clothing. They are based in [facility] [place]  but provide a number of services to those that qualify for assistance.   [facility] Department of Social Services may be able to offer temporary financial assistance and cash grants for paying rent and utilities, Help may be provided for local county residents who may be experiencing personal crisis when other resources, including government programs, are not available. Call [phone]  -[facility] [employer] is a [employer] agency, The organization can offer emergency assistance for paying rent, caremark rx, utilities, food,  household products and furniture. They offer extensive emergency and transitional housing for families, children and single women, and also run a Boy's and [employer]. Thrift Shops, Secondary School Teacher, and other aid offered too. [address] [place], [place]  [address], [phone]  -[place] Low Income Energy Assistance Program -- This is offered for [facility] families. The federal government created [employer] Program provides a one-time cash grant payment to help eligible low-income families pay their electric and heating bills. [address] [place], [place]  [address], [phone]  -[place] Emergency Assistance -- A program offers emergency utility and rent funds for greater [place] area residents. The program can also provide counseling and referrals to charities and government programs. Also provides food and a free meal program that serves lunch Mondays - Saturdays and dinner seven days per week to individuals in the community. [address], [place], [place]  [address], [phone]  -[employer] - Offers affordable apartment and housing communities across      [place] and [place]. The low income and seniors can access public housing, rental assistance to qualified applicants, and apply for the section 8 rent subsidy program. Other programs include Chiropractor and Engineer, Maintenance. [address] [place], [place]  [address], dial [phone].  -The [facility] provides transitional housing to veterans and the disabled. Clients will also access other services too, including assistance in applying for Disability, life skills classes, case management, and assistance in finding permanent housing. [address] [place], [place]  [address], call [phone]  -[facility] [place] Transitional Housing through [employer] is for  people who were just evicted or that are formerly homeless. The non-profit will also help then gain self-sufficiency, find a home or apartment  to live in, and also provides information on rent assistance when needed. Phone [phone]  -The [facility] helps low income, elderly, or disabled residents in seven counties in the [place] ([place], [place], [place], [place], [place], [place], [place], and [place]) save energy and reduce their utility bills by improving energy efficiency. Phone [phone].  -[employer] is located in the [place] [facility] in the [employer], [address], [place], [place] [address]. Parking is in the rear of the building. Phone: [phone]   General Email: info@gsohc .org  [facility] provides free housing counseling assistance in locating affordable rental housing or housing with support services for families and individuals in crisis and the chronically homeless. We provide potential resources for other housing needs like utilities. Our trained counselors also work with clients on budgeting and financial literacy in effort to empower them to take control of their financial situations. [employer] collaborates with homeless service providers and other stakeholders as part of the [employer] COC (Continuum of Care). The (COC) is a regional/local planning body that coordinates housing and services funding for homeless families and individuals. The role of [facility] in the COC is through housing counseling to work with people we serve on diversion strategies for those that are at imminent risk of becoming homeless. We also work with the Coordinated Assessment/Entry Specialist who attempts to find temporary solutions and/or connects the people to Housing First, Rapid Re-housing or transitional housing programs. Our Homelessness Prevention Housing Counselors meet  with clients on business days (Monday-Fridays, except scheduled holidays) from 8:30 am to 4:30 pm.  Legal assistance for evictions, foreclosure, and more -If you need free legal advice on civil issues, such as foreclosures, evictions, electronics engineer, government programs, domestic issues and more, Armed Forces Operational Officer Aid of [place]  [facility]) is a associate professor firm that provides free legal services and counsel to lower income people, seniors, disabled, and others, The goal is to ensure everyone has access to justice and fair representation. Call them at [phone].  [facility] for Housing and Community Studies can provide info about obtaining legal assistance with evictions. Phone [phone]. Data Processing Manager  The [employer], [employer]. offers job and dispensing optician. Resources are focused on helping students obtain the skills and experiences that are necessary to compete in today's challenging and tight job market. The non-profit faith-based community action agency offers internship trainings as well as classroom instruction. Classes are tailored to meet the needs of people in the [facility] region. [place], [place] [address], [phone] Foreclosure Prevention/Debt Services Family Services of the [employer] Credit Counseling Service inludes debt and foreclosure prevention programs for local families. This includes money management, financial advice, budget review and development of a written action plan with a pensions consultant to help solve specific individual financial problems. In addition, housing and mortgage counselors can also provide pre- and post-purchase homeownership counseling, default resolution counseling (to prevent foreclosure) and reverse mortgage counseling. A Debt Management Program allows people and families with a high level of credit card or medical debt to consolidate and repay consumer debt and loans to  creditors and rebuild positive credit ratings and scores. Contact ([phone]) D7650557. Community Clinics in [place] -Health Department [facility]: [address] [place], [place], [address]. [phone].  -Health Department [place] [facility]: [address], [place], [address]. [phone].  -[facility] offers medical care through a group of doctors, pharmacies  and other healthcare related agencies that offer services for low income, uninsured adults in [place]. Also offers adult Dental care and assistance with applying for an [employer]. Call [phone].   [facility]. This center provides low-cost health care to those without health insurance. Services offered include an onsite pharmacy. Phone [phone]. [address], [place].  -Medication Assistance Program serves as a link between pharmaceutical companies and patients to provide low cost or free prescription medications. This service is available for residents who meet certain income restrictions and have no insurance coverage. PLEASE CALL [phone] [place]) OR [phone] ([place])  -One Step Further: Materials Engineer, The [employer] Support & Nutrition Program, [place]. Call [phone]/ [phone].  Food Emergency Planning/management Officer -[facility] [facility]-Food Bank: [address], [place] [address]. Phone [phone]  -[facility] Food Pant[facility]: [address], [place], [place] [address]. [phone].  -[place] Food Finder: https://findfood.bargaincontractor.si  FLEEING VI[place]ENCE  -Family Services of the [facility]- 24/7 Crisis line [phone]) -[facility] Family Justice Centers: (430) [phone]) 641-SAFE (260)[phone])  Transportation  -[facility] for an application. No fee for people over the age of 34. P: [phone]. Address: [address], [place], [place] [address]  -Senior Wheels Transportation-Age 88 and over. Limit of 1 ride per week for ambulatory participants. Limit 1 ride per month for non-ambulatory participants needing wheelchair transportation. Contact: ([phone]) [phone] ([place]), [phone] [facility]).  -Access [place]: Available for people with disabilities who are unable to use fixed-route bus service. Fee applies. Contact: [phone] (For application requests)/(336) [phone] (Customer service)/(336) [phone] (I-ride reservation line)/(336) [phone] (Access [place] reservation line)   Building Services Engineer.org  Homeless Day Center  -[facility])   Day Center: M-F 8a-3p [address]  [place], [place] [address] [phone] Services include: laundry, barbering, support groups, case management, phone & computer access, showers, AA/NA mtgs, mental health/substance abuse nurse, job skills class, disability information, VA assistance, spiritual classes, etc.        HOMELESS SHELTERS [employer] Shelter at [employer]- Call [phone] ext. 347 or ext. 336. Located at [address], [place], [place] [address]  [facility] Mens Shelter- Call [phone]. Located at [address] [place] [address] [place] [address].  [facility]- [employer]. Call [phone]. Office located at [address], [place] [address].  [facility] Family [facility] through [place] [phone].  [facility]- Call [phone]. Located at [address] [place], [place], [place] [address].  [facility] the [facility]-For Pregnant mothers. Call [phone]. Located at [address] [place], [address].  [facility]-For men in [place]. Call [phone].  Home of [employer] for [employer] [phone]. Office located at [address] [place], [address].  [employer] be agreeable to help with chores. Call [phone] ext. 5000.  Men's: [address] [place], [place] [address]. Women's: [facility]  [address] [place], [place] [address]   Crisis Services [facility] Mobile Crisis Management- [phone]  [facility] [address] [place], [place] [address]. Phone: [phone]   *[place] 2-1-1 is another useful way to locate resources in the community. Visit shedsizes.ch to find service information online. If you need additional assistance, 2-1-1 Referral Specialists are available 24 hours a day, every day by dialing 2-1-1 or  [phone] from any phone. The call is free, confidential, and available in any language. "

## 2025-01-10 NOTE — Plan of Care (Signed)
 " Problem: Education: Goal: Knowledge of disease or condition will improve Outcome: Progressing Goal: Knowledge of secondary prevention will improve (MUST DOCUMENT ALL) Outcome: Progressing Goal: Knowledge of patient specific risk factors will improve (DELETE if not current risk factor) Outcome: Progressing   Problem: Ischemic Stroke/TIA Tissue Perfusion: Goal: Complications of ischemic stroke/TIA will be minimized Outcome: Progressing   Problem: Coping: Goal: Will verbalize positive feelings about self Outcome: Progressing Goal: Will identify appropriate support needs Outcome: Progressing   Problem: Health Behavior/Discharge Planning: Goal: Ability to manage health-related needs will improve Outcome: Progressing Goal: Goals will be collaboratively established with patient/family Outcome: Progressing   Problem: Self-Care: Goal: Ability to participate in self-care as condition permits will improve Outcome: Progressing Goal: Verbalization of feelings and concerns over difficulty with self-care will improve Outcome: Progressing Goal: Ability to communicate needs accurately will improve Outcome: Progressing   Problem: Nutrition: Goal: Risk of aspiration will decrease Outcome: Progressing Goal: Dietary intake will improve Outcome: Progressing   Problem: Education: Goal: Knowledge of the prescribed therapeutic regimen will improve Outcome: Progressing   Problem: Bowel/Gastric: Goal: Gastrointestinal status for postoperative course will improve Outcome: Progressing   Problem: Cardiac: Goal: Ability to maintain an adequate cardiac output Outcome: Progressing Goal: Will show no evidence of cardiac arrhythmias Outcome: Progressing   Problem: Nutritional: Goal: Will attain and maintain optimal nutritional status Outcome: Progressing   Problem: Neurological: Goal: Will regain or maintain usual level of consciousness Outcome: Progressing   Problem: Clinical  Measurements: Goal: Ability to maintain clinical measurements within normal limits Outcome: Progressing Goal: Postoperative complications will be avoided or minimized Outcome: Progressing   Problem: Respiratory: Goal: Respiratory status will improve Outcome: Progressing   Problem: Skin Integrity: Goal: Demonstrates signs of wound healing without infection Outcome: Progressing   Problem: Urinary Elimination: Goal: Will remain free from infection Outcome: Progressing Goal: Ability to achieve and maintain adequate urine output Outcome: Progressing   Problem: Education: Goal: Knowledge of General Education information will improve Description: Including pain rating scale, medication(s)/side effects and non-pharmacologic comfort measures Outcome: Progressing   Problem: Health Behavior/Discharge Planning: Goal: Ability to manage health-related needs will improve Outcome: Progressing   Problem: Clinical Measurements: Goal: Ability to maintain clinical measurements within normal limits will improve Outcome: Progressing Goal: Will remain free from infection Outcome: Progressing Goal: Diagnostic test results will improve Outcome: Progressing Goal: Respiratory complications will improve Outcome: Progressing Goal: Cardiovascular complication will be avoided Outcome: Progressing   Problem: Activity: Goal: Risk for activity intolerance will decrease Outcome: Progressing   Problem: Nutrition: Goal: Adequate nutrition will be maintained Outcome: Progressing   Problem: Coping: Goal: Level of anxiety will decrease Outcome: Progressing   Problem: Elimination: Goal: Will not experience complications related to bowel motility Outcome: Progressing Goal: Will not experience complications related to urinary retention Outcome: Progressing   Problem: Pain Managment: Goal: General experience of comfort will improve and/or be controlled Outcome: Progressing   Problem:  Safety: Goal: Ability to remain free from injury will improve Outcome: Progressing   Problem: Skin Integrity: Goal: Risk for impaired skin integrity will decrease Outcome: Progressing   Problem: Education: Goal: Understanding of CV disease, CV risk reduction, and recovery process will improve Outcome: Progressing Goal: Individualized Educational Video(s) Outcome: Progressing   Problem: Activity: Goal: Ability to return to baseline activity level will improve Outcome: Progressing   Problem: Cardiovascular: Goal: Ability to achieve and maintain adequate cardiovascular perfusion will improve Outcome: Progressing Goal: Vascular access site(s) Level 0-1 will be maintained Outcome: Progressing   Problem: Health Behavior/Discharge  Planning: Goal: Ability to safely manage health-related needs after discharge will improve Outcome: Progressing   "

## 2025-01-11 ENCOUNTER — Encounter (HOSPITAL_COMMUNITY): Payer: Self-pay

## 2025-01-11 DIAGNOSIS — I10 Essential (primary) hypertension: Secondary | ICD-10-CM | POA: Diagnosis not present

## 2025-01-11 DIAGNOSIS — I63232 Cerebral infarction due to unspecified occlusion or stenosis of left carotid arteries: Secondary | ICD-10-CM | POA: Diagnosis not present

## 2025-01-11 DIAGNOSIS — R233 Spontaneous ecchymoses: Secondary | ICD-10-CM | POA: Diagnosis not present

## 2025-01-11 DIAGNOSIS — G936 Cerebral edema: Secondary | ICD-10-CM | POA: Diagnosis not present

## 2025-01-11 NOTE — Plan of Care (Signed)
" °  Problem: Education: Goal: Knowledge of disease or condition will improve Outcome: Progressing Goal: Knowledge of secondary prevention will improve (MUST DOCUMENT ALL) Outcome: Progressing Goal: Knowledge of patient specific risk factors will improve (DELETE if not current risk factor) Outcome: Progressing   Problem: Ischemic Stroke/TIA Tissue Perfusion: Goal: Complications of ischemic stroke/TIA will be minimized Outcome: Progressing   Problem: Coping: Goal: Will verbalize positive feelings about self Outcome: Progressing   Problem: Clinical Measurements: Goal: Ability to maintain clinical measurements within normal limits will improve Outcome: Progressing Goal: Will remain free from infection Outcome: Progressing Goal: Diagnostic test results will improve Outcome: Progressing Goal: Respiratory complications will improve Outcome: Progressing Goal: Cardiovascular complication will be avoided Outcome: Progressing   "

## 2025-01-11 NOTE — Progress Notes (Addendum)
 B STROKE TEAM PROGRESS NOTE    SIGNIFICANT HOSPITAL EVENTS 1/11: Patient admitted with aphasia and right-sided weakness, given TNK and mechanical thrombectomy performed.  INTERIM HISTORY/SUBJECTIVE  Patient sitting up in bed.  No family at bedside.  Stable neurological exam, labs and vitals.  Medically stable for discharge, pending SNF bed availability.   CBC    Component Value Date/Time   WBC 9.7 01/10/2025 0734   RBC 3.77 (L) 01/10/2025 0734   HGB 12.9 (L) 01/10/2025 0734   HGB 14.4 01/14/2024 1043   HCT 36.8 (L) 01/10/2025 0734   HCT 42.6 01/14/2024 1043   PLT 221 01/10/2025 0734   PLT 220 01/14/2024 1043   MCV 97.6 01/10/2025 0734   MCV 101 (H) 01/14/2024 1043   MCV 99 01/06/2015 1505   MCH 34.2 (H) 01/10/2025 0734   MCHC 35.1 01/10/2025 0734   RDW 12.9 01/10/2025 0734   RDW 11.9 01/14/2024 1043   RDW 13.0 01/06/2015 1505   LYMPHSABS 0.9 12/27/2024 0705   LYMPHSABS 1.6 01/14/2024 1043   LYMPHSABS 2.6 01/06/2015 1505   MONOABS 0.4 12/27/2024 0705   MONOABS 0.3 01/06/2015 1505   EOSABS 0.0 12/27/2024 0705   EOSABS 0.1 01/14/2024 1043   EOSABS 0.1 01/06/2015 1505   BASOSABS 0.0 12/27/2024 0705   BASOSABS 0.0 01/14/2024 1043   BASOSABS 0.0 01/06/2015 1505    BMET    Component Value Date/Time   NA 136 01/10/2025 0734   NA 141 01/06/2015 1505   K 4.5 01/10/2025 0734   K 3.9 01/06/2015 1505   CL 101 01/10/2025 0734   CL 107 01/06/2015 1505   CO2 24 01/10/2025 0734   CO2 29 01/06/2015 1505   GLUCOSE 107 (H) 01/10/2025 0734   GLUCOSE 111 (H) 01/06/2015 1505   BUN 21 (H) 01/10/2025 0734   BUN 16 01/06/2015 1505   CREATININE 1.18 01/10/2025 0734   CREATININE 1.28 01/06/2015 1505   CALCIUM  9.4 01/10/2025 0734   CALCIUM  9.0 01/06/2015 1505   GFRNONAA >60 01/10/2025 0734   GFRNONAA >60 01/06/2015 1505   GFRNONAA >60 05/10/2013 0535    IMAGING past 24 hours No results found.   Vitals:   01/11/25 0036 01/11/25 0422 01/11/25 0754 01/11/25 1120  BP: 123/71  (P) 116/74 120/74 117/78  Pulse: (!) 50 (!) (P) 50 (!) 51 71  Resp: 16 (P) 16 18 16   Temp: 99.9 F (37.7 C) (P) 99.4 F (37.4 C) 99 F (37.2 C) 98.8 F (37.1 C)  TempSrc: Oral (P) Oral Oral Oral  SpO2: 98% (P) 100% 96% 98%  Weight:      Height:        PHYSICAL EXAM General:  Alert, well-nourished, well-developed patient in no acute distress Psych:  Flat affect  NEURO:  Mental Status: Patient follows simple commands, nods appropriately.  Speech/Language: No verbal output  Cranial Nerves:  II: PERRL.  III, IV, VI: Left gaze preference, crosses midline VII: Face is symmetrical resting  VIII: hearing intact to voice. XII: tongue is midline without fasciculations. Motor: Able to move left upper and lower extremities with antigravity strength, RUE some movement against gravity can lift his forearm up, able to lift RLE off bed slightly.  Tone: is normal and bulk is normal Sensation- Intact to light touch bilaterally.  Coordination: Unable to perform Gait- deferred   ASSESSMENT/PLAN  [name] is a 59 y.o. male with history of HIV, hypertension, substance abuse and alcohol abuse admitted for acute onset right-sided weakness and aphasia.  He was given TNK to treat stroke and was found to have occlusion of left ICA/MCA, and mechanical thrombectomy was successfully performed.  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 s/p TNK and IR with TICI 3, etiology: Likely large vessel occlusion in the setting of multiple risk factors Code Stroke CT head hyperdense left MCA ASPECTS 7 CTA head & neck left ICA occlusion in the neck, left MCA origin occluded MRI acute left MCA territory infarct with confluent petechial hemorrhage, cytotoxic edema and mild regional mass effect with trace rightward midline shift 2D Echo EF 50 to 55% Will need 30-day monitor at discharge LDL 55 HgbA1c 5.8 UDS positive for cocaine, benzo and  fentanyl  VTE prophylaxis - heparin  subcu No antithrombotic prior to admission, continue aspirin  81 mg daily due to petechial hemorrhage Therapy recommendations:  CIR Disposition: Pending  Hypertension Home meds: Losartan  100 mg daily Stable Long-term BP goal normotensive  Hyperlipidemia Home meds: Rosuvastatin  20 mg daily, resumed in hospital LDL 55, goal < 70 Continue statin at discharge  Tobacco Abuse Patient smokes 0.25 packs per day for 40 years      Ready to quit? N/A Nicotine  replacement therapy provided  Cocaine abuse Patient uses cocaine UDS positive for Cocaine      Ready to quit? N/A TOC consult for cessation placed  Dysphagia Patient has post-stroke dysphagia, SLP consulted Now on dysphagia 1 and thin liquid Aspiration precautions Advance diet as tolerated  Other Stroke Risk Factors Alcohol abuse HIV, on HARRT therapy  Other Active Problems Mild thrombocytopenia, resolved, platelet [phone]  Hospital day # 15   Pt seen by Neuro NP/APP with MD. Note/plan to be edited by MD as needed.    Rocky JAYSON Likes, DNP [facility] Please use AMION for contact information & EPIC for messaging. I have personally obtained history,examined this patient, reviewed notes, independently viewed imaging studies, participated in medical decision making and plan of care.ROS completed by me personally and pertinent positives fully documented  I have made any additions or clarifications directly to the above note. Agree with note above.  Patient is difficult to place with nursing home but  Eather Popp, MD Medical Director [facility] Pager: [phone] 01/11/2025 1:35 PM

## 2025-01-11 NOTE — Op Note (Signed)
 IR Stroke  Date of Surgery: 12/27/2024   Surgeon: Dino CHRISTELLA Sable, MD  Narrative & Impression TECHNIQUE: Mechanical Thrombectomy for Stroke   PREOPERATIVE DIAGNOSIS: Stroke - LEFT ICA/MCA Occlusion POSTOPERATIVE DIAGNOSIS: Same   PROCEDURE PERFORMED:  1: Ultrasound-guided right femoral artery access 2: Multivessel diagnostic angiography 3: Closure of the right femoral artery access with closure device 4: Mechanical thrombectomy - LEFT ICA/MCA with Aspiration only. Passes: 1   DEVICES USED:  1: 6 French sheath 2: Simmons 2 diagnostic catheter 3: 0.038 inch Glidewire 4: Closure with: Angioseal device 5: Neuro Max base catheter 6: Microcatheter 7: Synchro 14 microwire 8: Stent retriever   VESSELS CATHETERIZED: 1: Right common femoral artery 2: Aortic Arch 3: LEFT Carotid Artery 4: LEFT Middle Cerebral Artery   ANESTHESIA: Per anesthesia service   COMPLICATIONS: None noted during the procedure.   PREOPERATIVE COURSE: Patient is a 59 year old gentleman with a history of an acute neurological deficit was seen through our emergency services. [name] films could not be discussed with them nor the family and an emergent consent was obtained and we decided to proceed with endovascular treatment.   DESCRIPTION OF PROCEDURE: Patient was brought to the angio suite and after patient was positioned and all pressure points padded, patient was prepped and draped in usual sterile fashion. Ultrasound guidance was used to evaluate the right femoral site; patency of the right femoral artery was noted and documented in PACS. Using a standard micropuncture kit with ultrasound guidance realtime visualization, the micropuncture needle was advanced into the right femoral artery and intravascular location of the needle tip was confirmed on ultrasound. Thereafter using a modified Seldinger technique a 6 French sheath was inserted. The Simmons 2 was navigated over the arch and vessels were selectively  catheterized over a 0.038 inch Glidewire.  Over an exchange wire the guide catheter was navigated to the distal cervical internal carotid artery.  Through this, over a microwire microcatheter the aspiration catheter was navigated into the thrombus and with aspiration thrombectomy recanalization was obtained.  The number of attempts made were: 1. After the images were obtained they were reviewed and found to be of diagnostic quality. The catheter and wire were then removed and the sheath discontinued with closure of the access site with Angio-Seal closure device At the end of the procedure patient's needle pulses were present. Patient was transferred out of the angiosuite at neurological baseline   I was present for the entire procedure.   INTERPRETATION: After getting access in his singlewall puncture in the first attempt, I was able to navigate a sheath through which I navigated the [facility] 2 diagnostic catheter and over an Amplatz exchange wire navigated the base catheter into the carotid artery after this over a microcatheter microwire and navigated the aspiration catheter into the M1 and aspirated the clot and was able to get complete recanalization.  The sheath was then removed and closed with a closure device.      IMPRESSION: LICA/MCA Occlusion. TICI 3

## 2025-01-11 NOTE — Plan of Care (Signed)
 " Problem: Education: Goal: Knowledge of disease or condition will improve Outcome: Progressing Goal: Knowledge of secondary prevention will improve (MUST DOCUMENT ALL) Outcome: Progressing Goal: Knowledge of patient specific risk factors will improve (DELETE if not current risk factor) Outcome: Progressing   Problem: Ischemic Stroke/TIA Tissue Perfusion: Goal: Complications of ischemic stroke/TIA will be minimized Outcome: Progressing   Problem: Coping: Goal: Will verbalize positive feelings about self Outcome: Progressing Goal: Will identify appropriate support needs Outcome: Progressing   Problem: Health Behavior/Discharge Planning: Goal: Ability to manage health-related needs will improve Outcome: Progressing Goal: Goals will be collaboratively established with patient/family Outcome: Progressing   Problem: Self-Care: Goal: Ability to participate in self-care as condition permits will improve Outcome: Progressing Goal: Verbalization of feelings and concerns over difficulty with self-care will improve Outcome: Progressing Goal: Ability to communicate needs accurately will improve Outcome: Progressing   Problem: Nutrition: Goal: Risk of aspiration will decrease Outcome: Progressing Goal: Dietary intake will improve Outcome: Progressing   Problem: Education: Goal: Knowledge of the prescribed therapeutic regimen will improve Outcome: Progressing   Problem: Bowel/Gastric: Goal: Gastrointestinal status for postoperative course will improve Outcome: Progressing   Problem: Cardiac: Goal: Ability to maintain an adequate cardiac output Outcome: Progressing Goal: Will show no evidence of cardiac arrhythmias Outcome: Progressing   Problem: Nutritional: Goal: Will attain and maintain optimal nutritional status Outcome: Progressing   Problem: Neurological: Goal: Will regain or maintain usual level of consciousness Outcome: Progressing   Problem: Clinical  Measurements: Goal: Ability to maintain clinical measurements within normal limits Outcome: Progressing Goal: Postoperative complications will be avoided or minimized Outcome: Progressing   Problem: Respiratory: Goal: Respiratory status will improve Outcome: Progressing   Problem: Skin Integrity: Goal: Demonstrates signs of wound healing without infection Outcome: Progressing   Problem: Urinary Elimination: Goal: Will remain free from infection Outcome: Progressing Goal: Ability to achieve and maintain adequate urine output Outcome: Progressing   Problem: Education: Goal: Knowledge of General Education information will improve Description: Including pain rating scale, medication(s)/side effects and non-pharmacologic comfort measures Outcome: Progressing   Problem: Health Behavior/Discharge Planning: Goal: Ability to manage health-related needs will improve Outcome: Progressing   Problem: Clinical Measurements: Goal: Ability to maintain clinical measurements within normal limits will improve Outcome: Progressing Goal: Will remain free from infection Outcome: Progressing Goal: Diagnostic test results will improve Outcome: Progressing Goal: Respiratory complications will improve Outcome: Progressing Goal: Cardiovascular complication will be avoided Outcome: Progressing   Problem: Activity: Goal: Risk for activity intolerance will decrease Outcome: Progressing   Problem: Nutrition: Goal: Adequate nutrition will be maintained Outcome: Progressing   Problem: Coping: Goal: Level of anxiety will decrease Outcome: Progressing   Problem: Elimination: Goal: Will not experience complications related to bowel motility Outcome: Progressing Goal: Will not experience complications related to urinary retention Outcome: Progressing   Problem: Pain Managment: Goal: General experience of comfort will improve and/or be controlled Outcome: Progressing   Problem:  Safety: Goal: Ability to remain free from injury will improve Outcome: Progressing   Problem: Skin Integrity: Goal: Risk for impaired skin integrity will decrease Outcome: Progressing   Problem: Education: Goal: Understanding of CV disease, CV risk reduction, and recovery process will improve Outcome: Progressing Goal: Individualized Educational Video(s) Outcome: Progressing   Problem: Activity: Goal: Ability to return to baseline activity level will improve Outcome: Progressing   Problem: Cardiovascular: Goal: Ability to achieve and maintain adequate cardiovascular perfusion will improve Outcome: Progressing Goal: Vascular access site(s) Level 0-1 will be maintained Outcome: Progressing   Problem: Health Behavior/Discharge  Planning: Goal: Ability to safely manage health-related needs after discharge will improve Outcome: Progressing   "

## 2025-01-11 NOTE — Progress Notes (Signed)
 PT Cancellation Note  Patient Details Name: Roy [name] MRN: [id number] DOB: [birth year]   Cancelled Treatment:    Reason Eval/Treat Not Completed: Patient declined, no reason specified  Patient repeatedly shaking head no to getting OOB and working with therapy. Due to aphasia he is unable to explain why. Will continue efforts.   Macario RAMAN, PT Acute Rehabilitation Services  Office [phone]  Macario SHAUNNA Soja 01/11/2025, 12:59 PM

## 2025-01-12 DIAGNOSIS — I69391 Dysphagia following cerebral infarction: Secondary | ICD-10-CM | POA: Diagnosis not present

## 2025-01-12 DIAGNOSIS — B20 Human immunodeficiency virus [HIV] disease: Secondary | ICD-10-CM | POA: Diagnosis not present

## 2025-01-12 DIAGNOSIS — R233 Spontaneous ecchymoses: Secondary | ICD-10-CM | POA: Diagnosis not present

## 2025-01-12 DIAGNOSIS — E785 Hyperlipidemia, unspecified: Secondary | ICD-10-CM | POA: Diagnosis not present

## 2025-01-12 DIAGNOSIS — F17210 Nicotine dependence, cigarettes, uncomplicated: Secondary | ICD-10-CM | POA: Diagnosis not present

## 2025-01-12 DIAGNOSIS — F1410 Cocaine abuse, uncomplicated: Secondary | ICD-10-CM | POA: Diagnosis not present

## 2025-01-12 DIAGNOSIS — I10 Essential (primary) hypertension: Secondary | ICD-10-CM | POA: Diagnosis not present

## 2025-01-12 DIAGNOSIS — I63232 Cerebral infarction due to unspecified occlusion or stenosis of left carotid arteries: Secondary | ICD-10-CM | POA: Diagnosis not present

## 2025-01-12 DIAGNOSIS — F1010 Alcohol abuse, uncomplicated: Secondary | ICD-10-CM | POA: Diagnosis not present

## 2025-01-12 LAB — GLUCOSE, CAPILLARY: Glucose-Capillary: 85 mg/dL (ref 70–99)

## 2025-01-12 NOTE — Progress Notes (Signed)
 B STROKE TEAM PROGRESS NOTE    SIGNIFICANT HOSPITAL EVENTS 1/11: Patient admitted with aphasia and right-sided weakness, given TNK and mechanical thrombectomy performed.  INTERIM HISTORY/SUBJECTIVE  Patient sitting up in bed.  No family at bedside.  Stable neurological exam with expressive aphasia and mild right hemiparesis,.  Medically stable for discharge, pending SNF bed availability.   CBC    Component Value Date/Time   WBC 9.7 01/10/2025 0734   RBC 3.77 (L) 01/10/2025 0734   HGB 12.9 (L) 01/10/2025 0734   HGB 14.4 01/14/2024 1043   HCT 36.8 (L) 01/10/2025 0734   HCT 42.6 01/14/2024 1043   PLT 221 01/10/2025 0734   PLT 220 01/14/2024 1043   MCV 97.6 01/10/2025 0734   MCV 101 (H) 01/14/2024 1043   MCV 99 01/06/2015 1505   MCH 34.2 (H) 01/10/2025 0734   MCHC 35.1 01/10/2025 0734   RDW 12.9 01/10/2025 0734   RDW 11.9 01/14/2024 1043   RDW 13.0 01/06/2015 1505   LYMPHSABS 0.9 12/27/2024 0705   LYMPHSABS 1.6 01/14/2024 1043   LYMPHSABS 2.6 01/06/2015 1505   MONOABS 0.4 12/27/2024 0705   MONOABS 0.3 01/06/2015 1505   EOSABS 0.0 12/27/2024 0705   EOSABS 0.1 01/14/2024 1043   EOSABS 0.1 01/06/2015 1505   BASOSABS 0.0 12/27/2024 0705   BASOSABS 0.0 01/14/2024 1043   BASOSABS 0.0 01/06/2015 1505    BMET    Component Value Date/Time   NA 136 01/10/2025 0734   NA 141 01/06/2015 1505   K 4.5 01/10/2025 0734   K 3.9 01/06/2015 1505   CL 101 01/10/2025 0734   CL 107 01/06/2015 1505   CO2 24 01/10/2025 0734   CO2 29 01/06/2015 1505   GLUCOSE 107 (H) 01/10/2025 0734   GLUCOSE 111 (H) 01/06/2015 1505   BUN 21 (H) 01/10/2025 0734   BUN 16 01/06/2015 1505   CREATININE 1.18 01/10/2025 0734   CREATININE 1.28 01/06/2015 1505   CALCIUM  9.4 01/10/2025 0734   CALCIUM  9.0 01/06/2015 1505   GFRNONAA >60 01/10/2025 0734   GFRNONAA >60 01/06/2015 1505   GFRNONAA >60 05/10/2013 0535    IMAGING past 24 hours No results found.   Vitals:   01/11/25 1935 01/12/25 0134  01/12/25 0553 01/12/25 0759  BP: 123/69 122/84 135/77 114/69  Pulse: 71 (!) 55 (!) 56 (!) 44  Resp:  20 17 15   Temp: 98.7 F (37.1 C) 98.9 F (37.2 C) 98.9 F (37.2 C) 98.8 F (37.1 C)  TempSrc: Oral Oral Oral Oral  SpO2: 96% 97% 98% 100%  Weight:      Height:        PHYSICAL EXAM General:  Alert, well-nourished, well-developed patient in no acute distress Psych:  Flat affect  NEURO:  Mental Status: Patient follows simple commands, nods appropriately.  Speech/Language: No verbal output  Cranial Nerves:  II: PERRL.  III, IV, VI: Left gaze preference, crosses midline VII: Face is symmetrical resting  VIII: hearing intact to voice. XII: tongue is midline without fasciculations. Motor: Able to move left upper and lower extremities with antigravity strength, RUE some movement against gravity can lift his forearm up, able to lift RLE off bed slightly.  Tone: is normal and bulk is normal Sensation- Intact to light touch bilaterally.  Coordination: Unable to perform Gait- deferred   ASSESSMENT/PLAN  [name] is a 59 y.o. male with history of HIV, hypertension, substance abuse and alcohol abuse admitted for acute onset right-sided weakness and aphasia.  He was  given TNK to treat stroke and was found to have occlusion of left ICA/MCA, and mechanical thrombectomy was successfully performed.  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 s/p TNK and IR with TICI 3, etiology: Likely large vessel occlusion in the setting of multiple risk factors Code Stroke CT head hyperdense left MCA ASPECTS 7 CTA head & neck left ICA occlusion in the neck, left MCA origin occluded MRI acute left MCA territory infarct with confluent petechial hemorrhage, cytotoxic edema and mild regional mass effect with trace rightward midline shift 2D Echo EF 50 to 55% Will need 30-day monitor at discharge LDL 55 HgbA1c 5.8 UDS positive for cocaine,  benzo and fentanyl  VTE prophylaxis - heparin  subcu No antithrombotic prior to admission, continue aspirin  81 mg daily due to petechial hemorrhage Therapy recommendations:  CIR Disposition: Pending  Hypertension Home meds: Losartan  100 mg daily Stable Long-term BP goal normotensive  Hyperlipidemia Home meds: Rosuvastatin  20 mg daily, resumed in hospital LDL 55, goal < 70 Continue statin at discharge  Tobacco Abuse Patient smokes 0.25 packs per day for 40 years      Ready to quit? N/A Nicotine  replacement therapy provided  Cocaine abuse Patient uses cocaine UDS positive for Cocaine      Ready to quit? N/A TOC consult for cessation placed  Dysphagia Patient has post-stroke dysphagia, SLP consulted Now on dysphagia 1 and thin liquid Aspiration precautions Advance diet as tolerated  Other Stroke Risk Factors Alcohol abuse HIV, on HARRT therapy  Other Active Problems Mild thrombocytopenia, resolved, platelet 137--153--[phone]  Hospital day # 16     Patient is difficult to place with nursing home but medically stable for discharge  Roy Popp, MD Medical Director [facility] Pager: [phone] 01/12/2025 11:47 AM

## 2025-01-12 NOTE — Progress Notes (Signed)
 Occupational Therapy Treatment Patient Details Name: Roy [name] MRN: [id number] DOB: 1966/01/28 Today's Date: 01/12/2025   History of present illness 59 yo M adm 12/27/24 from [facility] rehab center after fall with Rt weakness, AMS. Pt with Lt ICA/MCA CVA s/p Lt ICA/MCA thrombectomy complicated by Rt CFA occlusion from closure device requiring exploration and endarterectomy with angioplasty. Negative pressure dressing Rt groin  PMHx: cocaine & ETOH abuse, HIV, bipolar disorder, CAD   OT comments  Pt initially declining therapy session but with encouragement, pt agreeable to try. Pt able to mobilize in room using hemiwalker with Mod A x 2 w/ assist to support RUE and advance RLE. R LE wrapped to promote dorsiflexion with some improvements noted w/ gait trials. Pt able to stand briefly at sink for one handed UB grooming tasks with CGA. Pt declined to sit up in chair and opted for return to bed citing the recliner was uncomfortable. Patient will benefit from continued inpatient follow up therapy, <3 hours/day upon DC.      If plan is discharge home, recommend the following:  A lot of help with walking and/or transfers;A lot of help with bathing/dressing/bathroom;Assistance with cooking/housework;Direct supervision/assist for medications management;Direct supervision/assist for financial management;Assist for transportation;Help with stairs or ramp for entrance   Equipment Recommendations  Other (comment) (TBD)    Recommendations for Other Services      Precautions / Restrictions Precautions Precautions: Fall;Other (comment) Recall of Precautions/Restrictions: Impaired Precaution/Restrictions Comments: foley, aphasia, Rt hemiparesis Restrictions Weight Bearing Restrictions Per Provider Order: No       Mobility Bed Mobility Overal bed mobility: Needs Assistance Bed Mobility: Supine to Sit, Sit to Supine     Supine to sit: Min assist, HOB elevated Sit to supine: Min assist    General bed mobility comments: Min A with use of LUE on side of bed to pull self. Light assist for RLE to EOB and for return back to bed    Transfers Overall transfer level: Needs assistance Equipment used: Hemi-walker Transfers: Sit to/from Stand Sit to Stand: Min assist, +2 safety/equipment           General transfer comment: Min A x 2 present for safety. cues for pushing up from bed surface before reaching for hemiwalker. Cued to push from recliner armrest to stand at sink but pt opted to pull self up with sink counter     Balance Overall balance assessment: Needs assistance Sitting-balance support: Feet supported Sitting balance-Leahy Scale: Fair     Standing balance support: No upper extremity supported, Single extremity supported, During functional activity Standing balance-Leahy Scale: Poor                             ADL either performed or assessed with clinical judgement   ADL Overall ADL's : Needs assistance/impaired     Grooming: Contact guard assist;Standing;Wash/dry face Grooming Details (indicate cue type and reason): standing at sink to wash face, good use of LUE to wring out washcloth.         Upper Body Dressing : Sitting;Minimal assistance Upper Body Dressing Details (indicate cue type and reason): to don gown around back and doff at end of session. cues for when and how to manage affected LUE                 Functional mobility during ADLs: Moderate assistance;+2 for safety/equipment (hemiwalker) General ADL Comments: Emphasis on mobility in room with hemiwalker and ace wrapped  RLE to promote dorsiflexion with ADLs standing at sink and seated EOB. Pt politely declined to stay in recliner at end of session    Extremity/Trunk Assessment Upper Extremity Assessment Upper Extremity Assessment: Right hand dominant;RUE deficits/detail RUE Deficits / Details: noted trace activation in L shoulder during scapular protraction/retraction but  overall flaccid. PROM WFL. Pt reports baseline L thumb injury d/t noted swollen joint RUE Coordination: decreased gross motor;decreased fine motor   Lower Extremity Assessment Lower Extremity Assessment: Defer to PT evaluation        Vision   Vision Assessment?: Vision impaired- to be further tested in functional context Additional Comments: minor difficulty tracking to R side   Perception     Praxis     Communication Communication Communication: Impaired Factors Affecting Communication: Difficulty expressing self   Cognition Arousal: Alert Behavior During Therapy: Flat affect Cognition: Difficult to assess Difficult to assess due to: Impaired communication           OT - Cognition Comments: able to follow simple commands and answer yes/no questions. pt initially required encouragement to participate                 Following commands: Impaired Following commands impaired: Follows one step commands with increased time      Cueing   Cueing Techniques: Verbal cues, Gestural cues, Tactile cues  Exercises      Shoulder Instructions       General Comments      Pertinent Vitals/ Pain       Pain Assessment Pain Assessment: No/denies pain  Home Living                                          Prior Functioning/Environment              Frequency  Min 2X/week        Progress Toward Goals  OT Goals(current goals can now be found in the care plan section)  Progress towards OT goals: Progressing toward goals  Acute Rehab OT Goals Patient Stated Goal: unable to state OT Goal Formulation: Patient unable to participate in goal setting Time For Goal Achievement: 01/26/25 Potential to Achieve Goals: Good  Plan      Co-evaluation    PT/OT/SLP Co-Evaluation/Treatment: Yes Reason for Co-Treatment: Complexity of the patient's impairments (multi-system involvement);For patient/therapist safety PT goals addressed during session:  Mobility/safety with mobility;Balance;Proper use of DME OT goals addressed during session: ADL's and self-care;Strengthening/ROM;Proper use of Adaptive equipment and DME      AM-PAC OT 6 Clicks Daily Activity     Outcome Measure   Help from another person eating meals?: A Little Help from another person taking care of personal grooming?: A Little Help from another person toileting, which includes using toliet, bedpan, or urinal?: A Lot Help from another person bathing (including washing, rinsing, drying)?: A Lot Help from another person to put on and taking off regular upper body clothing?: A Lot Help from another person to put on and taking off regular lower body clothing?: A Lot 6 Click Score: 14    End of Session Equipment Utilized During Treatment: Gait belt;Other (comment) (hemiwalker)  OT Visit Diagnosis: Unsteadiness on feet (R26.81);Other abnormalities of gait and mobility (R26.89);Muscle weakness (generalized) (M62.81);Hemiplegia and hemiparesis;Cognitive communication deficit (R41.841) Symptoms and signs involving cognitive functions: Cerebral infarction Hemiplegia - Right/Left: Right Hemiplegia - dominant/non-dominant: Dominant Hemiplegia - caused  by: Cerebral infarction   Activity Tolerance Patient tolerated treatment well   Patient Left in bed;with call bell/phone within reach;with bed alarm set   Nurse Communication          Time: 8993-8961 OT Time Calculation (min): 32 min  Charges: OT General Charges $OT Visit: 1 Visit OT Treatments $Self Care/Home Management : 8-22 mins  Mliss NOVAK, OTR/L Acute Rehab Services Office: [phone]   Mliss Fish 01/12/2025, 11:47 AM

## 2025-01-12 NOTE — Plan of Care (Signed)
 " Problem: Education: Goal: Knowledge of disease or condition will improve Outcome: Progressing Goal: Knowledge of secondary prevention will improve (MUST DOCUMENT ALL) Outcome: Progressing Goal: Knowledge of patient specific risk factors will improve (DELETE if not current risk factor) Outcome: Progressing   Problem: Ischemic Stroke/TIA Tissue Perfusion: Goal: Complications of ischemic stroke/TIA will be minimized Outcome: Progressing   Problem: Coping: Goal: Will verbalize positive feelings about self Outcome: Progressing Goal: Will identify appropriate support needs Outcome: Progressing   Problem: Health Behavior/Discharge Planning: Goal: Ability to manage health-related needs will improve Outcome: Progressing Goal: Goals will be collaboratively established with patient/family Outcome: Progressing   Problem: Self-Care: Goal: Ability to participate in self-care as condition permits will improve Outcome: Progressing Goal: Verbalization of feelings and concerns over difficulty with self-care will improve Outcome: Progressing Goal: Ability to communicate needs accurately will improve Outcome: Progressing   Problem: Nutrition: Goal: Risk of aspiration will decrease Outcome: Progressing Goal: Dietary intake will improve Outcome: Progressing   Problem: Education: Goal: Knowledge of the prescribed therapeutic regimen will improve Outcome: Progressing   Problem: Bowel/Gastric: Goal: Gastrointestinal status for postoperative course will improve Outcome: Progressing   Problem: Cardiac: Goal: Ability to maintain an adequate cardiac output Outcome: Progressing Goal: Will show no evidence of cardiac arrhythmias Outcome: Progressing   Problem: Nutritional: Goal: Will attain and maintain optimal nutritional status Outcome: Progressing   Problem: Neurological: Goal: Will regain or maintain usual level of consciousness Outcome: Progressing   Problem: Clinical  Measurements: Goal: Ability to maintain clinical measurements within normal limits Outcome: Progressing Goal: Postoperative complications will be avoided or minimized Outcome: Progressing   Problem: Respiratory: Goal: Respiratory status will improve Outcome: Progressing   Problem: Skin Integrity: Goal: Demonstrates signs of wound healing without infection Outcome: Progressing   Problem: Urinary Elimination: Goal: Will remain free from infection Outcome: Progressing Goal: Ability to achieve and maintain adequate urine output Outcome: Progressing   Problem: Education: Goal: Knowledge of General Education information will improve Description: Including pain rating scale, medication(s)/side effects and non-pharmacologic comfort measures Outcome: Progressing   Problem: Health Behavior/Discharge Planning: Goal: Ability to manage health-related needs will improve Outcome: Progressing   Problem: Clinical Measurements: Goal: Ability to maintain clinical measurements within normal limits will improve Outcome: Progressing Goal: Will remain free from infection Outcome: Progressing Goal: Diagnostic test results will improve Outcome: Progressing Goal: Respiratory complications will improve Outcome: Progressing Goal: Cardiovascular complication will be avoided Outcome: Progressing   Problem: Activity: Goal: Risk for activity intolerance will decrease Outcome: Progressing   Problem: Nutrition: Goal: Adequate nutrition will be maintained Outcome: Progressing   Problem: Coping: Goal: Level of anxiety will decrease Outcome: Progressing   Problem: Elimination: Goal: Will not experience complications related to bowel motility Outcome: Progressing Goal: Will not experience complications related to urinary retention Outcome: Progressing   Problem: Pain Managment: Goal: General experience of comfort will improve and/or be controlled Outcome: Progressing   Problem:  Safety: Goal: Ability to remain free from injury will improve Outcome: Progressing   Problem: Skin Integrity: Goal: Risk for impaired skin integrity will decrease Outcome: Progressing   Problem: Education: Goal: Understanding of CV disease, CV risk reduction, and recovery process will improve Outcome: Progressing Goal: Individualized Educational Video(s) Outcome: Progressing   Problem: Activity: Goal: Ability to return to baseline activity level will improve Outcome: Progressing   Problem: Cardiovascular: Goal: Ability to achieve and maintain adequate cardiovascular perfusion will improve Outcome: Progressing Goal: Vascular access site(s) Level 0-1 will be maintained Outcome: Progressing   Problem: Health Behavior/Discharge  Planning: Goal: Ability to safely manage health-related needs after discharge will improve Outcome: Progressing   "

## 2025-01-12 NOTE — Plan of Care (Signed)
 OOB with PT today.  Continue with plan of care.  Problem: Education: Goal: Knowledge of disease or condition will improve Outcome: Progressing Goal: Knowledge of secondary prevention will improve (MUST DOCUMENT ALL) Outcome: Progressing Goal: Knowledge of patient specific risk factors will improve (DELETE if not current risk factor) Outcome: Progressing   Problem: Ischemic Stroke/TIA Tissue Perfusion: Goal: Complications of ischemic stroke/TIA will be minimized Outcome: Progressing   Problem: Coping: Goal: Will verbalize positive feelings about self Outcome: Progressing Goal: Will identify appropriate support needs Outcome: Progressing   Problem: Health Behavior/Discharge Planning: Goal: Ability to manage health-related needs will improve Outcome: Progressing   Problem: Cardiovascular: Goal: Ability to achieve and maintain adequate cardiovascular perfusion will improve Outcome: Progressing Goal: Vascular access site(s) Level 0-1 will be maintained Outcome: Progressing   Problem: Health Behavior/Discharge Planning: Goal: Ability to safely manage health-related needs after discharge will improve Outcome: Progressing

## 2025-01-12 NOTE — Progress Notes (Signed)
 Physical Therapy Treatment Patient Details Name: Roy [name] MRN: [id number] DOB: [birth year] Today's Date: 01/12/2025   History of Present Illness 59 yo M adm 12/27/24 from [facility] rehab center after fall with Rt weakness, AMS. Pt with Lt ICA/MCA CVA s/p Lt ICA/MCA thrombectomy complicated by Rt CFA occlusion from closure device requiring exploration and endarterectomy with angioplasty. Negative pressure dressing Rt groin  PMHx: cocaine & ETOH abuse, HIV, bipolar disorder, CAD    PT Comments  Patient uses head nods/shakes to answer yes/no questions and appears appropriate. Required persuading to work with therapies and acknowledged that he is frustrated/down re: his current circumstances. Focus of session on basic mobility with progression to ambulation. Utilized ace wrap on RLE to promote ankle dorsiflexion. +2 min-mod assist to ambulate with HW with assist to advance and place RLE and for balance. Around end of bed was a tight spot and pt required assist to maneuver HW, assist for advancing RLE, and cues for sequencing. Worked on standing balance and tolerance at sink with pt requiring up to min assist and assist to WB through RUE. Continue to feel pt would best be served by post-acute inpatient therapies <3 hrs/day, however understand pt has been difficult to place.    If plan is discharge home, recommend the following: Two people to help with walking and/or transfers;A lot of help with bathing/dressing/bathroom;Assistance with feeding;Assistance with cooking/housework;Direct supervision/assist for financial management;Assist for transportation;Direct supervision/assist for medications management;Supervision due to cognitive status   Can travel by private vehicle     No  Equipment Recommendations  BSC/3in1;Wheelchair (measurements PT);Wheelchair cushion (measurements PT)    Recommendations for Other Services       Precautions / Restrictions Precautions Precautions: Fall;Other  (comment) Recall of Precautions/Restrictions: Impaired Precaution/Restrictions Comments: foley, aphasia, Rt hemiparesis Restrictions Weight Bearing Restrictions Per Provider Order: No     Mobility  Bed Mobility Overal bed mobility: Needs Assistance Bed Mobility: Supine to Sit, Sit to Supine     Supine to sit: Min assist, HOB elevated Sit to supine: Min assist   General bed mobility comments: Min A with use of LUE on side of bed to pull self. Light assist for RLE to EOB and for return back to bed    Transfers Overall transfer level: Needs assistance Equipment used: Hemi-walker Transfers: Sit to/from Stand Sit to Stand: Min assist, +2 safety/equipment           General transfer comment: Min A x 2 present for safety. cues for pushing up from bed surface before reaching for hemiwalker. Cued to push from recliner armrest to stand at sink but pt opted to pull self up with sink counter    Ambulation/Gait Ambulation/Gait assistance: Mod assist, +2 safety/equipment, +2 physical assistance Gait Distance (Feet): 15 Feet (seated rest; 5 ft) Assistive device: Hemi-walker Gait Pattern/deviations: Step-to pattern, Decreased step length - right, Decreased dorsiflexion - right, Decreased weight shift to right, Knee hyperextension - right, Narrow base of support Gait velocity: decreased Gait velocity interpretation: <1.31 ft/sec, indicative of household ambulator   General Gait Details: Pt able to ambulate from far side of bed to sink and back with hemiwalker. Cues for sequencing. Provided ace wrap AFO and pt only requiring Min A now to advance RLE (tends to adduct)   Stairs             Wheelchair Mobility     Tilt Bed    Modified Rankin (Stroke Patients Only) Modified Rankin (Stroke Patients Only) Pre-Morbid Rankin Score: No symptoms Modified  Rankin: Moderately severe disability     Balance Overall balance assessment: Needs assistance Sitting-balance support: Feet  supported Sitting balance-Leahy Scale: Fair Sitting balance - Comments: supervision for safety, no trunk lean noted   Standing balance support: No upper extremity supported, Single extremity supported, During functional activity Standing balance-Leahy Scale: Poor                              Communication Communication Communication: Impaired Factors Affecting Communication: Difficulty expressing self  Cognition Arousal: Alert Behavior During Therapy: Flat affect   PT - Cognitive impairments: Difficult to assess Difficult to assess due to: Impaired communication                       Following commands: Impaired Following commands impaired: Follows one step commands with increased time    Cueing Cueing Techniques: Verbal cues, Gestural cues, Tactile cues  Exercises      General Comments        Pertinent Vitals/Pain Pain Assessment Pain Assessment: No/denies pain    Home Living                          Prior Function            PT Goals (current goals can now be found in the care plan section) Acute Rehab PT Goals Patient Stated Goal: walk PT Goal Formulation: Patient unable to participate in goal setting Time For Goal Achievement: 01/11/25 Potential to Achieve Goals: Good Progress towards PT goals: Goals updated    Frequency    Min 3X/week      PT Plan      Co-evaluation PT/OT/SLP Co-Evaluation/Treatment: Yes Reason for Co-Treatment: Complexity of the patient's impairments (multi-system involvement);For patient/therapist safety PT goals addressed during session: Mobility/safety with mobility;Balance;Proper use of DME OT goals addressed during session: ADL's and self-care;Strengthening/ROM;Proper use of Adaptive equipment and DME      AM-PAC PT 6 Clicks Mobility   Outcome Measure  Help needed turning from your back to your side while in a flat bed without using bedrails?: A Little Help needed moving from lying on  your back to sitting on the side of a flat bed without using bedrails?: A Little Help needed moving to and from a bed to a chair (including a wheelchair)?: A Lot Help needed standing up from a chair using your arms (e.g., wheelchair or bedside chair)?: A Little Help needed to walk in hospital room?: Total Help needed climbing 3-5 steps with a railing? : Total 6 Click Score: 13    End of Session Equipment Utilized During Treatment: Gait belt Activity Tolerance: Patient tolerated treatment well Patient left: in bed;with call bell/phone within reach;with bed alarm set (pt refused chair) Nurse Communication: Mobility status PT Visit Diagnosis: Other abnormalities of gait and mobility (R26.89);Difficulty in walking, not elsewhere classified (R26.2);Hemiplegia and hemiparesis;Other symptoms and signs involving the nervous system (R29.898) Hemiplegia - Right/Left: Right Hemiplegia - dominant/non-dominant: Dominant Hemiplegia - caused by: Cerebral infarction     Time: 1007-1039 PT Time Calculation (min) (ACUTE ONLY): 32 min  Charges:    $Gait Training: 8-22 mins PT General Charges $$ ACUTE PT VISIT: 1 Visit                      Macario RAMAN, PT Acute Rehabilitation Services  Office [phone]    Macario SHAUNNA Soja 01/12/2025, 12:52 PM

## 2025-01-13 DIAGNOSIS — I69391 Dysphagia following cerebral infarction: Secondary | ICD-10-CM | POA: Diagnosis not present

## 2025-01-13 DIAGNOSIS — F17210 Nicotine dependence, cigarettes, uncomplicated: Secondary | ICD-10-CM | POA: Diagnosis not present

## 2025-01-13 DIAGNOSIS — I63232 Cerebral infarction due to unspecified occlusion or stenosis of left carotid arteries: Secondary | ICD-10-CM | POA: Diagnosis not present

## 2025-01-13 DIAGNOSIS — F1010 Alcohol abuse, uncomplicated: Secondary | ICD-10-CM | POA: Diagnosis not present

## 2025-01-13 DIAGNOSIS — B20 Human immunodeficiency virus [HIV] disease: Secondary | ICD-10-CM | POA: Diagnosis not present

## 2025-01-13 DIAGNOSIS — F1410 Cocaine abuse, uncomplicated: Secondary | ICD-10-CM | POA: Diagnosis not present

## 2025-01-13 DIAGNOSIS — I10 Essential (primary) hypertension: Secondary | ICD-10-CM | POA: Diagnosis not present

## 2025-01-13 DIAGNOSIS — R233 Spontaneous ecchymoses: Secondary | ICD-10-CM | POA: Diagnosis not present

## 2025-01-13 DIAGNOSIS — E785 Hyperlipidemia, unspecified: Secondary | ICD-10-CM | POA: Diagnosis not present

## 2025-01-13 LAB — CBC
HCT: 33.3 % — ABNORMAL LOW (ref 39.0–52.0)
Hemoglobin: 11.6 g/dL — ABNORMAL LOW (ref 13.0–17.0)
MCH: 34.3 pg — ABNORMAL HIGH (ref 26.0–34.0)
MCHC: 34.8 g/dL (ref 30.0–36.0)
MCV: 98.5 fL (ref 80.0–100.0)
Platelets: 201 10*3/uL (ref 150–400)
RBC: 3.38 MIL/uL — ABNORMAL LOW (ref 4.22–5.81)
RDW: 12.6 % (ref 11.5–15.5)
WBC: 6.2 10*3/uL (ref 4.0–10.5)
nRBC: 0 % (ref 0.0–0.2)

## 2025-01-13 LAB — BASIC METABOLIC PANEL WITH GFR
Anion gap: 9 (ref 5–15)
BUN: 27 mg/dL — ABNORMAL HIGH (ref 6–20)
CO2: 26 mmol/L (ref 22–32)
Calcium: 8.9 mg/dL (ref 8.9–10.3)
Chloride: 102 mmol/L (ref 98–111)
Creatinine, Ser: 1.18 mg/dL (ref 0.61–1.24)
GFR, Estimated: >60 mL/min
Glucose, Bld: 88 mg/dL (ref 70–99)
Potassium: 4.4 mmol/L (ref 3.5–5.1)
Sodium: 136 mmol/L (ref 135–145)

## 2025-01-13 NOTE — Progress Notes (Addendum)
 B STROKE TEAM PROGRESS NOTE    SIGNIFICANT HOSPITAL EVENTS 1/11: Patient admitted with aphasia and right-sided weakness, given TNK and mechanical thrombectomy performed.  INTERIM HISTORY/SUBJECTIVE  Speech therapy is at the bedside.  No family at the bedside.  Patient is awake alert working with speech therapist  no new neurological events overnight Neurological exam remains stable and unchanged.  Labs and vitals are stable He is medically stable and ready for discharge pending SNF bed availability.  Difficult placement due to prior substance abuse history  CBC    Component Value Date/Time   WBC 6.2 01/13/2025 0028   RBC 3.38 (L) 01/13/2025 0028   HGB 11.6 (L) 01/13/2025 0028   HGB 14.4 01/14/2024 1043   HCT 33.3 (L) 01/13/2025 0028   HCT 42.6 01/14/2024 1043   PLT 201 01/13/2025 0028   PLT 220 01/14/2024 1043   MCV 98.5 01/13/2025 0028   MCV 101 (H) 01/14/2024 1043   MCV 99 01/06/2015 1505   MCH 34.3 (H) 01/13/2025 0028   MCHC 34.8 01/13/2025 0028   RDW 12.6 01/13/2025 0028   RDW 11.9 01/14/2024 1043   RDW 13.0 01/06/2015 1505   LYMPHSABS 0.9 12/27/2024 0705   LYMPHSABS 1.6 01/14/2024 1043   LYMPHSABS 2.6 01/06/2015 1505   MONOABS 0.4 12/27/2024 0705   MONOABS 0.3 01/06/2015 1505   EOSABS 0.0 12/27/2024 0705   EOSABS 0.1 01/14/2024 1043   EOSABS 0.1 01/06/2015 1505   BASOSABS 0.0 12/27/2024 0705   BASOSABS 0.0 01/14/2024 1043   BASOSABS 0.0 01/06/2015 1505    BMET    Component Value Date/Time   NA 136 01/13/2025 0028   NA 141 01/06/2015 1505   K 4.4 01/13/2025 0028   K 3.9 01/06/2015 1505   CL 102 01/13/2025 0028   CL 107 01/06/2015 1505   CO2 26 01/13/2025 0028   CO2 29 01/06/2015 1505   GLUCOSE 88 01/13/2025 0028   GLUCOSE 111 (H) 01/06/2015 1505   BUN 27 (H) 01/13/2025 0028   BUN 16 01/06/2015 1505   CREATININE 1.18 01/13/2025 0028   CREATININE 1.28 01/06/2015 1505   CALCIUM  8.9 01/13/2025 0028   CALCIUM  9.0 01/06/2015 1505   GFRNONAA >60 01/13/2025  0028   GFRNONAA >60 01/06/2015 1505   GFRNONAA >60 05/10/2013 0535    IMAGING past 24 hours No results found.   Vitals:   01/12/25 2020 01/12/25 2340 01/13/25 0450 01/13/25 0838  BP: 118/66 132/71 107/69 107/66  Pulse: (!) 44 (!) 45 (!) 54 (!) 47  Resp: 18 18 18 16   Temp: 98.7 F (37.1 C) 99 F (37.2 C) 98.5 F (36.9 C) 98.3 F (36.8 C)  TempSrc: Oral Oral Oral Oral  SpO2: 100% 99% 97% 97%  Weight:      Height:        PHYSICAL EXAM General:  Alert, well-nourished, well-developed patient in no acute distress Psych:  Flat affect  NEURO:  Mental Status: Patient follows simple commands, nods appropriately.  Speech/Language: No verbal output  Cranial Nerves:  II: PERRL.  III, IV, VI: Left gaze preference, crosses midline VII: Face is symmetrical resting  VIII: hearing intact to voice. XII: tongue is midline without fasciculations. Motor: Able to move left upper and lower extremities with antigravity strength, RUE some movement against gravity can lift his forearm up, able to lift RLE off bed slightly.  Tone: is normal and bulk is normal Sensation- Intact to light touch bilaterally.  Coordination: Unable to perform Gait- deferred   ASSESSMENT/PLAN  Mr.  Roy [name] is a 59 y.o. male with history of HIV, hypertension, substance abuse and alcohol abuse admitted for acute onset right-sided weakness and aphasia.  He was given TNK to treat stroke and was found to have occlusion of left ICA/MCA, and mechanical thrombectomy was successfully performed.  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 s/p TNK and IR with TICI 3, etiology: Likely large vessel occlusion in the setting of multiple risk factors Code Stroke CT head hyperdense left MCA ASPECTS 7 CTA head & neck left ICA occlusion in the neck, left MCA origin occluded MRI acute left MCA territory infarct with confluent petechial hemorrhage, cytotoxic edema and mild  regional mass effect with trace rightward midline shift 2D Echo EF 50 to 55% Will need 30-day monitor at discharge LDL 55 HgbA1c 5.8 UDS positive for cocaine, benzo and fentanyl  VTE prophylaxis - heparin  subcu No antithrombotic prior to admission, continue aspirin  81 mg daily due to petechial hemorrhage Therapy recommendations:  CIR Disposition: Pending  Hypertension Home meds: Losartan  100 mg daily Stable Long-term BP goal normotensive  Hyperlipidemia Home meds: Rosuvastatin  20 mg daily, resumed in hospital LDL 55, goal < 70 Continue statin at discharge  Tobacco Abuse Patient smokes 0.25 packs per day for 40 years      Ready to quit? N/A Nicotine  replacement therapy provided  Cocaine abuse Patient uses cocaine UDS positive for Cocaine      Ready to quit? N/A TOC consult for cessation placed  Dysphagia Patient has post-stroke dysphagia, SLP consulted Now on dysphagia 1 and thin liquid Aspiration precautions Advance diet as tolerated  Other Stroke Risk Factors Alcohol abuse HIV, on HARRT therapy  Other Active Problems Mild thrombocytopenia, resolved, platelet [phone]  Hospital day # 17     Patient is difficult to place with nursing home but medically stable for discharge   Karna Geralds DNP, ACNPC-AG  [facility]  I have personally obtained history,examined this patient, reviewed notes, independently viewed imaging studies, participated in medical decision making and plan of care.ROS completed by me personally and pertinent positives fully documented  I have made any additions or clarifications directly to the above note. Agree with note above.  Await difficult disposition to skilled nursing facility.  Medically stable for discharge  Eather Popp, MD Medical Director [facility] Pager: [phone] 01/13/2025 2:10 PM

## 2025-01-13 NOTE — Plan of Care (Signed)
" °  Problem: Nutrition: Goal: Risk of aspiration will decrease Outcome: Progressing   Problem: Clinical Measurements: Goal: Ability to maintain clinical measurements within normal limits Outcome: Progressing   Problem: Respiratory: Goal: Respiratory status will improve Outcome: Progressing   Problem: Skin Integrity: Goal: Demonstrates signs of wound healing without infection Outcome: Progressing   Problem: Pain Managment: Goal: General experience of comfort will improve and/or be controlled Outcome: Progressing   Problem: Safety: Goal: Ability to remain free from injury will improve Outcome: Progressing   "

## 2025-01-13 NOTE — Progress Notes (Signed)
 Speech Language Pathology Treatment: Dysphagia;Cognitive-Linguistic  Patient Details Name: Roy [name] MRN: [id number] DOB: [birth year] Today's Date: 01/13/2025 Time: 8962-8941 SLP Time Calculation (min) (ACUTE ONLY): 21 min  Assessment / Plan / Recommendation Clinical Impression  Roy [name] exhibited improvements in oral manipulation, mastication and transit with upgraded trial of regular without residual across multiple trials. Sips water tolerated well. Will upgrade to Dys 3 (chopped meats) with intervention for safety/efficiency prior to possible upgrade to regular (does not typically wear dentures when eating). Will follow.   Comprehension for basic information improving with 100% following one step commands (will advance to 2 step), simple y/n 100% but mildly more abstract 0/3.  Spontaneous verbalizations are vocalic without word or labial approximation during automatic tasks or biographical information with verbal/visual and use of melodic tapping. Pointed to correct number 100% and communication board was 100% given only description/semantic information for identifying picture versus SLP stating object. His use of gestures to request needs was not not reliable and mostly inaccurate. Taped communication board to closet and pt can point/request board to use with staff.    HPI HPI: 59 yo male presenting to ED 1/11 from EtOH rehab center with R sided weakness and AMS s/p fall. CTA shows proximal L MCA and ICA occlusion s/p TNK and thrombectomy. Complicated by R CFA occlusion from closure device requiring exploration and endarterectomy with angioplasty. MRI shows acute L MCA territory infarct, confluent at the L basal ganglia, insula, and operculum as well as ongoing abnormal L ICA flow below the skull base. MBS 1/12 with primary oral dysphagia and only trace, transient penetration of thin liquids. PMH includes HIV, history of hepatitis C, bipolar disorder, cocaine and EtOH abuse      SLP Plan   Continue with current plan of care        Swallow Evaluation Recommendations   Recommendations: PO diet PO Diet Recommendation: Dysphagia 3 (Mechanical soft);Thin liquids (Level 0) Liquid Administration via: Cup;Straw Medication Administration:  (as tolerated) Supervision: Patient able to self-feed Postural changes: Position pt fully upright for meals Oral care recommendations: Oral care BID (2x/day)     Recommendations                     Oral care BID   Set up Supervision/Assistance Dysphagia, oropharyngeal phase (R13.12);Aphasia (R47.01)     Continue with current plan of care     Roy [name]  01/13/2025, 11:08 AM

## 2025-01-13 NOTE — Plan of Care (Signed)
 " Problem: Education: Goal: Knowledge of disease or condition will improve Outcome: Progressing Goal: Knowledge of secondary prevention will improve (MUST DOCUMENT ALL) Outcome: Progressing Goal: Knowledge of patient specific risk factors will improve (DELETE if not current risk factor) Outcome: Progressing   Problem: Ischemic Stroke/TIA Tissue Perfusion: Goal: Complications of ischemic stroke/TIA will be minimized Outcome: Progressing   Problem: Coping: Goal: Will verbalize positive feelings about self Outcome: Progressing Goal: Will identify appropriate support needs Outcome: Progressing   Problem: Health Behavior/Discharge Planning: Goal: Ability to manage health-related needs will improve Outcome: Progressing Goal: Goals will be collaboratively established with patient/family Outcome: Progressing   Problem: Self-Care: Goal: Ability to participate in self-care as condition permits will improve Outcome: Progressing Goal: Verbalization of feelings and concerns over difficulty with self-care will improve Outcome: Progressing Goal: Ability to communicate needs accurately will improve Outcome: Progressing   Problem: Nutrition: Goal: Risk of aspiration will decrease Outcome: Progressing Goal: Dietary intake will improve Outcome: Progressing   Problem: Education: Goal: Knowledge of the prescribed therapeutic regimen will improve Outcome: Progressing   Problem: Bowel/Gastric: Goal: Gastrointestinal status for postoperative course will improve Outcome: Progressing   Problem: Cardiac: Goal: Ability to maintain an adequate cardiac output Outcome: Progressing Goal: Will show no evidence of cardiac arrhythmias Outcome: Progressing   Problem: Nutritional: Goal: Will attain and maintain optimal nutritional status Outcome: Progressing   Problem: Neurological: Goal: Will regain or maintain usual level of consciousness Outcome: Progressing   Problem: Clinical  Measurements: Goal: Ability to maintain clinical measurements within normal limits Outcome: Progressing Goal: Postoperative complications will be avoided or minimized Outcome: Progressing   Problem: Respiratory: Goal: Respiratory status will improve Outcome: Progressing   Problem: Skin Integrity: Goal: Demonstrates signs of wound healing without infection Outcome: Progressing   Problem: Urinary Elimination: Goal: Will remain free from infection Outcome: Progressing Goal: Ability to achieve and maintain adequate urine output Outcome: Progressing   Problem: Education: Goal: Knowledge of General Education information will improve Description: Including pain rating scale, medication(s)/side effects and non-pharmacologic comfort measures Outcome: Progressing   Problem: Health Behavior/Discharge Planning: Goal: Ability to manage health-related needs will improve Outcome: Progressing   Problem: Clinical Measurements: Goal: Ability to maintain clinical measurements within normal limits will improve Outcome: Progressing Goal: Will remain free from infection Outcome: Progressing Goal: Diagnostic test results will improve Outcome: Progressing Goal: Respiratory complications will improve Outcome: Progressing Goal: Cardiovascular complication will be avoided Outcome: Progressing   Problem: Activity: Goal: Risk for activity intolerance will decrease Outcome: Progressing   Problem: Nutrition: Goal: Adequate nutrition will be maintained Outcome: Progressing   Problem: Coping: Goal: Level of anxiety will decrease Outcome: Progressing   Problem: Elimination: Goal: Will not experience complications related to bowel motility Outcome: Progressing Goal: Will not experience complications related to urinary retention Outcome: Progressing   Problem: Pain Managment: Goal: General experience of comfort will improve and/or be controlled Outcome: Progressing   Problem:  Safety: Goal: Ability to remain free from injury will improve Outcome: Progressing   Problem: Skin Integrity: Goal: Risk for impaired skin integrity will decrease Outcome: Progressing   Problem: Education: Goal: Understanding of CV disease, CV risk reduction, and recovery process will improve Outcome: Progressing Goal: Individualized Educational Video(s) Outcome: Progressing   Problem: Activity: Goal: Ability to return to baseline activity level will improve Outcome: Progressing   Problem: Cardiovascular: Goal: Ability to achieve and maintain adequate cardiovascular perfusion will improve Outcome: Progressing Goal: Vascular access site(s) Level 0-1 will be maintained Outcome: Progressing   Problem: Health Behavior/Discharge  Planning: Goal: Ability to safely manage health-related needs after discharge will improve Outcome: Progressing   "

## 2025-01-14 DIAGNOSIS — E785 Hyperlipidemia, unspecified: Secondary | ICD-10-CM | POA: Diagnosis not present

## 2025-01-14 DIAGNOSIS — R233 Spontaneous ecchymoses: Secondary | ICD-10-CM | POA: Diagnosis not present

## 2025-01-14 DIAGNOSIS — F1410 Cocaine abuse, uncomplicated: Secondary | ICD-10-CM | POA: Diagnosis not present

## 2025-01-14 DIAGNOSIS — F17210 Nicotine dependence, cigarettes, uncomplicated: Secondary | ICD-10-CM | POA: Diagnosis not present

## 2025-01-14 DIAGNOSIS — I63232 Cerebral infarction due to unspecified occlusion or stenosis of left carotid arteries: Secondary | ICD-10-CM | POA: Diagnosis not present

## 2025-01-14 DIAGNOSIS — B20 Human immunodeficiency virus [HIV] disease: Secondary | ICD-10-CM | POA: Diagnosis not present

## 2025-01-14 DIAGNOSIS — I69391 Dysphagia following cerebral infarction: Secondary | ICD-10-CM | POA: Diagnosis not present

## 2025-01-14 DIAGNOSIS — I10 Essential (primary) hypertension: Secondary | ICD-10-CM | POA: Diagnosis not present

## 2025-01-14 DIAGNOSIS — F1010 Alcohol abuse, uncomplicated: Secondary | ICD-10-CM | POA: Diagnosis not present

## 2025-01-14 NOTE — Progress Notes (Signed)
 Physical Therapy Treatment Patient Details Name: Roy [name] MRN: [id number] DOB: 1966/09/15 Today's Date: 01/14/2025   History of Present Illness 59 yo M adm 12/27/24 from ETOH rehab center after fall with Rt weakness, AMS. Pt with Lt ICA/MCA CVA s/p Lt ICA/MCA thrombectomy complicated by Rt CFA occlusion from closure device requiring exploration and endarterectomy with angioplasty. Negative pressure dressing Rt groin  PMHx: cocaine & ETOH abuse, HIV, bipolar disorder, CAD    PT Comments  Pt and cousin in room on entry. Introduced [employer] and level of commitment needed to participate in program. Discussed need for acceptance to program but pt agreeable to try if given opportunity. [name] cousin agreeable to bring clothing and shoes. Pt is able to get to EoB min Ax2 and stand in hemiwalker with modAx2. Pt ambulated to sink with modAx2 where he was able to stand for self care tasks. After seated rest break pt ambulated in hallway. Pt will benefit from AFO when shoes are present. Will discuss pt with therapy supervisior for admission into program.     If plan is discharge home, recommend the following: Two people to help with walking and/or transfers;A lot of help with bathing/dressing/bathroom;Assistance with feeding;Assistance with cooking/housework;Direct supervision/assist for financial management;Assist for transportation;Direct supervision/assist for medications management;Supervision due to cognitive status   Can travel by private vehicle     No  Equipment Recommendations  BSC/3in1;Wheelchair (measurements PT);Wheelchair cushion (measurements PT)       Precautions / Restrictions Precautions Precautions: Fall;Other (comment) Recall of Precautions/Restrictions: Impaired Precaution/Restrictions Comments: foley, aphasia, Rt hemiparesis Restrictions Weight Bearing Restrictions Per Provider Order: No     Mobility  Bed Mobility Overal bed mobility: Needs Assistance Bed Mobility:  Supine to Sit, Sit to Supine     Supine to sit: Mod assist, HOB elevated Sit to supine: Min assist   General bed mobility comments: assistance with RLE and trunk  to get to EOB and assistance with RLE to return to supine    Transfers Overall transfer level: Needs assistance Equipment used: Hemi-walker Transfers: Sit to/from Stand Sit to Stand: Min assist, +2 safety/equipment           General transfer comment: min assist +2 to power up from EOB and recliner, cues for reaching back to sit, assistance to advance RLE    Ambulation/Gait Ambulation/Gait assistance: Mod assist, +2 safety/equipment Gait Distance (Feet): 8 Feet (+15) Assistive device: Hemi-walker Gait Pattern/deviations: Step-to pattern, Decreased step length - right, Decreased dorsiflexion - right, Decreased weight shift to right, Knee hyperextension - right, Narrow base of support Gait velocity: decreased Gait velocity interpretation: <1.31 ft/sec, indicative of household ambulator   General Gait Details: ambulates bed to sink where he stood for self care, sat in recliner and wheeled to hallway where he was able to ambulate 15 feet with modAx2 with hemiwalker    Modified Rankin (Stroke Patients Only) Modified Rankin (Stroke Patients Only) Pre-Morbid Rankin Score: No symptoms Modified Rankin: Moderately severe disability     Balance Overall balance assessment: Needs assistance Sitting-balance support: Feet supported Sitting balance-Leahy Scale: Fair Sitting balance - Comments: supervision for safety on EOB   Standing balance support: Single extremity supported Standing balance-Leahy Scale: Poor Standing balance comment: CGA for standing at sink for self care                            Communication Communication Communication: Impaired Factors Affecting Communication: Difficulty expressing self  Cognition Arousal: Alert Behavior  During Therapy: Flat affect   PT - Cognitive impairments:  Difficult to assess Difficult to assess due to: Impaired communication                     PT - Cognition Comments: able to nod yes/no >75% of the time Following commands: Impaired Following commands impaired: Follows one step commands with increased time    Cueing Cueing Techniques: Verbal cues, Gestural cues, Tactile cues  Exercises      General Comments General comments (skin integrity, edema, etc.): [name] counsin present and agrees to bring clothing and shoes to progress self care goals and get AFO for foot drop      Pertinent Vitals/Pain Pain Assessment Pain Assessment: Faces Faces Pain Scale: Hurts a little bit Pain Location: RUE with ROM Pain Descriptors / Indicators: Discomfort, Grimacing Pain Intervention(s): Limited activity within patient's tolerance, Monitored during session, Repositioned     PT Goals (current goals can now be found in the care plan section) Acute Rehab PT Goals PT Goal Formulation: Patient unable to participate in goal setting Time For Goal Achievement: 01/26/25 Potential to Achieve Goals: Good Progress towards PT goals: Progressing toward goals    Frequency    Min 3X/week           Co-evaluation PT/OT/SLP Co-Evaluation/Treatment: Yes Reason for Co-Treatment: Complexity of the patient's impairments (multi-system involvement);For patient/therapist safety PT goals addressed during session: Mobility/safety with mobility;Balance;Proper use of DME OT goals addressed during session: ADL's and self-care;Strengthening/ROM;Proper use of Adaptive equipment and DME      AM-PAC PT 6 Clicks Mobility   Outcome Measure  Help needed turning from your back to your side while in a flat bed without using bedrails?: A Little Help needed moving from lying on your back to sitting on the side of a flat bed without using bedrails?: A Little Help needed moving to and from a bed to a chair (including a wheelchair)?: A Lot Help needed standing up  from a chair using your arms (e.g., wheelchair or bedside chair)?: A Lot Help needed to walk in hospital room?: Total Help needed climbing 3-5 steps with a railing? : Total 6 Click Score: 12    End of Session Equipment Utilized During Treatment: Gait belt Activity Tolerance: Patient tolerated treatment well Patient left:  (with OT in hallway)   PT Visit Diagnosis: Other abnormalities of gait and mobility (R26.89);Difficulty in walking, not elsewhere classified (R26.2);Hemiplegia and hemiparesis;Other symptoms and signs involving the nervous system (R29.898) Hemiplegia - Right/Left: Right Hemiplegia - dominant/non-dominant: Dominant Hemiplegia - caused by: Cerebral infarction     Time: 8676-8651 PT Time Calculation (min) (ACUTE ONLY): 25 min  Charges:    $Gait Training: 8-22 mins PT General Charges $$ ACUTE PT VISIT: 1 Visit                     Ainhoa Rallo B. Fleeta Lapidus PT, DPT Acute Rehabilitation Services Please use secure chat or  Call Office [phone]    Almarie KATHEE Fleeta [facility] 01/14/2025, 4:06 PM

## 2025-01-14 NOTE — Progress Notes (Addendum)
 B STROKE TEAM PROGRESS NOTE    SIGNIFICANT HOSPITAL EVENTS 1/11: Patient admitted with aphasia and right-sided weakness, given TNK and mechanical thrombectomy performed.  INTERIM HISTORY/SUBJECTIVE  Patient is seen in his room with no family at the bedside.  He remains hemodynamically stable and afebrile, and neurological exam is stable.  He is pending placement at a skilled nursing facility.  CBC    Component Value Date/Time   WBC 6.2 01/13/2025 0028   RBC 3.38 (L) 01/13/2025 0028   HGB 11.6 (L) 01/13/2025 0028   HGB 14.4 01/14/2024 1043   HCT 33.3 (L) 01/13/2025 0028   HCT 42.6 01/14/2024 1043   PLT 201 01/13/2025 0028   PLT 220 01/14/2024 1043   MCV 98.5 01/13/2025 0028   MCV 101 (H) 01/14/2024 1043   MCV 99 01/06/2015 1505   MCH 34.3 (H) 01/13/2025 0028   MCHC 34.8 01/13/2025 0028   RDW 12.6 01/13/2025 0028   RDW 11.9 01/14/2024 1043   RDW 13.0 01/06/2015 1505   LYMPHSABS 0.9 12/27/2024 0705   LYMPHSABS 1.6 01/14/2024 1043   LYMPHSABS 2.6 01/06/2015 1505   MONOABS 0.4 12/27/2024 0705   MONOABS 0.3 01/06/2015 1505   EOSABS 0.0 12/27/2024 0705   EOSABS 0.1 01/14/2024 1043   EOSABS 0.1 01/06/2015 1505   BASOSABS 0.0 12/27/2024 0705   BASOSABS 0.0 01/14/2024 1043   BASOSABS 0.0 01/06/2015 1505    BMET    Component Value Date/Time   NA 136 01/13/2025 0028   NA 141 01/06/2015 1505   K 4.4 01/13/2025 0028   K 3.9 01/06/2015 1505   CL 102 01/13/2025 0028   CL 107 01/06/2015 1505   CO2 26 01/13/2025 0028   CO2 29 01/06/2015 1505   GLUCOSE 88 01/13/2025 0028   GLUCOSE 111 (H) 01/06/2015 1505   BUN 27 (H) 01/13/2025 0028   BUN 16 01/06/2015 1505   CREATININE 1.18 01/13/2025 0028   CREATININE 1.28 01/06/2015 1505   CALCIUM  8.9 01/13/2025 0028   CALCIUM  9.0 01/06/2015 1505   GFRNONAA >60 01/13/2025 0028   GFRNONAA >60 01/06/2015 1505   GFRNONAA >60 05/10/2013 0535    IMAGING past 24 hours No results found.   Vitals:   01/14/25 0052 01/14/25 0422 01/14/25  0803 01/14/25 1149  BP: 128/80 114/74 111/70 102/66  Pulse: (!) 45 (!) 44 (!) 44 (!) 42  Resp: 17 18 18 18   Temp: 98.7 F (37.1 C) 98.7 F (37.1 C) 98.9 F (37.2 C) 98.7 F (37.1 C)  TempSrc: Oral Oral Oral Oral  SpO2: 96% 97% 96% 96%  Weight:      Height:        PHYSICAL EXAM General:  Alert, well-nourished, well-developed patient in no acute distress Psych:  Flat affect  NEURO:  Mental Status: Patient follows simple commands, nods appropriately.  Speech/Language: No verbal output  Cranial Nerves:  II: PERRL.  III, IV, VI: Left gaze preference, crosses midline VII: Face is symmetrical resting  VIII: hearing intact to voice. XII: tongue is midline without fasciculations. Motor: Able to move left upper and lower extremities with antigravity strength, RUE some movement against gravity can lift his forearm up, able to lift RLE off bed slightly.  Tone: is normal and bulk is normal Sensation- Intact to light touch bilaterally.  Coordination: Unable to perform Gait- deferred   ASSESSMENT/PLAN  Roy [name] is a 59 y.o. male with history of HIV, hypertension, substance abuse and alcohol abuse admitted for acute onset right-sided weakness and aphasia.  He was given TNK to treat stroke and was found to have occlusion of left ICA/MCA, and mechanical thrombectomy was successfully performed.  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 s/p TNK and IR with TICI 3, etiology: Likely large vessel occlusion in the setting of multiple risk factors Code Stroke CT head hyperdense left MCA ASPECTS 7 CTA head & neck left ICA occlusion in the neck, left MCA origin occluded MRI acute left MCA territory infarct with confluent petechial hemorrhage, cytotoxic edema and mild regional mass effect with trace rightward midline shift 2D Echo EF 50 to 55% Will need 30-day monitor at discharge LDL 55 HgbA1c 5.8 UDS positive for cocaine,  benzo and fentanyl  VTE prophylaxis - heparin  subcu No antithrombotic prior to admission, continue aspirin  81 mg daily due to petechial hemorrhage Therapy recommendations:  CIR Disposition: Pending  Hypertension Home meds: Losartan  100 mg daily Stable Long-term BP goal normotensive  Hyperlipidemia Home meds: Rosuvastatin  20 mg daily, resumed in hospital LDL 55, goal < 70 Continue statin at discharge  Tobacco Abuse Patient smokes 0.25 packs per day for 40 years      Ready to quit? N/A Nicotine  replacement therapy provided  Cocaine abuse Patient uses cocaine UDS positive for Cocaine      Ready to quit? N/A TOC consult for cessation placed  Dysphagia Patient has post-stroke dysphagia, SLP consulted Now on dysphagia 1 and thin liquid Aspiration precautions Advance diet as tolerated  Other Stroke Risk Factors Alcohol abuse HIV, on HARRT therapy  Other Active Problems Mild thrombocytopenia, resolved, platelet 137--[phone]--[phone]  Hospital day # 18  Patient seen by NP with MD, MD to edit note as needed. Cortney E Everitt Clint Kill , MSN, AGACNP-BC [facility] See Amion for schedule and pager information 01/14/2025 1:11 PM  I have personally obtained history,examined this patient, reviewed notes, independently viewed imaging studies, participated in medical decision making and plan of care.ROS completed by me personally and pertinent positives fully documented  I have made any additions or clarifications directly to the above note. Agree with note above.    Eather Popp, MD Medical Director [facility] Pager: [phone] 01/14/2025 3:27 PM

## 2025-01-14 NOTE — Plan of Care (Signed)
 " Problem: Education: Goal: Knowledge of disease or condition will improve Outcome: Progressing Goal: Knowledge of secondary prevention will improve (MUST DOCUMENT ALL) Outcome: Progressing Goal: Knowledge of patient specific risk factors will improve (DELETE if not current risk factor) Outcome: Progressing   Problem: Ischemic Stroke/TIA Tissue Perfusion: Goal: Complications of ischemic stroke/TIA will be minimized Outcome: Progressing   Problem: Coping: Goal: Will verbalize positive feelings about self Outcome: Progressing Goal: Will identify appropriate support needs Outcome: Progressing   Problem: Health Behavior/Discharge Planning: Goal: Ability to manage health-related needs will improve Outcome: Progressing Goal: Goals will be collaboratively established with patient/family Outcome: Progressing   Problem: Self-Care: Goal: Ability to participate in self-care as condition permits will improve Outcome: Progressing Goal: Verbalization of feelings and concerns over difficulty with self-care will improve Outcome: Progressing Goal: Ability to communicate needs accurately will improve Outcome: Progressing   Problem: Nutrition: Goal: Risk of aspiration will decrease Outcome: Progressing Goal: Dietary intake will improve Outcome: Progressing   Problem: Education: Goal: Knowledge of the prescribed therapeutic regimen will improve Outcome: Progressing   Problem: Bowel/Gastric: Goal: Gastrointestinal status for postoperative course will improve Outcome: Progressing   Problem: Cardiac: Goal: Ability to maintain an adequate cardiac output Outcome: Progressing Goal: Will show no evidence of cardiac arrhythmias Outcome: Progressing   Problem: Nutritional: Goal: Will attain and maintain optimal nutritional status Outcome: Progressing   Problem: Neurological: Goal: Will regain or maintain usual level of consciousness Outcome: Progressing   Problem: Clinical  Measurements: Goal: Ability to maintain clinical measurements within normal limits Outcome: Progressing Goal: Postoperative complications will be avoided or minimized Outcome: Progressing   Problem: Respiratory: Goal: Respiratory status will improve Outcome: Progressing   Problem: Skin Integrity: Goal: Demonstrates signs of wound healing without infection Outcome: Progressing   Problem: Urinary Elimination: Goal: Will remain free from infection Outcome: Progressing Goal: Ability to achieve and maintain adequate urine output Outcome: Progressing   Problem: Education: Goal: Knowledge of General Education information will improve Description: Including pain rating scale, medication(s)/side effects and non-pharmacologic comfort measures Outcome: Progressing   Problem: Health Behavior/Discharge Planning: Goal: Ability to manage health-related needs will improve Outcome: Progressing   Problem: Clinical Measurements: Goal: Ability to maintain clinical measurements within normal limits will improve Outcome: Progressing Goal: Will remain free from infection Outcome: Progressing Goal: Diagnostic test results will improve Outcome: Progressing Goal: Respiratory complications will improve Outcome: Progressing Goal: Cardiovascular complication will be avoided Outcome: Progressing   Problem: Activity: Goal: Risk for activity intolerance will decrease Outcome: Progressing   Problem: Nutrition: Goal: Adequate nutrition will be maintained Outcome: Progressing   Problem: Coping: Goal: Level of anxiety will decrease Outcome: Progressing   Problem: Elimination: Goal: Will not experience complications related to bowel motility Outcome: Progressing Goal: Will not experience complications related to urinary retention Outcome: Progressing   Problem: Pain Managment: Goal: General experience of comfort will improve and/or be controlled Outcome: Progressing   Problem:  Safety: Goal: Ability to remain free from injury will improve Outcome: Progressing   Problem: Skin Integrity: Goal: Risk for impaired skin integrity will decrease Outcome: Progressing   Problem: Education: Goal: Understanding of CV disease, CV risk reduction, and recovery process will improve Outcome: Progressing Goal: Individualized Educational Video(s) Outcome: Progressing   Problem: Activity: Goal: Ability to return to baseline activity level will improve Outcome: Progressing   Problem: Cardiovascular: Goal: Ability to achieve and maintain adequate cardiovascular perfusion will improve Outcome: Progressing Goal: Vascular access site(s) Level 0-1 will be maintained Outcome: Progressing   Problem: Health Behavior/Discharge  Planning: Goal: Ability to safely manage health-related needs after discharge will improve Outcome: Progressing   "

## 2025-01-14 NOTE — Progress Notes (Signed)
 Patient declined to get OOB at this time. SCDs replaced and education provided.

## 2025-01-14 NOTE — TOC Progression Note (Signed)
 Transition of Care [facility]) - Progression Note    Patient Details  Name: Roy [name] MRN: [id number] Date of Birth: [birth year]  [facility] CM/SW Contact  Almarie CHRISTELLA Goodie, [place] Phone Number: 01/14/2025, 4:27 PM  Clinical Narrative:   CSW coordinated with Acute Rehab team to discuss patient's possible eligibility for STAR program, as SNF placement is unlikely. CSW to follow.    Expected Discharge Plan: Skilled Nursing Facility Barriers to Discharge: Insurance Authorization, Inadequate or no insurance, Active Substance Use - Placement, SNF Pending bed offer               Expected Discharge Plan and Services                                               Social Drivers of Health (SDOH) Interventions SDOH Screenings   Food Insecurity: No Food Insecurity (06/03/2024)  Housing: Patient Declined (05/04/2024)  Transportation Needs: Unmet Transportation Needs (06/03/2024)  Utilities: At Risk (06/03/2024)  Depression (PHQ2-9): Low Risk (06/08/2024)  Recent Concern: Depression (PHQ2-9) - High Risk (06/07/2024)  Financial Resource Strain: Medium Risk (01/20/2024)   Received from [facility]  Tobacco Use: High Risk (12/31/2024)  Health Literacy: Low Risk  (07/02/2022)   Received from [facility]    Readmission Risk Interventions     No data to display

## 2025-01-14 NOTE — Progress Notes (Signed)
 Occupational Therapy Treatment Patient Details Name: Roy [name] MRN: [id number] DOB: March 26, 1966 Today's Date: 01/14/2025   History of present illness 59 yo M adm 12/27/24 from ETOH rehab center after fall with Rt weakness, AMS. Pt with Lt ICA/MCA CVA s/p Lt ICA/MCA thrombectomy complicated by Rt CFA occlusion from closure device requiring exploration and endarterectomy with angioplasty. Negative pressure dressing Rt groin  PMHx: cocaine & ETOH abuse, HIV, bipolar disorder, CAD   OT comments  Patient received in supine with family present. Patient was provided on STAR program and expectations for participation and goals. Patient acknowledged and indicated interest.  OT/PT to pursue further as potential candidate for STAR program. Patient was instructed on bed mobility and required mod assist to get to EOB. Patient was min assist +2 to stand and ambulate to sink for grooming tasks. Patient able to brush teeth with assistance to remove toothpaste lid and was face. Patient performed mobility with assistance to advance RLE. Patient asked to returned to bed at end of session with mod assist using Hemi walker and min assist to return to supine. Patient will benefit from continued inpatient follow up therapy, <3 hours/day.  Acute OT to continue to follow to address established goals to facilitate DC to next venue of care.        If plan is discharge home, recommend the following:  A lot of help with walking and/or transfers;A lot of help with bathing/dressing/bathroom;Assistance with cooking/housework;Direct supervision/assist for medications management;Direct supervision/assist for financial management;Assist for transportation;Help with stairs or ramp for entrance   Equipment Recommendations  Other (comment) (TBD)    Recommendations for Other Services      Precautions / Restrictions Precautions Precautions: Fall;Other (comment) Recall of Precautions/Restrictions:  Impaired Precaution/Restrictions Comments: foley, aphasia, Rt hemiparesis Restrictions Weight Bearing Restrictions Per Provider Order: No       Mobility Bed Mobility Overal bed mobility: Needs Assistance Bed Mobility: Supine to Sit, Sit to Supine     Supine to sit: Mod assist, HOB elevated Sit to supine: Min assist   General bed mobility comments: assistance with RLE and trunk  to get to EOB and assistance with RLE to return to supine    Transfers Overall transfer level: Needs assistance Equipment used: Hemi-walker Transfers: Sit to/from Stand Sit to Stand: Min assist, +2 safety/equipment           General transfer comment: min assist +2 to power up from EOB and recliner, cues for reaching back to sit, assistance to advance RLE     Balance Overall balance assessment: Needs assistance Sitting-balance support: Feet supported Sitting balance-Leahy Scale: Fair Sitting balance - Comments: supervision for safety on EOB                                   ADL either performed or assessed with clinical judgement   ADL Overall ADL's : Needs assistance/impaired     Grooming: Wash/dry hands;Wash/dry face;Oral care;Minimal assistance;Standing Grooming Details (indicate cue type and reason): assistance with opening toothpaste, CGA for balance         Upper Body Dressing : Sitting;Minimal assistance Upper Body Dressing Details (indicate cue type and reason): to donn gown to cover back and patient able to doff with verbal cues                   General ADL Comments: Family member present and OT asked if she could bring clothes for  patient to wear    Extremity/Trunk Assessment              Vision       Perception     Praxis     Communication Communication Communication: Impaired Factors Affecting Communication: Difficulty expressing self   Cognition Arousal: Alert Behavior During Therapy: Flat affect Cognition: Difficult to  assess Difficult to assess due to: Impaired communication           OT - Cognition Comments: answers with head nods for yes/no questions                 Following commands: Impaired Following commands impaired: Follows one step commands with increased time      Cueing   Cueing Techniques: Verbal cues, Gestural cues, Tactile cues  Exercises      Shoulder Instructions       General Comments [name] cousin present and supportive    Pertinent Vitals/ Pain       Pain Assessment Pain Assessment: Faces Faces Pain Scale: Hurts a little bit Pain Location: RUE with ROM Pain Descriptors / Indicators: Discomfort, Grimacing Pain Intervention(s): Limited activity within patient's tolerance, Monitored during session, Repositioned  Home Living                                          Prior Functioning/Environment              Frequency  Min 2X/week        Progress Toward Goals  OT Goals(current goals can now be found in the care plan section)  Progress towards OT goals: Progressing toward goals  Acute Rehab OT Goals Patient Stated Goal: unable to state OT Goal Formulation: Patient unable to participate in goal setting Time For Goal Achievement: 01/26/25 Potential to Achieve Goals: Good  Plan      Co-evaluation    PT/OT/SLP Co-Evaluation/Treatment: Yes Reason for Co-Treatment: Complexity of the patient's impairments (multi-system involvement);For patient/therapist safety PT goals addressed during session: Mobility/safety with mobility;Balance;Proper use of DME OT goals addressed during session: ADL's and self-care;Strengthening/ROM;Proper use of Adaptive equipment and DME      AM-PAC OT 6 Clicks Daily Activity     Outcome Measure   Help from another person eating meals?: A Little Help from another person taking care of personal grooming?: A Little Help from another person toileting, which includes using toliet, bedpan, or urinal?:  A Lot Help from another person bathing (including washing, rinsing, drying)?: A Lot Help from another person to put on and taking off regular upper body clothing?: A Lot Help from another person to put on and taking off regular lower body clothing?: A Lot 6 Click Score: 14    End of Session Equipment Utilized During Treatment: Gait belt;Other (comment) (hemiwalker)  OT Visit Diagnosis: Unsteadiness on feet (R26.81);Other abnormalities of gait and mobility (R26.89);Muscle weakness (generalized) (M62.81);Hemiplegia and hemiparesis;Cognitive communication deficit (R41.841) Symptoms and signs involving cognitive functions: Cerebral infarction Hemiplegia - Right/Left: Right Hemiplegia - dominant/non-dominant: Dominant Hemiplegia - caused by: Cerebral infarction   Activity Tolerance Patient tolerated treatment well   Patient Left in bed;with call bell/phone within reach;with bed alarm set;with family/visitor present   Nurse Communication Mobility status        Time: 1323-1401 OT Time Calculation (min): 38 min  Charges: OT General Charges $OT Visit: 1 Visit OT Treatments $Self Care/Home Management : 8-22 mins $Therapeutic  Activity: 8-22 mins  Dick Laine, OTA Acute Rehabilitation Services  Office [phone]   Jeb LITTIE Laine 01/14/2025, 2:21 PM

## 2025-01-14 NOTE — Plan of Care (Signed)
  Problem: Education: Goal: Knowledge of disease or condition will improve Outcome: Not Progressing   Problem: Ischemic Stroke/TIA Tissue Perfusion: Goal: Complications of ischemic stroke/TIA will be minimized Outcome: Not Progressing

## 2025-01-15 DIAGNOSIS — E785 Hyperlipidemia, unspecified: Secondary | ICD-10-CM | POA: Diagnosis not present

## 2025-01-15 DIAGNOSIS — I69391 Dysphagia following cerebral infarction: Secondary | ICD-10-CM | POA: Diagnosis not present

## 2025-01-15 DIAGNOSIS — I10 Essential (primary) hypertension: Secondary | ICD-10-CM | POA: Diagnosis not present

## 2025-01-15 DIAGNOSIS — F1010 Alcohol abuse, uncomplicated: Secondary | ICD-10-CM | POA: Diagnosis not present

## 2025-01-15 DIAGNOSIS — F17210 Nicotine dependence, cigarettes, uncomplicated: Secondary | ICD-10-CM | POA: Diagnosis not present

## 2025-01-15 DIAGNOSIS — B20 Human immunodeficiency virus [HIV] disease: Secondary | ICD-10-CM | POA: Diagnosis not present

## 2025-01-15 DIAGNOSIS — F1410 Cocaine abuse, uncomplicated: Secondary | ICD-10-CM | POA: Diagnosis not present

## 2025-01-15 DIAGNOSIS — I63232 Cerebral infarction due to unspecified occlusion or stenosis of left carotid arteries: Secondary | ICD-10-CM | POA: Diagnosis not present

## 2025-01-15 DIAGNOSIS — R233 Spontaneous ecchymoses: Secondary | ICD-10-CM | POA: Diagnosis not present

## 2025-01-15 NOTE — Progress Notes (Signed)
 Speech Language Pathology Treatment: Dysphagia  Patient Details Name: Roy [name] MRN: [id number] DOB: 1966-05-18 Today's Date: 01/15/2025 Time: 8763-8755 SLP Time Calculation (min) (ACUTE ONLY): 8 min  Assessment / Plan / Recommendation Clinical Impression  Treatment focused on dysphagia with meal. He was reclined and allowed therapist to raise head of bed partially upright. Texture upgraded to Dys 3 this week. He had mild pocketing on right and on lips without sensation. His comprehension for basic information mostly intact and able to follow visual/verbal commands in attempts to remove cues. This was somewhat effective along with liquid wash. Brought him swabs and demonstrated use requesting he use at end of meal and notified RN to check for clearance at end of meal. No s/s aspiration with solids or liquids. Continue Dys 3/thin and ST for swallow and aphasia.   HPI HPI: 59 yo male presenting to ED 1/11 from EtOH rehab center with R sided weakness and AMS s/p fall. CTA shows proximal L MCA and ICA occlusion s/p TNK and thrombectomy. Complicated by R CFA occlusion from closure device requiring exploration and endarterectomy with angioplasty. MRI shows acute L MCA territory infarct, confluent at the L basal ganglia, insula, and operculum as well as ongoing abnormal L ICA flow below the skull base. MBS 1/12 with primary oral dysphagia and only trace, transient penetration of thin liquids. PMH includes HIV, history of hepatitis C, bipolar disorder, cocaine and EtOH abuse      SLP Plan  Continue with current plan of care        Swallow Evaluation Recommendations   Recommendations: PO diet PO Diet Recommendation: Dysphagia 3 (Mechanical soft);Thin liquids (Level 0) Liquid Administration via: Cup;Straw Medication Administration: Whole meds with puree Supervision: Patient able to self-feed;Intermittent supervision/cueing for swallowing strategies Postural changes: Position pt fully  upright for meals Oral care recommendations: Oral care BID (2x/day)     Recommendations                     Oral care BID   Intermittent Supervision/Assistance Dysphagia, oropharyngeal phase (R13.12);Aphasia (R47.01)     Continue with current plan of care     Dustin Olam Bull  01/15/2025, 1:50 PM

## 2025-01-15 NOTE — Plan of Care (Signed)
" °  Problem: Ischemic Stroke/TIA Tissue Perfusion: Goal: Complications of ischemic stroke/TIA will be minimized Outcome: Progressing   Problem: Self-Care: Goal: Ability to communicate needs accurately will improve Outcome: Progressing   Problem: Nutrition: Goal: Risk of aspiration will decrease Outcome: Progressing   Problem: Neurological: Goal: Will regain or maintain usual level of consciousness Outcome: Progressing   "

## 2025-01-15 NOTE — Progress Notes (Signed)
 B STROKE TEAM PROGRESS NOTE    SIGNIFICANT HOSPITAL EVENTS 1/11: Patient admitted with aphasia and right-sided weakness, given TNK and mechanical thrombectomy performed.  INTERIM HISTORY/SUBJECTIVE  Patient is seen in his room with no family at the bedside.  He remains hemodynamically stable and afebrile, and neurological exam is stable.  He is pending placement at a skilled nursing facility.  CBC    Component Value Date/Time   WBC 6.2 01/13/2025 0028   RBC 3.38 (L) 01/13/2025 0028   HGB 11.6 (L) 01/13/2025 0028   HGB 14.4 01/14/2024 1043   HCT 33.3 (L) 01/13/2025 0028   HCT 42.6 01/14/2024 1043   PLT 201 01/13/2025 0028   PLT 220 01/14/2024 1043   MCV 98.5 01/13/2025 0028   MCV 101 (H) 01/14/2024 1043   MCV 99 01/06/2015 1505   MCH 34.3 (H) 01/13/2025 0028   MCHC 34.8 01/13/2025 0028   RDW 12.6 01/13/2025 0028   RDW 11.9 01/14/2024 1043   RDW 13.0 01/06/2015 1505   LYMPHSABS 0.9 12/27/2024 0705   LYMPHSABS 1.6 01/14/2024 1043   LYMPHSABS 2.6 01/06/2015 1505   MONOABS 0.4 12/27/2024 0705   MONOABS 0.3 01/06/2015 1505   EOSABS 0.0 12/27/2024 0705   EOSABS 0.1 01/14/2024 1043   EOSABS 0.1 01/06/2015 1505   BASOSABS 0.0 12/27/2024 0705   BASOSABS 0.0 01/14/2024 1043   BASOSABS 0.0 01/06/2015 1505    BMET    Component Value Date/Time   NA 136 01/13/2025 0028   NA 141 01/06/2015 1505   K 4.4 01/13/2025 0028   K 3.9 01/06/2015 1505   CL 102 01/13/2025 0028   CL 107 01/06/2015 1505   CO2 26 01/13/2025 0028   CO2 29 01/06/2015 1505   GLUCOSE 88 01/13/2025 0028   GLUCOSE 111 (H) 01/06/2015 1505   BUN 27 (H) 01/13/2025 0028   BUN 16 01/06/2015 1505   CREATININE 1.18 01/13/2025 0028   CREATININE 1.28 01/06/2015 1505   CALCIUM  8.9 01/13/2025 0028   CALCIUM  9.0 01/06/2015 1505   GFRNONAA >60 01/13/2025 0028   GFRNONAA >60 01/06/2015 1505   GFRNONAA >60 05/10/2013 0535    IMAGING past 24 hours No results found.   Vitals:   01/14/25 2109 01/15/25 0500 01/15/25  0838 01/15/25 1147  BP: 116/69 109/72 116/69 112/71  Pulse: (!) 44 (!) 44 (!) 42 (!) 43  Resp: 16 20 18 16   Temp: 98.6 F (37 C) 98.6 F (37 C) 98.3 F (36.8 C) 98.1 F (36.7 C)  TempSrc: Oral Oral Oral Oral  SpO2: 94% 100% 99% 96%  Weight:      Height:        PHYSICAL EXAM General:  Alert, well-nourished, well-developed patient in no acute distress Psych:  Flat affect  NEURO:  Mental Status: Patient follows simple commands, nods appropriately.  Speech/Language: No verbal output  Cranial Nerves:  II: PERRL.  III, IV, VI: Left gaze preference, crosses midline VII: Face is symmetrical resting  VIII: hearing intact to voice. XII: tongue is midline without fasciculations. Motor: Able to move left upper and lower extremities with antigravity strength, RUE some movement against gravity can lift his forearm up, able to lift RLE off bed slightly.  Tone: is normal and bulk is normal Sensation- Intact to light touch bilaterally.  Coordination: Unable to perform Gait- deferred   ASSESSMENT/PLAN  [name] is a 59 y.o. male with history of HIV, hypertension, substance abuse and alcohol abuse admitted for acute onset right-sided weakness and aphasia.  He was given TNK to treat stroke and was found to have occlusion of left ICA/MCA, and mechanical thrombectomy was successfully performed.  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 s/p TNK and IR with TICI 3, etiology: Likely large vessel occlusion in the setting of multiple risk factors Code Stroke CT head hyperdense left MCA ASPECTS 7 CTA head & neck left ICA occlusion in the neck, left MCA origin occluded MRI acute left MCA territory infarct with confluent petechial hemorrhage, cytotoxic edema and mild regional mass effect with trace rightward midline shift 2D Echo EF 50 to 55% Will need 30-day monitor at discharge LDL 55 HgbA1c 5.8 UDS positive for cocaine, benzo  and fentanyl  VTE prophylaxis - heparin  subcu No antithrombotic prior to admission, continue aspirin  81 mg daily due to petechial hemorrhage Therapy recommendations:  CIR Disposition: Pending  Hypertension Home meds: Losartan  100 mg daily Stable Long-term BP goal normotensive  Hyperlipidemia Home meds: Rosuvastatin  20 mg daily, resumed in hospital LDL 55, goal < 70 Continue statin at discharge  Tobacco Abuse Patient smokes 0.25 packs per day for 40 years      Ready to quit? N/A Nicotine  replacement therapy provided  Cocaine abuse Patient uses cocaine UDS positive for Cocaine      Ready to quit? N/A TOC consult for cessation placed  Dysphagia Patient has post-stroke dysphagia, SLP consulted Now on dysphagia 1 and thin liquid Aspiration precautions Advance diet as tolerated  Other Stroke Risk Factors Alcohol abuse HIV, on HARRT therapy  Other Active Problems Mild thrombocytopenia, resolved, platelet 137--[id number]--[phone]  Hospital day # 19  Patient neurological exam is unchanged.  He is medically stable to be transferred to nursing facility for rehab when bed available.  He is a difficult nursing home placement Eather Popp, MD Medical Director [facility] Pager: [phone] 01/15/2025 2:03 PM

## 2025-01-16 DIAGNOSIS — E785 Hyperlipidemia, unspecified: Secondary | ICD-10-CM | POA: Diagnosis not present

## 2025-01-16 DIAGNOSIS — F1010 Alcohol abuse, uncomplicated: Secondary | ICD-10-CM | POA: Diagnosis not present

## 2025-01-16 DIAGNOSIS — I69391 Dysphagia following cerebral infarction: Secondary | ICD-10-CM | POA: Diagnosis not present

## 2025-01-16 DIAGNOSIS — I63232 Cerebral infarction due to unspecified occlusion or stenosis of left carotid arteries: Secondary | ICD-10-CM | POA: Diagnosis not present

## 2025-01-16 DIAGNOSIS — F17210 Nicotine dependence, cigarettes, uncomplicated: Secondary | ICD-10-CM | POA: Diagnosis not present

## 2025-01-16 DIAGNOSIS — I10 Essential (primary) hypertension: Secondary | ICD-10-CM | POA: Diagnosis not present

## 2025-01-16 DIAGNOSIS — F1410 Cocaine abuse, uncomplicated: Secondary | ICD-10-CM | POA: Diagnosis not present

## 2025-01-16 DIAGNOSIS — B20 Human immunodeficiency virus [HIV] disease: Secondary | ICD-10-CM | POA: Diagnosis not present

## 2025-01-16 LAB — BASIC METABOLIC PANEL WITH GFR
Anion gap: 11 (ref 5–15)
BUN: 23 mg/dL — ABNORMAL HIGH (ref 6–20)
CO2: 23 mmol/L (ref 22–32)
Calcium: 9 mg/dL (ref 8.9–10.3)
Chloride: 102 mmol/L (ref 98–111)
Creatinine, Ser: 1.15 mg/dL (ref 0.61–1.24)
GFR, Estimated: >60 mL/min
Glucose, Bld: 91 mg/dL (ref 70–99)
Potassium: 4.3 mmol/L (ref 3.5–5.1)
Sodium: 135 mmol/L (ref 135–145)

## 2025-01-16 LAB — CBC
HCT: 33.6 % — ABNORMAL LOW (ref 39.0–52.0)
Hemoglobin: 11.7 g/dL — ABNORMAL LOW (ref 13.0–17.0)
MCH: 33.9 pg (ref 26.0–34.0)
MCHC: 34.8 g/dL (ref 30.0–36.0)
MCV: 97.4 fL (ref 80.0–100.0)
Platelets: 182 10*3/uL (ref 150–400)
RBC: 3.45 MIL/uL — ABNORMAL LOW (ref 4.22–5.81)
RDW: 12.4 % (ref 11.5–15.5)
WBC: 5.4 10*3/uL (ref 4.0–10.5)
nRBC: 0 % (ref 0.0–0.2)

## 2025-01-16 NOTE — Plan of Care (Signed)
 " Problem: Education: Goal: Knowledge of disease or condition will improve Outcome: Progressing Goal: Knowledge of secondary prevention will improve (MUST DOCUMENT ALL) Outcome: Progressing Goal: Knowledge of patient specific risk factors will improve (DELETE if not current risk factor) Outcome: Progressing   Problem: Ischemic Stroke/TIA Tissue Perfusion: Goal: Complications of ischemic stroke/TIA will be minimized Outcome: Progressing   Problem: Coping: Goal: Will verbalize positive feelings about self Outcome: Progressing Goal: Will identify appropriate support needs Outcome: Progressing   Problem: Health Behavior/Discharge Planning: Goal: Ability to manage health-related needs will improve Outcome: Progressing Goal: Goals will be collaboratively established with patient/family Outcome: Progressing   Problem: Self-Care: Goal: Ability to participate in self-care as condition permits will improve Outcome: Progressing Goal: Verbalization of feelings and concerns over difficulty with self-care will improve Outcome: Progressing Goal: Ability to communicate needs accurately will improve Outcome: Progressing   Problem: Nutrition: Goal: Risk of aspiration will decrease Outcome: Progressing Goal: Dietary intake will improve Outcome: Progressing   Problem: Education: Goal: Knowledge of the prescribed therapeutic regimen will improve Outcome: Progressing   Problem: Bowel/Gastric: Goal: Gastrointestinal status for postoperative course will improve Outcome: Progressing   Problem: Cardiac: Goal: Ability to maintain an adequate cardiac output Outcome: Progressing Goal: Will show no evidence of cardiac arrhythmias Outcome: Progressing   Problem: Nutritional: Goal: Will attain and maintain optimal nutritional status Outcome: Progressing   Problem: Neurological: Goal: Will regain or maintain usual level of consciousness Outcome: Progressing   Problem: Clinical  Measurements: Goal: Ability to maintain clinical measurements within normal limits Outcome: Progressing Goal: Postoperative complications will be avoided or minimized Outcome: Progressing   Problem: Respiratory: Goal: Respiratory status will improve Outcome: Progressing   Problem: Skin Integrity: Goal: Demonstrates signs of wound healing without infection Outcome: Progressing   Problem: Urinary Elimination: Goal: Will remain free from infection Outcome: Progressing Goal: Ability to achieve and maintain adequate urine output Outcome: Progressing   Problem: Education: Goal: Knowledge of General Education information will improve Description: Including pain rating scale, medication(s)/side effects and non-pharmacologic comfort measures Outcome: Progressing   Problem: Health Behavior/Discharge Planning: Goal: Ability to manage health-related needs will improve Outcome: Progressing   Problem: Clinical Measurements: Goal: Ability to maintain clinical measurements within normal limits will improve Outcome: Progressing Goal: Will remain free from infection Outcome: Progressing Goal: Diagnostic test results will improve Outcome: Progressing Goal: Respiratory complications will improve Outcome: Progressing Goal: Cardiovascular complication will be avoided Outcome: Progressing   Problem: Activity: Goal: Risk for activity intolerance will decrease Outcome: Progressing   Problem: Nutrition: Goal: Adequate nutrition will be maintained Outcome: Progressing   Problem: Coping: Goal: Level of anxiety will decrease Outcome: Progressing   Problem: Elimination: Goal: Will not experience complications related to bowel motility Outcome: Progressing Goal: Will not experience complications related to urinary retention Outcome: Progressing   Problem: Pain Managment: Goal: General experience of comfort will improve and/or be controlled Outcome: Progressing   Problem:  Safety: Goal: Ability to remain free from injury will improve Outcome: Progressing   Problem: Skin Integrity: Goal: Risk for impaired skin integrity will decrease Outcome: Progressing   Problem: Education: Goal: Understanding of CV disease, CV risk reduction, and recovery process will improve Outcome: Progressing Goal: Individualized Educational Video(s) Outcome: Progressing   Problem: Activity: Goal: Ability to return to baseline activity level will improve Outcome: Progressing   Problem: Cardiovascular: Goal: Ability to achieve and maintain adequate cardiovascular perfusion will improve Outcome: Progressing Goal: Vascular access site(s) Level 0-1 will be maintained Outcome: Progressing   Problem: Health Behavior/Discharge  Planning: Goal: Ability to safely manage health-related needs after discharge will improve Outcome: Progressing   "

## 2025-01-17 DIAGNOSIS — I63232 Cerebral infarction due to unspecified occlusion or stenosis of left carotid arteries: Secondary | ICD-10-CM | POA: Diagnosis not present

## 2025-01-17 DIAGNOSIS — I10 Essential (primary) hypertension: Secondary | ICD-10-CM | POA: Diagnosis not present

## 2025-01-17 DIAGNOSIS — E785 Hyperlipidemia, unspecified: Secondary | ICD-10-CM | POA: Diagnosis not present

## 2025-01-17 DIAGNOSIS — F17210 Nicotine dependence, cigarettes, uncomplicated: Secondary | ICD-10-CM | POA: Diagnosis not present

## 2025-01-17 NOTE — Progress Notes (Signed)
 B STROKE TEAM PROGRESS NOTE   INTERIM HISTORY/SUBJECTIVE Patient is seen in his room with no family at the bedside.  He is taking nap initially but easily wake up on voice, smiling to provider.  Follow simple commands however still has expressive aphasia.  Right arm and leg weakness continues to improve slowly.  CBC    Component Value Date/Time   WBC 5.4 01/16/2025 0006   RBC 3.45 (L) 01/16/2025 0006   HGB 11.7 (L) 01/16/2025 0006   HGB 14.4 01/14/2024 1043   HCT 33.6 (L) 01/16/2025 0006   HCT 42.6 01/14/2024 1043   PLT 182 01/16/2025 0006   PLT 220 01/14/2024 1043   MCV 97.4 01/16/2025 0006   MCV 101 (H) 01/14/2024 1043   MCV 99 01/06/2015 1505   MCH 33.9 01/16/2025 0006   MCHC 34.8 01/16/2025 0006   RDW 12.4 01/16/2025 0006   RDW 11.9 01/14/2024 1043   RDW 13.0 01/06/2015 1505   LYMPHSABS 0.9 12/27/2024 0705   LYMPHSABS 1.6 01/14/2024 1043   LYMPHSABS 2.6 01/06/2015 1505   MONOABS 0.4 12/27/2024 0705   MONOABS 0.3 01/06/2015 1505   EOSABS 0.0 12/27/2024 0705   EOSABS 0.1 01/14/2024 1043   EOSABS 0.1 01/06/2015 1505   BASOSABS 0.0 12/27/2024 0705   BASOSABS 0.0 01/14/2024 1043   BASOSABS 0.0 01/06/2015 1505    BMET    Component Value Date/Time   NA 135 01/16/2025 0006   NA 141 01/06/2015 1505   K 4.3 01/16/2025 0006   K 3.9 01/06/2015 1505   CL 102 01/16/2025 0006   CL 107 01/06/2015 1505   CO2 23 01/16/2025 0006   CO2 29 01/06/2015 1505   GLUCOSE 91 01/16/2025 0006   GLUCOSE 111 (H) 01/06/2015 1505   BUN 23 (H) 01/16/2025 0006   BUN 16 01/06/2015 1505   CREATININE 1.15 01/16/2025 0006   CREATININE 1.28 01/06/2015 1505   CALCIUM  9.0 01/16/2025 0006   CALCIUM  9.0 01/06/2015 1505   GFRNONAA >60 01/16/2025 0006   GFRNONAA >60 01/06/2015 1505   GFRNONAA >60 05/10/2013 0535    IMAGING past 24 hours No results found.   Vitals:   01/16/25 1938 01/17/25 0248 01/17/25 1008 01/17/25 1600  BP: 117/71 114/72 104/65 111/72  Pulse: (!) 40 (!) 53    Resp: 17 18  17 17   Temp: 98.2 F (36.8 C) 98.8 F (37.1 C) 98.7 F (37.1 C) 98.6 F (37 C)  TempSrc: Oral Oral Oral Oral  SpO2: 98% 98% 97% 98%  Weight:      Height:        PHYSICAL EXAM General:  Alert, well-nourished, well-developed patient in no acute distress Psych: Smiling to provider appropriately  NEURO:  Mental Status: Patient follows simple commands, nods appropriately.  Speech/Language: No verbal output  Cranial Nerves:  II: PERRL.  III, IV, VI: Left gaze preference, crosses midline VII: Right facial droop VIII: hearing intact to voice. XII: tongue is midline without fasciculations. Motor: LUE and LLE at least 4/5, RUE against gravity bicept 3/5, deltoid 2+/5, able to lift RLE off bed 3/5.  Tone: is normal and bulk is normal Sensation- Intact to light touch bilaterally.  Coordination: Left finger-to-nose intact, right finger to nose not able to complete Gait- deferred   ASSESSMENT/PLAN  Mr. Wynston Romey is a 59 y.o. male with history of HIV, hypertension, substance abuse and alcohol abuse admitted for acute onset right-sided weakness and aphasia.  He was given TNK to treat stroke and was found to  have occlusion of left ICA/MCA, and mechanical thrombectomy was successfully performed.  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 s/p TNK and IR with TICI 3, etiology: Likely large vessel occlusion in the setting of multiple risk factors Code Stroke CT head hyperdense left MCA ASPECTS 7 CTA head & neck left ICA occlusion in the neck, left MCA origin occluded S/p IR with TICI3  MRI acute left MCA territory infarct with confluent petechial hemorrhage, cytotoxic edema and mild regional mass effect with trace rightward midline shift 2D Echo EF 50 to 55% LDL 55 HgbA1c 5.8 UDS positive for cocaine, benzo and fentanyl  VTE prophylaxis - heparin  subcu No antithrombotic prior to admission, continue aspirin  81 mg daily Therapy  recommendations:  CIR Disposition: Pending, difficult placement  Hypertension Home meds: Losartan  100 mg daily Stable Long-term BP goal normotensive  Hyperlipidemia Home meds: Rosuvastatin  20 mg daily, resumed in hospital LDL 55, goal < 70 Continue statin at discharge  Tobacco Abuse Patient smokes 0.25 packs per day for 40 years      Ready to quit? N/A Nicotine  replacement therapy provided  Cocaine abuse Patient uses cocaine UDS positive for Cocaine      Ready to quit? N/A TOC consult for cessation placed  Dysphagia Patient has post-stroke dysphagia, SLP consulted Now on dysphagia 1->3 and thin liquid Aspiration precautions Advance diet as tolerated  Other Stroke Risk Factors Alcohol abuse HIV, on HARRT therapy  Other Active Problems Mild thrombocytopenia, resolved  Hospital day # 21  Ary Cummins, MD PhD Stroke Neurology 01/17/2025 5:04 PM

## 2025-01-17 NOTE — Plan of Care (Signed)
 " Problem: Education: Goal: Knowledge of disease or condition will improve Outcome: Progressing Goal: Knowledge of secondary prevention will improve (MUST DOCUMENT ALL) Outcome: Progressing Goal: Knowledge of patient specific risk factors will improve (DELETE if not current risk factor) Outcome: Progressing   Problem: Ischemic Stroke/TIA Tissue Perfusion: Goal: Complications of ischemic stroke/TIA will be minimized Outcome: Progressing   Problem: Coping: Goal: Will verbalize positive feelings about self Outcome: Progressing Goal: Will identify appropriate support needs Outcome: Progressing   Problem: Health Behavior/Discharge Planning: Goal: Ability to manage health-related needs will improve Outcome: Progressing Goal: Goals will be collaboratively established with patient/family Outcome: Progressing   Problem: Self-Care: Goal: Ability to participate in self-care as condition permits will improve Outcome: Progressing Goal: Verbalization of feelings and concerns over difficulty with self-care will improve Outcome: Progressing Goal: Ability to communicate needs accurately will improve Outcome: Progressing   Problem: Nutrition: Goal: Risk of aspiration will decrease Outcome: Progressing Goal: Dietary intake will improve Outcome: Progressing   Problem: Education: Goal: Knowledge of the prescribed therapeutic regimen will improve Outcome: Progressing   Problem: Bowel/Gastric: Goal: Gastrointestinal status for postoperative course will improve Outcome: Progressing   Problem: Cardiac: Goal: Ability to maintain an adequate cardiac output Outcome: Progressing Goal: Will show no evidence of cardiac arrhythmias Outcome: Progressing   Problem: Nutritional: Goal: Will attain and maintain optimal nutritional status Outcome: Progressing   Problem: Neurological: Goal: Will regain or maintain usual level of consciousness Outcome: Progressing   Problem: Clinical  Measurements: Goal: Ability to maintain clinical measurements within normal limits Outcome: Progressing Goal: Postoperative complications will be avoided or minimized Outcome: Progressing   Problem: Respiratory: Goal: Respiratory status will improve Outcome: Progressing   Problem: Skin Integrity: Goal: Demonstrates signs of wound healing without infection Outcome: Progressing   Problem: Urinary Elimination: Goal: Will remain free from infection Outcome: Progressing Goal: Ability to achieve and maintain adequate urine output Outcome: Progressing   Problem: Education: Goal: Knowledge of General Education information will improve Description: Including pain rating scale, medication(s)/side effects and non-pharmacologic comfort measures Outcome: Progressing   Problem: Health Behavior/Discharge Planning: Goal: Ability to manage health-related needs will improve Outcome: Progressing   Problem: Clinical Measurements: Goal: Ability to maintain clinical measurements within normal limits will improve Outcome: Progressing Goal: Will remain free from infection Outcome: Progressing Goal: Diagnostic test results will improve Outcome: Progressing Goal: Respiratory complications will improve Outcome: Progressing Goal: Cardiovascular complication will be avoided Outcome: Progressing   Problem: Activity: Goal: Risk for activity intolerance will decrease Outcome: Progressing   Problem: Nutrition: Goal: Adequate nutrition will be maintained Outcome: Progressing   Problem: Coping: Goal: Level of anxiety will decrease Outcome: Progressing   Problem: Elimination: Goal: Will not experience complications related to bowel motility Outcome: Progressing Goal: Will not experience complications related to urinary retention Outcome: Progressing   Problem: Pain Managment: Goal: General experience of comfort will improve and/or be controlled Outcome: Progressing   Problem:  Safety: Goal: Ability to remain free from injury will improve Outcome: Progressing   Problem: Skin Integrity: Goal: Risk for impaired skin integrity will decrease Outcome: Progressing   Problem: Education: Goal: Understanding of CV disease, CV risk reduction, and recovery process will improve Outcome: Progressing Goal: Individualized Educational Video(s) Outcome: Progressing   Problem: Activity: Goal: Ability to return to baseline activity level will improve Outcome: Progressing   Problem: Cardiovascular: Goal: Ability to achieve and maintain adequate cardiovascular perfusion will improve Outcome: Progressing Goal: Vascular access site(s) Level 0-1 will be maintained Outcome: Progressing   Problem: Health Behavior/Discharge  Planning: Goal: Ability to safely manage health-related needs after discharge will improve Outcome: Progressing   "

## 2025-01-18 DIAGNOSIS — I63232 Cerebral infarction due to unspecified occlusion or stenosis of left carotid arteries: Secondary | ICD-10-CM | POA: Diagnosis not present

## 2025-01-18 DIAGNOSIS — E785 Hyperlipidemia, unspecified: Secondary | ICD-10-CM | POA: Diagnosis not present

## 2025-01-18 DIAGNOSIS — I10 Essential (primary) hypertension: Secondary | ICD-10-CM | POA: Diagnosis not present

## 2025-01-18 DIAGNOSIS — F1410 Cocaine abuse, uncomplicated: Secondary | ICD-10-CM | POA: Diagnosis not present

## 2025-01-18 DIAGNOSIS — B20 Human immunodeficiency virus [HIV] disease: Secondary | ICD-10-CM | POA: Diagnosis not present

## 2025-01-18 DIAGNOSIS — I69391 Dysphagia following cerebral infarction: Secondary | ICD-10-CM | POA: Diagnosis not present

## 2025-01-18 DIAGNOSIS — F17210 Nicotine dependence, cigarettes, uncomplicated: Secondary | ICD-10-CM | POA: Diagnosis not present

## 2025-01-18 DIAGNOSIS — F1010 Alcohol abuse, uncomplicated: Secondary | ICD-10-CM | POA: Diagnosis not present

## 2025-01-19 DIAGNOSIS — F17210 Nicotine dependence, cigarettes, uncomplicated: Secondary | ICD-10-CM | POA: Diagnosis not present

## 2025-01-19 DIAGNOSIS — I63232 Cerebral infarction due to unspecified occlusion or stenosis of left carotid arteries: Secondary | ICD-10-CM | POA: Diagnosis not present

## 2025-01-19 DIAGNOSIS — I10 Essential (primary) hypertension: Secondary | ICD-10-CM | POA: Diagnosis not present

## 2025-01-19 DIAGNOSIS — I69391 Dysphagia following cerebral infarction: Secondary | ICD-10-CM | POA: Diagnosis not present

## 2025-01-19 DIAGNOSIS — F1410 Cocaine abuse, uncomplicated: Secondary | ICD-10-CM | POA: Diagnosis not present

## 2025-01-19 DIAGNOSIS — B20 Human immunodeficiency virus [HIV] disease: Secondary | ICD-10-CM | POA: Diagnosis not present

## 2025-01-19 DIAGNOSIS — E785 Hyperlipidemia, unspecified: Secondary | ICD-10-CM | POA: Diagnosis not present

## 2025-01-19 DIAGNOSIS — F1010 Alcohol abuse, uncomplicated: Secondary | ICD-10-CM | POA: Diagnosis not present

## 2025-01-19 LAB — GLUCOSE, CAPILLARY: Glucose-Capillary: 100 mg/dL — ABNORMAL HIGH (ref 70–99)

## 2025-01-19 NOTE — Plan of Care (Addendum)
 Patient lying comfortably in bed, watching television. No complaints or distress noted.  Problem: Education: Goal: Knowledge of disease or condition will improve 01/19/2025 1601 by Marissa Shall, RN Outcome: Progressing 01/19/2025 1553 by Marissa Shall, RN Outcome: Progressing   Problem: Ischemic Stroke/TIA Tissue Perfusion: Goal: Complications of ischemic stroke/TIA will be minimized 01/19/2025 1601 by Marissa Shall, RN Outcome: Progressing 01/19/2025 1553 by Marissa Shall, RN Outcome: Progressing   Problem: Coping: Goal: Will verbalize positive feelings about self 01/19/2025 1601 by Marissa Shall, RN Outcome: Progressing 01/19/2025 1553 by Marissa Shall, RN Outcome: Progressing   Problem: Self-Care: Goal: Ability to communicate needs accurately will improve 01/19/2025 1601 by Marissa Shall, RN Outcome: Progressing 01/19/2025 1553 by Marissa Shall, RN Outcome: Progressing   Problem: Nutrition: Goal: Risk of aspiration will decrease 01/19/2025 1601 by Marissa Shall, RN Outcome: Progressing 01/19/2025 1553 by Marissa Shall, RN Outcome: Progressing   Problem: Nutrition: Goal: Dietary intake will improve 01/19/2025 1601 by Marissa Shall, RN Outcome: Progressing 01/19/2025 1553 by Marissa Shall, RN Outcome: Progressing

## 2025-01-19 NOTE — Progress Notes (Signed)
 B STROKE TEAM PROGRESS NOTE   INTERIM HISTORY/SUBJECTIVE Patient is seen in his room with no family at the bedside.  He is awake alert, following commands. Still has expressive aphasia, R sided weakness improving slowly. PT and OT working with him   CBC    Component Value Date/Time   WBC 5.4 01/16/2025 0006   RBC 3.45 (L) 01/16/2025 0006   HGB 11.7 (L) 01/16/2025 0006   HGB 14.4 01/14/2024 1043   HCT 33.6 (L) 01/16/2025 0006   HCT 42.6 01/14/2024 1043   PLT 182 01/16/2025 0006   PLT 220 01/14/2024 1043   MCV 97.4 01/16/2025 0006   MCV 101 (H) 01/14/2024 1043   MCV 99 01/06/2015 1505   MCH 33.9 01/16/2025 0006   MCHC 34.8 01/16/2025 0006   RDW 12.4 01/16/2025 0006   RDW 11.9 01/14/2024 1043   RDW 13.0 01/06/2015 1505   LYMPHSABS 0.9 12/27/2024 0705   LYMPHSABS 1.6 01/14/2024 1043   LYMPHSABS 2.6 01/06/2015 1505   MONOABS 0.4 12/27/2024 0705   MONOABS 0.3 01/06/2015 1505   EOSABS 0.0 12/27/2024 0705   EOSABS 0.1 01/14/2024 1043   EOSABS 0.1 01/06/2015 1505   BASOSABS 0.0 12/27/2024 0705   BASOSABS 0.0 01/14/2024 1043   BASOSABS 0.0 01/06/2015 1505    BMET    Component Value Date/Time   NA 135 01/16/2025 0006   NA 141 01/06/2015 1505   K 4.3 01/16/2025 0006   K 3.9 01/06/2015 1505   CL 102 01/16/2025 0006   CL 107 01/06/2015 1505   CO2 23 01/16/2025 0006   CO2 29 01/06/2015 1505   GLUCOSE 91 01/16/2025 0006   GLUCOSE 111 (H) 01/06/2015 1505   BUN 23 (H) 01/16/2025 0006   BUN 16 01/06/2015 1505   CREATININE 1.15 01/16/2025 0006   CREATININE 1.28 01/06/2015 1505   CALCIUM  9.0 01/16/2025 0006   CALCIUM  9.0 01/06/2015 1505   GFRNONAA >60 01/16/2025 0006   GFRNONAA >60 01/06/2015 1505   GFRNONAA >60 05/10/2013 0535    IMAGING past 24 hours No results found.   Vitals:   01/19/25 0326 01/19/25 0741 01/19/25 1218 01/19/25 1520  BP: 106/71 105/72 105/63 107/63  Pulse: (!) 56 (!) 50 (!) 44 (!) 48  Resp: 18 20 19 16   Temp: 99.1 F (37.3 C) 98.5 F (36.9 C)  98.3 F (36.8 C) 98.3 F (36.8 C)  TempSrc: Oral Oral Oral Oral  SpO2: 98% 99% 98% 100%  Weight:      Height:        PHYSICAL EXAM General:  Alert, well-nourished, well-developed patient in no acute distress Psych: Smiling to provider appropriately  NEURO:  Mental Status: Patient follows simple commands, nods appropriately.  Speech/Language: No verbal output  Cranial Nerves:  II: PERRL.  III, IV, VI: Left gaze preference, crosses midline VII: Right facial droop VIII: hearing intact to voice. XII: tongue is midline without fasciculations. Motor: LUE and LLE at least 4/5, RUE against gravity 3/5, able to lift RLE off bed 3/5.  Tone: is normal and bulk is normal Sensation- Intact to light touch bilaterally.  Coordination: Left finger-to-nose intact, right finger to nose not able to complete Gait- deferred   ASSESSMENT/PLAN  [name] is a 59 y.o. male with history of HIV, hypertension, substance abuse and alcohol abuse admitted for acute onset right-sided weakness and aphasia.  He was given TNK to treat stroke and was found to have occlusion of left ICA/MCA, and mechanical thrombectomy was successfully performed.  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 s/p TNK and IR with TICI 3, etiology: Likely large vessel occlusion in the setting of multiple risk factors Code Stroke CT head hyperdense left MCA ASPECTS 7 CTA head & neck left ICA occlusion in the neck, left MCA origin occluded S/p IR with TICI3  MRI acute left MCA territory infarct with confluent petechial hemorrhage, cytotoxic edema and mild regional mass effect with trace rightward midline shift 2D Echo EF 50 to 55% LDL 55 HgbA1c 5.8 UDS positive for cocaine, benzo and fentanyl  VTE prophylaxis - heparin  subcu No antithrombotic prior to admission, continue aspirin  81 mg daily Therapy recommendations:  CIR Disposition: Pending, difficult  placement  Hypertension Home meds: Losartan  100 mg daily Stable Long-term BP goal normotensive  Hyperlipidemia Home meds: Rosuvastatin  20 mg daily, resumed in hospital LDL 55, goal < 70 Continue statin at discharge  Tobacco Abuse Patient smokes 0.25 packs per day for 40 years      Ready to quit? N/A Nicotine  replacement therapy provided  Cocaine abuse Patient uses cocaine UDS positive for Cocaine      Ready to quit? N/A TOC consult for cessation placed  Dysphagia Patient has post-stroke dysphagia, SLP consulted Now on dysphagia 1->3 and thin liquid Aspiration precautions Advance diet as tolerated  Other Stroke Risk Factors Alcohol abuse HIV, on HARRT therapy  Other Active Problems Mild thrombocytopenia, resolved  Hospital day # 23   Ary Cummins, MD PhD Stroke Neurology 01/19/2025 5:39 PM

## 2025-01-19 NOTE — Progress Notes (Signed)
 Patient was the source of a bloodborne pathogen exposure incident, patient was informed of the event, lab work done per policy.

## 2025-01-19 NOTE — Progress Notes (Signed)
 Physical Therapy Treatment Patient Details Name: Roy [name] MRN: [id number] DOB: 29-Jan-1966 Today's Date: 01/19/2025   History of Present Illness 59 yo M adm 12/27/24 from [facility] rehab center after fall with Rt weakness, AMS. Pt with Lt ICA/MCA CVA s/p Lt ICA/MCA thrombectomy complicated by Rt CFA occlusion from closure device requiring exploration and endarterectomy with angioplasty. Negative pressure dressing Rt groin  PMHx: cocaine & ETOH abuse, HIV, bipolar disorder, CAD    PT Comments  STAR PT/OT Session: At start of session OT assisted pt with getting dressed and getting to recliner. Pt wheeled to hallway where he was able to use Railing on his L side to assist with standing with minA. Pt then able to progress ambulation with use of railing on L and advancement of R LE with modA from PT at posterior knee. Pt able to perform 2 bouts of 25 feet with seated rest break in between. Pt also able to work on weightbearing through R UE standing facing railing in hallway. MD has placed order for AFO to assist in keeping R toes from catching during swing phase of gait. Working towards more independence to be able to discharge to sister's home.     If plan is discharge home, recommend the following: Two people to help with walking and/or transfers;A lot of help with bathing/dressing/bathroom;Assistance with feeding;Assistance with cooking/housework;Direct supervision/assist for financial management;Assist for transportation;Direct supervision/assist for medications management;Supervision due to cognitive status   Can travel by private vehicle     No  Equipment Recommendations  BSC/3in1;Wheelchair (measurements PT);Wheelchair cushion (measurements PT)       Precautions / Restrictions Precautions Precautions: Fall;Other (comment) (Simultaneous filing. User may not have seen previous data.) Recall of Precautions/Restrictions: Impaired (Simultaneous filing. User may not have seen previous  data.) Precaution/Restrictions Comments: foley, aphasia, Rt hemiparesis (Simultaneous filing. User may not have seen previous data.) Restrictions Weight Bearing Restrictions Per Provider Order: No (Simultaneous filing. User may not have seen previous data.)     Mobility  Bed Mobility                    Transfers Overall transfer level: Needs assistance Equipment used:  (railing on L side in the hallway  Transfers: Sit to/from Stand Sit to Stand: Min assist           General transfer comment: pt min A for standing with railing on L pulling up to standing x3 (Simultaneous filing. User may not have seen previous data.)    Ambulation/Gait Ambulation/Gait assistance: Mod assist, +2 safety/equipment Gait Distance (Feet): 25 Feet (x2) Assistive device:  (railing on L side in hallway) Gait Pattern/deviations: Step-to pattern, Decreased step length - right, Decreased dorsiflexion - right, Decreased weight shift to right, Knee hyperextension - right, Narrow base of support Gait velocity: decreased Gait velocity interpretation: <1.31 ft/sec, indicative of household ambulator   General Gait Details: ambulates 2 bouts of 25 feet with PT providing modA at pt posterior R knee to advance R foot      Modified Rankin (Stroke Patients Only) Modified Rankin (Stroke Patients Only) Pre-Morbid Rankin Score: No symptoms Modified Rankin: Moderately severe disability     Balance Overall balance assessment: Needs assistance (Simultaneous filing. User may not have seen previous data.) Sitting-balance support: Feet supported (Simultaneous filing. User may not have seen previous data.) Sitting balance-Leahy Scale: Fair (Simultaneous filing. User may not have seen previous data.)     Standing balance support: Single extremity supported (Simultaneous filing. User may not have seen  previous data.) Standing balance-Leahy Scale: Poor (Simultaneous filing. User may not have seen previous  data.) Standing balance comment: able to stand with use of railining on L side and cga on R (Simultaneous filing. User may not have seen previous data.)                            Communication Communication Communication: Impaired (Simultaneous filing. User may not have seen previous data.) Factors Affecting Communication: Difficulty expressing self (Simultaneous filing. User may not have seen previous data.)  Cognition Arousal: Alert (Simultaneous filing. User may not have seen previous data.) Behavior During Therapy: Flat affect (Simultaneous filing. User may not have seen previous data.)   PT - Cognitive impairments: Difficult to assess Difficult to assess due to: Impaired communication                     PT - Cognition Comments: able to nod yes/no >75% of the time Following commands: Impaired (Simultaneous filing. User may not have seen previous data.) Following commands impaired: Follows one step commands with increased time (Simultaneous filing. User may not have seen previous data.)    Cueing Cueing Techniques: Verbal cues, Gestural cues, Tactile cues (Simultaneous filing. User may not have seen previous data.)  Exercises Other Exercises Other Exercises: R UE weight bearing at railing in hallway, coming down bending elbow and pushing back up to standing    General Comments General comments (skin integrity, edema, etc.): OT assist with getting pt dressed, plan to sit up in recliner for ~45 min after session, MD placing order for AFO now that pt has shoes in room (Simultaneous filing. User may not have seen previous data.)      Pertinent Vitals/Pain Pain Assessment Pain Assessment: No/denies pain (Simultaneous filing. User may not have seen previous data.)     PT Goals (current goals can now be found in the care plan section) Acute Rehab PT Goals PT Goal Formulation: Patient unable to participate in goal setting Time For Goal Achievement:  01/26/25 Potential to Achieve Goals: Good Progress towards PT goals: Progressing toward goals    Frequency    Min 3X/week      PT Plan      Co-evaluation PT/OT/SLP Co-Evaluation/Treatment: Yes Reason for Co-Treatment: Complexity of the patient's impairments (multi-system involvement);For patient/therapist safety (Simultaneous filing. User may not have seen previous data.) PT goals addressed during session: Mobility/safety with mobility;Balance;Proper use of DME (Simultaneous filing. User may not have seen previous data.) OT goals addressed during session: ADL's and self-care;Strengthening/ROM;Proper use of Adaptive equipment and DME      AM-PAC PT 6 Clicks Mobility   Outcome Measure  Help needed turning from your back to your side while in a flat bed without using bedrails?: A Little Help needed moving from lying on your back to sitting on the side of a flat bed without using bedrails?: A Little Help needed moving to and from a bed to a chair (including a wheelchair)?: A Lot Help needed standing up from a chair using your arms (e.g., wheelchair or bedside chair)?: A Lot Help needed to walk in hospital room?: Total Help needed climbing 3-5 steps with a railing? : Total 6 Click Score: 12    End of Session Equipment Utilized During Treatment: Gait belt Activity Tolerance: Patient tolerated treatment well Patient left:  (with OT in room) Nurse Communication: Mobility status PT Visit Diagnosis: Other abnormalities of gait and mobility (R26.89);Difficulty in walking,  not elsewhere classified (R26.2);Hemiplegia and hemiparesis;Other symptoms and signs involving the nervous system (R29.898) Hemiplegia - Right/Left: Right Hemiplegia - dominant/non-dominant: Dominant Hemiplegia - caused by: Cerebral infarction     Time: 1354-1425 PT Time Calculation (min) (ACUTE ONLY): 31 min  Charges:    $Gait Training: 8-22 mins PT General Charges $$ ACUTE PT VISIT: 1 Visit                      Josua Ferrebee B. Fleeta Lapidus PT, DPT Acute Rehabilitation Services Please use secure chat or  Call Office [phone]    Almarie KATHEE Fleeta Fleet 01/19/2025, 2:45 PM

## 2025-01-19 NOTE — Progress Notes (Signed)
 Orthopedic Tech Progress Note Patient Details:  Roy [name] [facility] [birth year] [id number] Called in AFO for RLE Patient ID: [name], male   DOB: Dec 27, 1965, 59 y.o.   MRN: [id number]  [name] [name] 01/19/2025, 2:42 PM

## 2025-01-20 DIAGNOSIS — I69391 Dysphagia following cerebral infarction: Secondary | ICD-10-CM | POA: Diagnosis not present

## 2025-01-20 DIAGNOSIS — I10 Essential (primary) hypertension: Secondary | ICD-10-CM | POA: Diagnosis not present

## 2025-01-20 DIAGNOSIS — E785 Hyperlipidemia, unspecified: Secondary | ICD-10-CM | POA: Diagnosis not present

## 2025-01-20 DIAGNOSIS — B20 Human immunodeficiency virus [HIV] disease: Secondary | ICD-10-CM | POA: Diagnosis not present

## 2025-01-20 DIAGNOSIS — F1010 Alcohol abuse, uncomplicated: Secondary | ICD-10-CM | POA: Diagnosis not present

## 2025-01-20 DIAGNOSIS — F17210 Nicotine dependence, cigarettes, uncomplicated: Secondary | ICD-10-CM | POA: Diagnosis not present

## 2025-01-20 DIAGNOSIS — F1410 Cocaine abuse, uncomplicated: Secondary | ICD-10-CM | POA: Diagnosis not present

## 2025-01-20 DIAGNOSIS — I63232 Cerebral infarction due to unspecified occlusion or stenosis of left carotid arteries: Secondary | ICD-10-CM | POA: Diagnosis not present

## 2025-01-20 NOTE — Progress Notes (Signed)
 Occupational Therapy Treatment Patient Details Name: Roy [name] MRN: [id number] DOB: 1966/02/10 Today's Date: 01/20/2025   History of present illness 59 yo M adm 12/27/24 from [facility] after fall with Rt weakness, AMS. Pt with Lt ICA/MCA CVA s/p Lt ICA/MCA thrombectomy complicated by Rt CFA occlusion from closure device requiring exploration and endarterectomy with angioplasty. Negative pressure dressing Rt groin  PMHx: cocaine & ETOH abuse, HIV, bipolar disorder, CAD   OT comments  [facility] OT/PT session: Patient on BSC upon entry and performed UB/LB dressing seated on BSC with mod assist UB and mod/max assist LB with education on hemi technique. Patient stood from [facility] to right platform walker and was able to maintain balance during toilet hygiene and clothing management. Patient performed mobility in hallway with platform walker with assistance for RLE.  Patient indicated he felt comfortable with platform walker.  Patient returned to bed at end of session with min assist to doff shirt and mod/max assist for pants.  Acute OT to continue to follow with [facility] program to address established goals.       If plan is discharge home, recommend the following:  A lot of help with walking and/or transfers;A lot of help with bathing/dressing/bathroom;Assistance with cooking/housework;Direct supervision/assist for medications management;Direct supervision/assist for financial management;Assist for transportation;Help with stairs or ramp for entrance   Equipment Recommendations  Other (comment) (TBD)    Recommendations for Other Services      Precautions / Restrictions Precautions Precautions: Fall;Other (comment) Recall of Precautions/Restrictions: Impaired Precaution/Restrictions Comments: foley, aphasia, Rt hemiparesis Restrictions Weight Bearing Restrictions Per Provider Order: No       Mobility Bed Mobility Overal bed mobility: Needs Assistance Bed Mobility: Sit to Supine        Sit to supine: Min assist   General bed mobility comments: min assist with RLE to return to supine    Transfers Overall transfer level: Needs assistance Equipment used: Right platform walker Transfers: Sit to/from Stand Sit to Stand: Min assist, +2 safety/equipment           General transfer comment: platform walker utilized for transfer with min assist for sit to stand and assistance to advance RLE     Balance Overall balance assessment: Needs assistance Sitting-balance support: Feet supported Sitting balance-Leahy Scale: Fair Sitting balance - Comments: supervision for safety on EOB   Standing balance support: Bilateral upper extremity supported Standing balance-Leahy Scale: Poor Standing balance comment: BUE support with platform walker                           ADL either performed or assessed with clinical judgement   ADL Overall ADL's : Needs assistance/impaired                 Upper Body Dressing : Moderate assistance;Sitting Upper Body Dressing Details (indicate cue type and reason): education on hemi technique for UB dressing with sweatshirt Lower Body Dressing: Moderate assistance;Maximal assistance;Sit to/from stand Lower Body Dressing Details (indicate cue type and reason): education on hemi technique for donning pants with max assist to thread RLE into pants and min for left, mod assist to pull up clothing while standing Toilet Transfer: Moderate assistance;Cueing for safety;Cueing for sequencing Toilet Transfer Details (indicate cue type and reason): platform walker utilized for standing and transfer Toileting- Clothing Manipulation and Hygiene: Total assistance;Sit to/from stand Toileting - Clothing Manipulation Details (indicate cue type and reason): patient stood at platform walker and required total assist for  toilet hygiene            Extremity/Trunk Assessment              Vision       Perception     Praxis      Communication Communication Communication: Impaired Factors Affecting Communication: Difficulty expressing self   Cognition Arousal: Alert Behavior During Therapy: Flat affect Cognition: Difficult to assess Difficult to assess due to: Impaired communication                             Following commands: Impaired Following commands impaired: Follows one step commands with increased time      Cueing   Cueing Techniques: Verbal cues, Gestural cues, Tactile cues  Exercises      Shoulder Instructions       General Comments patient encouraged to stay in recliner but declines    Pertinent Vitals/ Pain       Pain Assessment Pain Assessment: Faces Faces Pain Scale: Hurts little more Pain Location: R thumb with ROM Pain Descriptors / Indicators: Discomfort, Grimacing Pain Intervention(s): Limited activity within patient's tolerance, Monitored during session, Repositioned  Home Living                                          Prior Functioning/Environment              Frequency  Min 2X/week        Progress Toward Goals  OT Goals(current goals can now be found in the care plan section)  Progress towards OT goals: Progressing toward goals  Acute Rehab OT Goals Patient Stated Goal: unable to state OT Goal Formulation: Patient unable to participate in goal setting Time For Goal Achievement: 01/26/25 Potential to Achieve Goals: Good  Plan      Co-evaluation    PT/OT/SLP Co-Evaluation/Treatment: Yes Reason for Co-Treatment: Complexity of the patient's impairments (multi-system involvement);For patient/therapist safety PT goals addressed during session: Mobility/safety with mobility;Balance;Proper use of DME OT goals addressed during session: ADL's and self-care;Strengthening/ROM;Proper use of Adaptive equipment and DME      AM-PAC OT 6 Clicks Daily Activity     Outcome Measure   Help from another person eating meals?: A  Little Help from another person taking care of personal grooming?: A Little Help from another person toileting, which includes using toliet, bedpan, or urinal?: A Lot Help from another person bathing (including washing, rinsing, drying)?: A Lot Help from another person to put on and taking off regular upper body clothing?: A Lot Help from another person to put on and taking off regular lower body clothing?: A Lot 6 Click Score: 14    End of Session Equipment Utilized During Treatment: Gait belt;Rolling walker (2 wheels);Other (comment) (right platform)  OT Visit Diagnosis: Unsteadiness on feet (R26.81);Other abnormalities of gait and mobility (R26.89);Muscle weakness (generalized) (M62.81);Hemiplegia and hemiparesis;Cognitive communication deficit (R41.841) Symptoms and signs involving cognitive functions: Cerebral infarction Hemiplegia - Right/Left: Right Hemiplegia - dominant/non-dominant: Dominant Hemiplegia - caused by: Cerebral infarction   Activity Tolerance Patient tolerated treatment well   Patient Left in bed;with call bell/phone within reach;with bed alarm set   Nurse Communication Mobility status        Time: 8671-8574 OT Time Calculation (min): 57 min  Charges: OT General Charges $OT Visit: 1 Visit OT Treatments $Self Care/Home Management :  23-37 mins  Dick Laine, OTA Acute Rehabilitation Services  Office [phone]   Jeb LITTIE Laine 01/20/2025, 3:01 PM

## 2025-01-20 NOTE — Progress Notes (Signed)
" °   01/20/25 1514  Assess: MEWS Score  Temp 98 F (36.7 C)  BP 99/63  MAP (mmHg) 75  Pulse Rate (!) 42  Resp 15  SpO2 97 %  O2 Device Room Air  Assess: MEWS Score  MEWS Temp 0  MEWS Systolic 1  MEWS Pulse 1  MEWS RR 0  MEWS LOC 0  MEWS Score 2  MEWS Score Color Yellow  Assess: if the MEWS score is Yellow or Red  Were vital signs accurate and taken at a resting state? Yes  Does the patient meet 2 or more of the SIRS criteria? No  MEWS guidelines implemented  Yes, yellow  Treat  MEWS Interventions Considered administering scheduled or prn medications/treatments as ordered  Take Vital Signs  Increase Vital Sign Frequency  Yellow: Q2hr x1, continue Q4hrs until patient remains green for 12hrs  Escalate  MEWS: Escalate Yellow: Discuss with charge nurse and consider notifying provider and/or RRT  Notify: Charge Nurse/RN  Name of Charge Nurse/RN Notified Tiondra, RN  Provider Notification  Provider Name/Title Dr. Jerri (Dr. is aware of patient being bradycardic)  Date Provider Notified 01/20/25  Time Provider Notified 1520  Method of Notification Page (secure chat)  Notification Reason Other (Comment) (Bradycardia)  Provider response No new orders  Date of Provider Response 01/20/25  Time of Provider Response 1521  Assess: SIRS CRITERIA  SIRS Temperature  0  SIRS Respirations  0  SIRS Pulse 0  SIRS WBC 0  SIRS Score Sum  0    "

## 2025-01-20 NOTE — TOC Progression Note (Signed)
 Transition of Care [facility]) - Progression Note    Patient Details  Name: Roy [name] MRN: [id number] Date of Birth: 1966/10/17  [facility] CM/SW Contact  Almarie CHRISTELLA Goodie, [place] Phone Number: 01/20/2025, 9:59 AM  Clinical Narrative:   CSW following for disposition. Patient picked up by [facility] program, will follow to assist with discharge home once cleared by PT/OT.    Expected Discharge Plan: Skilled Nursing Facility Barriers to Discharge: Insurance Authorization, Inadequate or no insurance, Active Substance Use - Placement, SNF Pending bed offer               Expected Discharge Plan and Services                                               Social Drivers of Health (SDOH) Interventions SDOH Screenings   Food Insecurity: No Food Insecurity (06/03/2024)  Housing: Patient Declined (05/04/2024)  Transportation Needs: Unmet Transportation Needs (06/03/2024)  Utilities: At Risk (06/03/2024)  Depression (PHQ2-9): Low Risk (06/08/2024)  Recent Concern: Depression (PHQ2-9) - High Risk (06/07/2024)  Financial Resource Strain: Medium Risk (01/20/2024)   Received from [facility]  Tobacco Use: High Risk (12/31/2024)  Health Literacy: Low Risk  (07/02/2022)   Received from [facility]    Readmission Risk Interventions     No data to display

## 2025-01-20 NOTE — Progress Notes (Signed)
 Physical Therapy Treatment Patient Details Name: Roy [name] MRN: [id number] DOB: [birth year] Today's Date: 01/20/2025   History of Present Illness 59 yo M adm 12/27/24 from [facility]TOH rehab center after fall with Rt weakness, AMS. Pt with Lt ICA/MCA CVA s/p Lt ICA/MCA thrombectomy complicated by Rt CFA occlusion from closure device requiring exploration and endarterectomy with angioplasty. Negative pressure dressing Rt groin  PMHx: cocaine & ETOH abuse, HIV, bipolar disorder, CAD    PT Comments  [facility] PT/OT session: Pt dressed and up to chair with OT. Eager to walk in hallway with R platform walker. Pt exhibits increased shoulder extension and flexion initiation today, surprising even himself.  Pt requires maxA at R posterior knee to be able to perform knee flexion and LE advancement. Pt able to perform 5 bouts of ambulation with seated rest breaks when R LE fatigues out. Pt is making progress towards his goals. PT will continue to follow acutely.    If plan is discharge home, recommend the following: Two people to help with walking and/or transfers;A lot of help with bathing/dressing/bathroom;Assistance with feeding;Assistance with cooking/housework;Direct supervision/assist for financial management;Assist for transportation;Direct supervision/assist for medications management;Supervision due to cognitive status   Can travel by private vehicle     No  Equipment Recommendations  BSC/3in1;Wheelchair (measurements PT);Wheelchair cushion (measurements PT)       Precautions / Restrictions Precautions Precautions: Fall;Other (comment) Recall of Precautions/Restrictions: Impaired Precaution/Restrictions Comments: foley, aphasia, Rt hemiparesis Restrictions Weight Bearing Restrictions Per Provider Order: No     Mobility  Bed Mobility Overal bed mobility: Needs Assistance Bed Mobility: Supine to Sit, Sit to Supine     Supine to sit: Mod assist, HOB elevated     General bed mobility  comments: assistance with RLE and trunk  to get to EOB and assistance with RLE to return to supine    Transfers Overall transfer level: Needs assistance Equipment used: Right platform walker Transfers: Sit to/from Stand Sit to Stand: Min assist, +2 safety/equipment     Squat pivot transfers: Min assist     General transfer comment: minAx1 or 2 to power up to platform walker and place R UE into trough    Ambulation/Gait Ambulation/Gait assistance: Mod assist, +2 safety/equipment Gait Distance (Feet): 10 Feet (+10, +20, +10, +10) Assistive device: Right platform walker Gait Pattern/deviations: Step-to pattern, Decreased step length - right, Decreased dorsiflexion - right, Decreased weight shift to right, Knee hyperextension - right, Narrow base of support Gait velocity: decreased Gait velocity interpretation: <1.31 ft/sec, indicative of household ambulator   General Gait Details: pt feels confident with the R platform walker, needs max A at R LE for knee flexion and R LE advancement, cues for looking up and out, as well as attempting to reduce R knee hyperextension       Modified Rankin (Stroke Patients Only) Modified Rankin (Stroke Patients Only) Pre-Morbid Rankin Score: No symptoms Modified Rankin: Moderately severe disability     Balance Overall balance assessment: Needs assistance Sitting-balance support: Feet supported Sitting balance-Leahy Scale: Fair Sitting balance - Comments: supervision for safety on EOB   Standing balance support: Single extremity supported Standing balance-Leahy Scale: Poor Standing balance comment: requires UE support for dynamic balance                            Communication Communication Communication: Impaired Factors Affecting Communication: Difficulty expressing self  Cognition Arousal: Alert Behavior During Therapy: Flat affect   PT - Cognitive impairments:  Difficult to assess Difficult to assess due to: Impaired  communication                     PT - Cognition Comments: able to nod yes/no >75% of the time Following commands: Impaired Following commands impaired: Follows one step commands with increased time    Cueing Cueing Techniques: Verbal cues, Gestural cues, Tactile cues  Exercises Other Exercises Other Exercises: R UE weight bearing through elbow on recliner arm, pushing up to upright, x 8    General Comments General comments (skin integrity, edema, etc.): OT assist with dressing prior to walking with PT, pt with more shoulder forward abduction, adduction      Pertinent Vitals/Pain Pain Assessment Pain Assessment: Faces Faces Pain Scale: Hurts little more Pain Location: R thumb with ROM Pain Descriptors / Indicators: Discomfort, Grimacing Pain Intervention(s): Limited activity within patient's tolerance, Monitored during session, Repositioned     PT Goals (current goals can now be found in the care plan section) Acute Rehab PT Goals PT Goal Formulation: Patient unable to participate in goal setting Time For Goal Achievement: 01/26/25 Potential to Achieve Goals: Good Progress towards PT goals: Progressing toward goals    Frequency    Min 3X/week       Co-evaluation PT/OT/SLP Co-Evaluation/Treatment: Yes Reason for Co-Treatment: Complexity of the patient's impairments (multi-system involvement);For patient/therapist safety PT goals addressed during session: Mobility/safety with mobility;Balance;Proper use of DME OT goals addressed during session: ADL's and self-care;Strengthening/ROM;Proper use of Adaptive equipment and DME      AM-PAC PT 6 Clicks Mobility   Outcome Measure  Help needed turning from your back to your side while in a flat bed without using bedrails?: A Little Help needed moving from lying on your back to sitting on the side of a flat bed without using bedrails?: A Little Help needed moving to and from a bed to a chair (including a  wheelchair)?: A Lot Help needed standing up from a chair using your arms (e.g., wheelchair or bedside chair)?: A Lot Help needed to walk in hospital room?: Total Help needed climbing 3-5 steps with a railing? : Total 6 Click Score: 12    End of Session Equipment Utilized During Treatment: Gait belt Activity Tolerance: Patient tolerated treatment well Patient left:  (with OT in room) Nurse Communication: Mobility status PT Visit Diagnosis: Other abnormalities of gait and mobility (R26.89);Difficulty in walking, not elsewhere classified (R26.2);Hemiplegia and hemiparesis;Other symptoms and signs involving the nervous system (R29.898) Hemiplegia - Right/Left: Right Hemiplegia - dominant/non-dominant: Dominant Hemiplegia - caused by: Cerebral infarction     Time: 1340-1410 PT Time Calculation (min) (ACUTE ONLY): 30 min  Charges:    $Gait Training: 23-37 mins PT General Charges $$ ACUTE PT VISIT: 1 Visit                     Thyra Yinger B. Fleeta Lapidus PT, DPT Acute Rehabilitation Services Please use secure chat or  Call Office [phone]    Almarie KATHEE Fleeta [facility] 01/20/2025, 2:58 PM

## 2025-01-20 NOTE — Plan of Care (Signed)
 Patient HR in low 40s , making MEWS yellow. MD aware. He looks fine, lying comfortably in bed. No complaints or sign of distress.  Problem: Ischemic Stroke/TIA Tissue Perfusion: Goal: Complications of ischemic stroke/TIA will be minimized 01/20/2025 1659 by Marissa Shall, RN Outcome: Progressing  Problem: Coping: Goal: Will identify appropriate support needs 01/20/2025 1659 by Marissa Shall, RN Outcome: Progressing  Problem: Nutrition: Goal: Risk of aspiration will decrease 01/20/2025 1659 by Marissa Shall, RN Outcome: Progressing   Problem: Nutritional: Goal: Will attain and maintain optimal nutritional status 01/20/2025 1659 by Marissa Shall, RN Outcome: Progressing   Problem: Urinary Elimination: Goal: Will remain free from infection 01/20/2025 1659 by Marissa Shall, RN Outcome: Progressing

## 2025-01-20 NOTE — Progress Notes (Addendum)
 B STROKE TEAM PROGRESS NOTE   INTERIM HISTORY/SUBJECTIVE Patient is seen in his room with no family at the bedside.  He remains hemodynamically stable and afebrile, and his neurological exam remains stable.  He is working with PT and OT and will hopefully be discharged home with his sister.  CBC    Component Value Date/Time   WBC 5.4 01/16/2025 0006   RBC 3.45 (L) 01/16/2025 0006   HGB 11.7 (L) 01/16/2025 0006   HGB 14.4 01/14/2024 1043   HCT 33.6 (L) 01/16/2025 0006   HCT 42.6 01/14/2024 1043   PLT 182 01/16/2025 0006   PLT 220 01/14/2024 1043   MCV 97.4 01/16/2025 0006   MCV 101 (H) 01/14/2024 1043   MCV 99 01/06/2015 1505   MCH 33.9 01/16/2025 0006   MCHC 34.8 01/16/2025 0006   RDW 12.4 01/16/2025 0006   RDW 11.9 01/14/2024 1043   RDW 13.0 01/06/2015 1505   LYMPHSABS 0.9 12/27/2024 0705   LYMPHSABS 1.6 01/14/2024 1043   LYMPHSABS 2.6 01/06/2015 1505   MONOABS 0.4 12/27/2024 0705   MONOABS 0.3 01/06/2015 1505   EOSABS 0.0 12/27/2024 0705   EOSABS 0.1 01/14/2024 1043   EOSABS 0.1 01/06/2015 1505   BASOSABS 0.0 12/27/2024 0705   BASOSABS 0.0 01/14/2024 1043   BASOSABS 0.0 01/06/2015 1505    BMET    Component Value Date/Time   NA 135 01/16/2025 0006   NA 141 01/06/2015 1505   K 4.3 01/16/2025 0006   K 3.9 01/06/2015 1505   CL 102 01/16/2025 0006   CL 107 01/06/2015 1505   CO2 23 01/16/2025 0006   CO2 29 01/06/2015 1505   GLUCOSE 91 01/16/2025 0006   GLUCOSE 111 (H) 01/06/2015 1505   BUN 23 (H) 01/16/2025 0006   BUN 16 01/06/2015 1505   CREATININE 1.15 01/16/2025 0006   CREATININE 1.28 01/06/2015 1505   CALCIUM  9.0 01/16/2025 0006   CALCIUM  9.0 01/06/2015 1505   GFRNONAA >60 01/16/2025 0006   GFRNONAA >60 01/06/2015 1505   GFRNONAA >60 05/10/2013 0535    IMAGING past 24 hours No results found.   Vitals:   01/20/25 0033 01/20/25 0315 01/20/25 0750 01/20/25 1128  BP: 105/65 107/68 111/60 113/68  Pulse: (!) 38 (!) 41 (!) 41 (!) 42  Resp: 17 18 15 15    Temp: 98.4 F (36.9 C) 98.6 F (37 C) 97.9 F (36.6 C) 98.7 F (37.1 C)  TempSrc: Oral Oral    SpO2: 99% 99% 98% 98%  Weight:      Height:        PHYSICAL EXAM General:  Alert, well-nourished, well-developed patient in no acute distress Psych: Smiling to provider appropriately  NEURO:  Mental Status: Patient follows simple commands, nods appropriately.  Speech/Language: Able to say no but no other verbal output  Cranial Nerves:  II: PERRL.  III, IV, VI: Extraocular movements intact VII: Right facial droop VIII: hearing intact to voice. XII: tongue is midline without fasciculations. Motor: LUE and LLE at least 4/5, unable to lift right upper extremity today, able to lift RLE off bed 3/5.  Tone: is normal and bulk is normal Sensation- Intact to light touch bilaterally.  Coordination: Left finger-to-nose intact, right finger to nose not able to complete Gait- deferred   ASSESSMENT/PLAN  Roy [name] is a 59 y.o. male with history of HIV, hypertension, substance abuse and alcohol abuse admitted for acute onset right-sided weakness and aphasia.  He was given TNK to treat stroke and was  found to have occlusion of left ICA/MCA, and mechanical thrombectomy was successfully performed.  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 s/p TNK and IR with TICI 3, etiology: Likely large vessel occlusion in the setting of multiple risk factors Code Stroke CT head hyperdense left MCA ASPECTS 7 CTA head & neck left ICA occlusion in the neck, left MCA origin occluded S/p IR with TICI3  MRI acute left MCA territory infarct with confluent petechial hemorrhage, cytotoxic edema and mild regional mass effect with trace rightward midline shift 2D Echo EF 50 to 55% LDL 55 HgbA1c 5.8 UDS positive for cocaine, benzo and fentanyl  VTE prophylaxis - heparin  subcu No antithrombotic prior to admission, continue aspirin  81 mg daily Therapy  recommendations:  CIR Disposition: Pending, difficult placement  Hypertension Home meds: Losartan  100 mg daily Stable Long-term BP goal normotensive  Hyperlipidemia Home meds: Rosuvastatin  20 mg daily, resumed in hospital LDL 55, goal < 70 Continue statin at discharge  Tobacco Abuse Patient smokes 0.25 packs per day for 40 years      Ready to quit? N/A Nicotine  replacement therapy provided  Cocaine abuse Patient uses cocaine UDS positive for Cocaine      Ready to quit? N/A TOC consult for cessation placed  Dysphagia Patient has post-stroke dysphagia, SLP consulted Now on dysphagia 1->3 and thin liquid Aspiration precautions Advance diet as tolerated  Other Stroke Risk Factors Alcohol abuse HIV, on HARRT therapy  Other Active Problems Mild thrombocytopenia, resolved  Hospital day # 24  Patient seen by NP and then by MD, MD to edit note as needed.  Cortney E Everitt Clint Kill , MSN, AGACNP-BC [facility] See Amion for schedule and pager information 01/20/2025 1:14 PM  ATTENDING NOTE: I reviewed above note and agree with the assessment and plan. Pt was seen and examined.   No family at bedside.  Patient mildly lethargic, however eyes open, awake alert, follows simple commands.  Still has expressive aphasia.  Right upper extremity 2/5, right lower extremity is 3/5.  Continue working with PT and OT, hopefully DC home with sister once more improved.  Continue aspirin  and statin.  For detailed assessment and plan, please refer to above as I have made changes wherever appropriate.   Ary Cummins, MD PhD Stroke Neurology 01/20/2025 3:46 PM

## 2025-01-20 NOTE — Progress Notes (Signed)
 Received call from CCMD. Patient HR is in low 30s. Patient assessed, found asymptomatic. MD aware.

## 2025-01-21 DIAGNOSIS — F1010 Alcohol abuse, uncomplicated: Secondary | ICD-10-CM | POA: Diagnosis not present

## 2025-01-21 DIAGNOSIS — I10 Essential (primary) hypertension: Secondary | ICD-10-CM | POA: Diagnosis not present

## 2025-01-21 DIAGNOSIS — F17210 Nicotine dependence, cigarettes, uncomplicated: Secondary | ICD-10-CM | POA: Diagnosis not present

## 2025-01-21 DIAGNOSIS — B20 Human immunodeficiency virus [HIV] disease: Secondary | ICD-10-CM | POA: Diagnosis not present

## 2025-01-21 DIAGNOSIS — E785 Hyperlipidemia, unspecified: Secondary | ICD-10-CM | POA: Diagnosis not present

## 2025-01-21 DIAGNOSIS — F1410 Cocaine abuse, uncomplicated: Secondary | ICD-10-CM | POA: Diagnosis not present

## 2025-01-21 DIAGNOSIS — I69391 Dysphagia following cerebral infarction: Secondary | ICD-10-CM | POA: Diagnosis not present

## 2025-01-21 DIAGNOSIS — I63232 Cerebral infarction due to unspecified occlusion or stenosis of left carotid arteries: Secondary | ICD-10-CM | POA: Diagnosis not present

## 2025-01-21 NOTE — Progress Notes (Addendum)
 STROKE TEAM PROGRESS NOTE   INTERIM HISTORY/SUBJECTIVE Patient is seen in his room with no family at the bedside.  He continues to be hemodynamically stable and afebrile.  He is working with PT and OT on a daily basis and will hopefully be discharged home with his sister.  CBC    Component Value Date/Time   WBC 5.4 01/16/2025 0006   RBC 3.45 (L) 01/16/2025 0006   HGB 11.7 (L) 01/16/2025 0006   HGB 14.4 01/14/2024 1043   HCT 33.6 (L) 01/16/2025 0006   HCT 42.6 01/14/2024 1043   PLT 182 01/16/2025 0006   PLT 220 01/14/2024 1043   MCV 97.4 01/16/2025 0006   MCV 101 (H) 01/14/2024 1043   MCV 99 01/06/2015 1505   MCH 33.9 01/16/2025 0006   MCHC 34.8 01/16/2025 0006   RDW 12.4 01/16/2025 0006   RDW 11.9 01/14/2024 1043   RDW 13.0 01/06/2015 1505   LYMPHSABS 0.9 12/27/2024 0705   LYMPHSABS 1.6 01/14/2024 1043   LYMPHSABS 2.6 01/06/2015 1505   MONOABS 0.4 12/27/2024 0705   MONOABS 0.3 01/06/2015 1505   EOSABS 0.0 12/27/2024 0705   EOSABS 0.1 01/14/2024 1043   EOSABS 0.1 01/06/2015 1505   BASOSABS 0.0 12/27/2024 0705   BASOSABS 0.0 01/14/2024 1043   BASOSABS 0.0 01/06/2015 1505    BMET    Component Value Date/Time   NA 135 01/16/2025 0006   NA 141 01/06/2015 1505   K 4.3 01/16/2025 0006   K 3.9 01/06/2015 1505   CL 102 01/16/2025 0006   CL 107 01/06/2015 1505   CO2 23 01/16/2025 0006   CO2 29 01/06/2015 1505   GLUCOSE 91 01/16/2025 0006   GLUCOSE 111 (H) 01/06/2015 1505   BUN 23 (H) 01/16/2025 0006   BUN 16 01/06/2015 1505   CREATININE 1.15 01/16/2025 0006   CREATININE 1.28 01/06/2015 1505   CALCIUM  9.0 01/16/2025 0006   CALCIUM  9.0 01/06/2015 1505   GFRNONAA >60 01/16/2025 0006   GFRNONAA >60 01/06/2015 1505   GFRNONAA >60 05/10/2013 0535    IMAGING past 24 hours No results found.   Vitals:   01/21/25 0000 01/21/25 0534 01/21/25 0739 01/21/25 1130  BP: 107/71 120/73 122/72 110/63  Pulse: (!) 38 (!) 45 (!) 40 (!) 44  Resp: 18 18 14 16   Temp: 98.1 F (36.7  C) 98 F (36.7 C) 98.5 F (36.9 C) 98.4 F (36.9 C)  TempSrc: Oral Oral Oral Oral  SpO2: 99% 98% 99% 100%  Weight:      Height:        PHYSICAL EXAM General:  Alert, well-nourished, well-developed patient in no acute distress Psych: Smiling to provider appropriately  NEURO:  Mental Status: Patient follows simple commands, nods appropriately.  Speech/Language: Able to say no but no other verbal output  Cranial Nerves:  II: PERRL.  III, IV, VI: Extraocular movements intact VII: Right facial droop VIII: hearing intact to voice. XII: tongue is midline without fasciculations. Motor: LUE and LLE at least 4/5, unable to lift right upper extremity today, able to lift RLE off bed 3/5.  Tone: is normal and bulk is normal Sensation- Intact to light touch bilaterally.  Coordination: Left finger-to-nose intact, right finger to nose not able to complete Gait- deferred   ASSESSMENT/PLAN  [name] is a 59 y.o. male with history of HIV, hypertension, substance abuse and alcohol abuse admitted for acute onset right-sided weakness and aphasia.  He was given TNK to treat stroke and was found  to have occlusion of left ICA/MCA, and mechanical thrombectomy was successfully performed.  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 s/p TNK and IR with TICI 3, etiology: Likely large vessel occlusion in the setting of multiple risk factors Code Stroke CT head hyperdense left MCA ASPECTS 7 CTA head & neck left ICA occlusion in the neck, left MCA origin occluded S/p IR with TICI3  MRI acute left MCA territory infarct with confluent petechial hemorrhage, cytotoxic edema and mild regional mass effect with trace rightward midline shift 2D Echo EF 50 to 55% LDL 55 HgbA1c 5.8 UDS positive for cocaine, benzo and fentanyl  VTE prophylaxis - heparin  subcu No antithrombotic prior to admission, continue aspirin  81 mg daily Therapy  recommendations:  CIR Disposition: Pending, difficult placement  Hypertension Home meds: Losartan  100 mg daily Stable Long-term BP goal normotensive  Hyperlipidemia Home meds: Rosuvastatin  20 mg daily, resumed in hospital LDL 55, goal < 70 Continue statin at discharge  Tobacco Abuse Patient smokes 0.25 packs per day for 40 years      Ready to quit? N/A Nicotine  replacement therapy provided  Cocaine abuse Patient uses cocaine UDS positive for Cocaine      Ready to quit? N/A TOC consult for cessation placed  Dysphagia Patient has post-stroke dysphagia, SLP consulted Now on dysphagia 1->3 and thin liquid Aspiration precautions Advance diet as tolerated  Other Stroke Risk Factors Alcohol abuse HIV, on HARRT therapy  Other Active Problems Mild thrombocytopenia, resolved  Hospital day # 25  Patient seen by NP and then by MD, MD to edit note as needed.  Roy [name] Roy [name] , MSN, AGACNP-BC [facility] See Amion for schedule and pager information 01/21/2025 2:04 PM  ATTENDING NOTE: I reviewed above note and agree with the assessment and plan. Pt was seen and examined.   No acute change overnight. Continue current management and pending placement. PT and OT and speech  For detailed assessment and plan, please refer to above as I have made changes wherever appropriate.   Roy Cummins, MD PhD Stroke Neurology 01/21/2025 10:06 PM

## 2025-01-21 NOTE — Progress Notes (Signed)
 Occupational Therapy Treatment Patient Details Name: Roy [name] MRN: [id number] DOB: 03-Dec-1966 Today's Date: 01/21/2025   History of present illness 59 yo M adm 12/27/24 from [facility] after fall with Rt weakness, AMS. Pt with Lt ICA/MCA CVA s/p Lt ICA/MCA thrombectomy complicated by Rt CFA occlusion from closure device requiring exploration and endarterectomy with angioplasty. Negative pressure dressing Rt groin  PMHx: cocaine & ETOH abuse, HIV, bipolar disorder, CAD   OT comments  [facility] OT/PT session: Patient demonstrating gains with getting to EOB using LUE and LLE to assist getting RLE off EOB and assistance with trunk. Patient able to ambulate to sink with right platform walker and performed grooming standing at sink with min assist.  Patient indicated he needed BSC and was able to assist with toilet hygiene but continues to require assistance to complete. Patient performed mobility in hallway with right platform walker with assistance for RLE and placing RUE on platform. Patient was provided setup for self feeding and assisted back to supine with min assist. Acute OT to continue to follow with [facility] program to address established goals.       If plan is discharge home, recommend the following:  A lot of help with walking and/or transfers;A lot of help with bathing/dressing/bathroom;Assistance with cooking/housework;Direct supervision/assist for medications management;Direct supervision/assist for financial management;Assist for transportation;Help with stairs or ramp for entrance   Equipment Recommendations  Other (comment) (TBD)    Recommendations for Other Services      Precautions / Restrictions Precautions Precautions: Fall;Other (comment) Recall of Precautions/Restrictions: Impaired Precaution/Restrictions Comments: foley, aphasia, Rt hemiparesis Restrictions Weight Bearing Restrictions Per Provider Order: No       Mobility Bed Mobility Overal bed mobility:  Needs Assistance Bed Mobility: Supine to Sit     Supine to sit: Min assist, HOB elevated     General bed mobility comments: patient able to use LLE and LUE to move RLE towards EOB    Transfers Overall transfer level: Needs assistance Equipment used: Right platform walker Transfers: Sit to/from Stand Sit to Stand: Min assist, +2 safety/equipment, Mod assist           General transfer comment: platform walker utilized for transfer with min assist for sit to stand and assistance to bring RUE onto trough     Balance Overall balance assessment: Needs assistance Sitting-balance support: Feet supported Sitting balance-Leahy Scale: Fair Sitting balance - Comments: supervision for safety on EOB   Standing balance support: Bilateral upper extremity supported Standing balance-Leahy Scale: Poor Standing balance comment: BUE support with platform walker                           ADL either performed or assessed with clinical judgement   ADL Overall ADL's : Needs assistance/impaired Eating/Feeding: Set up Eating/Feeding Details (indicate cue type and reason): assistance to open containers Grooming: Wash/dry hands;Wash/dry face;Oral care;Minimal assistance;Standing Grooming Details (indicate cue type and reason): assistance with opening toothpaste, CGA for balance with platform walker                 Toilet Transfer: Moderate assistance;Cueing for safety;Cueing for sequencing Toilet Transfer Details (indicate cue type and reason): platform walker utilized for standing and transfer Toileting- Clothing Manipulation and Hygiene: Maximal assistance;Sit to/from stand Toileting - Clothing Manipulation Details (indicate cue type and reason): pt stood at platform walker and was able to assist with toilet hygiene and required max assist to complete  Extremity/Trunk Assessment              Diplomatic Services Operational Officer  Communication Communication: Impaired Factors Affecting Communication: Difficulty expressing self   Cognition Arousal: Alert Behavior During Therapy: Flat affect Cognition: Difficult to assess Difficult to assess due to: Impaired communication                             Following commands: Impaired Following commands impaired: Follows one step commands with increased time      Cueing   Cueing Techniques: Verbal cues, Gestural cues, Tactile cues  Exercises      Shoulder Instructions       General Comments Stood at sink with RUE support via Rt PFRW and CGA while performing ADL tasks with OT and LUE    Pertinent Vitals/ Pain       Pain Assessment Pain Assessment: Faces Faces Pain Scale: No hurt Pain Intervention(s): Monitored during session  Home Living                                          Prior Functioning/Environment              Frequency  Min 2X/week        Progress Toward Goals  OT Goals(current goals can now be found in the care plan section)  Progress towards OT goals: Progressing toward goals  Acute Rehab OT Goals Patient Stated Goal: Patient unable to state OT Goal Formulation: Patient unable to participate in goal setting Time For Goal Achievement: 01/26/25 Potential to Achieve Goals: Good  Plan      Co-evaluation    PT/OT/SLP Co-Evaluation/Treatment: Yes Reason for Co-Treatment: Complexity of the patient's impairments (multi-system involvement);For patient/therapist safety PT goals addressed during session: Mobility/safety with mobility;Balance;Proper use of DME OT goals addressed during session: ADL's and self-care;Strengthening/ROM;Proper use of Adaptive equipment and DME      AM-PAC OT 6 Clicks Daily Activity     Outcome Measure   Help from another person eating meals?: A Little Help from another person taking care of personal grooming?: A Little Help from another person toileting, which includes  using toliet, bedpan, or urinal?: A Lot Help from another person bathing (including washing, rinsing, drying)?: A Lot Help from another person to put on and taking off regular upper body clothing?: A Lot Help from another person to put on and taking off regular lower body clothing?: A Lot 6 Click Score: 14    End of Session Equipment Utilized During Treatment: Gait belt;Rolling walker (2 wheels);Other (comment) (right platform walker)  OT Visit Diagnosis: Unsteadiness on feet (R26.81);Other abnormalities of gait and mobility (R26.89);Muscle weakness (generalized) (M62.81);Hemiplegia and hemiparesis;Cognitive communication deficit (R41.841) Symptoms and signs involving cognitive functions: Cerebral infarction Hemiplegia - Right/Left: Right Hemiplegia - dominant/non-dominant: Dominant Hemiplegia - caused by: Cerebral infarction   Activity Tolerance Patient tolerated treatment well   Patient Left in bed;with call bell/phone within reach;with bed alarm set   Nurse Communication Mobility status        Time: 9174-9078 OT Time Calculation (min): 56 min  Charges: OT General Charges $OT Visit: 1 Visit OT Treatments $Self Care/Home Management : 23-37 mins  Dick Laine, OTA Acute Rehabilitation Services  Office [phone]   Jeb LITTIE Laine 01/21/2025, 12:02  PM

## 2025-01-21 NOTE — Progress Notes (Signed)
 Physical Therapy Treatment Patient Details Name: Roy [name] MRN: [id number] DOB: Nov 30, 1966 Today's Date: 01/21/2025   History of Present Illness 59 yo M adm 12/27/24 from [facility] rehab center after fall with Rt weakness, AMS. Pt with Lt ICA/MCA CVA s/p Lt ICA/MCA thrombectomy complicated by Rt CFA occlusion from closure device requiring exploration and endarterectomy with angioplasty. Negative pressure dressing Rt groin  PMHx: cocaine & ETOH abuse, HIV, bipolar disorder, CAD    PT Comments  Patient easily agreed to working with PT/OT for co-treatment session. Noted incr ability to move RLE towards rt EOB with supine to sit (requiring min assist). Sit to stand from EOB and from elevated recliner (pillow in seat) with varying assist (min to mod) of 1 for boosting and 2nd person for managing RUE up to trough of Rt PFRW. Ambulates with +2 mod assist with assist to move RW and max assist to advance RLE. Pt able to initiate knee flexion and heel lift, however requires assist to advance RLE. Frequent cues for sequencing (including COTA blocking LLE with his foot to prevent leading with LLE). Ambulated x 3 reps with max distance 24 ft. Patient agreed to sit up in recliner at end of session with OT setting pt up with his breakfast.    If plan is discharge home, recommend the following: Two people to help with walking and/or transfers;A lot of help with bathing/dressing/bathroom;Assistance with feeding;Assistance with cooking/housework;Direct supervision/assist for financial management;Assist for transportation;Direct supervision/assist for medications management;Supervision due to cognitive status   Can travel by private vehicle     No  Equipment Recommendations  BSC/3in1;Wheelchair (measurements PT);Wheelchair cushion (measurements PT)    Recommendations for Other Services       Precautions / Restrictions Precautions Precautions: Fall;Other (comment) Recall of Precautions/Restrictions:  Impaired Precaution/Restrictions Comments: foley, aphasia, Rt hemiparesis Restrictions Weight Bearing Restrictions Per Provider Order: No     Mobility  Bed Mobility Overal bed mobility: Needs Assistance Bed Mobility: Supine to Sit     Supine to sit: Min assist, HOB elevated     General bed mobility comments: min assist with RLE and balance for torso as coming to sit    Transfers Overall transfer level: Needs assistance Equipment used: Right platform walker Transfers: Sit to/from Stand Sit to Stand: Min assist, +2 safety/equipment, Mod assist           General transfer comment: platform walker utilized for transfer with min assist for sit to stand and assistance to bring RUE onto trough    Ambulation/Gait Ambulation/Gait assistance: Mod assist, +2 safety/equipment Gait Distance (Feet): 5 Feet (to sink, seated rest; 18 ft; seated rest; 24 ft) Assistive device: Right platform walker Gait Pattern/deviations: Step-to pattern, Decreased step length - right, Decreased dorsiflexion - right, Decreased weight shift to right, Narrow base of support Gait velocity: decreased Gait velocity interpretation: <1.31 ft/sec, indicative of household ambulator   General Gait Details: pt feels confident with the R platform walker, needs max A at R LE for knee flexion and R LE advancement, tactile cues to lead with RLE (frequently trying to step LLE first); cues for looking up and out; no Rt knee hyperextension noted   Stairs             Wheelchair Mobility     Tilt Bed    Modified Rankin (Stroke Patients Only) Modified Rankin (Stroke Patients Only) Pre-Morbid Rankin Score: No symptoms Modified Rankin: Moderately severe disability     Balance Overall balance assessment: Needs assistance Sitting-balance support: Feet supported  Sitting balance-Leahy Scale: Fair Sitting balance - Comments: supervision for safety on EOB   Standing balance support: Bilateral upper extremity  supported Standing balance-Leahy Scale: Poor Standing balance comment: BUE support with platform walker                            Communication Communication Communication: Impaired Factors Affecting Communication: Difficulty expressing self  Cognition Arousal: Alert Behavior During Therapy: Flat affect   PT - Cognitive impairments: Difficult to assess Difficult to assess due to: Impaired communication                     PT - Cognition Comments: able to nod yes/no with ?accuracy Following commands: Impaired Following commands impaired: Follows one step commands with increased time    Cueing Cueing Techniques: Verbal cues, Gestural cues, Tactile cues  Exercises      General Comments General comments (skin integrity, edema, etc.): Stood at sink with RUE support via Rt PFRW and CGA while performing ADL tasks with OT and LUE      Pertinent Vitals/Pain Pain Assessment Pain Assessment: Faces Faces Pain Scale: No hurt    Home Living                          Prior Function            PT Goals (current goals can now be found in the care plan section) Acute Rehab PT Goals Patient Stated Goal: walk Time For Goal Achievement: 01/26/25 Potential to Achieve Goals: Good Progress towards PT goals: Progressing toward goals    Frequency    Min 3X/week      PT Plan      Co-evaluation PT/OT/SLP Co-Evaluation/Treatment: Yes Reason for Co-Treatment: Complexity of the patient's impairments (multi-system involvement);For patient/therapist safety PT goals addressed during session: Mobility/safety with mobility;Balance;Proper use of DME        AM-PAC PT 6 Clicks Mobility   Outcome Measure  Help needed turning from your back to your side while in a flat bed without using bedrails?: A Little Help needed moving from lying on your back to sitting on the side of a flat bed without using bedrails?: A Little Help needed moving to and from a bed to  a chair (including a wheelchair)?: A Lot Help needed standing up from a chair using your arms (e.g., wheelchair or bedside chair)?: A Lot Help needed to walk in hospital room?: Total Help needed climbing 3-5 steps with a railing? : Total 6 Click Score: 12    End of Session Equipment Utilized During Treatment: Gait belt Activity Tolerance: Patient tolerated treatment well Patient left: in chair (with OT in room) Nurse Communication: Mobility status PT Visit Diagnosis: Other abnormalities of gait and mobility (R26.89);Difficulty in walking, not elsewhere classified (R26.2);Hemiplegia and hemiparesis;Other symptoms and signs involving the nervous system (R29.898) Hemiplegia - Right/Left: Right Hemiplegia - dominant/non-dominant: Dominant Hemiplegia - caused by: Cerebral infarction     Time: 0825-0903 PT Time Calculation (min) (ACUTE ONLY): 38 min  Charges:    $Gait Training: 23-37 mins PT General Charges $$ ACUTE PT VISIT: 1 Visit                      Roy RAMAN, PT Acute Rehabilitation Services  Office [phone]    Roy [name] 01/21/2025, 9:14 AM

## 2025-01-21 NOTE — Plan of Care (Signed)
 Patient ID: Roy [name], male   DOB: 08/05/1966, 59 y.o.   MRN: [id number]  Problem: Education: Goal: Knowledge of disease or condition will improve Outcome: Progressing Goal: Knowledge of secondary prevention will improve (MUST DOCUMENT ALL) Outcome: Progressing Goal: Knowledge of patient specific risk factors will improve (DELETE if not current risk factor) Outcome: Progressing   Problem: Ischemic Stroke/TIA Tissue Perfusion: Goal: Complications of ischemic stroke/TIA will be minimized Outcome: Progressing   Problem: Coping: Goal: Will verbalize positive feelings about self Outcome: Progressing Goal: Will identify appropriate support needs Outcome: Progressing   Problem: Health Behavior/Discharge Planning: Goal: Ability to manage health-related needs will improve Outcome: Progressing Goal: Goals will be collaboratively established with patient/family Outcome: Progressing   Problem: Self-Care: Goal: Ability to participate in self-care as condition permits will improve Outcome: Progressing Goal: Verbalization of feelings and concerns over difficulty with self-care will improve Outcome: Progressing Goal: Ability to communicate needs accurately will improve Outcome: Progressing   Problem: Nutrition: Goal: Risk of aspiration will decrease Outcome: Progressing Goal: Dietary intake will improve Outcome: Progressing   Problem: Education: Goal: Knowledge of the prescribed therapeutic regimen will improve Outcome: Progressing   Problem: Bowel/Gastric: Goal: Gastrointestinal status for postoperative course will improve Outcome: Progressing   Problem: Cardiac: Goal: Ability to maintain an adequate cardiac output Outcome: Progressing Goal: Will show no evidence of cardiac arrhythmias Outcome: Progressing   Problem: Nutritional: Goal: Will attain and maintain optimal nutritional status Outcome: Progressing   Problem: Neurological: Goal: Will regain or maintain  usual level of consciousness Outcome: Progressing   Problem: Clinical Measurements: Goal: Ability to maintain clinical measurements within normal limits Outcome: Progressing Goal: Postoperative complications will be avoided or minimized Outcome: Progressing   Problem: Respiratory: Goal: Respiratory status will improve Outcome: Progressing   Problem: Skin Integrity: Goal: Demonstrates signs of wound healing without infection Outcome: Progressing   Problem: Urinary Elimination: Goal: Will remain free from infection Outcome: Progressing Goal: Ability to achieve and maintain adequate urine output Outcome: Progressing   Problem: Education: Goal: Knowledge of General Education information will improve Description: Including pain rating scale, medication(s)/side effects and non-pharmacologic comfort measures Outcome: Progressing   Problem: Health Behavior/Discharge Planning: Goal: Ability to manage health-related needs will improve Outcome: Progressing   Problem: Clinical Measurements: Goal: Ability to maintain clinical measurements within normal limits will improve Outcome: Progressing Goal: Will remain free from infection Outcome: Progressing Goal: Diagnostic test results will improve Outcome: Progressing Goal: Respiratory complications will improve Outcome: Progressing Goal: Cardiovascular complication will be avoided Outcome: Progressing   Problem: Activity: Goal: Risk for activity intolerance will decrease Outcome: Progressing   Problem: Nutrition: Goal: Adequate nutrition will be maintained Outcome: Progressing   Problem: Coping: Goal: Level of anxiety will decrease Outcome: Progressing   Problem: Elimination: Goal: Will not experience complications related to bowel motility Outcome: Progressing Goal: Will not experience complications related to urinary retention Outcome: Progressing   Problem: Pain Managment: Goal: General experience of comfort will  improve and/or be controlled Outcome: Progressing   Problem: Safety: Goal: Ability to remain free from injury will improve Outcome: Progressing   Problem: Skin Integrity: Goal: Risk for impaired skin integrity will decrease Outcome: Progressing   Problem: Education: Goal: Understanding of CV disease, CV risk reduction, and recovery process will improve Outcome: Progressing Goal: Individualized Educational Video(s) Outcome: Progressing   Problem: Activity: Goal: Ability to return to baseline activity level will improve Outcome: Progressing   Problem: Cardiovascular: Goal: Ability to achieve and maintain adequate cardiovascular perfusion will improve Outcome: Progressing  Goal: Vascular access site(s) Level 0-1 will be maintained Outcome: Progressing   Problem: Health Behavior/Discharge Planning: Goal: Ability to safely manage health-related needs after discharge will improve Outcome: Progressing    Roy JONETTA Collier, RN

## 2025-01-22 NOTE — TOC Progression Note (Signed)
 Transition of Care [facility]) - Progression Note    Patient Details  Name: Roy [name] MRN: [id number] Date of Birth: Aug 12, 1966  Transition of Care [facility]) CM/SW Contact  Andrez JULIANNA George, RN Phone Number: 01/22/2025, 3:28 PM  Clinical Narrative:     CM spoke to pts sister and there are still no family members that can take the patient after dc and provide the care he will need.  STAR program to work toward transfers with wheelchair. CM has ordered wheelchair through [facility] and it will be delivered to the room. Pt has disability so will need to work on ALF placement once he is more independent with transfers.  ICM following.  Expected Discharge Plan: Skilled Nursing Facility Barriers to Discharge: Insurance Authorization, Inadequate or no insurance, Active Substance Use - Placement, SNF Pending bed offer               Expected Discharge Plan and Services                                               Social Drivers of Health (SDOH) Interventions SDOH Screenings   Food Insecurity: No Food Insecurity (06/03/2024)  Housing: Patient Declined (05/04/2024)  Transportation Needs: Unmet Transportation Needs (06/03/2024)  Utilities: At Risk (06/03/2024)  Depression (PHQ2-9): Low Risk (06/08/2024)  Recent Concern: Depression (PHQ2-9) - High Risk (06/07/2024)  Financial Resource Strain: Medium Risk (01/20/2024)   Received from [facility]  Tobacco Use: High Risk (12/31/2024)  Health Literacy: Low Risk  (07/02/2022)   Received from [facility]    Readmission Risk Interventions     No data to display

## 2025-01-22 NOTE — Progress Notes (Addendum)
 STROKE TEAM PROGRESS NOTE   INTERIM HISTORY/SUBJECTIVE Patient is seen in his room with no family at the bedside.  He continues to be hemodynamically stable and afebrile.  He is working with PT and OT on a daily basis and will hopefully be discharged home with his sister.  CBC    Component Value Date/Time   WBC 5.4 01/16/2025 0006   RBC 3.45 (L) 01/16/2025 0006   HGB 11.7 (L) 01/16/2025 0006   HGB 14.4 01/14/2024 1043   HCT 33.6 (L) 01/16/2025 0006   HCT 42.6 01/14/2024 1043   PLT 182 01/16/2025 0006   PLT 220 01/14/2024 1043   MCV 97.4 01/16/2025 0006   MCV 101 (H) 01/14/2024 1043   MCV 99 01/06/2015 1505   MCH 33.9 01/16/2025 0006   MCHC 34.8 01/16/2025 0006   RDW 12.4 01/16/2025 0006   RDW 11.9 01/14/2024 1043   RDW 13.0 01/06/2015 1505   LYMPHSABS 0.9 12/27/2024 0705   LYMPHSABS 1.6 01/14/2024 1043   LYMPHSABS 2.6 01/06/2015 1505   MONOABS 0.4 12/27/2024 0705   MONOABS 0.3 01/06/2015 1505   EOSABS 0.0 12/27/2024 0705   EOSABS 0.1 01/14/2024 1043   EOSABS 0.1 01/06/2015 1505   BASOSABS 0.0 12/27/2024 0705   BASOSABS 0.0 01/14/2024 1043   BASOSABS 0.0 01/06/2015 1505    BMET    Component Value Date/Time   NA 135 01/16/2025 0006   NA 141 01/06/2015 1505   K 4.3 01/16/2025 0006   K 3.9 01/06/2015 1505   CL 102 01/16/2025 0006   CL 107 01/06/2015 1505   CO2 23 01/16/2025 0006   CO2 29 01/06/2015 1505   GLUCOSE 91 01/16/2025 0006   GLUCOSE 111 (H) 01/06/2015 1505   BUN 23 (H) 01/16/2025 0006   BUN 16 01/06/2015 1505   CREATININE 1.15 01/16/2025 0006   CREATININE 1.28 01/06/2015 1505   CALCIUM  9.0 01/16/2025 0006   CALCIUM  9.0 01/06/2015 1505   GFRNONAA >60 01/16/2025 0006   GFRNONAA >60 01/06/2015 1505   GFRNONAA >60 05/10/2013 0535    IMAGING past 24 hours No results found.   Vitals:   01/22/25 0000 01/22/25 0402 01/22/25 0829 01/22/25 1200  BP: 112/67 129/80 117/69 122/67  Pulse: (!) 55 (!) 49  (!) 44  Resp: 18 16 18 17   Temp: 98.6 F (37 C) 98.5  F (36.9 C) (!) 97.1 F (36.2 C) 98 F (36.7 C)  TempSrc: Oral Oral Oral Oral  SpO2: 98% 96% 97% 97%  Weight:      Height:        PHYSICAL EXAM General:  Alert, well-nourished, well-developed patient in no acute distress Psych: Smiling to provider appropriately  NEURO:  Mental Status: Patient follows simple commands, nods appropriately.  Speech/Language: Able to say no but no other verbal output  Cranial Nerves:  II: PERRL.  III, IV, VI: Extraocular movements intact VII: Right facial droop VIII: hearing intact to voice. XII: tongue is midline without fasciculations. Motor: LUE and LLE at least 4/5, barely lift right upper extremity today, able to lift RLE off bed 3/5.  Tone: is normal and bulk is normal Sensation- Intact to light touch bilaterally.  Coordination: Left finger-to-nose intact, right finger to nose not able to complete Gait- deferred   ASSESSMENT/PLAN  Roy [name] is a 59 y.o. male with history of HIV, hypertension, substance abuse and alcohol abuse admitted for acute onset right-sided weakness and aphasia.  He was given TNK to treat stroke and was found to  have occlusion of left ICA/MCA, and mechanical thrombectomy was successfully performed.  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 s/p TNK and IR with TICI 3, etiology: Likely large vessel occlusion in the setting of multiple risk factors Code Stroke CT head hyperdense left MCA ASPECTS 7 CTA head & neck left ICA occlusion in the neck, left MCA origin occluded S/p IR with TICI3  MRI acute left MCA territory infarct with confluent petechial hemorrhage, cytotoxic edema and mild regional mass effect with trace rightward midline shift 2D Echo EF 50 to 55% LDL 55 HgbA1c 5.8 UDS positive for cocaine, benzo and fentanyl  VTE prophylaxis - heparin  subcu No antithrombotic prior to admission, continue aspirin  81 mg daily Therapy recommendations:  SNF Disposition: Pending, difficult placement  Hypertension Home meds: Losartan  100 mg daily Stable Long-term BP goal normotensive  Hyperlipidemia Home meds: Rosuvastatin  20 mg daily, resumed in hospital LDL 55, goal < 70 Continue statin at discharge  Tobacco Abuse Patient smokes 0.25 packs per day for 40 years      Ready to quit? N/A Nicotine  replacement therapy provided  Cocaine abuse Patient uses cocaine UDS positive for Cocaine      Ready to quit? N/A TOC consult for cessation placed  Dysphagia Patient has post-stroke dysphagia, SLP consulted Now on dysphagia 1->3 and thin liquid Aspiration precautions Advance diet as tolerated  Other Stroke Risk Factors Alcohol abuse HIV, on HARRT therapy  Other Active Problems Mild thrombocytopenia, resolved  Hospital day # 26  Patient seen and examined by NP/APP with MD. MD to update note as needed.   Jorene Last, DNP, FNP-BC [facility] Pager: [phone]  ATTENDING NOTE: I reviewed above note and agree with the assessment and plan. Pt was seen and examined.   PT at bedside.  Patient sitting at bedside commode.  Per PT, patient was able to walk with assistant with PT/OT with walker.  Seems made significant improvement so far.  However still has difficulty for placement.  STAR program on board.  Continue current management.  For detailed assessment and plan, please refer to above as I have made changes wherever appropriate.   Ary Cummins, MD PhD Stroke Neurology 01/22/2025 6:49 PM

## 2025-01-22 NOTE — Progress Notes (Signed)
 Occupational Therapy Treatment Patient Details Name: Roy [name] MRN: [id number] DOB: February 03, 1966 Today's Date: 01/22/2025   History of present illness 59 yo M adm 12/27/24 from [facility]TOH rehab center after fall with Rt weakness, AMS. Pt with Lt ICA/MCA CVA s/p Lt ICA/MCA thrombectomy complicated by Rt CFA occlusion from closure device requiring exploration and endarterectomy with angioplasty. Negative pressure dressing Rt groin  PMHx: cocaine & ETOH abuse, HIV, bipolar disorder, CAD   OT comments  STAR OT/PT session: Patient continues to require min assist to get to EOB with patient using LLE and LUE to move RLE towards EOB.  Patient performed dressing seated on EOB with mod assist for UB/LB dressing with education on hemi technique and patient assisting with pulling up clothing while standing at right platform walker. Patient required max assist to donn shoes with toe lift added to right shoe. Patient was min assist with wheelchair mobility in hallway using LUE and LLE to propel and steer chair.  Patient asked to use [facility] before returning to bed with patient able to perform toilet hygiene with min assist while standing.  Patient to continue to be followed by acute OT with STAR program to address established goals.       If plan is discharge home, recommend the following:  A lot of help with walking and/or transfers;A lot of help with bathing/dressing/bathroom;Assistance with cooking/housework;Direct supervision/assist for medications management;Direct supervision/assist for financial management;Assist for transportation;Help with stairs or ramp for entrance   Equipment Recommendations  Other (comment) (TBD)    Recommendations for Other Services      Precautions / Restrictions Precautions Precautions: Fall;Other (comment) Recall of Precautions/Restrictions: Impaired Precaution/Restrictions Comments: foley, aphasia, Rt hemiparesis Restrictions Weight Bearing Restrictions Per Provider Order:  No       Mobility Bed Mobility Overal bed mobility: Needs Assistance Bed Mobility: Supine to Sit, Sit to Supine     Supine to sit: Min assist, HOB elevated Sit to supine: Min assist   General bed mobility comments: patient able to use LLE and LUE to move RLE towards EOB, assistance with trunk    Transfers Overall transfer level: Needs assistance Equipment used: Right platform walker Transfers: Sit to/from Stand Sit to Stand: Min assist, +2 safety/equipment, Mod assist           General transfer comment: toe lift added to right shoe and right platform walker for mobility and transfers with assistance to advance RLE     Balance Overall balance assessment: Needs assistance Sitting-balance support: Feet supported Sitting balance-Leahy Scale: Fair Sitting balance - Comments: supervision for safety on EOB   Standing balance support: Bilateral upper extremity supported Standing balance-Leahy Scale: Poor Standing balance comment: BUE support with platform walker, able to stand with one extremity support with platform walker to perform toilet hygiene                           ADL either performed or assessed with clinical judgement   ADL Overall ADL's : Needs assistance/impaired                 Upper Body Dressing : Moderate assistance;Sitting Upper Body Dressing Details (indicate cue type and reason): education on hemi technique for UB dressing with sweatshirt Lower Body Dressing: Moderate assistance;Maximal assistance;Sit to/from stand Lower Body Dressing Details (indicate cue type and reason): mod assist to donn pants with hemi technique and max assist for socks and shoes Toilet Transfer: Minimal assistance;BSC/3in1 Toilet Transfer Details (indicate  cue type and reason): platform walker utilized for standing and transfer Toileting- Architect and Hygiene: Minimal assistance;Sit to/from stand Toileting - Clothing Manipulation Details (indicate  cue type and reason): patient requiring min assist for toilet hygiene and balance while standing            Extremity/Trunk Assessment              Vision       Perception     Praxis     Communication Communication Communication: Impaired Factors Affecting Communication: Difficulty expressing self   Cognition Arousal: Alert Behavior During Therapy: Flat affect Cognition: Difficult to assess Difficult to assess due to: Impaired communication                             Following commands: Impaired Following commands impaired: Follows one step commands with increased time      Cueing   Cueing Techniques: Verbal cues, Gestural cues, Tactile cues  Exercises      Shoulder Instructions       General Comments toe lift added to right shoe with improvement with advancing RLE    Pertinent Vitals/ Pain       Pain Assessment Pain Assessment: Faces Faces Pain Scale: No hurt Pain Intervention(s): Monitored during session  Home Living                                          Prior Functioning/Environment              Frequency  Min 2X/week        Progress Toward Goals  OT Goals(current goals can now be found in the care plan section)  Progress towards OT goals: Progressing toward goals  Acute Rehab OT Goals Patient Stated Goal: patient unable OT Goal Formulation: Patient unable to participate in goal setting Time For Goal Achievement: 01/26/25 Potential to Achieve Goals: Good  Plan      Co-evaluation    PT/OT/SLP Co-Evaluation/Treatment: Yes Reason for Co-Treatment: Complexity of the patient's impairments (multi-system involvement);For patient/therapist safety PT goals addressed during session: Mobility/safety with mobility;Balance;Proper use of DME OT goals addressed during session: ADL's and self-care;Strengthening/ROM;Proper use of Adaptive equipment and DME      AM-PAC OT 6 Clicks Daily Activity      Outcome Measure   Help from another person eating meals?: A Little Help from another person taking care of personal grooming?: A Little Help from another person toileting, which includes using toliet, bedpan, or urinal?: A Little Help from another person bathing (including washing, rinsing, drying)?: A Lot Help from another person to put on and taking off regular upper body clothing?: A Lot Help from another person to put on and taking off regular lower body clothing?: A Lot 6 Click Score: 15    End of Session Equipment Utilized During Treatment: Gait belt;Rolling walker (2 wheels);Other (comment) (right platform and toe lift)  OT Visit Diagnosis: Unsteadiness on feet (R26.81);Other abnormalities of gait and mobility (R26.89);Muscle weakness (generalized) (M62.81);Hemiplegia and hemiparesis;Cognitive communication deficit (R41.841) Symptoms and signs involving cognitive functions: Cerebral infarction Hemiplegia - Right/Left: Right Hemiplegia - dominant/non-dominant: Dominant Hemiplegia - caused by: Cerebral infarction   Activity Tolerance Patient tolerated treatment well   Patient Left in bed;with call bell/phone within reach;with bed alarm set   Nurse Communication Mobility status  Time: 8759-8660 OT Time Calculation (min): 59 min  Charges: OT General Charges $OT Visit: 1 Visit OT Treatments $Self Care/Home Management : 23-37 mins  Dick Laine, OTA Acute Rehabilitation Services  Office [phone]   Jeb LITTIE Laine 01/22/2025, 2:21 PM

## 2025-01-22 NOTE — Progress Notes (Signed)
 Physical Therapy Treatment Patient Details Name: Roy [name] MRN: [id number] DOB: December 12, 1966 Today's Date: 01/22/2025   History of Present Illness 59 yo M adm 12/27/24 from ETOH rehab center after fall with Rt weakness, AMS. Pt with Lt ICA/MCA CVA s/p Lt ICA/MCA thrombectomy complicated by Rt CFA occlusion from closure device requiring exploration and endarterectomy with angioplasty. Negative pressure dressing Rt groin  PMHx: cocaine & ETOH abuse, HIV, bipolar disorder, CAD    PT Comments  STAR PT/OT Session: Pt continues to make slowed progress towards his goals. Pt up with OT and dressed on entry. Pt requiring total A for donning shoes, R shoe now has toe lift which provides some assist in dorsiflexion. Introduced wheelchair propulsion, pt requiring modA for navigation with propulsion using L UE and L LE,. Pt also worked on ambulation in [employer] with [id number] for support and for physical assist with advancement of L LE.  TOC has reached out to family and no one is able to provide 24 hour assist with level of assist pt needs. Given pt disability status, TOC team will pursue ALF placement. Wheelchair has been ordered for pt to be able to practice propulsion and navigation in room. PT/OT will work on modified independence with pivot transfers to wheelchair, and progression of dressing, toileting and self feeding in preparation to discharge to ALF.    If plan is discharge home, recommend the following: Two people to help with walking and/or transfers;A lot of help with bathing/dressing/bathroom;Assistance with feeding;Assistance with cooking/housework;Direct supervision/assist for financial management;Assist for transportation;Direct supervision/assist for medications management;Supervision due to cognitive status   Can travel by private vehicle     No  Equipment Recommendations  BSC/3in1;Wheelchair (measurements PT);Wheelchair cushion (measurements PT)       Precautions /  Restrictions Precautions Precautions: Fall;Other (comment) Recall of Precautions/Restrictions: Impaired Precaution/Restrictions Comments: foley, aphasia, Rt hemiparesis Restrictions Weight Bearing Restrictions Per Provider Order: No     Mobility  Bed Mobility Overal bed mobility: Needs Assistance Bed Mobility: Supine to Sit, Sit to Supine     Supine to sit: Min assist, HOB elevated Sit to supine: Min assist   General bed mobility comments: patient able to use LLE and LUE to move RLE towards EOB, assistance with trunk    Transfers Overall transfer level: Needs assistance Equipment used: Right platform walker Transfers: Sit to/from Stand Sit to Stand: Min assist, +2 safety/equipment, Mod assist           General transfer comment: toe lift added to right shoe and right platform walker for mobility and transfers with assistance to advance RLE    Ambulation/Gait Ambulation/Gait assistance: Mod assist, +2 safety/equipment Gait Distance (Feet): 15 Feet (+10) Assistive device: Right platform walker Gait Pattern/deviations: Step-to pattern, Decreased step length - right, Decreased dorsiflexion - right, Decreased weight shift to right, Narrow base of support Gait velocity: decreased Gait velocity interpretation: <1.31 ft/sec, indicative of household ambulator   General Gait Details: toe lift does help with keeping dorsiflexion but pt continues to need heavy modA for picking up R LE and progressing it forward, pt with increased knee hyperextension, PT attempting to limit with weight shift onto RLE       [employer] Wheelchair mobility: Yes Wheelchair propulsion: Left upper extremity, Left lower extremity Wheelchair parts: Needs assistance Distance: 25     Modified Rankin (Stroke Patients Only) Modified Rankin (Stroke Patients Only) Pre-Morbid Rankin Score: No symptoms Modified Rankin: Moderately severe disability     Balance Overall  balance assessment: Needs assistance Sitting-balance support: Feet supported Sitting balance-Leahy Scale: Fair Sitting balance - Comments: supervision for safety on EOB   Standing balance support: Bilateral upper extremity supported Standing balance-Leahy Scale: Poor Standing balance comment: BUE support with platform walker, able to stand with one extremity support with platform walker to perform toilet hygiene                            Communication Communication Communication: Impaired Factors Affecting Communication: Difficulty expressing self  Cognition Arousal: Alert Behavior During Therapy: Flat affect   PT - Cognitive impairments: Difficult to assess Difficult to assess due to: Impaired communication                     PT - Cognition Comments: able to nod yes/no with ?accuracy Following commands: Impaired Following commands impaired: Follows one step commands with increased time    Cueing Cueing Techniques: Verbal cues, Gestural cues, Tactile cues  Exercises      General Comments General comments (skin integrity, edema, etc.): toe lift added to right shoe with improvement with advancing RLE      Pertinent Vitals/Pain Pain Assessment Pain Assessment: Faces Faces Pain Scale: No hurt Pain Intervention(s): Monitored during session     PT Goals (current goals can now be found in the care plan section) Acute Rehab PT Goals PT Goal Formulation: Patient unable to participate in goal setting Time For Goal Achievement: 01/26/25 Potential to Achieve Goals: Good Progress towards PT goals: Progressing toward goals    Frequency    Min 3X/week           Co-evaluation PT/OT/SLP Co-Evaluation/Treatment: Yes Reason for Co-Treatment: Complexity of the patient's impairments (multi-system involvement);For patient/therapist safety PT goals addressed during session: Mobility/safety with mobility;Balance;Proper use of DME OT goals addressed during  session: ADL's and self-care;Strengthening/ROM;Proper use of Adaptive equipment and DME      AM-PAC PT 6 Clicks Mobility   Outcome Measure  Help needed turning from your back to your side while in a flat bed without using bedrails?: A Little Help needed moving from lying on your back to sitting on the side of a flat bed without using bedrails?: A Little Help needed moving to and from a bed to a chair (including a wheelchair)?: A Lot Help needed standing up from a chair using your arms (e.g., wheelchair or bedside chair)?: A Lot Help needed to walk in hospital room?: Total Help needed climbing 3-5 steps with a railing? : Total 6 Click Score: 12    End of Session Equipment Utilized During Treatment: Gait belt Activity Tolerance: Patient tolerated treatment well Patient left: in chair (with OT in room) Nurse Communication: Mobility status PT Visit Diagnosis: Other abnormalities of gait and mobility (R26.89);Difficulty in walking, not elsewhere classified (R26.2);Hemiplegia and hemiparesis;Other symptoms and signs involving the nervous system (R29.898) Hemiplegia - Right/Left: Right Hemiplegia - dominant/non-dominant: Dominant Hemiplegia - caused by: Cerebral infarction     Time: 1240-1330 PT Time Calculation (min) (ACUTE ONLY): 50 min  Charges:    $Gait Training: 8-22 mins $Wheel Chair Management: 8-22 mins PT General Charges $$ ACUTE PT VISIT: 1 Visit                     Roy [name] B. Fleeta Lapidus PT, DPT Acute Rehabilitation Services Please use secure chat or  Call Office [phone]    Roy [name] Fleeta [facility] 01/22/2025, 3:38 PM

## 2025-01-28 ENCOUNTER — Ambulatory Visit: Payer: MEDICAID
